# Patient Record
Sex: Female | Born: 1950 | Race: White | Hispanic: No | Smoking: Former smoker
Health system: Southern US, Community
[De-identification: ages and names within clinical notes are randomized; demographics above are authoritative.]

## PROBLEM LIST (undated history)

## (undated) DIAGNOSIS — M1990 Unspecified osteoarthritis, unspecified site: Secondary | ICD-10-CM

## (undated) DIAGNOSIS — L03116 Cellulitis of left lower limb: Secondary | ICD-10-CM

## (undated) DIAGNOSIS — L03115 Cellulitis of right lower limb: Secondary | ICD-10-CM

## (undated) DIAGNOSIS — F32A Depression, unspecified: Secondary | ICD-10-CM

## (undated) DIAGNOSIS — Z1371 Encounter for nonprocreative screening for genetic disease carrier status: Secondary | ICD-10-CM

## (undated) DIAGNOSIS — M75100 Unspecified rotator cuff tear or rupture of unspecified shoulder, not specified as traumatic: Secondary | ICD-10-CM

## (undated) DIAGNOSIS — E785 Hyperlipidemia, unspecified: Secondary | ICD-10-CM

## (undated) DIAGNOSIS — I509 Heart failure, unspecified: Secondary | ICD-10-CM

## (undated) DIAGNOSIS — M4800 Spinal stenosis, site unspecified: Secondary | ICD-10-CM

## (undated) DIAGNOSIS — C801 Malignant (primary) neoplasm, unspecified: Secondary | ICD-10-CM

## (undated) DIAGNOSIS — I10 Essential (primary) hypertension: Secondary | ICD-10-CM

## (undated) DIAGNOSIS — E119 Type 2 diabetes mellitus without complications: Secondary | ICD-10-CM

## (undated) DIAGNOSIS — R519 Headache, unspecified: Secondary | ICD-10-CM

## (undated) DIAGNOSIS — I2510 Atherosclerotic heart disease of native coronary artery without angina pectoris: Secondary | ICD-10-CM

## (undated) DIAGNOSIS — Z803 Family history of malignant neoplasm of breast: Secondary | ICD-10-CM

## (undated) DIAGNOSIS — Z972 Presence of dental prosthetic device (complete) (partial): Secondary | ICD-10-CM

## (undated) DIAGNOSIS — M797 Fibromyalgia: Secondary | ICD-10-CM

## (undated) DIAGNOSIS — F419 Anxiety disorder, unspecified: Secondary | ICD-10-CM

## (undated) DIAGNOSIS — Z8041 Family history of malignant neoplasm of ovary: Secondary | ICD-10-CM

## (undated) HISTORY — PX: HERNIA REPAIR: SHX51

## (undated) HISTORY — DX: Malignant (primary) neoplasm, unspecified: C80.1

## (undated) HISTORY — PX: ABLATION SAPHENOUS VEIN W/ RFA: SUR11

## (undated) HISTORY — PX: DILATION AND CURETTAGE OF UTERUS: SHX78

## (undated) HISTORY — DX: Encounter for nonprocreative screening for genetic disease carrier status: Z13.71

## (undated) HISTORY — DX: Essential (primary) hypertension: I10

## (undated) HISTORY — DX: Hyperlipidemia, unspecified: E78.5

## (undated) HISTORY — DX: Family history of malignant neoplasm of ovary: Z80.41

## (undated) HISTORY — DX: Family history of malignant neoplasm of breast: Z80.3

## (undated) HISTORY — DX: Heart failure, unspecified: I50.9

## (undated) HISTORY — PX: CORONARY ANGIOPLASTY: SHX604

## (undated) HISTORY — PX: COLON SURGERY: SHX602

## (undated) HISTORY — DX: Spinal stenosis, site unspecified: M48.00

---

## 2004-01-04 HISTORY — PX: ANAL FISSURECTOMY: SUR608

## 2005-11-21 ENCOUNTER — Ambulatory Visit: Payer: Self-pay | Admitting: Family Medicine

## 2008-05-18 ENCOUNTER — Ambulatory Visit: Payer: Self-pay | Admitting: Family Medicine

## 2008-05-31 ENCOUNTER — Ambulatory Visit: Payer: Self-pay | Admitting: Family Medicine

## 2008-06-15 ENCOUNTER — Ambulatory Visit: Payer: Self-pay | Admitting: Family Medicine

## 2008-07-09 ENCOUNTER — Ambulatory Visit: Payer: Self-pay | Admitting: General Surgery

## 2008-10-11 ENCOUNTER — Inpatient Hospital Stay: Payer: Self-pay | Admitting: Internal Medicine

## 2008-12-06 ENCOUNTER — Ambulatory Visit: Payer: Self-pay | Admitting: Family Medicine

## 2010-06-12 ENCOUNTER — Ambulatory Visit: Payer: Self-pay | Admitting: Family Medicine

## 2010-07-04 ENCOUNTER — Inpatient Hospital Stay: Payer: Self-pay | Admitting: Internal Medicine

## 2010-07-10 ENCOUNTER — Ambulatory Visit: Payer: Self-pay | Admitting: Internal Medicine

## 2010-07-13 ENCOUNTER — Ambulatory Visit: Payer: Self-pay

## 2010-07-20 ENCOUNTER — Ambulatory Visit: Payer: Self-pay | Admitting: Internal Medicine

## 2011-11-28 ENCOUNTER — Ambulatory Visit: Payer: Self-pay | Admitting: Family Medicine

## 2013-08-20 ENCOUNTER — Ambulatory Visit: Payer: Self-pay | Admitting: Unknown Physician Specialty

## 2013-09-02 ENCOUNTER — Ambulatory Visit: Payer: Self-pay | Admitting: Orthopedic Surgery

## 2013-09-11 ENCOUNTER — Ambulatory Visit: Payer: Self-pay | Admitting: Orthopedic Surgery

## 2013-11-04 ENCOUNTER — Ambulatory Visit: Payer: Self-pay | Admitting: Cardiology

## 2013-11-04 HISTORY — PX: OTHER SURGICAL HISTORY: SHX169

## 2013-11-04 LAB — CK TOTAL AND CKMB (NOT AT ARMC)
CK, Total: 62 U/L (ref 21–215)
CK-MB: 1.2 ng/mL (ref 0.5–3.6)

## 2013-11-05 LAB — BASIC METABOLIC PANEL
Anion Gap: 3 — ABNORMAL LOW (ref 7–16)
BUN: 10 mg/dL (ref 7–18)
Calcium, Total: 8.7 mg/dL (ref 8.5–10.1)
Chloride: 101 mmol/L (ref 98–107)
Co2: 32 mmol/L (ref 21–32)
Creatinine: 0.55 mg/dL — ABNORMAL LOW (ref 0.60–1.30)
EGFR (African American): >60
EGFR (Non-African Amer.): >60
Glucose: 145 mg/dL — ABNORMAL HIGH (ref 65–99)
Osmolality: 274 (ref 275–301)
Potassium: 4 mmol/L (ref 3.5–5.1)
Sodium: 136 mmol/L (ref 136–145)

## 2013-12-31 ENCOUNTER — Ambulatory Visit: Payer: Self-pay | Admitting: Unknown Physician Specialty

## 2014-01-06 HISTORY — PX: MEDIAL PARTIAL KNEE REPLACEMENT: SHX5965

## 2014-04-06 DIAGNOSIS — M069 Rheumatoid arthritis, unspecified: Secondary | ICD-10-CM | POA: Insufficient documentation

## 2014-04-06 DIAGNOSIS — M059 Rheumatoid arthritis with rheumatoid factor, unspecified: Secondary | ICD-10-CM | POA: Insufficient documentation

## 2014-04-08 DIAGNOSIS — I878 Other specified disorders of veins: Secondary | ICD-10-CM | POA: Insufficient documentation

## 2014-04-08 DIAGNOSIS — G43909 Migraine, unspecified, not intractable, without status migrainosus: Secondary | ICD-10-CM | POA: Insufficient documentation

## 2014-04-08 DIAGNOSIS — E669 Obesity, unspecified: Secondary | ICD-10-CM | POA: Insufficient documentation

## 2014-04-08 DIAGNOSIS — I2510 Atherosclerotic heart disease of native coronary artery without angina pectoris: Secondary | ICD-10-CM | POA: Insufficient documentation

## 2014-04-08 DIAGNOSIS — K279 Peptic ulcer, site unspecified, unspecified as acute or chronic, without hemorrhage or perforation: Secondary | ICD-10-CM | POA: Insufficient documentation

## 2014-04-08 DIAGNOSIS — J449 Chronic obstructive pulmonary disease, unspecified: Secondary | ICD-10-CM | POA: Insufficient documentation

## 2014-04-08 DIAGNOSIS — E118 Type 2 diabetes mellitus with unspecified complications: Secondary | ICD-10-CM | POA: Insufficient documentation

## 2014-04-08 DIAGNOSIS — M1990 Unspecified osteoarthritis, unspecified site: Secondary | ICD-10-CM | POA: Insufficient documentation

## 2014-04-08 DIAGNOSIS — E119 Type 2 diabetes mellitus without complications: Secondary | ICD-10-CM | POA: Insufficient documentation

## 2014-07-26 DIAGNOSIS — Z79899 Other long term (current) drug therapy: Secondary | ICD-10-CM | POA: Insufficient documentation

## 2014-08-30 ENCOUNTER — Inpatient Hospital Stay: Payer: Self-pay | Admitting: Internal Medicine

## 2014-08-30 LAB — CREATININE, SERUM
Creatinine: 0.73 mg/dL (ref 0.60–1.30)
EGFR (African American): >60
EGFR (Non-African Amer.): >60

## 2014-08-31 LAB — CBC WITH DIFFERENTIAL/PLATELET
Basophil #: 0 10*3/uL (ref 0.0–0.1)
Basophil %: 0.8 %
Eosinophil #: 0.2 10*3/uL (ref 0.0–0.7)
Eosinophil %: 4 %
HCT: 33.9 % — ABNORMAL LOW (ref 35.0–47.0)
HGB: 11 g/dL — ABNORMAL LOW (ref 12.0–16.0)
Lymphocyte #: 1.4 10*3/uL (ref 1.0–3.6)
Lymphocyte %: 25.2 %
MCH: 28 pg (ref 26.0–34.0)
MCHC: 32.5 g/dL (ref 32.0–36.0)
MCV: 86 fL (ref 80–100)
Monocyte #: 0.5 x10 3/mm (ref 0.2–0.9)
Monocyte %: 8.6 %
Neutrophil #: 3.5 10*3/uL (ref 1.4–6.5)
Neutrophil %: 61.4 %
Platelet: 230 10*3/uL (ref 150–440)
RBC: 3.94 10*6/uL (ref 3.80–5.20)
RDW: 16.3 % — ABNORMAL HIGH (ref 11.5–14.5)
WBC: 5.6 10*3/uL (ref 3.6–11.0)

## 2014-08-31 LAB — BASIC METABOLIC PANEL
Anion Gap: 6 — ABNORMAL LOW (ref 7–16)
BUN: 12 mg/dL (ref 7–18)
Calcium, Total: 8.2 mg/dL — ABNORMAL LOW (ref 8.5–10.1)
Chloride: 104 mmol/L (ref 98–107)
Co2: 28 mmol/L (ref 21–32)
Creatinine: 0.63 mg/dL (ref 0.60–1.30)
EGFR (African American): >60
EGFR (Non-African Amer.): >60
Glucose: 116 mg/dL — ABNORMAL HIGH (ref 65–99)
Osmolality: 276 (ref 275–301)
Potassium: 4 mmol/L (ref 3.5–5.1)
Sodium: 138 mmol/L (ref 136–145)

## 2014-09-04 LAB — CULTURE, BLOOD (SINGLE)

## 2014-10-05 ENCOUNTER — Encounter: Payer: Self-pay | Admitting: General Surgery

## 2014-10-09 LAB — WOUND AEROBIC CULTURE

## 2014-10-20 ENCOUNTER — Ambulatory Visit: Payer: Self-pay | Admitting: Family Medicine

## 2014-11-02 ENCOUNTER — Encounter: Payer: Self-pay | Admitting: General Surgery

## 2015-03-25 NOTE — Discharge Summary (Signed)
PATIENT NAME:  [name] [name], [name] [name] MR#:  [id number] DATE OF BIRTH:  [birth year]  DATE OF ADMISSION:  11/04/2013 DATE OF DISCHARGE: 11/05/2013   FINAL DIAGNOSES:  1.  Coronary artery disease.  2.  Hyperlipidemia.  3.  Diabetes.  4.  Rheumatoid arthritis.   DISCHARGE MEDICATIONS: Aspirin 81 mg daily, clopidogrel 75 mg daily, atorvastatin 10 mg at bedtime, citalopram 20 mg daily, methotrexate 2.5 mg, Zyrtec 10 mg daily, glipizide 2.5 mg daily and folic acid 1 mg daily.   PROCEDURES:  1.  Cardiac catheterization with selective coronary arteriography 11/04/2013.  2.  Percutaneous coronary intervention with coronary stent 11/04/2013.   HISTORY OF PRESENT ILLNESS: Please see admission H and [place] COURSE: The patient underwent elective cardiac catheterization on 11/04/2013. Coronary arteriography revealed an 80% stenosis in the proximal segment of the right coronary artery. The patient underwent percutaneous coronary intervention receiving a 3.5 x 18 mm bare metal stent. Dilatation was performed with 4.0 x 15 mm noncompliant balloon. The patient had excellent angiographic result. She was admitted overnight to the CCU where she had an uncomplicated hospital course without chest pain. Postoperative CPK and MB were 62 and 1.2, respectively. BUN and creatinine on the morning of discharge was 10 and 0.55, respectively. The patient was ambulating without difficulty and was discharged home in stable condition. She is scheduled to see me in followup in 1 to 2 weeks.   ____________________________ Isaias Cowman, MD ap:aw D: 11/05/2013 07:29:50 ET T: 11/05/2013 07:37:22 ET JOB#: [id number]  cc: Isaias Cowman, MD, <Dictator> Isaias Cowman MD ELECTRONICALLY SIGNED 11/20/2013 8:51

## 2015-03-26 NOTE — Discharge Summary (Signed)
PATIENT NAME:  [name] [name], VASCONEZ MR#:  [id number] DATE OF BIRTH:  Jan 15, 1951  DATE OF ADMISSION:  08/30/2014 DATE OF DISCHARGE:  09/01/2014  DISCHARGE DIAGNOSIS: Left lower extremity cellulitis, improving.   SECONDARY DIAGNOSES: 1. Diabetes.  2. Coronary artery disease, status post stenting.  3. Hyperlipidemia.  4. Rheumatoid arthritis.  5. Depression.  6. Peripheral arterial disease.  7. Chronic lower extremity edema.  8. Bladder incontinence.   CONSULTATIONS: Vascular Surgery, Dr. Leotis Pain.   PROCEDURES AND RADIOLOGY: Left lower extremity Doppler on the 28th of September showed no evidence of deep vein thrombosis.   MAJOR LABORATORY PANEL: Blood culture x 1 was negative.   HISTORY AND SHORT HOSPITAL COURSE: The patient is a 64 year old female with the above-mentioned medical problems, who was admitted for left lower extremity cellulitis. Please see admitting Dr's dictated history and physical for further details. The patient supposedly failed outpatient antibiotics and was switched to intravenous antibiotics while in the hospital. Vascular surgery consultation was obtained with Dr. Leotis Pain as there was some concern for underlying peripheral vascular disease. The patient's venous stasis was thought to be chronic per vascular surgery. She certainly had lymphedema pump at home, which was in use. The patient was also encouraged for weight loss, leg elevation and activity, which all could benefit.  The patient was also instructed to consider using Ulna boots and lymphedema pump. She was recommended to get outpatient work-up for arterial studies with vascular surgery. Her cellulitis was improving and was feeling much better by September 30 and was discharged home in stable condition.   PHYSICAL EXAMINATION: VITAL SIGNS: On the date of discharge, her vital signs are as follows: Temperature 97.9, heart rate 67 per minute, respirations 17 per minute, blood pressure 124/73. She was saturating 93% on  room air. Pertinent physical examination on the date of discharge:  CARDIOVASCULAR: S1, S2 normal. No murmurs, rubs, or gallops.  LUNGS: Clear to auscultation bilaterally. No wheezing, rales, rhonchi, or crepitation.  ABDOMEN: Soft, benign.  NEUROLOGIC: Nonfocal examination. Her left lower extremity had minimal erythema below mid shin. No obvious draining ulcer. No obvious draining area or abscess. All other physical examination remained at the baseline.   DISCHARGE MEDICATIONS: 1. Acetaminophen/hydrocodone 325/5 mg 1 tablet every 6 hours as needed.  2. Citalopram 20 mg 2 tablets p.o. daily.  3. Zyrtec 10 mg p.o. daily. 4. Glipizide 2.5 mg p.o. daily. 5. Folic acid 1 mg  p.o. daily.  6. Aspirin 81 mg p.o. daily. 7.  Ramicade intravenous as scheduled and instructed by her rheumatologist.  8. Methotrexate 2.5 mg p.o. once a week on every Wednesday.  9. Levaquin 500 mg p.o. daily for a total of 10 days. 10. Doxycycline 100 mg tablet orally twice a day for 10 days.   DISCHARGE DIET: Low sodium.   Please note, originally doxycycline was prescribed 150 mg, but her insurance will only approve 400, so the prescription was changed.   DISCHARGE ACTIVITY: As tolerated.   DISCHARGE INSTRUCTIONS AND FOLLOWUP. The patient was instructed to follow up with her primary care physician, Dr. Margarita Rana in 2 to 4 weeks. She was instructed to follow up with infectious disease, Dr. [name] Prows, in 1 to 2 weeks.   TOTAL TIME DISCHARGING THIS PATIENT: 55 minutes.    ____________________________ Lucina Mellow. Manuella Ghazi, MD vss:hh D: 09/02/2014 23:38:36 ET T: 09/03/2014 00:11:58 ET JOB#: [id number]  cc: Chelsae Zanella S. Manuella Ghazi, MD, <Dictator> Cheral Marker. Ola Spurr, MD Jerrell Belfast, MD Algernon Huxley, MD Mylez Venable Chauncey Cruel  [facility] MD ELECTRONICALLY SIGNED 09/07/2014 16:00

## 2015-03-26 NOTE — H&P (Signed)
PATIENT NAME:  [name] [name], CAPEK MR#:  [id number] DATE OF BIRTH:  01/01/1951  DATE OF ADMISSION:  08/30/2014  PRIMARY CARE PHYSICIAN: Dr. Venia Minks.   CHIEF COMPLAINT: Failed outpatient treatment for left lower extremity cellulitis.   HISTORY OF PRESENT ILLNESS:  A 64 year old female with diabetes, hyperlipidemia, PAD, rheumatoid arthritis, who presents with the above complaint. The patient was sent from her primary care physician because she failed outpatient treatment for her cellulitis. She has been treated for the past 3 weeks on Augmentin  and doxycycline and the cellulitis is not improving. She denies any fever or chills.   PAST MEDICAL HISTORY:   1.  Diabetes.   2.  CAD with stent.  3.  Hyperlipidemia.  4.  History of rheumatoid arthritis.   5.  Depression.  6.  Bladder incontinence.  7.  History of PAD.  8.  Chronic lower extremity edema.    MEDICATIONS:  1.  Remicade IV.  2.  Methotrexate 2.5 daily.  3.  Celexa 40 mg daily.  4.  Folic acid 1 mg daily.  5.  Vesicare 5 mg daily.   6.  Glipizide 2.5 daily.  7.  Aspirin 81 mg daily.    REVIEW OF SYSTEMS:    CONSTITUTIONAL:  No fever, chills, weight loss or gain.  HEENT: No blurred or double vision. No tinnitus, postnasal drip, redness of oropharynx  RESPIRATORY:  No cough, wheezing, hemoptysis, COPD.   CARDIOVASCULAR:  No chest pain, orthopnea,  GASTROINTESTINAL: No nausea, vomiting, diarrhea, abdominal pain, melena, or ulcers.   GENITOURINARY:  No dysuria, hematuria.   ENDOCRINE: No polyuria, polydipsia.  No heat or cold intolerance.  HEMATOLOGIC AND LYMPHATIC: No anemia, easy bruising, bleeding.  SKIN: She has got a cellulitis of her lower extremity.   MUSCULOSKELETAL: Positive RA. No gout.  NEUROLOGIC: No history of CVA, TIA, or seizures.  PSYCHIATRIC: No history of anxiety. Positive depression.   ALLERGIES:  1.  SULFA CAUSES HIVES.  2.  LATEX CAUSES ITCHING.   SOCIAL HISTORY: The patient quit smoking about 15 years  ago. No alcohol or IV drug use.   FAMILY HISTORY: Positive for hypertension, dementia, COPD, cancer, brain aneurysm.   PAST SURGICAL HISTORY:  1.  Partial knee in February of 2015.  2.  Cardiac stent in December 2014.   PHYSICAL EXAMINATION:  VITAL SIGNS: Temperature 97.9, pulse 85, respirations 18, blood pressure 140/83, 97% on room air.  GENERAL: The patient is alert, oriented, not in acute distress.  HEENT: Head is atraumatic.  Pupils are reactive.  Sclerae are anicteric.  Mucus membranes are  moist. Oropharynx is clear.  NECK: Supple without JVD, carotid bruits,  CARDIOVASCULAR:  Regular rate and rhythm.   No murmurs, gallops, rubs.  PMI is not displaced.   LUNGS: Clear to auscultation without crackles, rales, rhonchi, or wheezing. Normal to percussion.  ABDOMEN:  Bowel sounds positive. Nontender, nondistended. No hepatosplenomegaly.  EXTREMITIES: She has got 3 + pitting edema in the left lower extremity.  SKIN: The patient has a large area of cellulitis left lower extremity with an ulcer about 2 cm x 2 cm on her shin without any drainage. Her left leg is tender to touch and is warm and erythematous.  NEUROLOGIC:  Cranial nerves II-XII are intact. There are no focal deficits.   LABORATORY DATA: No laboratories.   ASSESSMENT AND PLAN:  This is a 64 year old female who failed outpatient treatment on Augmentin and doxycycline for left lower extremity cellulitis, who was admitted for IV  antibiotics and further evaluation   1.  Left lower extremity cellulitis. The patient will be placed on vancomycin and Zosyn. We will continue to elevate her leg.  I am concerned that she could have a component of PAD, so I have asked Dr. Delana Meyer to see the patient in consultation. She will also have a lower extremity Doppler to rule out a DVT.   2.  Diabetes. She will continue her glipizide, ADA diet, and sliding scale insulin.  3.  History of rheumatoid arthritis.  We will continue her daily methotrexate.  We will continue her folic acid.   4.  Depression. Continue Celexa.   5.  History of coronary artery disease.  We will continue on aspirin. Unclear why the patient is not on a beta blocker or cholesterol medication, this can be evaluated while the patient is here or deferred as outpatient.   CODE STATUS: The patient is full code.    TIME SPENT:  Approximately 45 minutes.     ____________________________ Donell Beers. Benjie Karvonen, MD spm:bu D: 08/30/2014 15:00:02 ET T: 08/30/2014 15:49:24 ET JOB#: [id number]  cc: Darria Corvera P. Benjie Karvonen, MD, <Dictator> Jerrell Belfast, MD Donell Beers Ashon Rosenberg MD ELECTRONICALLY SIGNED 08/30/2014 17:10

## 2015-03-26 NOTE — Consult Note (Signed)
CHIEF COMPLAINT and HISTORY:  Subjective/Chief Complaint left leg cellulitis/ulcer   History of Present Illness Patient presents to the hospital for recurrent ulceration and cellulitis of the LLE.  She has had problems with chronic pain and swelling in her leg, and has had multiple previous episodes of cellulitis to the left leg.  She has venous disease on the right, but no ulceration or infection.  Has undergone venous intervention bilaterally previously and has seen Dr. Delana Meyer, although not in some time.  Not clear if she has had any arterial evaluations.  Has DM and RA.  This ulcer and infection started after a treatment with Remicade.  Started only 24 hours after that infusion.  Has failed outpatient therapy over several weeks, so now in the hospital for IV ABx.  Ulcer is on anterior shin/lower calf area.  Redness is circumferential throughout the calf and ankle area.  Is painful, and has not improved.  Tried [employer] as outpatient, but they were painful and she had to remove them.   PAST MEDICAL/SURGICAL HISTORY:  Past Medical History:   Fibromyalgia:    Hyperlipidemia:    Diabetes Mellitus, Type II (NIDD):    Depression:    Hypertension:    cellulitis:    Rheumatoid Arthritis:   ALLERGIES:  Allergies:  Sulfa drugs: Rash  HOME MEDICATIONS:  Home Medications: Medication Instructions Status  aspirin 81 mg oral tablet, chewable 1 tab(s) orally once a day Active  citalopram tablet 20 mg 2 tab(s)  once a day  Active  methotrexate 2.5 mg oral tablet  orally once a day Active  ZyrTEC 10 mg oral tablet 1 tab(s) orally once a day Active  glipiZIDE extended release 2.5 mg oral tablet, extended release 1 tab(s) orally once a day Active  folic acid 1 mg oral tablet 1 tab(s) orally once a day Active  Remicade  intravenous  Active  acetaminophen-hydrocodone 325 mg-5 mg oral tablet 1  orally every 6 hours as needed   Active  glucometer  Active  glucos strips Check blood sugar at least  3 times daily before meals Active   Family and Social History:  Family History Non-Contributory   Social History negative tobacco, negative ETOH, negative Illicit drugs   Place of Living Home   Review of Systems:  Subjective/Chief Complaint skin changes as above leg swelling chronic. No TIA/stroke/seizure No heat or cold intolerance No dysuria/hematuria No blurry or double vision No tinnitus or ear pain   Fever/Chills Yes   Cough No   Sputum No   Abdominal Pain No   Diarrhea No   Constipation No   Nausea/Vomiting No   SOB/DOE No   Chest Pain No   Telemetry Reviewed NSR   Dysuria No   Tolerating PT Yes   Tolerating Diet Yes   Medications/Allergies Reviewed Medications/Allergies reviewed   Physical Exam:  GEN well developed, well nourished, obese   HEENT pink conjunctivae, hearing intact to voice, moist oral mucosa   NECK No masses  trachea midline   RESP normal resp effort  no use of accessory muscles   CARD regular rate  no JVD   VASCULAR ACCESS none   ABD denies tenderness  soft   GU no superpubic tenderness   LYMPH negative neck, negative axillae   EXTR negative cyanosis/clubbing, positive edema, stasis changes present bilaterally and moderate to severe.  RLE swelling 1-2+, LLE swellilng 2-3+.  Erythema LLE and ulcer as below.  Pedal pulses 2+ DP on right, 1-2+ DP on left.  Right PT 1+, left PT trace.   SKIN positive ulcers, 2 cm round ulcer left anterior shin/calf area.  Surrounding erythema   NEURO cranial nerves intact, negative tremor, motor/sensory function intact   PSYCH alert, A+O to time, place, person   LABS:  Laboratory Results: LabObservation:    28-Sep-15 16:00, [name] Color Flow Doppler Low Extrem Left (Leg)  OBSERVATION   Reason for Test Edema  Routine Chem:    28-Sep-15 14:14, Creatinine Serum  Creatinine (comp) 0.73  eGFR (African American) >60  eGFR (Non-African American) >60  eGFR values <74m/min/1.73 m2 may be an  indication of chronic  kidney disease (CKD).  Calculated eGFR, using the MRDR Study equation, is useful in   patients with stable renal function.  The eGFR calculation will not be reliable in acutely ill patients  when serum creatinine is changing rapidly. It is not useful in  patients on dialysis. The eGFR calculation may not be applicable  to patients at the low and high extremes of body sizes, pregnant  women, and vetetarians.   RADIOLOGY:  Radiology Results: LabUnknown:    01-Oct-14 16:47, KDRBILATKNEE  KDRBILATKNEE  REASON FOR EXAM:    Knee pain  COMMENTS:       PROCEDURE: KDR - KDXR BILATERAL AP/LAT KNEES  - Sep 02 2013  4:47PM     RESULT: Lateral standing views of both knees reveal narrowing of the   medial joint compartments. There is very mild degenerative change of the   patellofemoral compartments. No joint effusion is evident. There is no   acute fracture.    IMPRESSION:  There are degenerative changes of the medial joint   compartment and to a lesser extent of the patellofemoral joint   compartment bilaterally.     Dictation Site: 5    Verified By: DAVID A. JMartinique M.D., MD  PACS Image    01-Oct-14 16:47, KDRBILATKNEESTA  KDRBILATKNEESTA  REASON FOR EXAM:    Knee pain  COMMENTS:       PROCEDURE: KDR - KDXR BILATERAL KNEES STANDING  - Sep 02 2013  4:47PM     RESULT: Standing AP and PA flexion views of the knees reveal the bones to   be reasonably well mineralized. There is narrowing ofthe medial joint   compartments bilaterally. There is beaking of the tibial spines   bilaterally. There is no evidence of an acute fracture.    IMPRESSION:  There is moderate degenerative change of both knees   predominantly involving the medial joint compartments.     Dictation Site: 5    Verified By: DAVID A. JMartinique M.D., MD  PACS Image    10-Oct-14 09:12, MRI Knee Left Without Contrast  PACS Image    29-Jan-15 10:20, CT Knee Left Without Contrast  PACS Image  MRI:     10-Oct-14 09:12, MRI Knee Left Without Contrast  MRI Knee Left Without Contrast  REASON FOR EXAM:    Left Knee Pain Eval Stress Fx  COMMENTS:       PROCEDURE: MMR - MMR KNEE LT WO CONTRAST  - Sep 11 2013  9:12AM     RESULT: History: Left knee pain    Comparison: None    Technique: Multiplanar and multisequence MRI of the right  knee was   performed without intravenous contrast.    FINDINGS:    MENISCI  Medial: There is a radial tear of the body of the medial meniscus.     Lateral: There is a tiny radial  tear the free edge of the body of the   lateral meniscus.[name]    LIGAMENTS    Cruciates: ACL Intact. PCL Intact.    Collaterals: MCL Intact. Lateral collateral ligament complex Intact.    CARTILAGE    Patellofemoral: There is partial thickness cartilage loss of the patellar   apex..  Medial compartment: There is full-thickness cartilage loss of the medial   femoral condyle and medial tibial plateau with subchondral reactive   changes. There is marginal osteophytosis..    Lateral compartment: Intact.    BONES: No marrow signal abnormality.    JOINT: There is a small joint effusion. Normal Hoffa's fat-pad. No plical   thickening.    EXTENSOR MECHANISM: Intact.    POPLITEAL FOSSA:  Popliteus intact. There is a moderate size complex   Baker's cyst with intermediate signal material within the Baker's cyst     which may reflect synovitis versus loose bodies. A component of the   Baker's cyst extends more medially adjacent to the popliteal vessels.    IMPRESSION:      1. There is a radial tear of the body of the medial meniscus.     2. There is a tiny radial tear the free edge of the body of the lateral   meniscus.    3. Cartilage abnormalities as described above.    4. Complex Baker's cyst.      Verified By: Jennette Banker, M.D., MD  CT:    29-Jan-15 10:20, CT Knee Left Without Contrast  CT Knee Left Without Contrast  REASON FOR EXAM:    MAKO   Osteoarthritis  COMMENTS:       PROCEDURE: CT  - CT KNEE LEFT WO  - Dec 31 2013 10:20AM     CLINICAL DATA:  Osteoarthritis. Surgical planning CT of the knee.  [place]ako protocol.    EXAM:  CT OF THE LEFT KNEE WITHOUT CONTRAST    TECHNIQUE:  Multidetector CT imaging was performed according to the MAKO  protocol. Multiplanar CT image reconstructions were also generated.  COMPARISON:  09/11/2013.    FINDINGS:  Body habitus reduces diagnostic sensitivity and specificity. Both  bone window images the left hip demonstrate mild degenerative loss  of articular space. At bone window images the left ankle show mild  midfoot degenerative arthropathy in subcutaneous edema overlying the  malleoli. Type 1 accessory navicular.    Moderate joint effusion. Markedly severe loss of articular space in  the medial compartment was subcortical sclerosis and degenerative  subcortical lesions. Tricompartmental spurring. Moderate  degenerative loss of articular cartilage space in the lateral  compartment and patellofemoral joint  A moderate-sized Baker's cyst is present     IMPRESSION:  1. Severe osteoarthritis of the left knee. Moderate knee joint  effusion.  2. Moderate size Baker's cyst.  3. Mild degenerative articular space loss in theleft hip.  4. Mild left midfoot degenerative arthropathy.      Electronically Signed    By: Sherryl Barters M.D.    On: 12/31/2013 11:44       Verified By: Carron Curie, M.D.,   ASSESSMENT AND PLAN:  Assessment/Admission Diagnosis recurrent LLE ulceration and cellulitis, failed outpatient therapy Chronic venous disease of the LE, s/p superficial venous ablation Obesity DM RA   Plan The patient has multiple issues hampering her wound healing and cellulitis.  Her RA (and medications for RA moreso), DM, and obesity all make healing her wound more difficult.  Has palpable pedal pulses, but could  still have small vessel disease from DM and other issues.   Will check her outpatient record to see if any arterial studies have been done.  May still need an angiogram for full determination, but this may be normal. Her venous disease is chronic, and once her infection is under better control she needs more compression.  Already has a lymphedema pump and once infection cleared should resume that.  Elevation, weight loss, and activity all of benefit.  Her inpatient study was limited but no new DVT seen per [name] tech report.  Will check final reading and will see when last outpatient reflux studies were done. Elevate legs as tolerated Continue IV ABx. Will follow    level 4   Electronic Signatures: Algernon Huxley (MD)  (Signed 28-Sep-15 16:23)  Authored: Chief Complaint and History, PAST MEDICAL/SURGICAL HISTORY, ALLERGIES, HOME MEDICATIONS, Family and Social History, Review of Systems, Physical Exam, LABS, RADIOLOGY, Assessment and Plan   Last Updated: 28-Sep-15 16:23 by Algernon Huxley (MD)

## 2015-05-11 ENCOUNTER — Other Ambulatory Visit: Payer: Self-pay | Admitting: Family Medicine

## 2015-05-11 MED ORDER — OXYCODONE-ACETAMINOPHEN 10-325 MG PO TABS: 1.0000 | ORAL_TABLET | ORAL | Status: DC | PRN

## 2015-05-11 NOTE — Telephone Encounter (Signed)
Pt is requesting a refill for Rx Oxycodone 10-325.  IW#[phone]/[id number]

## 2015-05-11 NOTE — Telephone Encounter (Signed)
Pt advised.   Thanks,   -Laura  

## 2015-05-11 NOTE — Telephone Encounter (Signed)
Will you refill? Oxycodone-APAP 5-325 1 tab 4 times daily prn. #120. Renaldo Fiddler, CMA

## 2015-06-08 ENCOUNTER — Other Ambulatory Visit: Payer: Self-pay | Admitting: Family Medicine

## 2015-06-08 DIAGNOSIS — M797 Fibromyalgia: Secondary | ICD-10-CM

## 2015-06-08 MED ORDER — OXYCODONE-ACETAMINOPHEN 10-325 MG PO TABS
1.0000 | ORAL_TABLET | ORAL | Status: DC | PRN
Start: 2015-06-08 — End: 2015-07-11

## 2015-06-08 NOTE — Telephone Encounter (Signed)
Last refill 05/11/2015. Renaldo Fiddler, CMA

## 2015-06-08 NOTE — Telephone Encounter (Signed)
Pt contacted office for refill request on the following medications:  oxyCODONE-acetaminophen (PERCOCET) 10-325 MG.  IR#[phone]/[id number]

## 2015-06-08 NOTE — Telephone Encounter (Signed)
Prescription printed. Please notify patient it is ready for pick up. Thanks- Dr. Merin Borjon.  

## 2015-07-06 ENCOUNTER — Telehealth: Payer: Self-pay | Admitting: Family Medicine

## 2015-07-06 DIAGNOSIS — Z8711 Personal history of peptic ulcer disease: Secondary | ICD-10-CM | POA: Insufficient documentation

## 2015-07-06 DIAGNOSIS — M1990 Unspecified osteoarthritis, unspecified site: Secondary | ICD-10-CM | POA: Insufficient documentation

## 2015-07-06 DIAGNOSIS — L97909 Non-pressure chronic ulcer of unspecified part of unspecified lower leg with unspecified severity: Secondary | ICD-10-CM | POA: Insufficient documentation

## 2015-07-06 DIAGNOSIS — I83003 Varicose veins of unspecified lower extremity with ulcer of ankle: Secondary | ICD-10-CM | POA: Insufficient documentation

## 2015-07-06 DIAGNOSIS — Z8719 Personal history of other diseases of the digestive system: Secondary | ICD-10-CM | POA: Insufficient documentation

## 2015-07-06 DIAGNOSIS — F4320 Adjustment disorder, unspecified: Secondary | ICD-10-CM | POA: Insufficient documentation

## 2015-07-06 DIAGNOSIS — M4800 Spinal stenosis, site unspecified: Secondary | ICD-10-CM | POA: Insufficient documentation

## 2015-07-06 DIAGNOSIS — M0579 Rheumatoid arthritis with rheumatoid factor of multiple sites without organ or systems involvement: Secondary | ICD-10-CM | POA: Insufficient documentation

## 2015-07-06 DIAGNOSIS — E7800 Pure hypercholesterolemia, unspecified: Secondary | ICD-10-CM | POA: Insufficient documentation

## 2015-07-06 DIAGNOSIS — R609 Edema, unspecified: Secondary | ICD-10-CM | POA: Insufficient documentation

## 2015-07-06 DIAGNOSIS — J309 Allergic rhinitis, unspecified: Secondary | ICD-10-CM | POA: Insufficient documentation

## 2015-07-06 DIAGNOSIS — M797 Fibromyalgia: Secondary | ICD-10-CM | POA: Insufficient documentation

## 2015-07-06 DIAGNOSIS — L97309 Non-pressure chronic ulcer of unspecified ankle with unspecified severity: Secondary | ICD-10-CM

## 2015-07-06 DIAGNOSIS — K259 Gastric ulcer, unspecified as acute or chronic, without hemorrhage or perforation: Secondary | ICD-10-CM | POA: Insufficient documentation

## 2015-07-06 DIAGNOSIS — I2510 Atherosclerotic heart disease of native coronary artery without angina pectoris: Secondary | ICD-10-CM | POA: Insufficient documentation

## 2015-07-06 DIAGNOSIS — I872 Venous insufficiency (chronic) (peripheral): Secondary | ICD-10-CM | POA: Insufficient documentation

## 2015-07-06 DIAGNOSIS — M25569 Pain in unspecified knee: Secondary | ICD-10-CM | POA: Insufficient documentation

## 2015-07-06 DIAGNOSIS — Z955 Presence of coronary angioplasty implant and graft: Secondary | ICD-10-CM | POA: Insufficient documentation

## 2015-07-06 DIAGNOSIS — R0602 Shortness of breath: Secondary | ICD-10-CM | POA: Insufficient documentation

## 2015-07-06 DIAGNOSIS — K649 Unspecified hemorrhoids: Secondary | ICD-10-CM | POA: Insufficient documentation

## 2015-07-06 DIAGNOSIS — L97302 Non-pressure chronic ulcer of unspecified ankle with fat layer exposed: Secondary | ICD-10-CM | POA: Insufficient documentation

## 2015-07-06 DIAGNOSIS — R2681 Unsteadiness on feet: Secondary | ICD-10-CM | POA: Insufficient documentation

## 2015-07-06 DIAGNOSIS — L03119 Cellulitis of unspecified part of limb: Secondary | ICD-10-CM | POA: Insufficient documentation

## 2015-07-06 DIAGNOSIS — I10 Essential (primary) hypertension: Secondary | ICD-10-CM | POA: Insufficient documentation

## 2015-07-06 DIAGNOSIS — E668 Other obesity: Secondary | ICD-10-CM | POA: Insufficient documentation

## 2015-07-06 DIAGNOSIS — M069 Rheumatoid arthritis, unspecified: Secondary | ICD-10-CM | POA: Insufficient documentation

## 2015-07-06 DIAGNOSIS — Q792 Exomphalos: Secondary | ICD-10-CM | POA: Insufficient documentation

## 2015-07-06 DIAGNOSIS — F329 Major depressive disorder, single episode, unspecified: Secondary | ICD-10-CM | POA: Insufficient documentation

## 2015-07-06 NOTE — Telephone Encounter (Signed)
Pt states the Rx for glipiZIDE (GLUCOTROL XL) 2.5 MG 24 hr tablet is on a national back order per [facility].  Pt is asking for a Rx to get something different.  [facility].  [phone]

## 2015-07-06 NOTE — Telephone Encounter (Signed)
Please call pharmacy and see what option they have . Thanks.

## 2015-07-06 NOTE — Telephone Encounter (Signed)
Great. Can send in rx for Glipizide 5 mg 1/2 bid until follow up if patient is out. Thanks.

## 2015-07-06 NOTE — Telephone Encounter (Signed)
Pt is overdue for a follow up visit.  We scheduled it for 07/11/2015 at 11am.   Thanks,   -Mickel Baas

## 2015-07-11 ENCOUNTER — Other Ambulatory Visit: Payer: Self-pay | Admitting: Family Medicine

## 2015-07-11 ENCOUNTER — Ambulatory Visit: Payer: Self-pay | Admitting: Family Medicine

## 2015-07-11 DIAGNOSIS — M797 Fibromyalgia: Secondary | ICD-10-CM

## 2015-07-11 MED ORDER — OXYCODONE-ACETAMINOPHEN 10-325 MG PO TABS: 1.0000 | ORAL_TABLET | ORAL | Status: DC | PRN

## 2015-07-11 NOTE — Telephone Encounter (Signed)
oxyCODONE-acetaminophen (PERCOCET) 10-325 MG per tablet 06/08/15 -- Margarita Rana, MD Take 1 tablet by mouth every 4 (four) hours as needed.  Pt wants to picks up refill today.  Thanks, [employer]

## 2015-07-11 NOTE — Telephone Encounter (Signed)
Prescription printed. Please notify patient it is ready for pick up. Thanks- Dr. Alphonso Gregson.  

## 2015-07-13 ENCOUNTER — Ambulatory Visit: Payer: Self-pay | Admitting: Family Medicine

## 2015-08-09 ENCOUNTER — Other Ambulatory Visit: Payer: Self-pay | Admitting: Family Medicine

## 2015-08-09 DIAGNOSIS — M797 Fibromyalgia: Secondary | ICD-10-CM

## 2015-08-09 MED ORDER — OXYCODONE-ACETAMINOPHEN 10-325 MG PO TABS: 1.0000 | ORAL_TABLET | ORAL | Status: DC | PRN

## 2015-08-09 NOTE — Telephone Encounter (Signed)
Last OV 11/2014  Thanks,   -Laura  

## 2015-08-09 NOTE — Telephone Encounter (Signed)
Prescription printed. Please notify patient it is ready for pick up. Needs OV.  Thanks- Dr. Venia Minks.

## 2015-08-09 NOTE — Telephone Encounter (Signed)
Pt contacted office for refill request on the following medications:  oxyCODONE-acetaminophen (PERCOCET) 10-325 MG.  FP#[phone]/[id number]

## 2015-08-10 ENCOUNTER — Telehealth: Payer: Self-pay | Admitting: Family Medicine

## 2015-09-04 ENCOUNTER — Inpatient Hospital Stay
Admission: EM | Admit: 2015-09-04 | Discharge: 2015-09-06 | DRG: 354 | Disposition: A | Discharge status: Home / Self Care | Payer: Medicaid Other | Attending: Surgery | Admitting: Surgery

## 2015-09-04 ENCOUNTER — Emergency Department: Payer: Medicaid Other

## 2015-09-04 ENCOUNTER — Encounter: Payer: Self-pay | Admitting: *Deleted

## 2015-09-04 DIAGNOSIS — I248 Other forms of acute ischemic heart disease: Secondary | ICD-10-CM | POA: Diagnosis not present

## 2015-09-04 DIAGNOSIS — M797 Fibromyalgia: Secondary | ICD-10-CM | POA: Diagnosis present

## 2015-09-04 DIAGNOSIS — E1151 Type 2 diabetes mellitus with diabetic peripheral angiopathy without gangrene: Secondary | ICD-10-CM | POA: Diagnosis present

## 2015-09-04 DIAGNOSIS — L03115 Cellulitis of right lower limb: Secondary | ICD-10-CM | POA: Insufficient documentation

## 2015-09-04 DIAGNOSIS — L03116 Cellulitis of left lower limb: Secondary | ICD-10-CM | POA: Diagnosis present

## 2015-09-04 DIAGNOSIS — M069 Rheumatoid arthritis, unspecified: Secondary | ICD-10-CM | POA: Diagnosis present

## 2015-09-04 DIAGNOSIS — F418 Other specified anxiety disorders: Secondary | ICD-10-CM | POA: Diagnosis present

## 2015-09-04 DIAGNOSIS — Z6841 Body Mass Index (BMI) 40.0 and over, adult: Secondary | ICD-10-CM

## 2015-09-04 DIAGNOSIS — Z792 Long term (current) use of antibiotics: Secondary | ICD-10-CM

## 2015-09-04 DIAGNOSIS — E7800 Pure hypercholesterolemia, unspecified: Secondary | ICD-10-CM | POA: Diagnosis present

## 2015-09-04 DIAGNOSIS — K460 Unspecified abdominal hernia with obstruction, without gangrene: Secondary | ICD-10-CM

## 2015-09-04 DIAGNOSIS — Z881 Allergy status to other antibiotic agents status: Secondary | ICD-10-CM

## 2015-09-04 DIAGNOSIS — Z91040 Latex allergy status: Secondary | ICD-10-CM

## 2015-09-04 DIAGNOSIS — Z79899 Other long term (current) drug therapy: Secondary | ICD-10-CM

## 2015-09-04 DIAGNOSIS — Z79891 Long term (current) use of opiate analgesic: Secondary | ICD-10-CM

## 2015-09-04 DIAGNOSIS — I2510 Atherosclerotic heart disease of native coronary artery without angina pectoris: Secondary | ICD-10-CM | POA: Diagnosis present

## 2015-09-04 DIAGNOSIS — I10 Essential (primary) hypertension: Secondary | ICD-10-CM | POA: Diagnosis present

## 2015-09-04 DIAGNOSIS — E782 Mixed hyperlipidemia: Secondary | ICD-10-CM | POA: Diagnosis present

## 2015-09-04 DIAGNOSIS — Z882 Allergy status to sulfonamides status: Secondary | ICD-10-CM

## 2015-09-04 DIAGNOSIS — K420 Umbilical hernia with obstruction, without gangrene: Secondary | ICD-10-CM | POA: Diagnosis present

## 2015-09-04 DIAGNOSIS — Z7982 Long term (current) use of aspirin: Secondary | ICD-10-CM

## 2015-09-04 DIAGNOSIS — Z87891 Personal history of nicotine dependence: Secondary | ICD-10-CM

## 2015-09-04 DIAGNOSIS — I429 Cardiomyopathy, unspecified: Secondary | ICD-10-CM | POA: Diagnosis not present

## 2015-09-04 HISTORY — DX: Atherosclerotic heart disease of native coronary artery without angina pectoris: I25.10

## 2015-09-04 HISTORY — DX: Unspecified osteoarthritis, unspecified site: M19.90

## 2015-09-04 HISTORY — DX: Cellulitis of right lower limb: L03.116

## 2015-09-04 HISTORY — DX: Type 2 diabetes mellitus without complications: E11.9

## 2015-09-04 HISTORY — DX: Cellulitis of right lower limb: L03.115

## 2015-09-04 HISTORY — DX: Fibromyalgia: M79.7

## 2015-09-04 LAB — CBC
HCT: 41.1 % (ref 35.0–47.0)
Hemoglobin: 13.5 g/dL (ref 12.0–16.0)
MCH: 26.6 pg (ref 26.0–34.0)
MCHC: 32.9 g/dL (ref 32.0–36.0)
MCV: 81 fL (ref 80.0–100.0)
Platelets: 274 10*3/uL (ref 150–440)
RBC: 5.08 MIL/uL (ref 3.80–5.20)
RDW: 17.5 % — ABNORMAL HIGH (ref 11.5–14.5)
WBC: 11.5 10*3/uL — ABNORMAL HIGH (ref 3.6–11.0)

## 2015-09-04 LAB — COMPREHENSIVE METABOLIC PANEL
ALT: 22 U/L (ref 14–54)
AST: 33 U/L (ref 15–41)
Albumin: 3.7 g/dL (ref 3.5–5.0)
Alkaline Phosphatase: 56 U/L (ref 38–126)
Anion gap: 6 (ref 5–15)
BUN: 11 mg/dL (ref 6–20)
CO2: 29 mmol/L (ref 22–32)
Calcium: 9.7 mg/dL (ref 8.9–10.3)
Chloride: 100 mmol/L — ABNORMAL LOW (ref 101–111)
Creatinine, Ser: 0.66 mg/dL (ref 0.44–1.00)
GFR calc Af Amer: >60 mL/min (ref >60)
GFR calc non Af Amer: >60 mL/min (ref >60)
Glucose, Bld: 180 mg/dL — ABNORMAL HIGH (ref 65–99)
Potassium: 4.4 mmol/L (ref 3.5–5.1)
Sodium: 135 mmol/L (ref 135–145)
Total Bilirubin: 0.9 mg/dL (ref 0.3–1.2)
Total Protein: 7.9 g/dL (ref 6.5–8.1)

## 2015-09-04 LAB — URINALYSIS COMPLETE WITH MICROSCOPIC (ARMC ONLY)
Bilirubin Urine: NEGATIVE
Glucose, UA: NEGATIVE mg/dL
Hgb urine dipstick: NEGATIVE
Leukocytes, UA: NEGATIVE
Nitrite: NEGATIVE
Protein, ur: NEGATIVE mg/dL
Specific Gravity, Urine: 1.016 (ref 1.005–1.030)
pH: 7 (ref 5.0–8.0)

## 2015-09-04 LAB — LIPASE, BLOOD: Lipase: 20 U/L — ABNORMAL LOW (ref 22–51)

## 2015-09-04 LAB — TROPONIN I: Troponin I: 0.16 ng/mL — ABNORMAL HIGH (ref <0.031)

## 2015-09-04 MED ORDER — ONDANSETRON HCL 4 MG/2ML IJ SOLN
4.0000 mg | Freq: Once | INTRAMUSCULAR | Status: AC
  Administered 2015-09-04: 4 mg via INTRAVENOUS
  Filled 2015-09-04: qty 2

## 2015-09-04 MED ORDER — ONDANSETRON HCL 4 MG/2ML IJ SOLN
4.0000 mg | Freq: Once | INTRAMUSCULAR | Status: AC | PRN
  Administered 2015-09-04: 4 mg via INTRAVENOUS
  Filled 2015-09-04: qty 2

## 2015-09-04 MED ORDER — MORPHINE SULFATE (PF) 4 MG/ML IV SOLN
4.0000 mg | Freq: Once | INTRAVENOUS | Status: AC
  Administered 2015-09-04: 4 mg via INTRAVENOUS
  Filled 2015-09-04: qty 1

## 2015-09-04 NOTE — ED Provider Notes (Signed)
[facility] Emergency Department Provider Note  ____________________________________________  Time seen: Approximately 11:23 PM  I have reviewed the triage vital signs and the nursing notes.   HISTORY  Chief Complaint Abdominal Pain and Back Pain    HPI [name] [name] is a 64 y.o. female who comes into the hospital with abdominal and back pain. The patient reports that she is also having some dizziness abdominal bloating and lightheadedness. She reports that the symptoms all started around noon and is gotten worse. The patient reports that she was fine when she woke up this morning. The patient has had some nausea with no vomiting and her pain is a 6 out of 10 in intensity. She reports that the pain is sharp achy and dull the same time. The patient denies any chest pain or shortness of breath denies any vertigo headache or blurred vision. She has some generalized weakness and just feels bad all over. She reports that the pain is in her abdomen and her lower abdomen and in her back. The patient was concerned about the symptoms that she decided to come in for further evaluation. She reports that she also has some tingling all over her body. The patient hasn't taken anything for pain hasn't done anything decided to come in for evaluation.   Past Medical History  Diagnosis Date  . Arthritis   . Fibromyalgia affecting shoulder region   . Diabetes mellitus without complication ([place])   . Cellulitis of both lower extremities     Chronic    Patient Active Problem List   Diagnosis Date Noted  . Cellulitis of both lower extremities   . Adaptation reaction 07/06/2015  . Allergic rhinitis 07/06/2015  . Arthritis 07/06/2015  . Bacterial skin infection of leg 07/06/2015  . Chronic venous insufficiency 07/06/2015  . Atherosclerosis of coronary artery 07/06/2015  . Clinical depression 07/06/2015  . Diabetes mellitus, type 2 ([place]) 07/06/2015  . Accumulation of fluid in  tissues 07/06/2015  . Fibrositis 07/06/2015  . Gastric ulcer 07/06/2015  . Presence of stent in coronary artery 07/06/2015  . Hemorrhoid 07/06/2015  . History of abdominal hernia 07/06/2015  . H/O peptic ulcer 07/06/2015  . Hypercholesteremia 07/06/2015  . BP (high blood pressure) 07/06/2015  . Gonalgia 07/06/2015  . Headache, migraine 07/06/2015  . Extreme obesity ([place]) 07/06/2015  . 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 Breath shortness 07/06/2015  . Spinal stenosis 07/06/2015  . Exomphalos 07/06/2015  . Gait instability 07/06/2015  . Varicose leg ulcer ([place]) 07/06/2015    Past Surgical History  Procedure Laterality Date  . Medial partial knee replacement  01/06/2014  . Vascular stent  11/04/2013  . Anal fissurectomy  01/2004    Current Outpatient Rx  Name  Route  Sig  Dispense  Refill  . ACCU-CHEK AVIVA PLUS test strip      USE 1 STRIP DAILY      3     Dispense as written.   [name] aspirin 81 MG tablet   Oral   Take by mouth.         . B-D INS SYR ULTRAFINE 1CC/30G 30G X 1/2" 1 ML MISC   Subcutaneous   Inject 1 Syringe into the skin See admin instructions.      2     Dispense as written.   . cetirizine (ZYRTEC) 10 MG tablet   Oral   Take 10 mg by mouth daily.      3   . citalopram (  CELEXA) 40 MG tablet   Oral   Take 40 mg by mouth daily.      5   . folic acid (FOLVITE) 1 MG tablet      TAKE 1 TABLET (1 MG TOTAL) BY MOUTH ONCE DAILY.(*NOT COVER*)      11   . glipiZIDE (GLUCOTROL XL) 2.5 MG 24 hr tablet   Oral   Take 2.5 mg by mouth daily.      5   . hydroxychloroquine (PLAQUENIL) 200 MG tablet   Oral   Take 1 tablet by mouth daily.         Javier Docker Oil 1000 MG CAPS   Oral   Take by mouth.         . methotrexate 50 MG/2ML injection   Subcutaneous   Inject 0.6 mLs into the skin every 7 (seven) days.         [name] oxyCODONE-acetaminophen (PERCOCET) 10-325 MG per tablet   Oral   Take 1 tablet by mouth every 4  (four) hours as needed.   120 tablet   0   . penicillin v potassium (VEETID) 250 MG tablet   Oral   Take 1 tablet by mouth 2 (two) times daily.      11   . furosemide (LASIX) 40 MG tablet   Oral   Take by mouth.         . inFLIXimab (REMICADE) 100 MG injection   Intravenous   Inject into the vein.         . potassium chloride SA (K-DUR,KLOR-CON) 20 MEQ tablet   Oral   Take by mouth.         . solifenacin (VESICARE) 10 MG tablet   Oral   Take by mouth.           Allergies Latex; Doxycycline; and Sulfa antibiotics  Family History  Problem Relation Age of Onset  . Hypertension Mother   . Cataracts Mother   . Thyroid disease Mother   . COPD Father   . Dementia Father   . Cataracts Father   . Aneurysm Father     Abdominal & Brain  . Breast cancer Sister   . Fibromyalgia Sister   . Migraines Sister   . Hypertension Sister   . Breast cancer Sister   . COPD Sister   . Uterine cancer Sister   . Migraines Brother   . Heart attack Brother     Social History Social History  Substance Use Topics  . Smoking status: Former Smoker    Quit date: 12/03/1996  . Smokeless tobacco: Never Used  . Alcohol Use: No    Review of Systems Constitutional: No fever/chills Eyes: No visual changes. ENT: No sore throat. Cardiovascular: Denies chest pain. Respiratory: Denies shortness of breath. Gastrointestinal: Abdominal pain and nausea Genitourinary: Negative for dysuria. Musculoskeletal:  back pain. Skin: Negative for rash. Neurological: Lightheadedness and dizziness, tingling  10-point ROS otherwise negative.  ____________________________________________   PHYSICAL EXAM:  VITAL SIGNS: ED Triage Vitals  Enc Vitals Group     BP 09/04/15 2130 139/104 mmHg     Pulse Rate 09/04/15 2130 77     Resp 09/04/15 2130 22     Temp 09/04/15 2130 98.5 F (36.9 C)     Temp Source 09/04/15 2130 Oral     SpO2 09/04/15 2130 96 %     Weight 09/04/15 2130 329 lb  (149.233 kg)     Height 09/04/15 2130 5\' 8"  (1.727 m)  Head Cir --      Peak Flow --      Pain Score 09/04/15 2131 6     Pain Loc --      Pain Edu? --      Excl. in [place]? --     Constitutional: Alert and oriented. Ill-appearing in moderate distress. Eyes: Conjunctivae are normal. PERRL. EOMI. Head: Atraumatic. Nose: No congestion/rhinnorhea. Mouth/Throat: Mucous membranes are moist.  Oropharynx non-erythematous. Cardiovascular: Normal rate, regular rhythm. Grossly normal heart sounds.  Good peripheral circulation. Respiratory: Normal respiratory effort.  No retractions. Lungs CTAB. Gastrointestinal: Obese abdomen with diffuse abdominal tenderness worse in the upper abdomen. No distention. Positive bowel sounds Musculoskeletal: No lower extremity tenderness nor edema.  Mild tenderness palpation of left lower extremity Neurologic:  Normal speech and language. No gross focal neurologic deficits are appreciated.  Skin:  Skin is warm, dry and intact.  Psychiatric: Mood and affect are normal.   ____________________________________________   LABS (all labs ordered are listed, but only abnormal results are displayed)  Labs Reviewed  LIPASE, BLOOD - Abnormal; Notable for the following:    Lipase 20 (*)    All other components within normal limits  COMPREHENSIVE METABOLIC PANEL - Abnormal; Notable for the following:    Chloride 100 (*)    Glucose, Bld 180 (*)    All other components within normal limits  CBC - Abnormal; Notable for the following:    WBC 11.5 (*)    RDW 17.5 (*)    All other components within normal limits  URINALYSIS COMPLETEWITH MICROSCOPIC ([facility] ONLY) - Abnormal; Notable for the following:    Color, Urine YELLOW (*)    APPearance TURBID (*)    Ketones, ur 1+ (*)    Bacteria, UA RARE (*)    Squamous Epithelial / LPF 0-5 (*)    All other components within normal limits  TROPONIN I - Abnormal; Notable for the following:    Troponin I 0.16 (*)    All other  components within normal limits  LACTIC ACID, PLASMA  TROPONIN I   ____________________________________________  EKG  ED ECG REPORT I, Loney Hering, the attending physician, personally viewed and interpreted this ECG.   Date: 09/04/2015  EKG Time: 2147  Rate: 79  Rhythm: normal sinus rhythm  Axis: normal  Intervals:none  ST&T Change: diffuse t wave flattening  ____________________________________________  RADIOLOGY  Chest x-ray: No acute cardiopulmonary process seen  CT abdomen: No evidence of aortic dissection, no evidence of aneurysmal rotation, the superior and inferior mesenteric arteries remained patent, soft tissue inflammation and trace fluid tracking about small bowel loops at the lower abdomen, posterior to a moderate umbilical hernia that contained a short segment of ileum, no definite evidence of obstruction as there are no dilated small bowel loops, this may reflect some degree of dysmotility and mesenteric venous congestion due to incarceration, small amount of fluid noted within the hernia. Nonobstructing bilateral renal stones noted ____________________________________________   PROCEDURES  Procedure(s) performed: None  Critical Care performed: No  ____________________________________________   INITIAL IMPRESSION / ASSESSMENT AND PLAN / ED COURSE  Pertinent labs & imaging results that were available during my care of the patient were reviewed by me and considered in my medical decision making (see chart for details).  This is a 64 year old female who comes in today with back pain and abdominal pain that started at noon. The patient reports she is very uncomfortable and the pain is getting worse. The patient does have an  elevated troponin of 0.16 but given her back and abdominal pain I will do a CT Angio looking for possible AAA versus dissection. The patient has never had problems with her blood pressure but her blood pressure is elevated currently. I  will give the patient dose of morphine as well as a dose of Zofran to help with her pain and reassess the patient once I received the results of her CT scan.  The patient appears to have an incarcerated umbilical hernia. I contacted the surgeon Dr. Marina Gravel who came to see the patient and will admit her for surgery. I gave the patient second dose of morphine as well as some normal saline while she was in the emergency department. Her lactic acid is 1.4. ____________________________________________   FINAL CLINICAL IMPRESSION(S) / ED DIAGNOSES  Final diagnoses:  Incarcerated hernia      Loney Hering, MD 09/05/15 [phone]

## 2015-09-04 NOTE — ED Notes (Signed)
EMS pt from home; to stat registration via wheelchair; pt c/o sudden onset abd pain around noon today; worsening; 2 BM's today normal; abd soft, tender; no distention; BP 170/100; HR 90; FSBS 171; 96% on room air

## 2015-09-04 NOTE — ED Notes (Signed)
Pt c/o pelvic and lower abdominal pain radiating to low back. Pt denies dysuria at this time. Pt denies diarrhea, c/o nausea. Pt denies vomiting at this time. Pt has hx chronic pain, fibromyalgia that she states is in shoulders and arms.

## 2015-09-05 ENCOUNTER — Inpatient Hospital Stay
Admit: 2015-09-05 | Discharge: 2015-09-05 | Disposition: A | Discharge status: Home / Self Care | Payer: Medicaid Other | Attending: Internal Medicine | Admitting: Internal Medicine

## 2015-09-05 ENCOUNTER — Emergency Department: Payer: Medicaid Other | Admitting: Anesthesiology

## 2015-09-05 ENCOUNTER — Emergency Department: Payer: Medicaid Other

## 2015-09-05 ENCOUNTER — Encounter: Payer: Self-pay | Admitting: Radiology

## 2015-09-05 ENCOUNTER — Encounter
Admission: EM | Disposition: A | Discharge status: Home / Self Care | Payer: Self-pay | Source: Home / Self Care | Attending: Surgery

## 2015-09-05 DIAGNOSIS — I10 Essential (primary) hypertension: Secondary | ICD-10-CM | POA: Diagnosis present

## 2015-09-05 DIAGNOSIS — Z882 Allergy status to sulfonamides status: Secondary | ICD-10-CM | POA: Diagnosis not present

## 2015-09-05 DIAGNOSIS — Z7982 Long term (current) use of aspirin: Secondary | ICD-10-CM | POA: Diagnosis not present

## 2015-09-05 DIAGNOSIS — K460 Unspecified abdominal hernia with obstruction, without gangrene: Secondary | ICD-10-CM

## 2015-09-05 DIAGNOSIS — E1151 Type 2 diabetes mellitus with diabetic peripheral angiopathy without gangrene: Secondary | ICD-10-CM | POA: Diagnosis present

## 2015-09-05 DIAGNOSIS — E782 Mixed hyperlipidemia: Secondary | ICD-10-CM | POA: Diagnosis present

## 2015-09-05 DIAGNOSIS — Z79899 Other long term (current) drug therapy: Secondary | ICD-10-CM | POA: Diagnosis not present

## 2015-09-05 DIAGNOSIS — I248 Other forms of acute ischemic heart disease: Secondary | ICD-10-CM | POA: Diagnosis not present

## 2015-09-05 DIAGNOSIS — Z87891 Personal history of nicotine dependence: Secondary | ICD-10-CM | POA: Diagnosis not present

## 2015-09-05 DIAGNOSIS — M797 Fibromyalgia: Secondary | ICD-10-CM | POA: Diagnosis present

## 2015-09-05 DIAGNOSIS — K420 Umbilical hernia with obstruction, without gangrene: Principal | ICD-10-CM

## 2015-09-05 DIAGNOSIS — Z79891 Long term (current) use of opiate analgesic: Secondary | ICD-10-CM | POA: Diagnosis not present

## 2015-09-05 DIAGNOSIS — F418 Other specified anxiety disorders: Secondary | ICD-10-CM | POA: Diagnosis present

## 2015-09-05 DIAGNOSIS — Z91040 Latex allergy status: Secondary | ICD-10-CM | POA: Diagnosis not present

## 2015-09-05 DIAGNOSIS — L03116 Cellulitis of left lower limb: Secondary | ICD-10-CM | POA: Diagnosis present

## 2015-09-05 DIAGNOSIS — Z792 Long term (current) use of antibiotics: Secondary | ICD-10-CM | POA: Diagnosis not present

## 2015-09-05 DIAGNOSIS — Z6841 Body Mass Index (BMI) 40.0 and over, adult: Secondary | ICD-10-CM | POA: Diagnosis not present

## 2015-09-05 DIAGNOSIS — E7800 Pure hypercholesterolemia, unspecified: Secondary | ICD-10-CM | POA: Diagnosis present

## 2015-09-05 DIAGNOSIS — M069 Rheumatoid arthritis, unspecified: Secondary | ICD-10-CM | POA: Diagnosis present

## 2015-09-05 DIAGNOSIS — Z881 Allergy status to other antibiotic agents status: Secondary | ICD-10-CM | POA: Diagnosis not present

## 2015-09-05 DIAGNOSIS — L03115 Cellulitis of right lower limb: Secondary | ICD-10-CM | POA: Diagnosis present

## 2015-09-05 DIAGNOSIS — I429 Cardiomyopathy, unspecified: Secondary | ICD-10-CM | POA: Diagnosis not present

## 2015-09-05 DIAGNOSIS — I2510 Atherosclerotic heart disease of native coronary artery without angina pectoris: Secondary | ICD-10-CM | POA: Diagnosis present

## 2015-09-05 DIAGNOSIS — Z01818 Encounter for other preprocedural examination: Secondary | ICD-10-CM

## 2015-09-05 HISTORY — PX: UMBILICAL HERNIA REPAIR: SHX196

## 2015-09-05 LAB — GLUCOSE, CAPILLARY
Glucose-Capillary: 190 mg/dL — ABNORMAL HIGH (ref 65–99)
Glucose-Capillary: 219 mg/dL — ABNORMAL HIGH (ref 65–99)
Glucose-Capillary: 128 mg/dL — ABNORMAL HIGH (ref 65–99)

## 2015-09-05 LAB — LACTIC ACID, PLASMA: Lactic Acid, Venous: 1.4 mmol/L (ref 0.5–2.0)

## 2015-09-05 LAB — TROPONIN I
Troponin I: 0.6 ng/mL — ABNORMAL HIGH (ref <0.031)
Troponin I: 1 ng/mL — ABNORMAL HIGH (ref <0.031)

## 2015-09-05 SURGERY — REPAIR, HERNIA, UMBILICAL, ADULT
Anesthesia: General | Site: Abdomen | Wound class: Clean

## 2015-09-05 MED ORDER — ASPIRIN EC 81 MG PO TBEC
81.0000 mg | DELAYED_RELEASE_TABLET | Freq: Every day | ORAL | Status: DC
  Administered 2015-09-05 – 2015-09-06 (×2): 81 mg via ORAL
  Filled 2015-09-05 (×2): qty 1

## 2015-09-05 MED ORDER — ONDANSETRON HCL 4 MG PO TABS: 4.0000 mg | ORAL_TABLET | Freq: Four times a day (QID) | ORAL | Status: DC | PRN

## 2015-09-05 MED ORDER — GLYCOPYRROLATE 0.2 MG/ML IJ SOLN
INTRAMUSCULAR | Status: DC | PRN
  Administered 2015-09-05: .8 mg via INTRAVENOUS

## 2015-09-05 MED ORDER — IPRATROPIUM-ALBUTEROL 0.5-2.5 (3) MG/3ML IN SOLN
RESPIRATORY_TRACT | Status: AC
  Filled 2015-09-05: qty 3

## 2015-09-05 MED ORDER — PIPERACILLIN-TAZOBACTAM 3.375 G IVPB
3.3750 g | Freq: Four times a day (QID) | INTRAVENOUS | Status: DC
  Administered 2015-09-05: 3.375 g via INTRAVENOUS

## 2015-09-05 MED ORDER — OXYCODONE-ACETAMINOPHEN 10-325 MG PO TABS: 1.0000 | ORAL_TABLET | Freq: Four times a day (QID) | ORAL | Status: DC | PRN

## 2015-09-05 MED ORDER — FENTANYL CITRATE (PF) 100 MCG/2ML IJ SOLN
25.0000 ug | INTRAMUSCULAR | Status: DC | PRN
  Administered 2015-09-05 (×2): 25 ug via INTRAVENOUS

## 2015-09-05 MED ORDER — ENOXAPARIN SODIUM 40 MG/0.4ML ~~LOC~~ SOLN
40.0000 mg | Freq: Two times a day (BID) | SUBCUTANEOUS | Status: DC
  Administered 2015-09-05 – 2015-09-06 (×2): 40 mg via SUBCUTANEOUS
  Filled 2015-09-05 (×2): qty 0.4

## 2015-09-05 MED ORDER — OXYCODONE HCL 5 MG PO TABS
10.0000 mg | ORAL_TABLET | Freq: Four times a day (QID) | ORAL | Status: DC | PRN
  Administered 2015-09-05 – 2015-09-06 (×3): 10 mg via ORAL
  Filled 2015-09-05 (×3): qty 2

## 2015-09-05 MED ORDER — OXYCODONE HCL 5 MG PO TABS: 5.0000 mg | ORAL_TABLET | Freq: Once | ORAL | Status: DC | PRN

## 2015-09-05 MED ORDER — ACETAMINOPHEN 325 MG PO TABS
650.0000 mg | ORAL_TABLET | Freq: Four times a day (QID) | ORAL | Status: DC | PRN
Start: 2015-09-05 — End: 2015-09-06

## 2015-09-05 MED ORDER — SUCCINYLCHOLINE CHLORIDE 20 MG/ML IJ SOLN
INTRAMUSCULAR | Status: DC | PRN
  Administered 2015-09-05: 100 mg via INTRAVENOUS

## 2015-09-05 MED ORDER — NEOSTIGMINE METHYLSULFATE 10 MG/10ML IV SOLN
INTRAVENOUS | Status: DC | PRN
  Administered 2015-09-05: 4 mg via INTRAVENOUS

## 2015-09-05 MED ORDER — LIDOCAINE HCL (CARDIAC) 20 MG/ML IV SOLN
INTRAVENOUS | Status: DC | PRN
  Administered 2015-09-05: 50 mg via INTRAVENOUS

## 2015-09-05 MED ORDER — PIPERACILLIN-TAZOBACTAM 3.375 G IVPB
INTRAVENOUS | Status: AC
  Filled 2015-09-05: qty 50

## 2015-09-05 MED ORDER — INSULIN ASPART 100 UNIT/ML ~~LOC~~ SOLN: 0.0000 [IU] | Freq: Every day | SUBCUTANEOUS | Status: DC

## 2015-09-05 MED ORDER — MIDAZOLAM HCL 2 MG/2ML IJ SOLN
INTRAMUSCULAR | Status: DC | PRN
  Administered 2015-09-05: 2 mg via INTRAVENOUS

## 2015-09-05 MED ORDER — ROCURONIUM BROMIDE 100 MG/10ML IV SOLN
INTRAVENOUS | Status: DC | PRN
  Administered 2015-09-05: 50 mg via INTRAVENOUS

## 2015-09-05 MED ORDER — HYDROXYCHLOROQUINE SULFATE 200 MG PO TABS
200.0000 mg | ORAL_TABLET | Freq: Every day | ORAL | Status: DC
  Administered 2015-09-05 – 2015-09-06 (×2): 200 mg via ORAL
  Filled 2015-09-05 (×2): qty 1

## 2015-09-05 MED ORDER — PROPOFOL 10 MG/ML IV BOLUS
INTRAVENOUS | Status: DC | PRN
  Administered 2015-09-05: 160 mg via INTRAVENOUS

## 2015-09-05 MED ORDER — ESMOLOL HCL 10 MG/ML IV SOLN
INTRAVENOUS | Status: DC | PRN
  Administered 2015-09-05: 40 mg via INTRAVENOUS

## 2015-09-05 MED ORDER — ONDANSETRON HCL 4 MG/2ML IJ SOLN
INTRAMUSCULAR | Status: DC | PRN
  Administered 2015-09-05: 4 mg via INTRAVENOUS

## 2015-09-05 MED ORDER — FUROSEMIDE 20 MG PO TABS
20.0000 mg | ORAL_TABLET | Freq: Every day | ORAL | Status: DC
Start: 2015-09-05 — End: 2015-09-06
  Administered 2015-09-05 – 2015-09-06 (×2): 20 mg via ORAL
  Filled 2015-09-05 (×2): qty 1

## 2015-09-05 MED ORDER — IOHEXOL 350 MG/ML SOLN
100.0000 mL | Freq: Once | INTRAVENOUS | Status: AC | PRN
  Administered 2015-09-05: 100 mL via INTRAVENOUS

## 2015-09-05 MED ORDER — FENTANYL CITRATE (PF) 100 MCG/2ML IJ SOLN
INTRAMUSCULAR | Status: AC
  Filled 2015-09-05: qty 2

## 2015-09-05 MED ORDER — EPHEDRINE SULFATE 50 MG/ML IJ SOLN
INTRAMUSCULAR | Status: DC | PRN
  Administered 2015-09-05: 10 mg via INTRAVENOUS

## 2015-09-05 MED ORDER — SODIUM CHLORIDE 0.9 % IV BOLUS (SEPSIS)
1000.0000 mL | Freq: Once | INTRAVENOUS | Status: AC
  Administered 2015-09-05: 1000 mL via INTRAVENOUS

## 2015-09-05 MED ORDER — MORPHINE SULFATE (PF) 4 MG/ML IV SOLN
4.0000 mg | Freq: Once | INTRAVENOUS | Status: AC
  Administered 2015-09-05: 4 mg via INTRAVENOUS

## 2015-09-05 MED ORDER — PHENYLEPHRINE HCL 10 MG/ML IJ SOLN
INTRAMUSCULAR | Status: DC | PRN
  Administered 2015-09-05 (×2): 100 ug via INTRAVENOUS

## 2015-09-05 MED ORDER — IPRATROPIUM-ALBUTEROL 0.5-2.5 (3) MG/3ML IN SOLN
3.0000 mL | Freq: Once | RESPIRATORY_TRACT | Status: AC
  Administered 2015-09-05: 3 mL via RESPIRATORY_TRACT

## 2015-09-05 MED ORDER — ACETAMINOPHEN 650 MG RE SUPP: 650.0000 mg | Freq: Four times a day (QID) | RECTAL | Status: DC | PRN

## 2015-09-05 MED ORDER — PIPERACILLIN-TAZOBACTAM 3.375 G IVPB
3.3750 g | Freq: Three times a day (TID) | INTRAVENOUS | Status: DC
  Administered 2015-09-05 – 2015-09-06 (×4): 3.375 g via INTRAVENOUS
  Filled 2015-09-05 (×5): qty 50

## 2015-09-05 MED ORDER — ONDANSETRON HCL 4 MG/2ML IJ SOLN: 4.0000 mg | Freq: Four times a day (QID) | INTRAMUSCULAR | Status: DC | PRN

## 2015-09-05 MED ORDER — DEXAMETHASONE SODIUM PHOSPHATE 4 MG/ML IJ SOLN
INTRAMUSCULAR | Status: DC | PRN
  Administered 2015-09-05: 10 mg via INTRAVENOUS

## 2015-09-05 MED ORDER — ENOXAPARIN SODIUM 40 MG/0.4ML ~~LOC~~ SOLN
40.0000 mg | Freq: Every day | SUBCUTANEOUS | Status: DC
Start: 2015-09-05 — End: 2015-09-05
  Administered 2015-09-05: 40 mg via SUBCUTANEOUS
  Filled 2015-09-05: qty 0.4

## 2015-09-05 MED ORDER — DARIFENACIN HYDROBROMIDE ER 7.5 MG PO TB24
7.5000 mg | ORAL_TABLET | Freq: Every day | ORAL | Status: DC
  Administered 2015-09-05 – 2015-09-06 (×2): 7.5 mg via ORAL
  Filled 2015-09-05 (×2): qty 1

## 2015-09-05 MED ORDER — LACTATED RINGERS IV SOLN
INTRAVENOUS | Status: DC | PRN
  Administered 2015-09-05: 04:00:00 via INTRAVENOUS

## 2015-09-05 MED ORDER — ACETAMINOPHEN 325 MG PO TABS: 325.0000 mg | ORAL_TABLET | Freq: Four times a day (QID) | ORAL | Status: DC | PRN

## 2015-09-05 MED ORDER — FENTANYL CITRATE (PF) 100 MCG/2ML IJ SOLN
INTRAMUSCULAR | Status: DC | PRN
  Administered 2015-09-05: 100 ug via INTRAVENOUS
  Administered 2015-09-05: 150 ug via INTRAVENOUS

## 2015-09-05 MED ORDER — MORPHINE SULFATE (PF) 4 MG/ML IV SOLN
INTRAVENOUS | Status: AC
  Administered 2015-09-05: 4 mg via INTRAVENOUS
  Filled 2015-09-05: qty 1

## 2015-09-05 MED ORDER — MORPHINE SULFATE (PF) 2 MG/ML IV SOLN
2.0000 mg | INTRAVENOUS | Status: DC | PRN
Start: 2015-09-05 — End: 2015-09-06
  Administered 2015-09-05 (×2): 2 mg via INTRAVENOUS
  Filled 2015-09-05 (×2): qty 1

## 2015-09-05 MED ORDER — OXYCODONE HCL 5 MG/5ML PO SOLN: 5.0000 mg | Freq: Once | ORAL | Status: DC | PRN

## 2015-09-05 MED ORDER — KCL IN DEXTROSE-NACL 20-5-0.45 MEQ/L-%-% IV SOLN
INTRAVENOUS | Status: DC
  Administered 2015-09-05: 11:00:00 via INTRAVENOUS
  Filled 2015-09-05 (×4): qty 1000

## 2015-09-05 MED ORDER — CITALOPRAM HYDROBROMIDE 20 MG PO TABS
40.0000 mg | ORAL_TABLET | Freq: Every day | ORAL | Status: DC
  Administered 2015-09-06: 40 mg via ORAL
  Filled 2015-09-05: qty 2

## 2015-09-05 MED ORDER — INSULIN ASPART 100 UNIT/ML ~~LOC~~ SOLN
0.0000 [IU] | Freq: Three times a day (TID) | SUBCUTANEOUS | Status: DC
Start: 2015-09-05 — End: 2015-09-06
  Administered 2015-09-05: 4 [IU] via SUBCUTANEOUS
  Administered 2015-09-05: 3 [IU] via SUBCUTANEOUS
  Administered 2015-09-05: 7 [IU] via SUBCUTANEOUS
  Administered 2015-09-06 (×2): 3 [IU] via SUBCUTANEOUS
  Filled 2015-09-05: qty 7
  Filled 2015-09-05 (×2): qty 3
  Filled 2015-09-05: qty 4
  Filled 2015-09-05: qty 3
  Filled 2015-09-05: qty 7

## 2015-09-05 MED ORDER — POTASSIUM CHLORIDE IN NACL 20-0.9 MEQ/L-% IV SOLN
INTRAVENOUS | Status: DC
  Administered 2015-09-05: 14:00:00 via INTRAVENOUS
  Filled 2015-09-05 (×3): qty 1000

## 2015-09-05 MED ORDER — IPRATROPIUM-ALBUTEROL 0.5-2.5 (3) MG/3ML IN SOLN
3.0000 mL | Freq: Once | RESPIRATORY_TRACT | Status: DC | PRN
  Administered 2015-09-05: 3 mL via RESPIRATORY_TRACT

## 2015-09-05 SURGICAL SUPPLY — 32 items
CANISTER SUCT 1200ML W/VALVE (MISCELLANEOUS) ×2 IMPLANT
CHLORAPREP W/TINT 26ML (MISCELLANEOUS) ×2 IMPLANT
DRAPE INCISE IOBAN 66X45 STRL (DRAPES) IMPLANT
DRAPE LAPAROTOMY 100X77 ABD (DRAPES) ×2 IMPLANT
DRAPE SHEET LG 3/4 BI-LAMINATE (DRAPES) ×2 IMPLANT
DRAPE UTILITY 15X26 TOWEL STRL (DRAPES) ×4 IMPLANT
DRSG OPSITE POSTOP 4X6 (GAUZE/BANDAGES/DRESSINGS) ×2 IMPLANT
ELECT CAUTERY BLADE 6.4 (BLADE) ×2 IMPLANT
GAUZE SPONGE 4X4 12PLY STRL (GAUZE/BANDAGES/DRESSINGS) IMPLANT
GLOVE BIO SURGEON STRL SZ7.5 (GLOVE) ×6 IMPLANT
GOWN STRL REUS W/ TWL LRG LVL3 (GOWN DISPOSABLE) ×2 IMPLANT
GOWN STRL REUS W/TWL LRG LVL3 (GOWN DISPOSABLE) ×2
KIT RM TURNOVER STRD PROC AR (KITS) ×2 IMPLANT
LABEL OR SOLS (LABEL) IMPLANT
NEEDLE FILTER BLUNT 18X 1/2SAF (NEEDLE)
NEEDLE FILTER BLUNT 18X1 1/2 (NEEDLE) IMPLANT
NEEDLE HYPO 25X1 1.5 SAFETY (NEEDLE) IMPLANT
NS IRRIG 500ML POUR BTL (IV SOLUTION) ×2 IMPLANT
PACK BASIN MAJOR ARMC (MISCELLANEOUS) ×2 IMPLANT
PACK BASIN MINOR ARMC (MISCELLANEOUS) IMPLANT
PAD GROUND ADULT SPLIT (MISCELLANEOUS) ×2 IMPLANT
SPONGE LAP 18X18 5 PK (GAUZE/BANDAGES/DRESSINGS) IMPLANT
STAPLER SKIN PROX 35W (STAPLE) ×2 IMPLANT
STRIP CLOSURE SKIN 1/2X4 (GAUZE/BANDAGES/DRESSINGS) IMPLANT
SUT ETHIBOND 0 (SUTURE) ×6 IMPLANT
SUT VIC AB 2-0 CT1 (SUTURE) ×2 IMPLANT
SUT VIC AB 3-0 SH 27 (SUTURE)
SUT VIC AB 3-0 SH 27X BRD (SUTURE) IMPLANT
SUT VIC AB 4-0 FS2 27 (SUTURE) IMPLANT
SWABSTK COMLB BENZOIN TINCTURE (MISCELLANEOUS) IMPLANT
SYR 3ML LL SCALE MARK (SYRINGE) IMPLANT
SYRINGE 10CC LL (SYRINGE) IMPLANT

## 2015-09-05 NOTE — ED Notes (Signed)
Don Perking RN [facility] # [phone].

## 2015-09-05 NOTE — Progress Notes (Signed)
.  Due to patients BMI>40, per pharmacy policy, patient will be transitioned to lovenox 40mg  BID. Ramond Dial, Pharm.D Clinical Pharmacist 09/05/2015

## 2015-09-05 NOTE — ED Notes (Signed)
Updated pt on current plan of care and awaiting surgeon consult.

## 2015-09-05 NOTE — Consult Note (Signed)
[facility] Cardiology Consultation Note  Patient ID: ANNALEI FRIESZ, MRN: [id number], DOB/AGE: [birth year] 64 y.o. Admit date: 09/04/2015   Date of Consult: 09/05/2015 Primary Physician: Margarita Rana, MD Primary Cardiologist: None  Chief Complaint:  Chief Complaint  Patient presents with  . Abdominal Pain  . Back Pain   Reason for Consult: elevated troponin  HPI: 64 y.o. female with known diabetes and peripheral vascular disease with essential hypertension who had an umbilical hernia which was incarcerated and needed surgical intervention. The patient had no evidence of chest pain shortness of breath cardiovascular symptoms and/or heart failure prior to surgery. After surgery the patient has done fairly well with no evidence of cardiac symptoms. She did have some issues with abdominal pain and has had an elevated troponin increasing to 1.0. There has been no other rhythm disturbances at this time. There is been no evidence of EKG changes consistent with acute myocardial infarction despite history of possible coronary artery disease in the past and peripheral vascular disease. The patient currently is stable  Past Medical History  Diagnosis Date  . Arthritis   . Fibromyalgia affecting shoulder region   . Diabetes mellitus without complication ([place])   . Cellulitis of both lower extremities     Chronic  . Coronary artery disease   . Fibromyalgia       Surgical History:  Past Surgical History  Procedure Laterality Date  . Medial partial knee replacement  01/06/2014  . Vascular stent  11/04/2013  . Anal fissurectomy  01/2004  . Hernia repair       Home Meds: Prior to Admission medications   Medication Sig Start Date End Date Taking? Authorizing Provider  ACCU-CHEK AVIVA PLUS test strip USE 1 STRIP DAILY 03/31/15  Yes Historical Provider, MD  aspirin 81 MG tablet Take by mouth.   Yes Historical Provider, MD  B-D INS SYR ULTRAFINE 1CC/30G 30G X 1/2" 1 ML MISC Inject 1 Syringe  into the skin See admin instructions. 07/28/15  Yes Historical Provider, MD  cetirizine (ZYRTEC) 10 MG tablet Take 10 mg by mouth daily. 02/16/15  Yes Historical Provider, MD  citalopram (CELEXA) 40 MG tablet Take 40 mg by mouth daily. 03/31/15  Yes Historical Provider, MD  folic acid (FOLVITE) 1 MG tablet TAKE 1 TABLET (1 MG TOTAL) BY MOUTH ONCE DAILY.(*NOT COVER*) 05/11/15  Yes Historical Provider, MD  glipiZIDE (GLUCOTROL XL) 2.5 MG 24 hr tablet Take 2.5 mg by mouth daily. 03/31/15  Yes Historical Provider, MD  hydroxychloroquine (PLAQUENIL) 200 MG tablet Take 1 tablet by mouth daily. 06/15/15  Yes Historical Provider, MD  Javier Docker Oil 1000 MG CAPS Take by mouth.   Yes Historical Provider, MD  methotrexate 50 MG/2ML injection Inject 0.6 mLs into the skin every 7 (seven) days. 06/15/15  Yes Historical Provider, MD  oxyCODONE-acetaminophen (PERCOCET) 10-325 MG per tablet Take 1 tablet by mouth every 4 (four) hours as needed. 08/09/15  Yes Margarita Rana, MD  penicillin v potassium (VEETID) 250 MG tablet Take 1 tablet by mouth 2 (two) times daily. 07/27/15  Yes Historical Provider, MD  furosemide (LASIX) 40 MG tablet Take by mouth. 11/05/14   Historical Provider, MD  inFLIXimab (REMICADE) 100 MG injection Inject into the vein.    Historical Provider, MD  potassium chloride SA (K-DUR,KLOR-CON) 20 MEQ tablet Take by mouth. 11/05/14   Historical Provider, MD  solifenacin (VESICARE) 10 MG tablet Take by mouth. 08/16/14   Historical Provider, MD    Inpatient Medications:  . aspirin  EC  81 mg Oral Daily  . [START ON 09/06/2015] citalopram  40 mg Oral Daily  . darifenacin  7.5 mg Oral Daily  . enoxaparin (LOVENOX) injection  40 mg Subcutaneous Daily  . fentaNYL      . furosemide  20 mg Oral Daily  . hydroxychloroquine  200 mg Oral Daily  . insulin aspart  0-20 Units Subcutaneous TID WC  . insulin aspart  0-5 Units Subcutaneous QHS  . ipratropium-albuterol      . ipratropium-albuterol      . piperacillin-tazobactam  (ZOSYN)  IV  3.375 g Intravenous 3 times per day   . 0.9 % NaCl with KCl 20 mEq / L      Allergies:  Allergies  Allergen Reactions  . Latex     itching  . Doxycycline Rash  . Sulfa Antibiotics Itching, Rash and Swelling    Social History   Social History  . Marital Status: Married    Spouse Name: N/A  . Number of Children: 2  . Years of Education: 9th Grade   Occupational History  . Disabled    Social History Main Topics  . Smoking status: Former Smoker    Quit date: 12/03/1996  . Smokeless tobacco: Never Used  . Alcohol Use: No  . Drug Use: No  . Sexual Activity: Yes    Birth Control/ Protection: None   Other Topics Concern  . Not on file   Social History Narrative     Family History  Problem Relation Age of Onset  . Hypertension Mother   . Cataracts Mother   . Thyroid disease Mother   . COPD Father   . Dementia Father   . Cataracts Father   . Aneurysm Father     Abdominal & Brain  . Breast cancer Sister   . Fibromyalgia Sister   . Migraines Sister   . Hypertension Sister   . Breast cancer Sister   . COPD Sister   . Uterine cancer Sister   . Migraines Brother   . Heart attack Brother      Review of Systems Positive for abdominal discomfort and shortness of breath Negative for: General:  chills, fever, night sweats or weight changes.  Cardiovascular: PND orthopnea syncope dizziness  Dermatological skin lesions rashes Respiratory: Cough congestion Urologic: Frequent urination urination at night and hematuria Abdominal: negative for   bright red blood per rectum, melena, or hematemesis Neurologic: negative for visual changes, and/or hearing changes  All other systems reviewed and are otherwise negative except as noted above.  Labs:  Recent Labs  09/04/15 2209 09/05/15 0136 09/05/15 1150  TROPONINI 0.16* 0.60* 1.00*   Lab Results  Component Value Date   WBC 11.5* 09/04/2015   HGB 13.5 09/04/2015   HCT 41.1 09/04/2015   MCV 81.0  09/04/2015   PLT 274 09/04/2015    Recent Labs Lab 09/04/15 2209  NA 135  K 4.4  CL 100*  CO2 29  BUN 11  CREATININE 0.66  CALCIUM 9.7  PROT 7.9  BILITOT 0.9  ALKPHOS 56  ALT 22  AST 33  GLUCOSE 180*   No results found for: CHOL, HDL, LDLCALC, TRIG No results found for: DDIMER  Radiology/Studies:  Dg Chest 2 View  09/04/2015   CLINICAL DATA:  Acute onset of generalized abdominal pain. Initial encounter.  EXAM: CHEST  2 VIEW  COMPARISON:  Chest radiograph performed 10/20/2014  FINDINGS: The lungs are well-aerated and clear. There is no evidence of focal opacification, pleural  effusion or pneumothorax.  The heart is normal in size; the mediastinal contour is within normal limits. No acute osseous abnormalities are seen.  IMPRESSION: No acute cardiopulmonary process seen.   Electronically Signed   By: Garald Balding M.D.   On: 09/04/2015 23:40   Ct Cta Abd/pel W/cm &/or W/o Cm  09/05/2015   CLINICAL DATA:  Acute onset of lower abdominal pain and pelvic pain, radiating to the lower back. Initial encounter.  EXAM: CTA ABDOMEN AND PELVIS wITHOUT AND WITH CONTRAST  TECHNIQUE: Multidetector CT imaging of the abdomen and pelvis was performed using the standard protocol during bolus administration of intravenous contrast. Multiplanar reconstructed images and MIPs were obtained and reviewed to evaluate the vascular anatomy.  CONTRAST:  169mL OMNIPAQUE IOHEXOL 350 MG/ML SOLN  COMPARISON:  CT of the abdomen and pelvis performed 07/23/2011  FINDINGS: The visualized lung bases are clear.  There is no evidence of aortic dissection. There is no evidence of aneurysmal dilatation. Minimal calcification is noted along the common iliac arteries bilaterally. No luminal narrowing is seen. The superior mesenteric artery and inferior mesenteric artery remain fully patent, without evidence for ischemia.  The celiac trunk and bilateral renal arteries are also patent. The inferior vena cava is unremarkable in  appearance.  Soft tissue inflammation and trace fluid are seen tracking about small bowel loops at the lower abdomen, posterior to a moderate umbilical hernia that contains a short segment of ileum. There is no definite evidence of obstruction, as no dilated small bowel loops are seen. This may simply reflect some degree of dysmotility and mesenteric congestion due to incarceration. A small amount of fluid is noted within the umbilical hernia.  The liver and spleen are unremarkable in appearance. A stone is noted dependently within the gallbladder. The gallbladder is otherwise unremarkable. The pancreas and adrenal glands are unremarkable.  A 7 mm nonobstructing stone is noted at the posterior aspect of the left kidney. A few nonobstructing right renal stones are also suggested. There is no evidence hydronephrosis. No obstructing ureteral stones are identified. No significant stranding.  No free fluid is identified. The small bowel is unremarkable in appearance. The stomach is within normal limits. No acute vascular abnormalities are seen.  The appendix is not definitely characterized; there is no evidence for appendicitis. The colon is unremarkable in appearance.  The bladder is mildly distended and grossly unremarkable. The uterus is unremarkable in appearance. The ovaries are relatively symmetric. No suspicious adnexal masses are seen. No inguinal lymphadenopathy is seen.  No acute osseous abnormalities are identified.  Review of the MIP images confirms the above findings.  IMPRESSION: 1. No evidence of aortic dissection. No evidence of aneurysmal dilatation. The superior and inferior mesenteric arteries remain patent. 2. Soft tissue inflammation and trace fluid tracking about small bowel loops at the lower abdomen, posterior to a moderate umbilical hernia that contains a short segment of ileum. No definite evidence of obstruction, as there are no dilated small bowel loops. This may reflect some degree of  dysmotility and mesenteric venous congestion due to incarceration. Small amount of fluid noted within the umbilical hernia. 3. Nonobstructing bilateral renal stones noted, measuring up to 7 mm in size on the left. These results were called by telephone at the time of interpretation on 09/05/2015 at 12:48 am to Dr. Charlesetta Ivory, who verbally acknowledged these results.   Electronically Signed   By: Garald Balding M.D.   On: 09/05/2015 00:49    EKG: Normal sinus rhythm without  evidence of acute myocardial infarction  Weights: Filed Weights   09/04/15 2130  Weight: 329 lb (149.233 kg)     Physical Exam: Blood pressure 124/52, pulse 97, temperature 98.4 F (36.9 C), temperature source Oral, resp. rate 23, height 5\' 8"  (1.727 m), weight 329 lb (149.233 kg), SpO2 94 %. Body mass index is 50.04 kg/(m^2). General: Well developed, well nourished, in no acute distress. Head eyes ears nose throat: Normocephalic, atraumatic, sclera non-icteric, no xanthomas, nares are without discharge. No apparent thyromegaly and/or mass  Lungs: Normal respiratory effort.  Diffuse wheezes, no rales, no rhonchi.  Heart: RRR with normal S1 S2. no murmur gallop, no rub, PMI is normal size and placement, carotid upstroke normal without bruit, jugular venous pressure is normal Abdomen: Soft,  tender,  distended with normoactive bowel sounds. No hepatomegaly. No rebound/guarding. No obvious abdominal masses. Abdominal aorta is not felt or heard due to increased abdominal girth  Extremities: Trace to 1+ edema. no cyanosis, no clubbing, no ulcers  Peripheral : 2+ bilateral upper extremity pulses, 2+ bilateral femoral pulses, 2+ bilateral dorsal pedal pulse Neuro: Alert and oriented. No facial asymmetry. No focal deficit. Moves all extremities spontaneously. Musculoskeletal: Normal muscle tone without kyphosis Psych:  Responds to questions appropriately with a normal affect.    Assessment: 64 year old female with diabetes  mixed hyperlipidemia and essential hypertension with vascular disease in the past post surgery after umbilical hernia incarceration with elevated troponin more consistent with current illness and demand ischemia rather than acute coronary syndrome  Plan: 1. No further cardiac diagnostics necessary at this time due to no evidence of myocardial infarction of heart failure or worsening chest pain 2. Continue diabetes and hypertension control with appropriate medication management 3. No restrictions to rehabilitation 4. Ambulation and follow for any further significant symptoms requiring further intervention above  Signed, Corey Skains M.D. [facility] 09/05/2015, 1:25 PM

## 2015-09-05 NOTE — ED Notes (Signed)
Spoke with OR RN at this time.

## 2015-09-05 NOTE — Anesthesia Preprocedure Evaluation (Addendum)
Anesthesia Evaluation  Patient identified by MRN, date of birth, ID band Patient awake    Reviewed: Allergy & Precautions, H&P , NPO status , Patient's Chart, lab work & pertinent test results  History of Anesthesia Complications Negative for: history of anesthetic complications  Airway Mallampati: [name]  TM Distance: >3 FB Neck ROM: full    Dental  (+) Poor Dentition, Chipped, Missing, Loose   Pulmonary shortness of breath, former smoker,    Pulmonary exam normal breath sounds clear to auscultation       Cardiovascular Exercise Tolerance: Poor hypertension, + CAD, + Cardiac Stents and + Peripheral Vascular Disease  (-) Past MI Normal cardiovascular exam Rhythm:regular Rate:Normal     Neuro/Psych  Headaches, PSYCHIATRIC DISORDERS Depression  Neuromuscular disease    GI/Hepatic Neg liver ROS, PUD, neg GERD  ,  Endo/Other  diabetesMorbid obesity  Renal/GU negative Renal ROS  negative genitourinary   Musculoskeletal  (+) Arthritis ,   Abdominal   Peds  Hematology negative hematology ROS (+)   Anesthesia Other Findings Past Medical History:   Arthritis                                                    Fibromyalgia affecting shoulder region                       Diabetes mellitus without complication (HCC)                 Cellulitis of both lower extremities                           Comment:Chronic   Reproductive/Obstetrics negative OB ROS                            Anesthesia Physical Anesthesia Plan  ASA: [name] and emergent  Anesthesia Plan: General ETT   Post-op Pain Management:    Induction:   Airway Management Planned:   Additional Equipment:   Intra-op Plan:   Post-operative Plan:   Informed Consent: I have reviewed the patients History and Physical, chart, labs and discussed the procedure including the risks, benefits and alternatives for the proposed anesthesia with the  patient or authorized representative who has indicated his/her understanding and acceptance.   Dental Advisory Given  Plan Discussed with: Anesthesiologist, CRNA and Surgeon  Anesthesia Plan Comments:         Anesthesia Quick Evaluation

## 2015-09-05 NOTE — Anesthesia Postprocedure Evaluation (Signed)
  Anesthesia Post-op Note  Patient: [name] [name]  Procedure(s) Performed: Procedure(s): HERNIA REPAIR incarcerated UMBILICAL ADULT (N/A)  Anesthesia type:General ETT  Patient location: PACU  Post pain: Pain level controlled  Post assessment: Post-op Vital signs reviewed, Patient's Cardiovascular Status Stable, Respiratory Function Stable, Patent Airway and No signs of Nausea or vomiting  Post vital signs: Reviewed and stable  Last Vitals:  Filed Vitals:   09/05/15 2208  BP: 116/55  Pulse: 66  Temp: 36.8 C  Resp:     Level of consciousness: awake, alert  and patient cooperative  Complications: No apparent anesthesia complications

## 2015-09-05 NOTE — Progress Notes (Signed)
*  PRELIMINARY RESULTS* Echocardiogram 2D Echocardiogram has been performed.  [name] [name] 09/05/2015, 2:34 PM

## 2015-09-05 NOTE — Transfer of Care (Signed)
Immediate Anesthesia Transfer of Care Note  Patient: [name] [name]  Procedure(s) Performed: Procedure(s): HERNIA REPAIR incarcerated UMBILICAL ADULT (N/A)  Patient Location: PACU  Anesthesia Type:General  Level of Consciousness: alert   Airway & Oxygen Therapy: Patient Spontanous Breathing  Post-op Assessment: Report given to RN  Post vital signs: Reviewed  Last Vitals:  Filed Vitals:   09/05/15 0345  BP:   Pulse: 88  Temp:   Resp:     Complications: No apparent anesthesia complications

## 2015-09-05 NOTE — Progress Notes (Signed)
Surgery Progress Note  S: Much more comfortable.  Will advance diet to clear liquid.

## 2015-09-05 NOTE — Op Note (Signed)
09/04/2015 - 09/05/2015  5:26 AM  PATIENT:  [name]  64 y.o. female  PRE-OPERATIVE DIAGNOSIS:  incarcerated umbilical hernia  POST-OPERATIVE DIAGNOSIS:  incarcerated umbilical hernia  PROCEDURE:  Procedure(s): HERNIA REPAIR incarcerated UMBILICAL ADULT (N/A)  SURGEON:  Surgeon(s) and Role:    Sherri Rad, MD - Primary  ASSISTANTS: scrub tech  ANESTHESIA:general endotracheal      SPECIMEN:hernia sac and skin     EBL: minimal  Description of procedure:    With informed consent supine position general endotracheal anesthesia sterile prep and drape of the abdomen was undertaken followed by a timeout.   A midline skin incision was fashion encircling the umbilicus. Hernia sac became immediately evident. It was dissected free sharply from the subcutaneous niece tissues circumferentially. The overlying umbilical skin was very thin friable and was excised with the specimen.   The hernia sac was opened. Clear serous fluid was drained. A large amount of omentum was encountered. Small bowel into the wound with Babcock clamp demonstrating no evidence of ischemic changes. The omentum was then gently returned back into the abdomen. The hernia defect was approximately 1 cm x 2 cm in transverse orientation.  The hernia sac was excised down to the fascial edges. The hernia defect easily reapproximated along the transverse orientation in a pants over vest technique utilizing #0 Ethibond suture.    With the fascia closed the deep layer of the wound was reapproximated with interrupted inverted 2-0 Vicryl suture. Deep dermal sutures of 3-0 Vicryl suture were placed. Staples were then used to reapproximate skin edges. Honeycomb dressing was placed. The patient was subsequently extubated and then taken to the recovery room in stable and satisfactory condition.   Hortencia Conradi, MD FACS

## 2015-09-05 NOTE — Progress Notes (Signed)
Notified Dr Leslye Peer that pt troponin result back and had risen to 1.0; Dr acknowledged, stated he was aware; no new orders

## 2015-09-05 NOTE — ED Notes (Signed)
Patient transported to CT 

## 2015-09-05 NOTE — Progress Notes (Signed)
Notified Dr Alvino Chapel of pt elevated troponins; Dr acknowledged. No new orders received. Dr stated would look over chart

## 2015-09-05 NOTE — Progress Notes (Signed)
Surgery Progress Note  S: Still with periumbilical pain.  Troponins elevated to 0.6 from .16 O:Blood pressure 121/58, pulse 90, temperature 97.7 F (36.5 C), temperature source Oral, resp. rate 23, height 5\' 8"  (1.727 m), weight 329 lb (149.233 kg), SpO2 94 %. GEN: NAD/A&Ox3 ABD: soft, min tender, nondistended, dressing c/d/i  Troponin 0.6 EKG: Similar to EKG from 2014  A/P 64 yo s/p incarcerated UHR, doing well - Have spoken with Dr. Nehemiah Massed with Cards regarding troponin elevated (he performed stent in 2014) - NPO for now

## 2015-09-05 NOTE — Consult Note (Signed)
[facility] NAME: [name] [name]    MR#:  [id number]  DATE OF BIRTH:  Mar 07, 1951  DATE OF ADMISSION:  09/04/2015  PRIMARY CARE PHYSICIAN: Margarita Rana, MD   REQUESTING/REFERRING PHYSICIAN: Dr. Rexene Edison  CHIEF COMPLAINT:   Chief Complaint  Patient presents with  . Abdominal Pain  . Back Pain    HISTORY OF PRESENT ILLNESS:  [name] [name]  is a 64 y.o. female with a known history of coronary artery disease, rheumatoid arthritis, type 2 diabetes. She presented urgently to the ER with abdominal pain and nausea and dry heaving and she was found to have an incarcerated hernia. She was brought urgently to the operating room for repair. She was found to have a borderline troponin and serial troponins were increasing. The patient had some shortness of breath after surgery but was relieved with nebulizer treatments. No complaints of chest pain. Hospitalist services were contacted for medical management.  PAST MEDICAL HISTORY:   Past Medical History  Diagnosis Date  . Arthritis   . Fibromyalgia affecting shoulder region   . Diabetes mellitus without complication ([place])   . Cellulitis of both lower extremities     Chronic  . Coronary artery disease   . Fibromyalgia     PAST SURGICAL HISTOIRY:   Past Surgical History  Procedure Laterality Date  . Medial partial knee replacement  01/06/2014  . Vascular stent  11/04/2013  . Anal fissurectomy  01/2004  . Hernia repair      SOCIAL HISTORY:   Social History  Substance Use Topics  . Smoking status: Former Smoker    Quit date: 12/03/1996  . Smokeless tobacco: Never Used  . Alcohol Use: No    FAMILY HISTORY:   Family History  Problem Relation Age of Onset  . Hypertension Mother   . Cataracts Mother   . Thyroid disease Mother   . COPD Father   . Dementia Father   . Cataracts Father   . Aneurysm Father     Abdominal & Brain  . Breast cancer Sister   . Fibromyalgia  Sister   . Migraines Sister   . Hypertension Sister   . Breast cancer Sister   . COPD Sister   . Uterine cancer Sister   . Migraines Brother   . Heart attack Brother     DRUG ALLERGIES:   Allergies  Allergen Reactions  . Latex     itching  . Doxycycline Rash  . Sulfa Antibiotics Itching, Rash and Swelling    REVIEW OF SYSTEMS:  CONSTITUTIONAL: No fever. Positive for fatigue. Positive for weight loss 23 pounds which she is trying to do. EYES: No blurred or double vision. Wears glasses.  EARS, NOSE, AND THROAT: No tinnitus or ear pain. No dysphagia after surgery. RESPIRATORY: Dry cough cough, some shortness of breath after surgery, no wheezing or hemoptysis.  CARDIOVASCULAR: No chest pain, positive for edema.  GASTROINTESTINAL: Positive for nausea and dry heaves, no vomiting, positive for abdominal pain.  GENITOURINARY: No dysuria, hematuria.  ENDOCRINE: No polyuria, nocturia,  HEMATOLOGY: No anemia, easy bruising or bleeding SKIN: Chronic lower extremity cellulitis MUSCULOSKELETAL: Positive for arthritis pain mostly in the knees and feet and hands  NEUROLOGIC: No tingling, numbness, weakness.  PSYCHIATRY: Positive for anxiety or depression.   MEDICATIONS AT HOME:   Prior to Admission medications   Medication Sig Start Date End Date Taking? Authorizing Provider  ACCU-CHEK AVIVA PLUS test strip USE 1 STRIP DAILY 03/31/15  Yes Historical Provider, MD  aspirin 81 MG tablet Take by mouth.   Yes Historical Provider, MD  B-D INS SYR ULTRAFINE 1CC/30G 30G X 1/2" 1 ML MISC Inject 1 Syringe into the skin See admin instructions. 07/28/15  Yes Historical Provider, MD  cetirizine (ZYRTEC) 10 MG tablet Take 10 mg by mouth daily. 02/16/15  Yes Historical Provider, MD  citalopram (CELEXA) 40 MG tablet Take 40 mg by mouth daily. 03/31/15  Yes Historical Provider, MD  folic acid (FOLVITE) 1 MG tablet TAKE 1 TABLET (1 MG TOTAL) BY MOUTH ONCE DAILY.(*NOT COVER*) 05/11/15  Yes Historical Provider, MD   glipiZIDE (GLUCOTROL XL) 2.5 MG 24 hr tablet Take 2.5 mg by mouth daily. 03/31/15  Yes Historical Provider, MD  hydroxychloroquine (PLAQUENIL) 200 MG tablet Take 1 tablet by mouth daily. 06/15/15  Yes Historical Provider, MD  Javier Docker Oil 1000 MG CAPS Take by mouth.   Yes Historical Provider, MD  methotrexate 50 MG/2ML injection Inject 0.6 mLs into the skin every 7 (seven) days. 06/15/15  Yes Historical Provider, MD  oxyCODONE-acetaminophen (PERCOCET) 10-325 MG per tablet Take 1 tablet by mouth every 4 (four) hours as needed. 08/09/15  Yes Margarita Rana, MD  penicillin v potassium (VEETID) 250 MG tablet Take 1 tablet by mouth 2 (two) times daily. 07/27/15  Yes Historical Provider, MD  furosemide (LASIX) 40 MG tablet Take by mouth. 11/05/14   Historical Provider, MD  inFLIXimab (REMICADE) 100 MG injection Inject into the vein.    Historical Provider, MD  potassium chloride SA (K-DUR,KLOR-CON) 20 MEQ tablet Take by mouth. 11/05/14   Historical Provider, MD  solifenacin (VESICARE) 10 MG tablet Take by mouth. 08/16/14   Historical Provider, MD      VITAL SIGNS:  Blood pressure 124/52, pulse 97, temperature 98.4 F (36.9 C), temperature source Oral, resp. rate 23, height 5\' 8"  (1.727 m), weight 149.233 kg (329 lb), SpO2 94 %.  PHYSICAL EXAMINATION:  GENERAL:  64 y.o.-year-old obese patient sitting in chair  with no acute distress.  EYES: Pupils equal, round, reactive to light and accommodation. No scleral icterus. Extraocular muscles intact.  HEENT: Head atraumatic, normocephalic. Oropharynx and nasopharynx clear.  NECK:  Supple, no jugular venous distention. No thyroid enlargement, no tenderness.  LUNGS: Normal breath sounds bilaterally, no wheezing, rales,rhonchi or crepitation. No use of accessory muscles of respiration.  CARDIOVASCULAR: S1, S2 normal. No murmurs, rubs, or gallops.  ABDOMEN: Soft, positive for tenderness generalized in abdomen, nondistended. Bowel sounds present. No organomegaly or mass.   EXTREMITIES: 2+ edema, no cyanosis, or clubbing.  NEUROLOGIC: Cranial nerves II through XII are intact. Muscle strength 5/5 in all extremities. Sensation intact. Gait not checked.  PSYCHIATRIC: The patient is alert and oriented x 3.  SKIN: Chronic lower extremity discoloration  LABORATORY PANEL:   CBC  Recent Labs Lab 09/04/15 2209  WBC 11.5*  HGB 13.5  HCT 41.1  PLT 274   ------------------------------------------------------------------------------------------------------------------  Chemistries   Recent Labs Lab 09/04/15 2209  NA 135  K 4.4  CL 100*  CO2 29  GLUCOSE 180*  BUN 11  CREATININE 0.66  CALCIUM 9.7  AST 33  ALT 22  ALKPHOS 56  BILITOT 0.9   ------------------------------------------------------------------------------------------------------------------  Cardiac Enzymes  Recent Labs Lab 09/05/15 1150  TROPONINI 1.00*   ------------------------------------------------------------------------------------------------------------------  RADIOLOGY:  Dg Chest 2 View  09/04/2015   CLINICAL DATA:  Acute onset of generalized abdominal pain. Initial encounter.  EXAM: CHEST  2 VIEW  COMPARISON:  Chest radiograph performed 10/20/2014  FINDINGS: The lungs are well-aerated and clear. There is no evidence of focal opacification, pleural effusion or pneumothorax.  The heart is normal in size; the mediastinal contour is within normal limits. No acute osseous abnormalities are seen.  IMPRESSION: No acute cardiopulmonary process seen.   Electronically Signed   By: Garald Balding M.D.   On: 09/04/2015 23:40   Ct Cta Abd/pel W/cm &/or W/o Cm  09/05/2015   CLINICAL DATA:  Acute onset of lower abdominal pain and pelvic pain, radiating to the lower back. Initial encounter.  EXAM: CTA ABDOMEN AND PELVIS wITHOUT AND WITH CONTRAST  TECHNIQUE: Multidetector CT imaging of the abdomen and pelvis was performed using the standard protocol during bolus administration of  intravenous contrast. Multiplanar reconstructed images and MIPs were obtained and reviewed to evaluate the vascular anatomy.  CONTRAST:  124mL OMNIPAQUE IOHEXOL 350 MG/ML SOLN  COMPARISON:  CT of the abdomen and pelvis performed 07/23/2011  FINDINGS: The visualized lung bases are clear.  There is no evidence of aortic dissection. There is no evidence of aneurysmal dilatation. Minimal calcification is noted along the common iliac arteries bilaterally. No luminal narrowing is seen. The superior mesenteric artery and inferior mesenteric artery remain fully patent, without evidence for ischemia.  The celiac trunk and bilateral renal arteries are also patent. The inferior vena cava is unremarkable in appearance.  Soft tissue inflammation and trace fluid are seen tracking about small bowel loops at the lower abdomen, posterior to a moderate umbilical hernia that contains a short segment of ileum. There is no definite evidence of obstruction, as no dilated small bowel loops are seen. This may simply reflect some degree of dysmotility and mesenteric congestion due to incarceration. A small amount of fluid is noted within the umbilical hernia.  The liver and spleen are unremarkable in appearance. A stone is noted dependently within the gallbladder. The gallbladder is otherwise unremarkable. The pancreas and adrenal glands are unremarkable.  A 7 mm nonobstructing stone is noted at the posterior aspect of the left kidney. A few nonobstructing right renal stones are also suggested. There is no evidence hydronephrosis. No obstructing ureteral stones are identified. No significant stranding.  No free fluid is identified. The small bowel is unremarkable in appearance. The stomach is within normal limits. No acute vascular abnormalities are seen.  The appendix is not definitely characterized; there is no evidence for appendicitis. The colon is unremarkable in appearance.  The bladder is mildly distended and grossly unremarkable.  The uterus is unremarkable in appearance. The ovaries are relatively symmetric. No suspicious adnexal masses are seen. No inguinal lymphadenopathy is seen.  No acute osseous abnormalities are identified.  Review of the MIP images confirms the above findings.  IMPRESSION: 1. No evidence of aortic dissection. No evidence of aneurysmal dilatation. The superior and inferior mesenteric arteries remain patent. 2. Soft tissue inflammation and trace fluid tracking about small bowel loops at the lower abdomen, posterior to a moderate umbilical hernia that contains a short segment of ileum. No definite evidence of obstruction, as there are no dilated small bowel loops. This may reflect some degree of dysmotility and mesenteric venous congestion due to incarceration. Small amount of fluid noted within the umbilical hernia. 3. Nonobstructing bilateral renal stones noted, measuring up to 7 mm in size on the left. These results were called by telephone at the time of interpretation on 09/05/2015 at 12:48 am to Dr. Charlesetta Ivory, who verbally acknowledged these results.   Electronically Signed   By: Jacqulynn Cadet  Chang M.D.   On: 09/05/2015 00:49    EKG:   Normal sinus rhythm 92 bpm left axis deviation and low voltage and poor R-wave progression  IMPRESSION AND PLAN:   1. Elevated troponin trending upwards. History of coronary artery disease. Last troponin 1.0. Patient does not have any chest pain or shortness of breath at this point. This could be demand ischemia from incarcerated hernia and surgery. The patient also had accelerated hypertension on presentation. Now blood pressures normalized.  I will have cardiology evaluate and make recommendations. Patient is currently on aspirin. 2. Type 2 diabetes mellitus- I will change the IV fluids without sugar and it. Continue sliding scale while nothing by mouth. 3. Rheumatoid arthritis- continue plaquenil, patient also takes methotrexate injections. Patient states that she does  not take Remicade anymore. 4. Chronic low [place] cellulitis on suppressive antibiotics. Patiently currently on Zosyn for the hernia. 5. Nursing staff states that the patient when she walks around she desaturates easily. I will check a pulse ox on room air after ambulation in the morning. 6. Morbid obesity weight loss needed. 7. History of fibromyalgia 8. Anxiety depression on Celexa.   All the records are reviewed and case discussed with Consulting provider. Management plans discussed with the patient, family and they are in agreement.  CODE STATUS: Full code  TOTAL TIME TAKING CARE OF THIS PATIENT: 50 minutes.    Loletha Grayer M.D on 09/05/2015 at 12:44 PM  Between 7am to 6pm - Pager - [phone]  After 6pm go to [email or web address] - password [email or web address] [facility]  [facility]  Office  [phone]  CC: Primary care Physician: Margarita Rana, MD

## 2015-09-05 NOTE — ED Notes (Signed)
Family has been given personal items of patient.

## 2015-09-05 NOTE — ED Notes (Signed)
Pt back from CT

## 2015-09-05 NOTE — H&P (Signed)
[name] [name] is an 64 y.o. female.     Chief Complaint:  Sudden onset periumbilical and lower abdominal pain at noon Sunday.   HPI:   64 year old white female with a history of morbid obesity and rheumatoid arthritis on methotrexate and Plaquenil and Remicade presents to the emergency room with sudden onset of periumbilical abdominal pain radiating to her lower abdomen which began acutely approximately at noon on Sunday. Over the course of the day progressed becoming constant severe with associated nausea no vomiting no change in bowel habits no fever. Upon presentation to the emergency room a CT scan was obtained to rule out intra-abdominal or vascular etiology.  Review of CT scan demonstrates findings consistent with an incarcerated umbilical hernia containing small bowel.   No free air is seen.   A scant amount of fluid seen on CT scan associated with the bowel contained within the hernia defect.   She denies a prior history of abdominal operations nor hernia repair. She's had an umbilical hernia for some time.     Past Medical History  Diagnosis Date  . Arthritis   . Fibromyalgia affecting shoulder region   . Diabetes mellitus without complication ([place])   . Cellulitis of both lower extremities     Chronic    Past Surgical History  Procedure Laterality Date  . Medial partial knee replacement  01/06/2014  . Vascular stent  11/04/2013  . Anal fissurectomy  01/2004    Family History  Problem Relation Age of Onset  . Hypertension Mother   . Cataracts Mother   . Thyroid disease Mother   . COPD Father   . Dementia Father   . Cataracts Father   . Aneurysm Father     Abdominal & Brain  . Breast cancer Sister   . Fibromyalgia Sister   . Migraines Sister   . Hypertension Sister   . Breast cancer Sister   . COPD Sister   . Uterine cancer Sister   . Migraines Brother   . Heart attack Brother    Social History:  reports that she quit smoking about 18 years ago. She has never used  smokeless tobacco. She reports that she does not drink alcohol or use illicit drugs.  Allergies:  Allergies  Allergen Reactions  . Latex     itching  . Doxycycline Rash  . Sulfa Antibiotics Itching, Rash and Swelling    Review of Systems  Constitutional: Negative for fever, chills, weight loss, malaise/fatigue and diaphoresis.  HENT: Negative.   Eyes: Negative.   Respiratory: Negative for cough and hemoptysis.   Cardiovascular: Negative for chest pain, palpitations and orthopnea.  Gastrointestinal: Positive for nausea and abdominal pain. Negative for vomiting, diarrhea and constipation.  Genitourinary: Negative for dysuria and urgency.  Musculoskeletal: Negative.   Skin: Negative for itching and rash.  Neurological: Positive for weakness.  Endo/Heme/Allergies: Negative.   Psychiatric/Behavioral: Negative.     Physical Exam:  Physical Exam  Constitutional: She is oriented to person, place, and time and well-developed, well-nourished, and in no distress. No distress.  HENT:  Head: Normocephalic and atraumatic.  Eyes: Conjunctivae are normal. Pupils are equal, round, and reactive to light.  Cardiovascular: Normal rate and regular rhythm.   Pulmonary/Chest: Effort normal and breath sounds normal. No respiratory distress. She has no wheezes.  Abdominal: Soft. She exhibits mass. She exhibits no distension. There is tenderness in the periumbilical area, left upper quadrant and left lower quadrant. There is no rebound and no guarding.  Neurological: She is oriented to person, place, and time.  Skin: Skin is warm. She is not diaphoretic.  Psychiatric: Mood, memory, affect and judgment normal.    Blood pressure 150/98, pulse 84, temperature 98.5 F (36.9 C), temperature source Oral, resp. rate 22, height '5\' 8"'  (1.727 m), weight 329 lb (149.233 kg), SpO2 94 %.  Results for orders placed or performed during the hospital encounter of 09/04/15 (from the past 48 hour(s))  Lipase,  blood     Status: Abnormal   Collection Time: 09/04/15 10:09 PM  Result Value Ref Range   Lipase 20 (L) 22 - 51 U/L  Comprehensive metabolic panel     Status: Abnormal   Collection Time: 09/04/15 10:09 PM  Result Value Ref Range   Sodium 135 135 - 145 mmol/L   Potassium 4.4 3.5 - 5.1 mmol/L    Comment: HEMOLYSIS AT THIS LEVEL MAY AFFECT RESULT   Chloride 100 (L) 101 - 111 mmol/L   CO2 29 22 - 32 mmol/L   Glucose, Bld 180 (H) 65 - 99 mg/dL   BUN 11 6 - 20 mg/dL   Creatinine, Ser 0.66 0.44 - 1.00 mg/dL   Calcium 9.7 8.9 - 10.3 mg/dL   Total Protein 7.9 6.5 - 8.1 g/dL   Albumin 3.7 3.5 - 5.0 g/dL   AST 33 15 - 41 U/L   ALT 22 14 - 54 U/L   Alkaline Phosphatase 56 38 - 126 U/L   Total Bilirubin 0.9 0.3 - 1.2 mg/dL   GFR calc non Af Amer >60 >60 mL/min   GFR calc Af Amer >60 >60 mL/min    Comment: (NOTE) The eGFR has been calculated using the CKD EPI equation. This calculation has not been validated in all clinical situations. eGFR's persistently <60 mL/min signify possible Chronic Kidney Disease.    Anion gap 6 5 - 15  CBC     Status: Abnormal   Collection Time: 09/04/15 10:09 PM  Result Value Ref Range   WBC 11.5 (H) 3.6 - 11.0 K/uL   RBC 5.08 3.80 - 5.20 MIL/uL   Hemoglobin 13.5 12.0 - 16.0 g/dL   HCT 41.1 35.0 - 47.0 %   MCV 81.0 80.0 - 100.0 fL   MCH 26.6 26.0 - 34.0 pg   MCHC 32.9 32.0 - 36.0 g/dL   RDW 17.5 (H) 11.5 - 14.5 %   Platelets 274 150 - 440 K/uL  Urinalysis complete, with microscopic ([facility] only)     Status: Abnormal   Collection Time: 09/04/15 10:09 PM  Result Value Ref Range   Color, Urine YELLOW (A) YELLOW   APPearance TURBID (A) CLEAR   Glucose, UA NEGATIVE NEGATIVE mg/dL   Bilirubin Urine NEGATIVE NEGATIVE   Ketones, ur 1+ (A) NEGATIVE mg/dL   Specific Gravity, Urine 1.016 1.005 - 1.030   Hgb urine dipstick NEGATIVE NEGATIVE   pH 7.0 5.0 - 8.0   Protein, ur NEGATIVE NEGATIVE mg/dL   Nitrite NEGATIVE NEGATIVE   Leukocytes, UA NEGATIVE NEGATIVE    RBC / HPF 0-5 0 - 5 RBC/hpf   WBC, UA 0-5 0 - 5 WBC/hpf   Bacteria, UA RARE (A) NONE SEEN   Squamous Epithelial / LPF 0-5 (A) NONE SEEN   Mucous PRESENT    Amorphous Crystal PRESENT   Troponin I     Status: Abnormal   Collection Time: 09/04/15 10:09 PM  Result Value Ref Range   Troponin I 0.16 (H) <0.031 ng/mL    Comment: READ BACK AND  VERIFIED BY RACHEL HAYDEN AT 6256 ON 09/04/15 [facility]        PERSISTENTLY INCREASED TROPONIN VALUES IN THE RANGE OF 0.04-0.49 ng/mL CAN BE SEEN IN:       -UNSTABLE ANGINA       -CONGESTIVE HEART FAILURE       -MYOCARDITIS       -CHEST TRAUMA       -ARRYHTHMIAS       -LATE PRESENTING MYOCARDIAL INFARCTION       -COPD   CLINICAL FOLLOW-UP RECOMMENDED.   Lactic acid, plasma     Status: None   Collection Time: 09/05/15 12:56 AM  Result Value Ref Range   Lactic Acid, Venous 1.4 0.5 - 2.0 mmol/L   Dg Chest 2 View  09/04/2015   CLINICAL DATA:  Acute onset of generalized abdominal pain. Initial encounter.  EXAM: CHEST  2 VIEW  COMPARISON:  Chest radiograph performed 10/20/2014  FINDINGS: The lungs are well-aerated and clear. There is no evidence of focal opacification, pleural effusion or pneumothorax.  The heart is normal in size; the mediastinal contour is within normal limits. No acute osseous abnormalities are seen.  IMPRESSION: No acute cardiopulmonary process seen.   Electronically Signed   By: Garald Balding M.D.   On: 09/04/2015 23:40   Ct Cta Abd/pel W/cm &/or W/o Cm  09/05/2015   CLINICAL DATA:  Acute onset of lower abdominal pain and pelvic pain, radiating to the lower back. Initial encounter.  EXAM: CTA ABDOMEN AND PELVIS wITHOUT AND WITH CONTRAST  TECHNIQUE: Multidetector CT imaging of the abdomen and pelvis was performed using the standard protocol during bolus administration of intravenous contrast. Multiplanar reconstructed images and MIPs were obtained and reviewed to evaluate the vascular anatomy.  CONTRAST:  122m OMNIPAQUE IOHEXOL 350 MG/ML  SOLN  COMPARISON:  CT of the abdomen and pelvis performed 07/23/2011  FINDINGS: The visualized lung bases are clear.  There is no evidence of aortic dissection. There is no evidence of aneurysmal dilatation. Minimal calcification is noted along the common iliac arteries bilaterally. No luminal narrowing is seen. The superior mesenteric artery and inferior mesenteric artery remain fully patent, without evidence for ischemia.  The celiac trunk and bilateral renal arteries are also patent. The inferior vena cava is unremarkable in appearance.  Soft tissue inflammation and trace fluid are seen tracking about small bowel loops at the lower abdomen, posterior to a moderate umbilical hernia that contains a short segment of ileum. There is no definite evidence of obstruction, as no dilated small bowel loops are seen. This may simply reflect some degree of dysmotility and mesenteric congestion due to incarceration. A small amount of fluid is noted within the umbilical hernia.  The liver and spleen are unremarkable in appearance. A stone is noted dependently within the gallbladder. The gallbladder is otherwise unremarkable. The pancreas and adrenal glands are unremarkable.  A 7 mm nonobstructing stone is noted at the posterior aspect of the left kidney. A few nonobstructing right renal stones are also suggested. There is no evidence hydronephrosis. No obstructing ureteral stones are identified. No significant stranding.  No free fluid is identified. The small bowel is unremarkable in appearance. The stomach is within normal limits. No acute vascular abnormalities are seen.  The appendix is not definitely characterized; there is no evidence for appendicitis. The colon is unremarkable in appearance.  The bladder is mildly distended and grossly unremarkable. The uterus is unremarkable in appearance. The ovaries are relatively symmetric. No suspicious adnexal masses are seen.  No inguinal lymphadenopathy is seen.  No acute osseous  abnormalities are identified.  Review of the MIP images confirms the above findings.  IMPRESSION: 1. No evidence of aortic dissection. No evidence of aneurysmal dilatation. The superior and inferior mesenteric arteries remain patent. 2. Soft tissue inflammation and trace fluid tracking about small bowel loops at the lower abdomen, posterior to a moderate umbilical hernia that contains a short segment of ileum. No definite evidence of obstruction, as there are no dilated small bowel loops. This may reflect some degree of dysmotility and mesenteric venous congestion due to incarceration. Small amount of fluid noted within the umbilical hernia. 3. Nonobstructing bilateral renal stones noted, measuring up to 7 mm in size on the left. These results were called by telephone at the time of interpretation on 09/05/2015 at 12:48 am to Dr. Charlesetta Ivory, who verbally acknowledged these results.   Electronically Signed   By: Garald Balding M.D.   On: 09/05/2015 00:49    I personally reviewed the CT scan images on the PACS monitor and agree with the above interpretation.  Assessment/Plan  This is a 64 year old morbidly obese white female with a history of rheumatoid arthritis type 2 diabetes who presents with an acute incarceration of a known umbilical hernia containing small bowel. Concern over compromising the small bowel is indeed present and she will require urgent operative intervention with possible small bowel resection. I discussed with her and her husband serious nature of this problem including the risks of infection recurrence of the hernia given the inability to place a synthetic mesh all other questions were answered and they wished to proceed.  Hortencia Conradi, MD, FACS

## 2015-09-05 NOTE — Anesthesia Procedure Notes (Signed)
Procedure Name: Intubation Date/Time: 09/05/2015 4:21 AM Performed by: Kallie Edward Pre-anesthesia Checklist: Patient identified, Emergency Drugs available, Suction available, Patient being monitored and Timeout performed Patient Re-evaluated:Patient Re-evaluated prior to inductionOxygen Delivery Method: Circle system utilized Preoxygenation: Pre-oxygenation with 100% oxygen Intubation Type: IV induction Laryngoscope Size: Glidescope and 3 Grade View: Grade [name] Tube type: Oral Tube size: 6.5 mm Number of attempts: 1 Airway Equipment and Method: Patient positioned with wedge pillow

## 2015-09-05 NOTE — ED Notes (Signed)
Spoke with Joelene Millin, RN on possible delay for surgery. OR RN will make this RN aware of any updates.

## 2015-09-06 ENCOUNTER — Other Ambulatory Visit: Payer: Self-pay | Admitting: Family Medicine

## 2015-09-06 DIAGNOSIS — M1990 Unspecified osteoarthritis, unspecified site: Secondary | ICD-10-CM

## 2015-09-06 LAB — SURGICAL PATHOLOGY

## 2015-09-06 LAB — GLUCOSE, CAPILLARY
Glucose-Capillary: 130 mg/dL — ABNORMAL HIGH (ref 65–99)
Glucose-Capillary: 130 mg/dL — ABNORMAL HIGH (ref 65–99)
Glucose-Capillary: 121 mg/dL — ABNORMAL HIGH (ref 65–99)

## 2015-09-06 MED ORDER — OXYCODONE-ACETAMINOPHEN 5-325 MG PO TABS: 1.0000 | ORAL_TABLET | ORAL | Status: DC | PRN

## 2015-09-06 MED ORDER — CARVEDILOL 3.125 MG PO TABS: 3.1250 mg | ORAL_TABLET | Freq: Two times a day (BID) | ORAL | Status: DC

## 2015-09-06 MED ORDER — OXYCODONE-ACETAMINOPHEN 10-325 MG PO TABS: 1.0000 | ORAL_TABLET | Freq: Four times a day (QID) | ORAL | Status: DC | PRN

## 2015-09-06 MED ORDER — INFLUENZA VAC SPLIT QUAD 0.5 ML IM SUSY
0.5000 mL | PREFILLED_SYRINGE | Freq: Once | INTRAMUSCULAR | Status: AC
Start: 2015-09-06 — End: 2015-09-06
  Administered 2015-09-06: 0.5 mL via INTRAMUSCULAR
  Filled 2015-09-06: qty 0.5

## 2015-09-06 MED ORDER — CARVEDILOL 6.25 MG PO TABS: 3.1250 mg | ORAL_TABLET | Freq: Two times a day (BID) | ORAL | Status: DC

## 2015-09-06 NOTE — Evaluation (Signed)
Physical Therapy Evaluation Patient Details Name: [name] [name] MRN: [id number] DOB: May 17, 1951 Today's Date: 09/06/2015   History of Present Illness  Pt was admitted to the hospital with abdominal pain which was revealed to be a hernia, and surgically repaired here at the hospital.   Clinical Impression  Pt presents with hx of arthritis, fibromyalgia, DM, cellulitis, and CAD. PT cleared by cardiology to mobilize pt. Nurse notified prior to ambulation. Examination reveals that pt performs transfers at [facility], and ambulation of 120 total ft at [facility] (reference ambulation for vitals). Pt's primary physical deficits include decreased activity tolerance, removing her from her baseline of mobility. She shows fair functional strength with transfers and does not appear unbalanced or wobbly in any way whether using a BRW or not with ambulation and transfers. Due to her decreased activity tolerance, she will continue to benefit from skilled PT in order for her to return home safely. Pt has good support system in husband and sister.    Follow Up Recommendations Home health PT;Supervision/Assistance - 24 hour    Equipment Recommendations  None recommended by PT    Recommendations for Other Services       Precautions / Restrictions Restrictions Weight Bearing Restrictions: No      Mobility  Bed Mobility Overal bed mobility:  (Pt in recliner; not assessed )                Transfers Overall transfer level: Needs assistance Equipment used:  (BRW) Transfers: Sit to/from Stand Sit to Stand: Min guard (CGA for safety in hospital)         General transfer comment: Pt transfers with good strength and stability. Needs cues on hand placement with RW, but no LOB or unsteadiness with transfer   Ambulation/Gait Ambulation/Gait assistance: Min guard Ambulation Distance (Feet): 120 Feet (2 x 60 ft) Assistive device: Rolling walker (2 wheeled);None (2 sets of ambulation for gait assessment ) Gait  Pattern/deviations: Step-through pattern Gait velocity: 1.03 ft/s Gait velocity interpretation: <1.8 ft/sec, indicative of risk for recurrent falls General Gait Details: Pt ambulates generally WFL, but states she feels slightly more stable with RW. No significant gait deviations noticed affecting the safety of her ambulation. Prior to ambulation, pt's O2 sat on room air was 95% and her HR was 74. Following 60 ft ambulation on room air her O2 sat was 90% and her HR was 105 bpm. Both of these returned to baseline within 30 seconds of sitting rest. Pt with no c/o chest pain or significant SOB. Pt was left off of her O2 [place] secondary to good resting O2 and exercise response. Nurse notified of this.   Stairs            Wheelchair Mobility    Modified Rankin (Stroke Patients Only)       Balance Overall balance assessment: No apparent balance deficits (not formally assessed)                                           Pertinent Vitals/Pain Pain Assessment: No/denies pain    Home Living Family/patient expects to be discharged to:: Private residence Living Arrangements: Spouse/significant other (Sister will help 24 hrs/day as well as husband) Available Help at Discharge: Available 24 hours/day;Family Type of Home: House Home Access: Stairs to enter Entrance Stairs-Rails: Can reach both Entrance Stairs-Number of Steps: 3 Home Layout: One level Home Equipment: Kasandra Knudsen -  single point (lift chair, BRW)      Prior Function Level of Independence: Independent with assistive device(s)         Comments: Pt was able to ambulate with BRW and without BRW. Occasionally would use a SPC     Hand Dominance        Extremity/Trunk Assessment   Upper Extremity Assessment: Overall WFL for tasks assessed           Lower Extremity Assessment: Overall WFL for tasks assessed         Communication   Communication: No difficulties  Cognition Arousal/Alertness:  Awake/alert Behavior During Therapy: WFL for tasks assessed/performed Overall Cognitive Status: Within Functional Limits for tasks assessed                      General Comments      Exercises Other Exercises Other Exercises: Pt performed additonal gait training for assessment of need for RW. Pt appears stable with both options, however she was encouraged to use BRW if she feels unstable with walking at home.   (x10 min)      Assessment/Plan    PT Assessment Patient needs continued PT services  PT Diagnosis  (Decreased activity tolerance)   PT Problem List Decreased activity tolerance;Decreased mobility;Decreased knowledge of use of DME;Cardiopulmonary status limiting activity;Obesity  PT Treatment Interventions DME instruction;Gait training;Stair training;Functional mobility training;Therapeutic activities;Therapeutic exercise;Neuromuscular re-education;Balance training;Patient/family education;Wheelchair mobility training;Manual techniques   PT Goals (Current goals can be found in the Care Plan section) Acute Rehab PT Goals Patient Stated Goal: to return home PT Goal Formulation: With patient Time For Goal Achievement: 09/20/15 Potential to Achieve Goals: Good    Frequency Min 2X/week   Barriers to discharge        Co-evaluation               End of Session Equipment Utilized During Treatment: Gait belt Activity Tolerance: Patient tolerated treatment well Patient left: in chair;with call bell/phone within reach;with family/visitor present Nurse Communication: Mobility status (O2 and HR response to mobility )         Time: 0160-1093 PT Time Calculation (min) (ACUTE ONLY): 24 min   Charges:   PT Evaluation $Initial PT Evaluation Tier I: 1 Procedure PT Treatments $Gait Training: 8-22 mins   PT G CodesJanyth Contes 2015-09-12, 4:30 PM  Janyth Contes, SPT. [phone]

## 2015-09-06 NOTE — Progress Notes (Signed)
Patient discharged to home as ordered. Patient to receive flu shot prior to discharge. Patient has 2 follow up appointments one with Dr. Marina Gravel on 09/21/2015 at 1315pm and Dr. Nehemiah Massed on 09/20/2015 at 1000am. Patient is alert and oriented,ambulates well without assistance. IV discontinued site without S/S of infection or infiltration No acute distress noted. Dressing to the abdomen is dry and intact. Patient husband to pick patient up from the hospital. Discharge instructions given as ordered, Patient instructed to eat small meals 4 to 6 times a day, do not drink pop, do not drink alcohol, not to lift anything heavier than 10 lbs, and finally do not get wound wet for 2 days.

## 2015-09-06 NOTE — Progress Notes (Signed)
Physical Therapy Evaluation Patient Details Name: [name] [name] MRN: [id number] DOB: Jan 05, 1951 Today's Date: 09/06/2015   History of Present Illness  Pt was admitted to the hospital with abdominal pain which was revealed to be a hernia, and surgically repaired here at the hospital.   Clinical Impression  Pt presents with hx of arthritis, fibromyalgia, DM, cellulitis, and CAD. Examination reveals that pt performs transfers at [facility], and ambulation of 120 total ft at [facility] (reference ambulation for vitals). Pt's primary physical deficits include decreased activity tolerance, removing her from her baseline of mobility. She shows fair functional strength with transfers and does not appear unbalanced or wobbly in any way whether using a BRW or not with ambulation and transfers. Due to her decreased activity tolerance, she will continue to benefit from skilled PT in order for her to return home safely. Pt has good support system in husband and sister.    Follow Up Recommendations Home health PT;Supervision/Assistance - 24 hour    Equipment Recommendations  None recommended by PT    Recommendations for Other Services       Precautions / Restrictions Restrictions Weight Bearing Restrictions: No      Mobility  Bed Mobility Overal bed mobility:  (Pt in recliner; not assessed )                Transfers Overall transfer level: Needs assistance Equipment used:  (BRW) Transfers: Sit to/from Stand Sit to Stand: Min guard (CGA for safety in hospital)         General transfer comment: Pt transfers with good strength and stability. Needs cues on hand placement with RW, but no LOB or unsteadiness with transfer   Ambulation/Gait Ambulation/Gait assistance: Min guard Ambulation Distance (Feet): 120 Feet (2 x 60 ft) Assistive device: Rolling walker (2 wheeled);None (2 sets of ambulation for gait assessment ) Gait Pattern/deviations: Step-through pattern Gait velocity: 1.03 ft/s Gait  velocity interpretation: <1.8 ft/sec, indicative of risk for recurrent falls General Gait Details: Pt ambulates generally WFL, but states she feels slightly more stable with RW. No significant gait deviations noticed affecting the safety of her ambulation. Prior to ambulation, pt's O2 sat on room air was 95% and her HR was 74. Following 60 ft ambulation on room air her O2 sat was 90% and her HR was 105 bpm. Both of these returned to baseline within 30 seconds of sitting rest. Pt with no c/o chest pain or significant SOB. Pt was left off of her O2 [place] secondary to good resting O2 and exercise response. Nurse notified of this.   Stairs            Wheelchair Mobility    Modified Rankin (Stroke Patients Only)       Balance Overall balance assessment: No apparent balance deficits (not formally assessed)                                           Pertinent Vitals/Pain Pain Assessment: No/denies pain    Home Living Family/patient expects to be discharged to:: Private residence Living Arrangements: Spouse/significant other (Sister will help 24 hrs/day as well as husband) Available Help at Discharge: Available 24 hours/day;Family Type of Home: House Home Access: Stairs to enter Entrance Stairs-Rails: Can reach both Entrance Stairs-Number of Steps: 3 Home Layout: One level Home Equipment: Cane - single point (lift chair, BRW)      Prior  Function Level of Independence: Independent with assistive device(s)         Comments: Pt was able to ambulate with BRW and without BRW. Occasionally would use a SPC     Hand Dominance        Extremity/Trunk Assessment   Upper Extremity Assessment: Overall WFL for tasks assessed           Lower Extremity Assessment: Overall WFL for tasks assessed         Communication   Communication: No difficulties  Cognition Arousal/Alertness: Awake/alert Behavior During Therapy: WFL for tasks assessed/performed Overall  Cognitive Status: Within Functional Limits for tasks assessed                      General Comments      Exercises Other Exercises Other Exercises: Pt performed additonal gait training for assessment of need for RW. Pt appears stable with both options, however she was encouraged to use BRW if she feels unstable with walking at home.   (x10 min)      Assessment/Plan    PT Assessment Patient needs continued PT services  PT Diagnosis  (Decreased activity tolerance)   PT Problem List Decreased activity tolerance;Decreased mobility;Decreased knowledge of use of DME;Cardiopulmonary status limiting activity;Obesity  PT Treatment Interventions DME instruction;Gait training;Stair training;Functional mobility training;Therapeutic activities;Therapeutic exercise;Neuromuscular re-education;Balance training;Patient/family education;Wheelchair mobility training;Manual techniques   PT Goals (Current goals can be found in the Care Plan section) Acute Rehab PT Goals Patient Stated Goal: to return home PT Goal Formulation: With patient Time For Goal Achievement: 09/20/15 Potential to Achieve Goals: Good    Frequency Min 2X/week   Barriers to discharge        Co-evaluation               End of Session Equipment Utilized During Treatment: Gait belt Activity Tolerance: Patient tolerated treatment well Patient left: in chair;with call bell/phone within reach;with family/visitor present Nurse Communication: Mobility status (O2 and HR response to mobility )         Time: 0254-2706 PT Time Calculation (min) (ACUTE ONLY): 24 min   Charges:         PT G CodesJanyth Contes 2015/09/10, 3:06 PM  Janyth Contes, SPT. [phone]

## 2015-09-06 NOTE — Evaluation (Deleted)
Physical Therapy Evaluation Patient Details Name: [name] [name] MRN: [id number] DOB: Mar 01, 1951 Today's Date: 09/06/2015   History of Present Illness  Pt was admitted to the hospital with abdominal pain which was revealed to be a hernia, and surgically repaired here at the hospital.   Clinical Impression  Pt presents with hx of arthritis, fibromyalgia, DM, cellulitis, and CAD. PT cleared by cardiology to mobilize pt. Nurse notified prior to ambulation. Examination reveals that pt performs transfers at [facility], and ambulation of 120 total ft at [facility] (reference ambulation for vitals). Pt's primary physical deficits include decreased activity tolerance, removing her from her baseline of mobility. She shows fair functional strength with transfers and does not appear unbalanced or wobbly in any way whether using a BRW or not with ambulation and transfers. Due to her decreased activity tolerance, she will continue to benefit from skilled PT in order for her to return home safely. Pt has good support system in husband and sister.    Follow Up Recommendations Home health PT;Supervision/Assistance - 24 hour    Equipment Recommendations  None recommended by PT    Recommendations for Other Services       Precautions / Restrictions Restrictions Weight Bearing Restrictions: No      Mobility  Bed Mobility Overal bed mobility:  (Pt in recliner; not assessed )                Transfers Overall transfer level: Needs assistance Equipment used:  (BRW) Transfers: Sit to/from Stand Sit to Stand: Min guard (CGA for safety in hospital)         General transfer comment: Pt transfers with good strength and stability. Needs cues on hand placement with RW, but no LOB or unsteadiness with transfer   Ambulation/Gait Ambulation/Gait assistance: Min guard Ambulation Distance (Feet): 120 Feet (2 x 60 ft) Assistive device: Rolling walker (2 wheeled);None (2 sets of ambulation for gait assessment ) Gait  Pattern/deviations: Step-through pattern Gait velocity: 1.03 ft/s Gait velocity interpretation: <1.8 ft/sec, indicative of risk for recurrent falls General Gait Details: Pt ambulates generally WFL, but states she feels slightly more stable with RW. No significant gait deviations noticed affecting the safety of her ambulation. Prior to ambulation, pt's O2 sat on room air was 95% and her HR was 74. Following 60 ft ambulation on room air her O2 sat was 90% and her HR was 105 bpm. Both of these returned to baseline within 30 seconds of sitting rest. Pt with no c/o chest pain or significant SOB. Pt was left off of her O2 [place] secondary to good resting O2 and exercise response. Nurse notified of this.   Stairs            Wheelchair Mobility    Modified Rankin (Stroke Patients Only)       Balance Overall balance assessment: No apparent balance deficits (not formally assessed)                                           Pertinent Vitals/Pain Pain Assessment: No/denies pain    Home Living Family/patient expects to be discharged to:: Private residence Living Arrangements: Spouse/significant other (Sister will help 24 hrs/day as well as husband) Available Help at Discharge: Available 24 hours/day;Family Type of Home: House Home Access: Stairs to enter Entrance Stairs-Rails: Can reach both Entrance Stairs-Number of Steps: 3 Home Layout: One level Home Equipment: Kasandra Knudsen -  single point (lift chair, BRW)      Prior Function Level of Independence: Independent with assistive device(s)         Comments: Pt was able to ambulate with BRW and without BRW. Occasionally would use a SPC     Hand Dominance        Extremity/Trunk Assessment   Upper Extremity Assessment: Overall WFL for tasks assessed           Lower Extremity Assessment: Overall WFL for tasks assessed         Communication   Communication: No difficulties  Cognition Arousal/Alertness:  Awake/alert Behavior During Therapy: WFL for tasks assessed/performed Overall Cognitive Status: Within Functional Limits for tasks assessed                      General Comments      Exercises Other Exercises Other Exercises: Pt performed additonal gait training for assessment of need for RW. Pt appears stable with both options, however she was encouraged to use BRW if she feels unstable with walking at home.   (x10 min)      Assessment/Plan    PT Assessment Patient needs continued PT services  PT Diagnosis  (Decreased activity tolerance)   PT Problem List Decreased activity tolerance;Decreased mobility;Decreased knowledge of use of DME;Cardiopulmonary status limiting activity;Obesity  PT Treatment Interventions DME instruction;Gait training;Stair training;Functional mobility training;Therapeutic activities;Therapeutic exercise;Neuromuscular re-education;Balance training;Patient/family education;Wheelchair mobility training;Manual techniques   PT Goals (Current goals can be found in the Care Plan section) Acute Rehab PT Goals Patient Stated Goal: to return home PT Goal Formulation: With patient Time For Goal Achievement: 09/20/15 Potential to Achieve Goals: Good    Frequency Min 2X/week   Barriers to discharge        Co-evaluation               End of Session Equipment Utilized During Treatment: Gait belt Activity Tolerance: Patient tolerated treatment well Patient left: in chair;with call bell/phone within reach;with family/visitor present Nurse Communication: Mobility status (O2 and HR response to mobility )         Time: 5852-7782 PT Time Calculation (min) (ACUTE ONLY): 24 min   Charges:         PT G CodesJanyth Contes 10-03-15, 3:27 PM  Janyth Contes, SPT. [phone]

## 2015-09-06 NOTE — Progress Notes (Signed)
[facility] Encounter Note  Patient: [name] [name] / Admit Date: 09/04/2015 / Date of Encounter: 09/06/2015, 8:01 AM   Subjective: Patient is still short of breath and weak but no evidence of chest discomfort  Review of Systems: Positive for: Abdominal discomfort Negative for: Vision change, hearing change, syncope, dizziness, nausea, vomiting,diarrhea, bloody stool,  cough, congestion, diaphoresis, urinary frequency, urinary pain,skin lesions, skin rashes Others previously listed  Objective: Telemetry: Normal sinus rhythm Physical Exam: Blood pressure 124/61, pulse 64, temperature 97.5 F (36.4 C), temperature source Oral, resp. rate 16, height 5\' 8"  (1.727 m), weight 329 lb (149.233 kg), SpO2 100 %. Body mass index is 50.04 kg/(m^2). General: Well developed, well nourished, in no acute distress. Head: Normocephalic, atraumatic, sclera non-icteric, no xanthomas, nares are without discharge. Neck: No apparent masses Lungs: Normal respirations with no wheezes, no rhonchi, no rales , basilar crackles   Heart: Regular rate and rhythm, normal S1 S2, no murmur, no rub, no gallop, PMI is normal size and placement, carotid upstroke normal without bruit, jugular venous pressure normal Abdomen: Soft, tender, distended with normoactive bowel sounds. No apparent hepatosplenomegaly. Abdominal aorta is not heard due to increased abdominal girth Extremities: 1-2+ edema, no clubbing, no cyanosis, no ulcers,  Peripheral: 2+ radial, 2+ femoral, 2+ dorsal pedal pulses Neuro: Alert and oriented. Moves all extremities spontaneously. Psych:  Responds to questions appropriately with a normal affect.   Intake/Output Summary (Last 24 hours) at 09/06/15 0801 Last data filed at 09/06/15 0400  Gross per 24 hour  Intake   1158 ml  Output   2800 ml  Net  -1642 ml    Inpatient Medications:  . aspirin EC  81 mg Oral Daily  . citalopram  40 mg Oral Daily  . darifenacin  7.5 mg Oral  Daily  . enoxaparin (LOVENOX) injection  40 mg Subcutaneous Q12H  . furosemide  20 mg Oral Daily  . hydroxychloroquine  200 mg Oral Daily  . insulin aspart  0-20 Units Subcutaneous TID WC  . insulin aspart  0-5 Units Subcutaneous QHS  . piperacillin-tazobactam (ZOSYN)  IV  3.375 g Intravenous 3 times per day   Infusions:  . 0.9 % NaCl with KCl 20 mEq / L 50 mL/hr at 09/05/15 1330    Labs:  Recent Labs  09/04/15 2209  NA 135  K 4.4  CL 100*  CO2 29  GLUCOSE 180*  BUN 11  CREATININE 0.66  CALCIUM 9.7    Recent Labs  09/04/15 2209  AST 33  ALT 22  ALKPHOS 56  BILITOT 0.9  PROT 7.9  ALBUMIN 3.7    Recent Labs  09/04/15 2209  WBC 11.5*  HGB 13.5  HCT 41.1  MCV 81.0  PLT 274    Recent Labs  09/04/15 2209 09/05/15 0136 09/05/15 1150  TROPONINI 0.16* 0.60* 1.00*   Invalid input(s): POCBNP No results for input(s): HGBA1C in the last 72 hours.   Weights: Filed Weights   09/04/15 2130  Weight: 329 lb (149.233 kg)     Radiology/Studies:  Dg Chest 2 View  09/04/2015   CLINICAL DATA:  Acute onset of generalized abdominal pain. Initial encounter.  EXAM: CHEST  2 VIEW  COMPARISON:  Chest radiograph performed 10/20/2014  FINDINGS: The lungs are well-aerated and clear. There is no evidence of focal opacification, pleural effusion or pneumothorax.  The heart is normal in size; the mediastinal contour is within normal limits. No acute osseous abnormalities are seen.  IMPRESSION: No acute  cardiopulmonary process seen.   Electronically Signed   By: Garald Balding M.D.   On: 09/04/2015 23:40   Ct Cta Abd/pel W/cm &/or W/o Cm  09/05/2015   CLINICAL DATA:  Acute onset of lower abdominal pain and pelvic pain, radiating to the lower back. Initial encounter.  EXAM: CTA ABDOMEN AND PELVIS wITHOUT AND WITH CONTRAST  TECHNIQUE: Multidetector CT imaging of the abdomen and pelvis was performed using the standard protocol during bolus administration of intravenous contrast.  Multiplanar reconstructed images and MIPs were obtained and reviewed to evaluate the vascular anatomy.  CONTRAST:  116mL OMNIPAQUE IOHEXOL 350 MG/ML SOLN  COMPARISON:  CT of the abdomen and pelvis performed 07/23/2011  FINDINGS: The visualized lung bases are clear.  There is no evidence of aortic dissection. There is no evidence of aneurysmal dilatation. Minimal calcification is noted along the common iliac arteries bilaterally. No luminal narrowing is seen. The superior mesenteric artery and inferior mesenteric artery remain fully patent, without evidence for ischemia.  The celiac trunk and bilateral renal arteries are also patent. The inferior vena cava is unremarkable in appearance.  Soft tissue inflammation and trace fluid are seen tracking about small bowel loops at the lower abdomen, posterior to a moderate umbilical hernia that contains a short segment of ileum. There is no definite evidence of obstruction, as no dilated small bowel loops are seen. This may simply reflect some degree of dysmotility and mesenteric congestion due to incarceration. A small amount of fluid is noted within the umbilical hernia.  The liver and spleen are unremarkable in appearance. A stone is noted dependently within the gallbladder. The gallbladder is otherwise unremarkable. The pancreas and adrenal glands are unremarkable.  A 7 mm nonobstructing stone is noted at the posterior aspect of the left kidney. A few nonobstructing right renal stones are also suggested. There is no evidence hydronephrosis. No obstructing ureteral stones are identified. No significant stranding.  No free fluid is identified. The small bowel is unremarkable in appearance. The stomach is within normal limits. No acute vascular abnormalities are seen.  The appendix is not definitely characterized; there is no evidence for appendicitis. The colon is unremarkable in appearance.  The bladder is mildly distended and grossly unremarkable. The uterus is  unremarkable in appearance. The ovaries are relatively symmetric. No suspicious adnexal masses are seen. No inguinal lymphadenopathy is seen.  No acute osseous abnormalities are identified.  Review of the MIP images confirms the above findings.  IMPRESSION: 1. No evidence of aortic dissection. No evidence of aneurysmal dilatation. The superior and inferior mesenteric arteries remain patent. 2. Soft tissue inflammation and trace fluid tracking about small bowel loops at the lower abdomen, posterior to a moderate umbilical hernia that contains a short segment of ileum. No definite evidence of obstruction, as there are no dilated small bowel loops. This may reflect some degree of dysmotility and mesenteric venous congestion due to incarceration. Small amount of fluid noted within the umbilical hernia. 3. Nonobstructing bilateral renal stones noted, measuring up to 7 mm in size on the left. These results were called by telephone at the time of interpretation on 09/05/2015 at 12:48 am to Dr. Charlesetta Ivory, who verbally acknowledged these results.   Electronically Signed   By: Garald Balding M.D.   On: 09/05/2015 00:49     Assessment and Recommendation  64 y.o. female with diabetes with complication peripheral vascular disease with episode of abdominal discomfort from incarcerated umbilical hernia having an elevated troponin possibly consistent with non-ST  elevation myocardial infarction and an echocardiogram showing apical dyskinesis possibly consistent with stress-induced cardiomyopathy and some mild congestive heart failure type symptoms 1. Possible introduction of low-dose beta blocker for cardiomyopathy  3. Continue serial ECG and enzymes to assess for possible non-ST elevation myocardial infarction 4. Begin ambulation and follow for further symptoms and further evaluation. Patient may need further intervention including cardiac catheterization to assess coronary anatomy with symptoms listed above. This  includes a possibility of right left heart catheterization. And understands risk and benefits of cardiac catheterization. This includes possibility of death stroke heart attack infection bleeding or blood clot. The patient is at low risk for conscious sedation 5. No immediate intervention at this time due to need for recovery from abdominal surgery  Signed, Serafina Royals M.D. FACC

## 2015-09-06 NOTE — Discharge Instructions (Signed)
Do not drive on pain medications Do not lift greater than 15 lbs for a period of 6 weeks Call or return to ER if you develop fever greater than 101.5, nausea/vomiting, increased pain, redness/drainage from incisions Take bandages off in 48 hours.  Okay to shower with bandages on or after they come off, no tub baths Hernia Repair Care After These instructions give you information on caring for yourself after your procedure. Your doctor may also give you more specific instructions. Call your doctor if you have any problems or questions after your procedure. HOME CARE   You may have changes in your poops (bowel movements).  You may have loose or watery poop (diarrhea).  You may be not able to poop.  Your bowels will slowly get back to normal.  Do not eat any food that makes you sick to your stomach (nauseous). Eat small meals 4 to 6 times a day instead of 3 large ones.  Do not drink pop. It will give you gas.  Do not drink alcohol.  Do not lift anything heavier than 10 pounds. This is about the weight of a gallon of milk.  Do not do anything that makes you very tired for at least 6 weeks.  Do not get your wound wet for 2 days.  You may take a sponge bath during this time.  After 2 days you may take a shower. Gently pat your surgical cut (incision) dry with a towel. Do not rub it.  For men: You may have been given an athletic supporter (scrotal support) before you left the hospital. It holds your scrotum and testicles closer to your body so there is no strain on your wound. Wear the supporter until your doctor tells you that you do not need it anymore. GET HELP RIGHT AWAY IF:  You have watery poop, or cannot poop for more than 3 days.  You feel sick to your stomach or throw up (vomit) more than 2 or 3 times.  You have temperature by mouth above 102 F (38.9 C).  You see redness or puffiness (swelling) around your wound.  You see yellowish white fluid (pus) coming from your  wound.  You see a bulge or bump in your lower belly (abdomen) or near your groin.  You develop a rash, trouble breathing, or any other symptoms from medicines taken. MAKE SURE YOU:  Understand these instructions.  Will watch your condition.  Will get help right away if your are not doing well or get worse. Document Released: 11/01/2008 Document Revised: 02/11/2012 Document Reviewed: 11/01/2008 [facility] Patient Information 2015 [place], [place]. This information is not intended to replace advice given to you by your health care provider. Make sure you discuss any questions you have with your health care provider.

## 2015-09-06 NOTE — Telephone Encounter (Signed)
Prescription printed. Please notify patient it is ready for pick up. Thanks- Dr. Amrom Ore.  

## 2015-09-06 NOTE — Discharge Summary (Signed)
Physician Discharge Summary  Patient ID: [name] [name] MRN: [id number] DOB/AGE: 1951/08/22 64 y.o.  Admit date: 09/04/2015 Discharge date: 09/06/2015  Admission Diagnoses:  Discharge Diagnoses:  Principal Problem:   Umbilical hernia with obstruction Elevated troponins   Discharged Condition: good  Hospital Course: [name] [name] was admitted and underwent emergent incarcerated umbilical hernia repair. Postop she was noted to have elevated troponins, and was seen by Dr. Nehemiah Massed of cardiology who thought this was consistent with demand ischemia.  Her diet was advanced as tolerated and she was transitioned to PO pain medications.  At time of discharge, [name] [name] was tolerating a regular diet with good PO pain control.  Consults: cardiology, internal medicine  Significant Diagnostic Studies: CT scan on presentation  Treatments: Exploratory laparotomy  Discharge Exam: Blood pressure 124/61, pulse 64, temperature 97.5 F (36.4 C), temperature source Oral, resp. rate 16, height 5\' 8"  (1.727 m), weight 329 lb (149.233 kg), SpO2 100 %. Gen: NAD/A&Ox3 ABD: soft, min tender, nondistended, incision c/d/i  Disposition:      Medication List    STOP taking these medications        folic acid 1 MG tablet  Commonly known as:  FOLVITE     oxyCODONE-acetaminophen 10-325 MG tablet  Commonly known as:  PERCOCET  Replaced by:  oxyCODONE-acetaminophen 5-325 MG tablet      TAKE these medications        ACCU-CHEK AVIVA PLUS test strip  Generic drug:  glucose blood  USE 1 STRIP DAILY     aspirin 81 MG tablet  Take by mouth.     B-D INS SYR ULTRAFINE 1CC/30G 30G X 1/2" 1 ML Misc  Generic drug:  Insulin Syringe-Needle U-100  Inject 1 Syringe into the skin See admin instructions.     cetirizine 10 MG tablet  Commonly known as:  ZYRTEC  Take 10 mg by mouth daily.     citalopram 40 MG tablet  Commonly known as:  CELEXA  Take 40 mg by mouth daily.     furosemide 40 MG tablet   Commonly known as:  LASIX  Take by mouth.     glipiZIDE 2.5 MG 24 hr tablet  Commonly known as:  GLUCOTROL XL  Take 2.5 mg by mouth daily.     hydroxychloroquine 200 MG tablet  Commonly known as:  PLAQUENIL  Take 1 tablet by mouth daily.     Krill Oil 1000 MG Caps  Take by mouth.     methotrexate 50 MG/2ML injection  Inject 0.6 mLs into the skin every 7 (seven) days.     oxyCODONE-acetaminophen 5-325 MG tablet  Commonly known as:  PERCOCET/ROXICET  Take 1-2 tablets by mouth every 4 (four) hours as needed for moderate pain.     penicillin v potassium 250 MG tablet  Commonly known as:  VEETID  Take 1 tablet by mouth 2 (two) times daily.     potassium chloride SA 20 MEQ tablet  Commonly known as:  K-DUR,KLOR-CON  Take by mouth.     REMICADE 100 MG injection  Generic drug:  inFLIXimab  Inject into the vein.     VESICARE 10 MG tablet  Generic drug:  solifenacin  Take by mouth.           Follow-up Information    Follow up with [facility]. Schedule an appointment as soon as possible for a visit in 1 week.      SignedMarlyce Huge 09/06/2015, 12:40 PM

## 2015-09-06 NOTE — Progress Notes (Signed)
[place] [facility] [place] NAME: [name] [name]    MR#:  [id number]  DATE OF BIRTH:  10/22/1951  SUBJECTIVE:  Out on the chair doing well and denies any abdominal pain. Tolerating soft diet. Denies any chest pain. Patient requesting to go home.  REVIEW OF SYSTEMS:   Review of Systems  Constitutional: Negative for fever, chills and weight loss.  HENT: Negative for ear discharge, ear pain and nosebleeds.   Eyes: Negative for blurred vision, pain and discharge.  Respiratory: Negative for sputum production, shortness of breath, wheezing and stridor.   Cardiovascular: Negative for chest pain, palpitations, orthopnea and PND.  Gastrointestinal: Negative for nausea, vomiting, abdominal pain and diarrhea.  Genitourinary: Negative for urgency and frequency.  Musculoskeletal: Negative for back pain and joint pain.  Neurological: Positive for weakness. Negative for sensory change, speech change and focal weakness.  Psychiatric/Behavioral: Negative for depression and hallucinations. The patient is not nervous/anxious.   All other systems reviewed and are negative.  Tolerating Diet: Yes  Tolerating PT: Yes  DRUG ALLERGIES:   Allergies  Allergen Reactions  . Latex     itching  . Doxycycline Rash  . Sulfa Antibiotics Itching, Rash and Swelling    VITALS:  Blood pressure 124/61, pulse 64, temperature 97.5 F (36.4 C), temperature source Oral, resp. rate 16, height 5\' 8"  (1.727 m), weight 149.233 kg (329 lb), SpO2 100 %.  PHYSICAL EXAMINATION:   Physical Exam  GENERAL:  64 y.o.-year-old patient lying in the bed with no acute distress. Morbidly obese EYES: Pupils equal, round, reactive to light and accommodation. No scleral icterus. Extraocular muscles intact.  HEENT: Head atraumatic, normocephalic. Oropharynx and nasopharynx clear.  NECK:  Supple, no jugular venous distention. No thyroid enlargement, no tenderness.  LUNGS: Decreased breath  sounds bilaterally, no wheezing, rales, rhonchi. No use of accessory muscles of respiration.  CARDIOVASCULAR: S1, S2 normal. No murmurs, rubs, or gallops.  ABDOMEN: Soft, nontender, nondistended. Bowel sounds present. No organomegaly or mass. Midline surgical scar no signs  of infection EXTREMITIES: No cyanosis, clubbing or edema b/l.    NEUROLOGIC: Cranial nerves II through XII are intact. No focal Motor or sensory deficits b/l.   PSYCHIATRIC: The patient is alert and oriented x 3.  SKIN: No obvious rash, lesion, or ulcer.    LABORATORY PANEL:   CBC  Recent Labs Lab 09/04/15 2209  WBC 11.5*  HGB 13.5  HCT 41.1  PLT 274    Chemistries   Recent Labs Lab 09/04/15 2209  NA 135  K 4.4  CL 100*  CO2 29  GLUCOSE 180*  BUN 11  CREATININE 0.66  CALCIUM 9.7  AST 33  ALT 22  ALKPHOS 56  BILITOT 0.9    Cardiac Enzymes  Recent Labs Lab 09/05/15 1150  TROPONINI 1.00*    RADIOLOGY:  Dg Chest 2 View  09/04/2015   CLINICAL DATA:  Acute onset of generalized abdominal pain. Initial encounter.  EXAM: CHEST  2 VIEW  COMPARISON:  Chest radiograph performed 10/20/2014  FINDINGS: The lungs are well-aerated and clear. There is no evidence of focal opacification, pleural effusion or pneumothorax.  The heart is normal in size; the mediastinal contour is within normal limits. No acute osseous abnormalities are seen.  IMPRESSION: No acute cardiopulmonary process seen.   Electronically Signed   By: Garald Balding M.D.   On: 09/04/2015 23:40   Ct Cta Abd/pel W/cm &/or W/o Cm  09/05/2015   CLINICAL DATA:  Acute onset of lower abdominal pain and pelvic pain, radiating to the lower back. Initial encounter.  EXAM: CTA ABDOMEN AND PELVIS wITHOUT AND WITH CONTRAST  TECHNIQUE: Multidetector CT imaging of the abdomen and pelvis was performed using the standard protocol during bolus administration of intravenous contrast. Multiplanar reconstructed images and MIPs were obtained and reviewed to evaluate  the vascular anatomy.  CONTRAST:  140mL OMNIPAQUE IOHEXOL 350 MG/ML SOLN  COMPARISON:  CT of the abdomen and pelvis performed 07/23/2011  FINDINGS: The visualized lung bases are clear.  There is no evidence of aortic dissection. There is no evidence of aneurysmal dilatation. Minimal calcification is noted along the common iliac arteries bilaterally. No luminal narrowing is seen. The superior mesenteric artery and inferior mesenteric artery remain fully patent, without evidence for ischemia.  The celiac trunk and bilateral renal arteries are also patent. The inferior vena cava is unremarkable in appearance.  Soft tissue inflammation and trace fluid are seen tracking about small bowel loops at the lower abdomen, posterior to a moderate umbilical hernia that contains a short segment of ileum. There is no definite evidence of obstruction, as no dilated small bowel loops are seen. This may simply reflect some degree of dysmotility and mesenteric congestion due to incarceration. A small amount of fluid is noted within the umbilical hernia.  The liver and spleen are unremarkable in appearance. A stone is noted dependently within the gallbladder. The gallbladder is otherwise unremarkable. The pancreas and adrenal glands are unremarkable.  A 7 mm nonobstructing stone is noted at the posterior aspect of the left kidney. A few nonobstructing right renal stones are also suggested. There is no evidence hydronephrosis. No obstructing ureteral stones are identified. No significant stranding.  No free fluid is identified. The small bowel is unremarkable in appearance. The stomach is within normal limits. No acute vascular abnormalities are seen.  The appendix is not definitely characterized; there is no evidence for appendicitis. The colon is unremarkable in appearance.  The bladder is mildly distended and grossly unremarkable. The uterus is unremarkable in appearance. The ovaries are relatively symmetric. No suspicious adnexal  masses are seen. No inguinal lymphadenopathy is seen.  No acute osseous abnormalities are identified.  Review of the MIP images confirms the above findings.  IMPRESSION: 1. No evidence of aortic dissection. No evidence of aneurysmal dilatation. The superior and inferior mesenteric arteries remain patent. 2. Soft tissue inflammation and trace fluid tracking about small bowel loops at the lower abdomen, posterior to a moderate umbilical hernia that contains a short segment of ileum. No definite evidence of obstruction, as there are no dilated small bowel loops. This may reflect some degree of dysmotility and mesenteric venous congestion due to incarceration. Small amount of fluid noted within the umbilical hernia. 3. Nonobstructing bilateral renal stones noted, measuring up to 7 mm in size on the left. These results were called by telephone at the time of interpretation on 09/05/2015 at 12:48 am to Dr. Charlesetta Ivory, who verbally acknowledged these results.   Electronically Signed   By: Garald Balding M.D.   On: 09/05/2015 00:49     ASSESSMENT AND PLAN:  64 y.o. female with a known history of coronary artery disease, rheumatoid arthritis, type 2 diabetes. She presented urgently to the ER with abdominal pain and nausea and dry heaving and she was found to have an incarcerated hernia  1. Elevated troponin trending upwards. History of coronary artery disease. Last troponin 1.0. Patient does not have any chest pain or shortness  of breath at this point. This appears demand ischemia from incarcerated hernia and surgery. The patient also had accelerated hypertension on presentation. Now blood pressures normalized. Patient is currently on aspirin. Patient has EF of 25%. She has cardiomyopathy could be stress-induced. Dr. Nehemiah Massed recommends Coreg 3.125 twice a day. He will do further workup as outpatient. Patient currently is asymptomatic.  2. Type 2 diabetes mellitus-resume   3. Rheumatoid arthritis- continue  plaquenil, patient also takes methotrexate injections. Patient states that she does not take Remicade anymore.  4. Chronic low grade cellulitis on suppressive antibiotics. Patiently currently on Zosyn for the hernia.  5. Morbid obesity weight loss needed.  6. History of fibromyalgia  7. Anxiety depression on Celexa.  Case discussed with Dr. Loistine Simas and Dr. colonoscopy. Patient is okay medically to be discharged to home.  Case discussed with Care Management/Social Worker. Management plans discussed with the patient, family and they are in agreement.  CODE STATUS: Full  DVT Prophylaxis: Lovenox  TOTAL TIME TAKING CARE OF THIS PATIENT 35 minutes.  >50% time spent on counselling and coordination of care patient, cardiology, surgery   Arminta Gamm M.D on 09/06/2015 at 12:58 PM  Between 7am to 6pm - Pager - [phone]  After 6pm go to [email or web address] - password [email or web address] [facility]  [facility] [facility]  Office  [phone]  CC: Primary care physician; Margarita Rana, MD

## 2015-09-06 NOTE — Telephone Encounter (Signed)
Refill needed for oxyCODONE-acetaminophen (PERCOCET/ROXICET) 5-325 MG tablet  Husband picking up   Call back [phone]  Thanks [name]

## 2015-09-06 NOTE — Progress Notes (Signed)
Surgery Progress Note  S: Doing better.  Pain controlled.  Tolerating liquids.  Hungry. O:Blood pressure 124/61, pulse 64, temperature 97.5 F (36.4 C), temperature source Oral, resp. rate 16, height 5\' 8"  (1.727 m), weight 329 lb (149.233 kg), SpO2 100 %. GEN: NAD/A&Ox3 ABD: soft, min tender, nondistended, incision c/d/i

## 2015-09-06 NOTE — Evaluation (Signed)
Physical Therapy Evaluation Patient Details Name: [name] [name] MRN: [id number] DOB: 1951/03/29 Today's Date: 09/06/2015   History of Present Illness  Pt was admitted to the hospital with abdominal pain which was revealed to be a hernia, and surgically repaired here at the hospital.   Clinical Impression  Pt presents with hx of arthritis, fibromyalgia, DM, cellulitis, and CAD. Examination reveals that pt performs transfers at [facility], and ambulation of 120 total ft at [facility] (reference ambulation for vitals). Pt's primary physical deficits include decreased activity tolerance, removing her from her baseline of mobility. She shows fair functional strength with transfers and does not appear unbalanced or wobbly in any way whether using a BRW or not with ambulation and transfers. Due to her decreased activity tolerance, she will continue to benefit from skilled PT in order for her to return home safely. Pt has good support system in husband and sister.    Follow Up Recommendations Home health PT;Supervision/Assistance - 24 hour    Equipment Recommendations  None recommended by PT    Recommendations for Other Services       Precautions / Restrictions Restrictions Weight Bearing Restrictions: No      Mobility  Bed Mobility Overal bed mobility:  (Pt in recliner; not assessed )                Transfers Overall transfer level: Needs assistance Equipment used:  (BRW) Transfers: Sit to/from Stand Sit to Stand: Min guard (CGA for safety in hospital)         General transfer comment: Pt transfers with good strength and stability. Needs cues on hand placement with RW, but no LOB or unsteadiness with transfer   Ambulation/Gait Ambulation/Gait assistance: Min guard Ambulation Distance (Feet): 120 Feet (2 x 60 ft) Assistive device: Rolling walker (2 wheeled);None (2 sets of ambulation for gait assessment ) Gait Pattern/deviations: Step-through pattern Gait velocity: 1.03 ft/s Gait  velocity interpretation: <1.8 ft/sec, indicative of risk for recurrent falls General Gait Details: Pt ambulates generally WFL, but states she feels slightly more stable with RW. No significant gait deviations noticed affecting the safety of her ambulation. Prior to ambulation, pt's O2 sat on room air was 95% and her HR was 74. Following 60 ft ambulation on room air her O2 sat was 90% and her HR was 105 bpm. Both of these returned to baseline within 30 seconds of sitting rest. Pt with no c/o chest pain or significant SOB. Pt was left off of her O2 [place] secondary to good resting O2 and exercise response. Nurse notified of this.   Stairs            Wheelchair Mobility    Modified Rankin (Stroke Patients Only)       Balance Overall balance assessment: No apparent balance deficits (not formally assessed)                                           Pertinent Vitals/Pain Pain Assessment: No/denies pain    Home Living Family/patient expects to be discharged to:: Private residence Living Arrangements: Spouse/significant other (Sister will help 24 hrs/day as well as husband) Available Help at Discharge: Available 24 hours/day;Family Type of Home: House Home Access: Stairs to enter Entrance Stairs-Rails: Can reach both Entrance Stairs-Number of Steps: 3 Home Layout: One level Home Equipment: Cane - single point (lift chair, BRW)      Prior  Function Level of Independence: Independent with assistive device(s)         Comments: Pt was able to ambulate with BRW and without BRW. Occasionally would use a SPC     Hand Dominance        Extremity/Trunk Assessment   Upper Extremity Assessment: Overall WFL for tasks assessed           Lower Extremity Assessment: Overall WFL for tasks assessed         Communication   Communication: No difficulties  Cognition Arousal/Alertness: Awake/alert Behavior During Therapy: WFL for tasks assessed/performed Overall  Cognitive Status: Within Functional Limits for tasks assessed                      General Comments      Exercises Other Exercises Other Exercises: Pt performed additonal gait training for assessment of need for RW. Pt appears stable with both options, however she was encouraged to use BRW if she feels unstable with walking at home.   (x10 min)      Assessment/Plan    PT Assessment Patient needs continued PT services  PT Diagnosis  (Decreased activity tolerance)   PT Problem List Decreased activity tolerance;Decreased mobility;Decreased knowledge of use of DME;Cardiopulmonary status limiting activity;Obesity  PT Treatment Interventions DME instruction;Gait training;Stair training;Functional mobility training;Therapeutic activities;Therapeutic exercise;Neuromuscular re-education;Balance training;Patient/family education;Wheelchair mobility training;Manual techniques   PT Goals (Current goals can be found in the Care Plan section) Acute Rehab PT Goals Patient Stated Goal: to return home PT Goal Formulation: With patient Time For Goal Achievement: 09/20/15 Potential to Achieve Goals: Good    Frequency Min 2X/week   Barriers to discharge        Co-evaluation               End of Session Equipment Utilized During Treatment: Gait belt Activity Tolerance: Patient tolerated treatment well Patient left: in chair;with call bell/phone within reach;with family/visitor present Nurse Communication: Mobility status (O2 and HR response to mobility )         Time: 2751-7001 PT Time Calculation (min) (ACUTE ONLY): 24 min   Charges:         PT G CodesJanyth Contes September 21, 2015, 3:31 PM  Janyth Contes, SPT. [phone]

## 2015-09-08 ENCOUNTER — Other Ambulatory Visit: Payer: Self-pay | Admitting: Family Medicine

## 2015-09-08 DIAGNOSIS — F4322 Adjustment disorder with anxiety: Secondary | ICD-10-CM

## 2015-09-16 ENCOUNTER — Other Ambulatory Visit: Payer: Self-pay | Admitting: Family Medicine

## 2015-09-16 ENCOUNTER — Encounter: Payer: Self-pay | Admitting: Surgery

## 2015-09-16 DIAGNOSIS — E119 Type 2 diabetes mellitus without complications: Secondary | ICD-10-CM

## 2015-09-20 DIAGNOSIS — I252 Old myocardial infarction: Secondary | ICD-10-CM | POA: Insufficient documentation

## 2015-09-20 DIAGNOSIS — I5021 Acute systolic (congestive) heart failure: Secondary | ICD-10-CM | POA: Insufficient documentation

## 2015-09-21 ENCOUNTER — Ambulatory Visit (INDEPENDENT_AMBULATORY_CARE_PROVIDER_SITE_OTHER): Payer: Medicaid Other | Admitting: Surgery

## 2015-09-21 ENCOUNTER — Encounter: Payer: Self-pay | Admitting: Surgery

## 2015-09-21 VITALS — BP 135/71 | HR 77 | Temp 98.1°F | Ht 68.5 in | Wt 339.0 lb

## 2015-09-21 DIAGNOSIS — K436 Other and unspecified ventral hernia with obstruction, without gangrene: Secondary | ICD-10-CM

## 2015-09-21 NOTE — Patient Instructions (Signed)
Please give us a call if you have any questions or concerns. 

## 2015-09-21 NOTE — Progress Notes (Signed)
Surgery Clinic  64 y/o female s/p repair incarcerated ventral hernia about 2 weeks ago.  Here for staple removal and follow-up  She is doing well  PE    Filed Vitals:   09/21/15 1321  BP: 135/71  Pulse: 77  Temp: 98.1 F (36.7 C)    Abd soft, staples removed. Inc c/d/i  IMP  Doing well, high risk of recurrence as no mesh used.  I discussed with her again need for weight lose.  If recurs will need mesh to repair and she understands.  Plan:  Activity as tolerated, f/u as needed.

## 2015-10-04 ENCOUNTER — Other Ambulatory Visit: Payer: Self-pay | Admitting: Family Medicine

## 2015-10-04 DIAGNOSIS — M1990 Unspecified osteoarthritis, unspecified site: Secondary | ICD-10-CM

## 2015-10-04 MED ORDER — OXYCODONE-ACETAMINOPHEN 10-325 MG PO TABS: 1.0000 | ORAL_TABLET | Freq: Four times a day (QID) | ORAL | Status: DC | PRN

## 2015-10-04 NOTE — Telephone Encounter (Signed)
Prescription printed. Please notify patient it is ready for pick up. Thanks- Dr. Jameel Quant.  

## 2015-10-04 NOTE — Telephone Encounter (Signed)
Pt contacted office for refill request on the following medications:  oxyCODONE-acetaminophen (PERCOCET) 10-325 MG tablet.  PT#[phone]/HD

## 2015-10-05 NOTE — Telephone Encounter (Signed)
LMTCB. sd  

## 2015-10-06 DIAGNOSIS — I5022 Chronic systolic (congestive) heart failure: Secondary | ICD-10-CM | POA: Insufficient documentation

## 2015-11-02 ENCOUNTER — Other Ambulatory Visit: Payer: Self-pay | Admitting: Family Medicine

## 2015-11-02 DIAGNOSIS — M1990 Unspecified osteoarthritis, unspecified site: Secondary | ICD-10-CM

## 2015-11-02 NOTE — Telephone Encounter (Signed)
Pt contacted office for refill request on the following medications:  oxyCODONE-acetaminophen (PERCOCET) 10-325 MG tablet.  CB#[phone]/MW ° °

## 2015-11-02 NOTE — Telephone Encounter (Signed)
This has to go to another provider. Thanks.

## 2015-11-03 MED ORDER — OXYCODONE-ACETAMINOPHEN 10-325 MG PO TABS: 1.0000 | ORAL_TABLET | Freq: Four times a day (QID) | ORAL | Status: DC | PRN

## 2015-12-01 ENCOUNTER — Other Ambulatory Visit: Payer: Self-pay | Admitting: Family Medicine

## 2015-12-01 DIAGNOSIS — M1990 Unspecified osteoarthritis, unspecified site: Secondary | ICD-10-CM

## 2015-12-01 MED ORDER — OXYCODONE-ACETAMINOPHEN 10-325 MG PO TABS: 1.0000 | ORAL_TABLET | Freq: Four times a day (QID) | ORAL | Status: DC | PRN

## 2015-12-01 NOTE — Telephone Encounter (Signed)
Prescription printed. Please notify patient it is ready for pick up. Thanks- Dr. Gizelle Whetsel.  

## 2015-12-01 NOTE — Telephone Encounter (Signed)
Pt needs refill on oxyCODONE-acetaminophen (PERCOCET) 10-325 MG tablet   Please call when ready. [phone]  Thanks [name]

## 2015-12-01 NOTE — Telephone Encounter (Signed)
Last refill 11/03/2015

## 2015-12-21 ENCOUNTER — Other Ambulatory Visit: Payer: Self-pay | Admitting: Family Medicine

## 2015-12-21 DIAGNOSIS — E119 Type 2 diabetes mellitus without complications: Secondary | ICD-10-CM

## 2015-12-28 ENCOUNTER — Other Ambulatory Visit: Payer: Self-pay

## 2015-12-28 DIAGNOSIS — M1990 Unspecified osteoarthritis, unspecified site: Secondary | ICD-10-CM

## 2015-12-28 NOTE — Telephone Encounter (Signed)
Patient is requesting a refill on Oxycodone. CB#[phone]

## 2015-12-29 MED ORDER — OXYCODONE-ACETAMINOPHEN 10-325 MG PO TABS: 1.0000 | ORAL_TABLET | Freq: Four times a day (QID) | ORAL | Status: DC | PRN

## 2016-01-05 LAB — HM DIABETES EYE EXAM

## 2016-01-25 ENCOUNTER — Other Ambulatory Visit: Payer: Self-pay | Admitting: Family Medicine

## 2016-01-25 DIAGNOSIS — M1990 Unspecified osteoarthritis, unspecified site: Secondary | ICD-10-CM

## 2016-01-25 MED ORDER — OXYCODONE-ACETAMINOPHEN 10-325 MG PO TABS: 1.0000 | ORAL_TABLET | Freq: Four times a day (QID) | ORAL | Status: DC | PRN

## 2016-01-25 NOTE — Telephone Encounter (Signed)
Needs ov before another refill. Thanks.

## 2016-01-25 NOTE — Telephone Encounter (Signed)
Patient advised as directed below. Patient scheduled a follow up appointment for 01/31/2016.

## 2016-01-25 NOTE — Telephone Encounter (Signed)
Pt contacted office for refill request on the following medications: °oxyCODONE-acetaminophen (PERCOCET) 10-325 MG tablet. Thanks TNP °

## 2016-01-25 NOTE — Telephone Encounter (Signed)
Prescription printed. Please notify patient it is ready for pick up. Thanks- Dr. Crystale Giannattasio.  

## 2016-01-31 ENCOUNTER — Ambulatory Visit (INDEPENDENT_AMBULATORY_CARE_PROVIDER_SITE_OTHER): Payer: Medicaid Other | Admitting: Family Medicine

## 2016-01-31 ENCOUNTER — Encounter: Payer: Self-pay | Admitting: Family Medicine

## 2016-01-31 VITALS — BP 146/82 | HR 68 | Temp 98.7°F | Resp 16 | Wt 345.0 lb

## 2016-01-31 DIAGNOSIS — I10 Essential (primary) hypertension: Secondary | ICD-10-CM

## 2016-01-31 DIAGNOSIS — J309 Allergic rhinitis, unspecified: Secondary | ICD-10-CM | POA: Diagnosis not present

## 2016-01-31 DIAGNOSIS — E7800 Pure hypercholesterolemia, unspecified: Secondary | ICD-10-CM

## 2016-01-31 DIAGNOSIS — F329 Major depressive disorder, single episode, unspecified: Secondary | ICD-10-CM

## 2016-01-31 DIAGNOSIS — F32A Depression, unspecified: Secondary | ICD-10-CM

## 2016-01-31 DIAGNOSIS — E119 Type 2 diabetes mellitus without complications: Secondary | ICD-10-CM | POA: Diagnosis not present

## 2016-01-31 LAB — POCT GLYCOSYLATED HEMOGLOBIN (HGB A1C): Hemoglobin A1C: 6.5

## 2016-01-31 MED ORDER — CETIRIZINE HCL 10 MG PO TABS
10.0000 mg | ORAL_TABLET | Freq: Every day | ORAL | Status: DC
Start: 2016-01-31 — End: 2016-12-14

## 2016-01-31 NOTE — Progress Notes (Signed)
Patient: [name] [name] Female    DOB: Jan 17, 1951   65 y.o.   MRN: [id number] Visit Date: 01/31/2016  Today's Provider: Margarita Rana, MD   Chief Complaint  Patient presents with  . Diabetes  . Hypertension  . Hyperlipidemia  . Depression   Subjective:    Depression        This is a chronic problem.  The problem has been gradually worsening since onset.  Associated symptoms include fatigue, insomnia and decreased interest.  Associated symptoms include no decreased concentration, no helplessness, no hopelessness and no appetite change.     Diabetes Mellitus Type II, Follow-up:   No results found for: HGBA1C Last seen for diabetes 1 years ago.  Management since then includes None. She reports excellent compliance with treatment. She is having side effects. Pt reports her blood sugar crashes if she doesn't eat before None Current symptoms include polydipsia and polyuria and have been stable. Home blood sugar records: Low 100's  Episodes of hypoglycemia? yes - Generally happens when she doesn't eat before None.   Most Recent Eye Exam: 12/2015 Weight trend: stable Current diet: in general, a "healthy" diet    Current exercise: none  ------------------------------------------------------------------------   Hypertension, follow-up:  BP Readings from Last 3 Encounters:  01/31/16 146/82  09/21/15 135/71  09/06/15 124/61    She was last seen for hypertension 1 years ago.  Management since that visit includes None .She reports excellent compliance with treatment. She is not having side effects.  She is not exercising. Pt reports she is unable to exercise secondary to knee pain. Outside blood pressures are running normal per pt. She is experiencing none.  Patient denies none.   Cardiovascular risk factors include diabetes mellitus, dyslipidemia, hypertension and obesity (BMI >= 30 kg/m2).    ------------------------------------------------------------------------    Lipid/Cholesterol, Follow-up:   Last seen for this 1 years ago.  Management since that visit includes None.  Last Lipid Panel: No results found for: CHOL, TRIG, HDL, CHOLHDL, VLDL, LDLCALC, LDLDIRECT   Wt Readings from Last 3 Encounters:  01/31/16 345 lb (156.491 kg)  09/21/15 339 lb (153.769 kg)  09/04/15 329 lb (149.233 kg)    ------------------------------------------------------------------------       Allergies  Allergen Reactions  . Latex     itching  . Doxycycline Rash  . Sulfa Antibiotics Itching, Rash and Swelling   Previous Medications   ASPIRIN 81 MG TABLET    Take by mouth.   B-D INS SYR ULTRAFINE 1CC/30G 30G X 1/2" 1 ML MISC    See admin instructions.   CARVEDILOL (COREG) 3.125 MG TABLET    Take 1 tablet (3.125 mg total) by mouth 2 (two) times daily with a meal.   CITALOPRAM (CELEXA) 40 MG TABLET    TAKE 1 TABLET BY MOUTH DAILY   FOLIC ACID (FOLVITE) Q000111Q MCG TABLET    Take 400 mcg by mouth daily.   GLIPIZIDE (GLUCOTROL XL) 2.5 MG 24 HR TABLET    TAKE 1 TABLET BY MOUTH DAILY   GLUCOSE BLOOD (ACCU-CHEK AVIVA PLUS) TEST STRIP    To check blood sugar once a day.  DX: E11.9   HYDROXYCHLOROQUINE (PLAQUENIL) 200 MG TABLET    Take 1 tablet by mouth daily.   KRILL OIL 1000 MG CAPS    Take by mouth.   METHOTREXATE 50 MG/2ML INJECTION    Inject 0.6 mLs into the skin every 7 (seven) days.   OXYCODONE-ACETAMINOPHEN (PERCOCET) 10-325 MG  TABLET    Take 1 tablet by mouth every 6 (six) hours as needed.   PENICILLIN V POTASSIUM (VEETID) 250 MG TABLET    Take 1 tablet by mouth 2 (two) times daily.    Review of Systems  Constitutional: Positive for fatigue. Negative for appetite change.  Respiratory: Negative.   Cardiovascular: Negative.   Gastrointestinal: Negative.   Endocrine: Negative for cold intolerance and heat intolerance.  Psychiatric/Behavioral: Positive for depression, sleep  disturbance and dysphoric mood. Negative for hallucinations, behavioral problems, confusion, self-injury, decreased concentration and agitation. The patient is nervous/anxious and has insomnia. The patient is not hyperactive.     Social History  Substance Use Topics  . Smoking status: Former Smoker    Quit date: 12/03/1996  . Smokeless tobacco: Never Used  . Alcohol Use: No   Objective:   BP 146/82 mmHg  Pulse 68  Temp(Src) 98.7 F (37.1 C) (Oral)  Resp 16  Wt 345 lb (156.491 kg)  Physical Exam  Constitutional: She is oriented to person, place, and time. She appears well-developed and well-nourished.  Cardiovascular: Normal rate, regular rhythm and normal heart sounds.   Pulmonary/Chest: Effort normal and breath sounds normal.  Neurological: She is alert and oriented to person, place, and time.  Psychiatric: She has a normal mood and affect. Her behavior is normal. Judgment and thought content normal.      Assessment & Plan:     1. Essential hypertension Condition is stable. Please continue current medication and  plan of care as noted.    2. Type 2 diabetes mellitus without complication, without long-term current use of insulin (HCC) Stable. Continue current medication and plan of care.   Recheck in 3 to 6 months.  - POCT glycosylated hemoglobin (Hb A1C) Results for orders placed or performed in visit on 01/31/16  POCT glycosylated hemoglobin (Hb A1C)  Result Value Ref Range   Hemoglobin A1C 6.5     3. Hypercholesteremia Needs recheck .   4. Allergic rhinitis, unspecified allergic rhinitis type Condition is stable. Please continue current medication and  plan of care as noted.   - cetirizine (ZYRTEC) 10 MG tablet; Take 1 tablet (10 mg total) by mouth daily.  Dispense: 90 tablet; Refill: 1  5. Clinical depression Not quite at  Goal.  Does not want to start new medication quite yet. Thinks related to her previous surgery and not getting out much this winter. Wants to try  getting out more first. May consider Wellbutrin if does not improve. Patient will call.     Patient was seen and examined by Jerrell Belfast, MD, and note scribed by Ashley Royalty, CMA.  I have reviewed the document for accuracy and completeness and I agree with above. - Jerrell Belfast, MD   Margarita Rana, MD  [facility]

## 2016-02-22 ENCOUNTER — Other Ambulatory Visit: Payer: Self-pay | Admitting: Family Medicine

## 2016-02-22 DIAGNOSIS — M1990 Unspecified osteoarthritis, unspecified site: Secondary | ICD-10-CM

## 2016-02-22 MED ORDER — OXYCODONE-ACETAMINOPHEN 10-325 MG PO TABS: 1.0000 | ORAL_TABLET | Freq: Four times a day (QID) | ORAL | Status: DC | PRN

## 2016-02-22 NOTE — Telephone Encounter (Signed)
Pt contacted office for refill request on the following medications: oxyCODONE-acetaminophen (PERCOCET) 10-325 MG tablet. Last written on 01/25/16. Thanks TNP

## 2016-02-22 NOTE — Telephone Encounter (Signed)
Prescription printed. Please notify patient it is ready for pick up. Thanks- Dr. Kamir Selover.  

## 2016-02-28 ENCOUNTER — Other Ambulatory Visit: Payer: Self-pay | Admitting: Family Medicine

## 2016-02-28 DIAGNOSIS — F32A Depression, unspecified: Secondary | ICD-10-CM

## 2016-02-28 DIAGNOSIS — F329 Major depressive disorder, single episode, unspecified: Secondary | ICD-10-CM

## 2016-02-28 NOTE — Telephone Encounter (Signed)
LOV 01/31/2016. Has FU scheduled with Tawanna Sat in August. Renaldo Fiddler, CMA

## 2016-03-21 ENCOUNTER — Other Ambulatory Visit: Payer: Self-pay | Admitting: Family Medicine

## 2016-03-21 DIAGNOSIS — M1990 Unspecified osteoarthritis, unspecified site: Secondary | ICD-10-CM

## 2016-03-21 MED ORDER — OXYCODONE-ACETAMINOPHEN 10-325 MG PO TABS: 1.0000 | ORAL_TABLET | Freq: Four times a day (QID) | ORAL | Status: DC | PRN

## 2016-03-21 NOTE — Telephone Encounter (Signed)
Prescription printed. Please notify patient it is ready for pick up. Thanks- Dr. Auden Wettstein.  

## 2016-03-21 NOTE — Telephone Encounter (Signed)
Pt needs refill on her oxyCODONE-acetaminophen (PERCOCET) 10-325 MG tablet   Please call when ready.  ThanksTeri

## 2016-03-23 ENCOUNTER — Telehealth: Payer: Self-pay

## 2016-03-23 DIAGNOSIS — M1990 Unspecified osteoarthritis, unspecified site: Secondary | ICD-10-CM

## 2016-03-23 MED ORDER — OXYCODONE-ACETAMINOPHEN 10-325 MG PO TABS: 1.0000 | ORAL_TABLET | Freq: Four times a day (QID) | ORAL | Status: DC | PRN

## 2016-03-23 NOTE — Telephone Encounter (Signed)
prescription printed.

## 2016-04-02 ENCOUNTER — Other Ambulatory Visit: Payer: Self-pay | Admitting: Family Medicine

## 2016-04-02 DIAGNOSIS — E119 Type 2 diabetes mellitus without complications: Secondary | ICD-10-CM

## 2016-04-18 ENCOUNTER — Other Ambulatory Visit: Payer: Self-pay | Admitting: Family Medicine

## 2016-04-18 DIAGNOSIS — M1990 Unspecified osteoarthritis, unspecified site: Secondary | ICD-10-CM

## 2016-04-18 MED ORDER — OXYCODONE-ACETAMINOPHEN 10-325 MG PO TABS: 1.0000 | ORAL_TABLET | Freq: Four times a day (QID) | ORAL | Status: DC | PRN

## 2016-04-18 NOTE — Telephone Encounter (Signed)
Last OV: 01/31/2016  Last Refill: 03/23/2016

## 2016-04-18 NOTE — Telephone Encounter (Signed)
Prescription printed. Please notify patient it is ready for pick up. Thanks- Dr. Alaila Pillard.  

## 2016-04-18 NOTE — Telephone Encounter (Signed)
Pt advised.   Thanks,   -Laura  

## 2016-04-18 NOTE — Telephone Encounter (Signed)
Pt contacted office for refill request on the following medications:  oxyCODONE-acetaminophen (PERCOCET) 10-325 MG tablet.  [id number]

## 2016-05-17 ENCOUNTER — Other Ambulatory Visit: Payer: Self-pay | Admitting: Family Medicine

## 2016-05-17 DIAGNOSIS — M1990 Unspecified osteoarthritis, unspecified site: Secondary | ICD-10-CM

## 2016-05-17 MED ORDER — OXYCODONE-ACETAMINOPHEN 10-325 MG PO TABS: 1.0000 | ORAL_TABLET | Freq: Four times a day (QID) | ORAL | Status: DC | PRN

## 2016-05-17 NOTE — Telephone Encounter (Signed)
Prescription printed. Please notify patient it is ready for pick up. Thanks- Dr. Jasa Dundon.  

## 2016-05-17 NOTE — Telephone Encounter (Signed)
Pt needs refill on her oxyCODONE-acetaminophen (PERCOCET) 10-325 MG tablet 04/18/16 -- Margarita Rana, MD Take 1 tablet by mouth every 6 (six) hours as needed.  Please call patient when ready to pick up [phone]  [facility]

## 2016-06-13 ENCOUNTER — Other Ambulatory Visit: Payer: Self-pay | Admitting: Family Medicine

## 2016-06-13 DIAGNOSIS — M1990 Unspecified osteoarthritis, unspecified site: Secondary | ICD-10-CM

## 2016-06-13 MED ORDER — OXYCODONE-ACETAMINOPHEN 10-325 MG PO TABS: 1.0000 | ORAL_TABLET | Freq: Four times a day (QID) | ORAL | Status: DC | PRN

## 2016-06-13 NOTE — Telephone Encounter (Signed)
Pt contacted office for refill request on the following medications:  oxyCODONE-acetaminophen (PERCOCET) 10-325 MG tablet.  [id number]  This is a pt of Dr Venia Minks

## 2016-06-13 NOTE — Telephone Encounter (Signed)
Patient advised prescription up front ready for pick up.  Thanks,  -Faustina Gebert

## 2016-07-11 ENCOUNTER — Other Ambulatory Visit: Payer: Self-pay | Admitting: Family Medicine

## 2016-07-11 DIAGNOSIS — M1990 Unspecified osteoarthritis, unspecified site: Secondary | ICD-10-CM

## 2016-07-11 MED ORDER — OXYCODONE-ACETAMINOPHEN 10-325 MG PO TABS: 1.0000 | ORAL_TABLET | Freq: Four times a day (QID) | ORAL | 0 refills | Status: DC | PRN

## 2016-07-11 NOTE — Telephone Encounter (Signed)
Please Review. She is a Holiday representative patient.

## 2016-07-11 NOTE — Telephone Encounter (Signed)
Pt needs her oxycodone 10-325  Please advise when ready for pick up.  Pt call back is [phone]  Thank sTeri

## 2016-07-25 ENCOUNTER — Encounter: Payer: Self-pay | Admitting: Physician Assistant

## 2016-07-25 ENCOUNTER — Ambulatory Visit: Payer: Medicaid Other | Admitting: Physician Assistant

## 2016-07-25 NOTE — Progress Notes (Deleted)
Patient: [name] [name] Female    DOB: 1951/11/02   65 y.o.   MRN: [id number] Visit Date: 07/25/2016  Today's Provider: Mar Daring, PA-C   No chief complaint on file.  Subjective:    HPI  Diabetes Mellitus Type II, Follow-up:   Lab Results  Component Value Date   HGBA1C 6.5 01/31/2016   Last seen for diabetes 6 months ago.  Management since then includes continue medications. She reports good compliance with treatment. She is not having side effects.  Current symptoms include none and have been stable. Home blood sugar records: {diabetes glucometry results:16657}  Episodes of hypoglycemia? {yes***/no:17258}   Current Insulin Regimen:none Weight trend: {trend:16658} Current diet: {diet habits:16563} Current exercise: {exercise types:16438}  ------------------------------------------------------------------------   Hypertension, follow-up:  BP Readings from Last 3 Encounters:  01/31/16 (!) 146/82  09/21/15 135/71  09/06/15 124/61    She was last seen for hypertension 6 months ago.  BP at that visit was 146/82. Management since that visit includes continue medication.She reports good compliance with treatment. She is not having side effects.  She {is/is not:9024} exercising. She {is/is not:9024} adherent to low salt diet.   Outside blood pressures are ***. She is experiencing none.  Patient denies none.   Cardiovascular risk factors include diabetes mellitus, dyslipidemia, hypertension and obesity (BMI >= 30 kg/m2).  Use of agents associated with hypertension: none.   ------------------------------------------------------------------------    Lipid/Cholesterol, Follow-up:   Last seen for this 6 months ago.  Management since that visit includes continue medication.  Last Lipid Panel: No results found for: CHOL, TRIG, HDL, CHOLHDL, VLDL, LDLCALC, LDLDIRECT  She reports good compliance with treatment. She is not having side effects.   Wt  Readings from Last 3 Encounters:  01/31/16 (!) 345 lb (156.5 kg)  09/21/15 (!) 339 lb (153.8 kg)  09/04/15 (!) 329 lb (149.2 kg)    ------------------------------------------------------------------------    Previous Medications   ASPIRIN 81 MG TABLET    Take by mouth.   B-D INS SYR ULTRAFINE 1CC/30G 30G X 1/2" 1 ML MISC    See admin instructions.   CARVEDILOL (COREG) 3.125 MG TABLET    Take 1 tablet (3.125 mg total) by mouth 2 (two) times daily with a meal.   CETIRIZINE (ZYRTEC) 10 MG TABLET    Take 1 tablet (10 mg total) by mouth daily.   CITALOPRAM (CELEXA) 40 MG TABLET    TAKE 1 TABLET BY MOUTH DAILY   FOLIC ACID (FOLVITE) Q000111Q MCG TABLET    Take 400 mcg by mouth daily.   GLIPIZIDE XL 2.5 MG 24 HR TABLET    TAKE 1 TABLET BY MOUTH DAILY   GLUCOSE BLOOD (ACCU-CHEK AVIVA PLUS) TEST STRIP    To check blood sugar once a day.  DX: E11.9   HYDROXYCHLOROQUINE (PLAQUENIL) 200 MG TABLET    Take 1 tablet by mouth daily.   KRILL OIL 1000 MG CAPS    Take by mouth.   METHOTREXATE 50 MG/2ML INJECTION    Inject 0.6 mLs into the skin every 7 (seven) days.   OXYCODONE-ACETAMINOPHEN (PERCOCET) 10-325 MG TABLET    Take 1 tablet by mouth every 6 (six) hours as needed.   PENICILLIN V POTASSIUM (VEETID) 250 MG TABLET    Take 1 tablet by mouth 2 (two) times daily.    Review of Systems  Constitutional: Negative.   Respiratory: Negative.   Cardiovascular: Negative.     Social History  Substance Use Topics  . Smoking status: Former  Smoker    Quit date: 12/03/1996  . Smokeless tobacco: Never Used  . Alcohol use No   Objective:   There were no vitals taken for this visit.  Physical Exam      Assessment & Plan:       Follow up: No Follow-up on file.

## 2016-08-09 ENCOUNTER — Other Ambulatory Visit: Payer: Self-pay | Admitting: Physician Assistant

## 2016-08-09 DIAGNOSIS — M1990 Unspecified osteoarthritis, unspecified site: Secondary | ICD-10-CM

## 2016-08-09 MED ORDER — OXYCODONE-ACETAMINOPHEN 10-325 MG PO TABS: 1.0000 | ORAL_TABLET | Freq: Four times a day (QID) | ORAL | 0 refills | Status: DC | PRN

## 2016-08-09 NOTE — Telephone Encounter (Signed)
Pt contacted office for refill request on the following medications: oxyCODONE-acetaminophen (PERCOCET) 10-325 MG tablet Last written: 07/11/16 Last OV: 01/31/16 with Dr. Venia Minks Pt had a no show with Tawanna Sat on 07/25/16. I didn't noticed until pt had already hung up. Please advise. Thanks TNP

## 2016-09-04 ENCOUNTER — Other Ambulatory Visit: Payer: Self-pay | Admitting: Physician Assistant

## 2016-09-04 DIAGNOSIS — M1990 Unspecified osteoarthritis, unspecified site: Secondary | ICD-10-CM

## 2016-09-04 MED ORDER — OXYCODONE-ACETAMINOPHEN 10-325 MG PO TABS: 1.0000 | ORAL_TABLET | Freq: Four times a day (QID) | ORAL | 0 refills | Status: DC | PRN

## 2016-09-04 NOTE — Telephone Encounter (Signed)
Please notify patient Rx printed.

## 2016-09-04 NOTE — Telephone Encounter (Signed)
Pt contacted office for refill request on the following medications:  oxyCODONE-acetaminophen (PERCOCET) 10-325 MG tablet.  [id number]

## 2016-09-06 ENCOUNTER — Other Ambulatory Visit: Payer: Self-pay | Admitting: Family Medicine

## 2016-09-06 DIAGNOSIS — F329 Major depressive disorder, single episode, unspecified: Secondary | ICD-10-CM

## 2016-09-06 DIAGNOSIS — F32A Depression, unspecified: Secondary | ICD-10-CM

## 2016-10-01 ENCOUNTER — Other Ambulatory Visit: Payer: Self-pay | Admitting: Physician Assistant

## 2016-10-01 DIAGNOSIS — M1990 Unspecified osteoarthritis, unspecified site: Secondary | ICD-10-CM

## 2016-10-01 NOTE — Telephone Encounter (Signed)
Pt contacted office for refill request on the following medications:  oxyCODONE-acetaminophen (PERCOCET) 10-325 MG tablet.  [id number]

## 2016-10-02 MED ORDER — OXYCODONE-ACETAMINOPHEN 10-325 MG PO TABS: 1.0000 | ORAL_TABLET | Freq: Four times a day (QID) | ORAL | 0 refills | Status: DC | PRN

## 2016-10-02 NOTE — Telephone Encounter (Signed)
Patient advised Rx ready for pick up.  Thanks,  -Joseline

## 2016-10-02 NOTE — Telephone Encounter (Signed)
Please notify patient Rx printed.

## 2016-10-29 ENCOUNTER — Telehealth: Payer: Self-pay | Admitting: Physician Assistant

## 2016-10-29 DIAGNOSIS — M1990 Unspecified osteoarthritis, unspecified site: Secondary | ICD-10-CM

## 2016-10-29 MED ORDER — OXYCODONE-ACETAMINOPHEN 10-325 MG PO TABS: 1.0000 | ORAL_TABLET | Freq: Four times a day (QID) | ORAL | 0 refills | Status: DC | PRN

## 2016-10-29 NOTE — Telephone Encounter (Signed)
Pt needs refill ° °oxyCODONE-acetaminophen (PERCOCET) 10-325 MG tablet ° °Thanks Teri °

## 2016-10-29 NOTE — Telephone Encounter (Signed)
Patient has been advised. KW 

## 2016-10-29 NOTE — Telephone Encounter (Signed)
Rx printed and will be placed up front

## 2016-10-29 NOTE — Telephone Encounter (Signed)
Please review. KW 

## 2016-11-27 ENCOUNTER — Other Ambulatory Visit: Payer: Self-pay | Admitting: Physician Assistant

## 2016-11-27 DIAGNOSIS — M1990 Unspecified osteoarthritis, unspecified site: Secondary | ICD-10-CM

## 2016-11-27 MED ORDER — OXYCODONE-ACETAMINOPHEN 10-325 MG PO TABS: 1.0000 | ORAL_TABLET | Freq: Four times a day (QID) | ORAL | 0 refills | Status: DC | PRN

## 2016-11-27 NOTE — Telephone Encounter (Signed)
Patient advised that prescription is up front ready for pick up.  Thanks,  -Joseline

## 2016-11-27 NOTE — Telephone Encounter (Signed)
Please review  ED 

## 2016-11-27 NOTE — Telephone Encounter (Signed)
Refill printed 

## 2016-11-27 NOTE — Telephone Encounter (Signed)
Pt needs refill on  oxyCODONE-acetaminophen (PERCOCET) 10-325 MG tablet   10/29/16 -- Mar Daring, PA-C    Take 1 tablet by mouth every 6 (six) hours as needed.     Thanks, [employer]

## 2016-12-01 ENCOUNTER — Other Ambulatory Visit: Payer: Self-pay | Admitting: Family Medicine

## 2016-12-01 DIAGNOSIS — E119 Type 2 diabetes mellitus without complications: Secondary | ICD-10-CM

## 2016-12-06 ENCOUNTER — Ambulatory Visit: Payer: Medicaid Other | Admitting: Internal Medicine

## 2016-12-06 ENCOUNTER — Telehealth: Payer: Self-pay | Admitting: Physician Assistant

## 2016-12-06 NOTE — Telephone Encounter (Signed)
She can not be seen today. I can see her for her new pt 12/13/16. She can go to [facility] or [facility] in the meantime for her cellulitis. She has a complicated history and an acute prior to establishing care is not appropriate.

## 2016-12-06 NOTE — Telephone Encounter (Signed)
Patient was scheduled to establish 12/06/16 at 9:30am with [facility].  Her sister and transportation called to cancel the appointment at 8:46 am due to weather and stated she could not get her to the appointment.  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  Pt called at 9:29 am and wants to be seen today.  Gave patient next available new pt to establish appointment on 12/13/16.  Pt states she has an area of "cellulitis" and would like to be seen before 12/13/16.  She states she is still established with [facility] [employer], but her insurance has changed to [employer].  Please advise.  Best number to call pt is [phone]

## 2016-12-06 NOTE — Telephone Encounter (Signed)
Spoke with patient, gave options for her to be seen today.  Gave numbers for UC and [facility] walk in clinic per R Baity's direction.

## 2016-12-13 ENCOUNTER — Encounter: Payer: Self-pay | Admitting: Internal Medicine

## 2016-12-13 ENCOUNTER — Encounter (INDEPENDENT_AMBULATORY_CARE_PROVIDER_SITE_OTHER): Payer: Self-pay

## 2016-12-13 ENCOUNTER — Ambulatory Visit (INDEPENDENT_AMBULATORY_CARE_PROVIDER_SITE_OTHER): Payer: BLUE CROSS/BLUE SHIELD | Admitting: Internal Medicine

## 2016-12-13 VITALS — BP 136/84 | HR 85 | Temp 98.9°F | Ht 68.0 in | Wt 347.0 lb

## 2016-12-13 DIAGNOSIS — F329 Major depressive disorder, single episode, unspecified: Secondary | ICD-10-CM

## 2016-12-13 DIAGNOSIS — Z23 Encounter for immunization: Secondary | ICD-10-CM

## 2016-12-13 DIAGNOSIS — I5022 Chronic systolic (congestive) heart failure: Secondary | ICD-10-CM | POA: Diagnosis not present

## 2016-12-13 DIAGNOSIS — E11628 Type 2 diabetes mellitus with other skin complications: Secondary | ICD-10-CM | POA: Diagnosis not present

## 2016-12-13 DIAGNOSIS — I2510 Atherosclerotic heart disease of native coronary artery without angina pectoris: Secondary | ICD-10-CM

## 2016-12-13 DIAGNOSIS — E7800 Pure hypercholesterolemia, unspecified: Secondary | ICD-10-CM | POA: Diagnosis not present

## 2016-12-13 DIAGNOSIS — I10 Essential (primary) hypertension: Secondary | ICD-10-CM

## 2016-12-13 DIAGNOSIS — J301 Allergic rhinitis due to pollen: Secondary | ICD-10-CM

## 2016-12-13 DIAGNOSIS — K279 Peptic ulcer, site unspecified, unspecified as acute or chronic, without hemorrhage or perforation: Secondary | ICD-10-CM

## 2016-12-13 DIAGNOSIS — E119 Type 2 diabetes mellitus without complications: Secondary | ICD-10-CM

## 2016-12-13 DIAGNOSIS — M0579 Rheumatoid arthritis with rheumatoid factor of multiple sites without organ or systems involvement: Secondary | ICD-10-CM

## 2016-12-13 DIAGNOSIS — I214 Non-ST elevation (NSTEMI) myocardial infarction: Secondary | ICD-10-CM

## 2016-12-13 LAB — LIPID PANEL
Cholesterol: 272 mg/dL — ABNORMAL HIGH (ref 0–200)
HDL: 44.8 mg/dL (ref >39.00)
NonHDL: 227.12
Total CHOL/HDL Ratio: 6
Triglycerides: 254 mg/dL — ABNORMAL HIGH (ref 0.0–149.0)
VLDL: 50.8 mg/dL — ABNORMAL HIGH (ref 0.0–40.0)

## 2016-12-13 LAB — COMPREHENSIVE METABOLIC PANEL
ALT: 13 U/L (ref 0–35)
AST: 18 U/L (ref 0–37)
Albumin: 3.6 g/dL (ref 3.5–5.2)
Alkaline Phosphatase: 57 U/L (ref 39–117)
BUN: 13 mg/dL (ref 6–23)
CO2: 31 mEq/L (ref 19–32)
Calcium: 9.3 mg/dL (ref 8.4–10.5)
Chloride: 97 mEq/L (ref 96–112)
Creatinine, Ser: 0.57 mg/dL (ref 0.40–1.20)
GFR: 113.78 mL/min (ref >60.00)
Glucose, Bld: 159 mg/dL — ABNORMAL HIGH (ref 70–99)
Potassium: 4.4 mEq/L (ref 3.5–5.1)
Sodium: 136 mEq/L (ref 135–145)
Total Bilirubin: 0.3 mg/dL (ref 0.2–1.2)
Total Protein: 7.8 g/dL (ref 6.0–8.3)

## 2016-12-13 LAB — CBC
HCT: 36.1 % (ref 36.0–46.0)
Hemoglobin: 11.9 g/dL — ABNORMAL LOW (ref 12.0–15.0)
MCHC: 33.1 g/dL (ref 30.0–36.0)
MCV: 80.3 fl (ref 78.0–100.0)
Platelets: 317 10*3/uL (ref 150.0–400.0)
RBC: 4.49 Mil/uL (ref 3.87–5.11)
RDW: 15.5 % (ref 11.5–15.5)
WBC: 7.4 10*3/uL (ref 4.0–10.5)

## 2016-12-13 LAB — LDL CHOLESTEROL, DIRECT: Direct LDL: 183 mg/dL

## 2016-12-13 LAB — TSH: TSH: 2.13 u[IU]/mL (ref 0.35–4.50)

## 2016-12-13 LAB — T4, FREE: Free T4: 0.93 ng/dL (ref 0.60–1.60)

## 2016-12-13 LAB — HEMOGLOBIN A1C: Hgb A1c MFr Bld: 7.4 % — ABNORMAL HIGH (ref 4.6–6.5)

## 2016-12-13 NOTE — Progress Notes (Signed)
HPI  Pt presents to the clinic today to establis care and for management of the conditions listed below. She is transferring care from [facility]  HTN: She reports she has never been diagnosed with HTN.  Her BP today is 136/84. Previous BP's are 146/82, 135/71 and 150/88. She feels like her blood pressure may be high because she is in pain. She denies chest pain, chest tightness or shortness of breath.  RA: This affects her knees, hips, shoulders and back. She used to be on an infusion medication, but it caused her to have CHF so she had to stop it. She is currently not taking any MTX/Percocet because she has been out of her medication for the last 8 months. She follows with Dr. Jefm Bryant but is requesting referral to a rheumatologist in [place].  Fibromyalgia/Chronic Back Pain: She reports she hurts all over all the time. She has tingling in both of her feet secondary to spinal stenosis. She denies loss of bowel or bladder. She is currently not taking any medication for this.  DM 2: Her last A1C was 6.5 % ,01/2016. She does not test her sugars. She used to be on Glipizide but reports she has not had any medication in the last 8 months. She does have ulcers on her left leg, and reports she often gets cellulitis in that leg. She was recently seen at [facility] and put on abx for cellulitis, but reports she had not noticed any real improvement. Her last eye exam was 01/2016.   Depression: Triggered by her debilitating chronic medical conditions. She has been on Celexa for 6 years and feels like this is controlling her depression. She denies anxiety, SI/HI.  HLD with CAD s/p stents: She denies chest pain. Her cholesterol was last checked about 1 year ago. She is not prescribed any cholesterol lowering medication. She does try to eat a low fat diet. She is taking ASA daily. She follows with Dr. Nehemiah Massed.  Seasonal Allergies: Worse in the spring and fall. She takes Zyrtec as needed with good relief.    CHF, Systolic: She has noticed some swelling in her legs but she denies shortness of breath. She is prescribed Carvedilol but is not taking it. She is not taking anything for edema. She does not weigh herself daily.  History of Stomach Ulcers: Caused by NSAID overuse. She has not been prescribed any medication for this recently. She denies reflux or abdominal pain.  . Flu: 09/2015 Tetanus: < 10 years ago Pneumovax: never Zostovax: never Mammogram: 2012 Pap Smear: unsure Colon Screening: 2005 Vision Screening: 01/2016 Dentist: as needed  Past Medical History:  Diagnosis Date  . Arthritis   . Cellulitis of both lower extremities    Chronic  . Coronary artery disease   . Diabetes mellitus without complication ([place])   . Fibromyalgia   . Fibromyalgia affecting shoulder region     Current Outpatient Prescriptions  Medication Sig Dispense Refill  . aspirin 81 MG tablet Take by mouth.    . B-D INS SYR ULTRAFINE 1CC/30G 30G X 1/2" 1 ML MISC See admin instructions.  2  . cetirizine (ZYRTEC) 10 MG tablet Take 1 tablet (10 mg total) by mouth daily. 90 tablet 1  . citalopram (CELEXA) 40 MG tablet TAKE 1 TABLET BY MOUTH DAILY 90 tablet 3  . glucose blood (ACCU-CHEK AVIVA PLUS) test strip To check blood sugar once a day.  DX: E11.9 100 each 1  . Insulin Syringe-Needle U-100 (B-D INS SYR ULTRAFINE 1CC/30G)  30G X 1/2" 1 ML MISC USE AS DIRECTED    . oxyCODONE-acetaminophen (PERCOCET) 10-325 MG tablet Take 1 tablet by mouth every 6 (six) hours as needed. 120 tablet 0  . penicillin v potassium (VEETID) 500 MG tablet 1 TABLET BY MOUTH TWICE A DAY  0  . carvedilol (COREG) 3.125 MG tablet Take 1 tablet (3.125 mg total) by mouth 2 (two) times daily with a meal. (Patient not taking: Reported on 12/13/2016) 60 tablet 2  . GLIPIZIDE XL 2.5 MG 24 hr tablet TAKE 1 TABLET BY MOUTH DAILY (Patient not taking: Reported on 12/13/2016) 30 tablet 5   No current facility-administered medications for this visit.      Allergies  Allergen Reactions  . Latex     itching  . Doxycycline Rash  . Sulfa Antibiotics Itching, Rash and Swelling    Family History  Problem Relation Age of Onset  . Hypertension Mother   . Cataracts Mother   . Thyroid disease Mother   . Dementia Mother   . Heart failure Mother   . COPD Father   . Dementia Father   . Cataracts Father   . Aneurysm Father     Abdominal & Brain  . Breast cancer Sister   . Fibromyalgia Sister   . Migraines Sister   . Hypertension Sister   . Breast cancer Sister   . COPD Sister   . Uterine cancer Sister   . Migraines Brother   . Heart attack Brother     Social History   Social History  . Marital status: Married    Spouse name: N/A  . Number of children: 2  . Years of education: 9th Grade   Occupational History  . Disabled    Social History Main Topics  . Smoking status: Former Smoker    Quit date: 12/03/1996  . Smokeless tobacco: Never Used  . Alcohol use No  . Drug use: No  . Sexual activity: Yes    Birth control/ protection: None   Other Topics Concern  . Not on file   Social History Narrative  . No narrative on file    ROS:  Constitutional: Pt reports fatigue and weight gain. Denies fever, malaise, headache.  HEENT: Denies eye pain, eye redness, ear pain, ringing in the ears, wax buildup, runny nose, nasal congestion, bloody nose, or sore throat. Respiratory: Pt reports cough. Denies difficulty breathing, shortness of breath, or sputum production.   Cardiovascular: Pt reports swelling in feet. Denies chest pain, chest tightness, palpitations or swelling in the hands.  Gastrointestinal: Denies abdominal pain, bloating, constipation, diarrhea or blood in the stool.  GU: Pt reports urinary incontinence. Denies pain with urination, blood in urine, odor or discharge. Musculoskeletal: Pt reports chronic muscle and joint pain. Denies decrease in range of motion, difficulty with gait, or joint swelling.  Skin: Pt  reports ulcers of her left lower leg. Denies redness, rashes, lesions Neurological: Pt reports difficulty with balance. Denies dizziness, difficulty with memory, difficulty with speech or problems with coordination.  Psych: Pt reports depression. Denies anxiety, SI/HI.  No other specific complaints in a complete review of systems (except as listed in HPI above).  PE:  BP 136/84   Pulse 85   Temp 98.9 F (37.2 C) (Oral)   Ht 5\' 8"  (1.727 m)   Wt (!) 347 lb (157.4 kg)   SpO2 97%   BMI 52.76 kg/m  Wt Readings from Last 3 Encounters:  12/13/16 (!) 347 lb (157.4 kg)  01/31/16 [name])  345 lb (156.5 kg)  09/21/15 (!) 339 lb (153.8 kg)    General: Appears her stated age, obese in NAD. HEENT: Head: normal shape and size; Eyes: sclera white, no icterus, conjunctiva pink, PERRLA and EOMs intact; Throat/Mouth: Teeth present, mucosa pink and moist, no lesions or ulcerations noted.  Neck: Neck supple, trachea midline. No masses, lumps or thyromegaly present.  Cardiovascular: Normal rate and rhythm. S1,S2 noted.  No murmur, rubs or gallops noted. No JVD. 1+ BLE edema. No carotid bruits noted. Pulmonary/Chest: Normal effort and positive vesicular breath sounds. No respiratory distress. No wheezes, rales or ronchi noted.  Abdomen: Soft and nontender. Normal bowel sounds. No distention or masses noted.  Musculoskeletal: Multiple tender points. Gait slow but steady with the use of a cane. Neurological: Alert and oriented. Sensation decreased in BLE. Psychiatric: Mood and affect normal. Behavior is normal. Judgment and thought content normal.     BMET    Component Value Date/Time   NA 135 09/04/2015 2209   NA 138 08/31/2014 0533   K 4.4 09/04/2015 2209   K 4.0 08/31/2014 0533   CL 100 (L) 09/04/2015 2209   CL 104 08/31/2014 0533   CO2 29 09/04/2015 2209   CO2 28 08/31/2014 0533   GLUCOSE 180 (H) 09/04/2015 2209   GLUCOSE 116 (H) 08/31/2014 0533   BUN 11 09/04/2015 2209   BUN 12 08/31/2014  0533   CREATININE 0.66 09/04/2015 2209   CREATININE 0.63 08/31/2014 0533   CALCIUM 9.7 09/04/2015 2209   CALCIUM 8.2 (L) 08/31/2014 0533   GFRNONAA >60 09/04/2015 2209   GFRNONAA >60 08/31/2014 0533   GFRNONAA >60 11/05/2013 0647   GFRAA >60 09/04/2015 2209   GFRAA >60 08/31/2014 0533   GFRAA >60 11/05/2013 0647    Lipid Panel  No results found for: CHOL, TRIG, HDL, CHOLHDL, VLDL, LDLCALC  CBC    Component Value Date/Time   WBC 11.5 (H) 09/04/2015 2209   RBC 5.08 09/04/2015 2209   HGB 13.5 09/04/2015 2209   HGB 11.0 (L) 08/31/2014 0533   HCT 41.1 09/04/2015 2209   HCT 33.9 (L) 08/31/2014 0533   PLT 274 09/04/2015 2209   PLT 230 08/31/2014 0533   MCV 81.0 09/04/2015 2209   MCV 86 08/31/2014 0533   MCH 26.6 09/04/2015 2209   MCHC 32.9 09/04/2015 2209   RDW 17.5 (H) 09/04/2015 2209   RDW 16.3 (H) 08/31/2014 0533   LYMPHSABS 1.4 08/31/2014 0533   MONOABS 0.5 08/31/2014 0533   EOSABS 0.2 08/31/2014 0533   BASOSABS 0.0 08/31/2014 0533    Hgb A1C Lab Results  Component Value Date   HGBA1C 6.5 01/31/2016     Assessment and Plan:

## 2016-12-14 ENCOUNTER — Encounter: Payer: Self-pay | Admitting: Internal Medicine

## 2016-12-14 DIAGNOSIS — E1159 Type 2 diabetes mellitus with other circulatory complications: Secondary | ICD-10-CM | POA: Insufficient documentation

## 2016-12-14 DIAGNOSIS — I152 Hypertension secondary to endocrine disorders: Secondary | ICD-10-CM | POA: Insufficient documentation

## 2016-12-14 DIAGNOSIS — E7800 Pure hypercholesterolemia, unspecified: Secondary | ICD-10-CM | POA: Insufficient documentation

## 2016-12-14 DIAGNOSIS — I10 Essential (primary) hypertension: Secondary | ICD-10-CM | POA: Insufficient documentation

## 2016-12-14 DIAGNOSIS — E1169 Type 2 diabetes mellitus with other specified complication: Secondary | ICD-10-CM | POA: Insufficient documentation

## 2016-12-14 NOTE — Patient Instructions (Signed)
Hypertension Hypertension is another name for high blood pressure. High blood pressure forces your heart to work harder to pump blood. A blood pressure reading has two numbers, which includes a higher number over a lower number (example: 110/72). Follow these instructions at home:  Have your blood pressure rechecked by your doctor.  Only take medicine as told by your doctor. Follow the directions carefully. The medicine does not work as well if you skip doses. Skipping doses also puts you at risk for problems.  Do not smoke.  Monitor your blood pressure at home as told by your doctor. Contact a doctor if:  You think you are having a reaction to the medicine you are taking.  You have repeat headaches or feel dizzy.  You have puffiness (swelling) in your ankles.  You have trouble with your vision. Get help right away if:  You get a very bad headache and are confused.  You feel weak, numb, or faint.  You get chest or belly (abdominal) pain.  You throw up (vomit).  You cannot breathe very well. This information is not intended to replace advice given to you by your health care provider. Make sure you discuss any questions you have with your health care provider. Document Released: 05/07/2008 Document Revised: 04/26/2016 Document Reviewed: 09/11/2013 Elsevier Interactive Patient Education  2017 [employer]  

## 2016-12-14 NOTE — Assessment & Plan Note (Signed)
I would like to start her on Cymbalta in place of Celexa to help with her pain as well as her depression Will discuss this with her via phone. If she is okay with the plan, will send in eRx for Cymbalta and D/C Celexa If not, will refill Celexa

## 2016-12-14 NOTE — Assessment & Plan Note (Signed)
No angina CMET and lipid profile today If LDL  >70, will start Zocor Encouraged her to consume a low fat diet  Continue baby ASA daily

## 2016-12-14 NOTE — Assessment & Plan Note (Signed)
CMET and lipid profile today Encouraged her to consume a low fat diet Advised her if LDL  >70, will start Zocor

## 2016-12-14 NOTE — Assessment & Plan Note (Signed)
Encouraged her to continue Zyrtec prn Refilled today Will monitor

## 2016-12-14 NOTE — Assessment & Plan Note (Addendum)
CMET today to check liver function Will refer to rheumatology for treatment plan Will start Meloxicam for now

## 2016-12-14 NOTE — Assessment & Plan Note (Signed)
Secondary to NSAID overuse I plan on starting her on Meloxicam, so will also start Prilosec 20 mg daily Advised her to avoid NSAID's OTC

## 2016-12-14 NOTE — Assessment & Plan Note (Signed)
Will restart her Carvedilol Encouraged her to continue ASA She will continue to follow with Dr. Nehemiah Massed

## 2016-12-14 NOTE — Assessment & Plan Note (Signed)
Will add Losartan 25 mg daily Discussed the importance of a low salt diet, exercise an dweight loss  RTC in 3 weeks to recheck BP

## 2016-12-14 NOTE — Assessment & Plan Note (Signed)
A1C today Encouraged her to consume a low fat, low carb diet and exercise to lose weight If A1C > 8%, will start Glipizide We will send her in a meter, strips and lancets Foot exam today Flu and pneumovax today Encouraged her to continue yearly eye exam

## 2016-12-14 NOTE — Assessment & Plan Note (Signed)
Some increased edema but fairly well compensated. Echo from 09/2015 reviewed CMET today If kidney function normal, start Lasix 20 mg daily Advised her to restart Carvedilol, eRx sent to pharmacy I would like tighter blood pressure control- will start Losartan 25 mg daily

## 2016-12-15 MED ORDER — OMEPRAZOLE 20 MG PO CPDR: 20.0000 mg | DELAYED_RELEASE_CAPSULE | Freq: Every day | ORAL | 2 refills | Status: DC

## 2016-12-15 MED ORDER — ASPIRIN 81 MG PO TABS: 81.0000 mg | ORAL_TABLET | Freq: Every day | ORAL | 2 refills | Status: DC

## 2016-12-15 MED ORDER — CARVEDILOL 3.125 MG PO TABS: 3.1250 mg | ORAL_TABLET | Freq: Two times a day (BID) | ORAL | 2 refills | Status: DC

## 2016-12-15 MED ORDER — CETIRIZINE HCL 10 MG PO TABS: 10.0000 mg | ORAL_TABLET | Freq: Every day | ORAL | 2 refills | Status: DC

## 2016-12-15 MED ORDER — GLIPIZIDE 5 MG PO TABS: 5.0000 mg | ORAL_TABLET | Freq: Two times a day (BID) | ORAL | 2 refills | Status: DC

## 2016-12-15 MED ORDER — DULOXETINE HCL 30 MG PO CPEP: 30.0000 mg | ORAL_CAPSULE | Freq: Every day | ORAL | 2 refills | Status: DC

## 2016-12-15 MED ORDER — GLUCOSE BLOOD VI STRP: ORAL_STRIP | 1 refills | Status: DC

## 2016-12-15 MED ORDER — LOSARTAN POTASSIUM 25 MG PO TABS: 25.0000 mg | ORAL_TABLET | Freq: Every day | ORAL | 0 refills | Status: DC

## 2016-12-15 MED ORDER — SIMVASTATIN 20 MG PO TABS: 20.0000 mg | ORAL_TABLET | Freq: Every day | ORAL | 2 refills | Status: DC

## 2016-12-15 MED ORDER — MELOXICAM 15 MG PO TABS: 15.0000 mg | ORAL_TABLET | Freq: Every day | ORAL | 2 refills | Status: DC

## 2016-12-15 MED ORDER — FUROSEMIDE 20 MG PO TABS: 20.0000 mg | ORAL_TABLET | Freq: Every day | ORAL | 2 refills | Status: DC

## 2016-12-17 NOTE — Addendum Note (Signed)
Addended by: Lurlean Nanny on: 12/17/2016 06:02 PM   Modules accepted: Orders

## 2016-12-21 ENCOUNTER — Encounter: Payer: Self-pay | Admitting: Internal Medicine

## 2016-12-22 MED ORDER — DERMAGRAN HYDROGEL WOUND EX GEL: 1.0000 "application " | Freq: Every day | CUTANEOUS | 1 refills | Status: DC

## 2016-12-24 ENCOUNTER — Other Ambulatory Visit: Payer: Self-pay

## 2016-12-24 DIAGNOSIS — M1990 Unspecified osteoarthritis, unspecified site: Secondary | ICD-10-CM

## 2016-12-24 MED ORDER — ATRAPRO HYDROGEL EX GEL: 1.0000 "application " | Freq: Every day | CUTANEOUS | 2 refills | Status: DC

## 2016-12-24 NOTE — Telephone Encounter (Signed)
How often does she actually take this. I do not fill long term narcotics.

## 2016-12-24 NOTE — Telephone Encounter (Signed)
Pt left v/m requesting rx oxycodone apap. Call when ready for pick up . Last printed # 120 on 11/27/16. Last seen 12/13/16.

## 2016-12-24 NOTE — Addendum Note (Signed)
Addended by: Jearld Fenton on: 12/24/2016 09:49 PM   Modules accepted: Orders

## 2016-12-25 ENCOUNTER — Other Ambulatory Visit: Payer: Self-pay | Admitting: Internal Medicine

## 2016-12-25 DIAGNOSIS — G894 Chronic pain syndrome: Secondary | ICD-10-CM

## 2016-12-25 DIAGNOSIS — M059 Rheumatoid arthritis with rheumatoid factor, unspecified: Secondary | ICD-10-CM

## 2016-12-25 NOTE — Telephone Encounter (Signed)
Pt states she is having to take the medicine every 4-6 hours. Pt also states that her previous PCP prescribed the medication but she will go where you would like her to go so that she can continue medication as it is difficult getting around without it... Pt would also like to pick up the Rx tomorrow because she reports it usually takes another day before she can get her Rx and does not want to run out... Please advise

## 2016-12-25 NOTE — Telephone Encounter (Signed)
Will refill and will refer to chronic pain management

## 2016-12-26 MED ORDER — OXYCODONE-ACETAMINOPHEN 10-325 MG PO TABS: 1.0000 | ORAL_TABLET | Freq: Four times a day (QID) | ORAL | 0 refills | Status: DC | PRN

## 2016-12-26 NOTE — Telephone Encounter (Signed)
Rx left in front office for pick up and pt is aware  

## 2016-12-26 NOTE — Telephone Encounter (Signed)
Rx printed and placed in your box

## 2016-12-30 ENCOUNTER — Encounter: Payer: Self-pay | Admitting: Internal Medicine

## 2016-12-31 ENCOUNTER — Other Ambulatory Visit: Payer: Self-pay | Admitting: Internal Medicine

## 2016-12-31 DIAGNOSIS — E13622 Other specified diabetes mellitus with other skin ulcer: Secondary | ICD-10-CM

## 2016-12-31 DIAGNOSIS — L97909 Non-pressure chronic ulcer of unspecified part of unspecified lower leg with unspecified severity: Principal | ICD-10-CM

## 2016-12-31 DIAGNOSIS — L03818 Cellulitis of other sites: Secondary | ICD-10-CM

## 2016-12-31 MED ORDER — AMOXICILLIN-POT CLAVULANATE 875-125 MG PO TABS: 1.0000 | ORAL_TABLET | Freq: Two times a day (BID) | ORAL | 0 refills | Status: DC

## 2017-01-04 ENCOUNTER — Ambulatory Visit: Payer: BLUE CROSS/BLUE SHIELD | Admitting: Surgery

## 2017-01-08 ENCOUNTER — Encounter: Payer: BLUE CROSS/BLUE SHIELD | Attending: Surgery | Admitting: Internal Medicine

## 2017-01-08 DIAGNOSIS — I110 Hypertensive heart disease with heart failure: Secondary | ICD-10-CM | POA: Insufficient documentation

## 2017-01-08 DIAGNOSIS — Z96652 Presence of left artificial knee joint: Secondary | ICD-10-CM | POA: Diagnosis not present

## 2017-01-08 DIAGNOSIS — E11622 Type 2 diabetes mellitus with other skin ulcer: Secondary | ICD-10-CM | POA: Diagnosis present

## 2017-01-08 DIAGNOSIS — Z8711 Personal history of peptic ulcer disease: Secondary | ICD-10-CM | POA: Insufficient documentation

## 2017-01-08 DIAGNOSIS — M069 Rheumatoid arthritis, unspecified: Secondary | ICD-10-CM | POA: Insufficient documentation

## 2017-01-08 DIAGNOSIS — Z6841 Body Mass Index (BMI) 40.0 and over, adult: Secondary | ICD-10-CM | POA: Insufficient documentation

## 2017-01-08 DIAGNOSIS — F419 Anxiety disorder, unspecified: Secondary | ICD-10-CM | POA: Diagnosis not present

## 2017-01-08 DIAGNOSIS — Z7984 Long term (current) use of oral hypoglycemic drugs: Secondary | ICD-10-CM | POA: Diagnosis not present

## 2017-01-08 DIAGNOSIS — I87332 Chronic venous hypertension (idiopathic) with ulcer and inflammation of left lower extremity: Secondary | ICD-10-CM | POA: Insufficient documentation

## 2017-01-08 DIAGNOSIS — I890 Lymphedema, not elsewhere classified: Secondary | ICD-10-CM | POA: Diagnosis not present

## 2017-01-08 DIAGNOSIS — E1142 Type 2 diabetes mellitus with diabetic polyneuropathy: Secondary | ICD-10-CM | POA: Diagnosis not present

## 2017-01-08 DIAGNOSIS — L97221 Non-pressure chronic ulcer of left calf limited to breakdown of skin: Secondary | ICD-10-CM | POA: Diagnosis not present

## 2017-01-08 DIAGNOSIS — I509 Heart failure, unspecified: Secondary | ICD-10-CM | POA: Insufficient documentation

## 2017-01-09 NOTE — Progress Notes (Signed)
Capozzoli, MARVIE BASOM ([id number]) Visit Report for 01/08/2017 Abuse/Suicide Risk Screen Details Patient Name: [name] [name], [name] Date of Service: 01/08/2017 9:45 AM Medical Record Patient Account Number: [id number] Number: Treating RN: Cornell Barman [birth year] [id number] y.o. Other Clinician: Date of Birth/Sex: Female) Treating ROBSON, MICHAEL Primary Care Shelby Anderle: Webb Silversmith Edgar Reisz/Extender: G Referring Nakenya Theall: Webb Silversmith Weeks in Treatment: 0 Abuse/Suicide Risk Screen Items Answer ABUSE/SUICIDE RISK SCREEN: Has anyone close to you tried to hurt or harm you recentlyo No Do you feel uncomfortable with anyone in your familyo No Has anyone forced you do things that you didnot want to doo No Do you have any thoughts of harming yourselfo No Patient displays signs or symptoms of abuse and/or neglect. No Electronic Signature(s) Signed: 01/08/2017 5:09:04 PM By: Gretta Cool, RN, BSN, Kim RN, BSN Entered By: Gretta Cool, RN, BSN, Kim on 01/08/2017 10:22:40 [name] [name], [name] ([id number]) -------------------------------------------------------------------------------- Activities of Daily Living Details Patient Name: [name] [name], [name] Date of Service: 01/08/2017 9:45 AM Medical Record Patient Account Number: [id number] Number: Treating RN: Cornell Barman Nov 30, 1951 [id number] y.o. Other Clinician: Date of Birth/Sex: Female) Treating ROBSON, MICHAEL Primary Care Reford Olliff: Webb Silversmith Destenee Guerry/Extender: G Referring Alexio Sroka: Webb Silversmith Weeks in Treatment: 0 Activities of Daily Living Items Answer Activities of Daily Living (Please select one for each item) Drive Automobile Completely Able Take Medications Completely Able Use Telephone Completely Able Care for Appearance Completely Able Use Toilet Completely Able Bath / Shower Completely Able Dress Self Completely Able Feed Self Completely Able Walk Completely Able Get In / Out Bed Completely Able Housework Completely Able Prepare Meals  Completely [place] for Self Completely Able Electronic Signature(s) Signed: 01/08/2017 5:09:04 PM By: Gretta Cool, RN, BSN, Kim RN, BSN Entered By: Gretta Cool, RN, BSN, Kim on 01/08/2017 10:22:55 [name] [name], [name] ([id number]) -------------------------------------------------------------------------------- Education Assessment Details Patient Name: [name], [name] Date of Service: 01/08/2017 9:45 AM Medical Record Patient Account Number: [id number] Number: Treating RN: Cornell Barman [birth year] [id number] y.o. Other Clinician: Date of Birth/Sex: Female) Treating ROBSON, MICHAEL Primary Care Delaney Perona: Webb Silversmith Carden Teel/Extender: G Referring Chelsy Parrales: Colon Flattery in Treatment: 0 Primary Learner Assessed: Patient Learning Preferences/Education Level/Primary Language Learning Preference: Explanation, Demonstration, Printed Material Highest Education Level: High School Preferred Language: English Cognitive Barrier Assessment/Beliefs Language Barrier: No Translator Needed: No Memory Deficit: No Emotional Barrier: No Cultural/Religious Beliefs Affecting Medical No Care: Physical Barrier Assessment Impaired Vision: Yes Glasses Impaired Hearing: No Decreased Hand dexterity: No Knowledge/Comprehension Assessment Knowledge Level: High Comprehension Level: High Ability to understand written High instructions: Ability to understand verbal High instructions: Motivation Assessment Anxiety Level: Calm Cooperation: Cooperative Education Importance: Acknowledges Need Interest in Health Problems: Asks Questions Perception: Coherent Willingness to Engage in Self- High Management Activities: Readiness to Engage in Self- High Management Activities: [name] [name] ([id number]) Electronic Signature(s) Signed: 01/08/2017 5:09:04 PM By: Gretta Cool, RN, BSN, Kim RN, BSN Entered By: Gretta Cool, RN, BSN, Kim on 01/08/2017 10:23:25 [name] [name], [name]  ([id number]) -------------------------------------------------------------------------------- Fall Risk Assessment Details Patient Name: [name] [name], [name] Date of Service: 01/08/2017 9:45 AM Medical Record Patient Account Number: [id number] Number: Treating RN: Cornell Barman [birth year] [id number] y.o. Other Clinician: Date of Birth/Sex: Female) Treating ROBSON, MICHAEL Primary Care Zettie Gootee: Webb Silversmith Jeniyah Menor/Extender: G Referring Chennel Olivos: Colon Flattery in Treatment: 0 Fall Risk Assessment Items Have you had 2 or more falls in the last 12 monthso 0 No Have you had any fall that resulted in injury in the last 12 monthso 0 No FALL RISK ASSESSMENT: History  of falling - immediate or within 3 months 0 No Secondary diagnosis 0 No Ambulatory aid None/bed rest/wheelchair/nurse 0 No Crutches/cane/walker 15 Yes Furniture 0 No IV Access/Saline Lock 0 No Gait/Training Normal/bed rest/immobile 0 No Weak 10 Yes Impaired 0 No Mental Status Oriented to own ability 0 Yes Electronic Signature(s) Signed: 01/08/2017 5:09:04 PM By: Gretta Cool, RN, BSN, Kim RN, BSN Entered By: Gretta Cool, RN, BSN, Kim on 01/08/2017 10:23:55 [name] [name], [name] ([id number]) -------------------------------------------------------------------------------- Foot Assessment Details Patient Name: [name] [name], [name] Date of Service: 01/08/2017 9:45 AM Medical Record Patient Account Number: [id number] Number: Treating RN: Cornell Barman 1951/08/01 [id number] y.o. Other Clinician: Date of Birth/Sex: Female) Treating ROBSON, MICHAEL Primary Care Sabastion Hrdlicka: Webb Silversmith Analise Glotfelty/Extender: G Referring Anav Lammert: Webb Silversmith Weeks in Treatment: 0 Foot Assessment Items Site Locations + = Sensation present, - = Sensation absent, C = Callus, U = Ulcer R = Redness, W = Warmth, M = Maceration, PU = Pre-ulcerative lesion F = Fissure, S = Swelling, D = Dryness Assessment Right: Left: Other Deformity: No No Prior Foot Ulcer: No  No Prior Amputation: No No Charcot Joint: No No Ambulatory Status: Ambulatory With Help Assistance Device: Cane GaitEnergy manager) Signed: 01/08/2017 5:09:04 PM By: Gretta Cool, RN, BSN, Kim RN, BSN Entered By: Gretta Cool, RN, BSN, Kim on 01/08/2017 10:25:42 [name] [name], [name] ([id number]) [name] [name], [name] ([id number]) -------------------------------------------------------------------------------- Nutrition Risk Assessment Details Patient Name: [name] [name], [name] Date of Service: 01/08/2017 9:45 AM Medical Record Patient Account Number: [id number] Number: Treating RN: Cornell Barman [birth year] [id number] y.o. Other Clinician: Date of Birth/Sex: Female) Treating ROBSON, MICHAEL Primary Care Andersyn Fragoso: Webb Silversmith Ariell Gunnels/Extender: G Referring Stasha Naraine: Webb Silversmith Weeks in Treatment: 0 Height (in): 67 Weight (lbs): 338 Body Mass Index (BMI): 52.9 Nutrition Risk Assessment Items NUTRITION RISK SCREEN: I have an illness or condition that made me change the kind and/or 0 No amount of food I eat I eat fewer than two meals per day 0 No I eat few fruits and vegetables, or milk products 0 No I have three or more drinks of beer, liquor or wine almost every day 0 No I have tooth or mouth problems that make it hard for me to eat 0 No I don't always have enough money to buy the food I need 0 No I eat alone most of the time 0 No I take three or more different prescribed or over-the-counter drugs a 1 Yes day Without wanting to, I have lost or gained 10 pounds in the last six 0 No months I am not always physically able to shop, cook and/or feed myself 0 No Nutrition Protocols Good Risk Protocol 0 No interventions needed Moderate Risk Protocol Electronic Signature(s) Signed: 01/08/2017 5:09:04 PM By: Gretta Cool, RN, BSN, Kim RN, BSN Entered By: Gretta Cool, RN, BSN, Kim on 01/08/2017 10:24:06

## 2017-01-10 NOTE — Progress Notes (Signed)
[name] [name] MINIHAN ([id number]) Visit Report for 01/08/2017 Chief Complaint Document Details Patient Name: [name] [name] D. Date of Service: 01/08/2017 9:45 AM Medical Record Patient Account Number: [id number] Number: Treating RN: Ahmed Prima 03/28/1951 (65 y.o. Other Clinician: Date of Birth/Sex: Female) Treating [name] [name] Primary Care Provider: Webb Silversmith Provider/Extender: G Referring Provider: Webb Silversmith Weeks in Treatment: 0 Information Obtained from: Patient Chief Complaint chronic venous hypertension with ulcer formation left lower extremity 01/08/17; patient presents today with ulcerations on the left lateral lower leg Electronic Signature(s) Signed: 01/09/2017 4:28:51 PM By: Linton Ham MD Entered By: Linton Ham on 01/08/2017 11:43:45 [name] [name] D. ([id number]) -------------------------------------------------------------------------------- HPI Details Patient Name: [name] [name] D. Date of Service: 01/08/2017 9:45 AM Medical Record Patient Account Number: [id number] Number: Treating RN: Ahmed Prima 04-29-1951 (65 y.o. Other Clinician: Date of Birth/Sex: Female) Treating [name] [name], [place] Primary Care Provider: Webb Silversmith Provider/Extender: G Referring Provider: Webb Silversmith Weeks in Treatment: 0 History of Present Illness Location: left lower extremity Severity: improved since her initial hospitalization but stable since Duration: 6 weeks Context: began after her third treatment of Remicade for rheumatoid arthritis Modifying Factors: morbid obesity, chronic venous hypertension, lymphedema, diabetes mellitus type 2, methotrexate use, Remicade use, Associated Signs and Symptoms: pain and swelling left lower extremity HPI Description: she was seen last week where a 2 layer light compression system was applied. She removed that on Sunday because of itching. She states that she did see her primary care physician who has  prescribed her a stronger diuretic and she will begin that today. When she removed her dressing she applied some Neosporin and some gauze. There remains considerable concern about her ability to keep the wrap in place for a week she has been prescribed lymphedema pumps but she does not use those either. We will encourage her to use those twice a day for an hour each time. These were prescribed to her by Dr. Hinton Lovely READMISSION 01/04/17; this is a patient who is a type II diabetic on oral agents. I note she was seen in this clinic 2-3 years ago. I was not involved in her care at that point. She tells [name] that she is had ulcers on her lateral left lower extremity since just before Christmas. There was no obvious precipitant to this. She apparently has been on long-term suppressive penicillin prescribed by Dr. 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 She has noted increasing erythema along with all of this and I think this is what prompted her to come in today. She does not have a known history of PAD or neuropathy hemoglobin A1c in this clinic was 1.06 on the left. The patient has not been requiring any compression on her legs although she does have compression stockings at home as well as external compression pumps. She has been using neither of these. She has not been systemically unwell the patient is also known to the local vein and vascular group. Apparently her external compression pumps were previously prescribed by Dr. Delana Meyer. She has had apparent bilateral ablations done in the past although I have none of these records. Electronic Signature(s) Signed: 01/09/2017 4:28:51 PM By: Linton Ham MD Entered By: Linton Ham on 01/08/2017 12:06:58 [name] [name] D. ([id number]) -------------------------------------------------------------------------------- Physical Exam  Details Patient Name: [name], [name] Date of Service: 01/08/2017 9:45 AM Medical Record Patient Account Number: [id number] Number: Treating RN: Ahmed Prima  [birth year] (66 y.o. Other Clinician: Date of Birth/Sex: Female) Treating [name] [name] Primary Care Provider: Webb Silversmith Provider/Extender: G Referring Provider: Webb Silversmith Weeks in Treatment: 0 Constitutional Sitting or standing Blood Pressure is within target range for patient.. Pulse regular and within target range for patient.[name] Respirations regular, non-labored and within target range.. Temperature is normal and within the target range for the patient.. Patient is not in any distress, morbid obesity noted. Eyes Conjunctivae clear. No discharge.[name] Respiratory Respiratory effort is easy and symmetric bilaterally. Rate is normal at rest and on room air.. Bilateral breath sounds are clear and equal in all lobes with no wheezes, rales or rhonchi.. Cardiovascular Heart rhythm and rate regular, without murmur or gallop.. I had difficulty feeling her femoral artery although I think this was probably more to do with clothing and soft tissue. Pedal pulses palpable on the left. Gastrointestinal (GI) Obese but no masses. Lymphatic Nonpalpable in the left popliteal or inguinal area. Patient is tender in the left popliteal states because of a Baker's cyst. Integumentary (Hair, Skin) Left leg significant erythema which is well demarcated. We looked back on pictures from her previous stay in this clinic this area was similarly affected.. Neurological Notable for reduced vibration sense. Psychiatric No evidence of depression, anxiety, or agitation. Calm, cooperative, and communicative. Appropriate interactions and affect.. Notes Wound exam; significant erythema on the anterior lower left leg and lateral left leg extending into the lateral part of her foot. She has open areas on the lower lateral left leg with the  largest being the size of a quarter. No debridement was needed for the wounds. There is significant erythema and warmth but no real palpable tenderness. On the lateral aspect of the left leg the erythematous well demarcated. I do not believe that this patient has recurrent cellulitis Electronic Signature(s) Signed: 01/09/2017 4:28:51 PM By: Linton Ham MD [name] [name] D. ([id number]) Entered By: Linton Ham on 01/08/2017 12:14:03 [name] [name] D. ([id number]) -------------------------------------------------------------------------------- Physician Orders Details Patient Name: [name] [name] D. Date of Service: 01/08/2017 9:45 AM Medical Record Patient Account Number: [id number] Number: Treating RN: Cornell Barman 12/18/1950 [id number] y.o. Other Clinician: Date of Birth/Sex: Female) Treating Meghana Tullo Primary Care Provider: Webb Silversmith Provider/Extender: G Referring Provider: Colon Flattery in Treatment: 0 Verbal / Phone Orders: No Diagnosis Coding Wound Cleansing Wound #2 Left,Distal,Lateral Lower Leg o Clean wound with Normal Saline. Wound #3 Left,Medial Lower Leg o Clean wound with Normal Saline. Wound #4 Left,Lateral Lower Leg o Clean wound with Normal Saline. Wound #5 Left,Lateral Lower Leg o Clean wound with Normal Saline. Anesthetic Wound #2 Left,Distal,Lateral Lower Leg o Topical Lidocaine 4% cream applied to wound bed prior to debridement Wound #3 Left,Medial Lower Leg o Topical Lidocaine 4% cream applied to wound bed prior to debridement Wound #4 Left,Lateral Lower Leg o Topical Lidocaine 4% cream applied to wound bed prior to debridement Wound #5 Left,Lateral Lower Leg o Topical Lidocaine 4% cream applied to wound bed prior to debridement Skin Barriers/Peri-Wound Care Wound #2 Left,Distal,Lateral Lower Leg o Triamcinolone Acetonide Ointment Wound #3 Left,Medial Lower Leg o Triamcinolone Acetonide Ointment Wound #4 Left,Lateral  Lower Leg o Triamcinolone Acetonide Ointment [name] [name] D. ([id number]) Wound #5 Left,Lateral Lower Leg o Triamcinolone Acetonide Ointment Primary Wound Dressing Wound #2 Left,Distal,Lateral Lower Leg o Aquacel Ag Wound #3 Left,Medial Lower Leg o Aquacel Ag Wound #4 Left,Lateral Lower Leg o Aquacel Ag Wound #5 Left,Lateral Lower Leg o Aquacel Ag Secondary Dressing Wound #2 Left,Distal,Lateral Lower Leg o ABD pad  Wound #3 Left,Medial Lower Leg o ABD pad Wound #4 Left,Lateral Lower Leg o ABD pad Wound #5 Left,Lateral Lower Leg o ABD pad Dressing Change Frequency Wound #2 Left,Distal,Lateral Lower Leg o Change dressing every week o Other: - Nurse Visit as needed Wound #3 Left,Medial Lower Leg o Change dressing every week o Other: - Nurse Visit as needed Wound #4 Left,Lateral Lower Leg o Change dressing every week o Other: - Nurse Visit as needed Wound #5 Left,Lateral Lower Leg o Change dressing every week o Other: - Nurse Visit as needed Follow-up Appointments [name] [name] D. ([id number]) Wound #2 Left,Distal,Lateral Lower Leg o Return Appointment in 1 week. Wound #3 Left,Medial Lower Leg o Return Appointment in 1 week. Wound #4 Left,Lateral Lower Leg o Return Appointment in 1 week. Wound #5 Left,Lateral Lower Leg o Return Appointment in 1 week. Edema Control Wound #2 Left,Distal,Lateral Lower Leg o 3 Layer Compression System - Left Lower Extremity Wound #3 Left,Medial Lower Leg o 3 Layer Compression System - Left Lower Extremity Wound #4 Left,Lateral Lower Leg o 3 Layer Compression System - Left Lower Extremity Wound #5 Left,Lateral Lower Leg o 3 Layer Compression System - Left Lower Extremity Electronic Signature(s) Signed: 01/08/2017 5:09:04 PM By: Gretta Cool RN, BSN, Kim RN, BSN Signed: 01/09/2017 4:28:51 PM By: Linton Ham MD Entered By: Gretta Cool, RN, BSN, Kim on 01/08/2017 11:05:38 [name] [name] D.  ([id number]) -------------------------------------------------------------------------------- Problem List Details Patient Name: Spohr, Khamora D. Date of Service: 01/08/2017 9:45 AM Medical Record Patient Account Number: [id number] Number: Treating RN: Ahmed Prima 22-Jun-1951 (65 y.o. 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 ([id number]) -------------------------------------------------------------------------------- Progress Note Details Patient Name: [name] [name] D. Date of Service: 01/08/2017 9:45 AM Medical Record Patient Account Number: [id number] Number: Treating RN: Ahmed Prima June 16, 1951 (65 y.o. Other Clinician: Date of Birth/Sex: Female) Treating Mylani Gentry Primary Care Provider: Webb Silversmith Provider/Extender: G Referring Provider: Colon Flattery in Treatment: 0 Subjective Chief Complaint Information obtained from Patient chronic venous hypertension with ulcer formation left lower extremity 01/08/17; patient presents today with ulcerations on the left lateral lower leg History of Present Illness (HPI) The following HPI elements were documented for the patient's wound: Location: left lower  extremity Severity: improved since her initial hospitalization but stable since Duration: 6 weeks Context: began after her third treatment of Remicade for rheumatoid arthritis Modifying Factors: morbid obesity, chronic venous hypertension, lymphedema, diabetes mellitus type 2, methotrexate use, Remicade use, Associated Signs and Symptoms: pain and swelling left lower extremity she was seen last week where a 2 layer light compression system was applied. She removed that on Sunday because of itching. She states that she did see her primary care physician who has prescribed her a stronger diuretic and she will begin that today. When she removed her dressing she applied some Neosporin and some gauze. There remains considerable concern about her ability to keep the wrap in place for a week  she has been prescribed lymphedema pumps but she does not use those either. We will encourage her to use those twice a day for an hour each time. These were prescribed to her by Dr. Hinton Lovely READMISSION 01/04/17; this is a patient who is a type II diabetic on oral agents. I note she was seen in this clinic 2-3 years ago. I was not involved in her care at that point. She tells [name] that she is had ulcers on her lateral left lower extremity since just before Christmas. There was no obvious precipitant to this. She apparently has been on long-term suppressive penicillin prescribed by Dr. 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 She has noted increasing erythema along with all of this and I think this is what prompted her to come in today. She does not have a known history of PAD or neuropathy hemoglobin A1c in this clinic was 1.06 on the left. The patient has not been requiring any compression on her legs although she does have compression stockings at home as well as external compression pumps. She has been  using neither of these. She has not been systemically unwell the patient is also known to the local vein and vascular group. Apparently her external compression pumps Batson, Kessie D. ([id number]) were previously prescribed by Dr. Delana Meyer. She has had apparent bilateral ablations done in the past although I have none of these records. Wound History Patient presents with 8 open wounds that have been present for approximately 2 months. Patient has been treating wounds in the following manner: siler sulfadine. The wounds have been healed in the past but have re-opened. Laboratory tests have not been performed in the last month. Patient reportedly has tested positive for an antibiotic resistant organism. Patient reportedly has not tested positive for osteomyelitis. Patient reportedly has had testing performed to evaluate circulation in the legs. Patient experiences the following problems associated with their wounds: infection, swelling. Patient History Information obtained from Patient, Chart. Allergies sulfa drugs Family History Cancer - Siblings, Heart Disease - Father, Hypertension - Mother, Father, Lung Disease - Father, Thyroid Problems - Mother, No family history of Diabetes, Hereditary Spherocytosis, Kidney Disease, Seizures, Stroke, Tuberculosis. Social History Never smoker, Marital Status - Married, Alcohol Use - Never, Drug Use - No History, Caffeine Use - Daily - coffee. Medical History Hematologic/Lymphatic Denies history of Hemophilia, Human Immunodeficiency Virus Cardiovascular Patient has history of Congestive Heart Failure Denies history of Angina, Arrhythmia, Deep Vein Thrombosis, Hypotension, Myocardial Infarction, Peripheral Arterial Disease, Peripheral Venous Disease, Phlebitis, Vasculitis Gastrointestinal Denies history of Cirrhosis , Colitis, Crohn s, Hepatitis A, Hepatitis B, Hepatitis C Endocrine Patient has history of Type II Diabetes - 01/2016 6.5 Denies history  of Type I Diabetes Integumentary (Skin) Denies history of History of Burn, History of pressure wounds Patient is treated with Oral Agents. Blood sugar is tested. Blood sugar results noted at the following times: Breakfast - 123. Hospitalization/Surgery History - 08/30/2014, [facility], cellulitis. - 09/02/2016, [place], hernia. Medical And Surgical History Notes Constitutional Symptoms (General Health) CHF, Spinal Stenosis, RA, DM, fibromyalgia, CAD Noren, Milika D. 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 Eyes The patient has no complaints or symptoms. Ear/Nose/Mouth/Throat The patient has no complaints or symptoms. Hematologic/Lymphatic The patient has no complaints or  symptoms. Respiratory Complains or has symptoms of Shortness of Breath. Cardiovascular Complains or has symptoms of LE edema. Denies complaints or symptoms of Chest pain. Gastrointestinal The patient has no complaints or symptoms. Endocrine The patient has no complaints or symptoms. Genitourinary The patient has no complaints or symptoms. Immunological The patient has no complaints or symptoms. Integumentary (Skin) Complains or has symptoms of Wounds. Denies complaints or symptoms of Bleeding or bruising tendency, Breakdown, Swelling. Musculoskeletal The patient has no complaints or symptoms. Neurologic The patient has no complaints or symptoms. Oncologic The patient has no complaints or symptoms. Psychiatric The patient has no complaints or symptoms. Objective Constitutional Sitting or standing Blood Pressure is within target range for patient.. Pulse regular and within target range Messerschmidt, Kaleb D. ([id number]) for patient.[name] Respirations regular, non-labored and within target range.. Temperature is normal and  within the target range for the patient.. Patient is not in any distress, morbid obesity noted. Vitals Time Taken: 9:49 AM, Height: 67 in, Weight: 338 lbs, BMI: 52.9, Temperature: 98.0 F, Pulse: 81 bpm, Respiratory Rate: 16 breaths/min, Blood Pressure: 173/73 mmHg. Eyes Conjunctivae clear. No discharge.[name] Respiratory Respiratory effort is easy and symmetric bilaterally. Rate is normal at rest and on room air.. Bilateral breath sounds are clear and equal in all lobes with no wheezes, rales or rhonchi.. Cardiovascular Heart rhythm and rate regular, without murmur or gallop.. I had difficulty feeling her femoral artery although I think this was probably more to do with clothing and soft tissue. Pedal pulses palpable on the left. Gastrointestinal (GI) Obese but no masses. Lymphatic Nonpalpable in the left popliteal or inguinal area. Patient is tender in the left popliteal states because of a Baker's cyst. Neurological Notable for reduced vibration sense. Psychiatric No evidence of depression, anxiety, or agitation. Calm, cooperative, and communicative. Appropriate interactions and affect.. General Notes: Wound exam; significant erythema on the anterior lower left leg and lateral left leg extending into the lateral part of her foot. She has open areas on the lower lateral left leg with the largest being the size of a quarter. No debridement was needed for the wounds. There is significant erythema and warmth but no real palpable tenderness. On the lateral aspect of the left leg the erythematous well demarcated. I do not believe that this patient has recurrent cellulitis Integumentary (Hair, Skin) Left leg significant erythema which is well demarcated. We looked back on pictures from her previous stay in this clinic this area was similarly affected.. Wound #2 status is Open. Original cause of wound was Blister. The wound is located on the Left,Distal,Lateral Lower Leg. The wound measures 2cm  length x 2.5cm width x 0.1cm depth; 3.927cm^2 area and 0.393cm^3 volume. There is Fat Layer (Subcutaneous Tissue) Exposed exposed. There is no tunneling or undermining noted. There is a large amount of serous drainage noted. The wound margin is flat and intact. There is small (1-33%) red granulation within the wound bed. There is a large (67-100%) amount of necrotic tissue within the wound bed including Adherent Slough. The periwound skin appearance exhibited: Excoriation. The periwound skin appearance did not exhibit: Callus, Crepitus, Induration, Rash, Scarring, Dry/Scaly, Maceration, Atrophie Blanche, Cyanosis, Ecchymosis, Hemosiderin Staining, Mottled, Veselka, Julianah D. ([id number]) Pallor, Rubor, Erythema. Periwound temperature was noted as No Abnormality. The periwound has tenderness on palpation. Wound #3 status is Open. Original cause of wound was Blister. The wound is located on the Left,Medial Lower Leg. The wound measures 1.5cm length x 2.2cm width x 0.1cm depth; 2.592cm^2 area and 0.259cm^3  volume. The wound is limited to skin breakdown. There is no tunneling or undermining noted. There is a large amount of serous drainage noted. The wound margin is indistinct and nonvisible. There is no granulation within the wound bed. There is no necrotic tissue within the wound bed. Periwound temperature was noted as No Abnormality. The periwound has tenderness on palpation. General Notes: Areas are oozing fluid Wound #4 status is Open. Original cause of wound was Blister. The wound is located on the Left,Lateral Lower Leg. The wound measures 2.5cm length x 2.5cm width x 0.1cm depth; 4.909cm^2 area and 0.491cm^3 volume. The wound is limited to skin breakdown. There is no tunneling or undermining noted. There is a none present amount of drainage noted. The wound margin is flat and intact. There is no granulation within the wound bed. There is a large (67-100%) amount of necrotic tissue within the  wound bed including Eschar. The periwound skin appearance exhibited: Erythema. The surrounding wound skin color is noted with erythema which is circumferential. Wound #5 status is Open. Original cause of wound was Blister. The wound is located on the Left,Lateral Lower Leg. The wound measures 2cm length x 3.2cm width x 0.1cm depth; 5.027cm^2 area and 0.503cm^3 volume. The wound is limited to skin breakdown. There is no tunneling or undermining noted. There is a large amount of serous drainage noted. The wound margin is indistinct and nonvisible. There is no granulation within the wound bed. There is no necrotic tissue within the wound bed. The periwound skin appearance exhibited: Excoriation. 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 Wound #3 Left,Medial Lower Leg: Hoen, Jhoselyn D. ([id number]) Clean wound with Normal Saline. Wound #4 Left,Lateral Lower Leg: Clean wound with Normal Saline. Wound #5 Left,Lateral Lower Leg: Clean wound with Normal Saline. Anesthetic: Wound #2 Left,Distal,Lateral Lower Leg: Topical Lidocaine 4% cream applied to wound bed prior to debridement Wound #3 Left,Medial Lower Leg: Topical Lidocaine 4% cream applied to wound bed prior to debridement Wound #4 Left,Lateral Lower Leg: Topical Lidocaine 4% cream applied to wound bed prior to debridement Wound #5 Left,Lateral Lower Leg: Topical Lidocaine 4% cream applied to wound bed prior to debridement Skin Barriers/Peri-Wound Care: Wound #2 Left,Distal,Lateral Lower Leg: Triamcinolone Acetonide Ointment Wound #3 Left,Medial Lower  Leg: Triamcinolone Acetonide Ointment Wound #4 Left,Lateral Lower Leg: Triamcinolone Acetonide Ointment Wound #5 Left,Lateral Lower Leg: Triamcinolone Acetonide Ointment Primary Wound Dressing: Wound #2 Left,Distal,Lateral Lower Leg: Aquacel Ag Wound #3 Left,Medial Lower Leg: Aquacel Ag Wound #4 Left,Lateral Lower Leg: Aquacel Ag Wound #5 Left,Lateral Lower Leg: Aquacel Ag Secondary Dressing: Wound #2 Left,Distal,Lateral Lower Leg: ABD pad Wound #3 Left,Medial Lower Leg: ABD pad Wound #4 Left,Lateral Lower Leg: ABD pad Wound #5 Left,Lateral Lower Leg: ABD pad Dressing Change Frequency: Wound #2 Left,Distal,Lateral Lower Leg: Change dressing every week Other: - Nurse Visit as needed Wound #3 Left,Medial Lower Leg: Change dressing every week Other: - Nurse Visit as needed Wound #4 Left,Lateral Lower Leg: Change dressing every week Other: - Nurse Visit as needed Griner, Kiyomi D. ([id number]) Wound #5 Left,Lateral Lower Leg: Change dressing every week Other: - Nurse Visit as needed Follow-up Appointments: Wound #2 Left,Distal,Lateral Lower Leg: Return Appointment in  1 week. Wound #3 Left,Medial Lower Leg: Return Appointment in 1 week. Wound #4 Left,Lateral Lower Leg: Return Appointment in 1 week. Wound #5 Left,Lateral Lower Leg: Return Appointment in 1 week. Edema Control: Wound #2 Left,Distal,Lateral Lower Leg: 3 Layer Compression System - Left Lower Extremity Wound #3 Left,Medial Lower Leg: 3 Layer Compression System - Left Lower Extremity Wound #4 Left,Lateral Lower Leg: 3 Layer Compression System - Left Lower Extremity Wound #5 Left,Lateral Lower Leg: 3 Layer Compression System - Left Lower Extremity o #1 I do not believe this patient has current cellulitis. I think this is chronic venous insufficiency with severe left leg venous inflammation. There is swelling of the right leg but again I don't believe there is evidence of an active DVT. [facility] had for DVT studies from 2009 through 2015 all of these were negative. #2 as such I don't think antibiotics are currently indicated. I think the patient needs compression. She tells me that she tolerates compression poorly because of discomfort. Because of this I have gone with a 3 layer wrap rather than a 4. At the end of this she stated she was comfortable. #3 silver alginate to all wounds #4 I would like to get vein and vascular's records on this patient for our system. She apparently has had prior ablations bilaterally. #5 Because of the above I don't currently think the patinet requires antibiotics either for treatment or prophylaxis Arroyave, ELENOR ECKERLE ([id number]) Electronic Signature(s) Signed: 01/09/2017 4:28:51 PM By: Linton Ham MD Entered By: Linton Ham on 01/08/2017 12:19:47 Ducharme, Mitchell D. ([id number]) -------------------------------------------------------------------------------- ROS/PFSH Details Patient Name: Tillery, Kamariya D. Date of Service: 01/08/2017 9:45 AM Medical Record Patient Account Number: [id number] Number: Treating RN: Cornell Barman Aug 05, 1951 [id number] y.o. Other Clinician: Date of Birth/Sex: Female) Treating Evanne Matsunaga, [place] Primary Care Provider: Webb Silversmith Provider/Extender: G Referring Provider: Webb Silversmith Weeks in Treatment: 0 Information Obtained From Patient Chart Wound History Do you currently have one or more open woundso Yes How many open wounds do you currently haveo 8 Approximately how long have you had your woundso 2 months How have you been treating your wound(s) until nowo siler sulfadine Has your wound(s) ever healed and then re-openedo Yes Have you had any lab work done in the past montho No Have you tested positive for an antibiotic resistant organism (MRSA, VRE)o Yes Have you tested positive for osteomyelitis (bone infection)o No Have you had any tests for circulation on your legso Yes Have you had other  problems associated with your woundso Infection, Swelling Respiratory Complaints and Symptoms: Positive for: Shortness of Breath Medical History: Negative for: Aspiration; Asthma; Chronic Obstructive Pulmonary Disease (COPD); Pneumothorax; Sleep Apnea; Tuberculosis Cardiovascular Complaints and Symptoms: Positive for: LE edema Negative for: Chest pain Medical History: Positive for: Congestive Heart Failure; Coronary Artery Disease; Hypertension Negative for: Angina; Arrhythmia; Deep Vein Thrombosis; Hypotension; Myocardial Infarction; Peripheral Arterial Disease; Peripheral Venous Disease; Phlebitis; Vasculitis Past Medical History Notes: stent due to a blockage Integumentary (Skin) Complaints and Symptoms: Positive for: Wounds Kovich, Colleen D. ([id number]) Negative for: Bleeding or bruising tendency; Breakdown; Swelling Medical History: Negative for: History of Burn; History of pressure wounds Constitutional Symptoms (General Health) Complaints and Symptoms: No Complaints or Symptoms Medical History: Past Medical History Notes: CHF, Spinal Stenosis, RA, DM, fibromyalgia, CAD Eyes Complaints and Symptoms: No Complaints or Symptoms Medical History: Negative for: Cataracts; Glaucoma; Optic Neuritis Ear/Nose/Mouth/Throat Complaints and Symptoms: No Complaints or Symptoms Medical History: Negative for: Chronic sinus problems/congestion; Middle ear  problems Hematologic/Lymphatic Complaints and Symptoms: No Complaints or Symptoms Medical History: Positive for: Lymphedema Negative for: Anemia; Hemophilia; Human Immunodeficiency Virus; Sickle Cell Disease Gastrointestinal Complaints and Symptoms: No Complaints or Symptoms Medical History: Negative for: Cirrhosis ; Colitis; Crohnos; Hepatitis A; Hepatitis B; Hepatitis C Past Medical History Notes: peptic ulcers Endocrine Foland, Oceana D. ([id number]) Complaints and Symptoms: No Complaints or Symptoms Medical  History: Positive for: Type II Diabetes - 01/2016 6.5 Negative for: Type I Diabetes Time with diabetes: 5+ Treated with: Oral agents Blood sugar tested every day: Yes Tested : Blood sugar testing results: Breakfast: 123 Genitourinary Complaints and Symptoms: No Complaints or Symptoms Medical History: Negative for: End Stage Renal Disease Immunological Complaints and Symptoms: No Complaints or Symptoms Medical History: Negative for: Lupus Erythematosus; Raynaudos; Scleroderma Musculoskeletal Complaints and Symptoms: No Complaints or Symptoms Medical History: Positive for: Rheumatoid Arthritis; Osteoarthritis Negative for: Gout; Osteomyelitis Past Medical History Notes: 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 ([id number]) Medical History: Negative for: Received Chemotherapy; Received Radiation Psychiatric Complaints and Symptoms: No Complaints or Symptoms Medical History: Positive for: Confinement Anxiety Negative for: Anorexia/bulimia Immunizations Pneumococcal Vaccine: Received Pneumococcal Vaccination: Yes Hospitalization / Surgery History Name of Hospital Purpose of Hospitalization/Surgery Date [facility] cellulitis 08/30/2014 Loop hernia 09/02/2016 Family and Social History Cancer: Yes - Siblings; Diabetes: No; Heart Disease: Yes - Father; Hereditary Spherocytosis: No; Hypertension: Yes - Mother, Father; Kidney Disease: No; Lung Disease: Yes - Father; Seizures: No; Stroke: No; Thyroid Problems: Yes - Mother; Tuberculosis: No; Never smoker; Marital Status - Married; Alcohol Use: Never; Drug Use: No History; Caffeine Use: Daily - coffee; Financial Concerns: No; Food, Clothing or Shelter Needs: No; Support System Lacking: No; Transportation Concerns:  No; Advanced Directives: No; Patient does not want information on Advanced Directives; Living Will: No; Medical Power of Attorney: No Electronic Signature(s) Signed: 01/08/2017 5:09:04 PM By: Gretta Cool RN, BSN, Kim RN, BSN Signed: 01/09/2017 4:28:51 PM By: Linton Ham MD Entered By: Gretta Cool RN, BSN, Kim on 01/08/2017 10:34:11 Sabic, Danyell D. ([id number]) -------------------------------------------------------------------------------- SuperBill Details Patient Name: Calix, Aiman D. Date of Service: 01/08/2017 Medical Record Patient Account Number: [id number] Number: Treating RN: Cornell Barman [birth year] [id number] y.o. 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 E11.622 Type 2 diabetes mellitus with other skin ulcer Facility Procedures CPT4 Code: [id number] Description: 99213 - WOUND CARE VISIT-LEV 3 EST PT Modifier: Quantity: 1 Physician Procedures CPT4: Description Modifier Quantity Code [id number] - WC PHYS LEVEL 5 - EST PT 1 ICD-10 Description Diagnosis I87.332 Chronic venous hypertension (idiopathic) with ulcer and inflammation of left lower extremity L97.221 Non-pressure chronic ulcer of  left calf limited to breakdown of skin Electronic Signature(s) Signed: 01/08/2017 12:55:29 PM By: Montey Hora Signed: 01/09/2017 4:28:51 PM By: Linton Ham MD Entered By: Montey Hora on 01/08/2017 12:55:29

## 2017-01-10 NOTE — Progress Notes (Signed)
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 Extremity Notes TCA cream Electronic Signature(s) Signed: 01/08/2017 5:09:04 PM By: Gretta Cool, RN, BSN, Kim RN, BSN Entered By: Gretta Cool, RN, BSN, Kim on 01/08/2017 10:09:45 Lessley, Jezelle D. ([id number]) -------------------------------------------------------------------------------- Wound Assessment Details Patient Name: Riesen, Talayia D. Date of Service: 01/08/2017 9:45 AM Medical Record Number: [id number] Patient Account Number: [id number] Date of Birth/Sex: Dec 27, 1950 (66 y.o. Female) Treating RN: Cornell Barman Primary Care Maleiyah Releford: Webb Silversmith Other Clinician: Referring Ellyanna Holton: Webb Silversmith Treating Egidio Lofgren/Extender: Frann Rider in Treatment: 0 Wound Status Wound Number: 5 Primary Etiology: Venous Leg Ulcer Wound Location: Left Lower Leg - Lateral Wound Status: Open Wounding Event: Blister Date Acquired: 11/05/2016 Weeks Of Treatment: 0 Clustered Wound: Yes Photos Wound Measurements Length: (cm) 2 % Reduction in A Width: (cm) 3.2 % Reduction in V Depth: (cm) 0.1 Epithelializatio Clustered Quantity: 3 Tunneling: Area: (cm) 5.027 Undermining: Volume: (cm) 0.503 rea: olume: n: Small (1-33%) No No Wound  Description Classification: Partial Thickness Foul Odor After Wound Margin: Indistinct, nonvisible Slough/Fibrino Exudate Amount: Large Exudate Type: Serous Exudate Color: amber Cleansing: No No Wound Bed Granulation Amount: None Present (0%) Exposed Structure Necrotic Amount: None Present (0%) Fascia Exposed: No Fat Layer (Subcutaneous Tissue) Exposed: No Tendon Exposed: No Muscle Exposed: No Joint Exposed: No Rayos, Anneliese D. ([id number]) Bone Exposed: No Limited to Skin Breakdown Periwound Skin Texture Texture Color No Abnormalities Noted: No No Abnormalities Noted: No Excoriation: Yes Moisture No Abnormalities Noted: No Wound Preparation Ulcer Cleansing: Rinsed/Irrigated with Saline Topical Anesthetic Applied: Other: lidocaine 4%, Treatment Notes Wound #5 (Left, Lateral Lower Leg) 1. Cleansed with: Clean wound with Normal Saline 2. Anesthetic Topical Lidocaine 4% cream to wound bed prior to debridement 3. Peri-wound Care: Other peri-wound care (specify in notes) 4. Dressing Applied: Aquacel Ag 5. Secondary Dressing Applied ABD Pad 7. Secured with 3 Layer Compression System -  Right Lower Extremity Notes TCA cream Electronic Signature(s) Signed: 01/08/2017 5:09:04 PM By: Gretta Cool, RN, BSN, Kim RN, BSN Entered By: Gretta Cool, RN, BSN, Kim on 01/08/2017 10:11:49 Krantz, Hardesty. ([id number]) -------------------------------------------------------------------------------- [place] Details Patient Name: Lupien, Tempest D. Date of Service: 01/08/2017 9:45 AM Medical Record Patient Account Number: [id number] Number: Treating RN: Cornell Barman 1951-08-29 [id number] y.o. Other Clinician: Date of Birth/Sex: Female) Treating ROBSON, MICHAEL Primary Care Kaleigh Spiegelman: Webb Silversmith Bronnie Vasseur/Extender: G Referring Kenyata Guess: Webb Silversmith Weeks in Treatment: 0 Vital Signs Time Taken: 09:49 Temperature (F): 98.0 Height (in): 67 Pulse (bpm): 81 Weight (lbs): 338 Respiratory Rate  (breaths/min): 16 Body Mass Index (BMI): 52.9 Blood Pressure (mmHg): 173/73 Reference Range: 80 - 120 mg / dl Electronic Signature(s) Signed: 01/08/2017 5:09:04 PM By: Gretta Cool, RN, BSN, Kim RN, BSN Entered By: Gretta Cool, RN, BSN, Kim on 01/08/2017 10:33:44

## 2017-01-11 DIAGNOSIS — E11622 Type 2 diabetes mellitus with other skin ulcer: Secondary | ICD-10-CM | POA: Diagnosis not present

## 2017-01-12 NOTE — Progress Notes (Addendum)
Rosekrans, MEEYA PACCIONE ([id number]) Visit Report for 01/11/2017 Arrival Information Details Patient Name: Smullen, Claude D. Date of Service: 01/11/2017 1:30 PM Medical Record Patient Account Number: [id number] Number: Afful, RN, BSN, Treating RN: 1951-11-18 [phone] y.o. Velva Harman Date of Birth/Sex: Female) Other Clinician: Primary Care Gabriella Guile: Webb Silversmith Treating Britto, Errol Referring Perina Salvaggio: Webb Silversmith Saray Capasso/Extender: Suella Grove in Treatment: 0 Visit Information History Since Last Visit All ordered tests and consults were completed: No Patient Arrived: Cane Added or deleted any medications: No Arrival Time: 13:30 Any new allergies or adverse reactions: No Accompanied By: self Had a fall or experienced change in No Transfer Assistance: None activities of daily living that may affect Patient Identification Verified: Yes risk of falls: Secondary Verification Process Yes Signs or symptoms of abuse/neglect since last No Completed: visito Patient Has Alerts: Yes Hospitalized since last visit: No Patient Alerts: Type II Has Dressing in Place as Prescribed: Yes Diabetic Has Compression in Place as Prescribed: Yes Pain Present Now: Yes Electronic Signature(s) Signed: 01/11/2017 1:43:27 PM By: Regan Lemming BSN, RN Entered By: Regan Lemming on 01/11/2017 13:43:27 Landen, Keyli D. ([id number]) -------------------------------------------------------------------------------- Encounter Discharge Information Details Patient Name: Bouch, Marg D. Date of Service: 01/11/2017 1:30 PM Medical Record Patient Account Number: [id number] Number: Afful, RN, BSN, Treating RN: 01-21-1951 [phone] y.o. Velva Harman Date of Birth/Sex: Female) Other Clinician: Primary Care Antonis Lor: Webb Silversmith Treating Britto, Errol Referring Eveny Anastas: Webb Silversmith Laiba Fuerte/Extender: Suella Grove in Treatment: 0 Encounter Discharge Information Items Discharge Pain Level: 0 Discharge Condition: Stable Ambulatory Status:  Cane Discharge Destination: Home Private Transportation: Auto Accompanied By: self Schedule Follow-up Appointment: No Medication Reconciliation completed and No provided to Patient/Care Laveyah Oriol: Clinical Summary of Care: Electronic Signature(s) Signed: 01/11/2017 1:51:20 PM By: Regan Lemming BSN, RN Entered By: Regan Lemming on 01/11/2017 13:51:20 Higashi, Marylon D. ([id number]) -------------------------------------------------------------------------------- Patient/Caregiver Education Details Patient Name: Mahar, Loucile D. Date of Service: 01/11/2017 1:30 PM Medical Record Patient Account Number: [id number] Number: Afful, RN, BSN, Treating RN: 1951-09-05 [phone] y.o. Velva Harman Date of Birth/Gender: Female) Other Clinician: Primary Care Physician: Webb Silversmith Treating Britto, Errol Referring Physician: Webb Silversmith Physician/Extender: Suella Grove in Treatment: 0 Education Assessment Education Provided To: Patient Education Topics Provided Welcome To [facility]: Methods: Explain/Verbal Responses: State content correctly Electronic Signature(s) Signed: 01/11/2017 3:41:55 PM By: Regan Lemming BSN, RN Entered By: Regan Lemming on 01/11/2017 13:48:34 Swearengin, Reyne D. ([id number]) -------------------------------------------------------------------------------- Wound Assessment Details Patient Name: Stivers, Shakita D. Date of Service: 01/11/2017 1:30 PM Medical Record Patient Account Number: [id number] Number: Afful, RN, BSN, Treating RN: Jun 18, 1951 [phone] y.o. Velva Harman Date of Birth/Sex: Female) Other Clinician: Primary Care Amon Costilla: Webb Silversmith Treating Britto, Errol Referring Paloma Grange: Webb Silversmith Bernardo Brayman/Extender: Weeks in Treatment: 0 Wound Status Wound Number: 2 Primary Venous Leg Ulcer Etiology: Wound Location: Left Lower Leg - Lateral, Distal Wound Open Wounding Event: Blister Status: Date Acquired: 11/06/2016 Comorbid Lymphedema, Congestive Heart Weeks Of  Treatment: 0 History: Failure, Coronary Artery Disease, Clustered Wound: No Hypertension, Type II Diabetes, Rheumatoid Arthritis, Osteoarthritis, Confinement Anxiety Wound Measurements Length: (cm) 2 Width: (cm) 2.5 Depth: (cm) 0.1 Area: (cm) 3.927 Volume: (cm) 0.393 % Reduction in Area: 0% % Reduction in Volume: 0% Epithelialization: None Tunneling: No Undermining: No Wound Description Full Thickness Without Classification: Exposed Support Structures Diabetic Severity Grade 1 (Wagner): Wound Margin: Flat and Intact Exudate Amount: Large Exudate Type: Serous Exudate Color: amber Foul Odor After Cleansing: No Slough/Fibrino Yes Wound Bed Granulation Amount: Small (1-33%) Exposed Structure Granulation Quality: Red Fascia Exposed: No Necrotic Amount:  Large (67-100%) Fat Layer (Subcutaneous Tissue) Exposed: Yes Necrotic Quality: Adherent Slough Tendon Exposed: No Muscle Exposed: No Joint Exposed: No Bone Exposed: No Periwound Skin Texture Petkus, Nori D. ([id number]) Texture Color No Abnormalities Noted: No No Abnormalities Noted: No Callus: No Atrophie Blanche: No Crepitus: No Cyanosis: No Excoriation: Yes Ecchymosis: No Induration: No Erythema: No Rash: No Hemosiderin Staining: No Scarring: No Mottled: No Pallor: No Moisture Rubor: No No Abnormalities Noted: No Dry / Scaly: No Temperature / Pain Maceration: No Temperature: No Abnormality Tenderness on Palpation: Yes Wound Preparation Ulcer Cleansing: Rinsed/Irrigated with Saline, Other: soap and water, Topical Anesthetic Applied: None Treatment Notes Wound #2 (Left, Distal, Lateral Lower Leg) 1. Cleansed with: Clean wound with Normal Saline Cleanse wound with antibacterial soap and water 3. Peri-wound Care: Other peri-wound care (specify in notes) 4. Dressing Applied: Aquacel Ag 5. Secondary Dressing Applied ABD Pad 7. Secured with 3 Layer Compression System - Right Lower  Extremity Notes TCA cream Electronic Signature(s) Signed: 01/11/2017 1:45:01 PM By: Regan Lemming BSN, RN Entered By: Regan Lemming on 01/11/2017 13:45:01 Olmo, Kao D. ([id number]) -------------------------------------------------------------------------------- Wound Assessment Details Patient Name: Haynie, Delorice D. Date of Service: 01/11/2017 1:30 PM Medical Record Patient Account Number: [id number] Number: Afful, RN, BSN, Treating RN: 10/01/1951 [phone] y.o. Velva Harman Date of Birth/Sex: Female) Other Clinician: Primary Care Elijan Googe: Webb Silversmith Treating Britto, Errol Referring Georgianna Band: Webb Silversmith Odies Desa/Extender: Weeks in Treatment: 0 Wound Status Wound Number: 3 Primary Venous Leg Ulcer Etiology: Wound Location: Left Lower Leg - Medial Wound Open Wounding Event: Blister Status: Date Acquired: 11/06/2016 Comorbid Lymphedema, Congestive Heart Weeks Of Treatment: 0 History: Failure, Coronary Artery Disease, Clustered Wound: Yes Hypertension, Type II Diabetes, Rheumatoid Arthritis, Osteoarthritis, Confinement Anxiety Wound Measurements Length: (cm) 1.5 Width: (cm) 2.2 Depth: (cm) 0.1 Clustered Quantity: 2 Area: (cm) 2.592 Volume: (cm) 0.259 % Reduction in Area: 0% % Reduction in Volume: 0% Epithelialization: Medium (34-66%) Tunneling: No Undermining: No Wound Description Classification: Partial Thickness Diabetic Severity (Wagner): Grade 1 Wound Margin: Indistinct, nonvisible Exudate Amount: Large Exudate Type: Serosanguineous Exudate Color: red, brown Foul Odor After Cleansing: No Slough/Fibrino Yes Wound Bed Granulation Amount: Small (1-33%) Exposed Structure Necrotic Amount: Large (67-100%) Fascia Exposed: No Necrotic Quality: Adherent Slough Fat Layer (Subcutaneous Tissue) Exposed: Yes Tendon Exposed: No Muscle Exposed: No Joint Exposed: No Bone Exposed: No Periwound Skin Texture Texture Color Barrows, Ashni D. ([id number]) No Abnormalities  Noted: No No Abnormalities Noted: No Callus: No Atrophie Blanche: No Crepitus: No Cyanosis: No Excoriation: No Ecchymosis: No Induration: No Erythema: No Rash: No Hemosiderin Staining: No Scarring: No Mottled: No Pallor: No Moisture Rubor: No No Abnormalities Noted: No Dry / Scaly: No Temperature / Pain Maceration: Yes Temperature: No Abnormality Tenderness on Palpation: Yes Wound Preparation Ulcer Cleansing: Rinsed/Irrigated with Saline, Other: soap and water, Treatment Notes Wound #3 (Left, Medial Lower Leg) 1. Cleansed with: Clean wound with Normal Saline Cleanse wound with antibacterial soap and water 3. Peri-wound Care: Other peri-wound care (specify in notes) 4. Dressing Applied: Aquacel Ag 5. Secondary Dressing Applied ABD Pad 7. Secured with 3 Layer Compression System - Right Lower Extremity Notes TCA cream Electronic Signature(s) Signed: 01/11/2017 1:46:02 PM By: Regan Lemming BSN, RN Entered By: Regan Lemming on 01/11/2017 13:46:02 Bond, Tamella D. ([id number]) -------------------------------------------------------------------------------- Wound Assessment Details Patient Name: Buchmann, Dalaya D. Date of Service: 01/11/2017 1:30 PM Medical Record Patient Account Number: [id number] Number: Afful, RN, BSN, Treating RN: [birth year] [phone] y.o. Velva Harman Date of Birth/Sex: Female) Other Clinician: Primary Care Aaro Meyers: Garnette Gunner,  REGINA Treating Britto, Errol Referring Fleming Prill: Webb Silversmith Nahiara Kretzschmar/Extender: Weeks in Treatment: 0 Wound Status Wound Number: 4 Primary Diabetic Wound/Ulcer of the Lower Etiology: Extremity Wound Location: Left Foot - Lateral Wound Open Wounding Event: Blister Status: Date Acquired: 11/06/2016 Comorbid Lymphedema, Congestive Heart Weeks Of Treatment: 0 History: Failure, Coronary Artery Disease, Clustered Wound: Yes Hypertension, Type II Diabetes, Rheumatoid Arthritis, Osteoarthritis, Confinement Anxiety Wound  Measurements Length: (cm) 2.5 Width: (cm) 2.5 Depth: (cm) 0.1 Clustered Quantity: 2 Area: (cm) 4.909 Volume: (cm) 0.491 % Reduction in Area: 0% % Reduction in Volume: 0% Epithelialization: None Tunneling: No Undermining: No Wound Description Classification: Grade 1 Wound Margin: Flat and Intact Exudate Amount: Small Exudate Type: Serosanguineous Exudate Color: red, brown Foul Odor After Cleansing: No Slough/Fibrino No Wound Bed Granulation Amount: None Present (0%) Exposed Structure Necrotic Amount: Large (67-100%) Fascia Exposed: No Necrotic Quality: Eschar Fat Layer (Subcutaneous Tissue) Exposed: Yes Tendon Exposed: No Muscle Exposed: No Joint Exposed: No Bone Exposed: No Periwound Skin Texture Texture Color No Abnormalities Noted: No No Abnormalities Noted: No Madl, Jorge D. ([id number]) Erythema: Yes Moisture Erythema Location: Circumferential No Abnormalities Noted: No Dry / Scaly: No Temperature / Pain Maceration: No Temperature: No Abnormality Tenderness on Palpation: Yes Wound Preparation Ulcer Cleansing: Rinsed/Irrigated with Saline, Other: water and soap, Topical Anesthetic Applied: None Treatment Notes Wound #4 (Left, Lateral Foot) 1. Cleansed with: Clean wound with Normal Saline Cleanse wound with antibacterial soap and water 3. Peri-wound Care: Other peri-wound care (specify in notes) 4. Dressing Applied: Aquacel Ag 5. Secondary Dressing Applied ABD Pad 7. Secured with 3 Layer Compression System - Right Lower Extremity Notes TCA cream Electronic Signature(s) Signed: 01/11/2017 1:46:46 PM By: Regan Lemming BSN, RN Entered By: Regan Lemming on 01/11/2017 13:46:46 Teters, Cariah D. ([id number]) -------------------------------------------------------------------------------- Wound Assessment Details Patient Name: Romanoff, Hanaa D. Date of Service: 01/11/2017 1:30 PM Medical Record Patient Account Number: [id number] Number: Afful, RN,  BSN, Treating RN: 1951-01-23 [phone] y.o. Velva Harman Date of Birth/Sex: Female) Other Clinician: Primary Care Jadden Yim: Webb Silversmith Treating Britto, Errol Referring Cathlene Gardella: Webb Silversmith Mollee Neer/Extender: Weeks in Treatment: 0 Wound Status Wound Number: 5 Primary Venous Leg Ulcer Etiology: Wound Location: Left Lower Leg - Lateral Wound Open Wounding Event: Blister Status: Date Acquired: 11/05/2016 Comorbid Lymphedema, Congestive Heart Weeks Of Treatment: 0 History: Failure, Coronary Artery Disease, Clustered Wound: Yes Hypertension, Type II Diabetes, Rheumatoid Arthritis, Osteoarthritis, Confinement Anxiety Wound Measurements Length: (cm) 2 Width: (cm) 3.2 Depth: (cm) 0.1 Clustered Quantity: 3 Area: (cm) 5.027 Volume: (cm) 0.503 % Reduction in Area: 0% % Reduction in Volume: 0% Epithelialization: Small (1-33%) Tunneling: No Undermining: No Wound Description Classification: Partial Thickness Diabetic Severity (Wagner): Grade 1 Wound Margin: Indistinct, nonvisible Exudate Amount: Large Exudate Type: Serous Exudate Color: amber Foul Odor After Cleansing: No Slough/Fibrino Yes Wound Bed Granulation Amount: Medium (34-66%) Exposed Structure Necrotic Amount: Medium (34-66%) Fascia Exposed: No Fat Layer (Subcutaneous Tissue) Exposed: Yes Tendon Exposed: No Muscle Exposed: No Joint Exposed: No Bone Exposed: No Periwound Skin Texture Texture Color Ruppe, Natarsha D. ([id number]) No Abnormalities Noted: No No Abnormalities Noted: No Callus: No Atrophie Blanche: No Crepitus: No Cyanosis: No Excoriation: No Ecchymosis: No Induration: No Erythema: No Rash: No Hemosiderin Staining: No Scarring: No Mottled: No Pallor: No Moisture Rubor: No No Abnormalities Noted: No Dry / Scaly: No Temperature / Pain Maceration: No Temperature: No Abnormality Tenderness on Palpation: Yes Wound Preparation Ulcer Cleansing: Rinsed/Irrigated with Saline, Other: soap and  water, Topical Anesthetic Applied: None Treatment Notes Wound #5 (Left, Lateral  Lower Leg) 1. Cleansed with: Clean wound with Normal Saline Cleanse wound with antibacterial soap and water 3. Peri-wound Care: Other peri-wound care (specify in notes) 4. Dressing Applied: Aquacel Ag 5. Secondary Dressing Applied ABD Pad 7. Secured with 3 Layer Compression System - Right Lower Extremity Notes TCA cream Electronic Signature(s) Signed: 01/11/2017 1:47:42 PM By: Regan Lemming BSN, RN Entered By: Regan Lemming on 01/11/2017 13:47:42

## 2017-01-14 ENCOUNTER — Ambulatory Visit: Payer: BLUE CROSS/BLUE SHIELD | Admitting: Internal Medicine

## 2017-01-15 ENCOUNTER — Encounter: Payer: BLUE CROSS/BLUE SHIELD | Admitting: Internal Medicine

## 2017-01-15 DIAGNOSIS — E11622 Type 2 diabetes mellitus with other skin ulcer: Secondary | ICD-10-CM | POA: Diagnosis not present

## 2017-01-16 NOTE — Progress Notes (Signed)
Whiting, [name] ([id number]) Visit Report for 01/15/2017 Chief Complaint Document Details Patient Name: [name] [name], [name] Date of Service: 01/15/2017 1:30 PM Medical Record Patient Account Number: [id number] Number: Treating RN: Ahmed Prima 01/30/1951 (66 y.o. Other Clinician: Date of Birth/Sex: Female) Treating ROBSON, MICHAEL Primary Care Provider: Webb Silversmith Provider/Extender: G Referring Provider: Colon Flattery in Treatment: 1 Information Obtained from: Patient Chief Complaint chronic venous hypertension with ulcer formation left lower extremity 01/08/17; patient presents today with ulcerations on the left lateral lower leg Electronic Signature(s) Signed: 01/16/2017 7:47:19 AM By: Linton Ham MD Entered By: Linton Ham on 01/15/2017 14:47:22 Angert, Tasmin D. ([id number]) -------------------------------------------------------------------------------- HPI Details Patient Name: [name], [name] Date of Service: 01/15/2017 1:30 PM Medical Record Patient Account Number: [id number] Number: Treating RN: Ahmed Prima 1951-10-07 (66 y.o. Other Clinician: Date of Birth/Sex: Female) Treating ROBSON, Holladay Primary Care Provider: Webb Silversmith Provider/Extender: G Referring Provider: Webb Silversmith Weeks in Treatment: 1 History of Present Illness Location: left lower extremity Severity: improved since her initial hospitalization but stable since Duration: 6 weeks Context: began after her third treatment of Remicade for rheumatoid arthritis Modifying Factors: morbid obesity, chronic venous hypertension, lymphedema, diabetes mellitus type 2, methotrexate use, Remicade use, Associated Signs and Symptoms: pain and swelling left lower extremity HPI Description: she was seen last week where a 2 layer light compression system was applied. She removed that on Sunday because of itching. She states that she did see her primary care physician who has  prescribed her a stronger diuretic and she will begin that today. When she removed her dressing she applied some Neosporin and some gauze. There remains considerable concern about her ability to keep the wrap in place for a week she has been prescribed lymphedema pumps but she does not use those either. We will encourage her to use those twice a day for an hour each time. These were prescribed to her by Dr. Hinton Lovely READMISSION 01/04/17; this is a patient who is a type II diabetic on oral agents. I note she was seen in this clinic 2-3 years ago. I was not involved in her care at that point. She tells [name] that she is had ulcers on her lateral left lower extremity since just before Christmas. There was no obvious precipitant to this. She apparently has been on long-term suppressive penicillin prescribed by Dr. 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 She has noted increasing erythema along with all of this and I think this is what prompted her to come in today. She does not have a known history of PAD or neuropathy hemoglobin A1c in this clinic was 1.06 on the left. The patient has not been requiring any compression on her legs although she does have compression stockings at home as well as external compression pumps. She has been using neither of these. She has not been systemically unwell the patient is also known to the local vein [facility] vascular group. Apparently her external compression pumps were previously prescribed by Dr. Delana Meyer. She has had apparent bilateral ablations done in the past although I have none of these records. 01/15/17; the patient returns for rewrap of her left leg last week and reports that was put on too tight, she had to remove it on Sunday. She states this actually occurred her even though the previous 3 lateral wrap she was able to tolerate well. I don't think  there  is been much in the way of expansion of the erythema on the lateral left leg lateral left heel and lateral left foot. As mentioned previously I think this is chronic venous insufficiency and not really an active cellulitis. She has 2 small open areas in the lower left calf Gaba, [name] ([id number]) Electronic Signature(s) Signed: 01/16/2017 7:47:19 AM By: Linton Ham MD Entered By: Linton Ham on 01/15/2017 14:49:03 Bosler, Kiri D. ([id number]) -------------------------------------------------------------------------------- Physical Exam Details Patient Name: [name], [name] Date of Service: 01/15/2017 1:30 PM Medical Record Patient Account Number: [id number] Number: Treating RN: Ahmed Prima 02-Dec-1951 (66 y.o. Other Clinician: Date of Birth/Sex: Female) Treating ROBSON, MICHAEL Primary Care Provider: Webb Silversmith Provider/Extender: G Referring Provider: Webb Silversmith Weeks in Treatment: 1 Constitutional Patient is hypertensive.. Pulse regular and within target range for patient.[name] Respirations regular, non-labored and within target range.. Temperature is normal and within the target range for the patient.. Notes Wound exam; significant erythema on the anterior left leg and lateral left leg extending into the lateral heel and the lateral part of the left foot. This is not much different from last week. There is no significant tenderness. No palpable warmth. She has small open areas on the lower lateral left leg, the largest areas about the size of a quarter. Electronic Signature(s) Signed: 01/16/2017 7:47:19 AM By: Linton Ham MD Entered By: Linton Ham on 01/15/2017 14:50:25 Mandella, Kanyla D. ([id number]) -------------------------------------------------------------------------------- Physician Orders Details Patient Name: [name], [name] Date of Service: 01/15/2017 1:30 PM Medical Record Patient Account Number: [id number] Number: Treating  RN: Ahmed Prima 06/06/1951 (66 y.o. Other Clinician: Date of Birth/Sex: Female) Treating ROBSON, MICHAEL Primary Care Provider: Webb Silversmith Provider/Extender: G Referring Provider: Colon Flattery in Treatment: 1 Verbal / Phone Orders: Yes Clinician: Pinkerton, Debi Read Back and Verified: Yes Diagnosis Coding Wound Cleansing Wound #2 Left,Distal,Lateral Lower Leg o Clean wound with Normal Saline. Wound #3 Left,Medial Lower Leg o Clean wound with Normal Saline. Wound #4 Left,Lateral Foot o Clean wound with Normal Saline. Wound #5 Left,Lateral Lower Leg o Clean wound with Normal Saline. Anesthetic Wound #2 Left,Distal,Lateral Lower Leg o Topical Lidocaine 4% cream applied to wound bed prior to debridement Wound #3 Left,Medial Lower Leg o Topical Lidocaine 4% cream applied to wound bed prior to debridement Wound #4 Left,Lateral Foot o Topical Lidocaine 4% cream applied to wound bed prior to debridement Wound #5 Left,Lateral Lower Leg o Topical Lidocaine 4% cream applied to wound bed prior to debridement Skin Barriers/Peri-Wound Care Wound #2 Left,Distal,Lateral Lower Leg o Triamcinolone Acetonide Ointment Wound #3 Left,Medial Lower Leg o Triamcinolone Acetonide Ointment Wound #4 Left,Lateral Foot o Triamcinolone Acetonide Ointment Desaulniers, Aspen D. ([id number]) Wound #5 Left,Lateral Lower Leg o Triamcinolone Acetonide Ointment Primary Wound Dressing Wound #2 Left,Distal,Lateral Lower Leg o Prisma Ag Wound #3 Left,Medial Lower Leg o Prisma Ag Wound #4 Left,Lateral Foot o Prisma Ag Wound #5 Left,Lateral Lower Leg o Prisma Ag Secondary Dressing Wound #2 Left,Distal,Lateral Lower Leg o ABD pad o XtraSorb Wound #3 Left,Medial Lower Leg o ABD pad o XtraSorb Wound #4 Left,Lateral Foot o ABD pad o XtraSorb Wound #5 Left,Lateral Lower Leg o ABD pad o XtraSorb Dressing Change Frequency Wound #2  Left,Distal,Lateral Lower Leg o Change dressing every week o Other: - Nurse Visit as needed Wound #3 Left,Medial Lower Leg o Change dressing every week o Other: - Nurse Visit as needed Wound #4 Left,Lateral Foot o Change dressing every week o Other: - Nurse Visit as needed Wound #  5 Left,Lateral Lower Leg Teigen, Miyuki D. ([id number]) o Change dressing every week o Other: - Nurse Visit as needed Follow-up Appointments Wound #2 Left,Distal,Lateral Lower Leg o Return Appointment in 1 week. Wound #3 Left,Medial Lower Leg o Return Appointment in 1 week. Wound #4 Left,Lateral Foot o Return Appointment in 1 week. Wound #5 Left,Lateral Lower Leg o Return Appointment in 1 week. Edema Control Wound #2 Left,Distal,Lateral Lower Leg o 3 Layer Compression System - Left Lower Extremity Wound #3 Left,Medial Lower Leg o 3 Layer Compression System - Left Lower Extremity Wound #4 Left,Lateral Foot o 3 Layer Compression System - Left Lower Extremity Wound #5 Left,Lateral Lower Leg o 3 Layer Compression System - Left Lower Extremity Electronic Signature(s) Signed: 01/15/2017 4:37:07 PM By: Alric Quan Signed: 01/16/2017 7:47:19 AM By: Linton Ham MD Entered By: Alric Quan on 01/15/2017 14:49:01 Baldridge, Kierstan D. ([id number]) -------------------------------------------------------------------------------- Problem List Details Patient Name: Hass, Kiamesha D. Date of Service: 01/15/2017 1:30 PM Medical Record Patient Account Number: [id number] Number: Treating RN: Ahmed Prima 11-01-1951 (65 y.o. 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 ([id number]) -------------------------------------------------------------------------------- Progress Note Details Patient Name: Fenlon, Lindell D. Date of Service: 01/15/2017 1:30 PM Medical Record Patient Account Number: [id number] Number: Treating RN: Ahmed Prima 06-22-1951 (65 y.o. Other Clinician: Date of Birth/Sex: Female) Treating ROBSON, MICHAEL Primary Care Provider: Webb Silversmith Provider/Extender: G Referring Provider: Colon Flattery in Treatment: 1 Subjective Chief Complaint Information obtained from Patient chronic venous hypertension with ulcer formation left lower extremity 01/08/17; patient presents today with ulcerations on the left lateral lower leg History of Present Illness (HPI) The following HPI elements were documented for the patient's wound: Location: left lower extremity Severity: improved since her initial hospitalization but stable since Duration: 6 weeks Context: began after her third treatment of Remicade for rheumatoid arthritis Modifying Factors: morbid obesity, chronic venous hypertension, lymphedema, diabetes mellitus type 2, methotrexate use, Remicade use, Associated Signs and Symptoms: pain and swelling left lower extremity she was seen last week where a 2 layer light compression system was applied. She removed that on Sunday because of itching. She states that she did see her primary care physician who has prescribed her a stronger diuretic and she will begin that today. When she removed her dressing  she applied some Neosporin and some gauze. There remains considerable concern about her ability to keep the wrap in place for a week she has been prescribed lymphedema pumps but she does not use those either. We will encourage her to use those twice a day for an hour each time. These were prescribed to her by Dr. Hinton Lovely READMISSION 01/04/17; this is a patient who is a type II diabetic on oral agents. I note she was seen in this clinic 2-3 years ago. I was not involved in her care at that point. She tells [name] that she is had ulcers on her lateral left lower extremity since  just before Christmas. There was no obvious precipitant to this. She apparently has been on long-term suppressive penicillin prescribed by Dr. 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 She has noted increasing erythema along with all of this and I think this is what prompted her to come in today. She does not have a known history of PAD or neuropathy hemoglobin A1c in this clinic was 1.06 on the left. The patient has not been requiring any compression on her legs although she does have compression stockings at home as well as external compression pumps. She has been using neither of these. She has not been systemically unwell the patient is also known to the local vein and vascular group. Apparently her external compression pumps Navarra, Hattie D. ([id number]) were previously prescribed by Dr. Delana Meyer. She has had apparent bilateral ablations done in the past although I have none of these records. 01/15/17; the patient returns for rewrap of her left leg last week and reports that was put on too tight, she had to remove it on Sunday. She states this actually occurred her even though the previous 3 lateral wrap she was able to tolerate well. I don't think there is been much in the way of expansion of the erythema on  the lateral left leg lateral left heel and lateral left foot. As mentioned previously I think this is chronic venous insufficiency and not really an active cellulitis. She has 2 small open areas in the lower left calf Objective Constitutional Patient is hypertensive.. Pulse regular and within target range for patient.[name] Respirations regular, non-labored and within target range.. Temperature is normal and within the target range for the patient.. Vitals Time Taken: 2:08 PM, Height: 67 in, Weight: 338 lbs, BMI: 52.9, Temperature: 98.0 F, Pulse: 75 bpm, Respiratory Rate: 18 breaths/min, Blood Pressure: 166/81 mmHg. General Notes: Wound exam; significant erythema on the anterior left leg and lateral left leg extending into the lateral heel and the lateral part of the left foot. This is not much different from last week. There is no significant tenderness. No palpable warmth. She has small open areas on the lower lateral left leg, the largest areas about the size of a quarter. Integumentary (Hair, Skin) Wound #2 status is Open. Original cause of wound was Blister. The wound is located on the Left,Distal,Lateral Lower Leg. The wound measures 2cm length x 1.6cm width x 0.1cm depth; 2.513cm^2 area and 0.251cm^3 volume. There is Fat Layer (Subcutaneous Tissue) Exposed exposed. There is no tunneling or undermining noted. There is a large amount of serous drainage noted. The wound margin is flat and intact. There is no granulation within the wound bed. There is a large (67-100%) amount of necrotic tissue within the wound bed including Adherent Slough. The periwound skin appearance exhibited: Excoriation. The periwound skin appearance did not exhibit: Callus, Crepitus, Induration, Rash, Scarring, Dry/Scaly, Maceration, Atrophie Blanche, Cyanosis, Ecchymosis, Hemosiderin Staining, Mottled, Pallor, Rubor, Erythema. Periwound temperature was noted as No Abnormality. The periwound has tenderness  on palpation. Wound #3 status is Open. Original cause of wound was Blister. The wound is located on the Left,Medial Lower Leg. The wound measures 0.7cm length x 0.5cm width x 0.1cm depth; 0.275cm^2 area and 0.027cm^3 volume. There is Fat Layer (Subcutaneous Tissue) Exposed exposed. There is no tunneling or undermining noted. There is a large amount of serosanguineous drainage noted. The wound margin is indistinct and nonvisible.  There is no granulation within the wound bed. There is a large (67-100%) amount of necrotic tissue within the wound bed including Adherent Slough. The periwound skin appearance exhibited: Maceration. The periwound skin appearance did not exhibit: Callus, Crepitus, Excoriation, Induration, Rash, Scarring, Dry/Scaly, Atrophie Blanche, Cyanosis, Ecchymosis, Hemosiderin Staining, Mottled, Pallor, Rubor, Erythema. Periwound temperature was noted as No Abnormality. The periwound Cutrona, Amere D. ([id number]) has tenderness on palpation. Wound #4 status is Open. Original cause of wound was Blister. The wound is located on the Left,Lateral Foot. The wound measures 1cm length x 1cm width x 0.1cm depth; 0.785cm^2 area and 0.079cm^3 volume. There is Fat Layer (Subcutaneous Tissue) Exposed exposed. There is no tunneling or undermining noted. There is a small amount of serosanguineous drainage noted. The wound margin is flat and intact. There is no granulation within the wound bed. There is a large (67-100%) amount of necrotic tissue within the wound bed including Eschar. The periwound skin appearance exhibited: Erythema. The periwound skin appearance did not exhibit: Dry/Scaly, Maceration. The surrounding wound skin color is noted with erythema which is circumferential. Periwound temperature was noted as No Abnormality. The periwound has tenderness on palpation. Wound #5 status is Open. Original cause of wound was Blister. The wound is located on the Left,Lateral Lower Leg. The  wound measures 1cm length x 1cm width x 0.1cm depth; 0.785cm^2 area and 0.079cm^3 volume. There is Fat Layer (Subcutaneous Tissue) Exposed exposed. There is no tunneling or undermining noted. There is a large amount of serous drainage noted. The wound margin is indistinct and nonvisible. There is no granulation within the wound bed. There is a large (67-100%) amount of necrotic tissue within the wound bed including Eschar and Adherent Slough. The periwound skin appearance did not exhibit: Callus, Crepitus, Excoriation, Induration, Rash, Scarring, Dry/Scaly, Maceration, Atrophie Blanche, Cyanosis, Ecchymosis, Hemosiderin Staining, Mottled, Pallor, Rubor, Erythema. Periwound temperature was noted as No Abnormality. The periwound has tenderness on palpation. 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 Wound #3 Left,Medial Lower Leg: Clean wound with Normal Saline. Wound #4 Left,Lateral Foot: Clean wound with Normal Saline. Wound #5 Left,Lateral Lower Leg: Stickles, Aspynn D. ([id number]) Clean wound with Normal Saline. Anesthetic: Wound #2 Left,Distal,Lateral Lower Leg: Topical Lidocaine 4% cream applied to wound bed prior to debridement Wound #3 Left,Medial Lower Leg: Topical Lidocaine 4% cream applied to wound bed prior to debridement Wound #4 Left,Lateral Foot: Topical Lidocaine 4% cream applied to wound bed prior to debridement Wound #5 Left,Lateral Lower Leg: Topical Lidocaine 4% cream applied to wound bed prior to debridement Skin Barriers/Peri-Wound Care: Wound #2 Left,Distal,Lateral Lower  Leg: Triamcinolone Acetonide Ointment Wound #3 Left,Medial Lower Leg: Triamcinolone Acetonide Ointment Wound #4 Left,Lateral Foot: Triamcinolone Acetonide Ointment Wound #5 Left,Lateral Lower Leg: Triamcinolone Acetonide Ointment Primary Wound Dressing: Wound #2 Left,Distal,Lateral Lower Leg: Prisma Ag Wound #3 Left,Medial Lower Leg: Prisma Ag Wound #4 Left,Lateral Foot: Prisma Ag Wound #5 Left,Lateral Lower Leg: Prisma Ag Secondary Dressing: Wound #2 Left,Distal,Lateral Lower Leg: ABD pad XtraSorb Wound #3 Left,Medial Lower Leg: ABD pad XtraSorb Wound #4 Left,Lateral Foot: ABD pad XtraSorb Wound #5 Left,Lateral Lower Leg: ABD pad XtraSorb Dressing Change Frequency: Wound #2 Left,Distal,Lateral Lower Leg: Change dressing  every week Other: - Nurse Visit as needed Wound #3 Left,Medial Lower Leg: Change dressing every week Other: - Nurse Visit as needed Wound #4 Left,Lateral Foot: Change dressing every week Other: - Nurse Visit as needed Berling, Druanne D. ([id number]) Wound #5 Left,Lateral Lower Leg: Change dressing every week Other: - Nurse Visit as needed Follow-up Appointments: Wound #2 Left,Distal,Lateral Lower Leg: Return Appointment in 1 week. Wound #3 Left,Medial Lower Leg: Return Appointment in 1 week. Wound #4 Left,Lateral Foot: Return Appointment in 1 week. Wound #5 Left,Lateral Lower Leg: Return Appointment in 1 week. Edema Control: Wound #2 Left,Distal,Lateral Lower Leg: 3 Layer Compression System - Left Lower Extremity Wound #3 Left,Medial Lower Leg: 3 Layer Compression System - Left Lower Extremity Wound #4 Left,Lateral Foot: 3 Layer Compression System - Left Lower Extremity Wound #5 Left,Lateral Lower Leg: 3 Layer Compression System - Left Lower Extremity liberal tCA prisma aBD continue 3 layer. I still do not believe she has acitve cellullitis Electronic Signature(s) Signed: 01/16/2017 7:47:19 AM By: Linton Ham MD Entered By: Linton Ham on 01/15/2017 14:52:27 Batterman, Arine D. ([id number]) -------------------------------------------------------------------------------- SuperBill Details Patient Name: Pardee, Letica D. Date of Service: 01/15/2017 Medical Record Patient Account Number: [id number] Number: Treating RN: Ahmed Prima 06-06-1951 [id number] y.o. Other Clinician: Date of Birth/Sex: Female) Treating ROBSON, MICHAEL Primary Care Provider: Webb Silversmith Provider/Extender: G Referring Provider: Webb Silversmith Service Line: Outpatient Weeks in Treatment: 1 Diagnosis Coding ICD-10 Codes Code Description Chronic venous hypertension (idiopathic) with ulcer and inflammation of left lower I87.332 extremity L97.221 Non-pressure chronic ulcer of left calf limited to breakdown of skin E11.42 Type 2 diabetes mellitus with diabetic polyneuropathy E11.622 Type 2 diabetes mellitus with other skin ulcer Facility Procedures CPT4: Description Modifier Quantity Code [id number] (Facility Use Only) [phone] - APPLY [place] LT 1 LEG Physician Procedures CPT4: Description Modifier Quantity Code [id number] - WC PHYS LEVEL 3 - EST PT 1 ICD-10 Description Diagnosis I87.332 Chronic venous hypertension (idiopathic) with ulcer and inflammation of left lower extremity L97.221 Non-pressure chronic ulcer of  left calf limited to breakdown of skin Electronic Signature(s) Signed: 01/15/2017 4:37:07 PM By: Alric Quan Signed: 01/16/2017 7:47:19 AM By: Linton Ham MD Entered By: Alric Quan on 01/15/2017 15:07:14

## 2017-01-16 NOTE — Progress Notes (Signed)
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 Abnormalities Noted: No Callus: No Atrophie Blanche: No Crepitus: No Cyanosis: No Excoriation: No Ecchymosis: No Induration: No Erythema: No Rash: No Hemosiderin Staining: No Scarring: No Mottled: No Pallor: No Moisture Rubor: No No Abnormalities Noted: No Dry / Scaly: No Temperature / Pain Maceration: No Temperature: No Abnormality Tenderness on Palpation: Yes Wound Preparation Ulcer Cleansing: Rinsed/Irrigated with Saline, Other: soap and water, Topical Anesthetic Applied: None, Other: lidocaine 4%, Treatment Notes Wound #5 (Left, Lateral Lower Leg) 1. Cleansed with: Clean wound with Normal Saline Cleanse wound with antibacterial soap and water 2. Anesthetic Topical Lidocaine 4% cream to wound bed prior  to debridement 4. Dressing Applied: Prisma Ag 5. Secondary Dressing Applied ABD Pad 7. Secured with Tape 3 Layer Compression System - Left Lower Extremity Notes xtrasorb Electronic Signature(s) Gradilla, Korryn D. ([id number]) Signed: 01/15/2017 4:37:07 PM By: Alric Quan Entered By: Alric Quan on 01/15/2017 14:16:57 Crislip, Angelik D. ([id number]) -------------------------------------------------------------------------------- [place] Details Patient Name: Febres, Bridgette D. Date of Service: 01/15/2017 1:30 PM Medical Record Patient Account Number: [id number] Number: Treating RN: Ahmed Prima Mar 07, 1951 (65 y.o. Other Clinician: Date of Birth/Sex: Female) Treating ROBSON, MICHAEL Primary Care Yanelis Osika: Webb Silversmith Dalaysia Harms/Extender: G Referring Dietra Stokely: Webb Silversmith Weeks in Treatment: 1 Vital Signs Time Taken: 14:08 Temperature (F): 98.0 Height (in): 67 Pulse (bpm): 75 Weight (lbs): 338 Respiratory Rate (breaths/min): 18 Body Mass Index (BMI): 52.9 Blood Pressure (mmHg): 166/81 Reference Range: 80 - 120 mg / dl Electronic Signature(s) Signed: 01/15/2017 4:37:07 PM By: Alric Quan Entered By: Alric Quan on 01/15/2017 14:09:46

## 2017-01-17 ENCOUNTER — Ambulatory Visit (INDEPENDENT_AMBULATORY_CARE_PROVIDER_SITE_OTHER): Payer: BLUE CROSS/BLUE SHIELD | Admitting: Internal Medicine

## 2017-01-17 ENCOUNTER — Encounter: Payer: Self-pay | Admitting: Internal Medicine

## 2017-01-17 VITALS — BP 134/80 | HR 95 | Temp 97.9°F | Wt 354.0 lb

## 2017-01-17 DIAGNOSIS — I10 Essential (primary) hypertension: Secondary | ICD-10-CM

## 2017-01-17 DIAGNOSIS — M1990 Unspecified osteoarthritis, unspecified site: Secondary | ICD-10-CM

## 2017-01-17 MED ORDER — LOSARTAN POTASSIUM 50 MG PO TABS: 50.0000 mg | ORAL_TABLET | Freq: Every day | ORAL | 1 refills | Status: DC

## 2017-01-17 MED ORDER — OXYCODONE-ACETAMINOPHEN 10-325 MG PO TABS: 1.0000 | ORAL_TABLET | Freq: Four times a day (QID) | ORAL | 0 refills | Status: DC | PRN

## 2017-01-17 NOTE — Progress Notes (Signed)
Subjective:    Patient ID: [name] [name], female    DOB: 13-Jun-1951, 66 y.o.   MRN: [id number]  HPI  Pt presents to the clinic today for 1 month follow up of HTN. At her last visit, her BP was 136/84. She was started on Losartan in addition to her Carvedilol. She has been taking the medication as prescribed. Her BP today is 134/80. ECG from 09/2015 reviewed.   She is also requesting a refill of her Percocet today. She is awaiting referral to pain management. [facility] CRS reviewed, no other prescribers.  Review of Systems      Past Medical History:  Diagnosis Date  . Arthritis   . Cellulitis of both lower extremities    Chronic  . Coronary artery disease   . Diabetes mellitus without complication ([place])   . Fibromyalgia   . Fibromyalgia affecting shoulder region     Current Outpatient Prescriptions  Medication Sig Dispense Refill  . amoxicillin-clavulanate (AUGMENTIN) 875-125 MG tablet Take 1 tablet by mouth 2 (two) times daily. 28 tablet 0  . aspirin 81 MG tablet Take 1 tablet (81 mg total) by mouth daily. 30 tablet 2  . carvedilol (COREG) 3.125 MG tablet Take 1 tablet (3.125 mg total) by mouth 2 (two) times daily with a meal. 60 tablet 2  . cetirizine (ZYRTEC) 10 MG tablet Take 1 tablet (10 mg total) by mouth daily. 30 tablet 2  . DULoxetine (CYMBALTA) 30 MG capsule Take 1 capsule (30 mg total) by mouth daily. 30 capsule 2  . furosemide (LASIX) 20 MG tablet Take 1 tablet (20 mg total) by mouth daily. 30 tablet 2  . glipiZIDE (GLUCOTROL) 5 MG tablet Take 1 tablet (5 mg total) by mouth 2 (two) times daily before a meal. 60 tablet 2  . glucose blood (ACCU-CHEK AVIVA PLUS) test strip To check blood sugar once a day.  DX: E11.9 100 each 1  . losartan (COZAAR) 25 MG tablet Take 1 tablet (25 mg total) by mouth daily. 30 tablet 0  . meloxicam (MOBIC) 15 MG tablet Take 1 tablet (15 mg total) by mouth daily. 30 tablet 2  . omeprazole (PRILOSEC) 20 MG capsule Take 1 capsule (20 mg total) by  mouth daily. 30 capsule 2  . oxyCODONE-acetaminophen (PERCOCET) 10-325 MG tablet Take 1 tablet by mouth every 6 (six) hours as needed. 120 tablet 0  . simvastatin (ZOCOR) 20 MG tablet Take 1 tablet (20 mg total) by mouth at bedtime. 30 tablet 2  . Wound Dressings (ATRAPRO HYDROGEL) GEL Apply 1 application topically daily. 1 Tube 2   No current facility-administered medications for this visit.     Allergies  Allergen Reactions  . Latex     itching  . Doxycycline Rash  . Sulfa Antibiotics Itching, Rash and Swelling    Family History  Problem Relation Age of Onset  . Hypertension Mother   . Cataracts Mother   . Thyroid disease Mother   . Dementia Mother   . Heart failure Mother   . COPD Father   . Dementia Father   . Cataracts Father   . Aneurysm Father     Abdominal & Brain  . Breast cancer Sister   . Fibromyalgia Sister   . Migraines Sister   . Hypertension Sister   . Breast cancer Sister   . COPD Sister   . Uterine cancer Sister   . Migraines Brother   . Heart attack Brother     Social History  Social History  . Marital status: Married    Spouse name: N/A  . Number of children: 2  . Years of education: 9th Grade   Occupational History  . Disabled    Social History Main Topics  . Smoking status: Former Smoker    Quit date: 12/03/1996  . Smokeless tobacco: Never Used  . Alcohol use No  . Drug use: No  . Sexual activity: Yes    Birth control/ protection: None   Other Topics Concern  . Not on file   Social History Narrative  . No narrative on file     Constitutional: Denies fever, malaise, fatigue, headache or abrupt weight changes.  Respiratory: Denies difficulty breathing, shortness of breath, cough or sputum production.   Cardiovascular: Denies chest pain, chest tightness, palpitations or swelling in the hands or feet.    No other specific complaints in a complete review of systems (except as listed in HPI above).  Objective:   Physical  Exam   BP 134/80   Pulse 95   Temp 97.9 F (36.6 C) (Oral)   Wt (!) 354 lb (160.6 kg)   LMP  (LMP Unknown)   SpO2 95%   BMI 53.83 kg/m  Wt Readings from Last 3 Encounters:  01/17/17 (!) 354 lb (160.6 kg)  12/13/16 (!) 347 lb (157.4 kg)  01/31/16 (!) 345 lb (156.5 kg)    General: Appears her stated age, obese in NAD. Cardiovascular: Normal rate and rhythm. S1,S2 noted.  No murmur, rubs or gallops noted.  Pulmonary/Chest: Normal effort and positive vesicular breath sounds. No respiratory distress. No wheezes, rales or ronchi noted.  Neurological: Alert and oriented.   BMET    Component Value Date/Time   NA 136 12/13/2016 1056   NA 138 08/31/2014 0533   K 4.4 12/13/2016 1056   K 4.0 08/31/2014 0533   CL 97 12/13/2016 1056   CL 104 08/31/2014 0533   CO2 31 12/13/2016 1056   CO2 28 08/31/2014 0533   GLUCOSE 159 (H) 12/13/2016 1056   GLUCOSE 116 (H) 08/31/2014 0533   BUN 13 12/13/2016 1056   BUN 12 08/31/2014 0533   CREATININE 0.57 12/13/2016 1056   CREATININE 0.63 08/31/2014 0533   CALCIUM 9.3 12/13/2016 1056   CALCIUM 8.2 (L) 08/31/2014 0533   GFRNONAA >60 09/04/2015 2209   GFRNONAA >60 08/31/2014 0533   GFRNONAA >60 11/05/2013 0647   GFRAA >60 09/04/2015 2209   GFRAA >60 08/31/2014 0533   GFRAA >60 11/05/2013 0647    Lipid Panel     Component Value Date/Time   CHOL 272 (H) 12/13/2016 1056   TRIG 254.0 (H) 12/13/2016 1056   HDL 44.80 12/13/2016 1056   CHOLHDL 6 12/13/2016 1056   VLDL 50.8 (H) 12/13/2016 1056    CBC    Component Value Date/Time   WBC 7.4 12/13/2016 1056   RBC 4.49 12/13/2016 1056   HGB 11.9 (L) 12/13/2016 1056   HGB 11.0 (L) 08/31/2014 0533   HCT 36.1 12/13/2016 1056   HCT 33.9 (L) 08/31/2014 0533   PLT 317.0 12/13/2016 1056   PLT 230 08/31/2014 0533   MCV 80.3 12/13/2016 1056   MCV 86 08/31/2014 0533   MCH 26.6 09/04/2015 2209   MCHC 33.1 12/13/2016 1056   RDW 15.5 12/13/2016 1056   RDW 16.3 (H) 08/31/2014 0533   LYMPHSABS 1.4  08/31/2014 0533   MONOABS 0.5 08/31/2014 0533   EOSABS 0.2 08/31/2014 0533   BASOSABS 0.0 08/31/2014 0533    Hgb  A1C Lab Results  Component Value Date   HGBA1C 7.4 (H) 12/13/2016           Assessment & Plan:

## 2017-01-17 NOTE — Patient Instructions (Signed)
Hypertension Hypertension is another name for high blood pressure. High blood pressure forces your heart to work harder to pump blood. A blood pressure reading has two numbers, which includes a higher number over a lower number (example: 110/72). Follow these instructions at home:  Have your blood pressure rechecked by your doctor.  Only take medicine as told by your doctor. Follow the directions carefully. The medicine does not work as well if you skip doses. Skipping doses also puts you at risk for problems.  Do not smoke.  Monitor your blood pressure at home as told by your doctor. Contact a doctor if:  You think you are having a reaction to the medicine you are taking.  You have repeat headaches or feel dizzy.  You have puffiness (swelling) in your ankles.  You have trouble with your vision. Get help right away if:  You get a very bad headache and are confused.  You feel weak, numb, or faint.  You get chest or belly (abdominal) pain.  You throw up (vomit).  You cannot breathe very well. This information is not intended to replace advice given to you by your health care provider. Make sure you discuss any questions you have with your health care provider. Document Released: 05/07/2008 Document Revised: 04/26/2016 Document Reviewed: 09/11/2013 Elsevier Interactive Patient Education  2017 [employer]  

## 2017-01-17 NOTE — Assessment & Plan Note (Signed)
Still not at goal Increase Losartan to 50 mg daily She will send me readings in 2-3 weeks, via [employer]

## 2017-01-18 ENCOUNTER — Encounter: Payer: Self-pay | Admitting: Internal Medicine

## 2017-01-18 ENCOUNTER — Ambulatory Visit: Payer: BLUE CROSS/BLUE SHIELD

## 2017-01-22 ENCOUNTER — Encounter: Payer: BLUE CROSS/BLUE SHIELD | Admitting: Internal Medicine

## 2017-01-22 DIAGNOSIS — E11622 Type 2 diabetes mellitus with other skin ulcer: Secondary | ICD-10-CM | POA: Diagnosis not present

## 2017-01-23 NOTE — Progress Notes (Addendum)
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 Abnormalities Noted: No No Abnormalities Noted: No Callus: No Atrophie Blanche: No Crepitus: No Cyanosis: No Excoriation: No Ecchymosis: No Induration: No Erythema: No Rash: No Hemosiderin Staining: No Scarring: No Mottled: No Pallor: No Moisture Rubor: No No Abnormalities Noted: No Dry / Scaly: No Temperature / Pain Maceration: No Temperature: No Abnormality Tenderness on Palpation: Yes Wound Preparation Ulcer Cleansing: Rinsed/Irrigated with Saline, Other: soap and water, Topical Anesthetic Applied: None, Other: lidocaine 4%, Treatment Notes Wound #5 (Left, Lateral Lower Leg) 1. Cleansed with: Clean wound with Normal Saline 2. Anesthetic Topical  Lidocaine 4% cream to wound bed prior to debridement 3. Peri-wound Care: Other peri-wound care (specify in notes) 4. Dressing Applied: Prisma Ag 5. Secondary Dressing Applied ABD Pad 7. Secured with Tape 3 Layer Compression System - Left Lower Extremity Notes TCA Bossier, Alizee D. ([id number]) Electronic Signature(s) Signed: 01/22/2017 4:45:40 PM By: Alric Quan Entered By: Alric Quan on 01/22/2017 15:40:07 Siddique, Irlene D. ([id number]) -------------------------------------------------------------------------------- [place] Details Patient Name: Truluck, Nafeesah D. Date of Service: 01/22/2017 3:30 PM Medical Record Patient Account Number: [id number] Number: Treating RN: Ahmed Prima Jun 21, 1951 (65 y.o. Other Clinician: Date of Birth/Sex: Female) Treating ROBSON, MICHAEL Primary Care Andrius Andrepont: Webb Silversmith Ysabel Cowgill/Extender: G Referring Guliana Weyandt: Webb Silversmith Weeks in Treatment: 2 Vital Signs Time Taken: 15:33 Temperature (F): 98.1 Height (in): 67 Pulse (bpm): 84 Weight (lbs): 338 Respiratory Rate (breaths/min): 18 Body Mass Index (BMI): 52.9 Blood Pressure (mmHg): 159/78 Reference Range: 80 - 120 mg / dl Electronic Signature(s) Signed: 01/22/2017 4:45:40 PM By: Alric Quan Entered By: Alric Quan on 01/22/2017 15:34:55

## 2017-01-23 NOTE — Progress Notes (Signed)
[name] [name], [name] [name] ([id number]) Visit Report for 01/22/2017 Chief Complaint Document Details Patient Name: [name] [name], [name] Date of Service: 01/22/2017 3:30 PM Medical Record Patient Account Number: [id number] Number: Treating RN: [name] [name] 05/18/1951 (65 y.o. Other Clinician: Date of Birth/Sex: Female) Treating [name] [name] Primary Care Provider: Webb [name] Provider/Extender: [name] [name] Referring Provider: Colon [name] in Treatment: 2 Information Obtained from: Patient Chief Complaint chronic venous hypertension with ulcer formation left lower extremity 01/08/17; patient presents today with ulcerations on the left lateral lower leg Electronic Signature(s) Signed: 01/23/2017 9:58:44 AM By: [name] Ham MD Entered By: [name] [name] on 01/23/2017 07:49:58 [name] [name], [name] ([id number]) -------------------------------------------------------------------------------- HPI Details Patient Name: [name] [name], [name] Date of Service: 01/22/2017 3:30 PM Medical Record Patient Account Number: [id number] Number: Treating RN: [name] [name] December 29, 1950 (65 y.o. Other Clinician: Date of Birth/Sex: Female) Treating [name] [name], [name] [name] Primary Care Provider: Webb [name] Provider/Extender: [name] [name] Referring Provider: Webb [name] Weeks in Treatment: 2 History of Present Illness Location: left lower extremity Severity: improved since her initial hospitalization but stable since Duration: 6 weeks Context: began after her third treatment of Remicade for rheumatoid arthritis Modifying Factors: morbid obesity, chronic venous hypertension, lymphedema, diabetes mellitus type 2, methotrexate use, Remicade use, Associated Signs and Symptoms: pain and swelling left lower extremity HPI Description: she was seen last week where a 2 layer light compression system was applied. She removed that on Sunday because of itching. She states that she did see her primary care physician who has  prescribed her a stronger diuretic and she will begin that today. When she removed her dressing she applied some Neosporin and some gauze. There remains considerable concern about her ability to keep the wrap in place for a week she has been prescribed lymphedema pumps but she does not use those either. We will encourage her to use those twice a day for an hour each time. These were prescribed to her by [name] [name] READMISSION 01/04/17; this is a patient who is a type II diabetic on oral agents. I note she was seen in this clinic 2-3 years ago. I was not involved in her care at that point. She tells [name] that she is had ulcers on her lateral left lower extremity since just before Christmas. There was no obvious precipitant to this. 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 She has noted increasing erythema along with all of this and I think this is what prompted her to come in today. She does not have a known history of PAD or neuropathy ABI in this clinic was 1.06 on the left. The patient has not been requiring any compression on her legs although she does have compression stockings at home as well as external compression pumps. She has been using neither of these. She has not been systemically unwell the patient is also known to the local [facility] vascular group. Apparently her external compression pumps were previously prescribed by [name] She has had apparent bilateral ablations done in the past although I have none of these records. 01/15/17; the patient returns for rewrap of her left leg last week and reports that was put on too tight, she had to remove it on Sunday. She states this actually occurred her even though the previous 3 lateral wrap she was able to tolerate well. I don't think there  is been  much in the way of expansion of the erythema on the lateral left leg lateral left heel and lateral left foot. As mentioned previously I think this is chronic venous insufficiency and not really an active cellulitis. She has 2 small open areas in the lower left calf 01/22/17; the patient complains of really generalized pain. She has rheumatoid arthritis as well as [name] ([id number]) fibromyalgia. She had to take the wrap off yesterday. She has small open areas on the lateral left heel and foot. Electronic Signature(s) Signed: 01/23/2017 9:58:44 AM By: [name] Ham MD Entered By: [name] [name] on 01/23/2017 07:51:42 [name] [name], [name] ([id number]) -------------------------------------------------------------------------------- Physical Exam Details Patient Name: [name] Date of Service: 01/22/2017 3:30 PM Medical Record Patient Account Number: [id number] Number: Treating RN: [name] [name] 1951-04-19 (65 y.o. Other Clinician: Date of Birth/Sex: Female) Treating [name] [name] Primary Care Provider: Webb [name] Provider/Extender: [name] [name] Referring Provider: Webb [name] Weeks in Treatment: 2 Constitutional Patient is hypertensive.. Pulse regular and within target range for patient.[name] Respirations regular, non-labored and within target range.. Temperature is normal and within the target range for the patient.. Patient's appearance is neat and clean. Appears in no acute distress. Well nourished and well developed.. Notes Wound exam; the patient has continued are erythema on the anterior left leg and lateral left leg extending into the heel. I think this is chronic venous insufficiency there is no evidence of ongoing infection here. Her peripheral pulses are palpable. I think her wounds in general look healthy. No debridement was required today. Electronic Signature(s) Signed: 01/23/2017 9:58:44 AM By: [name] Ham MD Entered By: [name] [name] on 01/23/2017  07:52:43 [name] ([id number]) -------------------------------------------------------------------------------- Physician Orders Details Patient Name: [name] [name], [name] Date of Service: 01/22/2017 3:30 PM Medical Record Patient Account Number: [id number] Number: Treating RN: [name] [name] 1951/03/15 (65 y.o. Other Clinician: Date of Birth/Sex: Female) Treating Cassidy Tashiro, [place] Primary Care Provider: Webb [name] Provider/Extender: [name] [name] Referring Provider: Colon [name] in Treatment: 2 Verbal / Phone Orders: Yes ClinicianCarolyne Fiscal, Debi Read Back and Verified: Yes Diagnosis Coding ICD-10 Coding Code Description Chronic venous hypertension (idiopathic) with ulcer and inflammation of left lower I87.332 extremity L97.221 Non-pressure chronic ulcer of left calf limited to breakdown of skin E11.42 Type 2 diabetes mellitus with diabetic polyneuropathy E11.622 Type 2 diabetes mellitus with other skin ulcer Wound Cleansing Wound #2 Left,Distal,Lateral Lower Leg o Clean wound with Normal Saline. Wound #3 Left,Medial Lower Leg o Clean wound with Normal Saline. Wound #5 Left,Lateral Lower Leg o Clean wound with Normal Saline. Anesthetic Wound #2 Left,Distal,Lateral Lower Leg o Topical Lidocaine 4% cream applied to wound bed prior to debridement Wound #3 Left,Medial Lower Leg o Topical Lidocaine 4% cream applied to wound bed prior to debridement Wound #5 Left,Lateral Lower Leg o Topical Lidocaine 4% cream applied to wound bed prior to debridement Skin Barriers/Peri-Wound Care Wound #2 Left,Distal,Lateral Lower Leg o Triamcinolone Acetonide Ointment Wound #3 Left,Medial Lower Leg o Triamcinolone Acetonide Ointment [name] [name], [name] ([id number]) Wound #5 Left,Lateral Lower Leg o Triamcinolone Acetonide Ointment Primary Wound Dressing Wound #2 Left,Distal,Lateral Lower Leg o Prisma Ag Wound #3 Left,Medial Lower Leg o Prisma Ag Wound #5  Left,Lateral Lower Leg o Prisma Ag Secondary Dressing Wound #2 Left,Distal,Lateral Lower Leg o ABD pad o XtraSorb Wound #3 Left,Medial Lower Leg o ABD pad o XtraSorb Wound #5 Left,Lateral Lower Leg o ABD pad o XtraSorb Dressing Change Frequency Wound #2 Left,Distal,Lateral Lower Leg o Change dressing every week o Other: -  Nurse Visit as needed Wound #3 Left,Medial Lower Leg o Change dressing every week o Other: - Nurse Visit as needed Wound #5 Left,Lateral Lower Leg o Change dressing every week o Other: - Nurse Visit as needed Follow-up Appointments Wound #2 Left,Distal,Lateral Lower Leg o Return Appointment in 1 week. Wound #3 Left,Medial Lower Leg o Return Appointment in 1 week. Wound #5 Left,Lateral Lower Leg [name] [name], [name] ([id number]) o Return Appointment in 1 week. Edema Control Wound #2 Left,Distal,Lateral Lower Leg o 3 Layer Compression System - Left Lower Extremity Wound #3 Left,Medial Lower Leg o 3 Layer Compression System - Left Lower Extremity Wound #5 Left,Lateral Lower Leg o 3 Layer Compression System - Left Lower Extremity Consults o Vascular - [name] [name] Electronic Signature(s) Signed: 01/22/2017 4:45:40 PM By: Alric Quan Signed: 01/23/2017 9:58:44 AM By: [name] Ham MD Entered By: Alric Quan on 01/22/2017 16:02:29 [name] [name], [name] ([id number]) -------------------------------------------------------------------------------- Problem List Details Patient Name: Dicke, Meesha [name]. Date of Service: 01/22/2017 3:30 PM Medical Record Patient Account Number: [id number] Number: Treating RN: [name] [name] 1951-07-15 (65 y.o. Other Clinician: Date of Birth/Sex: Female) Treating Kandace Elrod Primary Care Provider: Webb [name] Provider/Extender: [name] [name] Referring Provider: Colon [name] in Treatment: 2 Active Problems ICD-10 Encounter Code Description Active Date Diagnosis I87.332  Chronic venous hypertension (idiopathic) with ulcer and 01/08/2017 Yes inflammation of left lower extremity L97.221 Non-pressure chronic ulcer of left calf limited to 01/08/2017 Yes breakdown of skin E11.42 Type 2 diabetes mellitus with diabetic polyneuropathy 01/08/2017 Yes E11.622 Type 2 diabetes mellitus with other skin ulcer 01/08/2017 Yes Inactive Problems Resolved Problems Electronic Signature(s) Signed: 01/23/2017 9:58:44 AM By: [name] Ham MD Entered By: [name] [name] on 01/23/2017 07:49:29 Bonner, Aijalon [name]. ([id number]) -------------------------------------------------------------------------------- Progress Note Details Patient Name: Dockstader, Jkayla [name]. Date of Service: 01/22/2017 3:30 PM Medical Record Patient Account Number: [id number] Number: Treating RN: [name] [name] 1951-10-15 (65 y.o. Other Clinician: Date of Birth/Sex: Female) Treating Lene Mckay Primary Care Provider: Webb [name] Provider/Extender: [name] [name] Referring Provider: Colon [name] in Treatment: 2 Subjective Chief Complaint Information obtained from Patient chronic venous hypertension with ulcer formation left lower extremity 01/08/17; patient presents today with ulcerations on the left lateral lower leg History of Present Illness (HPI) The following HPI elements were documented for the patient's wound: Location: left lower extremity Severity: improved since her initial hospitalization but stable since Duration: 6 weeks Context: began after her third treatment of Remicade for rheumatoid arthritis Modifying Factors: morbid obesity, chronic venous hypertension, lymphedema, diabetes mellitus type 2, methotrexate use, Remicade use, Associated Signs and Symptoms: pain and swelling left lower extremity she was seen last week where a 2 layer light compression system was applied. She removed that on Sunday because of itching. She states that she did see her primary care physician who has prescribed  her a stronger diuretic and she will begin that today. When she removed her dressing she applied some Neosporin and some gauze. There remains considerable concern about her ability to keep the wrap in place for a week she has been prescribed lymphedema pumps but she does not use those either. We will encourage her to use those twice a day for an hour each time. These were prescribed to her by [name] [name] READMISSION 01/04/17; this is a patient who is a type II diabetic on oral agents. I note she was seen in this clinic 2-3 years ago. I was not involved in her care at that point. She tells [name] that she is had ulcers on her  lateral left lower extremity since just before Christmas. There was no obvious precipitant to this. 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 She has noted increasing erythema along with all of this and I think this is what prompted her to come in today. She does not have a known history of PAD or neuropathy ABI in this clinic was 1.06 on the left. The patient has not been requiring any compression on her legs although she does have compression stockings at home as well as external compression pumps. She has been using neither of these. She has not been systemically unwell the patient is also known to the local vein and vascular group. Apparently her external compression pumps Vowels, Nari [name]. ([id number]) were previously prescribed by [name] She has had apparent bilateral ablations done in the past although I have none of these records. 01/15/17; the patient returns for rewrap of her left leg last week and reports that was put on too tight, she had to remove it on Sunday. She states this actually occurred her even though the previous 3 lateral wrap she was able to tolerate well. I don't  think there is been much in the way of expansion of the erythema on the lateral left leg lateral left heel and lateral left foot. As mentioned previously I think this is chronic venous insufficiency and not really an active cellulitis. She has 2 small open areas in the lower left calf 01/22/17; the patient complains of really generalized pain. She has rheumatoid arthritis as well as fibromyalgia. She had to take the wrap off yesterday. She has small open areas on the lateral left heel and foot. Objective Constitutional Patient is hypertensive.. Pulse regular and within target range for patient.[name] Respirations regular, non-labored and within target range.. Temperature is normal and within the target range for the patient.. Patient's appearance is neat and clean. Appears in no acute distress. Well nourished and well developed.. Vitals Time Taken: 3:33 PM, Height: 67 in, Weight: 338 lbs, BMI: 52.9, Temperature: 98.1 F, Pulse: 84 bpm, Respiratory Rate: 18 breaths/min, Blood Pressure: 159/78 mmHg. General Notes: Wound exam; the patient has continued are erythema on the anterior left leg and lateral left leg extending into the heel. I think this is chronic venous insufficiency there is no evidence of ongoing infection here. Her peripheral pulses are palpable. I think her wounds in general look healthy. No debridement was required today. Integumentary (Hair, Skin) Wound #2 status is Open. Original cause of wound was Blister. The wound is located on the Left,Distal,Lateral Lower Leg. The wound measures 2cm length x 1.2cm width x 0.1cm depth; 1.885cm^2 area and 0.188cm^3 volume. There is Fat Layer (Subcutaneous Tissue) Exposed exposed. There is no tunneling or undermining noted. There is a large amount of serous drainage noted. The wound margin is flat and intact. There is no granulation within the wound bed. There is a large (67-100%) amount of necrotic tissue within the wound bed including Eschar.  The periwound skin appearance exhibited: Excoriation. The periwound skin appearance did not exhibit: Callus, Crepitus, Induration, Rash, Scarring, Dry/Scaly, Maceration, Atrophie Blanche, Cyanosis, Ecchymosis, Hemosiderin Staining, Mottled, Pallor, Rubor, Erythema. Periwound temperature was noted as No Abnormality. The periwound has tenderness on palpation. Wound #3 status is Open. Original cause of wound was Blister. The wound is located on the Left,Medial Lower Leg.  The wound measures 0.7cm length x 0.5cm width x 0.1cm depth; 0.275cm^2 area and 0.027cm^3 volume. There is Fat Layer (Subcutaneous Tissue) Exposed exposed. There is no tunneling or undermining noted. There is a large amount of serosanguineous drainage noted. The wound margin is indistinct and nonvisible. There is no granulation within the wound bed. There is a large (67-100%) amount [name] [name], [name] ([id number]) of necrotic tissue within the wound bed including Eschar and Adherent Slough. The periwound skin appearance exhibited: Maceration. The periwound skin appearance did not exhibit: Callus, Crepitus, Excoriation, Induration, Rash, Scarring, Dry/Scaly, Atrophie Blanche, Cyanosis, Ecchymosis, Hemosiderin Staining, Mottled, Pallor, Rubor, Erythema. Periwound temperature was noted as No Abnormality. The periwound has tenderness on palpation. Wound #4 status is Healed - Epithelialized. Original cause of wound was Blister. The wound is located on the Left,Lateral Foot. The wound measures 0cm length x 0cm width x 0cm depth; 0cm^2 area and 0cm^3 volume. The wound is limited to skin breakdown. There is no tunneling or undermining noted. There is a none present amount of drainage noted. The wound margin is distinct with the outline attached to the wound base. There is no granulation within the wound bed. There is no necrotic tissue within the wound bed. Wound #5 status is Open. Original cause of wound was Blister. The wound is located on  the Left,Lateral Lower Leg. The wound measures 2cm length x 1cm width x 0.1cm depth; 1.571cm^2 area and 0.157cm^3 volume. There is Fat Layer (Subcutaneous Tissue) Exposed exposed. There is no tunneling or undermining noted. There is a large amount of serous drainage noted. The wound margin is indistinct and nonvisible. There is no granulation within the wound bed. There is a large (67-100%) amount of necrotic tissue within the wound bed including Eschar and Adherent Slough. The periwound skin appearance did not exhibit: Callus, Crepitus, Excoriation, Induration, Rash, Scarring, Dry/Scaly, Maceration, Atrophie Blanche, Cyanosis, Ecchymosis, Hemosiderin Staining, Mottled, Pallor, Rubor, Erythema. Periwound temperature was noted as No Abnormality. The periwound has tenderness on palpation. 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 Wound #3 Left,Medial Lower Leg: Clean wound with Normal Saline. Wound #5 Left,Lateral Lower Leg: Clean wound with Normal Saline. [name] [name], [name] ([id number]) Anesthetic: Wound #2 Left,Distal,Lateral Lower Leg: Topical Lidocaine 4% cream applied to wound bed prior to debridement Wound #3 Left,Medial Lower Leg: Topical Lidocaine 4% cream applied to wound bed prior to debridement Wound #5 Left,Lateral Lower Leg: Topical Lidocaine 4% cream applied to wound bed prior to debridement Skin Barriers/Peri-Wound Care: Wound #2 Left,Distal,Lateral Lower Leg: Triamcinolone Acetonide Ointment Wound #3 Left,Medial Lower Leg: Triamcinolone Acetonide Ointment Wound #5 Left,Lateral Lower  Leg: Triamcinolone Acetonide Ointment Primary Wound Dressing: Wound #2 Left,Distal,Lateral Lower Leg: Prisma Ag Wound #3 Left,Medial Lower Leg: Prisma Ag Wound #5 Left,Lateral Lower Leg: Prisma Ag Secondary Dressing: Wound #2 Left,Distal,Lateral Lower Leg: ABD pad XtraSorb Wound #3 Left,Medial Lower Leg: ABD pad XtraSorb Wound #5 Left,Lateral Lower Leg: ABD pad XtraSorb Dressing Change Frequency: Wound #2 Left,Distal,Lateral Lower Leg: Change dressing every week Other: - Nurse Visit as needed Wound #3 Left,Medial Lower Leg: Change dressing every week Other: - Nurse Visit as needed Wound #5 Left,Lateral Lower Leg: Change dressing every week Other: -  Nurse Visit as needed Follow-up Appointments: Wound #2 Left,Distal,Lateral Lower Leg: Return Appointment in 1 week. Wound #3 Left,Medial Lower Leg: Return Appointment in 1 week. Wound #5 Left,Lateral Lower Leg: Return Appointment in 1 week. Edema Control: Wound #2 Left,Distal,Lateral Lower Leg: 3 Layer Compression System - Left Lower Extremity [name] [name], [name] ([id number]) Wound #3 Left,Medial Lower Leg: 3 Layer Compression System - Left Lower Extremity Wound #5 Left,Lateral Lower Leg: 3 Layer Compression System - Left Lower Extremity Consults ordered were: Vascular - [name] [name] Continue with TCA to the extensive venous inflammation area on the left leg and foot Continue with Prisma, ABDs and 3 layer compression Patient does not tolerate compression well both lower wraps and likely compression stockings. She has pumps at home that she cannot tolerate because of discomfort. There is no evidence of infection in the left leg I think the patient's pain is systemic either related to RA were more likely fibromyalgia Electronic Signature(s) Signed: 01/23/2017 9:58:44 AM By: [name] Ham MD Entered By: [name] [name] on 01/23/2017 07:54:30 [name] [name], [name]  ([id number]) -------------------------------------------------------------------------------- SuperBill Details Patient Name: Gittings, Verdelle [name]. Date of Service: 01/22/2017 Medical Record Patient Account Number: [id number] Number: Treating RN: [name] [name] 1951-11-27 (65 y.o. Other Clinician: Date of Birth/Sex: Female) Treating Tavio Biegel Primary Care Provider: Webb [name] Provider/Extender: [name] [name] Referring Provider: Webb [name] Service Line: Outpatient Weeks in Treatment: 2 Diagnosis Coding ICD-10 Codes Code Description Chronic venous hypertension (idiopathic) with ulcer and inflammation of left lower I87.332 extremity L97.221 Non-pressure chronic ulcer of left calf limited to breakdown of skin E11.42 Type 2 diabetes mellitus with diabetic polyneuropathy E11.622 Type 2 diabetes mellitus with other skin ulcer Facility Procedures CPT4: Description Modifier Quantity Code [id number] (Facility Use Only) [phone] - APPLY [place] LT 1 LEG Physician Procedures CPT4: Description Modifier Quantity Code [id number] - WC PHYS LEVEL 2 - EST PT 1 ICD-10 Description Diagnosis I87.332 Chronic venous hypertension (idiopathic) with ulcer and inflammation of left lower extremity L97.221 Non-pressure chronic ulcer of  left calf limited to breakdown of skin Electronic Signature(s) Signed: 01/23/2017 9:58:44 AM By: [name] Ham MD Previous Signature: 01/22/2017 4:45:40 PM Version By: Alric Quan Entered By: [name] [name] on 01/23/2017 07:54:59

## 2017-01-31 ENCOUNTER — Encounter (INDEPENDENT_AMBULATORY_CARE_PROVIDER_SITE_OTHER): Payer: Self-pay | Admitting: Vascular Surgery

## 2017-01-31 ENCOUNTER — Ambulatory Visit (INDEPENDENT_AMBULATORY_CARE_PROVIDER_SITE_OTHER): Payer: BLUE CROSS/BLUE SHIELD | Admitting: Vascular Surgery

## 2017-01-31 VITALS — BP 156/89 | HR 100 | Resp 18 | Ht 67.5 in | Wt 355.0 lb

## 2017-01-31 DIAGNOSIS — I872 Venous insufficiency (chronic) (peripheral): Secondary | ICD-10-CM

## 2017-01-31 DIAGNOSIS — E119 Type 2 diabetes mellitus without complications: Secondary | ICD-10-CM | POA: Diagnosis not present

## 2017-01-31 DIAGNOSIS — I890 Lymphedema, not elsewhere classified: Secondary | ICD-10-CM | POA: Insufficient documentation

## 2017-01-31 DIAGNOSIS — I2510 Atherosclerotic heart disease of native coronary artery without angina pectoris: Secondary | ICD-10-CM | POA: Diagnosis not present

## 2017-01-31 DIAGNOSIS — I10 Essential (primary) hypertension: Secondary | ICD-10-CM | POA: Diagnosis not present

## 2017-01-31 NOTE — Progress Notes (Signed)
MRN : [id number]  [name] [name] is a 66 y.o. (1951/03/02) female who presents with chief complaint of  Chief Complaint  Patient presents with  . New Evaluation    Ulcers on both lower legs  .  History of Present Illness: Patient is seen for evaluation of leg pain and swelling associated with new onset ulcerations of both ankle areas. The patient first noticed the swelling remotely. The swelling is associated with pain and discoloration. The pain and swelling worsens with prolonged dependency and improves with elevation. The pain is unrelated to activity.  The patient notes that in the morning the legs are better but the leg symptoms worsened throughout the course of the day. The patient has also noted a progressive worsening of the discoloration in the ankle and shin area.   The patient notes that an ulcer has developed acutely without specific trauma and since it occurred it has been very slow to heal.  There is a moderate amount of drainage associated with the open area.  The wound is also very painful.  The patient denies claudication symptoms or rest pain symptoms.  The patient denies DJD and LS spine disease.  The patient has not had any past angiography, interventions or vascular surgery.  Elevation makes the leg symptoms better, dependency makes them much worse. The patient denies any recent changes in medications.  The patient has not been wearing graduated compression.  The patient denies a history of DVT or PE. There is no prior history of phlebitis. There is no history of primary lymphedema.  No history of malignancies. No history of trauma or groin or pelvic surgery. There is no history of radiation treatment to the groin or pelvis    Current Meds  Medication Sig  . aspirin 81 MG tablet Take 1 tablet (81 mg total) by mouth daily.  . carvedilol (COREG) 3.125 MG tablet Take 1 tablet (3.125 mg total) by mouth 2 (two) times daily with a meal.  . cetirizine (ZYRTEC) 10 MG  tablet Take 1 tablet (10 mg total) by mouth daily.  . citalopram (CELEXA) 40 MG tablet Take 40 mg by mouth daily.  [name] glipiZIDE (GLUCOTROL) 5 MG tablet Take 1 tablet (5 mg total) by mouth 2 (two) times daily before a meal.  . glucose blood (ACCU-CHEK AVIVA PLUS) test strip To check blood sugar once a day.  DX: E11.9  . losartan (COZAAR) 50 MG tablet Take 1 tablet (50 mg total) by mouth daily.  . meloxicam (MOBIC) 15 MG tablet Take 1 tablet (15 mg total) by mouth daily.  [name] omeprazole (PRILOSEC) 20 MG capsule Take 1 capsule (20 mg total) by mouth daily.  [name] oxyCODONE-acetaminophen (PERCOCET) 10-325 MG tablet Take 1 tablet by mouth every 6 (six) hours as needed.  . Wound Dressings (ATRAPRO HYDROGEL) GEL Apply 1 application topically daily.    Past Medical History:  Diagnosis Date  . Arthritis    Rhumetoid arthritis  . Cellulitis of both lower extremities    Chronic  . CHF (congestive heart failure) ([place])   . Coronary artery disease   . Diabetes mellitus without complication ([place])   . Fibromyalgia   . Fibromyalgia affecting shoulder region   . Hyperlipidemia   . Hypertension   . Spinal stenosis     Past Surgical History:  Procedure Laterality Date  . ANAL FISSURECTOMY  01/2004  . HERNIA REPAIR    . MEDIAL PARTIAL KNEE REPLACEMENT  01/06/2014  . UMBILICAL HERNIA REPAIR N/A 09/05/2015  Procedure: HERNIA REPAIR incarcerated UMBILICAL ADULT;  Surgeon: Sherri Rad, MD;  Location: [facility] ORS;  Service: General;  Laterality: N/A;  . Vascular Stent  11/04/2013    Social History Social History  Substance Use Topics  . Smoking status: Former Smoker    Quit date: 12/03/1996  . Smokeless tobacco: Never Used  . Alcohol use No    Family History Family History  Problem Relation Age of Onset  . Hypertension Mother   . Cataracts Mother   . Thyroid disease Mother   . Dementia Mother   . Heart failure Mother   . COPD Father   . Dementia Father   . Cataracts Father   . Aneurysm Father      Abdominal & Brain  . Breast cancer Sister   . Fibromyalgia Sister   . Migraines Sister   . Hypertension Sister   . Breast cancer Sister   . COPD Sister   . Uterine cancer Sister   . Migraines Brother   . Heart attack Brother   No family history of bleeding/clotting disorders, porphyria or autoimmune disease   Allergies  Allergen Reactions  . Latex     itching  . Doxycycline Rash  . Sulfa Antibiotics Itching, Rash and Swelling     REVIEW OF SYSTEMS (Negative unless checked)  Constitutional: [] Weight loss  [] Fever  [] Chills Cardiac: [] Chest pain   [] Chest pressure   [] Palpitations   [] Shortness of breath when laying flat   [x] Shortness of breath with exertion. Vascular:  [] Pain in legs with walking   [x] Pain in legs with standing  [] History of DVT   [] Phlebitis   [x] Swelling in legs   [x] Varicose veins   [x] Non-healing ulcers Pulmonary:   [] Uses home oxygen   [] Productive cough   [] Hemoptysis   [] Wheeze  [] COPD   [] Asthma Neurologic:  [] Dizziness   [] Seizures   [] History of stroke   [] History of TIA  [] Aphasia   [] Vissual changes   [] Weakness or numbness in arm   [] Weakness or numbness in leg Musculoskeletal:   [] Joint swelling   [x] Joint pain   [] Low back pain Hematologic:  [] Easy bruising  [] Easy bleeding   [] Hypercoagulable state   [] Anemic Gastrointestinal:  [] Diarrhea   [] Vomiting  [] Gastroesophageal reflux/heartburn   [] Difficulty swallowing. Genitourinary:  [] Chronic kidney disease   [] Difficult urination  [] Frequent urination   [] Blood in urine Skin:  [x] Rashes   [x] Ulcers  Psychological:  [] History of anxiety   []  History of major depression.  Physical Examination  Vitals:   01/31/17 1408  BP: (!) 156/89  Pulse: 100  Resp: 18  Weight: (!) 355 lb (161 kg)  Height: 5' 7.5" (1.715 m)   Body mass index is 54.78 kg/m. Gen: WD/WN, NAD Head: [place]/AT, No temporalis wasting.  Ear/Nose/Throat: Hearing grossly intact, nares w/o erythema or drainage, poor dentition Eyes:  PER, EOMI, sclera nonicteric.  Neck: Supple, no masses.  No bruit or JVD.  Pulmonary:  Good air movement, clear to auscultation bilaterally, no use of accessory muscles.  Cardiac: RRR, normal S1, S2, no Murmurs. Vascular: severe venous stasis dermatitis of both ankles with bilateral ulcerations.  3-4+ edema Vessel Right Left  Radial Palpable Palpable  Ulnar Palpable Palpable  Brachial Palpable Palpable  Carotid Palpable Palpable  Femoral Palpable Palpable  Popliteal Palpable Palpable  PT Not Palpable Not Palpable  DP Trace Palpable Trace Palpable   Gastrointestinal: soft, non-distended. No guarding/no peritoneal signs.  Musculoskeletal: M/S 4/5 both legs 5/5 arms.  No deformity or atrophy.  Neurologic: CN 2-12 intact. Pain and light touch intact in extremities.  Symmetrical.  Speech is fluent. Motor exam as listed above. Psychiatric: Judgment intact, Mood & affect appropriate for pt's clinical situation. Dermatologic: venous rashes with ulcers noted.  No changes consistent with cellulitis. Lymph : No Cervical lymphadenopathy, no lichenification or skin changes of chronic lymphedema.  CBC Lab Results  Component Value Date   WBC 7.4 12/13/2016   HGB 11.9 (L) 12/13/2016   HCT 36.1 12/13/2016   MCV 80.3 12/13/2016   PLT 317.0 12/13/2016    BMET    Component Value Date/Time   NA 136 12/13/2016 1056   NA 138 08/31/2014 0533   K 4.4 12/13/2016 1056   K 4.0 08/31/2014 0533   CL 97 12/13/2016 1056   CL 104 08/31/2014 0533   CO2 31 12/13/2016 1056   CO2 28 08/31/2014 0533   GLUCOSE 159 (H) 12/13/2016 1056   GLUCOSE 116 (H) 08/31/2014 0533   BUN 13 12/13/2016 1056   BUN 12 08/31/2014 0533   CREATININE 0.57 12/13/2016 1056   CREATININE 0.63 08/31/2014 0533   CALCIUM 9.3 12/13/2016 1056   CALCIUM 8.2 (L) 08/31/2014 0533   GFRNONAA >60 09/04/2015 2209   GFRNONAA >60 08/31/2014 0533   GFRNONAA >60 11/05/2013 0647   GFRAA >60 09/04/2015 2209   GFRAA >60 08/31/2014 0533   GFRAA  >60 11/05/2013 0647   CrCl cannot be calculated (Patient's most recent lab result is older than the maximum 21 days allowed.).  COAG No results found for: INR, PROTIME  Radiology No results found.  Assessment/Plan 1. Chronic venous insufficiency No surgery or intervention at this point in time.    I have had a long discussion with the patient regarding venous insufficiency and why it  causes symptoms, specifically venous ulceration . I have discussed with the patient the chronic skin changes that accompany venous insufficiency and the long term sequela such as infection and recurring  ulceration.  Patient will be placed in [employer] which will be changed weekly drainage permitting.  In addition, behavioral modification including several periods of elevation of the lower extremities during the day will be continued. Achieving a position with the ankles at heart level was stressed to the patient  The patient is instructed to begin routine exercise, especially walking on a daily basis  Following the review of the ultrasound the patient will follow up in one week to reassess the degree of swelling and the control that [place] therapy is offering.   The patient can be assessed for graduated compression stockings or wraps as well as a Lymph Pump once the ulcers are healed.   2. Lymphedema Compression wraps and continue lymphpump  3. Essential hypertension Continue antihypertensive medications as already ordered, these medications have been reviewed and there are no changes at this time.   4. Type 2 diabetes mellitus without complication, without long-term current use of insulin (HCC) Continue hypoglycemic medications as already ordered, these medications have been reviewed and there are no changes at this time.  Hgb A1C to be monitored as already arranged by primary service  5. Atherosclerosis of native coronary artery of native heart without angina pectoris Continue cardiac and  antihypertensive medications as already ordered and reviewed, no changes at this time.  Continue statin as ordered and reviewed, no changes at this time  Nitrates PRN for chest pain  Hortencia Pilar, MD  01/31/2017 9:42 PM

## 2017-02-01 ENCOUNTER — Encounter: Payer: BLUE CROSS/BLUE SHIELD | Attending: Surgery | Admitting: Surgery

## 2017-02-01 DIAGNOSIS — Z96652 Presence of left artificial knee joint: Secondary | ICD-10-CM | POA: Diagnosis not present

## 2017-02-01 DIAGNOSIS — I890 Lymphedema, not elsewhere classified: Secondary | ICD-10-CM | POA: Diagnosis not present

## 2017-02-01 DIAGNOSIS — E11622 Type 2 diabetes mellitus with other skin ulcer: Secondary | ICD-10-CM | POA: Diagnosis present

## 2017-02-01 DIAGNOSIS — Z7984 Long term (current) use of oral hypoglycemic drugs: Secondary | ICD-10-CM | POA: Diagnosis not present

## 2017-02-01 DIAGNOSIS — I110 Hypertensive heart disease with heart failure: Secondary | ICD-10-CM | POA: Diagnosis not present

## 2017-02-01 DIAGNOSIS — I509 Heart failure, unspecified: Secondary | ICD-10-CM | POA: Insufficient documentation

## 2017-02-01 DIAGNOSIS — M069 Rheumatoid arthritis, unspecified: Secondary | ICD-10-CM | POA: Insufficient documentation

## 2017-02-01 DIAGNOSIS — Z8711 Personal history of peptic ulcer disease: Secondary | ICD-10-CM | POA: Diagnosis not present

## 2017-02-01 DIAGNOSIS — I87332 Chronic venous hypertension (idiopathic) with ulcer and inflammation of left lower extremity: Secondary | ICD-10-CM | POA: Insufficient documentation

## 2017-02-01 DIAGNOSIS — E1142 Type 2 diabetes mellitus with diabetic polyneuropathy: Secondary | ICD-10-CM | POA: Diagnosis not present

## 2017-02-01 DIAGNOSIS — L97212 Non-pressure chronic ulcer of right calf with fat layer exposed: Secondary | ICD-10-CM | POA: Insufficient documentation

## 2017-02-01 DIAGNOSIS — F419 Anxiety disorder, unspecified: Secondary | ICD-10-CM | POA: Diagnosis not present

## 2017-02-01 DIAGNOSIS — L97221 Non-pressure chronic ulcer of left calf limited to breakdown of skin: Secondary | ICD-10-CM | POA: Insufficient documentation

## 2017-02-01 DIAGNOSIS — Z6841 Body Mass Index (BMI) 40.0 and over, adult: Secondary | ICD-10-CM | POA: Diagnosis not present

## 2017-02-02 NOTE — Progress Notes (Signed)
Chapa, REMONIA DIPIAZZA ([id number]) Visit Report for 02/01/2017 Chief Complaint Document Details Patient Name: Santo, Kamsiyochukwu D. Date of Service: 02/01/2017 9:00 AM Medical Record Number: [id number] Patient Account Number: [id number] Date of Birth/Sex: 1951/06/06 (66 y.o. Female) Treating RN: Cornell Barman Primary Care Provider: Webb Silversmith Other Clinician: Referring Provider: Webb Silversmith Treating Provider/Extender: Frann Rider in Treatment: 3 Information Obtained from: Patient Chief Complaint chronic venous hypertension with ulcer formation left lower extremity 01/08/17; patient presents today with ulcerations on the left lateral lower leg Electronic Signature(s) Signed: 02/01/2017 10:16:07 AM By: Christin Fudge MD, FACS Previous Signature: 02/01/2017 10:14:31 AM Version By: Christin Fudge MD, FACS Entered By: Christin Fudge on 02/01/2017 10:16:06 Shughart, Anaiz D. ([id number]) -------------------------------------------------------------------------------- HPI Details Patient Name: Middlesworth, Feliciana D. Date of Service: 02/01/2017 9:00 AM Medical Record Number: [id number] Patient Account Number: [id number] Date of Birth/Sex: January 28, 1951 (66 y.o. Female) Treating RN: Cornell Barman Primary Care Provider: Webb Silversmith Other Clinician: Referring Provider: Webb Silversmith Treating Provider/Extender: Frann Rider in Treatment: 3 History of Present Illness Location: left lower extremity Severity: improved since her initial hospitalization but stable since Duration: 6 weeks Context: began after her third treatment of Remicade for rheumatoid arthritis Modifying Factors: morbid obesity, chronic venous hypertension, lymphedema, diabetes mellitus type 2, methotrexate use, Remicade use, Associated Signs and Symptoms: pain and swelling left lower extremity HPI Description: she was seen last week where a 2 layer light compression system was applied. She removed that on Sunday because of itching. She states that  she did see her primary care physician who has prescribed her a stronger diuretic and she will begin that today. When she removed her dressing she applied some Neosporin and some gauze. There remains considerable concern about her ability to keep the wrap in place for a week she has been prescribed lymphedema pumps but she does not use those either. We will encourage her to use those twice a day for an hour each time. These were prescribed to her by Dr. Hinton Lovely READMISSION 01/04/17; this is a patient who is a type II diabetic on oral agents. I note she was seen in this clinic 2-3 years ago. I was not involved in her care at that point. She tells [name] that she is had ulcers on her lateral left lower extremity since just before Christmas. There was no obvious precipitant to this. She apparently has been on long-term suppressive penicillin prescribed by Dr. 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 She has noted increasing erythema along with all of this and I think this is what prompted her to come in today. She does not have a known history of PAD or neuropathy ABI in this clinic was 1.06 on the left. The patient has not been requiring any compression on her legs although she does have compression stockings at home as well as external compression pumps. She has been using neither of these. She has not been systemically unwell the patient is also known to the local [facility] vascular [facility]. Apparently her external compression pumps were previously prescribed by Dr. Delana Meyer. She has had apparent bilateral ablations done in the past although I have none of these records. 01/15/17; the patient returns for rewrap of her left leg last week and reports that was put on too tight, she had to remove it on Sunday. She states this actually occurred her even though the previous 3 lateral wrap  she was able  to tolerate well. I don't think there is been much in the way of expansion of the erythema on the lateral left leg lateral left heel and lateral left foot. As mentioned previously I think this is chronic venous insufficiency and not really an active cellulitis. She has 2 small open areas in the lower left calf 01/22/17; the patient complains of really generalized pain. She has rheumatoid arthritis as well as fibromyalgia. She had to take the wrap off yesterday. She has small open areas on the lateral left heel and foot. Getman, Milaya D. ([id number]) 02/01/2017 -- the patient is seen by me today as she's had compression wraps for 10 days and I understand she did not realize she had to come back to see [name]. She did see Dr. Delana Meyer yesterday and his notes are not in, but the patient tells [name] that he is going to recommend a lymphedema pump and has not recommended any further venous workup or intervention. Electronic Signature(s) Signed: 02/01/2017 10:17:55 AM By: Christin Fudge MD, FACS Entered By: Christin Fudge on 02/01/2017 10:17:55 Kochan, Dorine D. ([id number]) -------------------------------------------------------------------------------- Physical Exam Details Patient Name: Kachmar, Ayla D. Date of Service: 02/01/2017 9:00 AM Medical Record Number: [id number] Patient Account Number: [id number] Date of Birth/Sex: [birth year] (66 y.o. Female) Treating RN: Cornell Barman Primary Care Provider: Webb Silversmith Other Clinician: Referring Provider: Webb Silversmith Treating Provider/Extender: Frann Rider in Treatment: 3 Constitutional . Pulse regular. Respirations normal and unlabored. Afebrile. . Eyes Nonicteric. Reactive to light. Ears, Nose, Mouth, and Throat Lips, teeth, and gums WNL.[name] Moist mucosa without lesions. Neck supple and nontender. No palpable supraclavicular or cervical adenopathy. Normal sized without goiter. Respiratory WNL. No retractions.. Breath sounds WNL, No rubs, rales,  rhonchi, or wheeze.. Cardiovascular Heart rhythm and rate regular, no murmur or gallop.. Pedal Pulses WNL. No clubbing, cyanosis or edema. Chest Breasts symmetical and no nipple discharge.. Breast tissue WNL, no masses, lumps, or tenderness.. Lymphatic No adneopathy. No adenopathy. No adenopathy. Musculoskeletal Adexa without tenderness or enlargement.. Digits and nails w/o clubbing, cyanosis, infection, petechiae, ischemia, or inflammatory conditions.. Integumentary (Hair, Skin) No suspicious lesions. No crepitus or fluctuance. No peri-wound warmth or erythema. No masses.[name] Psychiatric Judgement and insight Intact.. No evidence of depression, anxiety, or agitation.. Notes she has a new wound on her right lower extremity which is down to subcutaneous tissue but no sharp debridement was required today. The wound on the left lower extremity continues to look healthy and no sharp debridement was required today. Electronic Signature(s) Signed: 02/01/2017 10:18:31 AM By: Christin Fudge MD, FACS Entered By: Christin Fudge on 02/01/2017 10:18:30 Jaffe, Kanisha D. ([id number]) -------------------------------------------------------------------------------- Physician Orders Details Patient Name: Nordby, Mykenzie D. Date of Service: 02/01/2017 9:00 AM Medical Record Number: [id number] Patient Account Number: [id number] Date of Birth/Sex: [birth year] (66 y.o. Female) Treating RN: Cornell Barman Primary Care Provider: Webb Silversmith Other Clinician: Referring Provider: Webb Silversmith Treating Provider/Extender: Frann Rider in Treatment: 3 Verbal / Phone Orders: No Diagnosis Coding Wound Cleansing Wound #2 Left,Distal,Lateral Lower Leg o Clean wound with Normal Saline. Wound #3 Left,Medial Lower Leg o Clean wound with Normal Saline. Wound #6 Right Lower Leg o Clean wound with Normal Saline. Anesthetic Wound #2 Left,Distal,Lateral Lower Leg o Topical Lidocaine 4% cream applied to wound bed  prior to debridement Wound #3 Left,Medial Lower Leg o Topical Lidocaine 4% cream applied to wound bed prior to debridement Wound #6 Right Lower Leg o Topical Lidocaine 4% cream applied to wound bed prior to debridement  Primary Wound Dressing Wound #2 Left,Distal,Lateral Lower Leg o Prisma Ag Wound #3 Left,Medial Lower Leg o Prisma Ag Wound #6 Right Lower Leg o Prisma Ag Secondary Dressing Wound #2 Left,Distal,Lateral Lower Leg o ABD pad Wound #3 Left,Medial Lower Leg o ABD pad Mcmenamy, Jaymee D. ([id number]) Wound #6 Right Lower Leg o ABD pad Dressing Change Frequency Wound #2 Left,Distal,Lateral Lower Leg o Change dressing every week o Other: - Nurse Visit as needed Wound #3 Left,Medial Lower Leg o Change dressing every week o Other: - Nurse Visit as needed Wound #6 Right Lower Leg o Change dressing every week o Other: - Nurse Visit as needed Follow-up Appointments Wound #2 Left,Distal,Lateral Lower Leg o Return Appointment in 1 week. - with Dr. Dellia Nims Wound #3 Left,Medial Lower Leg o Return Appointment in 1 week. - with Dr. Dellia Nims Wound #6 Right Lower Leg o Return Appointment in 1 week. - with Dr. Dellia Nims Edema Control Wound #2 Left,Distal,Lateral Lower Leg o 3 Layer Compression System - Bilateral Wound #3 Left,Medial Lower Leg o 3 Layer Compression System - Bilateral Wound #6 Right Lower Leg o 3 Layer Compression System - Bilateral Electronic Signature(s) Signed: 02/01/2017 3:38:42 PM By: Christin Fudge MD, FACS Entered By: Christin Fudge on 02/01/2017 10:18:51 Tyree, Anniah D. ([id number]) -------------------------------------------------------------------------------- Problem List Details Patient Name: Matton, Steve D. Date of Service: 02/01/2017 9:00 AM Medical Record Number: [id number] Patient Account Number: [id number] Date of Birth/Sex: July 20, 1951 (66 y.o. 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 ([id number]) -------------------------------------------------------------------------------- Progress Note Details Patient Name: Siwik, Tameria D. Date of Service: 02/01/2017 9:00 AM Medical Record Number: [id number] Patient Account Number: [id number] Date of Birth/Sex: 12/15/1950 (66 y.o. Female) Treating RN: Cornell Barman Primary Care Provider: Webb Silversmith Other Clinician: Referring Provider: Webb Silversmith Treating Provider/Extender: Frann Rider in Treatment: 3 Subjective Chief Complaint Information obtained from Patient chronic venous hypertension with ulcer formation left lower extremity 01/08/17; patient presents today with ulcerations on the left lateral lower leg History of Present Illness (HPI) The following HPI elements were documented for the patient's wound: Location: left lower extremity Severity: improved since her initial hospitalization but stable since Duration: 6 weeks Context: began after her third treatment of Remicade for rheumatoid arthritis Modifying Factors: morbid obesity, chronic  venous hypertension, lymphedema, diabetes mellitus type 2, methotrexate use, Remicade use, Associated Signs and Symptoms: pain and swelling left lower extremity she was seen last week where a 2 layer light compression system was applied. She removed that on Sunday because of itching. She states that she did see her primary care physician who has prescribed her a stronger diuretic and she will begin that today. When she removed her dressing she applied some Neosporin and some gauze. There remains considerable concern about her ability to keep the wrap in  place for a week she has been prescribed lymphedema pumps but she does not use those either. We will encourage her to use those twice a day for an hour each time. These were prescribed to her by Dr. Hinton Lovely READMISSION 01/04/17; this is a patient who is a type II diabetic on oral agents. I note she was seen in this clinic 2-3 years ago. I was not involved in her care at that point. She tells [name] that she is had ulcers on her lateral left lower extremity since just before Christmas. There was no obvious precipitant to this. She apparently has been on long-term suppressive penicillin prescribed by Dr. 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 She has noted increasing erythema along with all of this and I think this is what prompted her to come in today. She does not have a known history of PAD or neuropathy ABI in this clinic was 1.06 on the left. The patient has not been requiring any compression on her legs although she does have compression stockings at home as well as external compression pumps. She has been using neither of these. She has not been systemically unwell the patient is also known to the local vein and vascular group. Apparently her external compression pumps were previously prescribed by Dr. Delana Meyer. She has had apparent  bilateral ablations done in the past although I have none of these records. Bergman, Reika D. ([id number]) 01/15/17; the patient returns for rewrap of her left leg last week and reports that was put on too tight, she had to remove it on Sunday. She states this actually occurred her even though the previous 3 lateral wrap she was able to tolerate well. I don't think there is been much in the way of expansion of the erythema on the lateral left leg lateral left heel and lateral left foot. As mentioned previously I think this is chronic venous insufficiency and not really an active cellulitis. She has 2 small open areas in the lower left calf 01/22/17; the patient complains of really generalized pain. She has rheumatoid arthritis as well as fibromyalgia. She had to take the wrap off yesterday. She has small open areas on the lateral left heel and foot. 02/01/2017 -- the patient is seen by me today as she's had compression wraps for 10 days and I understand she did not realize she had to come back to see [name]. She did see Dr. Delana Meyer yesterday and his notes are not in, but the patient tells [name] that he is going to recommend a lymphedema pump and has not recommended any further venous workup or intervention. Objective Constitutional Pulse regular. Respirations normal and unlabored. Afebrile. Vitals Time Taken: 9:00 AM, Height: 67 in, Weight: 338 lbs, BMI: 52.9, Temperature: 98.0 F, Pulse: 97 bpm, Respiratory Rate: 16 breaths/min, Blood Pressure: 161/84 mmHg. General Notes: Patient to follow-up with PCP regarding BP. MD notified. Eyes Nonicteric. Reactive to light. Ears, Nose, Mouth, and Throat Lips, teeth, and gums WNL.[name] Moist mucosa without lesions. Neck supple and nontender. No palpable supraclavicular or cervical adenopathy. Normal sized without goiter. Respiratory WNL. No retractions.. Breath sounds WNL, No rubs, rales, rhonchi, or wheeze.. Cardiovascular Heart rhythm and rate regular, no  murmur or gallop.. Pedal Pulses WNL. No clubbing, cyanosis or edema. Chest Breasts symmetical and no nipple discharge.. Breast tissue WNL, no masses, lumps, or tenderness.. Lymphatic No adneopathy. No adenopathy. No  adenopathy. Cocco, Shima D. ([id number]) Musculoskeletal Adexa without tenderness or enlargement.. Digits and nails w/o clubbing, cyanosis, infection, petechiae, ischemia, or inflammatory conditions.[name] Psychiatric Judgement and insight Intact.. No evidence of depression, anxiety, or agitation.. General Notes: she has a new wound on her right lower extremity which is down to subcutaneous tissue but no sharp debridement was required today. The wound on the left lower extremity continues to look healthy and no sharp debridement was required today. Integumentary (Hair, Skin) No suspicious lesions. No crepitus or fluctuance. No peri-wound warmth or erythema. No masses.. Wound #2 status is Open. Original cause of wound was Blister. The wound is located on the Left,Distal,Lateral Lower Leg. The wound measures 1cm length x 0.9cm width x 0.1cm depth; 0.707cm^2 area and 0.071cm^3 volume. There is Fat Layer (Subcutaneous Tissue) Exposed exposed. There is no tunneling or undermining noted. There is a large amount of serous drainage noted. The wound margin is flat and intact. There is small (1-33%) granulation within the wound bed. There is a large (67-100%) amount of necrotic tissue within the wound bed including Eschar and Adherent Slough. The periwound skin appearance exhibited: Excoriation, Hemosiderin Staining. The periwound skin appearance did not exhibit: Callus, Crepitus, Induration, Rash, Scarring, Dry/Scaly, Maceration, Atrophie Blanche, Cyanosis, Ecchymosis, Mottled, Pallor, Rubor, Erythema. Periwound temperature was noted as No Abnormality. The periwound has tenderness on palpation. Wound #3 status is Open. Original cause of wound was Blister. The wound is located on the  Left,Medial Lower Leg. The wound measures 0.2cm length x 0.1cm width x 0.1cm depth; 0.016cm^2 area and 0.002cm^3 volume. The wound is limited to skin breakdown. There is no tunneling or undermining noted. There is a none present amount of drainage noted. The wound margin is indistinct and nonvisible. There is no granulation within the wound bed. There is a large (67-100%) amount of necrotic tissue within the wound bed including Eschar. The periwound skin appearance exhibited: Maceration. The periwound skin appearance did not exhibit: Callus, Crepitus, Excoriation, Induration, Rash, Scarring, Dry/Scaly, Atrophie Blanche, Cyanosis, Ecchymosis, Hemosiderin Staining, Mottled, Pallor, Rubor, Erythema. Periwound temperature was noted as No Abnormality. The periwound has tenderness on palpation. Wound #5 status is Healed - Epithelialized. Original cause of wound was Blister. The wound is located on the Left,Lateral Lower Leg. The wound measures 0cm length x 0cm width x 0cm depth; 0cm^2 area and 0cm^3 volume. Wound #6 status is Open. Original cause of wound was Trauma. The wound is located on the Right Lower Leg. The wound measures 1cm length x 1.4cm width x 0.1cm depth; 1.1cm^2 area and 0.11cm^3 volume. There is Fat Layer (Subcutaneous Tissue) Exposed exposed. There is no tunneling or undermining noted. There is a medium amount of serosanguineous drainage noted. The wound margin is indistinct and nonvisible. There is medium (34-66%) granulation within the wound bed. There is a medium (34-66%) amount of necrotic tissue within the wound bed. The periwound skin appearance exhibited: Excoriation, Ecchymosis. Zagal, Arissa D. 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 Today, after review I have recommended Prisma AG and a 3 layer Profore, to be applied and I  have asked her to see Dr. Dellia Nims this coming week. Elevation and exercise have also been discussed with her and she is awaiting the arrival of her lymphedema pumps and have instructed her to use these twice a day for an hour each Procedures Wound #2 Wound #2 is a Venous Leg Ulcer located on the Left,Distal,Lateral Lower Leg . There was a Three Layer Compression Therapy Procedure with a pre-treatment ABI of 1.1 by Cornell Barman, RN. Post procedure Diagnosis Wound #2: Same as Pre-Procedure Wound #3 Wound #3 is a Venous Leg Ulcer located on the Left,Medial Lower Leg . There was a Three Layer Compression Therapy Procedure with a pre-treatment ABI of 1.1 by Cornell Barman, RN. Post procedure Diagnosis Wound #3: Same as Pre-Procedure Wound #6 Wound #6 is a Diabetic Wound/Ulcer of the Lower Extremity located on the Right Lower Leg . There was a Three Layer Compression Therapy Procedure with a pre-treatment ABI of 1.1 by Cornell Barman, RN. Post procedure Diagnosis Wound #6: Same as Pre-Procedure Plan Spang, Breezy D. ([id number]) Wound Cleansing: Wound #2 Left,Distal,Lateral Lower Leg: Clean wound with Normal Saline. Wound #3 Left,Medial Lower Leg: Clean wound with Normal Saline. Wound #6 Right Lower Leg: Clean wound with Normal Saline. Anesthetic: Wound #2 Left,Distal,Lateral Lower Leg: Topical Lidocaine 4% cream applied to wound bed prior to debridement Wound #3 Left,Medial Lower Leg: Topical Lidocaine 4% cream applied to wound bed prior to debridement Wound #6 Right Lower Leg: Topical Lidocaine 4% cream applied to wound bed prior to debridement Primary Wound  Dressing: Wound #2 Left,Distal,Lateral Lower Leg: Prisma Ag Wound #3 Left,Medial Lower Leg: Prisma Ag Wound #6 Right Lower Leg: Prisma Ag Secondary Dressing: Wound #2 Left,Distal,Lateral Lower Leg: ABD pad Wound #3 Left,Medial Lower Leg: ABD pad Wound #6 Right Lower Leg: ABD pad Dressing Change Frequency: Wound #2 Left,Distal,Lateral Lower Leg: Change dressing every week Other: - Nurse Visit as needed Wound #3 Left,Medial Lower Leg: Change dressing every week Other: - Nurse Visit as needed Wound #6 Right Lower Leg: Change dressing every week Other: - Nurse Visit as needed Follow-up Appointments: Wound #2 Left,Distal,Lateral Lower Leg: Return Appointment in 1 week. - with Dr. Dellia Nims Wound #3 Left,Medial Lower Leg: Return Appointment in 1 week. - with Dr. Dellia Nims Wound #6 Right Lower Leg: Return Appointment in 1 week. - with Dr. Dellia Nims Edema Control: Wound #2 Left,Distal,Lateral Lower Leg: 3 Layer Compression System - Bilateral Wound #3 Left,Medial Lower Leg: 3 Layer Compression System - Bilateral Roehr, Elenore D. ([id number]) Wound #6 Right Lower Leg: 3 Layer Compression System - Bilateral I understand the patient has been having dressing changes twice a week with her compression wraps. Today, after review I have recommended Prisma AG and a 3 layer Profore, to be applied and I have asked her to see Dr. Dellia Nims this coming week. Elevation and exercise have also been discussed with her and she is awaiting the arrival of her lymphedema pumps and have instructed her to use these twice a day for an hour each Electronic Signature(s) Signed: 02/01/2017 10:20:59 AM By: Christin Fudge MD, FACS Entered By: Christin Fudge on 02/01/2017 10:20:59 Pomplun, Jacklin D. ([id number]) -------------------------------------------------------------------------------- SuperBill Details Patient Name: Wilhelmsen, Jasmeen D. Date of Service: 02/01/2017 Medical Record Number: [id number] Patient Account Number:  [id number] Date of Birth/Sex: 18-Nov-1951 (66 y.o. Female) Treating RN: Cornell Barman Primary Care Provider: Webb Silversmith Other Clinician: Referring Provider: Webb Silversmith Treating Provider/Extender: Christin Fudge Service Line: Outpatient Weeks in Treatment: 3 Diagnosis Coding ICD-10 Codes Code Description Chronic venous hypertension (  idiopathic) with ulcer and inflammation of left lower I87.332 extremity L97.221 Non-pressure chronic ulcer of left calf limited to breakdown of skin E11.42 Type 2 diabetes mellitus with diabetic polyneuropathy E11.622 Type 2 diabetes mellitus with other skin ulcer Facility Procedures CPT4: Description Modifier Quantity Code [id number] - WOUND CARE VISIT-LEV 3 EST PT 1 CPT4: [id number] BILATERAL: Application of multi-layer venous compression 1 system; leg (below knee), including ankle and foot. Electronic Signature(s) Signed: 02/01/2017 2:35:21 PM By: Gretta Cool RN, BSN, Kim RN, BSN Signed: 02/01/2017 3:38:42 PM By: Christin Fudge MD, FACS Entered By: Gretta Cool RN, BSN, Kim on 02/01/2017 10:08:03

## 2017-02-02 NOTE — Progress Notes (Signed)
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 Atrophie Blanche: No Crepitus: No Cyanosis: No Excoriation: Yes Ecchymosis: No Induration: No Erythema: No Rash: No Hemosiderin Staining: Yes Scarring: No Mottled: No Pallor: No Moisture Rubor: No No Abnormalities Noted: No Dry / Scaly: No Temperature / Pain Maceration: No Temperature: No Abnormality Tenderness on Palpation: Yes Wound Preparation Ulcer Cleansing: Rinsed/Irrigated with Saline Topical Anesthetic Applied: Other: lidocaine 4%, Treatment Notes Wound #2 (Left, Distal, Lateral Lower Leg) 1. Cleansed with: Clean wound with Normal Saline 2. Anesthetic Topical Lidocaine 4% cream to wound bed prior to debridement 3. Peri-wound Care: Other peri-wound care (specify in notes) 4. Dressing Applied: Prisma Ag 5. Secondary Dressing Applied ABD Pad 7. Secured with Tape 3 Layer Compression System - Left Lower Extremity Electronic Signature(s) Signed: 02/01/2017 2:35:21 PM By: Gretta Cool, RN, BSN, Kim RN, BSN Entered By: Gretta Cool, RN, BSN, Kim on 02/01/2017 09:14:24 Mellinger, Khadija [name]. ([id number]) -------------------------------------------------------------------------------- Wound Assessment Details Patient Name: Delsanto, Heather [name]. Date of Service: 02/01/2017 9:00 AM Medical Record Number: [id number] Patient Account Number: [id number] Date of Birth/Sex: 10/20/1951 (66 y.o. Female) Treating RN: Cornell Barman Primary Care Clair Bardwell: [name] [name] Other Clinician: Referring Avon Mergenthaler: [name] [name] Treating Velita Quirk/Extender: [name] [name] in Treatment: 3 Wound Status Wound Number: 3 Primary Venous Leg Ulcer Etiology: Wound Location: Left Lower Leg -  Medial Wound Open Wounding Event: Blister Status: Date Acquired: 11/06/2016 Comorbid Lymphedema, Congestive Heart Failure, Weeks Of Treatment: 3 History: Coronary Artery Disease, Hypertension, Clustered Wound: Yes Type II Diabetes, Rheumatoid Arthritis, Osteoarthritis, Confinement Anxiety Photos Wound Measurements Length: (cm) 0.2 Width: (cm) 0.1 Depth: (cm) 0.1 Clustered Quantity: 2 Area: (cm) 0.016 Volume: (cm) 0.002 % Reduction in Area: 99.4% % Reduction in Volume: 99.2% Epithelialization: Large (67-100%) Tunneling: No Undermining: No Wound Description Classification: Partial Thickness Diabetic Severity Earleen Newport): Grade 1 Wound Margin: Indistinct, nonvisib Exudate Amount: None Present Foul Odor After Cleansing: No Slough/Fibrino Yes le Wound Bed Granulation Amount: None Present (0%) Exposed Structure Necrotic Amount: Large (67-100%) Fascia Exposed: No Necrotic Quality: Eschar Fat Layer (Subcutaneous Tissue) Exposed:  No Tendon Exposed: No Muscle Exposed: No Joint Exposed: No Stribling, Mechel [name]. ([id number]) Bone Exposed: No Limited to Skin Breakdown Periwound Skin Texture Texture Color No Abnormalities Noted: No No Abnormalities Noted: No Callus: No Atrophie Blanche: No Crepitus: No Cyanosis: No Excoriation: No Ecchymosis: No Induration: No Erythema: No Rash: No Hemosiderin Staining: No Scarring: No Mottled: No Pallor: No Moisture Rubor: No No Abnormalities Noted: No Dry / Scaly: No Temperature / Pain Maceration: Yes Temperature: No Abnormality Tenderness on Palpation: Yes Wound Preparation Ulcer Cleansing: Rinsed/Irrigated with Saline, Other: soap and water, Topical Anesthetic Applied: Other: lidocaine 4%, Treatment Notes Wound #3 (Left, Medial Lower Leg) 1. Cleansed with: Clean wound with Normal Saline 2. Anesthetic Topical Lidocaine 4% cream to wound bed prior to debridement 3. Peri-wound Care: Other peri-wound care (specify in  notes) 4. Dressing Applied: Prisma Ag 5. Secondary Dressing Applied ABD Pad 7. Secured with Tape 3 Layer Compression System - Left Lower Extremity Electronic Signature(s) Signed: 02/01/2017 2:35:21 PM By: Gretta Cool, RN, BSN, Kim RN, BSN Entered By: Gretta Cool, RN, BSN, Kim on 02/01/2017 09:15:24 Worlds, Varina [name]. ([id number]) -------------------------------------------------------------------------------- Wound Assessment Details Patient Name: Yeldell, Rasheen [name]. Date of Service: 02/01/2017 9:00 AM Medical Record Number: [id number] Patient Account Number: [id number] Date of Birth/Sex: 1951/09/29 (66 y.o. Female) Treating RN: Cornell Barman Primary Care Virgil Lightner: [name] [name] Other Clinician: Referring Addaline Peplinski: [name] [name] Treating Chinmay Squier/Extender: [name] [name] in Treatment: 3 Wound Status Wound Number: 5 Primary Etiology: Venous Leg Ulcer Wound Location: Left, Lateral Lower Leg Wound Status: Healed - Epithelialized Wounding Event: Blister Date Acquired: 11/05/2016 Weeks Of Treatment: 3 Clustered Wound: Yes Wound Measurements Length: (cm) 0 % Reduction in Width: (cm) 0 % Reduction in Depth: (cm) 0 Area: (cm) 0 Volume: (cm) 0 Area: 100% Volume: 100% Wound Description Classification: Partial Thickness Periwound Skin Texture Texture Color No Abnormalities Noted: No No Abnormalities Noted: No Moisture No Abnormalities Noted: No Electronic Signature(s) Signed: 02/01/2017 2:35:21 PM By: Gretta Cool, RN, BSN, Kim RN, BSN Entered By: Gretta Cool, RN, BSN, Kim on 02/01/2017 09:08:17 Tomas, Taziyah [name]. ([id number]) -------------------------------------------------------------------------------- Wound Assessment Details Patient Name: Eischeid, Mykael [name]. Date of Service: 02/01/2017 9:00 AM Medical Record Number: [id number] Patient Account Number: [id number] Date of Birth/Sex: [birth year] (66 y.o. Female) Treating RN: Cornell Barman Primary Care Avry Monteleone: [name] [name] Other Clinician: Referring Karmello Abercrombie:  [name] [name] Treating Rossanna Spitzley/Extender: [name] [name] in Treatment: 3 Wound Status Wound Number: 6 Primary Diabetic Wound/Ulcer of the Lower Etiology: Extremity Wound Location: Right Lower Leg Wound Open Wounding Event: Trauma Status: Date Acquired: 01/03/2017 Comorbid Lymphedema, Congestive Heart Failure, Weeks Of Treatment: 0 History: Coronary Artery Disease, Hypertension, Clustered Wound: No Type II Diabetes, Rheumatoid Arthritis, Osteoarthritis, Confinement Anxiety Photos Wound Measurements Length: (cm) 1 % Reduction in A Width: (cm) 1.4 % Reduction in V Depth: (cm) 0.1 Epithelializatio Area: (cm) 1.1 Tunneling: Volume: (cm) 0.11 Undermining: rea: olume: n: Small (1-33%) No No Wound Description Classification: Grade 2 Wound Margin: Indistinct, nonvisible Exudate Amount: Medium Exudate Type: Serosanguineous Exudate Color: red, brown Foul Odor After Cleansing: No Slough/Fibrino Yes Wound Bed Granulation Amount: Medium (34-66%) Exposed Structure Necrotic Amount: Medium (34-66%) Fascia Exposed: No Fat Layer (Subcutaneous Tissue) Exposed: Yes Tendon Exposed: No Muscle Exposed: No Joint Exposed: No Savarese, Neidy [name]. ([id number]) Bone Exposed: No Periwound Skin Texture Texture Color No Abnormalities Noted: No No Abnormalities Noted: No Excoriation: Yes Ecchymosis: Yes Moisture No Abnormalities Noted: No Wound Preparation Ulcer Cleansing: Other: abx soap and water, Topical Anesthetic Applied: Other: lidocaine 4%, Treatment Notes Wound #6 (Right Lower  Leg) 1. Cleansed with: Cleanse wound with antibacterial soap and water 2. Anesthetic Topical Lidocaine 4% cream to wound bed prior to debridement 4. Dressing Applied: Prisma Ag 5. Secondary Dressing Applied ABD Pad 7. Secured with Tape 3 Layer Compression System - Right Lower Extremity Electronic Signature(s) Signed: 02/01/2017 2:35:21 PM By: Gretta Cool, RN, BSN, Kim RN, BSN Entered By: Gretta Cool, RN,  BSN, Kim on 02/01/2017 09:13:15 Polinski, [place] ([id number]) -------------------------------------------------------------------------------- [place] Details Patient Name: Wien, Lamisha [name]. Date of Service: 02/01/2017 9:00 AM Medical Record Number: [id number] Patient Account Number: [id number] Date of Birth/Sex: August 22, 1951 (66 y.o. Female) Treating RN: Cornell Barman Primary Care Darlinda Bellows: [name] [name] Other Clinician: Referring Kaion Tisdale: [name] [name] Treating Alessandro Griep/Extender: [name] [name] in Treatment: 3 Vital Signs Time Taken: 09:00 Temperature (F): 98.0 Height (in): 67 Pulse (bpm): 97 Weight (lbs): 338 Respiratory Rate (breaths/min): 16 Body Mass Index (BMI): 52.9 Blood Pressure (mmHg): 161/84 Reference Range: 80 - 120 mg / dl Notes Patient to follow-up with PCP regarding BP. MD notified. Electronic Signature(s) Signed: 02/01/2017 2:35:21 PM By: Gretta Cool, RN, BSN, Kim RN, BSN Entered By: Gretta Cool, RN, BSN, Kim on 02/01/2017 [id number]

## 2017-02-06 ENCOUNTER — Encounter: Payer: BLUE CROSS/BLUE SHIELD | Admitting: Internal Medicine

## 2017-02-06 DIAGNOSIS — E11622 Type 2 diabetes mellitus with other skin ulcer: Secondary | ICD-10-CM | POA: Diagnosis not present

## 2017-02-08 NOTE — Progress Notes (Signed)
Osberg, [name] [name] ([id number]) Visit Report for 02/06/2017 Chief Complaint Document Details Patient Name: [name] [name], [name] Date of Service: 02/06/2017 1:30 PM Medical Record Patient Account Number: [id number] Number: Treating RN: Ahmed Prima [birth year] (66 y.o. Other Clinician: Date of Birth/Sex: Female) Treating ROBSON, MICHAEL Primary Care Provider: Webb Silversmith Provider/Extender: G Referring Provider: Colon Flattery in Treatment: 4 Information Obtained from: Patient Chief Complaint chronic venous hypertension with ulcer formation left lower extremity 01/08/17; patient presents today with ulcerations on the left lateral lower leg Electronic Signature(s) Signed: 02/07/2017 5:01:50 PM By: Linton Ham MD Entered By: Linton Ham on 02/06/2017 14:51:22 Torbert, Kaniya D. ([id number]) -------------------------------------------------------------------------------- HPI Details Patient Name: [name], [name] Date of Service: 02/06/2017 1:30 PM Medical Record Patient Account Number: [id number] Number: Treating RN: Ahmed Prima 05-20-1951 (66 y.o. Other Clinician: Date of Birth/Sex: Female) Treating ROBSON, [place] Primary Care Provider: Webb Silversmith Provider/Extender: G Referring Provider: Webb Silversmith Weeks in Treatment: 4 History of Present Illness Location: left lower extremity Severity: improved since her initial hospitalization but stable since Duration: 6 weeks Context: began after her third treatment of Remicade for rheumatoid arthritis Modifying Factors: morbid obesity, chronic venous hypertension, lymphedema, diabetes mellitus type 2, methotrexate use, Remicade use, Associated Signs and Symptoms: pain and swelling left lower extremity HPI Description: she was seen last week where a 2 layer light compression system was applied. She removed that on Sunday because of itching. She states that she did see her primary care physician who has  prescribed her a stronger diuretic and she will begin that today. When she removed her dressing she applied some Neosporin and some gauze. There remains considerable concern about her ability to keep the wrap in place for a week she has been prescribed lymphedema pumps but she does not use those either. We will encourage her to use those twice a day for an hour each time. These were prescribed to her by Dr. Hinton Lovely READMISSION 01/04/17; this is a patient who is a type II diabetic on oral agents. I note she was seen in this clinic 2-3 years ago. I was not involved in her care at that point. She tells [name] that she is had ulcers on her lateral left lower extremity since just before Christmas. There was no obvious precipitant to this. She apparently has been on long-term suppressive penicillin prescribed by Dr. 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 She has noted increasing erythema along with all of this and I think this is what prompted her to come in today. She does not have a known history of PAD or neuropathy ABI in this clinic was 1.06 on the left. The patient has not been requiring any compression on her legs although she does have compression stockings at home as well as external compression pumps. She has been using neither of these. She has not been systemically unwell the patient is also known to the local vein and vascular group. Apparently her external compression pumps were previously prescribed by Dr. Delana Meyer. She has had apparent bilateral ablations done in the past although I have none of these records. 01/15/17; the patient returns for rewrap of her left leg last week and reports that was put on too tight, she had to remove it on Sunday. She states this actually occurred her even though the previous 3 lateral wrap she was able to tolerate well. I don't think there  is been  much in the way of expansion of the erythema on the lateral left leg lateral left heel and lateral left foot. As mentioned previously I think this is chronic venous insufficiency and not really an active cellulitis. She has 2 small open areas in the lower left calf 01/22/17; the patient complains of really generalized pain. She has rheumatoid arthritis as well as fibromyalgia. She had to take the wrap off yesterday. She has small open areas on the lateral left heel and Vanwyk, [name] ([id number]) foot. 02/01/2017 -- the patient is seen by me today as she's had compression wraps for 10 days and I understand she did not realize she had to come back to see [name]. She did see Dr. Delana Meyer yesterday and his notes are not in, but the patient tells [name] that he is going to recommend a lymphedema pump and has not recommended any further venous workup or intervention. 02/06/17; the patient went to see Dr. Hinton Lovely of vascular surgery although I don't remember specifically being involved with this discussion. He felt she had chronic venous insufficiency without surgery or intervention necessary at this time. Also noted lymphedema and type 2 diabetes. In the meantime she did not tolerate the wrap on the right leg stating that the small wound anteriorly" burns like fire" Electronic Signature(s) Signed: 02/07/2017 5:01:50 PM By: Linton Ham MD Entered By: Linton Ham on 02/06/2017 14:52:54 Batterman, Joyelle D. ([id number]) -------------------------------------------------------------------------------- Physical Exam Details Patient Name: [name], [name] Date of Service: 02/06/2017 1:30 PM Medical Record Patient Account Number: [id number] Number: Treating RN: Ahmed Prima [birth year] (66 y.o. Other Clinician: Date of Birth/Sex: Female) Treating ROBSON, MICHAEL Primary Care Provider: Webb Silversmith Provider/Extender: G Referring Provider: Webb Silversmith Weeks in Treatment: 4 Constitutional Patient  is hypertensive.. Pulse regular and within target range for patient.[name] Respirations regular, non-labored and within target range.. Temperature is normal and within the target range for the patient.. Patient's appearance is neat and clean. Appears in no acute distress. Well nourished and well developed.. Eyes Conjunctivae clear. No discharge.[name] Respiratory Respiratory effort is easy and symmetric bilaterally. Rate is normal at rest and on room air.. Cardiovascular Pedal pulses palpable and strong bilaterally.. Edema present in both extremities. Chronic venous insufficiency with lymphedema. Lymphatic None palpable in the popliteal or inguinal area. Psychiatric No evidence of depression, anxiety, or agitation. Calm, cooperative, and communicative. Appropriate interactions and affect.. Notes Wound exam; she has a stable, small granulated wound on the left anterior leg right medial leg. The area on the right anterior leg that is causing her so much problem also looks fairly benign had some surface slough that I removed with gauze. The wound looks stable there is no evidence of surrounding infection, I can't really see a good reason for this Electronic Signature(s) Signed: 02/07/2017 5:01:50 PM By: Linton Ham MD Entered By: Linton Ham on 02/06/2017 14:54:43 Ewalt, Lauralyn D. ([id number]) -------------------------------------------------------------------------------- Physician Orders Details Patient Name: Reyburn, Kinjal D. Date of Service: 02/06/2017 1:30 PM Medical Record Patient Account Number: [id number] Number: Treating RN: Ahmed Prima March 06, 1951 (65 y.o. Other Clinician: Date of Birth/Sex: Female) Treating ROBSON, MICHAEL Primary Care Provider: Webb Silversmith Provider/Extender: G Referring Provider: Colon Flattery in Treatment: 4 Verbal / Phone Orders: Yes ClinicianCarolyne Fiscal, Debi Read Back and Verified: Yes Diagnosis Coding Wound Cleansing Wound #2  Left,Distal,Lateral Lower Leg o Clean wound with Normal Saline. Wound #3 Left,Medial Lower Leg o Clean wound with Normal Saline. Wound #6 Right Lower Leg o Clean wound with Normal  Saline. Anesthetic Wound #2 Left,Distal,Lateral Lower Leg o Topical Lidocaine 4% cream applied to wound bed prior to debridement Wound #3 Left,Medial Lower Leg o Topical Lidocaine 4% cream applied to wound bed prior to debridement Wound #6 Right Lower Leg o Topical Lidocaine 4% cream applied to wound bed prior to debridement Primary Wound Dressing Wound #2 Left,Distal,Lateral Lower Leg o Prisma Ag Wound #3 Left,Medial Lower Leg o Prisma Ag Wound #6 Right Lower Leg o Dry Gauze o Boardered Foam Dressing Secondary Dressing Wound #2 Left,Distal,Lateral Lower Leg o ABD pad Erven, Pamlea D. ([id number]) Wound #3 Left,Medial Lower Leg o ABD pad Wound #6 Right Lower Leg o ABD pad Dressing Change Frequency Wound #2 Left,Distal,Lateral Lower Leg o Change dressing every week o Other: - Nurse Visit as needed Wound #3 Left,Medial Lower Leg o Change dressing every week o Other: - Nurse Visit as needed Wound #6 Right Lower Leg o Change dressing every week o Other: - Nurse Visit as needed Follow-up Appointments Wound #2 Left,Distal,Lateral Lower Leg o Return Appointment in 1 week. - with Dr. Dellia Nims Wound #3 Left,Medial Lower Leg o Return Appointment in 1 week. - with Dr. Dellia Nims Wound #6 Right Lower Leg o Return Appointment in 1 week. - with Dr. Dellia Nims Edema Control Wound #2 Left,Distal,Lateral Lower Leg o 3 Layer Compression System - Bilateral Wound #3 Left,Medial Lower Leg o 3 Layer Compression System - Bilateral Wound #6 Right Lower Leg o 3 Layer Compression System - Bilateral Electronic Signature(s) Signed: 02/06/2017 4:45:11 PM By: Alric Quan Signed: 02/07/2017 5:01:50 PM By: Linton Ham MD Entered By: Alric Quan on 02/06/2017  14:06:27 Digiulio, Shatora D. ([id number]) -------------------------------------------------------------------------------- Problem List Details Patient Name: Miles, Glenice D. Date of Service: 02/06/2017 1:30 PM Medical Record Patient Account Number: [id number] Number: Treating RN: Ahmed Prima 02-12-1951 (65 y.o. 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 ([id number]) -------------------------------------------------------------------------------- Progress Note Details Patient Name: Clary, Regene D. Date of Service: 02/06/2017 1:30 PM Medical Record Patient Account Number: [id number] Number: Treating RN: Ahmed Prima 09/23/1951 (65 y.o. Other Clinician: Date of Birth/Sex: Female) Treating ROBSON, MICHAEL Primary Care Provider: Webb Silversmith Provider/Extender: G Referring Provider: Colon Flattery in Treatment: 4 Subjective Chief Complaint Information obtained from Patient chronic venous hypertension with ulcer formation left lower extremity 01/08/17; patient presents today  with ulcerations on the left lateral lower leg History of Present Illness (HPI) The following HPI elements were documented for the patient's wound: Location: left lower extremity Severity: improved since her initial hospitalization but stable since Duration: 6 weeks Context: began after her third treatment of Remicade for rheumatoid arthritis Modifying Factors: morbid obesity, chronic venous hypertension, lymphedema, diabetes mellitus type 2, methotrexate use, Remicade use, Associated Signs and Symptoms: pain and swelling left lower extremity she was seen last week where a 2 layer  light compression system was applied. She removed that on Sunday because of itching. She states that she did see her primary care physician who has prescribed her a stronger diuretic and she will begin that today. When she removed her dressing she applied some Neosporin and some gauze. There remains considerable concern about her ability to keep the wrap in place for a week she has been prescribed lymphedema pumps but she does not use those either. We will encourage her to use those twice a day for an hour each time. These were prescribed to her by Dr. Hinton Lovely READMISSION 01/04/17; this is a patient who is a type II diabetic on oral agents. I note she was seen in this clinic 2-3 years ago. I was not involved in her care at that point. She tells [name] that she is had ulcers on her lateral left lower extremity since just before Christmas. There was no obvious precipitant to this. She apparently has been on long-term suppressive penicillin prescribed by Dr. 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 She has noted increasing erythema along with all of this and I think this is what prompted her to come in today. She does not have a known history of PAD or neuropathy ABI in this clinic was 1.06 on the left. The patient  has not been requiring any compression on her legs although she does have compression stockings at home as well as external compression pumps. She has been using neither of these. She has not been systemically unwell the patient is also known to the local vein and vascular group. Apparently her external compression pumps [place], Nori D. ([id number]) were previously prescribed by Dr. Delana Meyer. She has had apparent bilateral ablations done in the past although I have none of these records. 01/15/17; the patient returns for rewrap of her left leg last week and reports that was put on too tight, she had to remove it on Sunday. She states this actually occurred her even though the previous 3 lateral wrap she was able to tolerate well. I don't think there is been much in the way of expansion of the erythema on the lateral left leg lateral left heel and lateral left foot. As mentioned previously I think this is chronic venous insufficiency and not really an active cellulitis. She has 2 small open areas in the lower left calf 01/22/17; the patient complains of really generalized pain. She has rheumatoid arthritis as well as fibromyalgia. She had to take the wrap off yesterday. She has small open areas on the lateral left heel and foot. 02/01/2017 -- the patient is seen by me today as she's had compression wraps for 10 days and I understand she did not realize she had to come back to see [name]. She did see Dr. Delana Meyer yesterday and his notes are not in, but the patient tells [name] that he is going to recommend a lymphedema pump and has not recommended any further venous workup or intervention. 02/06/17; the patient went to see Dr. Hinton Lovely of vascular surgery although I don't remember specifically being involved with this discussion. He felt she had chronic venous insufficiency without surgery or intervention necessary at this time. Also noted lymphedema and type 2 diabetes. In the meantime she did not tolerate  the wrap on the right leg stating that the small wound anteriorly" burns like fire" Objective Constitutional Patient is hypertensive.. Pulse regular  and within target range for patient.[name] Respirations regular, non-labored and within target range.. Temperature is normal and within the target range for the patient.. Patient's appearance is neat and clean. Appears in no acute distress. Well nourished and well developed.. Vitals Time Taken: 1:34 PM, Height: 67 in, Weight: 338 lbs, BMI: 52.9, Temperature: 97.8 F, Pulse: 86 bpm, Respiratory Rate: 18 breaths/min, Blood Pressure: 146/89 mmHg. Eyes Conjunctivae clear. No discharge.[name] Respiratory Respiratory effort is easy and symmetric bilaterally. Rate is normal at rest and on room air.. Cardiovascular Pedal pulses palpable and strong bilaterally.. Edema present in both extremities. Chronic venous insufficiency with lymphedema. Lymphatic None palpable in the popliteal or inguinal area. Psychiatric Cassel, Daleysa D. ([id number]) No evidence of depression, anxiety, or agitation. Calm, cooperative, and communicative. Appropriate interactions and affect.. General Notes: Wound exam; she has a stable, small granulated wound on the left anterior leg right medial leg. The area on the right anterior leg that is causing her so much problem also looks fairly benign had some surface slough that I removed with gauze. The wound looks stable there is no evidence of surrounding infection, I can't really see a good reason for this Integumentary (Hair, Skin) Wound #2 status is Open. Original cause of wound was Blister. The wound is located on the Left,Distal,Lateral Lower Leg. The wound measures 1.9cm length x 0.6cm width x 0.1cm depth; 0.895cm^2 area and 0.09cm^3 volume. There is Fat Layer (Subcutaneous Tissue) Exposed exposed. There is no tunneling or undermining noted. There is a large amount of serous drainage noted. The wound margin is flat and intact. There  is large (67-100%) red granulation within the wound bed. There is no necrotic tissue within the wound bed. The periwound skin appearance exhibited: Excoriation, Hemosiderin Staining. The periwound skin appearance did not exhibit: Callus, Crepitus, Induration, Rash, Scarring, Dry/Scaly, Maceration, Atrophie Blanche, Cyanosis, Ecchymosis, Mottled, Pallor, Rubor, Erythema. Periwound temperature was noted as No Abnormality. The periwound has tenderness on palpation. Wound #3 status is Open. Original cause of wound was Blister. The wound is located on the Left,Medial Lower Leg. The wound measures 0.2cm length x 0.1cm width x 0.1cm depth; 0.016cm^2 area and 0.002cm^3 volume. The wound is limited to skin breakdown. There is no tunneling or undermining noted. There is a none present amount of drainage noted. The wound margin is indistinct and nonvisible. There is no granulation within the wound bed. There is a large (67-100%) amount of necrotic tissue within the wound bed including Eschar. The periwound skin appearance exhibited: Maceration. The periwound skin appearance did not exhibit: Callus, Crepitus, Excoriation, Induration, Rash, Scarring, Dry/Scaly, Atrophie Blanche, Cyanosis, Ecchymosis, Hemosiderin Staining, Mottled, Pallor, Rubor, Erythema. Periwound temperature was noted as No Abnormality. The periwound has tenderness on palpation. Wound #6 status is Open. Original cause of wound was Trauma. The wound is located on the Right Lower Leg. The wound measures 1cm length x 1.4cm width x 0.1cm depth; 1.1cm^2 area and 0.11cm^3 volume. There is Fat Layer (Subcutaneous Tissue) Exposed exposed. There is no tunneling or undermining noted. There is a medium amount of serosanguineous drainage noted. The wound margin is indistinct and nonvisible. There is no granulation within the wound bed. There is a large (67-100%) amount of necrotic tissue within the wound bed including Eschar. The periwound skin  appearance exhibited: Excoriation, Ecchymosis. Assessment Active Problems ICD-10 I87.332 - Chronic venous hypertension (idiopathic) with ulcer and inflammation of left lower extremity L97.221 - Non-pressure chronic ulcer of left calf limited to breakdown of skin E11.42 - Type 2 diabetes  mellitus with diabetic polyneuropathy E11.622 - Type 2 diabetes mellitus with other skin ulcer Woolworth, Charmayne D. 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 Wound #3 Left,Medial Lower Leg: Clean wound with Normal Saline. Wound #6 Right Lower Leg: Clean wound with Normal Saline. Anesthetic: Wound #2 Left,Distal,Lateral Lower Leg: Topical Lidocaine 4% cream applied to wound bed prior to debridement Wound #3 Left,Medial Lower Leg: Topical Lidocaine 4% cream applied to wound bed prior to debridement Wound #6 Right Lower Leg: Topical Lidocaine 4% cream applied to wound bed prior to debridement Primary Wound Dressing: Wound #2 Left,Distal,Lateral Lower Leg: Prisma Ag Wound #3 Left,Medial Lower Leg: Prisma Ag Wound #6 Right Lower Leg: Dry Gauze Boardered Foam Dressing Secondary Dressing: Wound #2 Left,Distal,Lateral Lower Leg: ABD pad Wound #3 Left,Medial Lower Leg: ABD pad Wound #6 Right Lower Leg: ABD pad Dressing Change Frequency: Wound #2 Left,Distal,Lateral Lower Leg: Change dressing every week Other: - Nurse Visit as needed Wound #3 Left,Medial Lower Leg: Change dressing every week Other: - Nurse Visit as needed Wound #6 Right Lower Leg: Change dressing every week Other: - Nurse Visit as needed Follow-up Appointments: Crescenzo, Mykelti D. ([id number]) Wound #2 Left,Distal,Lateral Lower Leg: Return Appointment in 1 week. - with Dr. Dellia Nims Wound #3 Left,Medial Lower Leg: Return Appointment in 1 week. - with Dr.  Dellia Nims Wound #6 Right Lower Leg: Return Appointment in 1 week. - with Dr. Dellia Nims Edema Control: Wound #2 Left,Distal,Lateral Lower Leg: 3 Layer Compression System - Bilateral Wound #3 Left,Medial Lower Leg: 3 Layer Compression System - Bilateral Wound #6 Right Lower Leg: 3 Layer Compression System - Bilateral 1 we will continue prisam to all wounds 2 foam and her own stocking on the right 3 profore lite on the left leg 4 i have no explanation for the pain she describes on the right anterior leg wound which is small and appears benign Electronic Signature(s) Signed: 02/07/2017 3:12:58 PM By: Gretta Cool RN, BSN, Kim RN, BSN Signed: 02/07/2017 5:01:50 PM By: Linton Ham MD Entered By: Gretta Cool, RN, BSN, Kim on 02/07/2017 15:12:57 Crysler, Dilynn D. ([id number]) -------------------------------------------------------------------------------- SuperBill Details Patient Name: Burcher, Niyana D. Date of Service: 02/06/2017 Medical Record Patient Account Number: [id number] Number: Treating RN: Ahmed Prima [birth year] (66 y.o. Other Clinician: Date of Birth/Sex: Female) Treating ROBSON, MICHAEL Primary Care Provider: Webb Silversmith Provider/Extender: G Referring Provider: Webb Silversmith Service Line: Outpatient Weeks in Treatment: 4 Diagnosis Coding ICD-10 Codes Code Description Chronic venous hypertension (idiopathic) with ulcer and inflammation of left lower I87.332 extremity L97.221 Non-pressure chronic ulcer of left calf limited to breakdown of skin E11.42 Type 2 diabetes mellitus with diabetic polyneuropathy E11.622 Type 2 diabetes mellitus with other skin ulcer L97.212 Non-pressure chronic ulcer of right calf with fat layer exposed Facility Procedures CPT4: Description Modifier Quantity Code [id number] (Facility Use Only) [phone] - APPLY Hoven LT 1 LEG Physician Procedures CPT4: Description Modifier Quantity Code [id number] - WC PHYS LEVEL 3 - EST PT 1 ICD-10  Description Diagnosis I87.332 Chronic venous hypertension (idiopathic) with ulcer and inflammation of left lower extremity L97.221 Non-pressure chronic ulcer of  left calf limited to breakdown of skin Electronic Signature(s) Signed: 02/06/2017 4:45:11 PM By: Alric Quan Signed: 02/07/2017 5:01:50 PM By: Linton Ham MD Entered By: Alric Quan on 02/06/2017 15:30:34
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
## 2017-02-13 ENCOUNTER — Encounter: Payer: BLUE CROSS/BLUE SHIELD | Admitting: Internal Medicine

## 2017-02-13 DIAGNOSIS — E11622 Type 2 diabetes mellitus with other skin ulcer: Secondary | ICD-10-CM | POA: Diagnosis not present
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
## 2017-02-14 NOTE — Progress Notes (Signed)
[name] [name], [name] [name] ([id number]) Visit Report for 02/13/2017 Chief Complaint Document Details Patient Name: [name], [name] Date of Service: 02/13/2017 10:15 AM Medical Record Patient Account Number: [id number] Number: Treating RN: Montey Hora 10/26/1951 (65 y.o. Other Clinician: Date of Birth/Sex: Female) Treating ROBSON, MICHAEL Primary Care Provider: Webb Silversmith Provider/Extender: G Referring Provider: Colon Flattery in Treatment: 5 Information Obtained from: Patient Chief Complaint chronic venous hypertension with ulcer formation left lower extremity 01/08/17; patient presents today with ulcerations on the left lateral lower leg Electronic Signature(s) Signed: 02/13/2017 4:24:49 PM By: Linton Ham MD Entered By: Linton Ham on 02/13/2017 12:04:26 [name], [name] ([id number]) -------------------------------------------------------------------------------- HPI Details Patient Name: [name], [name] Date of Service: 02/13/2017 10:15 AM Medical Record Patient Account Number: [id number] Number: Treating RN: Montey Hora May 03, 1951 (65 y.o. Other Clinician: Date of Birth/Sex: Female) Treating ROBSON, [place] Primary Care Provider: Webb Silversmith Provider/Extender: G Referring Provider: Webb Silversmith Weeks in Treatment: 5 History of Present Illness Location: left lower extremity Severity: improved since her initial hospitalization but stable since Duration: 6 weeks Context: began after her third treatment of Remicade for rheumatoid arthritis Modifying Factors: morbid obesity, chronic venous hypertension, lymphedema, diabetes mellitus type 2, methotrexate use, Remicade use, Associated Signs and Symptoms: pain and swelling left lower extremity HPI Description: she was seen last week where a 2 layer light compression system was applied. She removed that on Sunday because of itching. She states that she did see her primary care physician who has  prescribed her a stronger diuretic and she will begin that today. When she removed her dressing she applied some Neosporin and some gauze. There remains considerable concern about her ability to keep the wrap in place for a week she has been prescribed lymphedema pumps but she does not use those either. We will encourage her to use those twice a day for an hour each time. These were prescribed to her by Dr. Hinton Lovely READMISSION 01/04/17; this is a patient who is a type II diabetic on oral agents. I note she was seen in this clinic 2-3 years ago. I was not involved in her care at that point. She tells [name] that she is had ulcers on her lateral left lower extremity since just before Christmas. There was no obvious precipitant to this. She apparently has been on long-term suppressive penicillin prescribed by Dr. 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 She has noted increasing erythema along with all of this and I think this is what prompted her to come in today. She does not have a known history of PAD or neuropathy ABI in this clinic was 1.06 on the left. The patient has not been requiring any compression on her legs although she does have compression stockings at home as well as external compression pumps. She has been using neither of these. She has not been systemically unwell the patient is also known to the local vein and vascular group. Apparently her external compression pumps were previously prescribed by Dr. Delana Meyer. She has had apparent bilateral ablations done in the past although I have none of these records. 01/15/17; the patient returns for rewrap of her left leg last week and reports that was put on too tight, she had to remove it on Sunday. She states this actually occurred her even though the previous 3 lateral wrap she was able to tolerate well. I don't think there  is been  much in the way of expansion of the erythema on the lateral left leg lateral left heel and lateral left foot. As mentioned previously I think this is chronic venous insufficiency and not really an active cellulitis. She has 2 small open areas in the lower left calf 01/22/17; the patient complains of really generalized pain. She has rheumatoid arthritis as well as fibromyalgia. She had to take the wrap off yesterday. She has small open areas on the lateral left heel and Bialecki, [name] ([id number]) foot. 02/01/2017 -- the patient is seen by me today as she's had compression wraps for 10 days and I understand she did not realize she had to come back to see [name]. She did see Dr. Delana Meyer yesterday and his notes are not in, but the patient tells [name] that he is going to recommend a lymphedema pump and has not recommended any further venous workup or intervention. 02/06/17; the patient went to see Dr. Hinton Lovely of vascular surgery although I don't remember specifically being involved with this discussion. He felt she had chronic venous insufficiency without surgery or intervention necessary at this time. Also noted lymphedema and type 2 diabetes. In the meantime she did not tolerate the wrap on the right leg stating that the small wound anteriorly" burns like fire" 02/13/17; the lesions on the left lateral and left medial leg have closed over. Still looks a little vulnerable on the lateral aspect on the left Right; still an open area here but smaller and appears to be well granulated. Using Silver Collegen. She uses her own wrap on the right leg last week and I think week and continue that on both legs this week Electronic Signature(s) Signed: 02/13/2017 4:24:49 PM By: Linton Ham MD Entered By: Linton Ham on 02/13/2017 12:08:20 [name], [name] ([id number]) -------------------------------------------------------------------------------- Physical Exam Details Patient Name: Partin, [name] Date of  Service: 02/13/2017 10:15 AM Medical Record Patient Account Number: [id number] Number: Treating RN: Montey Hora 1951-06-30 (65 y.o. Other Clinician: Date of Birth/Sex: Female) Treating ROBSON, MICHAEL Primary Care Provider: Webb Silversmith Provider/Extender: G Referring Provider: Webb Silversmith Weeks in Treatment: 5 Constitutional Patient is hypertensive.. Pulse regular and within target range for patient.[name] Respirations regular, non-labored and within target range.. Temperature is normal and within the target range for the patient.. Patient's appearance is neat and clean. Appears in no acute distress. Well nourished and well developed.[name] Respiratory Respiratory effort is easy and symmetric bilaterally. Rate is normal at rest and on room air.. Cardiovascular Pedal pulses palpable and strong bilaterally.. Edema present in both extremities. Edema is well-controlled on both sides including the right where her stocking was. Lymphatic None palpable in the popliteal or inguinal area. Psychiatric No evidence of depression, anxiety, or agitation. Calm, cooperative, and communicative. Appropriate interactions and affect.. Notes Wound exam; she has a stable small granulated wound on the anterior right medial leg. This is smaller and healthy no debridement is required. On the left lateral leg there is epithelialization over the wound however it looks a little nonverbal Electronic Signature(s) Signed: 02/13/2017 4:24:49 PM By: Linton Ham MD Entered By: Linton Ham on 02/13/2017 12:10:39 Lohmeyer, [name] ([id number]) -------------------------------------------------------------------------------- Physician Orders Details Patient Name: Yim, [name] Date of Service: 02/13/2017 10:15 AM Medical Record Patient Account Number: [id number] Number: Treating RN: Montey Hora Nov 28, 1951 (65 y.o. Other Clinician: Date of Birth/Sex: Female) Treating ROBSON,  MICHAEL Primary Care Provider: Webb Silversmith Provider/Extender: G Referring Provider: Colon Flattery in Treatment: 5 Verbal / Phone  Orders: No Diagnosis Coding Wound Cleansing Wound #2 Left,Distal,Lateral Lower Leg o Clean wound with Normal Saline. Wound #6 Right Lower Leg o Clean wound with Normal Saline. Anesthetic Wound #2 Left,Distal,Lateral Lower Leg o Topical Lidocaine 4% cream applied to wound bed prior to debridement Wound #6 Right Lower Leg o Topical Lidocaine 4% cream applied to wound bed prior to debridement Primary Wound Dressing Wound #6 Right Lower Leg o Prisma Ag Secondary Dressing Wound #2 Left,Distal,Lateral Lower Leg o Boardered Foam Dressing Wound #6 Right Lower Leg o Dry Gauze o Boardered Foam Dressing Dressing Change Frequency Wound #2 Left,Distal,Lateral Lower Leg o Change dressing every day. Wound #6 Right Lower Leg o Change dressing every day. Follow-up Appointments Lapinsky, Mariadel D. ([id number]) Wound #2 Left,Distal,Lateral Lower Leg o Return Appointment in 1 week. - with Dr. Dellia Nims Wound #6 Right Lower Leg o Return Appointment in 1 week. - with Dr. Dellia Nims Edema Control Wound #2 Left,Distal,Lateral Lower Leg o Patient to wear own compression stockings Wound #6 Right Lower Leg o Patient to wear own compression stockings Electronic Signature(s) Signed: 02/13/2017 4:24:49 PM By: Linton Ham MD Signed: 02/13/2017 5:29:48 PM By: Montey Hora Entered By: Montey Hora on 02/13/2017 10:58:47 Ryce, [name] ([id number]) -------------------------------------------------------------------------------- Problem List Details Patient Name: Haigh, [name] Date of Service: 02/13/2017 10:15 AM Medical Record Patient Account Number: [id number] Number: Treating RN: Montey Hora 08-25-1951 (65 y.o. 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 ([id number]) -------------------------------------------------------------------------------- Progress Note Details Patient Name: Mellone, [name] Date of Service: 02/13/2017 10:15 AM Medical Record Patient Account Number: [id number] Number: Treating RN: Montey Hora 07/16/1951 (65 y.o. Other Clinician: Date of Birth/Sex: Female) Treating ROBSON, MICHAEL Primary Care Provider: Webb Silversmith Provider/Extender: G Referring Provider: Colon Flattery in Treatment: 5 Subjective Chief Complaint Information obtained from Patient chronic venous hypertension with ulcer formation left lower extremity 01/08/17; patient presents today with ulcerations on the left lateral lower leg History of Present Illness (HPI) The following HPI elements were documented for the patient's wound: Location: left lower extremity Severity: improved since her initial hospitalization but stable since Duration: 6 weeks Context: began after her third treatment of Remicade for rheumatoid arthritis Modifying Factors: morbid obesity, chronic venous hypertension, lymphedema, diabetes  mellitus type 2, methotrexate use, Remicade use, Associated Signs and Symptoms: pain and swelling left lower extremity she was seen last week where a 2 layer light compression system was applied. She removed that on Sunday because of itching. She states that she did see her primary care physician who has prescribed her a stronger diuretic and she will begin that today. When she removed her dressing she applied some Neosporin and some gauze. There remains considerable concern about her ability to keep the wrap in place for a week she has been prescribed lymphedema pumps but  she does not use those either. We will encourage her to use those twice a day for an hour each time. These were prescribed to her by Dr. Hinton Lovely READMISSION 01/04/17; this is a patient who is a type II diabetic on oral agents. I note she was seen in this clinic 2-3 years ago. I was not involved in her care at that point. She tells [name] that she is had ulcers on her lateral left lower extremity since just before Christmas. There was no obvious precipitant to this. She apparently has been on long-term suppressive penicillin prescribed by Dr. 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 She has noted increasing erythema along with all of this and I think this is what prompted her to come in today. She does not have a known history of PAD or neuropathy ABI in this clinic was 1.06 on the left. The patient has not been requiring any compression on her legs although she does have compression stockings at home as well as external compression pumps. She has been using neither of these. She has not been systemically unwell the patient is also known to the local vein and vascular group. Apparently her external compression pumps Holifield, Kassia D. ([id number]) were previously prescribed by Dr. Delana Meyer. She has had apparent bilateral  ablations done in the past although I have none of these records. 01/15/17; the patient returns for rewrap of her left leg last week and reports that was put on too tight, she had to remove it on Sunday. She states this actually occurred her even though the previous 3 lateral wrap she was able to tolerate well. I don't think there is been much in the way of expansion of the erythema on the lateral left leg lateral left heel and lateral left foot. As mentioned previously I think this is chronic venous insufficiency and not really an active cellulitis. She has 2 small open areas in the lower left calf 01/22/17; the patient complains of really generalized pain. She has rheumatoid arthritis as well as fibromyalgia. She had to take the wrap off yesterday. She has small open areas on the lateral left heel and foot. 02/01/2017 -- the patient is seen by me today as she's had compression wraps for 10 days and I understand she did not realize she had to come back to see [name]. She did see Dr. Delana Meyer yesterday and his notes are not in, but the patient tells [name] that he is going to recommend a lymphedema pump and has not recommended any further venous workup or intervention. 02/06/17; the patient went to see Dr. Hinton Lovely of vascular surgery although I don't remember specifically being involved with this discussion. He felt she had chronic venous insufficiency without surgery or intervention necessary at this time. Also noted lymphedema and type 2 diabetes. In the meantime she did not tolerate the wrap on the right leg stating that the small wound anteriorly" burns like fire" 02/13/17; the lesions on the left lateral and left medial leg have closed over. Still looks a little vulnerable on the lateral aspect on the left Right; still an open area here but smaller and appears to be well granulated. Using Silver Collegen. She uses her own wrap on the right leg last week and I think week and continue that on both legs  this week Objective Constitutional Patient is hypertensive.. Pulse regular and within target range for  patient.[name] Respirations regular, non-labored and within target range.. Temperature is normal and within the target range for the patient.. Patient's appearance is neat and clean. Appears in no acute distress. Well nourished and well developed.. Vitals Time Taken: 10:25 AM, Height: 67 in, Weight: 338 lbs, BMI: 52.9, Temperature: 97.7 F, Pulse: 74 bpm, Respiratory Rate: 18 breaths/min, Blood Pressure: 147/57 mmHg. Respiratory Respiratory effort is easy and symmetric bilaterally. Rate is normal at rest and on room air.. Cardiovascular Pedal pulses palpable and strong bilaterally.. Edema present in both extremities. Edema is well-controlled on both sides including the right where her stocking was. Lymphatic None palpable in the popliteal or inguinal area. Dehart, [name] ([id number]) Psychiatric No evidence of depression, anxiety, or agitation. Calm, cooperative, and communicative. Appropriate interactions and affect.. General Notes: Wound exam; she has a stable small granulated wound on the anterior right medial leg. This is smaller and healthy no debridement is required. On the left lateral leg there is epithelialization over the wound however it looks a little nonverbal Integumentary (Hair, Skin) Wound #2 status is Open. Original cause of wound was Blister. The wound is located on the Left,Distal,Lateral Lower Leg. The wound measures 0.4cm length x 0.2cm width x 0.1cm depth; 0.063cm^2 area and 0.006cm^3 volume. There is Fat Layer (Subcutaneous Tissue) Exposed exposed. There is no tunneling or undermining noted. There is a large amount of serous drainage noted. The wound margin is flat and intact. There is large (67-100%) red granulation within the wound bed. There is no necrotic tissue within the wound bed. The periwound skin appearance exhibited: Excoriation, Hemosiderin Staining.  The periwound skin appearance did not exhibit: Callus, Crepitus, Induration, Rash, Scarring, Dry/Scaly, Maceration, Atrophie Blanche, Cyanosis, Ecchymosis, Mottled, Pallor, Rubor, Erythema. Periwound temperature was noted as No Abnormality. The periwound has tenderness on palpation. Wound #3 status is Healed - Epithelialized. Original cause of wound was Blister. The wound is located on the Left,Medial Lower Leg. The wound measures 0cm length x 0cm width x 0cm depth; 0cm^2 area and 0cm^3 volume. Wound #6 status is Open. Original cause of wound was Trauma. The wound is located on the Right Lower Leg. The wound measures 1cm length x 1.2cm width x 0.1cm depth; 0.942cm^2 area and 0.094cm^3 volume. There is Fat Layer (Subcutaneous Tissue) Exposed exposed. There is no tunneling or undermining noted. There is a medium amount of serosanguineous drainage noted. The wound margin is indistinct and nonvisible. There is large (67-100%) red granulation within the wound bed. There is a small (1-33%) amount of necrotic tissue within the wound bed including Eschar. The periwound skin appearance exhibited: Excoriation, Ecchymosis. 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 ([id number]) Plan Wound Cleansing: Wound #2 Left,Distal,Lateral Lower Leg: Clean wound with Normal Saline. Wound #6 Right Lower Leg: Clean wound with Normal Saline. Anesthetic: Wound #2 Left,Distal,Lateral Lower Leg: Topical Lidocaine 4% cream applied to wound bed prior to debridement Wound #6 Right Lower  Leg: Topical Lidocaine 4% cream applied to wound bed prior to debridement Primary Wound Dressing: Wound #6 Right Lower Leg: Prisma Ag Secondary Dressing: Wound #2 Left,Distal,Lateral Lower Leg: Boardered Foam Dressing Wound #6 Right Lower Leg:  Dry Gauze Boardered Foam Dressing Dressing Change Frequency: Wound #2 Left,Distal,Lateral Lower Leg: Change dressing every day. Wound #6 Right Lower Leg: Change dressing every day. Follow-up Appointments: Wound #2 Left,Distal,Lateral Lower Leg: Return Appointment in 1 week. - with Dr. Dellia Nims Wound #6 Right Lower Leg: Return Appointment in 1 week. - with Dr. Dellia Nims Edema Control: Wound #2 Left,Distal,Lateral Lower Leg: Patient to wear own compression stockings Wound #6 Right Lower Leg: Patient to wear own compression stockings We put Prisma over the right lower leg wound and border foam Just foam over the small vulnerable area on the left lateral leg Pounders, [name] ([id number]) Her own stockings bilaterally Electronic Signature(s) Signed: 02/13/2017 4:24:49 PM By: Linton Ham MD Entered By: Linton Ham on 02/13/2017 12:12:24 Stolz, [name] [place]. ([id number]) -------------------------------------------------------------------------------- [place] Details Patient Name: Kanaan, [name] Date of Service: 02/13/2017 Medical Record Patient Account Number: [id number] Number: Treating RN: Montey Hora 09/17/1951 (65 y.o. Other Clinician: Date of Birth/Sex: Female) Treating ROBSON, [place] Primary Care Provider: Webb Silversmith Provider/Extender: G Referring Provider: Webb Silversmith Service Line: Outpatient Weeks in Treatment: 5 Diagnosis Coding ICD-10 Codes Code Description Chronic venous hypertension (idiopathic) with ulcer and inflammation of left lower I87.332 extremity L97.221 Non-pressure chronic ulcer of left calf limited to breakdown of skin E11.42 Type 2 diabetes mellitus with diabetic polyneuropathy E11.622  Type 2 diabetes mellitus with other skin ulcer L97.212 Non-pressure chronic ulcer of right calf with fat layer exposed I89.0 Lymphedema, not elsewhere classified Facility Procedures CPT4 Code: 92010071 Description: 99214 - WOUND CARE VISIT-LEV 4 EST PT Modifier: Quantity: 1 Physician Procedures CPT4: Description Modifier Quantity Code 2197588 [id number] - WC PHYS LEVEL 3 - EST PT 1 ICD-10 Description Diagnosis I87.332 Chronic venous hypertension (idiopathic) with ulcer and inflammation of left lower extremity L97.212 Non-pressure chronic ulcer of  right calf with fat layer exposed L97.221 Non-pressure chronic ulcer of left calf limited to breakdown of skin Electronic Signature(s) Signed: 02/13/2017 12:33:10 PM By: Montey Hora Signed: 02/13/2017 4:24:49 PM By: Linton Ham MD Entered By: Montey Hora on 02/13/2017 12:33:09

## 2017-02-19 ENCOUNTER — Other Ambulatory Visit: Payer: Self-pay

## 2017-02-19 DIAGNOSIS — M1990 Unspecified osteoarthritis, unspecified site: Secondary | ICD-10-CM

## 2017-02-19 NOTE — Telephone Encounter (Signed)
Pt left v/m requesting rx oxycodone apap. Call when ready for pick up. Pt last seen and last printed rx # [id number] on 01/17/17.

## 2017-02-20 ENCOUNTER — Ambulatory Visit: Payer: BLUE CROSS/BLUE SHIELD | Admitting: Internal Medicine

## 2017-02-20 MED ORDER — OXYCODONE-ACETAMINOPHEN 10-325 MG PO TABS: 1.0000 | ORAL_TABLET | Freq: Four times a day (QID) | ORAL | 0 refills | Status: DC | PRN

## 2017-02-20 NOTE — Telephone Encounter (Signed)
Pt states they have not called her to set up appt for pain mgmt and will call or email [name] as soon as she gets an update   Rx left in front office for pick up and pt is aware

## 2017-02-20 NOTE — Telephone Encounter (Signed)
Call pt. When is her pain medicine appt?

## 2017-02-20 NOTE — Telephone Encounter (Signed)
RX printed and signed and placed in MYD box 

## 2017-02-26 ENCOUNTER — Encounter: Payer: BLUE CROSS/BLUE SHIELD | Admitting: Internal Medicine

## 2017-02-26 DIAGNOSIS — E11622 Type 2 diabetes mellitus with other skin ulcer: Secondary | ICD-10-CM | POA: Diagnosis not present

## 2017-02-28 NOTE — Progress Notes (Signed)
[name] [name] ([id number]) Visit Report for 02/26/2017 Chief Complaint Document Details Patient Name: [name] [name], [name] Date of Service: 02/26/2017 1:30 PM Medical Record Patient Account Number: [id number] Number: Treating RN: Montey Hora 1951/08/16 (65 y.o. Other Clinician: Date of Birth/Sex: Female) Treating [name] [name] Primary Care Provider: Webb Silversmith Provider/Extender: G Referring Provider: Colon Flattery in Treatment: 7 Information Obtained from: Patient Chief Complaint chronic venous hypertension with ulcer formation left lower extremity 01/08/17; patient presents today with ulcerations on the left lateral lower leg Electronic Signature(s) Signed: 02/27/2017 7:58:45 AM By: Linton Ham MD Entered By: Linton Ham on 02/26/2017 13:52:50 [name] [name], [name] ([id number]) -------------------------------------------------------------------------------- Debridement Details Patient Name: [name] [name], [name] Date of Service: 02/26/2017 1:30 PM Medical Record Patient Account Number: [id number] Number: Treating RN: Montey Hora 10/03/1951 (65 y.o. Other Clinician: Date of Birth/Sex: Female) Treating [name] [name], [place] Primary Care Provider: Webb Silversmith Provider/Extender: G Referring Provider: Colon Flattery in Treatment: 7 Debridement Performed for Wound #6 Right Lower Leg Assessment: Performed By: Physician [name] Dillon, MD Debridement: Debridement Pre-procedure Yes - 13:48 Verification/Time Out Taken: Start Time: 13:48 Pain Control: Lidocaine 4% Topical Solution Level: Skin/Subcutaneous Tissue Total Area Debrided (L x 1 (cm) x 1.1 (cm) = 1.1 (cm) W): Tissue and other Viable, Non-Viable, Fibrin/Slough, Subcutaneous material debrided: Instrument: Curette Bleeding: Minimum Hemostasis Achieved: Pressure End Time: 13:50 Procedural Pain: 0 Post Procedural Pain: 0 Response to Treatment: Procedure was tolerated well Post  Debridement Measurements of Total Wound Length: (cm) 1 Width: (cm) 1.1 Depth: (cm) 0.1 Volume: (cm) 0.086 Character of Wound/Ulcer Post Improved Debridement: Severity of Tissue Post Debridement: Fat layer exposed Post Procedure Diagnosis Same as Pre-procedure Electronic Signature(s) Signed: 02/27/2017 7:58:45 AM By: Linton Ham MD Signed: 02/27/2017 5:35:11 PM By: Montey Hora [name] [name], [name] ([id number]) Entered By: Linton Ham on 02/26/2017 13:52:44 [name] [name], [name] ([id number]) -------------------------------------------------------------------------------- HPI Details Patient Name: [name] [name], [name] Date of Service: 02/26/2017 1:30 PM Medical Record Patient Account Number: [id number] Number: Treating RN: Montey Hora 17-May-1951 (65 y.o. Other Clinician: Date of Birth/Sex: Female) Treating [name] [name], [name] [name] Primary Care Provider: Webb Silversmith Provider/Extender: G Referring Provider: Webb Silversmith Weeks in Treatment: 7 History of Present Illness Location: left lower extremity Severity: improved since her initial hospitalization but stable since Duration: 6 weeks Context: began after her third treatment of Remicade for rheumatoid arthritis Modifying Factors: morbid obesity, chronic venous hypertension, lymphedema, diabetes mellitus type 2, methotrexate use, Remicade use, Associated Signs and Symptoms: pain and swelling left lower extremity HPI Description: she was seen last week where a 2 layer light compression system was applied. She removed that on Sunday because of itching. She states that she did see her primary care physician who has prescribed her a stronger diuretic and she will begin that today. When she removed her dressing she applied some Neosporin and some gauze. There remains considerable concern about her ability to keep the wrap in place for a week she has been prescribed lymphedema pumps but she does not use those either. We will encourage  her to use those twice a day for an hour each time. These were prescribed to her by Dr. Hinton Lovely READMISSION 01/04/17; this is a patient who is a type II diabetic on oral agents. I note she was seen in this clinic 2-3 years ago. I was not involved in her care at that point. She tells [name] that she is had ulcers on her lateral left lower extremity since just before Christmas. There was no obvious precipitant to  this. She apparently has been on long-term suppressive penicillin prescribed by Dr. 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 She has noted increasing erythema along with all of this and I think this is what prompted her to come in today. She does not have a known history of PAD or neuropathy ABI in this clinic was 1.06 on the left. The patient has not been requiring any compression on her legs although she does have compression stockings at home as well as external compression pumps. She has been using neither of these. She has not been systemically unwell the patient is also known to the local vein and vascular group. Apparently her external compression pumps were previously prescribed by Dr. Delana Meyer. She has had apparent bilateral ablations done in the past although I have none of these records. 01/15/17; the patient returns for rewrap of her left leg last week and reports that was put on too tight, she had to remove it on Sunday. She states this actually occurred her even though the previous 3 lateral wrap she was able to tolerate well. I don't think there is been much in the way of expansion of the erythema on the lateral left leg lateral left heel and lateral left foot. As mentioned previously I think this is chronic venous insufficiency and not really an active cellulitis. She has 2 small open areas in the lower left calf 01/22/17; the patient complains of really generalized pain.  She has rheumatoid arthritis as well as fibromyalgia. She had to take the wrap off yesterday. She has small open areas on the lateral left heel and [name], [name] ([id number]) foot. 02/01/2017 -- the patient is seen by me today as she's had compression wraps for 10 days and I understand she did not realize she had to come back to see [name]. She did see Dr. Delana Meyer yesterday and his notes are not in, but the patient tells [name] that he is going to recommend a lymphedema pump and has not recommended any further venous workup or intervention. 02/06/17; the patient went to see Dr. Hinton Lovely of vascular surgery although I don't remember specifically being involved with this discussion. He felt she had chronic venous insufficiency without surgery or intervention necessary at this time. Also noted lymphedema and type 2 diabetes. In the meantime she did not tolerate the wrap on the right leg stating that the small wound anteriorly" burns like fire" 02/13/17; the lesions on the left lateral and left medial leg have closed over. Still looks a little vulnerable on the lateral aspect on the left Right; still an open area here but smaller and appears to be well granulated. Using Silver Collegen. She uses her own wrap on the right leg last week and I think week and continue that on both legs this week 02/26/17; the lesion on the left lateral leg and left medial leg are both closed over. Still to open areas on the right medial leg. She is been using her own stockings and the edema control looks adequate Electronic Signature(s) Signed: 02/27/2017 7:58:45 AM By: Linton Ham MD Entered By: Linton Ham on 02/26/2017 13:55:28 [name] [name], [name] ([id number]) -------------------------------------------------------------------------------- Physical Exam Details Patient Name: [name] [name], [name] Date of Service: 02/26/2017 1:30 PM Medical Record Patient Account Number: [id number] Number: Treating RN: Montey Hora 1951/07/13 (65 y.o. Other Clinician: Date of Birth/Sex: Female) Treating Keevan Wolz, [place] Primary Care Provider:  Webb Silversmith Provider/Extender: G Referring Provider: Webb Silversmith Weeks in Treatment: 7 Constitutional Patient is hypertensive.. Pulse regular and within target range for patient.[name] Respirations regular, non-labored and within target range.. Temperature is normal and within the target range for the patient.. Patient's appearance is neat and clean. Appears in no acute distress. Well nourished and well developed.. Notes Wound exam; she has a stable small granulated wound on the right medial leg just above the medial malleolus. There is a slightly larger wound above this. Both of these debrided of surface necrotic material with a #3 curet. She tolerates this marginally. There was no serious bleeding. oThe area on the left lateral leg from last time his closed over Electronic Signature(s) Signed: 02/27/2017 7:58:45 AM By: Linton Ham MD Entered By: Linton Ham on 02/26/2017 13:56:26 [name] [name], [name] ([id number]) -------------------------------------------------------------------------------- Physician Orders Details Patient Name: [name] [name], [name] Date of Service: 02/26/2017 1:30 PM Medical Record Patient Account Number: [id number] Number: Treating RN: Montey Hora 28-Oct-1951 (65 y.o. Other Clinician: Date of Birth/Sex: Female) Treating Humphrey Guerreiro Primary Care Provider: Webb Silversmith Provider/Extender: G Referring Provider: Colon Flattery in Treatment: 7 Verbal / Phone Orders: No Diagnosis Coding Wound Cleansing Wound #6 Right Lower Leg o Clean wound with Normal Saline. Anesthetic Wound #6 Right Lower Leg o Topical Lidocaine 4% cream applied to wound bed prior to debridement Primary Wound Dressing Wound #6 Right Lower Leg o Prisma Ag Secondary Dressing Wound #6 Right Lower Leg o Dry Gauze o Boardered Foam  Dressing Dressing Change Frequency Wound #6 Right Lower Leg o Change dressing every day. Follow-up Appointments Wound #6 Right Lower Leg o Return Appointment in 1 week. - with Dr. Dellia Nims Edema Control Wound #6 Right Lower Leg o Patient to wear own compression stockings Electronic Signature(s) Signed: 02/27/2017 7:58:45 AM By: Linton Ham MD Signed: 02/27/2017 5:35:11 PM By: Montey Hora Oneil, Lacoya D. ([id number]) Entered By: Montey Hora on 02/26/2017 13:51:33 [name] [name], Poetry D. ([id number]) -------------------------------------------------------------------------------- Problem List Details Patient Name: [name] [name], [name] Date of Service: 02/26/2017 1:30 PM Medical Record Patient Account Number: [id number] Number: Treating RN: Montey Hora 1951/03/27 (65 y.o. 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 ([id number]) -------------------------------------------------------------------------------- Progress Note Details Patient Name: [name] [name], [name] Date of Service: 02/26/2017 1:30  PM Medical Record Patient Account Number: [id number] Number: Treating RN: Montey Hora 1951-01-21 (65 y.o. Other Clinician: Date of Birth/Sex: Female) Treating Angas Isabell Primary Care Provider: Webb Silversmith Provider/Extender: G Referring Provider: Colon Flattery in Treatment: 7 Subjective Chief Complaint Information obtained from Patient chronic venous hypertension with ulcer formation left lower extremity 01/08/17; patient presents today with ulcerations on the left lateral lower leg History of Present Illness (HPI) The following HPI elements were documented for the patient's wound: Location: left lower extremity Severity: improved since her initial  hospitalization but stable since Duration: 6 weeks Context: began after her third treatment of Remicade for rheumatoid arthritis Modifying Factors: morbid obesity, chronic venous hypertension, lymphedema, diabetes mellitus type 2, methotrexate use, Remicade use, Associated Signs and Symptoms: pain and swelling left lower extremity she was seen last week where a 2 layer light compression system was applied. She removed that on Sunday because of itching. She states that she did see her primary care physician who has prescribed her a stronger diuretic and she will begin that today. When she removed her dressing she applied some Neosporin and some gauze. There remains considerable concern about her ability to keep the wrap in place for a week she has been prescribed lymphedema pumps but she does not use those either. We will encourage her to use those twice a day for an hour each time. These were prescribed to her by Dr. Hinton Lovely READMISSION 01/04/17; this is a patient who is a type II diabetic on oral agents. I note she was seen in this clinic 2-3 years ago. I was not involved in her care at that point. She tells [name] that she is had ulcers on her lateral left lower extremity since just before Christmas. There was no  obvious precipitant to this. She apparently has been on long-term suppressive penicillin prescribed by Dr. 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 She has noted increasing erythema along with all of this and I think this is what prompted her to come in today. She does not have a known history of PAD or neuropathy ABI in this clinic was 1.06 on the left. The patient has not been requiring any compression on her legs although she does have compression stockings at home as well as external compression pumps. She has been using neither of these. She has not been systemically unwell the patient is also known to the local vein and vascular group. Apparently her external compression pumps [name] [name], [name] ([id number]) were previously prescribed by Dr. Delana Meyer. She has had apparent bilateral ablations done in the past although I have none of these records. 01/15/17; the patient returns for rewrap of her left leg last week and reports that was put on too tight, she had to remove it on Sunday. She states this actually occurred her even though the previous 3 lateral wrap she was able to tolerate well. I don't think there is been much in the way of expansion of the erythema on the lateral left leg lateral left heel and lateral left foot. As mentioned previously I think this is chronic venous insufficiency and not really an active cellulitis. She has 2 small open areas in the lower left calf 01/22/17; the patient complains of really generalized pain. She has rheumatoid arthritis as well as fibromyalgia. She had to take the wrap off yesterday. She has small open areas on the lateral left heel and foot. 02/01/2017 -- the patient is seen by me today as she's had compression wraps for 10 days and I understand she did not realize she had to come back to see [name]. She did see Dr. Delana Meyer yesterday and his notes  are not in, but the patient tells [name] that he is going to recommend a lymphedema pump and has not recommended any further venous workup or intervention. 02/06/17; the patient went to see Dr. Hinton Lovely of vascular surgery although I don't remember specifically being involved with  this discussion. He felt she had chronic venous insufficiency without surgery or intervention necessary at this time. Also noted lymphedema and type 2 diabetes. In the meantime she did not tolerate the wrap on the right leg stating that the small wound anteriorly" burns like fire" 02/13/17; the lesions on the left lateral and left medial leg have closed over. Still looks a little vulnerable on the lateral aspect on the left Right; still an open area here but smaller and appears to be well granulated. Using Silver Collegen. She uses her own wrap on the right leg last week and I think week and continue that on both legs this week 02/26/17; the lesion on the left lateral leg and left medial leg are both closed over. Still to open areas on the right medial leg. She is been using her own stockings and the edema control looks adequate Objective Constitutional Patient is hypertensive.. Pulse regular and within target range for patient.[name] Respirations regular, non-labored and within target range.. Temperature is normal and within the target range for the patient.. Patient's appearance is neat and clean. Appears in no acute distress. Well nourished and well developed.. Vitals Time Taken: 1:31 PM, Height: 67 in, Weight: 338 lbs, BMI: 52.9, Temperature: 98.1 F, Pulse: 78 bpm, Respiratory Rate: 18 breaths/min, Blood Pressure: 160/73 mmHg. General Notes: Wound exam; she has a stable small granulated wound on the right medial leg just above the medial malleolus. There is a slightly larger wound above this. Both of these debrided of surface necrotic material with a #3 curet. She tolerates this marginally. There was no serious bleeding. The  area on the left lateral leg from last time his closed over Integumentary (Hair, Skin) Wound #2 status is Open. Original cause of wound was Blister. The wound is located on the Barbe, Duha D. ([id number]) Left,Distal,Lateral Lower Leg. The wound measures 0cm length x 0cm width x 0cm depth; 0cm^2 area and 0cm^3 volume. Wound #6 status is Open. Original cause of wound was Trauma. The wound is located on the Right Lower Leg. The wound measures 1cm length x 1.1cm width x 0.1cm depth; 0.864cm^2 area and 0.086cm^3 volume. There is Fat Layer (Subcutaneous Tissue) Exposed exposed. There is no tunneling or undermining noted. There is a medium amount of serosanguineous drainage noted. The wound margin is indistinct and nonvisible. There is large (67-100%) red granulation within the wound bed. There is a small (1-33%) amount of necrotic tissue within the wound bed including Eschar. The periwound skin appearance exhibited: Excoriation, Ecchymosis. 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 There was a Skin/Subcutaneous Tissue Debridement ([id number]) debridement with total area of 1.1 sq cm performed by [name] Dillon, MD. with the following instrument(s): Curette to remove Viable and Non-Viable tissue/material including Fibrin/Slough and Subcutaneous after achieving pain control using Lidocaine 4% Topical Solution. A time out was conducted at 13:48, prior to the start of the  procedure. A Minimum amount of bleeding was controlled with Pressure. The procedure was tolerated well with a pain level of 0 throughout and  a pain level of 0 following the procedure. Post Debridement Measurements: 1cm length x 1.1cm width x 0.1cm depth; 0.086cm^3 volume. Character of Wound/Ulcer Post Debridement is improved. Severity of Tissue Post Debridement is: Fat layer exposed. Post procedure Diagnosis Wound #6: Same as Pre-Procedure Crumble, Kennice D. ([id number]) Plan Wound Cleansing: Wound #6 Right Lower Leg: Clean wound with Normal Saline. Anesthetic: Wound #6 Right Lower Leg: Topical Lidocaine 4% cream applied to wound bed prior to debridement Primary Wound Dressing: Wound #6 Right Lower Leg: Prisma Ag Secondary Dressing: Wound #6 Right Lower Leg: Dry Gauze Boardered Foam Dressing Dressing Change Frequency: Wound #6 Right Lower Leg: Change dressing every day. Follow-up Appointments: Wound #6 Right Lower Leg: Return Appointment in 1 week. - with Dr. Dellia Nims Edema Control: Wound #6 Right Lower Leg: Patient to wear own compression stockings #1 I'm going to continue with Prisma to both of the wounds on the right medial lower leg. Hopefully with debridement today they will closed. We have been using border foam in her own stocking #2 no reason to specifically address the area on the left lateral leg at this point I've given her permission to go to her own stocking Electronic Signature(s) Signed: 02/27/2017 7:58:45 AM By: Linton Ham MD Entered By: Linton Ham on 02/26/2017 13:57:13 Debruler, Arianny D. ([id number]) -------------------------------------------------------------------------------- SuperBill Details Patient Name: Bieler, Dottie D. Date of Service: 02/26/2017 Medical Record Patient Account Number: [id number] Number: Treating RN: Montey Hora [birth year] (65 y.o. Other Clinician: Date of Birth/Sex: Female) Treating Mann Skaggs, [place] Primary Care  Provider: Webb Silversmith Provider/Extender: G Referring Provider: Colon Flattery in Treatment: 7 Diagnosis Coding ICD-10 Codes Code Description Chronic venous hypertension (idiopathic) with ulcer and inflammation of left lower I87.332 extremity L97.221 Non-pressure chronic ulcer of left calf limited to breakdown of skin E11.42 Type 2 diabetes mellitus with diabetic polyneuropathy E11.622 Type 2 diabetes mellitus with other skin ulcer L97.212 Non-pressure chronic ulcer of right calf with fat layer exposed I89.0 Lymphedema, not elsewhere classified Facility Procedures CPT4 Code: [id number] Description: 47096 - DEB SUBQ TISSUE 20 SQ CM/< ICD-10 Description Diagnosis L97.212 Non-pressure chronic ulcer of right calf with fat Modifier: layer exposed Quantity: 1 Physician Procedures CPT4 Code: [id number] Description: 11042 - WC PHYS SUBQ TISS 20 SQ CM ICD-10 Description Diagnosis L97.212 Non-pressure chronic ulcer of right calf with fat Modifier: layer exposed Quantity: 1 Electronic Signature(s) Signed: 02/27/2017 7:58:45 AM By: Linton Ham MD Entered By: Linton Ham on 02/26/2017 13:57:31
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
## 2017-03-05 ENCOUNTER — Ambulatory Visit: Payer: BLUE CROSS/BLUE SHIELD | Admitting: Nurse Practitioner

## 2017-03-07 ENCOUNTER — Encounter: Payer: Self-pay | Admitting: Internal Medicine

## 2017-03-12 ENCOUNTER — Encounter: Payer: BLUE CROSS/BLUE SHIELD | Attending: Internal Medicine | Admitting: Internal Medicine

## 2017-03-12 DIAGNOSIS — L97212 Non-pressure chronic ulcer of right calf with fat layer exposed: Secondary | ICD-10-CM | POA: Insufficient documentation

## 2017-03-12 DIAGNOSIS — F419 Anxiety disorder, unspecified: Secondary | ICD-10-CM | POA: Insufficient documentation

## 2017-03-12 DIAGNOSIS — I110 Hypertensive heart disease with heart failure: Secondary | ICD-10-CM | POA: Diagnosis not present

## 2017-03-12 DIAGNOSIS — Z7984 Long term (current) use of oral hypoglycemic drugs: Secondary | ICD-10-CM | POA: Diagnosis not present

## 2017-03-12 DIAGNOSIS — Z8711 Personal history of peptic ulcer disease: Secondary | ICD-10-CM | POA: Insufficient documentation

## 2017-03-12 DIAGNOSIS — L97221 Non-pressure chronic ulcer of left calf limited to breakdown of skin: Secondary | ICD-10-CM | POA: Diagnosis not present

## 2017-03-12 DIAGNOSIS — I509 Heart failure, unspecified: Secondary | ICD-10-CM | POA: Diagnosis not present

## 2017-03-12 DIAGNOSIS — I890 Lymphedema, not elsewhere classified: Secondary | ICD-10-CM | POA: Insufficient documentation

## 2017-03-12 DIAGNOSIS — E11622 Type 2 diabetes mellitus with other skin ulcer: Secondary | ICD-10-CM | POA: Insufficient documentation

## 2017-03-12 DIAGNOSIS — Z96652 Presence of left artificial knee joint: Secondary | ICD-10-CM | POA: Diagnosis not present

## 2017-03-12 DIAGNOSIS — I87332 Chronic venous hypertension (idiopathic) with ulcer and inflammation of left lower extremity: Secondary | ICD-10-CM | POA: Diagnosis not present

## 2017-03-12 DIAGNOSIS — M069 Rheumatoid arthritis, unspecified: Secondary | ICD-10-CM | POA: Diagnosis not present

## 2017-03-12 DIAGNOSIS — Z6841 Body Mass Index (BMI) 40.0 and over, adult: Secondary | ICD-10-CM | POA: Insufficient documentation

## 2017-03-12 DIAGNOSIS — E1142 Type 2 diabetes mellitus with diabetic polyneuropathy: Secondary | ICD-10-CM | POA: Insufficient documentation

## 2017-03-14 NOTE — Progress Notes (Addendum)
[name], [name] BIANCARDI ([id number]) Visit Report for 03/12/2017 Arrival Information Details Patient Name: [name] [name], [name] Date of Service: 03/12/2017 11:00 AM Medical Record Patient Account Number: [id number] Number: Treating RN: Montey Hora [birth year] (65 y.o. Other Clinician: Date of Birth/Sex: Female) Treating ROBSON, MICHAEL Primary Care Laurian Edrington: Webb Silversmith Derrich Gaby/Extender: G Referring Rowe Warman: Colon Flattery in Treatment: 9 Visit Information History Since Last Visit Added or deleted any medications: No Patient Arrived: Cane Any new allergies or adverse reactions: No Arrival Time: 11:04 Had a fall or experienced change in No Accompanied By: self activities of daily living that may affect Transfer Assistance: None risk of falls: Patient Identification Verified: Yes Signs or symptoms of abuse/neglect since last No Secondary Verification Process Yes visito Completed: Hospitalized since last visit: No Patient Requires Transmission-Based No Has Dressing in Place as Prescribed: Yes Precautions: Has Compression in Place as Prescribed: Yes Patient Has Alerts: Yes Pain Present Now: No Patient Alerts: Type II Diabetic Electronic Signature(s) Signed: 03/12/2017 4:57:29 PM By: Montey Hora Entered By: Montey Hora on 03/12/2017 11:04:47 Tupy, Rhett D. ([id number]) -------------------------------------------------------------------------------- Clinic Level of Care Assessment Details Patient Name: [name] [name], [name] Date of Service: 03/12/2017 11:00 AM Medical Record Patient Account Number: [id number] Number: Treating RN: Montey Hora [birth year] (65 y.o. Other Clinician: Date of Birth/Sex: Female) Treating ROBSON, [place] Primary Care Antia Rahal: Webb Silversmith Eloni Darius/Extender: G Referring Mitsuye Schrodt: Colon Flattery in Treatment: 9 Clinic Level of Care Assessment Items TOOL 4 Quantity Score []  - Use when only an EandM is performed on  FOLLOW-UP visit 0 ASSESSMENTS - Nursing Assessment / Reassessment X - Reassessment of Co-morbidities (includes updates in patient status) 1 10 X - Reassessment of Adherence to Treatment Plan 1 5 ASSESSMENTS - Wound and Skin Assessment / Reassessment []  - Simple Wound Assessment / Reassessment - one wound 0 X - Complex Wound Assessment / Reassessment - multiple wounds 2 5 []  - Dermatologic / Skin Assessment (not related to wound area) 0 ASSESSMENTS - Focused Assessment []  - Circumferential Edema Measurements - multi extremities 0 []  - Nutritional Assessment / Counseling / Intervention 0 X - Lower Extremity Assessment (monofilament, tuning fork, pulses) 1 5 []  - Peripheral Arterial Disease Assessment (using hand held doppler) 0 ASSESSMENTS - Ostomy and/or Continence Assessment and Care []  - Incontinence Assessment and Management 0 []  - Ostomy Care Assessment and Management (repouching, etc.) 0 PROCESS - Coordination of Care X - Simple Patient / Family Education for ongoing care 1 15 []  - Complex (extensive) Patient / Family Education for ongoing care 0 []  - Staff obtains Programmer, systems, Records, Test Results / Process Orders 0 []  - Staff telephones HHA, Nursing Homes / Clarify orders / etc 0 Clare, Sueanne D. ([id number]) []  - Routine Transfer to another Facility (non-emergent condition) 0 []  - Routine Hospital Admission (non-emergent condition) 0 []  - New Admissions / Biomedical engineer / Ordering NPWT, Apligraf, etc. 0 []  - Emergency Hospital Admission (emergent condition) 0 X - Simple Discharge Coordination 1 10 []  - Complex (extensive) Discharge Coordination 0 PROCESS - Special Needs []  - Pediatric / Minor Patient Management 0 []  - Isolation Patient Management 0 []  - Hearing / Language / Visual special needs 0 []  - Assessment of Community assistance (transportation, D/C planning, etc.) 0 []  - Additional assistance / Altered mentation 0 []  - Support Surface(s) Assessment (bed,  cushion, seat, etc.) 0 INTERVENTIONS - Wound Cleansing / Measurement []  - Simple Wound Cleansing - one wound 0 X - Complex Wound Cleansing - multiple wounds 2 5  X - Wound Imaging (photographs - any number of wounds) 1 5 []  - Wound Tracing (instead of photographs) 0 []  - Simple Wound Measurement - one wound 0 X - Complex Wound Measurement - multiple wounds 2 5 INTERVENTIONS - Wound Dressings X - Small Wound Dressing one or multiple wounds 2 10 []  - Medium Wound Dressing one or multiple wounds 0 []  - Large Wound Dressing one or multiple wounds 0 []  - Application of Medications - topical 0 []  - Application of Medications - injection 0 [name] [name], [name] ([id number]) INTERVENTIONS - Miscellaneous []  - External ear exam 0 []  - Specimen Collection (cultures, biopsies, blood, body fluids, etc.) 0 []  - Specimen(s) / Culture(s) sent or taken to Lab for analysis 0 []  - Patient Transfer (multiple staff / Harrel Lemon Lift / Similar devices) 0 []  - Simple Staple / Suture removal (25 or less) 0 []  - Complex Staple / Suture removal (26 or more) 0 []  - Hypo / Hyperglycemic Management (close monitor of Blood Glucose) 0 []  - Ankle / Brachial Index (ABI) - do not check if billed separately 0 X - Vital Signs 1 5 Has the patient been seen at the hospital within the last three years: Yes Total Score: 105 Level Of Care: New/Established - Level 3 Electronic Signature(s) Signed: 03/12/2017 4:57:29 PM By: Montey Hora Entered By: Montey Hora on 03/12/2017 14:20:47 Dieterich, Loise D. ([id number]) -------------------------------------------------------------------------------- Encounter Discharge Information Details Patient Name: [name] [name], [name] Date of Service: 03/12/2017 11:00 AM Medical Record Patient Account Number: [id number] Number: Treating RN: Montey Hora Apr 16, 1951 (65 y.o. Other Clinician: Date of Birth/Sex: Female) Treating ROBSON, [place] Primary Care Bethany Cumming: Webb Silversmith Afreen Siebels/Extender: G Referring Trevino Wyatt: Colon Flattery in Treatment: 9 Encounter Discharge Information Items Discharge Pain Level: 0 Discharge Condition: Stable Ambulatory Status: Cane Discharge Destination: Home Transportation: Private Auto Accompanied By: self Schedule Follow-up Appointment: Yes Medication Reconciliation completed No and provided to Patient/Care Ricky Doan: Provided on Clinical Summary of Care: 03/12/2017 Form Type Recipient Paper Patient EG Electronic Signature(s) Signed: 03/12/2017 11:37:27 AM By: Ruthine Dose Entered By: Ruthine Dose on 03/12/2017 11:37:26 Wedeking, Bernedette D. ([id number]) -------------------------------------------------------------------------------- Lower Extremity Assessment Details Patient Name: [name] [name], [name] Date of Service: 03/12/2017 11:00 AM Medical Record Patient Account Number: [id number] Number: Treating RN: Montey Hora 07-Jan-1951 (65 y.o. Other Clinician: Date of Birth/Sex: Female) Treating ROBSON, MICHAEL Primary Care Jermine Bibbee: Webb Silversmith Fayelynn Distel/Extender: G Referring Chirstine Defrain: Colon Flattery in Treatment: 9 Vascular Assessment Pulses: Dorsalis Pedis Palpable: [Right:Yes] Posterior Tibial Extremity colors, hair growth, and conditions: Extremity Color: [Right:Hyperpigmented] Hair Growth on Extremity: [Right:No] Temperature of Extremity: [Right:Warm] Capillary Refill: [Right:< 3 seconds] Electronic Signature(s) Signed: 03/12/2017 4:57:29 PM By: Montey Hora Entered By: Montey Hora on 03/12/2017 11:16:50 Prestigiacomo, Oretha D. ([id number]) -------------------------------------------------------------------------------- Multi Wound Chart Details Patient Name: Myren, Dyane D. Date of Service: 03/12/2017 11:00 AM Medical Record Patient Account Number: [id number] Number: Treating RN: Montey Hora Dec 18, 1950 (65 y.o. 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] [N/A:N/A] Comorbid History: [6:Lymphedema, Congestive Lymphedema, Congestive N/A Heart Failure, Coronary Heart Failure, Coronary Artery Disease, Hypertension, Type II  Diabetes, Rheumatoid Arthritis, Osteoarthritis, Arthritis, Osteoarthritis, Confinement Anxiety]  [7:Artery Disease, Hypertension, Type II Diabetes, Rheumatoid Confinement Anxiety] Date Acquired: [6:01/03/2017] [7:03/04/2017] [N/A:N/A] Weeks of Treatment: [6:5] [7:0] [N/A:N/A] Wound Status: [6:Open] [7:Open] [N/A:N/A] Measurements L x W x D 1.1x1x0.1 [7:1.2x0.8x0.1] [N/A:N/A] (cm) Area (cm) : [6:0.864] [7:0.754] [N/A:N/A] Volume (cm) : [6:0.086] [7:0.075] [N/A:N/A] % Reduction in Area: [6:21.50%] [7:N/A] [N/A:N/A] % Reduction in Volume: 21.80% [7:N/A] [N/A:N/A] Classification: [6:Grade 2] [7:Partial Thickness] [N/A:N/A] HBO Classification: [6:N/A] [7:Grade 1] [N/A:N/A] Exudate Amount: [6:Medium] [7:Large] [N/A:N/A] Exudate Type: [6:Serosanguineous] [7:Serous] [N/A:N/A] Exudate Color: [6:red, brown] [7:amber] [N/A:N/A] Wound Margin: [6:Indistinct, nonvisible] [7:Flat and Intact] [N/A:N/A] Granulation Amount: [6:Large (67-100%)] [7:Large (67-100%)] [N/A:N/A] Granulation Quality: Red Pink N/A Necrotic Amount: Small (1-33%) Small (1-33%) N/A Necrotic Tissue: Eschar Adherent Slough N/A Exposed Structures: Fat Layer (Subcutaneous Fascia: No N/A Tissue) Exposed: Yes Fat Layer  (Subcutaneous Fascia: No Tissue) Exposed: No Tendon: No Tendon: No Muscle: No Muscle: No Joint: No Joint: No Bone: No Bone: No Limited to Skin Breakdown Epithelialization: Small (1-33%) Medium (34-66%) N/A Periwound Skin Texture: Excoriation: Yes Excoriation: No N/A Induration: No Callus: No Crepitus: No Rash: No Scarring: No Periwound Skin No Abnormalities Noted Maceration: No N/A Moisture: Dry/Scaly: No Periwound Skin Color: Ecchymosis: Yes Atrophie Blanche: No N/A Cyanosis: No Ecchymosis: No Erythema: No Hemosiderin Staining: No Mottled: No Pallor: No Rubor: No Tenderness on Yes Yes N/A Palpation: Wound Preparation: Ulcer Cleansing: Ulcer Cleansing: N/A Rinsed/Irrigated with Rinsed/Irrigated with Saline Saline Topical Anesthetic Topical Anesthetic Applied: Other: lidocaine Applied: Other: lidocaine 4% 4% Treatment Notes Electronic Signature(s) Signed: 03/12/2017 5:28:32 PM By: Linton Ham MD Entered By: Linton Ham on 03/12/2017 11:40:56 [name] [name], [name] ([id number]) -------------------------------------------------------------------------------- Multi-Disciplinary Care Plan Details Patient Name: [name] [name], [name] Date of Service: 03/12/2017 11:00 AM Medical Record Patient Account Number: [id number] Number: Treating RN: Montey Hora 1951-08-19 (65 y.o. Other Clinician: Date of Birth/Sex: Female) Treating ROBSON, MICHAEL Primary Care Mayci Haning: Webb Silversmith Emara Lichter/Extender: G Referring Danyeal Akens: Colon Flattery in Treatment: 9 Active Inactive Electronic Signature(s) Signed: 04/03/2017 8:59:54 AM By: Gretta Cool RN, BSN, Kim RN, BSN Signed: 04/17/2017 8:02:20 AM By: Montey Hora Previous Signature: 03/12/2017 4:57:29 PM Version By: Montey Hora Entered By: Gretta Cool RN, BSN, Kim on 04/03/2017 08:59:53 [name] [name], [name] ([id number]) -------------------------------------------------------------------------------- Pain Assessment  Details Patient Name: [name] [name], [name] Date of Service: 03/12/2017 11:00 AM Medical Record Patient Account Number: [id number] Number: Treating RN: Montey Hora [birth year] (65 y.o. Other Clinician: Date of Birth/Sex: Female) Treating ROBSON, [place] Primary Care Clara Herbison: Webb Silversmith Kaetlyn Noa/Extender: G Referring Abdulkarim Eberlin: Colon Flattery in Treatment: 9 Active Problems Location of Pain Severity and Description of Pain Patient Has Paino No Site Locations Pain Management and Medication Current Pain Management: Notes Topical or injectable lidocaine is offered to patient for acute pain when surgical debridement is performed. If needed, Patient is instructed to use over the counter pain medication for the following 24-48 hours after debridement. Wound care MDs do not prescribed pain medications. Patient has chronic pain or uncontrolled pain. Patient has been instructed to make an appointment with their Primary Care Physician for pain management. Electronic Signature(s) Signed: 03/12/2017 4:57:29 PM By: Montey Hora Entered By: Montey Hora on 03/12/2017 11:04:55 [name] [name], [name] ([id number]) -------------------------------------------------------------------------------- Patient/Caregiver Education Details Patient Name: [name] [name], [name] Date of Service: 03/12/2017 11:00 AM Medical Record Patient Account Number: [id number] Number: Treating RN: Montey Hora 05-Oct-1951 (65 y.o. Other Clinician: Date of Birth/Gender: Female) Treating ROBSON, MICHAEL Primary Care Physician/Extender: [place] Physician: Weeks in Treatment: 9 Referring Physician:  Webb Silversmith Education Assessment Education Provided To: Patient Education Topics Provided Venous: Handouts: Other: leg elevation and compression daily Methods: Explain/Verbal Responses: State content correctly Electronic Signature(s) Signed: 03/12/2017 4:57:29 PM By: Montey Hora Entered By:  Montey Hora on 03/12/2017 11:25:55 [name] [name], Sia D. ([id number]) -------------------------------------------------------------------------------- Wound Assessment Details Patient Name: Daywalt, Daris D. Date of Service: 03/12/2017 11:00 AM Medical Record Patient Account Number: [id number] Number: Treating RN: Montey Hora 06/18/1951 (65 y.o. Other Clinician: Date of Birth/Sex: Female) Treating ROBSON, [place] Primary Care Trilby Way: Webb Silversmith Zilda No/Extender: G Referring Margarit Minshall: Webb Silversmith Weeks in Treatment: 9 Wound Status Wound Number: 6 Primary Diabetic Wound/Ulcer of the Lower Etiology: Extremity Wound Location: Right Lower Leg Wound Open Wounding Event: Trauma Status: Date Acquired: 01/03/2017 Comorbid Lymphedema, Congestive Heart Failure, Weeks Of Treatment: 5 History: Coronary Artery Disease, Hypertension, Clustered Wound: No Type II Diabetes, Rheumatoid Arthritis, Osteoarthritis, Confinement Anxiety Photos Photo Uploaded By: Montey Hora on 03/12/2017 16:28:49 Wound Measurements Length: (cm) 1.1 Width: (cm) 1 Depth: (cm) 0.1 Area: (cm) 0.864 Volume: (cm) 0.086 % Reduction in Area: 21.5% % Reduction in Volume: 21.8% Epithelialization: Small (1-33%) Tunneling: No Undermining: No Wound Description Classification: Grade 2 Foul Odor After Wound Margin: Indistinct, nonvisible Slough/Fibrino Exudate Amount: Medium Exudate Type: Serosanguineous Exudate Color: red, brown Cleansing: No Yes Wound Bed Granulation Amount: Large (67-100%) Exposed Structure Slager, Aracelys D. ([id number]) Granulation Quality: Red Fascia Exposed: No Necrotic Amount: Small (1-33%) Fat Layer (Subcutaneous Tissue) Exposed: Yes Necrotic Quality: Eschar Tendon Exposed: No Muscle Exposed: No Joint Exposed: No Bone Exposed: No Periwound Skin Texture Texture Color No Abnormalities Noted: No No Abnormalities Noted: No Excoriation: Yes Ecchymosis: Yes Moisture  Temperature / Pain No Abnormalities Noted: No Tenderness on Palpation: Yes Wound Preparation Ulcer Cleansing: Rinsed/Irrigated with Saline Topical Anesthetic Applied: Other: lidocaine 4%, Electronic Signature(s) Signed: 03/12/2017 4:57:29 PM By: Montey Hora Entered By: Montey Hora on 03/12/2017 11:16:32 Wellnitz, Monette D. ([id number]) -------------------------------------------------------------------------------- Wound Assessment Details Patient Name: Pridgeon, Bonnetta D. Date of Service: 03/12/2017 11:00 AM Medical Record Patient Account Number: [id number] Number: Treating RN: Montey Hora 09-22-1951 (65 y.o. Other Clinician: Date of Birth/Sex: Female) Treating ROBSON, [place] Primary Care Cerina Leary: Webb Silversmith Harwood Nall/Extender: G Referring Ski Polich: Webb Silversmith Weeks in Treatment: 9 Wound Status Wound Number: 7 Primary Venous Leg Ulcer Etiology: Wound Location: Right Malleolus - Medial Wound Open Wounding Event: Trauma Status: Date Acquired: 03/04/2017 Comorbid Lymphedema, Congestive Heart Failure, Weeks Of Treatment: 0 History: Coronary Artery Disease, Hypertension, Clustered Wound: No Type II Diabetes, Rheumatoid Arthritis, Osteoarthritis, Confinement Anxiety Photos Photo Uploaded By: Montey Hora on 03/12/2017 16:28:50 Wound Measurements Length: (cm) 1.2 Width: (cm) 0.8 Depth: (cm) 0.1 Area: (cm) 0.754 Volume: (cm) 0.075 % Reduction in Area: % Reduction in Volume: Epithelialization: Medium (34-66%) Tunneling: No Undermining: No Wound Description Classification: Partial Thickness Foul Odor Afte Diabetic Severity (Wagner): Grade 1 Slough/Fibrino Wound Margin: Flat and Intact Exudate Amount: Large Exudate Type: Serous Exudate Color: amber r Cleansing: No Yes Wound Bed Letarte, Tyreona D. ([id number]) Granulation Amount: Large (67-100%) Exposed Structure Granulation Quality: Pink Fascia Exposed: No Necrotic Amount: Small (1-33%) Fat  Layer (Subcutaneous Tissue) Exposed: No Necrotic Quality: Adherent Slough Tendon Exposed: No Muscle Exposed: No Joint Exposed: No Bone Exposed: No Limited to Skin Breakdown Periwound Skin Texture Texture Color No Abnormalities Noted: No No Abnormalities Noted: No Callus: No Atrophie Blanche: No Crepitus: No Cyanosis: No Excoriation: No Ecchymosis: No Induration: No Erythema: No Rash: No Hemosiderin Staining: No Scarring: No Mottled: No Pallor: No Moisture Rubor: No No Abnormalities Noted:  No Dry / Scaly: No Temperature / Pain Maceration: No Tenderness on Palpation: Yes Wound Preparation Ulcer Cleansing: Rinsed/Irrigated with Saline Topical Anesthetic Applied: Other: lidocaine 4%, Electronic Signature(s) Signed: 03/12/2017 4:57:29 PM By: Montey Hora Entered By: Montey Hora on 03/12/2017 11:15:55 Guimaraes, Kleigh D. ([id number]) -------------------------------------------------------------------------------- Vitals Details Patient Name: Borkowski, Levern D. Date of Service: 03/12/2017 11:00 AM Medical Record Patient Account Number: [id number] Number: Treating RN: Montey Hora 1951/04/25 (65 y.o. Other Clinician: Date of Birth/Sex: Female) Treating ROBSON, MICHAEL Primary Care Mickel Schreur: Webb Silversmith Alven Alverio/Extender: G Referring Meribeth Vitug: Webb Silversmith Weeks in Treatment: 9 Vital Signs Time Taken: 11:05 Temperature (F): 98.3 Height (in): 67 Pulse (bpm): 74 Weight (lbs): 338 Respiratory Rate (breaths/min): 18 Body Mass Index (BMI): 52.9 Blood Pressure (mmHg): 156/77 Reference Range: 80 - 120 mg / dl Electronic Signature(s) Signed: 03/12/2017 4:57:29 PM By: Montey Hora Entered By: Montey Hora on 03/12/2017 11:07:29

## 2017-03-14 NOTE — Progress Notes (Signed)
[name], [name] ([id number]) Visit Report for 03/12/2017 Chief Complaint Document Details Patient Name: [name], [name] Date of Service: 03/12/2017 11:00 AM Medical Record Patient Account Number: [id number] Number: Treating RN: Montey Hora [birth year] (65 y.o. Other Clinician: Date of Birth/Sex: Female) Treating Ja Pistole Primary Care Provider: Webb Silversmith Provider/Extender: G Referring Provider: Colon Flattery in Treatment: 9 Information Obtained from: Patient Chief Complaint chronic venous hypertension with ulcer formation left lower extremity 01/08/17; patient presents today with ulcerations on the left lateral lower leg Electronic Signature(s) Signed: 03/12/2017 5:28:32 PM By: Linton Ham MD Entered By: Linton Ham on 03/12/2017 11:43:01 [name], [name] ([id number]) -------------------------------------------------------------------------------- HPI Details Patient Name: [name], [name] Date of Service: 03/12/2017 11:00 AM Medical Record Patient Account Number: [id number] Number: Treating RN: Montey Hora [birth year] (65 y.o. Other Clinician: Date of Birth/Sex: Female) Treating Aislynn Cifelli, [place] Primary Care Provider: Webb Silversmith Provider/Extender: G Referring Provider: Webb Silversmith Weeks in Treatment: 9 History of Present Illness Location: left lower extremity Severity: improved since her initial hospitalization but stable since Duration: 6 weeks Context: began after her third treatment of Remicade for rheumatoid arthritis Modifying Factors: morbid obesity, chronic venous hypertension, lymphedema, diabetes mellitus type 2, methotrexate use, Remicade use, Associated Signs and Symptoms: pain and swelling left lower extremity HPI Description: she was seen last week where a 2 layer light compression system was applied. She removed that on Sunday because of itching. She states that she did see her primary care physician who has  prescribed her a stronger diuretic and she will begin that today. When she removed her dressing she applied some Neosporin and some gauze. There remains considerable concern about her ability to keep the wrap in place for a week she has been prescribed lymphedema pumps but she does not use those either. We will encourage her to use those twice a day for an hour each time. These were prescribed to her by Dr. Hinton Lovely READMISSION 01/04/17; this is a patient who is a type II diabetic on oral agents. I note she was seen in this clinic 2-3 years ago. I was not involved in her care at that point. She tells [name] that she is had ulcers on her lateral left lower extremity since just before Christmas. There was no obvious precipitant to this. She apparently has been on long-term suppressive penicillin prescribed by Dr. 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 She has noted increasing erythema along with all of this and I think this is what prompted her to come in today. She does not have a known history of PAD or neuropathy ABI in this clinic was 1.06 on the left. The patient has not been requiring any compression on her legs although she does have compression stockings at home as well as external compression pumps. She has been using neither of these. She has not been systemically unwell the patient is also known to the local vein and vascular group. Apparently her external compression pumps were previously prescribed by Dr. Delana Meyer. She has had apparent bilateral ablations done in the past although I have none of these records. 01/15/17; the patient returns for rewrap of her left leg last week and reports that was put on too tight, she had to remove it on Sunday. She states this actually occurred her even though the previous 3 lateral wrap she was able to tolerate well. I don't think there  is been  much in the way of expansion of the erythema on the lateral left leg lateral left heel and lateral left foot. As mentioned previously I think this is chronic venous insufficiency and not really an active cellulitis. She has 2 small open areas in the lower left calf 01/22/17; the patient complains of really generalized pain. She has rheumatoid arthritis as well as fibromyalgia. She had to take the wrap off yesterday. She has small open areas on the lateral left heel and [name], [name] ([id number]) foot. 02/01/2017 -- the patient is seen by me today as she's had compression wraps for 10 days and I understand she did not realize she had to come back to see [name]. She did see Dr. Delana Meyer yesterday and his notes are not in, but the patient tells [name] that he is going to recommend a lymphedema pump and has not recommended any further venous workup or intervention. 02/06/17; the patient went to see Dr. Hinton Lovely of vascular surgery although I don't remember specifically being involved with this discussion. He felt she had chronic venous insufficiency without surgery or intervention necessary at this time. Also noted lymphedema and type 2 diabetes. In the meantime she did not tolerate the wrap on the right leg stating that the small wound anteriorly" burns like fire" 02/13/17; the lesions on the left lateral and left medial leg have closed over. Still looks a little vulnerable on the lateral aspect on the left Right; still an open area here but smaller and appears to be well granulated. Using Silver Collegen. She uses her own wrap on the right leg last week and I think week and continue that on both legs this week 02/26/17; the lesion on the left lateral leg and left medial leg are both closed over. Still to open areas on the right medial leg. She is been using her own stockings and the edema control looks adequate 03/12/17 she has no open areas on the left lateral or left medial leg. She still has an open area on  the right medial leg and last week we do find an area over the right medial malleolus. She is been to see vascular surgery who did not recommend any further venous workup. She has lymphedema pumps which she uses although edema doesn't appear to be a big part of her current nonhealing state. She has been using [employer]) Signed: 03/12/2017 5:28:32 PM By: Linton Ham MD Entered By: Linton Ham on 03/12/2017 11:47:42 [name], [name] ([id number]) -------------------------------------------------------------------------------- Physical Exam Details Patient Name: [name], [name] Date of Service: 03/12/2017 11:00 AM Medical Record Patient Account Number: [id number] Number: Treating RN: Montey Hora Mar 15, 1951 (65 y.o. Other Clinician: Date of Birth/Sex: Female) Treating Davione Lenker Primary Care Provider: Webb Silversmith Provider/Extender: G Referring Provider: Colon Flattery in Treatment: 9 Constitutional Patient is hypertensive.. Pulse regular and within target range for patient.[name] Respirations regular, non-labored and within target range.. Temperature is normal and within the target range for the patient.. Patient's appearance is neat and clean. Appears in no acute distress. Well nourished and well developed.[name] Respiratory Respiratory effort is easy and symmetric bilaterally. Rate is normal at rest and on room air.. Cardiovascular Femoral arteries without bruits and pulses strong.. Pedal pulses palpable and strong bilaterally.. Edema is minimal. Signs of chronic venous insufficiency probably secondary lymphedema superiorly. Lymphatic None palpable in the popliteal or inguinal area. Psychiatric No evidence of depression, anxiety, or agitation. Calm, cooperative, and communicative. Appropriate interactions and affect.. Electronic Signature(s) Signed:  03/12/2017 5:28:32 PM By: Linton Ham MD Entered By: Linton Ham on 03/12/2017  11:50:00 [name], [name] ([id number]) -------------------------------------------------------------------------------- Physician Orders Details Patient Name: [name], [name] Date of Service: 03/12/2017 11:00 AM Medical Record Patient Account Number: [id number] Number: Treating RN: Montey Hora [birth year] (65 y.o. Other Clinician: Date of Birth/Sex: Female) Treating Bishoy Cupp Primary Care Provider: Webb Silversmith Provider/Extender: G Referring Provider: Colon Flattery in Treatment: 9 Verbal / Phone Orders: No Diagnosis Coding Wound Cleansing Wound #6 Right Lower Leg o Clean wound with Normal Saline. Wound #7 Right,Medial Malleolus o Clean wound with Normal Saline. Anesthetic Wound #6 Right Lower Leg o Topical Lidocaine 4% cream applied to wound bed prior to debridement Wound #7 Right,Medial Malleolus o Topical Lidocaine 4% cream applied to wound bed prior to debridement Primary Wound Dressing Wound #6 Right Lower Leg o Hydrafera Blue Wound #7 Right,Medial Malleolus o Hydrafera Blue Secondary Dressing Wound #6 Right Lower Leg o Dry Gauze o Boardered Foam Dressing Wound #7 Right,Medial Malleolus o Dry Gauze o Boardered Foam Dressing Dressing Change Frequency Wound #6 Right Lower Leg o Change dressing every day. [name], [name] ([id number]) Wound #7 Right,Medial Malleolus o Change dressing every day. Follow-up Appointments Wound #6 Right Lower Leg o Return Appointment in 1 week. - with Dr. Dellia Nims Wound #7 Right,Medial Malleolus o Return Appointment in 1 week. - with Dr. Dellia Nims Edema Control Wound #6 Right Lower Leg o Patient to wear own compression stockings Wound #7 Right,Medial Malleolus o Patient to wear own compression stockings Electronic Signature(s) Signed: 03/12/2017 4:57:29 PM By: Montey Hora Signed: 03/12/2017 5:28:32 PM By: Linton Ham MD Entered By: Montey Hora on 03/12/2017 11:35:20 [name],  [name] ([id number]) -------------------------------------------------------------------------------- Problem List Details Patient Name: [name], [name] Date of Service: 03/12/2017 11:00 AM Medical Record Patient Account Number: [id number] Number: Treating RN: Montey Hora 1951-04-15 (65 y.o. Other Clinician: Date of Birth/Sex: Female) Treating Justise Ehmann Primary Care Provider: Webb Silversmith Provider/Extender: G Referring Provider: Colon Flattery in Treatment: 9 Active Problems ICD-10 Encounter Code Description Active Date Diagnosis I87.332 Chronic venous hypertension (idiopathic) with ulcer and 01/08/2017 Yes inflammation of left lower extremity L97.221 Non-pressure chronic ulcer of left calf limited to 01/08/2017 Yes breakdown of skin E11.42 Type 2 diabetes mellitus with diabetic polyneuropathy 01/08/2017 Yes E11.622 Type 2 diabetes mellitus with other skin ulcer 01/08/2017 Yes 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 ([id number]) -------------------------------------------------------------------------------- Progress Note Details Patient Name: [name], [name] Date of Service: 03/12/2017 11:00 AM Medical Record Patient Account Number: [id number] Number: Treating RN: Montey Hora 1951-10-03 (65 y.o. Other Clinician: Date of Birth/Sex: Female) Treating Zael Shuman Primary Care Provider: Webb Silversmith Provider/Extender: G Referring Provider: Colon Flattery in Treatment: 9 Subjective Chief Complaint Information obtained from Patient chronic venous hypertension with ulcer formation left lower extremity 01/08/17; patient presents today with ulcerations on the left  lateral lower leg History of Present Illness (HPI) The following HPI elements were documented for the patient's wound: Location: left lower extremity Severity: improved since her initial hospitalization but stable since Duration: 6 weeks Context: began after her third treatment of Remicade for rheumatoid arthritis Modifying Factors: morbid obesity, chronic venous hypertension, lymphedema, diabetes mellitus type 2, methotrexate use, Remicade use, Associated Signs and Symptoms: pain and swelling left lower extremity  she was seen last week where a 2 layer light compression system was applied. She removed that on Sunday because of itching. She states that she did see her primary care physician who has prescribed her a stronger diuretic and she will begin that today. When she removed her dressing she applied some Neosporin and some gauze. There remains considerable concern about her ability to keep the wrap in place for a week she has been prescribed lymphedema pumps but she does not use those either. We will encourage her to use those twice a day for an hour each time. These were prescribed to her by Dr. Hinton Lovely READMISSION 01/04/17; this is a patient who is a type II diabetic on oral agents. I note she was seen in this clinic 2-3 years ago. I was not involved in her care at that point. She tells [name] that she is had ulcers on her lateral left lower extremity since just before Christmas. There was no obvious precipitant to this. She apparently has been on long-term suppressive penicillin prescribed by Dr. 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 She has noted increasing erythema along with all of this and I think this is what prompted her to come in today. She does not have a known history of PAD or neuropathy ABI in this clinic was 1.06 on the left. The patient has not been requiring any  compression on her legs although she does have compression stockings at home as well as external compression pumps. She has been using neither of these. She has not been systemically unwell the patient is also known to the local vein and vascular group. Apparently her external compression pumps Golebiewski, Hadja D. ([id number]) were previously prescribed by Dr. Delana Meyer. She has had apparent bilateral ablations done in the past although I have none of these records. 01/15/17; the patient returns for rewrap of her left leg last week and reports that was put on too tight, she had to remove it on Sunday. She states this actually occurred her even though the previous 3 lateral wrap she was able to tolerate well. I don't think there is been much in the way of expansion of the erythema on the lateral left leg lateral left heel and lateral left foot. As mentioned previously I think this is chronic venous insufficiency and not really an active cellulitis. She has 2 small open areas in the lower left calf 01/22/17; the patient complains of really generalized pain. She has rheumatoid arthritis as well as fibromyalgia. She had to take the wrap off yesterday. She has small open areas on the lateral left heel and foot. 02/01/2017 -- the patient is seen by me today as she's had compression wraps for 10 days and I understand she did not realize she had to come back to see [name]. She did see Dr. Delana Meyer yesterday and his notes are not in, but the patient tells [name] that he is going to recommend a lymphedema pump and has not recommended any further venous workup or intervention. 02/06/17; the patient went to see Dr. Hinton Lovely of vascular surgery although I don't remember specifically being involved with this discussion. He felt she had chronic venous insufficiency without surgery or intervention necessary at this time. Also noted lymphedema and type 2 diabetes. In the meantime she did not tolerate the wrap on the right leg  stating that the small wound anteriorly" burns  like fire" 02/13/17; the lesions on the left lateral and left medial leg have closed over. Still looks a little vulnerable on the lateral aspect on the left Right; still an open area here but smaller and appears to be well granulated. Using Silver Collegen. She uses her own wrap on the right leg last week and I think week and continue that on both legs this week 02/26/17; the lesion on the left lateral leg and left medial leg are both closed over. Still to open areas on the right medial leg. She is been using her own stockings and the edema control looks adequate 03/12/17 she has no open areas on the left lateral or left medial leg. She still has an open area on the right medial leg and last week we do find an area over the right medial malleolus. She is been to see vascular surgery who did not recommend any further venous workup. She has lymphedema pumps which she uses although edema doesn't appear to be a big part of her current nonhealing state. She has been using [employer]isma Objective Constitutional Patient is hypertensive.. Pulse regular and within target range for patient.[name] Respirations regular, non-labored and within target range.. Temperature is normal and within the target range for the patient.. Patient's appearance is neat and clean. Appears in no acute distress. Well nourished and well developed.. Vitals Time Taken: 11:05 AM, Height: 67 in, Weight: 338 lbs, BMI: 52.9, Temperature: 98.3 F, Pulse: 74 bpm, Respiratory Rate: 18 breaths/min, Blood Pressure: 156/77 mmHg. Respiratory Respiratory effort is easy and symmetric bilaterally. Rate is normal at rest and on room air.. Cardiovascular Asare, Nakisha D. ([id number]) Femoral arteries without bruits and pulses strong.. Pedal pulses palpable and strong bilaterally.. Edema is minimal. Signs of chronic venous insufficiency probably secondary lymphedema superiorly. Lymphatic None palpable in the  popliteal or inguinal area. Psychiatric No evidence of depression, anxiety, or agitation. Calm, cooperative, and communicative. Appropriate interactions and affect.. Integumentary (Hair, Skin) Wound #6 status is Open. Original cause of wound was Trauma. The wound is located on the Right Lower Leg. The wound measures 1.1cm length x 1cm width x 0.1cm depth; 0.864cm^2 area and 0.086cm^3 volume. There is Fat Layer (Subcutaneous Tissue) Exposed exposed. There is no tunneling or undermining noted. There is a medium amount of serosanguineous drainage noted. The wound margin is indistinct and nonvisible. There is large (67-100%) red granulation within the wound bed. There is a small (1-33%) amount of necrotic tissue within the wound bed including Eschar. The periwound skin appearance exhibited: Excoriation, Ecchymosis. The periwound has tenderness on palpation. Wound #7 status is Open. Original cause of wound was Trauma. The wound is located on the Right,Medial Malleolus. The wound measures 1.2cm length x 0.8cm width x 0.1cm depth; 0.754cm^2 area and 0.075cm^3 volume. The wound is limited to skin breakdown. There is no tunneling or undermining noted. There is a large amount of serous drainage noted. The wound margin is flat and intact. There is large (67- 100%) pink granulation within the wound bed. There is a small (1-33%) amount of necrotic tissue within the wound bed including Adherent Slough. The periwound skin appearance did not exhibit: Callus, Crepitus, Excoriation, Induration, Rash, Scarring, Dry/Scaly, Maceration, Atrophie Blanche, Cyanosis, Ecchymosis, Hemosiderin Staining, Mottled, Pallor, Rubor, Erythema. The periwound has tenderness on palpation. 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 ([id number]) Plan Wound Cleansing: Wound #6 Right Lower Leg: Clean wound with Normal Saline. Wound #7 Right,Medial Malleolus: Clean wound with Normal Saline. Anesthetic: Wound #6 Right Lower Leg: Topical Lidocaine 4% cream applied to wound bed prior to debridement Wound #7 Right,Medial Malleolus: Topical Lidocaine 4% cream applied to wound bed prior to debridement Primary Wound Dressing: Wound #6 Right Lower Leg: Hydrafera Blue Wound #7 Right,Medial Malleolus: Hydrafera Blue Secondary Dressing: Wound #6 Right Lower Leg: Dry Gauze Boardered Foam Dressing Wound #7 Right,Medial Malleolus: Dry Gauze Boardered Foam Dressing Dressing Change Frequency: Wound #6 Right Lower Leg: Change dressing every day. Wound #7 Right,Medial Malleolus: Change dressing every day. Follow-up Appointments: Wound #6 Right Lower Leg: Return Appointment in 1 week. - with Dr. Dellia Nims Wound #7 Right,Medial Malleolus: Return Appointment in 1 week. - with Dr. Dellia Nims Edema Control: Wound #6 Right Lower Leg: Patient to wear own compression stockings Wound #7 Right,Medial Malleolus: Patient to wear own compression stockings #1 change the primary dressing to [facility] Blue to both areas #2 careful inspection of the skin between the wounds which in itself looks threatened Giacomo, Mallori D. ([id number]) #3 no evidence of infection Electronic Signature(s) Signed: 03/12/2017 5:28:32 PM By: Linton Ham MD Entered By: Linton Ham on 03/12/2017 11:53:35 Saban, Chandelle D. ([id number]) -------------------------------------------------------------------------------- SuperBill Details Patient Name: Boyden, Kenza D. Date of Service: 03/12/2017 Medical Record Patient Account Number:  [id number] Number: Treating RN: Montey Hora Aug 14, 1951 (65 y.o. Other Clinician: Date of Birth/Sex: Female) Treating Dillyn Menna, [place] Primary Care Provider: Webb Silversmith Provider/Extender: G Referring Provider: Colon Flattery in Treatment: 9 Diagnosis Coding ICD-10 Codes Code Description Chronic venous hypertension (idiopathic) with ulcer and inflammation of left lower I87.332 extremity L97.221 Non-pressure chronic ulcer of left calf limited to breakdown of skin E11.42 Type 2 diabetes mellitus with diabetic polyneuropathy E11.622 Type 2 diabetes mellitus with other skin ulcer L97.212 Non-pressure chronic ulcer of right calf with fat layer exposed I89.0 Lymphedema, not elsewhere classified Facility Procedures CPT4 Code: 16073710 Description: 99213 - WOUND CARE VISIT-LEV 3 EST PT Modifier: Quantity: 1 Physician Procedures CPT4: Description Modifier Quantity Code [id number] - WC PHYS LEVEL 3 - EST PT 1 ICD-10 Description Diagnosis I87.332 Chronic venous hypertension (idiopathic) with ulcer and inflammation of left lower extremity L97.212 Non-pressure chronic ulcer of  right calf with fat layer exposed Electronic Signature(s) Signed: 03/12/2017 2:20:58 PM By: Montey Hora Signed: 03/12/2017 5:28:32 PM By: Linton Ham MD Entered By: Montey Hora on 03/12/2017 14:20:58

## 2017-03-19 ENCOUNTER — Ambulatory Visit: Payer: BLUE CROSS/BLUE SHIELD | Admitting: Internal Medicine

## 2017-03-20 ENCOUNTER — Ambulatory Visit: Payer: BLUE CROSS/BLUE SHIELD | Admitting: Internal Medicine

## 2017-03-21 ENCOUNTER — Telehealth: Payer: Self-pay

## 2017-03-21 DIAGNOSIS — M1990 Unspecified osteoarthritis, unspecified site: Secondary | ICD-10-CM

## 2017-03-21 NOTE — Telephone Encounter (Signed)
Pt left v/m requesting rx oxycodone apap. Call when ready for pick up. Last printed # 120 on 02/20/17. Last seen 01/17/17.

## 2017-03-22 MED ORDER — OXYCODONE-ACETAMINOPHEN 10-325 MG PO TABS: 1.0000 | ORAL_TABLET | Freq: Four times a day (QID) | ORAL | 0 refills | Status: DC | PRN

## 2017-03-22 NOTE — Telephone Encounter (Signed)
RX printed and signed and placed in MYD box, will need to fill out CSA and UDS if she has not already

## 2017-03-25 NOTE — Telephone Encounter (Signed)
Patient called about status of rx.  Please can patient when rx is ready for pick up at [phone].

## 2017-03-25 NOTE — Telephone Encounter (Signed)
Rx left in front office for pick up and pt is aware  

## 2017-03-26 ENCOUNTER — Encounter: Payer: Self-pay | Admitting: Internal Medicine

## 2017-03-26 ENCOUNTER — Ambulatory Visit: Payer: BLUE CROSS/BLUE SHIELD | Admitting: Internal Medicine

## 2017-03-27 ENCOUNTER — Encounter: Payer: Self-pay | Admitting: Internal Medicine

## 2017-03-27 ENCOUNTER — Other Ambulatory Visit: Payer: Self-pay | Admitting: Internal Medicine

## 2017-03-27 ENCOUNTER — Ambulatory Visit: Payer: BLUE CROSS/BLUE SHIELD | Admitting: Internal Medicine

## 2017-04-22 ENCOUNTER — Other Ambulatory Visit: Payer: Self-pay

## 2017-04-22 DIAGNOSIS — M1990 Unspecified osteoarthritis, unspecified site: Secondary | ICD-10-CM

## 2017-04-22 NOTE — Telephone Encounter (Signed)
Pt left v/m requesting rx oxycodone apap. Call when ready for pick up. Last printed # 120 on 03/22/17. Last seen 01/17/17.

## 2017-04-22 NOTE — Telephone Encounter (Signed)
Call pt:  How many times a day is she taking it. I need information so that we can start the weaning process.

## 2017-04-23 MED ORDER — OXYCODONE-ACETAMINOPHEN 10-325 MG PO TABS: 1.0000 | ORAL_TABLET | Freq: Three times a day (TID) | ORAL | 0 refills | Status: DC | PRN

## 2017-04-23 NOTE — Telephone Encounter (Signed)
Rx left in front office for pick up and pt is aware  

## 2017-04-23 NOTE — Telephone Encounter (Signed)
Pt reports she is taking meds 4 times daily

## 2017-04-23 NOTE — Telephone Encounter (Signed)
Ok, we are going to wean down to 3 x day. She can take Tylenol in place of the 4th time she was taking it. RX printed and signed.

## 2017-04-25 ENCOUNTER — Ambulatory Visit (INDEPENDENT_AMBULATORY_CARE_PROVIDER_SITE_OTHER): Payer: BLUE CROSS/BLUE SHIELD | Admitting: Vascular Surgery

## 2017-04-25 NOTE — Progress Notes (Deleted)
MRN : [id number]  [name] [name] is a 66 y.o. ([birth year]) female who presents with chief complaint of No chief complaint on file. [name]  History of Present Illness: The patient returns to the office for followup evaluation regarding leg swelling.  The swelling has improved quite a bit and the pain associated with swelling has decreased substantially. There have not been any interval development of a ulcerations or wounds.  Since the previous visit the patient has been wearing graduated compression stockings and has noted little significant improvement in the lymphedema. The patient has been using compression routinely morning until night.  The patient also states elevation during the day and exercise is being done too.     No outpatient prescriptions have been marked as taking for the 04/25/17 encounter (Appointment) with Delana Meyer, Dolores Lory, MD.    Past Medical History:  Diagnosis Date  . Arthritis    Rhumetoid arthritis  . Cellulitis of both lower extremities    Chronic  . CHF (congestive heart failure) ([place])   . Coronary artery disease   . Diabetes mellitus without complication ([place])   . Fibromyalgia   . Fibromyalgia affecting shoulder region   . Hyperlipidemia   . Hypertension   . Spinal stenosis     Past Surgical History:  Procedure Laterality Date  . ANAL FISSURECTOMY  01/2004  . HERNIA REPAIR    . MEDIAL PARTIAL KNEE REPLACEMENT  01/06/2014  . UMBILICAL HERNIA REPAIR N/A 09/05/2015   Procedure: HERNIA REPAIR incarcerated UMBILICAL ADULT;  Surgeon: Sherri Rad, MD;  Location: [facility] ORS;  Service: General;  Laterality: N/A;  . Vascular Stent  11/04/2013    Social History Social History  Substance Use Topics  . Smoking status: Former Smoker    Quit date: 12/03/1996  . Smokeless tobacco: Never Used  . Alcohol use No    Family History Family History  Problem Relation Age of Onset  . Hypertension Mother   . Cataracts Mother   . Thyroid disease Mother   . Dementia  Mother   . Heart failure Mother   . COPD Father   . Dementia Father   . Cataracts Father   . Aneurysm Father        Abdominal & Brain  . Breast cancer Sister   . Fibromyalgia Sister   . Migraines Sister   . Hypertension Sister   . Breast cancer Sister   . COPD Sister   . Uterine cancer Sister   . Migraines Brother   . Heart attack Brother     Allergies  Allergen Reactions  . Latex     itching  . Doxycycline Rash  . Sulfa Antibiotics Itching, Rash and Swelling     REVIEW OF SYSTEMS (Negative unless checked)  Constitutional: [] Weight loss  [] Fever  [] Chills Cardiac: [] Chest pain   [] Chest pressure   [] Palpitations   [] Shortness of breath when laying flat   [] Shortness of breath with exertion. Vascular:  [] Pain in legs with walking   [] Pain in legs at rest  [] History of DVT   [] Phlebitis   [] Swelling in legs   [] Varicose veins   [] Non-healing ulcers Pulmonary:   [] Uses home oxygen   [] Productive cough   [] Hemoptysis   [] Wheeze  [] COPD   [] Asthma Neurologic:  [] Dizziness   [] Seizures   [] History of stroke   [] History of TIA  [] Aphasia   [] Vissual changes   [] Weakness or numbness in arm   [] Weakness or numbness in leg Musculoskeletal:   [] Joint  swelling   [] Joint pain   [] Low back pain Hematologic:  [] Easy bruising  [] Easy bleeding   [] Hypercoagulable state   [] Anemic Gastrointestinal:  [] Diarrhea   [] Vomiting  [] Gastroesophageal reflux/heartburn   [] Difficulty swallowing. Genitourinary:  [] Chronic kidney disease   [] Difficult urination  [] Frequent urination   [] Blood in urine Skin:  [] Rashes   [] Ulcers  Psychological:  [] History of anxiety   []  History of major depression.  Physical Examination  There were no vitals filed for this visit. There is no height or weight on file to calculate BMI. Gen: WD/WN, NAD Head: [place]/AT, No temporalis wasting.  Ear/Nose/Throat: Hearing grossly intact, nares w/o erythema or drainage Eyes: PER, EOMI, sclera nonicteric.  Neck: Supple, no  large masses.   Pulmonary:  Good air movement, no audible wheezing bilaterally, no use of accessory muscles.  Cardiac: RRR, no JVD Vascular: *** Vessel Right Left  Radial Palpable Palpable  Ulnar Palpable Palpable  Brachial Palpable Palpable  Carotid Palpable Palpable  Femoral Palpable Palpable  Popliteal Palpable Palpable  PT Palpable Palpable  DP Palpable Palpable  Gastrointestinal: Non-distended. No guarding/no peritoneal signs.  Musculoskeletal: M/S 5/5 throughout.  No deformity or atrophy.  Neurologic: CN 2-12 intact. Symmetrical.  Speech is fluent. Motor exam as listed above. Psychiatric: Judgment intact, Mood & affect appropriate for pt's clinical situation. Dermatologic: No rashes or ulcers noted.  No changes consistent with cellulitis. Lymph : No lichenification or skin changes of chronic lymphedema.  CBC Lab Results  Component Value Date   WBC 7.4 12/13/2016   HGB 11.9 (L) 12/13/2016   HCT 36.1 12/13/2016   MCV 80.3 12/13/2016   PLT 317.0 12/13/2016    BMET    Component Value Date/Time   NA 136 12/13/2016 1056   NA 138 08/31/2014 0533   K 4.4 12/13/2016 1056   K 4.0 08/31/2014 0533   CL 97 12/13/2016 1056   CL 104 08/31/2014 0533   CO2 31 12/13/2016 1056   CO2 28 08/31/2014 0533   GLUCOSE 159 (H) 12/13/2016 1056   GLUCOSE 116 (H) 08/31/2014 0533   BUN 13 12/13/2016 1056   BUN 12 08/31/2014 0533   CREATININE 0.57 12/13/2016 1056   CREATININE 0.63 08/31/2014 0533   CALCIUM 9.3 12/13/2016 1056   CALCIUM 8.2 (L) 08/31/2014 0533   GFRNONAA >60 09/04/2015 2209   GFRNONAA >60 08/31/2014 0533   GFRNONAA >60 11/05/2013 0647   GFRAA >60 09/04/2015 2209   GFRAA >60 08/31/2014 0533   GFRAA >60 11/05/2013 0647   CrCl cannot be calculated (Patient's most recent lab result is older than the maximum 21 days allowed.).  COAG No results found for: INR, PROTIME  Radiology No results found.  Assessment/Plan 1. Chronic venous insufficiency ***  2. 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 Lymphedema ***  4. Essential hypertension ***  5. Type 2 diabetes mellitus without complication, without long-term current use of insulin (HCC) ***    Hortencia Pilar, MD  04/25/2017 8:47 AM

## 2017-05-14 ENCOUNTER — Ambulatory Visit (INDEPENDENT_AMBULATORY_CARE_PROVIDER_SITE_OTHER): Payer: BLUE CROSS/BLUE SHIELD | Admitting: Internal Medicine

## 2017-05-14 ENCOUNTER — Encounter: Payer: Self-pay | Admitting: Internal Medicine

## 2017-05-14 VITALS — BP 146/94 | HR 96 | Temp 97.9°F | Wt 337.0 lb

## 2017-05-14 DIAGNOSIS — M7521 Bicipital tendinitis, right shoulder: Secondary | ICD-10-CM | POA: Diagnosis not present

## 2017-05-14 MED ORDER — PREDNISONE 10 MG PO TABS: ORAL_TABLET | ORAL | 0 refills | Status: DC

## 2017-05-14 NOTE — Progress Notes (Signed)
Subjective:    Patient ID: [name] [name], female    DOB: 11/25/1951, 66 y.o.   MRN: [id number]  HPI  Pt presents to the clinic today with c/o right arm pain. This started 6 months ago. She describes the pains as achy and throbbing. The pain radiates into her elbow. She has noticed some associated swelling but denies redness or warmth. She denies numbness or tingling in her right arm. She denies any injury to the area. She is taking Percocet but reports that only takes the edge off, it does not take away the pain.  Review of Systems      Past Medical History:  Diagnosis Date  . Arthritis    Rhumetoid arthritis  . Cellulitis of both lower extremities    Chronic  . CHF (congestive heart failure) ([place])   . Coronary artery disease   . Diabetes mellitus without complication ([place])   . Fibromyalgia   . Fibromyalgia affecting shoulder region   . Hyperlipidemia   . Hypertension   . Spinal stenosis     Current Outpatient Prescriptions  Medication Sig Dispense Refill  . aspirin 81 MG tablet Take 1 tablet (81 mg total) by mouth daily. 30 tablet 2  . carvedilol (COREG) 3.125 MG tablet Take 1 tablet (3.125 mg total) by mouth 2 (two) times daily with a meal. 60 tablet 2  . cetirizine (ZYRTEC) 10 MG tablet TAKE 1 TABLET (10 MG TOTAL) BY MOUTH DAILY. (NOT COVER*) 30 tablet 2  . citalopram (CELEXA) 40 MG tablet Take 40 mg by mouth daily.  3  . glipiZIDE (GLUCOTROL) 5 MG tablet Take 1 tablet (5 mg total) by mouth 2 (two) times daily before a meal. 60 tablet 2  . glucose blood (ACCU-CHEK AVIVA PLUS) test strip To check blood sugar once a day.  DX: E11.9 100 each 1  . losartan (COZAAR) 50 MG tablet Take 1 tablet (50 mg total) by mouth daily. 30 tablet 1  . meloxicam (MOBIC) 15 MG tablet Take 1 tablet (15 mg total) by mouth daily. 30 tablet 2  . omeprazole (PRILOSEC) 20 MG capsule Take 1 capsule (20 mg total) by mouth daily. 30 capsule 2  . oxyCODONE-acetaminophen (PERCOCET) 10-325 MG tablet Take  1 tablet by mouth every 8 (eight) hours as needed. 90 tablet 0  . Wound Dressings (ATRAPRO HYDROGEL) GEL Apply 1 application topically daily. 1 Tube 2   No current facility-administered medications for this visit.     Allergies  Allergen Reactions  . Latex     itching  . Doxycycline Rash  . Sulfa Antibiotics Itching, Rash and Swelling    Family History  Problem Relation Age of Onset  . Hypertension Mother   . Cataracts Mother   . Thyroid disease Mother   . Dementia Mother   . Heart failure Mother   . COPD Father   . Dementia Father   . Cataracts Father   . Aneurysm Father        Abdominal & Brain  . Breast cancer Sister   . Fibromyalgia Sister   . Migraines Sister   . Hypertension Sister   . Breast cancer Sister   . COPD Sister   . Uterine cancer Sister   . Migraines Brother   . Heart attack Brother     Social History   Social History  . Marital status: Married    Spouse name: N/A  . Number of children: 2  . Years of education: 9th Grade  Occupational History  . Disabled    Social History Main Topics  . Smoking status: Former Smoker    Quit date: 12/03/1996  . Smokeless tobacco: Never Used  . Alcohol use No  . Drug use: No  . Sexual activity: Yes    Birth control/ protection: None   Other Topics Concern  . Not on file   Social History Narrative  . No narrative on file     Constitutional: Denies fever, malaise, fatigue, headache or abrupt weight changes.  Musculoskeletal: Pt reports right arm pain. Denies decrease in range of motion, difficulty with gait,  or joint pain and swelling.    No other specific complaints in a complete review of systems (except as listed in HPI above).  Objective:   Physical Exam  BP (!) 146/94   Pulse 96   Temp 97.9 F (36.6 C) (Oral)   Wt (!) 337 lb (152.9 kg)   LMP  (LMP Unknown)   SpO2 94%   BMI 52.00 kg/m  Wt Readings from Last 3 Encounters:  05/14/17 (!) 337 lb (152.9 kg)  01/31/17 (!) 355 lb (161 kg)    01/17/17 (!) 354 lb (160.6 kg)    General: Appears her stated age, obese in NAD. Skin: No redness or warmth noted of right upper arm. Musculoskeletal:Decreased internal rotation of right shoulder. Normal external rotation. No pain with palpation of the shoulder joint. Pain with palpation over the biceps tendon. Strength 5/5 BUE. Neurological: Alert and oriented. Sensation intact to BUE.  BMET    Component Value Date/Time   NA 136 12/13/2016 1056   NA 138 08/31/2014 0533   K 4.4 12/13/2016 1056   K 4.0 08/31/2014 0533   CL 97 12/13/2016 1056   CL 104 08/31/2014 0533   CO2 31 12/13/2016 1056   CO2 28 08/31/2014 0533   GLUCOSE 159 (H) 12/13/2016 1056   GLUCOSE 116 (H) 08/31/2014 0533   BUN 13 12/13/2016 1056   BUN 12 08/31/2014 0533   CREATININE 0.57 12/13/2016 1056   CREATININE 0.63 08/31/2014 0533   CALCIUM 9.3 12/13/2016 1056   CALCIUM 8.2 (L) 08/31/2014 0533   GFRNONAA >60 09/04/2015 2209   GFRNONAA >60 08/31/2014 0533   GFRNONAA >60 11/05/2013 0647   GFRAA >60 09/04/2015 2209   GFRAA >60 08/31/2014 0533   GFRAA >60 11/05/2013 0647    Lipid Panel     Component Value Date/Time   CHOL 272 (H) 12/13/2016 1056   TRIG 254.0 (H) 12/13/2016 1056   HDL 44.80 12/13/2016 1056   CHOLHDL 6 12/13/2016 1056   VLDL 50.8 (H) 12/13/2016 1056    CBC    Component Value Date/Time   WBC 7.4 12/13/2016 1056   RBC 4.49 12/13/2016 1056   HGB 11.9 (L) 12/13/2016 1056   HGB 11.0 (L) 08/31/2014 0533   HCT 36.1 12/13/2016 1056   HCT 33.9 (L) 08/31/2014 0533   PLT 317.0 12/13/2016 1056   PLT 230 08/31/2014 0533   MCV 80.3 12/13/2016 1056   MCV 86 08/31/2014 0533   MCH 26.6 09/04/2015 2209   MCHC 33.1 12/13/2016 1056   RDW 15.5 12/13/2016 1056   RDW 16.3 (H) 08/31/2014 0533   LYMPHSABS 1.4 08/31/2014 0533   MONOABS 0.5 08/31/2014 0533   EOSABS 0.2 08/31/2014 0533   BASOSABS 0.0 08/31/2014 0533    Hgb A1C Lab Results  Component Value Date   HGBA1C 7.4 (H) 12/13/2016             Assessment &  Plan:   Biceps Tendonitis, Right Arm:  Avoid overuse eRx for Pred taper x 9 days, no Ibuprofen or Meloxicam Monitor sugar while on Prednisone Shoulder exercises given  RTC as needed or if symptoms persist or worsen BAITY, REGINA, NP

## 2017-05-14 NOTE — Patient Instructions (Addendum)
Biceps Tendon Tendinitis (Distal) Distal biceps tendon tendinitis is inflammation of the distal biceps tendon. The distal biceps tendon is a strong cord of tissue that connects the biceps muscle, on the front of the upper arm, to a bone (radius) in the elbow. Distal biceps tendon tendinitis can interfere with the ability to bend the elbow and turn the hand palm-up (supination). This condition is usually caused by overusing the elbow joint and the biceps muscle, and it usually heals within 6 weeks. Distal biceps tendon tendinitis may include a grade 1 or grade 2 strain of the tendon. A grade 1 strain is mild, and it involves a slight pull of the tendon without any stretching or noticeable tearing of the tendon. There is usually no loss of biceps muscle strength. A grade 2 strain is moderate, and it involves a small tear in the tendon. The tendon is stretched, and biceps muscle strength is usually decreased. What are the causes? This condition may be caused by:  A sudden increase in the frequency or intensity of activity that involves the elbow and the biceps muscle.  Overuse of the biceps muscle. This can happen when you do the same movements over and over, such as: ? Supination. ? Forceful straightening (hyperextension) of the elbow. ? Bending of the elbow.  A direct, forceful hit or injury (trauma) to the elbow. This is rare.  What increases the risk? The following factors may make you more likely to develop this condition:  Playing contact sports.  Playing sports that involve throwing and overhead movements, including racket sports, gymnastics, weight lifting, or bodybuilding.  Doing physical labor.  Having poor strength and flexibility of the arm and shoulder.  Having injured other parts of the elbow.  What are the signs or symptoms? Symptoms of this condition may include:  Pain and inflammation in the front of the elbow. Pain may get worse during certain movements, such  as: ? Supination. ? Bending the elbow. ? Lifting or carrying objects. ? Throwing.  A feeling of warmth in the front of the elbow.  A crackling sound (crepitation) when you move or touch the elbow or the upper arm.  In some cases, symptoms may return (recur) after treatment, and they may be long-lasting (chronic). How is this diagnosed? This condition is diagnosed based on your symptoms, your medical history, and a physical exam. You may have tests, including X-rays or MRIs. Your health care provider may test your range of motion by having you do arm movements. How is this treated? This condition is treated by resting and icing the injured area, and by doing physical therapy exercises. Depending on the severity of your condition, treatment may also include:  Medicines to help relieve pain and inflammation.  Ultrasound therapy. This is the application of sound waves to the injured area.  Follow these instructions at home: Managing pain, stiffness, and swelling  If directed, put ice on the injured area: ? Put ice in a plastic bag. ? Place a towel between your skin and the bag. ? Leave the ice on for 20 minutes, 2-3 times a day.  Move your fingers often to avoid stiffness and to lessen swelling.  Raise (elevate) the injured area above the level of your heart while you are sitting or lying down.  If directed, apply heat to the affected area before you exercise. Use the heat source that your health care provider recommends, such as a moist heat pack or a heating pad. ? Place a towel between   your skin and the heat source. ? Leave the heat on for 20-30 minutes. ? Remove the heat if your skin turns bright red. This is especially important if you are unable to feel pain, heat, or cold. You may have a greater risk of getting burned. Activity  Return to your normal activities as told by your health care provider. Ask your health care provider what activities are safe for you.  Do not lift  anything that is heavier than 10 lb (4.5 kg) until your health care provider tells you that it is safe.  Avoid activities that cause pain or make your condition worse.  Do exercises as told by your health care provider. General instructions  Take over-the-counter and prescription medicines only as told by your health care provider.  Do not drive or operate heavy machinery while taking prescription pain medicines.  Keep all follow-up visits as told by your health care provider. This is important. How is this prevented?  Warm up and stretch before being active.  Cool down and stretch after being active.  Give your body time to rest between periods of activity.  Make sure to use equipment that fits you.  Be safe and responsible while being active to avoid falls.  Do at least 150 minutes of moderate-intensity exercise each week, such as brisk walking or water aerobics.  Maintain physical fitness, including: ? Strength. ? Flexibility. ? Cardiovascular fitness. ? Endurance. Contact a health care provider if:  You have symptoms that get worse or do not get better after 2 weeks of treatment.  You develop new symptoms. Get help right away if:  You develop severe pain. This information is not intended to replace advice given to you by your health care provider. Make sure you discuss any questions you have with your health care provider. Document Released: 11/19/2005 Document Revised: 07/26/2016 Document Reviewed: 10/28/2015 Elsevier Interactive Patient Education  2018 [employer]  

## 2017-05-23 ENCOUNTER — Other Ambulatory Visit: Payer: Self-pay

## 2017-05-23 DIAGNOSIS — M1990 Unspecified osteoarthritis, unspecified site: Secondary | ICD-10-CM

## 2017-05-23 MED ORDER — OXYCODONE-ACETAMINOPHEN 10-325 MG PO TABS: 1.0000 | ORAL_TABLET | Freq: Three times a day (TID) | ORAL | 0 refills | Status: DC | PRN

## 2017-05-23 NOTE — Telephone Encounter (Signed)
RX printed and signed and placed in MYD box 

## 2017-05-23 NOTE — Telephone Encounter (Signed)
Pt left v/m requesting rx oxycodone apap. Call when ready for pick up. Last printed # 90 on 04/23/17. Last seen 05/14/17. Since 31 days in May pt request to be able to fill med on 05/25/17.Please advise.

## 2017-05-24 NOTE — Telephone Encounter (Signed)
Rx left in front office for pick up and pt is aware  

## 2017-06-20 ENCOUNTER — Other Ambulatory Visit: Payer: Self-pay

## 2017-06-20 DIAGNOSIS — M1990 Unspecified osteoarthritis, unspecified site: Secondary | ICD-10-CM

## 2017-06-20 MED ORDER — OXYCODONE-ACETAMINOPHEN 10-325 MG PO TABS: 1.0000 | ORAL_TABLET | Freq: Three times a day (TID) | ORAL | 0 refills | Status: DC | PRN

## 2017-06-20 NOTE — Telephone Encounter (Signed)
Pt left v/m requesting rx oxycodone apap. Call when ready for pick up. Last printed # 90 on 05/23/17. Last seen 05/14/17.

## 2017-06-20 NOTE — Telephone Encounter (Signed)
RX printed and signed and placed in MYD box 

## 2017-06-21 NOTE — Telephone Encounter (Signed)
Rx left in front office for pick up and pt is aware  

## 2017-07-03 DIAGNOSIS — Z1371 Encounter for nonprocreative screening for genetic disease carrier status: Secondary | ICD-10-CM

## 2017-07-03 DIAGNOSIS — Z803 Family history of malignant neoplasm of breast: Secondary | ICD-10-CM

## 2017-07-03 HISTORY — DX: Encounter for nonprocreative screening for genetic disease carrier status: Z13.71

## 2017-07-03 HISTORY — DX: Family history of malignant neoplasm of breast: Z80.3

## 2017-07-18 ENCOUNTER — Encounter: Payer: Self-pay | Admitting: Family Medicine

## 2017-07-18 ENCOUNTER — Ambulatory Visit (INDEPENDENT_AMBULATORY_CARE_PROVIDER_SITE_OTHER): Payer: BLUE CROSS/BLUE SHIELD | Admitting: Family Medicine

## 2017-07-18 VITALS — BP 160/102 | HR 100 | Temp 98.3°F | Resp 22 | Ht 68.0 in | Wt 338.0 lb

## 2017-07-18 DIAGNOSIS — Z0000 Encounter for general adult medical examination without abnormal findings: Secondary | ICD-10-CM

## 2017-07-18 DIAGNOSIS — E7800 Pure hypercholesterolemia, unspecified: Secondary | ICD-10-CM | POA: Diagnosis not present

## 2017-07-18 DIAGNOSIS — I10 Essential (primary) hypertension: Secondary | ICD-10-CM

## 2017-07-18 DIAGNOSIS — Z1159 Encounter for screening for other viral diseases: Secondary | ICD-10-CM

## 2017-07-18 DIAGNOSIS — I5022 Chronic systolic (congestive) heart failure: Secondary | ICD-10-CM | POA: Diagnosis not present

## 2017-07-18 DIAGNOSIS — G8929 Other chronic pain: Secondary | ICD-10-CM | POA: Diagnosis not present

## 2017-07-18 DIAGNOSIS — Z114 Encounter for screening for human immunodeficiency virus [HIV]: Secondary | ICD-10-CM | POA: Diagnosis not present

## 2017-07-18 DIAGNOSIS — E11628 Type 2 diabetes mellitus with other skin complications: Secondary | ICD-10-CM | POA: Diagnosis not present

## 2017-07-18 DIAGNOSIS — M0579 Rheumatoid arthritis with rheumatoid factor of multiple sites without organ or systems involvement: Secondary | ICD-10-CM | POA: Diagnosis not present

## 2017-07-18 DIAGNOSIS — M1990 Unspecified osteoarthritis, unspecified site: Secondary | ICD-10-CM | POA: Diagnosis not present

## 2017-07-18 DIAGNOSIS — Z1231 Encounter for screening mammogram for malignant neoplasm of breast: Secondary | ICD-10-CM

## 2017-07-18 DIAGNOSIS — Z1211 Encounter for screening for malignant neoplasm of colon: Secondary | ICD-10-CM

## 2017-07-18 DIAGNOSIS — M25511 Pain in right shoulder: Secondary | ICD-10-CM | POA: Diagnosis not present

## 2017-07-18 DIAGNOSIS — N950 Postmenopausal bleeding: Secondary | ICD-10-CM

## 2017-07-18 MED ORDER — GLIPIZIDE 5 MG PO TABS: 5.0000 mg | ORAL_TABLET | Freq: Two times a day (BID) | ORAL | 2 refills | Status: DC

## 2017-07-18 MED ORDER — OXYCODONE-ACETAMINOPHEN 10-325 MG PO TABS: 1.0000 | ORAL_TABLET | Freq: Three times a day (TID) | ORAL | 0 refills | Status: DC | PRN

## 2017-07-18 MED ORDER — LOSARTAN POTASSIUM 50 MG PO TABS
50.0000 mg | ORAL_TABLET | Freq: Every day | ORAL | 1 refills | Status: DC
Start: 2017-07-18 — End: 2017-11-14

## 2017-07-18 MED ORDER — FUROSEMIDE 20 MG PO TABS: 20.0000 mg | ORAL_TABLET | Freq: Every day | ORAL | 1 refills | Status: DC

## 2017-07-18 MED ORDER — GABAPENTIN 100 MG PO CAPS: 100.0000 mg | ORAL_CAPSULE | Freq: Three times a day (TID) | ORAL | 3 refills | Status: DC

## 2017-07-18 MED ORDER — CARVEDILOL 3.125 MG PO TABS
3.1250 mg | ORAL_TABLET | Freq: Two times a day (BID) | ORAL | 2 refills | Status: DC
Start: 2017-07-18 — End: 2017-11-14

## 2017-07-18 NOTE — Progress Notes (Signed)
Patient: [name] [name], Female    DOB: November 15, 1951, 66 y.o.   MRN: [id number] Visit Date: 07/18/2017  Today's Provider: Lavon Paganini, MD   Chief Complaint  Patient presents with  . Annual Exam   Subjective:    Annual physical exam [name] [name] is a 66 y.o. female who presents today for health maintenance and complete physical. She feels poorly. She reports she is not exercising due to RA and fibromyalgia. She reports she is sleeping fairly well.  Last mammogram- 11/28/2011- BI-RADS 2 Last colonoscopy - unsure Last pap smear - unsure - states she didn't think she needed one as she is not currently sexually active ----------------------------------------------------------------- [name] [name] is reestablishing care at [facility]. She switched PCP's to [facility] after Dr. Venia Minks relocated. She was not happy with her provider because she decreased her Oxycodone use, as well as injuring her tendon (per patient) after giving her Pneumovax injection. She states ongoing pain in R upper arm with any movement x6 months.  She was treated with course of prednisone that only helped a little bit.  She has had no imaging.  She has a H/O DM. Last A1C was January 2018, and was 7.4%. Her last eye exam was February 2017 at the [facility]. She ran out of most of her medications, including Carvedilol, Glipizide, Losartan and Meloxicam. States that she is going to be seeing cardiology for CHF.  Vaginal bleeding - intermittent every few months for the last 12-16 months - it is similar to how her periods used to be, but she went through menopause ~10 yrs ago. - she has never been worked up for this - she has 2 nieces that have endometrial hyperplasia       Review of Systems  Constitutional: Positive for fatigue.  HENT: Positive for dental problem and sinus pressure.   Cardiovascular: Positive for leg swelling.  Gastrointestinal: Positive for  abdominal distention, abdominal pain, constipation and nausea.  Endocrine: Positive for cold intolerance, heat intolerance, polydipsia and polyuria.  Genitourinary: Positive for enuresis, frequency and urgency.  Musculoskeletal: Positive for arthralgias, back pain, joint swelling, myalgias, neck pain and neck stiffness.  Skin: Positive for rash.  Allergic/Immunologic: Positive for environmental allergies.  Neurological: Positive for dizziness, weakness, light-headedness, numbness and headaches.  Hematological: Bruises/bleeds easily.  Psychiatric/Behavioral: Positive for decreased concentration.  All other systems reviewed and are negative.   Social History      She  reports that she quit smoking about 20 years ago. She has never used smokeless tobacco. She reports that she does not drink alcohol or use drugs.       Social History   Social History  . Marital status: Married    Spouse name: [name] [name]  . Number of children: 1  . Years of education: 9th Grade   Occupational History  . Disabled    Social History Main Topics  . Smoking status: Former Smoker    Quit date: 12/03/1996  . Smokeless tobacco: Never Used  . Alcohol use No  . Drug use: No  . Sexual activity: Not Currently   Other Topics Concern  . None   Social History Narrative  . None    Past Medical History:  Diagnosis Date  . Arthritis    Rhumetoid arthritis  . Cellulitis of both lower extremities    Chronic  . CHF (congestive heart failure) ([place])   . Coronary artery disease   . Diabetes mellitus  without complication ([place])   . Fibromyalgia   . Fibromyalgia affecting shoulder region   . Hyperlipidemia   . Hypertension   . Spinal stenosis      Patient Active Problem List   Diagnosis Date Noted  . Lymphedema 01/31/2017  . Pure hypercholesterolemia 12/14/2016  . HTN (hypertension) 12/14/2016  . Chronic systolic heart failure ([place]) 10/06/2015  . Acute non-ST elevation myocardial infarction (NSTEMI) ([place])  09/20/2015  . Allergic rhinitis 07/06/2015  . Chronic venous insufficiency 07/06/2015  . Atherosclerosis of coronary artery 07/06/2015  . Clinical depression 07/06/2015  . Diabetes mellitus, type 2 ([place]) 07/06/2015  . Venous ulcer of ankle ([place]) 07/06/2015  . Gastroduodenal ulcer 04/08/2014  . Rheumatoid arthritis ([place]) 04/06/2014    Past Surgical History:  Procedure Laterality Date  . ABLATION SAPHENOUS VEIN W/ RFA    . ANAL FISSURECTOMY  01/2004  . HERNIA REPAIR    . MEDIAL PARTIAL KNEE REPLACEMENT  01/06/2014  . UMBILICAL HERNIA REPAIR N/A 09/05/2015   Procedure: HERNIA REPAIR incarcerated UMBILICAL ADULT;  Surgeon: Sherri Rad, MD;  Location: [facility] ORS;  Service: General;  Laterality: N/A;  . Vascular Stent  11/04/2013    Family History        Family Status  Relation Status  . Mother Alive  . Father Deceased at age 59  . Sister Alive  . Sister Alive  . Brother Alive  . Brother Alive  . Sister Alive        Her family history includes Aneurysm in her father; Breast cancer in her sister and sister; COPD in her father and sister; Cataracts in her father and mother; Dementia in her father and mother; Diabetes in her father; Fibromyalgia in her sister; Heart attack in her brother; Hypertension in her mother and sister; Migraines in her brother and sister; Ovarian cancer in her sister; Thyroid disease in her mother.     Allergies  Allergen Reactions  . Latex     itching  . Doxycycline Rash  . Sulfa Antibiotics Itching, Rash and Swelling     Current Outpatient Prescriptions:  .  aspirin 81 MG tablet, Take 1 tablet (81 mg total) by mouth daily., Disp: 30 tablet, Rfl: 2 .  citalopram (CELEXA) 40 MG tablet, Take 40 mg by mouth daily., Disp: , Rfl: 3 .  furosemide (LASIX) 20 MG tablet, Take 1 tablet by mouth daily., Disp: , Rfl: 1 .  oxyCODONE-acetaminophen (PERCOCET) 10-325 MG tablet, Take 1 tablet by mouth every 8 (eight) hours as needed., Disp: 90 tablet, Rfl: 0 .  carvedilol  (COREG) 3.125 MG tablet, Take 1 tablet (3.125 mg total) by mouth 2 (two) times daily with a meal. (Patient not taking: Reported on 07/18/2017), Disp: 60 tablet, Rfl: 2 .  cetirizine (ZYRTEC) 10 MG tablet, TAKE 1 TABLET (10 MG TOTAL) BY MOUTH DAILY. (NOT COVER*) (Patient not taking: Reported on 07/18/2017), Disp: 30 tablet, Rfl: 2 .  glipiZIDE (GLUCOTROL) 5 MG tablet, Take 1 tablet (5 mg total) by mouth 2 (two) times daily before a meal. (Patient not taking: Reported on 07/18/2017), Disp: 60 tablet, Rfl: 2 .  glucose blood (ACCU-CHEK AVIVA PLUS) test strip, To check blood sugar once a day.  DX: E11.9 (Patient not taking: Reported on 07/18/2017), Disp: 100 each, Rfl: 1 .  losartan (COZAAR) 50 MG tablet, Take 1 tablet (50 mg total) by mouth daily. (Patient not taking: Reported on 07/18/2017), Disp: 30 tablet, Rfl: 1 .  meloxicam (MOBIC) 15 MG tablet, Take 1 tablet (15 mg  total) by mouth daily. (Patient not taking: Reported on 07/18/2017), Disp: 30 tablet, Rfl: 2   Patient Care Team: Virginia Crews, MD as PCP - General (Family Medicine)      Objective:   Vitals: BP (!) 160/102 (BP Location: Left Wrist, Patient Position: Sitting, Cuff Size: Normal)   Pulse 100   Temp 98.3 F (36.8 C) (Oral)   Resp (!) 22   Ht 5\' 8"  (1.727 m)   Wt (!) 338 lb (153.3 kg)   LMP  (LMP Unknown)   BMI 51.39 kg/m    Vitals:   07/18/17 1036  BP: (!) 160/102  Pulse: 100  Resp: (!) 22  Temp: 98.3 F (36.8 C)  TempSrc: Oral  Weight: (!) 338 lb (153.3 kg)  Height: 5\' 8"  (1.727 m)     Physical Exam  Constitutional: She is oriented to person, place, and time. She appears well-developed. No distress.  obese  HENT:  Head: Normocephalic and atraumatic.  Right Ear: External ear normal.  Left Ear: External ear normal.  Mouth/Throat: Oropharynx is clear and moist. No oropharyngeal exudate.  Eyes: Pupils are equal, round, and reactive to light. Conjunctivae and EOM are normal. No scleral icterus.  Neck: Neck  supple.  Cardiovascular: Normal rate, regular rhythm, normal heart sounds and intact distal pulses.   No murmur heard. Distant heart sounds due to habitus  Pulmonary/Chest: Effort normal and breath sounds normal. No respiratory distress. She has no wheezes. She has no rales.  Abdominal: Soft. Bowel sounds are normal. She exhibits no distension. There is no tenderness. There is no rebound and no guarding.  Musculoskeletal: She exhibits no edema.  R shoulder: Patient without full ROM due to pain.  Will not fully participate in strength testing due to pain with all movements. No bony deformities or TTP over bony landmarks  Lymphadenopathy:    She has no cervical adenopathy.  Neurological: She is alert and oriented to person, place, and time.  Skin: Skin is warm and dry. She is not diaphoretic.  Chronic venous stasis changes to b/l legs  Psychiatric: She has a normal mood and affect. Her behavior is normal.  Vitals reviewed.    Depression Screen PHQ 2/9 Scores 12/13/2016  PHQ - 2 Score 4  PHQ- 9 Score 7    Assessment & Plan:     Routine Health Maintenance and Physical Exam  Exercise Activities and Dietary recommendations Goals    None      Immunization History  Administered Date(s) Administered  . Influenza,inj,Quad PF,36+ Mos 09/06/2015, 12/13/2016  . Pneumococcal Polysaccharide-23 12/13/2016    Health Maintenance  Topic Date Due  . Hepatitis C Screening  29-Sep-1951  . HIV Screening  09/20/1968  . TETANUS/TDAP  09/20/1972  . PAP SMEAR  09/20/1974  . MAMMOGRAM  09/21/2003  . COLONOSCOPY  09/21/2003  . OPHTHALMOLOGY EXAM  01/04/2017  . HEMOGLOBIN A1C  06/12/2017  . INFLUENZA VACCINE  07/03/2017  . FOOT EXAM  12/14/2017  . PNEUMOCOCCAL POLYSACCHARIDE VACCINE (2) 12/13/2021    Discussed health benefits of physical activity, and encouraged her to engage in regular exercise appropriate for her age and condition.   HTN (hypertension) Uncontrolled, but states she is out  of all medications Resume Coreg, losartan, lasix at urrent doses Patient states she will be seeing Cardiology for CHF Check CMP F/u in 1 month  Chronic systolic heart failure ([place]) Stable without signs of volume overload currently Echo from 09/2015 reviewed Patient with upcoming Cardiology appt Check CMP Continue Lasix  Resume losartan and Coreg  Diabetes mellitus, type 2 Recheck A1c Resume glipizide F/u in 1 month to discuss control and screenings  Rheumatoid arthritis [facility]) Patient followed by Rheumatology Discussed that opioids are not appropriate for long-term management of chronic pain We will start gabapentin 100mg  TID We will continue Percocet TID prn with plans to taper in the future Referral to Pain management F/u in 1 month  Right shoulder pain Grossly pain with any ROM acitivites Referral to Ortho Doubt it is actually related to vaccination prior to initiation  Pure hypercholesterolemia Recheck lipid panel  Post-menopausal bleeding Consider for endometrial hyperplasia Referral to Gyn Patient too uncomfortable for laying back for pap smear today As she will likely need pelvic exam for evaluation anyways, will defer to gyn  Health care maintenance Ordered mammogram, colonoscopy Refuses vaccinations at this time Screening hiv and hep c    --------------------------------------------------------------------  The entirety of the information documented in the History of Present Illness, Review of Systems and Physical Exam were personally obtained by me. Portions of this information were initially documented by Raquel Sarna Ratchford, CMA and reviewed by me for thoroughness and accuracy.    Lavon Paganini, MD  [facility]

## 2017-07-18 NOTE — Assessment & Plan Note (Signed)
Stable without signs of volume overload currently Echo from 09/2015 reviewed Patient with upcoming Cardiology appt Check CMP Continue Lasix Resume losartan and Coreg

## 2017-07-18 NOTE — Assessment & Plan Note (Signed)
Grossly pain with any ROM acitivites Referral to Ortho Doubt it is actually related to vaccination prior to initiation

## 2017-07-18 NOTE — Assessment & Plan Note (Addendum)
Uncontrolled, but states she is out of all medications Resume Coreg, losartan, lasix at urrent doses Patient states she will be seeing Cardiology for CHF Check CMP F/u in 1 month

## 2017-07-18 NOTE — Assessment & Plan Note (Signed)
Recheck lipid panel 

## 2017-07-18 NOTE — Patient Instructions (Signed)
Things to do to keep yourself healthy  °- Exercise at least 30-45 minutes a day, 3-4 days a week.  °- Eat a low-fat diet with lots of fruits and vegetables, up to 7-9 servings per day.  °- Seatbelts can save your life. Wear them always.  °- Smoke detectors on every level of your home, check batteries every year.  °- Eye Doctor - have an eye exam every 1-2 years  °- Safe sex - if you may be exposed to STDs, use a condom.  °- Alcohol -  If you drink, do it moderately, less than 2 drinks per day.  °- Health Care Power of Attorney. Choose someone to speak for you if you are not able.  °- Depression is common in our stressful world.If you're feeling down or losing interest in things you normally enjoy, please come in for a visit.  °- Violence - If anyone is threatening or hurting you, please call immediately. ° ° °

## 2017-07-18 NOTE — Assessment & Plan Note (Signed)
Recheck A1c Resume glipizide F/u in 1 month to discuss control and screenings

## 2017-07-18 NOTE — Assessment & Plan Note (Addendum)
Patient followed by Rheumatology Discussed that opioids are not appropriate for long-term management of chronic pain We will start gabapentin 100mg  TID We will continue Percocet TID prn with plans to taper in the future Referral to Pain management F/u in 1 month

## 2017-07-18 NOTE — Assessment & Plan Note (Signed)
Ordered mammogram, colonoscopy Refuses vaccinations at this time Screening hiv and hep c

## 2017-07-18 NOTE — Assessment & Plan Note (Signed)
Consider for endometrial hyperplasia Referral to Gyn Patient too uncomfortable for laying back for pap smear today As she will likely need pelvic exam for evaluation anyways, will defer to gyn

## 2017-07-19 LAB — CBC
Hematocrit: 37.4 % (ref 34.0–46.6)
Hemoglobin: 12.3 g/dL (ref 11.1–15.9)
MCH: 26.3 pg — ABNORMAL LOW (ref 26.6–33.0)
MCHC: 32.9 g/dL (ref 31.5–35.7)
MCV: 80 fL (ref 79–97)
Platelets: 295 10*3/uL (ref 150–379)
RBC: 4.67 x10E6/uL (ref 3.77–5.28)
RDW: 15.4 % (ref 12.3–15.4)
WBC: 7.2 10*3/uL (ref 3.4–10.8)

## 2017-07-19 LAB — COMPREHENSIVE METABOLIC PANEL
ALT: 13 IU/L (ref 0–32)
AST: 18 IU/L (ref 0–40)
Albumin/Globulin Ratio: 1.1 — ABNORMAL LOW (ref 1.2–2.2)
Albumin: 4 g/dL (ref 3.6–4.8)
Alkaline Phosphatase: 75 IU/L (ref 39–117)
BUN/Creatinine Ratio: 16 (ref 12–28)
BUN: 10 mg/dL (ref 8–27)
Bilirubin Total: 0.4 mg/dL (ref 0.0–1.2)
CO2: 25 mmol/L (ref 20–29)
Calcium: 9.4 mg/dL (ref 8.7–10.3)
Chloride: 97 mmol/L (ref 96–106)
Creatinine, Ser: 0.61 mg/dL (ref 0.57–1.00)
GFR calc Af Amer: 112 mL/min/{1.73_m2} (ref >59)
GFR calc non Af Amer: 97 mL/min/{1.73_m2} (ref >59)
Globulin, Total: 3.7 g/dL (ref 1.5–4.5)
Glucose: 146 mg/dL — ABNORMAL HIGH (ref 65–99)
Potassium: 4.6 mmol/L (ref 3.5–5.2)
Sodium: 138 mmol/L (ref 134–144)
Total Protein: 7.7 g/dL (ref 6.0–8.5)

## 2017-07-19 LAB — HEMOGLOBIN A1C
Est. average glucose Bld gHb Est-mCnc: 194 mg/dL
Hgb A1c MFr Bld: 8.4 % — ABNORMAL HIGH (ref 4.8–5.6)

## 2017-07-19 LAB — HEPATITIS C ANTIBODY: Hep C Virus Ab: <0.1 s/co ratio (ref 0.0–0.9)

## 2017-07-19 LAB — HIV ANTIBODY (ROUTINE TESTING W REFLEX): HIV Screen 4th Generation wRfx: NONREACTIVE

## 2017-07-26 ENCOUNTER — Other Ambulatory Visit: Payer: Self-pay | Admitting: Obstetrics and Gynecology

## 2017-07-26 DIAGNOSIS — N950 Postmenopausal bleeding: Secondary | ICD-10-CM

## 2017-07-31 ENCOUNTER — Ambulatory Visit (INDEPENDENT_AMBULATORY_CARE_PROVIDER_SITE_OTHER): Payer: BLUE CROSS/BLUE SHIELD

## 2017-07-31 ENCOUNTER — Encounter: Payer: Self-pay | Admitting: Obstetrics and Gynecology

## 2017-07-31 ENCOUNTER — Ambulatory Visit: Payer: BLUE CROSS/BLUE SHIELD | Admitting: Obstetrics and Gynecology

## 2017-07-31 ENCOUNTER — Ambulatory Visit (INDEPENDENT_AMBULATORY_CARE_PROVIDER_SITE_OTHER): Payer: BLUE CROSS/BLUE SHIELD | Admitting: Obstetrics and Gynecology

## 2017-07-31 VITALS — BP 156/98 | Ht 68.0 in | Wt 338.0 lb

## 2017-07-31 DIAGNOSIS — N950 Postmenopausal bleeding: Secondary | ICD-10-CM | POA: Diagnosis not present

## 2017-07-31 DIAGNOSIS — Z124 Encounter for screening for malignant neoplasm of cervix: Secondary | ICD-10-CM

## 2017-07-31 DIAGNOSIS — Z1151 Encounter for screening for human papillomavirus (HPV): Secondary | ICD-10-CM

## 2017-07-31 DIAGNOSIS — Z8041 Family history of malignant neoplasm of ovary: Secondary | ICD-10-CM

## 2017-07-31 DIAGNOSIS — Z803 Family history of malignant neoplasm of breast: Secondary | ICD-10-CM

## 2017-07-31 DIAGNOSIS — Z1379 Encounter for other screening for genetic and chromosomal anomalies: Secondary | ICD-10-CM

## 2017-07-31 DIAGNOSIS — N3000 Acute cystitis without hematuria: Secondary | ICD-10-CM

## 2017-07-31 LAB — POCT URINALYSIS DIPSTICK
Bilirubin, UA: NEGATIVE
Glucose, UA: NEGATIVE
Ketones, UA: NEGATIVE
Nitrite, UA: NEGATIVE
Spec Grav, UA: 1.015 (ref 1.010–1.025)
pH, UA: 6 (ref 5.0–8.0)

## 2017-07-31 MED ORDER — NITROFURANTOIN MONOHYD MACRO 100 MG PO CAPS: 100.0000 mg | ORAL_CAPSULE | Freq: Two times a day (BID) | ORAL | 0 refills | Status: AC

## 2017-07-31 NOTE — Progress Notes (Signed)
Chief Complaint  Patient presents with  . Follow-up  . Pelvic Pain    HPI:      [name] [name] is a 66 y.o. G2P1 who LMP was No LMP recorded (lmp unknown). Patient is postmenopausal., presents today for NP eval of postmenopausal bleeding, referred by PCP Brita Romp, Dionne Bucy, MD). She has had occas (every 2 months) light postmenopausal bleeding for the past couple yrs that would last a day or so, but it has become daily for the past 2 months. She has been postmenopausal for over 10 yrs. Flow is medium, changing pads Q2-3 hrs and with nickel to 1/2 dollar sized clots. She has also noted intermittent sharp and crampy pelvic pain for the past 2 months.   She has noticed urinary frequency/urgency/hourly nocturia/dysuria/incontinence for the past wk. No vag sx.   She has a strong FH of breast, ovar and uterine cancer.   She has not had a pap smear in 35 yrs. No hx of abn in past that pt remembers.   She has a hx of colon polyps and is being referred to GI. She was noted to have breast mass with PCP and is sched for mammo.     Past Medical History:  Diagnosis Date  . Arthritis    Rhumetoid arthritis  . Cellulitis of both lower extremities    Chronic  . CHF (congestive heart failure) ([place])   . Coronary artery disease   . Diabetes mellitus without complication ([place])   . Fibromyalgia   . Fibromyalgia affecting shoulder region   . Hyperlipidemia   . Hypertension   . Spinal stenosis     Past Surgical History:  Procedure Laterality Date  . ABLATION SAPHENOUS VEIN W/ RFA    . ANAL FISSURECTOMY  01/2004  . HERNIA REPAIR    . MEDIAL PARTIAL KNEE REPLACEMENT  01/06/2014  . UMBILICAL HERNIA REPAIR N/A 09/05/2015   Procedure: HERNIA REPAIR incarcerated UMBILICAL ADULT;  Surgeon: Sherri Rad, MD;  Location: [facility] ORS;  Service: General;  Laterality: N/A;  . Vascular Stent  11/04/2013    Family History  Problem Relation Age of Onset  . Hypertension Mother   . Cataracts Mother   .  Thyroid disease Mother   . Dementia Mother   . COPD Father   . Dementia Father   . Cataracts Father   . Aneurysm Father        Abdominal & Brain  . Diabetes Father   . Melanoma Father   . Breast cancer Sister 101  . Fibromyalgia Sister   . Migraines Sister   . Hypertension Sister   . Breast cancer Sister 12  . COPD Sister   . Ovarian cancer Sister 69  . Migraines Brother   . Heart attack Brother   . Colon cancer Paternal Uncle        36s  . Colon cancer Paternal Uncle        76s  . Uterine cancer Cousin   . Uterine cancer Cousin     Social History   Social History  . Marital status: Married    Spouse name: Shanon Brow  . Number of children: 1  . Years of education: 9th Grade   Occupational History  . Disabled    Social History Main Topics  . Smoking status: Former Smoker    Quit date: 12/03/1996  . Smokeless tobacco: Never Used  . Alcohol use No  . Drug use: No  . Sexual activity: Not Currently  Other Topics Concern  . Not on file   Social History Narrative  . No narrative on file     Current Outpatient Prescriptions:  .  aspirin 81 MG tablet, Take 1 tablet (81 mg total) by mouth daily., Disp: 30 tablet, Rfl: 2 .  carvedilol (COREG) 3.125 MG tablet, Take 1 tablet (3.125 mg total) by mouth 2 (two) times daily with a meal., Disp: 60 tablet, Rfl: 2 .  cetirizine (ZYRTEC) 10 MG tablet, TAKE 1 TABLET (10 MG TOTAL) BY MOUTH DAILY. (NOT COVER*) (Patient not taking: Reported on 07/18/2017), Disp: 30 tablet, Rfl: 2 .  citalopram (CELEXA) 40 MG tablet, Take 40 mg by mouth daily., Disp: , Rfl: 3 .  furosemide (LASIX) 20 MG tablet, Take 1 tablet (20 mg total) by mouth daily., Disp: 30 tablet, Rfl: 1 .  gabapentin (NEURONTIN) 100 MG capsule, Take 1 capsule (100 mg total) by mouth 3 (three) times daily., Disp: 90 capsule, Rfl: 3 .  glipiZIDE (GLUCOTROL) 5 MG tablet, Take 1 tablet (5 mg total) by mouth 2 (two) times daily before a meal., Disp: 60 tablet, Rfl: 2 .  glucose blood  (ACCU-CHEK AVIVA PLUS) test strip, To check blood sugar once a day.  DX: E11.9 (Patient not taking: Reported on 07/18/2017), Disp: 100 each, Rfl: 1 .  losartan (COZAAR) 50 MG tablet, Take 1 tablet (50 mg total) by mouth daily., Disp: 30 tablet, Rfl: 1 .  meloxicam (MOBIC) 15 MG tablet, Take 1 tablet (15 mg total) by mouth daily. (Patient not taking: Reported on 07/18/2017), Disp: 30 tablet, Rfl: 2 .  nitrofurantoin, macrocrystal-monohydrate, (MACROBID) 100 MG capsule, Take 1 capsule (100 mg total) by mouth 2 (two) times daily., Disp: 14 capsule, Rfl: 0 .  oxyCODONE-acetaminophen (PERCOCET) 10-325 MG tablet, Take 1 tablet by mouth every 8 (eight) hours as needed., Disp: 90 tablet, Rfl: 0   ROS:  Review of Systems  Constitutional: Positive for fatigue. Negative for fever and unexpected weight change.  Respiratory: Negative for cough, shortness of breath and wheezing.   Cardiovascular: Negative for chest pain, palpitations and leg swelling.  Gastrointestinal: Positive for constipation and diarrhea. Negative for blood in stool, nausea and vomiting.  Endocrine: Negative for cold intolerance, heat intolerance and polyuria.  Genitourinary: Positive for dysuria, frequency, pelvic pain, urgency and vaginal bleeding. Negative for dyspareunia, flank pain, genital sores, hematuria, menstrual problem, vaginal discharge and vaginal pain.  Musculoskeletal: Positive for arthralgias and joint swelling. Negative for back pain and myalgias.  Skin: Negative for rash.  Neurological: Positive for dizziness and headaches. Negative for syncope, light-headedness and numbness.  Hematological: Negative for adenopathy. Bruises/bleeds easily.  Psychiatric/Behavioral: Positive for dysphoric mood. Negative for agitation, confusion, sleep disturbance and suicidal ideas. The patient is not nervous/anxious.      OBJECTIVE:   Vitals:  BP (!) 156/98   Ht 5\' 8"  (1.727 m)   Wt (!) 338 lb (153.3 kg)   LMP  (LMP Unknown)   BMI  51.39 kg/m   Physical Exam  Constitutional: She is oriented to person, place, and time and well-developed, well-nourished, and in no distress. Vital signs are normal.  Pulmonary/Chest: Effort normal.  Abdominal: Soft.  Genitourinary: Uterus normal, cervix normal, right adnexa normal, left adnexa normal and vulva normal. Uterus is not enlarged. Cervix exhibits no motion tenderness and no tenderness. Right adnexum displays no mass and no tenderness. Left adnexum displays no mass and no tenderness. Vulva exhibits no erythema, no exudate, no lesion, no rash and no tenderness. Vagina exhibits  no lesion. Bloody and vaginal discharge found.  Musculoskeletal: Normal range of motion.  Neurological: She is alert and oriented to person, place, and time.  Skin: Skin is warm.  Psychiatric: Memory, affect and judgment normal.  Vitals reviewed.   Results: Results for orders placed or performed in visit on 07/31/17 (from the past 24 hour(s))  POCT Urinalysis Dipstick     Status: Abnormal   Collection Time: 07/31/17 11:17 AM  Result Value Ref Range   Color, UA yellow    Clarity, UA     Glucose, UA neg    Bilirubin, UA neg    Ketones, UA neg    Spec Grav, UA 1.015 1.010 - 1.025   Blood, UA small    pH, UA 6.0 5.0 - 8.0   Protein, UA trace    Urobilinogen, UA  0.2 or 1.0 E.U./dL   Nitrite, UA neg    Leukocytes, UA Moderate (2+) (A) Negative   GYN U/S-->EM=9.94 MM; NO FF IN CDS; BILAT OVAR NOT SEEN; NORMAL UTERUS  Assessment/Plan: Postmenopausal bleeding - For 2 yrs, daily now. GYN u/s with thickened EM. EMB today to rule out hyperplasia/carcinoma. Chck pap. Will call pt with results and dispo with MD.  - Plan: Pathology  Acute cystitis without hematuria - Rx macrobid. Check C&S. F/u prn.  - Plan: POCT Urinalysis Dipstick, nitrofurantoin, macrocrystal-monohydrate, (MACROBID) 100 MG capsule, Urine Culture  Family history of breast cancer - Plan: Integrated BRACAnalysis  Family history of ovarian  cancer - Plan: Integrated BRACAnalysis  Genetic testing of female - MyRisk testing discussed and done today. Handout given. Will call pt with results.  - Plan: Integrated BRACAnalysis  Cervical cancer screening - Plan: IGP, Aptima HPV  Screening for HPV (human papillomavirus) - Plan: IGP, Aptima HPV    Meds ordered this encounter  Medications  . nitrofurantoin, macrocrystal-monohydrate, (MACROBID) 100 MG capsule    Sig: Take 1 capsule (100 mg total) by mouth 2 (two) times daily.    Dispense:  14 capsule    Refill:  0      Return if symptoms worsen or fail to improve.  Ruford Dudzinski B. Kinzie Wickes, PA-C 07/31/2017 11:23 AM     Endometrial Biopsy After discussion with the patient regarding her abnormal uterine bleeding I recommended that she proceed with an endometrial biopsy for further diagnosis. The risks, benefits, alternatives, and indications for an endometrial biopsy were discussed with the patient in detail. She understood the risks including infection, bleeding, cervical laceration and uterine perforation.  Verbal consent was obtained.   PROCEDURE NOTE:  Pipelle endometrial biopsy was performed using aseptic technique with iodine preparation.  The uterus was sounded to a length of 8.0 cm.  Adequate sampling was obtained with minimal blood loss.  The patient tolerated the procedure well.  Disposition will be pending pathology.  Rolando Hessling B. Tatum Corl, PA-C [employer] 07/31/2017  11:23 AM

## 2017-08-02 LAB — PATHOLOGY

## 2017-08-02 LAB — URINE CULTURE

## 2017-08-03 LAB — IGP, APTIMA HPV
HPV Aptima: NEGATIVE
PAP Smear Comment: 0

## 2017-08-05 NOTE — Progress Notes (Signed)
I would lean towards hysterectomy  ABX appropriate for UTI Plan to see soon

## 2017-08-06 ENCOUNTER — Telehealth: Payer: Self-pay | Admitting: Obstetrics and Gynecology

## 2017-08-06 NOTE — Telephone Encounter (Signed)
Pt aware of neg pap, UTI on culture (on abx already) and hyperplasia with atypia on EMB, with possible EM polyp. Recommended hysterectomy per Dr. Kenton Kingfisher. Consultation appt sched. Pt's bleeding is off and on.

## 2017-08-12 ENCOUNTER — Encounter: Payer: Self-pay | Admitting: Obstetrics and Gynecology

## 2017-08-13 ENCOUNTER — Ambulatory Visit
Payer: BLUE CROSS/BLUE SHIELD | Attending: Student in an Organized Health Care Education/Training Program | Admitting: Student in an Organized Health Care Education/Training Program

## 2017-08-13 ENCOUNTER — Encounter: Payer: Self-pay | Admitting: Student in an Organized Health Care Education/Training Program

## 2017-08-13 ENCOUNTER — Telehealth: Payer: Self-pay | Admitting: Obstetrics and Gynecology

## 2017-08-13 VITALS — BP 131/71 | HR 83 | Temp 98.1°F | Resp 18 | Ht 68.0 in | Wt 338.0 lb

## 2017-08-13 DIAGNOSIS — J449 Chronic obstructive pulmonary disease, unspecified: Secondary | ICD-10-CM | POA: Diagnosis not present

## 2017-08-13 DIAGNOSIS — Z6841 Body Mass Index (BMI) 40.0 and over, adult: Secondary | ICD-10-CM | POA: Diagnosis not present

## 2017-08-13 DIAGNOSIS — M5441 Lumbago with sciatica, right side: Secondary | ICD-10-CM | POA: Insufficient documentation

## 2017-08-13 DIAGNOSIS — F329 Major depressive disorder, single episode, unspecified: Secondary | ICD-10-CM | POA: Insufficient documentation

## 2017-08-13 DIAGNOSIS — E7800 Pure hypercholesterolemia, unspecified: Secondary | ICD-10-CM | POA: Diagnosis not present

## 2017-08-13 DIAGNOSIS — I110 Hypertensive heart disease with heart failure: Secondary | ICD-10-CM | POA: Diagnosis not present

## 2017-08-13 DIAGNOSIS — M5442 Lumbago with sciatica, left side: Secondary | ICD-10-CM | POA: Insufficient documentation

## 2017-08-13 DIAGNOSIS — Z7982 Long term (current) use of aspirin: Secondary | ICD-10-CM | POA: Diagnosis not present

## 2017-08-13 DIAGNOSIS — I5022 Chronic systolic (congestive) heart failure: Secondary | ICD-10-CM | POA: Diagnosis not present

## 2017-08-13 DIAGNOSIS — N950 Postmenopausal bleeding: Secondary | ICD-10-CM | POA: Insufficient documentation

## 2017-08-13 DIAGNOSIS — G8929 Other chronic pain: Secondary | ICD-10-CM | POA: Diagnosis not present

## 2017-08-13 DIAGNOSIS — Z87891 Personal history of nicotine dependence: Secondary | ICD-10-CM | POA: Insufficient documentation

## 2017-08-13 DIAGNOSIS — M25511 Pain in right shoulder: Secondary | ICD-10-CM | POA: Insufficient documentation

## 2017-08-13 DIAGNOSIS — M47816 Spondylosis without myelopathy or radiculopathy, lumbar region: Secondary | ICD-10-CM

## 2017-08-13 DIAGNOSIS — Z955 Presence of coronary angioplasty implant and graft: Secondary | ICD-10-CM | POA: Insufficient documentation

## 2017-08-13 DIAGNOSIS — Z79899 Other long term (current) drug therapy: Secondary | ICD-10-CM | POA: Insufficient documentation

## 2017-08-13 DIAGNOSIS — M4696 Unspecified inflammatory spondylopathy, lumbar region: Secondary | ICD-10-CM | POA: Diagnosis not present

## 2017-08-13 DIAGNOSIS — M797 Fibromyalgia: Secondary | ICD-10-CM | POA: Diagnosis not present

## 2017-08-13 DIAGNOSIS — I872 Venous insufficiency (chronic) (peripheral): Secondary | ICD-10-CM | POA: Diagnosis not present

## 2017-08-13 DIAGNOSIS — Z7984 Long term (current) use of oral hypoglycemic drugs: Secondary | ICD-10-CM | POA: Diagnosis not present

## 2017-08-13 DIAGNOSIS — I2510 Atherosclerotic heart disease of native coronary artery without angina pectoris: Secondary | ICD-10-CM | POA: Insufficient documentation

## 2017-08-13 DIAGNOSIS — I252 Old myocardial infarction: Secondary | ICD-10-CM | POA: Diagnosis not present

## 2017-08-13 DIAGNOSIS — M5136 Other intervertebral disc degeneration, lumbar region: Secondary | ICD-10-CM | POA: Insufficient documentation

## 2017-08-13 DIAGNOSIS — G894 Chronic pain syndrome: Secondary | ICD-10-CM | POA: Insufficient documentation

## 2017-08-13 DIAGNOSIS — M069 Rheumatoid arthritis, unspecified: Secondary | ICD-10-CM | POA: Insufficient documentation

## 2017-08-13 DIAGNOSIS — M48062 Spinal stenosis, lumbar region with neurogenic claudication: Secondary | ICD-10-CM | POA: Diagnosis not present

## 2017-08-13 DIAGNOSIS — E119 Type 2 diabetes mellitus without complications: Secondary | ICD-10-CM | POA: Diagnosis not present

## 2017-08-13 MED ORDER — TIZANIDINE HCL 4 MG PO TABS: 4.0000 mg | ORAL_TABLET | Freq: Two times a day (BID) | ORAL | 1 refills | Status: AC | PRN

## 2017-08-13 MED ORDER — GABAPENTIN 300 MG PO CAPS: 300.0000 mg | ORAL_CAPSULE | Freq: Three times a day (TID) | ORAL | 2 refills | Status: DC

## 2017-08-13 NOTE — Progress Notes (Signed)
Safety precautions to be maintained throughout the outpatient stay will include: orient to surroundings, keep bed in low position, maintain call bell within reach at all times, provide assistance with transfer out of bed and ambulation.  

## 2017-08-13 NOTE — Telephone Encounter (Signed)
Pt aware of neg MyRisk results. IBIS=10%/riskscore=17%. Pt aware neg results are not indicative of her potential to develop cancer. She will cont yearly CBE and mammos (needs to sched). Hard copy left for pt to pick up at appt with Dr. Kenton Kingfisher 08/15/17. Questions answered.

## 2017-08-13 NOTE — Progress Notes (Signed)
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 region, with neurogenic claudication   3. Lumbar degenerative disc disease   4. Lumbar facet arthropathy ([place])   5. Chronic bilateral low back pain with bilateral sciatica   6. Chronic venous insufficiency   7. Type 2 diabetes mellitus without complication, without long-term current use of insulin ([place])   8. Chronic pain syndrome    Plan of Care (Initial workup plan)  Note: Please be advised that as per protocol, today's visit has been an evaluation only. We have not taken over the patient's controlled substance management.  General Recommendations: The pain condition that the patient suffers from is best treated with a multidisciplinary approach that involves an increase in physical activity to prevent de-conditioning and worsening of the pain cycle, as well as psychological counseling (formal and/or informal) to address the co-morbid psychological affects of pain. Treatment will often involve judicious use of pain medications and interventional procedures to decrease the pain, allowing the  patient to participate in the physical activity that will ultimately produce long-lasting pain reductions. The goal of the multidisciplinary approach is to return the patient to a higher level of overall function and to restore their ability to perform activities of daily living.  66 year old female with a history of morbid obesity, depression, type 2 diabetes, GI ulcer, hypertension who presents with diffuse body pain including arthralgias of multiple joints secondary to fibromyalgia and low back pain secondary to lumbar degenerative disc disease, lumbar spinal stenosis, and bilateral knee pain secondary to knee osteoarthritis and morbid obesity. Patient has been on chronic opioid therapy for more than 10 years. She is referred from her primary care physician for medication management including continuation of opioid therapy.  Plan: 1.We will obtain a urine toxicology (drug) sample, as is customary with NEW patients or patients who are on controlled substances management and require periodic testing. We will surely send the sample for opioid testing and confirmation and may also do so for benzidiazepines and other substances 2. Referral to pain psychology for appropriateness of chronic opioid therapy. Patient to be seen in clinic for medication management after she has seen pain psychology 3. Increase gabapentin to 300 mg 3 times daily. Prescription provided 4. Tizanidine 4 mg twice a day when necessary muscle spasms. Prescription provided 5. Referral to physical therapy; recommend patient start with aquatic therapy first as patient does have pool at her house. Aquatic therapy has been beneficial for fibromyalgia patients. 6. No recent imaging in computer, we'll obtain lumbar, thoracic, hip, SI joint, knee x-rays   Ordered Lab-work, Procedure(s), Referral(s), & Consult(s): Orders Placed This Encounter  Procedures  . DG Lumbar Spine Complete W/Bend  . DG Si Joints  . DG HIP UNILAT W OR W/O PELVIS  2-3 VIEWS LEFT  . DG HIP UNILAT W OR W/O PELVIS 2-3 VIEWS RIGHT  . DG Knee 1-2 Views Left  . DG Knee 1-2 Views Right  . Compliance Drug Analysis, Ur  . Ambulatory referral to Psychology  . Ambulatory referral to Physical Therapy   Pharmacotherapy (current): Medications ordered:  Meds ordered this encounter  Medications  . tiZANidine (ZANAFLEX) 4 MG tablet    Sig: Take 1 tablet (4 mg total) by mouth 2 (two) times daily as needed for muscle spasms.    Dispense:  60 tablet    Refill:  1  . gabapentin (NEURONTIN) 300 MG capsule    Sig: Take 1 capsule (300 mg total) by mouth 3 (three) times daily.    Dispense:  90 capsule    Refill:  2   Medications administered during this visit: Ms. Stull had no medications administered during this visit.   Pharmacological management options:  Opioid Analgesics: The patient was informed that there is no guarantee that she would be a candidate for opioid analgesics. The decision will be made following CDC guidelines. This decision will be based on the results of diagnostic studies, as well as Ms. Lauricella risk profile. Pending pain psych results. Patient does have morbid obesity and symptoms of obstructive sleep apnea. Increases her risk of respiratory depression from chronic  opioid therapy.  She was informed of this.  Membrane stabilizer: Trial of gabapentin, increase to 300 mg 3 times a day. If ineffective can trial Lyrica or Topamax  Muscle relaxant: Trial of tizanidine. Can consider baclofen, Robaxin  NSAID: History of GI ulcers so we will try to avoid  Other analgesic(s): 5% lidocaine, Voltaren gel, Cymbalta    Interventional management options: Ms. Wollen was informed that there is no guarantee that she would be a candidate for interventional therapies. The decision will be based on the results of diagnostic studies, as well as Ms. Correira risk profile.  Procedure(s) under consideration:  -lumbar facet blocks -lumbar SI joint injection -TPIs    Provider-requested follow-up: Return in about 4 weeks (around 09/10/2017) for After Psychological evaluation.  Future Appointments Date Time Provider Department Center  08/15/2017 8:20 AM Gae Dry, MD [facility] None  08/15/2017 11:00 AM Virginia Crews, MD [facility] None  08/28/2017 1:30 PM Lin Landsman, MD [facility] None  09/10/2017 11:30 AM Gillis Santa, MD [facility] None    Primary Care Physician: Virginia Crews, MD Location: [facility] Facility Note by: Gillis Santa, M.D, Date: 08/13/2017; Time: 2:21 PM  Patient Instructions  1. Urine drug screen today. 2. Pain psych referral for chronic opioid therapy at this clinic. 3. Increase gabapentin to 300 mg 3 times a day, prescription provided next line  4. Start tizanidine 4 mg twice daily as needed for muscle spasms, prescription provided 5. Lumbar, thoracic, hip, SI joint, bilateral knee x-rays, 6. Follow-up in 4 weeks or earlier pending pain psych evaluation

## 2017-08-13 NOTE — Patient Instructions (Signed)
1. Urine drug screen today. 2. Pain psych referral for chronic opioid therapy at this clinic. 3. Increase gabapentin to 300 mg 3 times a day, prescription provided next line  4. Start tizanidine 4 mg twice daily as needed for muscle spasms, prescription provided 5. Lumbar, thoracic, hip, SI joint, bilateral knee x-rays, 6. Follow-up in 4 weeks or earlier pending pain psych evaluation

## 2017-08-15 ENCOUNTER — Encounter: Payer: Self-pay | Admitting: Obstetrics & Gynecology

## 2017-08-15 ENCOUNTER — Encounter: Payer: Self-pay | Admitting: Family Medicine

## 2017-08-15 ENCOUNTER — Ambulatory Visit (INDEPENDENT_AMBULATORY_CARE_PROVIDER_SITE_OTHER): Payer: BLUE CROSS/BLUE SHIELD | Admitting: Family Medicine

## 2017-08-15 ENCOUNTER — Ambulatory Visit (INDEPENDENT_AMBULATORY_CARE_PROVIDER_SITE_OTHER): Payer: BLUE CROSS/BLUE SHIELD | Admitting: Obstetrics & Gynecology

## 2017-08-15 VITALS — BP 130/80 | HR 101 | Ht 68.0 in | Wt 328.0 lb

## 2017-08-15 VITALS — BP 146/74 | HR 100 | Temp 98.2°F | Resp 22 | Wt 346.0 lb

## 2017-08-15 DIAGNOSIS — I5022 Chronic systolic (congestive) heart failure: Secondary | ICD-10-CM | POA: Diagnosis not present

## 2017-08-15 DIAGNOSIS — G894 Chronic pain syndrome: Secondary | ICD-10-CM | POA: Diagnosis not present

## 2017-08-15 DIAGNOSIS — E119 Type 2 diabetes mellitus without complications: Secondary | ICD-10-CM | POA: Diagnosis not present

## 2017-08-15 DIAGNOSIS — N8502 Endometrial intraepithelial neoplasia [EIN]: Secondary | ICD-10-CM

## 2017-08-15 DIAGNOSIS — M1990 Unspecified osteoarthritis, unspecified site: Secondary | ICD-10-CM

## 2017-08-15 DIAGNOSIS — N840 Polyp of corpus uteri: Secondary | ICD-10-CM

## 2017-08-15 DIAGNOSIS — I10 Essential (primary) hypertension: Secondary | ICD-10-CM | POA: Diagnosis not present

## 2017-08-15 LAB — POCT UA - MICROALBUMIN: Microalbumin Ur, POC: 20 mg/L

## 2017-08-15 MED ORDER — OXYCODONE-ACETAMINOPHEN 10-325 MG PO TABS: 1.0000 | ORAL_TABLET | Freq: Three times a day (TID) | ORAL | 0 refills | Status: DC | PRN

## 2017-08-15 NOTE — Progress Notes (Signed)
Patient: [name] [name] Female    DOB: 11/12/1951   66 y.o.   MRN: [id number] Visit Date: 08/15/2017  Today's Provider: Lavon Paganini, MD   Chief Complaint  Patient presents with  . Diabetes  . Hypertension   Subjective:    HPI      Diabetes Mellitus Type II, Follow-up:   Lab Results  Component Value Date   HGBA1C 8.4 (H) 07/18/2017   HGBA1C 7.4 (H) 12/13/2016   HGBA1C 6.5 01/31/2016    Last seen for diabetes 1 months ago.  Management since then includes resuming medications (pt was off of her DM medications at the time of LOV). She reports good compliance with treatment. Current symptoms include foot ulcerations, paresthesia of the feet, polydipsia and polyuria and have been stable. Home blood sugar records: being checked but pt can't remember the readings  Episodes of hypoglycemia? "not recently" Glucometer is broken - getting a new glucometer - then will start checking BG   Current Insulin Regimen: N/A Most Recent Eye Exam: February 2016 Prior visit with dietician: no Current diet: not eating fried foods, avoiding breads (aggravates RA), avoids processed foods, rarely eats beef, only drinking water and 2 cups of coffee with Stevia sweetener Current exercise: none  Pertinent Labs:    Component Value Date/Time   CHOL 272 (H) 12/13/2016 1056   TRIG 254.0 (H) 12/13/2016 1056   HDL 44.80 12/13/2016 1056   CREATININE 0.61 07/18/2017 1149   CREATININE 0.63 08/31/2014 0533    Wt Readings from Last 3 Encounters:  08/15/17 (!) 346 lb (156.9 kg)  08/15/17 (!) 328 lb (148.8 kg)  08/13/17 (!) 338 lb (153.3 kg)    ------------------------------------------------------------------------  Hypertension, follow-up, CHF:  BP Readings from Last 3 Encounters:  08/15/17 (!) 146/74  08/15/17 130/80  08/13/17 131/71    She was last seen for hypertension 1 months ago.  BP at that visit was 160/102. Management since that visit includes restarting medications that  pt had run out of prior to [place]. She reports poor compliance with treatment. She is not taking Losartan or Carvedilol or Lasix (causes fatigue). She is adherent to low salt diet.   Outside blood pressures are "good" per pt 120's-130's/70's. She is experiencing fatigue and lower extremity edema.  Patient denies chest pain, chest pressure/discomfort, claudication, dyspnea, exertional chest pressure/discomfort, irregular heart beat, near-syncope, orthopnea, palpitations and syncope.   Cardiovascular risk factors include diabetes mellitus, family history of premature cardiovascular disease, hypertension and obesity (BMI >= 30 kg/m2).    ------------------------------------------------------------------------  Follow up for Postmenopausal Bleeding  The patient was last seen for this 1 months ago. Changes made at last visit include referring to GYN.  She reports good compliance with treatment. She saw GYN today.  Workup to date: endometrial biopsy and pelvic ultrasound. EMB- COMPLEX HYPERPLASIA WITH ATYPIA INVOLVING AN ENDOMETRIAL POLYP.   Pt states, "I have not been able to hold my urine since the biopsy". ------------------------------------------------------------------------------------    Allergies  Allergen Reactions  . Latex     itching  . Doxycycline Rash  . Sulfa Antibiotics Itching, Rash and Swelling     Current Outpatient Prescriptions:  .  aspirin 81 MG tablet, Take 1 tablet (81 mg total) by mouth daily., Disp: 30 tablet, Rfl: 2 .  citalopram (CELEXA) 40 MG tablet, Take 40 mg by mouth daily., Disp: , Rfl: 3 .  gabapentin (NEURONTIN) 300 MG capsule, Take 1 capsule (300 mg total) by mouth 3 (three) times daily., Disp: 90  capsule, Rfl: 2 .  glipiZIDE (GLUCOTROL) 5 MG tablet, Take 1 tablet (5 mg total) by mouth 2 (two) times daily before a meal., Disp: 60 tablet, Rfl: 2 .  glucose blood (ACCU-CHEK AVIVA PLUS) test strip, To check blood sugar once a day.  DX: E11.9, Disp: 100  each, Rfl: 1 .  oxyCODONE-acetaminophen (PERCOCET) 10-325 MG tablet, Take 1 tablet by mouth every 8 (eight) hours as needed., Disp: 90 tablet, Rfl: 0 .  predniSONE (STERAPRED UNI-PAK 48 TAB) 10 MG (48) TBPK tablet, Take by mouth daily., Disp: , Rfl:  .  tiZANidine (ZANAFLEX) 4 MG tablet, Take 1 tablet (4 mg total) by mouth 2 (two) times daily as needed for muscle spasms., Disp: 60 tablet, Rfl: 1 .  carvedilol (COREG) 3.125 MG tablet, Take 1 tablet (3.125 mg total) by mouth 2 (two) times daily with a meal. (Patient not taking: Reported on 08/13/2017), Disp: 60 tablet, Rfl: 2 .  cetirizine (ZYRTEC) 10 MG tablet, TAKE 1 TABLET (10 MG TOTAL) BY MOUTH DAILY. (NOT COVER*) (Patient not taking: Reported on 07/18/2017), Disp: 30 tablet, Rfl: 2 .  furosemide (LASIX) 20 MG tablet, Take 1 tablet (20 mg total) by mouth daily. (Patient not taking: Reported on 08/13/2017), Disp: 30 tablet, Rfl: 1 .  losartan (COZAAR) 50 MG tablet, Take 1 tablet (50 mg total) by mouth daily. (Patient not taking: Reported on 08/13/2017), Disp: 30 tablet, Rfl: 1  Review of Systems  Constitutional: Positive for activity change and fatigue. Negative for appetite change, chills, diaphoresis, fever and unexpected weight change.  HENT: Positive for congestion. Negative for ear pain, rhinorrhea and sore throat.   Eyes: Negative for visual disturbance.  Respiratory: Negative for choking, chest tightness and shortness of breath.   Cardiovascular: Positive for leg swelling. Negative for chest pain and palpitations.  Gastrointestinal: Negative.   Endocrine: Positive for polydipsia and polyuria. Negative for polyphagia.  Genitourinary: Positive for frequency and urgency. Negative for difficulty urinating and dysuria.  Musculoskeletal: Positive for arthralgias, back pain and myalgias.  Neurological: Positive for headaches. Negative for dizziness, facial asymmetry, weakness, light-headedness and numbness.  Psychiatric/Behavioral: Negative.      Social History  Substance Use Topics  . Smoking status: Former Smoker    Packs/day: 1.00    Years: 29.00    Quit date: 12/03/1996  . Smokeless tobacco: Never Used  . Alcohol use No   Objective:   BP (!) 146/74 (BP Location: Left Wrist, Patient Position: Sitting, Cuff Size: Normal)   Pulse 100   Temp 98.2 F (36.8 C) (Oral)   Resp (!) 22   Wt (!) 346 lb (156.9 kg)   LMP  (LMP Unknown)   BMI 52.61 kg/m  Vitals:   08/15/17 1125  BP: (!) 146/74  Pulse: 100  Resp: (!) 22  Temp: 98.2 F (36.8 C)  TempSrc: Oral  Weight: (!) 346 lb (156.9 kg)     Physical Exam  Constitutional: She is oriented to person, place, and time. She appears well-developed and well-nourished. No distress.  HENT:  Head: Normocephalic and atraumatic.  Right Ear: External ear normal.  Left Ear: External ear normal.  Mouth/Throat: Oropharynx is clear and moist.  Eyes: Conjunctivae are normal. No scleral icterus.  Neck: Neck supple.  Cardiovascular: Normal rate, regular rhythm, normal heart sounds and intact distal pulses.   No murmur heard. Pulmonary/Chest: Effort normal and breath sounds normal. No respiratory distress. She has no wheezes. She has no rales.  Abdominal: Soft. Bowel sounds are normal. She exhibits  no distension. There is no tenderness. There is no rebound and no guarding.  Musculoskeletal: She exhibits edema. She exhibits no deformity.  Lymphadenopathy:    She has no cervical adenopathy.  Neurological: She is oriented to person, place, and time.  Skin: Skin is warm and dry.  + chronic venous stasis changes of b/l LEs  Psychiatric: She has a normal mood and affect. Her behavior is normal.  Vitals reviewed.   Diabetic Foot Exam - Simple   Simple Foot Form Diabetic Foot exam was performed with the following findings:  Yes 08/15/2017 12:03 PM  Visual Inspection Sensation Testing Intact to touch and monofilament testing bilaterally:  Yes Pulse Check Posterior Tibialis and Dorsalis  pulse intact bilaterally:  Yes Comments + chronic venous stasis        Assessment & Plan:      Problem List Items Addressed This Visit      Cardiovascular and Mediastinum   Chronic systolic heart failure (HCC)    Stable, but mildly hypervolemic with lower extremity edema No crackles on lung exam and breathing comfortably Patient is scheduling an upcoming cardiology appointment Recent CMP within normal limits Resume Lasix, losartan, Coreg Given her history of CHF with a reduced ejection fraction, she likely needs cardiology clearance before her upcoming hysterectomy      HTN (hypertension) - Primary    Uncontrolled, but she has not been taking any of her medications Resume Coreg, losartan, lasix at current doses Discussed importance of regularly taking these medications, considering her CHF She is to have a follow-up appointment with cardiology soon Recent CMP within normal limits F/u in 3 months        Endocrine   Diabetes mellitus, type 2 ([place])    Uncontrolled, last A1c 8.4 At that time, however she had not been taking her glipizide for several months We will continue glipizide at current dose Advised on checking blood glucoses Follow-up in 3 months Foot exam completed today Advised to schedule an eye exam Declined flu and Pneumovax today Microalbumin was completed and normal today, but she is supposed to be taking losartan anyways which we will resume      Relevant Orders   POCT UA - Microalbumin (Completed)     Genitourinary   Atypical endometrial hyperplasia    Followed by gynecology who is planning for a laparoscopic hysterectomy early next month - appreciate assistance        Other   Chronic pain disorder    Followed by pain management - appreciate assistance She is supposed to be seeing pain psychology soon to determine her necessity for chronic opioid therapy There are also considering interventional pain management Her gabapentin dose was increased  recently and she is also started taking Zanaflex I will write for a one-month supply of her Percocet until she is fully established with pain management and they take control of her narcotics       Other Visit Diagnoses    Arthritis       Relevant Medications   oxyCODONE-acetaminophen (PERCOCET) 10-325 MG tablet          The entirety of the information documented in the History of Present Illness, Review of Systems and Physical Exam were personally obtained by me. Portions of this information were initially documented by Raquel Sarna Ratchford, CMA and reviewed by me for thoroughness and accuracy.    Lavon Paganini, MD  [facility]

## 2017-08-15 NOTE — Assessment & Plan Note (Signed)
Uncontrolled, but she has not been taking any of her medications Resume Coreg, losartan, lasix at current doses Discussed importance of regularly taking these medications, considering her CHF She is to have a follow-up appointment with cardiology soon Recent CMP within normal limits F/u in 3 months

## 2017-08-15 NOTE — Progress Notes (Signed)
HPI: Postmenopausal Bleeding Patient complains of vaginal bleeding. She has been menopausal for several years. Currently on no HRT. Bleeding is described as flow about like a period and has occurred a few times. Other menopausal symptoms include: none. Workup to date: endometrial biopsy and pelvic ultrasound. EMB- COMPLEX HYPERPLASIA WITH ATYPIA INVOLVING AN ENDOMETRIAL POLYP.  [name]-   Menstrual History: OB History    Gravida Para Term Preterm AB Living   2 1           SAB TAB Ectopic Multiple Live Births                  No LMP recorded (lmp unknown). Patient is postmenopausal.  PMHx: She  has a past medical history of Arthritis; BRCA negative (07/2017); Cellulitis of both lower extremities; CHF (congestive heart failure) ([place]); Coronary artery disease; Diabetes mellitus without complication ([place]); Family history of breast cancer (07/2017); Family history of ovarian cancer; Fibromyalgia; Fibromyalgia affecting shoulder region; Hyperlipidemia; Hypertension; and Spinal stenosis. Also,  has a past surgical history that includes Medial partial knee replacement (01/06/2014); Vascular Stent (11/04/2013); Anal fissurectomy (01/2004); Hernia repair; Umbilical hernia repair (N/A, 09/05/2015); and Ablation saphenous vein w/ RFA., family history includes Aneurysm in her father; Breast cancer (age of onset: 78) in her sister; Breast cancer (age of onset: 51) in her sister; COPD in her father and sister; Cataracts in her father and mother; Colon cancer in her paternal uncle and paternal uncle; Dementia in her father and mother; Diabetes in her father; Fibromyalgia in her sister; Heart attack in her brother; Hypertension in her mother and sister; Melanoma in her father; Migraines in her brother and sister; Ovarian cancer (age of onset: 109) in her sister; Thyroid disease in her mother; Uterine cancer in her cousin and cousin.,  reports that she quit smoking about 20 years ago. She has never used smokeless tobacco.  She reports that she does not drink alcohol or use drugs.  She has a current medication list which includes the following prescription(s): aspirin, carvedilol, cetirizine, citalopram, furosemide, gabapentin, glipizide, glucose blood, losartan, meloxicam, oxycodone-acetaminophen, prednisone, and tizanidine. Also, is allergic to latex; doxycycline; and sulfa antibiotics.  Review of Systems  Constitutional: Negative for chills, fever and malaise/fatigue.  HENT: Negative for congestion, sinus pain and sore throat.   Eyes: Negative for blurred vision and pain.  Respiratory: Negative for cough and wheezing.   Cardiovascular: Negative for chest pain and leg swelling.  Gastrointestinal: Negative for abdominal pain, constipation, diarrhea, heartburn, nausea and vomiting.  Genitourinary: Negative for dysuria, frequency, hematuria and urgency.  Musculoskeletal: Negative for back pain, joint pain, myalgias and neck pain.  Skin: Negative for itching and rash.  Neurological: Negative for dizziness, tremors and weakness.  Endo/Heme/Allergies: Does not bruise/bleed easily.  Psychiatric/Behavioral: Negative for depression. The patient is not nervous/anxious and does not have insomnia.    Objective: BP 130/80   Pulse (!) 101   Ht 5' 8" (1.727 m)   Wt (!) 328 lb (148.8 kg)   LMP  (LMP Unknown)   BMI 49.87 kg/m   Physical examination Constitutional NAD, Conversant  Skin No rashes, lesions or ulceration.   Extremities: Moves all appropriately.  Normal ROM for age. No lymphadenopathy.  Neuro: Grossly intact  Psych: Oriented to PPT.  Normal mood. Normal affect.   Assessment:  Atypical endometrial hyperplasia- Complex Polyp  Post menopausal Bleeding  Options of D&C, Polypectomy, +/-Megace or other therapy for hyperplasia discussed, vs hysterectomy.  Risks of surgery discussed.  Risks of  cancer. Pt prefers hysterectomy.  TLH,BSO; prior hx and risks of adhesions discussed.  Risks of Obesity, Diabetes,  and CHF also discussed related to surgery, anesthesia, and recovery/healing.  I have had a careful discussion with this patient about all the options available and the risk/benefits of each. I have fully informed this patient that a hysterectomy may subject her to a variety of discomforts and risks: She understands that most patients have surgery with little difficulty, but problems can happen ranging from minor to fatal. These include nausea, vomiting, pain, bleeding, infection, poor healing, hernia, or formation of adhesions. Unexpected reactions may occur from any drug or anesthetic given. Unintended injury may occur to other pelvic or abdominal structures such as Fallopian tubes, ovaries, bladder, ureter (tube from kidney to bladder), or bowel. Nerves going from the pelvis to the legs may be injured. Any such injury may require immediate or later additional surgery to correct the problem. Excessive blood loss requiring transfusion is very unlikely but possible. Dangerous blood clots may form in the legs or lungs. Physical and sexual activity will be restricted in varying degrees for an indeterminate period of time but most often 2-4 weeks. She understands that the plan is to do this laparoscopically, however, there is a chance that this will need to be performed via a larger incision. She will most likely be hospitalized overnight. Finally, she understands that it is impossible to list every possible undesirable effect and that the condition for which surgery is done is not always cured or significantly improved, and in rare cases may be even worse. I have also counseled her extensively about the pros and cons of ovarian conservation versus removal. Ample time was given to answer all questions.   Barnett Applebaum, MD, [facility], [facility] 08/15/2017  8:43 AM

## 2017-08-15 NOTE — Patient Instructions (Addendum)
Schedule an eye appointment to look at the back of your eye for the diabetes  Schedule an appointment for: - cardiology for pre-op and CHF (heart failure) follow-up - mammogram   Restart the coreg, losartan, and lasix.

## 2017-08-15 NOTE — Assessment & Plan Note (Addendum)
Followed by pain management - appreciate assistance She is supposed to be seeing pain psychology soon to determine her necessity for chronic opioid therapy There are also considering interventional pain management Her gabapentin dose was increased recently and she is also started taking Zanaflex I will write for a one-month supply of her Percocet until she is fully established with pain management and they take control of her narcotics

## 2017-08-15 NOTE — Assessment & Plan Note (Addendum)
Followed by gynecology who is planning for a laparoscopic hysterectomy early next month - appreciate assistance

## 2017-08-15 NOTE — Patient Instructions (Signed)
Total Laparoscopic Hysterectomy °A total laparoscopic hysterectomy is a minimally invasive surgery to remove your uterus and cervix. This surgery is performed by making several small cuts (incisions) in your abdomen. It can also be done with a thin, lighted tube (laparoscope) inserted into two small incisions in your lower abdomen. Your fallopian tubes and ovaries can be removed (bilateral salpingo-oophorectomy) during this surgery as well. Benefits of minimally invasive surgery include: °· Less pain. °· Less risk of blood loss. °· Less risk of infection. °· Quicker return to normal activities. ° °Tell a health care provider about: °· Any allergies you have. °· All medicines you are taking, including vitamins, herbs, eye drops, creams, and over-the-counter medicines. °· Any problems you or family members have had with anesthetic medicines. °· Any blood disorders you have. °· Any surgeries you have had. °· Any medical conditions you have. °What are the risks? °Generally, this is a safe procedure. However, as with any procedure, complications can occur. Possible complications include: °· Bleeding. °· Blood clots in the legs or lung. °· Infection. °· Injury to surrounding organs. °· Problems with anesthesia. °· Early menopause symptoms (hot flashes, night sweats, insomnia). °· Risk of conversion to an open abdominal incision. ° °What happens before the procedure? °· Ask your health care provider about changing or stopping your regular medicines. °· Do not take aspirin or blood thinners (anticoagulants) for 1 week before the surgery or as told by your health care provider. °· Do not eat or drink anything for 8 hours before the surgery or as told by your health care provider. °· Quit smoking if you smoke. °· Arrange for a ride home after surgery and for someone to help you at home during recovery. °What happens during the procedure? °· You will be given antibiotic medicine. °· An IV tube will be placed in your arm. You  will be given medicine to make you sleep (general anesthetic). °· A gas (carbon dioxide) will be used to inflate your abdomen. This will allow your surgeon to look inside your abdomen, perform your surgery, and treat any other problems found if necessary. °· Three or four small incisions (often less than 1/2 inch) will be made in your abdomen. One of these incisions will be made in the area of your belly button (navel). The laparoscope will be inserted into the incision. Your surgeon will look through the laparoscope while doing your procedure. °· Other surgical instruments will be inserted through the other incisions. °· Your uterus may be removed through your vagina or cut into small pieces and removed through the small incisions. °· Your incisions will be closed. °What happens after the procedure? °· The gas will be released from inside your abdomen. °· You will be taken to the recovery area where a nurse will watch and check your progress. Once you are awake, stable, and taking fluids well, without other problems, you will return to your room or be allowed to go home. °· There is usually minimal discomfort following the surgery because the incisions are so small. °· You will be given pain medicine while you are in the hospital and for when you go home. °This information is not intended to replace advice given to you by your health care provider. Make sure you discuss any questions you have with your health care provider. °Document Released: 09/16/2007 Document Revised: 04/26/2016 Document Reviewed: 06/09/2013 °Elsevier Interactive Patient Education © 2017 [employer] Inc. ° °

## 2017-08-15 NOTE — Assessment & Plan Note (Signed)
Uncontrolled, last A1c 8.4 At that time, however she had not been taking her glipizide for several months We will continue glipizide at current dose Advised on checking blood glucoses Follow-up in 3 months Foot exam completed today Advised to schedule an eye exam Declined flu and Pneumovax today Microalbumin was completed and normal today, but she is supposed to be taking losartan anyways which we will resume

## 2017-08-15 NOTE — Assessment & Plan Note (Signed)
Stable, but mildly hypervolemic with lower extremity edema No crackles on lung exam and breathing comfortably Patient is scheduling an upcoming cardiology appointment Recent CMP within normal limits Resume Lasix, losartan, Coreg Given her history of CHF with a reduced ejection fraction, she likely needs cardiology clearance before her upcoming hysterectomy

## 2017-08-17 LAB — COMPLIANCE DRUG ANALYSIS, UR

## 2017-08-19 ENCOUNTER — Telehealth: Payer: Self-pay | Admitting: *Deleted

## 2017-08-19 ENCOUNTER — Ambulatory Visit (INDEPENDENT_AMBULATORY_CARE_PROVIDER_SITE_OTHER): Payer: BLUE CROSS/BLUE SHIELD | Admitting: Family Medicine

## 2017-08-19 ENCOUNTER — Encounter: Payer: Self-pay | Admitting: Family Medicine

## 2017-08-19 VITALS — BP 138/82 | HR 100 | Temp 98.2°F | Resp 20

## 2017-08-19 DIAGNOSIS — L03115 Cellulitis of right lower limb: Secondary | ICD-10-CM | POA: Diagnosis not present

## 2017-08-19 MED ORDER — CEPHALEXIN 500 MG PO CAPS: 500.0000 mg | ORAL_CAPSULE | Freq: Four times a day (QID) | ORAL | 0 refills | Status: AC

## 2017-08-19 NOTE — Progress Notes (Signed)
Patient: [name] [name] Female    DOB: [birth year]   66 y.o.   MRN: [id number] Visit Date: 08/19/2017  Today's Provider: Lavon Paganini, MD   Chief Complaint  Patient presents with  . Rash   Subjective:    Rash  This is a new problem. Episode onset: x 2 days. The problem is unchanged. The affected locations include the right lower leg. The rash is characterized by redness and pain. She was exposed to nothing. Associated symptoms include fatigue (pt's baseline). Pertinent negatives include no anorexia, congestion, cough, diarrhea, eye pain, facial edema, fever, rhinorrhea, shortness of breath, sore throat or vomiting. Past treatments include nothing.  Pt has a H/O cellulitis. She has been hospitalized for this in the past.     Allergies  Allergen Reactions  . Latex     itching  . Doxycycline Rash  . Sulfa Antibiotics Itching, Rash and Swelling     Current Outpatient Prescriptions:  .  carvedilol (COREG) 3.125 MG tablet, Take 1 tablet (3.125 mg total) by mouth 2 (two) times daily with a meal., Disp: 60 tablet, Rfl: 2 .  cetirizine (ZYRTEC) 10 MG tablet, TAKE 1 TABLET (10 MG TOTAL) BY MOUTH DAILY. (NOT COVER*), Disp: 30 tablet, Rfl: 2 .  citalopram (CELEXA) 40 MG tablet, Take 40 mg by mouth daily., Disp: , Rfl: 3 .  furosemide (LASIX) 20 MG tablet, Take 1 tablet (20 mg total) by mouth daily., Disp: 30 tablet, Rfl: 1 .  gabapentin (NEURONTIN) 300 MG capsule, Take 1 capsule (300 mg total) by mouth 3 (three) times daily., Disp: 90 capsule, Rfl: 2 .  glipiZIDE (GLUCOTROL) 5 MG tablet, Take 1 tablet (5 mg total) by mouth 2 (two) times daily before a meal., Disp: 60 tablet, Rfl: 2 .  glucose blood (ACCU-CHEK AVIVA PLUS) test strip, To check blood sugar once a day.  DX: E11.9, Disp: 100 each, Rfl: 1 .  losartan (COZAAR) 50 MG tablet, Take 1 tablet (50 mg total) by mouth daily., Disp: 30 tablet, Rfl: 1 .  oxyCODONE-acetaminophen (PERCOCET) 10-325 MG tablet, Take 1 tablet by mouth  every 8 (eight) hours as needed., Disp: 90 tablet, Rfl: 0 .  tiZANidine (ZANAFLEX) 4 MG tablet, Take 1 tablet (4 mg total) by mouth 2 (two) times daily as needed for muscle spasms., Disp: 60 tablet, Rfl: 1 .  aspirin 81 MG tablet, Take 1 tablet (81 mg total) by mouth daily. (Patient not taking: Reported on 08/19/2017), Disp: 30 tablet, Rfl: 2  Review of Systems  Constitutional: Positive for fatigue (pt's baseline). Negative for fever.  HENT: Negative for congestion, rhinorrhea and sore throat.   Eyes: Negative for pain.  Respiratory: Negative for cough and shortness of breath.   Cardiovascular: Negative.   Gastrointestinal: Negative for anorexia, diarrhea and vomiting.  Genitourinary: Negative.   Skin: Positive for color change and rash.  Neurological: Negative.   Psychiatric/Behavioral: Negative.     Social History  Substance Use Topics  . Smoking status: Former Smoker    Packs/day: 1.00    Years: 29.00    Quit date: 12/03/1996  . Smokeless tobacco: Never Used  . Alcohol use No   Objective:   BP 138/82 (BP Location: Left Wrist, Patient Position: Sitting, Cuff Size: Large)   Pulse 100   Temp 98.2 F (36.8 C) (Oral)   Resp 20   LMP  (LMP Unknown)  Vitals:   08/19/17 1330  BP: 138/82  Pulse: 100  Resp: 20  Temp: 98.2  F (36.8 C)  TempSrc: Oral     Physical Exam  Constitutional: She is oriented to person, place, and time. She appears well-developed and well-nourished. No distress.  HENT:  Head: Normocephalic and atraumatic.  Eyes: Conjunctivae are normal.  Cardiovascular: Normal rate, regular rhythm, normal heart sounds and intact distal pulses.   No murmur heard. Pulmonary/Chest: Effort normal and breath sounds normal. No respiratory distress. She has no wheezes. She has no rales.  Musculoskeletal: She exhibits edema.  Neurological: She is oriented to person, place, and time.  Skin:  + erythema of distal RLE with TTP over chronic venous stasis changes, no open wounds   Psychiatric: She has a normal mood and affect. Her behavior is normal.  Vitals reviewed.     Assessment & Plan:     1. Cellulitis of right lower extremity - rash consistent with cellulitis - likely at higher risk given chronic venous stasis and skin breakdown - treat with keflex - return precautions discussed Meds ordered this encounter  Medications  . cephALEXin (KEFLEX) 500 MG capsule    Sig: Take 1 capsule (500 mg total) by mouth 4 (four) times daily.    Dispense:  28 capsule    Refill:  0        The entirety of the information documented in the History of Present Illness, Review of Systems and Physical Exam were personally obtained by me. Portions of this information were initially documented by Raquel Sarna Ratchford, CMA and reviewed by me for thoroughness and accuracy.     Lavon Paganini, MD  [facility]

## 2017-08-19 NOTE — Patient Instructions (Signed)

## 2017-08-20 ENCOUNTER — Telehealth: Payer: Self-pay | Admitting: Obstetrics & Gynecology

## 2017-08-20 NOTE — Telephone Encounter (Signed)
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

## 2017-08-20 NOTE — Telephone Encounter (Signed)
Patient called back to say she scheduled an appointment w/ Dr. Nehemiah Massed for this Thursday @ 10:30am.

## 2017-08-20 NOTE — Telephone Encounter (Signed)
Patient is aware of H&P on 08/26/17 @ 1:30pm at [facility] w/ Dr. Kenton Kingfisher, Pre-admit Testing afterwards, and OR on 09/03/17. Patient is aware she needs clearance, was seen at [facility] but has to see cardiology. Patient has not contacted Dr Alveria Apley office yet, but will call and hopes to be seen this week or next. Ext given.

## 2017-08-22 ENCOUNTER — Other Ambulatory Visit: Payer: Self-pay | Admitting: Internal Medicine

## 2017-08-22 ENCOUNTER — Telehealth: Payer: Self-pay | Admitting: Family Medicine

## 2017-08-22 DIAGNOSIS — R079 Chest pain, unspecified: Secondary | ICD-10-CM

## 2017-08-22 DIAGNOSIS — Z01818 Encounter for other preprocedural examination: Secondary | ICD-10-CM

## 2017-08-22 MED ORDER — ALPRAZOLAM 0.5 MG PO TABS: ORAL_TABLET | ORAL | 0 refills | Status: DC

## 2017-08-22 NOTE — Telephone Encounter (Signed)
Please call in Xanax 0.5 mg - take 1 tab prn 60 minutes before procedure. #1 R0  Please notify patient that sedation from this medication can be worsened when taken with opioids.  Virginia Crews, MD, MPH [facility] 08/22/2017 3:36 PM

## 2017-08-22 NOTE — Telephone Encounter (Signed)
Pt states she is going to be schedlued for a EKG and Stress test that she will need to get in a machine.  Pt is asking if she can get a Rx to help with anxiety.  [place].  [phone]

## 2017-08-22 NOTE — Telephone Encounter (Signed)
Done. Prescription called into pharmacy. Patient advised as below.

## 2017-08-22 NOTE — Telephone Encounter (Signed)
Patient is requesting a prescription for anxiety to take before she has the upcoming cardiac test, which requires her to get into a machine.

## 2017-08-26 ENCOUNTER — Ambulatory Visit (INDEPENDENT_AMBULATORY_CARE_PROVIDER_SITE_OTHER): Payer: BLUE CROSS/BLUE SHIELD | Admitting: Obstetrics & Gynecology

## 2017-08-26 ENCOUNTER — Encounter: Payer: Self-pay | Admitting: Obstetrics & Gynecology

## 2017-08-26 ENCOUNTER — Ambulatory Visit
Admission: RE | Admit: 2017-08-26 | Discharge: 2017-08-26 | Disposition: A | Discharge status: Home / Self Care | Payer: BLUE CROSS/BLUE SHIELD | Source: Ambulatory Visit | Attending: Obstetrics & Gynecology | Admitting: Obstetrics & Gynecology

## 2017-08-26 ENCOUNTER — Encounter
Admission: RE | Admit: 2017-08-26 | Discharge: 2017-08-26 | Disposition: A | Discharge status: Home / Self Care | Payer: BLUE CROSS/BLUE SHIELD | Source: Ambulatory Visit | Attending: Obstetrics & Gynecology | Admitting: Obstetrics & Gynecology

## 2017-08-26 VITALS — BP 140/80 | HR 91 | Ht 68.0 in | Wt 331.0 lb

## 2017-08-26 DIAGNOSIS — E119 Type 2 diabetes mellitus without complications: Secondary | ICD-10-CM | POA: Insufficient documentation

## 2017-08-26 DIAGNOSIS — I10 Essential (primary) hypertension: Secondary | ICD-10-CM | POA: Insufficient documentation

## 2017-08-26 DIAGNOSIS — N8502 Endometrial intraepithelial neoplasia [EIN]: Secondary | ICD-10-CM

## 2017-08-26 DIAGNOSIS — Z01818 Encounter for other preprocedural examination: Secondary | ICD-10-CM

## 2017-08-26 DIAGNOSIS — Z01812 Encounter for preprocedural laboratory examination: Secondary | ICD-10-CM | POA: Diagnosis present

## 2017-08-26 DIAGNOSIS — Z01810 Encounter for preprocedural cardiovascular examination: Secondary | ICD-10-CM | POA: Diagnosis present

## 2017-08-26 DIAGNOSIS — I2510 Atherosclerotic heart disease of native coronary artery without angina pectoris: Secondary | ICD-10-CM | POA: Insufficient documentation

## 2017-08-26 LAB — BASIC METABOLIC PANEL
Anion gap: 10 (ref 5–15)
BUN: 16 mg/dL (ref 6–20)
CO2: 31 mmol/L (ref 22–32)
Calcium: 9.4 mg/dL (ref 8.9–10.3)
Chloride: 94 mmol/L — ABNORMAL LOW (ref 101–111)
Creatinine, Ser: 0.57 mg/dL (ref 0.44–1.00)
GFR calc Af Amer: >60 mL/min (ref >60)
GFR calc non Af Amer: >60 mL/min (ref >60)
Glucose, Bld: 125 mg/dL — ABNORMAL HIGH (ref 65–99)
Potassium: 4 mmol/L (ref 3.5–5.1)
Sodium: 135 mmol/L (ref 135–145)

## 2017-08-26 LAB — TYPE AND SCREEN
ABO/RH(D): A POS
Antibody Screen: NEGATIVE

## 2017-08-26 LAB — PROTIME-INR
INR: 0.97
Prothrombin Time: 12.8 seconds (ref 11.4–15.2)

## 2017-08-26 LAB — CBC
HCT: 35.3 % (ref 35.0–47.0)
Hemoglobin: 11.8 g/dL — ABNORMAL LOW (ref 12.0–16.0)
MCH: 26.6 pg (ref 26.0–34.0)
MCHC: 33.3 g/dL (ref 32.0–36.0)
MCV: 80.1 fL (ref 80.0–100.0)
Platelets: 260 10*3/uL (ref 150–440)
RBC: 4.41 MIL/uL (ref 3.80–5.20)
RDW: 15.4 % — ABNORMAL HIGH (ref 11.5–14.5)
WBC: 6 10*3/uL (ref 3.6–11.0)

## 2017-08-26 LAB — APTT: aPTT: 24 seconds (ref 24–36)

## 2017-08-26 NOTE — Patient Instructions (Signed)
Your procedure is scheduled on: Tuesday, September 03, 2017 Report to Same Day Surgery on the 2nd floor in the [facility]. To find out your arrival time, please call [phone] between 1PM - 3PM on: Monday, September 02, 2017  REMEMBER: Instructions that are not followed completely may result in serious medical risk up to and including death; or upon the discretion of your surgeon and anesthesiologist your surgery may need to be rescheduled.  Do not eat food or drink liquids after midnight. No gum chewing or hard candies.  You may however, drink WATER ONLY up to 2 hours before you are scheduled to arrive at the hospital for your procedure.  Do not drink WATER within 2 hours of your scheduled arrival to the hospital as this may lead to your procedure being delayed or rescheduled.  No Alcohol for 24 hours before or after surgery.  No Smoking for 24 hours prior to surgery.  Notify your doctor if there is any change in your medical condition (cold, fever, infection).  Do not wear jewelry, make-up, hairpins, clips or nail polish.  Do not wear lotions, powders, or perfumes.   Do not shave 48 hours prior to surgery.   Contacts and dentures may not be worn into surgery.  Do not bring valuables to the hospital. [facility] is not responsible for any belongings or valuables.   TAKE THESE MEDICATIONS THE MORNING OF SURGERY WITH A SIP OF WATER:  1.  CARVEDILOL 2.  CITALOPRAM 3.  GABAPENTIN  Use CHG Soap or wipes as directed on instruction sheet.  Follow recommendations from Cardiologist or PCP regarding stopping Aspirin.  Stop Anti-inflammatories such as Advil, Aleve, Ibuprofen, Motrin, Naproxen, Naprosyn, Goodie powder, or aspirin products. (May take Tylenol if needed.)  Stop OVER THE COUNTER supplements until after surgery.  If you are being admitted to the hospital overnight, leave your suitcase in the car. After surgery it may be brought to your room.  If you are being discharged  the day of surgery, you will not be allowed to drive home. You will need someone to drive you home and stay with you that night.   If you are taking public transportation, you will need to have a responsible adult to with you.  Please call the number above if you have any questions about these instructions.

## 2017-08-26 NOTE — Progress Notes (Signed)
PRE-OPERATIVE HISTORY AND PHYSICAL EXAM  HPI:  [name] [name] is a 66 y.o. G2P1 No LMP recorded (lmp unknown). Patient is postmenopausal.; she is being admitted for surgery related to [place].  Patient complains of vaginal bleeding. She has been menopausal for several years. Currently on no HRT. Bleeding is described as flow about like a period and has occurred a few times. Other menopausal symptoms include: none. Workup to date: endometrial biopsy and pelvic ultrasound. EMB- COMPLEX HYPERPLASIA WITH ATYPIA INVOLVING AN ENDOMETRIAL POLYP.   PMHx: Past Medical History:  Diagnosis Date  . Arthritis    Rhumetoid arthritis  . BRCA negative 07/2017   MyRisk neg  . Cellulitis of both lower extremities    Chronic  . CHF (congestive heart failure) ([place])   . Coronary artery disease   . Diabetes mellitus without complication ([place])   . Family history of breast cancer 07/2017   MyRisk neg; IBIS=10.5%/riskscore=17%  . Family history of ovarian cancer   . Fibromyalgia   . Fibromyalgia affecting shoulder region   . Hyperlipidemia   . Hypertension   . Spinal stenosis    Past Surgical History:  Procedure Laterality Date  . ABLATION SAPHENOUS VEIN W/ RFA    . ANAL FISSURECTOMY  01/2004  . HERNIA REPAIR    . MEDIAL PARTIAL KNEE REPLACEMENT  01/06/2014  . UMBILICAL HERNIA REPAIR N/A 09/05/2015   Procedure: HERNIA REPAIR incarcerated UMBILICAL ADULT;  Surgeon: Sherri Rad, MD;  Location: [facility] ORS;  Service: General;  Laterality: N/A;  . Vascular Stent  11/04/2013   Family History  Problem Relation Age of Onset  . Hypertension Mother   . Cataracts Mother   . Thyroid disease Mother   . Dementia Mother   . COPD Father   . Dementia Father   . Cataracts Father   . Aneurysm Father        Abdominal & Brain  . Diabetes Father   . Melanoma Father   . Breast cancer Sister 69  . Fibromyalgia Sister   . Migraines Sister   . Hypertension Sister   . Breast cancer  Sister 95  . COPD Sister   . Ovarian cancer Sister 37  . Migraines Brother   . Heart attack Brother   . Colon cancer Paternal Uncle        37s  . Colon cancer Paternal Uncle        2s  . Uterine cancer Cousin   . Uterine cancer Cousin    Social History  Substance Use Topics  . Smoking status: Former Smoker    Packs/day: 1.00    Years: 29.00    Quit date: 12/03/1996  . Smokeless tobacco: Never Used  . Alcohol use No    Current Outpatient Prescriptions:  .  ALPRAZolam (XANAX) 0.5 MG tablet, Take 1 tablet as needed 60 minutes before procedure, Disp: 1 tablet, Rfl: 0 .  aspirin 81 MG tablet, Take 1 tablet (81 mg total) by mouth daily., Disp: 30 tablet, Rfl: 2 .  carvedilol (COREG) 3.125 MG tablet, Take 1 tablet (3.125 mg total) by mouth 2 (two) times daily with a meal., Disp: 60 tablet, Rfl: 2 .  cephALEXin (KEFLEX) 500 MG capsule, Take 1 capsule (500 mg total) by mouth 4 (four) times daily., Disp: 28 capsule, Rfl: 0 .  cetirizine (ZYRTEC) 10 MG tablet, TAKE 1 TABLET (10 MG TOTAL) BY MOUTH DAILY. (NOT COVER*), Disp: 30 tablet, Rfl: 2 .  citalopram (CELEXA) 40  MG tablet, Take 40 mg by mouth daily., Disp: , Rfl: 3 .  furosemide (LASIX) 20 MG tablet, Take 1 tablet (20 mg total) by mouth daily., Disp: 30 tablet, Rfl: 1 .  gabapentin (NEURONTIN) 300 MG capsule, Take 1 capsule (300 mg total) by mouth 3 (three) times daily., Disp: 90 capsule, Rfl: 2 .  glipiZIDE (GLUCOTROL) 5 MG tablet, Take 1 tablet (5 mg total) by mouth 2 (two) times daily before a meal., Disp: 60 tablet, Rfl: 2 .  glucose blood (ACCU-CHEK AVIVA PLUS) test strip, To check blood sugar once a day.  DX: E11.9, Disp: 100 each, Rfl: 1 .  losartan (COZAAR) 50 MG tablet, Take 1 tablet (50 mg total) by mouth daily., Disp: 30 tablet, Rfl: 1 .  oxyCODONE-acetaminophen (PERCOCET) 10-325 MG tablet, Take 1 tablet by mouth every 8 (eight) hours as needed. (Patient taking differently: Take 1 tablet by mouth every 8 (eight) hours as needed  for pain. ), Disp: 90 tablet, Rfl: 0 .  tiZANidine (ZANAFLEX) 4 MG tablet, Take 1 tablet (4 mg total) by mouth 2 (two) times daily as needed for muscle spasms., Disp: 60 tablet, Rfl: 1 Allergies: Latex; Doxycycline; and Sulfa antibiotics  Review of Systems  Constitutional: Negative for chills, fever and malaise/fatigue.  HENT: Negative for congestion, sinus pain and sore throat.   Eyes: Negative for blurred vision and pain.  Respiratory: Negative for cough and wheezing.   Cardiovascular: Negative for chest pain and leg swelling.  Gastrointestinal: Negative for abdominal pain, constipation, diarrhea, heartburn, nausea and vomiting.  Genitourinary: Negative for dysuria, frequency, hematuria and urgency.  Musculoskeletal: Negative for back pain, joint pain, myalgias and neck pain.  Skin: Negative for itching and rash.  Neurological: Negative for dizziness, tremors and weakness.  Endo/Heme/Allergies: Does not bruise/bleed easily.  Psychiatric/Behavioral: Negative for depression. The patient is not nervous/anxious and does not have insomnia.     Objective: BP 140/80   Pulse 91   Ht '5\' 8"'  (1.727 m)   Wt (!) 331 lb (150.1 kg)   LMP  (LMP Unknown)   BMI 50.33 kg/m   Filed Weights   08/26/17 1335  Weight: (!) 331 lb (150.1 kg)   Physical Exam  Constitutional: She is oriented to person, place, and time. She appears well-developed and well-nourished. No distress.  Genitourinary: Rectum normal, vagina normal and uterus normal. Pelvic exam was performed with patient supine. There is no rash or lesion on the right labia. There is no rash or lesion on the left labia. Vagina exhibits no lesion. No bleeding in the vagina. Right adnexum does not display mass and does not display tenderness. Left adnexum does not display mass and does not display tenderness. Cervix does not exhibit motion tenderness, lesion, friability or polyp.   Uterus is mobile and midaxial. Uterus is not enlarged or exhibiting a  mass.  HENT:  Head: Normocephalic and atraumatic. Head is without laceration.  Right Ear: Hearing normal.  Left Ear: Hearing normal.  Nose: No epistaxis.  No foreign bodies.  Mouth/Throat: Uvula is midline, oropharynx is clear and moist and mucous membranes are normal.  Eyes: Pupils are equal, round, and reactive to light.  Neck: Normal range of motion. Neck supple. No thyromegaly present.  Cardiovascular: Normal rate and regular rhythm.  Exam reveals no gallop and no friction rub.   No murmur heard. Pulmonary/Chest: Effort normal and breath sounds normal. No respiratory distress. She has no wheezes. Right breast exhibits no mass, no skin change and no tenderness. Left  breast exhibits no mass, no skin change and no tenderness.  Abdominal: Soft. Bowel sounds are normal. She exhibits no distension. There is no tenderness. There is no rebound.  Musculoskeletal: Normal range of motion.  Neurological: She is alert and oriented to person, place, and time. No cranial nerve deficit.  Skin: Skin is warm and dry.  Psychiatric: She has a normal mood and affect. Judgment normal.  Vitals reviewed. Exam limited due to obesity  Assessment: 1. Atypical endometrial hyperplasia   Options as discussed, proceeding with TLH BSO as able (cardiology clearance pending, anesthesia to see pt today). Alternatives discussed.  I have had a careful discussion with this patient about all the options available and the risk/benefits of each. I have fully informed this patient that surgery may subject her to a variety of discomforts and risks: She understands that most patients have surgery with little difficulty, but problems can happen ranging from minor to fatal. These include nausea, vomiting, pain, bleeding, infection, poor healing, hernia, or formation of adhesions. Unexpected reactions may occur from any drug or anesthetic given. Unintended injury may occur to other pelvic or abdominal structures such as Fallopian  tubes, ovaries, bladder, ureter (tube from kidney to bladder), or bowel. Nerves going from the pelvis to the legs may be injured. Any such injury may require immediate or later additional surgery to correct the problem. Excessive blood loss requiring transfusion is very unlikely but possible. Dangerous blood clots may form in the legs or lungs. Physical and sexual activity will be restricted in varying degrees for an indeterminate period of time but most often 2-6 weeks.  Finally, she understands that it is impossible to list every possible undesirable effect and that the condition for which surgery is done is not always cured or significantly improved, and in rare cases may be even worse.Ample time was given to answer all questions.  Barnett Applebaum, MD, [facility], [facility] 08/26/2017  1:52 PM

## 2017-08-28 ENCOUNTER — Ambulatory Visit: Payer: BLUE CROSS/BLUE SHIELD | Admitting: Gastroenterology

## 2017-08-29 ENCOUNTER — Encounter
Admission: RE | Admit: 2017-08-29 | Discharge: 2017-08-29 | Disposition: A | Discharge status: Home / Self Care | Payer: BLUE CROSS/BLUE SHIELD | Source: Ambulatory Visit | Attending: Internal Medicine | Admitting: Internal Medicine

## 2017-08-29 DIAGNOSIS — Z01818 Encounter for other preprocedural examination: Secondary | ICD-10-CM | POA: Insufficient documentation

## 2017-08-29 DIAGNOSIS — R079 Chest pain, unspecified: Secondary | ICD-10-CM | POA: Diagnosis present

## 2017-08-29 MED ORDER — REGADENOSON 0.4 MG/5ML IV SOLN
0.4000 mg | Freq: Once | INTRAVENOUS | Status: AC
  Administered 2017-08-29: 0.4 mg via INTRAVENOUS

## 2017-08-29 MED ORDER — TECHNETIUM TC 99M TETROFOSMIN IV KIT
30.0000 | PACK | Freq: Once | INTRAVENOUS | Status: AC | PRN
  Administered 2017-08-29: 29.67 via INTRAVENOUS

## 2017-08-30 ENCOUNTER — Encounter: Admission: RE | Admit: 2017-08-30 | Payer: BLUE CROSS/BLUE SHIELD | Source: Ambulatory Visit

## 2017-08-30 ENCOUNTER — Ambulatory Visit: Payer: BLUE CROSS/BLUE SHIELD | Attending: Internal Medicine

## 2017-08-30 LAB — NM MYOCAR MULTI W/SPECT W/WALL MOTION / EF
Estimated workload: 1 METS
Exercise duration (min): 1 min
Exercise duration (sec): 40 s
LV dias vol: 190 mL (ref 46–106)
LV sys vol: 83 mL
Peak HR: 87 {beats}/min
Percent of predicted max HR: 55 %
Rest HR: 72 {beats}/min
SDS: 1
SRS: 13
SSS: 6
Stage 1 HR: 75 {beats}/min
Stage 2 Grade: 0 %
Stage 2 HR: 75 {beats}/min
Stage 2 Speed: 0 mph
Stage 3 Grade: 0 %
Stage 3 HR: 75 {beats}/min
Stage 3 Speed: 0 mph
Stage 4 Grade: 0 %
Stage 4 HR: 87 {beats}/min
Stage 4 Speed: 0 mph
Stage 5 Grade: 0 %
Stage 5 HR: 88 {beats}/min
Stage 5 Speed: 0 mph
Stage 6 Grade: 0 %
Stage 6 HR: 77 {beats}/min
Stage 6 Speed: 0 mph
TID: 1.05

## 2017-08-30 MED ORDER — TECHNETIUM TC 99M TETROFOSMIN IV KIT
30.0000 | PACK | Freq: Once | INTRAVENOUS | Status: AC | PRN
  Administered 2017-08-30: 30.25 via INTRAVENOUS

## 2017-09-02 DIAGNOSIS — I10 Essential (primary) hypertension: Secondary | ICD-10-CM | POA: Insufficient documentation

## 2017-09-02 MED ORDER — CEFOXITIN SODIUM-DEXTROSE 2-2.2 GM-% IV SOLR (PREMIX)
2.0000 g | Freq: Once | INTRAVENOUS | Status: AC
  Administered 2017-09-03: 2 g via INTRAVENOUS

## 2017-09-03 ENCOUNTER — Observation Stay: Payer: BLUE CROSS/BLUE SHIELD

## 2017-09-03 ENCOUNTER — Observation Stay
Admission: RE | Admit: 2017-09-03 | Discharge: 2017-09-06 | Disposition: A | Discharge status: Home / Self Care | Payer: BLUE CROSS/BLUE SHIELD | Source: Ambulatory Visit | Attending: Internal Medicine | Admitting: Internal Medicine

## 2017-09-03 ENCOUNTER — Ambulatory Visit: Payer: BLUE CROSS/BLUE SHIELD | Admitting: Certified Registered Nurse Anesthetist

## 2017-09-03 ENCOUNTER — Encounter
Admission: RE | Disposition: A | Discharge status: Home / Self Care | Payer: Self-pay | Source: Ambulatory Visit | Attending: Internal Medicine

## 2017-09-03 ENCOUNTER — Encounter: Payer: Self-pay | Admitting: *Deleted

## 2017-09-03 DIAGNOSIS — I5023 Acute on chronic systolic (congestive) heart failure: Secondary | ICD-10-CM | POA: Insufficient documentation

## 2017-09-03 DIAGNOSIS — Z7984 Long term (current) use of oral hypoglycemic drugs: Secondary | ICD-10-CM | POA: Insufficient documentation

## 2017-09-03 DIAGNOSIS — M797 Fibromyalgia: Secondary | ICD-10-CM | POA: Diagnosis not present

## 2017-09-03 DIAGNOSIS — I509 Heart failure, unspecified: Secondary | ICD-10-CM

## 2017-09-03 DIAGNOSIS — Z79899 Other long term (current) drug therapy: Secondary | ICD-10-CM | POA: Insufficient documentation

## 2017-09-03 DIAGNOSIS — Z7982 Long term (current) use of aspirin: Secondary | ICD-10-CM | POA: Insufficient documentation

## 2017-09-03 DIAGNOSIS — Z6841 Body Mass Index (BMI) 40.0 and over, adult: Secondary | ICD-10-CM | POA: Insufficient documentation

## 2017-09-03 DIAGNOSIS — E669 Obesity, unspecified: Secondary | ICD-10-CM | POA: Diagnosis not present

## 2017-09-03 DIAGNOSIS — N736 Female pelvic peritoneal adhesions (postinfective): Secondary | ICD-10-CM | POA: Insufficient documentation

## 2017-09-03 DIAGNOSIS — Z96652 Presence of left artificial knee joint: Secondary | ICD-10-CM | POA: Insufficient documentation

## 2017-09-03 DIAGNOSIS — N840 Polyp of corpus uteri: Principal | ICD-10-CM | POA: Insufficient documentation

## 2017-09-03 DIAGNOSIS — Z8041 Family history of malignant neoplasm of ovary: Secondary | ICD-10-CM | POA: Insufficient documentation

## 2017-09-03 DIAGNOSIS — Z9861 Coronary angioplasty status: Secondary | ICD-10-CM | POA: Diagnosis not present

## 2017-09-03 DIAGNOSIS — Z803 Family history of malignant neoplasm of breast: Secondary | ICD-10-CM | POA: Insufficient documentation

## 2017-09-03 DIAGNOSIS — I420 Dilated cardiomyopathy: Secondary | ICD-10-CM | POA: Diagnosis not present

## 2017-09-03 DIAGNOSIS — M069 Rheumatoid arthritis, unspecified: Secondary | ICD-10-CM | POA: Insufficient documentation

## 2017-09-03 DIAGNOSIS — I110 Hypertensive heart disease with heart failure: Secondary | ICD-10-CM | POA: Insufficient documentation

## 2017-09-03 DIAGNOSIS — N838 Other noninflammatory disorders of ovary, fallopian tube and broad ligament: Secondary | ICD-10-CM | POA: Diagnosis not present

## 2017-09-03 DIAGNOSIS — I2510 Atherosclerotic heart disease of native coronary artery without angina pectoris: Secondary | ICD-10-CM | POA: Insufficient documentation

## 2017-09-03 DIAGNOSIS — Z87891 Personal history of nicotine dependence: Secondary | ICD-10-CM | POA: Diagnosis not present

## 2017-09-03 DIAGNOSIS — N8501 Benign endometrial hyperplasia: Secondary | ICD-10-CM | POA: Diagnosis present

## 2017-09-03 DIAGNOSIS — K5900 Constipation, unspecified: Secondary | ICD-10-CM

## 2017-09-03 DIAGNOSIS — N888 Other specified noninflammatory disorders of cervix uteri: Secondary | ICD-10-CM | POA: Diagnosis not present

## 2017-09-03 DIAGNOSIS — N8502 Endometrial intraepithelial neoplasia [EIN]: Secondary | ICD-10-CM | POA: Diagnosis present

## 2017-09-03 DIAGNOSIS — D252 Subserosal leiomyoma of uterus: Secondary | ICD-10-CM | POA: Insufficient documentation

## 2017-09-03 HISTORY — PX: CYSTOSCOPY: SHX5120

## 2017-09-03 HISTORY — PX: LAPAROSCOPIC HYSTERECTOMY: SHX1926

## 2017-09-03 LAB — GLUCOSE, CAPILLARY
Glucose-Capillary: 161 mg/dL — ABNORMAL HIGH (ref 65–99)
Glucose-Capillary: 196 mg/dL — ABNORMAL HIGH (ref 65–99)
Glucose-Capillary: 234 mg/dL — ABNORMAL HIGH (ref 65–99)
Glucose-Capillary: 276 mg/dL — ABNORMAL HIGH (ref 65–99)

## 2017-09-03 LAB — ABO/RH: ABO/RH(D): A POS

## 2017-09-03 SURGERY — HYSTERECTOMY, TOTAL, LAPAROSCOPIC
Anesthesia: General

## 2017-09-03 MED ORDER — NALOXONE HCL 0.4 MG/ML IJ SOLN: 0.4000 mg | INTRAMUSCULAR | Status: DC | PRN

## 2017-09-03 MED ORDER — SODIUM CHLORIDE 0.9% FLUSH: 9.0000 mL | INTRAVENOUS | Status: DC | PRN

## 2017-09-03 MED ORDER — LABETALOL HCL 5 MG/ML IV SOLN
5.0000 mg | Freq: Once | INTRAVENOUS | Status: AC
  Administered 2017-09-03: 5 mg via INTRAVENOUS

## 2017-09-03 MED ORDER — CARVEDILOL 3.125 MG PO TABS
3.1250 mg | ORAL_TABLET | Freq: Two times a day (BID) | ORAL | Status: DC
  Administered 2017-09-03 – 2017-09-06 (×6): 3.125 mg via ORAL
  Filled 2017-09-03 (×6): qty 1

## 2017-09-03 MED ORDER — SUGAMMADEX SODIUM 200 MG/2ML IV SOLN
INTRAVENOUS | Status: AC
  Filled 2017-09-03: qty 2

## 2017-09-03 MED ORDER — FUROSEMIDE 20 MG PO TABS
20.0000 mg | ORAL_TABLET | Freq: Every day | ORAL | Status: DC
  Administered 2017-09-04 – 2017-09-06 (×3): 20 mg via ORAL
  Filled 2017-09-03 (×3): qty 1

## 2017-09-03 MED ORDER — IPRATROPIUM-ALBUTEROL 0.5-2.5 (3) MG/3ML IN SOLN: 3.0000 mL | Freq: Four times a day (QID) | RESPIRATORY_TRACT | Status: DC | PRN

## 2017-09-03 MED ORDER — FAMOTIDINE 20 MG PO TABS
ORAL_TABLET | ORAL | Status: AC
  Administered 2017-09-03: 20 mg via ORAL
  Filled 2017-09-03: qty 1

## 2017-09-03 MED ORDER — MORPHINE SULFATE (PF) 2 MG/ML IV SOLN
1.0000 mg | INTRAVENOUS | Status: DC | PRN
  Administered 2017-09-03: 1 mg via INTRAVENOUS
  Filled 2017-09-03: qty 1

## 2017-09-03 MED ORDER — ROCURONIUM BROMIDE 100 MG/10ML IV SOLN
INTRAVENOUS | Status: DC | PRN
  Administered 2017-09-03: 10 mg via INTRAVENOUS
  Administered 2017-09-03: 15 mg via INTRAVENOUS
  Administered 2017-09-03 (×2): 10 mg via INTRAVENOUS
  Administered 2017-09-03: 50 mg via INTRAVENOUS
  Administered 2017-09-03: 10 mg via INTRAVENOUS
  Administered 2017-09-03: 15 mg via INTRAVENOUS
  Administered 2017-09-03: 10 mg via INTRAVENOUS
  Administered 2017-09-03: 20 mg via INTRAVENOUS

## 2017-09-03 MED ORDER — INSULIN ASPART 100 UNIT/ML ~~LOC~~ SOLN
0.0000 [IU] | Freq: Every day | SUBCUTANEOUS | Status: DC
  Administered 2017-09-03: 3 [IU] via SUBCUTANEOUS
  Filled 2017-09-03: qty 1

## 2017-09-03 MED ORDER — DEXAMETHASONE SODIUM PHOSPHATE 10 MG/ML IJ SOLN
INTRAMUSCULAR | Status: AC
  Filled 2017-09-03: qty 1

## 2017-09-03 MED ORDER — PROPOFOL 10 MG/ML IV BOLUS
INTRAVENOUS | Status: DC | PRN
  Administered 2017-09-03: 200 mg via INTRAVENOUS

## 2017-09-03 MED ORDER — ONDANSETRON HCL 4 MG/2ML IJ SOLN: 4.0000 mg | Freq: Four times a day (QID) | INTRAMUSCULAR | Status: DC | PRN

## 2017-09-03 MED ORDER — FENTANYL CITRATE (PF) 100 MCG/2ML IJ SOLN
25.0000 ug | INTRAMUSCULAR | Status: DC | PRN
  Administered 2017-09-03: 25 ug via INTRAVENOUS
  Administered 2017-09-03: 50 ug via INTRAVENOUS
  Administered 2017-09-03: 25 ug via INTRAVENOUS

## 2017-09-03 MED ORDER — DIPHENHYDRAMINE HCL 12.5 MG/5ML PO ELIX
12.5000 mg | ORAL_SOLUTION | Freq: Four times a day (QID) | ORAL | Status: DC | PRN
  Filled 2017-09-03: qty 5

## 2017-09-03 MED ORDER — FUROSEMIDE 10 MG/ML IJ SOLN
20.0000 mg | Freq: Once | INTRAMUSCULAR | Status: AC
  Administered 2017-09-03: 20 mg via INTRAVENOUS
  Filled 2017-09-03: qty 2

## 2017-09-03 MED ORDER — DEXAMETHASONE SODIUM PHOSPHATE 10 MG/ML IJ SOLN
INTRAMUSCULAR | Status: DC | PRN
  Administered 2017-09-03: 6 mg via INTRAVENOUS

## 2017-09-03 MED ORDER — LOSARTAN POTASSIUM 50 MG PO TABS
50.0000 mg | ORAL_TABLET | Freq: Every day | ORAL | Status: DC
  Administered 2017-09-03 – 2017-09-06 (×4): 50 mg via ORAL
  Filled 2017-09-03 (×4): qty 1

## 2017-09-03 MED ORDER — HYDRALAZINE HCL 20 MG/ML IJ SOLN
10.0000 mg | Freq: Four times a day (QID) | INTRAMUSCULAR | Status: DC | PRN
  Filled 2017-09-03: qty 0.5

## 2017-09-03 MED ORDER — SUGAMMADEX SODIUM 500 MG/5ML IV SOLN
INTRAVENOUS | Status: AC
  Filled 2017-09-03: qty 5

## 2017-09-03 MED ORDER — LACTATED RINGERS IV SOLN: INTRAVENOUS | Status: DC

## 2017-09-03 MED ORDER — PROPOFOL 10 MG/ML IV BOLUS
INTRAVENOUS | Status: AC
  Filled 2017-09-03: qty 20

## 2017-09-03 MED ORDER — FENTANYL CITRATE (PF) 100 MCG/2ML IJ SOLN
INTRAMUSCULAR | Status: AC
  Filled 2017-09-03: qty 2

## 2017-09-03 MED ORDER — FAMOTIDINE 20 MG PO TABS
20.0000 mg | ORAL_TABLET | Freq: Once | ORAL | Status: AC
  Administered 2017-09-03: 20 mg via ORAL

## 2017-09-03 MED ORDER — METOPROLOL TARTRATE 5 MG/5ML IV SOLN
INTRAVENOUS | Status: DC | PRN
  Administered 2017-09-03: 2.5 mg via INTRAVENOUS

## 2017-09-03 MED ORDER — MIDAZOLAM HCL 2 MG/2ML IJ SOLN
INTRAMUSCULAR | Status: DC | PRN
  Administered 2017-09-03: 2 mg via INTRAVENOUS

## 2017-09-03 MED ORDER — SUCCINYLCHOLINE CHLORIDE 20 MG/ML IJ SOLN
INTRAMUSCULAR | Status: AC
  Filled 2017-09-03: qty 1

## 2017-09-03 MED ORDER — ONDANSETRON HCL 4 MG PO TABS: 4.0000 mg | ORAL_TABLET | Freq: Four times a day (QID) | ORAL | Status: DC | PRN

## 2017-09-03 MED ORDER — ONDANSETRON HCL 4 MG/2ML IJ SOLN
INTRAMUSCULAR | Status: AC
  Filled 2017-09-03: qty 2

## 2017-09-03 MED ORDER — LABETALOL HCL 5 MG/ML IV SOLN
INTRAVENOUS | Status: AC
  Filled 2017-09-03: qty 4

## 2017-09-03 MED ORDER — PANTOPRAZOLE SODIUM 40 MG IV SOLR
40.0000 mg | INTRAVENOUS | Status: DC
  Administered 2017-09-03 – 2017-09-05 (×2): 40 mg via INTRAVENOUS
  Filled 2017-09-03 (×3): qty 40

## 2017-09-03 MED ORDER — CEFOXITIN SODIUM-DEXTROSE 2-2.2 GM-% IV SOLR (PREMIX)
INTRAVENOUS | Status: AC
  Filled 2017-09-03: qty 50

## 2017-09-03 MED ORDER — SIMETHICONE 80 MG PO CHEW
80.0000 mg | CHEWABLE_TABLET | Freq: Four times a day (QID) | ORAL | Status: DC | PRN
  Administered 2017-09-06: 80 mg via ORAL
  Filled 2017-09-03 (×2): qty 1

## 2017-09-03 MED ORDER — HYDROMORPHONE HCL 1 MG/ML IJ SOLN
INTRAMUSCULAR | Status: AC
  Filled 2017-09-03: qty 1

## 2017-09-03 MED ORDER — EPHEDRINE SULFATE 50 MG/ML IJ SOLN
INTRAMUSCULAR | Status: DC | PRN
  Administered 2017-09-03: 10 mg via INTRAVENOUS

## 2017-09-03 MED ORDER — SUGAMMADEX SODIUM 200 MG/2ML IV SOLN
INTRAVENOUS | Status: DC | PRN
  Administered 2017-09-03: 300 mg via INTRAVENOUS

## 2017-09-03 MED ORDER — DIPHENHYDRAMINE HCL 50 MG/ML IJ SOLN: 12.5000 mg | Freq: Four times a day (QID) | INTRAMUSCULAR | Status: DC | PRN

## 2017-09-03 MED ORDER — DOCUSATE SODIUM 100 MG PO CAPS
100.0000 mg | ORAL_CAPSULE | Freq: Two times a day (BID) | ORAL | Status: DC
  Administered 2017-09-03 – 2017-09-06 (×5): 100 mg via ORAL
  Filled 2017-09-03 (×5): qty 1

## 2017-09-03 MED ORDER — ACETAMINOPHEN 10 MG/ML IV SOLN
INTRAVENOUS | Status: DC | PRN
  Administered 2017-09-03: 1000 mg via INTRAVENOUS

## 2017-09-03 MED ORDER — BUPIVACAINE HCL (PF) 0.5 % IJ SOLN
INTRAMUSCULAR | Status: DC | PRN
  Administered 2017-09-03: 19 mL

## 2017-09-03 MED ORDER — BUPIVACAINE HCL (PF) 0.5 % IJ SOLN
INTRAMUSCULAR | Status: AC
  Filled 2017-09-03: qty 30

## 2017-09-03 MED ORDER — MORPHINE SULFATE 2 MG/ML IV SOLN
INTRAVENOUS | Status: DC
  Administered 2017-09-03: 19:00:00 via INTRAVENOUS
  Administered 2017-09-04: 15 mg via INTRAVENOUS
  Administered 2017-09-04: 14 mg via INTRAVENOUS
  Administered 2017-09-04: 19.5 mg via INTRAVENOUS
  Administered 2017-09-04: 04:00:00 via INTRAVENOUS
  Filled 2017-09-03 (×3): qty 30

## 2017-09-03 MED ORDER — GABAPENTIN 300 MG PO CAPS
300.0000 mg | ORAL_CAPSULE | Freq: Three times a day (TID) | ORAL | Status: DC
  Administered 2017-09-03 – 2017-09-06 (×7): 300 mg via ORAL
  Filled 2017-09-03 (×7): qty 1

## 2017-09-03 MED ORDER — ACETAMINOPHEN NICU IV SYRINGE 10 MG/ML
INTRAVENOUS | Status: AC
  Filled 2017-09-03: qty 1

## 2017-09-03 MED ORDER — KETOROLAC TROMETHAMINE 30 MG/ML IJ SOLN
INTRAMUSCULAR | Status: DC | PRN
  Administered 2017-09-03: 15 mg via INTRAVENOUS

## 2017-09-03 MED ORDER — FENTANYL CITRATE (PF) 100 MCG/2ML IJ SOLN
INTRAMUSCULAR | Status: DC | PRN
  Administered 2017-09-03 (×2): 50 ug via INTRAVENOUS

## 2017-09-03 MED ORDER — HYDROMORPHONE HCL 1 MG/ML IJ SOLN
INTRAMUSCULAR | Status: DC | PRN
  Administered 2017-09-03: 0.5 mg via INTRAVENOUS

## 2017-09-03 MED ORDER — EPHEDRINE SULFATE 50 MG/ML IJ SOLN
INTRAMUSCULAR | Status: AC
  Filled 2017-09-03: qty 1

## 2017-09-03 MED ORDER — MIDAZOLAM HCL 2 MG/2ML IJ SOLN
INTRAMUSCULAR | Status: AC
  Filled 2017-09-03: qty 2

## 2017-09-03 MED ORDER — ONDANSETRON HCL 4 MG/2ML IJ SOLN
INTRAMUSCULAR | Status: DC | PRN
  Administered 2017-09-03: 4 mg via INTRAVENOUS

## 2017-09-03 MED ORDER — INSULIN ASPART 100 UNIT/ML ~~LOC~~ SOLN
0.0000 [IU] | Freq: Three times a day (TID) | SUBCUTANEOUS | Status: DC
  Administered 2017-09-04 – 2017-09-06 (×6): 2 [IU] via SUBCUTANEOUS
  Administered 2017-09-06: 1 [IU] via SUBCUTANEOUS
  Filled 2017-09-03 (×7): qty 1

## 2017-09-03 MED ORDER — LIDOCAINE HCL (CARDIAC) 20 MG/ML IV SOLN
INTRAVENOUS | Status: DC | PRN
  Administered 2017-09-03: 80 mg via INTRAVENOUS

## 2017-09-03 MED ORDER — METOPROLOL TARTRATE 5 MG/5ML IV SOLN
INTRAVENOUS | Status: AC
  Filled 2017-09-03: qty 5

## 2017-09-03 MED ORDER — ROCURONIUM BROMIDE 50 MG/5ML IV SOLN
INTRAVENOUS | Status: AC
  Filled 2017-09-03: qty 1

## 2017-09-03 MED ORDER — SENNOSIDES-DOCUSATE SODIUM 8.6-50 MG PO TABS: 1.0000 | ORAL_TABLET | Freq: Every evening | ORAL | Status: DC | PRN

## 2017-09-03 MED ORDER — ACETAMINOPHEN 325 MG PO TABS: 650.0000 mg | ORAL_TABLET | ORAL | Status: DC | PRN

## 2017-09-03 MED ORDER — GLIPIZIDE 5 MG PO TABS
5.0000 mg | ORAL_TABLET | Freq: Two times a day (BID) | ORAL | Status: DC
  Administered 2017-09-03: 5 mg via ORAL
  Filled 2017-09-03 (×2): qty 1

## 2017-09-03 MED ORDER — SODIUM CHLORIDE 0.9 % IV SOLN
INTRAVENOUS | Status: DC
  Administered 2017-09-03 (×2): via INTRAVENOUS

## 2017-09-03 MED ORDER — PROMETHAZINE HCL 25 MG/ML IJ SOLN: 6.2500 mg | INTRAMUSCULAR | Status: DC | PRN

## 2017-09-03 MED ORDER — OXYCODONE-ACETAMINOPHEN 5-325 MG PO TABS
1.0000 | ORAL_TABLET | ORAL | Status: DC | PRN
  Administered 2017-09-03 – 2017-09-05 (×5): 2 via ORAL
  Filled 2017-09-03 (×5): qty 2

## 2017-09-03 MED ORDER — GLYCOPYRROLATE 0.2 MG/ML IJ SOLN
INTRAMUSCULAR | Status: AC
  Filled 2017-09-03: qty 1

## 2017-09-03 MED ORDER — SUCCINYLCHOLINE CHLORIDE 20 MG/ML IJ SOLN
INTRAMUSCULAR | Status: DC | PRN
  Administered 2017-09-03: 100 mg via INTRAVENOUS

## 2017-09-03 MED ORDER — LIDOCAINE HCL (PF) 2 % IJ SOLN
INTRAMUSCULAR | Status: AC
  Filled 2017-09-03: qty 4

## 2017-09-03 SURGICAL SUPPLY — 50 items
BAG URO DRAIN 2000ML W/SPOUT (MISCELLANEOUS) ×3 IMPLANT
BLADE SURG SZ11 CARB STEEL (BLADE) ×3 IMPLANT
CANISTER SUCT 1200ML W/VALVE (MISCELLANEOUS) ×3 IMPLANT
CATH FOLEY 2WAY  5CC 16FR (CATHETERS) ×1
CATH URTH 16FR FL 2W BLN LF (CATHETERS) ×2 IMPLANT
CHLORAPREP W/TINT 26ML (MISCELLANEOUS) ×3 IMPLANT
DEFOGGER SCOPE WARMER CLEARIFY (MISCELLANEOUS) ×3 IMPLANT
DERMABOND ADVANCED (GAUZE/BANDAGES/DRESSINGS) ×1
DERMABOND ADVANCED .7 DNX12 (GAUZE/BANDAGES/DRESSINGS) ×2 IMPLANT
DEVICE SUTURE ENDOST 10MM (ENDOMECHANICALS) ×3 IMPLANT
DRAPE CAMERA CLOSED 9X96 (DRAPES) ×3 IMPLANT
DRSG TEGADERM 2-3/8X2-3/4 SM (GAUZE/BANDAGES/DRESSINGS) IMPLANT
ENDOSTITCH 0 SINGLE 48 (SUTURE) IMPLANT
GAUZE SPONGE NON-WVN 2X2 STRL (MISCELLANEOUS) IMPLANT
GLOVE BIO SURGEON STRL SZ8 (GLOVE) ×15 IMPLANT
GLOVE INDICATOR 8.0 STRL GRN (GLOVE) ×15 IMPLANT
GOWN STRL REUS W/ TWL LRG LVL3 (GOWN DISPOSABLE) ×2 IMPLANT
GOWN STRL REUS W/ TWL XL LVL3 (GOWN DISPOSABLE) ×4 IMPLANT
GOWN STRL REUS W/TWL LRG LVL3 (GOWN DISPOSABLE) ×1
GOWN STRL REUS W/TWL XL LVL3 (GOWN DISPOSABLE) ×2
GRASPER SUT TROCAR 14GX15 (MISCELLANEOUS) ×3 IMPLANT
IRRIGATION STRYKERFLOW (MISCELLANEOUS) ×2 IMPLANT
IRRIGATOR STRYKERFLOW (MISCELLANEOUS) ×3
IV LACTATED RINGERS 1000ML (IV SOLUTION) ×3 IMPLANT
KIT PINK PAD W/HEAD ARE REST (MISCELLANEOUS) ×3
KIT PINK PAD W/HEAD ARM REST (MISCELLANEOUS) ×2 IMPLANT
KIT RM TURNOVER CYSTO AR (KITS) ×3 IMPLANT
LABEL OR SOLS (LABEL) ×3 IMPLANT
MANIPULATOR VCARE LG CRV RETR (MISCELLANEOUS) IMPLANT
MANIPULATOR VCARE SML CRV RETR (MISCELLANEOUS) IMPLANT
MANIPULATOR VCARE STD CRV RETR (MISCELLANEOUS) ×3 IMPLANT
NEEDLE VERESS 14GA 120MM (NEEDLE) ×3 IMPLANT
NS IRRIG 500ML POUR BTL (IV SOLUTION) ×3 IMPLANT
OCCLUDER COLPOPNEUMO (BALLOONS) ×3 IMPLANT
PACK GYN LAPAROSCOPIC (MISCELLANEOUS) ×3 IMPLANT
PAD OB MATERNITY 4.3X12.25 (PERSONAL CARE ITEMS) ×3 IMPLANT
PAD PREP 24X41 OB/GYN DISP (PERSONAL CARE ITEMS) ×3 IMPLANT
SCISSORS METZENBAUM CVD 33 (INSTRUMENTS) ×3 IMPLANT
SET CYSTO W/LG BORE CLAMP LF (SET/KITS/TRAYS/PACK) ×3 IMPLANT
SHEARS HARMONIC ACE PLUS 36CM (ENDOMECHANICALS) ×3 IMPLANT
SLEEVE ENDOPATH XCEL 5M (ENDOMECHANICALS) ×6 IMPLANT
SPONGE VERSALON 2X2 STRL (MISCELLANEOUS)
SUT ENDO VLOC 180-0-8IN (SUTURE) ×9 IMPLANT
SUT VIC AB 0 CT1 36 (SUTURE) ×3 IMPLANT
SYR 50ML LL SCALE MARK (SYRINGE) ×3 IMPLANT
SYRINGE 10CC LL (SYRINGE) ×3 IMPLANT
TROCAR 5M 150ML BLDLS (TROCAR) ×3 IMPLANT
TROCAR ENDO BLADELESS 11MM (ENDOMECHANICALS) ×3 IMPLANT
TROCAR XCEL NON-BLD 5MMX100MML (ENDOMECHANICALS) ×3 IMPLANT
TUBING INSUF HEATED (TUBING) ×3 IMPLANT

## 2017-09-03 NOTE — Progress Notes (Signed)
Day of Surgery Procedure(s) (LRB): HYSTERECTOMY TOTAL LAPAROSCOPIC BSO (N/A) CYSTOSCOPY  Subjective: Patient reports pain is in lower pelvis and moderate to severe..    Objective: I have reviewed patient's vital signs, intake and output and medications.  Abd: Min T, ND Incision: clean, dry and intact Extr: no calf T, no edema  Assessment: s/p Procedure(s): HYSTERECTOMY TOTAL LAPAROSCOPIC BSO (N/A) CYSTOSCOPY: stable  Plan: 1. Analgesia, Percocet po as tolerated.  Consider PCA but not usual for this type of surgery.  2. Meds restarted for some things, holding lasix today and celexa for reasons of surgery today and their effects/ drug-drug interactions 3. Post op expectations discussed 4. Advance diet as tolerated.  Ambulate when ready 5. Foley to be removed in am 6.  IS, deep breathing encouraged.   LOS: 0 days    [name] [name] 09/03/2017, 4:32 PM

## 2017-09-03 NOTE — Transfer of Care (Signed)
Immediate Anesthesia Transfer of Care Note  Patient: [name] [name]  Procedure(s) Performed: HYSTERECTOMY TOTAL LAPAROSCOPIC BSO (N/A ) CYSTOSCOPY  Patient Location: PACU  Anesthesia Type:General  Level of Consciousness: responds to stimulation  Airway & Oxygen Therapy: Patient Spontanous Breathing and Patient connected to face mask oxygen  Post-op Assessment: Report given to RN and Post -op Vital signs reviewed and stable  Post vital signs: Reviewed and stable  Last Vitals:  Vitals:   09/03/17 0933 09/03/17 1341  BP: (!) 155/72 (!) 164/92  Pulse: 70 78  Resp: 20 18  Temp: 36.6 C 36.6 C  SpO2: 95% 99%    Last Pain:  Vitals:   09/03/17 1341  TempSrc: Temporal  PainSc: Asleep         Complications: No apparent anesthesia complications

## 2017-09-03 NOTE — Anesthesia Procedure Notes (Signed)
Procedure Name: Intubation Date/Time: 09/03/2017 10:31 AM Performed by: Darlyne Russian Pre-anesthesia Checklist: Patient identified, Emergency Drugs available, Suction available, Patient being monitored and Timeout performed Patient Re-evaluated:Patient Re-evaluated prior to induction Oxygen Delivery Method: Circle system utilized Preoxygenation: Pre-oxygenation with 100% oxygen Induction Type: IV induction Ventilation: Mask ventilation without difficulty Laryngoscope Size: McGraph and 3 Grade View: Grade I Tube type: Oral Tube size: 7.0 mm Number of attempts: 1 Airway Equipment and Method: Stylet and Video-laryngoscopy Placement Confirmation: ETT inserted through vocal cords under direct vision,  breath sounds checked- equal and bilateral and positive ETCO2 Secured at: 22 cm Tube secured with: Tape Dental Injury: Teeth and Oropharynx as per pre-operative assessment  Difficulty Due To: Difficulty was anticipated Future Recommendations: Recommend- induction with short-acting agent, and alternative techniques readily available Comments: Elective video laryngoscopy.

## 2017-09-03 NOTE — Progress Notes (Addendum)
Paged Dr.Guru to notify about [facility] consult per Dr.Harris.  Dr.Guru made aware of PCOT BS of 234 (Dr.Harris made aware)  per MD notify Diabetes coordinator.  Will Notify Diabetes coordinator.   Update: Diabetes coordinator paged no response. Dr.Guru en route at pt's bedside at 1740 see new orders

## 2017-09-03 NOTE — Progress Notes (Signed)
15 minute call to floor. 

## 2017-09-03 NOTE — Consult Note (Signed)
Reason for Consult: No chief complaint on file.  Referring Physician: Cheria Sadiq is an 66 y.o. female.  HPI:  66 year old morbidly obese female got admitted to gynecology service for elective laparoscopic hysterectomy. Patient just had surgery and under a lot of abdominal pain during my examination. Hospitalist team is consulted for hypertension and diabetes mellitus management. Patient is still lethargic from the anesthesia but answering few questions   Past Medical History:  Diagnosis Date  . Arthritis    Rhumetoid arthritis  . BRCA negative 07/2017   MyRisk neg  . Cellulitis of both lower extremities    Chronic  . CHF (congestive heart failure) ([place])   . Coronary artery disease   . Diabetes mellitus without complication ([place])   . Family history of breast cancer 07/2017   MyRisk neg; IBIS=10.5%/riskscore=17%  . Family history of ovarian cancer   . Fibromyalgia   . Fibromyalgia affecting shoulder region   . Hyperlipidemia   . Hypertension   . Spinal stenosis     Past Surgical History:  Procedure Laterality Date  . ABLATION SAPHENOUS VEIN W/ RFA    . ANAL FISSURECTOMY  01/2004  . CORONARY ANGIOPLASTY    . DILATION AND CURETTAGE OF UTERUS    . HERNIA REPAIR    . MEDIAL PARTIAL KNEE REPLACEMENT Left 01/06/2014  . UMBILICAL HERNIA REPAIR N/A 09/05/2015   Procedure: HERNIA REPAIR incarcerated UMBILICAL ADULT;  Surgeon: Sherri Rad, MD;  Location: [facility] ORS;  Service: General;  Laterality: N/A;  . Vascular Stent  11/04/2013    Family History  Problem Relation Age of Onset  . Hypertension Mother   . Cataracts Mother   . Thyroid disease Mother   . Dementia Mother   . COPD Father   . Dementia Father   . Cataracts Father   . Aneurysm Father        Abdominal & Brain  . Diabetes Father   . Melanoma Father   . Breast cancer Sister 28  . Fibromyalgia Sister   . Migraines Sister   . Hypertension Sister   . Breast cancer Sister 68  . COPD Sister   . Ovarian cancer  Sister 50  . Migraines Brother   . Heart attack Brother   . Colon cancer Paternal Uncle        32s  . Colon cancer Paternal Uncle        77s  . Uterine cancer Cousin   . Uterine cancer Cousin     Social History:  reports that she quit smoking about 20 years ago. She has a 29.00 pack-year smoking history. She has never used smokeless tobacco. She reports that she does not drink alcohol or use drugs.  Allergies:  Allergies  Allergen Reactions  . Latex Itching    itching  . Doxycycline Rash  . Sulfa Antibiotics Itching, Rash and Swelling    Medications: I have reviewed the patient's current medications.  Results for orders placed or performed during the hospital encounter of 09/03/17 (from the past 48 hour(s))  Glucose, capillary     Status: Abnormal   Collection Time: 09/03/17  9:00 AM  Result Value Ref Range   Glucose-Capillary 161 (H) 65 - 99 mg/dL  ABO/Rh     Status: None   Collection Time: 09/03/17  9:19 AM  Result Value Ref Range   ABO/RH(D) A POS   Glucose, capillary     Status: Abnormal   Collection Time: 09/03/17  1:56 PM  Result Value Ref  Range   Glucose-Capillary 196 (H) 65 - 99 mg/dL  Glucose, capillary     Status: Abnormal   Collection Time: 09/03/17  4:16 PM  Result Value Ref Range   Glucose-Capillary 234 (H) 65 - 99 mg/dL    No results found.  ROS: limited CONSTITUTIONAL: Denies fevers, chills. Denies any fatigue, weakness.  RESPIRATORY: Denies cough, wheeze, shortness of breath.  CARDIOVASCULAR: Denies chest pain, palpitations, edema.  GASTROINTESTINAL: Denies nausea, vomiting, diarrhea, reporting lower  abdominal pain.  GENITOURINARY: Denies dysuria, hematuria. MUSCULOSKELETAL: Denies pain in neck, back, shoulder, knees, hips or arthritic symptoms.  NEUROLOGIC: Denies paralysis, paresthesias.  Blood pressure (!) 170/59, pulse 66, temperature (!) 97.4 F (36.3 C), temperature source Oral, resp. rate 18, weight (!) 164.7 kg (363 lb), SpO2 96  %.   PHYSICAL EXAMINATION:  GENERAL: Well-nourished, well-developed ,  currently in no acute distress.  HEAD: Normocephalic, atraumatic.  EYES: Pupils equal, round, and reactive to light.  No scleral icterus.  MOUTH: Moist mucosal membranes. Dentition intact. No abscess noted. EARS, NOSE, THROAT: Clear without exudates. No external lesions.  NECK: Supple. No thyromegaly. No nodules. No JVD.  PULMONARY: Clear to auscultation bilaterally without wheezes, rales, or rhonchi. No use of accessory muscles. Good respiratory effort. CHEST: Nontender to palpation.  CARDIOVASCULAR: S1, S2, regular rate and rhythm. No murmurs, rubs, or gallops.  GASTROINTESTINAL: Soft, tender, nondistended. Status post laparoscopic hysterectomy MUSCULOSKELETAL: No swelling, clubbing, edema. Range of motion full in all extremities. NEUROLOGIC: Cranial nerves II-XII intact. No gross focal neurological deficits. Sensation intact. Reflexes intact. SKIN: No ulcerations, lesions, rash, cyanosis. Skin warm, dry. Turgor intact.    Assessment/Plan:  66 year old morbidly obese female got admitted to gynecology service for elective laparoscopic hysterectomy. Patient just had surgery and under a lot of abdominal pain during my examination. Hospitalist team is consulted for hypertension and diabetes mellitus management. Patient is still lethargic from the anesthesia  # Essential hypertension uncontrolled- from uncontrolled pain Resume home medications including Coreg, losartan Continue Lasix Pain management by Dr. Kenton Kingfisher Hydralazine IV as needed for uncontrolled blood pressure  #Non-insulin requiring diabetes mellitus Currently patient is on clear liquid diet Hold off on glipizide Insulin sliding scale  #History of chronic systolic congestive heart failure  currently patient is not fluid overloaded Provide gentle hydration with IV fluids as patient is postop with close monitoring for symptoms and signs of fluid  overload Patient is getting Lasix at home medication Continue home medication Coreg, losartan [place] cardiology if needed Patient reports she just had echocardiogram done prior to this surgery, results are pending, previous echocardiogram with EF 25-30%  #History of gastroduodenal ulcer Provide Protonix  Thank you Dr. Kenton Kingfisher for allowing hospitalist team to take care of this patient  TOTAL TIME TAKING CARE OF THIS PATIENT: 45 minutes.   Note: This dictation was prepared with Dragon dictation along with smaller phrase technology. Any transcriptional errors that result from this process are unintentional.   '@[facility]' @ Pager - [phone] 09/03/2017, 5:43 PM

## 2017-09-03 NOTE — Progress Notes (Signed)
[facility] notified of elevated blood pressure, to give labetolol as ordered.

## 2017-09-03 NOTE — Anesthesia Preprocedure Evaluation (Addendum)
Anesthesia Evaluation  Patient identified by MRN, date of birth, ID band Patient awake    Reviewed: Allergy & Precautions, H&P , NPO status , Patient's Chart, lab work & pertinent test results  History of Anesthesia Complications Negative for: history of anesthetic complications  Airway Mallampati: [name]  TM Distance: >3 FB Neck ROM: full    Dental  (+) Poor Dentition, Chipped, Missing, Loose, Dental Advidsory Given   Pulmonary shortness of breath, sleep apnea (likely based on history and PE, but never tested) , neg COPD, neg recent URI, former smoker,           Cardiovascular Exercise Tolerance: Poor hypertension, (-) angina+ CAD, + Past MI, + Cardiac Stents, + Peripheral Vascular Disease and +CHF  (-) CABG (-) dysrhythmias (-) Valvular Problems/Murmurs     Neuro/Psych  Headaches, neg Seizures PSYCHIATRIC DISORDERS  Neuromuscular disease    GI/Hepatic Neg liver ROS, PUD, neg GERD  ,  Endo/Other  diabetesMorbid obesity  Renal/GU negative Renal ROS  negative genitourinary   Musculoskeletal  (+) Arthritis ,   Abdominal   Peds  Hematology negative hematology ROS (+)   Anesthesia Other Findings Past Medical History: No date: Arthritis     Comment:  Rhumetoid arthritis 07/2017: BRCA negative     Comment:  MyRisk neg No date: Cellulitis of both lower extremities     Comment:  Chronic No date: CHF (congestive heart failure) ([facility]) No date: Coronary artery disease No date: Diabetes mellitus without complication ([place]) 19/7588: Family history of breast cancer     Comment:  MyRisk neg; IBIS=10.5%/riskscore=17% No date: Family history of ovarian cancer No date: Fibromyalgia No date: Fibromyalgia affecting shoulder region No date: Hyperlipidemia No date: Hypertension No date: Spinal stenosis    Reproductive/Obstetrics negative OB ROS                            Anesthesia Physical  Anesthesia  Plan  ASA: [name]  Anesthesia Plan: General ETT   Post-op Pain Management:    Induction: Intravenous  PONV Risk Score and Plan: 3 and Ondansetron, Dexamethasone and Midazolam  Airway Management Planned: Oral ETT  Additional Equipment:   Intra-op Plan:   Post-operative Plan: Extubation in OR  Informed Consent: I have reviewed the patients History and Physical, chart, labs and discussed the procedure including the risks, benefits and alternatives for the proposed anesthesia with the patient or authorized representative who has indicated his/her understanding and acceptance.   Dental Advisory Given  Plan Discussed with: Anesthesiologist, CRNA and Surgeon  Anesthesia Plan Comments:        Anesthesia Quick Evaluation

## 2017-09-03 NOTE — Progress Notes (Signed)
Nurse concerned about patient with increasing O2 requirement and crackles on lung examination. Patient with recent tress test showing Ef approx 35%.  Will tx to tele for closer monitoring Lasix 20 mg IV once CXR ordered [facility] cards consult in am May need to increase BP meds Cont ISS to prevent PNA Patient on PCA

## 2017-09-03 NOTE — Op Note (Signed)
Operative Report:  PRE-OP DIAGNOSIS: COMPLEX ENDOMETRIAL HYPERPLASIA POLYP   POST-OP DIAGNOSIS: COMPLEX ENDOMETRIAL HYPERPLASIA POLYP   PROCEDURE: Procedure(s): HYSTERECTOMY TOTAL LAPAROSCOPIC BSO CYSTOSCOPY  SURGEON: Barnett Applebaum, MD, FACOG  ASSISTANT: Dr Glennon Mac   ANESTHESIA: General endotracheal anesthesia  ESTIMATED BLOOD LOSS: 200   SPECIMENS: Uterus, Tubes, Ovaries.  COMPLICATIONS: None  DISPOSITION: stable to PACU  FINDINGS: Intraabdominal adhesions were noted. Particular adhesions all around the uterus and adnexa.  Tubes edematous.  Ovaries atrophic.  PROCEDURE:  The patient was taken to the OR where anesthesia was administed. She was prepped and draped in the normal sterile fashion in the dorsal lithotomy position in the [place] stirrups. A time out was performed. A Graves speculum was inserted, the cervix was grasped with a single tooth tenaculum and the endometrial cavity was sounded. The cervix was progressively dilated to a size 18 [place] with [employer] dilators. A V-Care uterine manipulator was inserted in the usual fashion without incident. Gloves were changed and attention was turned to the abdomen.   An left upper quadrant transverse 38mm skin incision was made with the scalpel after local anesthesia applied to the skin and OG tube placed by anesthesia.  A Veress-step needle was inserted in the usual fashion and confirmed using the hanging drop technique. A pneumoperitoneum was obtained by insufflation of CO2 (opening pressure of 72mmHg) to 47mmHg. A diagnostic laparoscopy was performed yielding the previously described findings. Attention was turned to the left lower quadrant where after visualization of the inferior epigastric vessels a 40mm skin incision was made with the scalpel. A 5 mm laparoscopic port was inserted. The same procedure was repeated in the right lower quadrant with a 86mm trocar.  A suprapubic 5 mm port also was placed.  Attention was turned to the pelvis  where adhesions were carefully dissected around the uterus and adnexa to free uterus to a more normal anatomic position.  No bowel or colon penetration was encountered.  Attention was then turned to the left aspect of the uterus, where after visualization of the ureter, the round ligament was coagulated and transected using the 21mm Harmonic Scapel. The anterior and posterior leafs of the broad ligament were dissected off as the anterior one was coagulated and transected in a caudal direction towards the cuff of the uterine manipulator.  Attention was then turned to the left fallopian tube and ovary which was recognized by visualization of the fimbria. The infundibulopelvic ligament and its blood vessels were carefully coagulated and transected using the Harmonic scapel.  Attention was turned to the right aspect of the uterus where the same procedure was performed.  The vesicouterine reflection of the peritoneum was dissected with the harmonic scapel and the bladder flap was created bluntly.  The uterine vessels were coagulated and transected bilaterally using first bipolar cautery and then the harmonic scapel. A 360 degree, circumferential colpotomy was done to completely amputate the uterus with cervix and tubes. Once the specimen was amputated it was delivered through the vagina.   The colpotomy was repaired in a simple continuous fashion using a absorbable V-Lock suture with an endo-stitch device.  Vaginal exam confirms complete closure.  The cavity was copiously irrigated. A survey of the pelvic cavity revealed adequate hemostasis and no injury to bowel, bladder, or ureter.   A diagnostic cystoscopy was performed using saline distension of bladder with no lesions or injuries noted.  Bilateral urine flow from each ureteral orifice is visualized.  At this point the procedure was finalized. All the instruments  were removed from the patient's body. Gas was expelled and patient is leveled.  Fascia closed with  vicryl suture at the RLQ incision site.  Incisions are closed with skin adhesive.    Patient goes to recovery room in stable condition.  All sponge, instrument, and needle counts are correct x2.     Barnett Applebaum, MD, [facility], [facility] 09/03/2017  1:27 PM

## 2017-09-03 NOTE — H&P (Signed)
History and Physical Interval Note:  09/03/2017 9:45 AM  [name]  has presented today for surgery, with the diagnosis of COMPLEX ENDOMETRIAL HYPERPLASIA, POLYP, and POSTMENOPAUSAL BLEEDING.  The various methods of treatment have been discussed with the patient and family. After consideration of risks, benefits and other options for treatment, the patient has consented to  Procedure(s): HYSTERECTOMY TOTAL LAPAROSCOPIC with BSO and CYSTOSCOPY as a surgical intervention .  The patient's history has been reviewed, patient examined, no change in status, stable for surgery.  Pt has the following beta blocker history-  Not taking Beta Blocker.  I have reviewed the patient's chart and labs.  Questions were answered to the patient's satisfaction.    [name] [name]

## 2017-09-03 NOTE — Anesthesia Post-op Follow-up Note (Signed)
Anesthesia QCDR form completed.        

## 2017-09-04 ENCOUNTER — Encounter: Payer: Self-pay | Admitting: Obstetrics & Gynecology

## 2017-09-04 DIAGNOSIS — N840 Polyp of corpus uteri: Secondary | ICD-10-CM | POA: Diagnosis not present

## 2017-09-04 LAB — BASIC METABOLIC PANEL
Anion gap: 8 (ref 5–15)
BUN: 15 mg/dL (ref 6–20)
CO2: 31 mmol/L (ref 22–32)
Calcium: 8 mg/dL — ABNORMAL LOW (ref 8.9–10.3)
Chloride: 98 mmol/L — ABNORMAL LOW (ref 101–111)
Creatinine, Ser: 1.01 mg/dL — ABNORMAL HIGH (ref 0.44–1.00)
GFR calc Af Amer: >60 mL/min (ref >60)
GFR calc non Af Amer: 58 mL/min — ABNORMAL LOW (ref >60)
Glucose, Bld: 176 mg/dL — ABNORMAL HIGH (ref 65–99)
Potassium: 4.4 mmol/L (ref 3.5–5.1)
Sodium: 137 mmol/L (ref 135–145)

## 2017-09-04 LAB — GLUCOSE, CAPILLARY
Glucose-Capillary: 173 mg/dL — ABNORMAL HIGH (ref 65–99)
Glucose-Capillary: 154 mg/dL — ABNORMAL HIGH (ref 65–99)
Glucose-Capillary: 161 mg/dL — ABNORMAL HIGH (ref 65–99)
Glucose-Capillary: 134 mg/dL — ABNORMAL HIGH (ref 65–99)

## 2017-09-04 LAB — CBC
HCT: 29.8 % — ABNORMAL LOW (ref 35.0–47.0)
Hemoglobin: 9.6 g/dL — ABNORMAL LOW (ref 12.0–16.0)
MCH: 26.2 pg (ref 26.0–34.0)
MCHC: 32.3 g/dL (ref 32.0–36.0)
MCV: 81.2 fL (ref 80.0–100.0)
Platelets: 301 10*3/uL (ref 150–440)
RBC: 3.68 MIL/uL — ABNORMAL LOW (ref 3.80–5.20)
RDW: 15.9 % — ABNORMAL HIGH (ref 11.5–14.5)
WBC: 7.5 10*3/uL (ref 3.6–11.0)

## 2017-09-04 MED ORDER — FUROSEMIDE 10 MG/ML IJ SOLN
20.0000 mg | Freq: Once | INTRAMUSCULAR | Status: AC
  Administered 2017-09-04: 20 mg via INTRAVENOUS
  Filled 2017-09-04: qty 2

## 2017-09-04 MED ORDER — SODIUM CHLORIDE 0.9% FLUSH
3.0000 mL | Freq: Two times a day (BID) | INTRAVENOUS | Status: DC
  Administered 2017-09-04 – 2017-09-06 (×4): 3 mL via INTRAVENOUS

## 2017-09-04 NOTE — Progress Notes (Signed)
Orders to stop PCA.  16 ML left in syringe.  Wasted with Georgina Quint, RN

## 2017-09-04 NOTE — Progress Notes (Signed)
Trying to wean patient back to RA.  After d/c PCA patients 02 saturations were low on RA.  Currently on [facility] and will continue to monitor.

## 2017-09-04 NOTE — Care Management (Signed)
Patient 24 hours in observation status POD 1 total Lap hysterectomy. Barriers to discharge: transitioning to PO pain management medication, tolerate diet, ambulate, void. Anticipate discharge to home today. RNCM following.

## 2017-09-04 NOTE — Care Management (Signed)
RNCM notified by Corene Cornea with PT that patient would benefit from 3 in 1 commode and bariatric walker. Orders requested. Referral to [facility] with [facility]. To be delivered prior to discharge to this room.

## 2017-09-04 NOTE — Progress Notes (Signed)
1 Day Post-Op Procedure(s) (LRB): HYSTERECTOMY TOTAL LAPAROSCOPIC BSO (N/A) CYSTOSCOPY  Subjective: Patient reports improved pain (still mild pain abd especially RLQ inc area).  Pos appetite.  Pos flatus..    Objective: I have reviewed patient's vital signs, intake and output and medications.  Abd: Min T, ND Incision: clean, dry and intact Extr: no calf T, no edema  Assessment: s/p Procedure(s): HYSTERECTOMY TOTAL LAPAROSCOPIC BSO (N/A) CYSTOSCOPY: stable  Plan: 1. Analgesia, Percocet po as tolerated.  Discontinue PCA .  2. Ambulate.  Foley out. 3. Post op expectations discussed 4. Advance diet as tolerated.   5.  IS, deep breathing encouraged. 6. Consider d/c today if criteria is met (mostly, diet, void, ambulate, and pain well controlled w PO meds) 7. Appreciate medicine involvement.  Any changes to outpatient meds or management, let me know (or PCP).  Hoyt Koch 09/04/2017, 8:02 AM

## 2017-09-04 NOTE — Progress Notes (Signed)
Patient ID: MAELEY MATTON, female   DOB: 1951-07-10, 66 y.o.   MRN: [id number]  [facility] [employer] PROGRESS NOTE  CAYCEE WANAT HBZ:[id number] DOB: [birth year] DOA: 09/03/2017 PCP: Virginia Crews, MD  HPI/Subjective: Patient seen this morning. She was talking off the PCA drip. She was a little lethargic. Hard to even sit her up in the bed herself. Patient's pulse ox dropped down quite a bit.  Objective: Vitals:   09/04/17 1328 09/04/17 1331  BP:    Pulse:    Resp:    Temp:    SpO2: (!) 66% 96%    Filed Weights   09/03/17 1343 09/03/17 2143  Weight: (!) 164.7 kg (363 lb) (!) 161.2 kg (355 lb 4.8 oz)    ROS: Review of Systems  Constitutional: Negative for chills and fever.  Eyes: Negative for blurred vision.  Respiratory: Negative for cough and shortness of breath.   Cardiovascular: Negative for chest pain.  Gastrointestinal: Positive for abdominal pain. Negative for constipation, diarrhea, nausea and vomiting.  Genitourinary: Negative for dysuria.  Musculoskeletal: Negative for joint pain.  Neurological: Negative for dizziness and headaches.   Exam: Physical Exam  Constitutional: She appears lethargic.  HENT:  Nose: No mucosal edema.  Mouth/Throat: No oropharyngeal exudate or posterior oropharyngeal edema.  Eyes: Pupils are equal, round, and reactive to light. Conjunctivae, EOM and lids are normal.  Neck: No JVD present. Carotid bruit is not present. No edema present. No thyroid mass and no thyromegaly present.  Cardiovascular: S1 normal and S2 normal.  Exam reveals no gallop.   No murmur heard. Pulses:      Dorsalis pedis pulses are 2+ on the right side, and 2+ on the left side.  Respiratory: No respiratory distress. She has decreased breath sounds in the right lower field and the left lower field. She has no wheezes. She has no rhonchi. She has no rales.  GI: Soft. Bowel sounds are normal. There is tenderness.  Musculoskeletal:       Right ankle: She exhibits  swelling.       Left ankle: She exhibits swelling.  Lymphadenopathy:    She has no cervical adenopathy.  Neurological: She appears lethargic.  Skin: Skin is warm. No rash noted. Nails show no clubbing.  Psychiatric:  Little lethargic this morning      Data Reviewed: Basic Metabolic Panel:  Recent Labs Lab 09/04/17 0528  NA 137  K 4.4  CL 98*  CO2 31  GLUCOSE 176*  BUN 15  CREATININE 1.01*  CALCIUM 8.0*   CBC:  Recent Labs Lab 09/04/17 0528  WBC 7.5  HGB 9.6*  HCT 29.8*  MCV 81.2  PLT 301    CBG:  Recent Labs Lab 09/03/17 1356 09/03/17 1616 09/03/17 2220 09/04/17 0745 09/04/17 1143  GLUCAP 196* 234* 276* 173* 154*     Studies: Dg Chest 1 View  Result Date: 09/04/2017 CLINICAL DATA:  CHF, hx of HTN, coronary angioplasty, former smoker EXAM: CHEST 1 VIEW COMPARISON:  08/26/2017 FINDINGS: Cardiac silhouette is normal in size and configuration. No mediastinal or hilar masses. No evidence of adenopathy. Lungs are clear.  No pleural effusion or pneumothorax. Skeletal structures are grossly intact. IMPRESSION: No active disease. Electronically Signed   By: Lajean Manes M.D.   On: 09/04/2017 09:31    Scheduled Meds: . carvedilol  3.125 mg Oral BID WC  . docusate sodium  100 mg Oral BID  . furosemide  20 mg Oral Daily  . gabapentin  300 mg  Oral TID  . insulin aspart  0-5 Units Subcutaneous QHS  . insulin aspart  0-9 Units Subcutaneous TID WC  . losartan  50 mg Oral Daily  . pantoprazole (PROTONIX) IV  40 mg Intravenous Q24H   Continuous Infusions: . lactated ringers Stopped (09/03/17 2030)    Assessment/Plan:  1. Acute hypoxic respiratory failure. Could be with sedation with IV pain medications. Careful with pain medications at this point. Incentive spirometry and physical therapy evaluation. If no improvement in hypoxemia can consider a CT scan of the chest.  The patient likely has sleep apnea and/or obesity hypoventilation syndrome. Patient may need  oxygen at home. 2. Chronic systolic congestive heart failure. The patient does not seem to be in heart failure at this time but I did give a dose of IV Lasix this morning. Continue low-dose Coreg, losartan and oral Lasix. 3. Type 2 diabetes mellitus on sliding scale insulin at this point 4. Morbid obesity weight loss as needed. 5. Diabetic neuropathy on gabapentin 6. History of duodenal ulcer on Protonix  Code Status:     Code Status Orders        Start     Ordered   09/03/17 1506  Full code  Continuous     09/03/17 1505    Code Status History    Date Active Date Inactive Code Status Order ID Comments User Context   09/05/2015  6:41 AM 09/06/2015  5:10 PM Full Code [id number]  Sherri Rad, MD Inpatient     Disposition Plan: Would like to try to get off oxygen  Consultants:  Gynecology  Procedures:  Laparoscopic hysterectomy  Time spent: 25 minutes  [place], [facility]

## 2017-09-04 NOTE — Evaluation (Signed)
Physical Therapy Evaluation Patient Details Name: [name] [name] MRN: [id number] DOB: 1951-11-17 Today's Date: 09/04/2017   History of Present Illness  Pt underwent laprascopic hysterectomy and is POD#1. PT referral ordered due to lethargy and difficulty moving in bed. Pt has had persistent low O2 sats on room air and is requiring supplemental O2 at time of evaluation  Clinical Impression  Pt admitted with above diagnosis. Pt currently with functional limitations due to the deficits listed below (see PT Problem List). Pt is very lethargic during PT evaluation. SaO2 on room air is 78%. Pt denies history of prior blood clots or need for blood thinners. Pt placed on 2L/min O2 and eventually SaO2 improves to 93%. Pt requires mina+1 for bed mobility and CGA only for transfers. Pt able to take small, short steps from bed to recliner with rolling walker and CGA from therapist. SaO2 drops to 80% on room air with ambulation and doesn't recover until supplemental O2 applied. Pt is lethargic but no LE buckling noted with ambulation. She will need a BSC at discharge as well as a bariatric rolling walker but no PT after discharge. Pt will benefit from PT services during admission to address deficits in strength, balance, and mobility in order to return to full function at home.      Follow Up Recommendations No PT follow up    Equipment Recommendations  Rolling walker with 5" wheels;3in1 (PT)    Recommendations for Other Services       Precautions / Restrictions Precautions Precautions: Fall Restrictions Weight Bearing Restrictions: No      Mobility  Bed Mobility Overal bed mobility: Needs Assistance Bed Mobility: Supine to Sit     Supine to sit: Min assist     General bed mobility comments: Pt requires heavy cues for sequencing. Educated about log rolling to minimize abdominal pain. Heavy use of bed rails and HOB elevated  Transfers Overall transfer level: Needs assistance Equipment used:  Rolling walker (2 wheeled) Transfers: Sit to/from Stand Sit to Stand: Min guard         General transfer comment: Pt moves slowly and is guarded due to pain. However she is able to come to standing and is able to stabilize on rolling walker  Ambulation/Gait Ambulation/Gait assistance: Min guard Ambulation Distance (Feet): 3 Feet Assistive device: Rolling walker (2 wheeled) Gait Pattern/deviations: Decreased step length - right;Decreased step length - left Gait velocity: Decreased Gait velocity interpretation: <1.8 ft/sec, indicative of risk for recurrent falls General Gait Details: Pt able to take small, short steps from bed to recliner with rolling walker and CGA from therapist. SaO2 drops to 80% on room air with ambulation and doesn't recover until supplemental O2 applied. Pt is lethargic but no LE buckling noted with ambulation  Stairs            Wheelchair Mobility    Modified Rankin (Stroke Patients Only)       Balance Overall balance assessment: Needs assistance Sitting-balance support: No upper extremity supported Sitting balance-Leahy Scale: Good     Standing balance support: Bilateral upper extremity supported Standing balance-Leahy Scale: Fair                               Pertinent Vitals/Pain Pain Assessment: 0-10 Pain Score: 9  Pain Location: abdomen near incisions Pain Intervention(s): Premedicated before session    [place] expects to be discharged to:: Private residence Living Arrangements: Spouse/significant other Available  Help at Discharge: Available 24 hours/day Type of Home: House Home Access: Stairs to enter Entrance Stairs-Rails: Left Entrance Stairs-Number of Steps: 3 Home Layout: One level Home Equipment: Cane - single point;Shower seat;Grab bars - toilet;Grab bars - tub/shower (doesn't have walker, no BSC)      Prior Function Level of Independence: Independent         Comments: Requiring assist  with IADLs recently since not feeling well but generally independent with all ADLs/IADLs. No falls. Use of spc of frequently for balance typically in RUE     Hand Dominance   Dominant Hand: Left    Extremity/Trunk Assessment   Upper Extremity Assessment Upper Extremity Assessment: RUE deficits/detail RUE Deficits / Details: LUE strength grossly WFL. Pt with R shoulder flexion weakness which is chronic prior to admission    Lower Extremity Assessment Lower Extremity Assessment: Overall WFL for tasks assessed       Communication   Communication: No difficulties  Cognition Arousal/Alertness: Lethargic Behavior During Therapy: Flat affect Overall Cognitive Status: Within Functional Limits for tasks assessed                                        General Comments      Exercises     Assessment/Plan    PT Assessment Patient needs continued PT services  PT Problem List Decreased strength;Decreased activity tolerance;Decreased balance;Obesity;Pain;Decreased mobility       PT Treatment Interventions Gait training;DME instruction;Stair training;Functional mobility training;Therapeutic activities;Therapeutic exercise;Balance training;Neuromuscular re-education;Patient/family education    PT Goals (Current goals can be found in the Care Plan section)  Acute Rehab PT Goals Patient Stated Goal: Return to prior function at home PT Goal Formulation: With patient Time For Goal Achievement: 09/18/17 Potential to Achieve Goals: Good    Frequency Min 2X/week   Barriers to discharge        Co-evaluation               AM-PAC PT "6 Clicks" Daily Activity  Outcome Measure Difficulty turning over in bed (including adjusting bedclothes, sheets and blankets)?: Unable Difficulty moving from lying on back to sitting on the side of the bed? : Unable Difficulty sitting down on and standing up from a chair with arms (e.g., wheelchair, bedside commode, etc,.)?: A  Little Help needed moving to and from a bed to chair (including a wheelchair)?: A Little Help needed walking in hospital room?: A Little Help needed climbing 3-5 steps with a railing? : A Little 6 Click Score: 14    End of Session Equipment Utilized During Treatment: Gait belt;Oxygen Activity Tolerance: Patient limited by lethargy Patient left: in chair;with chair alarm set;with call bell/phone within reach Nurse Communication: Mobility status PT Visit Diagnosis: Unsteadiness on feet (R26.81);Other abnormalities of gait and mobility (R26.89);Muscle weakness (generalized) (M62.81)    Time: 3710-6269 PT Time Calculation (min) (ACUTE ONLY): 20 min   Charges:   PT Evaluation $PT Eval Moderate Complexity: 1 Mod     PT G Codes:   PT G-Codes **NOT FOR INPATIENT CLASS** Functional Assessment Tool Used: AM-PAC 6 Clicks Basic Mobility Functional Limitation: Mobility: Walking and moving around Mobility: Walking and Moving Around Current Status (S8546): At least 40 percent but less than 60 percent impaired, limited or restricted Mobility: Walking and Moving Around Goal Status [phone]): At least 1 percent but less than 20 percent impaired, limited or restricted    Lyndel Safe  Huprich PT, DPT    Huprich,Jason 09/04/2017, 11:39 AM

## 2017-09-04 NOTE — Progress Notes (Addendum)
Have been unsuccessful with weaning patient to room air.  She has ambulated in the room with walker.  Still having quite a bit of pain especially when moving.  She remains on [facility] at this time.  Continue trying to wean her.    Dr. Leslye Peer and Dr. Kenton Kingfisher aware of oxygenation issues.   Family (husband) is ok with patient staying an additional night to hopefully get her oxygenation under better control.

## 2017-09-04 NOTE — Anesthesia Postprocedure Evaluation (Signed)
Anesthesia Post Note  Patient: [name] [name]  Procedure(s) Performed: HYSTERECTOMY TOTAL LAPAROSCOPIC BSO (N/A ) CYSTOSCOPY  Patient location during evaluation: PACU Anesthesia Type: General Level of consciousness: awake and alert Pain management: pain level controlled Vital Signs Assessment: post-procedure vital signs reviewed and stable Respiratory status: spontaneous breathing, nonlabored ventilation, respiratory function stable and patient connected to nasal cannula oxygen Cardiovascular status: blood pressure returned to baseline and stable Postop Assessment: no apparent nausea or vomiting Anesthetic complications: no     Last Vitals:  Vitals:   09/04/17 0806 09/04/17 0827  BP:  119/60  Pulse:  89  Resp: 12   Temp:    SpO2: 92% 94%    Last Pain:  Vitals:   09/04/17 0806  TempSrc:   PainSc: 7                  Martha Clan

## 2017-09-04 NOTE — Progress Notes (Signed)
Patient having small amount of vaginal bleeding.  Sat on bedside commode for approx 6 minutes and tried to void but had no result.  Very small amount of stool passed.  Patient passing gas.

## 2017-09-04 NOTE — Consult Note (Signed)
[facility] Consultation Note  Patient ID: [name] [name], MRN: [id number], DOB/AGE: 04/27/1951 66 y.o. Admit date: 09/03/2017   Date of Consult: 09/04/2017 Primary Physician: Virginia Crews, MD Primary [place]  Chief Complaint: No chief complaint on file.  Reason for Consult: shortness of breath and edema  HPI: 66 y.o. female with the known previous LV systolic dysfunction and cardiomyopathy on appropriate medication management including carvedilol and furosemide and losartan. The patient has been stable with a recent stress test showing fixed anterior apical myocardial infarction and no evidence of myocardial ischemia. The patient was at lowest risk possible for cardiovascular complication with hysterectomy and had no evidence of complication with hysterectomy. Post hysterectomy the patient had some extra fluid retention lower extremity edema pulmonary edema consistent with acute on chronic systolic dysfunction heart failure. The patient responded very well for intra-Dennis Lasix and has now had less hypoxia and improvements of symptoms unable to ambulate at this time. This is no current evidence of chest pain or myocardial infarction. The patient is improved at this time  Past Medical History:  Diagnosis Date  . Arthritis    Rhumetoid arthritis  . BRCA negative 07/2017   MyRisk neg  . Cellulitis of both lower extremities    Chronic  . CHF (congestive heart failure) ([place])   . Coronary artery disease   . Diabetes mellitus without complication ([place])   . Family history of breast cancer 07/2017   MyRisk neg; IBIS=10.5%/riskscore=17%  . Family history of ovarian cancer   . Fibromyalgia   . Fibromyalgia affecting shoulder region   . Hyperlipidemia   . Hypertension   . Spinal stenosis       Surgical History:  Past Surgical History:  Procedure Laterality Date  . ABLATION SAPHENOUS VEIN W/ RFA    . ANAL FISSURECTOMY  01/2004  . CORONARY ANGIOPLASTY     . CYSTOSCOPY  09/03/2017   Procedure: CYSTOSCOPY;  Surgeon: Gae Dry, MD;  Location: [facility] ORS;  Service: Gynecology;;  . DILATION AND CURETTAGE OF UTERUS    . HERNIA REPAIR    . LAPAROSCOPIC HYSTERECTOMY N/A 09/03/2017   Procedure: HYSTERECTOMY TOTAL LAPAROSCOPIC BSO;  Surgeon: Gae Dry, MD;  Location: [facility] ORS;  Service: Gynecology;  Laterality: N/A;  . MEDIAL PARTIAL KNEE REPLACEMENT Left 01/06/2014  . UMBILICAL HERNIA REPAIR N/A 09/05/2015   Procedure: HERNIA REPAIR incarcerated UMBILICAL ADULT;  Surgeon: Sherri Rad, MD;  Location: [facility] ORS;  Service: General;  Laterality: N/A;  . Vascular Stent  11/04/2013     Home Meds: Prior to Admission medications   Medication Sig Start Date End Date Taking? Authorizing Provider  ALPRAZolam Duanne Moron) 0.5 MG tablet Take 1 tablet as needed 60 minutes before procedure 08/22/17  Yes Bacigalupo, Dionne Bucy, MD  aspirin 81 MG tablet Take 1 tablet (81 mg total) by mouth daily. 12/15/16  Yes Jearld Fenton, NP  carvedilol (COREG) 3.125 MG tablet Take 1 tablet (3.125 mg total) by mouth 2 (two) times daily with a meal. 07/18/17  Yes Bacigalupo, Dionne Bucy, MD  cetirizine (ZYRTEC) 10 MG tablet TAKE 1 TABLET (10 MG TOTAL) BY MOUTH DAILY. (NOT COVER*) 03/27/17  Yes Baity, Coralie Keens, NP  citalopram (CELEXA) 40 MG tablet Take 40 mg by mouth daily. 01/23/17  Yes [provider]  furosemide (LASIX) 20 MG tablet Take 1 tablet (20 mg total) by mouth daily. 07/18/17  Yes Bacigalupo, Dionne Bucy, MD  gabapentin (NEURONTIN) 300 MG capsule Take 1 capsule (300 mg total)  by mouth 3 (three) times daily. 08/13/17 08/13/18 Yes Gillis Santa, MD  glipiZIDE (GLUCOTROL) 5 MG tablet Take 1 tablet (5 mg total) by mouth 2 (two) times daily before a meal. 07/18/17  Yes Bacigalupo, Dionne Bucy, MD  losartan (COZAAR) 50 MG tablet Take 1 tablet (50 mg total) by mouth daily. 07/18/17  Yes Bacigalupo, Dionne Bucy, MD  oxyCODONE-acetaminophen (PERCOCET) 10-325 MG tablet Take 1 tablet by mouth  every 8 (eight) hours as needed. Patient taking differently: Take 1 tablet by mouth every 8 (eight) hours as needed for pain.  08/15/17  Yes Bacigalupo, Dionne Bucy, MD  tiZANidine (ZANAFLEX) 4 MG tablet Take 1 tablet (4 mg total) by mouth 2 (two) times daily as needed for muscle spasms. 08/13/17 09/12/17 Yes Gillis Santa, MD  glucose blood (ACCU-CHEK AVIVA PLUS) test strip To check blood sugar once a day.  DX: E11.9 12/15/16   Jearld Fenton, NP    Inpatient Medications:  . carvedilol  3.125 mg Oral BID WC  . docusate sodium  100 mg Oral BID  . furosemide  20 mg Oral Daily  . gabapentin  300 mg Oral TID  . insulin aspart  0-5 Units Subcutaneous QHS  . insulin aspart  0-9 Units Subcutaneous TID WC  . losartan  50 mg Oral Daily  . pantoprazole (PROTONIX) IV  40 mg Intravenous Q24H   . lactated ringers Stopped (09/03/17 2030)    Allergies:  Allergies  Allergen Reactions  . Latex Itching    itching  . Doxycycline Rash  . Sulfa Antibiotics Itching, Rash and Swelling    Social History   Social History  . Marital status: Married    Spouse name: Shanon Brow  . Number of children: 1  . Years of education: 9th Grade   Occupational History  . Disabled    Social History Main Topics  . Smoking status: Former Smoker    Packs/day: 1.00    Years: 29.00    Quit date: 12/03/1996  . Smokeless tobacco: Never Used  . Alcohol use No  . Drug use: No  . Sexual activity: Not Currently   Other Topics Concern  . Not on file   Social History Narrative  . No narrative on file     Family History  Problem Relation Age of Onset  . Hypertension Mother   . Cataracts Mother   . Thyroid disease Mother   . Dementia Mother   . COPD Father   . Dementia Father   . Cataracts Father   . Aneurysm Father        Abdominal & Brain  . Diabetes Father   . Melanoma Father   . Breast cancer Sister 64  . Fibromyalgia Sister   . Migraines Sister   . Hypertension Sister   . Breast cancer Sister 95  . COPD  Sister   . Ovarian cancer Sister 26  . Migraines Brother   . Heart attack Brother   . Colon cancer Paternal Uncle        6s  . Colon cancer Paternal Uncle        65s  . Uterine cancer Cousin   . Uterine cancer Cousin      Review of Systems Positive forShortness of breath and edema Negative for: General:  chills, fever, night sweats or weight changes.  Cardiovascular: PND orthopnea syncope dizziness  Dermatological skin lesions rashes Respiratory: Cough congestion Urologic: Frequent urination urination at night and hematuria Abdominal: negative for nausea, vomiting, diarrhea, bright red blood  per rectum, melena, or hematemesis Neurologic: negative for visual changes, and/or hearing changes  All other systems reviewed and are otherwise negative except as noted above.  Labs: No results for input(s): CKTOTAL, CKMB, TROPONINI in the last 72 hours. Lab Results  Component Value Date   WBC 7.5 09/04/2017   HGB 9.6 (L) 09/04/2017   HCT 29.8 (L) 09/04/2017   MCV 81.2 09/04/2017   PLT 301 09/04/2017    Recent Labs Lab 09/04/17 0528  NA 137  K 4.4  CL 98*  CO2 31  BUN 15  CREATININE 1.01*  CALCIUM 8.0*  GLUCOSE 176*   Lab Results  Component Value Date   CHOL 272 (H) 12/13/2016   HDL 44.80 12/13/2016   TRIG 254.0 (H) 12/13/2016   No results found for: DDIMER  Radiology/Studies:  Dg Chest 1 View  Result Date: 09/04/2017 CLINICAL DATA:  CHF, hx of HTN, coronary angioplasty, former smoker EXAM: CHEST 1 VIEW COMPARISON:  08/26/2017 FINDINGS: Cardiac silhouette is normal in size and configuration. No mediastinal or hilar masses. No evidence of adenopathy. Lungs are clear.  No pleural effusion or pneumothorax. Skeletal structures are grossly intact. IMPRESSION: No active disease. Electronically Signed   By: Lajean Manes M.D.   On: 09/04/2017 09:31   Dg Chest 2 View  Result Date: 08/27/2017 CLINICAL DATA:  Uterus surgery.  Preoperative evaluation . EXAM: CHEST  2 VIEW  COMPARISON:  09/04/2015. FINDINGS: Mediastinum hilar structures normal. Lungs are clear. No pleural effusion or pneumothorax. Heart size normal. Degenerative changes scoliosis thoracic spine. IMPRESSION: No acute cardiopulmonary disease . Electronically Signed   By: Marcello Moores  Register   On: 08/27/2017 08:36   Nm Myocar Multi W/spect Tamela Oddi Motion / Ef  Result Date: 08/30/2017  Blood pressure demonstrated a normal response to exercise.  There was no ST segment deviation noted during stress.  Defect 1: There is a medium defect of moderate severity present in the apical anterior location.  Findings consistent with prior myocardial infarction with peri-infarct ischemia.  This is an intermediate risk study.  The left ventricular ejection fraction is moderately decreased (30-44%).     DPO:EUMPNT sinus rhythm  Weights: Filed Weights   09/03/17 1343 09/03/17 2143  Weight: (!) 164.7 kg (363 lb) (!) 161.2 kg (355 lb 4.8 oz)     Physical Exam: Blood pressure (!) 126/57, pulse 96, temperature 98.6 F (37 C), temperature source Oral, resp. rate 20, height _0  (1.727 m), weight (!) 161.2 kg (355 lb 4.8 oz), SpO2 (!) 69 %. Body mass index is 54.02 kg/m. General: Well developed, well nourished, in no acute distress. Head eyes ears nose throat: Normocephalic, atraumatic, sclera non-icteric, no xanthomas, nares are without discharge. No apparent thyromegaly and/or mass  Lungs: Normal respiratory effort.  no wheezes, no rales, no rhonchi.  Heart: RRR with normal S1 S2. no murmur gallop, no rub, PMI is normal size and placement, carotid upstroke normal without bruit, jugular venous pressure is normal Abdomen: Soft, non-tender, non-distended with normoactive bowel sounds. No hepatomegaly. No rebound/guarding. No obvious abdominal masses. Abdominal aorta is normal size without bruit Extremities: No edema. no cyanosis, no clubbing, no ulcers  Peripheral : 2+ bilateral upper extremity pulses, 2+ bilateral  femoral pulses, 2+ bilateral dorsal pedal pulse Neuro: Alert and oriented. No facial asymmetry. No focal deficit. Moves all extremities spontaneously. Musculoskeletal: Normal muscle tone without kyphosis Psych:  Responds to questions appropriately with a normal affect.    Assessment: 66 year old female with known moderate dilated cardiomyopathy with acute  on chronic systolic dysfunction cart heart failure improved with appropriate intravenous Lasix with evidence of myocardial infarction  Plan: 1. Continue rehabilitation from hysterectomy without restriction 2. Continue supportive care status post surgery 3. No change in carvedilol losartan furosemide medication management for chronic systolic dysfunction heart failure 4. No further cardiac diagnostics necessary at this time 5. Continue ambulation and follow for improvements of edema and hypoxia with intravenous Lasix and the continue treatment until able to go home  Signed, Corey Skains M.D. [facility] 09/04/2017, 1:30 PM

## 2017-09-04 NOTE — Progress Notes (Signed)
Pt up in chair but still has desaturation of O2 when off of [place] O2 Fatigue, resting today Min pain Inc healing well Voids Not able to go home due to resp status Cont to allow medicine service to address these needs Post Op stable from hysterectomy

## 2017-09-05 ENCOUNTER — Encounter: Payer: Self-pay | Admitting: Student

## 2017-09-05 ENCOUNTER — Observation Stay: Payer: BLUE CROSS/BLUE SHIELD

## 2017-09-05 DIAGNOSIS — N840 Polyp of corpus uteri: Secondary | ICD-10-CM | POA: Diagnosis not present

## 2017-09-05 LAB — BASIC METABOLIC PANEL
Anion gap: 9 (ref 5–15)
BUN: 28 mg/dL — ABNORMAL HIGH (ref 6–20)
CO2: 31 mmol/L (ref 22–32)
Calcium: 7.5 mg/dL — ABNORMAL LOW (ref 8.9–10.3)
Chloride: 94 mmol/L — ABNORMAL LOW (ref 101–111)
Creatinine, Ser: 1.56 mg/dL — ABNORMAL HIGH (ref 0.44–1.00)
GFR calc Af Amer: 40 mL/min — ABNORMAL LOW (ref >60)
GFR calc non Af Amer: 34 mL/min — ABNORMAL LOW (ref >60)
Glucose, Bld: 156 mg/dL — ABNORMAL HIGH (ref 65–99)
Potassium: 4.3 mmol/L (ref 3.5–5.1)
Sodium: 134 mmol/L — ABNORMAL LOW (ref 135–145)

## 2017-09-05 LAB — GLUCOSE, CAPILLARY
Glucose-Capillary: 167 mg/dL — ABNORMAL HIGH (ref 65–99)
Glucose-Capillary: 151 mg/dL — ABNORMAL HIGH (ref 65–99)
Glucose-Capillary: 113 mg/dL — ABNORMAL HIGH (ref 65–99)
Glucose-Capillary: 123 mg/dL — ABNORMAL HIGH (ref 65–99)

## 2017-09-05 LAB — SURGICAL PATHOLOGY

## 2017-09-05 MED ORDER — IOPAMIDOL (ISOVUE-370) INJECTION 76%
75.0000 mL | Freq: Once | INTRAVENOUS | Status: AC | PRN
Start: 2017-09-05 — End: 2017-09-05
  Administered 2017-09-05: 75 mL via INTRAVENOUS

## 2017-09-05 MED ORDER — POLYETHYLENE GLYCOL 3350 17 G PO PACK
17.0000 g | PACK | Freq: Every day | ORAL | Status: DC
  Filled 2017-09-05 (×2): qty 1

## 2017-09-05 NOTE — Plan of Care (Signed)
Problem: Bowel/Gastric: Goal: Gastrointestinal status for postoperative course will improve Outcome: Not Progressing Patient has only consumed a minimal amount of food as of 15:00 today. Bowel sounds hypoactive. Remains on clear liquid diet per patient request. Will continue to monitor. [name] [name] [facility]

## 2017-09-05 NOTE — Progress Notes (Signed)
Pt doing better w mobility but still needs help to bedside commode. On 1L ncO2 Pathology discussed from surgery- no cancer! Will plan for discharge tomorrow with hopes for no need for home O2 therapy but will rely on Medicine MD to make that decision.

## 2017-09-05 NOTE — Care Management (Signed)
Patient placed in observation s/p laparoscopic hysterectomy and medicine was to follow for hypertension and diabetes.  Within the last 24 hours, patient has had oxygen stas documented 66-77%.  She does have history of CHF and has required one dose of IV lasix.  She at baseline is not on home oxygen.  Discussed the need to continue to wean during progression

## 2017-09-05 NOTE — Progress Notes (Signed)
PT Cancellation Note  Patient Details Name: [name] [name] MRN: [id number] DOB: [birth year]   Cancelled Treatment:    Reason Eval/Treat Not Completed: Patient declined, no reason specified. Treatment attempted; pt currently on [facility] and notes was just transferred to the commode. Offered to return when pt through; however, pt declines noting she cannot tolerate therapy today. Re attempt at a later date.    Larae Grooms, PTA 09/05/2017, 3:47 PM

## 2017-09-05 NOTE — Progress Notes (Signed)
[facility] [place] NAME: [name] [name]    MR#:  [id number]  DATE OF BIRTH:  1950-12-06  SUBJECTIVE:   Patient More awake this morning. Husband is at bedside. She is still requiring oxygen. She denies shortness of breath She has chronic lower extremity edema  REVIEW OF SYSTEMS:    Review of Systems  Constitutional: Negative for fever, chills weight loss HENT: Negative for ear pain, nosebleeds, congestion, facial swelling, rhinorrhea, neck pain, neck stiffness and ear discharge.   Respiratory: Negative for cough, shortness of breath, wheezing  Cardiovascular: Negative for chest pain, palpitations and ++ leg swelling.  Gastrointestinal: Negative for heartburn, Positive abdominal pain due to hysterectomy, vomiting, diarrhea +consitpation Genitourinary: Negative for dysuria, urgency, frequency, hematuria Musculoskeletal: Negative for back pain or joint pain Neurological: Negative for dizziness, seizures, syncope, focal weakness,  numbness and headaches.  Hematological: Does not bruise/bleed easily.  Psychiatric/Behavioral: Negative for hallucinations, confusion, dysphoric mood    Tolerating Diet: yes      DRUG ALLERGIES:   Allergies  Allergen Reactions  . Latex Itching    itching  . Doxycycline Rash  . Sulfa Antibiotics Itching, Rash and Swelling    VITALS:  Blood pressure (!) 117/57, pulse 75, temperature 97.8 F (36.6 C), temperature source Oral, resp. rate 16, height 5\' 8"  (1.727 m), weight (!) 160.7 kg (354 lb 4.8 oz), SpO2 96 %.  PHYSICAL EXAMINATION:  Constitutional: Morbidly obese female laying in bed without acute distress.  HENT: Normocephalic. [name] Oropharynx is clear and moist.  Eyes: Conjunctivae and EOM are normal. PERRLA, no scleral icterus.  Neck: Normal ROM. Neck supple. No JVD. No tracheal deviation. CVS: RRR, S1/S2 +, no murmurs, no gallops, no carotid bruit.  Pulmonary: Effort and breath sounds normal, no  stridor, rhonchi, wheezes, rales.  Abdominal: Soft. BS +,  no distension, tenderness, rebound or guarding.  Musculoskeletal: Normal range of motion. 2+ lower extremity edema and no tenderness.  Neuro: Alert. CN 2-12 grossly intact. No focal deficits. Skin: Skin is warm and dry. No rash noted. Psychiatric: Normal mood and affect.      LABORATORY PANEL:   CBC  Recent Labs Lab 09/04/17 0528  WBC 7.5  HGB 9.6*  HCT 29.8*  PLT 301   ------------------------------------------------------------------------------------------------------------------  Chemistries   Recent Labs Lab 09/04/17 0528  NA 137  K 4.4  CL 98*  CO2 31  GLUCOSE 176*  BUN 15  CREATININE 1.01*  CALCIUM 8.0*   ------------------------------------------------------------------------------------------------------------------  Cardiac Enzymes No results for input(s): TROPONINI in the last 168 hours. ------------------------------------------------------------------------------------------------------------------  RADIOLOGY:  Dg Chest 1 View  Result Date: 09/04/2017 CLINICAL DATA:  CHF, hx of HTN, coronary angioplasty, former smoker EXAM: CHEST 1 VIEW COMPARISON:  08/26/2017 FINDINGS: Cardiac silhouette is normal in size and configuration. No mediastinal or hilar masses. No evidence of adenopathy. Lungs are clear.  No pleural effusion or pneumothorax. Skeletal structures are grossly intact. IMPRESSION: No active disease. Electronically Signed   By: Lajean Manes M.D.   On: 09/04/2017 09:31     ASSESSMENT AND PLAN:   66 year old female with chronic systolic heart failure who is postoperative day #2 total hysterectomy And found to have persistent hypoxia.  1. Acute hypoxic respiratory failure possibly due to sedation. Chest x-ray does not show evidence of CHF or pneumonia. Will request CT chest to evaluate for PE Patient name need evaluation for underlying sleep apnea or obesity hypoventilation  syndrome. Wean O2 to room air if possible Continue ISS  2. Chronic systolic heart failure: Patient does not appear to be in exacerbation at this time She has chronic lower extremity edema She would benefit from Unna boots  3. Diabetes: Continue current management with insulin  4. Diabetic neuropathy: Continue gabapentin  5. Constipation: At scheduled laxatives 6. Abdominal pain: This is from surgery I will check KUB as patient is endorsing constipation as well. Management plans discussed with the patient and she is in agreement.  CODE STATUS: full  TOTAL TIME TAKING CARE OF THIS PATIENT: 29 minutes.     POSSIBLE D/C 1-2 days, DEPENDING ON CLINICAL CONDITION.   Shakthi Scipio M.D on 09/05/2017 at 9:23 AM  Between 7am to 6pm - Pager - [phone] After 6pm go to [email or web address] - password [email or web address] [facility]  Office  [phone]  CC: Primary care physician; Virginia Crews, MD  Note: This dictation was prepared with Dragon dictation along with smaller phrase technology. Any transcriptional errors that result from this process are unintentional.

## 2017-09-05 NOTE — Progress Notes (Signed)
2 Days Post-Op Procedure(s) (LRB): HYSTERECTOMY TOTAL LAPAROSCOPIC BSO (N/A) CYSTOSCOPY  Subjective: Patient reports min abd/incisional pain  Pos appetite.  Pos flatus..   Pt has min ambulation, with weakness an d fatigue reported  Objective: I have reviewed patient's vital signs, intake and output and medications. Abd: Min T, ND Incision: clean, dry and intact Extr: no calf T, no edema  Assessment: s/p Procedure(s): HYSTERECTOMY TOTAL LAPAROSCOPIC BSO (N/A) CYSTOSCOPY: stable  Plan: 1. Analgesia, Percocet po as tolerated. This will be her d/c pain medicine.  2. Ambulate.   3. Post op expectations discussed 4. Cont reg diet/ diabetic diet.   5.  IS, deep breathing encouraged. 6. Consider d/c today if criteria is met (has met from surgery perspective, just ongoing concerns with resp/cv) 7. Appreciate medicine involvement.  Any changes to outpatient meds or management, let me know (or PCP).  Robert Paul Harris 09/05/2017, 7:32 AM      

## 2017-09-06 DIAGNOSIS — N840 Polyp of corpus uteri: Secondary | ICD-10-CM | POA: Diagnosis not present

## 2017-09-06 LAB — BASIC METABOLIC PANEL
Anion gap: 8 (ref 5–15)
BUN: 18 mg/dL (ref 6–20)
CO2: 32 mmol/L (ref 22–32)
Calcium: 7.7 mg/dL — ABNORMAL LOW (ref 8.9–10.3)
Chloride: 98 mmol/L — ABNORMAL LOW (ref 101–111)
Creatinine, Ser: 0.68 mg/dL (ref 0.44–1.00)
GFR calc Af Amer: >60 mL/min (ref >60)
GFR calc non Af Amer: >60 mL/min (ref >60)
Glucose, Bld: 138 mg/dL — ABNORMAL HIGH (ref 65–99)
Potassium: 3.5 mmol/L (ref 3.5–5.1)
Sodium: 138 mmol/L (ref 135–145)

## 2017-09-06 LAB — GLUCOSE, CAPILLARY
Glucose-Capillary: 150 mg/dL — ABNORMAL HIGH (ref 65–99)
Glucose-Capillary: 189 mg/dL — ABNORMAL HIGH (ref 65–99)

## 2017-09-06 MED ORDER — OXYCODONE-ACETAMINOPHEN 5-325 MG PO TABS: 1.0000 | ORAL_TABLET | ORAL | 0 refills | Status: DC | PRN

## 2017-09-06 NOTE — Discharge Summary (Signed)
Gynecology Physician Postoperative Discharge Summary  Patient ID: [name] [name] MRN: [id number] DOB/AGE: 64-28-1952 64 y.o.  Admit Date: 09/03/2017 Discharge Date: 09/06/2017  Preoperative Diagnoses: Endometrial Hyperplasia  Procedures: Procedure(s) (LRB): HYSTERECTOMY TOTAL LAPAROSCOPIC BSO (N/A) CYSTOSCOPY  Significant Labs: CBC Latest Ref Rng & Units 09/04/2017 08/26/2017 07/18/2017  WBC 3.6 - 11.0 K/uL 7.5 6.0 7.2  Hemoglobin 12.0 - 16.0 g/dL 9.6(L) 11.8(L) 12.3  Hematocrit 35.0 - 47.0 % 29.8(L) 35.3 37.4  Platelets 150 - 440 K/uL 301 260 295    Hospital Course:  [name] [name] is a 66 y.o. G2P1  admitted for scheduled surgery.  She underwent the procedures as mentioned above, her operation was uncomplicated. For further details about surgery, please refer to the operative report. Patient had an uncomplicated postoperative course. By time of discharge on POD#3, her pain was controlled on oral pain medications; she was ambulating, voiding without difficulty, tolerating regular diet and passing flatus. She was deemed stable for discharge to home.   Discharge Exam: Blood pressure (!) 130/50, pulse 83, temperature 98.9 F (37.2 C), temperature source Oral, resp. rate 18, height 5\' 8"  (1.727 m), weight (!) 345 lb 4.8 oz (156.6 kg), SpO2 98 %. General appearance: alert and no distress  GI: soft, non-tender; bowel sounds normal; no masses, no organomegaly.  Incision: C/D/I, no erythema, no drainage noted Pelvic: scant blood on pad  Extremities: extremities normal, atraumatic, no cyanosis or edema and Homans sign is negative, no sign of DVT  Discharged Condition: Stable  Disposition: 01-Home or Self Care  Discharge Instructions    Call MD for:  persistant nausea and vomiting    Complete by:  As directed    Call MD for:  redness, tenderness, or signs of infection (pain, swelling, redness, odor or green/yellow discharge around incision site)    Complete by:  As directed    Call MD for:   severe uncontrolled pain    Complete by:  As directed    Call MD for:  temperature >100.4    Complete by:  As directed    Change dressing (specify)    Complete by:  As directed    Dressing change: remove any dressings tomorrow   Diet general    Complete by:  As directed    Discharge instructions    Complete by:  As directed    Resume activities according to discharge instruction sheets   Increase activity slowly    Complete by:  As directed      Allergies as of 09/06/2017      Reactions   Latex Itching   itching   Doxycycline Rash   Sulfa Antibiotics Itching, Rash, Swelling      Medication List    STOP taking these medications   oxyCODONE-acetaminophen 10-325 MG tablet Commonly known as:  PERCOCET Replaced by:  oxyCODONE-acetaminophen 5-325 MG tablet     TAKE these medications   ALPRAZolam 0.5 MG tablet Commonly known as:  XANAX Take 1 tablet as needed 60 minutes before procedure   aspirin 81 MG tablet Take 1 tablet (81 mg total) by mouth daily.   carvedilol 3.125 MG tablet Commonly known as:  COREG Take 1 tablet (3.125 mg total) by mouth 2 (two) times daily with a meal.   cetirizine 10 MG tablet Commonly known as:  ZYRTEC TAKE 1 TABLET (10 MG TOTAL) BY MOUTH DAILY. (NOT COVER*)   citalopram 40 MG tablet Commonly known as:  CELEXA Take 40 mg by mouth daily.   furosemide 20  MG tablet Commonly known as:  LASIX Take 1 tablet (20 mg total) by mouth daily.   gabapentin 300 MG capsule Commonly known as:  NEURONTIN Take 1 capsule (300 mg total) by mouth 3 (three) times daily.   glipiZIDE 5 MG tablet Commonly known as:  GLUCOTROL Take 1 tablet (5 mg total) by mouth 2 (two) times daily before a meal.   glucose blood test strip Commonly known as:  ACCU-CHEK AVIVA PLUS To check blood sugar once a day.  DX: E11.9   losartan 50 MG tablet Commonly known as:  COZAAR Take 1 tablet (50 mg total) by mouth daily.   oxyCODONE-acetaminophen 5-325 MG tablet Commonly  known as:  PERCOCET Take 1 tablet by mouth every 4 (four) hours as needed for moderate pain or severe pain. Replaces:  oxyCODONE-acetaminophen 10-325 MG tablet   tiZANidine 4 MG tablet Commonly known as:  ZANAFLEX Take 1 tablet (4 mg total) by mouth 2 (two) times daily as needed for muscle spasms.            Durable Medical Equipment        Start     Ordered   09/06/17 0902  For home use only DME oxygen  Once    Question Answer Comment  Mode or (Route) Nasal cannula   Liters per Minute 2   Frequency Continuous (stationary and portable oxygen unit needed)   Oxygen conserving device Yes   Oxygen delivery system Gas      09/06/17 0902   09/04/17 1125  For home use only DME Bedside commode  Once    Comments:  Bariatric size  Question:  Patient needs a bedside commode to treat with the following condition  Answer:  Difficulty walking   09/04/17 1124   09/04/17 1121  For home use only DME Walker rolling  Once    Comments:  Bariatric size  Question:  Patient needs a walker to treat with the following condition  Answer:  Difficulty walking   09/04/17 1121       Discharge Care Instructions        Start     Ordered   09/06/17 0000  Change dressing (specify)    Comments:  Dressing change: remove any dressings tomorrow   09/06/17 1331     Follow-up Information    Gae Dry, MD. Schedule an appointment as soon as possible for a visit in 1 week(s).   Specialty:  Obstetrics and Gynecology Contact information: [address] [place] [address] [phone]           Barnett Applebaum, MD

## 2017-09-06 NOTE — Progress Notes (Signed)
SATURATION QUALIFICATIONS: (This note is used to comply with regulatory documentation for home oxygen)  Patient Saturations on Room Air at Rest = 94%  Patient Saturations on Room Air while Ambulating = 97%  Patient Saturations on 0 Liters of oxygen while Ambulating = 97% (no O2 required).  Please briefly explain why patient needs home oxygen: X  Daron Offer

## 2017-09-06 NOTE — Progress Notes (Signed)
3 Days Post-Op Procedure(s) (LRB): HYSTERECTOMY TOTAL LAPAROSCOPIC BSO (N/A) CYSTOSCOPY  Subjective: Patient reports almost no abd/incisional pain  Pos appetite.  Pos flatus..   Sleeping well.  No O2 on this am, will see how she does  Objective: I have reviewed patient's vital signs, intake and output and medications. Abd: Min T, ND Incision: clean, dry and intact Extr: no calf T, no edema  Assessment: s/p Procedure(s): HYSTERECTOMY TOTAL LAPAROSCOPIC BSO (N/A) CYSTOSCOPY: stable  Plan: 1. Analgesia, Percocet po as tolerated. This will be her d/c pain medicine.  2. Discharge today, will await medicine team to confirm readiness 3. F/u self in 1-2 weeks as scheduled for post op  Hoyt Koch 09/06/2017, 7:27 AM

## 2017-09-06 NOTE — Discharge Instructions (Signed)

## 2017-09-06 NOTE — Progress Notes (Signed)
[facility] at [place] NAME: [name] [name]    MR#:  [id number]  DATE OF BIRTH:  [birth year]  SUBJECTIVE:   Patient tired this am Still NO BM but taking in clear liquid diet   REVIEW OF SYSTEMS:    Review of Systems  Constitutional: Negative for fever, chills weight loss HENT: Negative for ear pain, nosebleeds, congestion, facial swelling, rhinorrhea, neck pain, neck stiffness and ear discharge.   Respiratory: Negative for cough, shortness of breath, wheezing  Cardiovascular: Negative for chest pain, palpitations and ++ leg swelling.  Gastrointestinal: Negative for heartburn, Positive abdominal pain due to hysterectomy, vomiting, diarrhea +consitpation Genitourinary: Negative for dysuria, urgency, frequency, hematuria Musculoskeletal: Negative for back pain or joint pain Neurological: Negative for dizziness, seizures, syncope, focal weakness,  numbness and headaches.  Hematological: Does not bruise/bleed easily.  Psychiatric/Behavioral: Negative for hallucinations, confusion, dysphoric mood    Tolerating Diet: yes      DRUG ALLERGIES:   Allergies  Allergen Reactions  . Latex Itching    itching  . Doxycycline Rash  . Sulfa Antibiotics Itching, Rash and Swelling    VITALS:  Blood pressure (!) 152/66, pulse 80, temperature 98.1 F (36.7 C), temperature source Oral, resp. rate 18, height 5\' 8"  (1.727 m), weight (!) 156.6 kg (345 lb 4.8 oz), SpO2 100 %.  PHYSICAL EXAMINATION:  Constitutional: Morbidly obese female laying in bed without acute distress.  HENT: Normocephalic. [name] Oropharynx is clear and moist.  Eyes: Conjunctivae and EOM are normal. PERRLA, no scleral icterus.  Neck: Normal ROM. Neck supple. No JVD. No tracheal deviation. CVS: RRR, S1/S2 +, no murmurs, no gallops, no carotid bruit.  Pulmonary: Effort and breath sounds normal, no stridor, rhonchi, wheezes, rales.  Abdominal: Soft. BS +,  no distension, tenderness,  rebound or guarding.  Musculoskeletal: Normal range of motion. 2+ lower extremity edema and no tenderness.  Neuro: Alert. CN 2-12 grossly intact. No focal deficits. Skin: Skin is warm and dry. No rash noted. Psychiatric: Normal mood and affect.      LABORATORY PANEL:   CBC  Recent Labs Lab 09/04/17 0528  WBC 7.5  HGB 9.6*  HCT 29.8*  PLT 301   ------------------------------------------------------------------------------------------------------------------  Chemistries   Recent Labs Lab 09/06/17 0350  NA 138  K 3.5  CL 98*  CO2 32  GLUCOSE 138*  BUN 18  CREATININE 0.68  CALCIUM 7.7*   ------------------------------------------------------------------------------------------------------------------  Cardiac Enzymes No results for input(s): TROPONINI in the last 168 hours. ------------------------------------------------------------------------------------------------------------------  RADIOLOGY:  Dg Abd 1 View  Result Date: 09/05/2017 CLINICAL DATA:  Severe abdominal distention and constipation after recent hysterectomy. EXAM: ABDOMEN - 1 VIEW COMPARISON:  None. FINDINGS: Borderline gaseous distension of the transverse colon. No dilated small bowel. Air is seen in the distal sigmoid colon and rectum. Small amount of stool is noted in the right colon. No large volume pneumoperitoneum. No abnormal calcifications. No acute osseous abnormality. IMPRESSION: 1. Nonspecific bowel gas pattern with borderline gaseous distention of the transverse colon, which could reflect a degree of ileus. No dilated small bowel loops. Electronically Signed   By: Titus Dubin M.D.   On: 09/05/2017 09:42   Ct Angio Chest Pe W Or Wo Contrast  Result Date: 09/05/2017 CLINICAL DATA:  Acute on chronic heart failure.  Hypoxia. EXAM: CT ANGIOGRAPHY CHEST WITH CONTRAST TECHNIQUE: Multidetector CT imaging of the chest was performed using the standard protocol during bolus administration of  intravenous contrast. Multiplanar CT image reconstructions and MIPs  were obtained to evaluate the vascular anatomy. CONTRAST:  75 mL of Isovue 370 intravenously. COMPARISON:  Radiograph of September 03, 2017. FINDINGS: Cardiovascular: Satisfactory opacification of the pulmonary arteries to the segmental level. No evidence of pulmonary embolism. Mild cardiomegaly. No pericardial effusion. Atherosclerosis of thoracic aorta is noted without aneurysm or dissection. Mediastinum/Nodes: No enlarged mediastinal, hilar, or axillary lymph nodes. Thyroid gland, trachea, and esophagus demonstrate no significant findings. Lungs/Pleura: Lungs are clear. No pleural effusion or pneumothorax. Upper Abdomen: Solitary gallstone is noted. Musculoskeletal: No chest wall abnormality. No acute or significant osseous findings. Review of the MIP images confirms the above findings. IMPRESSION: No definite evidence of pulmonary embolus. Solitary gallstone. Aortic Atherosclerosis (ICD10-I70.0). Electronically Signed   By: Marijo Conception, M.D.   On: 09/05/2017 09:41     ASSESSMENT AND PLAN:   66 year old female with chronic systolic heart failure who is postoperative day #2 total hysterectomy And found to have persistent hypoxia.  1. Acute hypoxic respiratory failure possibly due to sedation. Chest x-ray does not show evidence of CHF or pneumonia. CT chest did not show pulmonary emboli, pneumonia, CHF or atelectasis. Patient may have underlying OSA and will require oxygen at discharge Oxygen order requested and case management consulted   2. Chronic systolic heart failure: Patient does not appear to be in exacerbation at this time She has chronic lower extremity edema   3. Diabetes: Continue current management with insulin  4. Diabetic neuropathy: Continue gabapentin  5. Constipation: Should have bowel movement prior to discharge.   6. Abdominal pain: This is from surgery KUB showed no evidence of obstruction   Patient  is okay to be discharged today. Case management should assist with home oxygen.   Management plans discussed with the patient and she is in agreement.  CODE STATUS: full  TOTAL TIME TAKING CARE OF THIS PATIENT: 39minutes.     POSSIBLE D/C 1-2 days, DEPENDING ON CLINICAL CONDITION.   Cambria Osten M.D on 09/06/2017 at 9:03 AM  Between 7am to 6pm - Pager - [phone] After 6pm go to [email or web address] - password [email or web address] [facility]  Office  [phone]  CC: Primary care physician; Virginia Crews, MD  Note: This dictation was prepared with Dragon dictation along with smaller phrase technology. Any transcriptional errors that result from this process are unintentional.

## 2017-09-06 NOTE — Care Management (Signed)
Discussed the need for home 02 assessment and advancing diet during progression.  Informed that patient would not allow for diet to be progressed.  OBGYN has documented patient ready for discharge today if cleared by medical team.  Spoke with primary nurse regarding diet, need for home 02 assessment and BM.

## 2017-09-10 ENCOUNTER — Ambulatory Visit
Payer: BLUE CROSS/BLUE SHIELD | Attending: Student in an Organized Health Care Education/Training Program | Admitting: Student in an Organized Health Care Education/Training Program

## 2017-09-16 ENCOUNTER — Encounter: Payer: Self-pay | Admitting: Obstetrics & Gynecology

## 2017-09-16 ENCOUNTER — Ambulatory Visit (INDEPENDENT_AMBULATORY_CARE_PROVIDER_SITE_OTHER): Payer: BLUE CROSS/BLUE SHIELD | Admitting: Obstetrics & Gynecology

## 2017-09-16 VITALS — BP 140/90 | HR 93 | Ht 68.0 in | Wt 346.0 lb

## 2017-09-16 DIAGNOSIS — N8502 Endometrial intraepithelial neoplasia [EIN]: Secondary | ICD-10-CM

## 2017-09-16 NOTE — Progress Notes (Signed)
  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  Subjective: Patient reports marked improvement in her preop symptoms. Eating a regular diet although low appetite without difficulty. Pain is controlled with current analgesics. Medications being used: same meds for RA are helping with any lingering post op pains.  Activity: sedentary. Patient reports vaginal sx's of Irregular bleeding  Objective: BP 140/90   Pulse 93   Ht 5\' 8"  (1.727 m)   Wt (!) 346 lb (156.9 kg)   LMP  (LMP Unknown) Comment: age 66  BMI 52.61 kg/m  Physical Exam  Constitutional: She is oriented to person, place, and time. She appears well-developed and well-nourished. No distress.  Cardiovascular: Normal rate.   Pulmonary/Chest: Effort normal.  Abdominal: Soft. She exhibits no distension. There is no tenderness.  Incision Healing Well   Musculoskeletal: Normal range of motion.  Neurological: She is alert and oriented to person, place, and time. No cranial nerve deficit.  Skin: Skin is warm and dry.  Psychiatric: She has a normal mood and affect.    Assessment: s/p :  total laparoscopic hysterectomy with bilateral salpingectomy stable  Plan: Patient has done well after surgery with no apparent complications.  I have discussed the post-operative course to date, and the expected progress moving forward.  The patient understands what complications to be concerned about.  I will see the patient in routine follow up, or sooner if needed.    Activity plan: No heavy lifting.  Hoyt Koch 09/16/2017, 10:56 AM

## 2017-09-19 ENCOUNTER — Encounter: Payer: Self-pay | Admitting: Family Medicine

## 2017-09-19 ENCOUNTER — Other Ambulatory Visit: Payer: Self-pay

## 2017-09-19 MED ORDER — OXYCODONE-ACETAMINOPHEN 5-325 MG PO TABS: 1.0000 | ORAL_TABLET | Freq: Four times a day (QID) | ORAL | 0 refills | Status: DC | PRN

## 2017-09-19 NOTE — Telephone Encounter (Signed)
Mrs. Collar advised.

## 2017-09-19 NOTE — Telephone Encounter (Signed)
This message came through my chart messaging: dr. b I have been calling for 3 days and the line is always bussie I need a refill of my oxycodone 10-325 birthdate 1951-10-23 my husband will come by to pickup im still recovering from my surgery please call and let me know when he can come by thank you [phone]

## 2017-09-19 NOTE — Telephone Encounter (Signed)
Ok to pick up one month supply.  Once she has recovered, she should f/u with pain management as previously scheduled.  Will leave on my desk signed.

## 2017-09-19 NOTE — Telephone Encounter (Signed)
Advised through [employer], RMA

## 2017-10-01 ENCOUNTER — Other Ambulatory Visit: Payer: Self-pay | Admitting: Family Medicine

## 2017-10-01 MED ORDER — FUROSEMIDE 20 MG PO TABS: 20.0000 mg | ORAL_TABLET | Freq: Every day | ORAL | 0 refills | Status: DC

## 2017-10-01 NOTE — Telephone Encounter (Signed)
[facility] pharmacy faxed a request for a 90-days supply for the following medication. Thanks CC  furosemide (LASIX) 20 MG tablet  >Take 1 tablet by mouth every day.

## 2017-10-14 ENCOUNTER — Encounter: Payer: Self-pay | Admitting: Student in an Organized Health Care Education/Training Program

## 2017-10-16 ENCOUNTER — Encounter: Payer: Self-pay | Admitting: Obstetrics & Gynecology

## 2017-10-16 ENCOUNTER — Ambulatory Visit (INDEPENDENT_AMBULATORY_CARE_PROVIDER_SITE_OTHER): Payer: BLUE CROSS/BLUE SHIELD | Admitting: Obstetrics & Gynecology

## 2017-10-16 VITALS — BP 150/90 | HR 93 | Ht 68.0 in | Wt 318.0 lb

## 2017-10-16 DIAGNOSIS — N8502 Endometrial intraepithelial neoplasia [EIN]: Secondary | ICD-10-CM

## 2017-10-16 NOTE — Progress Notes (Signed)
  Postoperative Follow-up Patient presents post op from [facility] BSO for endometrial hyperplasia w atypia, 6 weeks ago.  Subjective: Patient reports marked improvement in her preop symptoms. Eating a regular diet without difficulty.  Occas nausea.  The patient is not having any pain.  She has chronic pain but this also is minimal at this time (back, RA).  Occas hot flash.  Activity: normal activities of daily living. Patient reports vaginal sx's of No bleeding.  Objective: BP (!) 150/90   Pulse 93   Ht 5\' 8"  (1.727 m)   Wt (!) 318 lb (144.2 kg)   LMP  (LMP Unknown) Comment: age 66  BMI 48.35 kg/m  Physical Exam  Constitutional: She is oriented to person, place, and time. She appears well-developed and well-nourished. No distress.  Genitourinary: Rectum normal and vagina normal. Pelvic exam was performed with patient supine. There is no rash, tenderness or lesion on the right labia. There is no rash, tenderness or lesion on the left labia. No erythema or bleeding in the vagina. Right adnexum does not display mass and does not display tenderness. Left adnexum does not display mass and does not display tenderness.  Genitourinary Comments: Cervix and uterus absent. Vaginal cuff healing well.  Cardiovascular: Normal rate.  Pulmonary/Chest: Effort normal.  Abdominal: Soft. She exhibits no distension. There is no tenderness.  Incision healing well.  Musculoskeletal: Normal range of motion.  Neurological: She is alert and oriented to person, place, and time. No cranial nerve deficit.  Skin: Skin is warm and dry.  Psychiatric: She has a normal mood and affect.   Assessment: s/p :  [facility] BSO stable  Plan: Patient has done well after surgery with no apparent complications.  I have discussed the post-operative course to date, and the expected progress moving forward.  The patient understands what complications to be concerned about.  I will see the patient in routine follow up, or sooner if needed.     Activity plan: No restriction. Monitor hot flashes and nausea sx's  Hoyt Koch 10/16/2017, 11:46 AM

## 2017-10-21 ENCOUNTER — Telehealth: Payer: Self-pay | Admitting: Family Medicine

## 2017-10-21 MED ORDER — OXYCODONE-ACETAMINOPHEN 5-325 MG PO TABS: 1.0000 | ORAL_TABLET | Freq: Four times a day (QID) | ORAL | 0 refills | Status: DC | PRN

## 2017-10-21 NOTE — Telephone Encounter (Signed)
Pt contacted office for refill request on the following medications:  oxyCODONE-acetaminophen (PERCOCET) 5-325 MG tablet  Pt stated she is requesting one more refill to last until she can get in with pain management. Please advise. Thanks TNP

## 2017-10-21 NOTE — Telephone Encounter (Signed)
Please review. Thanks!  

## 2017-10-21 NOTE — Telephone Encounter (Signed)
Pt advised.

## 2017-10-21 NOTE — Telephone Encounter (Signed)
Patient can pick up Rx at her convenience.  Virginia Crews, MD, MPH [facility] 10/21/2017 4:00 PM

## 2017-11-04 ENCOUNTER — Other Ambulatory Visit: Payer: Self-pay | Admitting: Physician Assistant

## 2017-11-04 DIAGNOSIS — F329 Major depressive disorder, single episode, unspecified: Secondary | ICD-10-CM

## 2017-11-04 DIAGNOSIS — F32A Depression, unspecified: Secondary | ICD-10-CM

## 2017-11-14 ENCOUNTER — Other Ambulatory Visit: Payer: Self-pay

## 2017-11-14 ENCOUNTER — Ambulatory Visit
Payer: BLUE CROSS/BLUE SHIELD | Attending: Student in an Organized Health Care Education/Training Program | Admitting: Student in an Organized Health Care Education/Training Program

## 2017-11-14 ENCOUNTER — Encounter: Payer: Self-pay | Admitting: Student in an Organized Health Care Education/Training Program

## 2017-11-14 VITALS — BP 174/84 | HR 96 | Temp 98.4°F | Ht 68.0 in | Wt 323.0 lb

## 2017-11-14 DIAGNOSIS — I5022 Chronic systolic (congestive) heart failure: Secondary | ICD-10-CM | POA: Diagnosis not present

## 2017-11-14 DIAGNOSIS — Z87891 Personal history of nicotine dependence: Secondary | ICD-10-CM | POA: Insufficient documentation

## 2017-11-14 DIAGNOSIS — G8929 Other chronic pain: Secondary | ICD-10-CM | POA: Diagnosis not present

## 2017-11-14 DIAGNOSIS — Z9889 Other specified postprocedural states: Secondary | ICD-10-CM | POA: Insufficient documentation

## 2017-11-14 DIAGNOSIS — M47816 Spondylosis without myelopathy or radiculopathy, lumbar region: Secondary | ICD-10-CM | POA: Diagnosis not present

## 2017-11-14 DIAGNOSIS — G894 Chronic pain syndrome: Secondary | ICD-10-CM | POA: Insufficient documentation

## 2017-11-14 DIAGNOSIS — M5136 Other intervertebral disc degeneration, lumbar region: Secondary | ICD-10-CM | POA: Diagnosis not present

## 2017-11-14 DIAGNOSIS — Z9049 Acquired absence of other specified parts of digestive tract: Secondary | ICD-10-CM | POA: Diagnosis not present

## 2017-11-14 DIAGNOSIS — M25512 Pain in left shoulder: Secondary | ICD-10-CM | POA: Insufficient documentation

## 2017-11-14 DIAGNOSIS — M5442 Lumbago with sciatica, left side: Secondary | ICD-10-CM

## 2017-11-14 DIAGNOSIS — M48062 Spinal stenosis, lumbar region with neurogenic claudication: Secondary | ICD-10-CM | POA: Insufficient documentation

## 2017-11-14 DIAGNOSIS — M19012 Primary osteoarthritis, left shoulder: Secondary | ICD-10-CM

## 2017-11-14 DIAGNOSIS — Z79891 Long term (current) use of opiate analgesic: Secondary | ICD-10-CM | POA: Diagnosis not present

## 2017-11-14 DIAGNOSIS — M797 Fibromyalgia: Secondary | ICD-10-CM | POA: Diagnosis not present

## 2017-11-14 DIAGNOSIS — E119 Type 2 diabetes mellitus without complications: Secondary | ICD-10-CM | POA: Insufficient documentation

## 2017-11-14 DIAGNOSIS — I110 Hypertensive heart disease with heart failure: Secondary | ICD-10-CM | POA: Insufficient documentation

## 2017-11-14 DIAGNOSIS — M545 Low back pain: Secondary | ICD-10-CM | POA: Diagnosis present

## 2017-11-14 DIAGNOSIS — M5441 Lumbago with sciatica, right side: Secondary | ICD-10-CM | POA: Diagnosis not present

## 2017-11-14 DIAGNOSIS — Z79899 Other long term (current) drug therapy: Secondary | ICD-10-CM | POA: Insufficient documentation

## 2017-11-14 MED ORDER — GABAPENTIN 300 MG PO CAPS: 600.0000 mg | ORAL_CAPSULE | Freq: Three times a day (TID) | ORAL | 5 refills | Status: DC

## 2017-11-14 MED ORDER — OXYCODONE-ACETAMINOPHEN 10-325 MG PO TABS: 1.0000 | ORAL_TABLET | Freq: Three times a day (TID) | ORAL | 0 refills | Status: DC | PRN

## 2017-11-14 NOTE — Patient Instructions (Addendum)
1.  Sign opioid contract. 2.  Temporarily increase Percocet to 10 mg 3 times a day as needed for severe pain.  Quantity 90/month.  Fill date of December 17. 3.  Increase gabapentin to 600 mg 3 times a day.  New prescription provided 4.  Scheduled for left posterior shoulder joint injection with steroid. 5.  Patient to have imaging studies done.  Orders have been placed.  Patient can schedule time with radiology at her convenience.

## 2017-11-14 NOTE — Progress Notes (Signed)
Patient's Name: [name] [name]  MRN: [id number]  Referring Provider: Virginia Crews, MD  DOB: 1951-05-21  PCP: Virginia Crews, MD  DOS: 11/14/2017  Note by: Gillis Santa, MD  Service setting: Ambulatory outpatient  Specialty: Interventional Pain Management  Location: [facility] (AMB) Pain Management Facility    Patient type: Established   Primary Reason(s) for Visit: Encounter for evaluation before starting new chronic pain management plan of care (Level of risk: moderate) CC: Shoulder Pain (left shoulder) and Back Pain (low)  HPI  [name] [name] is a 66 y.o. year old, female patient, who comes today for a follow-up evaluation to review the test results and decide on a treatment plan. She has Allergic rhinitis; Chronic venous insufficiency; Atherosclerosis of coronary artery; Clinical depression; Diabetes mellitus, type 2 ([place]); Venous ulcer of ankle ([place]); Gastroduodenal ulcer; Rheumatoid arthritis ([place]); Acute non-ST elevation myocardial infarction (NSTEMI) ([place]); Chronic systolic heart failure ([place]); Pure hypercholesterolemia; HTN (hypertension); Lymphedema; Right shoulder pain; Health care maintenance; Atypical endometrial hyperplasia; Chronic pain disorder; and Complex endometrial hyperplasia with atypia on their problem list. Her primarily concern today is the Shoulder Pain (left shoulder) and Back Pain (low)  Pain Assessment: Location: Left Shoulder Radiating: radiates into arm Onset: More than a month ago Duration: Chronic pain Quality: Aching, Sharp, Stabbing Severity: 10-Worst pain ever/10 (self-reported pain score)  Note: Reported level is inconsistent with clinical observations. Clinically the patient looks like a 3/10 A 3/10 is viewed as "Moderate" and described as significantly interfering with activities of daily living (ADL). It becomes difficult to feed, bathe, get dressed, get on and off the toilet or to perform personal hygiene functions. Difficult to get in and out of bed or a  chair without assistance. Very distracting. With effort, it can be ignored when deeply involved in activities.       When using our objective Pain Scale, levels between 6 and 10/10 are said to belong in an emergency room, as it progressively worsens from a 6/10, described as severely limiting, requiring emergency care not usually available at an outpatient pain management facility. At a 6/10 level, communication becomes difficult and requires great effort. Assistance to reach the emergency department may be required. Facial flushing and profuse sweating along with potentially dangerous increases in heart rate and blood pressure will be evident. Effect on ADL:   Timing: Constant Modifying factors: noth8ing  [name] [name] comes in today for a follow-up visit after her initial evaluation on 09/10/2017. Today we went over the results of her tests. These were explained in "Layman's terms". During today's appointment we went over my diagnostic impression, as well as the proposed treatment plan  Patient returns today after her initial appointment on August 13, 2017.  Since the patient's visit with me, she has had a hysterectomy performed for atypical endometrial hyperplasia.  This was done on September 03, 2017.  Patient states that she is still recovering from the surgery.  Today she is endorsing left shoulder pain that is very severe.  Furthermore the patient states that her husband has been diagnosed with prostate cancer that is metastatic.  She is trying to provide care for him which has been difficult under her current medication regimen especially in the context of recovering from her surgery..  She states that the 5 mg of Percocet is not effective for her pain.  Patient was previously on 10 mg 4 times daily as needed.  She was at this dose last time in May 2018.  She has tried to  reduce her intake to 5 mg 4 times daily but she states that this is worsened her ability to take care of her husband, perform  activities of daily living.  Otherwise the patient continues gabapentin 300 mg 3 times a day. She is requesting to return to 10 mg up to 4 times a day as needed.  In considering the treatment plan options, [name] [name] was reminded that I no longer take patients for medication management only. I asked her to let me know if she had no intention of taking advantage of the interventional therapies, so that we could make arrangements to provide this space to someone interested. I also made it clear that undergoing interventional therapies for the purpose of getting pain medications is very inappropriate on the part of a patient, and it will not be tolerated in this practice. This type of behavior would suggest true addiction and therefore it requires referral to an addiction specialist.   Further details on both, my assessment(s), as well as the proposed treatment plan, please see below.  Controlled Substance Pharmacotherapy Assessment REMS (Risk Evaluation and Mitigation Strategy)  Analgesic: Currently Percocet 5 mg 4 times daily as needed MME/day: 30 mg/day. Pill Count: None expected due to no prior prescriptions written by our practice. No notes on file Pharmacokinetics: Liberation and absorption (onset of action): WNL Distribution (time to peak effect): WNL Metabolism and excretion (duration of action): WNL         Pharmacodynamics: Desired effects: Analgesia: [name] reports <50% benefit. Functional ability: Patient reports that medication does help, but not nearly as much as she would like Clinically meaningful improvement in function (CMIF): Sustained CMIF goals met Perceived effectiveness: Described as relatively effective but with some room for improvement Undesirable effects: Side-effects or Adverse reactions: None reported Monitoring: [place] PMP: Online review of the past 57-monthperiod previously conducted. Not applicable at this point since we have not taken over the patient's  medication management yet. List of other Serum/Urine Drug Screening Test(s):  No results found for: AMPHSCRSER, BARBSCRSER, BENZOSCRSER, COCAINSCRSER, COCAINSCRNUR, PCPSCRSER, THCSCRSER, THCU, CANNABQUANT, [place] PNadine ENondaltonList of all UDS test(s) done:  Lab Results  Component Value Date   SUMMARY FINAL 08/13/2017   Last UDS on record: Summary  Date Value Ref Range Status  08/13/2017 FINAL  Final    Comment:    ==================================================================== TOXASSURE COMP DRUG ANALYSIS,UR ==================================================================== Test                             Result       Flag       Units Drug Present and Declared for Prescription Verification   Oxycodone                      2498         EXPECTED   ng/mg creat   Oxymorphone                    160          EXPECTED   ng/mg creat   Noroxycodone                   3393         EXPECTED   ng/mg creat    Sources of oxycodone include scheduled prescription medications.    Oxymorphone and noroxycodone are expected metabolites of    oxycodone. Oxymorphone is also available as  a scheduled    prescription medication.   Gabapentin                     PRESENT      EXPECTED   Citalopram                     PRESENT      EXPECTED   Desmethylcitalopram            PRESENT      EXPECTED    Desmethylcitalopram is an expected metabolite of citalopram or    the enantiomeric form, escitalopram.   Acetaminophen                  PRESENT      EXPECTED Drug Absent but Declared for Prescription Verification   Tizanidine                     Not Detected UNEXPECTED    Tizanidine, as indicated in the declared medication list, is not    always detected even when used as directed.   Salicylate                     Not Detected UNEXPECTED    Aspirin, as indicated in the declared medication list, is not    always detected even when used as  directed. ==================================================================== Test                      Result    Flag   Units      Ref Range   Creatinine              94               mg/dL      >=20 ==================================================================== Declared Medications:  The flagging and interpretation on this report are based on the  following declared medications.  Unexpected results may arise from  inaccuracies in the declared medications.  **Note: The testing scope of this panel includes these medications:  Citalopram  Gabapentin  Oxycodone (Oxycodone Acetaminophen)  **Note: The testing scope of this panel does not include small to  moderate amounts of these reported medications:  Acetaminophen (Oxycodone Acetaminophen)  Aspirin (Aspirin 81)  Tizanidine  **Note: The testing scope of this panel does not include following  reported medications:  Carvedilol  Cetirizine  Furosemide  Glipizide  Losartan (Losartan Potassium)  Meloxicam  Prednisone ==================================================================== For clinical consultation, please call [phone]. ====================================================================    UDS interpretation: No unexpected findings.          Medication Assessment Form: Patient introduced to form today Treatment compliance: Treatment may start today if patient agrees with proposed plan. Evaluation of compliance is not applicable at this point Risk Assessment Profile: Aberrant behavior: See initial evaluations. None observed or detected today Comorbid factors increasing risk of overdose: See initial evaluation. No additional risks detected today Medical Psychology Evaluation: Please see scanned results in medical record. Opioid Risk Tool - 11/14/17 1113      Family History of Substance Abuse   Alcohol  Negative    Illegal Drugs  Negative    Rx Drugs  Negative      Personal History of Substance Abuse    Alcohol  Negative    Illegal Drugs  Negative    Rx Drugs  Negative      Age   Age between 65-45 years   No  History of Preadolescent Sexual Abuse   History of Preadolescent Sexual Abuse  Negative or Female      Psychological Disease   Psychological Disease  Negative    Depression  Negative      Total Score   Opioid Risk Tool Scoring  0    Opioid Risk Interpretation  Low Risk      ORT Scoring interpretation table:  Score <3 = Low Risk for SUD  Score between 4-7 = Moderate Risk for SUD  Score >8 = High Risk for Opioid Abuse   Risk Mitigation Strategies:  Patient opioid safety counseling: Completed today. Counseling provided to patient as per "Patient Counseling Document". Document signed by patient, attesting to counseling and understanding Patient-Prescriber Agreement (PPA): Obtained today.  Controlled substance notification to other providers: Written and sent today.  Pharmacologic Plan: Today we may be taking over the patient's pharmacological regimen. See below           Will increase from Percocet 5 mg 4 times daily to Percocet 10 mg 3 times daily.  MME increased by approximately 5-10.  Laboratory Chemistry  Inflammation Markers (CRP: Acute Phase) (ESR: Chronic Phase) Lab Results  Component Value Date   LATICACIDVEN 1.4 09/05/2015                 Rheumatology Markers No results found for: RF, ANA, Therisa D[place], [facility]              Renal Function Markers Lab Results  Component Value Date   BUN 18 09/06/2017   CREATININE 0.68 09/06/2017   GFRAA >60 09/06/2017   GFRNONAA >60 09/06/2017                 Hepatic Function Markers Lab Results  Component Value Date   AST 18 07/18/2017   ALT 13 07/18/2017   ALBUMIN 4.0 07/18/2017   ALKPHOS 75 07/18/2017   LIPASE 20 (L) 09/04/2015                 Electrolytes Lab Results  Component Value Date   NA 138 09/06/2017   K 3.5 09/06/2017   CL 98 (L) 09/06/2017   CALCIUM 7.7 (L) 09/06/2017                  Neuropathy Markers Lab Results  Component Value Date   HGBA1C 8.4 (H) 07/18/2017   HIV Non Reactive 07/18/2017                 Bone Pathology Markers No results found for: VD25OH, [id number], [id number], [id number], 25OHVITD1, 25OHVITD2, 25OHVITD3, TESTOFREE, TESTOSTERONE               Coagulation Parameters Lab Results  Component Value Date   INR 0.97 08/26/2017   LABPROT 12.8 08/26/2017   APTT 24 08/26/2017   PLT 301 09/04/2017                 Cardiovascular Markers Lab Results  Component Value Date   CKTOTAL 62 11/04/2013   CKMB 1.2 11/04/2013   TROPONINI 1.00 (H) 09/05/2015   HGB 9.6 (L) 09/04/2017   HCT 29.8 (L) 09/04/2017                 CA Markers No results found for: CEA, CA125, LABCA2               Note: Lab results reviewed.  Meds   Current Outpatient Medications:  .  cetirizine (ZYRTEC) 10 MG tablet, TAKE 1 TABLET (  10 MG TOTAL) BY MOUTH DAILY. (NOT COVER*), Disp: 30 tablet, Rfl: 2 .  citalopram (CELEXA) 40 MG tablet, Take 40 mg by mouth daily., Disp: , Rfl: 3 .  furosemide (LASIX) 20 MG tablet, Take 1 tablet (20 mg total) by mouth daily., Disp: 90 tablet, Rfl: 0 .  gabapentin (NEURONTIN) 300 MG capsule, Take 2 capsules (600 mg total) by mouth 3 (three) times daily., Disp: 180 capsule, Rfl: 5 .  glipiZIDE (GLUCOTROL) 5 MG tablet, Take 1 tablet (5 mg total) by mouth 2 (two) times daily before a meal., Disp: 60 tablet, Rfl: 2 .  glucose blood (ACCU-CHEK AVIVA PLUS) test strip, To check blood sugar once a day.  DX: E11.9, Disp: 100 each, Rfl: 1 .  methotrexate (RHEUMATREX) 2.5 MG tablet, Take by mouth., Disp: , Rfl:  .  oxyCODONE-acetaminophen (PERCOCET) 10-325 MG tablet, Take 1 tablet by mouth every 8 (eight) hours as needed for pain. For chronic pain To fill on or after November 18, 2017., Disp: 90 tablet, Rfl: 0  ROS  Constitutional: Denies any fever or chills Gastrointestinal: No reported hemesis, hematochezia, vomiting, or acute GI  distress Musculoskeletal: Denies any acute onset joint swelling, redness, loss of ROM, or weakness Neurological: No reported episodes of acute onset apraxia, aphasia, dysarthria, agnosia, amnesia, paralysis, loss of coordination, or loss of consciousness  Allergies  Ms. Leinweber is allergic to latex; doxycycline; and sulfa antibiotics.  [place]  Drug: Ms. Haros  reports that she does not use drugs. Alcohol:  reports that she does not drink alcohol. Tobacco:  reports that she quit smoking about 20 years ago. She has a 29.00 pack-year smoking history. she has never used smokeless tobacco. Medical:  has a past medical history of Arthritis, BRCA negative (07/2017), Cancer ([place]), Cellulitis of both lower extremities, CHF (congestive heart failure) ([place]), Coronary artery disease, Diabetes mellitus without complication ([place]), Family history of breast cancer (07/2017), Family history of ovarian cancer, Fibromyalgia, Fibromyalgia affecting shoulder region, Hyperlipidemia, Hypertension, and Spinal stenosis. Surgical: Ms. Gunkel  has a past surgical history that includes Medial partial knee replacement (Left, 01/06/2014); Vascular Stent (11/04/2013); Anal fissurectomy (01/2004); Hernia repair; Umbilical hernia repair (N/A, 09/05/2015); Ablation saphenous vein w/ RFA; Coronary angioplasty; Dilation and curettage of uterus; Laparoscopic hysterectomy (N/A, 09/03/2017); and Cystoscopy (09/03/2017). Family: family history includes Aneurysm in her father; Breast cancer (age of onset: 58) in her sister; Breast cancer (age of onset: 55) in her sister; COPD in her father and sister; Cataracts in her father and mother; Colon cancer in her paternal uncle and paternal uncle; Dementia in her father and mother; Diabetes in her father; Fibromyalgia in her sister; Heart attack in her brother; Hypertension in her mother and sister; Melanoma in her father; Migraines in her brother and sister; Ovarian cancer (age of onset: 27) in her  sister; Thyroid disease in her mother; Uterine cancer in her cousin and cousin.  Constitutional Exam  General appearance: morbidly obese and Well nourished, well developed, and well hydrated. In no apparent acute distress Vitals:   11/14/17 1108 11/14/17 1111  BP: (!) 174/84   Pulse: 96   Temp: 98.4 F (36.9 C)   TempSrc: Oral   Weight:  (!) 323 lb (146.5 kg)  Height:  '5\' 8"'  (1.727 m)   BMI Assessment: Estimated body mass index is 49.11 kg/m as calculated from the following:   Height as of this encounter: '5\' 8"'  (1.727 m).   Weight as of this encounter: 323 lb (146.5 kg).  BMI interpretation table:  BMI level Category Range association with higher incidence of chronic pain  <18 kg/m2 Underweight   18.5-24.9 kg/m2 Ideal body weight   25-29.9 kg/m2 Overweight Increased incidence by 20%  30-34.9 kg/m2 Obese (Class I) Increased incidence by 68%  35-39.9 kg/m2 Severe obesity (Class II) Increased incidence by 136%  >40 kg/m2 Extreme obesity (Class [name]) Increased incidence by 254%   BMI Readings from Last 4 Encounters:  11/14/17 49.11 kg/m  10/16/17 48.35 kg/m  09/16/17 52.61 kg/m  09/06/17 52.50 kg/m   Wt Readings from Last 4 Encounters:  11/14/17 (!) 323 lb (146.5 kg)  10/16/17 (!) 318 lb (144.2 kg)  09/16/17 (!) 346 lb (156.9 kg)  09/06/17 (!) 345 lb 4.8 oz (156.6 kg)  Psych/Mental status: Alert, oriented x 3 (person, place, & time)       Eyes: PERLA Respiratory: No evidence of acute respiratory distress  Cervical Spine Area Exam  Skin & Axial Inspection: No masses, redness, edema, swelling, or associated skin lesions Alignment: Symmetrical Functional ROM: Unrestricted ROM      Stability: No instability detected Muscle Tone/Strength: Functionally intact. No obvious neuro-muscular anomalies detected. Sensory (Neurological): Unimpaired Palpation: No palpable anomalies              Upper Extremity (UE) Exam    Side: Right upper extremity  Side: Left upper extremity   Skin & Extremity Inspection: Skin color, temperature, and hair growth are WNL. No peripheral edema or cyanosis. No masses, redness, swelling, asymmetry, or associated skin lesions. No contractures.  Skin & Extremity Inspection: Skin color, temperature, and hair growth are WNL. No peripheral edema or cyanosis. No masses, redness, swelling, asymmetry, or associated skin lesions. No contractures.  Functional ROM: Unrestricted ROM          Functional ROM: Unrestricted ROM          Muscle Tone/Strength: Functionally intact. No obvious neuro-muscular anomalies detected.  Muscle Tone/Strength: Functionally intact. No obvious neuro-muscular anomalies detected.  Sensory (Neurological): Unimpaired          Sensory (Neurological): Unimpaired          Palpation: No palpable anomalies              Palpation: No palpable anomalies              Specialized Test(s): Deferred         Specialized Test(s): Deferred          Thoracic Spine Area Exam  Skin & Axial Inspection: No masses, redness, or swelling Alignment: Symmetrical Functional ROM: Unrestricted ROM Stability: No instability detected Muscle Tone/Strength: Functionally intact. No obvious neuro-muscular anomalies detected. Sensory (Neurological): Unimpaired Muscle strength & Tone: No palpable anomalies  Lumbar Spine Area Exam  Skin & Axial Inspection: No masses, redness, or swelling Alignment: Symmetrical Functional ROM: Unrestricted ROM      Stability: No instability detected Muscle Tone/Strength: Functionally intact. No obvious neuro-muscular anomalies detected. Sensory (Neurological): Unimpaired Palpation: No palpable anomalies       Provocative Tests: Lumbar Hyperextension and rotation test: Positive bilaterally for facet joint pain. Lumbar Lateral bending test: Positive due to pain. Patrick's Maneuver: Positive for bilateral S-I arthralgia              Gait & Posture Assessment  Ambulation: Unassisted Gait: Relatively normal for age and  body habitus Posture: WNL   Lower Extremity Exam    Side: Right lower extremity  Side: Left lower extremity  Skin & Extremity Inspection: Skin color, temperature, and hair growth are  WNL. No peripheral edema or cyanosis. No masses, redness, swelling, asymmetry, or associated skin lesions. No contractures.  Skin & Extremity Inspection: Skin color, temperature, and hair growth are WNL. No peripheral edema or cyanosis. No masses, redness, swelling, asymmetry, or associated skin lesions. No contractures.  Functional ROM: Mechanically restricted ROM          Functional ROM: Mechanically restricted ROM          Muscle Tone/Strength: Functionally intact. No obvious neuro-muscular anomalies detected.  Muscle Tone/Strength: Functionally intact. No obvious neuro-muscular anomalies detected.  Sensory (Neurological): Unimpaired  Sensory (Neurological): Unimpaired  Palpation: No palpable anomalies  Palpation: No palpable anomalies   Assessment & Plan  Primary Diagnosis & Pertinent Problem List: The primary encounter diagnosis was Fibromyalgia. Diagnoses of Spinal stenosis, lumbar region, with neurogenic claudication, Lumbar degenerative disc disease, Lumbar facet arthropathy, Chronic bilateral low back pain with bilateral sciatica, Chronic pain syndrome, and Primary osteoarthritis of left shoulder were also pertinent to this visit.  Visit Diagnosis: 1. Fibromyalgia   2. Spinal stenosis, lumbar region, with neurogenic claudication   3. Lumbar degenerative disc disease   4. Lumbar facet arthropathy   5. Chronic bilateral low back pain with bilateral sciatica   6. Chronic pain syndrome   7. Primary osteoarthritis of left shoulder     General Recommendations: The pain condition that the patient suffers from is best treated with a multidisciplinary approach that involves an increase in physical activity to prevent de-conditioning and worsening of the pain cycle, as well as psychological counseling (formal  and/or informal) to address the co-morbid psychological affects of pain. Treatment will often involve judicious use of pain medications and interventional procedures to decrease the pain, allowing the patient to participate in the physical activity that will ultimately produce long-lasting pain reductions. The goal of the multidisciplinary approach is to return the patient to a higher level of overall function and to restore their ability to perform activities of daily living.  66 year old female with a history of morbid obesity, depression, type 2 diabetes, GI ulcer, hypertension who presents with diffuse body pain including arthralgias of multiple joints secondary to fibromyalgia and low back pain secondary to lumbar degenerative disc disease, lumbar spinal stenosis, and bilateral knee pain secondary to knee osteoarthritis and morbid obesity. Patient has been on chronic opioid therapy for more than 10 years.   Since the patient's visit with me, she has had a hysterectomy performed for atypical endometrial hyperplasia.  This was done on September 03, 2017.  Patient states that she is still recovering from the surgery.  Today she is endorsing left shoulder pain that is very severe.  Furthermore the patient states that her husband has been diagnosed with prostate cancer that is metastatic.  She is trying to provide care for him which has been difficult under her current medication regimen especially in the context of recovering from her surgery..  She states that the 5 mg of Percocet is not effective for her pain.  Patient was previously on 10 mg 4 times daily as needed.   Today I will have the patient sign an opioid agreement with our clinic.  Weaning the patient's total opioid dose down from 10 mg 4 times daily to 5 mg 4 times daily has not been helpful for her.  Patient endorses worsened disability since reducing her total opioid dose.  Furthermore she states that she is trying to provide assistance and care  for her husband as he is being treated for his prostate  cancer that is metastatic.  She states that the medications well effective do not last as long as they should.  At this time I will increase the patient's oxycodone to 10 mg up to 3 times a day as needed for severe pain.  I will also increase her gabapentin to 600 mg 3 times a day.  At her initial evaluation with me on September 11, I ordered x-rays of her lumbar spine, SI joints, bilateral hips, bilateral knees which the patient still has to complete.  Orders are still active.  I have encouraged her to complete these imaging studies so that we can better delineate a therapy plan which may include lumbar medial branch blocks, SI joint injections, genicular nerve blocks.  Plan: -Sign opioid agreement with our clinic today. Patient has seen pain psychology and is deemed low risk for substance abuse disorder.  UDS obtained at last visit and appropriate. -Prescription for Percocet 10 mg 3 times daily as needed -Increase gabapentin to 600 mg 3 times daily -Follow-up with radiology to obtain x-ray studies of lumbar spine, SI joints, hips, knees -Follow-up with me for left shoulder joint injection with steroid for shoulder osteoarthritis -Follow-up with me in 5 weeks for medication management.  Plan of Care  Pharmacotherapy (Medications Ordered): Meds ordered this encounter  Medications  . oxyCODONE-acetaminophen (PERCOCET) 10-325 MG tablet    Sig: Take 1 tablet by mouth every 8 (eight) hours as needed for pain. For chronic pain To fill on or after November 18, 2017.    Dispense:  90 tablet    Refill:  0    Do not place this medication, or any other prescription from our practice, on "Automatic Refill". Patient may have prescription filled one day early if pharmacy is closed on scheduled refill date. Do not fill until:  To last until:  . gabapentin (NEURONTIN) 300 MG capsule    Sig: Take 2 capsules (600 mg total) by mouth 3 (three) times daily.     Dispense:  180 capsule    Refill:  5   Lab-work, procedure(s), and/or referral(s): Orders Placed This Encounter  Procedures  . SHOULDER INJECTION   Opioid Analgesics:  Percocet 10 mg 3 times daily as needed.  Previously on 10 mg 4 times daily.  Membrane stabilizer:  Increase gabapentin to 600 mg 3 times daily.  Can try Lyrica or Topamax.  Muscle relaxant: Trial of tizanidine. Can consider baclofen, Robaxin  NSAID: History of GI ulcers so we will try to avoid  Other analgesic(s): 5% lidocaine, Voltaren gel, Cymbalta    Interventional management options: Ms. Kleeman was informed that there is no guarantee that she would be a candidate for interventional therapies. The decision will be based on the results of diagnostic studies, as well as Ms. Viswanathan risk profile.  Procedure(s) under consideration:  -lumbar facet blocks -SI joint injection -Genicular nerves block -TPIs    Time Note: Greater than 50% of the 40 minute(s) of face-to-face time spent with Ms. Gaultney, was spent in counseling/coordination of care regarding: Ms. Quaranta primary cause of pain, the treatment plan, treatment alternatives, the opioid analgesic risks and possible complications and realistic expectations.  Provider-requested follow-up: Return in about 5 weeks (around 12/16/2017) for Medication Management.    Future Appointments  Date Time Provider [place]  11/18/2017  9:15 AM Gillis Santa, MD [facility] None  12/17/2017 10:30 AM Gillis Santa, MD [facility] None    Primary Care Physician: Virginia Crews, MD Location: [facility] Note  by: Gillis Santa, M.D Date: 11/14/2017; Time: 2:03 PM  Patient Instructions  1.  Sign opioid contract. 2.  Temporarily increase Percocet to 10 mg 3 times a day as needed for severe pain.  Quantity 90/month.  Fill date of December 17. 3.  Increase gabapentin to 600 mg 3 times a day.  New prescription provided 4.  Scheduled for left  posterior shoulder joint injection with steroid. 5.  Patient to have imaging studies done.  Orders have been placed.  Patient can schedule time with radiology at her convenience.

## 2017-11-15 ENCOUNTER — Ambulatory Visit: Payer: Self-pay | Admitting: Family Medicine

## 2017-11-18 ENCOUNTER — Ambulatory Visit
Payer: BLUE CROSS/BLUE SHIELD | Attending: Student in an Organized Health Care Education/Training Program | Admitting: Student in an Organized Health Care Education/Training Program

## 2017-11-18 ENCOUNTER — Encounter: Payer: Self-pay | Admitting: Student in an Organized Health Care Education/Training Program

## 2017-11-18 ENCOUNTER — Other Ambulatory Visit: Payer: Self-pay

## 2017-11-18 VITALS — BP 151/74 | HR 71 | Temp 98.3°F | Resp 16 | Ht 68.5 in | Wt 324.0 lb

## 2017-11-18 DIAGNOSIS — Z91040 Latex allergy status: Secondary | ICD-10-CM | POA: Insufficient documentation

## 2017-11-18 DIAGNOSIS — Z90710 Acquired absence of both cervix and uterus: Secondary | ICD-10-CM | POA: Insufficient documentation

## 2017-11-18 DIAGNOSIS — F419 Anxiety disorder, unspecified: Secondary | ICD-10-CM | POA: Diagnosis not present

## 2017-11-18 DIAGNOSIS — M25512 Pain in left shoulder: Secondary | ICD-10-CM | POA: Insufficient documentation

## 2017-11-18 DIAGNOSIS — Z882 Allergy status to sulfonamides status: Secondary | ICD-10-CM | POA: Diagnosis not present

## 2017-11-18 DIAGNOSIS — Z79899 Other long term (current) drug therapy: Secondary | ICD-10-CM | POA: Insufficient documentation

## 2017-11-18 DIAGNOSIS — Z881 Allergy status to other antibiotic agents status: Secondary | ICD-10-CM | POA: Insufficient documentation

## 2017-11-18 DIAGNOSIS — Z9861 Coronary angioplasty status: Secondary | ICD-10-CM | POA: Insufficient documentation

## 2017-11-18 DIAGNOSIS — M25551 Pain in right hip: Secondary | ICD-10-CM | POA: Insufficient documentation

## 2017-11-18 DIAGNOSIS — Z7984 Long term (current) use of oral hypoglycemic drugs: Secondary | ICD-10-CM | POA: Diagnosis not present

## 2017-11-18 DIAGNOSIS — Z96652 Presence of left artificial knee joint: Secondary | ICD-10-CM | POA: Diagnosis not present

## 2017-11-18 DIAGNOSIS — Z79891 Long term (current) use of opiate analgesic: Secondary | ICD-10-CM | POA: Insufficient documentation

## 2017-11-18 DIAGNOSIS — M25552 Pain in left hip: Secondary | ICD-10-CM | POA: Diagnosis not present

## 2017-11-18 DIAGNOSIS — G894 Chronic pain syndrome: Secondary | ICD-10-CM | POA: Diagnosis not present

## 2017-11-18 DIAGNOSIS — M19012 Primary osteoarthritis, left shoulder: Secondary | ICD-10-CM | POA: Diagnosis not present

## 2017-11-18 MED ORDER — DEXAMETHASONE SODIUM PHOSPHATE 10 MG/ML IJ SOLN
10.0000 mg | Freq: Once | INTRAMUSCULAR | Status: DC
  Filled 2017-11-18: qty 1

## 2017-11-18 MED ORDER — ROPIVACAINE HCL 2 MG/ML IJ SOLN
10.0000 mL | Freq: Once | INTRAMUSCULAR | Status: DC
  Filled 2017-11-18: qty 10

## 2017-11-18 NOTE — Progress Notes (Signed)
Safety precautions to be maintained throughout the outpatient stay will include: orient to surroundings, keep bed in low position, maintain call bell within reach at all times, provide assistance with transfer out of bed and ambulation.  

## 2017-11-18 NOTE — Patient Instructions (Signed)
Trigger Point Injection Trigger points are areas where you have pain. A trigger point injection is a shot given in the trigger point to help relieve pain for a few days to a few months. Common places for trigger points include:  The neck.  The shoulders.  The upper back.  The lower back.  A trigger point injection will not cure long-lasting (chronic) pain permanently. These injections do not always work for every person, but for some people they can help to relieve pain for a few days to a few months. Tell a health care provider about:  Any allergies you have.  All medicines you are taking, including vitamins, herbs, eye drops, creams, and over-the-counter medicines.  Any problems you or family members have had with anesthetic medicines.  Any blood disorders you have.  Any surgeries you have had.  Any medical conditions you have. What are the risks? Generally, this is a safe procedure. However, problems may occur, including:  Infection.  Bleeding.  Allergic reaction to the injected medicine.  Irritation of the skin around the injection site.  What happens before the procedure?  Ask your health care provider about changing or stopping your regular medicines. This is especially important if you are taking diabetes medicines or blood thinners. What happens during the procedure?  Your health care provider will feel for trigger points. A marker may be used to circle the area for the injection.  The skin over the trigger point will be washed with a germ-killing (antiseptic) solution.  A thin needle is used for the shot. You may feel pain or a twitching feeling when the needle enters the trigger point.  A numbing solution may be injected into the trigger point. Sometimes a medicine to keep down swelling, redness, and warmth (inflammation) is also injected.  Your health care provider may move the needle around the area where the trigger point is located until the tightness  and twitching goes away.  After the injection, your health care provider may put gentle pressure over the injection site.  The injection site will be covered with a bandage (dressing). The procedure may vary among health care providers and hospitals. What happens after the procedure?  The dressing can be taken off in a few hours or as told by your health care provider.  You may feel sore and stiff for 1-2 days. This information is not intended to replace advice given to you by your health care provider. Make sure you discuss any questions you have with your health care provider. Document Released: 11/08/2011 Document Revised: 07/22/2016 Document Reviewed: 05/09/2015 Elsevier Interactive Patient Education  2018 [employer] Pain Management Discharge Instructions  General Discharge Instructions :  If you need to reach your doctor call: Monday-Friday 8:00 am - 4:00 pm at [phone] or toll free [phone].  After clinic hours [phone] to have operator reach doctor.  Bring all of your medication bottles to all your appointments in the pain clinic.  To cancel or reschedule your appointment with Pain Management please remember to call 24 hours in advance to avoid a fee.  Refer to the educational materials which you have been given on: General Risks, I had my Procedure. Discharge Instructions, Post Sedation.  Post Procedure Instructions:  The drugs you were given will stay in your system until tomorrow, so for the next 24 hours you should not drive, make any legal decisions or drink any alcoholic beverages.  You may eat anything you prefer, but it is better to start with   liquids then soups and crackers, and gradually work up to solid foods.  Please notify your doctor immediately if you have any unusual bleeding, trouble breathing or pain that is not related to your normal pain.  Depending on the type of procedure that was done, some parts of your body may feel week and/or numb.   This usually clears up by tonight or the next day.  Walk with the use of an assistive device or accompanied by an adult for the 24 hours.  You may use ice on the affected area for the first 24 hours.  Put ice in a Ziploc bag and cover with a towel and place against area 15 minutes on 15 minutes off.  You may switch to heat after 24 hours. 

## 2017-11-18 NOTE — Progress Notes (Signed)
Patient's Name: [name] [name]  MRN: [id number]  Referring Provider: Virginia Crews, MD  DOB: 06-Jan-1951  PCP: Virginia Crews, MD  DOS: 11/18/2017  Note by: Gillis Santa, MD  Service setting: Ambulatory outpatient  Specialty: Interventional Pain Management  Patient type: Established  Location: [facility] (AMB) Pain Management Facility  Visit type: Interventional Procedure   Primary Reason for Visit: Interventional Pain Management Treatment. CC: Shoulder Pain (left); Hip Pain (both); and Leg Pain (both)  Procedure:  Anesthesia, Analgesia, Anxiolysis:  Type: Diagnostic Glenohumeral Joint (shoulder) Injection Region: Posterolateral Shoulder Area Level: Shoulder Laterality: Left-Sided  Type: Local Anesthesia Local Anesthetic: Lidocaine 1% Route: Infiltration (Pelzer/IM) IV Access: Declined Sedation: Meaningful verbal contact was maintained at all times during the procedure  Indication(s): Analgesia and Anxiety   Indications: 1. Primary osteoarthritis of left shoulder   2. Chronic pain syndrome    Pain Score: Pre-procedure: 10-Worst pain ever/10 Post-procedure: 8 /10  Pre-op Assessment:  [name] [name] is a 66 y.o. (year old), female patient, seen today for interventional treatment. 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 [name] [name] has a current medication list which includes the following prescription(s): cetirizine, citalopram, furosemide, gabapentin, glipizide, glucose blood, methotrexate, and oxycodone-acetaminophen, and the following Facility-Administered Medications: dexamethasone and ropivacaine (pf) 2 mg/ml (0.2%). Her primarily concern today is the Shoulder  Pain (left); Hip Pain (both); and Leg Pain (both)  Initial Vital Signs: Blood pressure (!) 151/74, pulse 71, temperature 98.3 F (36.8 C), resp. rate 16, height 5' 8.5" (1.74 m), weight (!) 324 lb (147 kg), SpO2 97 %. BMI: Estimated body mass index is 48.55 kg/m as calculated from the following:   Height as of this encounter: 5' 8.5" (1.74 m).   Weight as of this encounter: 324 lb (147 kg).  Risk Assessment: Allergies: Reviewed. She is allergic to latex; doxycycline; and sulfa antibiotics.  Allergy Precautions: None required Coagulopathies: Reviewed. None identified.  Blood-thinner therapy: None at this time Active Infection(s): Reviewed. None identified. [name] [name] is afebrile  Site Confirmation: [name] [name] was asked to confirm the procedure and laterality before marking the site Procedure checklist: Completed Consent: Before the procedure and under the influence of no sedative(s), amnesic(s), or anxiolytics, the patient was informed of the treatment options, risks and possible complications. To fulfill our ethical and legal obligations, as recommended by the American Medical Association's Code of Ethics, I have informed the patient of my clinical impression; the nature and purpose of the treatment or procedure; the risks, benefits, and possible complications of the intervention; the alternatives, including doing nothing; the risk(s) and benefit(s) of the alternative treatment(s) or procedure(s); and the risk(s) and benefit(s) of doing nothing. The patient was provided information about the general risks and possible complications associated with the procedure. These may include, but are not limited to: failure to achieve desired goals, infection, bleeding, organ or nerve damage, allergic reactions, paralysis, and death. In addition, the patient was informed of those risks and complications associated to the procedure, such as failure to decrease pain; infection; bleeding; organ or nerve damage  with subsequent damage to sensory, motor, and/or autonomic systems, resulting in permanent pain, numbness, and/or weakness of one or several areas of the body; allergic reactions; (i.e.: anaphylactic reaction); and/or death. Furthermore, the patient was informed of  those risks and complications associated with the medications. These include, but are not limited to: allergic reactions (i.e.: anaphylactic or anaphylactoid reaction(s)); adrenal axis suppression; blood sugar elevation that in diabetics may result in ketoacidosis or comma; water retention that in patients with history of congestive heart failure may result in shortness of breath, pulmonary edema, and decompensation with resultant heart failure; weight gain; swelling or edema; medication-induced neural toxicity; particulate matter embolism and blood vessel occlusion with resultant organ, and/or nervous system infarction; and/or aseptic necrosis of one or more joints. Finally, the patient was informed that Medicine is not an exact science; therefore, there is also the possibility of unforeseen or unpredictable risks and/or possible complications that may result in a catastrophic outcome. The patient indicated having understood very clearly. We have given the patient no guarantees and we have made no promises. Enough time was given to the patient to ask questions, all of which were answered to the patient's satisfaction. [name] [name] has indicated that she wanted to continue with the procedure. Attestation: I, the ordering provider, attest that I have discussed with the patient the benefits, risks, side-effects, alternatives, likelihood of achieving goals, and potential problems during recovery for the procedure that I have provided informed consent. Date: 11/18/2017; Time: 2:43 PM  Pre-Procedure Preparation:  Monitoring: As per clinic protocol. Respiration, ETCO2, SpO2, BP, heart rate and rhythm monitor placed and checked for adequate function Safety  Precautions: Patient was assessed for positional comfort and pressure points before starting the procedure. Time-out: I initiated and conducted the "Time-out" before starting the procedure, as per protocol. The patient was asked to participate by confirming the accuracy of the "Time Out" information. Verification of the correct person, site, and procedure were performed and confirmed by me, the nursing staff, and the patient. "Time-out" conducted as per Joint Commission's Universal Protocol (UP.01.01.01). "Time-out" Date & Time: 11/18/2017; 1035 hrs.  Description of Procedure Process:   Position: Supine Target Area: Glenohumeral Joint (shoulder) Approach: Anterior approach. Area Prepped: Entire shoulder Area Prepping solution: ChloraPrep (2% chlorhexidine gluconate and 70% isopropyl alcohol) Safety Precautions: Aspiration looking for blood return was conducted prior to all injections. At no point did we inject any substances, as a needle was being advanced. No attempts were made at seeking any paresthesias. Safe injection practices and needle disposal techniques used. Medications properly checked for expiration dates. SDV (single dose vial) medications used. Description of the Procedure: Protocol guidelines were followed. The patient was placed in position over the procedure table. The target area was identified and the area prepped in the usual manner. Skin & deeper tissues infiltrated with local anesthetic. Appropriate amount of time allowed to pass for local anesthetics to take effect. The procedure needles were then advanced to the target area. Proper needle placement secured. Negative aspiration confirmed. Solution injected in intermittent fashion, asking for systemic symptoms every 0.5cc of injectate. The needles were then removed and the area cleansed, making sure to leave some of the prepping solution back to take advantage of its long term bactericidal properties. Vitals:   11/18/17 0929  11/18/17 1042  BP: (!) 158/89 (!) 151/74  Pulse: 88 71  Resp: 16 16  Temp: 98.3 F (36.8 C)   SpO2: 95% 97%  Weight: (!) 324 lb (147 kg)   Height: 5' 8.5" (1.74 m)     Start Time: 1035 hrs. End Time: 1039 hrs. Materials:  Needle(s) Type: Regular needle Gauge: 22G Length: 3.5-in Medication(s):  Please see chart orders for dosing details. 5 cc solution  made of 4 cc of 0.2% ropivacaine and 1 cc of Decadron 10 mg/cc.  5 cc injected into the posterior glenohumeral joint. Imaging Guidance (Non-Spinal):  Type of Imaging Technique: Fluoroscopy Guidance (Non-Spinal) Indication(s): Assistance in needle guidance and placement for procedures requiring needle placement in or near specific anatomical locations not easily accessible without such assistance. Exposure Time: Please see nurses notes. Contrast: None used. Fluoroscopic Guidance: I was personally present during the use of fluoroscopy. "Tunnel Vision Technique" used to obtain the best possible view of the target area. Parallax error corrected before commencing the procedure. "Direction-depth-direction" technique used to introduce the needle under continuous pulsed fluoroscopy. Once target was reached, antero-posterior, oblique, and lateral fluoroscopic projection used confirm needle placement in all planes. Images permanently stored in EMR. Interpretation: No contrast injected. I personally interpreted the imaging intraoperatively. Adequate needle placement confirmed in multiple planes. Permanent images saved into the patient's record.  Antibiotic Prophylaxis:  Indication(s): None identified Antibiotic given: None  Post-operative Assessment:  EBL: None Complications: No immediate post-treatment complications observed by team, or reported by patient. Note: The patient tolerated the entire procedure well. A repeat set of vitals were taken after the procedure and the patient was kept under observation following institutional policy, for this  type of procedure. Post-procedural neurological assessment was performed, showing return to baseline, prior to discharge. The patient was provided with post-procedure discharge instructions, including a section on how to identify potential problems. Should any problems arise concerning this procedure, the patient was given instructions to immediately contact us, at any time, without hesitation. In any case, we plan to contact the patient by telephone for a follow-up status report regarding this interventional procedure. Comments:  No additional relevant information.  Plan of Care    Medications ordered for procedure: Meds ordered this encounter  Medications  . ropivacaine (PF) 2 mg/mL (0.2%) (NAROPIN) injection 10 mL  . dexamethasone (DECADRON) injection 10 mg   Medications administered: [name] "[name]" had no medications administered during this visit.  See the medical record for exact dosing, route, and time of administration.  This SmartLink is deprecated. Use AVSMEDLIST instead to display the medication list for a patient. Disposition: Discharge home  Discharge Date & Time: 11/18/2017; 1050 hrs.   Physician-requested Follow-up: Return in about 4 weeks (around 12/16/2017) for Medication Management, Procedure. Future Appointments  Date Time Provider [place]  12/17/2017 10:30 AM Gillis Santa, MD [facility] None   Primary Care Physician: Virginia Crews, MD Location: [facility] Facility Note by: Gillis Santa, MD Date: 11/18/2017; Time: 2:45 PM  Disclaimer:  Medicine is not an exact science. The only guarantee in medicine is that nothing is guaranteed. It is important to note that the decision to proceed with this intervention was based on the information collected from the patient. The Data and conclusions were drawn from the patient's questionnaire, the interview, and the physical examination. Because the information was provided in large part  by the patient, it cannot be guaranteed that it has not been purposely or unconsciously manipulated. Every effort has been made to obtain as much relevant data as possible for this evaluation. It is important to note that the conclusions that lead to this procedure are derived in large part from the available data. Always take into account that the treatment will also be dependent on availability of resources and existing treatment guidelines, considered by other Pain Management Practitioners as being common knowledge and practice, at the time of the intervention. For Medico-Legal  purposes, it is also important to point out that variation in procedural techniques and pharmacological choices are the acceptable norm. The indications, contraindications, technique, and results of the above procedure should only be interpreted and judged by a Board-Certified Interventional Pain Specialist with extensive familiarity and expertise in the same exact procedure and technique.

## 2017-11-19 ENCOUNTER — Ambulatory Visit
Admission: RE | Admit: 2017-11-19 | Discharge: 2017-11-19 | Disposition: A | Discharge status: Home / Self Care | Payer: BLUE CROSS/BLUE SHIELD | Source: Ambulatory Visit | Attending: Student in an Organized Health Care Education/Training Program | Admitting: Student in an Organized Health Care Education/Training Program

## 2017-11-19 ENCOUNTER — Telehealth: Payer: Self-pay | Admitting: *Deleted

## 2017-11-19 DIAGNOSIS — M48062 Spinal stenosis, lumbar region with neurogenic claudication: Secondary | ICD-10-CM

## 2017-11-19 DIAGNOSIS — M25462 Effusion, left knee: Secondary | ICD-10-CM | POA: Insufficient documentation

## 2017-11-19 DIAGNOSIS — M13852 Other specified arthritis, left hip: Secondary | ICD-10-CM | POA: Insufficient documentation

## 2017-11-19 DIAGNOSIS — M170 Bilateral primary osteoarthritis of knee: Secondary | ICD-10-CM | POA: Diagnosis not present

## 2017-11-19 DIAGNOSIS — M47816 Spondylosis without myelopathy or radiculopathy, lumbar region: Secondary | ICD-10-CM | POA: Insufficient documentation

## 2017-11-19 DIAGNOSIS — M13851 Other specified arthritis, right hip: Secondary | ICD-10-CM | POA: Insufficient documentation

## 2017-11-19 DIAGNOSIS — M545 Low back pain: Secondary | ICD-10-CM | POA: Insufficient documentation

## 2017-11-19 NOTE — Telephone Encounter (Signed)
Attempted to call for post procedure follow-up. Unable to leave a message. 

## 2017-12-17 ENCOUNTER — Other Ambulatory Visit: Payer: Self-pay

## 2017-12-17 ENCOUNTER — Encounter: Payer: Self-pay | Admitting: Student in an Organized Health Care Education/Training Program

## 2017-12-17 ENCOUNTER — Ambulatory Visit
Payer: BLUE CROSS/BLUE SHIELD | Attending: Student in an Organized Health Care Education/Training Program | Admitting: Student in an Organized Health Care Education/Training Program

## 2017-12-17 VITALS — BP 155/70 | HR 84 | Temp 98.1°F | Resp 16 | Ht 68.0 in | Wt 320.0 lb

## 2017-12-17 DIAGNOSIS — M542 Cervicalgia: Secondary | ICD-10-CM | POA: Insufficient documentation

## 2017-12-17 DIAGNOSIS — Z9861 Coronary angioplasty status: Secondary | ICD-10-CM | POA: Diagnosis not present

## 2017-12-17 DIAGNOSIS — M48061 Spinal stenosis, lumbar region without neurogenic claudication: Secondary | ICD-10-CM | POA: Insufficient documentation

## 2017-12-17 DIAGNOSIS — M47898 Other spondylosis, sacral and sacrococcygeal region: Secondary | ICD-10-CM | POA: Insufficient documentation

## 2017-12-17 DIAGNOSIS — Z8349 Family history of other endocrine, nutritional and metabolic diseases: Secondary | ICD-10-CM | POA: Insufficient documentation

## 2017-12-17 DIAGNOSIS — I5022 Chronic systolic (congestive) heart failure: Secondary | ICD-10-CM | POA: Diagnosis not present

## 2017-12-17 DIAGNOSIS — M79602 Pain in left arm: Secondary | ICD-10-CM | POA: Diagnosis present

## 2017-12-17 DIAGNOSIS — M5136 Other intervertebral disc degeneration, lumbar region: Secondary | ICD-10-CM | POA: Diagnosis not present

## 2017-12-17 DIAGNOSIS — E7800 Pure hypercholesterolemia, unspecified: Secondary | ICD-10-CM | POA: Insufficient documentation

## 2017-12-17 DIAGNOSIS — Z833 Family history of diabetes mellitus: Secondary | ICD-10-CM | POA: Insufficient documentation

## 2017-12-17 DIAGNOSIS — Z87891 Personal history of nicotine dependence: Secondary | ICD-10-CM | POA: Diagnosis not present

## 2017-12-17 DIAGNOSIS — I2510 Atherosclerotic heart disease of native coronary artery without angina pectoris: Secondary | ICD-10-CM | POA: Insufficient documentation

## 2017-12-17 DIAGNOSIS — Z803 Family history of malignant neoplasm of breast: Secondary | ICD-10-CM | POA: Diagnosis not present

## 2017-12-17 DIAGNOSIS — Z8711 Personal history of peptic ulcer disease: Secondary | ICD-10-CM | POA: Diagnosis not present

## 2017-12-17 DIAGNOSIS — Z91040 Latex allergy status: Secondary | ICD-10-CM | POA: Insufficient documentation

## 2017-12-17 DIAGNOSIS — G894 Chronic pain syndrome: Secondary | ICD-10-CM | POA: Diagnosis not present

## 2017-12-17 DIAGNOSIS — Z6841 Body Mass Index (BMI) 40.0 and over, adult: Secondary | ICD-10-CM | POA: Insufficient documentation

## 2017-12-17 DIAGNOSIS — Z825 Family history of asthma and other chronic lower respiratory diseases: Secondary | ICD-10-CM | POA: Insufficient documentation

## 2017-12-17 DIAGNOSIS — M4698 Unspecified inflammatory spondylopathy, sacral and sacrococcygeal region: Secondary | ICD-10-CM

## 2017-12-17 DIAGNOSIS — M25511 Pain in right shoulder: Secondary | ICD-10-CM | POA: Insufficient documentation

## 2017-12-17 DIAGNOSIS — Z8249 Family history of ischemic heart disease and other diseases of the circulatory system: Secondary | ICD-10-CM | POA: Insufficient documentation

## 2017-12-17 DIAGNOSIS — Z8049 Family history of malignant neoplasm of other genital organs: Secondary | ICD-10-CM | POA: Insufficient documentation

## 2017-12-17 DIAGNOSIS — Z808 Family history of malignant neoplasm of other organs or systems: Secondary | ICD-10-CM | POA: Insufficient documentation

## 2017-12-17 DIAGNOSIS — Z96652 Presence of left artificial knee joint: Secondary | ICD-10-CM | POA: Diagnosis not present

## 2017-12-17 DIAGNOSIS — M47818 Spondylosis without myelopathy or radiculopathy, sacral and sacrococcygeal region: Secondary | ICD-10-CM

## 2017-12-17 DIAGNOSIS — I252 Old myocardial infarction: Secondary | ICD-10-CM | POA: Insufficient documentation

## 2017-12-17 DIAGNOSIS — Z90710 Acquired absence of both cervix and uterus: Secondary | ICD-10-CM | POA: Insufficient documentation

## 2017-12-17 DIAGNOSIS — J309 Allergic rhinitis, unspecified: Secondary | ICD-10-CM | POA: Insufficient documentation

## 2017-12-17 DIAGNOSIS — Z8041 Family history of malignant neoplasm of ovary: Secondary | ICD-10-CM | POA: Diagnosis not present

## 2017-12-17 DIAGNOSIS — I890 Lymphedema, not elsewhere classified: Secondary | ICD-10-CM | POA: Diagnosis not present

## 2017-12-17 DIAGNOSIS — Z7984 Long term (current) use of oral hypoglycemic drugs: Secondary | ICD-10-CM | POA: Insufficient documentation

## 2017-12-17 DIAGNOSIS — Z79899 Other long term (current) drug therapy: Secondary | ICD-10-CM | POA: Insufficient documentation

## 2017-12-17 DIAGNOSIS — I872 Venous insufficiency (chronic) (peripheral): Secondary | ICD-10-CM

## 2017-12-17 DIAGNOSIS — E119 Type 2 diabetes mellitus without complications: Secondary | ICD-10-CM | POA: Diagnosis not present

## 2017-12-17 DIAGNOSIS — Z8269 Family history of other diseases of the musculoskeletal system and connective tissue: Secondary | ICD-10-CM | POA: Insufficient documentation

## 2017-12-17 DIAGNOSIS — M069 Rheumatoid arthritis, unspecified: Secondary | ICD-10-CM | POA: Insufficient documentation

## 2017-12-17 DIAGNOSIS — M170 Bilateral primary osteoarthritis of knee: Secondary | ICD-10-CM | POA: Diagnosis not present

## 2017-12-17 DIAGNOSIS — Z881 Allergy status to other antibiotic agents status: Secondary | ICD-10-CM | POA: Insufficient documentation

## 2017-12-17 DIAGNOSIS — Z882 Allergy status to sulfonamides status: Secondary | ICD-10-CM | POA: Insufficient documentation

## 2017-12-17 DIAGNOSIS — Z79891 Long term (current) use of opiate analgesic: Secondary | ICD-10-CM | POA: Insufficient documentation

## 2017-12-17 DIAGNOSIS — F329 Major depressive disorder, single episode, unspecified: Secondary | ICD-10-CM | POA: Diagnosis not present

## 2017-12-17 DIAGNOSIS — M545 Low back pain: Secondary | ICD-10-CM | POA: Insufficient documentation

## 2017-12-17 DIAGNOSIS — I110 Hypertensive heart disease with heart failure: Secondary | ICD-10-CM | POA: Diagnosis not present

## 2017-12-17 DIAGNOSIS — M797 Fibromyalgia: Secondary | ICD-10-CM

## 2017-12-17 MED ORDER — MELOXICAM 15 MG PO TABS: 15.0000 mg | ORAL_TABLET | Freq: Every day | ORAL | 0 refills | Status: AC

## 2017-12-17 MED ORDER — OXYCODONE-ACETAMINOPHEN 10-325 MG PO TABS: 1.0000 | ORAL_TABLET | Freq: Three times a day (TID) | ORAL | 0 refills | Status: DC | PRN

## 2017-12-17 NOTE — Progress Notes (Addendum)
Patient's Name: [name] [name]  MRN: [id number]  Referring Provider: Virginia Crews, MD  DOB: 07-05-1951  PCP: Virginia Crews, MD  DOS: 12/17/2017  Note by: Gillis Santa, MD  Service setting: Ambulatory outpatient  Specialty: Interventional Pain Management  Location: [facility] (AMB) Pain Management Facility    Patient type: Established   Primary Reason(s) for Visit: Encounter for prescription drug management. (Level of risk: moderate)  CC: Arm Pain (left); Knee Pain (bilateral); Back Pain (lower); and Neck Pain (top of neck)  HPI  [name] [name] is a 67 y.o. year old, female patient, who comes today for a medication management evaluation. She has Allergic rhinitis; Chronic venous insufficiency; Atherosclerosis of coronary artery; Clinical depression; Diabetes mellitus, type 2 ([place]); Venous ulcer of ankle ([place]); Gastroduodenal ulcer; Rheumatoid arthritis ([place]); Acute non-ST elevation myocardial infarction (NSTEMI) ([place]); Chronic systolic heart failure ([place]); Pure hypercholesterolemia; HTN (hypertension); Lymphedema; Right shoulder pain; Health care maintenance; Atypical endometrial hyperplasia; Chronic pain disorder; and Complex endometrial hyperplasia with atypia on their problem list. Her primarily concern today is the Arm Pain (left); Knee Pain (bilateral); Back Pain (lower); and Neck Pain (top of neck)  Pain Assessment: Location: Left Arm Radiating: upper armsradiates rarely down arm to hand Onset: More than a month ago Duration: Chronic pain Quality: Aching, Constant, Throbbing, Tender(swollen) Severity: 10-Worst pain ever/10 (self-reported pain score)  Note: Reported level is inconsistent with clinical observations.                         When using our objective Pain Scale, levels between 6 and 10/10 are said to belong in an emergency room, as it progressively worsens from a 6/10, described as severely limiting, requiring emergency care not usually available at an outpatient pain  management facility. At a 6/10 level, communication becomes difficult and requires great effort. Assistance to reach the emergency department may be required. Facial flushing and profuse sweating along with potentially dangerous increases in heart rate and blood pressure will be evident. Effect on ADL: pt left handed, hard to lift, hard to put on clothes Timing: Constant Modifying factors: medications help pt function  [name] [name] was last scheduled for an appointment on 11/18/2017 for medication management. During today's appointment we reviewed Ms. Lansdale chronic pain status, as well as her outpatient medication regimen.  Patient presents today for follow-up.  X-rays were reviewed.  Overall patient states that she is at baseline.  Her husband is in the hospital in the ICU given his terminal cancer and renal failure she states that this is a big stressor for her especially since she is his primary caregiver.  She also states that the gabapentin has become less effective, current dose 600 mg 3 times daily.  Patient is continuing to endorse left shoulder pain and left arm swelling which is been chronic in nature for many years.  The patient  reports that she does not use drugs. Her body mass index is 48.66 kg/m.  Further details on both, my assessment(s), as well as the proposed treatment plan, please see below.  Controlled Substance Pharmacotherapy Assessment REMS (Risk Evaluation and Mitigation Strategy)  Analgesic: Percocet 10 mg 3 times daily as needed, quantity 90, last filled 11/18/2017. MME/day: 45 mg/day.  Ignatius Specking, RN  65/15/2019 11:09 AM  Sign at close encounter Nursing Pain Medication Assessment:  Safety precautions to be maintained throughout the outpatient stay will include: orient to surroundings, keep bed in low position, maintain call bell within reach  at all times, provide assistance with transfer out of bed and ambulation.  Medication Inspection Compliance: Pill count  conducted under aseptic conditions, in front of the patient. Neither the pills nor the bottle was removed from the patient's sight at any time. Once count was completed pills were immediately returned to the patient in their original bottle.  Medication: See above Pill/Patch Count: 7 of 90 pills remain Pill/Patch Appearance: Markings consistent with prescribed medication Bottle Appearance: Standard pharmacy container. Clearly labeled. Filled Date: 47 / 17 / 2018 Last Medication intake:  Today   Pharmacokinetics: Liberation and absorption (onset of action): WNL Distribution (time to peak effect): WNL Metabolism and excretion (duration of action): WNL         Pharmacodynamics: Desired effects: Analgesia: [name] [name] reports >50% benefit. Functional ability: Patient reports that medication allows her to accomplish basic ADLs Clinically meaningful improvement in function (CMIF): Sustained CMIF goals met Perceived effectiveness: Described as relatively effective, allowing for increase in activities of daily living (ADL) Undesirable effects: Side-effects or Adverse reactions: None reported Monitoring: [place] PMP: Online review of the past 37-monthperiod conducted. Compliant with practice rules and regulations Last UDS on record: Summary  Date Value Ref Range Status  08/13/2017 FINAL  Final    Comment:    ==================================================================== TOXASSURE COMP DRUG ANALYSIS,UR ==================================================================== Test                             Result       Flag       Units Drug Present and Declared for Prescription Verification   Oxycodone                      2498         EXPECTED   ng/mg creat   Oxymorphone                    160          EXPECTED   ng/mg creat   Noroxycodone                   3393         EXPECTED   ng/mg creat    Sources of oxycodone include scheduled prescription medications.    Oxymorphone and noroxycodone  are expected metabolites of    oxycodone. Oxymorphone is also available as a scheduled    prescription medication.   Gabapentin                     PRESENT      EXPECTED   Citalopram                     PRESENT      EXPECTED   Desmethylcitalopram            PRESENT      EXPECTED    Desmethylcitalopram is an expected metabolite of citalopram or    the enantiomeric form, escitalopram.   Acetaminophen                  PRESENT      EXPECTED Drug Absent but Declared for Prescription Verification   Tizanidine                     Not Detected UNEXPECTED    Tizanidine, as indicated in the declared medication list, is not    always  detected even when used as directed.   Salicylate                     Not Detected UNEXPECTED    Aspirin, as indicated in the declared medication list, is not    always detected even when used as directed. ==================================================================== Test                      Result    Flag   Units      Ref Range   Creatinine              94               mg/dL      >=20 ==================================================================== Declared Medications:  The flagging and interpretation on this report are based on the  following declared medications.  Unexpected results may arise from  inaccuracies in the declared medications.  **Note: The testing scope of this panel includes these medications:  Citalopram  Gabapentin  Oxycodone (Oxycodone Acetaminophen)  **Note: The testing scope of this panel does not include small to  moderate amounts of these reported medications:  Acetaminophen (Oxycodone Acetaminophen)  Aspirin (Aspirin 81)  Tizanidine  **Note: The testing scope of this panel does not include following  reported medications:  Carvedilol  Cetirizine  Furosemide  Glipizide  Losartan (Losartan Potassium)  Meloxicam  Prednisone ==================================================================== For clinical consultation,  please call [phone]. ====================================================================    UDS interpretation: Compliant          Medication Assessment Form: Reviewed. Patient indicates being compliant with therapy Treatment compliance: Compliant Risk Assessment Profile: Aberrant behavior: See prior evaluations. None observed or detected today Comorbid factors increasing risk of overdose: See prior notes. No additional risks detected today Risk of substance use disorder (SUD): Low Opioid Risk Tool - 12/17/17 1106      Family History of Substance Abuse   Alcohol  Negative    Illegal Drugs  Negative    Rx Drugs  Negative      Personal History of Substance Abuse   Alcohol  Negative    Illegal Drugs  Negative      Age   Age between 60-45 years   No      Psychological Disease   Psychological Disease  Negative    Depression  Negative      Total Score   Opioid Risk Tool Scoring  0    Opioid Risk Interpretation  Low Risk      ORT Scoring interpretation table:  Score <3 = Low Risk for SUD  Score between 4-7 = Moderate Risk for SUD  Score >8 = High Risk for Opioid Abuse   Risk Mitigation Strategies:  Patient Counseling: Covered Patient-Prescriber Agreement (PPA): Present and active  Notification to other healthcare providers: Done  Pharmacologic Plan: No change in therapy, at this time.             Laboratory Chemistry  Inflammation Markers (CRP: Acute Phase) (ESR: Chronic Phase) Lab Results  Component Value Date   LATICACIDVEN 1.4 09/05/2015                 Rheumatology Markers No results found for: RF, ANA, Therisa [place], [facility]              Renal Function Markers Lab Results  Component Value Date   BUN 18 09/06/2017   CREATININE 0.68 09/06/2017   GFRAA >60 09/06/2017   GFRNONAA >60 09/06/2017  Hepatic Function Markers Lab Results  Component Value Date   AST 18 07/18/2017   ALT 13 07/18/2017   ALBUMIN 4.0  07/18/2017   ALKPHOS 75 07/18/2017   LIPASE 20 (L) 09/04/2015                 Electrolytes Lab Results  Component Value Date   NA 138 09/06/2017   K 3.5 09/06/2017   CL 98 (L) 09/06/2017   CALCIUM 7.7 (L) 09/06/2017                 Neuropathy Markers Lab Results  Component Value Date   HGBA1C 8.4 (H) 07/18/2017   HIV Non Reactive 07/18/2017                 Bone Pathology Markers No results found for: VD25OH, VD125OH2TOT, [id number], KN3976BH4, 25OHVITD1, 25OHVITD2, 25OHVITD3, TESTOFREE, TESTOSTERONE               Coagulation Parameters Lab Results  Component Value Date   INR 0.97 08/26/2017   LABPROT 12.8 08/26/2017   APTT 24 08/26/2017   PLT 301 09/04/2017                 Cardiovascular Markers Lab Results  Component Value Date   CKTOTAL 62 11/04/2013   CKMB 1.2 11/04/2013   TROPONINI 1.00 (H) 09/05/2015   HGB 9.6 (L) 09/04/2017   HCT 29.8 (L) 09/04/2017                 CA Markers No results found for: CEA, CA125, LABCA2               Note: Lab results reviewed.  Recent Diagnostic Imaging Results  DG Knee 1-2 Views Right CLINICAL DATA:  Knee pain  EXAM: RIGHT KNEE - 1-2 VIEW  COMPARISON:  09/02/2017  FINDINGS: No fracture or malalignment. Marked arthritis of the medial compartment of the knee with narrowing, spurring and sclerosis. Mild degenerative changes of the lateral and patellofemoral compartments. No large knee effusion.  IMPRESSION: Marked arthritis involving medial compartment of the right knee. No acute osseous abnormality.  Electronically Signed   By: Donavan Foil M.D.   On: 11/19/2017 15:45 DG Knee 1-2 Views Left CLINICAL DATA:  Bilateral knee pain for many years  EXAM: LEFT KNEE - 1-2 VIEW  COMPARISON:  CT 12/31/2013, radiograph 09/02/2013  FINDINGS: Interval left medial hemiarthroplasty with intact appearing hardware. Slight lateral tilt of the femoral prosthetic. Mild degenerative change of the lateral compartment and  patellofemoral compartment. Trace knee effusion. Probable hardware defects in the proximal to midshaft of the tibia. Prominent spurring of the tibial spines.  IMPRESSION: 1. No acute osseous abnormality. 2. Interval medial hemiarthroplasty of the left knee 3. Mild degenerative changes of the lateral and patellofemoral compartments with small knee effusion.  Electronically Signed   By: Donavan Foil M.D.   On: 11/19/2017 15:44 DG HIP UNILAT W OR W/O PELVIS 2-3 VIEWS RIGHT CLINICAL DATA:  Hip pain for many years  EXAM: DG HIP (WITH OR WITHOUT PELVIS) 2-3V RIGHT  COMPARISON:  None.  FINDINGS: No fracture or malalignment. Pubic symphysis and rami are intact. Moderate arthritis of the right hip with joint space narrowing, minimal sclerosis and osteophyte.  IMPRESSION: Moderate arthritis of the right hip.  No acute osseous abnormality.  Electronically Signed   By: Donavan Foil M.D.   On: 11/19/2017 15:40 DG HIP UNILAT W OR W/O PELVIS 2-3 VIEWS LEFT CLINICAL DATA:  Bilateral hip pain for many  years  EXAM: DG HIP (WITH OR WITHOUT PELVIS) 2-3V LEFT  COMPARISON:  CT 09/05/2015  FINDINGS: Mild SI joint degenerative change. Pubic symphysis and rami are intact. Moderate arthritis of the left hip with joint space narrowing. No fracture or malalignment.  IMPRESSION: Moderate arthritis of the left hip. Mild SI joint degenerative changes.  Electronically Signed   By: Donavan Foil M.D.   On: 11/19/2017 15:39 DG Lumbar Spine Complete W/Bend CLINICAL DATA:  Chronic low back pain bilateral hip and knee pain for many years history of rheumatoid arthritis and fibromyalgia  EXAM: LUMBAR SPINE - COMPLETE WITH BENDING VIEWS  COMPARISON:  CT 09/05/2015  FINDINGS: Five non rib bearing lumbar type vertebra. Vertebral body heights are normal. Mild anterior osteophytes of the lower thoracic spine. Mild degenerative changes at L2-L3. No significant change in alignment with flexion  or extension.  IMPRESSION: Mild degenerative changes.  No acute osseous abnormality.  Electronically Signed   By: Donavan Foil M.D.   On: 11/19/2017 15:38  Complexity Note: Imaging results reviewed. Results shared with Ms. Behan, using Layman's terms.                         Meds   Current Outpatient Medications:  .  cetirizine (ZYRTEC) 10 MG tablet, TAKE 1 TABLET (10 MG TOTAL) BY MOUTH DAILY. (NOT COVER*), Disp: 30 tablet, Rfl: 2 .  citalopram (CELEXA) 40 MG tablet, Take 40 mg by mouth daily., Disp: , Rfl: 3 .  furosemide (LASIX) 20 MG tablet, Take 1 tablet (20 mg total) by mouth daily., Disp: 90 tablet, Rfl: 0 .  gabapentin (NEURONTIN) 300 MG capsule, Take 2 capsules (600 mg total) by mouth 3 (three) times daily., Disp: 180 capsule, Rfl: 5 .  glipiZIDE (GLUCOTROL) 5 MG tablet, Take 1 tablet (5 mg total) by mouth 2 (two) times daily before a meal., Disp: 60 tablet, Rfl: 2 .  glucose blood (ACCU-CHEK AVIVA PLUS) test strip, To check blood sugar once a day.  DX: E11.9, Disp: 100 each, Rfl: 1 .  methotrexate (RHEUMATREX) 2.5 MG tablet, Take 15 mg by mouth once a week. , Disp: , Rfl:  .  oxyCODONE-acetaminophen (PERCOCET) 10-325 MG tablet, Take 1 tablet by mouth every 8 (eight) hours as needed for pain. For chronic pain To fill on or after: 12/18/17, 01/17/18 To last for 30 days from fill date, Disp: 90 tablet, Rfl: 0 .  meloxicam (MOBIC) 15 MG tablet, Take 1 tablet (15 mg total) by mouth daily for 14 days., Disp: 14 tablet, Rfl: 0  ROS  Constitutional: Denies any fever or chills Gastrointestinal: No reported hemesis, hematochezia, vomiting, or acute GI distress Musculoskeletal: Denies any acute onset joint swelling, redness, loss of ROM, or weakness Neurological: No reported episodes of acute onset apraxia, aphasia, dysarthria, agnosia, amnesia, paralysis, loss of coordination, or loss of consciousness  Allergies  Ms. Caridi is allergic to latex; doxycycline; and sulfa  antibiotics.  [place]  Drug: Ms. Rund  reports that she does not use drugs. Alcohol:  reports that she does not drink alcohol. Tobacco:  reports that she quit smoking about 21 years ago. She has a 29.00 pack-year smoking history. she has never used smokeless tobacco. Medical:  has a past medical history of Arthritis, BRCA negative (07/2017), Cancer ([place]), Cellulitis of both lower extremities, CHF (congestive heart failure) ([place]), Coronary artery disease, Diabetes mellitus without complication ([place]), Family history of breast cancer (07/2017), Family history of ovarian cancer, Fibromyalgia, Fibromyalgia affecting  shoulder region, Hyperlipidemia, Hypertension, and Spinal stenosis. Surgical: Ms. Heiner  has a past surgical history that includes Medial partial knee replacement (Left, 01/06/2014); Vascular Stent (11/04/2013); Anal fissurectomy (01/2004); Hernia repair; Umbilical hernia repair (N/A, 09/05/2015); Ablation saphenous vein w/ RFA; Coronary angioplasty; Dilation and curettage of uterus; Laparoscopic hysterectomy (N/A, 09/03/2017); and Cystoscopy (09/03/2017). Family: family history includes Aneurysm in her father; Breast cancer (age of onset: 87) in her sister; Breast cancer (age of onset: 25) in her sister; COPD in her father and sister; Cataracts in her father and mother; Colon cancer in her paternal uncle and paternal uncle; Dementia in her father and mother; Diabetes in her father; Fibromyalgia in her sister; Heart attack in her brother; Hypertension in her mother and sister; Melanoma in her father; Migraines in her brother and sister; Ovarian cancer (age of onset: 49) in her sister; Thyroid disease in her mother; Uterine cancer in her cousin and cousin.  Constitutional Exam  General appearance: Well nourished, well developed, and well hydrated. In no apparent acute distress Vitals:   12/17/17 1058  BP: (!) 155/70  Pulse: 84  Resp: 16  Temp: 98.1 F (36.7 C)  SpO2: 95%  Weight: (!) 320 lb  (145.2 kg)  Height: '5\' 8"'  (1.727 m)   BMI Assessment: Estimated body mass index is 48.66 kg/m as calculated from the following:   Height as of this encounter: '5\' 8"'  (1.727 m).   Weight as of this encounter: 320 lb (145.2 kg).  BMI interpretation table: BMI level Category Range association with higher incidence of chronic pain  <18 kg/m2 Underweight   18.5-24.9 kg/m2 Ideal body weight   25-29.9 kg/m2 Overweight Increased incidence by 20%  30-34.9 kg/m2 Obese (Class I) Increased incidence by 68%  35-39.9 kg/m2 Severe obesity (Class II) Increased incidence by 136%  >40 kg/m2 Extreme obesity (Class [name]) Increased incidence by 254%   BMI Readings from Last 4 Encounters:  12/17/17 48.66 kg/m  11/18/17 48.55 kg/m  11/14/17 49.11 kg/m  10/16/17 48.35 kg/m   Wt Readings from Last 4 Encounters:  12/17/17 (!) 320 lb (145.2 kg)  11/18/17 (!) 324 lb (147 kg)  11/14/17 (!) 323 lb (146.5 kg)  10/16/17 (!) 318 lb (144.2 kg)  Psych/Mental status: Alert, oriented x 3 (person, place, & time)       Eyes: PERLA Respiratory: No evidence of acute respiratory distress  Cervical Spine Area Exam  Skin & Axial Inspection: No masses, redness, edema, swelling, or associated skin lesions Alignment: Symmetrical Functional ROM: Unrestricted ROM      Stability: No instability detected Muscle Tone/Strength: Functionally intact. No obvious neuro-muscular anomalies detected. Sensory (Neurological): Unimpaired Palpation: No palpable anomalies              Upper Extremity (UE) Exam    Side: Right upper extremity  Side: Left upper extremity  Skin & Extremity Inspection: Skin color, temperature, and hair growth are WNL. No peripheral edema or cyanosis. No masses, redness, swelling, asymmetry, or associated skin lesions. No contractures.  Skin & Extremity Inspection: Skin color, temperature, and hair growth are WNL. No peripheral edema or cyanosis. No masses, redness, swelling, asymmetry, or associated skin  lesions. No contractures.  Functional ROM: Unrestricted ROM          Functional ROM: Unrestricted ROM          Muscle Tone/Strength: Functionally intact. No obvious neuro-muscular anomalies detected.  Muscle Tone/Strength: Functionally intact. No obvious neuro-muscular anomalies detected.  Sensory (Neurological): Unimpaired  Sensory (Neurological): Unimpaired          Palpation: No palpable anomalies              Palpation: No palpable anomalies              Specialized Test(s): Deferred         Specialized Test(s): Deferred          Thoracic Spine Area Exam  Skin & Axial Inspection: No masses, redness, or swelling Alignment: Symmetrical Functional ROM: Unrestricted ROM Stability: No instability detected Muscle Tone/Strength: Functionally intact. No obvious neuro-muscular anomalies detected. Sensory (Neurological): Unimpaired Muscle strength & Tone: No palpable anomalies  Lumbar Spine Area Exam  Skin & Axial Inspection: No masses, redness, or swelling Alignment: Symmetrical Functional ROM: Unrestricted ROM      Stability: No instability detected Muscle Tone/Strength: Functionally intact. No obvious neuro-muscular anomalies detected. Sensory (Neurological): Unimpaired Palpation: No palpable anomalies       Provocative Tests: Lumbar Hyperextension and rotation test: Positive bilaterally for facet joint pain. Lumbar Lateral bending test: Positive due to pain. Patrick's Maneuver: Positive for bilateral S-I arthralgia              Gait & Posture Assessment  Ambulation: Unassisted Gait: Relatively normal for age and body habitus Posture: WNL   Lower Extremity Exam    Side: Right lower extremity  Side: Left lower extremity  Skin & Extremity Inspection: Skin color, temperature, and hair growth are WNL. No peripheral edema or cyanosis. No masses, redness, swelling, asymmetry, or associated skin lesions. No contractures.  Skin & Extremity Inspection: Skin color, temperature,  and hair growth are WNL. No peripheral edema or cyanosis. No masses, redness, swelling, asymmetry, or associated skin lesions. No contractures.  Functional ROM: Mechanically restricted ROM          Functional ROM: Mechanically restricted ROM          Muscle Tone/Strength: Functionally intact. No obvious neuro-muscular anomalies detected.  Muscle Tone/Strength: Functionally intact. No obvious neuro-muscular anomalies detected.  Sensory (Neurological): Unimpaired  Sensory (Neurological): Unimpaired  Palpation:  Tender to palpation of bilateral patellas  Palpation: Tender to palpation of bilateral patellas     Assessment  Primary Diagnosis & Pertinent Problem List: The primary encounter diagnosis was Primary osteoarthritis of both knees. 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  Status Diagnosis  Persistent Controlled Persistent 1. Primary osteoarthritis of both knees   2. S/P total knee replacement, left   3. 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 Fibromyalgia   5. Chronic venous insufficiency   6. Chronic pain syndrome       General Recommendations: The pain condition that the patient suffers from is best treated with a multidisciplinary approach that involves an increase in physical activity to prevent de-conditioning and worsening of the pain cycle, as well as psychological counseling (formal and/or informal) to address the co-morbid psychological affects of pain. Treatment will often involve judicious use of pain medications and interventional procedures to decrease the pain, allowing the patient to participate in the physical activity that will ultimately produce long-lasting pain reductions. The goal of the multidisciplinary approach is to return the patient to a higher level of overall function and to restore their ability to perform activities of daily  living.  67 year old female with a history of morbid obesity, depression, type 2 diabetes, GI ulcer, hypertension who presents with diffuse body  pain including arthralgias of multiple joints secondary to fibromyalgia and low back pain secondary to lumbar degenerative disc disease, lumbar spinal stenosis,and bilateral knee pain secondary to knee osteoarthritis and morbid obesity. Patient has been on chronic opioid therapy for more than 10 years.   Of note patient's husband has metastatic cancer and this is been a huge concern for the patient as she is his primary caregiver.  The patient continues to endorse severe left shoulder pain and swelling.  Today we reviewed her x-rays specifically her SI joint x-rays along with her bilateral knee x-rays.  We discussed starting off with bilateral genicular nerve block for her bilateral knee osteoarthritis.  She has had a left knee replacement surgery in the past.  Afterwards we can focus on left SI joint injection.  In regards to medication management, will refill the patient's Percocet as previously prescribed.  In regards to her left shoulder pain which is likely arthritic in nature, I will prescribe Mobic 15 mg daily for 14 days.  Patient's most recent creatinine on September 06, 2017 was 0.68.  Since the patient is now endorsing reduced benefit of the gabapentin without any significant side effects, will have her increase her nighttime dose to 1200 mill grams nightly and continue her current daytime dose.  I have encouraged the patient to participate in physical therapy and home exercises as her morbid obesity is contributing significantly to her chronic pain syndrome.  Patient has been counseled on the importance of exercise, healthy eating, weight loss.  Plan: -Refill oxycodone prescription as below -Meloxicam 15 mg daily times 14 days -Recommended patient increase nighttime gabapentin dose to 1200 mill grams nightly.  Continue daytime doses of 600 mg, 600 mg.   Patient notified that if she has worsening lower extremity swelling to reduce her dose back down to 600 mg 3 times daily. -Bilateral genicular nerve block with steroid under fluoroscopy for bilateral osteoarthritis of knees. -Encouraged patient to exercise, counseled on healthy eating strategies, and the importance of weight loss in the management of her chronic pain syndrome.  Future considerations: -Left SI joint injection -Left piriformis injection   Plan of Care  Pharmacotherapy (Medications Ordered): Meds ordered this encounter  Medications  . DISCONTD: oxyCODONE-acetaminophen (PERCOCET) 10-325 MG tablet    Sig: Take 1 tablet by mouth every 8 (eight) hours as needed for pain. For chronic pain To fill on or after: 12/18/17, 01/17/18 To last for 30 days from fill date    Dispense:  90 tablet    Refill:  0    Do not place this medication, or any other prescription from our practice, on "Automatic Refill". Patient may have prescription filled one day early if pharmacy is closed on scheduled refill date. Do not fill until:  To last until:  . oxyCODONE-acetaminophen (PERCOCET) 10-325 MG tablet    Sig: Take 1 tablet by mouth every 8 (eight) hours as needed for pain. For chronic pain To fill on or after: 12/18/17, 01/17/18 To last for 30 days from fill date    Dispense:  90 tablet    Refill:  0    Do not place this medication, or any other prescription from our practice, on "Automatic Refill". Patient may have prescription filled one day early if pharmacy is closed on scheduled refill date. Do not fill until:  To last until:  . meloxicam (MOBIC) 15 MG tablet    Sig: Take 1 tablet (15 mg total) by mouth daily for 14 days.    Dispense:  14 tablet    Refill:  0    Do not place this medication, or any other prescription from our practice, on "Automatic Refill". Patient may have prescription filled one day early if pharmacy is closed on scheduled refill date.   Lab-work, procedure(s), and/or  referral(s): Orders Placed This Encounter  Procedures  . GENICULAR NERVE BLOCK    Time Note: Greater than 50% of the 25 minute(s) of face-to-face time spent with Ms. Desrosiers, was spent in counseling/coordination of care regarding: Ms. Kiner primary cause of pain, the results of her recent test(s), the significance of each one oth the test(s) anomalies and it's corresponding characteristic pain pattern(s), the treatment plan, treatment alternatives, medication side effects, going over the informed consent, the opioid analgesic risks and possible complications, realistic expectations and the need to bring and keep the BMI below 30. Provider-requested follow-up: Return in about 2 weeks (around 12/31/2017) for Procedure.  No future appointments.  Primary Care Physician: Virginia Crews, MD Location: [facility] Note by: Gillis Santa, M.D Date: 12/17/2017; Time: 12:11 PM  Patient Instructions   1.  Refill oxycodone as previously prescribed. 2.  Meloxicam 15 mg daily for 14 days. 3.  Scheduled for bilateral genicular nerve block without sedation. 4.  Increase gabapentin so that dose is 600 mg, 600 mg, 1200 mg nightly. Knee Injection A knee injection is a procedure to get medicine into your knee joint. Your health care provider puts a needle into the joint and injects medicine with an attached syringe. The injected medicine may relieve the pain, swelling, and stiffness of arthritis. The injected medicine may also help to lubricate and cushion your knee joint. You may need more than one injection. Tell a health care provider about:  Any allergies you have.  All medicines you are taking, including vitamins, herbs, eye drops, creams, and over-the-counter medicines.  Any problems you or family members have had with anesthetic medicines.  Any blood disorders you have.  Any surgeries you have had.  Any medical conditions you have. What are the  risks? Generally, this is a safe procedure. However, problems may occur, including:  Infection.  Bleeding.  Worsening symptoms.  Damage to the area around your knee.  Allergic reaction to any of the medicines.  Skin reactions from repeated injections.  What happens before the procedure?  Ask your health care provider about changing or stopping your regular medicines. This is especially important if you are taking diabetes medicines or blood thinners.  Plan to have someone take you home after the procedure. What happens during the procedure?  You will sit or lie down in a position for your knee to be treated.  The skin over your kneecap will be cleaned with a germ-killing solution (antiseptic).  You will be given a medicine that numbs the area (local anesthetic). You may feel some stinging.  After your knee becomes numb, you will have a second injection. This is the medicine. This needle is carefully placed between your kneecap and your knee. The medicine is injected into the joint space.  At the end of the procedure, the needle will be removed.  A bandage (dressing) may be placed over the injection site. The procedure may vary among health care providers and hospitals. What happens after the procedure?  You may have to move your knee through its full range of motion. This helps to get all of the medicine into your joint space.  Your blood pressure, heart rate, breathing rate, and  blood oxygen level will be monitored often until the medicines you were given have worn off.  You will be watched to make sure that you do not have a reaction to the injected medicine. This information is not intended to replace advice given to you by your health care provider. Make sure you discuss any questions you have with your health care provider. Document Released: 02/10/2007 Document Revised: 04/20/2016 Document Reviewed: 09/29/2014 Elsevier Interactive Patient Education  2018 [employer]  What are the risk, side effects and possible complications? Generally speaking, most procedures are safe.  However, with any procedure there are risks, side effects, and the possibility of complications.  The risks and complications are dependent upon the sites that are lesioned, or the type of nerve block to be performed.  The closer the procedure is to the spine, the more serious the risks are.  Great care is taken when placing the radio frequency needles, block needles or lesioning probes, but sometimes complications can occur. 1. Infection: Any time there is an injection through the skin, there is a risk of infection.  This is why sterile conditions are used for these blocks.  There are four possible types of infection. 1. Localized skin infection. 2. Central Nervous System Infection-This can be in the form of Meningitis, which can be deadly. 3. Epidural Infections-This can be in the form of an epidural abscess, which can cause pressure inside of the spine, causing compression of the spinal cord with subsequent paralysis. This would require an emergency surgery to decompress, and there are no guarantees that the patient would recover from the paralysis. 4. Discitis-This is an infection of the intervertebral discs.  It occurs in about 1% of discography procedures.  It is difficult to treat and it may lead to surgery.        2. Pain: the needles have to go through skin and soft tissues, will cause soreness.       3. Damage to internal structures:  The nerves to be lesioned may be near blood vessels or    other nerves which can be potentially damaged.       4. Bleeding: Bleeding is more common if the patient is taking blood thinners such as  aspirin, Coumadin, Ticiid, Plavix, etc., or if he/she have some genetic predisposition  such as hemophilia. Bleeding into the spinal canal can cause compression of the spinal  cord with subsequent paralysis.  This would  require an emergency surgery to  decompress and there are no guarantees that the patient would recover from the  paralysis.       5. Pneumothorax:  Puncturing of a lung is a possibility, every time a needle is introduced in  the area of the chest or upper back.  Pneumothorax refers to free air around the  collapsed lung(s), inside of the thoracic cavity (chest cavity).  Another two possible  complications related to a similar event would include: Hemothorax and Chylothorax.   These are variations of the Pneumothorax, where instead of air around the collapsed  lung(s), you may have blood or chyle, respectively.       6. Spinal headaches: They may occur with any procedures in the area of the spine.       7. Persistent CSF (Cerebro-Spinal Fluid) leakage: This is a rare problem, but may occur  with prolonged intrathecal or epidural catheters either due to the formation of a fistulous  track or a dural tear.  8. Nerve damage: By working so close to the spinal cord, there is always a possibility of  nerve damage, which could be as serious as a permanent spinal cord injury with  paralysis.       9. Death:  Although rare, severe deadly allergic reactions known as "Anaphylactic  reaction" can occur to any of the medications used.      10. Worsening of the symptoms:  We can always make thing worse.  What are the chances of something like this happening? Chances of any of this occuring are extremely low.  By statistics, you have more of a chance of getting killed in a motor vehicle accident: while driving to the hospital than any of the above occurring .  Nevertheless, you should be aware that they are possibilities.  In general, it is similar to taking a shower.  Everybody knows that you can slip, hit your head and get killed.  Does that mean that you should not shower again?  Nevertheless always keep in mind that statistics do not mean anything if you happen to be on the wrong side of them.  Even if a procedure  has a 1 (one) in a 1,000,000 (million) chance of going wrong, it you happen to be that one..Also, keep in mind that by statistics, you have more of a chance of having something go wrong when taking medications.  Who should not have this procedure? If you are on a blood thinning medication (e.g. Coumadin, Plavix, see list of "Blood Thinners"), or if you have an active infection going on, you should not have the procedure.  If you are taking any blood thinners, please inform your physician.  How should I prepare for this procedure?  Do not eat or drink anything at least six hours prior to the procedure.  Bring a driver with you .  It cannot be a taxi.  Come accompanied by an adult that can drive you back, and that is strong enough to help you if your legs get weak or numb from the local anesthetic.  Take all of your medicines the morning of the procedure with just enough water to swallow them.  If you have diabetes, make sure that you are scheduled to have your procedure done first thing in the morning, whenever possible.  If you have diabetes, take only half of your insulin dose and notify our nurse that you have done so as soon as you arrive at the clinic.  If you are diabetic, but only take blood sugar pills (oral hypoglycemic), then do not take them on the morning of your procedure.  You may take them after you have had the procedure.  Do not take aspirin or any aspirin-containing medications, at least eleven (11) days prior to the procedure.  They may prolong bleeding.  Wear loose fitting clothing that may be easy to take off and that you would not mind if it got stained with Betadine or blood.  Do not wear any jewelry or perfume  Remove any nail coloring.  It will interfere with some of our monitoring equipment.  NOTE: Remember that this is not meant to be interpreted as a complete list of all possible complications.  Unforeseen problems may occur.  BLOOD THINNERS The following  drugs contain aspirin or other products, which can cause increased bleeding during surgery and should not be taken for 2 weeks prior to and 1 week after surgery.  If you should need take something for relief of minor  pain, you may take acetaminophen which is found in Tylenol,m Datril, Anacin-3 and Panadol. It is not blood thinner. The products listed below are.  Do not take any of the products listed below in addition to any listed on your instruction sheet.  A.P.C or A.P.C with Codeine Codeine Phosphate Capsules #3 Ibuprofen Ridaura  ABC compound Congesprin Imuran rimadil  Advil Cope Indocin Robaxisal  Alka-Seltzer Effervescent Pain Reliever and Antacid Coricidin or Coricidin-D  Indomethacin Rufen  Alka-Seltzer plus Cold Medicine Cosprin Ketoprofen S-A-C Tablets  Anacin Analgesic Tablets or Capsules Coumadin Korlgesic Salflex  Anacin Extra Strength Analgesic tablets or capsules CP-2 Tablets Lanoril Salicylate  Anaprox Cuprimine Capsules Levenox Salocol  Anexsia-D Dalteparin Magan Salsalate  Anodynos Darvon compound Magnesium Salicylate Sine-off  Ansaid Dasin Capsules Magsal Sodium Salicylate  Anturane Depen Capsules Marnal Soma  APF Arthritis pain formula Dewitt's Pills Measurin Stanback  Argesic Dia-Gesic Meclofenamic Sulfinpyrazone  Arthritis Bayer Timed Release Aspirin Diclofenac Meclomen Sulindac  Arthritis pain formula Anacin Dicumarol Medipren Supac  Analgesic (Safety coated) Arthralgen Diffunasal Mefanamic Suprofen  Arthritis Strength Bufferin Dihydrocodeine Mepro Compound Suprol  Arthropan liquid Dopirydamole Methcarbomol with Aspirin Synalgos  ASA tablets/Enseals Disalcid Micrainin Tagament  Ascriptin Doan's Midol Talwin  Ascriptin A/D Dolene Mobidin Tanderil  Ascriptin Extra Strength Dolobid Moblgesic Ticlid  Ascriptin with Codeine Doloprin or Doloprin with Codeine Momentum Tolectin  Asperbuf Duoprin Mono-gesic Trendar  Aspergum Duradyne Motrin or Motrin IB Triminicin  Aspirin  plain, buffered or enteric coated Durasal Myochrisine Trigesic  Aspirin Suppositories Easprin Nalfon Trillsate  Aspirin with Codeine Ecotrin Regular or Extra Strength Naprosyn Uracel  Atromid-S Efficin Naproxen Ursinus  Auranofin Capsules Elmiron Neocylate Vanquish  Axotal Emagrin Norgesic Verin  Azathioprine Empirin or Empirin with Codeine Normiflo Vitamin E  Azolid Emprazil Nuprin Voltaren  Bayer Aspirin plain, buffered or children's or timed BC Tablets or powders Encaprin Orgaran Warfarin Sodium  Buff-a-Comp Enoxaparin Orudis Zorpin  Buff-a-Comp with Codeine Equegesic Os-Cal-Gesic   Buffaprin Excedrin plain, buffered or Extra Strength Oxalid   Bufferin Arthritis Strength Feldene Oxphenbutazone   Bufferin plain or Extra Strength Feldene Capsules Oxycodone with Aspirin   Bufferin with Codeine Fenoprofen Fenoprofen Pabalate or Pabalate-SF   Buffets II Flogesic Panagesic   Buffinol plain or Extra Strength Florinal or Florinal with Codeine Panwarfarin   Buf-Tabs Flurbiprofen Penicillamine   Butalbital Compound Four-way cold tablets Penicillin   Butazolidin Fragmin Pepto-Bismol   Carbenicillin Geminisyn Percodan   Carna Arthritis Reliever Geopen Persantine   Carprofen Gold's salt Persistin   Chloramphenicol Goody's Phenylbutazone   Chloromycetin Haltrain Piroxlcam   Clmetidine heparin Plaquenil   Cllnoril Hyco-pap Ponstel   Clofibrate Hydroxy chloroquine Propoxyphen         Before stopping any of these medications, be sure to consult the physician who ordered them.  Some, such as Coumadin (Warfarin) are ordered to prevent or treat serious conditions such as "deep thrombosis", "pumonary embolisms", and other heart problems.  The amount of time that you may need off of the medication may also vary with the medication and the reason for which you were taking it.  If you are taking any of these medications, please make sure you notify your pain physician before you undergo any  procedures.

## 2017-12-17 NOTE — Progress Notes (Signed)
Nursing Pain Medication Assessment:  Safety precautions to be maintained throughout the outpatient stay will include: orient to surroundings, keep bed in low position, maintain call bell within reach at all times, provide assistance with transfer out of bed and ambulation.  Medication Inspection Compliance: Pill count conducted under aseptic conditions, in front of the patient. Neither the pills nor the bottle was removed from the patient's sight at any time. Once count was completed pills were immediately returned to the patient in their original bottle.  Medication: See above Pill/Patch Count: 7 of 90 pills remain Pill/Patch Appearance: Markings consistent with prescribed medication Bottle Appearance: Standard pharmacy container. Clearly labeled. Filled Date: 37 / 17 / 2018 Last Medication intake:  Today

## 2017-12-17 NOTE — Patient Instructions (Addendum)
1.  Refill oxycodone as previously prescribed. 2.  Meloxicam 15 mg daily for 14 days. 3.  Scheduled for bilateral genicular nerve block without sedation. 4.  Increase gabapentin so that dose is 600 mg, 600 mg, 1200 mg nightly. Knee Injection A knee injection is a procedure to get medicine into your knee joint. Your health care provider puts a needle into the joint and injects medicine with an attached syringe. The injected medicine may relieve the pain, swelling, and stiffness of arthritis. The injected medicine may also help to lubricate and cushion your knee joint. You may need more than one injection. Tell a health care provider about:  Any allergies you have.  All medicines you are taking, including vitamins, herbs, eye drops, creams, and over-the-counter medicines.  Any problems you or family members have had with anesthetic medicines.  Any blood disorders you have.  Any surgeries you have had.  Any medical conditions you have. What are the risks? Generally, this is a safe procedure. However, problems may occur, including:  Infection.  Bleeding.  Worsening symptoms.  Damage to the area around your knee.  Allergic reaction to any of the medicines.  Skin reactions from repeated injections.  What happens before the procedure?  Ask your health care provider about changing or stopping your regular medicines. This is especially important if you are taking diabetes medicines or blood thinners.  Plan to have someone take you home after the procedure. What happens during the procedure?  You will sit or lie down in a position for your knee to be treated.  The skin over your kneecap will be cleaned with a germ-killing solution (antiseptic).  You will be given a medicine that numbs the area (local anesthetic). You may feel some stinging.  After your knee becomes numb, you will have a second injection. This is the medicine. This needle is carefully placed between your kneecap  and your knee. The medicine is injected into the joint space.  At the end of the procedure, the needle will be removed.  A bandage (dressing) may be placed over the injection site. The procedure may vary among health care providers and hospitals. What happens after the procedure?  You may have to move your knee through its full range of motion. This helps to get all of the medicine into your joint space.  Your blood pressure, heart rate, breathing rate, and blood oxygen level will be monitored often until the medicines you were given have worn off.  You will be watched to make sure that you do not have a reaction to the injected medicine. This information is not intended to replace advice given to you by your health care provider. Make sure you discuss any questions you have with your health care provider. Document Released: 02/10/2007 Document Revised: 04/20/2016 Document Reviewed: 09/29/2014 Elsevier Interactive Patient Education  2018 [place]  What are the risk, side effects and possible complications? Generally speaking, most procedures are safe.  However, with any procedure there are risks, side effects, and the possibility of complications.  The risks and complications are dependent upon the sites that are lesioned, or the type of nerve block to be performed.  The closer the procedure is to the spine, the more serious the risks are.  Great care is taken when placing the radio frequency needles, block needles or lesioning probes, but sometimes complications can occur. 1. Infection: Any time there is an injection through the skin, there is a risk of  infection.  This is why sterile conditions are used for these blocks.  There are four possible types of infection. 1. Localized skin infection. 2. Central Nervous System Infection-This can be in the form of Meningitis, which can be deadly. 3. Epidural Infections-This can be in the form of an epidural  abscess, which can cause pressure inside of the spine, causing compression of the spinal cord with subsequent paralysis. This would require an emergency surgery to decompress, and there are no guarantees that the patient would recover from the paralysis. 4. Discitis-This is an infection of the intervertebral discs.  It occurs in about 1% of discography procedures.  It is difficult to treat and it may lead to surgery.        2. Pain: the needles have to go through skin and soft tissues, will cause soreness.       3. Damage to internal structures:  The nerves to be lesioned may be near blood vessels or    other nerves which can be potentially damaged.       4. Bleeding: Bleeding is more common if the patient is taking blood thinners such as  aspirin, Coumadin, Ticiid, Plavix, etc., or if he/she have some genetic predisposition  such as hemophilia. Bleeding into the spinal canal can cause compression of the spinal  cord with subsequent paralysis.  This would require an emergency surgery to  decompress and there are no guarantees that the patient would recover from the  paralysis.       5. Pneumothorax:  Puncturing of a lung is a possibility, every time a needle is introduced in  the area of the chest or upper back.  Pneumothorax refers to free air around the  collapsed lung(s), inside of the thoracic cavity (chest cavity).  Another two possible  complications related to a similar event would include: Hemothorax and Chylothorax.   These are variations of the Pneumothorax, where instead of air around the collapsed  lung(s), you may have blood or chyle, respectively.       6. Spinal headaches: They may occur with any procedures in the area of the spine.       7. Persistent CSF (Cerebro-Spinal Fluid) leakage: This is a rare problem, but may occur  with prolonged intrathecal or epidural catheters either due to the formation of a fistulous  track or a dural tear.       8. Nerve damage: By working so close to the  spinal cord, there is always a possibility of  nerve damage, which could be as serious as a permanent spinal cord injury with  paralysis.       9. Death:  Although rare, severe deadly allergic reactions known as "Anaphylactic  reaction" can occur to any of the medications used.      10. Worsening of the symptoms:  We can always make thing worse.  What are the chances of something like this happening? Chances of any of this occuring are extremely low.  By statistics, you have more of a chance of getting killed in a motor vehicle accident: while driving to the hospital than any of the above occurring .  Nevertheless, you should be aware that they are possibilities.  In general, it is similar to taking a shower.  Everybody knows that you can slip, hit your head and get killed.  Does that mean that you should not shower again?  Nevertheless always keep in mind that statistics do not mean anything if you happen to be  on the wrong side of them.  Even if a procedure has a 1 (one) in a 1,000,000 (million) chance of going wrong, it you happen to be that one..Also, keep in mind that by statistics, you have more of a chance of having something go wrong when taking medications.  Who should not have this procedure? If you are on a blood thinning medication (e.g. Coumadin, Plavix, see list of "Blood Thinners"), or if you have an active infection going on, you should not have the procedure.  If you are taking any blood thinners, please inform your physician.  How should I prepare for this procedure?  Do not eat or drink anything at least six hours prior to the procedure.  Bring a driver with you .  It cannot be a taxi.  Come accompanied by an adult that can drive you back, and that is strong enough to help you if your legs get weak or numb from the local anesthetic.  Take all of your medicines the morning of the procedure with just enough water to swallow them.  If you have diabetes, make sure that you are  scheduled to have your procedure done first thing in the morning, whenever possible.  If you have diabetes, take only half of your insulin dose and notify our nurse that you have done so as soon as you arrive at the clinic.  If you are diabetic, but only take blood sugar pills (oral hypoglycemic), then do not take them on the morning of your procedure.  You may take them after you have had the procedure.  Do not take aspirin or any aspirin-containing medications, at least eleven (11) days prior to the procedure.  They may prolong bleeding.  Wear loose fitting clothing that may be easy to take off and that you would not mind if it got stained with Betadine or blood.  Do not wear any jewelry or perfume  Remove any nail coloring.  It will interfere with some of our monitoring equipment.  NOTE: Remember that this is not meant to be interpreted as a complete list of all possible complications.  Unforeseen problems may occur.  BLOOD THINNERS The following drugs contain aspirin or other products, which can cause increased bleeding during surgery and should not be taken for 2 weeks prior to and 1 week after surgery.  If you should need take something for relief of minor pain, you may take acetaminophen which is found in Tylenol,m Datril, Anacin-3 and Panadol. It is not blood thinner. The products listed below are.  Do not take any of the products listed below in addition to any listed on your instruction sheet.  A.P.C or A.P.C with Codeine Codeine Phosphate Capsules #3 Ibuprofen Ridaura  ABC compound Congesprin Imuran rimadil  Advil Cope Indocin Robaxisal  Alka-Seltzer Effervescent Pain Reliever and Antacid Coricidin or Coricidin-D  Indomethacin Rufen  Alka-Seltzer plus Cold Medicine Cosprin Ketoprofen S-A-C Tablets  Anacin Analgesic Tablets or Capsules Coumadin Korlgesic Salflex  Anacin Extra Strength Analgesic tablets or capsules CP-2 Tablets Lanoril Salicylate  Anaprox Cuprimine Capsules  Levenox Salocol  Anexsia-D Dalteparin Magan Salsalate  Anodynos Darvon compound Magnesium Salicylate Sine-off  Ansaid Dasin Capsules Magsal Sodium Salicylate  Anturane Depen Capsules Marnal Soma  APF Arthritis pain formula Dewitt's Pills Measurin Stanback  Argesic Dia-Gesic Meclofenamic Sulfinpyrazone  Arthritis Bayer Timed Release Aspirin Diclofenac Meclomen Sulindac  Arthritis pain formula Anacin Dicumarol Medipren Supac  Analgesic (Safety coated) Arthralgen Diffunasal Mefanamic Suprofen  Arthritis Strength Bufferin Dihydrocodeine Mepro Compound Suprol  Arthropan liquid Dopirydamole Methcarbomol with Aspirin Synalgos  ASA tablets/Enseals Disalcid Micrainin Tagament  Ascriptin Doan's Midol Talwin  Ascriptin A/D Dolene Mobidin Tanderil  Ascriptin Extra Strength Dolobid Moblgesic Ticlid  Ascriptin with Codeine Doloprin or Doloprin with Codeine Momentum Tolectin  Asperbuf Duoprin Mono-gesic Trendar  Aspergum Duradyne Motrin or Motrin IB Triminicin  Aspirin plain, buffered or enteric coated Durasal Myochrisine Trigesic  Aspirin Suppositories Easprin Nalfon Trillsate  Aspirin with Codeine Ecotrin Regular or Extra Strength Naprosyn Uracel  Atromid-S Efficin Naproxen Ursinus  Auranofin Capsules Elmiron Neocylate Vanquish  Axotal Emagrin Norgesic Verin  Azathioprine Empirin or Empirin with Codeine Normiflo Vitamin E  Azolid Emprazil Nuprin Voltaren  Bayer Aspirin plain, buffered or children's or timed BC Tablets or powders Encaprin Orgaran Warfarin Sodium  Buff-a-Comp Enoxaparin Orudis Zorpin  Buff-a-Comp with Codeine Equegesic Os-Cal-Gesic   Buffaprin Excedrin plain, buffered or Extra Strength Oxalid   Bufferin Arthritis Strength Feldene Oxphenbutazone   Bufferin plain or Extra Strength Feldene Capsules Oxycodone with Aspirin   Bufferin with Codeine Fenoprofen Fenoprofen Pabalate or Pabalate-SF   Buffets II Flogesic Panagesic   Buffinol plain or Extra Strength Florinal or Florinal with  Codeine Panwarfarin   Buf-Tabs Flurbiprofen Penicillamine   Butalbital Compound Four-way cold tablets Penicillin   Butazolidin Fragmin Pepto-Bismol   Carbenicillin Geminisyn Percodan   Carna Arthritis Reliever Geopen Persantine   Carprofen Gold's salt Persistin   Chloramphenicol Goody's Phenylbutazone   Chloromycetin Haltrain Piroxlcam   Clmetidine heparin Plaquenil   Cllnoril Hyco-pap Ponstel   Clofibrate Hydroxy chloroquine Propoxyphen         Before stopping any of these medications, be sure to consult the physician who ordered them.  Some, such as Coumadin (Warfarin) are ordered to prevent or treat serious conditions such as "deep thrombosis", "pumonary embolisms", and other heart problems.  The amount of time that you may need off of the medication may also vary with the medication and the reason for which you were taking it.  If you are taking any of these medications, please make sure you notify your pain physician before you undergo any procedures.

## 2017-12-28 ENCOUNTER — Other Ambulatory Visit: Payer: Self-pay | Admitting: Family Medicine

## 2018-01-04 ENCOUNTER — Other Ambulatory Visit: Payer: Self-pay | Admitting: Family Medicine

## 2018-01-04 DIAGNOSIS — E11628 Type 2 diabetes mellitus with other skin complications: Secondary | ICD-10-CM

## 2018-01-06 ENCOUNTER — Encounter: Payer: Self-pay | Admitting: Student in an Organized Health Care Education/Training Program

## 2018-01-06 ENCOUNTER — Ambulatory Visit
Payer: BLUE CROSS/BLUE SHIELD | Attending: Student in an Organized Health Care Education/Training Program | Admitting: Student in an Organized Health Care Education/Training Program

## 2018-01-06 ENCOUNTER — Ambulatory Visit: Admission: RE | Admit: 2018-01-06 | Payer: BLUE CROSS/BLUE SHIELD | Source: Ambulatory Visit

## 2018-01-06 VITALS — BP 163/92 | HR 84 | Temp 97.8°F | Resp 16 | Ht 68.0 in | Wt 318.0 lb

## 2018-01-06 DIAGNOSIS — E7800 Pure hypercholesterolemia, unspecified: Secondary | ICD-10-CM | POA: Diagnosis not present

## 2018-01-06 DIAGNOSIS — M1611 Unilateral primary osteoarthritis, right hip: Secondary | ICD-10-CM | POA: Insufficient documentation

## 2018-01-06 DIAGNOSIS — Z7984 Long term (current) use of oral hypoglycemic drugs: Secondary | ICD-10-CM | POA: Insufficient documentation

## 2018-01-06 DIAGNOSIS — M19012 Primary osteoarthritis, left shoulder: Secondary | ICD-10-CM | POA: Insufficient documentation

## 2018-01-06 DIAGNOSIS — M48062 Spinal stenosis, lumbar region with neurogenic claudication: Secondary | ICD-10-CM | POA: Insufficient documentation

## 2018-01-06 DIAGNOSIS — N8502 Endometrial intraepithelial neoplasia [EIN]: Secondary | ICD-10-CM | POA: Insufficient documentation

## 2018-01-06 DIAGNOSIS — I110 Hypertensive heart disease with heart failure: Secondary | ICD-10-CM | POA: Diagnosis not present

## 2018-01-06 DIAGNOSIS — I5022 Chronic systolic (congestive) heart failure: Secondary | ICD-10-CM | POA: Insufficient documentation

## 2018-01-06 DIAGNOSIS — M069 Rheumatoid arthritis, unspecified: Secondary | ICD-10-CM | POA: Diagnosis not present

## 2018-01-06 DIAGNOSIS — M47816 Spondylosis without myelopathy or radiculopathy, lumbar region: Secondary | ICD-10-CM

## 2018-01-06 DIAGNOSIS — F329 Major depressive disorder, single episode, unspecified: Secondary | ICD-10-CM | POA: Diagnosis not present

## 2018-01-06 DIAGNOSIS — I252 Old myocardial infarction: Secondary | ICD-10-CM | POA: Diagnosis not present

## 2018-01-06 DIAGNOSIS — E119 Type 2 diabetes mellitus without complications: Secondary | ICD-10-CM | POA: Diagnosis not present

## 2018-01-06 DIAGNOSIS — M1612 Unilateral primary osteoarthritis, left hip: Secondary | ICD-10-CM | POA: Insufficient documentation

## 2018-01-06 DIAGNOSIS — G894 Chronic pain syndrome: Secondary | ICD-10-CM | POA: Insufficient documentation

## 2018-01-06 DIAGNOSIS — J309 Allergic rhinitis, unspecified: Secondary | ICD-10-CM | POA: Diagnosis not present

## 2018-01-06 DIAGNOSIS — M797 Fibromyalgia: Secondary | ICD-10-CM | POA: Insufficient documentation

## 2018-01-06 DIAGNOSIS — Z87891 Personal history of nicotine dependence: Secondary | ICD-10-CM | POA: Diagnosis not present

## 2018-01-06 DIAGNOSIS — Z79899 Other long term (current) drug therapy: Secondary | ICD-10-CM | POA: Diagnosis not present

## 2018-01-06 DIAGNOSIS — I872 Venous insufficiency (chronic) (peripheral): Secondary | ICD-10-CM | POA: Insufficient documentation

## 2018-01-06 DIAGNOSIS — I2510 Atherosclerotic heart disease of native coronary artery without angina pectoris: Secondary | ICD-10-CM | POA: Diagnosis not present

## 2018-01-06 DIAGNOSIS — M170 Bilateral primary osteoarthritis of knee: Secondary | ICD-10-CM | POA: Diagnosis not present

## 2018-01-06 DIAGNOSIS — M25512 Pain in left shoulder: Secondary | ICD-10-CM | POA: Diagnosis present

## 2018-01-06 MED ORDER — ROPIVACAINE HCL 2 MG/ML IJ SOLN
10.0000 mL | Freq: Once | INTRAMUSCULAR | Status: DC
  Filled 2018-01-06: qty 10

## 2018-01-06 MED ORDER — LIDOCAINE HCL (PF) 1 % IJ SOLN
10.0000 mL | Freq: Once | INTRAMUSCULAR | Status: DC
  Filled 2018-01-06: qty 10

## 2018-01-06 MED ORDER — DEXAMETHASONE SODIUM PHOSPHATE 10 MG/ML IJ SOLN
10.0000 mg | Freq: Once | INTRAMUSCULAR | Status: DC
  Filled 2018-01-06: qty 1

## 2018-01-06 MED ORDER — DICLOFENAC SODIUM 75 MG PO TBEC: 75.0000 mg | DELAYED_RELEASE_TABLET | Freq: Two times a day (BID) | ORAL | 0 refills | Status: DC

## 2018-01-06 NOTE — Telephone Encounter (Signed)
Pt had DM FU on 01/13/2018.

## 2018-01-06 NOTE — Progress Notes (Signed)
Patient's Name: [name] [name]  MRN: [id number]  Referring Provider: Gillis Santa, MD  DOB: [birth year]  PCP: Virginia Crews, MD  DOS: 01/06/2018  Note by: Gillis Santa, MD  Service setting: Ambulatory outpatient  Specialty: Interventional Pain Management  Location: [facility] (AMB) Pain Management Facility    Patient type: Established   Primary Reason(s) for Visit: Encounter for prescription drug management. (Level of risk: moderate)  CC: Knee Pain (bilateral); Arm Pain (left ); Neck Pain (left); and Joint Pain (RA)  HPI  [name] [name] is a 67 y.o. year old, female patient, who comes today for a medication management evaluation. She has Allergic rhinitis; Chronic venous insufficiency; Atherosclerosis of coronary artery; Clinical depression; Diabetes mellitus, type 2 ([place]); Venous ulcer of ankle ([place]); Gastroduodenal ulcer; Rheumatoid arthritis ([place]); Acute non-ST elevation myocardial infarction (NSTEMI) ([place]); Chronic systolic heart failure ([place]); Pure hypercholesterolemia; HTN (hypertension); Lymphedema; Right shoulder pain; Health care maintenance; Atypical endometrial hyperplasia; Chronic pain disorder; and Complex endometrial hyperplasia with atypia on their problem list. Her primarily concern today is the Knee Pain (bilateral); Arm Pain (left ); Neck Pain (left); and Joint Pain (RA)  Pain Assessment: Location: Right, Left Knee(see visit info) Radiating: left arm pain goes into neck and down the arm.  Onset: More than a month ago Duration: Chronic pain Quality: Numbness, Aching, Constant, Throbbing, Tender, Discomfort, Sore, Tightness, Shooting, Radiating Severity: 10-Worst pain ever/10 (self-reported pain score)  Note: Reported level is compatible with observation.                         When using our objective Pain Scale, levels between 6 and 10/10 are said to belong in an emergency room, as it progressively worsens from a 6/10, described as severely limiting, requiring emergency care not  usually available at an outpatient pain management facility. At a 6/10 level, communication becomes difficult and requires great effort. Assistance to reach the emergency department may be required. Facial flushing and profuse sweating along with potentially dangerous increases in heart rate and blood pressure will be evident. Effect on ADL: continues to have pain in left arm and hand causing grip to feel different or weekened.  Timing: Constant Modifying factors: medication is not helping currently  [name] [name] was last scheduled for an appointment on 12/17/2017 for medication management. During today's appointment we reviewed [name] [name] chronic pain status, as well as her outpatient medication regimen.  Patient returns today for bilateral genicular nerve block.  She was unable to tolerate positioning for this procedure so procedure was aborted.  Patient also states that Mobic was not effective.  We will trial diclofenac 75 mg twice daily for her shoulder and knee osteoarthritis.  We will also schedule her for a left shoulder injection for shoulder osteoarthritis.  The patient  reports that she does not use drugs. Her body mass index is 48.35 kg/m.  Further details on both, my assessment(s), as well as the proposed treatment plan, please see below.  Controlled Substance Pharmacotherapy Assessment REMS (Risk Evaluation and Mitigation Strategy)  Analgesic: Percocet 10 mg 3 times daily as needed, quantity 90,  MME/day: 45 mg/day.   Janett Billow, RN  01/06/2018 11:38 AM  Sign at close encounter Safety precautions to be maintained throughout the outpatient stay will include: orient to surroundings, keep bed in low position, maintain call bell within reach at all times, provide assistance with transfer out of bed and ambulation.    Pharmacokinetics: Liberation and absorption (onset of action):  WNL Distribution (time to peak effect): WNL Metabolism and excretion (duration of action): WNL          Pharmacodynamics: Desired effects: Analgesia: [name] [name] reports >50% benefit. Functional ability: Patient reports that medication allows her to accomplish basic ADLs Clinically meaningful improvement in function (CMIF): Sustained CMIF goals met Perceived effectiveness: Described as relatively effective, allowing for increase in activities of daily living (ADL) Undesirable effects: Side-effects or Adverse reactions: None reported Monitoring: [place] PMP: Online review of the past 63-monthperiod conducted. Compliant with practice rules and regulations Last UDS on record: Summary  Date Value Ref Range Status  08/13/2017 FINAL  Final    Comment:    ==================================================================== TOXASSURE COMP DRUG ANALYSIS,UR ==================================================================== Test                             Result       Flag       Units Drug Present and Declared for Prescription Verification   Oxycodone                      2498         EXPECTED   ng/mg creat   Oxymorphone                    160          EXPECTED   ng/mg creat   Noroxycodone                   3393         EXPECTED   ng/mg creat    Sources of oxycodone include scheduled prescription medications.    Oxymorphone and noroxycodone are expected metabolites of    oxycodone. Oxymorphone is also available as a scheduled    prescription medication.   Gabapentin                     PRESENT      EXPECTED   Citalopram                     PRESENT      EXPECTED   Desmethylcitalopram            PRESENT      EXPECTED    Desmethylcitalopram is an expected metabolite of citalopram or    the enantiomeric form, escitalopram.   Acetaminophen                  PRESENT      EXPECTED Drug Absent but Declared for Prescription Verification   Tizanidine                     Not Detected UNEXPECTED    Tizanidine, as indicated in the declared medication list, is not    always detected even when used as  directed.   Salicylate                     Not Detected UNEXPECTED    Aspirin, as indicated in the declared medication list, is not    always detected even when used as directed. ==================================================================== Test                      Result    Flag   Units      Ref Range   Creatinine  94               mg/dL      >=20 ==================================================================== Declared Medications:  The flagging and interpretation on this report are based on the  following declared medications.  Unexpected results may arise from  inaccuracies in the declared medications.  **Note: The testing scope of this panel includes these medications:  Citalopram  Gabapentin  Oxycodone (Oxycodone Acetaminophen)  **Note: The testing scope of this panel does not include small to  moderate amounts of these reported medications:  Acetaminophen (Oxycodone Acetaminophen)  Aspirin (Aspirin 81)  Tizanidine  **Note: The testing scope of this panel does not include following  reported medications:  Carvedilol  Cetirizine  Furosemide  Glipizide  Losartan (Losartan Potassium)  Meloxicam  Prednisone ==================================================================== For clinical consultation, please call [phone]. ====================================================================    UDS interpretation: Compliant          Medication Assessment Form: Reviewed. Patient indicates being compliant with therapy Treatment compliance: Compliant Risk Assessment Profile: Aberrant behavior: See prior evaluations. None observed or detected today Comorbid factors increasing risk of overdose: See prior notes. No additional risks detected today Risk of substance use disorder (SUD): Low Opioid Risk Tool - 12/17/17 1106      Family History of Substance Abuse   Alcohol  Negative    Illegal Drugs  Negative    Rx Drugs  Negative      Personal  History of Substance Abuse   Alcohol  Negative    Illegal Drugs  Negative      Age   Age between 46-45 years   No      Psychological Disease   Psychological Disease  Negative    Depression  Negative      Total Score   Opioid Risk Tool Scoring  0    Opioid Risk Interpretation  Low Risk      ORT Scoring interpretation table:  Score <3 = Low Risk for SUD  Score between 4-7 = Moderate Risk for SUD  Score >8 = High Risk for Opioid Abuse   Risk Mitigation Strategies:  Patient Counseling: Covered Patient-Prescriber Agreement (PPA): Present and active  Notification to other healthcare providers: Done  Pharmacologic Plan: No change in therapy, at this time.             Laboratory Chemistry  Inflammation Markers (CRP: Acute Phase) (ESR: Chronic Phase) Lab Results  Component Value Date   LATICACIDVEN 1.4 09/05/2015                 Rheumatology Markers No results found for: RF, ANA, Therisa Doy[place], [facility]              Renal Function Markers Lab Results  Component Value Date   BUN 18 09/06/2017   CREATININE 0.68 09/06/2017   GFRAA >60 09/06/2017   GFRNONAA >60 09/06/2017                 Hepatic Function Markers Lab Results  Component Value Date   AST 18 07/18/2017   ALT 13 07/18/2017   ALBUMIN 4.0 07/18/2017   ALKPHOS 75 07/18/2017   LIPASE 20 (L) 09/04/2015                 Electrolytes Lab Results  Component Value Date   NA 138 09/06/2017   K 3.5 09/06/2017   CL 98 (L) 09/06/2017   CALCIUM 7.7 (L) 09/06/2017  Neuropathy Markers Lab Results  Component Value Date   HGBA1C 8.4 (H) 07/18/2017   HIV Non Reactive 07/18/2017                 Bone Pathology Markers No results found for: VD25OH, [id number], [id number], AT5573UK0, 25OHVITD1, 25OHVITD2, 25OHVITD3, TESTOFREE, TESTOSTERONE               Coagulation Parameters Lab Results  Component Value Date   INR 0.97 08/26/2017   LABPROT 12.8 08/26/2017   APTT 24 08/26/2017    PLT 301 09/04/2017                 Cardiovascular Markers Lab Results  Component Value Date   CKTOTAL 62 11/04/2013   CKMB 1.2 11/04/2013   TROPONINI 1.00 (H) 09/05/2015   HGB 9.6 (L) 09/04/2017   HCT 29.8 (L) 09/04/2017                 CA Markers No results found for: CEA, CA125, LABCA2               Note: Lab results reviewed.  Recent Diagnostic Imaging Results  DG Knee 1-2 Views Right CLINICAL DATA:  Knee pain  EXAM: RIGHT KNEE - 1-2 VIEW  COMPARISON:  09/02/2017  FINDINGS: No fracture or malalignment. Marked arthritis of the medial compartment of the knee with narrowing, spurring and sclerosis. Mild degenerative changes of the lateral and patellofemoral compartments. No large knee effusion.  IMPRESSION: Marked arthritis involving medial compartment of the right knee. No acute osseous abnormality.  Electronically Signed   By: Donavan Foil M.D.   On: 11/19/2017 15:45 DG Knee 1-2 Views Left CLINICAL DATA:  Bilateral knee pain for many years  EXAM: LEFT KNEE - 1-2 VIEW  COMPARISON:  CT 12/31/2013, radiograph 09/02/2013  FINDINGS: Interval left medial hemiarthroplasty with intact appearing hardware. Slight lateral tilt of the femoral prosthetic. Mild degenerative change of the lateral compartment and patellofemoral compartment. Trace knee effusion. Probable hardware defects in the proximal to midshaft of the tibia. Prominent spurring of the tibial spines.  IMPRESSION: 1. No acute osseous abnormality. 2. Interval medial hemiarthroplasty of the left knee 3. Mild degenerative changes of the lateral and patellofemoral compartments with small knee effusion.  Electronically Signed   By: Donavan Foil M.D.   On: 11/19/2017 15:44 DG HIP UNILAT W OR W/O PELVIS 2-3 VIEWS RIGHT CLINICAL DATA:  Hip pain for many years  EXAM: DG HIP (WITH OR WITHOUT PELVIS) 2-3V RIGHT  COMPARISON:  None.  FINDINGS: No fracture or malalignment. Pubic symphysis and rami  are intact. Moderate arthritis of the right hip with joint space narrowing, minimal sclerosis and osteophyte.  IMPRESSION: Moderate arthritis of the right hip.  No acute osseous abnormality.  Electronically Signed   By: Donavan Foil M.D.   On: 11/19/2017 15:40 DG HIP UNILAT W OR W/O PELVIS 2-3 VIEWS LEFT CLINICAL DATA:  Bilateral hip pain for many years  EXAM: DG HIP (WITH OR WITHOUT PELVIS) 2-3V LEFT  COMPARISON:  CT 09/05/2015  FINDINGS: Mild SI joint degenerative change. Pubic symphysis and rami are intact. Moderate arthritis of the left hip with joint space narrowing. No fracture or malalignment.  IMPRESSION: Moderate arthritis of the left hip. Mild SI joint degenerative changes.  Electronically Signed   By: Donavan Foil M.D.   On: 11/19/2017 15:39 DG Lumbar Spine Complete W/Bend CLINICAL DATA:  Chronic low back pain bilateral hip and knee pain for many years history of rheumatoid arthritis and  fibromyalgia  EXAM: LUMBAR SPINE - COMPLETE WITH BENDING VIEWS  COMPARISON:  CT 09/05/2015  FINDINGS: Five non rib bearing lumbar type vertebra. Vertebral body heights are normal. Mild anterior osteophytes of the lower thoracic spine. Mild degenerative changes at L2-L3. No significant change in alignment with flexion or extension.  IMPRESSION: Mild degenerative changes.  No acute osseous abnormality.  Electronically Signed   By: Donavan Foil M.D.   On: 11/19/2017 15:38  Complexity Note: Imaging results reviewed. Results shared with Ms. Mowrer, using Layman's terms.                         Meds   Current Outpatient Medications:  .  citalopram (CELEXA) 40 MG tablet, Take 40 mg by mouth daily., Disp: , Rfl: 3 .  gabapentin (NEURONTIN) 300 MG capsule, Take 2 capsules (600 mg total) by mouth 3 (three) times daily., Disp: 180 capsule, Rfl: 5 .  glipiZIDE (GLUCOTROL) 5 MG tablet, TAKE 1 TABLET BY MOUTH TWICE A DAY BEFORE MEALS, Disp: 60 tablet, Rfl: 1 .  glucose  blood (ACCU-CHEK AVIVA PLUS) test strip, To check blood sugar once a day.  DX: E11.9, Disp: 100 each, Rfl: 1 .  oxyCODONE-acetaminophen (PERCOCET) 10-325 MG tablet, Take 1 tablet by mouth every 8 (eight) hours as needed for pain. For chronic pain To fill on or after: 12/18/17, 01/17/18 To last for 30 days from fill date, Disp: 90 tablet, Rfl: 0 .  cetirizine (ZYRTEC) 10 MG tablet, TAKE 1 TABLET (10 MG TOTAL) BY MOUTH DAILY. (NOT COVER*) (Patient not taking: Reported on 01/06/2018), Disp: 30 tablet, Rfl: 2 .  diclofenac (VOLTAREN) 75 MG EC tablet, Take 1 tablet (75 mg total) by mouth 2 (two) times daily., Disp: 28 tablet, Rfl: 0 .  furosemide (LASIX) 20 MG tablet, TAKE 1 TABLET BY MOUTH EVERY DAY (Patient not taking: Reported on 01/06/2018), Disp: 90 tablet, Rfl: 1 .  methotrexate (RHEUMATREX) 2.5 MG tablet, Take 15 mg by mouth once a week. , Disp: , Rfl:   ROS  Constitutional: Denies any fever or chills Gastrointestinal: No reported hemesis, hematochezia, vomiting, or acute GI distress Musculoskeletal: Denies any acute onset joint swelling, redness, loss of ROM, or weakness Neurological: No reported episodes of acute onset apraxia, aphasia, dysarthria, agnosia, amnesia, paralysis, loss of coordination, or loss of consciousness  Allergies  Ms. Toure is allergic to latex; doxycycline; and sulfa antibiotics.  [place]  Drug: Ms. Greis  reports that she does not use drugs. Alcohol:  reports that she does not drink alcohol. Tobacco:  reports that she quit smoking about 21 years ago. She has a 29.00 pack-year smoking history. she has never used smokeless tobacco. Medical:  has a past medical history of Arthritis, BRCA negative (07/2017), Cancer ([place]), Cellulitis of both lower extremities, CHF (congestive heart failure) ([place]), Coronary artery disease, Diabetes mellitus without complication ([place]), Family history of breast cancer (07/2017), Family history of ovarian cancer, Fibromyalgia, Fibromyalgia affecting  shoulder region, Hyperlipidemia, Hypertension, and Spinal stenosis. Surgical: Ms. Shippee  has a past surgical history that includes Medial partial knee replacement (Left, 01/06/2014); Vascular Stent (11/04/2013); Anal fissurectomy (01/2004); Hernia repair; Umbilical hernia repair (N/A, 09/05/2015); Ablation saphenous vein w/ RFA; Coronary angioplasty; Dilation and curettage of uterus; Laparoscopic hysterectomy (N/A, 09/03/2017); and Cystoscopy (09/03/2017). Family: family history includes Aneurysm in her father; Breast cancer (age of onset: 46) in her sister; Breast cancer (age of onset: 69) in her sister; COPD in her father and sister; Cataracts in  her father and mother; Colon cancer in her paternal uncle and paternal uncle; Dementia in her father and mother; Diabetes in her father; Fibromyalgia in her sister; Heart attack in her brother; Hypertension in her mother and sister; Melanoma in her father; Migraines in her brother and sister; Ovarian cancer (age of onset: 3) in her sister; Thyroid disease in her mother; Uterine cancer in her cousin and cousin.  Constitutional Exam  General appearance: Well nourished, well developed, and well hydrated. In no apparent acute distress Vitals:   01/06/18 1135  BP: (!) 163/92  Pulse: 84  Resp: 16  Temp: 97.8 F (36.6 C)  TempSrc: Oral  SpO2: 94%  Weight: (!) 318 lb (144.2 kg)  Height: '5\' 8"'  (1.727 m)   BMI Assessment: Estimated body mass index is 48.35 kg/m as calculated from the following:   Height as of this encounter: '5\' 8"'  (1.727 m).   Weight as of this encounter: 318 lb (144.2 kg).  BMI interpretation table: BMI level Category Range association with higher incidence of chronic pain  <18 kg/m2 Underweight   18.5-24.9 kg/m2 Ideal body weight   25-29.9 kg/m2 Overweight Increased incidence by 20%  30-34.9 kg/m2 Obese (Class I) Increased incidence by 68%  35-39.9 kg/m2 Severe obesity (Class II) Increased incidence by 136%  >40 kg/m2 Extreme obesity  (Class [name]) Increased incidence by 254%   BMI Readings from Last 4 Encounters:  01/06/18 48.35 kg/m  12/17/17 48.66 kg/m  11/18/17 48.55 kg/m  11/14/17 49.11 kg/m   Wt Readings from Last 4 Encounters:  01/06/18 (!) 318 lb (144.2 kg)  12/17/17 (!) 320 lb (145.2 kg)  11/18/17 (!) 324 lb (147 kg)  11/14/17 (!) 323 lb (146.5 kg)  Psych/Mental status: Alert, oriented x 3 (person, place, & time)       Eyes: PERLA Respiratory: No evidence of acute respiratory distress  Cervical Spine Area Exam  Skin & Axial Inspection: No masses, redness, edema, swelling, or associated skin lesions Alignment: Symmetrical Functional ROM: Unrestricted ROM      Stability: No instability detected Muscle Tone/Strength: Functionally intact. No obvious neuro-muscular anomalies detected. Sensory (Neurological): Unimpaired Palpation: No palpable anomalies              Upper Extremity (UE) Exam    Side: Right upper extremity  Side: Left upper extremity  Skin & Extremity Inspection: Skin color, temperature, and hair growth are WNL. No peripheral edema or cyanosis. No masses, redness, swelling, asymmetry, or associated skin lesions. No contractures.  Skin & Extremity Inspection: Edema  Functional ROM: Unrestricted ROM          Functional ROM: Decreased ROM for shoulder  Muscle Tone/Strength: Functionally intact. No obvious neuro-muscular anomalies detected.  Muscle Tone/Strength: Functionally intact. No obvious neuro-muscular anomalies detected.  Sensory (Neurological): Unimpaired          Sensory (Neurological): Arthropathic arthralgia          Palpation: No palpable anomalies              Palpation: Complains of area being tender to palpation              Specialized Test(s): Deferred         Specialized Test(s): Deferred         Severely limited left shoulder abduction. Thoracic Spine Area Exam  Skin & Axial Inspection: No masses, redness, or swelling Alignment: Symmetrical Functional ROM: Unrestricted  ROM Stability: No instability detected Muscle Tone/Strength: Functionally intact. No obvious neuro-muscular anomalies detected. Sensory (Neurological):  Unimpaired Muscle strength & Tone: No palpable anomalies  Lumbar Spine Area Exam  Skin & Axial Inspection:No masses, redness, or swelling Alignment:Symmetrical Functional KAJ:[id number] ROM Stability:No instability detected Muscle Tone/Strength:Functionally intact. No obvious neuro-muscular anomalies detected. Sensory (Neurological):Unimpaired Palpation:No palpable anomalies Provocative Tests: Lumbar Hyperextension and rotation test:Positivebilaterally for facet joint pain. Lumbar Lateral bending test:Positivedue to pain. Patrick's Maneuver:Positivefor bilateral S-I arthralgia  Gait & Posture Assessment  Ambulation:Unassisted Gait:Relatively normal for age and body habitus Posture:WNL  Lower Extremity Exam    Side:Right lower extremity  Side:Left lower extremity  Skin & Extremity Inspection:Skin color, temperature, and hair growth are WNL. No peripheral edema or cyanosis. No masses, redness, swelling, asymmetry, or associated skin lesions. No contractures.  Skin & Extremity Inspection:Skin color, temperature, and hair growth are WNL. No peripheral edema or cyanosis. No masses, redness, swelling, asymmetry, or associated skin lesions. No contractures.  Functional DHR:[id number] restricted ROM  Functional EHO:[id number] restricted ROM  Muscle Tone/Strength:Functionally intact. No obvious neuro-muscular anomalies detected.  Muscle Tone/Strength:Functionally intact. No obvious neuro-muscular anomalies detected.  Sensory (Neurological):Unimpaired  Sensory (Neurological):Unimpaired  Palpation: Tender to palpation of bilateral patellas  Palpation:Tender to palpation of bilateral patellas    Assessment  Primary Diagnosis & Pertinent Problem List: The  primary encounter diagnosis was Primary osteoarthritis of left shoulder. Diagnoses of Primary osteoarthritis of both knees, Fibromyalgia, Spinal stenosis, lumbar region, with neurogenic claudication, Lumbar facet arthropathy, and Chronic pain syndrome were also pertinent to this visit.  Status Diagnosis  Having a Flare-up Persistent Persistent 1. Primary osteoarthritis of left shoulder   2. Primary osteoarthritis of both knees   3. Fibromyalgia   4. Spinal stenosis, lumbar region, with neurogenic claudication   5. Lumbar facet arthropathy   6. Chronic pain syndrome      General Recommendations: The pain condition that the patient suffers from is best treated with a multidisciplinary approach that involves an increase in physical activity to prevent de-conditioning and worsening of the pain cycle, as well as psychological counseling (formal and/or informal) to address the co-morbid psychological affects of pain. Treatment will often involve judicious use of pain medications and interventional procedures to decrease the pain, allowing the patient to participate in the physical activity that will ultimately produce long-lasting pain reductions. The goal of the multidisciplinary approach is to return the patient to a higher level of overall function and to restore their ability to perform activities of daily living.  67 year old female with a history of morbid obesity, depression, type 2 diabetes, GI ulcer, hypertension who presents with diffuse body pain including arthralgias of multiple joints secondary to fibromyalgia and low back pain secondary to lumbar degenerative disc disease, lumbar spinal stenosis,and bilateral knee pain secondary to knee osteoarthritis and morbid obesity. Patient has been on chronic opioid therapy for more than 10 years.  Of note patient's husband has metastatic cancer and this is been a huge concern for the patient as she is his primary caregiver.  The patient continues to  endorse severe left shoulder pain and swelling.   Patient was scheduled today for bilateral genicular nerve block.  She was unable to tolerate positioning on her fluoroscopy stable due to significant discomfort.  Procedure as a result was aborted.  Patient finds meloxicam not very effective in treating her shoulder osteoarthritis and knee osteoarthritis symptoms.  We will trial diclofenac 75 mg twice daily and have her discontinue Mobic.  Furthermore we will schedule her for left shoulder joint injection for left shoulder osteoarthritis.   Plan of Care  Pharmacotherapy (Medications  Ordered):  Requested Prescriptions   Signed Prescriptions Disp Refills  . diclofenac (VOLTAREN) 75 MG EC tablet 28 tablet 0    Sig: Take 1 tablet (75 mg total) by mouth 2 (two) times daily.   Lab-work, procedure(s), and/or referral(s): Orders Placed This Encounter  Procedures  . SHOULDER INJECTION     Provider-requested follow-up: Return in about 2 weeks (around 01/20/2018) for Procedure.  Future Appointments  Date Time Provider [place]  01/13/2018  1:30 PM Virginia Crews, MD [id number] None  01/22/2018 10:30 AM Gillis Santa, MD [facility] None    Primary Care Physician: Virginia Crews, MD Location: [facility] Outpatient Pain Management Facility Note by: Gillis Santa, M.D Date: 01/06/2018; Time: 12:47 PM  Patient Instructions  1. Stop Mobic 2. Try Diclofenac 3. Schedule for left shoulder joint injection

## 2018-01-06 NOTE — Patient Instructions (Addendum)
1. Stop Mobic 2. Try Diclofenac 3. Schedule for left shoulder joint injection

## 2018-01-06 NOTE — Progress Notes (Signed)
Safety precautions to be maintained throughout the outpatient stay will include: orient to surroundings, keep bed in low position, maintain call bell within reach at all times, provide assistance with transfer out of bed and ambulation.  

## 2018-01-06 NOTE — Progress Notes (Deleted)
Patient's Name: [name] [name]  MRN: [id number]  Referring Provider: Gillis Santa, MD  DOB: 05/23/1951  PCP: Virginia Crews, MD  DOS: 01/06/2018  Note by: Gillis Santa, MD  Service setting: Ambulatory outpatient  Specialty: Interventional Pain Management  Patient type: Established  Location: [facility] (AMB) Pain Management Facility  Visit type: Interventional Procedure   Primary Reason for Visit: Interventional Pain Management Treatment. CC: Knee Pain (bilateral); Arm Pain (left ); Neck Pain (left); and Joint Pain (RA)  Procedure:  Anesthesia, Analgesia, Anxiolysis:  Type: Therapeutic Superior-lateral, Superior-medial, and Inferior-medial, Genicular Nerves Block. (CPT [phone]) Region: Lateral, Anterior, and Medial aspects of the knee joint, above and below the knee joint proper. Level: Superior and inferior to the knee joint. Laterality: Bilateral  Type: Local Anesthesia Local Anesthetic: Lidocaine 1% Route: Infiltration ([place]/IM) IV Access: Secured Sedation: Meaningful verbal contact was maintained at all times during the procedure  Indication(s): Analgesia and Anxiety   Indications: 1. Primary osteoarthritis of both knees    Pain Score: Pre-procedure: 10-Worst pain ever/10 Post-procedure: 10-Worst pain ever/10  Pre-op Assessment:  Ms. [name] is a 67 y.o. (year old), female patient, seen today for interventional treatment. 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 Ms. Dise has a current medication list which includes the following prescription(s): citalopram, gabapentin, glipizide, glucose blood, oxycodone-acetaminophen, cetirizine, diclofenac, furosemide, and  methotrexate, and the following Facility-Administered Medications: dexamethasone, lidocaine (pf), and ropivacaine (pf) 2 mg/ml (0.2%). Her primarily concern today is the Knee Pain (bilateral); Arm Pain (left ); Neck Pain (left); and Joint Pain (RA)  Initial Vital Signs:  Pulse Rate: 84 Temp: 97.8 F (36.6 C) Resp: 16 BP: (!) 163/92 SpO2: 94 %  BMI: Estimated body mass index is 48.35 kg/m as calculated from the following:   Height as of this encounter: 5\' 8"  (1.727 m).   Weight as of this encounter: 318 lb (144.2 kg).  Risk Assessment: Allergies: Reviewed. She is allergic to latex; doxycycline; and sulfa antibiotics.  Allergy Precautions: None required Coagulopathies: Reviewed. None identified.  Blood-thinner therapy: None at this time Active Infection(s): Reviewed. None identified. Ms. Martorelli is afebrile  Site Confirmation: Ms. Thinnes was asked to confirm the procedure and laterality before marking the site Procedure checklist: Completed Consent: Before the procedure and under the influence of no sedative(s), amnesic(s), or anxiolytics, the patient was informed of the treatment options, risks and possible complications. To fulfill our ethical and legal obligations, as recommended by the American Medical Association's Code of Ethics, I have informed the patient of my clinical impression; the nature and purpose of the treatment or procedure; the risks, benefits, and possible complications of the intervention; the alternatives, including doing nothing; the risk(s) and benefit(s) of the alternative treatment(s) or procedure(s); and the risk(s) and benefit(s) of doing nothing. The patient was provided information about the general risks and possible complications associated with the procedure. These may include, but are not limited to: failure to achieve desired goals, infection, bleeding, organ or nerve damage, allergic reactions, paralysis, and death. In addition, the patient was informed of  those risks and complications associated to the procedure, such as failure to decrease pain; infection; bleeding; organ or nerve damage with subsequent damage to sensory, motor, and/or autonomic systems, resulting in permanent pain, numbness, and/or weakness of one  or several areas of the body; allergic reactions; (i.e.: anaphylactic reaction); and/or death. Furthermore, the patient was informed of those risks and complications associated with the medications. These include, but are not limited to: allergic reactions (i.e.: anaphylactic or anaphylactoid reaction(s)); adrenal axis suppression; blood sugar elevation that in diabetics may result in ketoacidosis or comma; water retention that in patients with history of congestive heart failure may result in shortness of breath, pulmonary edema, and decompensation with resultant heart failure; weight gain; swelling or edema; medication-induced neural toxicity; particulate matter embolism and blood vessel occlusion with resultant organ, and/or nervous system infarction; and/or aseptic necrosis of one or more joints. Finally, the patient was informed that Medicine is not an exact science; therefore, there is also the possibility of unforeseen or unpredictable risks and/or possible complications that may result in a catastrophic outcome. The patient indicated having understood very clearly. We have given the patient no guarantees and we have made no promises. Enough time was given to the patient to ask questions, all of which were answered to the patient's satisfaction. Ms. Laperle has indicated that she wanted to continue with the procedure. Attestation: I, the ordering provider, attest that I have discussed with the patient the benefits, risks, side-effects, alternatives, likelihood of achieving goals, and potential problems during recovery for the procedure that I have provided informed consent. Date: 01/06/2018; Time: 11:48 AM  Pre-Procedure Preparation:    Monitoring: As per clinic protocol. Respiration, ETCO2, SpO2, BP, heart rate and rhythm monitor placed and checked for adequate function Safety Precautions: Patient was assessed for positional comfort and pressure points before starting the procedure. Time-out: I initiated and conducted the "Time-out" before starting the procedure, as per protocol. The patient was asked to participate by confirming the accuracy of the "Time Out" information. Verification of the correct person, site, and procedure were performed and confirmed by me, the nursing staff, and the patient. "Time-out" conducted as per Joint Commission's Universal Protocol (UP.01.01.01). "Time-out" Date & Time: 01/06/2018;   hrs.  Description of Procedure Process:  Position: Supine Target Area: For Genicular Nerve block(s), the targets are: the superior-lateral genicular nerve, located in the lateral distal portion of the femoral shaft as it curves to form the lateral epicondyle, in the region of the distal femoral metaphysis; the superior-medial genicular nerve, located in the medial distal portion of the femoral shaft as it curves to form the medial epicondyle; and the inferior-medial genicular nerve, located in the medial, proximal portion of the tibial shaft, as it curves to form the medial epicondyle, in the region of the proximal tibial metaphysis. Approach: Anterior, ipsilateral approach. Area Prepped: Entire knee area, from mid-thigh to mid-shin, lateral, anterior, and medial aspects. Prepping solution: ChloraPrep (2% chlorhexidine gluconate and 70% isopropyl alcohol) Safety Precautions: Aspiration looking for blood return was conducted prior to all injections. At no point did we inject any substances, as a needle was being advanced. No attempts were made at seeking any paresthesias. Safe injection practices and needle disposal techniques used. Medications properly checked for expiration dates. SDV (single dose vial) medications used.  Latex Allergy precautions taken.   Description of the Procedure: Protocol guidelines were followed. The patient was placed in position over the procedure table. The target area was identified and the area prepped in the usual manner. Skin desensitized using vapocoolant spray. Skin & deeper tissues infiltrated with local anesthetic. Appropriate amount of time allowed to pass for local anesthetics to take effect. The procedure needles were then advanced to the target area.  Proper needle placement secured. Negative aspiration confirmed. Solution injected in intermittent fashion, asking for systemic symptoms every 0.5cc of injectate. The needles were then removed and the area cleansed, making sure to leave some of the prepping solution back to take advantage of its long term bactericidal properties.  Vitals:   01/06/18 1135  BP: (!) 163/92  Pulse: 84  Resp: 16  Temp: 97.8 F (36.6 C)  TempSrc: Oral  SpO2: 94%  Weight: (!) 318 lb (144.2 kg)  Height: 5\' 8"  (1.727 m)    Start Time:   hrs. End Time:   hrs. Materials:  Needle(s) Type: Regular needle Gauge: 22G Length: 3.5-in Medication(s): [name] "[name]" had no medications administered during this visit. Please see chart orders for dosing details.  Imaging Guidance (Non-Spinal):  Type of Imaging Technique: Fluoroscopy Guidance (Non-Spinal) Indication(s): Assistance in needle guidance and placement for procedures requiring needle placement in or near specific anatomical locations not easily accessible without such assistance. Exposure Time: Please see nurses notes. Contrast: Before injecting any contrast, we confirmed that the patient did not have an allergy to iodine, shellfish, or radiological contrast. Once satisfactory needle placement was completed at the desired level, radiological contrast was injected. Contrast injected under live fluoroscopy. No contrast complications. See chart for type and volume of contrast used. Fluoroscopic  Guidance: I was personally present during the use of fluoroscopy. "Tunnel Vision Technique" used to obtain the best possible view of the target area. Parallax error corrected before commencing the procedure. "Direction-depth-direction" technique used to introduce the needle under continuous pulsed fluoroscopy. Once target was reached, antero-posterior, oblique, and lateral fluoroscopic projection used confirm needle placement in all planes. Images permanently stored in EMR. Interpretation: I personally interpreted the imaging intraoperatively. Adequate needle placement confirmed in multiple planes. Appropriate spread of contrast into desired area was observed. No evidence of afferent or efferent intravascular uptake. Permanent images saved into the patient's record.  Antibiotic Prophylaxis:  Indication(s): None identified Antibiotic given: None  Post-operative Assessment:  Post-procedure Vital Signs:  Pulse Rate: 84 Temp: 97.8 F (36.6 C) ECG Heart Rate:   Resp: 16 BP: (!) 163/92 SpO2: 94 % ETCO2:    EBL: None  Complications: No immediate post-treatment complications observed by team, or reported by patient.  Note: The patient tolerated the entire procedure well. A repeat set of vitals were taken after the procedure and the patient was kept under observation following institutional policy, for this type of procedure. Post-procedural neurological assessment was performed, showing return to baseline, prior to discharge. The patient was provided with post-procedure discharge instructions, including a section on how to identify potential problems. Should any problems arise concerning this procedure, the patient was given instructions to immediately contact us, at any time, without hesitation. In any case, we plan to contact the patient by telephone for a follow-up status report regarding this interventional procedure.  Comments:  No additional relevant information.  Plan of Care   Imaging  Orders     DG C-Arm 1-60 Min-No Report Procedure Orders    No procedure(s) ordered today    Medications ordered for procedure: Meds ordered this encounter  Medications  . lidocaine (PF) (XYLOCAINE) 1 % injection 10 mL  . ropivacaine (PF) 2 mg/mL (0.2%) (NAROPIN) injection 10 mL  . dexamethasone (DECADRON) injection 10 mg  . diclofenac (VOLTAREN) 75 MG EC tablet    Sig: Take 1 tablet (75 mg total) by mouth 2 (two) times daily.    Dispense:  28 tablet  Refill:  0   Medications administered: [name] "[name]" had no medications administered during this visit.  See the medical record for exact dosing, route, and time of administration.  New Prescriptions   DICLOFENAC (VOLTAREN) 75 MG EC TABLET    Take 1 tablet (75 mg total) by mouth 2 (two) times daily.   Disposition: Discharge home  Discharge Date & Time: 01/06/2018;   hrs.   Physician-requested Follow-up: No Follow-up on file.  Future Appointments  Date Time Provider [place]  01/13/2018  1:30 PM Bacigalupo, Dionne Bucy, MD [facility] None   Primary Care Physician: Virginia Crews, MD Location: [facility] Outpatient Pain Management Facility Note by: Gillis Santa, MD Date: 01/06/2018; Time: 11:49 AM  Disclaimer:  Medicine is not an exact science. The only guarantee in medicine is that nothing is guaranteed. It is important to note that the decision to proceed with this intervention was based on the information collected from the patient. The Data and conclusions were drawn from the patient's questionnaire, the interview, and the physical examination. Because the information was provided in large part by the patient, it cannot be guaranteed that it has not been purposely or unconsciously manipulated. Every effort has been made to obtain as much relevant data as possible for this evaluation. It is important to note that the conclusions that lead to this procedure are derived in large part from the available data. Always take  into account that the treatment will also be dependent on availability of resources and existing treatment guidelines, considered by other Pain Management Practitioners as being common knowledge and practice, at the time of the intervention. For Medico-Legal purposes, it is also important to point out that variation in procedural techniques and pharmacological choices are the acceptable norm. The indications, contraindications, technique, and results of the above procedure should only be interpreted and judged by a Board-Certified Interventional Pain Specialist with extensive familiarity and expertise in the same exact procedure and technique.

## 2018-01-07 ENCOUNTER — Telehealth: Payer: Self-pay | Admitting: *Deleted

## 2018-01-07 NOTE — Telephone Encounter (Signed)
Attemtped to call for post procedure follow-up. Unable to leave a message.

## 2018-01-13 ENCOUNTER — Ambulatory Visit: Payer: Self-pay | Admitting: Family Medicine

## 2018-01-14 ENCOUNTER — Ambulatory Visit: Payer: BLUE CROSS/BLUE SHIELD | Admitting: Family Medicine

## 2018-01-22 ENCOUNTER — Encounter: Payer: Self-pay | Admitting: Student in an Organized Health Care Education/Training Program

## 2018-01-22 ENCOUNTER — Other Ambulatory Visit: Payer: Self-pay

## 2018-01-22 ENCOUNTER — Ambulatory Visit
Payer: BLUE CROSS/BLUE SHIELD | Attending: Student in an Organized Health Care Education/Training Program | Admitting: Student in an Organized Health Care Education/Training Program

## 2018-01-22 VITALS — BP 145/80 | HR 82 | Temp 98.7°F | Resp 18 | Ht 68.0 in | Wt 318.0 lb

## 2018-01-22 DIAGNOSIS — M79622 Pain in left upper arm: Secondary | ICD-10-CM | POA: Diagnosis present

## 2018-01-22 DIAGNOSIS — M25512 Pain in left shoulder: Secondary | ICD-10-CM | POA: Insufficient documentation

## 2018-01-22 DIAGNOSIS — Z881 Allergy status to other antibiotic agents status: Secondary | ICD-10-CM | POA: Insufficient documentation

## 2018-01-22 DIAGNOSIS — M19012 Primary osteoarthritis, left shoulder: Secondary | ICD-10-CM

## 2018-01-22 DIAGNOSIS — M79642 Pain in left hand: Secondary | ICD-10-CM | POA: Diagnosis present

## 2018-01-22 DIAGNOSIS — Z96652 Presence of left artificial knee joint: Secondary | ICD-10-CM | POA: Insufficient documentation

## 2018-01-22 DIAGNOSIS — M542 Cervicalgia: Secondary | ICD-10-CM | POA: Diagnosis present

## 2018-01-22 DIAGNOSIS — Z882 Allergy status to sulfonamides status: Secondary | ICD-10-CM | POA: Diagnosis not present

## 2018-01-22 MED ORDER — DEXAMETHASONE SODIUM PHOSPHATE 10 MG/ML IJ SOLN
10.0000 mg | Freq: Once | INTRAMUSCULAR | Status: DC
  Filled 2018-01-22: qty 1

## 2018-01-22 MED ORDER — ROPIVACAINE HCL 2 MG/ML IJ SOLN
10.0000 mL | Freq: Once | INTRAMUSCULAR | Status: DC
  Filled 2018-01-22: qty 10

## 2018-01-22 MED ORDER — LIDOCAINE HCL (PF) 1 % IJ SOLN
INTRAMUSCULAR | Status: AC
  Filled 2018-01-22: qty 5

## 2018-01-22 MED ORDER — LIDOCAINE HCL 1 % IJ SOLN
10.0000 mL | Freq: Once | INTRAMUSCULAR | Status: DC
  Filled 2018-01-22: qty 10

## 2018-01-22 NOTE — Progress Notes (Signed)
Patient's Name: [name] [name]  MRN: [id number]  Referring Provider: Virginia Crews, MD  DOB: 1951-07-22  PCP: Virginia Crews, MD  DOS: 01/22/2018  Note by: Gillis Santa, MD  Service setting: Ambulatory outpatient  Specialty: Interventional Pain Management  Patient type: Established  Location: [facility] (AMB) Pain Management Facility  Visit type: Interventional Procedure   Primary Reason for Visit: Interventional Pain Management Treatment. CC: Arm Pain (left, upper and lower through to hand)  Procedure:  Anesthesia, Analgesia, Anxiolysis:  Type: Diagnostic Glenohumeral Joint (shoulder) Injection Region: Posterolateral Shoulder Area Level: Shoulder Laterality: Left-Sided  Type: Local Anesthesia Local Anesthetic: Lidocaine 1% Route: Infiltration ([place]/IM) IV Access: Declined Sedation: Meaningful verbal contact was maintained at all times during the procedure  Indication(s): Analgesia and Anxiety   Indications: 1. Primary osteoarthritis of left shoulder   2. Cervicalgia    Pain Score: Pre-procedure: 7 /10 Post-procedure: 0-No pain/10  Pre-op Assessment:  [name] [name] is a 67 y.o. (year old), female patient, seen today for interventional treatment. 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 [name] [name] has a current medication list which includes the following prescription(s): cetirizine, citalopram, diclofenac, furosemide, gabapentin, glipizide, glucose blood, methotrexate, and oxycodone-acetaminophen, and the following Facility-Administered Medications: dexamethasone, lidocaine, and ropivacaine (pf) 2 mg/ml (0.2%). Her primarily concern today is the Arm Pain (left,  upper and lower through to hand)  Initial Vital Signs: Blood pressure (!) 151/74, pulse 71, temperature 98.3 F (36.8 C), resp. rate 16, height 5' 8.5" (1.74 m), weight (!) 324 lb (147 kg), SpO2 97 %. BMI: Estimated body mass index is 48.35 kg/m as calculated from the following:   Height as of this encounter: 5\' 8"  (1.727 m).   Weight as of this encounter: 318 lb (144.2 kg).  Risk Assessment: Allergies: Reviewed. She is allergic to latex; doxycycline; and sulfa antibiotics.  Allergy Precautions: None required Coagulopathies: Reviewed. None identified.  Blood-thinner therapy: None at this time Active Infection(s): Reviewed. None identified. [name] [name] is afebrile  Site Confirmation: [name] [name] was asked to confirm the procedure and laterality before marking the site Procedure checklist: Completed Consent: Before the procedure and under the influence of no sedative(s), amnesic(s), or anxiolytics, the patient was informed of the treatment options, risks and possible complications. To fulfill our ethical and legal obligations, as recommended by the American Medical Association's Code of Ethics, I have informed the patient of my clinical impression; the nature and purpose of the treatment or procedure; the risks, benefits, and possible complications of the intervention; the alternatives, including doing nothing; the risk(s) and benefit(s) of the alternative treatment(s) or procedure(s); and the risk(s) and benefit(s) of doing nothing. The patient was provided information about the general risks and possible complications associated with the procedure. These may include, but are not limited to: failure to achieve desired goals, infection, bleeding, organ or nerve damage, allergic reactions, paralysis, and death. In addition, the patient was informed of those risks and complications associated to the procedure, such as failure to decrease pain; infection; bleeding; organ or nerve damage with subsequent  damage to sensory, motor, and/or autonomic systems, resulting in permanent pain, numbness, and/or weakness of one or several areas of the body; allergic reactions; (i.e.: anaphylactic reaction); and/or death. Furthermore, the patient was informed of those risks and  complications associated with the medications. These include, but are not limited to: allergic reactions (i.e.: anaphylactic or anaphylactoid reaction(s)); adrenal axis suppression; blood sugar elevation that in diabetics may result in ketoacidosis or comma; water retention that in patients with history of congestive heart failure may result in shortness of breath, pulmonary edema, and decompensation with resultant heart failure; weight gain; swelling or edema; medication-induced neural toxicity; particulate matter embolism and blood vessel occlusion with resultant organ, and/or nervous system infarction; and/or aseptic necrosis of one or more joints. Finally, the patient was informed that Medicine is not an exact science; therefore, there is also the possibility of unforeseen or unpredictable risks and/or possible complications that may result in a catastrophic outcome. The patient indicated having understood very clearly. We have given the patient no guarantees and we have made no promises. Enough time was given to the patient to ask questions, all of which were answered to the patient's satisfaction. [name] [name] has indicated that she wanted to continue with the procedure. Attestation: I, the ordering provider, attest that I have discussed with the patient the benefits, risks, side-effects, alternatives, likelihood of achieving goals, and potential problems during recovery for the procedure that I have provided informed consent. Date: 01/22/2018; Time: 2:43 PM  Pre-Procedure Preparation:  Monitoring: As per clinic protocol. Respiration, ETCO2, SpO2, BP, heart rate and rhythm monitor placed and checked for adequate function Safety Precautions:  Patient was assessed for positional comfort and pressure points before starting the procedure. Time-out: I initiated and conducted the "Time-out" before starting the procedure, as per protocol. The patient was asked to participate by confirming the accuracy of the "Time Out" information. Verification of the correct person, site, and procedure were performed and confirmed by me, the nursing staff, and the patient. "Time-out" conducted as per Joint Commission's Universal Protocol (UP.01.01.01). "Time-out" Date & Time: 01/22/2018; 1111 hrs.  Description of Procedure Process:   Position: Sitting Target Area: Glenohumeral Joint (shoulder) Approach: Posterior approach. Area Prepped: Entire shoulder Area Prepping solution: ChloraPrep (2% chlorhexidine gluconate and 70% isopropyl alcohol) Safety Precautions: Aspiration looking for blood return was conducted prior to all injections. At no point did we inject any substances, as a needle was being advanced. No attempts were made at seeking any paresthesias. Safe injection practices and needle disposal techniques used. Medications properly checked for expiration dates. SDV (single dose vial) medications used. Description of the Procedure: Protocol guidelines were followed. The patient was placed in position over the procedure table. The target area was identified and the area prepped in the usual manner. Skin & deeper tissues infiltrated with local anesthetic. Appropriate amount of time allowed to pass for local anesthetics to take effect. The procedure needles were then advanced to the target area. Proper needle placement secured. Negative aspiration confirmed. Solution injected in intermittent fashion, asking for systemic symptoms every 0.5cc of injectate. The needles were then removed and the area cleansed, making sure to leave some of the prepping solution back to take advantage of its long term bactericidal properties. Vitals:   01/22/18 1050 01/22/18 1110  01/22/18 1123  BP: (!) 149/78 (!) 141/85 (!) 145/80  Pulse: 82    Resp: 18 18 18   Temp: 98.7 F (37.1 C)    TempSrc: Oral    SpO2: 96% 97% 99%  Weight: (!) 318 lb (144.2 kg)    Height: 5\' 8"  (1.727 m)      Start Time: 1111 hrs. End Time: 1117 hrs. Materials:  Needle(s) Type: Regular needle Gauge: 22G Length: 3.5-in  Medication(s):  Please see chart orders for dosing details. 5 cc solution made of 4 cc of 0.2% ropivacaine and 1 cc of Decadron 10 mg/cc.  5 cc injected into the posterior glenohumeral joint. Imaging Guidance (Non-Spinal):  Type of Imaging Technique: Fluoroscopy Guidance (Non-Spinal) Indication(s): Assistance in needle guidance and placement for procedures requiring needle placement in or near specific anatomical locations not easily accessible without such assistance. Exposure Time: Please see nurses notes. Contrast: None used. Fluoroscopic Guidance: I was personally present during the use of fluoroscopy. "Tunnel Vision Technique" used to obtain the best possible view of the target area. Parallax error corrected before commencing the procedure. "Direction-depth-direction" technique used to introduce the needle under continuous pulsed fluoroscopy. Once target was reached, antero-posterior, oblique, and lateral fluoroscopic projection used confirm needle placement in all planes. Images permanently stored in EMR. Interpretation: No contrast injected. I personally interpreted the imaging intraoperatively. Adequate needle placement confirmed in multiple planes. Permanent images saved into the patient's record.  Antibiotic Prophylaxis:  Indication(s): None identified Antibiotic given: None  Post-operative Assessment:  EBL: None Complications: No immediate post-treatment complications observed by team, or reported by patient. Note: The patient tolerated the entire procedure well. A repeat set of vitals were taken after the procedure and the patient was kept under observation  following institutional policy, for this type of procedure. Post-procedural neurological assessment was performed, showing return to baseline, prior to discharge. The patient was provided with post-procedure discharge instructions, including a section on how to identify potential problems. Should any problems arise concerning this procedure, the patient was given instructions to immediately contact us, at any time, without hesitation. In any case, we plan to contact the patient by telephone for a follow-up status report regarding this interventional procedure. Comments:  No additional relevant information.  Plan of Care  Patient also endorsing left arm weakness and issues with fine motor control.  She finds it very difficult to hold utensils at times.  Will obtain CT of cervical spine versus x-ray of left shoulder.  Medications ordered for procedure: Meds ordered this encounter  Medications  . lidocaine (XYLOCAINE) 1 % (with pres) injection 10 mL  . ropivacaine (PF) 2 mg/mL (0.2%) (NAROPIN) injection 10 mL  . dexamethasone (DECADRON) injection 10 mg   Medications administered: [name] "[name]" had no medications administered during this visit.  See the medical record for exact dosing, route, and time of administration.  New Prescriptions   No medications on file   Disposition: Discharge home  Discharge Date & Time: 01/22/2018; 1125 hrs.  Orders Placed This Encounter  Procedures  . CT CERVICAL SPINE WO CONTRAST    Standing Status:   Future    Standing Expiration Date:   04/21/2018    Order Specific Question:   Preferred imaging location?    Answer:   [facility]    Order Specific Question:   Call Results- Best Contact Number?    Answer:   [phone]) [phone] ([facility])    Order Specific Question:   Radiology Contrast Protocol - do NOT remove file path    Answer:   [email or web address]  . DG Shoulder Left    Standing Status:   Future     Standing Expiration Date:   03/24/2019    Order Specific Question:   Reason for Exam (SYMPTOM  OR DIAGNOSIS REQUIRED)    Answer:   Left shoulder pain    Order Specific Question:   Preferred imaging location?    Answer:   [facility]  Order Specific Question:   Call Results- Best Contact Number?    Answer:   [phone]     Physician-requested Follow-up: Return in about 3 weeks (around 02/11/2018) for Medication Management, Post Procedure Evaluation. Future Appointments  Date Time Provider [place]  02/10/2018  9:15 AM Gillis Santa, MD [facility] None   Primary Care Physician: Virginia Crews, MD Location: [facility] Note by: Gillis Santa, MD Date: 01/22/2018; Time: 11:42 AM  Disclaimer:  Medicine is not an exact science. The only guarantee in medicine is that nothing is guaranteed. It is important to note that the decision to proceed with this intervention was based on the information collected from the patient. The Data and conclusions were drawn from the patient's questionnaire, the interview, and the physical examination. Because the information was provided in large part by the patient, it cannot be guaranteed that it has not been purposely or unconsciously manipulated. Every effort has been made to obtain as much relevant data as possible for this evaluation. It is important to note that the conclusions that lead to this procedure are derived in large part from the available data. Always take into account that the treatment will also be dependent on availability of resources and existing treatment guidelines, considered by other Pain Management Practitioners as being common knowledge and practice, at the time of the intervention. For Medico-Legal purposes, it is also important to point out that variation in procedural techniques and pharmacological choices are the acceptable norm. The indications, contraindications, technique, and results of  the above procedure should only be interpreted and judged by a Board-Certified Interventional Pain Specialist with extensive familiarity and expertise in the same exact procedure and technique.

## 2018-01-22 NOTE — Progress Notes (Signed)
Safety precautions to be maintained throughout the outpatient stay will include: orient to surroundings, keep bed in low position, maintain call bell within reach at all times, provide assistance with transfer out of bed and ambulation.  

## 2018-01-22 NOTE — Patient Instructions (Signed)

## 2018-01-23 ENCOUNTER — Telehealth: Payer: Self-pay

## 2018-01-23 NOTE — Telephone Encounter (Signed)
Denies any needs at this time. Instructed to call if needed. 

## 2018-02-10 ENCOUNTER — Ambulatory Visit
Payer: BLUE CROSS/BLUE SHIELD | Attending: Student in an Organized Health Care Education/Training Program | Admitting: Student in an Organized Health Care Education/Training Program

## 2018-02-10 ENCOUNTER — Encounter: Payer: Self-pay | Admitting: Student in an Organized Health Care Education/Training Program

## 2018-02-10 VITALS — BP 160/83 | HR 87 | Temp 98.0°F | Resp 16 | Ht 68.5 in | Wt 318.0 lb

## 2018-02-10 DIAGNOSIS — I110 Hypertensive heart disease with heart failure: Secondary | ICD-10-CM | POA: Insufficient documentation

## 2018-02-10 DIAGNOSIS — M4698 Unspecified inflammatory spondylopathy, sacral and sacrococcygeal region: Secondary | ICD-10-CM | POA: Diagnosis not present

## 2018-02-10 DIAGNOSIS — M170 Bilateral primary osteoarthritis of knee: Secondary | ICD-10-CM | POA: Diagnosis not present

## 2018-02-10 DIAGNOSIS — Z79891 Long term (current) use of opiate analgesic: Secondary | ICD-10-CM | POA: Diagnosis not present

## 2018-02-10 DIAGNOSIS — M19012 Primary osteoarthritis, left shoulder: Secondary | ICD-10-CM | POA: Diagnosis not present

## 2018-02-10 DIAGNOSIS — Z79899 Other long term (current) drug therapy: Secondary | ICD-10-CM | POA: Insufficient documentation

## 2018-02-10 DIAGNOSIS — Z8041 Family history of malignant neoplasm of ovary: Secondary | ICD-10-CM | POA: Insufficient documentation

## 2018-02-10 DIAGNOSIS — Z833 Family history of diabetes mellitus: Secondary | ICD-10-CM | POA: Insufficient documentation

## 2018-02-10 DIAGNOSIS — I872 Venous insufficiency (chronic) (peripheral): Secondary | ICD-10-CM | POA: Diagnosis not present

## 2018-02-10 DIAGNOSIS — M1611 Unilateral primary osteoarthritis, right hip: Secondary | ICD-10-CM | POA: Diagnosis not present

## 2018-02-10 DIAGNOSIS — J309 Allergic rhinitis, unspecified: Secondary | ICD-10-CM | POA: Diagnosis not present

## 2018-02-10 DIAGNOSIS — G894 Chronic pain syndrome: Secondary | ICD-10-CM

## 2018-02-10 DIAGNOSIS — Z8711 Personal history of peptic ulcer disease: Secondary | ICD-10-CM | POA: Diagnosis not present

## 2018-02-10 DIAGNOSIS — I2510 Atherosclerotic heart disease of native coronary artery without angina pectoris: Secondary | ICD-10-CM | POA: Diagnosis not present

## 2018-02-10 DIAGNOSIS — M48062 Spinal stenosis, lumbar region with neurogenic claudication: Secondary | ICD-10-CM

## 2018-02-10 DIAGNOSIS — F329 Major depressive disorder, single episode, unspecified: Secondary | ICD-10-CM | POA: Diagnosis not present

## 2018-02-10 DIAGNOSIS — M51369 Other intervertebral disc degeneration, lumbar region without mention of lumbar back pain or lower extremity pain: Secondary | ICD-10-CM

## 2018-02-10 DIAGNOSIS — R200 Anesthesia of skin: Secondary | ICD-10-CM | POA: Insufficient documentation

## 2018-02-10 DIAGNOSIS — Z803 Family history of malignant neoplasm of breast: Secondary | ICD-10-CM | POA: Insufficient documentation

## 2018-02-10 DIAGNOSIS — E119 Type 2 diabetes mellitus without complications: Secondary | ICD-10-CM | POA: Diagnosis not present

## 2018-02-10 DIAGNOSIS — M797 Fibromyalgia: Secondary | ICD-10-CM

## 2018-02-10 DIAGNOSIS — R531 Weakness: Secondary | ICD-10-CM | POA: Insufficient documentation

## 2018-02-10 DIAGNOSIS — M542 Cervicalgia: Secondary | ICD-10-CM | POA: Diagnosis not present

## 2018-02-10 DIAGNOSIS — M1612 Unilateral primary osteoarthritis, left hip: Secondary | ICD-10-CM | POA: Diagnosis not present

## 2018-02-10 DIAGNOSIS — M25511 Pain in right shoulder: Secondary | ICD-10-CM | POA: Insufficient documentation

## 2018-02-10 DIAGNOSIS — M069 Rheumatoid arthritis, unspecified: Secondary | ICD-10-CM | POA: Diagnosis not present

## 2018-02-10 DIAGNOSIS — I890 Lymphedema, not elsewhere classified: Secondary | ICD-10-CM | POA: Insufficient documentation

## 2018-02-10 DIAGNOSIS — Z91040 Latex allergy status: Secondary | ICD-10-CM | POA: Insufficient documentation

## 2018-02-10 DIAGNOSIS — N8502 Endometrial intraepithelial neoplasia [EIN]: Secondary | ICD-10-CM | POA: Insufficient documentation

## 2018-02-10 DIAGNOSIS — M461 Sacroiliitis, not elsewhere classified: Secondary | ICD-10-CM

## 2018-02-10 DIAGNOSIS — Z87891 Personal history of nicotine dependence: Secondary | ICD-10-CM | POA: Insufficient documentation

## 2018-02-10 DIAGNOSIS — M79602 Pain in left arm: Secondary | ICD-10-CM | POA: Diagnosis not present

## 2018-02-10 DIAGNOSIS — Z8249 Family history of ischemic heart disease and other diseases of the circulatory system: Secondary | ICD-10-CM | POA: Insufficient documentation

## 2018-02-10 DIAGNOSIS — M5136 Other intervertebral disc degeneration, lumbar region: Secondary | ICD-10-CM | POA: Insufficient documentation

## 2018-02-10 DIAGNOSIS — M47816 Spondylosis without myelopathy or radiculopathy, lumbar region: Secondary | ICD-10-CM | POA: Diagnosis not present

## 2018-02-10 DIAGNOSIS — Z96652 Presence of left artificial knee joint: Secondary | ICD-10-CM | POA: Diagnosis not present

## 2018-02-10 DIAGNOSIS — E7800 Pure hypercholesterolemia, unspecified: Secondary | ICD-10-CM | POA: Diagnosis not present

## 2018-02-10 DIAGNOSIS — I5022 Chronic systolic (congestive) heart failure: Secondary | ICD-10-CM | POA: Insufficient documentation

## 2018-02-10 DIAGNOSIS — I214 Non-ST elevation (NSTEMI) myocardial infarction: Secondary | ICD-10-CM | POA: Diagnosis not present

## 2018-02-10 DIAGNOSIS — Z825 Family history of asthma and other chronic lower respiratory diseases: Secondary | ICD-10-CM | POA: Insufficient documentation

## 2018-02-10 DIAGNOSIS — L03115 Cellulitis of right lower limb: Secondary | ICD-10-CM | POA: Insufficient documentation

## 2018-02-10 DIAGNOSIS — L03116 Cellulitis of left lower limb: Secondary | ICD-10-CM | POA: Insufficient documentation

## 2018-02-10 DIAGNOSIS — M47818 Spondylosis without myelopathy or radiculopathy, sacral and sacrococcygeal region: Secondary | ICD-10-CM

## 2018-02-10 DIAGNOSIS — Z882 Allergy status to sulfonamides status: Secondary | ICD-10-CM | POA: Insufficient documentation

## 2018-02-10 DIAGNOSIS — Z800 Family history of malignant neoplasm of digestive organs: Secondary | ICD-10-CM | POA: Insufficient documentation

## 2018-02-10 DIAGNOSIS — Z808 Family history of malignant neoplasm of other organs or systems: Secondary | ICD-10-CM | POA: Insufficient documentation

## 2018-02-10 DIAGNOSIS — Z881 Allergy status to other antibiotic agents status: Secondary | ICD-10-CM | POA: Insufficient documentation

## 2018-02-10 DIAGNOSIS — Z9889 Other specified postprocedural states: Secondary | ICD-10-CM | POA: Insufficient documentation

## 2018-02-10 MED ORDER — OXYCODONE-ACETAMINOPHEN 10-325 MG PO TABS: 1.0000 | ORAL_TABLET | Freq: Three times a day (TID) | ORAL | 0 refills | Status: DC | PRN

## 2018-02-10 NOTE — Progress Notes (Signed)
Patient's Name: [name] [name]  MRN: [id number]  Referring Provider: Virginia Crews, MD  DOB: 14-Jul-1951  PCP: Virginia Crews, MD  DOS: 02/10/2018  Note by: Gillis Santa, MD  Service setting: Ambulatory outpatient  Specialty: Interventional Pain Management  Location: [facility] (AMB) Pain Management Facility    Patient type: Established   Primary Reason(s) for Visit: Encounter for prescription drug management. (Level of risk: moderate)  CC: Back Pain (lower bilateral); Knee Pain (bilateral); Neck Pain; and Arm Pain (left)  HPI  [name] [name] is a 67 y.o. year old, female patient, who comes today for a medication management evaluation. She has Allergic rhinitis; Chronic venous insufficiency; Atherosclerosis of coronary artery; Clinical depression; Diabetes mellitus, type 2 ([place]); Venous ulcer of ankle ([place]); Gastroduodenal ulcer; Rheumatoid arthritis ([place]); Acute non-ST elevation myocardial infarction (NSTEMI) ([place]); Chronic systolic heart failure ([place]); Pure hypercholesterolemia; HTN (hypertension); Lymphedema; Right shoulder pain; Health care maintenance; Atypical endometrial hyperplasia; Chronic pain disorder; and Complex endometrial hyperplasia with atypia on their problem list. Her primarily concern today is the Back Pain (lower bilateral); Knee Pain (bilateral); Neck Pain; and Arm Pain (left)  Pain Assessment: Location: Other (Comment)(neck and shoulders, lower back bilateral, bilateral knees) (see visit information for pain sites) Radiating: shoulder pain is going into left arm and hand causing numbness and tingling and weakness in her grip.  Onset: More than a month ago Duration: Chronic pain Quality: Pins and needles, Throbbing, Tingling, Discomfort, Constant Severity: 8 /10 (self-reported pain score)  Note: Reported level is inconsistent with clinical observations. Clinically the patient looks like a 2/10 A 2/10 is viewed as "Mild to Moderate" and described as noticeable and distracting.  Impossible to hide from other people. More frequent flare-ups. Still possible to adapt and function close to normal. It can be very annoying and may have occasional stronger flare-ups. With discipline, patients may get used to it and adapt.       When using our objective Pain Scale, levels between 6 and 10/10 are said to belong in an emergency room, as it progressively worsens from a 6/10, described as severely limiting, requiring emergency care not usually available at an outpatient pain management facility. At a 6/10 level, communication becomes difficult and requires great effort. Assistance to reach the emergency department may be required. Facial flushing and profuse sweating along with potentially dangerous increases in heart rate and blood pressure will be evident. Effect on ADL: difficult to perform household duties as well as ADL's  Timing: Constant Modifying factors: medications knock the edge off but pain never goes away.  rest takes the pressure off joints and they don't hurt as bad   [name] [name] was last scheduled for an appointment on 01/22/2018 for medication management. During today's appointment we reviewed [name] [name] chronic pain status, as well as her outpatient medication regimen.  The patient  reports that she does not use drugs. Her body mass index is 47.65 kg/m.  Further details on both, my assessment(s), as well as the proposed treatment plan, please see below.  Controlled Substance Pharmacotherapy Assessment REMS (Risk Evaluation and Mitigation Strategy)  Analgesic: 01/16/2018 1 12/17/2017 Oxycodone-Acetaminophen 10-325 90 30 Bi Lat [id number] Nor (4618) 0 45.00 MME Comm Ins [place]  MME/day: 45 mg/day.  Janett Billow, RN  02/10/2018  9:53 AM  Sign at close encounter Nursing Pain Medication Assessment:  Safety precautions to be maintained throughout the outpatient stay will include: orient to surroundings, keep bed in low position, maintain call bell within reach at all  times, provide assistance with transfer out of bed and ambulation.  Medication Inspection Compliance: [name] [name] did not comply with our request to bring her pills to be counted. She was reminded that bringing the medication bottles, even when empty, is a requirement.  Medication: None brought in. Pill/Patch Count: None available to be counted. Bottle Appearance: No container available. Did not bring bottle(s) to appointment. Filled Date: N/A Last Medication intake:  Today   Patient counseled and will bring pills to next visit for count.    Pharmacokinetics: Liberation and absorption (onset of action): WNL Distribution (time to peak effect): WNL Metabolism and excretion (duration of action): WNL         Pharmacodynamics: Desired effects: Analgesia: [name] [name] reports >50% benefit. Functional ability: Patient reports that medication allows her to accomplish basic ADLs Clinically meaningful improvement in function (CMIF): Sustained CMIF goals met Perceived effectiveness: Described as relatively effective, allowing for increase in activities of daily living (ADL) Undesirable effects: Side-effects or Adverse reactions: None reported Monitoring: [place] PMP: Online review of the past 89-monthperiod conducted. Compliant with practice rules and regulations Last UDS on record: Summary  Date Value Ref Range Status  08/13/2017 FINAL  Final    Comment:    ==================================================================== TOXASSURE COMP DRUG ANALYSIS,UR ==================================================================== Test                             Result       Flag       Units Drug Present and Declared for Prescription Verification   Oxycodone                      2498         EXPECTED   ng/mg creat   Oxymorphone                    160          EXPECTED   ng/mg creat   Noroxycodone                   3393         EXPECTED   ng/mg creat    Sources of oxycodone include scheduled  prescription medications.    Oxymorphone and noroxycodone are expected metabolites of    oxycodone. Oxymorphone is also available as a scheduled    prescription medication.   Gabapentin                     PRESENT      EXPECTED   Citalopram                     PRESENT      EXPECTED   Desmethylcitalopram            PRESENT      EXPECTED    Desmethylcitalopram is an expected metabolite of citalopram or    the enantiomeric form, escitalopram.   Acetaminophen                  PRESENT      EXPECTED Drug Absent but Declared for Prescription Verification   Tizanidine                     Not Detected UNEXPECTED    Tizanidine, as indicated in the declared medication list, is not    always detected even when used as directed.  Salicylate                     Not Detected UNEXPECTED    Aspirin, as indicated in the declared medication list, is not    always detected even when used as directed. ==================================================================== Test                      Result    Flag   Units      Ref Range   Creatinine              94               mg/dL      >=20 ==================================================================== Declared Medications:  The flagging and interpretation on this report are based on the  following declared medications.  Unexpected results may arise from  inaccuracies in the declared medications.  **Note: The testing scope of this panel includes these medications:  Citalopram  Gabapentin  Oxycodone (Oxycodone Acetaminophen)  **Note: The testing scope of this panel does not include small to  moderate amounts of these reported medications:  Acetaminophen (Oxycodone Acetaminophen)  Aspirin (Aspirin 81)  Tizanidine  **Note: The testing scope of this panel does not include following  reported medications:  Carvedilol  Cetirizine  Furosemide  Glipizide  Losartan (Losartan Potassium)  Meloxicam   Prednisone ==================================================================== For clinical consultation, please call [phone]. ====================================================================    UDS interpretation: Compliant          Medication Assessment Form: Reviewed. Patient indicates being compliant with therapy Treatment compliance: Compliant Risk Assessment Profile: Aberrant behavior: See prior evaluations. None observed or detected today Comorbid factors increasing risk of overdose: See prior notes. No additional risks detected today Risk of substance use disorder (SUD): Low Opioid Risk Tool - 12/17/17 1106      Family History of Substance Abuse   Alcohol  Negative    Illegal Drugs  Negative    Rx Drugs  Negative      Personal History of Substance Abuse   Alcohol  Negative    Illegal Drugs  Negative      Age   Age between 61-45 years   No      Psychological Disease   Psychological Disease  Negative    Depression  Negative      Total Score   Opioid Risk Tool Scoring  0    Opioid Risk Interpretation  Low Risk      ORT Scoring interpretation table:  Score <3 = Low Risk for SUD  Score between 4-7 = Moderate Risk for SUD  Score >8 = High Risk for Opioid Abuse   Risk Mitigation Strategies:  Patient Counseling: Covered Patient-Prescriber Agreement (PPA): Present and active  Notification to other healthcare providers: Done  Pharmacologic Plan: No change in therapy, at this time.             Laboratory Chemistry  Inflammation Markers (CRP: Acute Phase) (ESR: Chronic Phase) Lab Results  Component Value Date   LATICACIDVEN 1.4 09/05/2015                         Rheumatology Markers No results found for: RF, ANA, Therisa Doyne, [facility]              Renal Function Markers Lab Results  Component Value Date   BUN 18 09/06/2017   CREATININE 0.68 09/06/2017   GFRAA >60 09/06/2017   GFRNONAA >60 09/06/2017  Hepatic Function Markers Lab Results  Component Value Date   AST 18 07/18/2017   ALT 13 07/18/2017   ALBUMIN 4.0 07/18/2017   ALKPHOS 75 07/18/2017   LIPASE 20 (L) 09/04/2015                 Electrolytes Lab Results  Component Value Date   NA 138 09/06/2017   K 3.5 09/06/2017   CL 98 (L) 09/06/2017   CALCIUM 7.7 (L) 09/06/2017                        Neuropathy Markers Lab Results  Component Value Date   HGBA1C 8.4 (H) 07/18/2017   HIV Non Reactive 07/18/2017                 Bone Pathology Markers No results found for: VD25OH, VD125OH2TOT, ZO1096EA5, WU9811BJ4, 25OHVITD1, 25OHVITD2, 25OHVITD3, TESTOFREE, TESTOSTERONE                       Coagulation Parameters Lab Results  Component Value Date   INR 0.97 08/26/2017   LABPROT 12.8 08/26/2017   APTT 24 08/26/2017   PLT 301 09/04/2017                 Cardiovascular Markers Lab Results  Component Value Date   CKTOTAL 62 11/04/2013   CKMB 1.2 11/04/2013   TROPONINI 1.00 (H) 09/05/2015   HGB 9.6 (L) 09/04/2017   HCT 29.8 (L) 09/04/2017                 CA Markers No results found for: CEA, CA125, LABCA2               Note: Lab results reviewed.  Recent Diagnostic Imaging Results  DG Knee 1-2 Views Right CLINICAL DATA:  Knee pain  EXAM: RIGHT KNEE - 1-2 VIEW  COMPARISON:  09/02/2017  FINDINGS: No fracture or malalignment. Marked arthritis of the medial compartment of the knee with narrowing, spurring and sclerosis. Mild degenerative changes of the lateral and patellofemoral compartments. No large knee effusion.  IMPRESSION: Marked arthritis involving medial compartment of the right knee. No acute osseous abnormality.  Electronically Signed   By: Donavan Foil M.D.   On: 11/19/2017 15:45 DG Knee 1-2 Views Left CLINICAL DATA:  Bilateral knee pain for many years  EXAM: LEFT KNEE - 1-2 VIEW  COMPARISON:  CT 12/31/2013, radiograph 09/02/2013  FINDINGS: Interval left medial hemiarthroplasty  with intact appearing hardware. Slight lateral tilt of the femoral prosthetic. Mild degenerative change of the lateral compartment and patellofemoral compartment. Trace knee effusion. Probable hardware defects in the proximal to midshaft of the tibia. Prominent spurring of the tibial spines.  IMPRESSION: 1. No acute osseous abnormality. 2. Interval medial hemiarthroplasty of the left knee 3. Mild degenerative changes of the lateral and patellofemoral compartments with small knee effusion.  Electronically Signed   By: Donavan Foil M.D.   On: 11/19/2017 15:44 DG HIP UNILAT W OR W/O PELVIS 2-3 VIEWS RIGHT CLINICAL DATA:  Hip pain for many years  EXAM: DG HIP (WITH OR WITHOUT PELVIS) 2-3V RIGHT  COMPARISON:  None.  FINDINGS: No fracture or malalignment. Pubic symphysis and rami are intact. Moderate arthritis of the right hip with joint space narrowing, minimal sclerosis and osteophyte.  IMPRESSION: Moderate arthritis of the right hip.  No acute osseous abnormality.  Electronically Signed   By: Donavan Foil M.D.   On: 11/19/2017 15:40 DG HIP Kaylyn Layer W  OR W/O PELVIS 2-3 VIEWS LEFT CLINICAL DATA:  Bilateral hip pain for many years  EXAM: DG HIP (WITH OR WITHOUT PELVIS) 2-3V LEFT  COMPARISON:  CT 09/05/2015  FINDINGS: Mild SI joint degenerative change. Pubic symphysis and rami are intact. Moderate arthritis of the left hip with joint space narrowing. No fracture or malalignment.  IMPRESSION: Moderate arthritis of the left hip. Mild SI joint degenerative changes.  Electronically Signed   By: Donavan Foil M.D.   On: 11/19/2017 15:39 DG Lumbar Spine Complete W/Bend CLINICAL DATA:  Chronic low back pain bilateral hip and knee pain for many years history of rheumatoid arthritis and fibromyalgia  EXAM: LUMBAR SPINE - COMPLETE WITH BENDING VIEWS  COMPARISON:  CT 09/05/2015  FINDINGS: Five non rib bearing lumbar type vertebra. Vertebral body heights are normal.  Mild anterior osteophytes of the lower thoracic spine. Mild degenerative changes at L2-L3. No significant change in alignment with flexion or extension.  IMPRESSION: Mild degenerative changes.  No acute osseous abnormality.  Electronically Signed   By: Donavan Foil M.D.   On: 11/19/2017 15:38  Complexity Note: Imaging results reviewed. Results shared with [name] [name], using Layman's terms.                         Meds   Current Outpatient Medications:  .  cetirizine (ZYRTEC) 10 MG tablet, TAKE 1 TABLET (10 MG TOTAL) BY MOUTH DAILY. (NOT COVER*), Disp: 30 tablet, Rfl: 2 .  citalopram (CELEXA) 40 MG tablet, Take 40 mg by mouth daily., Disp: , Rfl: 3 .  diclofenac (VOLTAREN) 75 MG EC tablet, Take 1 tablet (75 mg total) by mouth 2 (two) times daily., Disp: 28 tablet, Rfl: 0 .  furosemide (LASIX) 20 MG tablet, TAKE 1 TABLET BY MOUTH EVERY DAY, Disp: 90 tablet, Rfl: 1 .  gabapentin (NEURONTIN) 300 MG capsule, Take 2 capsules (600 mg total) by mouth 3 (three) times daily., Disp: 180 capsule, Rfl: 5 .  glipiZIDE (GLUCOTROL) 5 MG tablet, TAKE 1 TABLET BY MOUTH TWICE A DAY BEFORE MEALS, Disp: 60 tablet, Rfl: 1 .  glucose blood (ACCU-CHEK AVIVA PLUS) test strip, To check blood sugar once a day.  DX: E11.9, Disp: 100 each, Rfl: 1 .  methotrexate (RHEUMATREX) 2.5 MG tablet, Take 15 mg by mouth once a week. , Disp: , Rfl:  .  oxyCODONE-acetaminophen (PERCOCET) 10-325 MG tablet, Take 1 tablet by mouth every 8 (eight) hours as needed for pain. For chronic pain To fill on or after: 02/15/18, 03/17/18 To last for 30 days from fill date, Disp: 90 tablet, Rfl: 0  ROS  Constitutional: Denies any fever or chills Gastrointestinal: No reported hemesis, hematochezia, vomiting, or acute GI distress Musculoskeletal: Denies any acute onset joint swelling, redness, loss of ROM, or weakness Neurological: No reported episodes of acute onset apraxia, aphasia, dysarthria, agnosia, amnesia, paralysis, loss of  coordination, or loss of consciousness  Allergies  [name] [name] is allergic to latex; doxycycline; and sulfa antibiotics.  [place]  Drug: [name] [name]  reports that she does not use drugs. Alcohol:  reports that she does not drink alcohol. Tobacco:  reports that she quit smoking about 21 years ago. She has a 29.00 pack-year smoking history. she has never used smokeless tobacco. Medical:  has a past medical history of Arthritis, BRCA negative (07/2017), Cancer ([place]), Cellulitis of both lower extremities, CHF (congestive heart failure) ([place]), Coronary artery disease, Diabetes mellitus without complication ([place]), Family history of breast cancer (07/2017),  Family history of ovarian cancer, Fibromyalgia, Fibromyalgia affecting shoulder region, Hyperlipidemia, Hypertension, and Spinal stenosis. Surgical: [name] [name]  has a past surgical history that includes Medial partial knee replacement (Left, 01/06/2014); Vascular Stent (11/04/2013); Anal fissurectomy (01/2004); Hernia repair; Umbilical hernia repair (N/A, 09/05/2015); Ablation saphenous vein w/ RFA; Coronary angioplasty; Dilation and curettage of uterus; Laparoscopic hysterectomy (N/A, 09/03/2017); and Cystoscopy (09/03/2017). Family: family history includes Aneurysm in her father; Breast cancer (age of onset: 67) in her sister; Breast cancer (age of onset: 30) in her sister; COPD in her father and sister; Cataracts in her father and mother; Colon cancer in her paternal uncle and paternal uncle; Dementia in her father and mother; Diabetes in her father; Fibromyalgia in her sister; Heart attack in her brother; Hypertension in her mother and sister; Melanoma in her father; Migraines in her brother and sister; Ovarian cancer (age of onset: 79) in her sister; Thyroid disease in her mother; Uterine cancer in her cousin and cousin.  Constitutional Exam  General appearance: moderately obese and Well nourished, well developed, and well hydrated. In no apparent acute  distress Vitals:   02/10/18 0925  BP: (!) 160/83  Pulse: 87  Resp: 16  Temp: 98 F (36.7 C)  TempSrc: Oral  SpO2: 94%  Weight: (!) 318 lb (144.2 kg)  Height: 5' 8.5" (1.74 m)   BMI Assessment: Estimated body mass index is 47.65 kg/m as calculated from the following:   Height as of this encounter: 5' 8.5" (1.74 m).   Weight as of this encounter: 318 lb (144.2 kg).  BMI interpretation table: BMI level Category Range association with higher incidence of chronic pain  <18 kg/m2 Underweight   18.5-24.9 kg/m2 Ideal body weight   25-29.9 kg/m2 Overweight Increased incidence by 20%  30-34.9 kg/m2 Obese (Class I) Increased incidence by 68%  35-39.9 kg/m2 Severe obesity (Class II) Increased incidence by 136%  >40 kg/m2 Extreme obesity (Class [name]) Increased incidence by 254%   BMI Readings from Last 4 Encounters:  02/10/18 47.65 kg/m  01/22/18 48.35 kg/m  01/06/18 48.35 kg/m  12/17/17 48.66 kg/m   Wt Readings from Last 4 Encounters:  02/10/18 (!) 318 lb (144.2 kg)  01/22/18 (!) 318 lb (144.2 kg)  01/06/18 (!) 318 lb (144.2 kg)  12/17/17 (!) 320 lb (145.2 kg)  Psych/Mental status: Alert, oriented x 3 (person, place, & time)       Eyes: PERLA Respiratory: No evidence of acute respiratory distress  Cervical Spine Area Exam  Skin & Axial Inspection: No masses, redness, edema, swelling, or associated skin lesions Alignment: Symmetrical Functional ROM: Unrestricted ROM      Stability: No instability detected Muscle Tone/Strength: Functionally intact. No obvious neuro-muscular anomalies detected. Sensory (Neurological): Unimpaired Palpation: No palpable anomalies              Upper Extremity (UE) Exam    Side: Right upper extremity  Side: Left upper extremity  Skin & Extremity Inspection: Skin color, temperature, and hair growth are WNL. No peripheral edema or cyanosis. No masses, redness, swelling, asymmetry, or associated skin lesions. No contractures.  Skin & Extremity  Inspection: Skin color, temperature, and hair growth are WNL. No peripheral edema or cyanosis. No masses, redness, swelling, asymmetry, or associated skin lesions. No contractures.  Functional ROM: Decreased ROM for shoulder  Functional ROM: Decreased ROM for shoulder  Muscle Tone/Strength: Functionally intact. No obvious neuro-muscular anomalies detected.  Muscle Tone/Strength: Functionally intact. No obvious neuro-muscular anomalies detected.  Sensory (Neurological): Unimpaired  Sensory (Neurological): Unimpaired          Palpation: Tender              Palpation: No palpable anomalies              Specialized Test(s): Deferred         Specialized Test(s): Deferred          Thoracic Spine Area Exam  Skin & Axial Inspection: No masses, redness, or swelling Alignment: Symmetrical Functional ROM: Unrestricted ROM Stability: No instability detected Muscle Tone/Strength: Functionally intact. No obvious neuro-muscular anomalies detected. Sensory (Neurological): Unimpaired Muscle strength & Tone: No palpable anomalies  Lumbar Spine Area Exam  Skin & Axial Inspection: No masses, redness, or swelling Alignment: Symmetrical Functional ROM: Unrestricted ROM      Stability: No instability detected Muscle Tone/Strength: Functionally intact. No obvious neuro-muscular anomalies detected. Sensory (Neurological): Unimpaired Palpation: No palpable anomalies       Provocative Tests: Lumbar Hyperextension and rotation test: Positive bilaterally for facet joint pain. Lumbar Lateral bending test: Positive due to pain. Patrick's Maneuver: Positive for bilateral S-I arthralgia              Gait & Posture Assessment  Ambulation: Limited Gait: Modified gait pattern (slower gait speed, wider stride width, and longer stance duration) associated with morbid obesity Posture: Difficulty standing up straight, due to pain   Lower Extremity Exam    Side: Right lower extremity  Side: Left lower extremity   Skin & Extremity Inspection: Skin color, temperature, and hair growth are WNL. No peripheral edema or cyanosis. No masses, redness, swelling, asymmetry, or associated skin lesions. No contractures.  Skin & Extremity Inspection: Skin color, temperature, and hair growth are WNL. No peripheral edema or cyanosis. No masses, redness, swelling, asymmetry, or associated skin lesions. No contractures.  Functional ROM: Unrestricted ROM          Functional ROM: Unrestricted ROM          Muscle Tone/Strength: Functionally intact. No obvious neuro-muscular anomalies detected.  Muscle Tone/Strength: Functionally intact. No obvious neuro-muscular anomalies detected.  Sensory (Neurological): Unimpaired  Sensory (Neurological): Unimpaired  Palpation: No palpable anomalies  Palpation: No palpable anomalies   Assessment  Primary Diagnosis & Pertinent Problem List: The primary encounter diagnosis was Primary osteoarthritis of left shoulder. Diagnoses of Cervicalgia, Primary osteoarthritis of both knees, Fibromyalgia, Spinal stenosis, lumbar region, with neurogenic claudication, Lumbar facet arthropathy, Chronic pain syndrome, S/P total knee replacement, left, SI joint arthritis (HCC), Chronic venous insufficiency, and Lumbar degenerative disc disease were also pertinent to this visit.  Status Diagnosis  Responding Persistent Persistent 1. Primary osteoarthritis of left shoulder   2. Cervicalgia   3. Primary osteoarthritis of both knees   4. Fibromyalgia   5. Spinal stenosis, lumbar region, with neurogenic claudication   6. Lumbar facet arthropathy   7. Chronic pain syndrome   8. S/P total knee replacement, left   9. 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 Chronic venous insufficiency   11. Lumbar degenerative disc disease      General Recommendations: The pain condition that the patient suffers from is best treated with a multidisciplinary approach that involves an increase in physical activity to prevent  de-conditioning and worsening of the pain cycle, as well as psychological counseling (formal and/or informal) to address the co-morbid psychological affects of pain. Treatment will often involve judicious use of pain medications and interventional procedures to decrease the pain, allowing the patient to participate in the physical activity  that will ultimately produce long-lasting pain reductions. The goal of the multidisciplinary approach is to return the patient to a higher level of overall function and to restore their ability to perform activities of daily living.  67 year old female with a history of morbid obesity, depression, type 2 diabetes, GI ulcer, hypertension who presents with diffuse body pain including arthralgias of multiple joints secondary to fibromyalgia and low back pain secondary to lumbar degenerative disc disease, lumbar spinal stenosis,and bilateral knee pain secondary to knee osteoarthritis and morbid obesity. Patient has been on chronic opioid therapy for more than 10 years.  Of note patient's husband has metastatic cancer and this is been a huge concern for the patient as she is his primary caregiver.  Patient states that her left shoulder is doing better after the shoulder joint injection.  She has not had a chance to get x-rays of her left shoulder or a CT of her cervical spine given that she has been very busy taking care of her husband.  She is plans on getting those this week.  Also refill the patient's medications as below for 2 months.  Plan: -Encourage patient to obtain cervical spine CT without contrast as well as left shoulder x-ray that were ordered at the last visit -Refill of Percocet as below for 2 months   Plan of Care  Pharmacotherapy (Medications Ordered): Meds ordered this encounter  Medications  . DISCONTD: oxyCODONE-acetaminophen (PERCOCET) 10-325 MG tablet    Sig: Take 1 tablet by mouth every 8 (eight) hours as needed for pain. For chronic pain To  fill on or after: 02/15/18, 03/17/18 To last for 30 days from fill date    Dispense:  90 tablet    Refill:  0    Do not place this medication, or any other prescription from our practice, on "Automatic Refill". Patient may have prescription filled one day early if pharmacy is closed on scheduled refill date.  [name] oxyCODONE-acetaminophen (PERCOCET) 10-325 MG tablet    Sig: Take 1 tablet by mouth every 8 (eight) hours as needed for pain. For chronic pain To fill on or after: 02/15/18, 03/17/18 To last for 30 days from fill date    Dispense:  90 tablet    Refill:  0    Do not place this medication, or any other prescription from our practice, on "Automatic Refill". Patient may have prescription filled one day early if pharmacy is closed on scheduled refill date.    Provider-requested follow-up: Return in about 6 weeks (around 03/24/2018) for Medication Management, After Imaging. Time Note: Greater than 50% of the 25 minute(s) of face-to-face time spent with [name] [name], was spent in counseling/coordination of care regarding: [name] [name] primary cause of pain, the treatment plan, treatment alternatives, medication side effects, the appropriate use of her medications, realistic expectations and the medication agreement. Future Appointments  Date Time Provider [place]  03/24/2018  1:45 PM Gillis Santa, MD [facility] None    Primary Care Physician: Virginia Crews, MD Location: [facility] Note by: Gillis Santa, M.D Date: 02/10/2018; Time: 11:07 AM  There are no Patient Instructions on file for this visit.

## 2018-02-10 NOTE — Progress Notes (Signed)
Nursing Pain Medication Assessment:  Safety precautions to be maintained throughout the outpatient stay will include: orient to surroundings, keep bed in low position, maintain call bell within reach at all times, provide assistance with transfer out of bed and ambulation.  Medication Inspection Compliance: Ms. Colin did not comply with our request to bring her pills to be counted. She was reminded that bringing the medication bottles, even when empty, is a requirement.  Medication: None brought in. Pill/Patch Count: None available to be counted. Bottle Appearance: No container available. Did not bring bottle(s) to appointment. Filled Date: N/A Last Medication intake:  Today   Patient counseled and will bring pills to next visit for count.

## 2018-03-11 ENCOUNTER — Other Ambulatory Visit: Payer: Self-pay | Admitting: Family Medicine

## 2018-03-11 DIAGNOSIS — E11628 Type 2 diabetes mellitus with other skin complications: Secondary | ICD-10-CM

## 2018-03-11 NOTE — Telephone Encounter (Signed)
Last DM FU 08/15/2017, and was told to FU in 3 months. Has 3 cancelled appointments and no FU scheduled.

## 2018-03-11 NOTE — Telephone Encounter (Signed)
Patient called back and was advised she needed an appt. before she ran out of Glipizide.

## 2018-03-11 NOTE — Telephone Encounter (Signed)
Left message to schedule appointment

## 2018-03-11 NOTE — Telephone Encounter (Signed)
1 month refill. Let's get her scheduled for f/u.  Virginia Crews, MD, MPH [facility] 03/11/2018 9:53 AM

## 2018-03-19 ENCOUNTER — Ambulatory Visit: Payer: BLUE CROSS/BLUE SHIELD | Admitting: Family Medicine

## 2018-03-19 ENCOUNTER — Encounter: Payer: Self-pay | Admitting: Family Medicine

## 2018-03-19 VITALS — BP 144/90 | HR 91 | Temp 98.1°F | Resp 22 | Wt 332.0 lb

## 2018-03-19 DIAGNOSIS — R32 Unspecified urinary incontinence: Secondary | ICD-10-CM | POA: Diagnosis not present

## 2018-03-19 DIAGNOSIS — G629 Polyneuropathy, unspecified: Secondary | ICD-10-CM | POA: Insufficient documentation

## 2018-03-19 DIAGNOSIS — G63 Polyneuropathy in diseases classified elsewhere: Secondary | ICD-10-CM

## 2018-03-19 DIAGNOSIS — J301 Allergic rhinitis due to pollen: Secondary | ICD-10-CM | POA: Diagnosis not present

## 2018-03-19 DIAGNOSIS — I872 Venous insufficiency (chronic) (peripheral): Secondary | ICD-10-CM

## 2018-03-19 DIAGNOSIS — E1142 Type 2 diabetes mellitus with diabetic polyneuropathy: Secondary | ICD-10-CM | POA: Diagnosis not present

## 2018-03-19 LAB — POCT GLYCOSYLATED HEMOGLOBIN (HGB A1C)
Est. average glucose Bld gHb Est-mCnc: 235
Hemoglobin A1C: 9.8

## 2018-03-19 MED ORDER — CETIRIZINE HCL 10 MG PO TABS
ORAL_TABLET | ORAL | 3 refills | Status: DC
Start: 2018-03-19 — End: 2019-03-30

## 2018-03-19 MED ORDER — DULAGLUTIDE 0.75 MG/0.5ML ~~LOC~~ SOAJ: 0.7500 mg | SUBCUTANEOUS | 3 refills | Status: DC

## 2018-03-19 MED ORDER — GABAPENTIN 300 MG PO CAPS: 900.0000 mg | ORAL_CAPSULE | Freq: Three times a day (TID) | ORAL | 11 refills | Status: DC

## 2018-03-19 NOTE — Progress Notes (Signed)
Patient: [name] [name] Female    DOB: Apr 28, 1951   67 y.o.   MRN: [id number] Visit Date: 03/19/2018  Today's Provider: Lavon Paganini, MD   I, Martha Clan, CMA, am acting as scribe for Lavon Paganini, MD.  Chief Complaint  Patient presents with  . Fatigue  . Enuresis  . Allergic Rhinitis    Subjective:    HPI   Pt is c/o urinary incontinence, which is worsening since having hysterectomy. She states she voids unexpectedly after standing too long, sitting too long, etc. Not stress incontinence. This is prohibiting her from running errands, etc.  She leaks with walking or any movement.  This was an issue before hysterectomy, but has gotten worse since then.    Pt is also c/o neuropathy in feet. This is worsening, especially on L side.  States that L lower leg "feels like cardboard."  Didn't even feel needle for vascular procedure multiple years ago. She has diabetes, and has not followed up on this since August. States that gabapentin helps with stinging and burning pain.  She is wearing compression stockings to help with chronic venous insufficiency.  She is undergoing multiple CTs and MRIs with pain management.     States her husband was diagnosed with cancer in November, and her has 6 months to live. She has been bust taking care of him, doing household chores that he normally does. Hospice is now visiting him in the home.  Her allergies are worsening. She has run out of Zyrtec, and needs a refill of this.  T2DM - Checking BG at home: no - Medications: glipizide 5mg  BID - Compliance: good - Diet: not great - Exercise: none - eye exam: needs - foot exam: UTD - denies symptoms of hypoglycemia, polyuria, polydipsia, numbness extremities, foot ulcers/trauma   Allergies  Allergen Reactions  . Latex Itching    itching  . Doxycycline Rash  . Sulfa Antibiotics Itching, Rash and Swelling     Current Outpatient Medications:  .  citalopram (CELEXA) 40 MG tablet,  Take 40 mg by mouth daily., Disp: , Rfl: 3 .  gabapentin (NEURONTIN) 300 MG capsule, Take 2 capsules (600 mg total) by mouth 3 (three) times daily., Disp: 180 capsule, Rfl: 5 .  glipiZIDE (GLUCOTROL) 5 MG tablet, TAKE 1 TABLET BY MOUTH TWICE A DAY BEFORE MEALS, Disp: 60 tablet, Rfl: 0 .  glucose blood (ACCU-CHEK AVIVA PLUS) test strip, To check blood sugar once a day.  DX: E11.9, Disp: 100 each, Rfl: 1 .  oxyCODONE-acetaminophen (PERCOCET) 10-325 MG tablet, Take 1 tablet by mouth every 8 (eight) hours as needed for pain. For chronic pain To fill on or after: 02/15/18, 03/17/18 To last for 30 days from fill date, Disp: 90 tablet, Rfl: 0 .  cetirizine (ZYRTEC) 10 MG tablet, TAKE 1 TABLET (10 MG TOTAL) BY MOUTH DAILY. (NOT COVER*) (Patient not taking: Reported on 03/19/2018), Disp: 30 tablet, Rfl: 2  Review of Systems  Constitutional: Positive for activity change, appetite change (decreased), fatigue and unexpected weight change (fluctuating ). Negative for chills, diaphoresis and fever.  HENT: Positive for congestion, sinus pressure, sinus pain and sneezing. Negative for ear pain, postnasal drip, rhinorrhea and sore throat.   Eyes: Positive for itching. Negative for pain, discharge and redness.  Respiratory: Negative for cough, chest tightness and shortness of breath.   Cardiovascular: Positive for leg swelling. Negative for chest pain and palpitations.  Genitourinary: Positive for enuresis. Negative for dysuria.  Musculoskeletal: Positive  for arthralgias and myalgias.  Neurological: Positive for weakness. Negative for numbness.  Psychiatric/Behavioral: Positive for dysphoric mood.    Social History   Tobacco Use  . Smoking status: Former Smoker    Packs/day: 1.00    Years: 29.00    Pack years: 29.00    Last attempt to quit: 12/03/1996    Years since quitting: 21.3  . Smokeless tobacco: Never Used  Substance Use Topics  . Alcohol use: No   Objective:   BP (!) 144/90 (BP Location: Left  Wrist, Patient Position: Sitting, Cuff Size: Large)   Pulse 91   Temp 98.1 F (36.7 C) (Oral)   Resp (!) 22   Wt (!) 332 lb (150.6 kg)   LMP  (LMP Unknown) Comment: age 64  SpO2 90%   BMI 49.75 kg/m  Vitals:   03/19/18 1104  BP: (!) 144/90  Pulse: 91  Resp: (!) 22  Temp: 98.1 F (36.7 C)  TempSrc: Oral  SpO2: 90%  Weight: (!) 332 lb (150.6 kg)     Physical Exam  Constitutional: She is oriented to person, place, and time. She appears well-developed and well-nourished. No distress.  HENT:  Head: Normocephalic and atraumatic.  Eyes: Conjunctivae are normal.  Neck: Neck supple. No thyromegaly present.  Cardiovascular: Normal rate, regular rhythm, normal heart sounds and intact distal pulses.  No murmur heard. Pulmonary/Chest: Effort normal and breath sounds normal. No respiratory distress. She has no wheezes. She has no rales.  Musculoskeletal: She exhibits edema (trace). She exhibits no deformity.  Lymphadenopathy:    She has no cervical adenopathy.  Neurological: She is alert and oriented to person, place, and time.  Skin: Skin is warm and dry. Capillary refill takes less than 2 seconds. No rash noted.  Psychiatric: Her speech is normal and behavior is normal. Her affect is blunt. Cognition and memory are normal. She exhibits a depressed mood.  Vitals reviewed.      Assessment & Plan:   Problem List Items Addressed This Visit      Cardiovascular and Mediastinum   Chronic venous insufficiency    Doing well Continue compression stockings Encouraged elevation as possible        Respiratory   Allergic rhinitis    Worsening with the season change Refilled Zyrtec Could consider Flonase or Singulair in the future if needed        Endocrine   Diabetes mellitus, type 2 ([place]) - Primary    Poorly controlled with A1c continuing to increase -7.4, 8.5, 9.8 Continue glipizide at current dose Given patient's age and comorbidities, concerned about increasing her  glipizide further due to fall risk Due to patient's significant urinary incontinence issues, will avoid SGL T2's at this time We will try GLP, Trulicity to help with blood sugars Advised patient to call her ophthalmologist to schedule diabetic eye exam Up-to-date on foot exam Follow-up in 6 weeks and consider dose titration if tolerating well      Relevant Medications   Dulaglutide (TRULICITY) 5.85 ID/7.8EU SOPN   Other Relevant Orders   POCT glycosylated hemoglobin (Hb A1C) (Completed)     Nervous and Auditory   Peripheral neuropathy    Poorly controlled Related to patient's diabetes Chronic and long-standing issue Increase gabapentin to 900 mg 3 times daily      Relevant Medications   gabapentin (NEURONTIN) 300 MG capsule     Other   Urinary incontinence    Significant urinary incontinence with both urge and stress components as well as  likely bladder spasms Referral to urology for further management      Relevant Orders   Ambulatory referral to Urology       Return in about 6 weeks (around 04/30/2018) for diabetes f/u.   The entirety of the information documented in the History of Present Illness, Review of Systems and Physical Exam were personally obtained by me. Portions of this information were initially documented by Raquel Sarna Ratchford, CMA and reviewed by me for thoroughness and accuracy.    Virginia Crews, MD, MPH [facility] 03/19/2018 12:01 PM

## 2018-03-19 NOTE — Assessment & Plan Note (Signed)
Significant urinary incontinence with both urge and stress components as well as likely bladder spasms Referral to urology for further management

## 2018-03-19 NOTE — Assessment & Plan Note (Signed)
Doing well Continue compression stockings Encouraged elevation as possible

## 2018-03-19 NOTE — Patient Instructions (Signed)

## 2018-03-19 NOTE — Assessment & Plan Note (Signed)
Poorly controlled with A1c continuing to increase -7.4, 8.5, 9.8 Continue glipizide at current dose Given patient's age and comorbidities, concerned about increasing her glipizide further due to fall risk Due to patient's significant urinary incontinence issues, will avoid SGL T2's at this time We will try GLP, Trulicity to help with blood sugars Advised patient to call her ophthalmologist to schedule diabetic eye exam Up-to-date on foot exam Follow-up in 6 weeks and consider dose titration if tolerating well

## 2018-03-19 NOTE — Assessment & Plan Note (Signed)
Poorly controlled Related to patient's diabetes Chronic and long-standing issue Increase gabapentin to 900 mg 3 times daily

## 2018-03-19 NOTE — Assessment & Plan Note (Signed)
Worsening with the season change Refilled Zyrtec Could consider Flonase or Singulair in the future if needed

## 2018-03-21 ENCOUNTER — Ambulatory Visit: Payer: BLUE CROSS/BLUE SHIELD

## 2018-03-24 ENCOUNTER — Encounter: Payer: BLUE CROSS/BLUE SHIELD | Admitting: Student in an Organized Health Care Education/Training Program

## 2018-04-03 ENCOUNTER — Ambulatory Visit: Payer: BLUE CROSS/BLUE SHIELD

## 2018-04-10 ENCOUNTER — Ambulatory Visit
Payer: BLUE CROSS/BLUE SHIELD | Attending: Student in an Organized Health Care Education/Training Program | Admitting: Student in an Organized Health Care Education/Training Program

## 2018-04-10 ENCOUNTER — Other Ambulatory Visit: Payer: Self-pay

## 2018-04-10 ENCOUNTER — Encounter: Payer: Self-pay | Admitting: Student in an Organized Health Care Education/Training Program

## 2018-04-10 VITALS — BP 152/99 | HR 90 | Temp 98.3°F | Resp 18 | Ht 68.0 in | Wt 320.0 lb

## 2018-04-10 DIAGNOSIS — Z808 Family history of malignant neoplasm of other organs or systems: Secondary | ICD-10-CM | POA: Insufficient documentation

## 2018-04-10 DIAGNOSIS — Z7984 Long term (current) use of oral hypoglycemic drugs: Secondary | ICD-10-CM | POA: Insufficient documentation

## 2018-04-10 DIAGNOSIS — M797 Fibromyalgia: Secondary | ICD-10-CM | POA: Diagnosis not present

## 2018-04-10 DIAGNOSIS — M47816 Spondylosis without myelopathy or radiculopathy, lumbar region: Secondary | ICD-10-CM

## 2018-04-10 DIAGNOSIS — G894 Chronic pain syndrome: Secondary | ICD-10-CM

## 2018-04-10 DIAGNOSIS — M069 Rheumatoid arthritis, unspecified: Secondary | ICD-10-CM | POA: Diagnosis not present

## 2018-04-10 DIAGNOSIS — M48062 Spinal stenosis, lumbar region with neurogenic claudication: Secondary | ICD-10-CM

## 2018-04-10 DIAGNOSIS — Z825 Family history of asthma and other chronic lower respiratory diseases: Secondary | ICD-10-CM | POA: Insufficient documentation

## 2018-04-10 DIAGNOSIS — M1611 Unilateral primary osteoarthritis, right hip: Secondary | ICD-10-CM | POA: Diagnosis not present

## 2018-04-10 DIAGNOSIS — Z79899 Other long term (current) drug therapy: Secondary | ICD-10-CM | POA: Insufficient documentation

## 2018-04-10 DIAGNOSIS — M1612 Unilateral primary osteoarthritis, left hip: Secondary | ICD-10-CM | POA: Insufficient documentation

## 2018-04-10 DIAGNOSIS — M47818 Spondylosis without myelopathy or radiculopathy, sacral and sacrococcygeal region: Secondary | ICD-10-CM

## 2018-04-10 DIAGNOSIS — M170 Bilateral primary osteoarthritis of knee: Secondary | ICD-10-CM | POA: Diagnosis not present

## 2018-04-10 DIAGNOSIS — M542 Cervicalgia: Secondary | ICD-10-CM | POA: Diagnosis not present

## 2018-04-10 DIAGNOSIS — Z91040 Latex allergy status: Secondary | ICD-10-CM | POA: Insufficient documentation

## 2018-04-10 DIAGNOSIS — M5136 Other intervertebral disc degeneration, lumbar region: Secondary | ICD-10-CM | POA: Diagnosis not present

## 2018-04-10 DIAGNOSIS — Z96652 Presence of left artificial knee joint: Secondary | ICD-10-CM

## 2018-04-10 DIAGNOSIS — Z79891 Long term (current) use of opiate analgesic: Secondary | ICD-10-CM | POA: Diagnosis not present

## 2018-04-10 DIAGNOSIS — Z833 Family history of diabetes mellitus: Secondary | ICD-10-CM | POA: Insufficient documentation

## 2018-04-10 DIAGNOSIS — Z8711 Personal history of peptic ulcer disease: Secondary | ICD-10-CM | POA: Diagnosis not present

## 2018-04-10 DIAGNOSIS — N8502 Endometrial intraepithelial neoplasia [EIN]: Secondary | ICD-10-CM | POA: Diagnosis not present

## 2018-04-10 DIAGNOSIS — I5022 Chronic systolic (congestive) heart failure: Secondary | ICD-10-CM | POA: Insufficient documentation

## 2018-04-10 DIAGNOSIS — M19012 Primary osteoarthritis, left shoulder: Secondary | ICD-10-CM | POA: Diagnosis not present

## 2018-04-10 DIAGNOSIS — I2510 Atherosclerotic heart disease of native coronary artery without angina pectoris: Secondary | ICD-10-CM | POA: Insufficient documentation

## 2018-04-10 DIAGNOSIS — M79602 Pain in left arm: Secondary | ICD-10-CM | POA: Insufficient documentation

## 2018-04-10 DIAGNOSIS — Z87891 Personal history of nicotine dependence: Secondary | ICD-10-CM | POA: Insufficient documentation

## 2018-04-10 DIAGNOSIS — M25511 Pain in right shoulder: Secondary | ICD-10-CM | POA: Insufficient documentation

## 2018-04-10 DIAGNOSIS — Z881 Allergy status to other antibiotic agents status: Secondary | ICD-10-CM | POA: Insufficient documentation

## 2018-04-10 DIAGNOSIS — R32 Unspecified urinary incontinence: Secondary | ICD-10-CM | POA: Diagnosis not present

## 2018-04-10 DIAGNOSIS — I214 Non-ST elevation (NSTEMI) myocardial infarction: Secondary | ICD-10-CM | POA: Insufficient documentation

## 2018-04-10 DIAGNOSIS — I890 Lymphedema, not elsewhere classified: Secondary | ICD-10-CM | POA: Insufficient documentation

## 2018-04-10 DIAGNOSIS — I872 Venous insufficiency (chronic) (peripheral): Secondary | ICD-10-CM | POA: Diagnosis not present

## 2018-04-10 DIAGNOSIS — F329 Major depressive disorder, single episode, unspecified: Secondary | ICD-10-CM | POA: Insufficient documentation

## 2018-04-10 DIAGNOSIS — J309 Allergic rhinitis, unspecified: Secondary | ICD-10-CM | POA: Diagnosis not present

## 2018-04-10 DIAGNOSIS — I110 Hypertensive heart disease with heart failure: Secondary | ICD-10-CM | POA: Diagnosis not present

## 2018-04-10 DIAGNOSIS — Z9889 Other specified postprocedural states: Secondary | ICD-10-CM | POA: Insufficient documentation

## 2018-04-10 DIAGNOSIS — Z8249 Family history of ischemic heart disease and other diseases of the circulatory system: Secondary | ICD-10-CM | POA: Insufficient documentation

## 2018-04-10 DIAGNOSIS — Z800 Family history of malignant neoplasm of digestive organs: Secondary | ICD-10-CM | POA: Insufficient documentation

## 2018-04-10 DIAGNOSIS — E7800 Pure hypercholesterolemia, unspecified: Secondary | ICD-10-CM | POA: Insufficient documentation

## 2018-04-10 DIAGNOSIS — Z90710 Acquired absence of both cervix and uterus: Secondary | ICD-10-CM | POA: Insufficient documentation

## 2018-04-10 DIAGNOSIS — Z803 Family history of malignant neoplasm of breast: Secondary | ICD-10-CM | POA: Insufficient documentation

## 2018-04-10 DIAGNOSIS — Z882 Allergy status to sulfonamides status: Secondary | ICD-10-CM | POA: Insufficient documentation

## 2018-04-10 MED ORDER — OXYCODONE-ACETAMINOPHEN 10-325 MG PO TABS
1.0000 | ORAL_TABLET | Freq: Three times a day (TID) | ORAL | 0 refills | Status: DC | PRN
Start: 2018-04-10 — End: 2018-04-10

## 2018-04-10 MED ORDER — OXYCODONE-ACETAMINOPHEN 10-325 MG PO TABS: 1.0000 | ORAL_TABLET | Freq: Three times a day (TID) | ORAL | 0 refills | Status: DC | PRN

## 2018-04-10 NOTE — Progress Notes (Signed)
Nursing Pain Medication Assessment:  Safety precautions to be maintained throughout the outpatient stay will include: orient to surroundings, keep bed in low position, maintain call bell within reach at all times, provide assistance with transfer out of bed and ambulation.  Medication Inspection Compliance: Pill count conducted under aseptic conditions, in front of the patient. Neither the pills nor the bottle was removed from the patient's sight at any time. Once count was completed pills were immediately returned to the patient in their original bottle.  Medication: Oxycodone/APAP Pill/Patch Count: 21 of 90 pills remain Pill/Patch Appearance: Markings consistent with prescribed medication Bottle Appearance: Standard pharmacy container. Clearly labeled. Filled Date: 4 / 15 / 2019 Last Medication intake:  Today

## 2018-04-10 NOTE — Progress Notes (Signed)
Patient's Name: [name] [name]  MRN: [id number]  Referring Provider: Virginia Crews, MD  DOB: 1950/12/04  PCP: Virginia Crews, MD  DOS: 04/10/2018  Note by: Gillis Santa, MD  Service setting: Ambulatory outpatient  Specialty: Interventional Pain Management  Location: [facility] (AMB) Pain Management Facility    Patient type: Established   Primary Reason(s) for Visit: Encounter for prescription drug management. (Level of risk: moderate)  CC: Arm Pain (left) and Neck Pain  HPI  [name] [name] is a 67 y.o. year old, female patient, who comes today for a medication management evaluation. She has Allergic rhinitis; Chronic venous insufficiency; Atherosclerosis of coronary artery; Clinical depression; Diabetes mellitus, type 2 ([place]); Venous ulcer of ankle ([place]); Gastroduodenal ulcer; Rheumatoid arthritis ([place]); Acute non-ST elevation myocardial infarction (NSTEMI) ([place]); Chronic systolic heart failure ([place]); Pure hypercholesterolemia; HTN (hypertension); Lymphedema; Right shoulder pain; Health care maintenance; Atypical endometrial hyperplasia; Chronic pain disorder; Complex endometrial hyperplasia with atypia; Peripheral neuropathy; and Urinary incontinence on their problem list. Her primarily concern today is the Arm Pain (left) and Neck Pain  Pain Assessment: Location: Left Arm Radiating: origin of pain is upper left arm; radiates to neck and down to include all fingers Onset: More than a month ago Duration: Neuropathic pain, Chronic pain Quality: Constant, Pins and needles, Throbbing, Tingling, Discomfort Severity: 7 /10 (subjective, self-reported pain score)  Note: Reported level is compatible with observation.                         When using our objective Pain Scale, levels between 6 and 10/10 are said to belong in an emergency room, as it progressively worsens from a 6/10, described as severely limiting, requiring emergency care not usually available at an outpatient pain management facility.  At a 6/10 level, communication becomes difficult and requires great effort. Assistance to reach the emergency department may be required. Facial flushing and profuse sweating along with potentially dangerous increases in heart rate and blood pressure will be evident. Effect on ADL: difficult to perform duties around the house Timing: Constant Modifying factors: nothing helps BP: (!) 152/99  HR: 90  [name] [name] was last scheduled for an appointment on 02/10/2018 for medication management. During today's appointment we reviewed [name] [name] chronic pain status, as well as her outpatient medication regimen.  Patient returns today for medication refill.  In regards her gabapentin dosing, she states that she is taking 1200 mg to 1800 mg at night.  I advised her not to take any more than this.  I also advised the patient not to take this medication with her Percocet as this could impact her respiratory drive especially in the context of her obstructive sleep apnea.  Patient is continuing to take care of her husband who is been diagnosed with cancer.  She is also been practicing mindfulness, meditation which she states has been helpful.  Patient is endorsing worsening left shoulder, forearm, hand pain.  The patient  reports that she does not use drugs. Her body mass index is 48.66 kg/m.  Further details on both, my assessment(s), as well as the proposed treatment plan, please see below.  Controlled Substance Pharmacotherapy Assessment REMS (Risk Evaluation and Mitigation Strategy)  Analgesic: Percocet 10 mg 3 times daily as needed, quantity 107-monthMME/day: 45 mg/day.  WRise Patience RN  65/08/2018 11:04 AM  Sign at close encounter Nursing Pain Medication Assessment:  Safety precautions to be maintained throughout the outpatient stay will include: orient to surroundings, keep  bed in low position, maintain call bell within reach at all times, provide assistance with transfer out of bed and ambulation.   Medication Inspection Compliance: Pill count conducted under aseptic conditions, in front of the patient. Neither the pills nor the bottle was removed from the patient's sight at any time. Once count was completed pills were immediately returned to the patient in their original bottle.  Medication: Oxycodone/APAP Pill/Patch Count: 21 of 90 pills remain Pill/Patch Appearance: Markings consistent with prescribed medication Bottle Appearance: Standard pharmacy container. Clearly labeled. Filled Date: 4 / 15 / 2019 Last Medication intake:  Today   Pharmacokinetics: Liberation and absorption (onset of action): WNL Distribution (time to peak effect): WNL Metabolism and excretion (duration of action): WNL         Pharmacodynamics: Desired effects: Analgesia: [name] [name] reports >50% benefit. Functional ability: Patient reports that medication allows her to accomplish basic ADLs Clinically meaningful improvement in function (CMIF): Sustained CMIF goals met Perceived effectiveness: Described as relatively effective, allowing for increase in activities of daily living (ADL) Undesirable effects: Side-effects or Adverse reactions: None reported Monitoring: [place] PMP: Online review of the past 65-monthperiod conducted. Compliant with practice rules and regulations Last UDS on record: Summary  Date Value Ref Range Status  08/13/2017 FINAL  Final    Comment:    ==================================================================== TOXASSURE COMP DRUG ANALYSIS,UR ==================================================================== Test                             Result       Flag       Units Drug Present and Declared for Prescription Verification   Oxycodone                      2498         EXPECTED   ng/mg creat   Oxymorphone                    160          EXPECTED   ng/mg creat   Noroxycodone                   3393         EXPECTED   ng/mg creat    Sources of oxycodone include scheduled  prescription medications.    Oxymorphone and noroxycodone are expected metabolites of    oxycodone. Oxymorphone is also available as a scheduled    prescription medication.   Gabapentin                     PRESENT      EXPECTED   Citalopram                     PRESENT      EXPECTED   Desmethylcitalopram            PRESENT      EXPECTED    Desmethylcitalopram is an expected metabolite of citalopram or    the enantiomeric form, escitalopram.   Acetaminophen                  PRESENT      EXPECTED Drug Absent but Declared for Prescription Verification   Tizanidine                     Not Detected UNEXPECTED    Tizanidine, as indicated in the declared  medication list, is not    always detected even when used as directed.   Salicylate                     Not Detected UNEXPECTED    Aspirin, as indicated in the declared medication list, is not    always detected even when used as directed. ==================================================================== Test                      Result    Flag   Units      Ref Range   Creatinine              94               mg/dL      >=20 ==================================================================== Declared Medications:  The flagging and interpretation on this report are based on the  following declared medications.  Unexpected results may arise from  inaccuracies in the declared medications.  **Note: The testing scope of this panel includes these medications:  Citalopram  Gabapentin  Oxycodone (Oxycodone Acetaminophen)  **Note: The testing scope of this panel does not include small to  moderate amounts of these reported medications:  Acetaminophen (Oxycodone Acetaminophen)  Aspirin (Aspirin 81)  Tizanidine  **Note: The testing scope of this panel does not include following  reported medications:  Carvedilol  Cetirizine  Furosemide  Glipizide  Losartan (Losartan Potassium)  Meloxicam   Prednisone ==================================================================== For clinical consultation, please call [phone]. ====================================================================    UDS interpretation: Compliant          Medication Assessment Form: Reviewed. Patient indicates being compliant with therapy Treatment compliance: Compliant Risk Assessment Profile: Aberrant behavior: See prior evaluations. None observed or detected today Comorbid factors increasing risk of overdose: See prior notes. No additional risks detected today Risk of substance use disorder (SUD): Low Opioid Risk Tool - 04/10/18 1057      Family History of Substance Abuse   Alcohol  Negative    Illegal Drugs  Negative    Rx Drugs  Negative      Personal History of Substance Abuse   Alcohol  Negative    Illegal Drugs  Negative    Rx Drugs  Negative      Age   Age between 58-45 years   No      Psychological Disease   Psychological Disease  Negative    Depression  Positive      Total Score   Opioid Risk Tool Scoring  1    Opioid Risk Interpretation  Low Risk      ORT Scoring interpretation table:  Score <3 = Low Risk for SUD  Score between 4-7 = Moderate Risk for SUD  Score >8 = High Risk for Opioid Abuse   Risk Mitigation Strategies:  Patient Counseling: Covered Patient-Prescriber Agreement (PPA): Present and active  Notification to other healthcare providers: Done  Pharmacologic Plan: No change in therapy, at this time.             Laboratory Chemistry  Inflammation Markers (CRP: Acute Phase) (ESR: Chronic Phase) Lab Results  Component Value Date   LATICACIDVEN 1.4 09/05/2015                         Rheumatology Markers No results found for: RF, ANA, LABURIC, URICUR, LYMEIGGIGMAB, LYMEABIGMQN  Renal Function Markers Lab Results  Component Value Date   BUN 18 09/06/2017   CREATININE 0.68 09/06/2017   GFRAA >60 09/06/2017   GFRNONAA >60  09/06/2017                              Hepatic Function Markers Lab Results  Component Value Date   AST 18 07/18/2017   ALT 13 07/18/2017   ALBUMIN 4.0 07/18/2017   ALKPHOS 75 07/18/2017   LIPASE 20 (L) 09/04/2015                        Electrolytes Lab Results  Component Value Date   NA 138 09/06/2017   K 3.5 09/06/2017   CL 98 (L) 09/06/2017   CALCIUM 7.7 (L) 09/06/2017                        Neuropathy Markers Lab Results  Component Value Date   HGBA1C 9.8 03/19/2018   HIV Non Reactive 07/18/2017                        Bone Pathology Markers No results found for: VD25OH, [id number], [id number], XH3716RC7, 25OHVITD1, 25OHVITD2, 25OHVITD3, TESTOFREE, TESTOSTERONE                       Coagulation Parameters Lab Results  Component Value Date   INR 0.97 08/26/2017   LABPROT 12.8 08/26/2017   APTT 24 08/26/2017   PLT 301 09/04/2017                        Cardiovascular Markers Lab Results  Component Value Date   CKTOTAL 62 11/04/2013   CKMB 1.2 11/04/2013   TROPONINI 1.00 (H) 09/05/2015   HGB 9.6 (L) 09/04/2017   HCT 29.8 (L) 09/04/2017                         CA Markers No results found for: CEA, CA125, LABCA2                      Note: Lab results reviewed.  Recent Diagnostic Imaging Results  DG Knee 1-2 Views Right CLINICAL DATA:  Knee pain  EXAM: RIGHT KNEE - 1-2 VIEW  COMPARISON:  09/02/2017  FINDINGS: No fracture or malalignment. Marked arthritis of the medial compartment of the knee with narrowing, spurring and sclerosis. Mild degenerative changes of the lateral and patellofemoral compartments. No large knee effusion.  IMPRESSION: Marked arthritis involving medial compartment of the right knee. No acute osseous abnormality.  Electronically Signed   By: Donavan Foil M.D.   On: 11/19/2017 15:45 DG Knee 1-2 Views Left CLINICAL DATA:  Bilateral knee pain for many years  EXAM: LEFT KNEE - 1-2 VIEW  COMPARISON:  CT 12/31/2013,  radiograph 09/02/2013  FINDINGS: Interval left medial hemiarthroplasty with intact appearing hardware. Slight lateral tilt of the femoral prosthetic. Mild degenerative change of the lateral compartment and patellofemoral compartment. Trace knee effusion. Probable hardware defects in the proximal to midshaft of the tibia. Prominent spurring of the tibial spines.  IMPRESSION: 1. No acute osseous abnormality. 2. Interval medial hemiarthroplasty of the left knee 3. Mild degenerative changes of the lateral and patellofemoral compartments with small knee effusion.  Electronically Signed   By: Madie Reno.D.  On: 11/19/2017 15:44 DG HIP UNILAT W OR W/O PELVIS 2-3 VIEWS RIGHT CLINICAL DATA:  Hip pain for many years  EXAM: DG HIP (WITH OR WITHOUT PELVIS) 2-3V RIGHT  COMPARISON:  None.  FINDINGS: No fracture or malalignment. Pubic symphysis and rami are intact. Moderate arthritis of the right hip with joint space narrowing, minimal sclerosis and osteophyte.  IMPRESSION: Moderate arthritis of the right hip.  No acute osseous abnormality.  Electronically Signed   By: Donavan Foil M.D.   On: 11/19/2017 15:40 DG HIP UNILAT W OR W/O PELVIS 2-3 VIEWS LEFT CLINICAL DATA:  Bilateral hip pain for many years  EXAM: DG HIP (WITH OR WITHOUT PELVIS) 2-3V LEFT  COMPARISON:  CT 09/05/2015  FINDINGS: Mild SI joint degenerative change. Pubic symphysis and rami are intact. Moderate arthritis of the left hip with joint space narrowing. No fracture or malalignment.  IMPRESSION: Moderate arthritis of the left hip. Mild SI joint degenerative changes.  Electronically Signed   By: Donavan Foil M.D.   On: 11/19/2017 15:39 DG Lumbar Spine Complete W/Bend CLINICAL DATA:  Chronic low back pain bilateral hip and knee pain for many years history of rheumatoid arthritis and fibromyalgia  EXAM: LUMBAR SPINE - COMPLETE WITH BENDING VIEWS  COMPARISON:  CT 09/05/2015  FINDINGS: Five non  rib bearing lumbar type vertebra. Vertebral body heights are normal. Mild anterior osteophytes of the lower thoracic spine. Mild degenerative changes at L2-L3. No significant change in alignment with flexion or extension.  IMPRESSION: Mild degenerative changes.  No acute osseous abnormality.  Electronically Signed   By: Donavan Foil M.D.   On: 11/19/2017 15:38  Complexity Note: Imaging results reviewed. Results shared with [name] [name], using Layman's terms.                         Meds   Current Outpatient Medications:  .  cetirizine (ZYRTEC) 10 MG tablet, TAKE 1 TABLET (10 MG TOTAL) BY MOUTH DAILY. (NOT COVER*), Disp: 90 tablet, Rfl: 3 .  citalopram (CELEXA) 40 MG tablet, Take 40 mg by mouth daily., Disp: , Rfl: 3 .  Dulaglutide (TRULICITY) 9.67 RF/1.6BW SOPN, Inject 0.75 mg into the skin once a week., Disp: 4 pen, Rfl: 3 .  gabapentin (NEURONTIN) 300 MG capsule, Take 3 capsules (900 mg total) by mouth 3 (three) times daily., Disp: 270 capsule, Rfl: 11 .  glipiZIDE (GLUCOTROL) 5 MG tablet, TAKE 1 TABLET BY MOUTH TWICE A DAY BEFORE MEALS, Disp: 60 tablet, Rfl: 0 .  glucose blood (ACCU-CHEK AVIVA PLUS) test strip, To check blood sugar once a day.  DX: E11.9, Disp: 100 each, Rfl: 1 .  oxyCODONE-acetaminophen (PERCOCET) 10-325 MG tablet, Take 1 tablet by mouth every 8 (eight) hours as needed for pain. For chronic pain To fill on or after: 04/15/18, 05/15/18, 06/13/18 To last for 30 days from fill date, Disp: 90 tablet, Rfl: 0  ROS  Constitutional: Denies any fever or chills Gastrointestinal: No reported hemesis, hematochezia, vomiting, or acute GI distress Musculoskeletal: Denies any acute onset joint swelling, redness, loss of ROM, or weakness Neurological: No reported episodes of acute onset apraxia, aphasia, dysarthria, agnosia, amnesia, paralysis, loss of coordination, or loss of consciousness  Allergies  [name] [name] is allergic to latex; doxycycline; and sulfa antibiotics.  [place]   Drug: [name] [name]  reports that she does not use drugs. Alcohol:  reports that she does not drink alcohol. Tobacco:  reports that she quit smoking about 21 years ago.  She has a 29.00 pack-year smoking history. She has never used smokeless tobacco. Medical:  has a past medical history of Arthritis, BRCA negative (07/2017), Cancer ([place]), Cellulitis of both lower extremities, CHF (congestive heart failure) ([place]), Coronary artery disease, Diabetes mellitus without complication ([place]), Family history of breast cancer (07/2017), Family history of ovarian cancer, Fibromyalgia, Fibromyalgia affecting shoulder region, Hyperlipidemia, Hypertension, and Spinal stenosis. Surgical: [name] [name]  has a past surgical history that includes Medial partial knee replacement (Left, 01/06/2014); Vascular Stent (11/04/2013); Anal fissurectomy (01/2004); Hernia repair; Umbilical hernia repair (N/A, 09/05/2015); Ablation saphenous vein w/ RFA; Coronary angioplasty; Dilation and curettage of uterus; Laparoscopic hysterectomy (N/A, 09/03/2017); and Cystoscopy (09/03/2017). Family: family history includes Aneurysm in her father; Breast cancer (age of onset: 85) in her sister; Breast cancer (age of onset: 72) in her sister; COPD in her father and sister; Cataracts in her father and mother; Colon cancer in her paternal uncle and paternal uncle; Dementia in her father and mother; Diabetes in her father; Fibromyalgia in her sister; Heart attack in her brother; Hypertension in her mother and sister; Melanoma in her father; Migraines in her brother and sister; Ovarian cancer (age of onset: 52) in her sister; Thyroid disease in her mother; Uterine cancer in her cousin and cousin.  Constitutional Exam  General appearance: Well nourished, well developed, and well hydrated. In no apparent acute distress Vitals:   04/10/18 1047  BP: (!) 152/99  Pulse: 90  Resp: 18  Temp: 98.3 F (36.8 C)  TempSrc: Oral  SpO2: 96%  Weight: (!) 320 lb (145.2  kg)  Height: _0  (1.727 m)   BMI Assessment: Estimated body mass index is 48.66 kg/m as calculated from the following:   Height as of this encounter: _1  (1.727 m).   Weight as of this encounter: 320 lb (145.2 kg).  BMI interpretation table: BMI level Category Range association with higher incidence of chronic pain  <18 kg/m2 Underweight   18.5-24.9 kg/m2 Ideal body weight   25-29.9 kg/m2 Overweight Increased incidence by 20%  30-34.9 kg/m2 Obese (Class I) Increased incidence by 68%  35-39.9 kg/m2 Severe obesity (Class II) Increased incidence by 136%  >40 kg/m2 Extreme obesity (Class [name]) Increased incidence by 254%   Patient's current BMI Ideal Body weight  Body mass index is 48.66 kg/m. Ideal body weight: 63.9 kg (140 lb 14 oz) Adjusted ideal body weight: 96.4 kg (212 lb 8.4 oz)   BMI Readings from Last 4 Encounters:  04/10/18 48.66 kg/m  03/19/18 49.75 kg/m  02/10/18 47.65 kg/m  01/22/18 48.35 kg/m   Wt Readings from Last 4 Encounters:  04/10/18 (!) 320 lb (145.2 kg)  03/19/18 (!) 332 lb (150.6 kg)  02/10/18 (!) 318 lb (144.2 kg)  01/22/18 (!) 318 lb (144.2 kg)  Psych/Mental status: Alert, oriented x 3 (person, place, & time)       Eyes: PERLA Respiratory: No evidence of acute respiratory distress  Cervical Spine Area Exam  Skin & Axial Inspection: No masses, redness, edema, swelling, or associated skin lesions Alignment: Symmetrical Functional ROM: Decreased ROM, to the right Stability: No instability detected Muscle Tone/Strength: Functionally intact. No obvious neuro-muscular anomalies detected. Sensory (Neurological): Musculoskeletal pain pattern Palpation: Complains of area being tender to palpation              Upper Extremity (UE) Exam    Side: Right upper extremity  Side: Left upper extremity  Skin & Extremity Inspection: Skin color, temperature, and hair growth are WNL. No peripheral  edema or cyanosis. No masses, redness, swelling, asymmetry, or  associated skin lesions. No contractures.  Skin & Extremity Inspection: Edema  Functional ROM: Unrestricted ROM          Functional ROM: Decreased ROM for shoulder and elbow  Muscle Tone/Strength: Functionally intact. No obvious neuro-muscular anomalies detected.  Muscle Tone/Strength: Functionally intact. No obvious neuro-muscular anomalies detected.  Sensory (Neurological): Unimpaired          Sensory (Neurological): Arthropathic arthralgia          Palpation: No palpable anomalies              Palpation: No palpable anomalies              Specialized Test(s): Deferred         Specialized Test(s): Deferred          Thoracic Spine Area Exam  Skin & Axial Inspection: No masses, redness, or swelling Alignment: Symmetrical Functional ROM: Unrestricted ROM Stability: No instability detected Muscle Tone/Strength: Functionally intact. No obvious neuro-muscular anomalies detected. Sensory (Neurological): Unimpaired Muscle strength & Tone: No palpable anomalies  Lumbar Spine Area Exam  Skin & Axial Inspection: No masses, redness, or swelling Alignment: Symmetrical Functional ROM: Decreased ROM affecting both sides Stability: No instability detected Muscle Tone/Strength: Functionally intact. No obvious neuro-muscular anomalies detected. Sensory (Neurological): Musculoskeletal pain pattern Palpation: No palpable anomalies       Provocative Tests: Lumbar Hyperextension and rotation test: Positive bilaterally for facet joint pain. Lumbar Lateral bending test: Positive due to pain. Patrick's Maneuver: evaluation deferred today                    Gait & Posture Assessment  Ambulation: Unassisted Gait: Relatively normal for age and body habitus Posture: WNL   Lower Extremity Exam    Side: Right lower extremity  Side: Left lower extremity  Stability: No instability observed          Stability: No instability observed          Skin & Extremity Inspection: Skin color, temperature, and hair growth  are WNL. No peripheral edema or cyanosis. No masses, redness, swelling, asymmetry, or associated skin lesions. No contractures.  Skin & Extremity Inspection: Skin color, temperature, and hair growth are WNL. No peripheral edema or cyanosis. No masses, redness, swelling, asymmetry, or associated skin lesions. No contractures.  Functional ROM: Unrestricted ROM                  Functional ROM: Unrestricted ROM                  Muscle Tone/Strength: Functionally intact. No obvious neuro-muscular anomalies detected.  Muscle Tone/Strength: Functionally intact. No obvious neuro-muscular anomalies detected.  Sensory (Neurological): Unimpaired  Sensory (Neurological): Unimpaired  Palpation: No palpable anomalies  Palpation: No palpable anomalies   Assessment  Primary Diagnosis & Pertinent Problem List: The primary encounter diagnosis was Primary osteoarthritis of left shoulder. Diagnoses of Cervicalgia, Primary osteoarthritis of both knees, Fibromyalgia, Spinal stenosis, lumbar region, with neurogenic claudication, Chronic pain syndrome, S/P total knee replacement, left, SI joint arthritis, Chronic venous insufficiency, Lumbar facet arthropathy, and Lumbar degenerative disc disease were also pertinent to this visit.  Status Diagnosis  Persistent Persistent Persistent 1. Primary osteoarthritis of left shoulder   2. Cervicalgia   3. Primary osteoarthritis of both knees   4. Fibromyalgia   5. Spinal stenosis, lumbar region, with neurogenic claudication   6. Chronic pain syndrome   7. S/P total knee  replacement, left   8. SI joint arthritis   9. Chronic venous insufficiency   10. Lumbar facet arthropathy   11. Lumbar degenerative disc disease      General Recommendations: The pain condition that the patient suffers from is best treated with a multidisciplinary approach that involves an increase in physical activity to prevent de-conditioning and worsening of the pain cycle, as well as psychological  counseling (formal and/or informal) to address the co-morbid psychological affects of pain. Treatment will often involve judicious use of pain medications and interventional procedures to decrease the pain, allowing the patient to participate in the physical activity that will ultimately produce long-lasting pain reductions. The goal of the multidisciplinary approach is to return the patient to a higher level of overall function and to restore their ability to perform activities of daily living.  67 year old female with a history of morbid obesity, depression, type 2 diabetes, GI ulcer, hypertension who presents with diffuse body pain including arthralgias of multiple joints secondary to fibromyalgia and low back pain secondary to lumbar degenerative disc disease, lumbar spinal stenosis,and bilateral knee pain secondary to knee osteoarthritis and morbid obesity. Patient has been on chronic opioid therapy for more than 10 years.Of note patient's husband has metastatic cancer and this is been a huge concern for the patient as she is his primary caregiver.  At the last visit on 02/10/2018, cervical spine CT was ordered however this was denied by [facility].  Patient wants to wait until she has Medicare in October at which point we can replace order and hopefully have it approved through Medicare.  Today we will refill her Percocet as below.  Encourage the patient to continue with her mindfulness therapies, meditation which could all help with pain coping.  Patient to follow-up in 3 months with Crystal for medication management.  Can place repeat order for CT cervical spine in October when patient has Medicare.   Plan of Care  Pharmacotherapy (Medications Ordered): Meds ordered this encounter  Medications  . DISCONTD: oxyCODONE-acetaminophen (PERCOCET) 10-325 MG tablet    Sig: Take 1 tablet by mouth every 8 (eight) hours as needed for pain. For chronic pain To fill on or after: 04/15/18,  05/15/18, 06/13/18 To last for 30 days from fill date    Dispense:  90 tablet    Refill:  0    Do not place this medication, or any other prescription from our practice, on "Automatic Refill". Patient may have prescription filled one day early if pharmacy is closed on scheduled refill date.  [name] DISCONTD: oxyCODONE-acetaminophen (PERCOCET) 10-325 MG tablet    Sig: Take 1 tablet by mouth every 8 (eight) hours as needed for pain. For chronic pain To fill on or after: 04/15/18, 05/15/18, 06/13/18 To last for 30 days from fill date    Dispense:  90 tablet    Refill:  0    Do not place this medication, or any other prescription from our practice, on "Automatic Refill". Patient may have prescription filled one day early if pharmacy is closed on scheduled refill date.  [name] oxyCODONE-acetaminophen (PERCOCET) 10-325 MG tablet    Sig: Take 1 tablet by mouth every 8 (eight) hours as needed for pain. For chronic pain To fill on or after: 04/15/18, 05/15/18, 06/13/18 To last for 30 days from fill date    Dispense:  90 tablet    Refill:  0    Do not place this medication, or any other prescription from our practice, on "  Automatic Refill". Patient may have prescription filled one day early if pharmacy is closed on scheduled refill date.   lab-work, procedure(s), and/or referral(s): No orders of the defined types were placed in this encounter.   Provider-requested follow-up: Return in about 3 months (around 07/11/2018) for MM with Crystal. Time Note: Greater than 50% of the 25 minute(s) of face-to-face time spent with [name] [name], was spent in counseling/coordination of care regarding: [name] [name] primary cause of pain, the treatment plan, medication side effects, the opioid analgesic risks and possible complications, the appropriate use of her medications, realistic expectations, the goals of pain management (increased in functionality), the need to bring and keep the BMI below 30, the medication agreement and the  patient's responsibilities when it comes to controlled substances.  Future Appointments  Date Time Provider [place]  04/21/2018  3:00 PM [facility] None  04/30/2018 11:20 AM Bacigalupo, Dionne Bucy, MD [facility] None  07/10/2018 10:30 AM Vevelyn Francois, NP [facility] None    Primary Care Physician: Virginia Crews, MD Location: [facility] Note by: Gillis Santa, M.D Date: 04/10/2018; Time: 12:42 PM  There are no Patient Instructions on file for this visit.

## 2018-04-12 ENCOUNTER — Other Ambulatory Visit: Payer: Self-pay | Admitting: Family Medicine

## 2018-04-12 DIAGNOSIS — E11628 Type 2 diabetes mellitus with other skin complications: Secondary | ICD-10-CM

## 2018-04-21 ENCOUNTER — Ambulatory Visit: Payer: BLUE CROSS/BLUE SHIELD | Admitting: Urology

## 2018-04-21 ENCOUNTER — Encounter: Payer: Self-pay | Admitting: Urology

## 2018-04-21 VITALS — BP 130/80 | HR 82 | Resp 16 | Ht 68.0 in | Wt 320.0 lb

## 2018-04-21 DIAGNOSIS — R32 Unspecified urinary incontinence: Secondary | ICD-10-CM | POA: Diagnosis not present

## 2018-04-21 DIAGNOSIS — N3946 Mixed incontinence: Secondary | ICD-10-CM | POA: Diagnosis not present

## 2018-04-21 LAB — URINALYSIS, COMPLETE
Bilirubin, UA: NEGATIVE
Ketones, UA: NEGATIVE
Nitrite, UA: NEGATIVE
Protein, UA: NEGATIVE
Specific Gravity, UA: 1.02 (ref 1.005–1.030)
Urobilinogen, Ur: 0.2 mg/dL (ref 0.2–1.0)
pH, UA: 5 (ref 5.0–7.5)

## 2018-04-21 LAB — MICROSCOPIC EXAMINATION

## 2018-04-21 NOTE — Progress Notes (Signed)
04/21/2018 3:30 PM   [name] [name] 11/28/1951 [id number]  Referring provider: Virginia Crews, MD [address] [place], [place] [address]  No chief complaint on file.   HPI: Patient was consulted to be assessed for worsening urinary incontinence since a hysterectomy in October 2018.  She had been incontinent for years previous.  She now cannot leave her house based on severity.  She leaks with coughing sneezing bending lifting a significant amount.  She wears 10-15 pads a day that are soaked.  She has urge incontinence.  She can run water and leak without awareness.  She will leak when she goes from a sitting to standing position.  She has foot on the floor syndrome.  She has mild bedwetting but not every night  She voids every 30 to 45 minutes and cannot hold it for 2 hours.  She gets up 5-6 times a night.  She has left ankle edema.  She has had one bladder infection.  She has had a hysterectomy.  She has not had previous bladder surgery or kidney stones in the presentation is not been medically treated  Modifying factors: There are no other modifying factors  Associated signs and symptoms: There are no other associated signs and symptoms Aggravating and relieving factors: There are no other aggravating or relieving factors Severity: Moderate Duration: Persistent   PMH: Past Medical History:  Diagnosis Date  . Arthritis    Rhumetoid arthritis  . BRCA negative 07/2017   MyRisk neg  . Cancer ([place])    uterine  . Cellulitis of both lower extremities    Chronic  . CHF (congestive heart failure) ([place])   . Coronary artery disease   . Diabetes mellitus without complication ([place])   . Family history of breast cancer 07/2017   MyRisk neg; IBIS=10.5%/riskscore=17%  . Family history of ovarian cancer   . Fibromyalgia   . Fibromyalgia affecting shoulder region   . Hyperlipidemia   . Hypertension   . Spinal stenosis     Surgical History: Past Surgical History:   Procedure Laterality Date  . ABLATION SAPHENOUS VEIN W/ RFA    . ANAL FISSURECTOMY  01/2004  . CORONARY ANGIOPLASTY    . CYSTOSCOPY  09/03/2017   Procedure: CYSTOSCOPY;  Surgeon: Gae Dry, MD;  Location: [facility] ORS;  Service: Gynecology;;  . DILATION AND CURETTAGE OF UTERUS    . HERNIA REPAIR    . LAPAROSCOPIC HYSTERECTOMY N/A 09/03/2017   Procedure: HYSTERECTOMY TOTAL LAPAROSCOPIC BSO;  Surgeon: Gae Dry, MD;  Location: [facility] OR[facility];  Service: Gynecology;  Laterality: N/A;  . MEDIAL PARTIAL KNEE REPLACEMENT Left 01/06/2014  . UMBILICAL HERNIA REPAIR N/A 09/05/2015   Procedure: HERNIA REPAIR incarcerated UMBILICAL ADULT;  Surgeon: Sherri Rad, MD;  Location: [facility] ORS;  Service: General;  Laterality: N/A;  . Vascular Stent  11/04/2013    Home Medications:  Allergies as of 04/21/2018      Reactions   Latex Itching   itching   Doxycycline Rash   Sulfa Antibiotics Itching, Rash, Swelling      Medication List        Accurate as of 04/21/18  3:30 PM. Always use your most recent med list.          cetirizine 10 MG tablet Commonly known as:  ZYRTEC TAKE 1 TABLET (10 MG TOTAL) BY MOUTH DAILY. (NOT COVER*)   citalopram 40 MG tablet Commonly known as:  CELEXA Take 40 mg by mouth daily.   Dulaglutide  0.75 MG/0.5ML Sopn Commonly known as:  TRULICITY Inject 7.62 mg into the skin once a week.   gabapentin 300 MG capsule Commonly known as:  NEURONTIN Take 3 capsules (900 mg total) by mouth 3 (three) times daily.   glipiZIDE 5 MG tablet Commonly known as:  GLUCOTROL TAKE 1 TABLET BY MOUTH TWICE A DAY BEFORE MEALS   glucose blood test strip Commonly known as:  ACCU-CHEK AVIVA PLUS To check blood sugar once a day.  DX: E11.9   oxyCODONE-acetaminophen 10-325 MG tablet Commonly known as:  PERCOCET Take 1 tablet by mouth every 8 (eight) hours as needed for pain. For chronic pain To fill on or after: 04/15/18, 05/15/18, 06/13/18 To last for 30 days from fill date        Allergies:  Allergies  Allergen Reactions  . Latex Itching    itching  . Doxycycline Rash  . Sulfa Antibiotics Itching, Rash and Swelling    Family History: Family History  Problem Relation Age of Onset  . Hypertension Mother   . Cataracts Mother   . Thyroid disease Mother   . Dementia Mother   . COPD Father   . Dementia Father   . Cataracts Father   . Aneurysm Father        Abdominal & Brain  . Diabetes Father   . Melanoma Father   . Breast cancer Sister 69  . Fibromyalgia Sister   . Migraines Sister   . Hypertension Sister   . Breast cancer Sister 41  . COPD Sister   . Ovarian cancer Sister 62  . Migraines Brother   . Heart attack Brother   . Colon cancer Paternal Uncle        59s  . Colon cancer Paternal Uncle        69s  . Uterine cancer Cousin   . Uterine cancer Cousin     Social History:  reports that she quit smoking about 21 years ago. She has a 29.00 pack-year smoking history. She has never used smokeless tobacco. She reports that she does not drink alcohol or use drugs.  ROS: UROLOGY Frequent Urination?: Yes Hard to postpone urination?: Yes Burning/pain with urination?: No Get up at night to urinate?: Yes Leakage of urine?: Yes Urine stream starts and stops?: Yes Trouble starting stream?: Yes Do you have to strain to urinate?: No Blood in urine?: No Urinary tract infection?: No Sexually transmitted disease?: No Injury to kidneys or bladder?: No Painful intercourse?: No Weak stream?: Yes Currently pregnant?: No Vaginal bleeding?: No  Gastrointestinal Nausea?: No Vomiting?: No Indigestion/heartburn?: No Diarrhea?: No Constipation?: Yes  Constitutional Fever: No Night sweats?: Yes Weight loss?: No Fatigue?: Yes  Skin Skin rash/lesions?: No Itching?: No  Eyes Blurred vision?: Yes Double vision?: No  Ears/Nose/Throat Sore throat?: No Sinus problems?: Yes  Hematologic/Lymphatic Swollen glands?: No Easy bruising?:  Yes  Cardiovascular Leg swelling?: Yes Chest pain?: No  Respiratory Cough?: No Shortness of breath?: No  Endocrine Excessive thirst?: Yes  Musculoskeletal Back pain?: Yes Joint pain?: Yes  Neurological Headaches?: No Dizziness?: Yes  Psychologic Depression?: Yes Anxiety?: Yes  Physical Exam: BP 130/80   Pulse 82   Resp 16   Ht _0  (1.727 m)   Wt (!) 320 lb (145.2 kg)   LMP  (LMP Unknown) Comment: age 87  SpO2 96%   BMI 48.66 kg/m   Constitutional:  Alert and oriented, No acute distress. HEENT:  AT, moist mucus membranes.  Trachea midline, no masses. Cardiovascular: No  clubbing, cyanosis, or edema. Respiratory: Normal respiratory effort, no increased work of breathing. GI: Abdomen is soft, nontender, nondistended, no abdominal masses GU: The pelvic examination was a little bit limited by her obesity.  She had grade 2 hypermobility the bladder neck and a negative cough test but her bladder I believe is quite empty.  She had no cystocele.  She had a small rectocele.  She had a large suprapubic fat pad Skin: No rashes, bruises or suspicious lesions. Lymph: No cervical or inguinal adenopathy. Neurologic: Grossly intact, no focal deficits, moving all 4 extremities. Psychiatric: Normal mood and affect.  Laboratory Data: Lab Results  Component Value Date   WBC 7.5 09/04/2017   HGB 9.6 (L) 09/04/2017   HCT 29.8 (L) 09/04/2017   MCV 81.2 09/04/2017   PLT 301 09/04/2017    Lab Results  Component Value Date   CREATININE 0.68 09/06/2017    No results found for: PSA  No results found for: TESTOSTERONE  Lab Results  Component Value Date   HGBA1C 9.8 03/19/2018    Urinalysis    Component Value Date/Time   COLORURINE YELLOW (A) 09/04/2015 2209   APPEARANCEUR TURBID (A) 09/04/2015 2209   LABSPEC 1.016 09/04/2015 2209   PHURINE 7.0 09/04/2015 2209   GLUCOSEU NEGATIVE 09/04/2015 2209   HGBUR NEGATIVE 09/04/2015 2209   BILIRUBINUR neg 07/31/2017 1117    KETONESUR 1+ (A) 09/04/2015 2209   PROTEINUR trace 07/31/2017 1117   PROTEINUR NEGATIVE 09/04/2015 2209   NITRITE neg 07/31/2017 1117   NITRITE NEGATIVE 09/04/2015 2209   LEUKOCYTESUR Moderate (2+) (A) 07/31/2017 1117    Pertinent Imaging:   Assessment & Plan: Patient has high-volume mixed incontinence with typical triggers.  She has intermittent milder bedwetting.  She has severe frequency.  She has significant nocturia.  The role of urodynamics and cystoscopy discussed.  Call if urine culture is positive.  If the patient ever needed a sling she would need a mini sling and not a retropubic   1. Urinary incontinence, unspecified type  - Urinalysis, Complete   Return in about 1 month (around 05/19/2018) for UDS [place].  Reece Packer, MD  [facility] [address], [place], [place] [address] [phone]

## 2018-04-21 NOTE — Progress Notes (Signed)
In and Out Catheterization  Patient is present today for a I & O catheterization due to urinary incontinence. Patient was cleaned and prepped in a sterile fashion with betadine and Lidocaine 2% jelly was instilled into the urethra.  A 14FR cath was inserted no complications were noted , 19ml of urine return was noted, urine was yellow in color. A clean urine sample was collected for UA and culture. Bladder was drained  And catheter was removed with out difficulty.    Preformed by: Fonnie Jarvis, CMA

## 2018-04-23 LAB — CULTURE, URINE COMPREHENSIVE

## 2018-04-24 ENCOUNTER — Telehealth: Payer: Self-pay | Admitting: Family Medicine

## 2018-04-24 MED ORDER — NITROFURANTOIN MACROCRYSTAL 100 MG PO CAPS: 100.0000 mg | ORAL_CAPSULE | Freq: Four times a day (QID) | ORAL | 0 refills | Status: DC

## 2018-04-24 NOTE — Telephone Encounter (Signed)
LMOM for patient to pick up ABX from pharmacy 

## 2018-04-24 NOTE — Telephone Encounter (Signed)
-----   Message from Bjorn Loser, MD sent at 04/24/2018 11:58 AM EDT ----- Macrodantin 100 mg bid for 7 days    ----- Message ----- From: Kyra Manges, CMA Sent: 04/24/2018   8:08 AM To: Bjorn Loser, MD    ----- Message ----- From: Interface, [facility] Lab Results In Sent: 04/21/2018   4:37 PM To: Rowe Robert Clinical

## 2018-04-30 ENCOUNTER — Ambulatory Visit: Payer: Self-pay | Admitting: Family Medicine

## 2018-05-29 ENCOUNTER — Other Ambulatory Visit: Payer: Self-pay | Admitting: Urology

## 2018-06-02 ENCOUNTER — Encounter: Payer: Self-pay | Admitting: Urology

## 2018-06-02 ENCOUNTER — Ambulatory Visit: Payer: BLUE CROSS/BLUE SHIELD | Admitting: Urology

## 2018-06-02 VITALS — BP 145/79 | HR 93 | Ht 68.0 in | Wt 338.4 lb

## 2018-06-02 DIAGNOSIS — N3946 Mixed incontinence: Secondary | ICD-10-CM

## 2018-06-02 MED ORDER — MIRABEGRON ER 50 MG PO TB24: 50.0000 mg | ORAL_TABLET | Freq: Every day | ORAL | 11 refills | Status: DC

## 2018-06-02 NOTE — Progress Notes (Signed)
06/02/2018 11:32 AM   [name] 24-Apr-1951 [id number]  Referring provider: Virginia Crews, [place], [place] [address]  Chief Complaint  Patient presents with  . Follow-up    UDS results    HPI: Patient was consulted to be assessed for worsening urinary incontinence since a hysterectomy in October 2018.  She had been incontinent for years previous.  She now cannot leave her house based on severity.  She leaks with coughing sneezing bending lifting a significant amount.  She wears 10-15 pads a day that are soaked.  She has urge incontinence.  She can run water and leak without awareness.  She will leak when she goes from a sitting to standing position.  She has foot on the floor syndrome.  She has mild bedwetting but not every night  She voids every 30 to 45 minutes and cannot hold it for 2 hours.  She gets up 5-6 times a night.  She has left ankle edema.  The pelvic examination was a little bit limited by her obesity.  She had grade 2 hypermobility the bladder neck and a negative cough test but her bladder I believe is quite empty.  She had no cystocele.  She had a small rectocele.  She had a large suprapubic fat pad  Patient has high-volume mixed incontinence with typical triggers.  She has intermittent milder bedwetting.  She has severe frequency.  She has significant nocturia.  Call if urine culture is positive.  If the patient ever needed a sling she would need a mini sling and not a retropubic   Today Frequency stable.  Last urine culture positive On urodynamics she did not void but her bladder was empty.  Her maximum bladder capacity was 100 mL.  She had sensory urgency.  The maximum detrusor pressure was 38 cm water occurring at 62 mL.  She had to void off the contraction.  She had no leakage associated with coughing reaching a pressure of 148 cm of water.  She was triggering instability.  The instability was provoked and unprovoked  throughout.  During voluntary voiding she voided 76 mils of maximum voiding pressure 21 cm of water.  She emptied efficiently.  Bladder neck descent at 1 cm.  The details of the urodynamics are signed and dictated  PMH: Past Medical History:  Diagnosis Date  . Arthritis    Rhumetoid arthritis  . BRCA negative 07/2017   MyRisk neg  . Cancer ([place])    uterine  . Cellulitis of both lower extremities    Chronic  . CHF (congestive heart failure) ([place])   . Coronary artery disease   . Diabetes mellitus without complication ([place])   . Family history of breast cancer 07/2017   MyRisk neg; IBIS=10.5%/riskscore=17%  . Family history of ovarian cancer   . Fibromyalgia   . Fibromyalgia affecting shoulder region   . Hyperlipidemia   . Hypertension   . Spinal stenosis     Surgical History: Past Surgical History:  Procedure Laterality Date  . ABLATION SAPHENOUS VEIN W/ RFA    . ANAL FISSURECTOMY  01/2004  . CORONARY ANGIOPLASTY    . CYSTOSCOPY  09/03/2017   Procedure: CYSTOSCOPY;  Surgeon: Gae Dry, MD;  Location: [facility] ORS;  Service: Gynecology;;  . DILATION AND CURETTAGE OF UTERUS    . HERNIA REPAIR    . LAPAROSCOPIC HYSTERECTOMY N/A 09/03/2017   Procedure: HYSTERECTOMY TOTAL LAPAROSCOPIC BSO;  Surgeon: Gae Dry, MD;  Location:  [facility] ORS;  Service: Gynecology;  Laterality: N/A;  . MEDIAL PARTIAL KNEE REPLACEMENT Left 01/06/2014  . UMBILICAL HERNIA REPAIR N/A 09/05/2015   Procedure: HERNIA REPAIR incarcerated UMBILICAL ADULT;  Surgeon: Sherri Rad, MD;  Location: [facility] ORS;  Service: General;  Laterality: N/A;  . Vascular Stent  11/04/2013    Home Medications:  Allergies as of 06/02/2018      Reactions   Latex Itching   itching   Doxycycline Rash   Sulfa Antibiotics Itching, Rash, Swelling      Medication List        Accurate as of 06/02/18 11:32 AM. Always use your most recent med list.          cetirizine 10 MG tablet Commonly known as:  ZYRTEC TAKE 1 TABLET (10 MG  TOTAL) BY MOUTH DAILY. (NOT COVER*)   citalopram 40 MG tablet Commonly known as:  CELEXA Take 40 mg by mouth daily.   Dulaglutide 0.75 MG/0.5ML Sopn Commonly known as:  TRULICITY Inject 7.91 mg into the skin once a week.   gabapentin 300 MG capsule Commonly known as:  NEURONTIN Take 3 capsules (900 mg total) by mouth 3 (three) times daily.   glipiZIDE 5 MG tablet Commonly known as:  GLUCOTROL TAKE 1 TABLET BY MOUTH TWICE A DAY BEFORE MEALS   glucose blood test strip Commonly known as:  ACCU-CHEK AVIVA PLUS To check blood sugar once a day.  DX: E11.9   mirabegron ER 50 MG Tb24 tablet Commonly known as:  MYRBETRIQ Take 1 tablet (50 mg total) by mouth daily.   oxyCODONE-acetaminophen 10-325 MG tablet Commonly known as:  PERCOCET Take 1 tablet by mouth every 8 (eight) hours as needed for pain. For chronic pain To fill on or after: 04/15/18, 05/15/18, 06/13/18 To last for 30 days from fill date       Allergies:  Allergies  Allergen Reactions  . Latex Itching    itching  . Doxycycline Rash  . Sulfa Antibiotics Itching, Rash and Swelling    Family History: Family History  Problem Relation Age of Onset  . Hypertension Mother   . Cataracts Mother   . Thyroid disease Mother   . Dementia Mother   . COPD Father   . Dementia Father   . Cataracts Father   . Aneurysm Father        Abdominal & Brain  . Diabetes Father   . Melanoma Father   . Breast cancer Sister 30  . Fibromyalgia Sister   . Migraines Sister   . Hypertension Sister   . Breast cancer Sister 21  . COPD Sister   . Ovarian cancer Sister 32  . Migraines Brother   . Heart attack Brother   . Colon cancer Paternal Uncle        91s  . Colon cancer Paternal Uncle        63s  . Uterine cancer Cousin   . Uterine cancer Cousin     Social History:  reports that she quit smoking about 21 years ago. She has a 29.00 pack-year smoking history. She has never used smokeless tobacco. She reports that she does not  drink alcohol or use drugs.  ROS: UROLOGY Frequent Urination?: Yes Hard to postpone urination?: Yes Burning/pain with urination?: No Get up at night to urinate?: Yes Leakage of urine?: Yes Urine stream starts and stops?: Yes Trouble starting stream?: Yes Do you have to strain to urinate?: Yes Blood in urine?: No Urinary tract infection?: No Sexually transmitted  disease?: No Injury to kidneys or bladder?: No Painful intercourse?: No Weak stream?: No Currently pregnant?: No Vaginal bleeding?: No Last menstrual period?: n  Gastrointestinal Nausea?: Yes Vomiting?: No Indigestion/heartburn?: Yes Diarrhea?: No Constipation?: Yes  Constitutional Fever: Yes Night sweats?: Yes Weight loss?: No Fatigue?: Yes  Skin Skin rash/lesions?: No Itching?: Yes  Eyes Blurred vision?: Yes Double vision?: No  Ears/Nose/Throat Sore throat?: No Sinus problems?: Yes  Hematologic/Lymphatic Swollen glands?: No Easy bruising?: Yes  Cardiovascular Leg swelling?: Yes Chest pain?: Yes  Respiratory Cough?: No Shortness of breath?: No  Endocrine Excessive thirst?: Yes  Musculoskeletal Back pain?: Yes Joint pain?: Yes  Neurological Headaches?: Yes Dizziness?: Yes  Psychologic Depression?: Yes Anxiety?: Yes  Physical Exam: BP (!) 145/79 (BP Location: Right Arm, Patient Position: Sitting, Cuff Size: Normal)   Pulse 93   Ht '5\' 8"'  (1.727 m)   Wt (!) 153.5 kg (338 lb 6.4 oz)   LMP  (LMP Unknown) Comment: age 83  BMI 51.45 kg/m   Constitutional:  Alert and oriented, No acute distress.  Laboratory Data: Lab Results  Component Value Date   WBC 7.5 09/04/2017   HGB 9.6 (L) 09/04/2017   HCT 29.8 (L) 09/04/2017   MCV 81.2 09/04/2017   PLT 301 09/04/2017    Lab Results  Component Value Date   CREATININE 0.68 09/06/2017    No results found for: PSA  No results found for: TESTOSTERONE  Lab Results  Component Value Date   HGBA1C 9.8 03/19/2018    Urinalysis     Component Value Date/Time   COLORURINE YELLOW (A) 09/04/2015 2209   APPEARANCEUR Cloudy (A) 04/21/2018 1535   LABSPEC 1.016 09/04/2015 2209   PHURINE 7.0 09/04/2015 2209   GLUCOSEU 3+ (A) 04/21/2018 1535   HGBUR NEGATIVE 09/04/2015 2209   BILIRUBINUR Negative 04/21/2018 1535   KETONESUR 1+ (A) 09/04/2015 2209   PROTEINUR Negative 04/21/2018 1535   PROTEINUR NEGATIVE 09/04/2015 2209   NITRITE Negative 04/21/2018 1535   NITRITE NEGATIVE 09/04/2015 2209   LEUKOCYTESUR Trace (A) 04/21/2018 1535    Pertinent Imaging:   Assessment & Plan: Stable incontinence and frequency.  At least 90% of the patient's problem is not overactive bladder.  If she fails medical and behavioral therapy I would be offering her a refractory overactive bladder therapy.  If she truly does have stress incontinence this could persist.  The patient does not think treating her positive culture helped her symptoms.  I will send another culture today just in case.  I will see her back in approximately 6 weeks with Myrbetriq samples and prescription.  Based upon body habitus Botox and InterStim would not be ideal  There are no diagnoses linked to this encounter.  No follow-ups on file.  Reece Packer, MD  [facility] [address], [place], [place] [address] [phone]

## 2018-06-02 NOTE — Addendum Note (Signed)
Addended by: Garnette Gunner on: 06/02/2018 04:19 PM   Modules accepted: Orders

## 2018-06-03 LAB — URINALYSIS, COMPLETE
Bilirubin, UA: NEGATIVE
Ketones, UA: NEGATIVE
Leukocytes, UA: NEGATIVE
Nitrite, UA: NEGATIVE
Protein, UA: NEGATIVE
RBC, UA: NEGATIVE
Specific Gravity, UA: 1.02 (ref 1.005–1.030)
Urobilinogen, Ur: 0.2 mg/dL (ref 0.2–1.0)
pH, UA: 5.5 (ref 5.0–7.5)

## 2018-06-03 LAB — MICROSCOPIC EXAMINATION

## 2018-06-05 LAB — CULTURE, URINE COMPREHENSIVE

## 2018-06-18 ENCOUNTER — Ambulatory Visit: Payer: BLUE CROSS/BLUE SHIELD | Admitting: Family Medicine

## 2018-06-23 ENCOUNTER — Ambulatory Visit: Payer: BLUE CROSS/BLUE SHIELD | Admitting: Family Medicine

## 2018-06-23 ENCOUNTER — Encounter: Payer: Self-pay | Admitting: Family Medicine

## 2018-06-23 VITALS — BP 134/82 | HR 91 | Temp 98.3°F | Resp 24 | Wt 334.0 lb

## 2018-06-23 DIAGNOSIS — I872 Venous insufficiency (chronic) (peripheral): Secondary | ICD-10-CM | POA: Diagnosis not present

## 2018-06-23 DIAGNOSIS — G63 Polyneuropathy in diseases classified elsewhere: Secondary | ICD-10-CM | POA: Diagnosis not present

## 2018-06-23 DIAGNOSIS — L03116 Cellulitis of left lower limb: Secondary | ICD-10-CM | POA: Diagnosis not present

## 2018-06-23 MED ORDER — CEPHALEXIN 500 MG PO CAPS: 500.0000 mg | ORAL_CAPSULE | Freq: Four times a day (QID) | ORAL | 0 refills | Status: AC

## 2018-06-23 NOTE — Progress Notes (Signed)
Patient: [name] [name] Female    DOB: 10-29-1951   67 y.o.   MRN: [id number] Visit Date: 06/23/2018  Today's Provider: Lavon Paganini, MD   I, Martha Clan, CMA, am acting as scribe for Lavon Paganini, MD.  Chief Complaint  Patient presents with  . Cellulitis   Subjective:    HPI   Pt has a H/O recurrent cellulitis. This has been severe enough in the past that she had to be hospitalized. She states this is not severe enough to be hospitalized currently, but she would like abx before this becomes severe. It has been present for ~3-4 weeks and worsening. There is redness, no tenderness (due to neuropathy), warmth, oozing.  She has been using abx ointment and alcohol swabs and thinks this has kept it at [place].  She would like to discuss her peripheral neuropathy. She states this is worsening. She sees pain management. She states the pain medications are no longer improving sx.  She is taking gabapentin 900mg  TID.  She is also on chronic narcotics.   Allergies  Allergen Reactions  . Latex Itching    itching  . Doxycycline Rash  . Sulfa Antibiotics Itching, Rash and Swelling     Current Outpatient Medications:  .  cetirizine (ZYRTEC) 10 MG tablet, TAKE 1 TABLET (10 MG TOTAL) BY MOUTH DAILY. (NOT COVER*), Disp: 90 tablet, Rfl: 3 .  citalopram (CELEXA) 40 MG tablet, Take 40 mg by mouth daily., Disp: , Rfl: 3 .  Dulaglutide (TRULICITY) 9.37 TK/2.4OX SOPN, Inject 0.75 mg into the skin once a week., Disp: 4 pen, Rfl: 3 .  gabapentin (NEURONTIN) 300 MG capsule, Take 3 capsules (900 mg total) by mouth 3 (three) times daily., Disp: 270 capsule, Rfl: 11 .  glipiZIDE (GLUCOTROL) 5 MG tablet, TAKE 1 TABLET BY MOUTH TWICE A DAY BEFORE MEALS, Disp: 60 tablet, Rfl: 5 .  glucose blood (ACCU-CHEK AVIVA PLUS) test strip, To check blood sugar once a day.  DX: E11.9, Disp: 100 each, Rfl: 1 .  mirabegron ER (MYRBETRIQ) 50 MG TB24 tablet, Take 1 tablet (50 mg total) by mouth daily., Disp: 30  tablet, Rfl: 11 .  oxyCODONE-acetaminophen (PERCOCET) 10-325 MG tablet, Take 1 tablet by mouth every 8 (eight) hours as needed for pain. For chronic pain To fill on or after: 04/15/18, 05/15/18, 06/13/18 To last for 30 days from fill date, Disp: 90 tablet, Rfl: 0  Review of Systems  Constitutional: Positive for appetite change (decreased) and fatigue. Negative for activity change, chills, diaphoresis, fever and unexpected weight change.  HENT: Negative.   Respiratory: Negative.  Negative for shortness of breath.   Cardiovascular: Positive for leg swelling. Negative for chest pain and palpitations.  Gastrointestinal: Negative.   Genitourinary: Negative.   Musculoskeletal: Positive for arthralgias, back pain, myalgias and neck pain.  Skin: Positive for rash.  Neurological: Negative.   Psychiatric/Behavioral: Negative.     Social History   Tobacco Use  . Smoking status: Former Smoker    Packs/day: 1.00    Years: 29.00    Pack years: 29.00    Last attempt to quit: 12/03/1996    Years since quitting: 21.5  . Smokeless tobacco: Never Used  Substance Use Topics  . Alcohol use: No   Objective:   BP 134/82 (BP Location: Left Wrist, Patient Position: Sitting, Cuff Size: Large)   Pulse 91   Temp 98.3 F (36.8 C) (Oral)   Resp (!) 24   Wt (!) 334 lb (151.5  kg)   LMP  (LMP Unknown) Comment: age 69  SpO2 (!) 88%   BMI 50.78 kg/m  Vitals:   06/23/18 0947  BP: 134/82  Pulse: 91  Resp: (!) 24  Temp: 98.3 F (36.8 C)  TempSrc: Oral  SpO2: (!) 88%  Weight: (!) 334 lb (151.5 kg)     Physical Exam  Constitutional: She is oriented to person, place, and time. She appears well-developed and well-nourished. No distress.  HENT:  Head: Normocephalic and atraumatic.  Eyes: Conjunctivae are normal. Right eye exhibits no discharge. Left eye exhibits no discharge. No scleral icterus.  Cardiovascular: Normal rate, regular rhythm and normal heart sounds.  Pulmonary/Chest: Effort normal and  breath sounds normal. No stridor. No respiratory distress.  Musculoskeletal: She exhibits no edema.  Neurological: She is alert and oriented to person, place, and time.  Skin: Skin is warm and dry. There is erythema (see picture).  Psychiatric: She has a normal mood and affect. Her behavior is normal.  Vitals reviewed.           Assessment & Plan:   1. Cellulitis of left lower extremity -Exam consistent with cellulitis of left lower extremity today -No systemic symptoms - No abscesses to suggest MRSA - As patient is allergic to doxycycline and sulfa antibiotics, we will treat with Keflex 4 times daily x7 days -Discussed return precautions  2. Chronic venous insufficiency -Likely contributes to recurrent cellulitis of her lower extremities -Discussed keeping legs elevated and wearing compression socks when she does not have cellulitis  3. Polyneuropathy associated with underlying disease ([place]) -Patient with polyneuropathy but also with spinal stenosis -She is followed by pain management -Did discuss with patient that she may be able to take a higher dose of gabapentin or switch to Lyrica, but she should discuss this with her pain management physician who is managing these medications -It appears she is also awaiting a CT cervical spine    Meds ordered this encounter  Medications  . cephALEXin (KEFLEX) 500 MG capsule    Sig: Take 1 capsule (500 mg total) by mouth 4 (four) times daily for 7 days.    Dispense:  28 capsule    Refill:  0     Return in about 2 weeks (around 07/07/2018) for Diabetes f/u.   The entirety of the information documented in the History of Present Illness, Review of Systems and Physical Exam were personally obtained by me. Portions of this information were initially documented by Raquel Sarna Ratchford, CMA and reviewed by me for thoroughness and accuracy.    Virginia Crews, MD, MPH [facility] 06/23/2018 3:03 PM

## 2018-06-23 NOTE — Patient Instructions (Addendum)
Talk to Dr Holley Raring about possibility of increasing gabapentin dose or switching to Lyrica for ongoing neuropathy symptoms of arms and legs   Cellulitis, Adult Cellulitis is a skin infection. The infected area is usually red and tender. This condition occurs most often in the arms and lower legs. The infection can travel to the muscles, blood, and underlying tissue and become serious. It is very important to get treated for this condition. What are the causes? Cellulitis is caused by bacteria. The bacteria enter through a break in the skin, such as a cut, burn, insect bite, open sore, or crack. What increases the risk? This condition is more likely to occur in people who:  Have a weak defense system (immune system).  Have open wounds on the skin such as cuts, burns, bites, and scrapes. Bacteria can enter the body through these open wounds.  Are older.  Have diabetes.  Have a type of long-lasting (chronic) liver disease (cirrhosis) or kidney disease.  Use IV drugs.  What are the signs or symptoms? Symptoms of this condition include:  Redness, streaking, or spotting on the skin.  Swollen area of the skin.  Tenderness or pain when an area of the skin is touched.  Warm skin.  Fever.  Chills.  Blisters.  How is this diagnosed? This condition is diagnosed based on a medical history and physical exam. You may also have tests, including:  Blood tests.  Lab tests.  Imaging tests.  How is this treated? Treatment for this condition may include:  Medicines, such as antibiotic medicines or antihistamines.  Supportive care, such as rest and application of cold or warm cloths (cold or warm compresses) to the skin.  Hospital care, if the condition is severe.  The infection usually gets better within 1-2 days of treatment. Follow these instructions at home:  Take over-the-counter and prescription medicines only as told by your health care provider.  If you were prescribed  an antibiotic medicine, take it as told by your health care provider. Do not stop taking the antibiotic even if you start to feel better.  Drink enough fluid to keep your urine clear or pale yellow.  Do not touch or rub the infected area.  Raise (elevate) the infected area above the level of your heart while you are sitting or lying down.  Apply warm or cold compresses to the affected area as told by your health care provider.  Keep all follow-up visits as told by your health care provider. This is important. These visits let your health care provider make sure a more serious infection is not developing. Contact a health care provider if:  You have a fever.  Your symptoms do not improve within 1-2 days of starting treatment.  Your bone or joint underneath the infected area becomes painful after the skin has healed.  Your infection returns in the same area or another area.  You notice a swollen bump in the infected area.  You develop new symptoms.  You have a general ill feeling (malaise) with muscle aches and pains. Get help right away if:  Your symptoms get worse.  You feel very sleepy.  You develop vomiting or diarrhea that persists.  You notice red streaks coming from the infected area.  Your red area gets larger or turns dark in color. This information is not intended to replace advice given to you by your health care provider. Make sure you discuss any questions you have with your health care provider. Document Released: 08/29/2005  Document Revised: 03/29/2016 Document Reviewed: 09/28/2015 Elsevier Interactive Patient Education  [employer].

## 2018-06-28 ENCOUNTER — Other Ambulatory Visit: Payer: Self-pay | Admitting: Family Medicine

## 2018-07-03 ENCOUNTER — Other Ambulatory Visit: Payer: Self-pay | Admitting: Family Medicine

## 2018-07-08 ENCOUNTER — Other Ambulatory Visit: Payer: Self-pay

## 2018-07-08 ENCOUNTER — Ambulatory Visit: Payer: BLUE CROSS/BLUE SHIELD | Attending: Nurse Practitioner | Admitting: Nurse Practitioner

## 2018-07-08 ENCOUNTER — Encounter: Payer: Self-pay | Admitting: Nurse Practitioner

## 2018-07-08 VITALS — BP 178/107 | HR 96 | Temp 98.1°F | Resp 18 | Ht 68.0 in | Wt 328.0 lb

## 2018-07-08 DIAGNOSIS — M47816 Spondylosis without myelopathy or radiculopathy, lumbar region: Secondary | ICD-10-CM | POA: Diagnosis not present

## 2018-07-08 DIAGNOSIS — G8929 Other chronic pain: Secondary | ICD-10-CM

## 2018-07-08 DIAGNOSIS — M5441 Lumbago with sciatica, right side: Secondary | ICD-10-CM

## 2018-07-08 DIAGNOSIS — M79605 Pain in left leg: Secondary | ICD-10-CM

## 2018-07-08 DIAGNOSIS — M79604 Pain in right leg: Secondary | ICD-10-CM

## 2018-07-08 DIAGNOSIS — G894 Chronic pain syndrome: Secondary | ICD-10-CM | POA: Diagnosis not present

## 2018-07-08 DIAGNOSIS — M5442 Lumbago with sciatica, left side: Secondary | ICD-10-CM

## 2018-07-08 DIAGNOSIS — Z79891 Long term (current) use of opiate analgesic: Secondary | ICD-10-CM

## 2018-07-08 MED ORDER — OXYCODONE-ACETAMINOPHEN 10-325 MG PO TABS: 1.0000 | ORAL_TABLET | Freq: Three times a day (TID) | ORAL | 0 refills | Status: AC | PRN

## 2018-07-08 MED ORDER — OXYCODONE-ACETAMINOPHEN 10-325 MG PO TABS: 1.0000 | ORAL_TABLET | Freq: Three times a day (TID) | ORAL | 0 refills | Status: DC | PRN

## 2018-07-08 NOTE — Progress Notes (Signed)
Patient's Name: [name] [name]  MRN: [id number]  Referring Provider: Virginia Crews, MD  DOB: [birth year]  PCP: Virginia Crews, MD  DOS: 07/08/2018  Note by: Vevelyn Francois NP  Service setting: Ambulatory outpatient  Specialty: Interventional Pain Management  Location: [facility] (AMB) Pain Management Facility    Patient type: Established    Primary Reason(s) for Visit: Encounter for prescription drug management. (Level of risk: moderate)  CC: Knee Pain (bilaterally)  HPI  [name] [name] is a 67 y.o. year old, female patient, who comes today for a medication management evaluation. She has Allergic rhinitis; Chronic venous insufficiency; Atherosclerosis of coronary artery; Clinical depression; Diabetes ([place]); Migraines; Venous ulcer of ankle ([place]); Gastroduodenal ulcer; Venous stasis; Rheumatoid arthritis ([place]); Non-ST elevation myocardial infarction (NSTEMI), subendocardial infarction, subsequent episode of care [facility]); Chronic systolic heart failure ([place]); Pure hypercholesterolemia; HTN (hypertension); Lymphedema; Right shoulder pain; Encounter for long-term (current) use of other medications; Atypical endometrial hyperplasia; Chronic pain disorder; Complex endometrial hyperplasia with atypia; Peripheral neuropathy; Urinary incontinence; Benign essential HTN; Chronic systolic CHF (congestive heart failure), NYHA class 3 ([place]); Osteoarthritis; Coronary artery disease; Obesity, unspecified; Peptic ulcer disease; Lumbar spondylosis; Chronic bilateral low back pain with bilateral sciatica; Chronic pain of both lower extremities; Chronic pain syndrome; and Long term current use of opiate analgesic on their problem list. Her primarily concern today is the Knee Pain (bilaterally)  Pain Assessment: Location: Left, Right Knee Duration: Chronic pain Severity: 5 /10 (subjective, self-reported pain score)  Note: Reported level is compatible with observation.                          BP: (!) 178/107  HR: 96    During today's appointment we reviewed [name] [name] chronic pain status, as well as her outpatient medication regimen. She admits that her back pain is worse. She has numbness and tingling with weakness. She admits that she is not interested in any surgery. She admits that her pain is stable but comes and goes. She is not use what makes it worse. She admits that bending makes her pain is worse. SHe tries to avoid lifting and bending. She admits that sweeping, mopping and vacuuming.   The patient  reports that she does not use drugs. Her body mass index is 49.87 kg/m.  Further details on both, my assessment(s), as well as the proposed treatment plan, please see below.  Controlled Substance Pharmacotherapy Assessment REMS (Risk Evaluation and Mitigation Strategy)  Analgesic: Oxycodone 10 mg 3 times daily MME/day: 45 mg/day.  [name], RN  07/08/2018 11:19 AM  Signed Nursing Pain Medication Assessment:  Safety precautions to be maintained throughout the outpatient stay will include: orient to surroundings, keep bed in low position, maintain call bell within reach at all times, provide assistance with transfer out of bed and ambulation.  Medication Inspection Compliance: Pill count conducted under aseptic conditions, in front of the patient. Neither the pills nor the bottle was removed from the patient's sight at any time. Once count was completed pills were immediately returned to the patient in their original bottle.  Medication: Oxycodone/APAP Pill/Patch Count: 13 of 90 pills remain Pill/Patch Appearance: Markings consistent with prescribed medication Bottle Appearance: Standard pharmacy container. Clearly labeled. Filled Date: 7 / 12 / 2019 Last Medication intake:  Today   Pharmacokinetics: Liberation and absorption (onset of action): WNL Distribution (time to peak effect): WNL Metabolism and excretion (duration of action): WNL  Pharmacodynamics: Desired effects: Analgesia:  [name] [name] reports >50% benefit. Functional ability: Patient reports that medication allows her to accomplish basic ADLs Clinically meaningful improvement in function (CMIF): Sustained CMIF goals met Perceived effectiveness: Described as relatively effective, allowing for increase in activities of daily living (ADL) Undesirable effects: Side-effects or Adverse reactions: None reported Monitoring: [place] PMP: Online review of the past 12-monthperiod conducted. Compliant with practice rules and regulations Last UDS on record: Summary  Date Value Ref Range Status  08/13/2017 FINAL  Final    Comment:    ==================================================================== TOXASSURE COMP DRUG ANALYSIS,UR ==================================================================== Test                             Result       Flag       Units Drug Present and Declared for Prescription Verification   Oxycodone                      2498         EXPECTED   ng/mg creat   Oxymorphone                    160          EXPECTED   ng/mg creat   Noroxycodone                   3393         EXPECTED   ng/mg creat    Sources of oxycodone include scheduled prescription medications.    Oxymorphone and noroxycodone are expected metabolites of    oxycodone. Oxymorphone is also available as a scheduled    prescription medication.   Gabapentin                     PRESENT      EXPECTED   Citalopram                     PRESENT      EXPECTED   Desmethylcitalopram            PRESENT      EXPECTED    Desmethylcitalopram is an expected metabolite of citalopram or    the enantiomeric form, escitalopram.   Acetaminophen                  PRESENT      EXPECTED Drug Absent but Declared for Prescription Verification   Tizanidine                     Not Detected UNEXPECTED    Tizanidine, as indicated in the declared medication list, is not    always detected even when used as directed.   Salicylate                     Not Detected  UNEXPECTED    Aspirin, as indicated in the declared medication list, is not    always detected even when used as directed. ==================================================================== Test                      Result    Flag   Units      Ref Range   Creatinine              94               mg/dL      >=  20 ==================================================================== Declared Medications:  The flagging and interpretation on this report are based on the  following declared medications.  Unexpected results may arise from  inaccuracies in the declared medications.  **Note: The testing scope of this panel includes these medications:  Citalopram  Gabapentin  Oxycodone (Oxycodone Acetaminophen)  **Note: The testing scope of this panel does not include small to  moderate amounts of these reported medications:  Acetaminophen (Oxycodone Acetaminophen)  Aspirin (Aspirin 81)  Tizanidine  **Note: The testing scope of this panel does not include following  reported medications:  Carvedilol  Cetirizine  Furosemide  Glipizide  Losartan (Losartan Potassium)  Meloxicam  Prednisone ==================================================================== For clinical consultation, please call [phone]. ====================================================================    UDS interpretation: Compliant          Medication Assessment Form: Reviewed. Patient indicates being compliant with therapy Treatment compliance: Compliant Risk Assessment Profile: Aberrant behavior: See prior evaluations. None observed or detected today Comorbid factors increasing risk of overdose: See prior notes. No additional risks detected today Risk of substance use disorder (SUD): Low Opioid Risk Tool - 07/08/18 1116      Psychological Disease   Psychological Disease  Negative    Depression  Positive      Total Score   Opioid Risk Tool Scoring  1    Opioid Risk Interpretation  Low Risk       ORT Scoring interpretation table:  Score <3 = Low Risk for SUD  Score between 4-7 = Moderate Risk for SUD  Score >8 = High Risk for Opioid Abuse   Risk Mitigation Strategies:  Patient Counseling: Covered Patient-Prescriber Agreement (PPA): Present and active  Notification to other healthcare providers: Done  Pharmacologic Plan: No change in therapy, at this time.             Laboratory Chemistry  Inflammation Markers (CRP: Acute Phase) (ESR: Chronic Phase) Lab Results  Component Value Date   LATICACIDVEN 1.4 09/05/2015                         Rheumatology Markers No results found for: RF, ANA, LABURIC, URICUR, LYMEIGGIGMAB, LYMEABIGMQN, HLAB27                      Renal Function Markers Lab Results  Component Value Date   BUN 18 09/06/2017   CREATININE 0.68 09/06/2017   BCR 16 07/18/2017   GFRAA >60 09/06/2017   GFRNONAA >60 09/06/2017                             Hepatic Function Markers Lab Results  Component Value Date   AST 18 07/18/2017   ALT 13 07/18/2017   ALBUMIN 4.0 07/18/2017   ALKPHOS 75 07/18/2017   LIPASE 20 (L) 09/04/2015                        Electrolytes Lab Results  Component Value Date   NA 138 09/06/2017   K 3.5 09/06/2017   CL 98 (L) 09/06/2017   CALCIUM 7.7 (L) 09/06/2017                        Neuropathy Markers Lab Results  Component Value Date   HGBA1C 9.8 03/19/2018   HIV Non Reactive 07/18/2017  Bone Pathology Markers No results found for: Marveen Reeks, [id number], [id number], 25OHVITD1, 25OHVITD2, 25OHVITD3, TESTOFREE, TESTOSTERONE                       Coagulation Parameters Lab Results  Component Value Date   INR 0.97 08/26/2017   LABPROT 12.8 08/26/2017   APTT 24 08/26/2017   PLT 301 09/04/2017                        Cardiovascular Markers Lab Results  Component Value Date   CKTOTAL 62 11/04/2013   CKMB 1.2 11/04/2013   TROPONINI 1.00 (H) 09/05/2015   HGB 9.6 (L) 09/04/2017   HCT  29.8 (L) 09/04/2017                         CA Markers No results found for: CEA, CA125, LABCA2                      Note: Lab results reviewed.  Recent Diagnostic Imaging Results  DG Knee 1-2 Views Right CLINICAL DATA:  Knee pain  EXAM: RIGHT KNEE - 1-2 VIEW  COMPARISON:  09/02/2017  FINDINGS: No fracture or malalignment. Marked arthritis of the medial compartment of the knee with narrowing, spurring and sclerosis. Mild degenerative changes of the lateral and patellofemoral compartments. No large knee effusion.  IMPRESSION: Marked arthritis involving medial compartment of the right knee. No acute osseous abnormality.  Electronically Signed   By: Donavan Foil M.D.   On: 11/19/2017 15:45 DG Knee 1-2 Views Left CLINICAL DATA:  Bilateral knee pain for many years  EXAM: LEFT KNEE - 1-2 VIEW  COMPARISON:  CT 12/31/2013, radiograph 09/02/2013  FINDINGS: Interval left medial hemiarthroplasty with intact appearing hardware. Slight lateral tilt of the femoral prosthetic. Mild degenerative change of the lateral compartment and patellofemoral compartment. Trace knee effusion. Probable hardware defects in the proximal to midshaft of the tibia. Prominent spurring of the tibial spines.  IMPRESSION: 1. No acute osseous abnormality. 2. Interval medial hemiarthroplasty of the left knee 3. Mild degenerative changes of the lateral and patellofemoral compartments with small knee effusion.  Electronically Signed   By: Donavan Foil M.D.   On: 11/19/2017 15:44 DG HIP UNILAT W OR W/O PELVIS 2-3 VIEWS RIGHT CLINICAL DATA:  Hip pain for many years  EXAM: DG HIP (WITH OR WITHOUT PELVIS) 2-3V RIGHT  COMPARISON:  None.  FINDINGS: No fracture or malalignment. Pubic symphysis and rami are intact. Moderate arthritis of the right hip with joint space narrowing, minimal sclerosis and osteophyte.  IMPRESSION: Moderate arthritis of the right hip.  No acute osseous  abnormality.  Electronically Signed   By: Donavan Foil M.D.   On: 11/19/2017 15:40 DG HIP UNILAT W OR W/O PELVIS 2-3 VIEWS LEFT CLINICAL DATA:  Bilateral hip pain for many years  EXAM: DG HIP (WITH OR WITHOUT PELVIS) 2-3V LEFT  COMPARISON:  CT 09/05/2015  FINDINGS: Mild SI joint degenerative change. Pubic symphysis and rami are intact. Moderate arthritis of the left hip with joint space narrowing. No fracture or malalignment.  IMPRESSION: Moderate arthritis of the left hip. Mild SI joint degenerative changes.  Electronically Signed   By: Donavan Foil M.D.   On: 11/19/2017 15:39 DG Lumbar Spine Complete W/Bend CLINICAL DATA:  Chronic low back pain bilateral hip and knee pain for many years history of rheumatoid arthritis and fibromyalgia  EXAM: LUMBAR SPINE -  COMPLETE WITH BENDING VIEWS  COMPARISON:  CT 09/05/2015  FINDINGS: Five non rib bearing lumbar type vertebra. Vertebral body heights are normal. Mild anterior osteophytes of the lower thoracic spine. Mild degenerative changes at L2-L3. No significant change in alignment with flexion or extension.  IMPRESSION: Mild degenerative changes.  No acute osseous abnormality.  Electronically Signed   By: Donavan Foil M.D.   On: 11/19/2017 15:38  Complexity Note: Imaging results reviewed. Results shared with Ms. Benbrook, using Layman's terms.                         Meds   Current Outpatient Medications:  .  cetirizine (ZYRTEC) 10 MG tablet, TAKE 1 TABLET (10 MG TOTAL) BY MOUTH DAILY. (NOT COVER*), Disp: 90 tablet, Rfl: 3 .  citalopram (CELEXA) 40 MG tablet, Take 40 mg by mouth daily., Disp: , Rfl: 3 .  gabapentin (NEURONTIN) 300 MG capsule, TAKE 3 CAPSULES (900 MG TOTAL) BY MOUTH 3 (THREE) TIMES DAILY., Disp: 270 capsule, Rfl: 11 .  glipiZIDE (GLUCOTROL) 5 MG tablet, TAKE 1 TABLET BY MOUTH TWICE A DAY BEFORE MEALS, Disp: 60 tablet, Rfl: 5 .  glucose blood (ACCU-CHEK AVIVA PLUS) test strip, To check blood sugar  once a day.  DX: E11.9, Disp: 100 each, Rfl: 1 .  mirabegron ER (MYRBETRIQ) 50 MG TB24 tablet, Take 1 tablet (50 mg total) by mouth daily., Disp: 30 tablet, Rfl: 11 .  [START ON 07/13/2018] oxyCODONE-acetaminophen (PERCOCET) 10-325 MG tablet, Take 1 tablet by mouth every 8 (eight) hours as needed for pain. For chronic pain To fill on or after: 07/13/2018, Disp: 90 tablet, Rfl: 0 .  TRULICITY 5.03 UU/8.2CM SOPN, INJECT 0.75 MG INTO THE SKIN ONCE A WEEK., Disp: 4 pen, Rfl: 3 .  [START ON 09/11/2018] oxyCODONE-acetaminophen (PERCOCET) 10-325 MG tablet, Take 1 tablet by mouth every 8 (eight) hours as needed for pain., Disp: 90 tablet, Rfl: 0 .  oxyCODONE-acetaminophen (PERCOCET) 10-325 MG tablet, Take 1 tablet by mouth every 8 (eight) hours as needed for pain., Disp: 90 tablet, Rfl: 0  ROS  Constitutional: Denies any fever or chills Gastrointestinal: No reported hemesis, hematochezia, vomiting, or acute GI distress Musculoskeletal: Denies any acute onset joint swelling, redness, loss of ROM, or weakness Neurological: No reported episodes of acute onset apraxia, aphasia, dysarthria, agnosia, amnesia, paralysis, loss of coordination, or loss of consciousness  Allergies  Ms. Whitehurst is allergic to latex; doxycycline; and sulfa antibiotics.  [place]  Drug: Ms. Mercier  reports that she does not use drugs. Alcohol:  reports that she does not drink alcohol. Tobacco:  reports that she quit smoking about 21 years ago. She has a 29.00 pack-year smoking history. She has never used smokeless tobacco. Medical:  has a past medical history of Arthritis, BRCA negative (07/2017), Cancer ([place]), Cellulitis of both lower extremities, CHF (congestive heart failure) ([place]), Coronary artery disease, Diabetes mellitus without complication ([place]), Family history of breast cancer (07/2017), Family history of ovarian cancer, Fibromyalgia, Fibromyalgia affecting shoulder region, Hyperlipidemia, Hypertension, and Spinal  stenosis. Surgical: Ms. Rogoff  has a past surgical history that includes Medial partial knee replacement (Left, 01/06/2014); Vascular Stent (11/04/2013); Anal fissurectomy (01/2004); Hernia repair; Umbilical hernia repair (N/A, 09/05/2015); Ablation saphenous vein w/ RFA; Coronary angioplasty; Dilation and curettage of uterus; Laparoscopic hysterectomy (N/A, 09/03/2017); and Cystoscopy (09/03/2017). Family: family history includes Aneurysm in her father; Breast cancer (age of onset: 13) in her sister; Breast cancer (age of onset: 38) in her sister;  COPD in her father and sister; Cataracts in her father and mother; Colon cancer in her paternal uncle and paternal uncle; Dementia in her father and mother; Diabetes in her father; Fibromyalgia in her sister; Heart attack in her brother; Hypertension in her mother and sister; Melanoma in her father; Migraines in her brother and sister; Ovarian cancer (age of onset: 67) in her sister; Thyroid disease in her mother; Uterine cancer in her cousin and cousin.  Constitutional Exam  General appearance: Well nourished, well developed, and well hydrated. In no apparent acute distress Vitals:   07/08/18 1109  BP: (!) 178/107  Pulse: 96  Resp: 18  Temp: 98.1 F (36.7 C)  TempSrc: Oral  SpO2: 98%  Weight: (!) 328 lb (148.8 kg)  Height: '5\' 8"'  (1.727 m)  Psych/Mental status: Alert, oriented x 3 (person, place, & time)       Eyes: PERLA Respiratory: No evidence of acute respiratory distress  Lumbar Spine Area Exam  Skin & Axial Inspection: No masses, redness, or swelling Alignment: Symmetrical Functional ROM: Unrestricted ROM       Stability: No instability detected Muscle Tone/Strength: Functionally intact. No obvious neuro-muscular anomalies detected. Sensory (Neurological): Unimpaired Palpation: Complains of area being tender to palpation       Provocative Tests: Hyperextension/rotation test: deferred today       Lumbar quadrant test (Kemp's test):  deferred today       Lateral bending test: deferred today       Patrick's Maneuver: deferred today                    Gait & Posture Assessment  Ambulation: Patient ambulates using a cane Gait: Relatively normal for age and body habitus Posture: WNL   Lower Extremity Exam    Side: Right lower extremity  Side: Left lower extremity  Stability: No instability observed          Stability: No instability observed          Skin & Extremity Inspection: Skin color, temperature, and hair growth are WNL. No peripheral edema or cyanosis. No masses, redness, swelling, asymmetry, or associated skin lesions. No contractures.  Skin & Extremity Inspection: Skin color, temperature, and hair growth are WNL. No peripheral edema or cyanosis. No masses, redness, swelling, asymmetry, or associated skin lesions. No contractures.  Functional ROM: Unrestricted ROM                  Functional ROM: Unrestricted ROM                  Muscle Tone/Strength: Functionally intact. No obvious neuro-muscular anomalies detected.  Muscle Tone/Strength: Functionally intact. No obvious neuro-muscular anomalies detected.  Sensory (Neurological): Unimpaired  Sensory (Neurological): Unimpaired  Palpation: No palpable anomalies  Palpation: No palpable anomalies   Assessment  Primary Diagnosis & Pertinent Problem List: The primary encounter diagnosis was Lumbar spondylosis. Diagnoses of Chronic bilateral low back pain with bilateral sciatica, Chronic pain of both lower extremities, Chronic pain syndrome, and Long term current use of opiate analgesic were also pertinent to this visit.  Status Diagnosis  Persistent Persistent Persistent 1. Lumbar spondylosis   2. Chronic bilateral low back pain with bilateral sciatica   3. Chronic pain of both lower extremities   4. Chronic pain syndrome   5. Long term current use of opiate analgesic     Problems updated and reviewed during this visit: Problem  Lumbar Spondylosis  Chronic  Bilateral Low Back Pain With Bilateral  Sciatica  Chronic Pain of Both Lower Extremities  Chronic Pain Syndrome  Long Term Current Use of Opiate Analgesic  Benign Essential Htn  Chronic Systolic Chf (Congestive Heart Failure), Nyha Class 3 ([facility])   Overview:  Global ef 30%   Non-St Elevation Myocardial Infarction ([place]), Subendocardial Infarction, Subsequent Episode of Care ([facility])  Encounter for Long-Term (Current) Use of Other Medications  Diabetes ([facility])  Migraines  Venous Stasis   Overview:   a.  S/p cellulitis  Overview:   a.  S/p cellulitis   Osteoarthritis   Overview:   a.  Knees.   Coronary Artery Disease   Overview:   a.  Stent   Obesity, Unspecified  Peptic Ulcer Disease   Overview:  a.  S/p transfusion for bleeding ulcer.   Rheumatoid Arthritis ([facility])   Overview:    a.  Seropositive   b.  Methotrexate    C. enbrel  Overview:    a.  Seropositive   b.  Methotrexate    C. enbrel IMO update    Plan of Care  Pharmacotherapy (Medications Ordered): Meds ordered this encounter  Medications  . oxyCODONE-acetaminophen (PERCOCET) 10-325 MG tablet    Sig: Take 1 tablet by mouth every 8 (eight) hours as needed for pain. For chronic pain To fill on or after: 07/13/2018    Dispense:  90 tablet    Refill:  0    Do not place this medication, or any other prescription from our practice, on "Automatic Refill". Patient may have prescription filled one day early if pharmacy is closed on scheduled refill date.    Order Specific Question:   Supervising Provider    Answer:   Milinda Pointer [phone]  . oxyCODONE-acetaminophen (PERCOCET) 10-325 MG tablet    Sig: Take 1 tablet by mouth every 8 (eight) hours as needed for pain.    Dispense:  90 tablet    Refill:  0    Do not place this medication, or any other prescription from our practice, on "Automatic Refill". Patient may have prescription filled one day early if pharmacy is closed on scheduled refill date. Do not  fill until: 09/11/2018 To last until:10/11/2018    Order Specific Question:   Supervising Provider    Answer:   Milinda Pointer [phone]  . oxyCODONE-acetaminophen (PERCOCET) 10-325 MG tablet    Sig: Take 1 tablet by mouth every 8 (eight) hours as needed for pain.    Dispense:  90 tablet    Refill:  0    Do not place this medication, or any other prescription from our practice, on "Automatic Refill". Patient may have prescription filled one day early if pharmacy is closed on scheduled refill date. Do not fill until: 08/12/2018 To last until:09/11/2018    Order Specific Question:   Supervising Provider    Answer:   Milinda Pointer [phone]   New Prescriptions   OXYCODONE-ACETAMINOPHEN (PERCOCET) 10-325 MG TABLET    Take 1 tablet by mouth every 8 (eight) hours as needed for pain.   OXYCODONE-ACETAMINOPHEN (PERCOCET) 10-325 MG TABLET    Take 1 tablet by mouth every 8 (eight) hours as needed for pain.   Medications administered today: [name] "[name]" had no medications administered during this visit. Lab-work, procedure(s), and/or referral(s): Orders Placed This Encounter  Procedures  . ToxASSURE Select 13 (MW), Urine   Imaging and/or referral(s): None  Interventional therapies: Planned, scheduled, and/or pending:   Not at this time.    Provider-requested follow-up: Return in about 3  months (around 10/08/2018) for MedMgmt with Me Donella Stade Edison Pace).  Future Appointments  Date Time Provider [place]  07/09/2018  9:40 AM Brita Romp, Dionne Bucy, MD [facility] None  07/14/2018  1:45 PM Bjorn Loser, MD [facility] None  10/09/2018 11:00 AM Vevelyn Francois, NP [facility] None   Primary Care Physician: Virginia Crews, MD Location: [facility] Outpatient Pain Management Facility Note by: Vevelyn Francois NP Date: 07/08/2018; Time: 4:08 PM  Pain Score Disclaimer: We use the NRS-11 scale. This is a self-reported, subjective measurement of pain severity with only modest accuracy.  It is used primarily to identify changes within a particular patient. It must be understood that outpatient pain scales are significantly less accurate that those used for research, where they can be applied under ideal controlled circumstances with minimal exposure to variables. In reality, the score is likely to be a combination of pain intensity and pain affect, where pain affect describes the degree of emotional arousal or changes in action readiness caused by the sensory experience of pain. Factors such as social and work situation, setting, emotional state, anxiety levels, expectation, and prior pain experience may influence pain perception and show large inter-individual differences that may also be affected by time variables.  Patient instructions provided during this appointment: Patient Instructions   You have been given 3 Rx for Percocet to last until 10/11/2018.  ____________________________________________________________________________________________  Medication Rules  Applies to: All patients receiving prescriptions (written or electronic).  Pharmacy of record: Pharmacy where electronic prescriptions will be sent. If written prescriptions are taken to a different pharmacy, please inform the nursing staff. The pharmacy listed in the electronic medical record should be the one where you would like electronic prescriptions to be sent.  Prescription refills: Only during scheduled appointments. Applies to both, written and electronic prescriptions.  NOTE: The following applies primarily to controlled substances (Opioid* Pain Medications).   Patient's responsibilities: 1. Pain Pills: Bring all pain pills to every appointment (except for procedure appointments). 2. Pill Bottles: Bring pills in original pharmacy bottle. Always bring newest bottle. Bring bottle, even if empty. 3. Medication refills: You are responsible for knowing and keeping track of what medications you need refilled.  The day before your appointment, write a list of all prescriptions that need to be refilled. Bring that list to your appointment and give it to the admitting nurse. Prescriptions will be written only during appointments. If you forget a medication, it will not be "Called in", "Faxed", or "electronically sent". You will need to get another appointment to get these prescribed. 4. Prescription Accuracy: You are responsible for carefully inspecting your prescriptions before leaving our office. Have the discharge nurse carefully go over each prescription with you, before taking them home. Make sure that your name is accurately spelled, that your address is correct. Check the name and dose of your medication to make sure it is accurate. Check the number of pills, and the written instructions to make sure they are clear and accurate. Make sure that you are given enough medication to last until your next medication refill appointment. 5. Taking Medication: Take medication as prescribed. Never take more pills than instructed. Never take medication more frequently than prescribed. Taking less pills or less frequently is permitted and encouraged, when it comes to controlled substances (written prescriptions).  6. Inform other Doctors: Always inform, all of your healthcare providers, of all the medications you take. 7. Pain Medication from other Providers: You are not allowed to accept any additional pain medication from  any other Doctor or Healthcare provider. There are two exceptions to this rule. (see below) In the event that you require additional pain medication, you are responsible for notifying us, as stated below. 8. Medication Agreement: You are responsible for carefully reading and following our Medication Agreement. This must be signed before receiving any prescriptions from our practice. Safely store a copy of your signed Agreement. Violations to the Agreement will result in no further prescriptions.  (Additional copies of our Medication Agreement are available upon request.) 9. Laws, Rules, & Regulations: All patients are expected to follow all Federal and [employer], [employer], Rules, [employer]. Ignorance of the Laws does not constitute a valid excuse. The use of any illegal substances is prohibited. 10. Adopted CDC guidelines & recommendations: Target dosing levels will be at or below 60 MME/day. Use of benzodiazepines** is not recommended.  Exceptions: There are only two exceptions to the rule of not receiving pain medications from other Healthcare Providers. 1. Exception #1 (Emergencies): In the event of an emergency (i.e.: accident requiring emergency care), you are allowed to receive additional pain medication. However, you are responsible for: As soon as you are able, call our office [phone], at any time of the day or night, and leave a message stating your name, the date and nature of the emergency, and the name and dose of the medication prescribed. In the event that your call is answered by a member of our staff, make sure to document and save the date, time, and the name of the person that took your information.  2. Exception #2 (Planned Surgery): In the event that you are scheduled by another doctor or dentist to have any type of surgery or procedure, you are allowed (for a period no longer than 30 days), to receive additional pain medication, for the acute post-op pain. However, in this case, you are responsible for picking up a copy of our "Post-op Pain Management for Surgeons" handout, and giving it to your surgeon or dentist. This document is available at our office, and does not require an appointment to obtain it. Simply go to our office during business hours (Monday-Thursday from 8:00 AM to 4:00 PM) (Friday 8:00 AM to 12:00 Noon) or if you have a scheduled appointment with [name], prior to your surgery, and ask for it by name. In addition, you will need to provide [name] with your  name, name of your surgeon, type of surgery, and date of procedure or surgery.  *Opioid medications include: morphine, codeine, oxycodone, oxymorphone, hydrocodone, hydromorphone, meperidine, tramadol, tapentadol, buprenorphine, fentanyl, methadone. **Benzodiazepine medications include: diazepam (Valium), alprazolam (Xanax), clonazepam (Klonopine), lorazepam (Ativan), clorazepate (Tranxene), chlordiazepoxide (Librium), estazolam (Prosom), oxazepam (Serax), temazepam (Restoril), triazolam (Halcion) (Last updated: 01/30/2018) ____________________________________________________________________________________________    BMI Assessment: Estimated body mass index is 49.87 kg/m as calculated from the following:   Height as of this encounter: '5\' 8"'  (1.727 m).   Weight as of this encounter: 328 lb (148.8 kg).  BMI interpretation table: BMI level Category Range association with higher incidence of chronic pain  <18 kg/m2 Underweight   18.5-24.9 kg/m2 Ideal body weight   25-29.9 kg/m2 Overweight Increased incidence by 20%  30-34.9 kg/m2 Obese (Class I) Increased incidence by 68%  35-39.9 kg/m2 Severe obesity (Class II) Increased incidence by 136%  >40 kg/m2 Extreme obesity (Class [name]) Increased incidence by 254%   Patient's current BMI Ideal Body weight  Body mass index is 49.87 kg/m. Ideal body weight: 63.9 kg (140 lb 14 oz) Adjusted ideal  body weight: 97.9 kg (215 lb 11.6 oz)   BMI Readings from Last 4 Encounters:  07/08/18 49.87 kg/m  06/23/18 50.78 kg/m  06/02/18 51.45 kg/m  04/21/18 48.66 kg/m   Wt Readings from Last 4 Encounters:  07/08/18 (!) 328 lb (148.8 kg)  06/23/18 (!) 334 lb (151.5 kg)  06/02/18 (!) 338 lb 6.4 oz (153.5 kg)  04/21/18 (!) 320 lb (145.2 kg)

## 2018-07-08 NOTE — Progress Notes (Signed)
Nursing Pain Medication Assessment:  Safety precautions to be maintained throughout the outpatient stay will include: orient to surroundings, keep bed in low position, maintain call bell within reach at all times, provide assistance with transfer out of bed and ambulation.  Medication Inspection Compliance: Pill count conducted under aseptic conditions, in front of the patient. Neither the pills nor the bottle was removed from the patient's sight at any time. Once count was completed pills were immediately returned to the patient in their original bottle.  Medication: Oxycodone/APAP Pill/Patch Count: 13 of 90 pills remain Pill/Patch Appearance: Markings consistent with prescribed medication Bottle Appearance: Standard pharmacy container. Clearly labeled. Filled Date: 7 / 12 / 2019 Last Medication intake:  Today

## 2018-07-08 NOTE — Patient Instructions (Addendum)
You have been given 3 Rx for Percocet to last until 10/11/2018.  ____________________________________________________________________________________________  Medication Rules  Applies to: All patients receiving prescriptions (written or electronic).  Pharmacy of record: Pharmacy where electronic prescriptions will be sent. If written prescriptions are taken to a different pharmacy, please inform the nursing staff. The pharmacy listed in the electronic medical record should be the one where you would like electronic prescriptions to be sent.  Prescription refills: Only during scheduled appointments. Applies to both, written and electronic prescriptions.  NOTE: The following applies primarily to controlled substances (Opioid* Pain Medications).   Patient's responsibilities: 1. Pain Pills: Bring all pain pills to every appointment (except for procedure appointments). 2. Pill Bottles: Bring pills in original pharmacy bottle. Always bring newest bottle. Bring bottle, even if empty. 3. Medication refills: You are responsible for knowing and keeping track of what medications you need refilled. The day before your appointment, write a list of all prescriptions that need to be refilled. Bring that list to your appointment and give it to the admitting nurse. Prescriptions will be written only during appointments. If you forget a medication, it will not be "Called in", "Faxed", or "electronically sent". You will need to get another appointment to get these prescribed. 4. Prescription Accuracy: You are responsible for carefully inspecting your prescriptions before leaving our office. Have the discharge nurse carefully go over each prescription with you, before taking them home. Make sure that your name is accurately spelled, that your address is correct. Check the name and dose of your medication to make sure it is accurate. Check the number of pills, and the written instructions to make sure they are clear  and accurate. Make sure that you are given enough medication to last until your next medication refill appointment. 5. Taking Medication: Take medication as prescribed. Never take more pills than instructed. Never take medication more frequently than prescribed. Taking less pills or less frequently is permitted and encouraged, when it comes to controlled substances (written prescriptions).  6. Inform other Doctors: Always inform, all of your healthcare providers, of all the medications you take. 7. Pain Medication from other Providers: You are not allowed to accept any additional pain medication from any other Doctor or Healthcare provider. There are two exceptions to this rule. (see below) In the event that you require additional pain medication, you are responsible for notifying us, as stated below. 8. Medication Agreement: You are responsible for carefully reading and following our Medication Agreement. This must be signed before receiving any prescriptions from our practice. Safely store a copy of your signed Agreement. Violations to the Agreement will result in no further prescriptions. (Additional copies of our Medication Agreement are available upon request.) 9. Laws, Rules, & Regulations: All patients are expected to follow all Federal and [employer], [employer], Rules, [employer]. Ignorance of the Laws does not constitute a valid excuse. The use of any illegal substances is prohibited. 10. Adopted CDC guidelines & recommendations: Target dosing levels will be at or below 60 MME/day. Use of benzodiazepines** is not recommended.  Exceptions: There are only two exceptions to the rule of not receiving pain medications from other Healthcare Providers. 1. Exception #1 (Emergencies): In the event of an emergency (i.e.: accident requiring emergency care), you are allowed to receive additional pain medication. However, you are responsible for: As soon as you are able, call our office [phone], at any  time of the day or night, and leave a message stating your name, the date  and nature of the emergency, and the name and dose of the medication prescribed. In the event that your call is answered by a member of our staff, make sure to document and save the date, time, and the name of the person that took your information.  2. Exception #2 (Planned Surgery): In the event that you are scheduled by another doctor or dentist to have any type of surgery or procedure, you are allowed (for a period no longer than 30 days), to receive additional pain medication, for the acute post-op pain. However, in this case, you are responsible for picking up a copy of our "Post-op Pain Management for Surgeons" handout, and giving it to your surgeon or dentist. This document is available at our office, and does not require an appointment to obtain it. Simply go to our office during business hours (Monday-Thursday from 8:00 AM to 4:00 PM) (Friday 8:00 AM to 12:00 Noon) or if you have a scheduled appointment with [name], prior to your surgery, and ask for it by name. In addition, you will need to provide [name] with your name, name of your surgeon, type of surgery, and date of procedure or surgery.  *Opioid medications include: morphine, codeine, oxycodone, oxymorphone, hydrocodone, hydromorphone, meperidine, tramadol, tapentadol, buprenorphine, fentanyl, methadone. **Benzodiazepine medications include: diazepam (Valium), alprazolam (Xanax), clonazepam (Klonopine), lorazepam (Ativan), clorazepate (Tranxene), chlordiazepoxide (Librium), estazolam (Prosom), oxazepam (Serax), temazepam (Restoril), triazolam (Halcion) (Last updated: 01/30/2018) ____________________________________________________________________________________________    BMI Assessment: Estimated body mass index is 49.87 kg/m as calculated from the following:   Height as of this encounter: 5\' 8"  (1.727 m).   Weight as of this encounter: 328 lb (148.8 kg).  BMI  interpretation table: BMI level Category Range association with higher incidence of chronic pain  <18 kg/m2 Underweight   18.5-24.9 kg/m2 Ideal body weight   25-29.9 kg/m2 Overweight Increased incidence by 20%  30-34.9 kg/m2 Obese (Class I) Increased incidence by 68%  35-39.9 kg/m2 Severe obesity (Class II) Increased incidence by 136%  >40 kg/m2 Extreme obesity (Class [name]) Increased incidence by 254%   Patient's current BMI Ideal Body weight  Body mass index is 49.87 kg/m. Ideal body weight: 63.9 kg (140 lb 14 oz) Adjusted ideal body weight: 97.9 kg (215 lb 11.6 oz)   BMI Readings from Last 4 Encounters:  07/08/18 49.87 kg/m  06/23/18 50.78 kg/m  06/02/18 51.45 kg/m  04/21/18 48.66 kg/m   Wt Readings from Last 4 Encounters:  07/08/18 (!) 328 lb (148.8 kg)  06/23/18 (!) 334 lb (151.5 kg)  06/02/18 (!) 338 lb 6.4 oz (153.5 kg)  04/21/18 (!) 320 lb (145.2 kg)

## 2018-07-09 ENCOUNTER — Encounter: Payer: Self-pay | Admitting: Family Medicine

## 2018-07-09 ENCOUNTER — Ambulatory Visit: Payer: BLUE CROSS/BLUE SHIELD | Admitting: Family Medicine

## 2018-07-09 VITALS — BP 152/100 | HR 92 | Temp 97.8°F | Resp 20 | Wt 331.0 lb

## 2018-07-09 DIAGNOSIS — E1142 Type 2 diabetes mellitus with diabetic polyneuropathy: Secondary | ICD-10-CM | POA: Diagnosis not present

## 2018-07-09 DIAGNOSIS — I83003 Varicose veins of unspecified lower extremity with ulcer of ankle: Secondary | ICD-10-CM

## 2018-07-09 DIAGNOSIS — R5382 Chronic fatigue, unspecified: Secondary | ICD-10-CM | POA: Diagnosis not present

## 2018-07-09 DIAGNOSIS — E785 Hyperlipidemia, unspecified: Secondary | ICD-10-CM

## 2018-07-09 DIAGNOSIS — I10 Essential (primary) hypertension: Secondary | ICD-10-CM | POA: Diagnosis not present

## 2018-07-09 DIAGNOSIS — R531 Weakness: Secondary | ICD-10-CM | POA: Diagnosis not present

## 2018-07-09 DIAGNOSIS — L97302 Non-pressure chronic ulcer of unspecified ankle with fat layer exposed: Secondary | ICD-10-CM

## 2018-07-09 DIAGNOSIS — I2510 Atherosclerotic heart disease of native coronary artery without angina pectoris: Secondary | ICD-10-CM

## 2018-07-09 DIAGNOSIS — I214 Non-ST elevation (NSTEMI) myocardial infarction: Secondary | ICD-10-CM

## 2018-07-09 DIAGNOSIS — I5022 Chronic systolic (congestive) heart failure: Secondary | ICD-10-CM

## 2018-07-09 DIAGNOSIS — I872 Venous insufficiency (chronic) (peripheral): Secondary | ICD-10-CM

## 2018-07-09 LAB — POCT GLYCOSYLATED HEMOGLOBIN (HGB A1C)
Est. average glucose Bld gHb Est-mCnc: 243
Hemoglobin A1C: 10.1 % — AB (ref 4.0–5.6)

## 2018-07-09 MED ORDER — DULAGLUTIDE 1.5 MG/0.5ML ~~LOC~~ SOAJ: 1.5000 mg | SUBCUTANEOUS | 5 refills | Status: DC

## 2018-07-09 NOTE — Assessment & Plan Note (Signed)
Poorly controlled A1c has been continually increasing from 7.4, 8.5, 9.8, 10.1 today Continue glipizide at current dose Given the patient's age and comorbidities, we will hold off on increasing her glipizide further due to fall risk and hypoglycemia risk Due to patient's significant urinary incontinence issues, will hold off on SGLT2 at this time She is tolerating her Trulicity well, so we will increase to 1.5 mg weekly Advised her to call her ophthalmologist to schedule diabetic eye exam We will follow-up in 6 weeks and consider addition of an another medication

## 2018-07-09 NOTE — Assessment & Plan Note (Signed)
Currently asymptomatic without angina Will refer to cardiology for second opinion as above Patient is not taking any antiplatelet therapy at this time

## 2018-07-09 NOTE — Progress Notes (Signed)
Patient: [name] [name] Female    DOB: 07-12-1951   67 y.o.   MRN: [id number] Visit Date: 07/09/2018  Today's Provider: Lavon Paganini, MD   I, Martha Clan, CMA, am acting as scribe for Lavon Paganini, MD.  No chief complaint on file.  Subjective:    HPI      Diabetes Mellitus Type II, Follow-up:   Lab Results  Component Value Date   HGBA1C 9.8 03/19/2018   HGBA1C 8.4 (H) 07/18/2017   HGBA1C 7.4 (H) 12/13/2016    Last seen for diabetes on 03/19/2018 Management since then includes starting Trulicity. She reports good compliance with treatment. She is not having side effects.  Current symptoms include nausea, paresthesia of the feet, polydipsia, polyuria and visual disturbances and have been worsening. Home blood sugar records: over 100 per pt. States she can not remember the readings.  Episodes of hypoglycemia? no   Most Recent Eye Exam: is due Weight trend: fluctuating a bit Current diet: eats at her convienence. Her husband is ill, and she is busy taking care of him Current exercise: none  Pertinent Labs:    Component Value Date/Time   CHOL 272 (H) 12/13/2016 1056   TRIG 254.0 (H) 12/13/2016 1056   HDL 44.80 12/13/2016 1056   CREATININE 0.68 09/06/2017 0350   CREATININE 0.63 08/31/2014 0533    Wt Readings from Last 3 Encounters:  07/08/18 (!) 328 lb (148.8 kg)  06/23/18 (!) 334 lb (151.5 kg)  06/02/18 (!) 338 lb 6.4 oz (153.5 kg)   ------------------------------------------------------------------------ Pt also has other complaints, including early satiety with abdominal bloating and nausea, dizziness, weakness, fatigue, easy bruising, and "severe itching". Pt states her husband's illness is catching up with her.  States that all of these symptoms have been present for a long time but she has put them on the back burner while her husband was sick.  She is worried that her cellulitis is not improved, and she has noticed intermittent fevers up  to 100 degrees. She finished her abx. Continues to have redness, open wounds, and drainage of clear fluid.  No pain or purulent drainage.  Allergies  Allergen Reactions  . Latex Itching    itching  . Doxycycline Rash  . Sulfa Antibiotics Itching, Rash and Swelling     Current Outpatient Medications:  .  cetirizine (ZYRTEC) 10 MG tablet, TAKE 1 TABLET (10 MG TOTAL) BY MOUTH DAILY. (NOT COVER*), Disp: 90 tablet, Rfl: 3 .  citalopram (CELEXA) 40 MG tablet, Take 40 mg by mouth daily., Disp: , Rfl: 3 .  gabapentin (NEURONTIN) 300 MG capsule, TAKE 3 CAPSULES (900 MG TOTAL) BY MOUTH 3 (THREE) TIMES DAILY., Disp: 270 capsule, Rfl: 11 .  glipiZIDE (GLUCOTROL) 5 MG tablet, TAKE 1 TABLET BY MOUTH TWICE A DAY BEFORE MEALS, Disp: 60 tablet, Rfl: 5 .  glucose blood (ACCU-CHEK AVIVA PLUS) test strip, To check blood sugar once a day.  DX: E11.9, Disp: 100 each, Rfl: 1 .  mirabegron ER (MYRBETRIQ) 50 MG TB24 tablet, Take 1 tablet (50 mg total) by mouth daily., Disp: 30 tablet, Rfl: 11 .  [START ON 07/13/2018] oxyCODONE-acetaminophen (PERCOCET) 10-325 MG tablet, Take 1 tablet by mouth every 8 (eight) hours as needed for pain. For chronic pain To fill on or after: 07/13/2018, Disp: 90 tablet, Rfl: 0 .  [START ON 09/11/2018] oxyCODONE-acetaminophen (PERCOCET) 10-325 MG tablet, Take 1 tablet by mouth every 8 (eight) hours as needed for pain., Disp: 90 tablet, Rfl: 0 .  oxyCODONE-acetaminophen (PERCOCET) 10-325 MG tablet, Take 1 tablet by mouth every 8 (eight) hours as needed for pain., Disp: 90 tablet, Rfl: 0 .  TRULICITY 1.61 WR/6.0AV SOPN, INJECT 0.75 MG INTO THE SKIN ONCE A WEEK., Disp: 4 pen, Rfl: 3  Review of Systems  Constitutional: Positive for appetite change, diaphoresis, fatigue and fever (intermittent up to 100 degrees). Negative for activity change, chills and unexpected weight change.  Eyes: Positive for visual disturbance (blurred vision sometimes).  Respiratory: Positive for chest tightness and  shortness of breath.   Cardiovascular: Positive for palpitations (with exertion) and leg swelling. Negative for chest pain.  Gastrointestinal: Positive for abdominal distention (and feeling full early) and nausea. Negative for vomiting.  Endocrine: Positive for polydipsia and polyuria. Negative for polyphagia.  Skin: Positive for wound (cellulitis not improved).       Severe itching  Neurological: Positive for dizziness and weakness.  Hematological: Bruises/bleeds easily.    Social History   Tobacco Use  . Smoking status: Former Smoker    Packs/day: 1.00    Years: 29.00    Pack years: 29.00    Last attempt to quit: 12/03/1996    Years since quitting: 21.6  . Smokeless tobacco: Never Used  Substance Use Topics  . Alcohol use: No   Objective:   LMP  (LMP Unknown) Comment: age 39 There were no vitals filed for this visit.   Physical Exam  Constitutional: She is oriented to person, place, and time. She appears well-developed and well-nourished. No distress.  HENT:  Head: Normocephalic and atraumatic.  Eyes: Conjunctivae are normal. No scleral icterus.  Neck: Neck supple. No thyromegaly present.  Cardiovascular: Normal rate, regular rhythm, normal heart sounds and intact distal pulses.  No murmur heard. Pulmonary/Chest: Effort normal and breath sounds normal. No respiratory distress. She has no wheezes. She has no rales.  Abdominal: Soft. She exhibits no distension. There is no tenderness. There is no guarding.  Musculoskeletal: She exhibits no edema.  Lymphadenopathy:    She has no cervical adenopathy.  Neurological: She is alert and oriented to person, place, and time. A sensory deficit (in b/l feet) is present.  Skin: Skin is warm. There is erythema.  See pictures. Multiple venous ulcers of b/l legs (L>R). Intact DP and PT pulses.  No pallor or temp change.  Venous stasis dermatitis b/l.  No TTP. No purulent drainage.  Psychiatric: She has a normal mood and affect. Her  behavior is normal.  Vitals reviewed.            Assessment & Plan:   Problem List Items Addressed This Visit      Cardiovascular and Mediastinum   Chronic venous insufficiency    Ongoing issue with minimal swelling currently but with venous ulcers Was previously seen by [facility] Will refer back to [facility] for further management      Relevant Orders   Ambulatory referral to Vascular Surgery   Non-ST elevation myocardial infarction (NSTEMI), subendocardial infarction, subsequent episode of care [facility])    History of Patient will not return to [facility] and wishes to have a second opinion She is not on any medications for this and does not want to try anything new right now She will consider based on Cardiology recs Currently asymptomatic, except for vague fatigue and weakness      Relevant Orders   Ambulatory referral to Cardiology   Chronic systolic heart failure ([place])    Stable Euvolemic today As above, patient wishes to have a second  opinion and see a different cardiologist She is not currently on Lasix, losartan, or Coreg which she was previously on She does not want to restart these medications at this time, but agrees to consider them pending cardiology recommendation      Relevant Orders   Ambulatory referral to Cardiology   HTN (hypertension)    Remains elevated As above, patient is not taking her Coreg, losartan, Lasix She will consider these again after following up with cardiology      Relevant Orders   Comprehensive metabolic panel   Coronary artery disease    Currently asymptomatic without angina Will refer to cardiology for second opinion as above Patient is not taking any antiplatelet therapy at this time      Relevant Orders   Ambulatory referral to Cardiology     Endocrine   Diabetes ([place]) - Primary    Poorly controlled A1c has been continually increasing from 7.4, 8.5, 9.8, 10.1 today Continue glipizide at current dose Given the patient's  age and comorbidities, we will hold off on increasing her glipizide further due to fall risk and hypoglycemia risk Due to patient's significant urinary incontinence issues, will hold off on SGLT2 at this time She is tolerating her Trulicity well, so we will increase to 1.5 mg weekly Advised her to call her ophthalmologist to schedule diabetic eye exam We will follow-up in 6 weeks and consider addition of an another medication      Relevant Medications   Dulaglutide (TRULICITY) 1.5 MO/2.9UT SOPN   Other Relevant Orders   Lipid panel     Musculoskeletal and Integument   Venous ulcer of ankle ([place])    Does not appear to have a current infection as recent treatment for cellulitis seem to improve the purulent drainage She does have multiple venous ulcers and chronic venous stasis Referral to vascular surgery      Relevant Orders   Ambulatory referral to Vascular Surgery     Other   General weakness    Chronic and nonspecific symptoms Could be related to any of her chronic medical problems Could be related to her untreated CHF and history of ACS and CAD Could be related to her depression and anxiety We will check labs including CMP, CBC, TSH, vitamin D, vitamin B12 to see if any of this is contributing We will also work on improving diabetes control as this could be contributing We will follow-up in 6 weeks and continue work-up Patient declines GI referral at this time, but she does need colon cancer screening and possible other work-up for early satiety, but we will discuss again at next visit      Relevant Orders   CBC   VITAMIN D 25 Hydroxy (Vit-D Deficiency, Fractures)   B12   TSH    Other Visit Diagnoses    Chronic fatigue       Relevant Orders   CBC   VITAMIN D 25 Hydroxy (Vit-D Deficiency, Fractures)   B12   TSH   Hyperlipidemia, unspecified hyperlipidemia type       Relevant Orders   Lipid panel   Comprehensive metabolic panel       Return in about 6 weeks  (around 08/20/2018) for chronic disease f/u.   The entirety of the information documented in the History of Present Illness, Review of Systems and Physical Exam were personally obtained by me. Portions of this information were initially documented by Raquel Sarna Ratchford, CMA and reviewed by me for thoroughness and accuracy.  Virginia Crews, MD, MPH [facility] 07/09/2018 10:47 AM

## 2018-07-09 NOTE — Assessment & Plan Note (Signed)
Ongoing issue with minimal swelling currently but with venous ulcers Was previously seen by [facility] Will refer back to [facility] for further management

## 2018-07-09 NOTE — Assessment & Plan Note (Signed)
Remains elevated As above, patient is not taking her Coreg, losartan, Lasix She will consider these again after following up with cardiology

## 2018-07-09 NOTE — Assessment & Plan Note (Signed)
History of Patient will not return to [facility] and wishes to have a second opinion She is not on any medications for this and does not want to try anything new right now She will consider based on Cardiology recs Currently asymptomatic, except for vague fatigue and weakness

## 2018-07-09 NOTE — Addendum Note (Signed)
Addended by: Brennan Bailey on: 07/09/2018 11:11 AM   Modules accepted: Orders

## 2018-07-09 NOTE — Assessment & Plan Note (Signed)
Stable Euvolemic today As above, patient wishes to have a second opinion and see a different cardiologist She is not currently on Lasix, losartan, or Coreg which she was previously on She does not want to restart these medications at this time, but agrees to consider them pending cardiology recommendation

## 2018-07-09 NOTE — Patient Instructions (Signed)

## 2018-07-09 NOTE — Assessment & Plan Note (Signed)
Chronic and nonspecific symptoms Could be related to any of her chronic medical problems Could be related to her untreated CHF and history of ACS and CAD Could be related to her depression and anxiety We will check labs including CMP, CBC, TSH, vitamin D, vitamin B12 to see if any of this is contributing We will also work on improving diabetes control as this could be contributing We will follow-up in 6 weeks and continue work-up Patient declines GI referral at this time, but she does need colon cancer screening and possible other work-up for early satiety, but we will discuss again at next visit

## 2018-07-09 NOTE — Assessment & Plan Note (Signed)
Does not appear to have a current infection as recent treatment for cellulitis seem to improve the purulent drainage She does have multiple venous ulcers and chronic venous stasis Referral to vascular surgery

## 2018-07-10 ENCOUNTER — Encounter: Payer: BLUE CROSS/BLUE SHIELD | Admitting: Nurse Practitioner

## 2018-07-12 LAB — TOXASSURE SELECT 13 (MW), URINE

## 2018-07-14 ENCOUNTER — Ambulatory Visit: Payer: BLUE CROSS/BLUE SHIELD | Admitting: Urology

## 2018-07-19 LAB — LIPID PANEL
Chol/HDL Ratio: 7.3 ratio — ABNORMAL HIGH (ref 0.0–4.4)
Cholesterol, Total: 299 mg/dL — ABNORMAL HIGH (ref 100–199)
HDL: 41 mg/dL (ref >39)
LDL Calculated: 214 mg/dL — ABNORMAL HIGH (ref 0–99)
Triglycerides: 222 mg/dL — ABNORMAL HIGH (ref 0–149)
VLDL Cholesterol Cal: 44 mg/dL — ABNORMAL HIGH (ref 5–40)

## 2018-07-19 LAB — CBC
Hematocrit: 41.6 % (ref 34.0–46.6)
Hemoglobin: 13.4 g/dL (ref 11.1–15.9)
MCH: 26.4 pg — ABNORMAL LOW (ref 26.6–33.0)
MCHC: 32.2 g/dL (ref 31.5–35.7)
MCV: 82 fL (ref 79–97)
Platelets: 256 10*3/uL (ref 150–450)
RBC: 5.07 x10E6/uL (ref 3.77–5.28)
RDW: 15.4 % (ref 12.3–15.4)
WBC: 4.9 10*3/uL (ref 3.4–10.8)

## 2018-07-19 LAB — COMPREHENSIVE METABOLIC PANEL
ALT: 8 IU/L (ref 0–32)
AST: 14 IU/L (ref 0–40)
Albumin/Globulin Ratio: 1 — ABNORMAL LOW (ref 1.2–2.2)
Albumin: 3.8 g/dL (ref 3.6–4.8)
Alkaline Phosphatase: 77 IU/L (ref 39–117)
BUN/Creatinine Ratio: 18 (ref 12–28)
BUN: 11 mg/dL (ref 8–27)
Bilirubin Total: 0.4 mg/dL (ref 0.0–1.2)
CO2: 25 mmol/L (ref 20–29)
Calcium: 9.3 mg/dL (ref 8.7–10.3)
Chloride: 96 mmol/L (ref 96–106)
Creatinine, Ser: 0.62 mg/dL (ref 0.57–1.00)
GFR calc Af Amer: 110 mL/min/{1.73_m2} (ref >59)
GFR calc non Af Amer: 96 mL/min/{1.73_m2} (ref >59)
Globulin, Total: 3.7 g/dL (ref 1.5–4.5)
Glucose: 186 mg/dL — ABNORMAL HIGH (ref 65–99)
Potassium: 4.9 mmol/L (ref 3.5–5.2)
Sodium: 136 mmol/L (ref 134–144)
Total Protein: 7.5 g/dL (ref 6.0–8.5)

## 2018-07-19 LAB — TSH: TSH: 2.16 u[IU]/mL (ref 0.450–4.500)

## 2018-07-19 LAB — VITAMIN D 25 HYDROXY (VIT D DEFICIENCY, FRACTURES): Vit D, 25-Hydroxy: 16.5 ng/mL — ABNORMAL LOW (ref 30.0–100.0)

## 2018-07-19 LAB — VITAMIN B12: Vitamin B-12: 326 pg/mL (ref 232–1245)

## 2018-07-21 ENCOUNTER — Ambulatory Visit (INDEPENDENT_AMBULATORY_CARE_PROVIDER_SITE_OTHER): Payer: BLUE CROSS/BLUE SHIELD | Admitting: Vascular Surgery

## 2018-07-23 ENCOUNTER — Encounter: Payer: Self-pay | Admitting: Nurse Practitioner

## 2018-07-23 DIAGNOSIS — F1290 Cannabis use, unspecified, uncomplicated: Secondary | ICD-10-CM | POA: Insufficient documentation

## 2018-07-24 ENCOUNTER — Telehealth: Payer: Self-pay | Admitting: Family Medicine

## 2018-07-24 DIAGNOSIS — E559 Vitamin D deficiency, unspecified: Secondary | ICD-10-CM

## 2018-07-24 MED ORDER — VITAMIN D (ERGOCALCIFEROL) 1.25 MG (50000 UNIT) PO CAPS: 50000.0000 [IU] | ORAL_CAPSULE | ORAL | 0 refills | Status: DC

## 2018-07-24 NOTE — Telephone Encounter (Signed)
-----   Message from Virginia Crews, MD sent at 07/23/2018 11:13 AM EDT ----- Cholesterol is quite elevated.  Risk of heart attack and stroke is significantly increased with this elevated cholesterol and diabetes.  I do recommend starting a statin to lower cholesterol and therefore lower stroke and heart attack risk.  If patient agrees, okay to send prescription for atorvastatin 40 mg daily #30, 5 refills.  Would like to recheck her lipid panel and CMP when fasting in about 3 months after starting the medication.  I believe that she has a follow-up visit around that time, and if not she should.  Blood sugar is elevated.  Normal kidney function, liver function, electrolytes, blood counts, B12 level, thyroid function.  Vitamin D level is significantly low.  Recommend high-dose vitamin D weekly supplementation for 12 weeks.  We will also recheck this in about 3 months and see if we can go to a lower dose supplement.  If patient agrees, okay to send prescription for ergocalciferol 50,000 units, take 1 pill weekly, #12, 0 refills.  Virginia Crews, MD, MPH [facility] 07/23/2018 11:13 AM

## 2018-07-24 NOTE — Telephone Encounter (Signed)
Noted.  I have had the discussion about risks of elevated cholesterol and importance of statin several times with this patient.  All I can do is recommend this.  Virginia Crews, MD, MPH [facility] 07/24/2018 4:49 PM

## 2018-07-24 NOTE — Telephone Encounter (Signed)
Pt states she was in last week for labs and has not heard anything back.  CB#  [phone]  Thanks [name]

## 2018-07-24 NOTE — Telephone Encounter (Signed)
Pt advised. Refused statin "for a whole myriad of reasons", and will not go into detail. Encouraged pt to get 150 minutes of exercise weekly, which she states she is unable to do because she is "severely disabled". She also states her diet is a low cholesterol diet. She does agree to start vitamin D, which I will send to the pharmacy.

## 2018-07-28 ENCOUNTER — Encounter: Payer: Self-pay | Admitting: Urology

## 2018-07-28 ENCOUNTER — Ambulatory Visit: Payer: BLUE CROSS/BLUE SHIELD | Admitting: Urology

## 2018-08-01 ENCOUNTER — Other Ambulatory Visit: Payer: Self-pay | Admitting: Family Medicine

## 2018-08-05 ENCOUNTER — Other Ambulatory Visit: Payer: Self-pay | Admitting: Family Medicine

## 2018-08-05 DIAGNOSIS — F32A Depression, unspecified: Secondary | ICD-10-CM

## 2018-08-05 DIAGNOSIS — F329 Major depressive disorder, single episode, unspecified: Secondary | ICD-10-CM

## 2018-08-10 NOTE — Progress Notes (Signed)
MRN : [id number]  [name] [name] is a 67 y.o. ([birth year]) female who presents with chief complaint of No chief complaint on file. [name]  History of Present Illness:   Patient is seen for evaluation of leg pain and swelling associated with new onset ulcerations of both ankle areas. The patient first noticed the swelling remotely. The swelling is associated with pain and discoloration. The pain and swelling worsens with prolonged dependency and improves with elevation. The pain is unrelated to activity.  The patient notes that in the morning the legs are better but the leg symptoms worsened throughout the course of the day. The patient has also noted a progressive worsening of the discoloration in the ankle and shin area.   The patient notes that an ulcer has developed acutely without specific trauma and since it occurred it has been very slow to heal.  There is a moderate amount of drainage associated with the open area.  The wound is also very painful.  The patient denies claudication symptoms or rest pain symptoms.  The patient denies DJD and LS spine disease.  The patient has not had any past angiography, interventions or vascular surgery.  Elevation makes the leg symptoms better, dependency makes them much worse. The patient denies any recent changes in medications.  The patient has been wearing graduated compression.  The patient denies a history of DVT or PE. There is no prior history of phlebitis. There is no history of primary lymphedema.  No history of malignancies. No history of trauma or groin or pelvic surgery. There is no history of radiation treatment to the groin or pelvis   No outpatient medications have been marked as taking for the 08/11/18 encounter (Appointment) with Delana Meyer, Dolores Lory, MD.    Past Medical History:  Diagnosis Date  . Arthritis    Rhumetoid arthritis  . BRCA negative 07/2017   MyRisk neg  . Cancer ([place])    uterine  . Cellulitis of both lower  extremities    Chronic  . CHF (congestive heart failure) ([place])   . Coronary artery disease   . Diabetes mellitus without complication ([place])   . Family history of breast cancer 07/2017   MyRisk neg; IBIS=10.5%/riskscore=17%  . Family history of ovarian cancer   . Fibromyalgia   . Fibromyalgia affecting shoulder region   . Hyperlipidemia   . Hypertension   . Spinal stenosis     Past Surgical History:  Procedure Laterality Date  . ABLATION SAPHENOUS VEIN W/ RFA    . ANAL FISSURECTOMY  01/2004  . CORONARY ANGIOPLASTY    . CYSTOSCOPY  09/03/2017   Procedure: CYSTOSCOPY;  Surgeon: Gae Dry, MD;  Location: [facility] ORS;  Service: Gynecology;;  . DILATION AND CURETTAGE OF UTERUS    . HERNIA REPAIR    . LAPAROSCOPIC HYSTERECTOMY N/A 09/03/2017   Procedure: HYSTERECTOMY TOTAL LAPAROSCOPIC BSO;  Surgeon: Gae Dry, MD;  Location: [facility] ORS;  Service: Gynecology;  Laterality: N/A;  . MEDIAL PARTIAL KNEE REPLACEMENT Left 01/06/2014  . UMBILICAL HERNIA REPAIR N/A 09/05/2015   Procedure: HERNIA REPAIR incarcerated UMBILICAL ADULT;  Surgeon: Sherri Rad, MD;  Location: [facility] ORS;  Service: General;  Laterality: N/A;  . Vascular Stent  11/04/2013    Social History Social History   Tobacco Use  . Smoking status: Former Smoker    Packs/day: 1.00    Years: 29.00    Pack years: 29.00    Last attempt to quit: 12/03/1996    Years  since quitting: 21.6  . Smokeless tobacco: Never Used  Substance Use Topics  . Alcohol use: No  . Drug use: No    Family History Family History  Problem Relation Age of Onset  . Hypertension Mother   . Cataracts Mother   . Thyroid disease Mother   . Dementia Mother   . COPD Father   . Dementia Father   . Cataracts Father   . Aneurysm Father        Abdominal & Brain  . Diabetes Father   . Melanoma Father   . Breast cancer Sister 71  . Fibromyalgia Sister   . Migraines Sister   . Hypertension Sister   . Breast cancer Sister 66  . COPD Sister   .  Ovarian cancer Sister 20  . Migraines Brother   . Heart attack Brother   . Colon cancer Paternal Uncle        79s  . Colon cancer Paternal Uncle        52s  . Uterine cancer Cousin   . Uterine cancer Cousin     Allergies  Allergen Reactions  . Latex Itching    itching  . Doxycycline Rash  . Sulfa Antibiotics Itching, Rash and Swelling     REVIEW OF SYSTEMS (Negative unless checked)  Constitutional: '[]' Weight loss  '[]' Fever  '[]' Chills Cardiac: '[]' Chest pain   '[]' Chest pressure   '[]' Palpitations   '[]' Shortness of breath when laying flat   '[]' Shortness of breath with exertion. Vascular:  '[]' Pain in legs with walking   '[]' Pain in legs at rest  '[]' History of DVT   '[]' Phlebitis   '[x]' Swelling in legs   '[]' Varicose veins   '[]' Non-healing ulcers Pulmonary:   '[]' Uses home oxygen   '[]' Productive cough   '[]' Hemoptysis   '[]' Wheeze  '[]' COPD   '[]' Asthma Neurologic:  '[]' Dizziness   '[]' Seizures   '[]' History of stroke   '[]' History of TIA  '[]' Aphasia   '[]' Vissual changes   '[]' Weakness or numbness in arm   '[]' Weakness or numbness in leg Musculoskeletal:   '[]' Joint swelling   '[]' Joint pain   '[]' Low back pain Hematologic:  '[]' Easy bruising  '[]' Easy bleeding   '[]' Hypercoagulable state   '[]' Anemic Gastrointestinal:  '[]' Diarrhea   '[]' Vomiting  '[]' Gastroesophageal reflux/heartburn   '[]' Difficulty swallowing. Genitourinary:  '[]' Chronic kidney disease   '[]' Difficult urination  '[]' Frequent urination   '[]' Blood in urine Skin:  '[]' Rashes   '[x]' Ulcers  Psychological:  '[]' History of anxiety   '[]'  History of major depression.  Physical Examination  BP (!) 168/85 (BP Location: Right Arm, Patient Position: Sitting)   Pulse 86   Resp 18   Ht '5\' 8"'  (1.727 m)   Wt (!) 333 lb (151 kg)   LMP  (LMP Unknown) Comment: age 81  BMI 50.63 kg/m   Gen: WD/WN, NAD Head: [place]/AT, No temporalis wasting.  Ear/Nose/Throat: Hearing grossly intact, nares w/o erythema or drainage Eyes: PER, EOMI, sclera nonicteric.  Neck: Supple, no large masses.   Pulmonary:  Good air  movement, no audible wheezing bilaterally, no use of accessory muscles.  Cardiac: RRR, no JVD Vascular: 2-3+ edema bilaterally with severe venous changes bilaterally.  Venous ulcers noted in the ankle area bilaterally, noninfected Vessel Right Left  Radial Palpable Palpable  Gastrointestinal: Non-distended. No guarding/no peritoneal signs.  Musculoskeletal: M/S 5/5 throughout.  No deformity or atrophy.  Neurologic: CN 2-12 intact. Symmetrical.  Speech is fluent. Motor exam as listed above. Psychiatric: Judgment intact, Mood & affect appropriate for pt's clinical situation. Dermatologic: Venous stasis dermatitis  with ulcers present.  No changes consistent with cellulitis. Lymph : No lichenification or skin changes of chronic lymphedema.  CBC Lab Results  Component Value Date   WBC 4.9 07/18/2018   HGB 13.4 07/18/2018   HCT 41.6 07/18/2018   MCV 82 07/18/2018   PLT 256 07/18/2018    BMET    Component Value Date/Time   NA 136 07/18/2018 0906   NA 138 08/31/2014 0533   K 4.9 07/18/2018 0906   K 4.0 08/31/2014 0533   CL 96 07/18/2018 0906   CL 104 08/31/2014 0533   CO2 25 07/18/2018 0906   CO2 28 08/31/2014 0533   GLUCOSE 186 (H) 07/18/2018 0906   GLUCOSE 138 (H) 09/06/2017 0350   GLUCOSE 116 (H) 08/31/2014 0533   BUN 11 07/18/2018 0906   BUN 12 08/31/2014 0533   CREATININE 0.62 07/18/2018 0906   CREATININE 0.63 08/31/2014 0533   CALCIUM 9.3 07/18/2018 0906   CALCIUM 8.2 (L) 08/31/2014 0533   GFRNONAA 96 07/18/2018 0906   GFRNONAA >60 08/31/2014 0533   GFRNONAA >60 11/05/2013 0647   GFRAA 110 07/18/2018 0906   GFRAA >60 08/31/2014 0533   GFRAA >60 11/05/2013 0647   CrCl cannot be calculated (Patient's most recent lab result is older than the maximum 21 days allowed.).  COAG Lab Results  Component Value Date   INR 0.97 08/26/2017    Radiology No results found.   Assessment/Plan 1. Venous stasis ulcer of ankle with fat layer exposed with varicose veins,  unspecified laterality ([place]) The patient expressed extreme dislike for Unna boots, therefore we will be going with a different solution.  The patient was instructed to utilize penetration ointment on each of her wounds.  She is then to cover each wound with gauze.  She should then placed on a simple knee-high hose, without the plastic portion at the top.  She should then utilize her compression stocking on top of that.  It was emphasized that this should be done daily, with application once she gets up first thing in the morning and she can remove during the evening.  The patient understands that if this does not work appropriately to heal her wounds we will have to transition to [employer].  She will follow-up with [name] in 4 weeks.  The patient will continue to utilize medical grade 1 compression stockings.  The patient will continue to utilize her lymphedema pump.  2. Chronic venous insufficiency See above  3. Coronary artery disease involving native coronary artery of native heart without angina pectoris Continue cardiac and antihypertensive medications as already ordered and reviewed, no changes at this time.  Continue statin as ordered and reviewed, no changes at this time  Nitrates PRN for chest pain   4. Non-ST elevation myocardial infarction (NSTEMI), subendocardial infarction, subsequent episode of care [facility]) Continue cardiac and antihypertensive medications as already ordered and reviewed, no changes at this time.  Continue statin as ordered and reviewed, no changes at this time  Nitrates PRN for chest pain   5. Essential hypertension Continue antihypertensive medications as already ordered, these medications have been reviewed and there are no changes at this time.  Kris Hartmann, NP    Hortencia Pilar, MD  08/10/2018 3:32 PM

## 2018-08-11 ENCOUNTER — Encounter (INDEPENDENT_AMBULATORY_CARE_PROVIDER_SITE_OTHER): Payer: Self-pay | Admitting: Vascular Surgery

## 2018-08-11 ENCOUNTER — Ambulatory Visit (INDEPENDENT_AMBULATORY_CARE_PROVIDER_SITE_OTHER): Payer: BLUE CROSS/BLUE SHIELD | Admitting: Nurse Practitioner

## 2018-08-11 VITALS — BP 168/85 | HR 86 | Resp 18 | Ht 68.0 in | Wt 333.0 lb

## 2018-08-11 DIAGNOSIS — I872 Venous insufficiency (chronic) (peripheral): Secondary | ICD-10-CM

## 2018-08-11 DIAGNOSIS — I214 Non-ST elevation (NSTEMI) myocardial infarction: Secondary | ICD-10-CM

## 2018-08-11 DIAGNOSIS — I2510 Atherosclerotic heart disease of native coronary artery without angina pectoris: Secondary | ICD-10-CM | POA: Diagnosis not present

## 2018-08-11 DIAGNOSIS — I83003 Varicose veins of unspecified lower extremity with ulcer of ankle: Secondary | ICD-10-CM

## 2018-08-11 DIAGNOSIS — I10 Essential (primary) hypertension: Secondary | ICD-10-CM

## 2018-08-11 DIAGNOSIS — L97302 Non-pressure chronic ulcer of unspecified ankle with fat layer exposed: Secondary | ICD-10-CM

## 2018-08-20 ENCOUNTER — Ambulatory Visit: Payer: Self-pay | Admitting: Family Medicine

## 2018-09-05 ENCOUNTER — Other Ambulatory Visit: Payer: Self-pay | Admitting: Family Medicine

## 2018-09-05 DIAGNOSIS — E11628 Type 2 diabetes mellitus with other skin complications: Secondary | ICD-10-CM

## 2018-09-07 NOTE — Progress Notes (Deleted)
Cardiology Office Note  Date:  09/07/2018   ID:  [name] [name], DOB September 17, 1951, MRN [id number]  PCP:  Virginia Crews, MD   No chief complaint on file.   HPI:   Rheumatoid arthritis CAD, prior stent 04/2014 smoker Diabetes type II A1c has been continually increasing from  9.8, 10.1  Morbid obesity Venous ulcers/legs Referred by Dr. Brita Romp for consultation of her CAD, NSTEMI, chronic systolic CHF  cellulitis is not improved, and she has noticed intermittent fevers up to 100 degrees. She finished her abx. Continues to have redness, open wounds, and drainage of clear fluid.  not currently on Lasix, losartan, or Coreg which she was previously on  Echo 2016 Left ventricle: The cavity size was moderately dilated. Systolic   function was severely reduced. The estimated ejection fraction   was in the range of 25% to 30%.  Could not perform lexiscan myoview 10/2015?  [place] " stress test showing fixed anterior apical myocardial infarction and no evidence of myocardial ischemia"  Had systolic CHF after hysterectomy  PMH:   has a past medical history of Arthritis, BRCA negative (07/2017), Cancer ([place]), Cellulitis of both lower extremities, CHF (congestive heart failure) ([place]), Coronary artery disease, Diabetes mellitus without complication ([place]), Family history of breast cancer (07/2017), Family history of ovarian cancer, Fibromyalgia, Fibromyalgia affecting shoulder region, Hyperlipidemia, Hypertension, and Spinal stenosis.  PSH:    Past Surgical History:  Procedure Laterality Date  . ABLATION SAPHENOUS VEIN W/ RFA    . ANAL FISSURECTOMY  01/2004  . CORONARY ANGIOPLASTY    . CYSTOSCOPY  09/03/2017   Procedure: CYSTOSCOPY;  Surgeon: Gae Dry, MD;  Location: [facility];  Service: Gynecology;;  . DILATION AND CURETTAGE OF UTERUS    . HERNIA REPAIR    . LAPAROSCOPIC HYSTERECTOMY N/A 09/03/2017   Procedure: HYSTERECTOMY TOTAL LAPAROSCOPIC BSO;  Surgeon: Gae Dry, MD;  Location: [facility];  Service: Gynecology;  Laterality: N/A;  . MEDIAL PARTIAL KNEE REPLACEMENT Left 01/06/2014  . UMBILICAL HERNIA REPAIR N/A 09/05/2015   Procedure: HERNIA REPAIR incarcerated UMBILICAL ADULT;  Surgeon: Sherri Rad, MD;  Location: [facility];  Service: General;  Laterality: N/A;  . Vascular Stent  11/04/2013    Current Outpatient Medications  Medication Sig Dispense Refill  . cetirizine (ZYRTEC) 10 MG tablet TAKE 1 TABLET (10 MG TOTAL) BY MOUTH DAILY. (NOT COVER*) 90 tablet 3  . citalopram (CELEXA) 40 MG tablet TAKE 1 TABLET BY MOUTH EVERY DAY 90 tablet 3  . Dulaglutide (TRULICITY) 1.5 WK/0.8UP SOPN Inject 1.5 mg into the skin once a week. 4 pen 5  . glipiZIDE (GLUCOTROL) 5 MG tablet TAKE 1 TABLET BY MOUTH TWICE A DAY BEFORE MEALS 180 tablet 0  . glucose blood (ACCU-CHEK AVIVA PLUS) test strip To check blood sugar once a day.  DX: E11.9 100 each 1  . mirabegron ER (MYRBETRIQ) 50 MG TB24 tablet Take 1 tablet (50 mg total) by mouth daily. 30 tablet 11  . [START ON 09/11/2018] oxyCODONE-acetaminophen (PERCOCET) 10-325 MG tablet Take 1 tablet by mouth every 8 (eight) hours as needed for pain. 90 tablet 0  . Vitamin D, Ergocalciferol, (DRISDOL) 50000 units CAPS capsule Take 1 capsule (50,000 Units total) by mouth every 7 (seven) days. 12 capsule 0   No current facility-administered medications for this visit.      Allergies:   Latex; Doxycycline; and Sulfa antibiotics   Social History:  The patient  reports that she quit smoking about 21 years ago. She  has a 29.00 pack-year smoking history. She has never used smokeless tobacco. She reports that she does not drink alcohol or use drugs.   Family History:   family history includes Aneurysm in her father; Breast cancer (age of onset: 60) in her sister; Breast cancer (age of onset: 49) in her sister; COPD in her father and sister; Cataracts in her father and mother; Colon cancer in her paternal uncle and paternal uncle; Dementia  in her father and mother; Diabetes in her father; Fibromyalgia in her sister; Heart attack in her brother; Hypertension in her mother and sister; Melanoma in her father; Migraines in her brother and sister; Ovarian cancer (age of onset: 64) in her sister; Thyroid disease in her mother; Uterine cancer in her cousin and cousin.    Review of Systems: ROS   PHYSICAL EXAM: VS:  LMP  (LMP Unknown) Comment: age 29 , BMI There is no height or weight on file to calculate BMI. GEN: Well nourished, well developed, in no acute distress  HEENT: normal  Neck: no JVD, carotid bruits, or masses Cardiac: RRR; no murmurs, rubs, or gallops,no edema  Respiratory:  clear to auscultation bilaterally, normal work of breathing GI: soft, nontender, nondistended, + BS MS: no deformity or atrophy  Skin: warm and dry, no rash Neuro:  Strength and sensation are intact Psych: euthymic mood, full affect    Recent Labs: 07/18/2018: ALT 8; BUN 11; Creatinine, Ser 0.62; Hemoglobin 13.4; Platelets 256; Potassium 4.9; Sodium 136; TSH 2.160    Lipid Panel Lab Results  Component Value Date   CHOL 299 (H) 07/18/2018   HDL 41 07/18/2018   LDLCALC 214 (H) 07/18/2018   TRIG 222 (H) 07/18/2018      Wt Readings from Last 3 Encounters:  08/11/18 (!) 333 lb (151 kg)  07/09/18 (!) 331 lb (150.1 kg)  07/08/18 (!) 328 lb (148.8 kg)       ASSESSMENT AND PLAN:  No diagnosis found.   Disposition:   F/U  6 months  No orders of the defined types were placed in this encounter.    Signed, Esmond Plants, M.D., Ph.D. 09/07/2018  [place], [place]

## 2018-09-08 ENCOUNTER — Ambulatory Visit: Payer: BLUE CROSS/BLUE SHIELD | Admitting: Cardiovascular Disease

## 2018-09-08 ENCOUNTER — Ambulatory Visit (INDEPENDENT_AMBULATORY_CARE_PROVIDER_SITE_OTHER): Payer: BLUE CROSS/BLUE SHIELD | Admitting: Vascular Surgery

## 2018-09-09 ENCOUNTER — Encounter: Payer: Self-pay | Admitting: Cardiovascular Disease

## 2018-09-20 IMAGING — CT CT ANGIO CHEST
2 of 6 series · 18 of 46 positions shown · IV contrast (APPLIED)
Comparison: Radiograph September 03, 2017.

CLINICAL DATA: Acute on chronic heart failure.  Hypoxia.

EXAM:
CT ANGIOGRAPHY CHEST WITH CONTRAST
TECHNIQUE: Multidetector CT imaging of the chest was performed using the
standard protocol during bolus administration of intravenous
contrast. Multiplanar CT image reconstructions and MIPs were
obtained to evaluate the vascular anatomy.
CONTRAST:  75 mL of Isovue 370 intravenously.

[Series 6: thins · axial · 0.75mm/px · z∈[-265,-28]mm · 15 of 261 slices shown]
[im 12/261  lung]
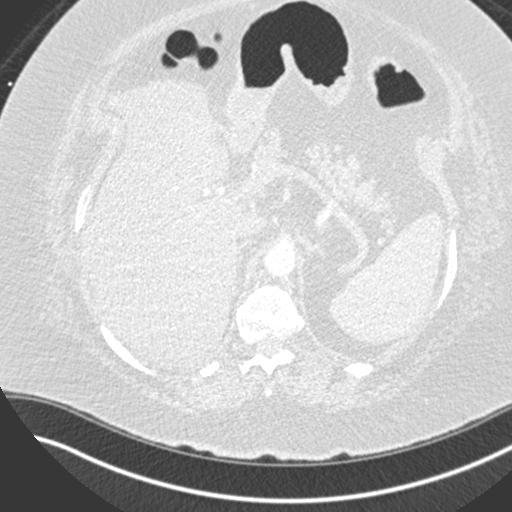
[im 34/261  soft-tissue]
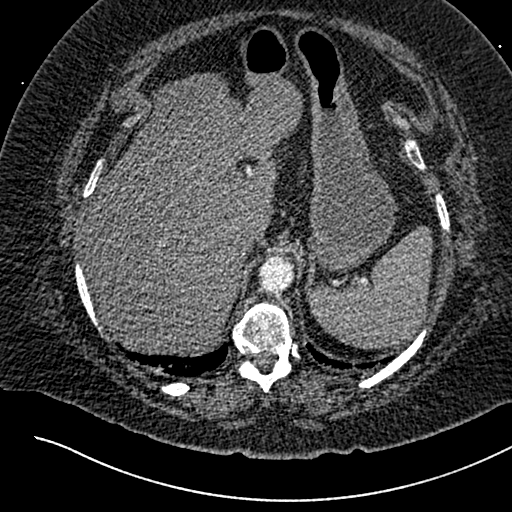
[im 46/261  lung]
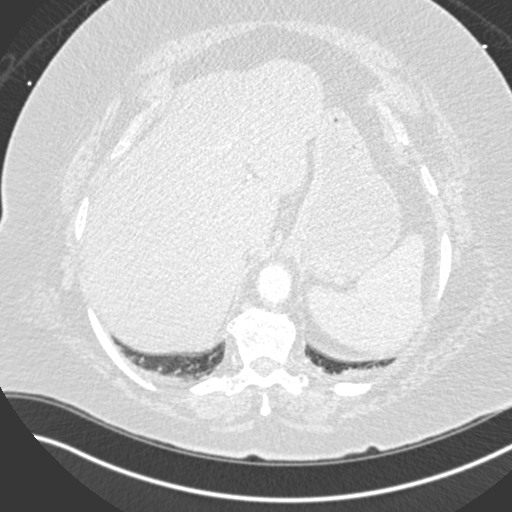
[im 68/261  soft-tissue]
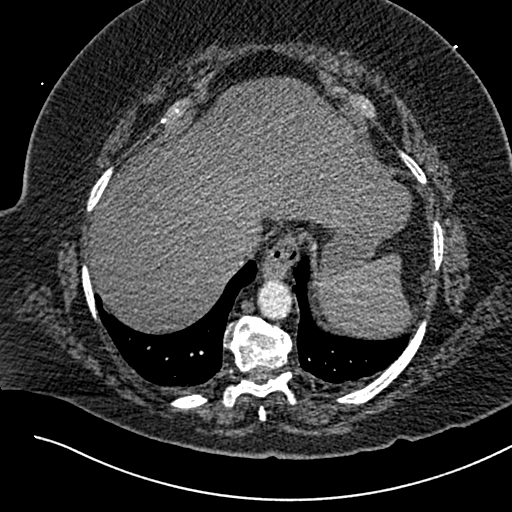
[im 80/261  lung]
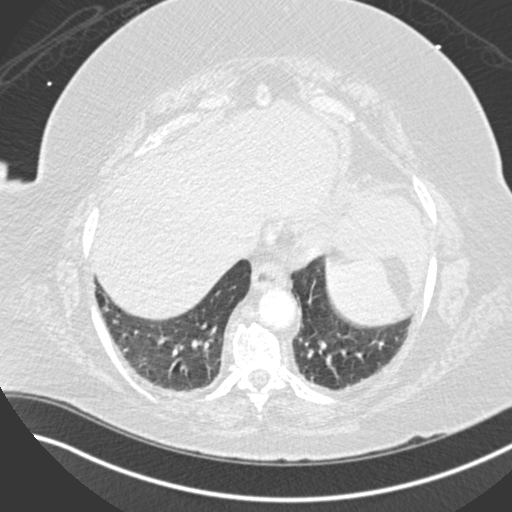
[im 102/261  soft-tissue]
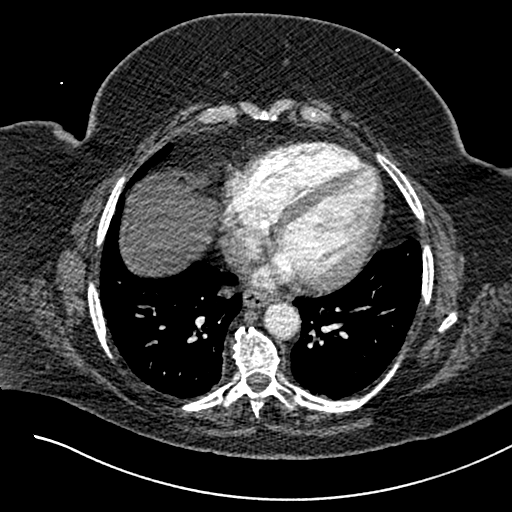
[im 114/261  lung]
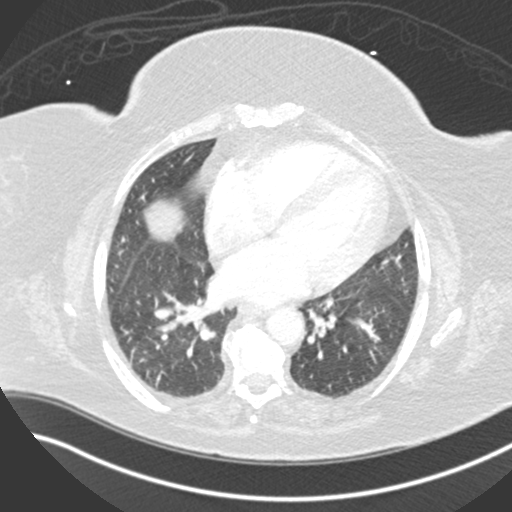
[im 136/261  soft-tissue]
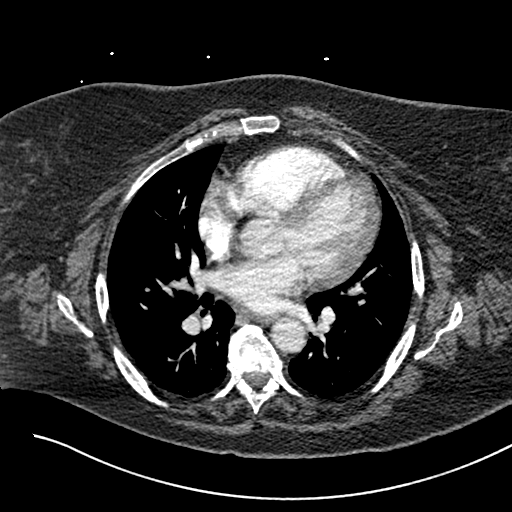
[im 147/261  lung]
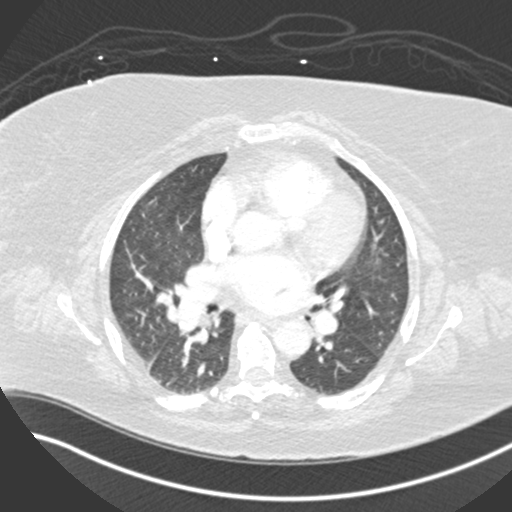
[im 159/261  soft-tissue]
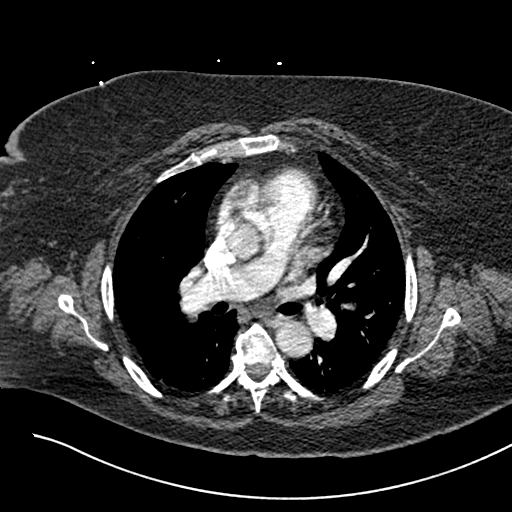
[im 181/261  lung]
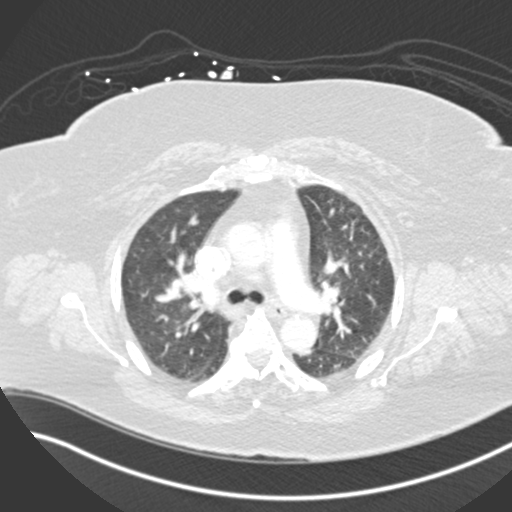
[im 193/261  soft-tissue]
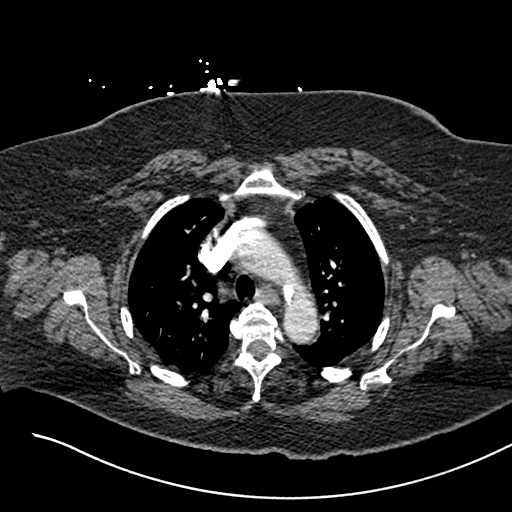
[im 215/261  lung]
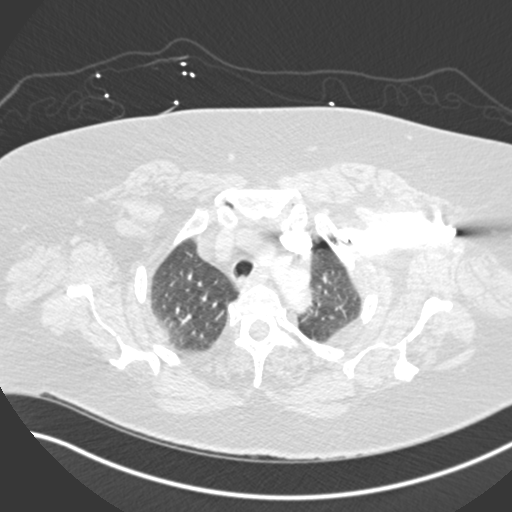
[im 227/261  soft-tissue]
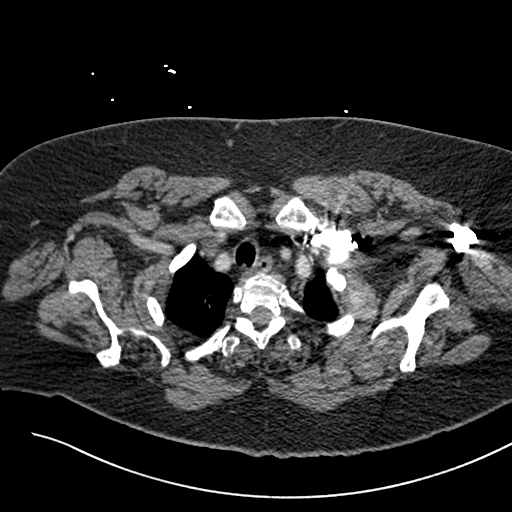
[im 249/261  lung]
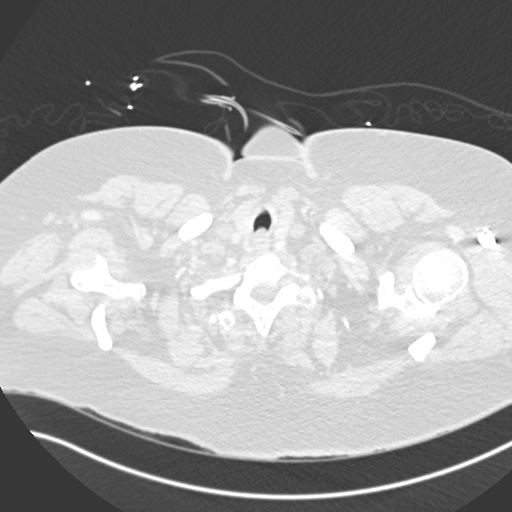

[Series 8: coronal mpr · coronal · 0.52mm/px · 3 of 95 slices shown]
[im 36/95  soft-tissue]
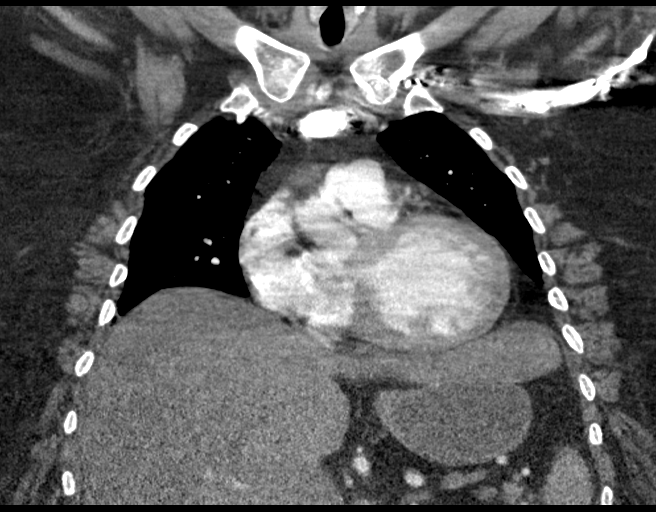
[im 48/95  soft-tissue]
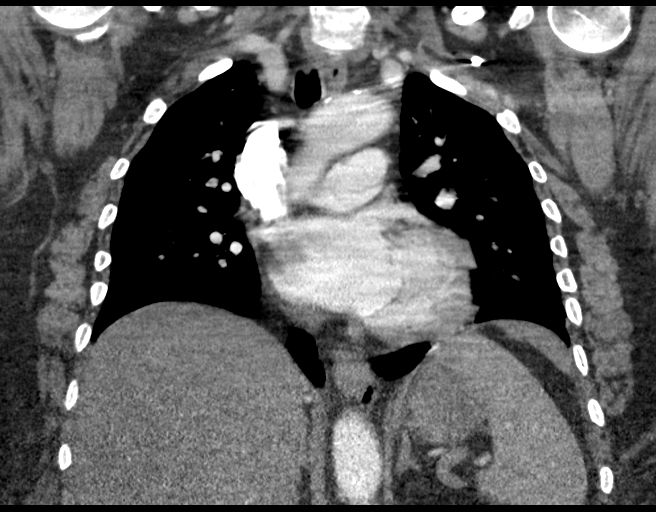
[im 59/95  soft-tissue]
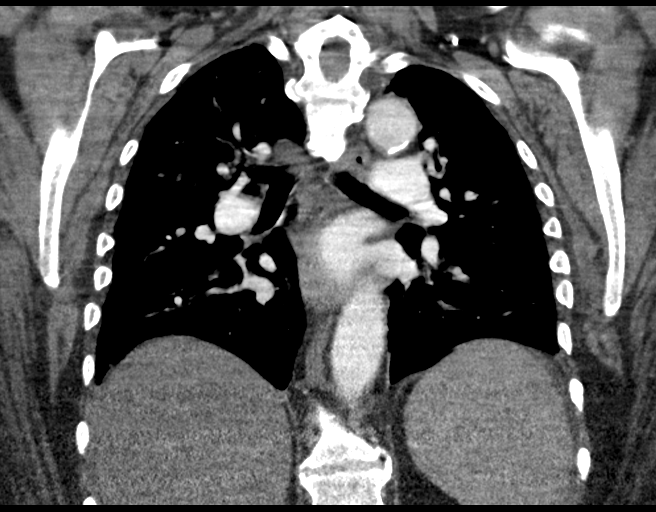

[18 of 46 positions shown; findings below may reference images not displayed]

FINDINGS: Cardiovascular: Satisfactory opacification of the pulmonary arteries
to the segmental level. No evidence of pulmonary embolism. Mild
cardiomegaly. No pericardial effusion. Atherosclerosis of thoracic
aorta is noted without aneurysm or dissection.

Mediastinum/Nodes: No enlarged mediastinal, hilar, or axillary lymph
nodes. Thyroid gland, trachea, and esophagus demonstrate no
significant findings.

Lungs/Pleura: Lungs are clear. No pleural effusion or pneumothorax.

Upper Abdomen: Solitary gallstone is noted.

Musculoskeletal: No chest wall abnormality. No acute or significant
osseous findings.

Review of the MIP images confirms the above findings.
IMPRESSION: No definite evidence of pulmonary embolus.

Solitary gallstone.

Aortic Atherosclerosis ([id number].K).

## 2018-10-09 ENCOUNTER — Encounter: Payer: Self-pay | Admitting: Nurse Practitioner

## 2018-10-09 ENCOUNTER — Other Ambulatory Visit: Payer: Self-pay

## 2018-10-09 ENCOUNTER — Ambulatory Visit: Payer: Medicare HMO | Attending: Nurse Practitioner | Admitting: Nurse Practitioner

## 2018-10-09 VITALS — BP 138/93 | HR 83 | Temp 98.2°F | Ht 68.0 in | Wt 322.0 lb

## 2018-10-09 DIAGNOSIS — Z79899 Other long term (current) drug therapy: Secondary | ICD-10-CM | POA: Diagnosis not present

## 2018-10-09 DIAGNOSIS — Z9889 Other specified postprocedural states: Secondary | ICD-10-CM | POA: Diagnosis not present

## 2018-10-09 DIAGNOSIS — M069 Rheumatoid arthritis, unspecified: Secondary | ICD-10-CM | POA: Insufficient documentation

## 2018-10-09 DIAGNOSIS — G8929 Other chronic pain: Secondary | ICD-10-CM | POA: Diagnosis not present

## 2018-10-09 DIAGNOSIS — I5022 Chronic systolic (congestive) heart failure: Secondary | ICD-10-CM | POA: Insufficient documentation

## 2018-10-09 DIAGNOSIS — M0579 Rheumatoid arthritis with rheumatoid factor of multiple sites without organ or systems involvement: Secondary | ICD-10-CM

## 2018-10-09 DIAGNOSIS — M5442 Lumbago with sciatica, left side: Secondary | ICD-10-CM | POA: Diagnosis not present

## 2018-10-09 DIAGNOSIS — F1290 Cannabis use, unspecified, uncomplicated: Secondary | ICD-10-CM

## 2018-10-09 DIAGNOSIS — Z79891 Long term (current) use of opiate analgesic: Secondary | ICD-10-CM | POA: Diagnosis not present

## 2018-10-09 DIAGNOSIS — M5441 Lumbago with sciatica, right side: Secondary | ICD-10-CM | POA: Insufficient documentation

## 2018-10-09 DIAGNOSIS — I110 Hypertensive heart disease with heart failure: Secondary | ICD-10-CM | POA: Insufficient documentation

## 2018-10-09 DIAGNOSIS — I214 Non-ST elevation (NSTEMI) myocardial infarction: Secondary | ICD-10-CM | POA: Diagnosis not present

## 2018-10-09 DIAGNOSIS — Z90710 Acquired absence of both cervix and uterus: Secondary | ICD-10-CM | POA: Diagnosis not present

## 2018-10-09 DIAGNOSIS — Z5181 Encounter for therapeutic drug level monitoring: Secondary | ICD-10-CM | POA: Diagnosis not present

## 2018-10-09 DIAGNOSIS — G894 Chronic pain syndrome: Secondary | ICD-10-CM | POA: Insufficient documentation

## 2018-10-09 DIAGNOSIS — M25539 Pain in unspecified wrist: Secondary | ICD-10-CM | POA: Diagnosis not present

## 2018-10-09 DIAGNOSIS — M47816 Spondylosis without myelopathy or radiculopathy, lumbar region: Secondary | ICD-10-CM | POA: Diagnosis not present

## 2018-10-09 DIAGNOSIS — Z87891 Personal history of nicotine dependence: Secondary | ICD-10-CM | POA: Diagnosis not present

## 2018-10-09 MED ORDER — OXYCODONE-ACETAMINOPHEN 10-325 MG PO TABS: 1.0000 | ORAL_TABLET | Freq: Three times a day (TID) | ORAL | 0 refills | Status: DC | PRN

## 2018-10-09 NOTE — Patient Instructions (Addendum)
____________________________________________________________________________________________  Medication Rules  Applies to: All patients receiving prescriptions (written or electronic).  Pharmacy of record: Pharmacy where electronic prescriptions will be sent. If written prescriptions are taken to a different pharmacy, please inform the nursing staff. The pharmacy listed in the electronic medical record should be the one where you would like electronic prescriptions to be sent.  Prescription refills: Only during scheduled appointments. Applies to both, written and electronic prescriptions.  NOTE: The following applies primarily to controlled substances (Opioid* Pain Medications).   Patient's responsibilities: 1. Pain Pills: Bring all pain pills to every appointment (except for procedure appointments). 2. Pill Bottles: Bring pills in original pharmacy bottle. Always bring newest bottle. Bring bottle, even if empty. 3. Medication refills: You are responsible for knowing and keeping track of what medications you need refilled. The day before your appointment, write a list of all prescriptions that need to be refilled. Bring that list to your appointment and give it to the admitting nurse. Prescriptions will be written only during appointments. If you forget a medication, it will not be "Called in", "Faxed", or "electronically sent". You will need to get another appointment to get these prescribed. 4. Prescription Accuracy: You are responsible for carefully inspecting your prescriptions before leaving our office. Have the discharge nurse carefully go over each prescription with you, before taking them home. Make sure that your name is accurately spelled, that your address is correct. Check the name and dose of your medication to make sure it is accurate. Check the number of pills, and the written instructions to make sure they are clear and accurate. Make sure that you are given enough medication to last  until your next medication refill appointment. 5. Taking Medication: Take medication as prescribed. Never take more pills than instructed. Never take medication more frequently than prescribed. Taking less pills or less frequently is permitted and encouraged, when it comes to controlled substances (written prescriptions).  6. Inform other Doctors: Always inform, all of your healthcare providers, of all the medications you take. 7. Pain Medication from other Providers: You are not allowed to accept any additional pain medication from any other Doctor or Healthcare provider. There are two exceptions to this rule. (see below) In the event that you require additional pain medication, you are responsible for notifying us, as stated below. 8. Medication Agreement: You are responsible for carefully reading and following our Medication Agreement. This must be signed before receiving any prescriptions from our practice. Safely store a copy of your signed Agreement. Violations to the Agreement will result in no further prescriptions. (Additional copies of our Medication Agreement are available upon request.) 9. Laws, Rules, & Regulations: All patients are expected to follow all Federal and State Laws, Statutes, Rules, & Regulations. Ignorance of the Laws does not constitute a valid excuse. The use of any illegal substances is prohibited. 10. Adopted CDC guidelines & recommendations: Target dosing levels will be at or below 60 MME/day. Use of benzodiazepines** is not recommended.  Exceptions: There are only two exceptions to the rule of not receiving pain medications from other Healthcare Providers. 1. Exception #1 (Emergencies): In the event of an emergency (i.e.: accident requiring emergency care), you are allowed to receive additional pain medication. However, you are responsible for: As soon as you are able, call our office [phone], at any time of the day or night, and leave a message stating your name, the  date and nature of the emergency, and the name and dose of the medication   prescribed. In the event that your call is answered by a member of our staff, make sure to document and save the date, time, and the name of the person that took your information.  2. Exception #2 (Planned Surgery): In the event that you are scheduled by another doctor or dentist to have any type of surgery or procedure, you are allowed (for a period no longer than 30 days), to receive additional pain medication, for the acute post-op pain. However, in this case, you are responsible for picking up a copy of our "Post-op Pain Management for Surgeons" handout, and giving it to your surgeon or dentist. This document is available at our office, and does not require an appointment to obtain it. Simply go to our office during business hours (Monday-Thursday from 8:00 AM to 4:00 PM) (Friday 8:00 AM to 12:00 Noon) or if you have a scheduled appointment with us, prior to your surgery, and ask for it by name. In addition, you will need to provide us with your name, name of your surgeon, type of surgery, and date of procedure or surgery.  *Opioid medications include: morphine, codeine, oxycodone, oxymorphone, hydrocodone, hydromorphone, meperidine, tramadol, tapentadol, buprenorphine, fentanyl, methadone. **Benzodiazepine medications include: diazepam (Valium), alprazolam (Xanax), clonazepam (Klonopine), lorazepam (Ativan), clorazepate (Tranxene), chlordiazepoxide (Librium), estazolam (Prosom), oxazepam (Serax), temazepam (Restoril), triazolam (Halcion) (Last updated: 01/30/2018) ____________________________________________________________________________________________   ____________________________________________________________________________________________  CANNABIDIOL (AKA: CBD Oil or Pills)  Applies to: All patients receiving prescriptions of controlled substances (written and/or electronic).  General Information: Cannabidiol  (CBD) was discovered in 1940. It is one of some 113 identified cannabinoids in cannabis (Marijuana) plants, accounting for up to 40% of the plant's extract. As of 2018, preliminary clinical research on cannabidiol included studies of anxiety, cognition, movement disorders, and pain.  Cannabidiol is consummed in multiple ways, including inhalation of cannabis smoke or vapor, as an aerosol spray into the cheek, and by mouth. It may be supplied as CBD oil containing CBD as the active ingredient (no added tetrahydrocannabinol (THC) or terpenes), a full-plant CBD-dominant hemp extract oil, capsules, dried cannabis, or as a liquid solution. CBD is thought not have the same psychoactivity as THC, and may affect the actions of THC. Studies suggest that CBD may interact with different biological targets, including cannabinoid receptors and other neurotransmitter receptors. As of 2018 the mechanism of action for its biological effects has not been determined.  In the [place], cannabidiol has a limited approval by the Food and Drug Administration (FDA) for treatment of only two types of epilepsy disorders. The side effects of long-term use of the drug include somnolence, decreased appetite, diarrhea, fatigue, malaise, weakness, sleeping problems, and others.  CBD remains a Schedule I drug prohibited for any use.  Legality: Some manufacturers ship CBD products nationally, an illegal action which the FDA has not enforced in 2018, with CBD remaining the subject of an FDA investigational new drug evaluation, and is not considered legal as a dietary supplement or food ingredient as of December 2018. Federal illegality has made it difficult historically to conduct research on CBD. CBD is openly sold in head shops and health food stores in some states where such sales have not been explicitly legalized.  Warning: Because it is not FDA approved for general use or treatment of pain, it is not required to undergo the  same manufacturing controls as prescription drugs.  This means that the available cannabidiol (CBD) may be contaminated with THC.  If this is the case, it will trigger a   positive urine drug screen (UDS) test for cannabinoids (Marijuana).  Because a positive UDS for illicit substances is a violation of our medication agreement, your opioid analgesics (pain medicine) may be permanently discontinued. (Last update: 02/20/2018) ____________________________________________________________________________________________   BMI Assessment: Estimated body mass index is 48.96 kg/m as calculated from the following:   Height as of this encounter: 5\' 8"  (1.727 m).   Weight as of this encounter: 322 lb (146.1 kg).  BMI interpretation table: BMI level Category Range association with higher incidence of chronic pain  <18 kg/m2 Underweight   18.5-24.9 kg/m2 Ideal body weight   25-29.9 kg/m2 Overweight Increased incidence by 20%  30-34.9 kg/m2 Obese (Class I) Increased incidence by 68%  35-39.9 kg/m2 Severe obesity (Class II) Increased incidence by 136%  >40 kg/m2 Extreme obesity (Class [name]) Increased incidence by 254%   Patient's current BMI Ideal Body weight  Body mass index is 48.96 kg/m. Ideal body weight: 63.9 kg (140 lb 14 oz) Adjusted ideal body weight: 96.8 kg (213 lb 5.2 oz)   BMI Readings from Last 4 Encounters:  10/09/18 48.96 kg/m  08/11/18 50.63 kg/m  07/09/18 50.33 kg/m  07/08/18 49.87 kg/m   Wt Readings from Last 4 Encounters:  10/09/18 (!) 322 lb (146.1 kg)  08/11/18 (!) 333 lb (151 kg)  07/09/18 (!) 331 lb (150.1 kg)  07/08/18 (!) 328 lb (148.8 kg)

## 2018-10-09 NOTE — Progress Notes (Signed)
Patient's Name: [name] [name]  MRN: [id number]  Referring Provider: Virginia Crews, MD  DOB: [birth year]  PCP: Virginia Crews, MD  DOS: 10/09/2018  Note by: Vevelyn Francois NP  Service setting: Ambulatory outpatient  Specialty: Interventional Pain Management  Location: [facility] (AMB) Pain Management Facility    Patient type: Established    Primary Reason(s) for Visit: Encounter for prescription drug management. (Level of risk: moderate)  CC: Wrist Pain  HPI  [name] [name] is a 67 y.o. year old, female patient, who comes today for a medication management evaluation. She has Allergic rhinitis; Chronic venous insufficiency; Atherosclerosis of coronary artery; Clinical depression; Diabetes ([place]); Migraines; Venous ulcer of ankle ([place]); Gastroduodenal ulcer; Venous stasis; Rheumatoid arthritis ([place]); Non-ST elevation myocardial infarction (NSTEMI), subendocardial infarction, subsequent episode of care [facility]); Chronic systolic heart failure ([place]); Pure hypercholesterolemia; HTN (hypertension); Lymphedema; Right shoulder pain; Encounter for long-term (current) use of other medications; Atypical endometrial hyperplasia; Chronic pain disorder; Complex endometrial hyperplasia with atypia; Peripheral neuropathy; Urinary incontinence; Chronic systolic CHF (congestive heart failure), NYHA class 3 ([place]); Osteoarthritis; Coronary artery disease; Obesity, unspecified; Peptic ulcer disease; Lumbar spondylosis; Chronic bilateral low back pain with bilateral sciatica; Chronic pain of both lower extremities; Chronic pain syndrome; Long term current use of opiate analgesic; General weakness; Marijuana use / CBD oil use ; and Obesity on their problem list. Her primarily concern today is the Wrist Pain  Pain Assessment: Location: Left Wrist Radiating: pain radiaties down to fingers Onset: More than a month ago Duration: Chronic pain Quality: Constant, Throbbing, Aching, Sharp, Shooting Severity: 7 /10 (subjective,  self-reported pain score)  Note: Reported level is compatible with observation. Clinically the patient looks like a 3/10 A 3/10 is viewed as "Moderate" and described as significantly interfering with activities of daily living (ADL). It becomes difficult to feed, bathe, get dressed, get on and off the toilet or to perform personal hygiene functions. Difficult to get in and out of bed or a chair without assistance. Very distracting. With effort, it can be ignored when deeply involved in activities. Information on the proper use of the pain scale provided to the patient today. When using our objective Pain Scale, levels between 6 and 10/10 are said to belong in an emergency room, as it progressively worsens from a 6/10, described as severely limiting, requiring emergency care not usually available at an outpatient pain management facility. At a 6/10 level, communication becomes difficult and requires great effort. Assistance to reach the emergency department may be required. Facial flushing and profuse sweating along with potentially dangerous increases in heart rate and blood pressure will be evident. Effect on ADL: limits my daily activites Timing: Constant Modifying factors: wrist support, medications BP: (!) 138/93  HR: 83  [name] [name] was last scheduled for an appointment on 07/08/2018 for medication management. During today's appointment we reviewed [name] [name] chronic pain status, as well as her outpatient medication regimen. She admits that the RA is worse in her wrist.   The patient  reports that she does not use drugs. Her body mass index is 48.96 kg/m.  Further details on both, my assessment(s), as well as the proposed treatment plan, please see below.  Controlled Substance Pharmacotherapy Assessment REMS (Risk Evaluation and Mitigation Strategy)  Analgesic: Oxycodone 10 mg 3 times daily MME/day: 45 mg/day.   [name] Fischer, RN  10/09/2018 11:36 AM  Sign at close encounter Nursing Pain  Medication Assessment:  Safety precautions to be maintained throughout the outpatient stay will  include: orient to surroundings, keep bed in low position, maintain call bell within reach at all times, provide assistance with transfer out of bed and ambulation.  Medication Inspection Compliance: Pill count conducted under aseptic conditions, in front of the patient. Neither the pills nor the bottle was removed from the patient's sight at any time. Once count was completed pills were immediately returned to the patient in their original bottle.  Medication: Oxycodone/APAP Pill/Patch Count: 7 of 90 pills remain Pill/Patch Appearance: Markings consistent with prescribed medication Bottle Appearance: Standard pharmacy container. Clearly labeled. Filled Date: 10 / 10 / 2018 Last Medication intake:  Today   Pharmacokinetics: Liberation and absorption (onset of action): WNL Distribution (time to peak effect): WNL Metabolism and excretion (duration of action): WNL         Pharmacodynamics: Desired effects: Analgesia: [name] [name] reports >50% benefit. Functional ability: Patient reports that medication allows her to accomplish basic ADLs Clinically meaningful improvement in function (CMIF): Sustained CMIF goals met Perceived effectiveness: Described as relatively effective, allowing for increase in activities of daily living (ADL) Undesirable effects: Side-effects or Adverse reactions: None reported Monitoring: [place] PMP: Online review of the past 98-monthperiod conducted. Compliant with practice rules and regulations Last UDS on record: Summary  Date Value Ref Range Status  07/08/2018 FINAL  Final    Comment:    ==================================================================== TOXASSURE SELECT 13 (MW) ==================================================================== Test                             Result       Flag       Units Drug Present and Declared for Prescription Verification    Oxycodone                      3362         EXPECTED   ng/mg creat   Oxymorphone                    284          EXPECTED   ng/mg creat   Noroxycodone                   3632         EXPECTED   ng/mg creat   Noroxymorphone                 92           EXPECTED   ng/mg creat    Sources of oxycodone are scheduled prescription medications.    Oxymorphone, noroxycodone, and noroxymorphone are expected    metabolites of oxycodone. Oxymorphone is also available as a    scheduled prescription medication. Drug Present not Declared for Prescription Verification   Carboxy-THC                    315          UNEXPECTED ng/mg creat    Carboxy-THC is a metabolite of tetrahydrocannabinol  (THC).    Source of [employer]is most commonly illicit, but THC is also present    in a scheduled prescription medication.   Morphine                       257          UNEXPECTED ng/mg creat    Potential sources of morphine include administration of codeine  or morphine, use of heroin, or ingestion of poppy seeds. ==================================================================== Test                      Result    Flag   Units      Ref Range   Creatinine              87               mg/dL      >=20 ==================================================================== Declared Medications:  The flagging and interpretation on this report are based on the  following declared medications.  Unexpected results may arise from  inaccuracies in the declared medications.  **Note: The testing scope of this panel includes these medications:  Oxycodone (Percocet)  **Note: The testing scope of this panel does not include following  reported medications:  Acetaminophen (Percocet)  Cetirizine (Zyrtec)  Citalopram (Celexa)  Dulaglutide (Trulicity)  Gabapentin  Glipizide  Mirabegron ==================================================================== For clinical consultation, please call [phone]. ====================================================================    UDS interpretation: Non-Compliant Undeclared illicit substance detected She admits that she uses the CBD oil  Medication Assessment Form: Discrepancies found between patient's report and information collected Treatment compliance: Deficiencies noted and steps taken to remind the patient of the seriousness of adequate therapy compliance Risk Assessment Profile: Aberrant behavior: See prior evaluations. None observed or detected today Comorbid factors increasing risk of overdose: caucasian Opioid risk tool (ORT) (Total Score): 1 Personal History of Substance Abuse (SUD-Substance use disorder):  Alcohol: Negative  Illegal Drugs: Negative  Rx Drugs: Negative  ORT Risk Level calculation: Low Risk Risk of substance use disorder (SUD): Moderate Opioid Risk Tool - 10/09/18 1128      Family History of Substance Abuse   Alcohol  Negative    Illegal Drugs  Negative    Rx Drugs  Negative      Personal History of Substance Abuse   Alcohol  Negative    Illegal Drugs  Negative    Rx Drugs  Negative      Age   Age between 59-45 years   No      History of Preadolescent Sexual Abuse   History of Preadolescent Sexual Abuse  Negative or Female      Psychological Disease   Psychological Disease  Negative    Depression  Positive      Total Score   Opioid Risk Tool Scoring  1    Opioid Risk Interpretation  Low Risk      ORT Scoring interpretation table:  Score <3 = Low Risk for SUD  Score between 4-7 = Moderate Risk for SUD  Score >8 = High Risk for Opioid Abuse   Risk Mitigation Strategies:  Patient Counseling: Covered Patient-Prescriber Agreement (PPA): Present and active  Notification to other healthcare providers: Done  Pharmacologic Plan: Therapy adjustment:           She will be monitored monthly.   Laboratory Chemistry  Inflammation Markers (CRP: Acute Phase) (ESR: Chronic Phase) Lab Results   Component Value Date   LATICACIDVEN 1.4 09/05/2015                         Rheumatology Markers No results found for: RF, ANA, LABURIC, URICUR, LYMEIGGIGMAB, LYMEABIGMQN, HLAB27                      Renal Function Markers Lab Results  Component Value Date   BUN 11 07/18/2018  CREATININE 0.62 07/18/2018   BCR 18 07/18/2018   GFRAA 110 07/18/2018   GFRNONAA 96 07/18/2018                             Hepatic Function Markers Lab Results  Component Value Date   AST 14 07/18/2018   ALT 8 07/18/2018   ALBUMIN 3.8 07/18/2018   ALKPHOS 77 07/18/2018   LIPASE 20 (L) 09/04/2015                        Electrolytes Lab Results  Component Value Date   NA 136 07/18/2018   K 4.9 07/18/2018   CL 96 07/18/2018   CALCIUM 9.3 07/18/2018                        Neuropathy Markers Lab Results  Component Value Date   VITAMINB12 326 07/18/2018   HGBA1C 10.1 (A) 07/09/2018   HIV Non Reactive 07/18/2017                        CNS Tests No results found for: COLORCSF, APPEARCSF, RBCCOUNTCSF, WBCCSF, POLYSCSF, LYMPHSCSF, EOSCSF, PROTEINCSF, GLUCCSF, JCVIRUS, CSFOLI, IGGCSF                      Bone Pathology Markers Lab Results  Component Value Date   VD25OH 16.5 (L) 07/18/2018                         Coagulation Parameters Lab Results  Component Value Date   INR 0.97 08/26/2017   LABPROT 12.8 08/26/2017   APTT 24 08/26/2017   PLT 256 07/18/2018                        Cardiovascular Markers Lab Results  Component Value Date   CKTOTAL 62 11/04/2013   CKMB 1.2 11/04/2013   TROPONINI 1.00 (H) 09/05/2015   HGB 13.4 07/18/2018   HCT 41.6 07/18/2018                         CA Markers No results found for: CEA, CA125, LABCA2                      Note: Lab results reviewed.  Recent Diagnostic Imaging Results  DG Knee 1-2 Views Right CLINICAL DATA:  Knee pain  EXAM: RIGHT KNEE - 1-2 VIEW  COMPARISON:  09/02/2017  FINDINGS: No fracture or malalignment. Marked  arthritis of the medial compartment of the knee with narrowing, spurring and sclerosis. Mild degenerative changes of the lateral and patellofemoral compartments. No large knee effusion.  IMPRESSION: Marked arthritis involving medial compartment of the right knee. No acute osseous abnormality.  Electronically Signed   By: Donavan Foil M.D.   On: 11/19/2017 15:45 DG Knee 1-2 Views Left CLINICAL DATA:  Bilateral knee pain for many years  EXAM: LEFT KNEE - 1-2 VIEW  COMPARISON:  CT 12/31/2013, radiograph 09/02/2013  FINDINGS: Interval left medial hemiarthroplasty with intact appearing hardware. Slight lateral tilt of the femoral prosthetic. Mild degenerative change of the lateral compartment and patellofemoral compartment. Trace knee effusion. Probable hardware defects in the proximal to midshaft of the tibia. Prominent spurring of the tibial spines.  IMPRESSION: 1. No acute osseous abnormality. 2. Interval medial hemiarthroplasty  of the left knee 3. Mild degenerative changes of the lateral and patellofemoral compartments with small knee effusion.  Electronically Signed   By: Donavan Foil M.D.   On: 11/19/2017 15:44 DG HIP UNILAT W OR W/O PELVIS 2-3 VIEWS RIGHT CLINICAL DATA:  Hip pain for many years  EXAM: DG HIP (WITH OR WITHOUT PELVIS) 2-3V RIGHT  COMPARISON:  None.  FINDINGS: No fracture or malalignment. Pubic symphysis and rami are intact. Moderate arthritis of the right hip with joint space narrowing, minimal sclerosis and osteophyte.  IMPRESSION: Moderate arthritis of the right hip.  No acute osseous abnormality.  Electronically Signed   By: Donavan Foil M.D.   On: 11/19/2017 15:40 DG HIP UNILAT W OR W/O PELVIS 2-3 VIEWS LEFT CLINICAL DATA:  Bilateral hip pain for many years  EXAM: DG HIP (WITH OR WITHOUT PELVIS) 2-3V LEFT  COMPARISON:  CT 09/05/2015  FINDINGS: Mild SI joint degenerative change. Pubic symphysis and rami are intact. Moderate  arthritis of the left hip with joint space narrowing. No fracture or malalignment.  IMPRESSION: Moderate arthritis of the left hip. Mild SI joint degenerative changes.  Electronically Signed   By: Donavan Foil M.D.   On: 11/19/2017 15:39 DG Lumbar Spine Complete W/Bend CLINICAL DATA:  Chronic low back pain bilateral hip and knee pain for many years history of rheumatoid arthritis and fibromyalgia  EXAM: LUMBAR SPINE - COMPLETE WITH BENDING VIEWS  COMPARISON:  CT 09/05/2015  FINDINGS: Five non rib bearing lumbar type vertebra. Vertebral body heights are normal. Mild anterior osteophytes of the lower thoracic spine. Mild degenerative changes at L2-L3. No significant change in alignment with flexion or extension.  IMPRESSION: Mild degenerative changes.  No acute osseous abnormality.  Electronically Signed   By: Donavan Foil M.D.   On: 11/19/2017 15:38  Complexity Note: Imaging results reviewed. Results shared with Ms. Cai, using Layman's terms.                         Meds   Current Outpatient Medications:  .  cetirizine (ZYRTEC) 10 MG tablet, TAKE 1 TABLET (10 MG TOTAL) BY MOUTH DAILY. (NOT COVER*), Disp: 90 tablet, Rfl: 3 .  citalopram (CELEXA) 40 MG tablet, TAKE 1 TABLET BY MOUTH EVERY DAY, Disp: 90 tablet, Rfl: 3 .  Dulaglutide (TRULICITY) 1.5 QI/2.9NL SOPN, Inject 1.5 mg into the skin once a week., Disp: 4 pen, Rfl: 5 .  glipiZIDE (GLUCOTROL) 5 MG tablet, TAKE 1 TABLET BY MOUTH TWICE A DAY BEFORE MEALS, Disp: 180 tablet, Rfl: 0 .  glucose blood (ACCU-CHEK AVIVA PLUS) test strip, To check blood sugar once a day.  DX: E11.9, Disp: 100 each, Rfl: 1 .  mirabegron ER (MYRBETRIQ) 50 MG TB24 tablet, Take 1 tablet (50 mg total) by mouth daily., Disp: 30 tablet, Rfl: 11 .  [START ON 10/11/2018] oxyCODONE-acetaminophen (PERCOCET) 10-325 MG tablet, Take 1 tablet by mouth every 8 (eight) hours as needed for pain., Disp: 90 tablet, Rfl: 0 .  Vitamin D, Ergocalciferol, (DRISDOL)  50000 units CAPS capsule, Take 1 capsule (50,000 Units total) by mouth every 7 (seven) days., Disp: 12 capsule, Rfl: 0  ROS  Constitutional: Denies any fever or chills Gastrointestinal: No reported hemesis, hematochezia, vomiting, or acute GI distress Musculoskeletal: Denies any acute onset joint swelling, redness, loss of ROM, or weakness Neurological: No reported episodes of acute onset apraxia, aphasia, dysarthria, agnosia, amnesia, paralysis, loss of coordination, or loss of consciousness  Allergies  Ms. Meller  is allergic to latex; doxycycline; and sulfa antibiotics.  [place]  Drug: Ms. Facchini  reports that she does not use drugs. Alcohol:  reports that she does not drink alcohol. Tobacco:  reports that she quit smoking about 21 years ago. She has a 29.00 pack-year smoking history. She has never used smokeless tobacco. Medical:  has a past medical history of Arthritis, BRCA negative (07/2017), Cancer ([place]), Cellulitis of both lower extremities, CHF (congestive heart failure) ([place]), Coronary artery disease, Diabetes mellitus without complication ([place]), Family history of breast cancer (07/2017), Family history of ovarian cancer, Fibromyalgia, Fibromyalgia affecting shoulder region, Hyperlipidemia, Hypertension, and Spinal stenosis. Surgical: Ms. Zawistowski  has a past surgical history that includes Medial partial knee replacement (Left, 01/06/2014); Vascular Stent (11/04/2013); Anal fissurectomy (01/2004); Hernia repair; Umbilical hernia repair (N/A, 09/05/2015); Ablation saphenous vein w/ RFA; Coronary angioplasty; Dilation and curettage of uterus; Laparoscopic hysterectomy (N/A, 09/03/2017); and Cystoscopy (09/03/2017). Family: family history includes Aneurysm in her father; Breast cancer (age of onset: 76) in her sister; Breast cancer (age of onset: 18) in her sister; COPD in her father and sister; Cataracts in her father and mother; Colon cancer in her paternal uncle and paternal uncle; Dementia in her  father and mother; Diabetes in her father; Fibromyalgia in her sister; Heart attack in her brother; Hypertension in her mother and sister; Melanoma in her father; Migraines in her brother and sister; Ovarian cancer (age of onset: 28) in her sister; Thyroid disease in her mother; Uterine cancer in her cousin and cousin.  Constitutional Exam  General appearance: Well nourished, well developed, and well hydrated. In no apparent acute distress Vitals:   10/09/18 1121  BP: (!) 138/93  Pulse: 83  Temp: 98.2 F (36.8 C)  SpO2: 97%  Weight: (!) 322 lb (146.1 kg)  Height: '5\' 8"'  (1.727 m)  Psych/Mental status: Alert, oriented x 3 (person, place, & time)       Eyes: PERLA Respiratory: No evidence of acute respiratory distress  Cervical Spine Area Exam  Skin & Axial Inspection: No masses, redness, edema, swelling, or associated skin lesions Alignment: Symmetrical Functional ROM: Unrestricted ROM      Stability: No instability detected Muscle Tone/Strength: Functionally intact. No obvious neuro-muscular anomalies detected. Sensory (Neurological): Unimpaired Palpation: No palpable anomalies              Upper Extremity (UE) Exam    Side: Right upper extremity  Side: Left upper extremity  Skin & Extremity Inspection: Skin color, temperature, and hair growth are WNL. No peripheral edema or cyanosis. No masses, redness, swelling, asymmetry, or associated skin lesions. No contractures.  Skin & Extremity Inspection: Ace wrap worn  Functional ROM: Unrestricted ROM          Functional ROM: Unrestricted ROM          Muscle Tone/Strength: Functionally intact. No obvious neuro-muscular anomalies detected.  Muscle Tone/Strength: Functionally intact. No obvious neuro-muscular anomalies detected.  Sensory (Neurological): Unimpaired          Sensory (Neurological): Unimpaired          Palpation: No palpable anomalies              Palpation: No palpable anomalies               Thoracic Spine Area Exam  Skin &  Axial Inspection: No masses, redness, or swelling Alignment: Symmetrical Functional ROM: Unrestricted ROM Stability: No instability detected Muscle Tone/Strength: Functionally intact. No obvious neuro-muscular anomalies detected. Sensory (Neurological): Unimpaired Muscle strength &  Tone: No palpable anomalies  Lumbar Spine Area Exam  Skin & Axial Inspection: No masses, redness, or swelling Alignment: Symmetrical Functional ROM: Unrestricted ROM       Stability: No instability detected Muscle Tone/Strength: Functionally intact. No obvious neuro-muscular anomalies detected. Sensory (Neurological): Unimpaired Palpation: No palpable anomalies       Provocative Tests: Hyperextension/rotation test: deferred today       Lumbar quadrant test (Kemp's test): deferred today       Lateral bending test: deferred today       Patrick's Maneuver: deferred today                   FABER test: deferred today                   S-I anterior distraction/compression test: deferred today         S-I lateral compression test: deferred today         S-I Thigh-thrust test: deferred today         S-I Gaenslen's test: deferred today          Gait & Posture Assessment  Ambulation: Patient ambulates using a cane Gait: Relatively normal for age and body habitus Posture: WNL   Lower Extremity Exam    Side: Right lower extremity  Side: Left lower extremity  Stability: No instability observed          Stability: No instability observed          Skin & Extremity Inspection: Skin color, temperature, and hair growth are WNL. No peripheral edema or cyanosis. No masses, redness, swelling, asymmetry, or associated skin lesions. No contractures.  Skin & Extremity Inspection: Skin color, temperature, and hair growth are WNL. No peripheral edema or cyanosis. No masses, redness, swelling, asymmetry, or associated skin lesions. No contractures.  Functional ROM: Unrestricted ROM                  Functional ROM: Unrestricted  ROM                  Muscle Tone/Strength: Functionally intact. No obvious neuro-muscular anomalies detected.  Muscle Tone/Strength: Functionally intact. No obvious neuro-muscular anomalies detected.  Sensory (Neurological): Unimpaired  Sensory (Neurological): Unimpaired  Palpation: No palpable anomalies  Palpation: No palpable anomalies   Assessment  Primary Diagnosis & Pertinent Problem List: The primary encounter diagnosis was Long term prescription opiate use. Diagnoses of Lumbar spondylosis, Chronic bilateral low back pain with bilateral sciatica, Rheumatoid arthritis involving multiple sites with positive rheumatoid factor (B and E), Chronic pain syndrome, and Marijuana use / CBD oil use  were also pertinent to this visit.  Status Diagnosis  Controlled Controlled Controlled 1. Long term prescription opiate use   2. Lumbar spondylosis   3. Chronic bilateral low back pain with bilateral sciatica   4. Rheumatoid arthritis involving multiple sites with positive rheumatoid factor ([place])   5. Chronic pain syndrome   6. Marijuana use / CBD oil use      Problems updated and reviewed during this visit: Problem  Marijuana use / CBD oil use   Obesity   Plan of Care  Pharmacotherapy (Medications Ordered): Meds ordered this encounter  Medications  . oxyCODONE-acetaminophen (PERCOCET) 10-325 MG tablet    Sig: Take 1 tablet by mouth every 8 (eight) hours as needed for pain.    Dispense:  90 tablet    Refill:  0    Do not place this medication, or any other prescription from our  practice, on "Automatic Refill". Patient may have prescription filled one day early if pharmacy is closed on scheduled refill date.    Order Specific Question:   Supervising Provider    Answer:   Milinda Pointer [[id number]]   New Prescriptions   No medications on file   Medications administered today: [name] "[name]" had no medications administered during this visit. Lab-work, procedure(s), and/or  referral(s): Orders Placed This Encounter  Procedures  . ToxASSURE Select 13 (MW), Urine   Imaging and/or referral(s): None  Interventional therapies: Planned, scheduled, and/or pending:   Not at this time.   Provider-requested follow-up: Return in about 4 weeks (around 11/06/2018) for MedMgmt.  Future Appointments  Date Time Provider [place]  11/05/2018  9:45 AM Vevelyn Francois, NP [facility] None   Primary Care Physician: Virginia Crews, MD Location: [facility] Outpatient Pain Management Facility Note by: Vevelyn Francois NP Date: 10/09/2018; Time: 2:04 PM  Pain Score Disclaimer: We use the NRS-11 scale. This is a self-reported, subjective measurement of pain severity with only modest accuracy. It is used primarily to identify changes within a particular patient. It must be understood that outpatient pain scales are significantly less accurate that those used for research, where they can be applied under ideal controlled circumstances with minimal exposure to variables. In reality, the score is likely to be a combination of pain intensity and pain affect, where pain affect describes the degree of emotional arousal or changes in action readiness caused by the sensory experience of pain. Factors such as social and work situation, setting, emotional state, anxiety levels, expectation, and prior pain experience may influence pain perception and show large inter-individual differences that may also be affected by time variables.  Patient instructions provided during this appointment: Patient Instructions   ____________________________________________________________________________________________  Medication Rules  Applies to: All patients receiving prescriptions (written or electronic).  Pharmacy of record: Pharmacy where electronic prescriptions will be sent. If written prescriptions are taken to a different pharmacy, please inform the nursing staff. The pharmacy listed in the  electronic medical record should be the one where you would like electronic prescriptions to be sent.  Prescription refills: Only during scheduled appointments. Applies to both, written and electronic prescriptions.  NOTE: The following applies primarily to controlled substances (Opioid* Pain Medications).   Patient's responsibilities: 1. Pain Pills: Bring all pain pills to every appointment (except for procedure appointments). 2. Pill Bottles: Bring pills in original pharmacy bottle. Always bring newest bottle. Bring bottle, even if empty. 3. Medication refills: You are responsible for knowing and keeping track of what medications you need refilled. The day before your appointment, write a list of all prescriptions that need to be refilled. Bring that list to your appointment and give it to the admitting nurse. Prescriptions will be written only during appointments. If you forget a medication, it will not be "Called in", "Faxed", or "electronically sent". You will need to get another appointment to get these prescribed. 4. Prescription Accuracy: You are responsible for carefully inspecting your prescriptions before leaving our office. Have the discharge nurse carefully go over each prescription with you, before taking them home. Make sure that your name is accurately spelled, that your address is correct. Check the name and dose of your medication to make sure it is accurate. Check the number of pills, and the written instructions to make sure they are clear and accurate. Make sure that you are given enough medication to last until your next medication refill appointment. 5.  Taking Medication: Take medication as prescribed. Never take more pills than instructed. Never take medication more frequently than prescribed. Taking less pills or less frequently is permitted and encouraged, when it comes to controlled substances (written prescriptions).  6. Inform other Doctors: Always inform, all of your  healthcare providers, of all the medications you take. 7. Pain Medication from other Providers: You are not allowed to accept any additional pain medication from any other Doctor or Healthcare provider. There are two exceptions to this rule. (see below) In the event that you require additional pain medication, you are responsible for notifying us, as stated below. 8. Medication Agreement: You are responsible for carefully reading and following our Medication Agreement. This must be signed before receiving any prescriptions from our practice. Safely store a copy of your signed Agreement. Violations to the Agreement will result in no further prescriptions. (Additional copies of our Medication Agreement are available upon request.) 9. Laws, Rules, & Regulations: All patients are expected to follow all Federal and [employer], [employer], Rules, [employer]. Ignorance of the Laws does not constitute a valid excuse. The use of any illegal substances is prohibited. 10. Adopted CDC guidelines & recommendations: Target dosing levels will be at or below 60 MME/day. Use of benzodiazepines** is not recommended.  Exceptions: There are only two exceptions to the rule of not receiving pain medications from other Healthcare Providers. 1. Exception #1 (Emergencies): In the event of an emergency (i.e.: accident requiring emergency care), you are allowed to receive additional pain medication. However, you are responsible for: As soon as you are able, call our office [phone], at any time of the day or night, and leave a message stating your name, the date and nature of the emergency, and the name and dose of the medication prescribed. In the event that your call is answered by a member of our staff, make sure to document and save the date, time, and the name of the person that took your information.  2. Exception #2 (Planned Surgery): In the event that you are scheduled by another doctor or dentist to have any type of  surgery or procedure, you are allowed (for a period no longer than 30 days), to receive additional pain medication, for the acute post-op pain. However, in this case, you are responsible for picking up a copy of our "Post-op Pain Management for Surgeons" handout, and giving it to your surgeon or dentist. This document is available at our office, and does not require an appointment to obtain it. Simply go to our office during business hours (Monday-Thursday from 8:00 AM to 4:00 PM) (Friday 8:00 AM to 12:00 Noon) or if you have a scheduled appointment with [name], prior to your surgery, and ask for it by name. In addition, you will need to provide [name] with your name, name of your surgeon, type of surgery, and date of procedure or surgery.  *Opioid medications include: morphine, codeine, oxycodone, oxymorphone, hydrocodone, hydromorphone, meperidine, tramadol, tapentadol, buprenorphine, fentanyl, methadone. **Benzodiazepine medications include: diazepam (Valium), alprazolam (Xanax), clonazepam (Klonopine), lorazepam (Ativan), clorazepate (Tranxene), chlordiazepoxide (Librium), estazolam (Prosom), oxazepam (Serax), temazepam (Restoril), triazolam (Halcion) (Last updated: 01/30/2018) ____________________________________________________________________________________________   ____________________________________________________________________________________________  CANNABIDIOL (AKA: CBD Oil or Pills)  Applies to: All patients receiving prescriptions of controlled substances (written and/or electronic).  General Information: Cannabidiol (CBD) was discovered in 2. It is one of some 113 identified cannabinoids in cannabis (Marijuana) plants, accounting for up to 40% of the plant's extract. As of 2018, preliminary clinical research on  cannabidiol included studies of anxiety, cognition, movement disorders, and pain.  Cannabidiol is consummed in multiple ways, including inhalation of cannabis smoke or vapor,  as an aerosol spray into the cheek, and by mouth. It may be supplied as CBD oil containing CBD as the active ingredient (no added tetrahydrocannabinol (THC) or terpenes), a full-plant CBD-dominant hemp extract oil, capsules, dried cannabis, or as a liquid solution. CBD is thought not have the same psychoactivity as THC, and may affect the actions of THC. Studies suggest that CBD may interact with different biological targets, including cannabinoid receptors and other neurotransmitter receptors. As of 2018 the mechanism of action for its biological effects has not been determined.  In the [place], cannabidiol has a limited approval by the Food and Drug Administration (FDA) for treatment of only two types of epilepsy disorders. The side effects of long-term use of the drug include somnolence, decreased appetite, diarrhea, fatigue, malaise, weakness, sleeping problems, and others.  CBD remains a Schedule I drug prohibited for any use.  Legality: Some manufacturers ship CBD products nationally, an illegal action which the FDA has not enforced in 2018, with CBD remaining the subject of an FDA investigational new drug evaluation, and is not considered legal as a dietary supplement or food ingredient as of December 2018. Federal illegality has made it difficult historically to conduct research on CBD. CBD is openly sold in head shops and health food stores in some states where such sales have not been explicitly legalized.  Warning: Because it is not FDA approved for general use or treatment of pain, it is not required to undergo the same manufacturing controls as prescription drugs.  This means that the available cannabidiol (CBD) may be contaminated with THC.  If this is the case, it will trigger a positive urine drug screen (UDS) test for cannabinoids (Marijuana).  Because a positive UDS for illicit substances is a violation of our medication agreement, your opioid analgesics (pain medicine) may be  permanently discontinued. (Last update: 02/20/2018) ____________________________________________________________________________________________   BMI Assessment: Estimated body mass index is 48.96 kg/m as calculated from the following:   Height as of this encounter: '5\' 8"'  (1.727 m).   Weight as of this encounter: 322 lb (146.1 kg).  BMI interpretation table: BMI level Category Range association with higher incidence of chronic pain  <18 kg/m2 Underweight   18.5-24.9 kg/m2 Ideal body weight   25-29.9 kg/m2 Overweight Increased incidence by 20%  30-34.9 kg/m2 Obese (Class I) Increased incidence by 68%  35-39.9 kg/m2 Severe obesity (Class II) Increased incidence by 136%  >40 kg/m2 Extreme obesity (Class [name]) Increased incidence by 254%   Patient's current BMI Ideal Body weight  Body mass index is 48.96 kg/m. Ideal body weight: 63.9 kg (140 lb 14 oz) Adjusted ideal body weight: 96.8 kg (213 lb 5.2 oz)   BMI Readings from Last 4 Encounters:  10/09/18 48.96 kg/m  08/11/18 50.63 kg/m  07/09/18 50.33 kg/m  07/08/18 49.87 kg/m   Wt Readings from Last 4 Encounters:  10/09/18 (!) 322 lb (146.1 kg)  08/11/18 (!) 333 lb (151 kg)  07/09/18 (!) 331 lb (150.1 kg)  07/08/18 (!) 328 lb (148.8 kg)

## 2018-10-09 NOTE — Progress Notes (Signed)
Nursing Pain Medication Assessment:  Safety precautions to be maintained throughout the outpatient stay will include: orient to surroundings, keep bed in low position, maintain call bell within reach at all times, provide assistance with transfer out of bed and ambulation.  Medication Inspection Compliance: Pill count conducted under aseptic conditions, in front of the patient. Neither the pills nor the bottle was removed from the patient's sight at any time. Once count was completed pills were immediately returned to the patient in their original bottle.  Medication: Oxycodone/APAP Pill/Patch Count: 7 of 90 pills remain Pill/Patch Appearance: Markings consistent with prescribed medication Bottle Appearance: Standard pharmacy container. Clearly labeled. Filled Date: 10 / 10 / 2018 Last Medication intake:  Today

## 2018-10-14 LAB — TOXASSURE SELECT 13 (MW), URINE

## 2018-10-18 ENCOUNTER — Other Ambulatory Visit: Payer: Self-pay | Admitting: Family Medicine

## 2018-10-18 DIAGNOSIS — E559 Vitamin D deficiency, unspecified: Secondary | ICD-10-CM

## 2018-10-20 NOTE — Progress Notes (Unsigned)
NO SHOW

## 2018-10-29 NOTE — Progress Notes (Signed)
Admitted to CBD oil use for [facility] which she has discontinued, this is consistent with the level dropping. However never indicated any use of Norco PMP is negative for Norco . This is the second infraction against pain contract

## 2018-11-03 ENCOUNTER — Other Ambulatory Visit: Payer: Self-pay | Admitting: Family Medicine

## 2018-11-05 ENCOUNTER — Encounter: Payer: Self-pay | Admitting: Nurse Practitioner

## 2018-11-05 ENCOUNTER — Other Ambulatory Visit: Payer: Self-pay

## 2018-11-05 ENCOUNTER — Ambulatory Visit: Payer: Medicare HMO | Attending: Nurse Practitioner | Admitting: Nurse Practitioner

## 2018-11-05 VITALS — BP 155/97 | HR 96 | Temp 97.7°F | Resp 18 | Ht 68.5 in | Wt 320.0 lb

## 2018-11-05 DIAGNOSIS — E7800 Pure hypercholesterolemia, unspecified: Secondary | ICD-10-CM | POA: Insufficient documentation

## 2018-11-05 DIAGNOSIS — M5442 Lumbago with sciatica, left side: Secondary | ICD-10-CM | POA: Insufficient documentation

## 2018-11-05 DIAGNOSIS — M79605 Pain in left leg: Secondary | ICD-10-CM

## 2018-11-05 DIAGNOSIS — M797 Fibromyalgia: Secondary | ICD-10-CM | POA: Insufficient documentation

## 2018-11-05 DIAGNOSIS — Z96652 Presence of left artificial knee joint: Secondary | ICD-10-CM | POA: Insufficient documentation

## 2018-11-05 DIAGNOSIS — I890 Lymphedema, not elsewhere classified: Secondary | ICD-10-CM | POA: Insufficient documentation

## 2018-11-05 DIAGNOSIS — L03116 Cellulitis of left lower limb: Secondary | ICD-10-CM | POA: Diagnosis not present

## 2018-11-05 DIAGNOSIS — I878 Other specified disorders of veins: Secondary | ICD-10-CM | POA: Diagnosis not present

## 2018-11-05 DIAGNOSIS — M47816 Spondylosis without myelopathy or radiculopathy, lumbar region: Secondary | ICD-10-CM

## 2018-11-05 DIAGNOSIS — G894 Chronic pain syndrome: Secondary | ICD-10-CM | POA: Diagnosis not present

## 2018-11-05 DIAGNOSIS — Z5181 Encounter for therapeutic drug level monitoring: Secondary | ICD-10-CM | POA: Insufficient documentation

## 2018-11-05 DIAGNOSIS — Z882 Allergy status to sulfonamides status: Secondary | ICD-10-CM | POA: Insufficient documentation

## 2018-11-05 DIAGNOSIS — M170 Bilateral primary osteoarthritis of knee: Secondary | ICD-10-CM | POA: Diagnosis not present

## 2018-11-05 DIAGNOSIS — G63 Polyneuropathy in diseases classified elsewhere: Secondary | ICD-10-CM | POA: Diagnosis not present

## 2018-11-05 DIAGNOSIS — Z9119 Patient's noncompliance with other medical treatment and regimen: Secondary | ICD-10-CM | POA: Insufficient documentation

## 2018-11-05 DIAGNOSIS — I5022 Chronic systolic (congestive) heart failure: Secondary | ICD-10-CM | POA: Insufficient documentation

## 2018-11-05 DIAGNOSIS — I110 Hypertensive heart disease with heart failure: Secondary | ICD-10-CM | POA: Insufficient documentation

## 2018-11-05 DIAGNOSIS — Z9114 Patient's other noncompliance with medication regimen: Secondary | ICD-10-CM | POA: Diagnosis not present

## 2018-11-05 DIAGNOSIS — E669 Obesity, unspecified: Secondary | ICD-10-CM | POA: Diagnosis not present

## 2018-11-05 DIAGNOSIS — G8929 Other chronic pain: Secondary | ICD-10-CM | POA: Diagnosis not present

## 2018-11-05 DIAGNOSIS — L97302 Non-pressure chronic ulcer of unspecified ankle with fat layer exposed: Secondary | ICD-10-CM | POA: Diagnosis not present

## 2018-11-05 DIAGNOSIS — R32 Unspecified urinary incontinence: Secondary | ICD-10-CM | POA: Insufficient documentation

## 2018-11-05 DIAGNOSIS — M25532 Pain in left wrist: Secondary | ICD-10-CM | POA: Insufficient documentation

## 2018-11-05 DIAGNOSIS — N8502 Endometrial intraepithelial neoplasia [EIN]: Secondary | ICD-10-CM | POA: Insufficient documentation

## 2018-11-05 DIAGNOSIS — M069 Rheumatoid arthritis, unspecified: Secondary | ICD-10-CM | POA: Insufficient documentation

## 2018-11-05 DIAGNOSIS — Z6841 Body Mass Index (BMI) 40.0 and over, adult: Secondary | ICD-10-CM | POA: Diagnosis not present

## 2018-11-05 DIAGNOSIS — Z7984 Long term (current) use of oral hypoglycemic drugs: Secondary | ICD-10-CM | POA: Insufficient documentation

## 2018-11-05 DIAGNOSIS — I2510 Atherosclerotic heart disease of native coronary artery without angina pectoris: Secondary | ICD-10-CM | POA: Insufficient documentation

## 2018-11-05 DIAGNOSIS — Z87891 Personal history of nicotine dependence: Secondary | ICD-10-CM | POA: Insufficient documentation

## 2018-11-05 DIAGNOSIS — B9689 Other specified bacterial agents as the cause of diseases classified elsewhere: Secondary | ICD-10-CM | POA: Insufficient documentation

## 2018-11-05 DIAGNOSIS — I83003 Varicose veins of unspecified lower extremity with ulcer of ankle: Secondary | ICD-10-CM | POA: Diagnosis not present

## 2018-11-05 DIAGNOSIS — M5441 Lumbago with sciatica, right side: Secondary | ICD-10-CM | POA: Diagnosis not present

## 2018-11-05 DIAGNOSIS — E1142 Type 2 diabetes mellitus with diabetic polyneuropathy: Secondary | ICD-10-CM | POA: Diagnosis not present

## 2018-11-05 DIAGNOSIS — I872 Venous insufficiency (chronic) (peripheral): Secondary | ICD-10-CM | POA: Insufficient documentation

## 2018-11-05 DIAGNOSIS — L03115 Cellulitis of right lower limb: Secondary | ICD-10-CM | POA: Diagnosis not present

## 2018-11-05 DIAGNOSIS — M79604 Pain in right leg: Secondary | ICD-10-CM | POA: Diagnosis not present

## 2018-11-05 DIAGNOSIS — R531 Weakness: Secondary | ICD-10-CM | POA: Insufficient documentation

## 2018-11-05 DIAGNOSIS — Z79891 Long term (current) use of opiate analgesic: Secondary | ICD-10-CM | POA: Insufficient documentation

## 2018-11-05 DIAGNOSIS — Z881 Allergy status to other antibiotic agents status: Secondary | ICD-10-CM | POA: Insufficient documentation

## 2018-11-05 DIAGNOSIS — M1612 Unilateral primary osteoarthritis, left hip: Secondary | ICD-10-CM | POA: Insufficient documentation

## 2018-11-05 DIAGNOSIS — F329 Major depressive disorder, single episode, unspecified: Secondary | ICD-10-CM | POA: Insufficient documentation

## 2018-11-05 DIAGNOSIS — Z79899 Other long term (current) drug therapy: Secondary | ICD-10-CM | POA: Insufficient documentation

## 2018-11-05 DIAGNOSIS — F1290 Cannabis use, unspecified, uncomplicated: Secondary | ICD-10-CM | POA: Insufficient documentation

## 2018-11-05 DIAGNOSIS — Z833 Family history of diabetes mellitus: Secondary | ICD-10-CM | POA: Insufficient documentation

## 2018-11-05 DIAGNOSIS — Z8249 Family history of ischemic heart disease and other diseases of the circulatory system: Secondary | ICD-10-CM | POA: Insufficient documentation

## 2018-11-05 DIAGNOSIS — Z8041 Family history of malignant neoplasm of ovary: Secondary | ICD-10-CM | POA: Insufficient documentation

## 2018-11-05 MED ORDER — PREGABALIN 50 MG PO CAPS: 50.0000 mg | ORAL_CAPSULE | Freq: Three times a day (TID) | ORAL | 0 refills | Status: DC

## 2018-11-05 MED ORDER — OXYCODONE-ACETAMINOPHEN 10-325 MG PO TABS: ORAL_TABLET | ORAL | 0 refills | Status: AC

## 2018-11-05 NOTE — Progress Notes (Signed)
Patient's Name: [name] [name]  MRN: [id number]  Referring Provider: Virginia Crews, MD  DOB: 13-Jun-1951  PCP: Virginia Crews, MD  DOS: 11/05/2018  Note by: Vevelyn Francois NP  Service setting: Ambulatory outpatient  Specialty: Interventional Pain Management  Location: [facility] (AMB) Pain Management Facility    Patient type: Established    Primary Reason(s) for Visit: Encounter for prescription drug management. (Level of risk: moderate)  CC: Knee Pain (bilaterally); Leg Pain (bilaterally); and Wrist Pain (left; feels sprained; cannot hold more than 1# in left hand)  HPI  [name] [name] is a 67 y.o. year old, female patient, who comes today for a medication management evaluation. She has Allergic rhinitis; Chronic venous insufficiency; Atherosclerosis of coronary artery; Clinical depression; Diabetes ([place]); Migraines; Venous ulcer of ankle ([place]); Gastroduodenal ulcer; Venous stasis; Rheumatoid arthritis ([place]); Non-ST elevation myocardial infarction (NSTEMI), subendocardial infarction, subsequent episode of care [facility]); Chronic systolic heart failure ([place]); Pure hypercholesterolemia; HTN (hypertension); Lymphedema; Right shoulder pain; Encounter for long-term (current) use of other medications; Atypical endometrial hyperplasia; Chronic pain disorder; Complex endometrial hyperplasia with atypia; Peripheral neuropathy; Urinary incontinence; Chronic systolic CHF (congestive heart failure), NYHA class 3 ([place]); Osteoarthritis; Coronary artery disease; Obesity, unspecified; Peptic ulcer disease; Lumbar spondylosis; Chronic bilateral low back pain with bilateral sciatica; Chronic pain of both lower extremities; Chronic pain syndrome; Long term current use of opiate analgesic; General weakness; Marijuana use / CBD oil use ; Obesity; and Controlled substance agreement broken on their problem list. Her primarily concern today is the Knee Pain (bilaterally); Leg Pain (bilaterally); and Wrist Pain (left; feels  sprained; cannot hold more than 1# in left hand)  Pain Assessment: Location: Right, Left Knee Radiating: down to both feet including 2 middle toes on right foot Onset: More than a month ago Duration: Chronic pain, Neuropathic pain Quality: Burning, Constant(stinging pain, feel like legs are "on fire", toes sometimes feel numb) Severity: 5 /10 (subjective, self-reported pain score)  Note: Reported level is compatible with observation.                          Effect on ADL: limits daily activities Timing: Constant Modifying factors: "meds help for a short period of time" BP: (!) 155/97  HR: 96  [name] [name] was last scheduled for an appointment on 10/09/2018 for medication management. During today's appointment we reviewed [name] [name] chronic pain status, as well as her outpatient medication regimen.  She had mentioned to the nurse that she was not sure why she was in for this appointment.  She thought that she would be getting treatment with interventional therapy.  She is having increased peripheral neuropathy.  She admits that this has superseded her knee pain.  She stopped the gabapentin because she felt like it was causing her additional pain.  She denies ever trying Lyrica.  She is willing to try anything to help with this pain.   The patient  reports that she does not use drugs. Her body mass index is 47.95 kg/m.  Further details on both, my assessment(s), as well as the proposed treatment plan, please see below.  Controlled Substance Pharmacotherapy Assessment REMS (Risk Evaluation and Mitigation Strategy)  Analgesic: Oxycodone 10/325 tapering dose to discontinue MME/day: 45-27m/day.  WRise Patience RN  11/05/2018  9:58 AM  Sign at close encounter Nursing Pain Medication Assessment:  Safety precautions to be maintained throughout the outpatient stay will include: orient to surroundings, keep bed in low position, maintain call  bell within reach at all times, provide assistance  with transfer out of bed and ambulation.  Medication Inspection Compliance: Pill count conducted under aseptic conditions, in front of the patient. Neither the pills nor the bottle was removed from the patient's sight at any time. Once count was completed pills were immediately returned to the patient in their original bottle.  Medication: Oxycodone/APAP Pill/Patch Count: 12 of 90 pills remain Pill/Patch Appearance: Markings consistent with prescribed medication Bottle Appearance: Standard pharmacy container. Clearly labeled. Filled Date: 11 / 09 / 2019 Last Medication intake:  Today   Pharmacokinetics: Liberation and absorption (onset of action): WNL Distribution (time to peak effect): WNL Metabolism and excretion (duration of action): WNL         Pharmacodynamics: Desired effects: Analgesia: [name] [name] reports >50% benefit. Functional ability: Patient reports that medication allows her to accomplish basic ADLs Clinically meaningful improvement in function (CMIF): Sustained CMIF goals met Perceived effectiveness: Described as relatively effective, allowing for increase in activities of daily living (ADL) Undesirable effects: Side-effects or Adverse reactions: None reported Monitoring: [place] PMP: Online review of the past 83-monthperiod conducted. Compliant with practice rules and regulations Last UDS on record: Summary  Date Value Ref Range Status  10/09/2018 FINAL  Final    Comment:    ==================================================================== TOXASSURE SELECT 13 (MW) ==================================================================== Test                             Result       Flag       Units Drug Present and Declared for Prescription Verification   Oxycodone                      2102         EXPECTED   ng/mg creat   Oxymorphone                    257          EXPECTED   ng/mg creat   Noroxycodone                   5178         EXPECTED   ng/mg creat    Noroxymorphone                 111          EXPECTED   ng/mg creat    Sources of oxycodone are scheduled prescription medications.    Oxymorphone, noroxycodone, and noroxymorphone are expected    metabolites of oxycodone. Oxymorphone is also available as a    scheduled prescription medication. Drug Present not Declared for Prescription Verification   Carboxy-THC                    8            UNEXPECTED ng/mg creat    Carboxy-THC is a metabolite of tetrahydrocannabinol  (THC).    [facility] most commonly illicit, but THC is also present    in a scheduled prescription medication.   Hydrocodone                    3410         UNEXPECTED ng/mg creat   Hydromorphone                  89  UNEXPECTED ng/mg creat   Dihydrocodeine                 599          UNEXPECTED ng/mg creat   Norhydrocodone                 >2660        UNEXPECTED ng/mg creat    Sources of hydrocodone include scheduled prescription    medications. Hydromorphone, dihydrocodeine and norhydrocodone are    expected metabolites of hydrocodone. Hydromorphone and    dihydrocodeine are also available as scheduled prescription    medications. ==================================================================== Test                      Result    Flag   Units      Ref Range   Creatinine              188              mg/dL      >=20 ==================================================================== Declared Medications:  The flagging and interpretation on this report are based on the  following declared medications.  Unexpected results may arise from  inaccuracies in the declared medications.  **Note: The testing scope of this panel includes these medications:  Oxycodone (Oxycodone Acetaminophen)  **Note: The testing scope of this panel does not include following  reported medications:  Acetaminophen (Oxycodone Acetaminophen)  Cetirizine  Citalopram  Dulaglutide  Glipizide  Mirabegron  Vitamin D2  (Ergocalciferol) ==================================================================== For clinical consultation, please call [phone]. ====================================================================    UDS interpretation: Non-Compliant The patient was given a final warning about the accuracy of reporting medications. Medication Assessment Form: Reviewed. Abnormalities discussed Treatment compliance: Recurrent non-compliance, despite repeated warnings Risk Assessment Profile: Aberrant behavior: non-adherence to medication regimen, non-compliance with practice rules and regulations, obtaining controlled substances from inappropriate sources, unsafe use of medication, use of illicit substances and use of someone else's medication Comorbid factors increasing risk of overdose: See prior notes. No additional risks detected today Opioid risk tool (ORT) (Total Score): 1 Personal History of Substance Abuse (SUD-Substance use disorder):  Alcohol: Negative  Illegal Drugs: Negative  Rx Drugs: Negative  ORT Risk Level calculation: Low Risk Risk of substance use disorder (SUD): High-to-Very High Opioid Risk Tool - 11/05/18 0955      Family History of Substance Abuse   Alcohol  Negative    Illegal Drugs  Negative    Rx Drugs  Negative      Personal History of Substance Abuse   Alcohol  Negative    Illegal Drugs  Negative    Rx Drugs  Negative      Age   Age between 33-45 years   No      History of Preadolescent Sexual Abuse   History of Preadolescent Sexual Abuse  Negative or Female      Psychological Disease   Psychological Disease  Negative    Depression  Positive      Total Score   Opioid Risk Tool Scoring  1    Opioid Risk Interpretation  Low Risk      ORT Scoring interpretation table:  Score <3 = Low Risk for SUD  Score between 4-7 = Moderate Risk for SUD  Score >8 = High Risk for Opioid Abuse   Risk Mitigation Strategies:  Patient Counseling:  Covered Patient-Prescriber Agreement (PPA): Present and active  Notification to other healthcare providers: Done  Pharmacologic Plan: No change in therapy,  at this time.             Laboratory Chemistry  Inflammation Markers (CRP: Acute Phase) (ESR: Chronic Phase) Lab Results  Component Value Date   LATICACIDVEN 1.4 09/05/2015                         Rheumatology Markers No results found for: RF, ANA, LABURIC, URICUR, LYMEIGGIGMAB, LYMEABIGMQN, HLAB27                      Renal Function Markers Lab Results  Component Value Date   BUN 11 07/18/2018   CREATININE 0.62 07/18/2018   BCR 18 07/18/2018   GFRAA 110 07/18/2018   GFRNONAA 96 07/18/2018                             Hepatic Function Markers Lab Results  Component Value Date   AST 14 07/18/2018   ALT 8 07/18/2018   ALBUMIN 3.8 07/18/2018   ALKPHOS 77 07/18/2018   LIPASE 20 (L) 09/04/2015                        Electrolytes Lab Results  Component Value Date   NA 136 07/18/2018   K 4.9 07/18/2018   CL 96 07/18/2018   CALCIUM 9.3 07/18/2018                        Neuropathy Markers Lab Results  Component Value Date   VITAMINB12 326 07/18/2018   HGBA1C 10.1 (A) 07/09/2018   HIV Non Reactive 07/18/2017                        CNS Tests No results found for: COLORCSF, APPEARCSF, RBCCOUNTCSF, WBCCSF, POLYSCSF, LYMPHSCSF, EOSCSF, PROTEINCSF, GLUCCSF, JCVIRUS, CSFOLI, IGGCSF                      Bone Pathology Markers Lab Results  Component Value Date   VD25OH 16.5 (L) 07/18/2018                         Coagulation Parameters Lab Results  Component Value Date   INR 0.97 08/26/2017   LABPROT 12.8 08/26/2017   APTT 24 08/26/2017   PLT 256 07/18/2018                        Cardiovascular Markers Lab Results  Component Value Date   CKTOTAL 62 11/04/2013   CKMB 1.2 11/04/2013   TROPONINI 1.00 (H) 09/05/2015   HGB 13.4 07/18/2018   HCT 41.6 07/18/2018                         CA Markers No results  found for: CEA, CA125, LABCA2                      Note: Lab results reviewed.  Recent Diagnostic Imaging Results  DG Knee 1-2 Views Right CLINICAL DATA:  Knee pain  EXAM: RIGHT KNEE - 1-2 VIEW  COMPARISON:  09/02/2017  FINDINGS: No fracture or malalignment. Marked arthritis of the medial compartment of the knee with narrowing, spurring and sclerosis. Mild degenerative changes of the lateral and patellofemoral compartments. No large knee effusion.  IMPRESSION: Marked arthritis involving  medial compartment of the right knee. No acute osseous abnormality.  Electronically Signed   By: Donavan Foil M.D.   On: 11/19/2017 15:45 DG Knee 1-2 Views Left CLINICAL DATA:  Bilateral knee pain for many years  EXAM: LEFT KNEE - 1-2 VIEW  COMPARISON:  CT 12/31/2013, radiograph 09/02/2013  FINDINGS: Interval left medial hemiarthroplasty with intact appearing hardware. Slight lateral tilt of the femoral prosthetic. Mild degenerative change of the lateral compartment and patellofemoral compartment. Trace knee effusion. Probable hardware defects in the proximal to midshaft of the tibia. Prominent spurring of the tibial spines.  IMPRESSION: 1. No acute osseous abnormality. 2. Interval medial hemiarthroplasty of the left knee 3. Mild degenerative changes of the lateral and patellofemoral compartments with small knee effusion.  Electronically Signed   By: Donavan Foil M.D.   On: 11/19/2017 15:44 DG HIP UNILAT W OR W/O PELVIS 2-3 VIEWS RIGHT CLINICAL DATA:  Hip pain for many years  EXAM: DG HIP (WITH OR WITHOUT PELVIS) 2-3V RIGHT  COMPARISON:  None.  FINDINGS: No fracture or malalignment. Pubic symphysis and rami are intact. Moderate arthritis of the right hip with joint space narrowing, minimal sclerosis and osteophyte.  IMPRESSION: Moderate arthritis of the right hip.  No acute osseous abnormality.  Electronically Signed   By: Donavan Foil M.D.   On: 11/19/2017  15:40 DG HIP UNILAT W OR W/O PELVIS 2-3 VIEWS LEFT CLINICAL DATA:  Bilateral hip pain for many years  EXAM: DG HIP (WITH OR WITHOUT PELVIS) 2-3V LEFT  COMPARISON:  CT 09/05/2015  FINDINGS: Mild SI joint degenerative change. Pubic symphysis and rami are intact. Moderate arthritis of the left hip with joint space narrowing. No fracture or malalignment.  IMPRESSION: Moderate arthritis of the left hip. Mild SI joint degenerative changes.  Electronically Signed   By: Donavan Foil M.D.   On: 11/19/2017 15:39 DG Lumbar Spine Complete W/Bend CLINICAL DATA:  Chronic low back pain bilateral hip and knee pain for many years history of rheumatoid arthritis and fibromyalgia  EXAM: LUMBAR SPINE - COMPLETE WITH BENDING VIEWS  COMPARISON:  CT 09/05/2015  FINDINGS: Five non rib bearing lumbar type vertebra. Vertebral body heights are normal. Mild anterior osteophytes of the lower thoracic spine. Mild degenerative changes at L2-L3. No significant change in alignment with flexion or extension.  IMPRESSION: Mild degenerative changes.  No acute osseous abnormality.  Electronically Signed   By: Donavan Foil M.D.   On: 11/19/2017 15:38  Complexity Note: Imaging results reviewed. Results shared with [name] [name], using Layman's terms.                         Meds   Current Outpatient Medications:  .  cetirizine (ZYRTEC) 10 MG tablet, TAKE 1 TABLET (10 MG TOTAL) BY MOUTH DAILY. (NOT COVER*), Disp: 90 tablet, Rfl: 3 .  citalopram (CELEXA) 40 MG tablet, TAKE 1 TABLET BY MOUTH EVERY DAY, Disp: 90 tablet, Rfl: 3 .  Dulaglutide (TRULICITY) 1.5 IY/6.4BR SOPN, Inject 1.5 mg into the skin once a week., Disp: 4 pen, Rfl: 5 .  glipiZIDE (GLUCOTROL) 5 MG tablet, TAKE 1 TABLET BY MOUTH TWICE A DAY BEFORE MEALS, Disp: 180 tablet, Rfl: 0 .  glucose blood (ACCU-CHEK AVIVA PLUS) test strip, To check blood sugar once a day.  DX: E11.9, Disp: 100 each, Rfl: 1 .  mirabegron ER (MYRBETRIQ) 50 MG TB24  tablet, Take 1 tablet (50 mg total) by mouth daily., Disp: 30 tablet, Rfl: 11 .  oxyCODONE-acetaminophen (PERCOCET) 10-325 MG tablet, Take 1 tablet by mouth every 8 (eight) hours for 7 days, THEN 1 tablet every 12 (twelve) hours for 7 days, THEN 1 tablet daily for 7 days., Disp: 42 tablet, Rfl: 0 .  Vitamin D, Ergocalciferol, (DRISDOL) 1.25 MG (50000 UT) CAPS capsule, TAKE 1 CAPSULE (50,000 UNITS TOTAL) BY MOUTH EVERY 7 (SEVEN) DAYS., Disp: 12 capsule, Rfl: 0 .  pregabalin (LYRICA) 50 MG capsule, Take 1 capsule (50 mg total) by mouth 3 (three) times daily., Disp: 90 capsule, Rfl: 0  ROS  Constitutional: Denies any fever or chills Gastrointestinal: No reported hemesis, hematochezia, vomiting, or acute GI distress Musculoskeletal: Denies any acute onset joint swelling, redness, loss of ROM, or weakness Neurological: No reported episodes of acute onset apraxia, aphasia, dysarthria, agnosia, amnesia, paralysis, loss of coordination, or loss of consciousness  Allergies  [name] [name] is allergic to latex; doxycycline; and sulfa antibiotics.  [place]  Drug: [name] [name]  reports that she does not use drugs. Alcohol:  reports that she does not drink alcohol. Tobacco:  reports that she quit smoking about 21 years ago. She has a 29.00 pack-year smoking history. She has never used smokeless tobacco. Medical:  has a past medical history of Arthritis, BRCA negative (07/2017), Cancer ([place]), Cellulitis of both lower extremities, CHF (congestive heart failure) ([place]), Coronary artery disease, Diabetes mellitus without complication ([place]), Family history of breast cancer (07/2017), Family history of ovarian cancer, Fibromyalgia, Fibromyalgia affecting shoulder region, Hyperlipidemia, Hypertension, and Spinal stenosis. Surgical: [name] [name]  has a past surgical history that includes Medial partial knee replacement (Left, 01/06/2014); Vascular Stent (11/04/2013); Anal fissurectomy (01/2004); Hernia repair; Umbilical hernia  repair (N/A, 09/05/2015); Ablation saphenous vein w/ RFA; Coronary angioplasty; Dilation and curettage of uterus; Laparoscopic hysterectomy (N/A, 09/03/2017); and Cystoscopy (09/03/2017). Family: family history includes Aneurysm in her father; Breast cancer (age of onset: 52) in her sister; Breast cancer (age of onset: 20) in her sister; COPD in her father and sister; Cataracts in her father and mother; Colon cancer in her paternal uncle and paternal uncle; Dementia in her father and mother; Diabetes in her father; Fibromyalgia in her sister; Heart attack in her brother; Hypertension in her mother and sister; Melanoma in her father; Migraines in her brother and sister; Ovarian cancer (age of onset: 21) in her sister; Thyroid disease in her mother; Uterine cancer in her cousin and cousin.  Constitutional Exam  General appearance: Well nourished, well developed, and well hydrated. In no apparent acute distress Vitals:   11/05/18 0942  BP: (!) 155/97  Pulse: 96  Resp: 18  Temp: 97.7 F (36.5 C)  TempSrc: Oral  SpO2: 96%  Weight: (!) 320 lb (145.2 kg)  Height: 5' 8.5" (1.74 m)  Psych/Mental status: Alert, oriented x 3 (person, place, & time)       Eyes: PERLA Respiratory: No evidence of acute respiratory distress   Gait & Posture Assessment  Ambulation: Patient ambulates using a cane Gait: Relatively normal for age and body habitus Posture: WNL   Lower Extremity Exam    Side: Right lower extremity  Side: Left lower extremity  Stability: No instability observed          Stability: No instability observed          Skin & Extremity Inspection: Edema  Skin & Extremity Inspection: Edema  Functional ROM: Unrestricted ROM                  Functional ROM: Unrestricted ROM  Muscle Tone/Strength: Functionally intact. No obvious neuro-muscular anomalies detected.  Muscle Tone/Strength: Functionally intact. No obvious neuro-muscular anomalies detected.  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 Diagnoses of Chronic pain of both lower extremities, Venous stasis ulcer of ankle with fat layer exposed with varicose veins, unspecified laterality ([place]), Lumbar spondylosis, and Controlled substance agreement broken were also pertinent to this visit.  Status Diagnosis  Worsening Worsening Worsening 1. 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 Chronic pain of both lower extremities   3. Venous stasis ulcer of ankle with fat layer exposed with varicose veins, unspecified laterality ([place])   4. Lumbar spondylosis   5. Controlled substance agreement broken     Problems updated and reviewed during this visit: Problem  Controlled Substance Agreement Broken   Illicit substance in UDS Unprescribed medication in UDS Patient placed on a 3-week taper of oxycodone.    Plan of Care  Pharmacotherapy (Medications Ordered): Meds ordered this encounter  Medications  . pregabalin (LYRICA) 50 MG capsule    Sig: Take 1 capsule (50 mg total) by mouth 3 (three) times daily.    Dispense:  90 capsule    Refill:  0    Order Specific Question:   Supervising Provider    Answer:   Milinda Pointer [phone]  . oxyCODONE-acetaminophen (PERCOCET) 10-325 MG tablet    Sig: Take 1 tablet by mouth every 8 (eight) hours for 7 days, THEN 1 tablet every 12 (twelve) hours for 7 days, THEN 1 tablet daily for 7 days.    Dispense:  42 tablet    Refill:  0    Do not place this medication, or any other prescription from our practice, on "Automatic Refill". Patient may have prescription filled one day early if pharmacy is closed on scheduled refill date.    Order Specific Question:   Supervising Provider    Answer:   Milinda Pointer [phone]   New Prescriptions    PREGABALIN (LYRICA) 50 MG CAPSULE    Take 1 capsule (50 mg total) by mouth 3 (three) times daily.   Medications administered today: [name] "[name]" had no medications administered during this visit. Lab-work, procedure(s), and/or referral(s): No orders of the defined types were placed in this encounter.  Imaging and/or referral(s): None  Interventional therapies: Planned, scheduled, and/or pending:   Not at this time.  Will do a 30-day trial of Lyrica with the intent to increase the dose slowly.  Patient was informed to watch for any swelling which may indicate intolerance and to call our office immediately.  Patient to follow-up with Dr. Holley Raring to increase dose and to discuss additional treatment options secondary to no opioids not being a treatment option   Provider-requested follow-up: Return in about 4 weeks (around 12/03/2018) for MedMgmt, w/ Dr. Holley Raring.  Future Appointments  Date Time Provider [place]  12/08/2018  2:00 PM Gillis Santa, MD [facility] None   Primary Care Physician: Virginia Crews, MD Location: [facility] Outpatient Pain Management Facility Note by: Vevelyn Francois NP Date: 11/05/2018; Time: 7:27 PM  Pain Score Disclaimer: We use the NRS-11 scale. This is a self-reported, subjective measurement of pain severity with only modest accuracy. It is used primarily to identify changes within a particular patient. It must be understood  that outpatient pain scales are significantly less accurate that those used for research, where they can be applied under ideal controlled circumstances with minimal exposure to variables. In reality, the score is likely to be a combination of pain intensity and pain affect, where pain affect describes the degree of emotional arousal or changes in action readiness caused by the sensory experience of pain. Factors such as social and work situation, setting, emotional state, anxiety levels, expectation, and prior pain experience may  influence pain perception and show large inter-individual differences that may also be affected by time variables.  Patient instructions provided during this appointment: Patient Instructions  Oxycodone with acetaminophen to last until 11/25/2018 and Lyrica have been escribed to your pharmacy.

## 2018-11-05 NOTE — Patient Instructions (Signed)
Oxycodone with acetaminophen to last until 11/25/2018 and Lyrica have been escribed to your pharmacy.

## 2018-11-05 NOTE — Progress Notes (Signed)
Nursing Pain Medication Assessment:  Safety precautions to be maintained throughout the outpatient stay will include: orient to surroundings, keep bed in low position, maintain call bell within reach at all times, provide assistance with transfer out of bed and ambulation.  Medication Inspection Compliance: Pill count conducted under aseptic conditions, in front of the patient. Neither the pills nor the bottle was removed from the patient's sight at any time. Once count was completed pills were immediately returned to the patient in their original bottle.  Medication: Oxycodone/APAP Pill/Patch Count: 12 of 90 pills remain Pill/Patch Appearance: Markings consistent with prescribed medication Bottle Appearance: Standard pharmacy container. Clearly labeled. Filled Date: 26 / 09 / 2019 Last Medication intake:  Today

## 2018-12-02 ENCOUNTER — Other Ambulatory Visit: Payer: Self-pay | Admitting: Nurse Practitioner

## 2018-12-08 ENCOUNTER — Encounter: Payer: Medicare HMO | Admitting: Student in an Organized Health Care Education/Training Program

## 2018-12-11 ENCOUNTER — Telehealth: Payer: Self-pay | Admitting: *Deleted

## 2018-12-11 NOTE — Telephone Encounter (Signed)
Informed patient that we do not do refills per pharmacy requests.  Instructed to reschedule no show appointment from 12-08-2018.  Patient states understanding.

## 2018-12-23 ENCOUNTER — Ambulatory Visit: Payer: Medicare HMO | Admitting: Student in an Organized Health Care Education/Training Program

## 2018-12-25 ENCOUNTER — Other Ambulatory Visit: Payer: Self-pay | Admitting: Family Medicine

## 2019-01-02 ENCOUNTER — Other Ambulatory Visit: Payer: Self-pay | Admitting: Family Medicine

## 2019-01-02 DIAGNOSIS — F32A Depression, unspecified: Secondary | ICD-10-CM

## 2019-01-02 DIAGNOSIS — F329 Major depressive disorder, single episode, unspecified: Secondary | ICD-10-CM

## 2019-01-02 DIAGNOSIS — E11628 Type 2 diabetes mellitus with other skin complications: Secondary | ICD-10-CM

## 2019-01-02 DIAGNOSIS — E559 Vitamin D deficiency, unspecified: Secondary | ICD-10-CM

## 2019-01-02 NOTE — Telephone Encounter (Deleted)
e

## 2019-01-02 NOTE — Telephone Encounter (Addendum)
[place] faxed refill request for the following medications:  glipiZIDE (GLUCOTROL) 5 MG tablet  citalopram (CELEXA) 40 MG tablet  Vitamin D, Ergocalciferol, (DRISDOL) 1.25 MG (50000 UT) CAPS capsule  Trulicity  Please advise.

## 2019-01-05 MED ORDER — GLIPIZIDE 5 MG PO TABS: ORAL_TABLET | ORAL | 0 refills | Status: DC

## 2019-01-05 MED ORDER — VITAMIN D (ERGOCALCIFEROL) 1.25 MG (50000 UNIT) PO CAPS: 50000.0000 [IU] | ORAL_CAPSULE | ORAL | 0 refills | Status: DC

## 2019-01-05 MED ORDER — CITALOPRAM HYDROBROMIDE 40 MG PO TABS: 40.0000 mg | ORAL_TABLET | Freq: Every day | ORAL | 3 refills | Status: DC

## 2019-01-05 NOTE — Telephone Encounter (Signed)
[name] [name], She has had her [facility]. Is she switching pharmacies?

## 2019-01-05 NOTE — Telephone Encounter (Signed)
Patient is switching to [facility]

## 2019-01-06 ENCOUNTER — Other Ambulatory Visit: Payer: Self-pay | Admitting: Family Medicine

## 2019-01-06 MED ORDER — DULAGLUTIDE 1.5 MG/0.5ML ~~LOC~~ SOAJ: 1.5000 mg | SUBCUTANEOUS | 3 refills | Status: DC

## 2019-01-06 NOTE — Telephone Encounter (Signed)
[place] faxed refill request for the following medications:  Dulaglutide (TRULICITY) 1.5 FE/0.7HQ SOPN  90 day supply  Last Rx: 07/09/2018 LOV: 07/09/18 Please advise. Thanks TNP

## 2019-01-06 NOTE — Telephone Encounter (Signed)
Patient advised. [facility] states [facility] was going to charge her $600. Sample left for patient to pick up until she receives RX from mail order. She has been out of medication for a week.

## 2019-01-06 NOTE — Telephone Encounter (Signed)
Probably should be sure she got the first prescription at the [facility] [address] (where Dr. B sent the prescription on 12-25-18) because it will take [employer] 2 weeks to get this here. Dr. Jacinto Reap may be planning follow up to be sure the 1.5 mg once a week is enough to control diabetes. May send prescription to [facility].

## 2019-01-09 ENCOUNTER — Telehealth: Payer: Self-pay | Admitting: Family Medicine

## 2019-01-09 DIAGNOSIS — F32A Depression, unspecified: Secondary | ICD-10-CM

## 2019-01-09 DIAGNOSIS — F329 Major depressive disorder, single episode, unspecified: Secondary | ICD-10-CM

## 2019-01-09 NOTE — Telephone Encounter (Signed)
For your review. Please save for Dr. Jacinto Reap.

## 2019-01-09 NOTE — Telephone Encounter (Signed)
[name] [name] with [facility] pharmacy called needing clarification on the patients rx for Citalopram 40 mg.  They need to know the dosage.  They indicate that the medication should not be 40 mg for 60 and older.  Please advise  CB# [phone]   Thanks [name]

## 2019-01-12 ENCOUNTER — Other Ambulatory Visit: Payer: Self-pay | Admitting: Family Medicine

## 2019-01-12 DIAGNOSIS — E559 Vitamin D deficiency, unspecified: Secondary | ICD-10-CM

## 2019-01-12 MED ORDER — CITALOPRAM HYDROBROMIDE 20 MG PO TABS: 20.0000 mg | ORAL_TABLET | Freq: Every day | ORAL | 3 refills | Status: DC

## 2019-01-12 MED ORDER — VITAMIN D (ERGOCALCIFEROL) 1.25 MG (50000 UNIT) PO CAPS: 50000.0000 [IU] | ORAL_CAPSULE | ORAL | 0 refills | Status: DC

## 2019-01-12 NOTE — Telephone Encounter (Signed)
Patient was advised and states that the pharmacy contacted her about the new dose of the medication been filled.

## 2019-01-12 NOTE — Telephone Encounter (Signed)
As patient recently turned 16, her dose of Celexa will need to be decreased.  Max dose is 20 mg/day after age 66. Please let patient know. New Rx sent to pharmacy.

## 2019-01-12 NOTE — Telephone Encounter (Signed)
[place] faxed refill request for the following medications:  Vitamin D2  Please advise. Thanks TNP

## 2019-03-09 ENCOUNTER — Other Ambulatory Visit: Payer: Self-pay | Admitting: Family Medicine

## 2019-03-09 DIAGNOSIS — E559 Vitamin D deficiency, unspecified: Secondary | ICD-10-CM

## 2019-03-10 ENCOUNTER — Other Ambulatory Visit: Payer: Self-pay | Admitting: Family Medicine

## 2019-03-10 DIAGNOSIS — E11628 Type 2 diabetes mellitus with other skin complications: Secondary | ICD-10-CM

## 2019-03-10 NOTE — Telephone Encounter (Signed)
Last OV was 07/2018. Should she schedule a e-visit before refill?

## 2019-03-10 NOTE — Telephone Encounter (Signed)
[place] faxed refill request for the following medications:  glipiZIDE (GLUCOTROL) 5 MG tablet    Please advise.

## 2019-03-11 NOTE — Telephone Encounter (Signed)
Yes she is way overdue for diabetes f/u.  Needs drive up [place] and BP check ahead of time

## 2019-03-11 NOTE — Telephone Encounter (Signed)
LMTCB to schedule e-visit and drive up Z4M

## 2019-03-12 NOTE — Telephone Encounter (Signed)
Please review

## 2019-03-16 MED ORDER — GLIPIZIDE 5 MG PO TABS: ORAL_TABLET | ORAL | 0 refills | Status: DC

## 2019-03-16 NOTE — Telephone Encounter (Signed)
Can we try to call again to get diabetes f/u scheduled? Will go ahead and send refill

## 2019-03-16 NOTE — Telephone Encounter (Signed)
LMTCB

## 2019-03-19 NOTE — Telephone Encounter (Signed)
Patient scheduled for a doxy.me visit on 03/23/2019

## 2019-03-23 ENCOUNTER — Encounter: Payer: Medicare HMO | Admitting: Family Medicine

## 2019-03-23 ENCOUNTER — Encounter: Payer: Self-pay | Admitting: Family Medicine

## 2019-03-23 NOTE — Progress Notes (Signed)
       Patient: ERI MCEVERS Female    DOB: [birth year]   68 y.o.   MRN: [id number] Visit Date: 03/23/2019  Today's Provider: Lavon Paganini, MD   Chief Complaint  Patient presents with  . Diabetes    Patient not available at time of evisit and rescheduled for next week.  Bacigalupo, Dionne Bucy, MD, MPH [facility]

## 2019-03-25 ENCOUNTER — Other Ambulatory Visit: Payer: Self-pay

## 2019-03-25 DIAGNOSIS — E1142 Type 2 diabetes mellitus with diabetic polyneuropathy: Secondary | ICD-10-CM

## 2019-03-25 LAB — POCT GLYCOSYLATED HEMOGLOBIN (HGB A1C): Hemoglobin A1C: 7.8 % — AB (ref 4.0–5.6)

## 2019-03-25 NOTE — Progress Notes (Signed)
This encounter was created in error - please disregard.

## 2019-03-30 ENCOUNTER — Encounter: Payer: Self-pay | Admitting: Family Medicine

## 2019-03-30 ENCOUNTER — Ambulatory Visit (INDEPENDENT_AMBULATORY_CARE_PROVIDER_SITE_OTHER): Payer: Medicare HMO | Admitting: Family Medicine

## 2019-03-30 DIAGNOSIS — G63 Polyneuropathy in diseases classified elsewhere: Secondary | ICD-10-CM | POA: Diagnosis not present

## 2019-03-30 DIAGNOSIS — R531 Weakness: Secondary | ICD-10-CM | POA: Diagnosis not present

## 2019-03-30 DIAGNOSIS — I2510 Atherosclerotic heart disease of native coronary artery without angina pectoris: Secondary | ICD-10-CM | POA: Diagnosis not present

## 2019-03-30 DIAGNOSIS — E1142 Type 2 diabetes mellitus with diabetic polyneuropathy: Secondary | ICD-10-CM | POA: Diagnosis not present

## 2019-03-30 DIAGNOSIS — E7800 Pure hypercholesterolemia, unspecified: Secondary | ICD-10-CM | POA: Diagnosis not present

## 2019-03-30 MED ORDER — EMPAGLIFLOZIN 10 MG PO TABS: 10.0000 mg | ORAL_TABLET | Freq: Every day | ORAL | 0 refills | Status: DC

## 2019-03-30 MED ORDER — ROSUVASTATIN CALCIUM 5 MG PO TABS: 5.0000 mg | ORAL_TABLET | Freq: Every day | ORAL | 0 refills | Status: DC

## 2019-03-30 MED ORDER — CETIRIZINE HCL 10 MG PO TABS: ORAL_TABLET | ORAL | 3 refills | Status: DC

## 2019-03-30 MED ORDER — PREGABALIN 50 MG PO CAPS: 50.0000 mg | ORAL_CAPSULE | Freq: Three times a day (TID) | ORAL | 0 refills | Status: DC

## 2019-03-30 NOTE — Progress Notes (Signed)
Patient: [name] [name] Female    DOB: [birth year]   68 y.o.   MRN: [id number] Visit Date: 03/30/2019  Today's Provider: Lavon Paganini, MD   Chief Complaint  Patient presents with  . Diabetes   Subjective:    Virtual Visit via Video Note  I connected with [name] [name] on 03/30/19 at 10:40 AM EDT by a video enabled telemedicine application and verified that I am speaking with the correct person using two identifiers.   I discussed the limitations of evaluation and management by telemedicine and the availability of in person appointments. The patient expressed understanding and agreed to proceed.   Patient location: home Provider location: home office  Persons involved in the visit: patient, provider  HPI  Diabetes Mellitus Type II, Follow-up:   RecentLabs       Lab Results  Component Value Date   HGBA1C 9.8 03/19/2018   HGBA1C 8.4 (H) 07/18/2017   HGBA1C 7.4 (H) 12/13/2016      Last seen for diabetes on 07/09/2018 Management since then includes increased Trulicity to 1.5 mg weekly. She reports good compliance with treatment. She is not having side effects.  Current symptoms include  paresthesia of the feet, polydipsia, polyuria and have been unchanged. Home blood sugar records: 187 yesterday morning Patient states FBS are fluctuating between 140-180' s  Episodes of hypoglycemia? no              Most Recent Eye Exam: not UTD Weight trend: stable Current diet: no fried foods Current exercise: none  Pertinent Labs: Lab Results  Component Value Date   CHOL 299 (H) 07/18/2018   HDL 41 07/18/2018   LDLCALC 214 (H) 07/18/2018   LDLDIRECT 183.0 12/13/2016   TRIG 222 (H) 07/18/2018   CHOLHDL 7.3 (H) 07/18/2018       Wt Readings from Last 3 Encounters:  07/08/18 (!) 328 lb (148.8 kg)  06/23/18 (!) 334 lb (151.5 kg)  06/02/18 (!) 338 lb 6.4 oz (153.5 kg)    Patient is not on a statin - states she felt terrible on simvastatin in the past.     Allergies  Allergen Reactions  . Latex Itching    itching  . Doxycycline Rash  . Sulfa Antibiotics Itching, Rash and Swelling     Current Outpatient Medications:  .  cetirizine (ZYRTEC) 10 MG tablet, TAKE 1 TABLET (10 MG TOTAL) BY MOUTH DAILY. (NOT COVER*), Disp: 90 tablet, Rfl: 3 .  citalopram (CELEXA) 20 MG tablet, Take 1 tablet (20 mg total) by mouth daily., Disp: 90 tablet, Rfl: 3 .  Dulaglutide 1.5 MG/0.5ML SOPN, Inject 1.5 mg into the skin once a week., Disp: 12 pen, Rfl: 3 .  glipiZIDE (GLUCOTROL) 5 MG tablet, TAKE 1 TABLET BY MOUTH TWICE A DAY BEFORE MEALS, Disp: 180 tablet, Rfl: 0 .  glucose blood (ACCU-CHEK AVIVA PLUS) test strip, To check blood sugar once a day.  DX: E11.9, Disp: 100 each, Rfl: 1 .  Vitamin D, Ergocalciferol, (DRISDOL) 1.25 MG (50000 UT) CAPS capsule, TAKE 1 CAPSULE EVERY 7 DAYS, Disp: 12 capsule, Rfl: 0 .  mirabegron ER (MYRBETRIQ) 50 MG TB24 tablet, Take 1 tablet (50 mg total) by mouth daily. (Patient not taking: Reported on 03/30/2019), Disp: 30 tablet, Rfl: 11 .  pregabalin (LYRICA) 50 MG capsule, Take 1 capsule (50 mg total) by mouth 3 (three) times daily., Disp: 90 capsule, Rfl: 0  Review of Systems  Constitutional: Negative.   Respiratory: Negative.  Cardiovascular: Negative.   Endocrine: Negative.   Musculoskeletal: Negative.     Social History   Tobacco Use  . Smoking status: Former Smoker    Packs/day: 1.00    Years: 29.00    Pack years: 29.00    Last attempt to quit: 12/03/1996    Years since quitting: 22.3  . Smokeless tobacco: Never Used  Substance Use Topics  . Alcohol use: No      Objective:   LMP  (LMP Unknown) Comment: age 72 There were no vitals filed for this visit.   Physical Exam Constitutional:      Appearance: Normal appearance.  Pulmonary:     Effort: Pulmonary effort is normal. No respiratory distress.  Neurological:     Mental Status: She is alert and oriented to person, place, and time.  Psychiatric:         Mood and Affect: Mood normal.        Behavior: Behavior normal.     Results for orders placed or performed in visit on 03/25/19  POCT HgB A1C  Result Value Ref Range   Hemoglobin A1C 7.8 (A) 4.0 - 5.6 %   HbA1c POC (<> result, manual entry)     HbA1c, POC (prediabetic range)     HbA1c, POC (controlled diabetic range)       Lab Results  Component Value Date   CHOL 299 (H) 07/18/2018   HDL 41 07/18/2018   LDLCALC 214 (H) 07/18/2018   LDLDIRECT 183.0 12/13/2016   TRIG 222 (H) 07/18/2018   CHOLHDL 7.3 (H) 07/18/2018       Assessment & Plan    I discussed the assessment and treatment plan with the patient. The patient was provided an opportunity to ask questions and all were answered. The patient agreed with the plan and demonstrated an understanding of the instructions.   The patient was advised to call back or seek an in-person evaluation if the symptoms worsen or if the condition fails to improve as anticipated.  Problem List Items Addressed This Visit      Cardiovascular and Mediastinum   Atherosclerosis of coronary artery    Stable, no angina Discussed goal LDL of <70, and how current LDL is quite elevated Patient very hesistant to take statin Discussed need to decreased heart disease and stroke risk Patient hesistantly agrees to try Crestor - will start 5mg  daily Plan to recheck CMP and Lipids in ~3 months      Relevant Medications   rosuvastatin (CRESTOR) 5 MG tablet     Endocrine   Diabetes ([place]) - Primary    Control improving but not to goal Discussed A1c goal of <7 Will stop glipizide and try Jardiance instead Patient is at risk for hypoglycemic events and complications on sulfonylurea Continue Trulicity weekly F/u in 3 m and repeat A1c      Relevant Medications   rosuvastatin (CRESTOR) 5 MG tablet   empagliflozin (JARDIANCE) 10 MG TABS tablet     Nervous and Auditory   Peripheral neuropathy    Related to diabetes chronci and stable Continue  gabapentin Consider PT after pandemic      Relevant Medications   pregabalin (LYRICA) 50 MG capsule     Other   Pure hypercholesterolemia    As above, patient hesistantly agrees to start Crestor 5mg  daily Take CoQ10 to minimize side effects Recheck CMP and FLP in ~3 months      Relevant Medications   rosuvastatin (CRESTOR) 5 MG tablet   General  weakness    Chronic Related to deconditioning and obesity Discussed need to walk around the house, even though she is staying home and avoiding others due to pandemic Consider PT after pandemic          Return in about 3 months (around 06/29/2019) for diabetes and .   The entirety of the information documented in the History of Present Illness, Review of Systems and Physical Exam were personally obtained by me. Portions of this information were initially documented by Tiburcio Pea, CMA and reviewed by me for thoroughness and accuracy.    Virginia Crews, MD, MPH [facility] 04/02/2019 10:02 AM

## 2019-03-30 NOTE — Patient Instructions (Addendum)
Continue Trulicity weekly  Stop glipizide Start Jardiance 10mg  daily Let me know if you develop yeast infections or UTIs  Start Crestor low dose daily for cholesterol Take CoQ10 with it to minimize side effects  Let us know how the new medicines are going in about 2 weeks and refills will be sent to [facility]

## 2019-04-02 NOTE — Assessment & Plan Note (Signed)
Chronic Related to deconditioning and obesity Discussed need to walk around the house, even though she is staying home and avoiding others due to pandemic Consider PT after pandemic

## 2019-04-02 NOTE — Assessment & Plan Note (Signed)
As above, patient hesistantly agrees to start Crestor 5mg  daily Take CoQ10 to minimize side effects Recheck CMP and FLP in ~3 months

## 2019-04-02 NOTE — Assessment & Plan Note (Signed)
Stable, no angina Discussed goal LDL of <70, and how current LDL is quite elevated Patient very hesistant to take statin Discussed need to decreased heart disease and stroke risk Patient hesistantly agrees to try Crestor - will start 5mg  daily Plan to recheck CMP and Lipids in ~3 months

## 2019-04-02 NOTE — Assessment & Plan Note (Signed)
Control improving but not to goal Discussed A1c goal of <7 Will stop glipizide and try Jardiance instead Patient is at risk for hypoglycemic events and complications on sulfonylurea Continue Trulicity weekly F/u in 3 m and repeat A1c

## 2019-04-02 NOTE — Assessment & Plan Note (Signed)
Related to diabetes chronci and stable Continue gabapentin Consider PT after pandemic

## 2019-04-14 ENCOUNTER — Encounter: Payer: Self-pay | Admitting: Family Medicine

## 2019-04-16 ENCOUNTER — Encounter: Payer: Self-pay | Admitting: Family Medicine

## 2019-04-16 ENCOUNTER — Ambulatory Visit (INDEPENDENT_AMBULATORY_CARE_PROVIDER_SITE_OTHER): Payer: Medicare HMO | Admitting: Family Medicine

## 2019-04-16 DIAGNOSIS — M0579 Rheumatoid arthritis with rheumatoid factor of multiple sites without organ or systems involvement: Secondary | ICD-10-CM

## 2019-04-16 DIAGNOSIS — G8929 Other chronic pain: Secondary | ICD-10-CM | POA: Diagnosis not present

## 2019-04-16 DIAGNOSIS — G63 Polyneuropathy in diseases classified elsewhere: Secondary | ICD-10-CM

## 2019-04-16 DIAGNOSIS — G894 Chronic pain syndrome: Secondary | ICD-10-CM

## 2019-04-16 DIAGNOSIS — M5441 Lumbago with sciatica, right side: Secondary | ICD-10-CM | POA: Diagnosis not present

## 2019-04-16 DIAGNOSIS — M5442 Lumbago with sciatica, left side: Secondary | ICD-10-CM

## 2019-04-16 DIAGNOSIS — M47816 Spondylosis without myelopathy or radiculopathy, lumbar region: Secondary | ICD-10-CM | POA: Diagnosis not present

## 2019-04-16 DIAGNOSIS — M150 Primary generalized (osteo)arthritis: Secondary | ICD-10-CM

## 2019-04-16 DIAGNOSIS — F331 Major depressive disorder, recurrent, moderate: Secondary | ICD-10-CM | POA: Diagnosis not present

## 2019-04-16 DIAGNOSIS — M159 Polyosteoarthritis, unspecified: Secondary | ICD-10-CM

## 2019-04-16 DIAGNOSIS — M8949 Other hypertrophic osteoarthropathy, multiple sites: Secondary | ICD-10-CM

## 2019-04-16 MED ORDER — OXYCODONE-ACETAMINOPHEN 5-325 MG PO TABS: 1.0000 | ORAL_TABLET | Freq: Four times a day (QID) | ORAL | 0 refills | Status: DC | PRN

## 2019-04-16 MED ORDER — PREGABALIN 75 MG PO CAPS: 75.0000 mg | ORAL_CAPSULE | Freq: Three times a day (TID) | ORAL | 3 refills | Status: DC

## 2019-04-16 MED ORDER — DULOXETINE HCL 30 MG PO CPEP: 30.0000 mg | ORAL_CAPSULE | Freq: Every day | ORAL | 3 refills | Status: DC

## 2019-04-16 MED ORDER — NAPROXEN 500 MG PO TABS: 500.0000 mg | ORAL_TABLET | Freq: Two times a day (BID) | ORAL | 2 refills | Status: DC

## 2019-04-16 NOTE — Progress Notes (Signed)
Patient: [name] [name] Female    DOB: [birth year]   68 y.o.   MRN: [id number] Visit Date: 04/20/2019  Today's Provider: Lavon Paganini, MD   Chief Complaint  Patient presents with  . Pain Management   Subjective:    Virtual Visit via Video Note  I connected with [name] on 04/20/19 at  9:40 AM EDT by a video enabled telemedicine application and verified that I am speaking with the correct person using two identifiers.   Patient location: home Provider location: home office  Persons involved in the visit: patient, provider     I discussed the limitations of evaluation and management by telemedicine and the availability of in person appointments. The patient expressed understanding and agreed to proceed.  HPI Pain Management:   Patient reports chronic pain of bilateral knees, thumbs, hips and her low back.  She occasionally has sciatica in both legs.  She has known rheumatoid arthritis, osteoarthritis, lumbar spondylosis, lumbar stenosis.  Current pain regimen includes ibuprofen 600 mg up to 6 times daily and Tylenol 1000 mg every 4 hours.  Her rheumatoid arthritis was previously in remission and she was previously followed by [facility] rheumatology.  After being diagnosed with heart failure, she had to stop her Biologics.  She has been without edema GERD and uncontrolled since that time.  Patient was previously followed by the pain clinic and she was taking oxycodone 10-325 mg that seemed to help.  Lyrica was helpful in the past.  Tramadol did not seem helpful.  She was dismissed from the pain clinic after having something on her urine drug screen that she was not prescribed.  She is also THC positive, but she has been using CBD oil at that time.  She continues to deny that she was taking anything that she is not prescribed.  Patient also admits that her depression is not well controlled.  She is having a hard time with quarantine and her husband's cancer  diagnosis.  She denies any SI/HI  Depression screen [facility] 2/9 04/16/2019 11/05/2018 07/08/2018 04/10/2018 01/22/2018  Decreased Interest 1 0 0 3 0  Down, Depressed, Hopeless 1 0 0 1 0  PHQ - 2 Score 2 0 0 4 0  Altered sleeping 0 - - 1 -  Tired, decreased energy 3 - - 3 -  Change in appetite 1 - - 0 -  Feeling bad or failure about yourself  0 - - 1 -  Trouble concentrating 2 - - 0 -  Moving slowly or fidgety/restless 0 - - 0 -  Suicidal thoughts 0 - - 0 -  PHQ-9 Score 8 - - 9 -  Difficult doing work/chores Very difficult - - Somewhat difficult -    Allergies  Allergen Reactions  . Latex Itching    itching  . Doxycycline Rash  . Sulfa Antibiotics Itching, Rash and Swelling     Current Outpatient Medications:  .  cetirizine (ZYRTEC) 10 MG tablet, TAKE 1 TABLET (10 MG TOTAL) BY MOUTH DAILY. (NOT COVER*), Disp: 90 tablet, Rfl: 3 .  Dulaglutide 1.5 MG/0.5ML SOPN, Inject 1.5 mg into the skin once a week., Disp: 12 pen, Rfl: 3 .  empagliflozin (JARDIANCE) 10 MG TABS tablet, Take 10 mg by mouth daily., Disp: 30 tablet, Rfl: 0 .  glucose blood (ACCU-CHEK AVIVA PLUS) test strip, To check blood sugar once a day.  DX: E11.9, Disp: 100 each, Rfl: 1 .  pregabalin (LYRICA) 75 MG capsule, Take  1 capsule (75 mg total) by mouth 3 (three) times daily., Disp: 90 capsule, Rfl: 3 .  rosuvastatin (CRESTOR) 5 MG tablet, Take 1 tablet (5 mg total) by mouth daily., Disp: 30 tablet, Rfl: 0 .  Vitamin D, Ergocalciferol, (DRISDOL) 1.25 MG (50000 UT) CAPS capsule, TAKE 1 CAPSULE EVERY 7 DAYS, Disp: 12 capsule, Rfl: 0 .  DULoxetine (CYMBALTA) 30 MG capsule, Take 1 capsule (30 mg total) by mouth daily., Disp: 30 capsule, Rfl: 3 .  mirabegron ER (MYRBETRIQ) 50 MG TB24 tablet, Take 1 tablet (50 mg total) by mouth daily. (Patient not taking: Reported on 03/30/2019), Disp: 30 tablet, Rfl: 11 .  naproxen (NAPROSYN) 500 MG tablet, Take 1 tablet (500 mg total) by mouth 2 (two) times daily with a meal., Disp: 60 tablet, Rfl: 2 .   oxyCODONE-acetaminophen (PERCOCET/ROXICET) 5-325 MG tablet, Take 1 tablet by mouth every 6 (six) hours as needed for severe pain., Disp: 60 tablet, Rfl: 0  Review of Systems  Constitutional: Negative.   Respiratory: Negative.   Cardiovascular: Negative.   Musculoskeletal: Negative.     Social History   Tobacco Use  . Smoking status: Former Smoker    Packs/day: 1.00    Years: 29.00    Pack years: 29.00    Last attempt to quit: 12/03/1996    Years since quitting: 22.3  . Smokeless tobacco: Never Used  Substance Use Topics  . Alcohol use: No      Objective:   LMP  (LMP Unknown) Comment: age 46 There were no vitals filed for this visit.   Physical Exam Constitutional:      Appearance: Normal appearance.  Pulmonary:     Effort: Pulmonary effort is normal. No respiratory distress.  Neurological:     Mental Status: She is alert and oriented to person, place, and time. Mental status is at baseline.  Psychiatric:        Mood and Affect: Mood is depressed. Affect is tearful.        Thought Content: Thought content does not include homicidal or suicidal ideation.         Assessment & Plan    I discussed the assessment and treatment plan with the patient. The patient was provided an opportunity to ask questions and all were answered. The patient agreed with the plan and demonstrated an understanding of the instructions.   The patient was advised to call back or seek an in-person evaluation if the symptoms worsen or if the condition fails to improve as anticipated.  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)    As below for chronic pain management      Relevant Medications   pregabalin (LYRICA) 75 MG capsule   DULoxetine (CYMBALTA) 30 MG capsule   naproxen (NAPROSYN) 500 MG tablet   oxyCODONE-acetaminophen (PERCOCET/ROXICET) 5-325 MG tablet   Peripheral neuropathy    Related to  diabetes Start Lyrica 75 mg 3 times daily      Relevant Medications   pregabalin (LYRICA) 75 MG capsule   DULoxetine (CYMBALTA) 30 MG capsule     Musculoskeletal and Integument   Lumbar spondylosis (Chronic)    Management as below for chronic pain therapy      Relevant Medications   naproxen (NAPROSYN) 500 MG tablet   oxyCODONE-acetaminophen (PERCOCET/ROXICET) 5-325 MG tablet   Rheumatoid arthritis (H[facility]) - Primary    Uncontrolled Contributes to her chronic pain Previously  followed by rheumatology Likely needs a DMARD We will refer back to rheumatology to see if there are any new medications that would be okay for her to take in the setting of her CHF Pain management as below      Relevant Medications   naproxen (NAPROSYN) 500 MG tablet   oxyCODONE-acetaminophen (PERCOCET/ROXICET) 5-325 MG tablet   Other Relevant Orders   Ambulatory referral to Rheumatology   Osteoarthritis    As below for chronic pain      Relevant Medications   naproxen (NAPROSYN) 500 MG tablet   oxyCODONE-acetaminophen (PERCOCET/ROXICET) 5-325 MG tablet     Other   Chronic pain syndrome (Chronic)    Chronic Uncontrolled Related to RA, OA, lumbar spondylosis and stenosis Discussed daily limits on Tylenol and ibuprofen use Ideally would like to limit NSAID use given her history of PUD and CHF For now, I will start her on naproxen 500 mg twice daily and have her stop ibuprofen so that we can limit the intake and we will plan to discontinue NSAIDs as her other pain management helps in the future Also starting Lyrica 75 mg 3 times daily and switching her Celexa to Cymbalta 30 mg daily In the future, can increase Cymbalta to 60 mg daily if she tolerates well I have also had her complete a pain contract and I will prescribe Percocet 5-325 mg tablets 60/month Discussed that she should only use this for moments of severe pain and try to truly take it on an as-needed basis Discussed the limited role for  chronic narcotics for chronic pain and the goal of using other adjunct therapies to help with pain Discussed random drug screening and other conditions of the pain contract Discussed that I do not plan to escalate her opioids in the future      Relevant Medications   pregabalin (LYRICA) 75 MG capsule   DULoxetine (CYMBALTA) 30 MG capsule   naproxen (NAPROSYN) 500 MG tablet   oxyCODONE-acetaminophen (PERCOCET/ROXICET) 5-325 MG tablet   MDD (major depressive disorder)    Uncontrolled Pain and curent pandemic, as well as husband's poor prognosis weigh on her Will switch from celexa to cymbalta to help with depression as well as chronic pain Start 30mg  daily D/c celexa Discussed potential side effects      Relevant Medications   DULoxetine (CYMBALTA) 30 MG capsule       Return in about 4 weeks (around 05/14/2019) for Chronic pain and depression follow-up.   The entirety of the information documented in the History of Present Illness, Review of Systems and Physical Exam were personally obtained by me. Portions of this information were initially documented by Tiburcio Pea, CMA and reviewed by me for thoroughness and accuracy.    Thurza Kwiecinski, Dionne Bucy, MD MPH [facility]

## 2019-04-20 NOTE — Assessment & Plan Note (Signed)
Uncontrolled Contributes to her chronic pain Previously followed by rheumatology Likely needs a DMARD We will refer back to rheumatology to see if there are any new medications that would be okay for her to take in the setting of her CHF Pain management as below

## 2019-04-20 NOTE — Assessment & Plan Note (Signed)
As below for chronic pain

## 2019-04-20 NOTE — Assessment & Plan Note (Signed)
Uncontrolled Pain and curent pandemic, as well as husband's poor prognosis weigh on her Will switch from celexa to cymbalta to help with depression as well as chronic pain Start 30mg  daily D/c celexa Discussed potential side effects

## 2019-04-20 NOTE — Assessment & Plan Note (Signed)
Related to diabetes Start Lyrica 75 mg 3 times daily

## 2019-04-20 NOTE — Assessment & Plan Note (Signed)
As below for chronic pain management

## 2019-04-20 NOTE — Assessment & Plan Note (Signed)
Chronic Uncontrolled Related to RA, OA, lumbar spondylosis and stenosis Discussed daily limits on Tylenol and ibuprofen use Ideally would like to limit NSAID use given her history of PUD and CHF For now, I will start her on naproxen 500 mg twice daily and have her stop ibuprofen so that we can limit the intake and we will plan to discontinue NSAIDs as her other pain management helps in the future Also starting Lyrica 75 mg 3 times daily and switching her Celexa to Cymbalta 30 mg daily In the future, can increase Cymbalta to 60 mg daily if she tolerates well I have also had her complete a pain contract and I will prescribe Percocet 5-325 mg tablets 60/month Discussed that she should only use this for moments of severe pain and try to truly take it on an as-needed basis Discussed the limited role for chronic narcotics for chronic pain and the goal of using other adjunct therapies to help with pain Discussed random drug screening and other conditions of the pain contract Discussed that I do not plan to escalate her opioids in the future

## 2019-04-20 NOTE — Assessment & Plan Note (Signed)
Management as below for chronic pain therapy

## 2019-04-23 ENCOUNTER — Other Ambulatory Visit: Payer: Self-pay | Admitting: Family Medicine

## 2019-04-24 ENCOUNTER — Other Ambulatory Visit: Payer: Self-pay | Admitting: Family Medicine

## 2019-05-01 ENCOUNTER — Other Ambulatory Visit: Payer: Self-pay | Admitting: Family Medicine

## 2019-05-01 NOTE — Telephone Encounter (Signed)
[facility] faxed refill request for the following medications:  naproxen (NAPROSYN) 500 MG tablet  JARDIANCE 10 MG TABS tablet  oxyCODONE-acetaminophen (PERCOCET/ROXICET) 5-325 MG tablet   DULoxetine (CYMBALTA) 30 MG capsule  rosuvastatin (CRESTOR) 5 MG tablet   Please advise.

## 2019-05-04 ENCOUNTER — Other Ambulatory Visit: Payer: Self-pay | Admitting: Family Medicine

## 2019-05-04 DIAGNOSIS — E559 Vitamin D deficiency, unspecified: Secondary | ICD-10-CM

## 2019-05-04 MED ORDER — EMPAGLIFLOZIN 10 MG PO TABS: 10.0000 mg | ORAL_TABLET | Freq: Every day | ORAL | 1 refills | Status: DC

## 2019-05-04 MED ORDER — DULOXETINE HCL 30 MG PO CPEP: 30.0000 mg | ORAL_CAPSULE | Freq: Every day | ORAL | 1 refills | Status: DC

## 2019-05-04 MED ORDER — ROSUVASTATIN CALCIUM 5 MG PO TABS: 5.0000 mg | ORAL_TABLET | Freq: Every day | ORAL | 1 refills | Status: DC

## 2019-05-04 MED ORDER — NAPROXEN 500 MG PO TABS: 500.0000 mg | ORAL_TABLET | Freq: Two times a day (BID) | ORAL | 1 refills | Status: DC

## 2019-05-04 NOTE — Telephone Encounter (Signed)
Will get oxycodone at local pharmacy (only 1 month supply at a time). Other refills sent

## 2019-05-05 DIAGNOSIS — M170 Bilateral primary osteoarthritis of knee: Secondary | ICD-10-CM | POA: Diagnosis not present

## 2019-05-05 DIAGNOSIS — I5022 Chronic systolic (congestive) heart failure: Secondary | ICD-10-CM | POA: Diagnosis not present

## 2019-05-05 DIAGNOSIS — M0579 Rheumatoid arthritis with rheumatoid factor of multiple sites without organ or systems involvement: Secondary | ICD-10-CM | POA: Diagnosis not present

## 2019-05-06 ENCOUNTER — Telehealth: Payer: Self-pay | Admitting: Family Medicine

## 2019-05-06 NOTE — Telephone Encounter (Signed)
°  Opened in error - [facility] °

## 2019-05-07 ENCOUNTER — Other Ambulatory Visit: Payer: Self-pay | Admitting: Family Medicine

## 2019-05-07 DIAGNOSIS — E559 Vitamin D deficiency, unspecified: Secondary | ICD-10-CM

## 2019-05-07 MED ORDER — VITAMIN D (ERGOCALCIFEROL) 1.25 MG (50000 UNIT) PO CAPS: 50000.0000 [IU] | ORAL_CAPSULE | ORAL | 0 refills | Status: DC

## 2019-05-07 NOTE — Telephone Encounter (Signed)
Oxycodone last filled 04/16/2019.  Too early for a refill

## 2019-05-07 NOTE — Telephone Encounter (Signed)
Please review

## 2019-05-13 ENCOUNTER — Other Ambulatory Visit: Payer: Self-pay | Admitting: Family Medicine

## 2019-05-13 MED ORDER — OXYCODONE-ACETAMINOPHEN 5-325 MG PO TABS: 1.0000 | ORAL_TABLET | Freq: Four times a day (QID) | ORAL | 0 refills | Status: DC | PRN

## 2019-05-14 NOTE — Progress Notes (Signed)
Patient: [name] [name] Female    DOB: [birth year]   68 y.o.   MRN: [id number] Visit Date: 05/15/2019  Today's Provider: Lavon Paganini, MD   Chief Complaint  Patient presents with  . Pain Management   Subjective:    Virtual Visit via Video Note  I connected with [name] on 05/15/19 at  9:40 AM EDT by a video enabled telemedicine application and verified that I am speaking with the correct person using two identifiers.   Patient location: home Provider location: [place] involved in the visit: patient, provider   I discussed the limitations of evaluation and management by telemedicine and the availability of in person appointments. The patient expressed understanding and agreed to proceed.  Interactive audio and video communications were attempted, although failed due to patient's inability to connect to video. Continued visit with audio only interaction with patient agreement.    HPI Pain Management:  Patient presents for a 1 month follow up. Last visit was on 04/16/2019. Patient started Cymbalta 30 mg and Lyrica 75 mg TID. She reports good compliance with treatment plan. She states symptoms are  much better.  Seeing Rheumatology next week and excited to look into new DMARDs.  Taking Oxycodone prn - some days not at all, some days more.  Using 60 tabs q30 days.  She does admits some mild burning in her feet     Allergies  Allergen Reactions  . Latex Itching    itching  . Doxycycline Rash  . Sulfa Antibiotics Itching, Rash and Swelling     Current Outpatient Medications:  .  cetirizine (ZYRTEC) 10 MG tablet, TAKE 1 TABLET (10 MG TOTAL) BY MOUTH DAILY. (NOT COVER*), Disp: 90 tablet, Rfl: 3 .  Dulaglutide 1.5 MG/0.5ML SOPN, Inject 1.5 mg into the skin once a week., Disp: 12 pen, Rfl: 3 .  DULoxetine (CYMBALTA) 30 MG capsule, Take 1 capsule (30 mg total) by mouth daily., Disp: 90 capsule, Rfl: 1 .  empagliflozin (JARDIANCE) 10 MG  TABS tablet, Take 10 mg by mouth daily., Disp: 90 tablet, Rfl: 1 .  glucose blood (ACCU-CHEK AVIVA PLUS) test strip, To check blood sugar once a day.  DX: E11.9, Disp: 100 each, Rfl: 1 .  naproxen (NAPROSYN) 500 MG tablet, Take 1 tablet (500 mg total) by mouth 2 (two) times daily with a meal., Disp: 180 tablet, Rfl: 1 .  [START ON 05/16/2019] oxyCODONE-acetaminophen (PERCOCET/ROXICET) 5-325 MG tablet, Take 1 tablet by mouth every 6 (six) hours as needed for severe pain., Disp: 60 tablet, Rfl: 0 .  pregabalin (LYRICA) 75 MG capsule, Take 1 capsule (75 mg total) by mouth 3 (three) times daily., Disp: 90 capsule, Rfl: 3 .  rosuvastatin (CRESTOR) 5 MG tablet, Take 1 tablet (5 mg total) by mouth daily., Disp: 90 tablet, Rfl: 1 .  Vitamin D, Ergocalciferol, (DRISDOL) 1.25 MG (50000 UT) CAPS capsule, Take 1 capsule (50,000 Units total) by mouth every 7 (seven) days., Disp: 12 capsule, Rfl: 0 .  mirabegron ER (MYRBETRIQ) 50 MG TB24 tablet, Take 1 tablet (50 mg total) by mouth daily. (Patient not taking: Reported on 03/30/2019), Disp: 30 tablet, Rfl: 11  Review of Systems  Constitutional: Negative.   Respiratory: Negative.   Cardiovascular: Negative.   Musculoskeletal: Positive for arthralgias and myalgias.    Social History   Tobacco Use  . Smoking status: Former Smoker    Packs/day: 1.00    Years: 29.00    Pack years:  29.00    Quit date: 12/03/1996    Years since quitting: 22.4  . Smokeless tobacco: Never Used  Substance Use Topics  . Alcohol use: No      Objective:   LMP  (LMP Unknown) Comment: age 69 There were no vitals filed for this visit.   Physical Exam      Assessment & Plan      I discussed the assessment and treatment plan with the patient. The patient was provided an opportunity to ask questions and all were answered. The patient agreed with the plan and demonstrated an understanding of the instructions.   The patient was advised to call back or seek an in-person  evaluation if the symptoms worsen or if the condition fails to improve as anticipated.  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)    As below for chronic pain management      Peripheral neuropathy    2/2 DM Improving, but will increase Lyrica dose to 300mg  daily  F/u in 1 month        Musculoskeletal and Integument   Rheumatoid arthritis (HCC) - Primary    Uncontrolled Contributes to chronic pain Has appt upcoming with Rheum         Other   Chronic pain syndrome (Chronic)    Chronic Improving Will increase Lyrica to 75mg  qAM, 75mg  in the afternoon and 150mg  qhs Continue cymbalta - can increase to 60mg  daily in the future Suspect that seeing Rheum and maybe starting DMARD will help as well Plan to decrease # of percocet to 45 at next refill F/u in 1 month      MDD (major depressive disorder)    Well controlled continue Cymbalta at current dose      Venous stasis    Reports some open wounds and "water blisters" Denies any redness, tenderness, or fever Advised elevation and compression socks      Lymphedema    Advised compression and elevation as above          Return in about 4 weeks (around 06/12/2019) for chronic pain f/u.   The entirety of the information documented in the History of Present Illness, Review of Systems and Physical Exam were personally obtained by me. Portions of this information were initially documented by Tiburcio Pea, CMA and reviewed by me for thoroughness and accuracy.    Amoy Steeves, Dionne Bucy, MD MPH [facility]

## 2019-05-15 ENCOUNTER — Encounter: Payer: Self-pay | Admitting: Family Medicine

## 2019-05-15 ENCOUNTER — Ambulatory Visit (INDEPENDENT_AMBULATORY_CARE_PROVIDER_SITE_OTHER): Payer: Medicare HMO | Admitting: Family Medicine

## 2019-05-15 ENCOUNTER — Other Ambulatory Visit: Payer: Self-pay

## 2019-05-15 DIAGNOSIS — F331 Major depressive disorder, recurrent, moderate: Secondary | ICD-10-CM | POA: Diagnosis not present

## 2019-05-15 DIAGNOSIS — G63 Polyneuropathy in diseases classified elsewhere: Secondary | ICD-10-CM | POA: Diagnosis not present

## 2019-05-15 DIAGNOSIS — M0579 Rheumatoid arthritis with rheumatoid factor of multiple sites without organ or systems involvement: Secondary | ICD-10-CM | POA: Diagnosis not present

## 2019-05-15 DIAGNOSIS — I878 Other specified disorders of veins: Secondary | ICD-10-CM

## 2019-05-15 DIAGNOSIS — G894 Chronic pain syndrome: Secondary | ICD-10-CM

## 2019-05-15 DIAGNOSIS — I890 Lymphedema, not elsewhere classified: Secondary | ICD-10-CM | POA: Diagnosis not present

## 2019-05-15 DIAGNOSIS — M5441 Lumbago with sciatica, right side: Secondary | ICD-10-CM | POA: Diagnosis not present

## 2019-05-15 DIAGNOSIS — M5442 Lumbago with sciatica, left side: Secondary | ICD-10-CM | POA: Diagnosis not present

## 2019-05-15 DIAGNOSIS — G8929 Other chronic pain: Secondary | ICD-10-CM | POA: Diagnosis not present

## 2019-05-15 NOTE — Assessment & Plan Note (Signed)
Chronic Improving Will increase Lyrica to 75mg  qAM, 75mg  in the afternoon and 150mg  qhs Continue cymbalta - can increase to 60mg  daily in the future Suspect that seeing Rheum and maybe starting DMARD will help as well Plan to decrease # of percocet to 45 at next refill F/u in 1 month

## 2019-05-15 NOTE — Assessment & Plan Note (Signed)
Uncontrolled Contributes to chronic pain Has appt upcoming with Rheum

## 2019-05-15 NOTE — Assessment & Plan Note (Signed)
As below for chronic pain management

## 2019-05-15 NOTE — Assessment & Plan Note (Signed)
Advised compression and elevation as above

## 2019-05-15 NOTE — Assessment & Plan Note (Signed)
2/2 DM Improving, but will increase Lyrica dose to 300mg  daily  F/u in 1 month

## 2019-05-15 NOTE — Assessment & Plan Note (Signed)
Well controlled continue Cymbalta at current dose

## 2019-05-15 NOTE — Assessment & Plan Note (Signed)
Reports some open wounds and "water blisters" Denies any redness, tenderness, or fever Advised elevation and compression socks

## 2019-05-19 DIAGNOSIS — M059 Rheumatoid arthritis with rheumatoid factor, unspecified: Secondary | ICD-10-CM | POA: Diagnosis not present

## 2019-05-19 DIAGNOSIS — Z79899 Other long term (current) drug therapy: Secondary | ICD-10-CM | POA: Diagnosis not present

## 2019-05-19 DIAGNOSIS — I5022 Chronic systolic (congestive) heart failure: Secondary | ICD-10-CM | POA: Diagnosis not present

## 2019-05-19 DIAGNOSIS — M170 Bilateral primary osteoarthritis of knee: Secondary | ICD-10-CM | POA: Diagnosis not present

## 2019-06-12 ENCOUNTER — Encounter: Payer: Self-pay | Admitting: Family Medicine

## 2019-06-12 ENCOUNTER — Ambulatory Visit (INDEPENDENT_AMBULATORY_CARE_PROVIDER_SITE_OTHER): Payer: Medicare HMO | Admitting: Family Medicine

## 2019-06-12 DIAGNOSIS — M5441 Lumbago with sciatica, right side: Secondary | ICD-10-CM

## 2019-06-12 DIAGNOSIS — G894 Chronic pain syndrome: Secondary | ICD-10-CM | POA: Diagnosis not present

## 2019-06-12 DIAGNOSIS — F331 Major depressive disorder, recurrent, moderate: Secondary | ICD-10-CM

## 2019-06-12 DIAGNOSIS — M0579 Rheumatoid arthritis with rheumatoid factor of multiple sites without organ or systems involvement: Secondary | ICD-10-CM | POA: Diagnosis not present

## 2019-06-12 DIAGNOSIS — Z794 Long term (current) use of insulin: Secondary | ICD-10-CM | POA: Diagnosis not present

## 2019-06-12 DIAGNOSIS — K5903 Drug induced constipation: Secondary | ICD-10-CM

## 2019-06-12 DIAGNOSIS — G8929 Other chronic pain: Secondary | ICD-10-CM

## 2019-06-12 DIAGNOSIS — M5442 Lumbago with sciatica, left side: Secondary | ICD-10-CM | POA: Diagnosis not present

## 2019-06-12 DIAGNOSIS — G63 Polyneuropathy in diseases classified elsewhere: Secondary | ICD-10-CM

## 2019-06-12 MED ORDER — DULOXETINE HCL 60 MG PO CPEP: 60.0000 mg | ORAL_CAPSULE | Freq: Every day | ORAL | 3 refills | Status: DC

## 2019-06-12 NOTE — Assessment & Plan Note (Signed)
Improving Now seeing rheumatology She was started on methotrexate and folic acid She reports that they are trying to get Orencia approved as well Contributes to her chronic pain

## 2019-06-12 NOTE — Progress Notes (Signed)
Patient: [name] [name] Female    DOB: Nov 06, 1951   68 y.o.   MRN: [id number] Visit Date: 06/12/2019  Today's Provider: Lavon Paganini, MD   Chief Complaint  Patient presents with  . Pain Management   Subjective:    Virtual Visit via Telephone Note  I connected with [name] [name] on 06/12/19 at 10:40 AM EDT by telephone and verified that I am speaking with the correct person using two identifiers.  Patient location: home Provider location: [place] involved in the visit: patient, provider   I discussed the limitations, risks, security and privacy concerns of performing an evaluation and management service by telephone and the availability of in person appointments. I also discussed with the patient that there may be a patient responsible charge related to this service. The patient expressed understanding and agreed to proceed.  HPI  Pain Management Patient presents today for pain management follow-up. Patient last office visit was 05/15/2019 and patient [name] [name] was increased to 75 mg qAM, 75 mg in the afternoon, 150 mg ghs.  Patient states that the pain has improved and her pain level is about a 4. Patient went to see the rheumatology on 05/19/2019 and stated that they put her on Methotrexate 50MG  and Folic acid 1 mg.  States that she feels nauseated whenever she eats.  Having constipation for last few months, but worse over last several days.  Does not feel like this has worsened since starting Methotrexate.  Last BM ~4 days ago.  Hard BMs and straining.  Last took percocet 2 days ago.  She would like to stop taking this.  She feels like her pain is well enough controlled with methotrexate, Lyrica, Cymbalta  Her husband is terminally ill and she reports that his condition has significantly worsened since our last conversation.  She finds herself worrying and stressed and feeling more depressed about this.  She states that Cymbalta does help her mood  some.    Allergies  Allergen Reactions  . Latex Itching    itching  . Doxycycline Rash  . Sulfa Antibiotics Itching, Rash and Swelling     Current Outpatient Medications:  .  cetirizine (ZYRTEC) 10 MG tablet, TAKE 1 TABLET (10 MG TOTAL) BY MOUTH DAILY. (NOT COVER*), Disp: 90 tablet, Rfl: 3 .  Dulaglutide 1.5 MG/0.5ML SOPN, Inject 1.5 mg into the skin once a week., Disp: 12 pen, Rfl: 3 .  DULoxetine (CYMBALTA) 30 MG capsule, Take 1 capsule (30 mg total) by mouth daily., Disp: 90 capsule, Rfl: 1 .  empagliflozin (JARDIANCE) 10 MG TABS tablet, Take 10 mg by mouth daily., Disp: 90 tablet, Rfl: 1 .  folic acid (FOLVITE) 1 MG tablet, Take 1 mg by mouth daily., Disp: , Rfl:  .  glucose blood (ACCU-CHEK AVIVA PLUS) test strip, To check blood sugar once a day.  DX: E11.9, Disp: 100 each, Rfl: 1 .  INSULIN SYRINGE 1CC/29G 29G X 1/2" 1 ML MISC, See admin instructions., Disp: , Rfl:  .  methotrexate 50 MG/2ML injection, TAKE 0.8 ML ONCE A WEEK FOR 12 WEEKS, Disp: , Rfl:  .  naproxen (NAPROSYN) 500 MG tablet, Take 1 tablet (500 mg total) by mouth 2 (two) times daily with a meal., Disp: 180 tablet, Rfl: 1 .  oxyCODONE-acetaminophen (PERCOCET/ROXICET) 5-325 MG tablet, Take 1 tablet by mouth every 6 (six) hours as needed for severe pain., Disp: 60 tablet, Rfl: 0 .  pregabalin (LYRICA) 75 MG capsule, Take 1  capsule (75 mg total) by mouth 3 (three) times daily., Disp: 90 capsule, Rfl: 3 .  rosuvastatin (CRESTOR) 5 MG tablet, Take 1 tablet (5 mg total) by mouth daily., Disp: 90 tablet, Rfl: 1 .  Vitamin D, Ergocalciferol, (DRISDOL) 1.25 MG (50000 UT) CAPS capsule, Take 1 capsule (50,000 Units total) by mouth every 7 (seven) days., Disp: 12 capsule, Rfl: 0 .  mirabegron ER (MYRBETRIQ) 50 MG TB24 tablet, Take 1 tablet (50 mg total) by mouth daily. (Patient not taking: Reported on 03/30/2019), Disp: 30 tablet, Rfl: 11  Review of Systems  Constitutional: Negative.   Respiratory: Negative.   Genitourinary:  Negative.   Neurological: Negative.     Social History   Tobacco Use  . Smoking status: Former Smoker    Packs/day: 1.00    Years: 29.00    Pack years: 29.00    Quit date: 12/03/1996    Years since quitting: 22.5  . Smokeless tobacco: Never Used  Substance Use Topics  . Alcohol use: No      Objective:   LMP  (LMP Unknown) Comment: age 59 There were no vitals filed for this visit.   Physical Exam   No results found for any visits on 06/12/19.     Assessment & Plan    I discussed the assessment and treatment plan with the patient. The patient was provided an opportunity to ask questions and all were answered. The patient agreed with the plan and demonstrated an understanding of the instructions.   The patient was advised to call back or seek an in-person evaluation if the symptoms worsen or if the condition fails to improve as anticipated.   Problem List Items Addressed This Visit      Digestive   Drug-induced constipation    Longstanding problem, but newly discussed Discussed with patient that opioids can induce significant constipation As she feels very constipated, we will do a MiraLAX cleanout and then maintain her on daily MiraLAX to maintain 1 soft bowel movement daily Discussed starting with 1 capful of MiraLAX daily and titrating upwards as needed to one soft bowel movement daily Also discussed hydration and fiber in her diet Stopping the oxycodone as below should help as well        Nervous and Auditory   Chronic bilateral low back pain with bilateral sciatica - Primary (Chronic)    As below for chronic pain management      Relevant Medications   DULoxetine (CYMBALTA) 60 MG capsule   Peripheral neuropathy    Secondary to diabetes Improving Continue current Lyrica dose of 75 mg each morning, 75 mg midday, and 150 mg nightly for total of 300 mg daily      Relevant Medications   DULoxetine (CYMBALTA) 60 MG capsule     Musculoskeletal and Integument    Rheumatoid arthritis ([place])    Improving Now seeing rheumatology She was started on methotrexate and folic acid She reports that they are trying to get Orencia approved as well Contributes to her chronic pain      Relevant Medications   methotrexate 50 MG/2ML injection     Other   Chronic pain syndrome (Chronic)    Chronic and improving Continue current Lyrica dose Increase Cymbalta to 60 mg daily as below for mood She is doing better now that she is seeing rheumatology and starting a DMARD We will discontinue her Percocet and as she has not been taking this regularly, I doubt she will have any withdrawal symptoms  Relevant Medications   DULoxetine (CYMBALTA) 60 MG capsule   MDD (major depressive disorder)   Relevant Medications   DULoxetine (CYMBALTA) 60 MG capsule       Return in about 4 weeks (around 07/10/2019) for chronic disease f/u.   The entirety of the information documented in the History of Present Illness, Review of Systems and Physical Exam were personally obtained by me. Portions of this information were initially documented by [facility], CMA and reviewed by me for thoroughness and accuracy.    Bacigalupo, Dionne Bucy, MD MPH [facility]

## 2019-06-12 NOTE — Assessment & Plan Note (Signed)
Secondary to diabetes Improving Continue current Lyrica dose of 75 mg each morning, 75 mg midday, and 150 mg nightly for total of 300 mg daily

## 2019-06-12 NOTE — Assessment & Plan Note (Signed)
Chronic and improving Continue current Lyrica dose Increase Cymbalta to 60 mg daily as below for mood She is doing better now that she is seeing rheumatology and starting a DMARD We will discontinue her Percocet and as she has not been taking this regularly, I doubt she will have any withdrawal symptoms

## 2019-06-12 NOTE — Patient Instructions (Signed)
Stop percocet Increase Cymbalta to 60mg  daily  Continue Lyrica at current dose  Miralax cleanout - Buy medium miralax bottle

## 2019-06-12 NOTE — Assessment & Plan Note (Signed)
Previously well controlled, but exacerbated by the worsening of her husband's condition We will increase Cymbalta to 60 mg daily to see if this will help more Contracted for safety-no SI/HI Discussed importance of therapy

## 2019-06-12 NOTE — Assessment & Plan Note (Signed)
As below for chronic pain management

## 2019-06-12 NOTE — Assessment & Plan Note (Signed)
Longstanding problem, but newly discussed Discussed with patient that opioids can induce significant constipation As she feels very constipated, we will do a MiraLAX cleanout and then maintain her on daily MiraLAX to maintain 1 soft bowel movement daily Discussed starting with 1 capful of MiraLAX daily and titrating upwards as needed to one soft bowel movement daily Also discussed hydration and fiber in her diet Stopping the oxycodone as below should help as well

## 2019-06-29 ENCOUNTER — Ambulatory Visit: Payer: Medicare HMO | Admitting: Family Medicine

## 2019-07-13 ENCOUNTER — Ambulatory Visit: Payer: Self-pay | Admitting: Family Medicine

## 2019-07-13 DIAGNOSIS — M059 Rheumatoid arthritis with rheumatoid factor, unspecified: Secondary | ICD-10-CM | POA: Diagnosis not present

## 2019-07-15 ENCOUNTER — Ambulatory Visit (INDEPENDENT_AMBULATORY_CARE_PROVIDER_SITE_OTHER): Payer: Medicare HMO | Admitting: Family Medicine

## 2019-07-15 ENCOUNTER — Encounter: Payer: Self-pay | Admitting: Family Medicine

## 2019-07-15 ENCOUNTER — Other Ambulatory Visit: Payer: Self-pay

## 2019-07-15 VITALS — BP 142/85 | HR 103 | Temp 98.6°F | Wt 351.6 lb

## 2019-07-15 DIAGNOSIS — G63 Polyneuropathy in diseases classified elsewhere: Secondary | ICD-10-CM | POA: Diagnosis not present

## 2019-07-15 DIAGNOSIS — I5022 Chronic systolic (congestive) heart failure: Secondary | ICD-10-CM

## 2019-07-15 DIAGNOSIS — E7800 Pure hypercholesterolemia, unspecified: Secondary | ICD-10-CM | POA: Diagnosis not present

## 2019-07-15 DIAGNOSIS — E11622 Type 2 diabetes mellitus with other skin ulcer: Secondary | ICD-10-CM | POA: Diagnosis not present

## 2019-07-15 DIAGNOSIS — R32 Unspecified urinary incontinence: Secondary | ICD-10-CM

## 2019-07-15 DIAGNOSIS — L97902 Non-pressure chronic ulcer of unspecified part of unspecified lower leg with fat layer exposed: Secondary | ICD-10-CM

## 2019-07-15 DIAGNOSIS — I10 Essential (primary) hypertension: Secondary | ICD-10-CM | POA: Diagnosis not present

## 2019-07-15 DIAGNOSIS — Z794 Long term (current) use of insulin: Secondary | ICD-10-CM | POA: Diagnosis not present

## 2019-07-15 DIAGNOSIS — B373 Candidiasis of vulva and vagina: Secondary | ICD-10-CM

## 2019-07-15 DIAGNOSIS — E1142 Type 2 diabetes mellitus with diabetic polyneuropathy: Secondary | ICD-10-CM | POA: Diagnosis not present

## 2019-07-15 DIAGNOSIS — I2510 Atherosclerotic heart disease of native coronary artery without angina pectoris: Secondary | ICD-10-CM

## 2019-07-15 DIAGNOSIS — Z6841 Body Mass Index (BMI) 40.0 and over, adult: Secondary | ICD-10-CM | POA: Diagnosis not present

## 2019-07-15 DIAGNOSIS — E559 Vitamin D deficiency, unspecified: Secondary | ICD-10-CM | POA: Diagnosis not present

## 2019-07-15 DIAGNOSIS — L97229 Non-pressure chronic ulcer of left calf with unspecified severity: Secondary | ICD-10-CM | POA: Diagnosis not present

## 2019-07-15 DIAGNOSIS — L97219 Non-pressure chronic ulcer of right calf with unspecified severity: Secondary | ICD-10-CM | POA: Diagnosis not present

## 2019-07-15 DIAGNOSIS — B3731 Acute candidiasis of vulva and vagina: Secondary | ICD-10-CM

## 2019-07-15 LAB — POCT GLYCOSYLATED HEMOGLOBIN (HGB A1C)
Est. average glucose Bld gHb Est-mCnc: 240
Hemoglobin A1C: 10 % — AB (ref 4.0–5.6)

## 2019-07-15 MED ORDER — MIRABEGRON ER 50 MG PO TB24: 50.0000 mg | ORAL_TABLET | Freq: Every day | ORAL | 11 refills | Status: DC

## 2019-07-15 MED ORDER — ROSUVASTATIN CALCIUM 5 MG PO TABS: 5.0000 mg | ORAL_TABLET | ORAL | 1 refills | Status: DC

## 2019-07-15 MED ORDER — EMPAGLIFLOZIN 25 MG PO TABS: 25.0000 mg | ORAL_TABLET | Freq: Every day | ORAL | 1 refills | Status: DC

## 2019-07-15 MED ORDER — FLUCONAZOLE 150 MG PO TABS: 150.0000 mg | ORAL_TABLET | Freq: Once | ORAL | 0 refills | Status: AC

## 2019-07-15 NOTE — Progress Notes (Signed)
Patient: [name] [name] Female    DOB: 10/27/1951   68 y.o.   MRN: [id number] Visit Date: 07/16/2019  Today's Provider: Lavon Paganini, MD   Chief Complaint  Patient presents with  . Diabetes  . Hypertension  . Hyperlipidemia   Subjective:    HPI  Diabetes Mellitus Type II, Follow-up:   Lab Results  Component Value Date   HGBA1C 10.0 (A) 07/15/2019   HGBA1C 7.8 (A) 03/25/2019   HGBA1C 10.1 (A) 07/09/2018   Last seen for diabetes 4 months ago.  Management since then includes:patient was advised to stop glipizide and try Jardiance instead . She reports good compliance with treatment. She is not having side effects.  Current symptoms include none and have been stable. Home blood sugar records: arranges:120's-130's per pt  Episodes of hypoglycemia? no   Current Insulin Regimen: none Most Recent Eye Exam: needs Weight trend: stable Prior visit with dietician: no Current diet: well balanced Current exercise: none  ------------------------------------------------------------------------   Hypertension, follow-up:  BP Readings from Last 3 Encounters:  07/15/19 (!) 142/85  11/05/18 (!) 155/97  10/09/18 (!) 138/93    She was last seen for hypertension 4 months ago.  BP at that visit was uncontrolled. Management since that visit includes: .She reports good compliance with treatment. She is not having side effects.  She is not exercising. She is adherent to low salt diet.   Outside blood pressures are not checked. She is experiencing none.  Patient denies chest pain, chest pressure/discomfort, exertional chest pressure/discomfort, fatigue, irregular heart beat, palpitations and tachypnea.   Cardiovascular risk factors include advanced age (older than 62 for men, 79 for women), diabetes mellitus and hypertension.  Use of agents associated with hypertension: none.   ------------------------------------------------------------------------     Lipid/Cholesterol, Follow-up:   Last seen for this 4 months ago.  Management since that visit includes:patient agreed to start Crestor 5mg  daily.( patient reports she is not taking the medication) She developed myalgias  Last Lipid Panel:    Component Value Date/Time   CHOL 244 (H) 07/15/2019 1026   TRIG 324 (H) 07/15/2019 1026   HDL 37 (L) 07/15/2019 1026   CHOLHDL 6.6 (H) 07/15/2019 1026   CHOLHDL 6 12/13/2016 1056   VLDL 50.8 (H) 12/13/2016 1056   LDLCALC 142 (H) 07/15/2019 1026   LDLDIRECT 183.0 12/13/2016 1056    She reports good compliance with treatment. She is not having side effects.   Wt Readings from Last 3 Encounters:  07/15/19 (!) 351 lb 9.6 oz (159.5 kg)  11/05/18 (!) 320 lb (145.2 kg)  10/09/18 (!) 322 lb (146.1 kg)    ------------------------------------------------------------------------    Allergies  Allergen Reactions  . Latex Itching    itching  . Doxycycline Rash  . Sulfa Antibiotics Itching, Rash and Swelling     Current Outpatient Medications:  .  cetirizine (ZYRTEC) 10 MG tablet, TAKE 1 TABLET (10 MG TOTAL) BY MOUTH DAILY. (NOT COVER*), Disp: 90 tablet, Rfl: 3 .  DULoxetine (CYMBALTA) 60 MG capsule, Take 1 capsule (60 mg total) by mouth daily., Disp: 90 capsule, Rfl: 3 .  folic acid (FOLVITE) 1 MG tablet, Take 1 mg by mouth daily., Disp: , Rfl:  .  glucose blood (ACCU-CHEK AVIVA PLUS) test strip, To check blood sugar once a day.  DX: E11.9, Disp: 100 each, Rfl: 1 .  INSULIN SYRINGE 1CC/29G 29G X 1/2" 1 ML MISC, See admin instructions., Disp: , Rfl:  .  methotrexate 50 MG/2ML injection, TAKE  0.8 ML ONCE A WEEK FOR 12 WEEKS, Disp: , Rfl:  .  mirabegron ER (MYRBETRIQ) 50 MG TB24 tablet, Take 1 tablet (50 mg total) by mouth daily., Disp: 30 tablet, Rfl: 11 .  naproxen (NAPROSYN) 500 MG tablet, Take 1 tablet (500 mg total) by mouth 2 (two) times daily with a meal., Disp: 180 tablet, Rfl: 1 .  pregabalin (LYRICA) 75 MG capsule, Take 1 capsule (75 mg  total) by mouth 3 (three) times daily., Disp: 90 capsule, Rfl: 3 .  Vitamin D, Ergocalciferol, (DRISDOL) 1.25 MG (50000 UT) CAPS capsule, Take 1 capsule (50,000 Units total) by mouth every 7 (seven) days., Disp: 12 capsule, Rfl: 0 .  Dulaglutide 1.5 MG/0.5ML SOPN, Inject 1.5 mg into the skin once a week. (Patient not taking: Reported on 07/15/2019), Disp: 12 pen, Rfl: 3 .  empagliflozin (JARDIANCE) 25 MG TABS tablet, Take 25 mg by mouth daily., Disp: 90 tablet, Rfl: 1 .  rosuvastatin (CRESTOR) 5 MG tablet, Take 1 tablet (5 mg total) by mouth 3 (three) times a week., Disp: 36 tablet, Rfl: 1  Review of Systems  Constitutional: Negative.   Respiratory: Negative.   Cardiovascular: Negative.   Neurological: Negative.     Social History   Tobacco Use  . Smoking status: Former Smoker    Packs/day: 1.00    Years: 29.00    Pack years: 29.00    Quit date: 12/03/1996    Years since quitting: 22.6  . Smokeless tobacco: Never Used  Substance Use Topics  . Alcohol use: No      Objective:   BP (!) 142/85 (BP Location: Left Wrist, Patient Position: Sitting, Cuff Size: Normal)   Pulse (!) 103   Temp 98.6 F (37 C) (Oral)   Wt (!) 351 lb 9.6 oz (159.5 kg)   LMP  (LMP Unknown) Comment: age 79  SpO2 93%   BMI 52.68 kg/m  Vitals:   07/15/19 0943 07/15/19 1022  BP: (!) 153/92 (!) 142/85  Pulse: (!) 103   Temp: 98.6 F (37 C)   TempSrc: Oral   SpO2: 93%   Weight: (!) 351 lb 9.6 oz (159.5 kg)      Physical Exam Vitals signs reviewed.  Constitutional:      General: She is not in acute distress.    Appearance: Normal appearance. She is not diaphoretic.  HENT:     Head: Normocephalic and atraumatic.  Eyes:     General: No scleral icterus.    Conjunctiva/sclera: Conjunctivae normal.  Neck:     Musculoskeletal: Neck supple.  Cardiovascular:     Rate and Rhythm: Normal rate and regular rhythm.     Pulses: Normal pulses.     Heart sounds: Normal heart sounds. No murmur.  Pulmonary:      Effort: Pulmonary effort is normal. No respiratory distress.     Breath sounds: Normal breath sounds. No wheezing.  Abdominal:     General: There is no distension.     Palpations: Abdomen is soft.     Tenderness: There is no abdominal tenderness.  Musculoskeletal:     Right lower leg: Edema present.     Left lower leg: Edema present.  Lymphadenopathy:     Cervical: No cervical adenopathy.  Skin:    Comments: Multiple ulcerations of bilateral legs. No signs of infection  Neurological:     Mental Status: She is alert and oriented to person, place, and time. Mental status is at baseline.  Results for orders placed or performed in visit on 07/15/19  Lipid panel  Result Value Ref Range   Cholesterol, Total 244 (H) 100 - 199 mg/dL   Triglycerides 324 (H) 0 - 149 mg/dL   HDL 37 (L) >39 mg/dL   VLDL Cholesterol Cal 65 (H) 5 - 40 mg/dL   LDL Calculated 142 (H) 0 - 99 mg/dL   Chol/HDL Ratio 6.6 (H) 0.0 - 4.4 ratio  VITAMIN D 25 Hydroxy (Vit-D Deficiency, Fractures)  Result Value Ref Range   Vit D, 25-Hydroxy 27.9 (L) 30.0 - 100.0 ng/mL  POCT glycosylated hemoglobin (Hb A1C)  Result Value Ref Range   Hemoglobin A1C 10.0 (A) 4.0 - 5.6 %   HbA1c POC (<> result, manual entry)     HbA1c, POC (prediabetic range)     HbA1c, POC (controlled diabetic range)     Est. average glucose Bld gHb Est-mCnc 240        Assessment & Plan    Problem List Items Addressed This Visit      Cardiovascular and Mediastinum   Chronic systolic heart failure (HCC)    Stable with minimal LE edema on exam Encouraged Cardiology f/u  She discontinued lasix, losartan and coreg Discussed importance of these medicaitons      Relevant Medications   rosuvastatin (CRESTOR) 5 MG tablet   Other Relevant Orders   Ambulatory referral to [facility]   HTN (hypertension) - Primary    Uncontrolled As above, encouraged resuming Coreg, losartan, lasix Patient declines at this time She believes that BP  is only elevated related to stress from her husband's poor health      Relevant Medications   rosuvastatin (CRESTOR) 5 MG tablet   Coronary artery disease    Currently asymptomatic without angina Needs to f/u with Cardiology She is not currently on an antiplatelet therapy      Relevant Medications   rosuvastatin (CRESTOR) 5 MG tablet     Endocrine   Diabetes ([place])    Uncontrolled with increasing A1c Discussed A1c goal of <7 Continue Jardiance and increase dose Continue trulicity Discussed that if it does not improve, will likely need insulin therapy Patient is very resistant to this F/u in 3 months and repeat A1c      Relevant Medications   rosuvastatin (CRESTOR) 5 MG tablet   empagliflozin (JARDIANCE) 25 MG TABS tablet   Other Relevant Orders   POCT glycosylated hemoglobin (Hb A1C) (Completed)     Nervous and Auditory   Peripheral neuropathy    2/2 T2DM Stable Continue Lyrica and Cymbalta        Musculoskeletal and Integument   Lower extremity ulceration (HCC)    Recurrent problem No signs of infection HH RN for wound care Discussed following up with [facility]      Relevant Orders   Ambulatory referral to [facility]     Other   Pure hypercholesterolemia    She reports side effects from Crestor Will decrease frequency of dosing to 3 times weekly (MWF) Discussed importance of compliance Repeat FLP and CMP      Relevant Medications   rosuvastatin (CRESTOR) 5 MG tablet   Other Relevant Orders   Lipid panel (Completed)   Urinary incontinence    Chronic nad uncontrolled Referral to URology  continue Mybetriq      Relevant Medications   mirabegron ER (MYRBETRIQ) 50 MG TB24 tablet   Other Relevant Orders   Ambulatory referral to Urology   Avitaminosis D  Recheck Vit D level  continue supplement      Relevant Orders   VITAMIN D 25 Hydroxy (Vit-D Deficiency, Fractures) (Completed)    Other Visit Diagnoses    Vagina, candidiasis            Return in about 3 months (around 10/15/2019) for chronic disease f/u.   The entirety of the information documented in the History of Present Illness, Review of Systems and Physical Exam were personally obtained by me. Portions of this information were initially documented by Tiburcio Pea and [place], CMA and reviewed by me for thoroughness and accuracy.    Brynda Heick, Dionne Bucy, MD MPH [facility]

## 2019-07-16 LAB — LIPID PANEL
Chol/HDL Ratio: 6.6 ratio — ABNORMAL HIGH (ref 0.0–4.4)
Cholesterol, Total: 244 mg/dL — ABNORMAL HIGH (ref 100–199)
HDL: 37 mg/dL — ABNORMAL LOW (ref >39)
LDL Calculated: 142 mg/dL — ABNORMAL HIGH (ref 0–99)
Triglycerides: 324 mg/dL — ABNORMAL HIGH (ref 0–149)
VLDL Cholesterol Cal: 65 mg/dL — ABNORMAL HIGH (ref 5–40)

## 2019-07-16 LAB — VITAMIN D 25 HYDROXY (VIT D DEFICIENCY, FRACTURES): Vit D, 25-Hydroxy: 27.9 ng/mL — ABNORMAL LOW (ref 30.0–100.0)

## 2019-07-16 NOTE — Assessment & Plan Note (Signed)
Uncontrolled As above, encouraged resuming Coreg, losartan, lasix Patient declines at this time She believes that BP is only elevated related to stress from her husband's poor health

## 2019-07-16 NOTE — Assessment & Plan Note (Signed)
Stable with minimal LE edema on exam Encouraged Cardiology f/u  She discontinued lasix, losartan and coreg Discussed importance of these medicaitons

## 2019-07-16 NOTE — Assessment & Plan Note (Signed)
Recheck Vit D level  continue supplement

## 2019-07-16 NOTE — Assessment & Plan Note (Signed)
Chronic nad uncontrolled Referral to URology  continue Mybetriq

## 2019-07-16 NOTE — Assessment & Plan Note (Signed)
She reports side effects from Crestor Will decrease frequency of dosing to 3 times weekly (MWF) Discussed importance of compliance Repeat FLP and CMP

## 2019-07-16 NOTE — Assessment & Plan Note (Signed)
Uncontrolled with increasing A1c Discussed A1c goal of <7 Continue Jardiance and increase dose Continue trulicity Discussed that if it does not improve, will likely need insulin therapy Patient is very resistant to this F/u in 3 months and repeat A1c

## 2019-07-16 NOTE — Assessment & Plan Note (Signed)
2/2 T2DM Stable Continue Lyrica and Cymbalta

## 2019-07-16 NOTE — Assessment & Plan Note (Signed)
Currently asymptomatic without angina Needs to f/u with Cardiology She is not currently on an antiplatelet therapy

## 2019-07-16 NOTE — Assessment & Plan Note (Signed)
Recurrent problem No signs of infection HH RN for wound care Discussed following up with [facility]

## 2019-07-17 ENCOUNTER — Telehealth: Payer: Self-pay | Admitting: Family Medicine

## 2019-07-17 NOTE — Telephone Encounter (Signed)
Pt advised that referral was given to [facility]. She was given contact information and advised that I was told their services would start 07/19/19. All of the home health care agencies are very short staffed at this time

## 2019-07-17 NOTE — Telephone Encounter (Signed)
Jenny Reichmann w/ [facility] [phone]   Delay in nursing starting care due to nurse working weekend is being tested for [place].  Nursing care may start Sunday or Monday of next week.  Thanks, [employer]

## 2019-07-17 NOTE — Telephone Encounter (Signed)
Pt calling back because she still hasn't heard from anyone coming to her home to take care of the wounds on her legs.  Pt's not sure if it should be taking this long for a call from in-home care.  Please advise.  Thanks, [employer]

## 2019-07-19 DIAGNOSIS — I2510 Atherosclerotic heart disease of native coronary artery without angina pectoris: Secondary | ICD-10-CM | POA: Diagnosis not present

## 2019-07-19 DIAGNOSIS — M069 Rheumatoid arthritis, unspecified: Secondary | ICD-10-CM | POA: Diagnosis not present

## 2019-07-19 DIAGNOSIS — I110 Hypertensive heart disease with heart failure: Secondary | ICD-10-CM | POA: Diagnosis not present

## 2019-07-19 DIAGNOSIS — E559 Vitamin D deficiency, unspecified: Secondary | ICD-10-CM | POA: Diagnosis not present

## 2019-07-19 DIAGNOSIS — L97811 Non-pressure chronic ulcer of other part of right lower leg limited to breakdown of skin: Secondary | ICD-10-CM | POA: Diagnosis not present

## 2019-07-19 DIAGNOSIS — I5022 Chronic systolic (congestive) heart failure: Secondary | ICD-10-CM | POA: Diagnosis not present

## 2019-07-19 DIAGNOSIS — E1151 Type 2 diabetes mellitus with diabetic peripheral angiopathy without gangrene: Secondary | ICD-10-CM | POA: Diagnosis not present

## 2019-07-19 DIAGNOSIS — E1142 Type 2 diabetes mellitus with diabetic polyneuropathy: Secondary | ICD-10-CM | POA: Diagnosis not present

## 2019-07-19 DIAGNOSIS — I872 Venous insufficiency (chronic) (peripheral): Secondary | ICD-10-CM | POA: Diagnosis not present

## 2019-07-20 ENCOUNTER — Telehealth: Payer: Self-pay

## 2019-07-20 MED ORDER — CEPHALEXIN 500 MG PO CAPS: 500.0000 mg | ORAL_CAPSULE | Freq: Four times a day (QID) | ORAL | 0 refills | Status: AC

## 2019-07-20 NOTE — Telephone Encounter (Signed)
Please advise 

## 2019-07-20 NOTE — Telephone Encounter (Signed)
Vit D is coming up and is very near normal now.  I suspect that stress and depression are causing the fatigue, not the Vit D.  She is on the highest supplement, so we will continue.

## 2019-07-20 NOTE — Telephone Encounter (Signed)
-----   Message from Virginia Crews, MD sent at 07/17/2019 12:14 PM EDT ----- Cholesterol is elevated, but this is to be expected due to stopping Crestor. LEt's repeat it after trying the Crestor 3 times weekly.  Vit D is low. Taking a supplement?

## 2019-07-20 NOTE — Telephone Encounter (Signed)
Keflex sent to [facility] to treat cellulitis

## 2019-07-20 NOTE — Telephone Encounter (Signed)
Noted  

## 2019-07-20 NOTE — Telephone Encounter (Signed)
Advised patient as below. Patient reports that she is taking Vit D 50,000 units once a week, and she has been consistent with this. Should she be taking an additional supplement? She reports that she has noticed that her fatigue has gotten worse. The vit d helped initially, but it doesn't help anymore. Please advise. Thank you!

## 2019-07-20 NOTE — Telephone Encounter (Signed)
OK for verbals for PT. I believe I already ordered [facility] RN for wound care. I just saw her legs last week and there was no cellulitis at that time. Has it changed significantly?  Patient had declined PT when she was in office.

## 2019-07-20 NOTE — Telephone Encounter (Signed)
Pam was given verbal orders. Pam stated there has been a significant change in patient's legs. Pam stated they are swollen and red. Pam also stated they will use collagen and support to treat legs.

## 2019-07-20 NOTE — Telephone Encounter (Signed)
Pam at [place] was advised and Patient was also advised.

## 2019-07-20 NOTE — Telephone Encounter (Signed)
[name] [name] with [facility] seen patient Sunday and wants to get an order to start nursing care.  Frequency 2 times week for 3 weeks then 1 time a week for 6 weeks   Venous insuff. Pt has ulcers on legs bilat  May need antibiotic for cellulitis.  She is also asking for PT eval and treat.  Has edema lower legs  CB#   [phone]  Thanks  [name]

## 2019-07-21 ENCOUNTER — Other Ambulatory Visit: Payer: Self-pay | Admitting: Family Medicine

## 2019-07-21 DIAGNOSIS — E559 Vitamin D deficiency, unspecified: Secondary | ICD-10-CM

## 2019-07-21 MED ORDER — VITAMIN D (ERGOCALCIFEROL) 1.25 MG (50000 UNIT) PO CAPS: 50000.0000 [IU] | ORAL_CAPSULE | ORAL | 0 refills | Status: DC

## 2019-07-21 NOTE — Telephone Encounter (Signed)
Pt needing a refill on:  Vitamin D, Ergocalciferol, (DRISDOL) 1.25 MG (50000 UT) CAPS capsule   Please fill at:  [id number] [address], [place] [phone] (Phone) [phone] (Fax)   Thanks, [employer]

## 2019-07-21 NOTE — Telephone Encounter (Signed)
Patient advised as below. Patient verbalizes understanding and is in agreement with treatment plan. Patient is requesting a new prescription for Vitamin D, she is completely out.

## 2019-07-21 NOTE — Telephone Encounter (Signed)
rx sent

## 2019-07-23 DIAGNOSIS — I110 Hypertensive heart disease with heart failure: Secondary | ICD-10-CM | POA: Diagnosis not present

## 2019-07-23 DIAGNOSIS — L97811 Non-pressure chronic ulcer of other part of right lower leg limited to breakdown of skin: Secondary | ICD-10-CM | POA: Diagnosis not present

## 2019-07-23 DIAGNOSIS — I872 Venous insufficiency (chronic) (peripheral): Secondary | ICD-10-CM | POA: Diagnosis not present

## 2019-07-23 DIAGNOSIS — E1142 Type 2 diabetes mellitus with diabetic polyneuropathy: Secondary | ICD-10-CM | POA: Diagnosis not present

## 2019-07-23 DIAGNOSIS — E559 Vitamin D deficiency, unspecified: Secondary | ICD-10-CM | POA: Diagnosis not present

## 2019-07-23 DIAGNOSIS — I5022 Chronic systolic (congestive) heart failure: Secondary | ICD-10-CM | POA: Diagnosis not present

## 2019-07-23 DIAGNOSIS — E1151 Type 2 diabetes mellitus with diabetic peripheral angiopathy without gangrene: Secondary | ICD-10-CM | POA: Diagnosis not present

## 2019-07-23 DIAGNOSIS — M069 Rheumatoid arthritis, unspecified: Secondary | ICD-10-CM | POA: Diagnosis not present

## 2019-07-23 DIAGNOSIS — I2510 Atherosclerotic heart disease of native coronary artery without angina pectoris: Secondary | ICD-10-CM | POA: Diagnosis not present

## 2019-07-27 DIAGNOSIS — I2510 Atherosclerotic heart disease of native coronary artery without angina pectoris: Secondary | ICD-10-CM | POA: Diagnosis not present

## 2019-07-27 DIAGNOSIS — E559 Vitamin D deficiency, unspecified: Secondary | ICD-10-CM | POA: Diagnosis not present

## 2019-07-27 DIAGNOSIS — I878 Other specified disorders of veins: Secondary | ICD-10-CM | POA: Diagnosis not present

## 2019-07-27 DIAGNOSIS — E1151 Type 2 diabetes mellitus with diabetic peripheral angiopathy without gangrene: Secondary | ICD-10-CM | POA: Diagnosis not present

## 2019-07-27 DIAGNOSIS — Z79899 Other long term (current) drug therapy: Secondary | ICD-10-CM | POA: Diagnosis not present

## 2019-07-27 DIAGNOSIS — I872 Venous insufficiency (chronic) (peripheral): Secondary | ICD-10-CM | POA: Diagnosis not present

## 2019-07-27 DIAGNOSIS — M069 Rheumatoid arthritis, unspecified: Secondary | ICD-10-CM | POA: Diagnosis not present

## 2019-07-27 DIAGNOSIS — L97811 Non-pressure chronic ulcer of other part of right lower leg limited to breakdown of skin: Secondary | ICD-10-CM | POA: Diagnosis not present

## 2019-07-27 DIAGNOSIS — I110 Hypertensive heart disease with heart failure: Secondary | ICD-10-CM | POA: Diagnosis not present

## 2019-07-27 DIAGNOSIS — M170 Bilateral primary osteoarthritis of knee: Secondary | ICD-10-CM | POA: Diagnosis not present

## 2019-07-27 DIAGNOSIS — M059 Rheumatoid arthritis with rheumatoid factor, unspecified: Secondary | ICD-10-CM | POA: Diagnosis not present

## 2019-07-27 DIAGNOSIS — E1142 Type 2 diabetes mellitus with diabetic polyneuropathy: Secondary | ICD-10-CM | POA: Diagnosis not present

## 2019-07-27 DIAGNOSIS — I5022 Chronic systolic (congestive) heart failure: Secondary | ICD-10-CM | POA: Diagnosis not present

## 2019-07-30 DIAGNOSIS — I2510 Atherosclerotic heart disease of native coronary artery without angina pectoris: Secondary | ICD-10-CM | POA: Diagnosis not present

## 2019-07-30 DIAGNOSIS — I5022 Chronic systolic (congestive) heart failure: Secondary | ICD-10-CM | POA: Diagnosis not present

## 2019-07-30 DIAGNOSIS — E559 Vitamin D deficiency, unspecified: Secondary | ICD-10-CM | POA: Diagnosis not present

## 2019-07-30 DIAGNOSIS — L97811 Non-pressure chronic ulcer of other part of right lower leg limited to breakdown of skin: Secondary | ICD-10-CM | POA: Diagnosis not present

## 2019-07-30 DIAGNOSIS — E1142 Type 2 diabetes mellitus with diabetic polyneuropathy: Secondary | ICD-10-CM | POA: Diagnosis not present

## 2019-07-30 DIAGNOSIS — M069 Rheumatoid arthritis, unspecified: Secondary | ICD-10-CM | POA: Diagnosis not present

## 2019-07-30 DIAGNOSIS — I872 Venous insufficiency (chronic) (peripheral): Secondary | ICD-10-CM | POA: Diagnosis not present

## 2019-07-30 DIAGNOSIS — I110 Hypertensive heart disease with heart failure: Secondary | ICD-10-CM | POA: Diagnosis not present

## 2019-07-30 DIAGNOSIS — E1151 Type 2 diabetes mellitus with diabetic peripheral angiopathy without gangrene: Secondary | ICD-10-CM | POA: Diagnosis not present

## 2019-08-03 ENCOUNTER — Encounter: Payer: Self-pay | Admitting: Family Medicine

## 2019-08-04 ENCOUNTER — Other Ambulatory Visit: Payer: Self-pay | Admitting: Family Medicine

## 2019-08-04 DIAGNOSIS — E1151 Type 2 diabetes mellitus with diabetic peripheral angiopathy without gangrene: Secondary | ICD-10-CM | POA: Diagnosis not present

## 2019-08-04 DIAGNOSIS — L97811 Non-pressure chronic ulcer of other part of right lower leg limited to breakdown of skin: Secondary | ICD-10-CM | POA: Diagnosis not present

## 2019-08-04 DIAGNOSIS — E1142 Type 2 diabetes mellitus with diabetic polyneuropathy: Secondary | ICD-10-CM | POA: Diagnosis not present

## 2019-08-04 DIAGNOSIS — I5022 Chronic systolic (congestive) heart failure: Secondary | ICD-10-CM | POA: Diagnosis not present

## 2019-08-04 DIAGNOSIS — E559 Vitamin D deficiency, unspecified: Secondary | ICD-10-CM | POA: Diagnosis not present

## 2019-08-04 DIAGNOSIS — I2510 Atherosclerotic heart disease of native coronary artery without angina pectoris: Secondary | ICD-10-CM | POA: Diagnosis not present

## 2019-08-04 DIAGNOSIS — M069 Rheumatoid arthritis, unspecified: Secondary | ICD-10-CM | POA: Diagnosis not present

## 2019-08-04 DIAGNOSIS — I872 Venous insufficiency (chronic) (peripheral): Secondary | ICD-10-CM | POA: Diagnosis not present

## 2019-08-04 DIAGNOSIS — I110 Hypertensive heart disease with heart failure: Secondary | ICD-10-CM | POA: Diagnosis not present

## 2019-08-04 MED ORDER — FLUCONAZOLE 150 MG PO TABS: 150.0000 mg | ORAL_TABLET | Freq: Once | ORAL | 0 refills | Status: AC

## 2019-08-11 DIAGNOSIS — E1151 Type 2 diabetes mellitus with diabetic peripheral angiopathy without gangrene: Secondary | ICD-10-CM | POA: Diagnosis not present

## 2019-08-11 DIAGNOSIS — I2510 Atherosclerotic heart disease of native coronary artery without angina pectoris: Secondary | ICD-10-CM | POA: Diagnosis not present

## 2019-08-11 DIAGNOSIS — I872 Venous insufficiency (chronic) (peripheral): Secondary | ICD-10-CM | POA: Diagnosis not present

## 2019-08-11 DIAGNOSIS — I5022 Chronic systolic (congestive) heart failure: Secondary | ICD-10-CM | POA: Diagnosis not present

## 2019-08-11 DIAGNOSIS — M069 Rheumatoid arthritis, unspecified: Secondary | ICD-10-CM | POA: Diagnosis not present

## 2019-08-11 DIAGNOSIS — L97811 Non-pressure chronic ulcer of other part of right lower leg limited to breakdown of skin: Secondary | ICD-10-CM | POA: Diagnosis not present

## 2019-08-11 DIAGNOSIS — I110 Hypertensive heart disease with heart failure: Secondary | ICD-10-CM | POA: Diagnosis not present

## 2019-08-11 DIAGNOSIS — M059 Rheumatoid arthritis with rheumatoid factor, unspecified: Secondary | ICD-10-CM | POA: Diagnosis not present

## 2019-08-11 DIAGNOSIS — E1142 Type 2 diabetes mellitus with diabetic polyneuropathy: Secondary | ICD-10-CM | POA: Diagnosis not present

## 2019-08-11 DIAGNOSIS — E559 Vitamin D deficiency, unspecified: Secondary | ICD-10-CM | POA: Diagnosis not present

## 2019-08-21 ENCOUNTER — Ambulatory Visit (INDEPENDENT_AMBULATORY_CARE_PROVIDER_SITE_OTHER): Payer: Medicare HMO | Admitting: Urology

## 2019-08-21 ENCOUNTER — Encounter: Payer: Self-pay | Admitting: Urology

## 2019-08-21 ENCOUNTER — Other Ambulatory Visit: Payer: Self-pay

## 2019-08-21 VITALS — Ht 68.5 in | Wt 320.0 lb

## 2019-08-21 DIAGNOSIS — N3946 Mixed incontinence: Secondary | ICD-10-CM | POA: Diagnosis not present

## 2019-08-21 NOTE — Progress Notes (Signed)
08/21/2019 2:05 PM   [name] [name] 02-03-1951 [id number]  Referring provider: Virginia Crews, MD [address] [place],  [place] [address]  Chief Complaint  Patient presents with  . Urinary Incontinence    HPI: 68 y.o. female presents for follow-up of urinary incontinence.  She has high-volume mixed incontinence and has been seeing Dr. Matilde Sprang.  He last saw her on 06/02/2018.  He had given her Myrbetriq and wanted to see her back in 6 weeks and was going to offer refractory overactive bladder therapy if she was no better.  She states her incontinence is worse with frequency and urge incontinence up to every 30 minutes.  Her urge seems to be worse than her stress incontinence.   PMH: Past Medical History:  Diagnosis Date  . Arthritis    Rhumetoid arthritis  . BRCA negative 07/2017   MyRisk neg  . Cancer ([place])    uterine  . Cellulitis of both lower extremities    Chronic  . CHF (congestive heart failure) ([place])   . Coronary artery disease   . Diabetes mellitus without complication ([place])   . Family history of breast cancer 07/2017   MyRisk neg; IBIS=10.5%/riskscore=17%  . Family history of ovarian cancer   . Fibromyalgia   . Fibromyalgia affecting shoulder region   . Hyperlipidemia   . Hypertension   . Spinal stenosis     Surgical History: Past Surgical History:  Procedure Laterality Date  . ABLATION SAPHENOUS VEIN W/ RFA    . ANAL FISSURECTOMY  01/2004  . CORONARY ANGIOPLASTY    . CYSTOSCOPY  09/03/2017   Procedure: CYSTOSCOPY;  Surgeon: Gae Dry, MD;  Location: [facility] ORS;  Service: Gynecology;;  . DILATION AND CURETTAGE OF UTERUS    . HERNIA REPAIR    . LAPAROSCOPIC HYSTERECTOMY N/A 09/03/2017   Procedure: HYSTERECTOMY TOTAL LAPAROSCOPIC BSO;  Surgeon: Gae Dry, MD;  Location: [facility] ORS;  Service: Gynecology;  Laterality: N/A;  . MEDIAL PARTIAL KNEE REPLACEMENT Left 01/06/2014  . UMBILICAL HERNIA REPAIR N/A 09/05/2015   Procedure: HERNIA REPAIR incarcerated UMBILICAL ADULT;  Surgeon: Sherri Rad, MD;  Location: [facility] ORS;  Service: General;  Laterality: N/A;  . Vascular Stent  11/04/2013    Home Medications:  Allergies as of 08/21/2019      Reactions   Latex Itching   itching   Doxycycline Rash   Sulfa Antibiotics Itching, Rash, Swelling      Medication List       Accurate as of August 21, 2019  2:05 PM. If you have any questions, ask your nurse or doctor.        STOP taking these medications   empagliflozin 25 MG Tabs tablet Commonly known as: JARDIANCE Stopped by: Abbie Sons, MD     TAKE these medications   cetirizine 10 MG tablet Commonly known as: ZYRTEC TAKE 1 TABLET (10 MG TOTAL) BY MOUTH DAILY. (NOT COVER*)   Dulaglutide 1.5 MG/0.5ML Sopn Inject 1.5 mg into the skin once a week.   DULoxetine 60 MG capsule Commonly known as: CYMBALTA Take 1 capsule (60 mg total) by mouth daily.   folic acid 1 MG tablet Commonly known as: FOLVITE Take 1 mg by mouth daily.   glucose blood test strip Commonly known as: Accu-Chek Aviva Plus To check blood sugar once a day.  DX: E11.9   INSULIN SYRINGE 1CC/29G 29G X 1/2" 1 ML Misc See admin instructions.   Methotrexate (PF) 25 MG/0.4ML Soaj Take 0.8  ml once a week for 12 weeks   methotrexate 50 MG/2ML injection TAKE 0.8 ML ONCE A WEEK FOR 12 WEEKS   mirabegron ER 50 MG Tb24 tablet Commonly known as: MYRBETRIQ Take 1 tablet (50 mg total) by mouth daily.   naproxen 500 MG tablet Commonly known as: Naprosyn Take 1 tablet (500 mg total) by mouth 2 (two) times daily with a meal.   pregabalin 75 MG capsule Commonly known as: Lyrica Take 1 capsule (75 mg total) by mouth 3 (three) times daily.   rosuvastatin 5 MG tablet Commonly known as: CRESTOR Take 1 tablet (5 mg total) by mouth 3 (three) times a week.   Vitamin D (Ergocalciferol) 1.25 MG (50000 UT) Caps capsule Commonly known as: DRISDOL Take 1 capsule (50,000 Units total) by  mouth every 7 (seven) days.       Allergies:  Allergies  Allergen Reactions  . Latex Itching    itching  . Doxycycline Rash  . Sulfa Antibiotics Itching, Rash and Swelling    Family History: Family History  Problem Relation Age of Onset  . Hypertension Mother   . Cataracts Mother   . Thyroid disease Mother   . Dementia Mother   . COPD Father   . Dementia Father   . Cataracts Father   . Aneurysm Father        Abdominal & Brain  . Diabetes Father   . Melanoma Father   . Breast cancer Sister 53  . Fibromyalgia Sister   . Migraines Sister   . Hypertension Sister   . Breast cancer Sister 48  . COPD Sister   . Ovarian cancer Sister 48  . Migraines Brother   . Heart attack Brother   . Colon cancer Paternal Uncle        60s  . Colon cancer Paternal Uncle        70s  . Uterine cancer Cousin   . Uterine cancer Cousin     Social History:  reports that she quit smoking about 22 years ago. She has a 29.00 pack-year smoking history. She has never used smokeless tobacco. She reports that she does not drink alcohol or use drugs.  ROS: UROLOGY Frequent Urination?: Yes Hard to postpone urination?: Yes Burning/pain with urination?: No Get up at night to urinate?: Yes Leakage of urine?: Yes Urine stream starts and stops?: No Trouble starting stream?: No Do you have to strain to urinate?: No Blood in urine?: No Urinary tract infection?: No Sexually transmitted disease?: No Injury to kidneys or bladder?: No Painful intercourse?: No Weak stream?: No Currently pregnant?: No Vaginal bleeding?: No Last menstrual period?: N/A  Gastrointestinal Nausea?: No Vomiting?: No Indigestion/heartburn?: No Diarrhea?: No Constipation?: Yes  Constitutional Fever: No Night sweats?: No Weight loss?: No Fatigue?: No  Skin Skin rash/lesions?: Yes Itching?: No  Eyes Blurred vision?: No Double vision?: No  Ears/Nose/Throat Sore throat?: No Sinus problems?: Yes   Hematologic/Lymphatic Swollen glands?: No Easy bruising?: No  Cardiovascular Leg swelling?: Yes Chest pain?: No  Respiratory Cough?: Yes Shortness of breath?: Yes  Endocrine Excessive thirst?: Yes  Musculoskeletal Back pain?: Yes Joint pain?: Yes  Neurological Headaches?: Yes Dizziness?: No  Psychologic Depression?: Yes Anxiety?: Yes  Physical Exam: Ht 5' 8.5" (1.74 m)   Wt (!) 320 lb (145.2 kg)   LMP  (LMP Unknown) Comment: age 65  BMI 47.95 kg/m   Constitutional:  Alert and oriented, No acute distress. HEENT: [place] AT, moist mucus membranes.  Trachea midline, no masses. Cardiovascular:   No clubbing, cyanosis, or edema. Respiratory: Normal respiratory effort, no increased work of breathing. Skin: No rashes, bruises or suspicious lesions. Neurologic: Grossly intact, no focal deficits, moving all 4 extremities. Psychiatric: Normal mood and affect.   Assessment & Plan:    - Mixed urinary incontinence Based on Dr. Mikle Bosworth last note he was going to offer refractory OAB therapy if her symptoms were no better.  Botox and InterStim or not felt to be ideal secondary to her body habitus.  I did discuss PTNS and gave her a brochure.  She wanted to make a follow-up with Dr. Matilde Sprang however I told her if she was interested in pursuing PTNS that we could get this started without a follow-up.   Abbie Sons, [place] [address] [place], [place] [address] [phone]

## 2019-08-26 ENCOUNTER — Encounter: Payer: Self-pay | Admitting: Family Medicine

## 2019-08-26 MED ORDER — FLUCONAZOLE 150 MG PO TABS: 150.0000 mg | ORAL_TABLET | Freq: Once | ORAL | 0 refills | Status: AC

## 2019-09-04 ENCOUNTER — Encounter: Payer: Self-pay | Admitting: Family Medicine

## 2019-09-04 MED ORDER — ALPRAZOLAM 0.5 MG PO TABS: 0.2500 mg | ORAL_TABLET | Freq: Every evening | ORAL | 0 refills | Status: DC | PRN

## 2019-09-28 ENCOUNTER — Encounter: Payer: Self-pay | Admitting: Urology

## 2019-09-28 ENCOUNTER — Ambulatory Visit (INDEPENDENT_AMBULATORY_CARE_PROVIDER_SITE_OTHER): Payer: Medicare HMO | Admitting: Urology

## 2019-09-28 ENCOUNTER — Other Ambulatory Visit: Payer: Self-pay

## 2019-09-28 VITALS — BP 188/114 | HR 111 | Ht 68.5 in | Wt 305.0 lb

## 2019-09-28 DIAGNOSIS — N3946 Mixed incontinence: Secondary | ICD-10-CM

## 2019-09-28 LAB — URINALYSIS, COMPLETE
Bilirubin, UA: NEGATIVE
Leukocytes,UA: NEGATIVE
Nitrite, UA: NEGATIVE
Specific Gravity, UA: >1.03 — ABNORMAL HIGH (ref 1.005–1.030)
Urobilinogen, Ur: 1 mg/dL (ref 0.2–1.0)
pH, UA: 5 (ref 5.0–7.5)

## 2019-09-28 LAB — MICROSCOPIC EXAMINATION
Epithelial Cells (non renal): >10 /hpf — ABNORMAL HIGH (ref 0–10)
RBC, Urine: >30 /hpf — ABNORMAL HIGH (ref 0–2)

## 2019-09-28 MED ORDER — OXYBUTYNIN CHLORIDE ER 10 MG PO TB24: 10.0000 mg | ORAL_TABLET | Freq: Every day | ORAL | 11 refills | Status: DC

## 2019-09-28 MED ORDER — TOLTERODINE TARTRATE ER 4 MG PO CP24: 4.0000 mg | ORAL_CAPSULE | Freq: Every day | ORAL | 11 refills | Status: DC

## 2019-09-28 NOTE — Progress Notes (Signed)
09/28/2019 2:50 PM   [name] [name] 1951/02/26 [id number]  Referring provider: Virginia Crews, MD [address] [place],  [place] [address]  Chief Complaint  Patient presents with  . Urinary Incontinence    HPI: 2019: Patient was consulted to be assessed for worsening urinary incontinence since a hysterectomy in October 2018. She had been incontinent for years previous. She leaks with coughing sneezing bending lifting a significant amount. She wears 10-15 pads a day that are soaked. She has urge incontinence. She can run water and leak without awareness. She will leak when she goes from a sitting to standing position. She has foot on the floor syndrome. She has mild bedwetting but not every night  She voids every 30 to 45 minutes and cannot hold it for 2 hours. She gets up 5-6 times a night. She has left ankle edema.  The pelvic examination was a little bit limited by her obesity. She had grade 2 hypermobility the bladder neck and a negative cough test but her bladder I believe is quite empty. She had no cystocele. She had a small rectocele. She had a large suprapubic fat pad  Patient has high-volume mixed incontinence with typical triggers. She has intermittent milder bedwetting. She has severe frequency. She has significant nocturia. If the patient ever needed a sling she would need a mini sling and not a retropubic   Last urine culture positive On urodynamics she did not void but her bladder was empty.  Her maximum bladder capacity was 100 mL.  She had sensory urgency.  The maximum detrusor pressure was 38 cm water occurring at 62 mL.  She had to void off the contraction.  She had no leakage associated with coughing reaching a pressure of 148 cm of water.  She was triggering instability.  The instability was provoked and unprovoked throughout.  During voluntary voiding she voided 76 mils of maximum voiding pressure 21 cm of water.  She emptied  efficiently.  Bladder neck descent at 1 cm.   At least 90% of the patient's problem is not overactive bladder.  If she fails medical and behavioral therapy I would be offering her a refractory overactive bladder therapy.  If she truly does have stress incontinence this could persist.  The patient does not think treating her positive culture helped her symptoms.  I will send another culture today just in case.  I will see her back in approximately 6 weeks with Myrbetriq samples and prescription.  Based upon body habitus Botox and InterStim would not be ideal  Today Frequency stable.  She saw Dr. Bernardo Heater September 2020.  She spoke about percutaneous tibial nerve stimulation and followed up with myself.  Patient actually is doing a lot better with a 40 pound weight loss.  Unfortunately she is not been eating much and her husband had passed recently.  Myrbetriq helped minimally.  She now only wears about 3 pads a day and only getting up twice at night  She is concerned about percutaneous tibial nerve stimulation because of neuropathy and a skin condition I believe.      PMH: Past Medical History:  Diagnosis Date  . Arthritis    Rhumetoid arthritis  . BRCA negative 07/2017   MyRisk neg  . Cancer ([place])    uterine  . Cellulitis of both lower extremities    Chronic  . CHF (congestive heart failure) ([place])   . Coronary artery disease   . Diabetes mellitus without complication ([place])   .  Family history of breast cancer 07/2017   MyRisk neg; IBIS=10.5%/riskscore=17%  . Family history of ovarian cancer   . Fibromyalgia   . Fibromyalgia affecting shoulder region   . Hyperlipidemia   . Hypertension   . Spinal stenosis     Surgical History: Past Surgical History:  Procedure Laterality Date  . ABLATION SAPHENOUS VEIN W/ RFA    . ANAL FISSURECTOMY  01/2004  . CORONARY ANGIOPLASTY    . CYSTOSCOPY  09/03/2017   Procedure: CYSTOSCOPY;  Surgeon: Gae Dry, MD;  Location: [facility] ORS;   Service: Gynecology;;  . DILATION AND CURETTAGE OF UTERUS    . HERNIA REPAIR    . LAPAROSCOPIC HYSTERECTOMY N/A 09/03/2017   Procedure: HYSTERECTOMY TOTAL LAPAROSCOPIC BSO;  Surgeon: Gae Dry, MD;  Location: [facility] ORS;  Service: Gynecology;  Laterality: N/A;  . MEDIAL PARTIAL KNEE REPLACEMENT Left 01/06/2014  . UMBILICAL HERNIA REPAIR N/A 09/05/2015   Procedure: HERNIA REPAIR incarcerated UMBILICAL ADULT;  Surgeon: Sherri Rad, MD;  Location: [facility] ORS;  Service: General;  Laterality: N/A;  . Vascular Stent  11/04/2013    Home Medications:  Allergies as of 09/28/2019      Reactions   Latex Itching   itching   Doxycycline Rash   Sulfa Antibiotics Itching, Rash, Swelling      Medication List       Accurate as of September 28, 2019  2:50 PM. If you have any questions, ask your nurse or doctor.        ALPRAZolam 0.5 MG tablet Commonly known as: Xanax Take 0.5-1 tablets (0.25-0.5 mg total) by mouth at bedtime as needed for anxiety.   cetirizine 10 MG tablet Commonly known as: ZYRTEC TAKE 1 TABLET (10 MG TOTAL) BY MOUTH DAILY. (NOT COVER*)   Dulaglutide 1.5 MG/0.5ML Sopn Inject 1.5 mg into the skin once a week.   DULoxetine 60 MG capsule Commonly known as: CYMBALTA Take 1 capsule (60 mg total) by mouth daily.   folic acid 1 MG tablet Commonly known as: FOLVITE Take 1 mg by mouth daily.   glucose blood test strip Commonly known as: Accu-Chek Aviva Plus To check blood sugar once a day.  DX: E11.9   INSULIN SYRINGE 1CC/29G 29G X 1/2" 1 ML Misc See admin instructions.   Methotrexate (PF) 25 MG/0.4ML Soaj Take 0.8 ml once a week for 12 weeks   methotrexate 50 MG/2ML injection TAKE 0.8 ML ONCE A WEEK FOR 12 WEEKS   mirabegron ER 50 MG Tb24 tablet Commonly known as: MYRBETRIQ Take 1 tablet (50 mg total) by mouth daily.   naproxen 500 MG tablet Commonly known as: Naprosyn Take 1 tablet (500 mg total) by mouth 2 (two) times daily with a meal.   pregabalin 75 MG  capsule Commonly known as: Lyrica Take 1 capsule (75 mg total) by mouth 3 (three) times daily.   rosuvastatin 5 MG tablet Commonly known as: CRESTOR Take 1 tablet (5 mg total) by mouth 3 (three) times a week.   Vitamin D (Ergocalciferol) 1.25 MG (50000 UT) Caps capsule Commonly known as: DRISDOL Take 1 capsule (50,000 Units total) by mouth every 7 (seven) days.       Allergies:  Allergies  Allergen Reactions  . Latex Itching    itching  . Doxycycline Rash  . Sulfa Antibiotics Itching, Rash and Swelling    Family History: Family History  Problem Relation Age of Onset  . Hypertension Mother   . Cataracts Mother   . Thyroid disease Mother   .  Dementia Mother   . COPD Father   . Dementia Father   . Cataracts Father   . Aneurysm Father        Abdominal & Brain  . Diabetes Father   . Melanoma Father   . Breast cancer Sister 26  . Fibromyalgia Sister   . Migraines Sister   . Hypertension Sister   . Breast cancer Sister 1  . COPD Sister   . Ovarian cancer Sister 47  . Migraines Brother   . Heart attack Brother   . Colon cancer Paternal Uncle        39s  . Colon cancer Paternal Uncle        31s  . Uterine cancer Cousin   . Uterine cancer Cousin     Social History:  reports that she quit smoking about 22 years ago. She has a 29.00 pack-year smoking history. She has never used smokeless tobacco. She reports that she does not drink alcohol or use drugs.  ROS:                                        Physical Exam: Ht 5' 8.5" (1.74 m)   LMP  (LMP Unknown) Comment: age 48  BMI 47.95 kg/m   Constitutional:  Alert and oriented, No acute distress.  Laboratory Data: Lab Results  Component Value Date   WBC 4.9 07/18/2018   HGB 13.4 07/18/2018   HCT 41.6 07/18/2018   MCV 82 07/18/2018   PLT 256 07/18/2018    Lab Results  Component Value Date   CREATININE 0.62 07/18/2018    No results found for: PSA  No results found for:  TESTOSTERONE  Lab Results  Component Value Date   HGBA1C 10.0 (A) 07/15/2019    Urinalysis    Component Value Date/Time   COLORURINE YELLOW (A) 09/04/2015 2209   APPEARANCEUR Clear 06/02/2018 1623   LABSPEC 1.016 09/04/2015 2209   PHURINE 7.0 09/04/2015 2209   GLUCOSEU Trace (A) 06/02/2018 1623   HGBUR NEGATIVE 09/04/2015 2209   BILIRUBINUR Negative 06/02/2018 1623   KETONESUR 1+ (A) 09/04/2015 2209   PROTEINUR Negative 06/02/2018 1623   PROTEINUR NEGATIVE 09/04/2015 2209   NITRITE Negative 06/02/2018 1623   NITRITE NEGATIVE 09/04/2015 2209   LEUKOCYTESUR Negative 06/02/2018 1623    Pertinent Imaging:   Assessment & Plan: Assessment 8 weeks on oxybutynin ER 10 mg prescription I also gave her Detrol LA 4 mg prescription.  We discussed percutaneous tibial nerve stimulation if needed.  1. Mixed stress and urge urinary incontinence  - Urinalysis, Complete   No follow-ups on file.  Reece Packer, MD  [facility] [address], [place], [place] [address] [phone]

## 2019-10-06 ENCOUNTER — Other Ambulatory Visit: Payer: Self-pay | Admitting: Family Medicine

## 2019-10-06 DIAGNOSIS — E559 Vitamin D deficiency, unspecified: Secondary | ICD-10-CM

## 2019-10-15 ENCOUNTER — Ambulatory Visit: Payer: Self-pay | Admitting: Family Medicine

## 2019-10-19 ENCOUNTER — Ambulatory Visit (INDEPENDENT_AMBULATORY_CARE_PROVIDER_SITE_OTHER): Payer: Medicare HMO | Admitting: Family Medicine

## 2019-10-19 ENCOUNTER — Other Ambulatory Visit: Payer: Self-pay

## 2019-10-19 ENCOUNTER — Encounter: Payer: Self-pay | Admitting: Family Medicine

## 2019-10-19 VITALS — BP 109/70 | HR 95 | Temp 97.1°F | Resp 18 | Ht 69.0 in | Wt 304.0 lb

## 2019-10-19 DIAGNOSIS — E1142 Type 2 diabetes mellitus with diabetic polyneuropathy: Secondary | ICD-10-CM | POA: Diagnosis not present

## 2019-10-19 DIAGNOSIS — E1169 Type 2 diabetes mellitus with other specified complication: Secondary | ICD-10-CM | POA: Diagnosis not present

## 2019-10-19 DIAGNOSIS — F4321 Adjustment disorder with depressed mood: Secondary | ICD-10-CM | POA: Diagnosis not present

## 2019-10-19 DIAGNOSIS — I10 Essential (primary) hypertension: Secondary | ICD-10-CM | POA: Diagnosis not present

## 2019-10-19 DIAGNOSIS — G63 Polyneuropathy in diseases classified elsewhere: Secondary | ICD-10-CM | POA: Diagnosis not present

## 2019-10-19 DIAGNOSIS — E785 Hyperlipidemia, unspecified: Secondary | ICD-10-CM | POA: Diagnosis not present

## 2019-10-19 MED ORDER — PREGABALIN 75 MG PO CAPS: 75.0000 mg | ORAL_CAPSULE | Freq: Three times a day (TID) | ORAL | 3 refills | Status: DC

## 2019-10-19 MED ORDER — ALPRAZOLAM 0.5 MG PO TABS: 0.2500 mg | ORAL_TABLET | Freq: Every evening | ORAL | 1 refills | Status: DC | PRN

## 2019-10-19 NOTE — Patient Instructions (Signed)
Managing Loss, Adult People experience loss in many different ways throughout their lives. Events such as moving, changing jobs, and losing friends can create a sense of loss. The loss may be as serious as a major health change, divorce, death of a pet, or death of a loved one. All of these types of loss are likely to create a physical and emotional reaction known as grief. Grief is the result of a major change or an absence of something or someone that you count on. Grief is a normal reaction to loss. A variety of factors can affect your grieving experience, including:  The nature of your loss.  Your relationship to what or whom you lost.  Your understanding of grief and how to manage it.  Your support system. How to manage lifestyle changes Keep to your normal routine as much as possible.  If you have trouble focusing or doing normal activities, it is acceptable to take some time away from your normal routine.  Spend time with friends and loved ones.  Eat a healthy diet, get plenty of sleep, and rest when you feel tired. How to recognize changes  The way that you deal with your grief will affect your ability to function as you normally do. When grieving, you may experience these changes:  Numbness, shock, sadness, anxiety, anger, denial, and guilt.  Thoughts about death.  Unexpected crying.  A physical sensation of emptiness in your stomach.  Problems sleeping and eating.  Tiredness (fatigue).  Loss of interest in normal activities.  Dreaming about or imagining seeing the person who died.  A need to remember what or whom you lost.  Difficulty thinking about anything other than your loss for a period of time.  Relief. If you have been expecting the loss for a while, you may feel a sense of relief when it happens. Follow these instructions at home:  Activity Express your feelings in healthy ways, such as:  Talking with others about your loss. It may be helpful to find  others who have had a similar loss, such as a support group.  Writing down your feelings in a journal.  Doing physical activities to release stress and emotional energy.  Doing creative activities like painting, sculpting, or playing or listening to music.  Practicing resilience. This is the ability to recover and adjust after facing challenges. Reading some resources that encourage resilience may help you to learn ways to practice those behaviors. General instructions  Be patient with yourself and others. Allow the grieving process to happen, and remember that grieving takes time. ? It is likely that you may never feel completely done with some grief. You may find a way to move on while still cherishing memories and feelings about your loss. ? Accepting your loss is a process. It can take months or longer to adjust.  Keep all follow-up visits as told by your health care provider. This is important. Where to find support To get support for managing loss:  Ask your health care provider for help and recommendations, such as grief counseling or therapy.  Think about joining a support group for people who are managing a loss. Where to find more information You can find more information about managing loss from:  American Society of Clinical Oncology: www.cancer.net  American Psychological Association: www.apa.org Contact a health care provider if:  Your grief is extreme and keeps getting worse.  You have ongoing grief that does not improve.  Your body shows symptoms of grief, such   as illness.  You feel depressed, anxious, or lonely. Get help right away if:  You have thoughts about hurting yourself or others. If you ever feel like you may hurt yourself or others, or have thoughts about taking your own life, get help right away. You can go to your nearest emergency department or call:  Your local emergency services (911 in the [place]  A suicide crisis helpline, such as the  National Suicide Prevention Lifeline at 1-800-273-8255. This is open 24 hours a day. Summary  Grief is the result of a major change or an absence of someone or something that you count on. Grief is a normal reaction to loss.  The depth of grief and the period of recovery depend on the type of loss and your ability to adjust to the change and process your feelings.  Processing grief requires patience and a willingness to accept your feelings and talk about your loss with people who are supportive.  It is important to find resources that work for you and to realize that people experience grief differently. There is not one grieving process that works for everyone in the same way.  Be aware that when grief becomes extreme, it can lead to more severe issues like isolation, depression, anxiety, or suicidal thoughts. Talk with your health care provider if you have any of these issues. This information is not intended to replace advice given to you by your health care provider. Make sure you discuss any questions you have with your health care provider. Document Released: 04/04/2017 Document Revised: 01/23/2019 Document Reviewed: 04/04/2017 Elsevier Patient Education  2020 Elsevier Inc.  

## 2019-10-19 NOTE — Progress Notes (Signed)
Patient: [name] [name] Female    DOB: 19-May-1951   68 y.o.   MRN: [id number] Visit Date: 10/19/2019  Today's Provider: Lavon Paganini, MD   Chief Complaint  Patient presents with  . Follow-up   Subjective:     HPI   Diabetes Mellitus Type II, Follow-up:   Lab Results  Component Value Date   HGBA1C 10.0 (A) 07/15/2019   HGBA1C 7.8 (A) 03/25/2019   HGBA1C 10.1 (A) 07/09/2018   Last seen for diabetes 3 months ago.  Management since then includes; increased Jardiance. She reports good compliance with treatment. She is not having side effects. none Current symptoms include none and have been unchanged. Home blood sugar records: not being checked  Episodes of hypoglycemia? no   Current Insulin Regimen: n/a Most Recent Eye Exam:   Weight trend: decreasing steadily Prior visit with dietician: no Current diet: well balanced Current exercise: none  -----------------------------------------------------------   Hypertension, follow-up:  BP Readings from Last 3 Encounters:  10/19/19 109/70  09/28/19 (!) 188/114  07/15/19 (!) 142/85    She was last seen for hypertension 3 months ago.  BP at that visit was 142/85. Management since that visit includes; no changes.She reports fair compliance with treatment. She is not having side effects. none She is not exercising. She is adherent to low salt diet.   Outside blood pressures are not checking. She is experiencing none.  Patient denies chest pain, chest pressure/discomfort, claudication, dyspnea, exertional chest pressure/discomfort, fatigue, irregular heart beat, lower extremity edema, near-syncope, orthopnea, palpitations, paroxysmal nocturnal dyspnea, syncope and tachypnea.   Cardiovascular risk factors include diabetes mellitus and dyslipidemia.  Use of agents associated with hypertension: none.   --------------------------------------------------------------------    Lipid/Cholesterol, Follow-up:    Last seen for this 3 months ago.  Management since that visit includes; labs checked, no changes.  Last Lipid Panel:    Component Value Date/Time   CHOL 244 (H) 07/15/2019 1026   TRIG 324 (H) 07/15/2019 1026   HDL 37 (L) 07/15/2019 1026   CHOLHDL 6.6 (H) 07/15/2019 1026   CHOLHDL 6 12/13/2016 1056   VLDL 50.8 (H) 12/13/2016 1056   LDLCALC 142 (H) 07/15/2019 1026   LDLDIRECT 183.0 12/13/2016 1056    She reports good compliance with treatment. She is not having side effects.   Wt Readings from Last 3 Encounters:  10/19/19 (!) 304 lb (137.9 kg)  09/28/19 (!) 305 lb (138.3 kg)  08/21/19 (!) 320 lb (145.2 kg)    -----------------------------------------------------------  Patient's husband died recently.  He had been terminally ill for a long time.  She took Xanax to help with some sleep after his death, but she has run out of this.  She states she is not sleeping well at all.  She lays awake anxious and upset.  She is still taking her Cymbalta.  Patient also misunderstood when she stopped her Jardiance and thought that she was supposed to stop Lyrica as well.  She reports increased fibromyalgia pain since stopping Lyrica.   Allergies  Allergen Reactions  . Latex Itching    itching  . Doxycycline Rash  . Sulfa Antibiotics Itching, Rash and Swelling     Current Outpatient Medications:  .  cetirizine (ZYRTEC) 10 MG tablet, TAKE 1 TABLET (10 MG TOTAL) BY MOUTH DAILY. (NOT COVER*), Disp: 90 tablet, Rfl: 3 .  Dulaglutide 1.5 MG/0.5ML SOPN, Inject 1.5 mg into the skin once a week., Disp: 12 pen, Rfl: 3 .  DULoxetine (CYMBALTA)  60 MG capsule, Take 1 capsule (60 mg total) by mouth daily., Disp: 90 capsule, Rfl: 3 .  folic acid (FOLVITE) 1 MG tablet, Take 1 mg by mouth daily., Disp: , Rfl:  .  glucose blood (ACCU-CHEK AVIVA PLUS) test strip, To check blood sugar once a day.  DX: E11.9, Disp: 100 each, Rfl: 1 .  INSULIN SYRINGE 1CC/29G 29G X 1/2" 1 ML MISC, See admin instructions.,  Disp: , Rfl:  .  methotrexate 50 MG/2ML injection, TAKE 0.8 ML ONCE A WEEK FOR 12 WEEKS, Disp: , Rfl:  .  mirabegron ER (MYRBETRIQ) 50 MG TB24 tablet, Take 1 tablet (50 mg total) by mouth daily., Disp: 30 tablet, Rfl: 11 .  naproxen (NAPROSYN) 500 MG tablet, Take 1 tablet (500 mg total) by mouth 2 (two) times daily with a meal., Disp: 180 tablet, Rfl: 1 .  rosuvastatin (CRESTOR) 5 MG tablet, Take 1 tablet (5 mg total) by mouth 3 (three) times a week., Disp: 36 tablet, Rfl: 1 .  Vitamin D, Ergocalciferol, (DRISDOL) 1.25 MG (50000 UT) CAPS capsule, TAKE 1 CAPSULE (50,000 UNITS TOTAL) BY MOUTH EVERY 7 (SEVEN) DAYS., Disp: 12 capsule, Rfl: 0 .  ALPRAZolam (XANAX) 0.5 MG tablet, Take 0.5-1 tablets (0.25-0.5 mg total) by mouth at bedtime as needed for anxiety. (Patient not taking: Reported on 10/19/2019), Disp: 30 tablet, Rfl: 0 .  Methotrexate, PF, 25 MG/0.4ML SOAJ, Take 0.8 ml once a week for 12 weeks, Disp: , Rfl:  .  oxybutynin (DITROPAN-XL) 10 MG 24 hr tablet, Take 1 tablet (10 mg total) by mouth daily. (Patient not taking: Reported on 10/19/2019), Disp: 30 tablet, Rfl: 11 .  tolterodine (DETROL LA) 4 MG 24 hr capsule, Take 1 capsule (4 mg total) by mouth daily., Disp: 30 capsule, Rfl: 11  Review of Systems  Constitutional: Positive for activity change, appetite change and fatigue.  HENT: Negative.   Respiratory: Negative.   Cardiovascular: Positive for leg swelling. Negative for chest pain and palpitations.  Gastrointestinal: Negative.   Genitourinary: Negative.   Musculoskeletal: Positive for arthralgias, back pain, joint swelling and myalgias.  Skin: Negative.   Neurological: Negative.   Psychiatric/Behavioral: Positive for decreased concentration, dysphoric mood and sleep disturbance. Negative for agitation, hallucinations, self-injury and suicidal ideas. The patient is nervous/anxious.     Social History   Tobacco Use  . Smoking status: Former Smoker    Packs/day: 1.00    Years:  29.00    Pack years: 29.00    Quit date: 12/03/1996    Years since quitting: 22.8  . Smokeless tobacco: Never Used  Substance Use Topics  . Alcohol use: No      Objective:   BP 109/70 (BP Location: Left Wrist, Patient Position: Sitting, Cuff Size: Large)   Pulse 95   Temp (!) 97.1 F (36.2 C) (Other (Comment))   Resp 18   Ht 5\' 9"  (1.753 m)   Wt (!) 304 lb (137.9 kg)   LMP  (LMP Unknown) Comment: age 35  SpO2 96%   BMI 44.89 kg/m  Vitals:   10/19/19 1113  BP: 109/70  Pulse: 95  Resp: 18  Temp: (!) 97.1 F (36.2 C)  TempSrc: Other (Comment)  SpO2: 96%  Weight: (!) 304 lb (137.9 kg)  Height: 5\' 9"  (1.753 m)  Body mass index is 44.89 kg/m.   Physical Exam Vitals signs reviewed.  Constitutional:      General: She is not in acute distress.    Appearance: Normal appearance. She is not  diaphoretic.  HENT:     Head: Normocephalic and atraumatic.  Eyes:     General: No scleral icterus.    Conjunctiva/sclera: Conjunctivae normal.  Neck:     Musculoskeletal: Neck supple.  Cardiovascular:     Rate and Rhythm: Normal rate and regular rhythm.     Pulses: Normal pulses.     Heart sounds: Normal heart sounds. No murmur.  Pulmonary:     Effort: Pulmonary effort is normal. No respiratory distress.     Breath sounds: No wheezing or rhonchi.  Abdominal:     General: There is no distension.     Palpations: Abdomen is soft.     Tenderness: There is no abdominal tenderness.  Lymphadenopathy:     Cervical: No cervical adenopathy.  Skin:    General: Skin is warm and dry.     Findings: No rash.  Neurological:     Mental Status: She is alert and oriented to person, place, and time. Mental status is at baseline.  Psychiatric:        Attention and Perception: Attention normal.        Mood and Affect: Mood is anxious and depressed. Affect is tearful.        Speech: Speech normal.        Behavior: Behavior is cooperative.        Thought Content: Thought content does not include  homicidal or suicidal ideation.      No results found for any visits on 10/19/19.     Assessment & Plan    Problem List Items Addressed This Visit      Cardiovascular and Mediastinum   HTN (hypertension)    Well controlled Continue current medications Recheck metabolic panel F/u in 3 months       Relevant Orders   CMP (Comprehensive metabolic panel) (Completed)     Endocrine   Diabetes ([place]) - Primary    Previously uncontrolled with increasing A1c Discussed A1c goal of less than 7 Jardiance was discontinued due to yeast infections Continue Trulicity Patient has been resistant to insulin therapy, but this seems to be the next step for her Repeat A1c      Relevant Orders   Hemoglobin A1c (Completed)   Hyperlipidemia associated with type 2 diabetes mellitus (HCC)    Decrease frequency of Crestor dosing to 3 times weekly at last visit Side effects are much improved since doing this Repeat lipid panel and CMP      Relevant Orders   CMP (Comprehensive metabolic panel) (Completed)   Lipid panel (Completed)     Nervous and Auditory   Peripheral neuropathy    Secondary to type 2 diabetes Worsened since stopping Lyrica Resume Lyrica and continue Cymbalta      Relevant Medications   ALPRAZolam (XANAX) 0.5 MG tablet   pregabalin (LYRICA) 75 MG capsule     Other   Morbid obesity ([place])    Discussed importance of healthy weight management Discussed diet and exercise       Grief    Patient currently grieving the death of her husband She is safe to self with no SI Continue Cymbalta at current dose Okay to take low-dose Xanax as needed Patient and I discussed how this is a temporary measure and not a long-term medication and she is in agreement with that          Return in about 3 months (around 01/19/2020) for CPE/AWV.   The entirety of the information documented in the History of  Present Illness, Review of Systems and Physical Exam were personally  obtained by me. Portions of this information were initially documented by April Ival Bible, CMA and reviewed by me for thoroughness and accuracy.    Ginger Leeth, Dionne Bucy, MD MPH [facility]

## 2019-10-20 DIAGNOSIS — F4321 Adjustment disorder with depressed mood: Secondary | ICD-10-CM | POA: Insufficient documentation

## 2019-10-20 LAB — COMPREHENSIVE METABOLIC PANEL
ALT: 11 IU/L (ref 0–32)
AST: 11 IU/L (ref 0–40)
Albumin/Globulin Ratio: 1.2 (ref 1.2–2.2)
Albumin: 3.8 g/dL (ref 3.8–4.8)
Alkaline Phosphatase: 109 IU/L (ref 39–117)
BUN/Creatinine Ratio: 14 (ref 12–28)
BUN: 9 mg/dL (ref 8–27)
Bilirubin Total: 0.3 mg/dL (ref 0.0–1.2)
CO2: 23 mmol/L (ref 20–29)
Calcium: 9 mg/dL (ref 8.7–10.3)
Chloride: 98 mmol/L (ref 96–106)
Creatinine, Ser: 0.63 mg/dL (ref 0.57–1.00)
GFR calc Af Amer: 108 mL/min/{1.73_m2} (ref >59)
GFR calc non Af Amer: 94 mL/min/{1.73_m2} (ref >59)
Globulin, Total: 3.2 g/dL (ref 1.5–4.5)
Glucose: 319 mg/dL — ABNORMAL HIGH (ref 65–99)
Potassium: 4.5 mmol/L (ref 3.5–5.2)
Sodium: 135 mmol/L (ref 134–144)
Total Protein: 7 g/dL (ref 6.0–8.5)

## 2019-10-20 LAB — HEMOGLOBIN A1C
Est. average glucose Bld gHb Est-mCnc: 252 mg/dL
Hgb A1c MFr Bld: 10.4 % — ABNORMAL HIGH (ref 4.8–5.6)

## 2019-10-20 LAB — LIPID PANEL
Chol/HDL Ratio: 6.6 ratio — ABNORMAL HIGH (ref 0.0–4.4)
Cholesterol, Total: 271 mg/dL — ABNORMAL HIGH (ref 100–199)
HDL: 41 mg/dL (ref >39)
LDL Chol Calc (NIH): 185 mg/dL — ABNORMAL HIGH (ref 0–99)
Triglycerides: 237 mg/dL — ABNORMAL HIGH (ref 0–149)
VLDL Cholesterol Cal: 45 mg/dL — ABNORMAL HIGH (ref 5–40)

## 2019-10-20 NOTE — Assessment & Plan Note (Signed)
Secondary to type 2 diabetes Worsened since stopping Lyrica Resume Lyrica and continue Cymbalta

## 2019-10-20 NOTE — Assessment & Plan Note (Signed)
Decrease frequency of Crestor dosing to 3 times weekly at last visit Side effects are much improved since doing this Repeat lipid panel and CMP

## 2019-10-20 NOTE — Assessment & Plan Note (Signed)
Patient currently grieving the death of her husband She is safe to self with no SI Continue Cymbalta at current dose Okay to take low-dose Xanax as needed Patient and I discussed how this is a temporary measure and not a long-term medication and she is in agreement with that

## 2019-10-20 NOTE — Assessment & Plan Note (Signed)
Well controlled Continue current medications Recheck metabolic panel F/u in 3 months  

## 2019-10-20 NOTE — Assessment & Plan Note (Signed)
Previously uncontrolled with increasing A1c Discussed A1c goal of less than 7 Jardiance was discontinued due to yeast infections Continue Trulicity Patient has been resistant to insulin therapy, but this seems to be the next step for her Repeat A1c

## 2019-10-20 NOTE — Assessment & Plan Note (Signed)
Discussed importance of healthy weight management Discussed diet and exercise  

## 2019-10-21 ENCOUNTER — Other Ambulatory Visit: Payer: Self-pay

## 2019-10-21 DIAGNOSIS — E1169 Type 2 diabetes mellitus with other specified complication: Secondary | ICD-10-CM

## 2019-10-21 NOTE — Telephone Encounter (Signed)
Patient advised as below. Patient agreed to taking Crestor 10 mg 3 times a week. Patient reports that she will take insulin only if no other oral medication is recommended for her.

## 2019-10-21 NOTE — Telephone Encounter (Signed)
-----   Message from Virginia Crews, MD sent at 10/21/2019 11:35 AM EST ----- Normal kidney function, liver function, electrolytes.  Cholesterol remains very elevated and uncontrolled.  A1c also remains very elevated and uncontrolled.  I again recommend insulin therapy for her diabetes.  We could start with just once daily insulin.  Is she taking the Crestor?  She should be taking this 3 times weekly.  If tolerating well, would recommend increasing to 10 mg 3 times weekly.  Okay to send new prescription if patient agrees

## 2019-10-22 MED ORDER — LANTUS SOLOSTAR 100 UNIT/ML ~~LOC~~ SOPN: 10.0000 [IU] | PEN_INJECTOR | Freq: Every day | SUBCUTANEOUS | 99 refills | Status: DC

## 2019-10-22 MED ORDER — ROSUVASTATIN CALCIUM 10 MG PO TABS: 10.0000 mg | ORAL_TABLET | ORAL | 3 refills | Status: DC

## 2019-10-22 NOTE — Telephone Encounter (Signed)
Given that she did not tolerate Jardiance and she is already on max dose of Trulicity, insulin is the option left.  We will start Lantus 10 units daily.  She can come for a nurse visit to be instructed on how to inject this, or they can show her at the pharmacy.  I did increase her Crestor to 10 mg 3 times weekly.  I would like her to start checking her fasting blood glucose every morning.  I had like to have a follow-up visit, either virtual or in person, in about a month to see if we need to increase her insulin dose at that time

## 2019-11-21 ENCOUNTER — Other Ambulatory Visit: Payer: Self-pay | Admitting: Family Medicine

## 2019-11-23 ENCOUNTER — Ambulatory Visit (INDEPENDENT_AMBULATORY_CARE_PROVIDER_SITE_OTHER): Payer: Medicare HMO | Admitting: Family Medicine

## 2019-11-23 DIAGNOSIS — E785 Hyperlipidemia, unspecified: Secondary | ICD-10-CM | POA: Diagnosis not present

## 2019-11-23 DIAGNOSIS — F331 Major depressive disorder, recurrent, moderate: Secondary | ICD-10-CM

## 2019-11-23 DIAGNOSIS — E1169 Type 2 diabetes mellitus with other specified complication: Secondary | ICD-10-CM | POA: Diagnosis not present

## 2019-11-23 DIAGNOSIS — Z794 Long term (current) use of insulin: Secondary | ICD-10-CM | POA: Diagnosis not present

## 2019-11-23 DIAGNOSIS — E1142 Type 2 diabetes mellitus with diabetic polyneuropathy: Secondary | ICD-10-CM | POA: Diagnosis not present

## 2019-11-23 DIAGNOSIS — F339 Major depressive disorder, recurrent, unspecified: Secondary | ICD-10-CM | POA: Diagnosis not present

## 2019-11-23 MED ORDER — DULOXETINE HCL 60 MG PO CPEP: 60.0000 mg | ORAL_CAPSULE | Freq: Every day | ORAL | 3 refills | Status: DC

## 2019-11-23 NOTE — Assessment & Plan Note (Signed)
Previously uncontrolled with increasing A1c Goal A1c<7 Did not tolerate Jardiance due to increased yeast infections Continue Trulicity We did start Lantus after her last visit with A1c greater than 10, but now having subjective mild hypoglycemic events Encouraged her to check her blood sugar when she is feeling this way We will decrease Lantus to 5 units daily Suspect that when her appetite picks back up, she may need more insulin again

## 2019-11-23 NOTE — Progress Notes (Signed)
Patient: [name] [name] Female    DOB: [birth year]   68 y.o.   MRN: [id number] Visit Date: 11/23/2019  Today's Provider: Lavon Paganini, MD   No chief complaint on file.  Subjective:    Virtual Visit via Video Note  I connected with [name] on 11/23/19 at  9:40 AM EST by a video enabled telemedicine application and verified that I am speaking with the correct person using two identifiers.  Location:  Patient location: home Provider location: [facility] Persons involved in the visit: patient, provider    I discussed the limitations of evaluation and management by telemedicine and the availability of in person appointments. The patient expressed understanding and agreed to proceed.    HPI   Follow up for diabetes  The patient was last seen for this 1 months ago. Changes made at last visit include start Lantus 10 units daily.  She reports excellent compliance with treatment. She feels that condition is Improved. She is having side effects. Feels "weird/woozy" intermittently during the day.  Feels like early hypoglycemia. Has not checked  Fasting BGs 128-135. ------------------------------------------------------------------------------------   Follow up for hyperlipidemia  The patient was last seen for this 1 months ago. Changes made at last visit include Crestor 10 mg 3 times a week.  She reports excellent compliance with treatment. She feels that condition is Improved. She is not having side effects.   ------------------------------------------------------------------------------------  She is going through a hard time.  With the holiday coming and her husband passed away earlier this year, she is grieving. She has lost weight while grieving and feels this is related to her lack of appetite.    Allergies  Allergen Reactions  . Latex Itching    itching  . Doxycycline Rash  . Sulfa Antibiotics Itching, Rash and Swelling      Current Outpatient Medications:  .  ALPRAZolam (XANAX) 0.5 MG tablet, Take 0.5-1 tablets (0.25-0.5 mg total) by mouth at bedtime as needed for anxiety., Disp: 30 tablet, Rfl: 1 .  cetirizine (ZYRTEC) 10 MG tablet, TAKE 1 TABLET (10 MG TOTAL) BY MOUTH DAILY. (NOT COVER*), Disp: 90 tablet, Rfl: 3 .  Dulaglutide 1.5 MG/0.5ML SOPN, Inject 1.5 mg into the skin once a week., Disp: 12 pen, Rfl: 3 .  DULoxetine (CYMBALTA) 60 MG capsule, Take 1 capsule (60 mg total) by mouth daily., Disp: 90 capsule, Rfl: 3 .  folic acid (FOLVITE) 1 MG tablet, Take 1 mg by mouth daily., Disp: , Rfl:  .  glucose blood (ACCU-CHEK AVIVA PLUS) test strip, To check blood sugar once a day.  DX: E11.9, Disp: 100 each, Rfl: 1 .  Insulin Glargine (LANTUS SOLOSTAR) 100 UNIT/ML Solostar Pen, Inject 10 Units into the skin daily., Disp: 5 pen, Rfl: PRN .  INSULIN SYRINGE 1CC/29G 29G X 1/2" 1 ML MISC, See admin instructions., Disp: , Rfl:  .  methotrexate 50 MG/2ML injection, TAKE 0.8 ML ONCE A WEEK FOR 12 WEEKS, Disp: , Rfl:  .  Methotrexate, PF, 25 MG/0.4ML SOAJ, Take 0.8 ml once a week for 12 weeks, Disp: , Rfl:  .  mirabegron ER (MYRBETRIQ) 50 MG TB24 tablet, Take 1 tablet (50 mg total) by mouth daily., Disp: 30 tablet, Rfl: 11 .  naproxen (NAPROSYN) 500 MG tablet, TAKE 1 TABLET (500 MG TOTAL) BY MOUTH 2 (TWO) TIMES DAILY WITH A MEAL., Disp: 60 tablet, Rfl: 0 .  oxybutynin (DITROPAN-XL) 10 MG 24 hr tablet, Take 1 tablet (10 mg  total) by mouth daily. (Patient not taking: Reported on 10/19/2019), Disp: 30 tablet, Rfl: 11 .  pregabalin (LYRICA) 75 MG capsule, Take 1 capsule (75 mg total) by mouth 3 (three) times daily., Disp: 90 capsule, Rfl: 3 .  rosuvastatin (CRESTOR) 10 MG tablet, Take 1 tablet (10 mg total) by mouth 3 (three) times a week., Disp: 12 tablet, Rfl: 3 .  tolterodine (DETROL LA) 4 MG 24 hr capsule, Take 1 capsule (4 mg total) by mouth daily., Disp: 30 capsule, Rfl: 11 .  Vitamin D, Ergocalciferol, (DRISDOL) 1.25  MG (50000 UT) CAPS capsule, TAKE 1 CAPSULE (50,000 UNITS TOTAL) BY MOUTH EVERY 7 (SEVEN) DAYS., Disp: 12 capsule, Rfl: 0  Review of Systems  Constitutional: Positive for appetite change. Negative for fever.  HENT: Negative.   Respiratory: Negative.   Cardiovascular: Negative.   Neurological: Negative.   Psychiatric/Behavioral: Negative.     Social History   Tobacco Use  . Smoking status: Former Smoker    Packs/day: 1.00    Years: 29.00    Pack years: 29.00    Quit date: 12/03/1996    Years since quitting: 22.9  . Smokeless tobacco: Never Used  Substance Use Topics  . Alcohol use: No      Objective:   LMP  (LMP Unknown) Comment: age 59 There were no vitals filed for this visit.There is no height or weight on file to calculate BMI.   Physical Exam Constitutional:      Appearance: Normal appearance.  HENT:     Head: Normocephalic and atraumatic.  Pulmonary:     Effort: Pulmonary effort is normal. No respiratory distress.  Neurological:     Mental Status: She is alert and oriented to person, place, and time. Mental status is at baseline.  Psychiatric:     Comments: Tearful at times      No results found for any visits on 11/23/19.     Assessment & Plan    Follow Up Instructions:    I discussed the assessment and treatment plan with the patient. The patient was provided an opportunity to ask questions and all were answered. The patient agreed with the plan and demonstrated an understanding of the instructions.   The patient was advised to call back or seek an in-person evaluation if the symptoms worsen or if the condition fails to improve as anticipated.  Problem List Items Addressed This Visit      Endocrine   Diabetes ([place]) - Primary    Previously uncontrolled with increasing A1c Goal A1c<7 Did not tolerate Jardiance due to increased yeast infections Continue Trulicity We did start Lantus after her last visit with A1c greater than 10, but now having  subjective mild hypoglycemic events Encouraged her to check her blood sugar when she is feeling this way We will decrease Lantus to 5 units daily Suspect that when her appetite picks back up, she may need more insulin again      Hyperlipidemia associated with type 2 diabetes mellitus (HCC)    Tolerating Crestor 3 times weekly Side effects are much improved        Other   MDD (major depressive disorder)    Chronic and previously well controlled Exacerbated by grief from her husband's death Continue Cymbalta 60 mg daily Contracted for safety-no SI/HI Discussed grieving process We have discussed importance of therapy Consider addition of Wellbutrin or Abilify in the future to augment Cymbalta, but she does not feel she needs additional medication at this time  Relevant Medications   DULoxetine (CYMBALTA) 60 MG capsule       Return in about 3 months (around 02/21/2020) for as scheduled, CPE/AWV.   The entirety of the information documented in the History of Present Illness, Review of Systems and Physical Exam were personally obtained by me. Portions of this information were initially documented by Ashley Royalty, CMA and reviewed by me for thoroughness and accuracy.    Chevez Sambrano, Dionne Bucy, MD MPH [facility]

## 2019-11-23 NOTE — Assessment & Plan Note (Signed)
Tolerating Crestor 3 times weekly Side effects are much improved

## 2019-11-23 NOTE — Assessment & Plan Note (Signed)
Chronic and previously well controlled Exacerbated by grief from her husband's death Continue Cymbalta 60 mg daily Contracted for safety-no SI/HI Discussed grieving process We have discussed importance of therapy Consider addition of Wellbutrin or Abilify in the future to augment Cymbalta, but she does not feel she needs additional medication at this time

## 2019-11-24 ENCOUNTER — Other Ambulatory Visit: Payer: Self-pay | Admitting: Family Medicine

## 2019-11-25 DIAGNOSIS — M059 Rheumatoid arthritis with rheumatoid factor, unspecified: Secondary | ICD-10-CM | POA: Diagnosis not present

## 2019-11-30 ENCOUNTER — Ambulatory Visit: Payer: Medicare HMO | Admitting: Urology

## 2019-12-14 ENCOUNTER — Other Ambulatory Visit: Payer: Self-pay

## 2019-12-14 ENCOUNTER — Encounter: Payer: Self-pay | Admitting: Urology

## 2019-12-14 ENCOUNTER — Ambulatory Visit: Payer: Medicare HMO | Admitting: Urology

## 2019-12-14 VITALS — BP 164/84 | HR 86 | Ht 65.0 in | Wt 304.0 lb

## 2019-12-14 DIAGNOSIS — N3946 Mixed incontinence: Secondary | ICD-10-CM | POA: Diagnosis not present

## 2019-12-14 MED ORDER — OXYBUTYNIN CHLORIDE ER 10 MG PO TB24: 10.0000 mg | ORAL_TABLET | Freq: Every day | ORAL | 11 refills | Status: DC

## 2019-12-14 NOTE — Progress Notes (Signed)
12/14/2019 10:22 AM   [name] [name] 13-Apr-1951 [id number]  Referring provider: Virginia Crews, [place],   [id number]  Chief Complaint  Patient presents with  . Follow-up    HPI: 2019: Patient was consulted to be assessed for worsening urinary incontinence since a hysterectomy in October 2018. She leaks with coughing sneezing bending lifting a significant amount. She wears 10-15 pads a day that are soaked. She has urge incontinence. She can run water and leak without awareness. She will leak when she goes from a sitting to standing position. She has foot on the floor syndrome. She has mild bedwetting but not every night  She voids every 30 to 45 minutes and cannot hold it for 2 hours. She gets up 5-6 times a night. She has left ankle edema.  The pelvic examination was a little bit limited by her obesity. She had grade 2 hypermobility the bladder neck and a negative cough test but her bladder I believe is quite empty. She had a large suprapubic fat pad  Patient has high-volume mixed incontinence with typical triggers. She has intermittent milder bedwetting. She has severe frequency. She has significant nocturia. If the patient ever needed a sling she would need a mini sling and not a retropubic  Last urine culture positive On urodynamics she did not void but her bladder was empty. Her maximum bladder capacity was 100 mL. She had sensory urgency. The maximum detrusor pressure was 38 cm water occurring at 62 mL. She had to void off the contraction. She had no leakage associatedwith coughing reaching a pressure of 148 cm of water. She was triggering instability. The instability was provoked and unprovoked throughout. During voluntary voiding she voided 76 mils of maximum voiding pressure 21 cm of water. She emptied efficiently. Bladder neck descent at 1 cm.   At least 90% of the patient's problem is not overactive bladder.  If she fails medical and behavioral therapy I would be offering her a refractory overactive bladder therapy. If she truly does have stress incontinence this could persist.  Based upon body habitus Botox and InterStim would not be ideal  She saw Dr. Bernardo Heater September 2020.  She spoke about percutaneous tibial nerve stimulation and followed up with myself.  Patient actually is doing a lot better with a 40 pound weight loss.  Unfortunately she is not been eating much and her husband had passed recently.  Myrbetriq helped minimally.  She now only wears about 3 pads a day and only getting up twice at night  She is concerned about percutaneous tibial nerve stimulation because of neuropathy and a skin condition I believe.  Assessment 8 weeks on oxybutynin ER 10 mg prescription I also gave her Detrol LA 4 mg prescription.  We discussed percutaneous tibial nerve stimulation if needed.  Today Frequency stable. Less frequency during the day.  Gets up twice instead of four times.  Three pads per day incontinence stable.  Clinically not infected   PMH: Past Medical History:  Diagnosis Date  . Arthritis    Rhumetoid arthritis  . BRCA negative 07/2017   MyRisk neg  . Cancer ([place])    uterine  . Cellulitis of both lower extremities    Chronic  . CHF (congestive heart failure) ([place])   . Coronary artery disease   . Diabetes mellitus without complication ([place])   . Family history of breast cancer 07/2017   MyRisk neg; IBIS=10.5%/riskscore=17%  . Family history of ovarian cancer   .  Fibromyalgia   . Fibromyalgia affecting shoulder region   . Hyperlipidemia   . Hypertension   . Spinal stenosis     Surgical History: Past Surgical History:  Procedure Laterality Date  . ABLATION SAPHENOUS VEIN W/ RFA    . ANAL FISSURECTOMY  01/2004  . CORONARY ANGIOPLASTY    . CYSTOSCOPY  09/03/2017   Procedure: CYSTOSCOPY;  Surgeon: Gae Dry, MD;  Location: [facility] ORS;  Service: Gynecology;;  .  DILATION AND CURETTAGE OF UTERUS    . HERNIA REPAIR    . LAPAROSCOPIC HYSTERECTOMY N/A 09/03/2017   Procedure: HYSTERECTOMY TOTAL LAPAROSCOPIC BSO;  Surgeon: Gae Dry, MD;  Location: [facility] ORS;  Service: Gynecology;  Laterality: N/A;  . MEDIAL PARTIAL KNEE REPLACEMENT Left 01/06/2014  . UMBILICAL HERNIA REPAIR N/A 09/05/2015   Procedure: HERNIA REPAIR incarcerated UMBILICAL ADULT;  Surgeon: Sherri Rad, MD;  Location: [facility] ORS;  Service: General;  Laterality: N/A;  . Vascular Stent  11/04/2013    Home Medications:  Allergies as of 12/14/2019      Reactions   Latex Itching   itching   Doxycycline Rash   Sulfa Antibiotics Itching, Rash, Swelling      Medication List       Accurate as of December 14, 2019 10:22 AM. If you have any questions, ask your nurse or doctor.        ALPRAZolam 0.5 MG tablet Commonly known as: Xanax Take 0.5-1 tablets (0.25-0.5 mg total) by mouth at bedtime as needed for anxiety.   cetirizine 10 MG tablet Commonly known as: ZYRTEC TAKE 1 TABLET (10 MG TOTAL) BY MOUTH DAILY. (NOT COVER*)   Dulaglutide 1.5 MG/0.5ML Sopn Inject 1.5 mg into the skin once a week.   DULoxetine 60 MG capsule Commonly known as: CYMBALTA Take 1 capsule (60 mg total) by mouth daily.   folic acid 1 MG tablet Commonly known as: FOLVITE Take 1 mg by mouth daily.   glucose blood test strip Commonly known as: Accu-Chek Aviva Plus To check blood sugar once a day.  DX: E11.9   INSULIN SYRINGE 1CC/29G 29G X 1/2" 1 ML Misc See admin instructions.   Lantus SoloStar 100 UNIT/ML Solostar Pen Generic drug: Insulin Glargine Inject 10 Units into the skin daily.   methotrexate 50 MG/2ML injection TAKE 0.8 ML ONCE A WEEK FOR 12 WEEKS   naproxen 500 MG tablet Commonly known as: NAPROSYN TAKE 1 TABLET (500 MG TOTAL) BY MOUTH 2 (TWO) TIMES DAILY WITH A MEAL.   pregabalin 75 MG capsule Commonly known as: Lyrica Take 1 capsule (75 mg total) by mouth 3 (three) times daily.     rosuvastatin 10 MG tablet Commonly known as: Crestor Take 1 tablet (10 mg total) by mouth 3 (three) times a week.   tolterodine 4 MG 24 hr capsule Commonly known as: Detrol LA Take 1 capsule (4 mg total) by mouth daily.   Vitamin D (Ergocalciferol) 1.25 MG (50000 UNIT) Caps capsule Commonly known as: DRISDOL TAKE 1 CAPSULE (50,000 UNITS TOTAL) BY MOUTH EVERY 7 (SEVEN) DAYS.       Allergies:  Allergies  Allergen Reactions  . Latex Itching    itching  . Doxycycline Rash  . Sulfa Antibiotics Itching, Rash and Swelling    Family History: Family History  Problem Relation Age of Onset  . Hypertension Mother   . Cataracts Mother   . Thyroid disease Mother   . Dementia Mother   . COPD Father   . Dementia Father   .  Cataracts Father   . Aneurysm Father        Abdominal & Brain  . Diabetes Father   . Melanoma Father   . Breast cancer Sister 41  . Fibromyalgia Sister   . Migraines Sister   . Hypertension Sister   . Breast cancer Sister 76  . COPD Sister   . Ovarian cancer Sister 98  . Migraines Brother   . Heart attack Brother   . Colon cancer Paternal Uncle        81s  . Colon cancer Paternal Uncle        66s  . Uterine cancer Cousin   . Uterine cancer Cousin     Social History:  reports that she quit smoking about 23 years ago. She has a 29.00 pack-year smoking history. She has never used smokeless tobacco. She reports that she does not drink alcohol or use drugs.  ROS: UROLOGY Frequent Urination?: Yes Hard to postpone urination?: Yes Burning/pain with urination?: No Get up at night to urinate?: Yes Leakage of urine?: Yes Urine stream starts and stops?: Yes Trouble starting stream?: No Do you have to strain to urinate?: No Blood in urine?: No Urinary tract infection?: No Sexually transmitted disease?: No Injury to kidneys or bladder?: No Painful intercourse?: No Weak stream?: No Currently pregnant?: No Vaginal bleeding?: No Last menstrual period?:  n  Gastrointestinal Nausea?: No Vomiting?: No Indigestion/heartburn?: No Diarrhea?: No Constipation?: Yes  Constitutional Fever: No Night sweats?: No Weight loss?: No Fatigue?: Yes  Skin Skin rash/lesions?: No Itching?: No  Eyes Blurred vision?: No Double vision?: No  Ears/Nose/Throat Sore throat?: No Sinus problems?: Yes  Hematologic/Lymphatic Swollen glands?: No Easy bruising?: No  Cardiovascular Leg swelling?: Yes Chest pain?: Yes  Respiratory Cough?: No Shortness of breath?: No  Endocrine Excessive thirst?: No  Musculoskeletal Back pain?: Yes Joint pain?: Yes  Neurological Headaches?: No Dizziness?: No  Psychologic Depression?: Yes Anxiety?: Yes  Physical Exam: BP (!) 164/84   Pulse 86   Ht '5\' 5"'$  (1.651 m)   Wt (!) 304 lb (137.9 kg)   LMP  (LMP Unknown) Comment: age 66  BMI 50.59 kg/m    Laboratory Data: Lab Results  Component Value Date   WBC 4.9 07/18/2018   HGB 13.4 07/18/2018   HCT 41.6 07/18/2018   MCV 82 07/18/2018   PLT 256 07/18/2018    Lab Results  Component Value Date   CREATININE 0.63 10/19/2019    No results found for: PSA  No results found for: TESTOSTERONE  Lab Results  Component Value Date   HGBA1C 10.4 (H) 10/19/2019    Urinalysis    Component Value Date/Time   COLORURINE YELLOW (A) 09/04/2015 2209   APPEARANCEUR Cloudy (A) 09/28/2019 1530   LABSPEC 1.016 09/04/2015 2209   PHURINE 7.0 09/04/2015 2209   GLUCOSEU Trace (A) 09/28/2019 [place] 09/04/2015 2209   BILIRUBINUR Negative 09/28/2019 1530   KETONESUR 1+ (A) 09/04/2015 2209   PROTEINUR 2+ (A) 09/28/2019 1530   PROTEINUR NEGATIVE 09/04/2015 2209   NITRITE Negative 09/28/2019 1530   NITRITE NEGATIVE 09/04/2015 2209   LEUKOCYTESUR Negative 09/28/2019 1530    Pertinent Imaging:   Assessment & Plan: Patient only tried Detrol.  She wants to try oxybutynin ER 10 mg.  Prescription sent.  Possible percutaneous tibial nerve  stimulation as add-on or replacement therapy in about 6 weeks  There are no diagnoses linked to this encounter.  No follow-ups on file.  Reece Packer, MD  [facility] [address] [place],  [address] [phone]

## 2019-12-15 ENCOUNTER — Encounter: Payer: Self-pay | Admitting: Family Medicine

## 2019-12-15 ENCOUNTER — Other Ambulatory Visit: Payer: Self-pay

## 2019-12-15 MED ORDER — DULAGLUTIDE 1.5 MG/0.5ML ~~LOC~~ SOAJ: 1.5000 mg | SUBCUTANEOUS | 3 refills | Status: DC

## 2019-12-15 MED ORDER — NAPROXEN 500 MG PO TABS: 500.0000 mg | ORAL_TABLET | Freq: Two times a day (BID) | ORAL | 0 refills | Status: DC

## 2019-12-28 ENCOUNTER — Other Ambulatory Visit: Payer: Self-pay | Admitting: Family Medicine

## 2019-12-28 DIAGNOSIS — R222 Localized swelling, mass and lump, trunk: Secondary | ICD-10-CM | POA: Insufficient documentation

## 2019-12-28 DIAGNOSIS — M059 Rheumatoid arthritis with rheumatoid factor, unspecified: Secondary | ICD-10-CM | POA: Diagnosis not present

## 2019-12-28 DIAGNOSIS — I878 Other specified disorders of veins: Secondary | ICD-10-CM | POA: Diagnosis not present

## 2019-12-28 DIAGNOSIS — E559 Vitamin D deficiency, unspecified: Secondary | ICD-10-CM

## 2019-12-28 DIAGNOSIS — Z79899 Other long term (current) drug therapy: Secondary | ICD-10-CM | POA: Diagnosis not present

## 2019-12-28 DIAGNOSIS — Z96652 Presence of left artificial knee joint: Secondary | ICD-10-CM | POA: Diagnosis not present

## 2019-12-28 DIAGNOSIS — Z471 Aftercare following joint replacement surgery: Secondary | ICD-10-CM | POA: Diagnosis not present

## 2019-12-28 DIAGNOSIS — M170 Bilateral primary osteoarthritis of knee: Secondary | ICD-10-CM | POA: Diagnosis not present

## 2019-12-28 NOTE — Telephone Encounter (Signed)
Requested medication (s) are due for refill today: yes  Requested medication (s) are on the active medication list: yes  Last refill:  10/06/2019  Future visit scheduled: yes  Notes to clinic:  not delegated   Requested Prescriptions  Pending Prescriptions Disp Refills   Vitamin D, Ergocalciferol, (DRISDOL) 1.25 MG (50000 UNIT) CAPS capsule [Pharmacy Med Name: VITAMIN D2 1.25MG (50,000 UNIT)] 12 capsule 0    Sig: TAKE 1 CAPSULE (50,000 UNITS TOTAL) BY MOUTH EVERY 7 (SEVEN) DAYS.      Endocrinology:  Vitamins - Vitamin D Supplementation Failed - 12/28/2019  1:28 AM      Failed - 50,000 IU strengths are not delegated      Failed - Phosphate in normal range and within 360 days    No results found for: PHOS        Failed - Vitamin D in normal range and within 360 days    Vit D, 25-Hydroxy  Date Value Ref Range Status  07/15/2019 27.9 (L) 30.0 - 100.0 ng/mL Final    Comment:    Vitamin D deficiency has been defined by the Institute of Medicine and an Endocrine Society practice guideline as a level of serum 25-OH vitamin D less than 20 ng/mL (1,2). The Endocrine Society went on to further define vitamin D insufficiency as a level between 21 and 29 ng/mL (2). 1. IOM (Institute of Medicine). 2010. Dietary reference    intakes for calcium and D. [place]: The    [employer]. 2. Holick MF, Binkley [place], Bischoff-Ferrari HA, et al.    Evaluation, treatment, and prevention of vitamin D    deficiency: an Endocrine Society clinical practice    guideline. JCEM. 2011 Jul; 96(7):1911-30.           Passed - Ca in normal range and within 360 days    Calcium  Date Value Ref Range Status  10/19/2019 9.0 8.7 - 10.3 mg/dL Final   Calcium, Total  Date Value Ref Range Status  08/31/2014 8.2 (L) 8.5 - 10.1 mg/dL Final          Passed - Valid encounter within last 12 months    Recent Outpatient Visits           1 month ago Type 2 diabetes mellitus with diabetic  polyneuropathy, without long-term current use of insulin ([place])   [facility] [place], Dionne Bucy, MD   2 months ago Type 2 diabetes mellitus with diabetic polyneuropathy, without long-term current use of insulin [facility])   [facility], Dionne Bucy, MD   5 months ago Essential hypertension   [place], Dionne Bucy, MD   6 months ago Chronic bilateral low back pain with bilateral sciatica   [facility] [place], Dionne Bucy, MD   7 months ago Rheumatoid arthritis involving multiple sites with positive rheumatoid factor [facility])   [place], Dionne Bucy, MD       Future Appointments             In 4 weeks MacDiarm[place], Nicki Reaper, [place]

## 2019-12-29 ENCOUNTER — Other Ambulatory Visit: Payer: Self-pay | Admitting: Family Medicine

## 2019-12-30 ENCOUNTER — Encounter: Payer: Self-pay | Admitting: Family Medicine

## 2019-12-30 NOTE — Telephone Encounter (Signed)
Can you call and get a bit more information?  Does the dental group want her to have a preop visit?  Is she going to have any sedation or anesthesia during this?

## 2019-12-31 NOTE — Telephone Encounter (Signed)
Letter printed. Please fax.

## 2020-01-07 LAB — HM DIABETES EYE EXAM

## 2020-01-09 ENCOUNTER — Other Ambulatory Visit: Payer: Self-pay | Admitting: Family Medicine

## 2020-01-09 DIAGNOSIS — E1169 Type 2 diabetes mellitus with other specified complication: Secondary | ICD-10-CM

## 2020-01-15 ENCOUNTER — Other Ambulatory Visit: Payer: Self-pay | Admitting: Family Medicine

## 2020-01-19 ENCOUNTER — Encounter: Payer: Self-pay | Admitting: Family Medicine

## 2020-01-19 DIAGNOSIS — I5022 Chronic systolic (congestive) heart failure: Secondary | ICD-10-CM

## 2020-01-25 ENCOUNTER — Encounter: Payer: Self-pay | Admitting: Family Medicine

## 2020-01-25 ENCOUNTER — Encounter: Payer: Self-pay | Admitting: Urology

## 2020-01-25 ENCOUNTER — Other Ambulatory Visit: Payer: Self-pay

## 2020-01-25 ENCOUNTER — Ambulatory Visit: Payer: Medicare HMO | Admitting: Urology

## 2020-01-25 ENCOUNTER — Other Ambulatory Visit: Payer: Self-pay | Admitting: Family Medicine

## 2020-01-25 VITALS — BP 164/84 | HR 88 | Ht 65.0 in | Wt 298.0 lb

## 2020-01-25 DIAGNOSIS — N3946 Mixed incontinence: Secondary | ICD-10-CM | POA: Diagnosis not present

## 2020-01-25 MED ORDER — TOLTERODINE TARTRATE ER 4 MG PO CP24: 4.0000 mg | ORAL_CAPSULE | Freq: Every day | ORAL | 3 refills | Status: DC

## 2020-01-25 MED ORDER — DULAGLUTIDE 1.5 MG/0.5ML ~~LOC~~ SOAJ: 1.5000 mg | SUBCUTANEOUS | 3 refills | Status: DC

## 2020-01-25 MED ORDER — NAPROXEN 500 MG PO TABS: 500.0000 mg | ORAL_TABLET | Freq: Two times a day (BID) | ORAL | 0 refills | Status: DC

## 2020-01-25 NOTE — Progress Notes (Signed)
01/25/2020 9:46 AM   [name] [birth year] [id number]  Referring provider: Virginia Crews, [address] [place],  [place] [address]  Chief Complaint  Patient presents with  . Follow-up    HPI: 2019:Patient was consulted to be assessed for worsening urinary incontinence since a hysterectomy in October 2018. She leaks with coughing sneezing bending lifting a significant amount. She wears 10-15 pads a day that are soaked. She has urge incontinence. She can run water and leak without awareness. She will leak when she goes from a sitting to standing position. She has foot on the floor syndrome. She has mild bedwetting but not every night  She voids every 30 to 45 minutes and cannot hold it for 2 hours. She gets up 5-6 times a night. She has left ankle edema.  The pelvic examination was a little bit limited by her obesity. She had grade 2 hypermobility the bladder neck and a negative cough test but her bladder I believe is quite empty. She had a large suprapubic fat pad  Patient has high-volume mixed incontinence with typical triggers. She has intermittent milder bedwetting. She has severe frequency. She has significant nocturia. If the patient ever needed a sling she would need a mini sling and not a retropubic  On urodynamics she did not void but her bladder was empty. Her maximum bladder capacity was 100 mL. She had sensory urgency. The maximum detrusor pressure was 38 cm water occurring at 62 mL. She had to void off the contraction. She had no leakage associatedwith coughing reaching a pressure of 148 cm of water. She was triggering instability. The instability was provoked and unprovoked throughout. During voluntary voiding she voided 76 mils of maximum voiding pressure 21 cm of water. She emptied efficiently. Bladder neck descent at 1 cm.   At least 90% of the patient's problem is not overactive bladder. If she fails medical and  behavioral therapy I would be offering her a refractory overactive bladder therapy. If she truly does have stress incontinence this could persist.  Based upon body habitus Botox and InterStim would not be ideal  Myrbetriq helped minimally.She now only wears about 3 pads a day and only getting up twice at night  She is concerned about percutaneous tibial nerve stimulation because of neuropathy and a skin condition I believe.  Less frequency during the day.  Gets up twice instead of four times.  Three pads per day incontinence stable.   Today Detrol works better than the oxybutynin gave side effect.  Still using 3 pads a day.  Getting up 2-4 times a day.  Overall much better.  I think we have reached the end of the algorithm.  She thought about percutaneous tibial nerve stimulation but is chosen not to.   PMH: Past Medical History:  Diagnosis Date  . Arthritis    Rhumetoid arthritis  . BRCA negative 07/2017   MyRisk neg  . Cancer ([place])    uterine  . Cellulitis of both lower extremities    Chronic  . CHF (congestive heart failure) ([place])   . Coronary artery disease   . Diabetes mellitus without complication ([place])   . Family history of breast cancer 07/2017   MyRisk neg; IBIS=10.5%/riskscore=17%  . Family history of ovarian cancer   . Fibromyalgia   . Fibromyalgia affecting shoulder region   . Hyperlipidemia   . Hypertension   . Spinal stenosis     Surgical History: Past Surgical History:  Procedure Laterality  Date  . ABLATION SAPHENOUS VEIN W/ RFA    . ANAL FISSURECTOMY  01/2004  . CORONARY ANGIOPLASTY    . CYSTOSCOPY  09/03/2017   Procedure: CYSTOSCOPY;  Surgeon: Gae Dry, MD;  Location: [facility] ORS;  Service: Gynecology;;  . DILATION AND CURETTAGE OF UTERUS    . HERNIA REPAIR    . LAPAROSCOPIC HYSTERECTOMY N/A 09/03/2017   Procedure: HYSTERECTOMY TOTAL LAPAROSCOPIC BSO;  Surgeon: Gae Dry, MD;  Location: [facility] ORS;  Service: Gynecology;  Laterality: N/A;   . MEDIAL PARTIAL KNEE REPLACEMENT Left 01/06/2014  . UMBILICAL HERNIA REPAIR N/A 09/05/2015   Procedure: HERNIA REPAIR incarcerated UMBILICAL ADULT;  Surgeon: Sherri Rad, MD;  Location: [facility] ORS;  Service: General;  Laterality: N/A;  . Vascular Stent  11/04/2013    Home Medications:  Allergies as of 01/25/2020      Reactions   Latex Itching   itching   Doxycycline Rash   Sulfa Antibiotics Itching, Rash, Swelling      Medication List       Accurate as of January 25, 2020  9:46 AM. If you have any questions, ask your nurse or doctor.        STOP taking these medications   ALPRAZolam 0.5 MG tablet Commonly known as: Xanax Stopped by: Elayne Snare Anisten Tomassi, MD   glucose blood test strip Commonly known as: Accu-Chek Aviva Plus Stopped by: Reece Packer, MD   INSULIN SYRINGE 1CC/29G 29G X 1/2" 1 ML Misc Stopped by: Reece Packer, MD   Lantus SoloStar 100 UNIT/ML Solostar Pen Generic drug: Insulin Glargine Stopped by: Reece Packer, MD   oxybutynin 10 MG 24 hr tablet Commonly known as: DITROPAN-XL Stopped by: Reece Packer, MD   rosuvastatin 10 MG tablet Commonly known as: Crestor Stopped by: Reece Packer, MD     TAKE these medications   cetirizine 10 MG tablet Commonly known as: ZYRTEC TAKE 1 TABLET (10 MG TOTAL) BY MOUTH DAILY. (NOT COVER*)   Dulaglutide 1.5 MG/0.5ML Sopn Inject 1.5 mg into the skin once a week.   DULoxetine 60 MG capsule Commonly known as: CYMBALTA Take 1 capsule (60 mg total) by mouth daily.   folic acid 1 MG tablet Commonly known as: FOLVITE Take 1 mg by mouth daily.   methotrexate 50 MG/2ML injection TAKE 0.8 ML ONCE A WEEK FOR 12 WEEKS   naproxen 500 MG tablet Commonly known as: NAPROSYN Take 1 tablet (500 mg total) by mouth 2 (two) times daily with a meal.   pregabalin 75 MG capsule Commonly known as: Lyrica Take 1 capsule (75 mg total) by mouth 3 (three) times daily.   tolterodine 4 MG 24 hr  capsule Commonly known as: Detrol LA Take 1 capsule (4 mg total) by mouth daily.   Vitamin D (Ergocalciferol) 1.25 MG (50000 UNIT) Caps capsule Commonly known as: DRISDOL TAKE 1 CAPSULE (50,000 UNITS TOTAL) BY MOUTH EVERY 7 (SEVEN) DAYS.       Allergies:  Allergies  Allergen Reactions  . Latex Itching    itching  . Doxycycline Rash  . Sulfa Antibiotics Itching, Rash and Swelling    Family History: Family History  Problem Relation Age of Onset  . Hypertension Mother   . Cataracts Mother   . Thyroid disease Mother   . Dementia Mother   . COPD Father   . Dementia Father   . Cataracts Father   . Aneurysm Father        Abdominal & Brain  .  Diabetes Father   . Melanoma Father   . Breast cancer Sister 72  . Fibromyalgia Sister   . Migraines Sister   . Hypertension Sister   . Breast cancer Sister 110  . COPD Sister   . Ovarian cancer Sister 108  . Migraines Brother   . Heart attack Brother   . Colon cancer Paternal Uncle        33s  . Colon cancer Paternal Uncle        1s  . Uterine cancer Cousin   . Uterine cancer Cousin     Social History:  reports that she quit smoking about 23 years ago. She has a 29.00 pack-year smoking history. She has never used smokeless tobacco. She reports that she does not drink alcohol or use drugs.  ROS:                                        Physical Exam: Ht '5\' 5"'  (1.651 m)   LMP  (LMP Unknown) Comment: age 6  BMI 50.59 kg/m   Constitutional:  Alert and oriented, No acute distress.  Laboratory Data: Lab Results  Component Value Date   WBC 4.9 07/18/2018   HGB 13.4 07/18/2018   HCT 41.6 07/18/2018   MCV 82 07/18/2018   PLT 256 07/18/2018    Lab Results  Component Value Date   CREATININE 0.63 10/19/2019    No results found for: PSA  No results found for: TESTOSTERONE  Lab Results  Component Value Date   HGBA1C 10.4 (H) 10/19/2019    Urinalysis    Component Value Date/Time    COLORURINE YELLOW (A) 09/04/2015 2209   APPEARANCEUR Cloudy (A) 09/28/2019 1530   LABSPEC 1.016 09/04/2015 2209   PHURINE 7.0 09/04/2015 2209   GLUCOSEU Trace (A) 09/28/2019 1530   HGBUR NEGATIVE 09/04/2015 2209   BILIRUBINUR Negative 09/28/2019 1530   KETONESUR 1+ (A) 09/04/2015 2209   PROTEINUR 2+ (A) 09/28/2019 1530   PROTEINUR NEGATIVE 09/04/2015 2209   NITRITE Negative 09/28/2019 1530   NITRITE NEGATIVE 09/04/2015 2209   LEUKOCYTESUR Negative 09/28/2019 1530    Pertinent Imaging:   Assessment & Plan: 90x3 of Detrol sent and I will see her in 1 year  There are no diagnoses linked to this encounter.  No follow-ups on file.  Reece Packer, MD  [facility] [address] [place], [place] [address] [phone]

## 2020-01-25 NOTE — Telephone Encounter (Signed)
Please advise 

## 2020-01-27 ENCOUNTER — Encounter: Payer: Self-pay | Admitting: Family Medicine

## 2020-02-01 NOTE — Progress Notes (Signed)
Subjective:   [name] [name] is a 69 y.o. female who presents for an Initial Medicare Annual Wellness Visit.    This visit is being conducted through telemedicine due to the COVID-19 pandemic. This patient has given me verbal consent via doximity to conduct this visit, patient states they are participating from their home address. Some vital signs may be absent or patient reported.    Patient identification: identified by name, DOB, and current address  Review of Systems    N/A   Cardiac Risk Factors include: advanced age (>78mn, >>78women);diabetes mellitus;dyslipidemia;hypertension;obesity (BMI >30kg/m2)     Objective:    Today's Vitals   02/02/20 1012  PainSc: 0-No pain   There is no height or weight on file to calculate BMI. Unable to obtain vitals due to visit being conducted via telephonically.   Advanced Directives 02/02/2020 11/05/2018 10/09/2018 04/10/2018 11/18/2017 11/14/2017 09/03/2017  Does Patient Have a Medical Advance Directive? Yes No No No No No No  Type of AParamedicof ADetroitLiving will - - - - - -  Does patient want to make changes to medical advance directive? - - - No - Patient declined - - -  Copy of HAltoin Chart? No - copy requested - - - - - -  Would patient like information on creating a medical advance directive? - No - Patient declined No - Patient declined - Yes (MAU/Ambulatory/Procedural Areas - Information given) No - Patient declined No - Patient declined    Current Medications (verified) Outpatient Encounter Medications as of 02/02/2020  Medication Sig  . cetirizine (ZYRTEC) 10 MG tablet TAKE 1 TABLET (10 MG TOTAL) BY MOUTH DAILY. (NOT COVER*)  . Dulaglutide 1.5 MG/0.5ML SOPN Inject 1.5 mg into the skin once a week.  . DULoxetine (CYMBALTA) 60 MG capsule Take 1 capsule (60 mg total) by mouth daily.  . folic acid (FOLVITE) 1 MG tablet Take 1 mg by mouth daily.  . methotrexate 50 MG/2ML injection  TAKE 0.8 ML ONCE A WEEK FOR 12 WEEKS  . naproxen (NAPROSYN) 500 MG tablet Take 1 tablet (500 mg total) by mouth 2 (two) times daily with a meal.  . pregabalin (LYRICA) 75 MG capsule Take 1 capsule (75 mg total) by mouth 3 (three) times daily.  .Marland Kitchentolterodine (DETROL LA) 4 MG 24 hr capsule Take 1 capsule (4 mg total) by mouth daily.  . Vitamin D, Ergocalciferol, (DRISDOL) 1.25 MG (50000 UNIT) CAPS capsule TAKE 1 CAPSULE (50,000 UNITS TOTAL) BY MOUTH EVERY 7 (SEVEN) DAYS.  . [DISCONTINUED] ALPRAZolam (XANAX) 0.5 MG tablet Take 0.5-1 tablets (0.25-0.5 mg total) by mouth at bedtime as needed for anxiety.  . [DISCONTINUED] Dulaglutide 1.5 MG/0.5ML SOPN Inject 1.5 mg into the skin once a week.  . [DISCONTINUED] DULoxetine (CYMBALTA) 60 MG capsule Take 1 capsule (60 mg total) by mouth daily.  . [DISCONTINUED] glucose blood (ACCU-CHEK AVIVA PLUS) test strip To check blood sugar once a day.  DX: E11.9  . [DISCONTINUED] INSULIN SYRINGE 1CC/29G 29G X 1/2" 1 ML MISC See admin instructions.  . [DISCONTINUED] Methotrexate, PF, 25 MG/0.4ML SOAJ Take 0.8 ml once a week for 12 weeks  . [DISCONTINUED] mirabegron ER (MYRBETRIQ) 50 MG TB24 tablet Take 1 tablet (50 mg total) by mouth daily.  . [DISCONTINUED] naproxen (NAPROSYN) 500 MG tablet Take 1 tablet (500 mg total) by mouth 2 (two) times daily with a meal.  . [DISCONTINUED] oxybutynin (DITROPAN-XL) 10 MG 24 hr tablet Take 1 tablet (  10 mg total) by mouth daily. (Patient not taking: Reported on 10/19/2019)  . [DISCONTINUED] rosuvastatin (CRESTOR) 5 MG tablet Take 1 tablet (5 mg total) by mouth 3 (three) times a week.  . [DISCONTINUED] tolterodine (DETROL LA) 4 MG 24 hr capsule Take 1 capsule (4 mg total) by mouth daily.  . [DISCONTINUED] Vitamin D, Ergocalciferol, (DRISDOL) 1.25 MG (50000 UT) CAPS capsule TAKE 1 CAPSULE (50,000 UNITS TOTAL) BY MOUTH EVERY 7 (SEVEN) DAYS.   No facility-administered encounter medications on file as of 02/02/2020.    Allergies  (verified) Latex, Doxycycline, and Sulfa antibiotics   History: Past Medical History:  Diagnosis Date  . Arthritis    Rhumetoid arthritis  . BRCA negative 07/2017   MyRisk neg  . Cancer ([place])    uterine  . Cellulitis of both lower extremities    Chronic  . CHF (congestive heart failure) ([place])   . Coronary artery disease   . Diabetes mellitus without complication ([place])   . Family history of breast cancer 07/2017   MyRisk neg; IBIS=10.5%/riskscore=17%  . Family history of ovarian cancer   . Fibromyalgia   . Fibromyalgia affecting shoulder region   . Hyperlipidemia   . Hypertension   . Spinal stenosis    Past Surgical History:  Procedure Laterality Date  . ABLATION SAPHENOUS VEIN W/ RFA    . ANAL FISSURECTOMY  01/2004  . CORONARY ANGIOPLASTY    . CYSTOSCOPY  09/03/2017   Procedure: CYSTOSCOPY;  Surgeon: Gae Dry, MD;  Location: [facility] ORS;  Service: Gynecology;;  . DILATION AND CURETTAGE OF UTERUS    . HERNIA REPAIR    . LAPAROSCOPIC HYSTERECTOMY N/A 09/03/2017   Procedure: HYSTERECTOMY TOTAL LAPAROSCOPIC BSO;  Surgeon: Gae Dry, MD;  Location: [facility] ORS;  Service: Gynecology;  Laterality: N/A;  . MEDIAL PARTIAL KNEE REPLACEMENT Left 01/06/2014  . UMBILICAL HERNIA REPAIR N/A 09/05/2015   Procedure: HERNIA REPAIR incarcerated UMBILICAL ADULT;  Surgeon: Sherri Rad, MD;  Location: [facility] ORS;  Service: General;  Laterality: N/A;  . Vascular Stent  11/04/2013   Family History  Problem Relation Age of Onset  . Hypertension Mother   . Cataracts Mother   . Thyroid disease Mother   . Dementia Mother   . COPD Father   . Dementia Father   . Cataracts Father   . Aneurysm Father        Abdominal & Brain  . Diabetes Father   . Melanoma Father   . Breast cancer Sister 68  . Fibromyalgia Sister   . Migraines Sister   . Hypertension Sister   . Breast cancer Sister 62  . COPD Sister   . Ovarian cancer Sister 87  . Migraines Brother   . Heart attack Brother   . Colon  cancer Paternal Uncle        70s  . Colon cancer Paternal Uncle        18s  . Uterine cancer Cousin   . Uterine cancer Cousin    Social History   Socioeconomic History  . Marital status: Married    Spouse name: Shanon Brow  . Number of children: 1  . Years of education: 9th Grade  . Highest education level: 9th grade  Occupational History  . Occupation: Disabled    Comment: no longer receiving  Tobacco Use  . Smoking status: Former Smoker    Packs/day: 1.00    Years: 29.00    Pack years: 29.00    Quit date: 12/03/1996    Years  since quitting: 23.1  . Smokeless tobacco: Never Used  Substance and Sexual Activity  . Alcohol use: No  . Drug use: No  . Sexual activity: Not Currently  Other Topics Concern  . Not on file  Social History Narrative  . Not on file   Social Determinants of Health   Financial Resource Strain: Low Risk   . Difficulty of Paying Living Expenses: Not hard at all  Food Insecurity: No Food Insecurity  . Worried About Charity fundraiser in the Last Year: Never true  . Ran Out of Food in the Last Year: Never true  Transportation Needs: No Transportation Needs  . Lack of Transportation (Medical): No  . Lack of Transportation (Non-Medical): No  Physical Activity: Inactive  . Days of Exercise per Week: 0 days  . Minutes of Exercise per Session: 0 min  Stress: No Stress Concern Present  . Feeling of Stress : Only a little  Social Connections: Moderately Isolated  . Frequency of Communication with Friends and Family: More than three times a week  . Frequency of Social Gatherings with Friends and Family: More than three times a week  . Attends Religious Services: Never  . Active Member of Clubs or Organizations: No  . Attends Archivist Meetings: Never  . Marital Status: Widowed    Tobacco Counseling Counseling given: Not Answered   Clinical Intake:  Pre-visit preparation completed: Yes  Pain : No/denies pain Pain Score: 0-No pain      Nutritional Risks: None Diabetes: Yes  How often do you need to have someone help you when you read instructions, pamphlets, or other written materials from your doctor or pharmacy?: 1 - Never   Diabetes:  Is the patient diabetic?  Yes  If diabetic, was a CBG obtained today?  No  Did the patient bring in their glucometer from home?  No  How often do you monitor your CBG's? Two to three times a day.   Financial Strains and Diabetes Management:  Are you having any financial strains with the device, your supplies or your medication? No .  Does the patient want to be seen by Chronic Care Management for management of their diabetes?  No  Would the patient like to be referred to a Nutritionist or for Diabetic Management?  No   Diabetic Exams:  Diabetic Eye Exam: Completed 01/2020. Requested records to be sent to clinic to update chart.  Diabetic Foot Exam: Completed 08/15/17. Pt has been advised about the importance in completing this exam. Note made to follow up on this at next in office apt.   Interpreter Needed?: No  Information entered by :: [facility], LPN   Activities of Daily Living In your present state of health, do you have any difficulty performing the following activities: 02/02/2020  Hearing? N  Vision? N  Difficulty concentrating or making decisions? N  Walking or climbing stairs? N  Dressing or bathing? N  Doing errands, shopping? N  Preparing Food and eating ? N  Using the Toilet? N  In the past six months, have you accidently leaked urine? Y  Comment Due to overactive bladder. Currently seeing a urologist for this.  Do you have problems with loss of bowel control? N  Managing your Medications? N  Managing your Finances? N  Housekeeping or managing your Housekeeping? N  Some recent data might be hidden     Immunizations and Health Maintenance Immunization History  Administered Date(s) Administered  . Influenza,inj,Quad PF,6+ Mos 09/06/2015,  12/13/2016   . Pneumococcal Polysaccharide-23 12/13/2016   Health Maintenance Due  Topic Date Due  . MAMMOGRAM  09/21/2003  . COLONOSCOPY  09/21/2003  . OPHTHALMOLOGY EXAM  01/04/2017  . FOOT EXAM  08/15/2018  . URINE MICROALBUMIN  08/15/2018  . DEXA SCAN  09/20/2018    Patient Care Team: Virginia Crews, MD as PCP - General (Family Medicine) Bjorn Loser, MD as Consulting Physician (Urology) Marlowe Sax, MD as Referring Physician (Internal Medicine)  Indicate any recent Medical Services you may have received from other than [facility] providers in the past year (date may be approximate).     Assessment:   This is a routine wellness examination for [name]  Hearing/Vision screen No exam data present  Dietary issues and exercise activities discussed: Current Exercise Habits: The patient does not participate in regular exercise at present, Exercise limited by: None identified  Goals    . Exercise 3x per week (30 min per time)     Recommend to start walking 3 days a week for at least 30 minutes at a time.       Depression Screen PHQ 2/9 Scores 02/02/2020 04/16/2019 11/05/2018 07/08/2018 04/10/2018 01/22/2018 12/17/2017  PHQ - 2 Score 1 2 0 0 4 0 0  PHQ- 9 Score - 8 - - 9 - -  Exception Documentation - - - - - - Patient refusal    Fall Risk Fall Risk  02/02/2020 11/05/2018 10/09/2018 07/08/2018 04/10/2018  Falls in the past year? 0 0 0 No No  Number falls in past yr: 0 - - - -  Injury with Fall? 0 - - - -    FALL RISK PREVENTION PERTAINING TO THE HOME:  Any stairs in or around the home? Yes  If so, are there any without handrails? No   Home free of loose throw rugs in walkways, pet beds, electrical cords, etc? Yes  Adequate lighting in your home to reduce risk of falls? Yes   ASSISTIVE DEVICES UTILIZED TO PREVENT FALLS:  Life alert? No  Use of a cane, walker or w/c? No  Grab bars in the bathroom? Yes  Shower chair or bench in shower? No  Elevated toilet seat or a  handicapped toilet? Yes    TIMED UP AND GO:  Was the test performed? No .     Cognitive Function:     6CIT Screen 02/02/2020  What Year? 0 points  What month? 0 points  What time? 0 points  Count back from 20 0 points  Months in reverse 0 points  Repeat phrase 0 points  Total Score 0    Screening Tests Health Maintenance  Topic Date Due  . MAMMOGRAM  09/21/2003  . COLONOSCOPY  09/21/2003  . OPHTHALMOLOGY EXAM  01/04/2017  . FOOT EXAM  08/15/2018  . URINE MICROALBUMIN  08/15/2018  . DEXA SCAN  09/20/2018  . INFLUENZA VACCINE  07/03/2020 (Originally 07/04/2019)  . TETANUS/TDAP  02/01/2021 (Originally [birth year]/1972)  . PNA vac Low Risk Adult (1 of 2 - PCV13) 02/01/2021 (Originally 09/20/2018)  . HEMOGLOBIN A1C  04/17/2020  . Hepatitis C Screening  Completed    Qualifies for Shingles Vaccine? Yes . Due for Shingrix. Pt has been advised to call insurance company to determine out of pocket expense. Advised may also receive vaccine at local pharmacy or Health Dept. Verbalized acceptance and understanding.  Tdap: Although this vaccine is not a covered service during a Wellness Exam, does the patient still wish to receive this vaccine today?  No . Advised may receive this vaccine at local pharmacy or Health Dept. Aware to provide a copy of the vaccination record if obtained from local pharmacy or Health Dept. Verbalized acceptance and understanding.  Flu Vaccine: Due for Flu vaccine. Does the patient want to receive this vaccine today?  No . Advised may receive this vaccine at local pharmacy or Health Dept. Aware to provide a copy of the vaccination record if obtained from local pharmacy or Health Dept. Verbalized acceptance and understanding.  Pneumococcal Vaccine: Due for Pneumococcal vaccine. Does the patient want to receive this vaccine today?  No . Advised may receive this vaccine at local pharmacy or Health Dept. Aware to provide a copy of the vaccination record if obtained from  local pharmacy or Health Dept. Verbalized acceptance and understanding.   Cancer Screenings:  Colorectal Screening: Currently due. Referral to GI placed today. Pt aware the office will call re: appt.  Mammogram: Currently due. Ordered today. Pt provided with contact info and advised to call to schedule appt.   Bone Density: Currently due. Ordered today. Pt aware the office will call re: appt.  Lung Cancer Screening: (Low Dose CT Chest recommended if Age 35-80 years, 30 pack-year currently smoking OR have quit w/in 15years.) does not qualify.   Additional Screening:  Hepatitis C Screening: Up to date   Dental Screening: Recommended annual dental exams for proper oral hygiene  Community Resource Referral:  CRR required this visit?  No       Plan:  I have personally reviewed and addressed the Medicare Annual Wellness questionnaire and have noted the following in the patient's chart:  A. Medical and social history B. Use of alcohol, tobacco or illicit drugs  C. Current medications and supplements D. Functional ability and status E.  Nutritional status F.  Physical activity G. Advance directives H. List of other physicians I.  Hospitalizations, surgeries, and ER visits in previous 12 months J.  [place] such as hearing and vision if needed, cognitive and depression L. Referrals and appointments   In addition, I have reviewed and discussed with patient certain preventive protocols, quality metrics, and best practice recommendations. A written personalized care plan for preventive services as well as general preventive health recommendations were provided to patient.   [place] Score, [place]   [id number]  Nurse Health Advisor    Nurse Notes: Pt needs a diabetic foot exam and urine check at tomorrow's in office visit. Pt declined receiving any future vaccinations. Requested eye exam notes from 2/21 to update chart.

## 2020-02-02 ENCOUNTER — Other Ambulatory Visit: Payer: Self-pay

## 2020-02-02 ENCOUNTER — Ambulatory Visit (INDEPENDENT_AMBULATORY_CARE_PROVIDER_SITE_OTHER): Payer: Medicare Other

## 2020-02-02 DIAGNOSIS — E2839 Other primary ovarian failure: Secondary | ICD-10-CM | POA: Diagnosis not present

## 2020-02-02 DIAGNOSIS — Z1211 Encounter for screening for malignant neoplasm of colon: Secondary | ICD-10-CM | POA: Diagnosis not present

## 2020-02-02 DIAGNOSIS — Z0000 Encounter for general adult medical examination without abnormal findings: Secondary | ICD-10-CM | POA: Diagnosis not present

## 2020-02-02 DIAGNOSIS — Z1231 Encounter for screening mammogram for malignant neoplasm of breast: Secondary | ICD-10-CM | POA: Diagnosis not present

## 2020-02-02 NOTE — Patient Instructions (Signed)
[name] [name] , Thank you for taking time to come for your Medicare Wellness Visit. I appreciate your ongoing commitment to your health goals. Please review the following plan we discussed and let me know if I can assist you in the future.   Screening recommendations/referrals: Colonoscopy: Currently due. Referral to GI placed today. Pt aware the office will call re: appt. Mammogram: Currently due. Ordered today. Pt provided with contact info and advised to call to schedule appt.  Bone Density: Currently due. Ordered today. Pt aware the office will call re: appt. Recommended yearly ophthalmology/optometry visit for glaucoma screening and checkup Recommended yearly dental visit for hygiene and checkup  Vaccinations: Influenza vaccine: Pt declines today.  Pneumococcal vaccine: Pt declines today.  Tdap vaccine: Pt declines today.  Shingles vaccine: Pt declines today.     Advanced directives: Please bring a copy of your POA (Power of Attorney) and/or Living Will to your next appointment.   Conditions/risks identified: Recommend to start walking 3 days a week for at least 30 minutes at a time.  Next appointment: 02/03/20 @ 10:00 AM with Dr Brita Romp    Preventive Care 18 Years and Older, Female Preventive care refers to lifestyle choices and visits with your health care provider that can promote health and wellness. What does preventive care include?  A yearly physical exam. This is also called an annual well check.  Dental exams once or twice a year.  Routine eye exams. Ask your health care provider how often you should have your eyes checked.  Personal lifestyle choices, including:  Daily care of your teeth and gums.  Regular physical activity.  Eating a healthy diet.  Avoiding tobacco and drug use.  Limiting alcohol use.  Practicing safe sex.  Taking low-dose aspirin every day.  Taking vitamin and mineral supplements as recommended by your health care provider. What  happens during an annual well check? The services and screenings done by your health care provider during your annual well check will depend on your age, overall health, lifestyle risk factors, and family history of disease. Counseling  Your health care provider may ask you questions about your:  Alcohol use.  Tobacco use.  Drug use.  Emotional well-being.  Home and relationship well-being.  Sexual activity.  Eating habits.  History of falls.  Memory and ability to understand (cognition).  Work and work Statistician.  Reproductive health. Screening  You may have the following tests or measurements:  Height, weight, and BMI.  Blood pressure.  Lipid and cholesterol levels. These may be checked every 5 years, or more frequently if you are over 7 years old.  Skin check.  Lung cancer screening. You may have this screening every year starting at age 38 if you have a 30-pack-year history of smoking and currently smoke or have quit within the past 15 years.  Fecal occult blood test (FOBT) of the stool. You may have this test every year starting at age 35.  Flexible sigmoidoscopy or colonoscopy. You may have a sigmoidoscopy every 5 years or a colonoscopy every 10 years starting at age 64.  Hepatitis C blood test.  Hepatitis B blood test.  Sexually transmitted disease (STD) testing.  Diabetes screening. This is done by checking your blood sugar (glucose) after you have not eaten for a while (fasting). You may have this done every 1-3 years.  Bone density scan. This is done to screen for osteoporosis. You may have this done starting at age 9.  Mammogram. This may be done  every 1-2 years. Talk to your health care provider about how often you should have regular mammograms. Talk with your health care provider about your test results, treatment options, and if necessary, the need for more tests. Vaccines  Your health care provider may recommend certain vaccines, such  as:  Influenza vaccine. This is recommended every year.  Tetanus, diphtheria, and acellular pertussis (Tdap, Td) vaccine. You may need a Td booster every 10 years.  Zoster vaccine. You may need this after age 65.  Pneumococcal 13-valent conjugate (PCV13) vaccine. One dose is recommended after age 78.  Pneumococcal polysaccharide (PPSV23) vaccine. One dose is recommended after age 56. Talk to your health care provider about which screenings and vaccines you need and how often you need them. This information is not intended to replace advice given to you by your health care provider. Make sure you discuss any questions you have with your health care provider. Document Released: 12/16/2015 Document Revised: 08/08/2016 Document Reviewed: 09/20/2015 Elsevier Interactive Patient Education  2017 [employer] Prevention in the Home Falls can cause injuries. They can happen to people of all ages. There are many things you can do to make your home safe and to help prevent falls. What can I do on the outside of my home?  Regularly fix the edges of walkways and driveways and fix any cracks.  Remove anything that might make you trip as you walk through a door, such as a raised step or threshold.  Trim any bushes or trees on the path to your home.  Use bright outdoor lighting.  Clear any walking paths of anything that might make someone trip, such as rocks or tools.  Regularly check to see if handrails are loose or broken. Make sure that both sides of any steps have handrails.  Any raised decks and porches should have guardrails on the edges.  Have any leaves, snow, or ice cleared regularly.  Use sand or salt on walking paths during winter.  Clean up any spills in your garage right away. This includes oil or grease spills. What can I do in the bathroom?  Use night lights.  Install grab bars by the toilet and in the tub and shower. Do not use towel bars as grab bars.  Use  non-skid mats or decals in the tub or shower.  If you need to sit down in the shower, use a plastic, non-slip stool.  Keep the floor dry. Clean up any water that spills on the floor as soon as it happens.  Remove soap buildup in the tub or shower regularly.  Attach bath mats securely with double-sided non-slip rug tape.  Do not have throw rugs and other things on the floor that can make you trip. What can I do in the bedroom?  Use night lights.  Make sure that you have a light by your bed that is easy to reach.  Do not use any sheets or blankets that are too big for your bed. They should not hang down onto the floor.  Have a firm chair that has side arms. You can use this for support while you get dressed.  Do not have throw rugs and other things on the floor that can make you trip. What can I do in the kitchen?  Clean up any spills right away.  Avoid walking on wet floors.  Keep items that you use a lot in easy-to-reach places.  If you need to reach something above you, use a  strong step stool that has a grab bar.  Keep electrical cords out of the way.  Do not use floor polish or wax that makes floors slippery. If you must use wax, use non-skid floor wax.  Do not have throw rugs and other things on the floor that can make you trip. What can I do with my stairs?  Do not leave any items on the stairs.  Make sure that there are handrails on both sides of the stairs and use them. Fix handrails that are broken or loose. Make sure that handrails are as long as the stairways.  Check any carpeting to make sure that it is firmly attached to the stairs. Fix any carpet that is loose or worn.  Avoid having throw rugs at the top or bottom of the stairs. If you do have throw rugs, attach them to the floor with carpet tape.  Make sure that you have a light switch at the top of the stairs and the bottom of the stairs. If you do not have them, ask someone to add them for you. What  else can I do to help prevent falls?  Wear shoes that:  Do not have high heels.  Have rubber bottoms.  Are comfortable and fit you well.  Are closed at the toe. Do not wear sandals.  If you use a stepladder:  Make sure that it is fully opened. Do not climb a closed stepladder.  Make sure that both sides of the stepladder are locked into place.  Ask someone to hold it for you, if possible.  Clearly mark and make sure that you can see:  Any grab bars or handrails.  First and last steps.  Where the edge of each step is.  Use tools that help you move around (mobility aids) if they are needed. These include:  Canes.  Walkers.  Scooters.  Crutches.  Turn on the lights when you go into a dark area. Replace any light bulbs as soon as they burn out.  Set up your furniture so you have a clear path. Avoid moving your furniture around.  If any of your floors are uneven, fix them.  If there are any pets around you, be aware of where they are.  Review your medicines with your doctor. Some medicines can make you feel dizzy. This can increase your chance of falling. Ask your doctor what other things that you can do to help prevent falls. This information is not intended to replace advice given to you by your health care provider. Make sure you discuss any questions you have with your health care provider. Document Released: 09/15/2009 Document Revised: 04/26/2016 Document Reviewed: 12/24/2014 Elsevier Interactive Patient Education  2017 [employer].

## 2020-02-03 ENCOUNTER — Encounter: Payer: Self-pay | Admitting: Family Medicine

## 2020-02-03 ENCOUNTER — Ambulatory Visit (INDEPENDENT_AMBULATORY_CARE_PROVIDER_SITE_OTHER): Payer: Medicare Other | Admitting: Family Medicine

## 2020-02-03 ENCOUNTER — Other Ambulatory Visit: Payer: Self-pay

## 2020-02-03 ENCOUNTER — Telehealth: Payer: Self-pay

## 2020-02-03 VITALS — BP 142/82 | HR 89 | Temp 97.1°F | Resp 16 | Ht 68.0 in | Wt 297.4 lb

## 2020-02-03 DIAGNOSIS — Z79899 Other long term (current) drug therapy: Secondary | ICD-10-CM | POA: Diagnosis not present

## 2020-02-03 DIAGNOSIS — I10 Essential (primary) hypertension: Secondary | ICD-10-CM | POA: Diagnosis not present

## 2020-02-03 DIAGNOSIS — E1169 Type 2 diabetes mellitus with other specified complication: Secondary | ICD-10-CM

## 2020-02-03 DIAGNOSIS — I83003 Varicose veins of unspecified lower extremity with ulcer of ankle: Secondary | ICD-10-CM

## 2020-02-03 DIAGNOSIS — E1142 Type 2 diabetes mellitus with diabetic polyneuropathy: Secondary | ICD-10-CM

## 2020-02-03 DIAGNOSIS — E785 Hyperlipidemia, unspecified: Secondary | ICD-10-CM

## 2020-02-03 DIAGNOSIS — I5022 Chronic systolic (congestive) heart failure: Secondary | ICD-10-CM

## 2020-02-03 DIAGNOSIS — L97302 Non-pressure chronic ulcer of unspecified ankle with fat layer exposed: Secondary | ICD-10-CM

## 2020-02-03 DIAGNOSIS — M0579 Rheumatoid arthritis with rheumatoid factor of multiple sites without organ or systems involvement: Secondary | ICD-10-CM

## 2020-02-03 DIAGNOSIS — Z0000 Encounter for general adult medical examination without abnormal findings: Secondary | ICD-10-CM | POA: Diagnosis not present

## 2020-02-03 DIAGNOSIS — E559 Vitamin D deficiency, unspecified: Secondary | ICD-10-CM | POA: Diagnosis not present

## 2020-02-03 DIAGNOSIS — G63 Polyneuropathy in diseases classified elsewhere: Secondary | ICD-10-CM

## 2020-02-03 DIAGNOSIS — F331 Major depressive disorder, recurrent, moderate: Secondary | ICD-10-CM | POA: Insufficient documentation

## 2020-02-03 DIAGNOSIS — L97902 Non-pressure chronic ulcer of unspecified part of unspecified lower leg with fat layer exposed: Secondary | ICD-10-CM

## 2020-02-03 LAB — POCT UA - MICROALBUMIN: Microalbumin Ur, POC: 100 mg/L

## 2020-02-03 MED ORDER — LOSARTAN POTASSIUM 50 MG PO TABS: 50.0000 mg | ORAL_TABLET | Freq: Every day | ORAL | 3 refills | Status: DC

## 2020-02-03 MED ORDER — FREESTYLE LIBRE 14 DAY READER DEVI: 1.0000 | 1 refills | Status: DC

## 2020-02-03 MED ORDER — DULAGLUTIDE 1.5 MG/0.5ML ~~LOC~~ SOAJ: 1.5000 mg | SUBCUTANEOUS | 3 refills | Status: DC

## 2020-02-03 MED ORDER — FREESTYLE LIBRE 14 DAY SENSOR MISC: 1.0000 | 3 refills | Status: DC

## 2020-02-03 NOTE — Assessment & Plan Note (Signed)
Previously well controlled She is now off of her Lasix, losartan, Coreg She reports good control at home, though blood pressure is elevated today Advised her to continue to monitor and we will follow-up at next visit, but we likely need to consider resuming losartan or Coreg

## 2020-02-03 NOTE — Progress Notes (Signed)
Patient: [name] [name], Female    DOB: [birth year], 69 y.o.   MRN: [id number] Visit Date: 02/03/2020  Today's Provider: Lavon Paganini, MD   Chief Complaint  Patient presents with  . Annual Exam   Subjective:     Complete Physical [name] [name] is a 69 y.o. female. She feels fairly well. She reports exercising no. She reports she is sleeping well. 02/02/2020 AWV with [facility] 07/31/2017 Pap/HPV-negative  Wt Readings from Last 3 Encounters:  02/03/20 297 lb 6.4 oz (134.9 kg)  01/25/20 298 lb (135.2 kg)  12/14/19 (!) 304 lb (137.9 kg)   Has mammogram, DEXA, and colonoscopy upcoming in April ----------------------------------------------------------- She has been able to lose >50 lbs in last 6 months.  This is partially related to effort and also related to grieving.  With the weight loss, she has not needed her cane in 3 months  She has been out of Trulicity for a few weeks due to a pharmacy error.  Had recent eye exam at [facility] in [place]  Constitutional: Negative.   HENT: Negative.   Eyes: Negative.   Respiratory: Negative.   Cardiovascular: Negative.   Gastrointestinal: Negative.   Endocrine: Negative.   Genitourinary: Negative.   Musculoskeletal: Negative.   Skin: Negative.   Allergic/Immunologic: Negative.   Neurological: Negative.   Hematological: Negative.   Psychiatric/Behavioral: Negative.     Social History   Socioeconomic History  . Marital status: Married    Spouse name: Shanon Brow  . Number of children: 1  . Years of education: 9th Grade  . Highest education level: 9th grade  Occupational History  . Occupation: Disabled    Comment: no longer receiving  Tobacco Use  . Smoking status: Former Smoker    Packs/day: 1.00    Years: 29.00    Pack years: 29.00    Quit date: 12/03/1996    Years since quitting: 23.1  . Smokeless tobacco: Never Used  Substance and Sexual Activity  . Alcohol use: No  . Drug use: No  .  Sexual activity: Not Currently  Other Topics Concern  . Not on file  Social History Narrative  . Not on file   Social Determinants of Health   Financial Resource Strain: Low Risk   . Difficulty of Paying Living Expenses: Not hard at all  Food Insecurity: No Food Insecurity  . Worried About Charity fundraiser in the Last Year: Never true  . Ran Out of Food in the Last Year: Never true  Transportation Needs: No Transportation Needs  . Lack of Transportation (Medical): No  . Lack of Transportation (Non-Medical): No  Physical Activity: Inactive  . Days of Exercise per Week: 0 days  . Minutes of Exercise per Session: 0 min  Stress: No Stress Concern Present  . Feeling of Stress : Only a little  Social Connections: Moderately Isolated  . Frequency of Communication with Friends and Family: More than three times a week  . Frequency of Social Gatherings with Friends and Family: More than three times a week  . Attends Religious Services: Never  . Active Member of Clubs or Organizations: No  . Attends Archivist Meetings: Never  . Marital Status: Widowed  Intimate Partner Violence: Not At Risk  . Fear of Current or Ex-Partner: No  . Emotionally Abused: No  . Physically Abused: No  . Sexually Abused: No    Past Medical History:  Diagnosis Date  . Arthritis  Rhumetoid arthritis  . BRCA negative 07/2017   MyRisk neg  . Cancer ([place])    uterine  . Cellulitis of both lower extremities    Chronic  . CHF (congestive heart failure) ([place])   . Coronary artery disease   . Diabetes mellitus without complication ([place])   . Family history of breast cancer 07/2017   MyRisk neg; IBIS=10.5%/riskscore=17%  . Family history of ovarian cancer   . Fibromyalgia   . Fibromyalgia affecting shoulder region   . Hyperlipidemia   . Hypertension   . Spinal stenosis      Patient Active Problem List   Diagnosis Date Noted  . Polyneuropathy associated with underlying disease ([place])  02/03/2020  . Moderate episode of recurrent major depressive disorder ([place]) 02/03/2020  . Grief 10/20/2019  . Mixed stress and urge urinary incontinence 08/21/2019  . Avitaminosis D 07/15/2019  . Drug-induced constipation 06/12/2019  . General weakness 07/09/2018  . Lumbar spondylosis 07/08/2018  . Chronic bilateral low back pain with bilateral sciatica 07/08/2018  . Chronic pain syndrome 07/08/2018  . Peripheral neuropathy 03/19/2018  . Urinary incontinence 03/19/2018  . Complex endometrial hyperplasia with atypia 09/03/2017  . Atypical endometrial hyperplasia 08/15/2017  . Lymphedema 01/31/2017  . Hyperlipidemia associated with type 2 diabetes mellitus ([place]) 12/14/2016  . HTN (hypertension) 12/14/2016  . Chronic systolic heart failure ([place]) 10/06/2015  . History of MI (myocardial infarction) 09/20/2015  . Allergic rhinitis 07/06/2015  . Chronic venous insufficiency 07/06/2015  . Atherosclerosis of coronary artery 07/06/2015  . MDD (major depressive disorder) 07/06/2015  . Venous stasis ulcer of ankle with fat layer exposed with varicose veins ([place]) 07/06/2015  . Encounter for long-term (current) use of other medications 07/26/2014  . Diabetes ([place]) 04/08/2014  . Migraines 04/08/2014  . Gastroduodenal ulcer 04/08/2014  . Venous stasis 04/08/2014  . Osteoarthritis 04/08/2014  . Coronary artery disease 04/08/2014  . Morbid obesity ([place]) 04/08/2014  . Peptic ulcer disease 04/08/2014  . Rheumatoid arthritis ([place]) 04/06/2014    Past Surgical History:  Procedure Laterality Date  . ABLATION SAPHENOUS VEIN W/ RFA    . ANAL FISSURECTOMY  01/2004  . CORONARY ANGIOPLASTY    . CYSTOSCOPY  09/03/2017   Procedure: CYSTOSCOPY;  Surgeon: Gae Dry, MD;  Location: [facility] ORS;  Service: Gynecology;;  . DILATION AND CURETTAGE OF UTERUS    . HERNIA REPAIR    . LAPAROSCOPIC HYSTERECTOMY N/A 09/03/2017   Procedure: HYSTERECTOMY TOTAL LAPAROSCOPIC BSO;  Surgeon: Gae Dry, MD;   Location: [facility] ORS;  Service: Gynecology;  Laterality: N/A;  . MEDIAL PARTIAL KNEE REPLACEMENT Left 01/06/2014  . UMBILICAL HERNIA REPAIR N/A 09/05/2015   Procedure: HERNIA REPAIR incarcerated UMBILICAL ADULT;  Surgeon: Sherri Rad, MD;  Location: [facility] ORS;  Service: General;  Laterality: N/A;  . Vascular Stent  11/04/2013    Her family history includes Aneurysm in her father; Breast cancer (age of onset: 29) in her sister; Breast cancer (age of onset: 23) in her sister; COPD in her father and sister; Cataracts in her father and mother; Colon cancer in her paternal uncle and paternal uncle; Dementia in her father and mother; Diabetes in her father; Fibromyalgia in her sister; Heart attack in her brother; Hypertension in her mother and sister; Melanoma in her father; Migraines in her brother and sister; Ovarian cancer (age of onset: 32) in her sister; Thyroid disease in her mother; Uterine cancer in her cousin and cousin.   Current Outpatient Medications:  .  cetirizine (ZYRTEC) 10 MG  tablet, TAKE 1 TABLET (10 MG TOTAL) BY MOUTH DAILY. (NOT COVER*), Disp: 90 tablet, Rfl: 3 .  Dulaglutide 1.5 MG/0.5ML SOPN, Inject 1.5 mg into the skin once a week., Disp: 12 pen, Rfl: 3 .  DULoxetine (CYMBALTA) 60 MG capsule, Take 1 capsule (60 mg total) by mouth daily., Disp: 90 capsule, Rfl: 3 .  folic acid (FOLVITE) 1 MG tablet, Take 1 mg by mouth daily., Disp: , Rfl:  .  methotrexate 50 MG/2ML injection, TAKE 0.8 ML ONCE A WEEK FOR 12 WEEKS, Disp: , Rfl:  .  naproxen (NAPROSYN) 500 MG tablet, Take 1 tablet (500 mg total) by mouth 2 (two) times daily with a meal., Disp: 60 tablet, Rfl: 0 .  pregabalin (LYRICA) 75 MG capsule, Take 1 capsule (75 mg total) by mouth 3 (three) times daily., Disp: 90 capsule, Rfl: 3 .  tolterodine (DETROL LA) 4 MG 24 hr capsule, Take 1 capsule (4 mg total) by mouth daily., Disp: 90 capsule, Rfl: 3 .  Vitamin D, Ergocalciferol, (DRISDOL) 1.25 MG (50000 UNIT) CAPS capsule, TAKE 1 CAPSULE  (50,000 UNITS TOTAL) BY MOUTH EVERY 7 (SEVEN) DAYS., Disp: 12 capsule, Rfl: 0 .  Continuous Blood Gluc Receiver (FREESTYLE LIBRE 14 DAY READER) DEVI, 1 each by Does not apply route every 14 (fourteen) days., Disp: 1 each, Rfl: 1 .  Continuous Blood Gluc Sensor (FREESTYLE LIBRE 14 DAY SENSOR) MISC, 1 each by Does not apply route every 14 (fourteen) days., Disp: 6 each, Rfl: 3  Patient Care Team: Sobia Karger, Dionne Bucy, MD as PCP - General (Family Medicine) Bjorn Loser, MD as Consulting Physician (Urology) Marlowe Sax, MD as Referring Physician (Internal Medicine)     Objective:    Vitals: BP (!) 142/82   Pulse 89   Temp (!) 97.1 F (36.2 C) (Temporal)   Resp 16   Ht '5\' 8"'  (1.727 m)   Wt 297 lb 6.4 oz (134.9 kg)   LMP  (LMP Unknown) Comment: age 33  BMI 45.22 kg/m   Physical Exam Vitals reviewed.  Constitutional:      General: She is not in acute distress.    Appearance: Normal appearance. She is well-developed. She is not diaphoretic.  HENT:     Head: Normocephalic and atraumatic.     Right Ear: External ear normal.     Left Ear: External ear normal.  Eyes:     General: No scleral icterus.    Conjunctiva/sclera: Conjunctivae normal.     Pupils: Pupils are equal, round, and reactive to light.  Neck:     Thyroid: No thyromegaly.  Cardiovascular:     Rate and Rhythm: Normal rate and regular rhythm.     Pulses: Normal pulses.     Heart sounds: Normal heart sounds. No murmur.  Pulmonary:     Effort: Pulmonary effort is normal. No respiratory distress.     Breath sounds: Normal breath sounds. No wheezing or rales.  Abdominal:     General: There is no distension.     Palpations: Abdomen is soft.     Tenderness: There is no abdominal tenderness.  Musculoskeletal:        General: No deformity.     Cervical back: Neck supple.     Right lower leg: No edema.     Left lower leg: No edema.  Lymphadenopathy:     Cervical: No cervical adenopathy.  Skin:     General: Skin is warm and dry.  Neurological:     Mental Status: She is  alert and oriented to person, place, and time. Mental status is at baseline.  Psychiatric:        Mood and Affect: Mood normal.        Behavior: Behavior normal.        Thought Content: Thought content normal.     Activities of Daily Living In your present state of health, do you have any difficulty performing the following activities: 02/02/2020  Hearing? N  Vision? N  Difficulty concentrating or making decisions? N  Walking or climbing stairs? N  Dressing or bathing? N  Doing errands, shopping? N  Preparing Food and eating ? N  Using the Toilet? N  In the past six months, have you accidently leaked urine? Y  Comment Due to overactive bladder. Currently seeing a urologist for this.  Do you have problems with loss of bowel control? N  Managing your Medications? N  Managing your Finances? N  Housekeeping or managing your Housekeeping? N  Some recent data might be hidden    Fall Risk Assessment Fall Risk  02/02/2020 11/05/2018 10/09/2018 07/08/2018 04/10/2018  Falls in the past year? 0 0 0 No No  Number falls in past yr: 0 - - - -  Injury with Fall? 0 - - - -     Depression Screen PHQ 2/9 Scores 02/03/2020 02/02/2020 04/16/2019 11/05/2018  PHQ - 2 Score '1 1 2 ' 0  PHQ- 9 Score 2 - 8 -  Exception Documentation - - - -    6CIT Screen 02/02/2020  What Year? 0 points  What month? 0 points  What time? 0 points  Count back from 20 0 points  Months in reverse 0 points  Repeat phrase 0 points  Total Score 0       Assessment & Plan:    Annual Physical Reviewed patient's Family Medical History Reviewed and updated list of patient's medical providers Assessment of cognitive impairment was done Assessed patient's functional ability Established a written schedule for health screening [place] Completed and Reviewed  Exercise Activities and Dietary recommendations Goals    . Exercise 3x per  week (30 min per time)     Recommend to start walking 3 days a week for at least 30 minutes at a time.        Immunization History  Administered Date(s) Administered  . Influenza,inj,Quad PF,6+ Mos 09/06/2015, 12/13/2016  . Pneumococcal Polysaccharide-23 12/13/2016    Health Maintenance  Topic Date Due  . MAMMOGRAM  09/21/2003  . COLONOSCOPY  09/21/2003  . OPHTHALMOLOGY EXAM  01/04/2017  . DEXA SCAN  09/20/2018  . INFLUENZA VACCINE  07/03/2020 (Originally 07/04/2019)  . TETANUS/TDAP  02/01/2021 (Originally 09/20/1972)  . PNA vac Low Risk Adult (1 of 2 - PCV13) 02/01/2021 (Originally 09/20/2018)  . HEMOGLOBIN A1C  04/17/2020  . FOOT EXAM  02/02/2021  . URINE MICROALBUMIN  02/02/2021  . Hepatitis C Screening  Completed     Discussed health benefits of physical activity, and encouraged her to engage in regular exercise appropriate for her age and condition.    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      Chronic systolic heart failure (HCC)    Stable euvolemic today Upcoming repeat Echo No longer on losartan, lasix, or coreg May need to consider restarting at least 1 of these given elevated blood pressure      HTN (hypertension)    Previously well controlled She is now off of her Lasix, losartan, Coreg She reports good control at home, though blood pressure is elevated today Advised her to continue to monitor and we will follow-up at next visit, but we likely need to consider  resuming losartan or Coreg      Relevant Orders   Comprehensive metabolic panel     Endocrine   Diabetes ([place])    Previously uncontrolled Goal A1c less than 7 Did not tolerate Jardiance due to increased yeast infections Continue Trulicity No longer on insulin Weight loss is likely helping her blood sugar We will try to get her a continuous glucose monitor approved through insurance Foot exam completed today Urine microalbumin collected today On statin Recheck A1c ROI sent for previous eye exam Follow-up in 3 months      Relevant Medications   Dulaglutide 1.5 MG/0.5ML SOPN   Continuous Blood Gluc Receiver (FREESTYLE LIBRE 14 DAY READER) DEVI   Continuous Blood Gluc Sensor (FREESTYLE LIBRE 14 DAY SENSOR) MISC   Other Relevant Orders   Hemoglobin A1c   POCT UA - Microalbumin (Completed)   Hyperlipidemia associated with type 2 diabetes mellitus (HCC)    Tolerating Crestor 3 times weekly Side effects much improved We will continue at current dose Recheck lipid panel and CMP      Relevant Medications   Dulaglutide 1.5 MG/0.5ML SOPN   Other Relevant Orders   Comprehensive metabolic panel   Lipid panel     Nervous and Auditory   Polyneuropathy associated with underlying disease (HCC)    Continue Lyrica at current dose        Musculoskeletal and Integument   Rheumatoid arthritis ([place])    Followed by rheumatology Continue methotrexate Recheck CBC, CMP        Other   Morbid obesity ([place])    Discussed importance of healthy weight management Discussed diet and exercise  Congratulated on weight loss      Relevant Medications   Dulaglutide 1.5 MG/0.5ML SOPN   Avitaminosis D    Continue high-dose supplement Recheck vitamin D level      Relevant Orders   VITAMIN D 25 Hydroxy (Vit-D Deficiency, Fractures)   Moderate episode of recurrent major depressive disorder (HCC)    Chronic and stable Good family support in the setting of grief Continue Cymbalta at  current dose       Other Visit Diagnoses    Encounter for annual physical exam    -  Primary   Long-term use of high-risk medication       Relevant Orders   Hemoglobin A1c   Comprehensive metabolic panel   Lipid panel   VITAMIN D 25 Hydroxy (Vit-D Deficiency, Fractures)   CBC w/Diff/Platelet       Return in about 3 months (around 05/05/2020) for chronic disease f/u.   The entirety of the information documented in the History of Present Illness, Review of Systems and Physical Exam were personally obtained by me. Portions of this information were initially documented by Lynford Humphrey, CMA and reviewed by me for thoroughness and accuracy.    Zakee Deerman, Dionne Bucy, MD MPH [facility]

## 2020-02-03 NOTE — Assessment & Plan Note (Signed)
Followed by rheumatology Continue methotrexate Recheck CBC, CMP

## 2020-02-03 NOTE — Telephone Encounter (Signed)
-----   Message from Virginia Crews, MD sent at 02/03/2020  1:44 PM EST ----- High microalbumin.  Needs to resume losartan 50mg  daily for kidney protection from the diabetes.  Will also help with the fluctuating BP.  OK to eRx if patient agrees

## 2020-02-03 NOTE — Assessment & Plan Note (Signed)
Previously uncontrolled Goal A1c less than 7 Did not tolerate Jardiance due to increased yeast infections Continue Trulicity No longer on insulin Weight loss is likely helping her blood sugar We will try to get her a continuous glucose monitor approved through insurance Foot exam completed today Urine microalbumin collected today On statin Recheck A1c ROI sent for previous eye exam Follow-up in 3 months

## 2020-02-03 NOTE — Patient Instructions (Signed)
Preventive Care 67 Years and Older, Female Preventive care refers to lifestyle choices and visits with your health care provider that can promote health and wellness. This includes:  A yearly physical exam. This is also called an annual well check.  Regular dental and eye exams.  Immunizations.  Screening for certain conditions.  Healthy lifestyle choices, such as diet and exercise. What can I expect for my preventive care visit? Physical exam Your health care provider will check:  Height and weight. These may be used to calculate body mass index (BMI), which is a measurement that tells if you are at a healthy weight.  Heart rate and blood pressure.  Your skin for abnormal spots. Counseling Your health care provider may ask you questions about:  Alcohol, tobacco, and drug use.  Emotional well-being.  Home and relationship well-being.  Sexual activity.  Eating habits.  History of falls.  Memory and ability to understand (cognition).  Work and work Statistician.  Pregnancy and menstrual history. What immunizations do I need?  Influenza (flu) vaccine  This is recommended every year. Tetanus, diphtheria, and pertussis (Tdap) vaccine  You may need a Td booster every 10 years. Varicella (chickenpox) vaccine  You may need this vaccine if you have not already been vaccinated. Zoster (shingles) vaccine  You may need this after age 33. Pneumococcal conjugate (PCV13) vaccine  One dose is recommended after age 33. Pneumococcal polysaccharide (PPSV23) vaccine  One dose is recommended after age 72. Measles, mumps, and rubella (MMR) vaccine  You may need at least one dose of MMR if you were born in 1957 or later. You may also need a second dose. Meningococcal conjugate (MenACWY) vaccine  You may need this if you have certain conditions. Hepatitis A vaccine  You may need this if you have certain conditions or if you travel or work in places where you may be exposed  to hepatitis A. Hepatitis B vaccine  You may need this if you have certain conditions or if you travel or work in places where you may be exposed to hepatitis B. Haemophilus influenzae type b (Hib) vaccine  You may need this if you have certain conditions. You may receive vaccines as individual doses or as more than one vaccine together in one shot (combination vaccines). Talk with your health care provider about the risks and benefits of combination vaccines. What tests do I need? Blood tests  Lipid and cholesterol levels. These may be checked every 5 years, or more frequently depending on your overall health.  Hepatitis C test.  Hepatitis B test. Screening  Lung cancer screening. You may have this screening every year starting at age 39 if you have a 30-pack-year history of smoking and currently smoke or have quit within the past 15 years.  Colorectal cancer screening. All adults should have this screening starting at age 36 and continuing until age 15. Your health care provider may recommend screening at age 23 if you are at increased risk. You will have tests every 1-10 years, depending on your results and the type of screening test.  Diabetes screening. This is done by checking your blood sugar (glucose) after you have not eaten for a while (fasting). You may have this done every 1-3 years.  Mammogram. This may be done every 1-2 years. Talk with your health care provider about how often you should have regular mammograms.  BRCA-related cancer screening. This may be done if you have a family history of breast, ovarian, tubal, or peritoneal cancers.  Other tests  Sexually transmitted disease (STD) testing.  Bone density scan. This is done to screen for osteoporosis. You may have this done starting at age 44. Follow these instructions at home: Eating and drinking  Eat a diet that includes fresh fruits and vegetables, whole grains, lean protein, and low-fat dairy products. Limit  your intake of foods with high amounts of sugar, saturated fats, and salt.  Take vitamin and mineral supplements as recommended by your health care provider.  Do not drink alcohol if your health care provider tells you not to drink.  If you drink alcohol: ? Limit how much you have to 0-1 drink a day. ? Be aware of how much alcohol is in your drink. In the [place] one drink equals one 12 oz bottle of beer (355 mL), one 5 oz glass of wine (148 mL), or one 1 oz glass of hard liquor (44 mL). Lifestyle  Take daily care of your teeth and gums.  Stay active. Exercise for at least 30 minutes on 5 or more days each week.  Do not use any products that contain nicotine or tobacco, such as cigarettes, e-cigarettes, and chewing tobacco. If you need help quitting, ask your health care provider.  If you are sexually active, practice safe sex. Use a condom or other form of protection in order to prevent STIs (sexually transmitted infections).  Talk with your health care provider about taking a low-dose aspirin or statin. What's next?  Go to your health care provider once a year for a well check visit.  Ask your health care provider how often you should have your eyes and teeth checked.  Stay up to date on all vaccines. This information is not intended to replace advice given to you by your health care provider. Make sure you discuss any questions you have with your health care provider. Document Revised: 11/13/2018 Document Reviewed: 11/13/2018 Elsevier Patient Education  2020 [employer].

## 2020-02-03 NOTE — Assessment & Plan Note (Addendum)
Stable euvolemic today Upcoming repeat Echo No longer on losartan, lasix, or coreg May need to consider restarting at least 1 of these given elevated blood pressure

## 2020-02-03 NOTE — Assessment & Plan Note (Signed)
Chronic and stable Good family support in the setting of grief Continue Cymbalta at current dose

## 2020-02-03 NOTE — Assessment & Plan Note (Signed)
Discussed importance of healthy weight management ?Discussed diet and exercise  ?Congratulated on weight loss ?

## 2020-02-03 NOTE — Assessment & Plan Note (Signed)
Recurrent issue No signs of infection She has worked with home health nurse previously and wounds are improving significantly She can continue dressing them and cleaning them at home She has previously followed with vascular surgery as well for her venous stasis.

## 2020-02-03 NOTE — Assessment & Plan Note (Signed)
Continue high-dose supplement Recheck vitamin D level

## 2020-02-03 NOTE — Telephone Encounter (Signed)
Patient advised as below. Patient verbalizes understanding and is in agreement with treatment plan.  

## 2020-02-03 NOTE — Assessment & Plan Note (Signed)
Continue Lyrica at current dose

## 2020-02-03 NOTE — Assessment & Plan Note (Signed)
Tolerating Crestor 3 times weekly Side effects much improved We will continue at current dose Recheck lipid panel and CMP

## 2020-02-04 LAB — COMPREHENSIVE METABOLIC PANEL
ALT: 13 IU/L (ref 0–32)
AST: 18 IU/L (ref 0–40)
Albumin/Globulin Ratio: 1.1 — ABNORMAL LOW (ref 1.2–2.2)
Albumin: 4 g/dL (ref 3.8–4.8)
Alkaline Phosphatase: 100 IU/L (ref 39–117)
BUN/Creatinine Ratio: 21 (ref 12–28)
BUN: 13 mg/dL (ref 8–27)
Bilirubin Total: 0.3 mg/dL (ref 0.0–1.2)
CO2: 22 mmol/L (ref 20–29)
Calcium: 10.2 mg/dL (ref 8.7–10.3)
Chloride: 97 mmol/L (ref 96–106)
Creatinine, Ser: 0.62 mg/dL (ref 0.57–1.00)
GFR calc Af Amer: 109 mL/min/{1.73_m2} (ref >59)
GFR calc non Af Amer: 94 mL/min/{1.73_m2} (ref >59)
Globulin, Total: 3.6 g/dL (ref 1.5–4.5)
Glucose: 197 mg/dL — ABNORMAL HIGH (ref 65–99)
Potassium: 4.6 mmol/L (ref 3.5–5.2)
Sodium: 137 mmol/L (ref 134–144)
Total Protein: 7.6 g/dL (ref 6.0–8.5)

## 2020-02-04 LAB — CBC WITH DIFFERENTIAL/PLATELET
Basophils Absolute: 0 10*3/uL (ref 0.0–0.2)
Basos: 1 %
EOS (ABSOLUTE): 0.2 10*3/uL (ref 0.0–0.4)
Eos: 2 %
Hematocrit: 41.6 % (ref 34.0–46.6)
Hemoglobin: 13.5 g/dL (ref 11.1–15.9)
Immature Grans (Abs): 0 10*3/uL (ref 0.0–0.1)
Immature Granulocytes: 0 %
Lymphocytes Absolute: 1.6 10*3/uL (ref 0.7–3.1)
Lymphs: 23 %
MCH: 26.3 pg — ABNORMAL LOW (ref 26.6–33.0)
MCHC: 32.5 g/dL (ref 31.5–35.7)
MCV: 81 fL (ref 79–97)
Monocytes Absolute: 0.4 10*3/uL (ref 0.1–0.9)
Monocytes: 6 %
Neutrophils Absolute: 4.6 10*3/uL (ref 1.4–7.0)
Neutrophils: 68 %
Platelets: 244 10*3/uL (ref 150–450)
RBC: 5.13 x10E6/uL (ref 3.77–5.28)
RDW: 14.5 % (ref 11.7–15.4)
WBC: 6.8 10*3/uL (ref 3.4–10.8)

## 2020-02-04 LAB — LIPID PANEL
Chol/HDL Ratio: 7.9 ratio — ABNORMAL HIGH (ref 0.0–4.4)
Cholesterol, Total: 347 mg/dL — ABNORMAL HIGH (ref 100–199)
HDL: 44 mg/dL (ref >39)
LDL Chol Calc (NIH): 240 mg/dL — ABNORMAL HIGH (ref 0–99)
Triglycerides: 298 mg/dL — ABNORMAL HIGH (ref 0–149)
VLDL Cholesterol Cal: 63 mg/dL — ABNORMAL HIGH (ref 5–40)

## 2020-02-04 LAB — VITAMIN D 25 HYDROXY (VIT D DEFICIENCY, FRACTURES): Vit D, 25-Hydroxy: 33 ng/mL (ref 30.0–100.0)

## 2020-02-04 LAB — HEMOGLOBIN A1C
Est. average glucose Bld gHb Est-mCnc: 235 mg/dL
Hgb A1c MFr Bld: 9.8 % — ABNORMAL HIGH (ref 4.8–5.6)

## 2020-02-05 ENCOUNTER — Other Ambulatory Visit: Payer: Self-pay

## 2020-02-05 DIAGNOSIS — E785 Hyperlipidemia, unspecified: Secondary | ICD-10-CM

## 2020-02-05 DIAGNOSIS — E1169 Type 2 diabetes mellitus with other specified complication: Secondary | ICD-10-CM

## 2020-02-05 DIAGNOSIS — E1142 Type 2 diabetes mellitus with diabetic polyneuropathy: Secondary | ICD-10-CM

## 2020-02-05 MED ORDER — LOSARTAN POTASSIUM 50 MG PO TABS: 50.0000 mg | ORAL_TABLET | Freq: Every day | ORAL | 3 refills | Status: DC

## 2020-02-05 NOTE — Telephone Encounter (Signed)
-----   Message from Virginia Crews, MD sent at 02/05/2020 12:42 PM EST ----- Normal kidney function, liver function, electrolytes, blood counts.  A1c has improved slightly, but still remains elevated at 9.8.  Cholesterol has increased significantly.  Is she taking her statin regularly?  Definitely needs to be taking her Trulicity.  May need to resume insulin as well.

## 2020-02-05 NOTE — Telephone Encounter (Signed)
Pt advised. She agreed to restart losartan (RX sent to [facility] [address]  Pt states she is not taking a statin, she does not tolerate it.    Thanks,   -Mickel Baas

## 2020-02-08 MED ORDER — EZETIMIBE 10 MG PO TABS: 10.0000 mg | ORAL_TABLET | Freq: Every day | ORAL | 1 refills | Status: DC

## 2020-02-08 MED ORDER — LOSARTAN POTASSIUM 50 MG PO TABS: 50.0000 mg | ORAL_TABLET | Freq: Every day | ORAL | 1 refills | Status: DC

## 2020-02-08 NOTE — Telephone Encounter (Addendum)
Pt agreed to try Zetia.  RX sent to [facility].   Thanks,   -Mickel Baas

## 2020-02-08 NOTE — Telephone Encounter (Signed)
OK to reorder losartan.  Will try Zetia 10mg  daily instead of statin. OK to eRx.

## 2020-02-09 ENCOUNTER — Telehealth: Payer: Self-pay

## 2020-02-09 ENCOUNTER — Other Ambulatory Visit: Payer: Self-pay

## 2020-02-09 ENCOUNTER — Encounter: Payer: Self-pay | Admitting: Family Medicine

## 2020-02-09 DIAGNOSIS — E1142 Type 2 diabetes mellitus with diabetic polyneuropathy: Secondary | ICD-10-CM

## 2020-02-09 DIAGNOSIS — Z1211 Encounter for screening for malignant neoplasm of colon: Secondary | ICD-10-CM

## 2020-02-09 MED ORDER — FREESTYLE LIBRE 14 DAY SENSOR MISC: 1.0000 | 3 refills | Status: DC

## 2020-02-09 NOTE — Telephone Encounter (Signed)
Gastroenterology Pre-Procedure Review  Request Date: 03/28/20 Requesting Physician: Dr. Vicente Males  PATIENT REVIEW QUESTIONS: The patient responded to the following health history questions as indicated:    1. Are you having any GI issues? no 2. Do you have a personal history of Polyps? Yes pt states it was many years ago 3. Do you have a family history of Colon Cancer or Polyps? yes (sister colon polyps) 4. Diabetes Mellitus? yes (takes trulicity) 5. Joint replacements in the past 12 months?no 6. Major health problems in the past 3 months?no 7. Any artificial heart valves, MVP, or defibrillator?no    MEDICATIONS & ALLERGIES:    Patient reports the following regarding taking any anticoagulation/antiplatelet therapy:   Plavix, Coumadin, Eliquis, Xarelto, Lovenox, Pradaxa, Brilinta, or Effient? no Aspirin? yes (81 mg daily)  Patient confirms/reports the following medications:  Current Outpatient Medications  Medication Sig Dispense Refill  . cetirizine (ZYRTEC) 10 MG tablet TAKE 1 TABLET (10 MG TOTAL) BY MOUTH DAILY. (NOT COVER*) 90 tablet 3  . Continuous Blood Gluc Receiver (FREESTYLE LIBRE 14 DAY READER) DEVI 1 each by Does not apply route every 14 (fourteen) days. 1 each 1  . Continuous Blood Gluc Sensor (FREESTYLE LIBRE 14 DAY SENSOR) MISC 1 each by Does not apply route every 14 (fourteen) days. 6 each 3  . Dulaglutide 1.5 MG/0.5ML SOPN Inject 1.5 mg into the skin once a week. 12 pen 3  . DULoxetine (CYMBALTA) 60 MG capsule Take 1 capsule (60 mg total) by mouth daily. 90 capsule 3  . ezetimibe (ZETIA) 10 MG tablet Take 1 tablet (10 mg total) by mouth daily. 90 tablet 1  . folic acid (FOLVITE) 1 MG tablet Take 1 mg by mouth daily.    [name] losartan (COZAAR) 50 MG tablet Take 1 tablet (50 mg total) by mouth daily. 90 tablet 1  . methotrexate 50 MG/2ML injection TAKE 0.8 ML ONCE A WEEK FOR 12 WEEKS    . naproxen (NAPROSYN) 500 MG tablet Take 1 tablet (500 mg total) by mouth 2 (two) times daily  with a meal. 60 tablet 0  . pregabalin (LYRICA) 75 MG capsule Take 1 capsule (75 mg total) by mouth 3 (three) times daily. 90 capsule 3  . tolterodine (DETROL LA) 4 MG 24 hr capsule Take 1 capsule (4 mg total) by mouth daily. 90 capsule 3  . Vitamin D, Ergocalciferol, (DRISDOL) 1.25 MG (50000 UNIT) CAPS capsule TAKE 1 CAPSULE (50,000 UNITS TOTAL) BY MOUTH EVERY 7 (SEVEN) DAYS. 12 capsule 0   No current facility-administered medications for this visit.    Patient confirms/reports the following allergies:  Allergies  Allergen Reactions  . Latex Itching    itching  . Doxycycline Rash  . Sulfa Antibiotics Itching, Rash and Swelling    No orders of the defined types were placed in this encounter.   AUTHORIZATION INFORMATION Primary Insurance: 1D#: Group #:  Secondary Insurance: 1D#: Group #:  SCHEDULE INFORMATION: Date: Monday 03/28/20 Time: Location:[facility]

## 2020-02-15 ENCOUNTER — Encounter: Payer: Self-pay | Admitting: Family Medicine

## 2020-02-15 ENCOUNTER — Ambulatory Visit: Payer: Medicare HMO

## 2020-02-15 DIAGNOSIS — E1142 Type 2 diabetes mellitus with diabetic polyneuropathy: Secondary | ICD-10-CM

## 2020-02-16 MED ORDER — FREESTYLE LIBRE 14 DAY SENSOR MISC: 1.0000 | 3 refills | Status: DC

## 2020-02-17 ENCOUNTER — Encounter: Payer: Self-pay | Admitting: Family Medicine

## 2020-02-22 ENCOUNTER — Ambulatory Visit (INDEPENDENT_AMBULATORY_CARE_PROVIDER_SITE_OTHER): Payer: Medicare Other | Admitting: Urology

## 2020-02-22 ENCOUNTER — Other Ambulatory Visit: Payer: Self-pay

## 2020-02-22 ENCOUNTER — Encounter: Payer: Self-pay | Admitting: Urology

## 2020-02-22 DIAGNOSIS — N3946 Mixed incontinence: Secondary | ICD-10-CM

## 2020-02-22 NOTE — Progress Notes (Signed)
02/22/2020 10:55 AM   [name] 07-15-1951 [id number]  Referring provider: Virginia Crews, [place],  [place] [address]  No chief complaint on file.   HPI: 2019:Patient was consulted to be assessed for worsening urinary incontinence since a hysterectomy in October 2018. She leaks with coughing sneezing bending lifting a significant amount. She wears 10-15 pads a day that are soaked. She has urge incontinence. She can run water and leak without awareness. She will leak when she goes from a sitting to standing position. She has foot on the floor syndrome. She has mild bedwetting but not every night  She voids every 30 to 45 minutes and cannot hold it for 2 hours. She gets up 5-6 times a night. She has left ankle edema.  The pelvic examination was a little bit limited by her obesity. She had grade 2 hypermobility the bladder neck and a negative cough test but her bladder I believe is quite empty. She had a large suprapubic fat pad  Patient has high-volume mixed incontinence with typical triggers. She has intermittent milder bedwetting. She has severe frequency. She has significant nocturia. If the patient ever needed a sling she would need a mini sling and not a retropubic  On urodynamics she did not void but her bladder was empty. Her maximum bladder capacity was 100 mL. She had sensory urgency. The maximum detrusor pressure was 38 cm water occurring at 62 mL. She had to void off the contraction. She had no leakage associatedwith coughing reaching a pressure of 148 cm of water. She was triggering instability. The instability was provoked and unprovoked throughout. During voluntary voiding she voided 76 mils of maximum voiding pressure 21 cm of water. She emptied efficiently. Bladder neck descent at 1 cm.   At least 90% of the patient's problem is not overactive bladder. If she fails medical and behavioral therapy I would be  offering her a refractory overactive bladder therapy. If she truly does have stress incontinence this could persist.  Based upon body habitus Botox and InterStim would not be ideal  Myrbetriq helped minimally.She now only wears about 3 pads a day and only getting up twice at night  She is concerned about percutaneous tibial nerve stimulation because of neuropathy and a skin condition I believe.  Less frequency during the day. Gets up twice instead of four times. Three pads per day incontinence stable.   Today Detrol works better than the oxybutynin gave side effect.  Still using 3 pads a day.  Getting up 2-4 times a day.  Overall much better.  I think we have reached the end of the algorithm.  She thought about percutaneous tibial nerve stimulation but is chosen not to.  Reassess in the year on Detrol  Today Urge incontinence stable.  Frequency stable.  Clinically not infected.  She wanted speak about percutaneous tibial nerve stimulation in detail.  I gave her the handout    PMH: Past Medical History:  Diagnosis Date  . Arthritis    Rhumetoid arthritis  . BRCA negative 07/2017   MyRisk neg  . Cancer ([place])    uterine  . Cellulitis of both lower extremities    Chronic  . CHF (congestive heart failure) ([place])   . Coronary artery disease   . Diabetes mellitus without complication ([place])   . Family history of breast cancer 07/2017   MyRisk neg; IBIS=10.5%/riskscore=17%  . Family history of ovarian cancer   . Fibromyalgia   .  Fibromyalgia affecting shoulder region   . Hyperlipidemia   . Hypertension   . Spinal stenosis     Surgical History: Past Surgical History:  Procedure Laterality Date  . ABLATION SAPHENOUS VEIN W/ RFA    . ANAL FISSURECTOMY  01/2004  . CORONARY ANGIOPLASTY    . CYSTOSCOPY  09/03/2017   Procedure: CYSTOSCOPY;  Surgeon: Gae Dry, MD;  Location: [facility] ORS;  Service: Gynecology;;  . DILATION AND CURETTAGE OF UTERUS    . HERNIA REPAIR    .  LAPAROSCOPIC HYSTERECTOMY N/A 09/03/2017   Procedure: HYSTERECTOMY TOTAL LAPAROSCOPIC BSO;  Surgeon: Gae Dry, MD;  Location: [facility] ORS;  Service: Gynecology;  Laterality: N/A;  . MEDIAL PARTIAL KNEE REPLACEMENT Left 01/06/2014  . UMBILICAL HERNIA REPAIR N/A 09/05/2015   Procedure: HERNIA REPAIR incarcerated UMBILICAL ADULT;  Surgeon: Sherri Rad, MD;  Location: [facility] ORS;  Service: General;  Laterality: N/A;  . Vascular Stent  11/04/2013    Home Medications:  Allergies as of 02/22/2020      Reactions   Latex Itching   itching   Doxycycline Rash   Sulfa Antibiotics Itching, Rash, Swelling      Medication List       Accurate as of February 22, 2020 10:55 AM. If you have any questions, ask your nurse or doctor.        cetirizine 10 MG tablet Commonly known as: ZYRTEC TAKE 1 TABLET (10 MG TOTAL) BY MOUTH DAILY. (NOT COVER*)   Dulaglutide 1.5 MG/0.5ML Sopn Inject 1.5 mg into the skin once a week.   DULoxetine 60 MG capsule Commonly known as: CYMBALTA Take 1 capsule (60 mg total) by mouth daily.   ezetimibe 10 MG tablet Commonly known as: Zetia Take 1 tablet (10 mg total) by mouth daily.   folic acid 1 MG tablet Commonly known as: FOLVITE Take 1 mg by mouth daily.   FreeStyle Libre 14 Day Reader Kerrin Mo 1 each by Does not apply route every 14 (fourteen) days.   FreeStyle Libre 14 Day Sensor Misc 1 each by Does not apply route every 14 (fourteen) days.   losartan 50 MG tablet Commonly known as: COZAAR Take 1 tablet (50 mg total) by mouth daily.   methotrexate 50 MG/2ML injection TAKE 0.8 ML ONCE A WEEK FOR 12 WEEKS   naproxen 500 MG tablet Commonly known as: NAPROSYN Take 1 tablet (500 mg total) by mouth 2 (two) times daily with a meal.   pregabalin 75 MG capsule Commonly known as: Lyrica Take 1 capsule (75 mg total) by mouth 3 (three) times daily.   tolterodine 4 MG 24 hr capsule Commonly known as: Detrol LA Take 1 capsule (4 mg total) by mouth daily.     Vitamin D (Ergocalciferol) 1.25 MG (50000 UNIT) Caps capsule Commonly known as: DRISDOL TAKE 1 CAPSULE (50,000 UNITS TOTAL) BY MOUTH EVERY 7 (SEVEN) DAYS.       Allergies:  Allergies  Allergen Reactions  . Latex Itching    itching  . Doxycycline Rash  . Sulfa Antibiotics Itching, Rash and Swelling    Family History: Family History  Problem Relation Age of Onset  . Hypertension Mother   . Cataracts Mother   . Thyroid disease Mother   . Dementia Mother   . COPD Father   . Dementia Father   . Cataracts Father   . Aneurysm Father        Abdominal & Brain  . Diabetes Father   . Melanoma Father   .  Breast cancer Sister 40  . Fibromyalgia Sister   . Migraines Sister   . Hypertension Sister   . Breast cancer Sister 79  . COPD Sister   . Ovarian cancer Sister 86  . Migraines Brother   . Heart attack Brother   . Colon cancer Paternal Uncle        19s  . Colon cancer Paternal Uncle        5s  . Uterine cancer Cousin   . Uterine cancer Cousin     Social History:  reports that she quit smoking about 23 years ago. She has a 29.00 pack-year smoking history. She has never used smokeless tobacco. She reports that she does not drink alcohol or use drugs.  ROS:                                        Physical Exam: LMP  (LMP Unknown) Comment: age 69  Constitutional:  Alert and oriented, No acute distress. .  Laboratory Data: Lab Results  Component Value Date   WBC 6.8 02/03/2020   HGB 13.5 02/03/2020   HCT 41.6 02/03/2020   MCV 81 02/03/2020   PLT 244 02/03/2020    Lab Results  Component Value Date   CREATININE 0.62 02/03/2020    No results found for: PSA  No results found for: TESTOSTERONE  Lab Results  Component Value Date   HGBA1C 9.8 (H) 02/03/2020    Urinalysis    Component Value Date/Time   COLORURINE YELLOW (A) 09/04/2015 2209   APPEARANCEUR Cloudy (A) 09/28/2019 1530   LABSPEC 1.016 09/04/2015 2209   PHURINE 7.0  09/04/2015 2209   GLUCOSEU Trace (A) 09/28/2019 1530   HGBUR NEGATIVE 09/04/2015 2209   BILIRUBINUR Negative 09/28/2019 1530   KETONESUR 1+ (A) 09/04/2015 2209   PROTEINUR 2+ (A) 09/28/2019 1530   PROTEINUR NEGATIVE 09/04/2015 2209   NITRITE Negative 09/28/2019 1530   NITRITE NEGATIVE 09/04/2015 2209   LEUKOCYTESUR Negative 09/28/2019 1530    Pertinent Imaging:   Assessment & Plan: Start percutaneous tibial nerve stimulation  There are no diagnoses linked to this encounter.  No follow-ups on file.  Reece Packer, MD  [facility] [address] [place], [place] [address] [phone]

## 2020-02-24 ENCOUNTER — Encounter: Payer: Self-pay | Admitting: Family Medicine

## 2020-02-24 ENCOUNTER — Other Ambulatory Visit: Payer: Self-pay

## 2020-02-24 MED ORDER — PREGABALIN 75 MG PO CAPS: 75.0000 mg | ORAL_CAPSULE | Freq: Three times a day (TID) | ORAL | 3 refills | Status: DC

## 2020-02-24 NOTE — Telephone Encounter (Signed)
Request came via mychart.  LOV  02/03/20  LRF  10/19/19  90 x 3

## 2020-02-25 ENCOUNTER — Telehealth: Payer: Self-pay | Admitting: *Deleted

## 2020-02-25 NOTE — Telephone Encounter (Signed)
Patient may be scheduled for PTNS, No Auithorization needed,

## 2020-03-07 ENCOUNTER — Ambulatory Visit: Payer: Medicare Other

## 2020-03-13 ENCOUNTER — Encounter: Payer: Self-pay | Admitting: Family Medicine

## 2020-03-14 ENCOUNTER — Encounter: Payer: Self-pay | Admitting: Family Medicine

## 2020-03-14 MED ORDER — DULOXETINE HCL 60 MG PO CPEP: 60.0000 mg | ORAL_CAPSULE | Freq: Every day | ORAL | 1 refills | Status: DC

## 2020-03-15 ENCOUNTER — Other Ambulatory Visit: Payer: Self-pay | Admitting: Family Medicine

## 2020-03-15 DIAGNOSIS — E559 Vitamin D deficiency, unspecified: Secondary | ICD-10-CM

## 2020-03-15 MED ORDER — OLOPATADINE HCL 0.2 % OP SOLN: 1.0000 [drp] | Freq: Every day | OPHTHALMIC | 3 refills | Status: DC

## 2020-03-15 NOTE — Telephone Encounter (Signed)
Requested medication (s) are due for refill today: yes  Requested medication (s) are on the active medication list: yes  Last refill:  12/28/19  Future visit scheduled: no  Notes to clinic:  12/28/19    Requested Prescriptions  Pending Prescriptions Disp Refills   Vitamin D, Ergocalciferol, (DRISDOL) 1.25 MG (50000 UNIT) CAPS capsule [Pharmacy Med Name: VITAMIN D2 1.25MG (50,000 UNIT)] 12 capsule 0    Sig: TAKE 1 CAPSULE (50,000 UNITS TOTAL) BY MOUTH EVERY 7 (SEVEN) DAYS.      Endocrinology:  Vitamins - Vitamin D Supplementation Failed - 03/15/2020 12:34 PM      Failed - 50,000 IU strengths are not delegated      Failed - Phosphate in normal range and within 360 days    No results found for: PHOS        Passed - Ca in normal range and within 360 days    Calcium  Date Value Ref Range Status  02/03/2020 10.2 8.7 - 10.3 mg/dL Final   Calcium, Total  Date Value Ref Range Status  08/31/2014 8.2 (L) 8.5 - 10.1 mg/dL Final          Passed - Vitamin D in normal range and within 360 days    Vit D, 25-Hydroxy  Date Value Ref Range Status  02/03/2020 33.0 30.0 - 100.0 ng/mL Final    Comment:    Vitamin D deficiency has been defined by the Institute of Medicine and an Endocrine Society practice guideline as a level of serum 25-OH vitamin D less than 20 ng/mL (1,2). The Endocrine Society went on to further define vitamin D insufficiency as a level between 21 and 29 ng/mL (2). 1. IOM (Institute of Medicine). 2010. Dietary reference    intakes for calcium and D. [place]: The    [employer]. 2. Holick MF, Binkley [place], Bischoff-Ferrari HA, et al.    Evaluation, treatment, and prevention of vitamin D    deficiency: an Endocrine Society clinical practice    guideline. JCEM. 2011 Jul; 96(7):1911-30.           Passed - Valid encounter within last 12 months    Recent Outpatient Visits           1 month ago Encounter for annual physical exam   [facility] [place], Dionne Bucy, MD   3 months ago Type 2 diabetes mellitus with diabetic polyneuropathy, without long-term current use of insulin [facility])   [facility], Dionne Bucy, MD   4 months ago Type 2 diabetes mellitus with diabetic polyneuropathy, without long-term current use of insulin [facility])   [facility], Dionne Bucy, MD   8 months ago Essential hypertension   [facility] [place], Dionne Bucy, MD   9 months ago Chronic bilateral low back pain with bilateral sciatica   [facility], Dionne Bucy, MD       Future Appointments             In 1 month Bacigalupo, Dionne Bucy, MD [facility]   In 10 months [place], Nicki Reaper, [place]

## 2020-03-21 ENCOUNTER — Other Ambulatory Visit: Admission: RE | Admit: 2020-03-21 | Payer: Medicare Other | Source: Ambulatory Visit

## 2020-03-21 ENCOUNTER — Other Ambulatory Visit: Payer: Medicare HMO

## 2020-03-23 ENCOUNTER — Ambulatory Visit: Admission: RE | Admit: 2020-03-23 | Payer: Medicare Other | Source: Ambulatory Visit | Admitting: Gastroenterology

## 2020-03-23 ENCOUNTER — Encounter: Admission: RE | Payer: Self-pay | Source: Ambulatory Visit

## 2020-03-23 SURGERY — COLONOSCOPY WITH PROPOFOL
Anesthesia: General

## 2020-04-04 ENCOUNTER — Ambulatory Visit (INDEPENDENT_AMBULATORY_CARE_PROVIDER_SITE_OTHER): Payer: Medicare Other

## 2020-04-04 ENCOUNTER — Other Ambulatory Visit: Payer: Self-pay

## 2020-04-04 DIAGNOSIS — N3946 Mixed incontinence: Secondary | ICD-10-CM | POA: Diagnosis not present

## 2020-04-04 NOTE — Progress Notes (Signed)
PTNS  Session # 1  Health & Social Factors: Pt states she has fibromyalgia and Rheumatoid arthritis, pain can make it difficult for her to ambulate and make it the bathroom. Caffeine: 1 Alcohol: 0 Daytime voids #per day: 10 Night-time voids #per night: 3 Urgency: Severe  Incontinence Episodes #per day: 1-2 Ankle used: Left Treatment Setting: 3 Feeling/ Response: Toe Flex Comments: Patient is diabetic and has neuropathy bilaterally she was unable to feel any stimulation in either lower extremity. Also of note pt has bilateral diabetic ulcers, she is seeing podiatrist for this.  Preformed By: Gordy Clement, CMA   Follow Up: RTC in one week as scheduled. Consent form signed and filed. Patient given weekly voiding diary.

## 2020-04-11 ENCOUNTER — Ambulatory Visit (INDEPENDENT_AMBULATORY_CARE_PROVIDER_SITE_OTHER): Payer: Medicare Other

## 2020-04-11 ENCOUNTER — Other Ambulatory Visit: Payer: Self-pay

## 2020-04-11 DIAGNOSIS — N3946 Mixed incontinence: Secondary | ICD-10-CM | POA: Diagnosis not present

## 2020-04-11 NOTE — Progress Notes (Signed)
PTNS  Session # 2  Health & Social Factors: No Change Caffeine: 1 Alcohol: 0 Daytime voids #per day: 5-6 Night-time voids #per night: 2-3 Urgency: Strong Incontinence Episodes #per day: 1-2 Ankle used: Right Treatment Setting: 3 Feeling/ Response: Toe Flex Comments:  Patient is diabetic and has neuropathy bilaterally. Should also be noted that patient has bilateral diabetic ulcers around the ankles and feet. She is seeing a podiatrist.  Preformed By: Bradly Bienenstock CMA    Follow Up: RTC in 1 week.

## 2020-04-11 NOTE — Patient Instructions (Signed)
Tracking Your Bladder Symptoms    Patient Name:___________________________________________________   Sample: Day   Daytime Voids  Nighttime Voids Urgency for the Day(0-4) Number of Accidents Beverage Comments  Monday IIII II 2 I Water IIII Coffee  I      Week Starting:____________________________________   Day Daytime  Voids Nighttime  Voids Urgency for  The Day(0-4) Number of Accidents Beverages Comments                                                           This week my symptoms were:  O much better  O better O the same O worse   

## 2020-04-18 ENCOUNTER — Ambulatory Visit: Payer: Medicare Other

## 2020-04-18 ENCOUNTER — Encounter: Payer: Self-pay | Admitting: Urology

## 2020-04-25 ENCOUNTER — Ambulatory Visit: Payer: Medicare Other

## 2020-04-25 ENCOUNTER — Encounter: Payer: Self-pay | Admitting: Urology

## 2020-05-03 ENCOUNTER — Ambulatory Visit: Payer: Medicare Other

## 2020-05-04 NOTE — Progress Notes (Deleted)
Established patient visit   Patient: [name] [name]   DOB: 09/19/1951   69 y.o. Female  MRN: [id number] Visit Date: 05/05/2020  Today's healthcare provider: Lavon Paganini, MD   No chief complaint on file.  Subjective    HPI Hypertension, follow-up  BP Readings from Last 3 Encounters:  02/03/20 (!) 142/82  01/25/20 (!) 164/84  12/14/19 (!) 164/84   Wt Readings from Last 3 Encounters:  02/03/20 297 lb 6.4 oz (134.9 kg)  01/25/20 298 lb (135.2 kg)  12/14/19 (!) 304 lb (137.9 kg)     She was last seen for hypertension 3 months ago.  BP at that visit was 142/82. Management since that visit includes off Lasix, Losartan, and Coreg. Advised to continue monitoring BP and advised that we would likely need to consider resuming losartan or Coreg.  She reports {excellent/good/fair/poor:19665} compliance with treatment. She {is/is not:9024} having side effects. {document side effects if present:1} She is following a {diet:21022986} diet. She {is/is not:9024} exercising. She {does/does not:200015} smoke.  Use of agents associated with hypertension: {bp agents assoc with hypertension:511::"none"}.   Outside blood pressures are {***enter patient reported home BP readings, or 'not being checked':1}. Symptoms: {Yes/No:20286} chest pain {Yes/No:20286} chest pressure  {Yes/No:20286} palpitations {Yes/No:20286} syncope  {Yes/No:20286} dyspnea {Yes/No:20286} orthopnea  {Yes/No:20286} paroxysmal nocturnal dyspnea {Yes/No:20286} lower extremity edema   Pertinent labs: Lab Results  Component Value Date   CHOL 347 (H) 02/03/2020   HDL 44 02/03/2020   LDLCALC 240 (H) 02/03/2020   LDLDIRECT 183.0 12/13/2016   TRIG 298 (H) 02/03/2020   CHOLHDL 7.9 (H) 02/03/2020   Lab Results  Component Value Date   NA 137 02/03/2020   K 4.6 02/03/2020   CREATININE 0.62 02/03/2020   GFRNONAA 94 02/03/2020   GFRAA 109 02/03/2020   GLUCOSE 197 (H) 02/03/2020     The ASCVD Risk score (Goff DC  Jr., et al., 2013) failed to calculate for the following reasons:   The patient has a prior MI or stroke diagnosis   ---------------------------------------------------------------------------------------------------   {Show patient history (optional):23778::" "}   Medications: Outpatient Medications Prior to Visit  Medication Sig  . cetirizine (ZYRTEC) 10 MG tablet TAKE 1 TABLET (10 MG TOTAL) BY MOUTH DAILY. (NOT COVER*)  . Continuous Blood Gluc Receiver (FREESTYLE LIBRE 14 DAY READER) DEVI 1 each by Does not apply route every 14 (fourteen) days.  . Continuous Blood Gluc Sensor (FREESTYLE LIBRE 14 DAY SENSOR) MISC 1 each by Does not apply route every 14 (fourteen) days.  . Dulaglutide 1.5 MG/0.5ML SOPN Inject 1.5 mg into the skin once a week.  . DULoxetine (CYMBALTA) 60 MG capsule Take 1 capsule (60 mg total) by mouth daily.  [name] ezetimibe (ZETIA) 10 MG tablet Take 1 tablet (10 mg total) by mouth daily.  . folic acid (FOLVITE) 1 MG tablet Take 1 mg by mouth daily.  [name] losartan (COZAAR) 50 MG tablet Take 1 tablet (50 mg total) by mouth daily.  . methotrexate 50 MG/2ML injection TAKE 0.8 ML ONCE A WEEK FOR 12 WEEKS  . naproxen (NAPROSYN) 500 MG tablet Take 1 tablet (500 mg total) by mouth 2 (two) times daily with a meal.  . Olopatadine HCl (PATADAY) 0.2 % SOLN Apply 1 drop to eye daily.  . pregabalin (LYRICA) 75 MG capsule Take 1 capsule (75 mg total) by mouth 3 (three) times daily.  [name] tolterodine (DETROL LA) 4 MG 24 hr capsule Take 1 capsule (4 mg total) by mouth daily.  [name]  Vitamin D, Ergocalciferol, (DRISDOL) 1.25 MG (50000 UNIT) CAPS capsule TAKE 1 CAPSULE (50,000 UNITS TOTAL) BY MOUTH EVERY 7 (SEVEN) DAYS.   No facility-administered medications prior to visit.    Review of Systems  {Heme  Chem  Endocrine  Serology  Results Review (optional):23779::" "}  Objective    LMP  (LMP Unknown) Comment: age 70 {Show previous vital signs (optional):23777::" "}  Physical Exam  ***  No  results found for any visits on 05/05/20.  Assessment & Plan     ***  No follow-ups on file.      {provider attestation***:1}   Lavon Paganini, MD  [facility] [phone] (phone) [phone] (fax)  [place]

## 2020-05-05 ENCOUNTER — Ambulatory Visit: Payer: Medicare Other | Admitting: Family Medicine

## 2020-05-09 ENCOUNTER — Ambulatory Visit: Payer: Medicare Other

## 2020-05-16 ENCOUNTER — Ambulatory Visit: Payer: Medicare Other

## 2020-05-20 ENCOUNTER — Other Ambulatory Visit: Payer: Self-pay | Admitting: Family Medicine

## 2020-05-20 ENCOUNTER — Other Ambulatory Visit: Payer: Self-pay

## 2020-05-20 ENCOUNTER — Encounter: Payer: Self-pay | Admitting: Family Medicine

## 2020-05-20 MED ORDER — NAPROXEN 500 MG PO TABS: 500.0000 mg | ORAL_TABLET | Freq: Two times a day (BID) | ORAL | 0 refills | Status: DC

## 2020-05-20 MED ORDER — DULOXETINE HCL 60 MG PO CPEP: 60.0000 mg | ORAL_CAPSULE | Freq: Every day | ORAL | 0 refills | Status: DC

## 2020-05-20 MED ORDER — PREGABALIN 75 MG PO CAPS: 75.0000 mg | ORAL_CAPSULE | Freq: Three times a day (TID) | ORAL | 0 refills | Status: DC

## 2020-05-23 ENCOUNTER — Telehealth: Payer: Self-pay | Admitting: Urology

## 2020-05-23 ENCOUNTER — Ambulatory Visit: Payer: Medicare Other

## 2020-05-23 NOTE — Telephone Encounter (Signed)
Pt called to cancel her PTNS apt for now due to needing to heal her legs first

## 2020-05-30 ENCOUNTER — Ambulatory Visit: Payer: Medicare Other

## 2020-05-30 ENCOUNTER — Ambulatory Visit: Payer: Medicare Other | Admitting: Family Medicine

## 2020-06-07 ENCOUNTER — Ambulatory Visit: Payer: Medicare Other

## 2020-06-13 ENCOUNTER — Ambulatory Visit: Payer: Medicare Other

## 2020-06-20 ENCOUNTER — Ambulatory Visit: Payer: Medicare Other

## 2020-06-27 ENCOUNTER — Ambulatory Visit: Payer: Medicare Other

## 2020-07-04 ENCOUNTER — Ambulatory Visit: Payer: Medicare Other

## 2020-07-12 ENCOUNTER — Encounter: Payer: Self-pay | Admitting: Family Medicine

## 2020-07-14 MED ORDER — NAPROXEN 500 MG PO TABS: 500.0000 mg | ORAL_TABLET | Freq: Two times a day (BID) | ORAL | 0 refills | Status: DC

## 2020-08-03 HISTORY — PX: EYE SURGERY: SHX253

## 2020-08-14 ENCOUNTER — Other Ambulatory Visit: Payer: Self-pay | Admitting: Family Medicine

## 2020-08-15 MED ORDER — PREGABALIN 75 MG PO CAPS: 75.0000 mg | ORAL_CAPSULE | Freq: Three times a day (TID) | ORAL | 0 refills | Status: DC

## 2020-08-15 MED ORDER — NAPROXEN 500 MG PO TABS: 500.0000 mg | ORAL_TABLET | Freq: Two times a day (BID) | ORAL | 2 refills | Status: DC

## 2020-09-13 ENCOUNTER — Other Ambulatory Visit: Payer: Self-pay | Admitting: Family Medicine

## 2020-09-13 NOTE — Telephone Encounter (Signed)
Requested Prescriptions  Pending Prescriptions Disp Refills   Olopatadine HCl 0.2 % SOLN [Pharmacy Med Name: OLOPATADINE HCL 0.2% EYE DROP] 7.5 mL 1    Sig: INSTILL 1 DROP INTO AFFECTED EYE EVERY DAY     Ophthalmology:  Antiallergy Passed - 09/13/2020  4:40 PM      Passed - Valid encounter within last 12 months    Recent Outpatient Visits          7 months ago Encounter for annual physical exam   [facility] [place], Dionne Bucy, MD   9 months ago Type 2 diabetes mellitus with diabetic polyneuropathy, without long-term current use of insulin [facility])   [facility], Dionne Bucy, MD   11 months ago Type 2 diabetes mellitus with diabetic polyneuropathy, without long-term current use of insulin [facility]   [facility], Dionne Bucy, MD   1 year ago Essential hypertension   [place], Dionne Bucy, MD   1 year ago Chronic bilateral low back pain with bilateral sciatica   [facility] Bacigalupo, Dionne Bucy, MD      Future Appointments            In 4 months [place], Nicki Reaper, [place]

## 2020-09-30 ENCOUNTER — Other Ambulatory Visit: Payer: Self-pay | Admitting: Family Medicine

## 2020-09-30 ENCOUNTER — Encounter: Payer: Self-pay | Admitting: Family Medicine

## 2020-10-03 MED ORDER — PREGABALIN 75 MG PO CAPS
75.0000 mg | ORAL_CAPSULE | Freq: Three times a day (TID) | ORAL | 1 refills | Status: DC
Start: 2020-10-03 — End: 2020-12-06

## 2020-10-03 MED ORDER — DULOXETINE HCL 60 MG PO CPEP: 60.0000 mg | ORAL_CAPSULE | Freq: Every day | ORAL | 1 refills | Status: DC

## 2020-10-11 ENCOUNTER — Telehealth: Payer: Self-pay | Admitting: *Deleted

## 2020-10-11 NOTE — Chronic Care Management (AMB) (Signed)
  Chronic Care Management   Outreach Note  10/11/2020 Name: [name] [name] MRN: [id number] DOB: [birth year]  [name] [name] is a 69 y.o. year old female who is a primary care patient of Brita Romp, Dionne Bucy, MD. I reached out to [name] by phone today in response to a referral sent by Ms. Adela Glimpse Rippeon's health plan.     A telephone outreach was attempted today spoke to patient she was not able to talk patient took down contact information and states she will call back. The patient was referred to the case management team for assistance with care management and care coordination.   Follow Up Plan: The care management team will reach out to the patient again over the next 7 days.  If patient returns call to provider office, please advise to call [place] at [phone].  [place], [place] [address] Direct Dial: [phone] [email or web address]

## 2020-10-18 NOTE — Chronic Care Management (AMB) (Signed)
  Chronic Care Management   Outreach Note  10/18/2020 Name: [name] [name] MRN: [id number] DOB: 1951-06-06  [name] [name] is a 69 y.o. year old female who is a primary care patient of Brita Romp, Dionne Bucy, MD. I reached out to [name] [name] by phone today in response to a referral sent by [name] [name]'s health plan.     A second unsuccessful telephone outreach was attempted today. The patient was referred to the case management team for assistance with care management and care coordination.   Follow Up Plan: The care management team will reach out to the patient again over the next 7 days.  If patient returns call to provider office, please advise to call [place] at [phone].  [place] Management  Direct Dial: [phone]

## 2020-10-19 ENCOUNTER — Encounter: Payer: Self-pay | Admitting: Family Medicine

## 2020-10-21 ENCOUNTER — Telehealth (INDEPENDENT_AMBULATORY_CARE_PROVIDER_SITE_OTHER): Payer: Medicare Other | Admitting: Family Medicine

## 2020-10-21 DIAGNOSIS — R5081 Fever presenting with conditions classified elsewhere: Secondary | ICD-10-CM

## 2020-10-21 DIAGNOSIS — B372 Candidiasis of skin and nail: Secondary | ICD-10-CM | POA: Diagnosis not present

## 2020-10-21 DIAGNOSIS — L03311 Cellulitis of abdominal wall: Secondary | ICD-10-CM

## 2020-10-21 MED ORDER — CLOTRIMAZOLE-BETAMETHASONE 1-0.05 % EX CREA: 1.0000 "application " | TOPICAL_CREAM | Freq: Two times a day (BID) | CUTANEOUS | 5 refills | Status: DC

## 2020-10-21 MED ORDER — CEPHALEXIN 500 MG PO CAPS: 500.0000 mg | ORAL_CAPSULE | Freq: Four times a day (QID) | ORAL | 0 refills | Status: AC

## 2020-10-21 NOTE — Progress Notes (Signed)
MyChart Video Visit    Virtual Visit via Video Note   This visit type was conducted due to national recommendations for restrictions regarding the COVID-19 Pandemic (e.g. social distancing) in an effort to limit this patient's exposure and mitigate transmission in our community. This patient is at least at moderate risk for complications without adequate follow up. This format is felt to be most appropriate for this patient at this time. Physical exam was limited by quality of the video and audio technology used for the visit.    Patient location: home Provider location: [place] involved in the visit: patient, provider  I discussed the limitations of evaluation and management by telemedicine and the availability of in person appointments. The patient expressed understanding and agreed to proceed.  Patient: [name] [name]   DOB: [birth year]   69 y.o. Female  MRN: [id number] Visit Date: 10/21/2020  Today's healthcare provider: Lavon Paganini, MD   Chief Complaint  Patient presents with  . Rash   Subjective    Rash This is a new problem. The current episode started in the past 7 days. The problem has been gradually improving since onset. The affected locations include the abdomen, torso, left arm, right axilla, right arm and left axilla. The rash is characterized by pain and redness. Associated symptoms include anorexia, fatigue, a fever and shortness of breath. Pertinent negatives include no diarrhea, facial edema, joint pain or vomiting. Past treatments include anti-itch cream and analgesics. The treatment provided no relief.    Fever of Tmax 101 x2 days. No respiratory symptoms  Longstanding candidal infection, now with superimposed crusting and erythema that is worsening and associated with fever  Social History   Tobacco Use  . Smoking status: Former Smoker    Packs/day: 1.00    Years: 29.00    Pack years: 29.00    Quit date: 12/03/1996     Years since quitting: 23.8  . Smokeless tobacco: Never Used  Vaping Use  . Vaping Use: Never used  Substance Use Topics  . Alcohol use: No  . Drug use: No      Medications: Outpatient Medications Prior to Visit  Medication Sig  . cetirizine (ZYRTEC) 10 MG tablet TAKE 1 TABLET (10 MG TOTAL) BY MOUTH DAILY. (NOT COVER*) (Patient taking differently: as needed. TAKE 1 TABLET (10 MG TOTAL) BY MOUTH DAILY. (NOT COVER*))  . Dulaglutide 1.5 MG/0.5ML SOPN Inject 1.5 mg into the skin once a week.  . DULoxetine (CYMBALTA) 60 MG capsule Take 1 capsule (60 mg total) by mouth daily.  . naproxen (NAPROSYN) 500 MG tablet Take 1 tablet (500 mg total) by mouth 2 (two) times daily with a meal.  . Olopatadine HCl 0.2 % SOLN INSTILL 1 DROP INTO AFFECTED EYE EVERY DAY  . pregabalin (LYRICA) 75 MG capsule Take 1 capsule (75 mg total) by mouth 3 (three) times daily.  [name] tolterodine (DETROL LA) 4 MG 24 hr capsule Take 1 capsule (4 mg total) by mouth daily.  . [DISCONTINUED] Continuous Blood Gluc Receiver (FREESTYLE LIBRE 14 DAY READER) DEVI 1 each by Does not apply route every 14 (fourteen) days.  . [DISCONTINUED] Continuous Blood Gluc Sensor (FREESTYLE LIBRE 14 DAY SENSOR) MISC 1 each by Does not apply route every 14 (fourteen) days.  . [DISCONTINUED] ezetimibe (ZETIA) 10 MG tablet Take 1 tablet (10 mg total) by mouth daily. (Patient not taking: Reported on 10/21/2020)  . [DISCONTINUED] folic acid (FOLVITE) 1 MG tablet Take 1 mg by mouth  daily. (Patient not taking: Reported on 10/21/2020)  . [DISCONTINUED] losartan (COZAAR) 50 MG tablet Take 1 tablet (50 mg total) by mouth daily. (Patient not taking: Reported on 10/21/2020)  . [DISCONTINUED] methotrexate 50 MG/2ML injection TAKE 0.8 ML ONCE A WEEK FOR 12 WEEKS (Patient not taking: Reported on 10/21/2020)  . [DISCONTINUED] Vitamin D, Ergocalciferol, (DRISDOL) 1.25 MG (50000 UNIT) CAPS capsule TAKE 1 CAPSULE (50,000 UNITS TOTAL) BY MOUTH EVERY 7 (SEVEN) DAYS.   No  facility-administered medications prior to visit.    Review of Systems  Constitutional: Positive for activity change, chills, fatigue and fever. Negative for diaphoresis.  Respiratory: Positive for shortness of breath.   Cardiovascular: Negative for chest pain and leg swelling.  Gastrointestinal: Positive for anorexia. Negative for diarrhea, nausea and vomiting.  Musculoskeletal: Negative for joint pain.  Skin: Positive for color change and rash.       Objective    LMP  (LMP Unknown) Comment: age 34 BP Readings from Last 3 Encounters:  02/03/20 (!) 142/82  01/25/20 (!) 164/84  12/14/19 (!) 164/84   Wt Readings from Last 3 Encounters:  02/03/20 297 lb 6.4 oz (134.9 kg)  01/25/20 298 lb (135.2 kg)  12/14/19 (!) 304 lb (137.9 kg)      Physical Exam     Assessment & Plan     1. Candidal intertrigo - primary rash that is worsening, now with superimposed bacterial infection - treat with clotrimazole-steroid cream BID - keep area clean, dry, intact  2. Cellulitis of abdominal wall - new problem - superimposed bacterial infection on candidal intertrigo as above - treat with Keflex x5 d - discussed return precautions  3. Fever in other diseases - likely 2/2 cellulitis as above - if persists or develops respiratory symptoms consider COVID testing or eval in person   Meds ordered this encounter  Medications  . clotrimazole-betamethasone (LOTRISONE) cream    Sig: Apply 1 application topically 2 (two) times daily.    Dispense:  90 g    Refill:  5  . cephALEXin (KEFLEX) 500 MG capsule    Sig: Take 1 capsule (500 mg total) by mouth 4 (four) times daily for 5 days.    Dispense:  20 capsule    Refill:  0     Return if symptoms worsen or fail to improve.     I discussed the assessment and treatment plan with the patient. The patient was provided an opportunity to ask questions and all were answered. The patient agreed with the plan and demonstrated an understanding of  the instructions.   The patient was advised to call back or seek an in-person evaluation if the symptoms worsen or if the condition fails to improve as anticipated.  I, Lavon Paganini, MD, have reviewed all documentation for this visit. The documentation on 10/21/20 for the exam, diagnosis, procedures, and orders are all accurate and complete.   Esma Kilts, Dionne Bucy, MD, MPH [facility]

## 2020-10-21 NOTE — Patient Instructions (Signed)

## 2020-10-26 NOTE — Chronic Care Management (AMB) (Signed)
  Chronic Care Management   Outreach Note  10/26/2020 Name: [name] [name] MRN: [id number] DOB: [birth year]  [name] [name] is a 69 y.o. year old female who is a primary care patient of Brita Romp, Dionne Bucy, MD. I reached out to [name] [name] by phone today in response to a referral sent by [name] [name]'s health plan.     Third unsuccessful telephone outreach was attempted today. The patient was referred to the case management team for assistance with care management and care coordination. The patient's primary care provider has been notified of our unsuccessful attempts to make or maintain contact with the patient. The care management team is pleased to engage with this patient at any time in the future should he/she be interested in assistance from the care management team.   Follow Up Plan: If patient returns call to provider office, please advise to call [place] at [phone].  [facility]

## 2020-11-02 DIAGNOSIS — U071 COVID-19: Secondary | ICD-10-CM

## 2020-11-02 HISTORY — DX: COVID-19: U07.1

## 2020-11-05 ENCOUNTER — Emergency Department
Admission: EM | Admit: 2020-11-05 | Discharge: 2020-11-05 | Disposition: A | Discharge status: Home / Self Care | Payer: Medicare Other | Attending: Emergency Medicine | Admitting: Emergency Medicine

## 2020-11-05 ENCOUNTER — Other Ambulatory Visit: Payer: Self-pay

## 2020-11-05 ENCOUNTER — Emergency Department: Payer: Medicare Other

## 2020-11-05 DIAGNOSIS — I110 Hypertensive heart disease with heart failure: Secondary | ICD-10-CM | POA: Diagnosis not present

## 2020-11-05 DIAGNOSIS — Z9861 Coronary angioplasty status: Secondary | ICD-10-CM | POA: Insufficient documentation

## 2020-11-05 DIAGNOSIS — Z8542 Personal history of malignant neoplasm of other parts of uterus: Secondary | ICD-10-CM | POA: Diagnosis not present

## 2020-11-05 DIAGNOSIS — Z96652 Presence of left artificial knee joint: Secondary | ICD-10-CM | POA: Diagnosis not present

## 2020-11-05 DIAGNOSIS — Z87891 Personal history of nicotine dependence: Secondary | ICD-10-CM | POA: Diagnosis not present

## 2020-11-05 DIAGNOSIS — I5022 Chronic systolic (congestive) heart failure: Secondary | ICD-10-CM | POA: Diagnosis not present

## 2020-11-05 DIAGNOSIS — E1142 Type 2 diabetes mellitus with diabetic polyneuropathy: Secondary | ICD-10-CM | POA: Insufficient documentation

## 2020-11-05 DIAGNOSIS — I2510 Atherosclerotic heart disease of native coronary artery without angina pectoris: Secondary | ICD-10-CM | POA: Insufficient documentation

## 2020-11-05 DIAGNOSIS — U071 COVID-19: Secondary | ICD-10-CM | POA: Diagnosis not present

## 2020-11-05 DIAGNOSIS — Z91040 Latex allergy status: Secondary | ICD-10-CM | POA: Insufficient documentation

## 2020-11-05 DIAGNOSIS — R0602 Shortness of breath: Secondary | ICD-10-CM | POA: Diagnosis not present

## 2020-11-05 LAB — CBC
HCT: 31.7 % — ABNORMAL LOW (ref 36.0–46.0)
Hemoglobin: 9.9 g/dL — ABNORMAL LOW (ref 12.0–15.0)
MCH: 25.1 pg — ABNORMAL LOW (ref 26.0–34.0)
MCHC: 31.2 g/dL (ref 30.0–36.0)
MCV: 80.5 fL (ref 80.0–100.0)
Platelets: 334 10*3/uL (ref 150–400)
RBC: 3.94 MIL/uL (ref 3.87–5.11)
RDW: 14.4 % (ref 11.5–15.5)
WBC: 7.2 10*3/uL (ref 4.0–10.5)
nRBC: 0 % (ref 0.0–0.2)

## 2020-11-05 LAB — BASIC METABOLIC PANEL
Anion gap: 16 — ABNORMAL HIGH (ref 5–15)
BUN: 21 mg/dL (ref 8–23)
CO2: 22 mmol/L (ref 22–32)
Calcium: 8.8 mg/dL — ABNORMAL LOW (ref 8.9–10.3)
Chloride: 97 mmol/L — ABNORMAL LOW (ref 98–111)
Creatinine, Ser: 1.1 mg/dL — ABNORMAL HIGH (ref 0.44–1.00)
GFR, Estimated: 55 mL/min — ABNORMAL LOW (ref >60)
Glucose, Bld: 195 mg/dL — ABNORMAL HIGH (ref 70–99)
Potassium: 3.4 mmol/L — ABNORMAL LOW (ref 3.5–5.1)
Sodium: 135 mmol/L (ref 135–145)

## 2020-11-05 LAB — RESP PANEL BY RT-PCR (FLU A&B, COVID) ARPGX2
Influenza A by PCR: NEGATIVE
Influenza B by PCR: NEGATIVE
SARS Coronavirus 2 by RT PCR: POSITIVE — AB

## 2020-11-05 LAB — TROPONIN I (HIGH SENSITIVITY)
Troponin I (High Sensitivity): 14 ng/L (ref <18)
Troponin I (High Sensitivity): 14 ng/L (ref <18)

## 2020-11-05 LAB — BRAIN NATRIURETIC PEPTIDE: B Natriuretic Peptide: 53 pg/mL (ref 0.0–100.0)

## 2020-11-05 MED ORDER — PREDNISONE 10 MG PO TABS: 10.0000 mg | ORAL_TABLET | Freq: Every day | ORAL | 0 refills | Status: DC

## 2020-11-05 MED ORDER — SODIUM CHLORIDE 0.9 % IV BOLUS
1000.0000 mL | Freq: Once | INTRAVENOUS | Status: AC
  Administered 2020-11-05: 1000 mL via INTRAVENOUS

## 2020-11-05 MED ORDER — KETOROLAC TROMETHAMINE 30 MG/ML IJ SOLN
15.0000 mg | Freq: Once | INTRAMUSCULAR | Status: AC
  Administered 2020-11-05: 15 mg via INTRAVENOUS
  Filled 2020-11-05: qty 1

## 2020-11-05 MED ORDER — METHYLPREDNISOLONE SODIUM SUCC 125 MG IJ SOLR
125.0000 mg | Freq: Once | INTRAMUSCULAR | Status: AC
  Administered 2020-11-05: 125 mg via INTRAVENOUS
  Filled 2020-11-05: qty 2

## 2020-11-05 MED ORDER — ONDANSETRON HCL 4 MG/2ML IJ SOLN
4.0000 mg | Freq: Once | INTRAMUSCULAR | Status: AC
  Administered 2020-11-05: 4 mg via INTRAVENOUS
  Filled 2020-11-05: qty 2

## 2020-11-05 MED ORDER — ONDANSETRON HCL 4 MG PO TABS: 4.0000 mg | ORAL_TABLET | Freq: Every day | ORAL | 0 refills | Status: DC | PRN

## 2020-11-05 NOTE — ED Notes (Signed)
MD at bedside. 

## 2020-11-05 NOTE — ED Provider Notes (Signed)
[facility] Emergency Department Provider Note  Time seen: 1:20 PM  I have reviewed the triage vital signs and the nursing notes.   HISTORY  Chief Complaint Dizziness and Shortness of Breath   HPI CARLISLE TORGESON is a 69 y.o. female with a past medical history of arthritis, CHF, diabetes, fibromyalgia, hypertension, hyperlipidemia, presents to the emergency department for shortness of breath and weakness.  According to the patient her fianc passed away this morning due to COVID-19 infection in the emergency department.  While the patient was visiting she became acutely short of breath, states she has been feeling very weak and now exacerbated after this morning's events.  Currently the patient appears well satting 100% on room air with a normal respiratory rate.  Patient denies any vomiting but does state nausea with decreased oral intake.  States she has been experiencing symptoms for approximately 2 weeks.  Has not had vaccinations or prior Covid infection.   Past Medical History:  Diagnosis Date  . Arthritis    Rhumetoid arthritis  . BRCA negative 07/2017   MyRisk neg  . Cancer ([place])    uterine  . Cellulitis of both lower extremities    Chronic  . CHF (congestive heart failure) ([place])   . Coronary artery disease   . Diabetes mellitus without complication ([place])   . Family history of breast cancer 07/2017   MyRisk neg; IBIS=10.5%/riskscore=17%  . Family history of ovarian cancer   . Fibromyalgia   . Fibromyalgia affecting shoulder region   . Hyperlipidemia   . Hypertension   . Spinal stenosis     Patient Active Problem List   Diagnosis Date Noted  . Polyneuropathy associated with underlying disease ([place]) 02/03/2020  . Moderate episode of recurrent major depressive disorder ([place]) 02/03/2020  . Grief 10/20/2019  . Mixed stress and urge urinary incontinence 08/21/2019  . Avitaminosis D 07/15/2019  . Drug-induced constipation 06/12/2019  . General  weakness 07/09/2018  . Lumbar spondylosis 07/08/2018  . Chronic bilateral low back pain with bilateral sciatica 07/08/2018  . Chronic pain syndrome 07/08/2018  . Peripheral neuropathy 03/19/2018  . Urinary incontinence 03/19/2018  . Complex endometrial hyperplasia with atypia 09/03/2017  . Atypical endometrial hyperplasia 08/15/2017  . Lymphedema 01/31/2017  . Hyperlipidemia associated with type 2 diabetes mellitus ([place]) 12/14/2016  . HTN (hypertension) 12/14/2016  . Chronic systolic heart failure ([place]) 10/06/2015  . History of MI (myocardial infarction) 09/20/2015  . Allergic rhinitis 07/06/2015  . Chronic venous insufficiency 07/06/2015  . Atherosclerosis of coronary artery 07/06/2015  . MDD (major depressive disorder) 07/06/2015  . Venous stasis ulcer of ankle with fat layer exposed with varicose veins ([place]) 07/06/2015  . Encounter for long-term (current) use of other medications 07/26/2014  . Diabetes ([place]) 04/08/2014  . Migraines 04/08/2014  . Gastroduodenal ulcer 04/08/2014  . Venous stasis 04/08/2014  . Osteoarthritis 04/08/2014  . Coronary artery disease 04/08/2014  . Morbid obesity ([place]) 04/08/2014  . Peptic ulcer disease 04/08/2014  . Rheumatoid arthritis ([place]) 04/06/2014    Past Surgical History:  Procedure Laterality Date  . ABLATION SAPHENOUS VEIN W/ RFA    . ANAL FISSURECTOMY  01/2004  . CORONARY ANGIOPLASTY    . CYSTOSCOPY  09/03/2017   Procedure: CYSTOSCOPY;  Surgeon: Gae Dry, MD;  Location: [facility] ORS;  Service: Gynecology;;  . DILATION AND CURETTAGE OF UTERUS    . HERNIA REPAIR    . LAPAROSCOPIC HYSTERECTOMY N/A 09/03/2017   Procedure: HYSTERECTOMY TOTAL LAPAROSCOPIC BSO;  Surgeon:  Gae Dry, MD;  Location: [facility] ORS;  Service: Gynecology;  Laterality: N/A;  . MEDIAL PARTIAL KNEE REPLACEMENT Left 01/06/2014  . UMBILICAL HERNIA REPAIR N/A 09/05/2015   Procedure: HERNIA REPAIR incarcerated UMBILICAL ADULT;  Surgeon: Sherri Rad, MD;  Location: [facility]  ORS;  Service: General;  Laterality: N/A;  . Vascular Stent  11/04/2013    Prior to Admission medications   Medication Sig Start Date End Date Taking? Authorizing Provider  cetirizine (ZYRTEC) 10 MG tablet TAKE 1 TABLET (10 MG TOTAL) BY MOUTH DAILY. (NOT COVER*) Patient taking differently: as needed. TAKE 1 TABLET (10 MG TOTAL) BY MOUTH DAILY. (NOT COVER*) 03/30/19   Bacigalupo, Dionne Bucy, MD  clotrimazole-betamethasone (LOTRISONE) cream Apply 1 application topically 2 (two) times daily. 10/21/20   Virginia Crews, MD  Dulaglutide 1.5 MG/0.5ML SOPN Inject 1.5 mg into the skin once a week. 02/03/20   Virginia Crews, MD  DULoxetine (CYMBALTA) 60 MG capsule Take 1 capsule (60 mg total) by mouth daily. 10/03/20   Virginia Crews, MD  naproxen (NAPROSYN) 500 MG tablet Take 1 tablet (500 mg total) by mouth 2 (two) times daily with a meal. 08/15/20   Bacigalupo, Dionne Bucy, MD  Olopatadine HCl 0.2 % SOLN INSTILL 1 DROP INTO AFFECTED EYE EVERY DAY 09/13/20   Bacigalupo, Dionne Bucy, MD  pregabalin (LYRICA) 75 MG capsule Take 1 capsule (75 mg total) by mouth 3 (three) times daily. 10/03/20   Virginia Crews, MD  tolterodine (DETROL LA) 4 MG 24 hr capsule Take 1 capsule (4 mg total) by mouth daily. 01/25/20   Bjorn Loser, MD    Allergies  Allergen Reactions  . Latex Itching    itching  . Doxycycline Rash  . Sulfa Antibiotics Itching, Rash and Swelling    Family History  Problem Relation Age of Onset  . Hypertension Mother   . Cataracts Mother   . Thyroid disease Mother   . Dementia Mother   . COPD Father   . Dementia Father   . Cataracts Father   . Aneurysm Father        Abdominal & Brain  . Diabetes Father   . Melanoma Father   . Breast cancer Sister 42  . Fibromyalgia Sister   . Migraines Sister   . Hypertension Sister   . Breast cancer Sister 74  . COPD Sister   . Ovarian cancer Sister 41  . Migraines Brother   . Heart attack Brother   . Colon cancer Paternal  Uncle        58s  . Colon cancer Paternal Uncle        26s  . Uterine cancer Cousin   . Uterine cancer Cousin     Social History Social History   Tobacco Use  . Smoking status: Former Smoker    Packs/day: 1.00    Years: 29.00    Pack years: 29.00    Quit date: 12/03/1996    Years since quitting: 23.9  . Smokeless tobacco: Never Used  Vaping Use  . Vaping Use: Never used  Substance Use Topics  . Alcohol use: No  . Drug use: No    Review of Systems Constitutional: Fever 2 weeks ago none recently.  Positive for generalized weakness/fatigue ENT: Negative for recent illness/congestion Cardiovascular: Negative for chest pain. Respiratory: Mild shortness of breath especially with exertion. Gastrointestinal: Negative for abdominal pain.  Positive for nausea but negative for vomiting. Musculoskeletal: Negative for musculoskeletal complaints Neurological: Negative for headache All other  ROS negative  ____________________________________________   PHYSICAL EXAM:  VITAL SIGNS: ED Triage Vitals  Enc Vitals Group     BP 11/05/20 0327 (!) 124/45     Pulse Rate 11/05/20 0327 90     Resp 11/05/20 0327 20     Temp 11/05/20 0329 99.4 F (37.4 C)     Temp Source 11/05/20 0327 Oral     SpO2 11/05/20 0327 94 %     Weight 11/05/20 0327 230 lb (104.3 kg)     Height 11/05/20 0327 '5\' 8"'  (1.727 m)     Head Circumference --      Peak Flow --      Pain Score 11/05/20 0327 0     Pain Loc --      Pain Edu? --      Excl. in [place]? --    Constitutional: Alert and oriented. Well appearing and in no distress. Eyes: Normal exam ENT      Head: Normocephalic and atraumatic.      Mouth/Throat: Mucous membranes are moist. Cardiovascular: Normal rate, regular rhythm. Respiratory: Normal respiratory effort without tachypnea nor retractions. Breath sounds are clear  Gastrointestinal: Soft and nontender. No distention.  Musculoskeletal: Nontender with normal range of motion in all  extremities. Neurologic:  Normal speech and language. No gross focal neurologic deficits Skin:  Skin is warm, dry and intact.  Psychiatric: Mood and affect are normal.  ____________________________________________    EKG  EKG viewed and interpreted by myself shows a normal sinus rhythm 88 bpm with a widened QRS, left axis deviation, largely normal intervals with nonspecific ST changes.  ____________________________________________    RADIOLOGY  Chest x-ray is negative  ____________________________________________   INITIAL IMPRESSION / ASSESSMENT AND PLAN / ED COURSE  Pertinent labs & imaging results that were available during my care of the patient were reviewed by me and considered in my medical decision making (see chart for details).   Patient presents to the emergency department for generalized fatigue weakness 2 weeks of shortness of breath cough after her fianc passed away this morning due to Covid infection and difficulty breathing.  Patient is Covid positive, lab work consistent with mild dehydration.  Reassuringly chest x-ray is normal with reassuring vitals including 100% room air saturation.  We will continue to closely monitor the patient in the emergency department, IV hydrate, treat with Toradol Zofran and steroids.  Given the patient's clear chest x-ray reassuring vitals I believe the patient will likely be safe for discharge home with supportive care.  Patient agreeable to plan.  Patient has difficult IV placement.  I placed a peripheral ultrasound-guided IV.  We will dose fluids medicines and reassess.  YOLANDO GILLUM was evaluated in Emergency Department on 11/05/2020 for the symptoms described in the history of present illness. She was evaluated in the context of the global COVID-19 pandemic, which necessitated consideration that the patient might be at risk for infection with the SARS-CoV-2 virus that causes COVID-19. Institutional protocols and algorithms that  pertain to the evaluation of patients at risk for COVID-19 are in a state of rapid change based on information released by regulatory bodies including the CDC and federal and state organizations. These policies and algorithms were followed during the patient's care in the ED.  ____________________________________________   FINAL CLINICAL IMPRESSION(S) / ED DIAGNOSES  COVID-19   Harvest Dark, MD 11/05/20 1415

## 2020-11-05 NOTE — ED Triage Notes (Signed)
Pt was visiting with fiance in ed who recently was deceased when she became dizzy and shob. Pt states she has had a fever, cough and has not felt well for a week.

## 2020-11-05 NOTE — ED Notes (Signed)
Positive covid called from lab.

## 2020-11-05 NOTE — ED Notes (Signed)
Per julia, rn she obtained a repeat troponin and covid swab. Pt is in room 16 with deceased SO.

## 2020-11-06 ENCOUNTER — Telehealth (HOSPITAL_COMMUNITY): Payer: Self-pay | Admitting: Nurse Practitioner

## 2020-11-06 DIAGNOSIS — U071 COVID-19: Secondary | ICD-10-CM

## 2020-11-06 NOTE — Telephone Encounter (Signed)
Called to discuss with [name] [name] about Covid symptoms and the use of a monoclonal antibody infusion for those with mild to moderate Covid symptoms and at a high risk of hospitalization.     Pt does not qualify for infusion therapy given symptoms first presented > 10 days prior to timing of infusion. Symptoms tier reviewed as well as criteria for ending isolation. Preventative practices reviewed. Patient verbalized understanding    Patient Active Problem List   Diagnosis Date Noted  . Polyneuropathy associated with underlying disease ([place]) 02/03/2020  . Moderate episode of recurrent major depressive disorder ([place]) 02/03/2020  . Grief 10/20/2019  . Mixed stress and urge urinary incontinence 08/21/2019  . Avitaminosis D 07/15/2019  . Drug-induced constipation 06/12/2019  . General weakness 07/09/2018  . Lumbar spondylosis 07/08/2018  . Chronic bilateral low back pain with bilateral sciatica 07/08/2018  . Chronic pain syndrome 07/08/2018  . Peripheral neuropathy 03/19/2018  . Urinary incontinence 03/19/2018  . Complex endometrial hyperplasia with atypia 09/03/2017  . Atypical endometrial hyperplasia 08/15/2017  . Lymphedema 01/31/2017  . Hyperlipidemia associated with type 2 diabetes mellitus ([place]) 12/14/2016  . HTN (hypertension) 12/14/2016  . Chronic systolic heart failure ([place]) 10/06/2015  . History of MI (myocardial infarction) 09/20/2015  . Allergic rhinitis 07/06/2015  . Chronic venous insufficiency 07/06/2015  . Atherosclerosis of coronary artery 07/06/2015  . MDD (major depressive disorder) 07/06/2015  . Venous stasis ulcer of ankle with fat layer exposed with varicose veins ([place]) 07/06/2015  . Encounter for long-term (current) use of other medications 07/26/2014  . Diabetes ([place]) 04/08/2014  . Migraines 04/08/2014  . Gastroduodenal ulcer 04/08/2014  . Venous stasis 04/08/2014  . Osteoarthritis 04/08/2014  . Coronary artery disease 04/08/2014  . Morbid obesity ([place])  04/08/2014  . Peptic ulcer disease 04/08/2014  . Rheumatoid arthritis ([place]) 04/06/2014     [name] Rutter, NP [phone] [name]

## 2020-11-08 DIAGNOSIS — Z03818 Encounter for observation for suspected exposure to other biological agents ruled out: Secondary | ICD-10-CM | POA: Diagnosis not present

## 2020-11-09 ENCOUNTER — Telehealth: Payer: Self-pay

## 2020-11-09 NOTE — Telephone Encounter (Signed)
Copied from [place] [id number]. Topic: General - Other >> Nov 09, 2020  2:29 PM Gillis Ends D wrote: Reason for CRM: Patient called and stated that she lost her husband on Saturday and not taking it well and would like for the doctor to call her in some medication. She can be reached at [phone]. Please advise

## 2020-11-10 NOTE — Telephone Encounter (Signed)
Does she want to chat on a virtual visit today or tomorrow?

## 2020-11-10 NOTE — Telephone Encounter (Signed)
Please advise 

## 2020-11-10 NOTE — Telephone Encounter (Signed)
Tried calling pt and no answer. Unable to leave VM due to box being full. Ok for [facility] to ask if pt calls back.

## 2020-11-11 ENCOUNTER — Telehealth (INDEPENDENT_AMBULATORY_CARE_PROVIDER_SITE_OTHER): Payer: Medicare Other | Admitting: Family Medicine

## 2020-11-11 ENCOUNTER — Encounter: Payer: Self-pay | Admitting: Family Medicine

## 2020-11-11 DIAGNOSIS — F4321 Adjustment disorder with depressed mood: Secondary | ICD-10-CM

## 2020-11-11 DIAGNOSIS — F331 Major depressive disorder, recurrent, moderate: Secondary | ICD-10-CM | POA: Diagnosis not present

## 2020-11-11 MED ORDER — CLONAZEPAM 0.5 MG PO TABS
0.2500 mg | ORAL_TABLET | Freq: Two times a day (BID) | ORAL | 0 refills | Status: DC | PRN
Start: 2020-11-11 — End: 2021-02-03

## 2020-11-11 NOTE — Assessment & Plan Note (Signed)
Chronic and previously well controlled Exacerbated by grief from her husband's death Continue Cymbalta 60 mg daily Contracted for safety-no SI/HI Discussed grieving process Discussed importance of therapy Add short-term low-dose Klonopin as needed as above

## 2020-11-11 NOTE — Assessment & Plan Note (Signed)
Patient is currently grieving the death of her husband She is safe to self with no SI Continue Cymbalta at current dose Okay to take low-dose Klonopin as needed We discussed how this is a temporary measure and not a long-term medication and she is in agreement with that Encouraged grief counseling through hospice as well

## 2020-11-11 NOTE — Progress Notes (Signed)
MyChart Video Visit    Virtual Visit via Video Note   This visit type was conducted due to national recommendations for restrictions regarding the COVID-19 Pandemic (e.g. social distancing) in an effort to limit this patient's exposure and mitigate transmission in our community. This patient is at least at moderate risk for complications without adequate follow up. This format is felt to be most appropriate for this patient at this time. Physical exam was limited by quality of the video and audio technology used for the visit.   Patient location: home Provider location: [place] involved in the visit: patient, provider  I discussed the limitations of evaluation and management by telemedicine and the availability of in person appointments. The patient expressed understanding and agreed to proceed.  Patient: [name] [name]   DOB: 03-31-1951   69 y.o. Female  MRN: [id number] Visit Date: 11/11/2020  Today's healthcare provider: Lavon Paganini, MD   Chief Complaint  Patient presents with  . Anxiety   Subjective    HPI  Anxiety, Follow-up  Current treatment includes Cymablta 60mg  daily.    She reports excellent compliance with treatment. She reports good tolerance of treatment. She is not having side effects.   She feels her anxiety is moderate and Worse since last visit.  Feeling very sad and overwhelmed. Still feels like it isnt real.  Lost her husband on Saturday.  Having difficulty sleeping and decreased appetite.  Symptoms: No chest pain No difficulty concentrating  No dizziness No fatigue  No feelings of losing control Yes insomnia  No irritable No palpitations  No panic attacks No racing thoughts  No shortness of breath No sweating  No tremors/shakes    GAD-7 Results GAD-7 Generalized Anxiety Disorder Screening Tool 11/11/2020  1. Feeling Nervous, Anxious, or on Edge 0  3. Worrying Too Much About Different Things 0  4. Trouble  Relaxing 0  5. Being So Restless it's Hard To Sit Still 0  6. Becoming Easily Annoyed or Irritable 0  7. Feeling Afraid As If Something Awful Might Happen 0  Difficulty At Work, Home, or Getting  Along With Others? Not difficult at all    PHQ-9 Scores PHQ9 SCORE ONLY 11/11/2020 02/03/2020 02/02/2020  PHQ-9 Total Score 17 2 1     ---------------------------------------------------------------------------------------------------  Follow up ER visit  Patient was seen in ER for COVID 11/05/2020. She was treated for COVID-19 infection. Treatment for this included  IV hydration. Also treated with Toradol, Zofran and steroids. She reports good compliance with treatment. She reports this condition is Improved. Patient was asymptomatic.  -----------------------------------------------------------------------------------------   Social History   Tobacco Use  . Smoking status: Former Smoker    Packs/day: 1.00    Years: 29.00    Pack years: 29.00    Quit date: 12/03/1996    Years since quitting: 23.9  . Smokeless tobacco: Never Used  Vaping Use  . Vaping Use: Never used  Substance Use Topics  . Alcohol use: No  . Drug use: No      Medications: Outpatient Medications Prior to Visit  Medication Sig  . clotrimazole-betamethasone (LOTRISONE) cream Apply 1 application topically 2 (two) times daily.  . Dulaglutide 1.5 MG/0.5ML SOPN Inject 1.5 mg into the skin once a week.  . DULoxetine (CYMBALTA) 60 MG capsule Take 1 capsule (60 mg total) by mouth daily.  . naproxen (NAPROSYN) 500 MG tablet Take 1 tablet (500 mg total) by mouth 2 (two) times daily with a meal.  . ondansetron (  ZOFRAN) 4 MG tablet Take 1 tablet (4 mg total) by mouth daily as needed for nausea or vomiting.  . predniSONE (DELTASONE) 10 MG tablet Take 1 tablet (10 mg total) by mouth daily. Day 1-3: take 4 tablets PO daily Day 4-6: take 3 tablets PO daily Day 7-9: take 2 tablets PO daily Day 10-12: take 1 tablet PO daily  .  pregabalin (LYRICA) 75 MG capsule Take 1 capsule (75 mg total) by mouth 3 (three) times daily.  [name] tolterodine (DETROL LA) 4 MG 24 hr capsule Take 1 capsule (4 mg total) by mouth daily.  . cetirizine (ZYRTEC) 10 MG tablet TAKE 1 TABLET (10 MG TOTAL) BY MOUTH DAILY. (NOT COVER*) (Patient not taking: Reported on 11/11/2020)  . Olopatadine HCl 0.2 % SOLN INSTILL 1 DROP INTO AFFECTED EYE EVERY DAY (Patient not taking: Reported on 11/11/2020)   No facility-administered medications prior to visit.    Review of Systems  Constitutional: Positive for appetite change (decrease appetite). Negative for chills, fatigue and fever.  Respiratory: Negative for chest tightness and shortness of breath.   Cardiovascular: Negative for chest pain and palpitations.  Gastrointestinal: Negative for abdominal pain, nausea and vomiting.  Neurological: Negative for dizziness and weakness.       Objective    LMP  (LMP Unknown) Comment: age 75   Physical Exam Constitutional:      General: She is not in acute distress.    Appearance: Normal appearance. She is not diaphoretic.  HENT:     Head: Normocephalic and atraumatic.  Pulmonary:     Effort: Pulmonary effort is normal. No respiratory distress.  Neurological:     Mental Status: She is alert and oriented to person, place, and time.  Psychiatric:        Mood and Affect: Mood is depressed. Affect is tearful.        Speech: Speech normal.        Behavior: Behavior normal.        Thought Content: Thought content does not include homicidal or suicidal ideation.        Assessment & Plan     Problem List Items Addressed This Visit      Other   Grief - Primary    Patient is currently grieving the death of her husband She is safe to self with no SI Continue Cymbalta at current dose Okay to take low-dose Klonopin as needed We discussed how this is a temporary measure and not a long-term medication and she is in agreement with that Encouraged grief  counseling through hospice as well      Moderate episode of recurrent major depressive disorder ([place])    Chronic and previously well controlled Exacerbated by grief from her husband's death Continue Cymbalta 60 mg daily Contracted for safety-no SI/HI Discussed grieving process Discussed importance of therapy Add short-term low-dose Klonopin as needed as above   Return in about 6 weeks (around 12/23/2020) for anxiety and grief f/u, virtual ok.     I discussed the assessment and treatment plan with the patient. The patient was provided an opportunity to ask questions and all were answered. The patient agreed with the plan and demonstrated an understanding of the instructions.   The patient was advised to call back or seek an in-person evaluation if the symptoms worsen or if the condition fails to improve as anticipated.  I, Lavon Paganini, MD, have reviewed all documentation for this visit. The documentation on 11/11/20 for the exam, diagnosis, procedures, and  orders are all accurate and complete.   [name] [name], Dionne Bucy, MD, MPH [facility]

## 2020-11-11 NOTE — Patient Instructions (Signed)
Managing Loss, Adult People experience loss in many different ways throughout their lives. Events such as moving, changing jobs, and losing friends can create a sense of loss. The loss may be as serious as a major health change, divorce, death of a pet, or death of a loved one. All of these types of loss are likely to create a physical and emotional reaction known as grief. Grief is the result of a major change or an absence of something or someone that you count on. Grief is a normal reaction to loss. A variety of factors can affect your grieving experience, including:  The nature of your loss.  Your relationship to what or whom you lost.  Your understanding of grief and how to manage it.  Your support system. How to manage lifestyle changes Keep to your normal routine as much as possible.  If you have trouble focusing or doing normal activities, it is acceptable to take some time away from your normal routine.  Spend time with friends and loved ones.  Eat a healthy diet, get plenty of sleep, and rest when you feel tired. How to recognize changes  The way that you deal with your grief will affect your ability to function as you normally do. When grieving, you may experience these changes:  Numbness, shock, sadness, anxiety, anger, denial, and guilt.  Thoughts about death.  Unexpected crying.  A physical sensation of emptiness in your stomach.  Problems sleeping and eating.  Tiredness (fatigue).  Loss of interest in normal activities.  Dreaming about or imagining seeing the person who died.  A need to remember what or whom you lost.  Difficulty thinking about anything other than your loss for a period of time.  Relief. If you have been expecting the loss for a while, you may feel a sense of relief when it happens. Follow these instructions at home:  Activity Express your feelings in healthy ways, such as:  Talking with others about your loss. It may be helpful to find  others who have had a similar loss, such as a support group.  Writing down your feelings in a journal.  Doing physical activities to release stress and emotional energy.  Doing creative activities like painting, sculpting, or playing or listening to music.  Practicing resilience. This is the ability to recover and adjust after facing challenges. Reading some resources that encourage resilience may help you to learn ways to practice those behaviors. General instructions  Be patient with yourself and others. Allow the grieving process to happen, and remember that grieving takes time. ? It is likely that you may never feel completely done with some grief. You may find a way to move on while still cherishing memories and feelings about your loss. ? Accepting your loss is a process. It can take months or longer to adjust.  Keep all follow-up visits as told by your health care provider. This is important. Where to find support To get support for managing loss:  Ask your health care provider for help and recommendations, such as grief counseling or therapy.  Think about joining a support group for people who are managing a loss. Where to find more information You can find more information about managing loss from:  American Society of Clinical Oncology: www.cancer.net  American Psychological Association: www.apa.org Contact a health care provider if:  Your grief is extreme and keeps getting worse.  You have ongoing grief that does not improve.  Your body shows symptoms of grief, such   as illness.  You feel depressed, anxious, or lonely. Get help right away if:  You have thoughts about hurting yourself or others. If you ever feel like you may hurt yourself or others, or have thoughts about taking your own life, get help right away. You can go to your nearest emergency department or call:  Your local emergency services (911 in the [place]  A suicide crisis helpline, such as the  National Suicide Prevention Lifeline at 1-800-273-8255. This is open 24 hours a day. Summary  Grief is the result of a major change or an absence of someone or something that you count on. Grief is a normal reaction to loss.  The depth of grief and the period of recovery depend on the type of loss and your ability to adjust to the change and process your feelings.  Processing grief requires patience and a willingness to accept your feelings and talk about your loss with people who are supportive.  It is important to find resources that work for you and to realize that people experience grief differently. There is not one grieving process that works for everyone in the same way.  Be aware that when grief becomes extreme, it can lead to more severe issues like isolation, depression, anxiety, or suicidal thoughts. Talk with your health care provider if you have any of these issues. This information is not intended to replace advice given to you by your health care provider. Make sure you discuss any questions you have with your health care provider. Document Revised: 01/23/2019 Document Reviewed: 04/04/2017 Elsevier Patient Education  2020 Elsevier Inc.  

## 2020-12-06 ENCOUNTER — Encounter: Payer: Self-pay | Admitting: Family Medicine

## 2020-12-06 ENCOUNTER — Other Ambulatory Visit: Payer: Self-pay

## 2020-12-06 ENCOUNTER — Ambulatory Visit (INDEPENDENT_AMBULATORY_CARE_PROVIDER_SITE_OTHER): Payer: Medicare Other | Admitting: Family Medicine

## 2020-12-06 VITALS — BP 154/93 | HR 94 | Temp 98.6°F | Ht 68.0 in | Wt 232.0 lb

## 2020-12-06 DIAGNOSIS — D509 Iron deficiency anemia, unspecified: Secondary | ICD-10-CM | POA: Insufficient documentation

## 2020-12-06 DIAGNOSIS — R634 Abnormal weight loss: Secondary | ICD-10-CM

## 2020-12-06 DIAGNOSIS — B372 Candidiasis of skin and nail: Secondary | ICD-10-CM

## 2020-12-06 DIAGNOSIS — E785 Hyperlipidemia, unspecified: Secondary | ICD-10-CM | POA: Diagnosis not present

## 2020-12-06 DIAGNOSIS — I10 Essential (primary) hypertension: Secondary | ICD-10-CM

## 2020-12-06 DIAGNOSIS — D649 Anemia, unspecified: Secondary | ICD-10-CM

## 2020-12-06 DIAGNOSIS — E1169 Type 2 diabetes mellitus with other specified complication: Secondary | ICD-10-CM

## 2020-12-06 DIAGNOSIS — E1142 Type 2 diabetes mellitus with diabetic polyneuropathy: Secondary | ICD-10-CM

## 2020-12-06 DIAGNOSIS — F331 Major depressive disorder, recurrent, moderate: Secondary | ICD-10-CM

## 2020-12-06 DIAGNOSIS — F4321 Adjustment disorder with depressed mood: Secondary | ICD-10-CM

## 2020-12-06 MED ORDER — BUPROPION HCL ER (XL) 150 MG PO TB24: 150.0000 mg | ORAL_TABLET | Freq: Every day | ORAL | 1 refills | Status: DC

## 2020-12-06 NOTE — Progress Notes (Signed)
Established patient visit   Patient: [name] [name]   DOB: [birth year]   70 y.o. Female  MRN: [id number] Visit Date: 12/06/2020  Today's healthcare provider: Lavon Paganini, MD   Chief Complaint  Patient presents with  . Weight Loss    Pt would like a referral to plastic surgery to remove excess skin    Subjective    HPI HPI    Weight Loss     Additional comments: Pt would like a referral to plastic surgery to remove excess skin        Last edited by Kizzie Furnish, CMA on 12/06/2020  1:48 PM. (History)      Has lost close to 120 lbs over the last year intentionally.  She significantly changed her diet. Grief from loss of her husband also contributed.  Continues to have issues with redness and irritation of pannus and under breasts. Would like to see plastics for skin removal. Using lotrisone BID.   Patient Active Problem List   Diagnosis Date Noted  . Weight loss 12/06/2020  . Candidal intertrigo 12/06/2020  . Anemia 12/06/2020  . Polyneuropathy associated with underlying disease ([place]) 02/03/2020  . Moderate episode of recurrent major depressive disorder ([place]) 02/03/2020  . Grief 10/20/2019  . Mixed stress and urge urinary incontinence 08/21/2019  . Avitaminosis D 07/15/2019  . Drug-induced constipation 06/12/2019  . General weakness 07/09/2018  . Lumbar spondylosis 07/08/2018  . Chronic bilateral low back pain with bilateral sciatica 07/08/2018  . Chronic pain syndrome 07/08/2018  . Peripheral neuropathy 03/19/2018  . Urinary incontinence 03/19/2018  . Complex endometrial hyperplasia with atypia 09/03/2017  . Atypical endometrial hyperplasia 08/15/2017  . Lymphedema 01/31/2017  . Hyperlipidemia associated with type 2 diabetes mellitus ([place]) 12/14/2016  . HTN (hypertension) 12/14/2016  . Chronic systolic heart failure ([place]) 10/06/2015  . History of MI (myocardial infarction) 09/20/2015  . Allergic rhinitis 07/06/2015  . Chronic venous insufficiency  07/06/2015  . Atherosclerosis of coronary artery 07/06/2015  . MDD (major depressive disorder) 07/06/2015  . Venous stasis ulcer of ankle with fat layer exposed with varicose veins ([place]) 07/06/2015  . Encounter for long-term (current) use of other medications 07/26/2014  . Diabetes ([place]) 04/08/2014  . Migraines 04/08/2014  . Gastroduodenal ulcer 04/08/2014  . Venous stasis 04/08/2014  . Osteoarthritis 04/08/2014  . Coronary artery disease 04/08/2014  . Morbid obesity ([place]) 04/08/2014  . Peptic ulcer disease 04/08/2014  . Rheumatoid arthritis ([place]) 04/06/2014   Past Medical History:  Diagnosis Date  . Arthritis    Rhumetoid arthritis  . BRCA negative 07/2017   MyRisk neg  . Cancer ([place])    uterine  . Cellulitis of both lower extremities    Chronic  . CHF (congestive heart failure) ([place])   . Coronary artery disease   . Diabetes mellitus without complication ([place])   . Family history of breast cancer 07/2017   MyRisk neg; IBIS=10.5%/riskscore=17%  . Family history of ovarian cancer   . Fibromyalgia   . Fibromyalgia affecting shoulder region   . Hyperlipidemia   . Hypertension   . Spinal stenosis    Social History   Tobacco Use  . Smoking status: Former Smoker    Packs/day: 1.00    Years: 29.00    Pack years: 29.00    Quit date: 12/03/1996    Years since quitting: 24.0  . Smokeless tobacco: Never Used  Vaping Use  . Vaping Use: Never used  Substance Use Topics  . Alcohol  use: No  . Drug use: No   Allergies  Allergen Reactions  . Latex Itching    itching  . Doxycycline Rash  . Sulfa Antibiotics Itching, Rash and Swelling     Medications: Outpatient Medications Prior to Visit  Medication Sig  . cetirizine (ZYRTEC) 10 MG tablet TAKE 1 TABLET (10 MG TOTAL) BY MOUTH DAILY. (NOT COVER*)  . clonazePAM (KLONOPIN) 0.5 MG tablet Take 0.5-1 tablets (0.25-0.5 mg total) by mouth 2 (two) times daily as needed for anxiety.  . clotrimazole-betamethasone (LOTRISONE) cream  Apply 1 application topically 2 (two) times daily.  . Dulaglutide 1.5 MG/0.5ML SOPN Inject 1.5 mg into the skin once a week.  . DULoxetine (CYMBALTA) 60 MG capsule Take 1 capsule (60 mg total) by mouth daily.  . naproxen (NAPROSYN) 500 MG tablet Take 1 tablet (500 mg total) by mouth 2 (two) times daily with a meal.  . Olopatadine HCl 0.2 % SOLN INSTILL 1 DROP INTO AFFECTED EYE EVERY DAY  . tolterodine (DETROL LA) 4 MG 24 hr capsule Take 1 capsule (4 mg total) by mouth daily.  . [DISCONTINUED] ondansetron (ZOFRAN) 4 MG tablet Take 1 tablet (4 mg total) by mouth daily as needed for nausea or vomiting.  . [DISCONTINUED] predniSONE (DELTASONE) 10 MG tablet Take 1 tablet (10 mg total) by mouth daily. Day 1-3: take 4 tablets PO daily Day 4-6: take 3 tablets PO daily Day 7-9: take 2 tablets PO daily Day 10-12: take 1 tablet PO daily  . [DISCONTINUED] pregabalin (LYRICA) 75 MG capsule Take 1 capsule (75 mg total) by mouth 3 (three) times daily. (Patient not taking: Reported on 12/06/2020)   No facility-administered medications prior to visit.    Review of Systems  Constitutional: Negative.   Respiratory: Negative.   Cardiovascular: Negative.   Gastrointestinal: Positive for constipation. Negative for abdominal distention, abdominal pain, anal bleeding, blood in stool, diarrhea, nausea, rectal pain and vomiting.  Neurological: Negative for dizziness, light-headedness and headaches.      Objective    BP (!) 154/93 (BP Location: Left Wrist, Patient Position: Sitting, Cuff Size: Normal)   Pulse 94   Temp 98.6 F (37 C) (Oral)   Ht _0  (1.727 m)   Wt 232 lb (105.2 kg)   LMP  (LMP Unknown) Comment: age 60  BMI 35.28 kg/m    Physical Exam Vitals reviewed.  Constitutional:      General: She is not in acute distress.    Appearance: Normal appearance. She is well-developed. She is not diaphoretic.  HENT:     Head: Normocephalic and atraumatic.  Eyes:     General: No scleral icterus.     Conjunctiva/sclera: Conjunctivae normal.  Neck:     Thyroid: No thyromegaly.  Cardiovascular:     Rate and Rhythm: Normal rate and regular rhythm.     Pulses: Normal pulses.     Heart sounds: Normal heart sounds. No murmur heard.   Pulmonary:     Effort: Pulmonary effort is normal. No respiratory distress.     Breath sounds: Normal breath sounds. No wheezing, rhonchi or rales.  Abdominal:     General: There is no distension.     Palpations: Abdomen is soft.     Tenderness: There is no abdominal tenderness.     Comments: Large pannus  Musculoskeletal:     Cervical back: Neck supple.     Right lower leg: No edema.     Left lower leg: No edema.  Lymphadenopathy:  Cervical: No cervical adenopathy.  Skin:    General: Skin is warm and dry.     Findings: Rash (erythema under pannus and breasts) present.  Neurological:     Mental Status: She is alert and oriented to person, place, and time. Mental status is at baseline.  Psychiatric:        Mood and Affect: Mood normal.        Behavior: Behavior normal.       No results found for any visits on 12/06/20.  Assessment & Plan     Problem List Items Addressed This Visit      Cardiovascular and Mediastinum   HTN (hypertension)    Upcoming follow-up in 3 weeks or so Labs ordered today to check before appointment      Relevant Orders   Comprehensive metabolic panel     Endocrine   Diabetes ([place])    Upcoming follow-up in 3 weeks Labs ordered today to check for appointment Patient reports she has stopped her medications      Relevant Orders   Hemoglobin A1c   Hyperlipidemia associated with type 2 diabetes mellitus ([place])    Upcoming follow-up appointment Labs ordered today to check before appointment      Relevant Orders   Comprehensive metabolic panel   Lipid panel     Musculoskeletal and Integument   Candidal intertrigo    Recurrent issue due to skin folds under pannus and breast Patient has had significant  weight loss and would like to consider removal of excess skin to help with recurrent candidal infections Referral to plastic surgery placed today Continue Lotrisone twice daily as needed      Relevant Orders   Ambulatory referral to Plastic Surgery     Other   Grief    Continues to grieve the death of her husband and subsequently her fianc She is safe to self with no SI Continue Cymbalta at current dose Add Wellbutrin for depression as below Okay to take low-dose Klonopin sparingly She is aware this is a temporary measure and not a long-term medication Encouraged grief counseling or therapy      Moderate episode of recurrent major depressive disorder ([place])    Chronic with some worsening of the setting of grief Good family support in the setting of grief Continue Cymbalta at current dose Add Wellbutrin XL 150 mg daily Encourage therapy Follow-up at next visit      Relevant Medications   buPROPion (WELLBUTRIN XL) 150 MG 24 hr tablet   Weight loss - Primary    Congratulated on weight loss She did lose some weight while grieving the death of her husband, but subsequently initiated significant diet and lifestyle changes to continue with her weight loss She has lost more than 120 pounds As above, she has significant excess skin as a result of this Referral to plastic surgery as above      Relevant Orders   Ambulatory referral to Plastic Surgery   Anemia    Noted on 11/2020 labs No recent bleeding the patient is aware of Recheck CBC prior to upcoming follow-up appointment      Relevant Orders   CBC w/Diff/Platelet       Return in about 3 weeks (around 12/27/2020) for as scheduled, chronic disease f/u.      I, Lavon Paganini, MD, have reviewed all documentation for this visit. The documentation on 12/06/20 for the exam, diagnosis, procedures, and orders are all accurate and complete.   Virginia Crews, MD,  [facility]

## 2020-12-06 NOTE — Assessment & Plan Note (Signed)
Upcoming follow-up in 3 weeks Labs ordered today to check for appointment Patient reports she has stopped her medications

## 2020-12-06 NOTE — Assessment & Plan Note (Signed)
Chronic with some worsening of the setting of grief Good family support in the setting of grief Continue Cymbalta at current dose Add Wellbutrin XL 150 mg daily Encourage therapy Follow-up at next visit

## 2020-12-06 NOTE — Assessment & Plan Note (Signed)
Noted on 11/2020 labs No recent bleeding the patient is aware of Recheck CBC prior to upcoming follow-up appointment

## 2020-12-06 NOTE — Assessment & Plan Note (Signed)
Congratulated on weight loss She did lose some weight while grieving the death of her husband, but subsequently initiated significant diet and lifestyle changes to continue with her weight loss She has lost more than 120 pounds As above, she has significant excess skin as a result of this Referral to plastic surgery as above

## 2020-12-06 NOTE — Assessment & Plan Note (Signed)
Continues to grieve the death of her husband and subsequently her fianc She is safe to self with no SI Continue Cymbalta at current dose Add Wellbutrin for depression as below Okay to take low-dose Klonopin sparingly She is aware this is a temporary measure and not a long-term medication Encouraged grief counseling or therapy

## 2020-12-06 NOTE — Assessment & Plan Note (Signed)
Upcoming follow-up appointment Labs ordered today to check before appointment

## 2020-12-06 NOTE — Assessment & Plan Note (Signed)
Upcoming follow-up in 3 weeks or so Labs ordered today to check before appointment

## 2020-12-06 NOTE — Assessment & Plan Note (Signed)
Recurrent issue due to skin folds under pannus and breast Patient has had significant weight loss and would like to consider removal of excess skin to help with recurrent candidal infections Referral to plastic surgery placed today Continue Lotrisone twice daily as needed

## 2020-12-21 DIAGNOSIS — E1169 Type 2 diabetes mellitus with other specified complication: Secondary | ICD-10-CM | POA: Diagnosis not present

## 2020-12-21 DIAGNOSIS — I10 Essential (primary) hypertension: Secondary | ICD-10-CM | POA: Diagnosis not present

## 2020-12-21 DIAGNOSIS — D649 Anemia, unspecified: Secondary | ICD-10-CM | POA: Diagnosis not present

## 2020-12-21 DIAGNOSIS — E785 Hyperlipidemia, unspecified: Secondary | ICD-10-CM | POA: Diagnosis not present

## 2020-12-21 DIAGNOSIS — E1142 Type 2 diabetes mellitus with diabetic polyneuropathy: Secondary | ICD-10-CM | POA: Diagnosis not present

## 2020-12-22 LAB — COMPREHENSIVE METABOLIC PANEL
ALT: 8 IU/L (ref 0–32)
AST: 14 IU/L (ref 0–40)
Albumin/Globulin Ratio: 1.1 — ABNORMAL LOW (ref 1.2–2.2)
Albumin: 3.8 g/dL (ref 3.8–4.8)
Alkaline Phosphatase: 100 IU/L (ref 44–121)
BUN/Creatinine Ratio: 16 (ref 12–28)
BUN: 15 mg/dL (ref 8–27)
Bilirubin Total: 0.3 mg/dL (ref 0.0–1.2)
CO2: 24 mmol/L (ref 20–29)
Calcium: 9.5 mg/dL (ref 8.7–10.3)
Chloride: 100 mmol/L (ref 96–106)
Creatinine, Ser: 0.95 mg/dL (ref 0.57–1.00)
GFR calc Af Amer: 72 mL/min/{1.73_m2} (ref >59)
GFR calc non Af Amer: 62 mL/min/{1.73_m2} (ref >59)
Globulin, Total: 3.4 g/dL (ref 1.5–4.5)
Glucose: 137 mg/dL — ABNORMAL HIGH (ref 65–99)
Potassium: 4.9 mmol/L (ref 3.5–5.2)
Sodium: 137 mmol/L (ref 134–144)
Total Protein: 7.2 g/dL (ref 6.0–8.5)

## 2020-12-22 LAB — CBC WITH DIFFERENTIAL/PLATELET
Basophils Absolute: 0 10*3/uL (ref 0.0–0.2)
Basos: 1 %
EOS (ABSOLUTE): 0.2 10*3/uL (ref 0.0–0.4)
Eos: 3 %
Hematocrit: 33.2 % — ABNORMAL LOW (ref 34.0–46.6)
Hemoglobin: 10.3 g/dL — ABNORMAL LOW (ref 11.1–15.9)
Immature Grans (Abs): 0 10*3/uL (ref 0.0–0.1)
Immature Granulocytes: 0 %
Lymphocytes Absolute: 1.1 10*3/uL (ref 0.7–3.1)
Lymphs: 20 %
MCH: 24.3 pg — ABNORMAL LOW (ref 26.6–33.0)
MCHC: 31 g/dL — ABNORMAL LOW (ref 31.5–35.7)
MCV: 79 fL (ref 79–97)
Monocytes Absolute: 0.5 10*3/uL (ref 0.1–0.9)
Monocytes: 9 %
Neutrophils Absolute: 3.6 10*3/uL (ref 1.4–7.0)
Neutrophils: 67 %
Platelets: 323 10*3/uL (ref 150–450)
RBC: 4.23 x10E6/uL (ref 3.77–5.28)
RDW: 15.2 % (ref 11.7–15.4)
WBC: 5.4 10*3/uL (ref 3.4–10.8)

## 2020-12-22 LAB — LIPID PANEL
Chol/HDL Ratio: 6.5 ratio — ABNORMAL HIGH (ref 0.0–4.4)
Cholesterol, Total: 305 mg/dL — ABNORMAL HIGH (ref 100–199)
HDL: 47 mg/dL (ref >39)
LDL Chol Calc (NIH): 222 mg/dL — ABNORMAL HIGH (ref 0–99)
Triglycerides: 186 mg/dL — ABNORMAL HIGH (ref 0–149)
VLDL Cholesterol Cal: 36 mg/dL (ref 5–40)

## 2020-12-22 LAB — HEMOGLOBIN A1C
Est. average glucose Bld gHb Est-mCnc: 157 mg/dL
Hgb A1c MFr Bld: 7.1 % — ABNORMAL HIGH (ref 4.8–5.6)

## 2020-12-23 ENCOUNTER — Ambulatory Visit: Payer: Medicare Other | Admitting: Family Medicine

## 2020-12-26 ENCOUNTER — Telehealth (INDEPENDENT_AMBULATORY_CARE_PROVIDER_SITE_OTHER): Payer: Medicare Other | Admitting: Family Medicine

## 2020-12-26 ENCOUNTER — Encounter: Payer: Self-pay | Admitting: Family Medicine

## 2020-12-26 VITALS — BP 184/79 | HR 74 | Ht 68.0 in | Wt 221.0 lb

## 2020-12-26 DIAGNOSIS — I152 Hypertension secondary to endocrine disorders: Secondary | ICD-10-CM | POA: Diagnosis not present

## 2020-12-26 DIAGNOSIS — E1169 Type 2 diabetes mellitus with other specified complication: Secondary | ICD-10-CM

## 2020-12-26 DIAGNOSIS — E785 Hyperlipidemia, unspecified: Secondary | ICD-10-CM | POA: Diagnosis not present

## 2020-12-26 DIAGNOSIS — T466X5A Adverse effect of antihyperlipidemic and antiarteriosclerotic drugs, initial encounter: Secondary | ICD-10-CM

## 2020-12-26 DIAGNOSIS — E1142 Type 2 diabetes mellitus with diabetic polyneuropathy: Secondary | ICD-10-CM | POA: Diagnosis not present

## 2020-12-26 DIAGNOSIS — F331 Major depressive disorder, recurrent, moderate: Secondary | ICD-10-CM | POA: Diagnosis not present

## 2020-12-26 DIAGNOSIS — L650 Telogen effluvium: Secondary | ICD-10-CM

## 2020-12-26 DIAGNOSIS — M791 Myalgia, unspecified site: Secondary | ICD-10-CM | POA: Diagnosis not present

## 2020-12-26 DIAGNOSIS — E1159 Type 2 diabetes mellitus with other circulatory complications: Secondary | ICD-10-CM | POA: Diagnosis not present

## 2020-12-26 MED ORDER — LISINOPRIL 20 MG PO TABS: 20.0000 mg | ORAL_TABLET | Freq: Every day | ORAL | 3 refills | Status: DC

## 2020-12-26 MED ORDER — EZETIMIBE 10 MG PO TABS: 10.0000 mg | ORAL_TABLET | Freq: Every day | ORAL | 3 refills | Status: DC

## 2020-12-26 MED ORDER — TRAZODONE HCL 50 MG PO TABS: 50.0000 mg | ORAL_TABLET | Freq: Every evening | ORAL | 3 refills | Status: DC | PRN

## 2020-12-26 NOTE — Assessment & Plan Note (Signed)
Not quite to goal, but significantly improved since last A1c Recent A1c 7.1 Discussed goal A1c less than 7 We will not add medication back at this time, because patient would like to continue working on lifestyle interventions to lower this further without medications She is due for upcoming eye exam Foot exam at next visit Repeat A1c in 3 months Starting lisinopril for blood pressure as above

## 2020-12-26 NOTE — Assessment & Plan Note (Addendum)
Chronic and stable Still grieving the loss of her fianc Good family support in the setting of grief Encourage therapy Continue Cymbalta and Wellbutrin at current dose Trial of trazodone prn for sleep Follow-up at next visit repeat PHQ-9 and GAD-7

## 2020-12-26 NOTE — Assessment & Plan Note (Signed)
Remains uncontrolled High stress levels may be contributing Continue diet and exercise Add lisinopril 20 mg daily Recheck BMP at f/u in 1 month

## 2020-12-26 NOTE — Assessment & Plan Note (Signed)
Patient reports all over hair loss since having Covid about 6 weeks ago Discussed natural course and how this is common after Covid Continue multivitamin

## 2020-12-26 NOTE — Assessment & Plan Note (Signed)
Uncontrolled with LDL greater than 200 on recent check Has not tolerated statins due to myalgias Trial of Zetia 10 mg daily Repeat CMP and FLP in 3 months

## 2020-12-26 NOTE — Assessment & Plan Note (Signed)
As above, has not tolerated statins due to myalgias, so we will try Zetia instead

## 2020-12-26 NOTE — Progress Notes (Signed)
MyChart Video Visit    Virtual Visit via Video Note   This visit type was conducted due to national recommendations for restrictions regarding the COVID-19 Pandemic (e.g. social distancing) in an effort to limit this patient's exposure and mitigate transmission in our community. This patient is at least at moderate risk for complications without adequate follow up. This format is felt to be most appropriate for this patient at this time. Physical exam was limited by quality of the video and audio technology used for the visit.    Patient location: home Provider location: home office Persons involved in the visit: patient, provider  I discussed the limitations of evaluation and management by telemedicine and the availability of in person appointments. The patient expressed understanding and agreed to proceed.  Interactive audio and video communications were attempted, although failed due to patient's inability to connect to video. Continued visit with audio only interaction with patient agreement.   Patient: [name] [name]   DOB: 1951-11-30   70 y.o. Female  MRN: [id number] Visit Date: 12/26/2020  Today's healthcare provider: Lavon Paganini, MD   Chief Complaint  Patient presents with  . Diabetes  . Hypertension   Subjective    HPI  Diabetes Mellitus Type II, Follow-up  Lab Results  Component Value Date   HGBA1C 7.1 (H) 12/21/2020   HGBA1C 9.8 (H) 02/03/2020   HGBA1C 10.4 (H) 10/19/2019   Wt Readings from Last 3 Encounters:  12/26/20 221 lb (100.2 kg)  12/06/20 232 lb (105.2 kg)  11/05/20 230 lb (104.3 kg)   Last seen for diabetes 1year ago.  Management since then includes checking labs.  Symptoms: No fatigue No foot ulcerations  No appetite changes No nausea  No paresthesia of the feet  No polydipsia  No polyuria No visual disturbances   No vomiting     Home blood sugar records: not being checked  Episodes of hypoglycemia? No   Current insulin  regiment: none Most Recent Eye Exam: UTD Current exercise: none Current diet habits: in general, a "healthy" diet    Pertinent Labs: Lab Results  Component Value Date   CHOL 305 (H) 12/21/2020   HDL 47 12/21/2020   LDLCALC 222 (H) 12/21/2020   LDLDIRECT 183.0 12/13/2016   TRIG 186 (H) 12/21/2020   CHOLHDL 6.5 (H) 12/21/2020   Lab Results  Component Value Date   NA 137 12/21/2020   K 4.9 12/21/2020   CREATININE 0.95 12/21/2020   GFRNONAA 62 12/21/2020   GFRAA 72 12/21/2020   GLUCOSE 137 (H) 12/21/2020     --------------------------------------------------------------------------------------------------- Depression, Follow-up  She  was last seen for this 3 weeks ago. Changes made at last visit include add Wellbutrin XL 150 mg daily, continue Cymbalta daily and Klonopin sparingly.   She reports excellent compliance with treatment. Patient reports she is not taking Klonopin anymore. She is not having side effects.  She reports excellent tolerance of treatment. Current symptoms include: depressed mood, fatigue and insomnia She feels she is Unchanged since last visit.  Depression screen [facility] 2/9 12/26/2020 12/06/2020 11/11/2020  Decreased Interest 3 0 3  Down, Depressed, Hopeless 3 1 3   PHQ - 2 Score 6 1 6   Altered sleeping 2 1 3   Tired, decreased energy 0 2 3  Change in appetite 0 1 2  Feeling bad or failure about yourself  0 0 0  Trouble concentrating 1 2 3   Moving slowly or fidgety/restless 0 0 0  Suicidal thoughts 0 0 0  PHQ-9  Score 9 7 17   Difficult doing work/chores Not difficult at all Somewhat difficult Not difficult at all  Some recent data might be hidden    ----------------------------------------------------------------------------------------- Hypertension, follow-up  BP Readings from Last 3 Encounters:  12/26/20 (!) 184/79  12/06/20 (!) 154/93  11/05/20 136/74   Wt Readings from Last 3 Encounters:  12/26/20 221 lb (100.2 kg)  12/06/20 232 lb (105.2 kg)   11/05/20 230 lb (104.3 kg)     She was last seen for hypertension 3 weeks ago.  BP at that visit was 154/93. Management since that visit includes check labs.  She is following a Regular diet. She is not exercising. She does not smoke.  Use of agents associated with hypertension: none.   Outside blood pressures are elevated. Symptoms: No chest pain No chest pressure  No palpitations No syncope  No dyspnea No orthopnea  No paroxysmal nocturnal dyspnea No lower extremity edema   Pertinent labs: Lab Results  Component Value Date   CHOL 305 (H) 12/21/2020   HDL 47 12/21/2020   LDLCALC 222 (H) 12/21/2020   LDLDIRECT 183.0 12/13/2016   TRIG 186 (H) 12/21/2020   CHOLHDL 6.5 (H) 12/21/2020   Lab Results  Component Value Date   NA 137 12/21/2020   K 4.9 12/21/2020   CREATININE 0.95 12/21/2020   GFRNONAA 62 12/21/2020   GFRAA 72 12/21/2020   GLUCOSE 137 (H) 12/21/2020     The ASCVD Risk score (Goff DC Jr., et al., 2013) failed to calculate for the following reasons:   The patient has a prior MI or stroke diagnosis   ---------------------------------------------------------------------------------------------------   Patient Active Problem List   Diagnosis Date Noted  . Myalgia due to statin 12/26/2020  . Telogen effluvium 12/26/2020  . Weight loss 12/06/2020  . Candidal intertrigo 12/06/2020  . Anemia 12/06/2020  . Polyneuropathy associated with underlying disease ([place]) 02/03/2020  . Moderate episode of recurrent major depressive disorder ([place]) 02/03/2020  . Grief 10/20/2019  . Mixed stress and urge urinary incontinence 08/21/2019  . Avitaminosis D 07/15/2019  . Drug-induced constipation 06/12/2019  . General weakness 07/09/2018  . Lumbar spondylosis 07/08/2018  . Chronic bilateral low back pain with bilateral sciatica 07/08/2018  . Chronic pain syndrome 07/08/2018  . Peripheral neuropathy 03/19/2018  . Urinary incontinence 03/19/2018  . Complex endometrial  hyperplasia with atypia 09/03/2017  . Atypical endometrial hyperplasia 08/15/2017  . Lymphedema 01/31/2017  . Hyperlipidemia associated with type 2 diabetes mellitus ([place]) 12/14/2016  . HTN (hypertension) 12/14/2016  . Chronic systolic heart failure ([place]) 10/06/2015  . History of MI (myocardial infarction) 09/20/2015  . Allergic rhinitis 07/06/2015  . Chronic venous insufficiency 07/06/2015  . Atherosclerosis of coronary artery 07/06/2015  . MDD (major depressive disorder) 07/06/2015  . Encounter for long-term (current) use of other medications 07/26/2014  . Diabetes ([place]) 04/08/2014  . Migraines 04/08/2014  . Gastroduodenal ulcer 04/08/2014  . Venous stasis 04/08/2014  . Osteoarthritis 04/08/2014  . Coronary artery disease 04/08/2014  . Morbid obesity ([place]) 04/08/2014  . Peptic ulcer disease 04/08/2014  . Rheumatoid arthritis ([place]) 04/06/2014   Social History   Tobacco Use  . Smoking status: Former Smoker    Packs/day: 1.00    Years: 29.00    Pack years: 29.00    Quit date: 12/03/1996    Years since quitting: 24.0  . Smokeless tobacco: Never Used  Vaping Use  . Vaping Use: Never used  Substance Use Topics  . Alcohol use: No  . Drug use: No  Allergies  Allergen Reactions  . Latex Itching    itching  . Doxycycline Rash  . Sulfa Antibiotics Itching, Rash and Swelling      Medications: Outpatient Medications Prior to Visit  Medication Sig  . buPROPion (WELLBUTRIN XL) 150 MG 24 hr tablet Take 1 tablet (150 mg total) by mouth daily.  . clotrimazole-betamethasone (LOTRISONE) cream Apply 1 application topically 2 (two) times daily.  . DULoxetine (CYMBALTA) 60 MG capsule Take 1 capsule (60 mg total) by mouth daily.  [name] tolterodine (DETROL LA) 4 MG 24 hr capsule Take 1 capsule (4 mg total) by mouth daily.  . [DISCONTINUED] cetirizine (ZYRTEC) 10 MG tablet TAKE 1 TABLET (10 MG TOTAL) BY MOUTH DAILY. (NOT COVER*) (Patient taking differently: as needed. TAKE 1 TABLET (10 MG  TOTAL) BY MOUTH DAILY. (NOT COVER*))  . clonazePAM (KLONOPIN) 0.5 MG tablet Take 0.5-1 tablets (0.25-0.5 mg total) by mouth 2 (two) times daily as needed for anxiety. (Patient not taking: Reported on 12/26/2020)  . [DISCONTINUED] Dulaglutide 1.5 MG/0.5ML SOPN Inject 1.5 mg into the skin once a week. (Patient not taking: Reported on 12/26/2020)  . [DISCONTINUED] naproxen (NAPROSYN) 500 MG tablet Take 1 tablet (500 mg total) by mouth 2 (two) times daily with a meal. (Patient not taking: Reported on 12/26/2020)  . [DISCONTINUED] Olopatadine HCl 0.2 % SOLN INSTILL 1 DROP INTO AFFECTED EYE EVERY DAY (Patient not taking: Reported on 12/26/2020)   No facility-administered medications prior to visit.    Review of Systems  Constitutional: Positive for activity change, appetite change and fatigue.  Respiratory: Negative for cough, chest tightness and shortness of breath.   Cardiovascular: Negative for chest pain and palpitations.  Gastrointestinal: Negative for abdominal distention, nausea and vomiting.  Psychiatric/Behavioral: Positive for decreased concentration and sleep disturbance. Negative for agitation, behavioral problems, confusion and suicidal ideas. The patient is nervous/anxious.     Last CBC Lab Results  Component Value Date   WBC 5.4 12/21/2020   HGB 10.3 (L) 12/21/2020   HCT 33.2 (L) 12/21/2020   MCV 79 12/21/2020   MCH 24.3 (L) 12/21/2020   RDW 15.2 12/21/2020   PLT 323 12/21/2020   Last thyroid functions Lab Results  Component Value Date   TSH 2.160 07/18/2018   Last vitamin D Lab Results  Component Value Date   VD25OH 33.0 02/03/2020   Last vitamin B12 and Folate Lab Results  Component Value Date   VITAMINB12 326 07/18/2018      Objective    BP (!) 184/79 (BP Location: Left Arm)   Pulse 74   Ht 5\' 8"  (1.727 m)   Wt 221 lb (100.2 kg)   LMP  (LMP Unknown) Comment: age 61  BMI 33.60 kg/m  BP Readings from Last 3 Encounters:  12/26/20 (!) 184/79  12/06/20 (!)  154/93  11/05/20 136/74   Wt Readings from Last 3 Encounters:  12/26/20 221 lb (100.2 kg)  12/06/20 232 lb (105.2 kg)  11/05/20 230 lb (104.3 kg)      Physical Exam  Speaking in full sentences with no resp distress   Assessment & Plan     Problem List Items Addressed This Visit      Cardiovascular and Mediastinum   HTN (hypertension)    Remains uncontrolled High stress levels may be contributing Continue diet and exercise Add lisinopril 20 mg daily Recheck BMP at f/u in 1 month      Relevant Medications   lisinopril (ZESTRIL) 20 MG tablet   ezetimibe (ZETIA) 10 MG  tablet     Endocrine   Diabetes ([place]) - Primary    Not quite to goal, but significantly improved since last A1c Recent A1c 7.1 Discussed goal A1c less than 7 We will not add medication back at this time, because patient would like to continue working on lifestyle interventions to lower this further without medications She is due for upcoming eye exam Foot exam at next visit Repeat A1c in 3 months Starting lisinopril for blood pressure as above      Relevant Medications   lisinopril (ZESTRIL) 20 MG tablet   Hyperlipidemia associated with type 2 diabetes mellitus (HCC)    Uncontrolled with LDL greater than 200 on recent check Has not tolerated statins due to myalgias Trial of Zetia 10 mg daily Repeat CMP and FLP in 3 months      Relevant Medications   lisinopril (ZESTRIL) 20 MG tablet   ezetimibe (ZETIA) 10 MG tablet     Musculoskeletal and Integument   Telogen effluvium    Patient reports all over hair loss since having Covid about 6 weeks ago Discussed natural course and how this is common after Covid Continue multivitamin        Other   Moderate episode of recurrent major depressive disorder ([place])    Chronic and stable Still grieving the loss of her fianc Good family support in the setting of grief Encourage therapy Continue Cymbalta and Wellbutrin at current dose Trial of trazodone  prn for sleep Follow-up at next visit repeat PHQ-9 and GAD-7      Relevant Medications   traZODone (DESYREL) 50 MG tablet   Myalgia due to statin    As above, has not tolerated statins due to myalgias, so we will try Zetia instead          Return in about 4 weeks (around 01/23/2021) for chronic disease f/u.     I discussed the assessment and treatment plan with the patient. The patient was provided an opportunity to ask questions and all were answered. The patient agreed with the plan and demonstrated an understanding of the instructions.   The patient was advised to call back or seek an in-person evaluation if the symptoms worsen or if the condition fails to improve as anticipated.  I provided 25 minutes of non-face-to-face time during this encounter.  I, Lavon Paganini, MD, have reviewed all documentation for this visit. The documentation on 12/26/20 for the exam, diagnosis, procedures, and orders are all accurate and complete.   Bacigalupo, Dionne Bucy, MD, MPH [facility]

## 2020-12-31 ENCOUNTER — Other Ambulatory Visit: Payer: Self-pay | Admitting: Family Medicine

## 2020-12-31 NOTE — Telephone Encounter (Signed)
Requested Prescriptions  Pending Prescriptions Disp Refills  . buPROPion (WELLBUTRIN XL) 150 MG 24 hr tablet [Pharmacy Med Name: BUPROPION HCL XL 150 MG TABLET] 90 tablet 0    Sig: TAKE 1 TABLET BY MOUTH EVERY DAY     Psychiatry: Antidepressants - bupropion Failed - 12/31/2020 11:31 AM      Failed - Last BP in normal range    BP Readings from Last 1 Encounters:  12/26/20 (!) 184/79         Passed - Completed PHQ-2 or PHQ-9 in the last 360 days      Passed - Valid encounter within last 6 months    Recent Outpatient Visits          5 days ago Type 2 diabetes mellitus with diabetic polyneuropathy, without long-term current use of insulin ([place])   [facility] [place], Dionne Bucy, MD   3 weeks ago Weight loss   [facility], Dionne Bucy, MD   1 month ago Grief   [facility], Dionne Bucy, MD   2 months ago Candidal intertrigo   [facility] [place], Dionne Bucy, MD   11 months ago Encounter for annual physical exam   [facility], Dionne Bucy, MD      Future Appointments            In 3 weeks Bacigalupo, Dionne Bucy, MD [facility] [place]   In 45 month [place], Nicki Reaper, [facility]

## 2021-01-04 ENCOUNTER — Other Ambulatory Visit: Payer: Self-pay | Admitting: Family Medicine

## 2021-01-04 NOTE — Telephone Encounter (Signed)
Non delegated refill request for Lyrica. 

## 2021-01-06 ENCOUNTER — Telehealth: Payer: Self-pay | Admitting: Family Medicine

## 2021-01-06 NOTE — Telephone Encounter (Signed)
Called pharmacy to confirm that medication is available. Pharmacy staff states that prescription was filled on 12/31/20 and is ready for pick up.  Patient called and notified that medication is available for pick up. Understanding verbalized. Patient states that when she called and spoke with someone earlier at the pharmacy she was told that they did not have the reference number for the medication. Patient states that she will go to pharmacy to pick up medication.

## 2021-01-06 NOTE — Telephone Encounter (Signed)
Copied from [place] [id number]. Topic: Quick Communication - Rx Refill/Question >> Jan 06, 2021 12:00 PM Coley, Everette A wrote: Medication: buPROPion (WELLBUTRIN XL) 150 MG 24 hr tablet   Has the patient contacted their pharmacy? Yes. Patient was directed to contact PCP  Preferred Pharmacy (with phone number or street name): [id number] - [place], [place]  Phone:  [phone]  Agent: Please be advised that RX refills may take up to 3 business days. We ask that you follow-up with your pharmacy.

## 2021-01-18 ENCOUNTER — Other Ambulatory Visit: Payer: Self-pay | Admitting: Family Medicine

## 2021-01-26 ENCOUNTER — Ambulatory Visit: Payer: Medicare Other | Admitting: Family Medicine

## 2021-01-30 ENCOUNTER — Ambulatory Visit: Payer: Self-pay | Admitting: Urology

## 2021-02-03 ENCOUNTER — Other Ambulatory Visit: Payer: Self-pay

## 2021-02-03 ENCOUNTER — Ambulatory Visit (INDEPENDENT_AMBULATORY_CARE_PROVIDER_SITE_OTHER): Payer: Medicare Other | Admitting: Plastic Surgery

## 2021-02-03 ENCOUNTER — Encounter: Payer: Self-pay | Admitting: Plastic Surgery

## 2021-02-03 DIAGNOSIS — M0579 Rheumatoid arthritis with rheumatoid factor of multiple sites without organ or systems involvement: Secondary | ICD-10-CM | POA: Diagnosis not present

## 2021-02-03 DIAGNOSIS — M5441 Lumbago with sciatica, right side: Secondary | ICD-10-CM | POA: Diagnosis not present

## 2021-02-03 DIAGNOSIS — M5442 Lumbago with sciatica, left side: Secondary | ICD-10-CM | POA: Diagnosis not present

## 2021-02-03 DIAGNOSIS — I5022 Chronic systolic (congestive) heart failure: Secondary | ICD-10-CM | POA: Diagnosis not present

## 2021-02-03 DIAGNOSIS — G8929 Other chronic pain: Secondary | ICD-10-CM | POA: Diagnosis not present

## 2021-02-03 DIAGNOSIS — I872 Venous insufficiency (chronic) (peripheral): Secondary | ICD-10-CM

## 2021-02-03 DIAGNOSIS — M793 Panniculitis, unspecified: Secondary | ICD-10-CM | POA: Diagnosis not present

## 2021-02-03 NOTE — Progress Notes (Signed)
Patient ID: [name] [name], female    DOB: Oct 02, 1951, 70 y.o.   MRN: [id number]   Chief Complaint  Patient presents with  . Advice Only    The patient is a 70 year old female here with her sister for evaluation of her abdomen.  She is 5 feet 8 inches tall and weighs 228 pounds.  Over the last year she has lost over 130 pounds with her starting weight of 360 pounds.  She lost her husband a couple years ago and then lost her boyfriend in the past year.  She has multiple medical conditions including rheumatoid arthritis, hypertension and diabetes.  Her hemoglobin A1c from January 2022 was 7.1.  She has a very large pendulous pannus.  She has extreme chafing and irritation of her skin with constant yeast infections.  She treats it with some steroid cream which helps a little bit.  She has multiple bruising of her skin probably related to age and the steroid cream use.  She complains of back pain as well.  It has improved a little bit with her weight loss.  She is not currently on any rheumatoid medication.   Review of Systems  Constitutional: Negative.   HENT: Negative.   Eyes: Negative.   Respiratory: Negative.   Endocrine: Negative.   Genitourinary: Negative.   Musculoskeletal: Positive for back pain.  Skin: Negative.   Hematological: Negative.     Past Medical History:  Diagnosis Date  . Arthritis    Rhumetoid arthritis  . BRCA negative 07/2017   MyRisk neg  . Cancer ([place])    uterine  . Cellulitis of both lower extremities    Chronic  . CHF (congestive heart failure) ([place])   . Coronary artery disease   . Diabetes mellitus without complication ([place])   . Family history of breast cancer 07/2017   MyRisk neg; IBIS=10.5%/riskscore=17%  . Family history of ovarian cancer   . Fibromyalgia   . Fibromyalgia affecting shoulder region   . Hyperlipidemia   . Hypertension   . Spinal stenosis     Past Surgical History:  Procedure Laterality Date  . ABLATION SAPHENOUS VEIN W/ RFA     . ANAL FISSURECTOMY  01/2004  . CORONARY ANGIOPLASTY    . CYSTOSCOPY  09/03/2017   Procedure: CYSTOSCOPY;  Surgeon: Gae Dry, MD;  Location: [facility] ORS;  Service: Gynecology;;  . DILATION AND CURETTAGE OF UTERUS    . HERNIA REPAIR    . LAPAROSCOPIC HYSTERECTOMY N/A 09/03/2017   Procedure: HYSTERECTOMY TOTAL LAPAROSCOPIC BSO;  Surgeon: Gae Dry, MD;  Location: [facility] ORS;  Service: Gynecology;  Laterality: N/A;  . MEDIAL PARTIAL KNEE REPLACEMENT Left 01/06/2014  . UMBILICAL HERNIA REPAIR N/A 09/05/2015   Procedure: HERNIA REPAIR incarcerated UMBILICAL ADULT;  Surgeon: Sherri Rad, MD;  Location: [facility] ORS;  Service: General;  Laterality: N/A;  . Vascular Stent  11/04/2013      Current Outpatient Medications:  .  clotrimazole-betamethasone (LOTRISONE) cream, Apply 1 application topically 2 (two) times daily., Disp: 90 g, Rfl: 5 .  DULoxetine (CYMBALTA) 60 MG capsule, Take 1 capsule (60 mg total) by mouth daily., Disp: 90 capsule, Rfl: 1 .  ezetimibe (ZETIA) 10 MG tablet, Take 1 tablet (10 mg total) by mouth daily., Disp: 90 tablet, Rfl: 3 .  lisinopril (ZESTRIL) 20 MG tablet, Take 1 tablet (20 mg total) by mouth daily., Disp: 30 tablet, Rfl: 3 .  pregabalin (LYRICA) 75 MG capsule, TAKE 1 CAPSULE BY MOUTH 3 TIMES  DAILY., Disp: 90 capsule, Rfl: 1 .  tolterodine (DETROL LA) 4 MG 24 hr capsule, Take 1 capsule (4 mg total) by mouth daily., Disp: 90 capsule, Rfl: 3   Objective:   Vitals:   02/03/21 1015  BP: (!) 184/96  Pulse: 84  SpO2: 96%    Physical Exam Vitals and nursing note reviewed.  Constitutional:      Appearance: Normal appearance.  HENT:     Head: Normocephalic and atraumatic.  Cardiovascular:     Rate and Rhythm: Normal rate.     Pulses: Normal pulses.  Pulmonary:     Effort: Pulmonary effort is normal. No respiratory distress.  Abdominal:     General: Abdomen is flat. There is no distension.     Tenderness: There is no abdominal tenderness.  Skin:     Capillary Refill: Capillary refill takes less than 2 seconds.     Coloration: Skin is not jaundiced.  Neurological:     General: No focal deficit present.     Mental Status: She is alert and oriented to person, place, and time.  Psychiatric:        Mood and Affect: Mood normal.        Behavior: Behavior normal.        Thought Content: Thought content normal.     Assessment & Plan:  Morbid obesity (HCC)  Rheumatoid arthritis involving multiple sites with positive rheumatoid factor (HCC)  Chronic bilateral low back pain with bilateral sciatica  Chronic systolic heart failure (HCC)  Chronic venous insufficiency  Panniculitis  The patient is a very good candidate for a panniculectomy.  I do encourage her to have a trial of physical therapy to see if that will help with her back pain.  She is willing to do this therapy.  We will get that set up for her.  I would like to see her back after her physical therapy and after she reaches closer to her ideal weight.  The patient is in agreement to give [name] a call.  She has come a long way and I think she would do really well after the surgery.  Pictures were obtained of the patient and placed in the chart with the patient's or guardian's permission.   [place], DO

## 2021-02-06 NOTE — Progress Notes (Deleted)
Subjective:   AMRIT ERCK is a 70 y.o. female who presents for Medicare Annual (Subsequent) preventive examination.  Review of Systems    N/A        Objective:    There were no vitals filed for this visit. There is no height or weight on file to calculate BMI.  Advanced Directives 02/02/2020 11/05/2018 10/09/2018 04/10/2018 11/18/2017 11/14/2017 09/03/2017  Does Patient Have a Medical Advance Directive? Yes _0  No  Type of Paramedic of [place];Living will - - - - - -  Does patient want to make changes to medical advance directive? - - - No - Patient declined - - -  Copy of [place] in Chart? No - copy requested - - - - - -  Would patient like information on creating a medical advance directive? - No - Patient declined No - Patient declined - Yes (MAU/Ambulatory/Procedural Areas - Information given) No - Patient declined No - Patient declined    Current Medications (verified) Outpatient Encounter Medications as of 02/07/2021  Medication Sig  . clotrimazole-betamethasone (LOTRISONE) cream Apply 1 application topically 2 (two) times daily.  . DULoxetine (CYMBALTA) 60 MG capsule Take 1 capsule (60 mg total) by mouth daily.  [name] ezetimibe (ZETIA) 10 MG tablet Take 1 tablet (10 mg total) by mouth daily.  [name] lisinopril (ZESTRIL) 20 MG tablet Take 1 tablet (20 mg total) by mouth daily.  . pregabalin (LYRICA) 75 MG capsule TAKE 1 CAPSULE BY MOUTH 3 TIMES DAILY.  [name] tolterodine (DETROL LA) 4 MG 24 hr capsule Take 1 capsule (4 mg total) by mouth daily.   No facility-administered encounter medications on file as of 02/07/2021.    Allergies (verified) Latex, Doxycycline, and Sulfa antibiotics   History: Past Medical History:  Diagnosis Date  . Arthritis    Rhumetoid arthritis  . BRCA negative 07/2017   MyRisk neg  . Cancer ([place])    uterine  . Cellulitis of both lower extremities    Chronic  . CHF (congestive heart failure) ([place])   .  Coronary artery disease   . Diabetes mellitus without complication ([place])   . Family history of breast cancer 07/2017   MyRisk neg; IBIS=10.5%/riskscore=17%  . Family history of ovarian cancer   . Fibromyalgia   . Fibromyalgia affecting shoulder region   . Hyperlipidemia   . Hypertension   . Spinal stenosis    Past Surgical History:  Procedure Laterality Date  . ABLATION SAPHENOUS VEIN W/ RFA    . ANAL FISSURECTOMY  01/2004  . CORONARY ANGIOPLASTY    . CYSTOSCOPY  09/03/2017   Procedure: CYSTOSCOPY;  Surgeon: Gae Dry, MD;  Location: [facility] ORS;  Service: Gynecology;;  . DILATION AND CURETTAGE OF UTERUS    . HERNIA REPAIR    . LAPAROSCOPIC HYSTERECTOMY N/A 09/03/2017   Procedure: HYSTERECTOMY TOTAL LAPAROSCOPIC BSO;  Surgeon: Gae Dry, MD;  Location: [facility] ORS;  Service: Gynecology;  Laterality: N/A;  . MEDIAL PARTIAL KNEE REPLACEMENT Left 01/06/2014  . UMBILICAL HERNIA REPAIR N/A 09/05/2015   Procedure: HERNIA REPAIR incarcerated UMBILICAL ADULT;  Surgeon: Sherri Rad, MD;  Location: [facility] ORS;  Service: General;  Laterality: N/A;  . Vascular Stent  11/04/2013   Family History  Problem Relation Age of Onset  . Hypertension Mother   . Cataracts Mother   . Thyroid disease Mother   . Dementia Mother   . COPD Father   . Dementia Father   .  Cataracts Father   . Aneurysm Father        Abdominal & Brain  . Diabetes Father   . Melanoma Father   . Breast cancer Sister 53  . Fibromyalgia Sister   . Migraines Sister   . Hypertension Sister   . Breast cancer Sister 48  . COPD Sister   . Ovarian cancer Sister 48  . Migraines Brother   . Heart attack Brother   . Colon cancer Paternal Uncle        60s  . Colon cancer Paternal Uncle        70s  . Uterine cancer Cousin   . Uterine cancer Cousin    Social History   Socioeconomic History  . Marital status: Single    Spouse name: David  . Number of children: 1  . Years of education: 9th Grade  . Highest education  level: 9th grade  Occupational History  . Occupation: Disabled    Comment: no longer receiving  Tobacco Use  . Smoking status: Former Smoker    Packs/day: 1.00    Years: 29.00    Pack years: 29.00    Quit date: 12/03/1996    Years since quitting: 24.1  . Smokeless tobacco: Never Used  Vaping Use  . Vaping Use: Never used  Substance and Sexual Activity  . Alcohol use: No  . Drug use: No  . Sexual activity: Not Currently  Other Topics Concern  . Not on file  Social History Narrative  . Not on file   Social Determinants of Health   Financial Resource Strain: Not on file  Food Insecurity: Not on file  Transportation Needs: Not on file  Physical Activity: Not on file  Stress: Not on file  Social Connections: Not on file    Tobacco Counseling Counseling given: Not Answered   Clinical Intake:                 Diabetic? Yes  Nutrition Risk Assessment:  Has the patient had any N/V/D within the last 2 months?  No  Does the patient have any non-healing wounds?  No  Has the patient had any unintentional weight loss or weight gain?  No   Diabetes:  Is the patient diabetic?  Yes  If diabetic, was a CBG obtained today?  No  Did the patient bring in their glucometer from home?  No  How often do you monitor your CBG's? ***.   Financial Strains and Diabetes Management:  Are you having any financial strains with the device, your supplies or your medication? No .  Does the patient want to be seen by Chronic Care Management for management of their diabetes?  No  Would the patient like to be referred to a Nutritionist or for Diabetic Management?  No   Diabetic Exams:  Diabetic Eye Exam: Overdue for diabetic eye exam. Pt has been advised about the importance in completing this exam. Patient advised to call and schedule an eye exam. Diabetic Foot Exam: Overdue, Pt has been advised about the importance in completing this exam. Pt is scheduled for diabetic foot exam on  ***.          Activities of Daily Living In your present state of health, do you have any difficulty performing the following activities: 12/06/2020  Hearing? N  Vision? N  Difficulty concentrating or making decisions? N  Walking or climbing stairs? N  Dressing or bathing? N  Doing errands, shopping? N  Some recent data might   be hidden    Patient Care Team: Bacigalupo, Angela M, MD as PCP - General (Family Medicine) MacDiarmid, Scott, MD as Consulting Physician (Urology) Behalal-Bock, Christele, MD as Referring Physician (Internal Medicine)  Indicate any recent Medical Services you may have received from other than [facility] providers in the past year (date may be approximate).     Assessment:   This is a routine wellness examination for Aarionna.  Hearing/Vision screen No exam data present  Dietary issues and exercise activities discussed:    Goals    . Exercise 3x per week (30 min per time)     Recommend to start walking 3 days a week for at least 30 minutes at a time.       Depression Screen PHQ 2/9 Scores 12/26/2020 12/06/2020 11/11/2020 02/03/2020 02/02/2020 04/16/2019 11/05/2018  PHQ - 2 Score 6 1 6 1 1 2 0  PHQ- 9 Score 9 7 17 2 - 8 -  Exception Documentation - - - - - - -    Fall Risk Fall Risk  12/06/2020 02/02/2020 11/05/2018 10/09/2018 07/08/2018  Falls in the past year? 1 0 0 0 No  Number falls in past yr: 0 0 - - -  Injury with Fall? 1 0 - - -  Risk for fall due to : History of fall(s) - - - -  Follow up Falls evaluation completed - - - -    FALL RISK PREVENTION PERTAINING TO THE HOME:  Any stairs in or around the home? {YES/NO:21197} If so, are there any without handrails? {YES/NO:21197} Home free of loose throw rugs in walkways, pet beds, electrical cords, etc? Yes  Adequate lighting in your home to reduce risk of falls? Yes   ASSISTIVE DEVICES UTILIZED TO PREVENT FALLS:  Life alert? {YES/NO:21197} Use of a cane, walker or w/c? {YES/NO:21197} Grab bars in the  bathroom? {YES/NO:21197} Shower chair or bench in shower? {YES/NO:21197} Elevated toilet seat or a handicapped toilet? {YES/NO:21197}  TIMED UP AND GO:  Was the test performed? Yes .  Length of time to ambulate 10 feet: *** sec.   {Appearance of Gait:2101803}  Cognitive Function: ***     6CIT Screen 02/02/2020  What Year? 0 points  What month? 0 points  What time? 0 points  Count back from 20 0 points  Months in reverse 0 points  Repeat phrase 0 points  Total Score 0    Immunizations Immunization History  Administered Date(s) Administered  . Influenza,inj,Quad PF,6+ Mos 09/06/2015, 12/13/2016  . Pneumococcal Polysaccharide-23 12/13/2016    TDAP status: Due, Education has been provided regarding the importance of this vaccine. Advised may receive this vaccine at local pharmacy or Health Dept. Aware to provide a copy of the vaccination record if obtained from local pharmacy or Health Dept. Verbalized acceptance and understanding.  {Flu Vaccine status:2101806}  {Pneumococcal vaccine status:2101807}  {Covid-19 vaccine status:2101808}  Qualifies for Shingles Vaccine? Yes   Zostavax completed No   Shingrix Completed?: No.    Education has been provided regarding the importance of this vaccine. Patient has been advised to call insurance company to determine out of pocket expense if they have not yet received this vaccine. Advised may also receive vaccine at local pharmacy or Health Dept. Verbalized acceptance and understanding.  Screening Tests Health Maintenance  Topic Date Due  . COVID-19 Vaccine (1) Never done  . TETANUS/TDAP  Never done  . COLONOSCOPY (Pts 45-49yrs Insurance coverage will need to be confirmed)  Never done  . MAMMOGRAM  Never   done  . DEXA SCAN  Never done  . PNA vac Low Risk Adult (1 of 2 - PCV13) 09/20/2018  . OPHTHALMOLOGY EXAM  01/06/2021  . FOOT EXAM  02/02/2021  . INFLUENZA VACCINE  03/02/2021 (Originally 07/03/2020)  . HEMOGLOBIN A1C  06/20/2021   . Hepatitis C Screening  Completed  . HPV VACCINES  Aged Out    Health Maintenance  Health Maintenance Due  Topic Date Due  . COVID-19 Vaccine (1) Never done  . TETANUS/TDAP  Never done  . COLONOSCOPY (Pts 45-66yr Insurance coverage will need to be confirmed)  Never done  . MAMMOGRAM  Never done  . DEXA SCAN  Never done  . PNA vac Low Risk Adult (1 of 2 - PCV13) 09/20/2018  . OPHTHALMOLOGY EXAM  01/06/2021  . FOOT EXAM  02/02/2021    {Colorectal cancer screening:2101809}  {Mammogram status:21018020}  {Bone Density status:21018021}  Lung Cancer Screening: (Low Dose CT Chest recommended if Age 68-80years, 30 pack-year currently smoking OR have quit w/in 15years.) {DOES NOT does:27190::"does not"} qualify.   Lung Cancer Screening Referral: ***  Additional Screening:  Hepatitis C Screening: Up to date  Vision Screening: Recommended annual ophthalmology exams for early detection of glaucoma and other disorders of the eye. Is the patient up to date with their annual eye exam?  Yes  Who is the provider or what is the name of the office in which the patient attends annual eye exams? *** If pt is not established with a provider, would they like to be referred to a provider to establish care? No .   Dental Screening: Recommended annual dental exams for proper oral hygiene  Community Resource Referral / Chronic Care Management: CRR required this visit?  No   CCM required this visit?  No      Plan:     I have personally reviewed and noted the following in the patient's chart:   . Medical and social history . Use of alcohol, tobacco or illicit drugs  . Current medications and supplements . Functional ability and status . Nutritional status . Physical activity . Advanced directives . List of other physicians . Hospitalizations, surgeries, and ER visits in previous 12 months . Vitals . Screenings to include cognitive, depression, and falls . Referrals and  appointments  In addition, I have reviewed and discussed with patient certain preventive protocols, quality metrics, and best practice recommendations. A written personalized care plan for preventive services as well as general preventive health recommendations were provided to patient.     Zachrey Deutscher M[place]ton [place]  34/06/4258  Nurse Notes: ***

## 2021-02-07 ENCOUNTER — Ambulatory Visit: Payer: Self-pay

## 2021-02-09 NOTE — Progress Notes (Signed)
Subjective:   [name] [name] is a 70 y.o. female who presents for Medicare Annual (Subsequent) preventive examination.  I connected with [name] [name] today by telephone and verified that I am speaking with the correct person using two identifiers. Location patient: home Location provider: work Persons participating in the virtual visit: patient, provider.   I discussed the limitations, risks, security and privacy concerns of performing an evaluation and management service by telephone and the availability of in person appointments. I also discussed with the patient that there may be a patient responsible charge related to this service. The patient expressed understanding and verbally consented to this telephonic visit.    Interactive audio and video telecommunications were attempted between this provider and patient, however failed, due to patient having technical difficulties OR patient did not have access to video capability.  We continued and completed visit with audio only.   Review of Systems    N/A  Cardiac Risk Factors include: advanced age (>61mn, >>50women);dyslipidemia;hypertension;obesity (BMI >30kg/m2)     Objective:    Today's Vitals   02/13/21 1319  PainSc: 4    There is no height or weight on file to calculate BMI.  Advanced Directives 02/13/2021 02/02/2020 11/05/2018 10/09/2018 04/10/2018 11/18/2017 11/14/2017  Does Patient Have a Medical Advance Directive? No Yes _0   Type of Advance Directive - [place] - - - - -  Does patient want to make changes to medical advance directive? - - - - No - Patient declined - -  Copy of [place]? - No - copy requested - - - - -  Would patient like information on creating a medical advance directive? No - Patient declined - No - Patient declined No - Patient declined - Yes (MAU/Ambulatory/Procedural Areas - Information given) No - Patient declined    Current  Medications (verified) Outpatient Encounter Medications as of 02/13/2021  Medication Sig  . clotrimazole-betamethasone (LOTRISONE) cream Apply 1 application topically 2 (two) times daily.  . DULoxetine (CYMBALTA) 60 MG capsule Take 1 capsule (60 mg total) by mouth daily.  .Marland Kitchenezetimibe (ZETIA) 10 MG tablet Take 1 tablet (10 mg total) by mouth daily.  .Marland Kitchenlisinopril (ZESTRIL) 20 MG tablet Take 1 tablet (20 mg total) by mouth daily.  . pregabalin (LYRICA) 75 MG capsule TAKE 1 CAPSULE BY MOUTH 3 TIMES DAILY.  .Marland Kitchentolterodine (DETROL LA) 4 MG 24 hr capsule Take 1 capsule (4 mg total) by mouth daily.   No facility-administered encounter medications on file as of 02/13/2021.    Allergies (verified) Latex, Doxycycline, and Sulfa antibiotics   History: Past Medical History:  Diagnosis Date  . Arthritis    Rhumetoid arthritis  . BRCA negative 07/2017   MyRisk neg  . Cancer ([place]    uterine  . Cellulitis of both lower extremities    Chronic  . CHF (congestive heart failure) ([place]   . Coronary artery disease   . Diabetes mellitus without complication ([place]   . Family history of breast cancer 07/2017   MyRisk neg; IBIS=10.5%/riskscore=17%  . Family history of ovarian cancer   . Fibromyalgia   . Fibromyalgia affecting shoulder region   . Hyperlipidemia   . Hypertension   . Spinal stenosis    Past Surgical History:  Procedure Laterality Date  . ABLATION SAPHENOUS VEIN W/ RFA    . ANAL FISSURECTOMY  01/2004  . CORONARY ANGIOPLASTY    . CYSTOSCOPY  09/03/2017  Procedure: CYSTOSCOPY;  Surgeon: Gae Dry, MD;  Location: [facility] ORS;  Service: Gynecology;;  . DILATION AND CURETTAGE OF UTERUS    . HERNIA REPAIR    . LAPAROSCOPIC HYSTERECTOMY N/A 09/03/2017   Procedure: HYSTERECTOMY TOTAL LAPAROSCOPIC BSO;  Surgeon: Gae Dry, MD;  Location: [facility] ORS;  Service: Gynecology;  Laterality: N/A;  . MEDIAL PARTIAL KNEE REPLACEMENT Left 01/06/2014  . UMBILICAL HERNIA REPAIR N/A 09/05/2015    Procedure: HERNIA REPAIR incarcerated UMBILICAL ADULT;  Surgeon: Sherri Rad, MD;  Location: [facility] ORS;  Service: General;  Laterality: N/A;  . Vascular Stent  11/04/2013   Family History  Problem Relation Age of Onset  . Hypertension Mother   . Cataracts Mother   . Thyroid disease Mother   . Dementia Mother   . COPD Father   . Dementia Father   . Cataracts Father   . Aneurysm Father        Abdominal & Brain  . Diabetes Father   . Melanoma Father   . Breast cancer Sister 65  . Fibromyalgia Sister   . Migraines Sister   . Hypertension Sister   . Breast cancer Sister 53  . COPD Sister   . Ovarian cancer Sister 66  . Migraines Brother   . Heart attack Brother   . Colon cancer Paternal Uncle        29s  . Colon cancer Paternal Uncle        47s  . Uterine cancer Cousin   . Uterine cancer Cousin    Social History   Socioeconomic History  . Marital status: Widowed    Spouse name: Shanon Brow  . Number of children: 1  . Years of education: 9th Grade  . Highest education level: 9th grade  Occupational History  . Occupation: Disabled    Comment: no longer receiving  . Occupation: on [employer]  Tobacco Use  . Smoking status: Former Smoker    Packs/day: 1.00    Years: 29.00    Pack years: 29.00    Quit date: 12/03/1996    Years since quitting: 24.2  . Smokeless tobacco: Never Used  Vaping Use  . Vaping Use: Never used  Substance and Sexual Activity  . Alcohol use: No  . Drug use: No  . Sexual activity: Not Currently  Other Topics Concern  . Not on file  Social History Narrative  . Not on file   Social Determinants of Health   Financial Resource Strain: Low Risk   . Difficulty of Paying Living Expenses: Not hard at all  Food Insecurity: No Food Insecurity  . Worried About Charity fundraiser in the Last Year: Never true  . Ran Out of Food in the Last Year: Never true  Transportation Needs: No Transportation Needs  . Lack of Transportation (Medical): No  . Lack of  Transportation (Non-Medical): No  Physical Activity: Sufficiently Active  . Days of Exercise per Week: 7 days  . Minutes of Exercise per Session: 30 min  Stress: Stress Concern Present  . Feeling of Stress : Rather much  Social Connections: Socially Isolated  . Frequency of Communication with Friends and Family: More than three times a week  . Frequency of Social Gatherings with Friends and Family: More than three times a week  . Attends Religious Services: Never  . Active Member of Clubs or Organizations: No  . Attends Archivist Meetings: Never  . Marital Status: Widowed    Tobacco Counseling Counseling given: Not Answered  Clinical Intake:  Pre-visit preparation completed: Yes  Pain : 0-10 Pain Score: 4  Pain Type: Chronic pain Pain Location: Shoulder Pain Orientation: Left Pain Descriptors / Indicators: Aching Pain Frequency: Intermittent Pain Relieving Factors: Takes Tylenol as needed for pain.  Pain Relieving Factors: Takes Tylenol as needed for pain.  Nutritional Risks: None Diabetes: Yes  How often do you need to have someone help you when you read instructions, pamphlets, or other written materials from your doctor or pharmacy?: 1 - Never  Diabetic? Yes, managed.  Nutrition Risk Assessment:  Has the patient had any N/V/D within the last 2 months?  No  Does the patient have any non-healing wounds?  No  Has the patient had any unintentional weight loss or weight gain?  No   Diabetes:  Is the patient diabetic?  Yes  If diabetic, was a CBG obtained today?  No  Did the patient bring in their glucometer from home?  No  How often do you monitor your CBG's? Once a day.   Financial Strains and Diabetes Management:  Are you having any financial strains with the device, your supplies or your medication? No .  Does the patient want to be seen by Chronic Care Management for management of their diabetes?  No  Would the patient like to be referred to a  Nutritionist or for Diabetic Management?  No   Diabetic Exams:  Diabetic Eye Exam: Overdue for diabetic eye exam. Pt has been advised about the importance in completing this exam. Patient advised to call and schedule an eye exam. Diabetic Foot Exam: Overdue, Pt has been advised about the importance in completing this exam.    Interpreter Needed?: No  Information entered by :: [facility], LPN   Activities of Daily Living In your present state of health, do you have any difficulty performing the following activities: 02/13/2021 12/06/2020  Hearing? N N  Vision? N N  Difficulty concentrating or making decisions? N N  Walking or climbing stairs? N N  Dressing or bathing? N N  Doing errands, shopping? N N  Preparing Food and eating ? N -  Using the Toilet? N -  In the past six months, have you accidently leaked urine? Y -  Comment Occasionally, on Detrol for leakage. -  Do you have problems with loss of bowel control? N -  Managing your Medications? N -  Managing your Finances? N -  Housekeeping or managing your Housekeeping? N -  Some recent data might be hidden    Patient Care Team: Virginia Crews, MD as PCP - General (Family Medicine) Bjorn Loser, MD as Consulting Physician (Urology) Albertina Parr, [place] (Optometry) Dillingham, Loel Lofty, DO as Attending Physician (Plastic Surgery)  Indicate any recent Medical Services you may have received from other than [facility] providers in the past year (date may be approximate).     Assessment:   This is a routine wellness examination for [place].  Hearing/Vision screen No exam data present  Dietary issues and exercise activities discussed: Current Exercise Habits: Home exercise routine, Type of exercise: treadmill;walking, Time (Minutes): 30, Frequency (Times/Week): 7, Weekly Exercise (Minutes/Week): 210, Intensity: Mild, Exercise limited by: None identified  Goals    . Prevent falls     Recommend to remove any items from the  home that may cause slips or trips.      Depression Screen PHQ 2/9 Scores 02/13/2021 12/26/2020 12/06/2020 11/11/2020 02/03/2020 02/02/2020 04/16/2019  PHQ - 2 Score _0 2  PHQ- 9 Score _0 - 8  Exception Documentation - - - - - - -    Fall Risk Fall Risk  02/13/2021 12/06/2020 02/02/2020 11/05/2018 10/09/2018  Falls in the past year? 1 1 0 0 0  Number falls in past yr: 0 0 0 - -  Injury with Fall? 0 1 0 - -  Risk for fall due to : - History of fall(s) - - -  Follow up Falls prevention discussed Falls evaluation completed - - -    FALL RISK PREVENTION PERTAINING TO THE HOME:  Any stairs in or around the home? Yes  If so, are there any without handrails? No  Home free of loose throw rugs in walkways, pet beds, electrical cords, etc? Yes  Adequate lighting in your home to reduce risk of falls? Yes   ASSISTIVE DEVICES UTILIZED TO PREVENT FALLS:  Life alert? No  Use of a cane, walker or w/c? No  Grab bars in the bathroom? Yes  Shower chair or bench in shower? No  Elevated toilet seat or a handicapped toilet? Yes    Cognitive Function: Normal cognitive status assessed by observation by this Nurse Health Advisor. No abnormalities found.       6CIT Screen 02/02/2020  What Year? 0 points  What month? 0 points  What time? 0 points  Count back from 20 0 points  Months in reverse 0 points  Repeat phrase 0 points  Total Score 0    Immunizations Immunization History  Administered Date(s) Administered  . Influenza,inj,Quad PF,6+ Mos 09/06/2015, 12/13/2016  . Pneumococcal Polysaccharide-23 12/13/2016    TDAP status: Due, Education has been provided regarding the importance of this vaccine. Advised may receive this vaccine at local pharmacy or Health Dept. Aware to provide a copy of the vaccination record if obtained from local pharmacy or Health Dept. Verbalized acceptance and understanding.  Flu Vaccine status: Declined, Education has been provided regarding the importance of  this vaccine but patient still declined. Advised may receive this vaccine at local pharmacy or Health Dept. Aware to provide a copy of the vaccination record if obtained from local pharmacy or Health Dept. Verbalized acceptance and understanding.  Pneumococcal vaccine status: Declined,  Education has been provided regarding the importance of this vaccine but patient still declined. Advised may receive this vaccine at local pharmacy or Health Dept. Aware to provide a copy of the vaccination record if obtained from local pharmacy or Health Dept. Verbalized acceptance and understanding.   Covid-19 vaccine status: Declined, Education has been provided regarding the importance of this vaccine but patient still declined. Advised may receive this vaccine at local pharmacy or Health Dept.or vaccine clinic. Aware to provide a copy of the vaccination record if obtained from local pharmacy or Health Dept. Verbalized acceptance and understanding.  Qualifies for Shingles Vaccine? Yes   Zostavax completed No   Shingrix Completed?: No.    Education has been provided regarding the importance of this vaccine. Patient has been advised to call insurance company to determine out of pocket expense if they have not yet received this vaccine. Advised may also receive vaccine at local pharmacy or Health Dept. Verbalized acceptance and understanding.  Screening Tests Health Maintenance  Topic Date Due  . COLONOSCOPY (Pts 45-13yr Insurance coverage will need to be confirmed)  Never done  . MAMMOGRAM  Never done  . DEXA SCAN  Never done  . FOOT EXAM  02/02/2021  . COVID-19 Vaccine (1) 03/01/2021 (Originally 09/20/1956)  .  INFLUENZA VACCINE  03/02/2021 (Originally 07/03/2020)  . TETANUS/TDAP  02/13/2022 (Originally 09/20/1970)  . PNA vac Low Risk Adult (1 of 2 - PCV13) 02/13/2022 (Originally 09/20/2018)  . HEMOGLOBIN A1C  06/20/2021  . OPHTHALMOLOGY EXAM  01/24/2022  . Hepatitis C Screening  Completed  . HPV VACCINES   Aged Out    Health Maintenance  Health Maintenance Due  Topic Date Due  . COLONOSCOPY (Pts 45-68yr Insurance coverage will need to be confirmed)  Never done  . MAMMOGRAM  Never done  . DEXA SCAN  Never done  . FOOT EXAM  02/02/2021    Colorectal cancer screening: Type of screening: Cologuard. Ordered today.  Mammogram status: Currently due, declined order today.  Bone Density status: Ordered today. Pt provided with contact info and advised to call to schedule appt.  Lung Cancer Screening: (Low Dose CT Chest recommended if Age 68-80years, 30 pack-year currently smoking OR have quit w/in 15years.) does not qualify.   Additional Screening:  Hepatitis C Screening: Up to date  Vision Screening: Recommended annual ophthalmology exams for early detection of glaucoma and other disorders of the eye. Is the patient up to date with their annual eye exam?  Yes  Who is the provider or what is the name of the office in which the patient attends annual eye exams? Dr MWaldemar DickensIf pt is not established with a provider, would they like to be referred to a provider to establish care? No .   Dental Screening: Recommended annual dental exams for proper oral hygiene  Community Resource Referral / Chronic Care Management: CRR required this visit?  No   CCM required this visit?  No      Plan:     I have personally reviewed and noted the following in the patient's chart:   . Medical and social history . Use of alcohol, tobacco or illicit drugs  . Current medications and supplements . Functional ability and status . Nutritional status . Physical activity . Advanced directives . List of other physicians . Hospitalizations, surgeries, and ER visits in previous 12 months . Vitals . Screenings to include cognitive, depression, and falls . Referrals and appointments  In addition, I have reviewed and discussed with patient certain preventive protocols, quality metrics, and best practice  recommendations. A written personalized care plan for preventive services as well as general preventive health recommendations were provided to patient.     McKenzie [place]ockdale [place]  [id number]/95/[id number]  Nurse Notes: Pt needs a diabetic foot exam at next in office apt. Pt declined receiving any future vaccines. Cologuard ordered today.

## 2021-02-13 ENCOUNTER — Ambulatory Visit (INDEPENDENT_AMBULATORY_CARE_PROVIDER_SITE_OTHER): Payer: Medicare Other

## 2021-02-13 ENCOUNTER — Other Ambulatory Visit: Payer: Self-pay

## 2021-02-13 DIAGNOSIS — Z1211 Encounter for screening for malignant neoplasm of colon: Secondary | ICD-10-CM | POA: Diagnosis not present

## 2021-02-13 DIAGNOSIS — Z1231 Encounter for screening mammogram for malignant neoplasm of breast: Secondary | ICD-10-CM | POA: Diagnosis not present

## 2021-02-13 DIAGNOSIS — Z0000 Encounter for general adult medical examination without abnormal findings: Secondary | ICD-10-CM

## 2021-02-13 DIAGNOSIS — E2839 Other primary ovarian failure: Secondary | ICD-10-CM

## 2021-02-13 NOTE — Patient Instructions (Signed)
[name] [name] , Thank you for taking time to come for your Medicare Wellness Visit. I appreciate your ongoing commitment to your health goals. Please review the following plan we discussed and let me know if I can assist you in the future.   Screening recommendations/referrals: Colonoscopy: Currently due, cologuard kit ordered today. Mammogram: Ordered today. Pt aware office will contact her WF:UXNA. Bone Density: Ordered today. Pt aware office will contact her TF:TDDU. Recommended yearly ophthalmology/optometry visit for glaucoma screening and checkup Recommended yearly dental visit for hygiene and checkup  Vaccinations: Influenza vaccine: Currently due, declined receiving.  Pneumococcal vaccine: Currently due, declined receiving.  Tdap vaccine: Currently due, declined receiving. Shingles vaccine: Shingrix discussed. Please contact your pharmacy for coverage information.     Advanced directives: Advance directive discussed with you today. Even though you declined this today please call our office should you change your mind and we can give you the proper paperwork for you to fill out.  Conditions/risks identified: Fall risk preventatives discussed today.  Next appointment: 03/07/21 @ 2:40 PM with Dr Brita Romp    Preventive Care 68 Years and Older, Female Preventive care refers to lifestyle choices and visits with your health care provider that can promote health and wellness. What does preventive care include?  A yearly physical exam. This is also called an annual well check.  Dental exams once or twice a year.  Routine eye exams. Ask your health care provider how often you should have your eyes checked.  Personal lifestyle choices, including:  Daily care of your teeth and gums.  Regular physical activity.  Eating a healthy diet.  Avoiding tobacco and drug use.  Limiting alcohol use.  Practicing safe sex.  Taking low-dose aspirin every day.  Taking vitamin and mineral  supplements as recommended by your health care provider. What happens during an annual well check? The services and screenings done by your health care provider during your annual well check will depend on your age, overall health, lifestyle risk factors, and family history of disease. Counseling  Your health care provider may ask you questions about your:  Alcohol use.  Tobacco use.  Drug use.  Emotional well-being.  Home and relationship well-being.  Sexual activity.  Eating habits.  History of falls.  Memory and ability to understand (cognition).  Work and work Statistician.  Reproductive health. Screening  You may have the following tests or measurements:  Height, weight, and BMI.  Blood pressure.  Lipid and cholesterol levels. These may be checked every 5 years, or more frequently if you are over 35 years old.  Skin check.  Lung cancer screening. You may have this screening every year starting at age 18 if you have a 30-pack-year history of smoking and currently smoke or have quit within the past 15 years.  Fecal occult blood test (FOBT) of the stool. You may have this test every year starting at age 17.  Flexible sigmoidoscopy or colonoscopy. You may have a sigmoidoscopy every 5 years or a colonoscopy every 10 years starting at age 39.  Hepatitis C blood test.  Hepatitis B blood test.  Sexually transmitted disease (STD) testing.  Diabetes screening. This is done by checking your blood sugar (glucose) after you have not eaten for a while (fasting). You may have this done every 1-3 years.  Bone density scan. This is done to screen for osteoporosis. You may have this done starting at age 41.  Mammogram. This may be done every 1-2 years. Talk to your health  care provider about how often you should have regular mammograms. Talk with your health care provider about your test results, treatment options, and if necessary, the need for more tests. Vaccines  Your  health care provider may recommend certain vaccines, such as:  Influenza vaccine. This is recommended every year.  Tetanus, diphtheria, and acellular pertussis (Tdap, Td) vaccine. You may need a Td booster every 10 years.  Zoster vaccine. You may need this after age 5.  Pneumococcal 13-valent conjugate (PCV13) vaccine. One dose is recommended after age 45.  Pneumococcal polysaccharide (PPSV23) vaccine. One dose is recommended after age 51. Talk to your health care provider about which screenings and vaccines you need and how often you need them. This information is not intended to replace advice given to you by your health care provider. Make sure you discuss any questions you have with your health care provider. Document Released: 12/16/2015 Document Revised: 08/08/2016 Document Reviewed: 09/20/2015 Elsevier Interactive Patient Education  2017 [place] Prevention in the Home Falls can cause injuries. They can happen to people of all ages. There are many things you can do to make your home safe and to help prevent falls. What can I do on the outside of my home?  Regularly fix the edges of walkways and driveways and fix any cracks.  Remove anything that might make you trip as you walk through a door, such as a raised step or threshold.  Trim any bushes or trees on the path to your home.  Use bright outdoor lighting.  Clear any walking paths of anything that might make someone trip, such as rocks or tools.  Regularly check to see if handrails are loose or broken. Make sure that both sides of any steps have handrails.  Any raised decks and porches should have guardrails on the edges.  Have any leaves, snow, or ice cleared regularly.  Use sand or salt on walking paths during winter.  Clean up any spills in your garage right away. This includes oil or grease spills. What can I do in the bathroom?  Use night lights.  Install grab bars by the toilet and in the tub and  shower. Do not use towel bars as grab bars.  Use non-skid mats or decals in the tub or shower.  If you need to sit down in the shower, use a plastic, non-slip stool.  Keep the floor dry. Clean up any water that spills on the floor as soon as it happens.  Remove soap buildup in the tub or shower regularly.  Attach bath mats securely with double-sided non-slip rug tape.  Do not have throw rugs and other things on the floor that can make you trip. What can I do in the bedroom?  Use night lights.  Make sure that you have a light by your bed that is easy to reach.  Do not use any sheets or blankets that are too big for your bed. They should not hang down onto the floor.  Have a firm chair that has side arms. You can use this for support while you get dressed.  Do not have throw rugs and other things on the floor that can make you trip. What can I do in the kitchen?  Clean up any spills right away.  Avoid walking on wet floors.  Keep items that you use a lot in easy-to-reach places.  If you need to reach something above you, use a strong step stool that has a grab  bar.  Keep electrical cords out of the way.  Do not use floor polish or wax that makes floors slippery. If you must use wax, use non-skid floor wax.  Do not have throw rugs and other things on the floor that can make you trip. What can I do with my stairs?  Do not leave any items on the stairs.  Make sure that there are handrails on both sides of the stairs and use them. Fix handrails that are broken or loose. Make sure that handrails are as long as the stairways.  Check any carpeting to make sure that it is firmly attached to the stairs. Fix any carpet that is loose or worn.  Avoid having throw rugs at the top or bottom of the stairs. If you do have throw rugs, attach them to the floor with carpet tape.  Make sure that you have a light switch at the top of the stairs and the bottom of the stairs. If you do not  have them, ask someone to add them for you. What else can I do to help prevent falls?  Wear shoes that:  Do not have high heels.  Have rubber bottoms.  Are comfortable and fit you well.  Are closed at the toe. Do not wear sandals.  If you use a stepladder:  Make sure that it is fully opened. Do not climb a closed stepladder.  Make sure that both sides of the stepladder are locked into place.  Ask someone to hold it for you, if possible.  Clearly mark and make sure that you can see:  Any grab bars or handrails.  First and last steps.  Where the edge of each step is.  Use tools that help you move around (mobility aids) if they are needed. These include:  Canes.  Walkers.  Scooters.  Crutches.  Turn on the lights when you go into a dark area. Replace any light bulbs as soon as they burn out.  Set up your furniture so you have a clear path. Avoid moving your furniture around.  If any of your floors are uneven, fix them.  If there are any pets around you, be aware of where they are.  Review your medicines with your doctor. Some medicines can make you feel dizzy. This can increase your chance of falling. Ask your doctor what other things that you can do to help prevent falls. This information is not intended to replace advice given to you by your health care provider. Make sure you discuss any questions you have with your health care provider. Document Released: 09/15/2009 Document Revised: 04/26/2016 Document Reviewed: 12/24/2014 Elsevier Interactive Patient Education  2017 [employer].

## 2021-02-20 ENCOUNTER — Ambulatory Visit: Payer: Self-pay | Admitting: Urology

## 2021-02-21 ENCOUNTER — Encounter: Payer: Self-pay | Admitting: Urology

## 2021-03-06 ENCOUNTER — Ambulatory Visit (INDEPENDENT_AMBULATORY_CARE_PROVIDER_SITE_OTHER): Payer: Medicare Other | Admitting: Urology

## 2021-03-06 ENCOUNTER — Encounter: Payer: Self-pay | Admitting: Urology

## 2021-03-06 ENCOUNTER — Other Ambulatory Visit: Payer: Self-pay

## 2021-03-06 VITALS — BP 198/82 | HR 82 | Ht 68.0 in | Wt 228.0 lb

## 2021-03-06 DIAGNOSIS — N3946 Mixed incontinence: Secondary | ICD-10-CM

## 2021-03-06 MED ORDER — TOLTERODINE TARTRATE ER 4 MG PO CP24
4.0000 mg | ORAL_CAPSULE | Freq: Every day | ORAL | 3 refills | Status: DC
Start: 2021-03-06 — End: 2022-03-06

## 2021-03-06 MED ORDER — VIBEGRON 75 MG PO TABS: 75.0000 mg | ORAL_TABLET | Freq: Every day | ORAL | 3 refills | Status: DC

## 2021-03-06 NOTE — Progress Notes (Signed)
03/06/2021 8:40 AM   [name] [name] 1950/12/09 [id number]  Referring provider: Virginia Crews, [address] [place],  [place] [address]  Chief Complaint  Patient presents with  . Urinary Incontinence    HPI: 2019:Patient was consulted to be assessed for worsening urinary incontinence since a hysterectomy in October 2018. She leaks with coughing sneezing bending lifting a significant amount. She wears 10-15 pads a day that are soaked. She has urge incontinence. She can run water and leak without awareness. She will leak when she goes from a sitting to standing position. She has foot on the floor syndrome. She has mild bedwetting but not every night  She voids every 30 to 45 minutes and cannot hold it for 2 hours. She gets up 5-6 times a night. She has left ankle edema.  The pelvic examination was a little bit limited by her obesity. She had grade 2 hypermobility the bladder neck and a negative cough test but her bladder I believe is quite empty. She had a large suprapubic fat pad  Patient has high-volume mixed incontinence with typical triggers. She has intermittent milder bedwetting. She has severe frequency. She has significant nocturia. If the patient ever needed a sling she would need a mini sling and not a retropubic  On urodynamics she did not void but her bladder was empty. Her maximum bladder capacity was 100 mL. She had sensory urgency. The maximum detrusor pressure was 38 cm water occurring at 62 mL. She had to void off the contraction. She had no leakage associatedwith coughing reaching a pressure of 148 cm of water. She was triggering instability. The instability was provoked and unprovoked throughout. During voluntary voiding she voided 76 mils of maximum voiding pressure 21 cm of water. She emptied efficiently. Bladder neck descent at 1 cm.   At least 90% of the patient's problem is not overactive bladder. If she fails  medical and behavioral therapy I would be offering her a refractory overactive bladder therapy. If she truly does have stress incontinence this could persist.  Based upon body habitus Botox and InterStim would not be ideal  Myrbetriq helped minimally.She now only wears about 3 pads a day and only getting up twice at night  She is concerned about percutaneous tibial nerve stimulation because of neuropathy and a skin condition I believe.  Less frequency during the day. Gets up twice instead of four times. Three pads per day incontinence stable.  Today Detrol works better than the oxybutynin gave side effect. Still using 3 pads a day. Getting up 2-4 times a day. Overall much better. I think we have reached the end of the algorithm. She thought about percutaneous tibial nerve stimulation but is chosen not to.  Reassess in the year on Detrol  Today Urge incontinence stable.  Frequency stable.  Clinically not infected.  She wanted speak about percutaneous tibial nerve stimulation in detail.  I gave her the handout  Today Patient still has urge incontinence wearing 3 pads a day.  Detrol helps some.  Clinically not infected.  Frequency stable.  She started to do to do percutaneous tibial nerve stimulation treatments but was having trouble with neuropathy and some leg ulcers and stopped.  She is lost 160 pounds.  She is going to have plastic surgery.   PMH: Past Medical History:  Diagnosis Date  . Arthritis    Rhumetoid arthritis  . BRCA negative 07/2017   MyRisk neg  . Cancer ([place])  uterine  . Cellulitis of both lower extremities    Chronic  . CHF (congestive heart failure) ([place])   . Coronary artery disease   . Diabetes mellitus without complication ([place])   . Family history of breast cancer 07/2017   MyRisk neg; IBIS=10.5%/riskscore=17%  . Family history of ovarian cancer   . Fibromyalgia   . Fibromyalgia affecting shoulder region   . Hyperlipidemia   . Hypertension    . Spinal stenosis     Surgical History: Past Surgical History:  Procedure Laterality Date  . ABLATION SAPHENOUS VEIN W/ RFA    . ANAL FISSURECTOMY  01/2004  . CORONARY ANGIOPLASTY    . CYSTOSCOPY  09/03/2017   Procedure: CYSTOSCOPY;  Surgeon: Gae Dry, MD;  Location: [facility] ORS;  Service: Gynecology;;  . DILATION AND CURETTAGE OF UTERUS    . HERNIA REPAIR    . LAPAROSCOPIC HYSTERECTOMY N/A 09/03/2017   Procedure: HYSTERECTOMY TOTAL LAPAROSCOPIC BSO;  Surgeon: Gae Dry, MD;  Location: [facility] ORS;  Service: Gynecology;  Laterality: N/A;  . MEDIAL PARTIAL KNEE REPLACEMENT Left 01/06/2014  . UMBILICAL HERNIA REPAIR N/A 09/05/2015   Procedure: HERNIA REPAIR incarcerated UMBILICAL ADULT;  Surgeon: Sherri Rad, MD;  Location: [facility] ORS;  Service: General;  Laterality: N/A;  . Vascular Stent  11/04/2013    Home Medications:  Allergies as of 03/06/2021      Reactions   Latex Itching   itching   Doxycycline Rash   Sulfa Antibiotics Itching, Rash, Swelling      Medication List       Accurate as of March 06, 2021  8:40 AM. If you have any questions, ask your nurse or doctor.        clotrimazole-betamethasone cream Commonly known as: LOTRISONE Apply 1 application topically 2 (two) times daily.   DULoxetine 60 MG capsule Commonly known as: CYMBALTA Take 1 capsule (60 mg total) by mouth daily.   ezetimibe 10 MG tablet Commonly known as: Zetia Take 1 tablet (10 mg total) by mouth daily.   lisinopril 20 MG tablet Commonly known as: ZESTRIL Take 1 tablet (20 mg total) by mouth daily.   pregabalin 75 MG capsule Commonly known as: LYRICA TAKE 1 CAPSULE BY MOUTH 3 TIMES DAILY.   tolterodine 4 MG 24 hr capsule Commonly known as: Detrol LA Take 1 capsule (4 mg total) by mouth daily.       Allergies:  Allergies  Allergen Reactions  . Latex Itching    itching  . Doxycycline Rash  . Sulfa Antibiotics Itching, Rash and Swelling    Family History: Family History   Problem Relation Age of Onset  . Hypertension Mother   . Cataracts Mother   . Thyroid disease Mother   . Dementia Mother   . COPD Father   . Dementia Father   . Cataracts Father   . Aneurysm Father        Abdominal & Brain  . Diabetes Father   . Melanoma Father   . Breast cancer Sister 75  . Fibromyalgia Sister   . Migraines Sister   . Hypertension Sister   . Breast cancer Sister 3  . COPD Sister   . Ovarian cancer Sister 54  . Migraines Brother   . Heart attack Brother   . Colon cancer Paternal Uncle        16s  . Colon cancer Paternal Uncle        47s  . Uterine cancer Cousin   . Uterine cancer  Cousin     Social History:  reports that she quit smoking about 24 years ago. She has a 29.00 pack-year smoking history. She has never used smokeless tobacco. She reports that she does not drink alcohol and does not use drugs.  ROS:                                        Physical Exam: BP (!) 198/82   Pulse 82   Ht '5\' 8"'  (1.727 m)   Wt 103.4 kg   LMP  (LMP Unknown) Comment: age 30  BMI 34.67 kg/m   Constitutional:  Alert and oriented, No acute distress.   Laboratory Data: Lab Results  Component Value Date   WBC 5.4 12/21/2020   HGB 10.3 (L) 12/21/2020   HCT 33.2 (L) 12/21/2020   MCV 79 12/21/2020   PLT 323 12/21/2020    Lab Results  Component Value Date   CREATININE 0.95 12/21/2020    No results found for: PSA  No results found for: TESTOSTERONE  Lab Results  Component Value Date   HGBA1C 7.1 (H) 12/21/2020    Urinalysis    Component Value Date/Time   COLORURINE YELLOW (A) 09/04/2015 2209   APPEARANCEUR Cloudy (A) 09/28/2019 1530   LABSPEC 1.016 09/04/2015 2209   PHURINE 7.0 09/04/2015 2209   GLUCOSEU Trace (A) 09/28/2019 1530   HGBUR NEGATIVE 09/04/2015 2209   BILIRUBINUR Negative 09/28/2019 1530   KETONESUR 1+ (A) 09/04/2015 2209   PROTEINUR 2+ (A) 09/28/2019 1530   PROTEINUR NEGATIVE 09/04/2015 2209   NITRITE  Negative 09/28/2019 1530   NITRITE NEGATIVE 09/04/2015 2209   LEUKOCYTESUR Negative 09/28/2019 1530    Pertinent Imaging:   Assessment & Plan: I sent 90x3 Detrol.  I gave her the new beta 3 agonists with samples and prescription.  She will stay on combination if it works well.    There are no diagnoses linked to this encounter.  No follow-ups on file.  Reece Packer, MD  [facility] [address] [place], [place] [address] [phone]

## 2021-03-07 ENCOUNTER — Encounter: Payer: Self-pay | Admitting: Family Medicine

## 2021-03-07 ENCOUNTER — Ambulatory Visit (INDEPENDENT_AMBULATORY_CARE_PROVIDER_SITE_OTHER): Payer: Medicare Other | Admitting: Family Medicine

## 2021-03-07 VITALS — BP 153/82 | HR 88 | Temp 98.6°F | Wt 227.7 lb

## 2021-03-07 DIAGNOSIS — E1159 Type 2 diabetes mellitus with other circulatory complications: Secondary | ICD-10-CM

## 2021-03-07 DIAGNOSIS — Z599 Problem related to housing and economic circumstances, unspecified: Secondary | ICD-10-CM | POA: Diagnosis not present

## 2021-03-07 DIAGNOSIS — E669 Obesity, unspecified: Secondary | ICD-10-CM | POA: Diagnosis not present

## 2021-03-07 DIAGNOSIS — F419 Anxiety disorder, unspecified: Secondary | ICD-10-CM | POA: Insufficient documentation

## 2021-03-07 DIAGNOSIS — F331 Major depressive disorder, recurrent, moderate: Secondary | ICD-10-CM

## 2021-03-07 DIAGNOSIS — I152 Hypertension secondary to endocrine disorders: Secondary | ICD-10-CM

## 2021-03-07 DIAGNOSIS — D692 Other nonthrombocytopenic purpura: Secondary | ICD-10-CM

## 2021-03-07 DIAGNOSIS — Z59819 Housing instability, housed unspecified: Secondary | ICD-10-CM

## 2021-03-07 DIAGNOSIS — Z6834 Body mass index (BMI) 34.0-34.9, adult: Secondary | ICD-10-CM

## 2021-03-07 MED ORDER — CLONAZEPAM 0.5 MG PO TABS
0.2500 mg | ORAL_TABLET | Freq: Two times a day (BID) | ORAL | 0 refills | Status: DC | PRN
Start: 2021-03-07 — End: 2022-03-06

## 2021-03-07 MED ORDER — CARVEDILOL 6.25 MG PO TABS: 6.2500 mg | ORAL_TABLET | Freq: Two times a day (BID) | ORAL | 3 refills | Status: DC
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
## 2021-03-08 ENCOUNTER — Telehealth: Payer: Self-pay | Admitting: *Deleted

## 2021-03-08 ENCOUNTER — Other Ambulatory Visit: Payer: Medicare Other

## 2021-03-08 NOTE — Chronic Care Management (AMB) (Signed)
  Chronic Care Management   Note  03/08/2021 Name: [name] [name] MRN: [id number] DOB: [birth year]  [name] [name] is a 70 y.o. year old female who is a primary care patient of Brita Romp, Dionne Bucy, MD. I reached out to [name] by phone today in response to a referral sent by [name] PCP, Dr. Brita Romp.      [name] was given information about Chronic Care Management services today including:  1. CCM service includes personalized support from designated clinical staff supervised by her physician, including individualized plan of care and coordination with other care providers 2. 24/7 contact phone numbers for assistance for urgent and routine care needs. 3. Service will only be billed when office clinical staff spend 20 minutes or more in a month to coordinate care. 4. Only one practitioner may furnish and bill the service in a calendar month. 5. The patient may stop CCM services at any time (effective at the end of the month) by phone call to the office staff. 6. The patient will be responsible for cost sharing (co-pay) of up to 20% of the service fee (after annual deductible is met).  Patient agreed to services and verbal consent obtained.   Follow up plan: Telephone appointment with care management team member scheduled for:03/13/2021  [facility]

## 2021-03-09 ENCOUNTER — Telehealth: Payer: Self-pay

## 2021-03-09 NOTE — Telephone Encounter (Signed)
Copied from [place] [id number]. Topic: General - Inquiry >> Mar 09, 2021 12:33 PM Greggory Keen D wrote: Pt called saying her heart medication was changed recently to [facility] and this is making her dizzy and sleepy.[name]  CB#  [phone]

## 2021-03-13 ENCOUNTER — Telehealth: Payer: Medicare Other | Admitting: *Deleted

## 2021-03-13 ENCOUNTER — Telehealth: Payer: Self-pay | Admitting: *Deleted

## 2021-03-13 NOTE — Telephone Encounter (Signed)
  Chronic Care Management   Outreach Note  03/13/2021 Name: MADDI [name] MRN: [id number] DOB: 1950/12/09  Referred by: Virginia Crews, MD Reason for referral : Chronic Care Management   An unsuccessful telephone outreach was attempted today. The patient was referred to the case management team for assistance with care management and care coordination.   Follow Up Plan: Telephone follow up appointment with care management team to be re-scheduled by careguide   Elliot Gurney, [place] Worker  [facility] Care Management [phone]

## 2021-03-16 DIAGNOSIS — H02831 Dermatochalasis of right upper eyelid: Secondary | ICD-10-CM | POA: Diagnosis not present

## 2021-03-17 ENCOUNTER — Other Ambulatory Visit: Payer: Self-pay | Admitting: Family Medicine

## 2021-03-17 NOTE — Telephone Encounter (Signed)
Patient reports she is not taking Lisinopril.
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
## 2021-03-21 ENCOUNTER — Ambulatory Visit: Payer: Medicare Other | Admitting: *Deleted

## 2021-03-21 NOTE — Chronic Care Management (AMB) (Signed)
   03/21/2021  LUNAH LOSASSO Jun 18, 1951 [id number]  Patient contacted to address housing needs, however upon further conversation housing barriers have now been resolved. Patient's enrollment status has been changed to "patient not interested-declined services".   Patient encouraged to call this social worker with any additional community resource needs.    Elliot Gurney, [place] Worker  [facility] Care Management [phone]

## 2021-03-22 ENCOUNTER — Telehealth: Payer: Medicare Other

## 2021-03-23 DIAGNOSIS — H02135 Senile ectropion of left lower eyelid: Secondary | ICD-10-CM | POA: Insufficient documentation

## 2021-03-23 DIAGNOSIS — H02403 Unspecified ptosis of bilateral eyelids: Secondary | ICD-10-CM | POA: Insufficient documentation

## 2021-03-23 DIAGNOSIS — H57813 Brow ptosis, bilateral: Secondary | ICD-10-CM | POA: Insufficient documentation

## 2021-03-23 DIAGNOSIS — H02831 Dermatochalasis of right upper eyelid: Secondary | ICD-10-CM | POA: Insufficient documentation

## 2021-03-23 DIAGNOSIS — H16213 Exposure keratoconjunctivitis, bilateral: Secondary | ICD-10-CM | POA: Insufficient documentation

## 2021-03-28 ENCOUNTER — Telehealth: Payer: Self-pay

## 2021-03-28 DIAGNOSIS — Z59819 Housing instability, housed unspecified: Secondary | ICD-10-CM | POA: Insufficient documentation

## 2021-03-28 DIAGNOSIS — Z599 Problem related to housing and economic circumstances, unspecified: Secondary | ICD-10-CM | POA: Insufficient documentation
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
## 2021-03-28 NOTE — Telephone Encounter (Signed)
Surgery is for her eyelids.  I would go ahead and start PT.  She can get recommendation from her surgeon about holding PT around the time of surgery.

## 2021-03-28 NOTE — Telephone Encounter (Signed)
Patient was advised and states that she will contact her surgeon, she states that she wanted to let you know that she is not have surgery on her eyelids. Patient states "Im having my muscles flipped from my eyes, skin removed from my forehead and then my eyelids will be lifted."KW

## 2021-03-28 NOTE — Assessment & Plan Note (Signed)
Remains uncontrolled Feels like her high stress levels may be contributing Continue diet and exercise Congratulated on significant weight loss Had dizziness and symptoms of low blood pressure on lisinopril Increase Coreg slightly to see if this will manage blood pressure Encourage monitoring home blood pressure
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
## 2021-03-28 NOTE — Telephone Encounter (Signed)
Copied from [place] [id number]. Topic: General - Other >> Mar 27, 2021  2:55 PM Yvette Rack wrote: Reason for CRM: Pt stated she is scheduled to begin PT on Friday 03/31/21 but she is scheduled for surgery on June 6 and she would like to know if she should delay PT until the surgery is completed. Pt requests call back

## 2021-03-28 NOTE — Assessment & Plan Note (Signed)
Congratulated on weight loss ?Discussed importance of healthy weight management ?Discussed diet and exercise  ?

## 2021-03-28 NOTE — Assessment & Plan Note (Signed)
Chronic and stable Continue to monitor Reassurance given

## 2021-03-28 NOTE — Telephone Encounter (Signed)
Noted  

## 2021-03-30 ENCOUNTER — Other Ambulatory Visit: Payer: Medicare Other

## 2021-03-31 DIAGNOSIS — M5451 Vertebrogenic low back pain: Secondary | ICD-10-CM | POA: Diagnosis not present

## 2021-04-03 ENCOUNTER — Other Ambulatory Visit: Payer: Self-pay | Admitting: Family Medicine

## 2021-04-04 DIAGNOSIS — M5451 Vertebrogenic low back pain: Secondary | ICD-10-CM | POA: Diagnosis not present

## 2021-04-07 DIAGNOSIS — M5451 Vertebrogenic low back pain: Secondary | ICD-10-CM | POA: Diagnosis not present

## 2021-04-10 ENCOUNTER — Ambulatory Visit: Payer: Self-pay | Admitting: Urology

## 2021-04-11 ENCOUNTER — Other Ambulatory Visit: Payer: Medicare Other

## 2021-04-13 ENCOUNTER — Encounter: Payer: Self-pay | Admitting: Urology

## 2021-04-15 ENCOUNTER — Other Ambulatory Visit: Payer: Self-pay | Admitting: Family Medicine

## 2021-04-15 NOTE — Telephone Encounter (Signed)
Requested Prescriptions  Pending Prescriptions Disp Refills  . DULoxetine (CYMBALTA) 60 MG capsule [Pharmacy Med Name: DULOXETINE HCL DR 60 MG CAP] 90 capsule 0    Sig: TAKE 1 CAPSULE BY MOUTH EVERY DAY     Psychiatry: Antidepressants - SNRI Failed - 04/15/2021  9:06 AM      Failed - Last BP in normal range    BP Readings from Last 1 Encounters:  03/07/21 (!) 153/82         Passed - Completed PHQ-2 or PHQ-9 in the last 360 days      Passed - Valid encounter within last 6 months    Recent Outpatient Visits          1 month ago Housing instability   [facility] [place], Dionne Bucy, MD   3 months ago Type 2 diabetes mellitus with diabetic polyneuropathy, without long-term current use of insulin ([place])   [facility], Dionne Bucy, MD   4 months ago Weight loss   [facility], Dionne Bucy, MD   5 months ago Grief   [facility], Dionne Bucy, MD   5 months ago Candidal intertrigo   [facility] [place], Dionne Bucy, MD      Future Appointments            In 2 days Bacigalupo, Dionne Bucy, MD [facility], [place]           . traZODone ([place]) 50 MG tablet [Pharmacy Med Name: TRAZODONE 50 MG TABLET] 90 tablet     Sig: TAKE 1 TABLET BY MOUTH EVERY DAY AT BEDTIME AS NEEDED FOR SLEEP     Psychiatry: Antidepressants - Serotonin Modulator Passed - 04/15/2021  9:06 AM      Passed - Completed PHQ-2 or PHQ-9 in the last 360 days      Passed - Valid encounter within last 6 months    Recent Outpatient Visits          1 month ago Housing instability   [facility] [place], Dionne Bucy, MD   3 months ago Type 2 diabetes mellitus with diabetic polyneuropathy, without long-term current use of insulin [facility])   [facility] [place], Dionne Bucy, MD   4 months ago Weight loss   [facility], Dionne Bucy, MD   5 months ago Grief    [facility], Dionne Bucy, MD   5 months ago Candidal intertrigo   [facility] [place], Dionne Bucy, MD      Future Appointments            In 2 days Bacigalupo, Dionne Bucy, MD [facility], [place]

## 2021-04-17 ENCOUNTER — Ambulatory Visit (INDEPENDENT_AMBULATORY_CARE_PROVIDER_SITE_OTHER): Payer: Medicare Other | Admitting: Family Medicine

## 2021-04-17 ENCOUNTER — Encounter: Payer: Self-pay | Admitting: Family Medicine

## 2021-04-17 ENCOUNTER — Other Ambulatory Visit: Payer: Self-pay

## 2021-04-17 VITALS — BP 156/72 | HR 85 | Temp 98.6°F | Resp 16 | Wt 224.7 lb

## 2021-04-17 DIAGNOSIS — E669 Obesity, unspecified: Secondary | ICD-10-CM | POA: Diagnosis not present

## 2021-04-17 DIAGNOSIS — D509 Iron deficiency anemia, unspecified: Secondary | ICD-10-CM | POA: Diagnosis not present

## 2021-04-17 DIAGNOSIS — T466X5A Adverse effect of antihyperlipidemic and antiarteriosclerotic drugs, initial encounter: Secondary | ICD-10-CM

## 2021-04-17 DIAGNOSIS — G63 Polyneuropathy in diseases classified elsewhere: Secondary | ICD-10-CM | POA: Diagnosis not present

## 2021-04-17 DIAGNOSIS — E1142 Type 2 diabetes mellitus with diabetic polyneuropathy: Secondary | ICD-10-CM

## 2021-04-17 DIAGNOSIS — Z6834 Body mass index (BMI) 34.0-34.9, adult: Secondary | ICD-10-CM

## 2021-04-17 DIAGNOSIS — E785 Hyperlipidemia, unspecified: Secondary | ICD-10-CM | POA: Diagnosis not present

## 2021-04-17 DIAGNOSIS — F331 Major depressive disorder, recurrent, moderate: Secondary | ICD-10-CM | POA: Diagnosis not present

## 2021-04-17 DIAGNOSIS — F419 Anxiety disorder, unspecified: Secondary | ICD-10-CM | POA: Diagnosis not present

## 2021-04-17 DIAGNOSIS — M791 Myalgia, unspecified site: Secondary | ICD-10-CM

## 2021-04-17 DIAGNOSIS — E1169 Type 2 diabetes mellitus with other specified complication: Secondary | ICD-10-CM

## 2021-04-17 DIAGNOSIS — I152 Hypertension secondary to endocrine disorders: Secondary | ICD-10-CM

## 2021-04-17 DIAGNOSIS — E1159 Type 2 diabetes mellitus with other circulatory complications: Secondary | ICD-10-CM | POA: Diagnosis not present

## 2021-04-17 MED ORDER — LOSARTAN POTASSIUM 25 MG PO TABS: 25.0000 mg | ORAL_TABLET | Freq: Every day | ORAL | 1 refills | Status: DC

## 2021-04-17 MED ORDER — BUPROPION HCL ER (XL) 300 MG PO TB24: 300.0000 mg | ORAL_TABLET | Freq: Every day | ORAL | 3 refills | Status: DC

## 2021-04-17 NOTE — Assessment & Plan Note (Signed)
Chronic and recent worsening related to recent deaths in the family Continue cymbalta and wellbutrin Use klonopin sparingly

## 2021-04-17 NOTE — Assessment & Plan Note (Signed)
Previously fairly well-controlled with A1c of 7.1 on last check Goal A1c is less than 7 She is not currently on medications and trying diet and exercise to lower her blood sugar Up-to-date on eye exam Foot exam today Urine microalbumin today Up-to-date on vaccinations Recheck A1c

## 2021-04-17 NOTE — Assessment & Plan Note (Signed)
Chronic and uncontrolled Continue Coreg at current dose Add losartan 25 mg daily Recheck metabolic panel Follow-up in 3 months

## 2021-04-17 NOTE — Assessment & Plan Note (Signed)
Chronic and fairly well controlled Continue Lyrica at current dose

## 2021-04-17 NOTE — Assessment & Plan Note (Signed)
Uncontrolled with LDL greater than 200 on recent check Does not tolerate statins due to myalgias Now taking Zetia 10 mg daily Recheck CMP and FLP

## 2021-04-17 NOTE — Progress Notes (Signed)
Established patient visit   Patient: [name] [name]   DOB: 01/01/1951   70 y.o. Female  MRN: [id number] Visit Date: 04/17/2021  Today's healthcare provider: Lavon Paganini, MD   Chief Complaint  Patient presents with  . Hypertension  . Diabetes  . Depression  . Anxiety   Subjective    Hypertension Associated symptoms include anxiety. Pertinent negatives include no chest pain, headaches or shortness of breath.  Diabetes Pertinent negatives for hypoglycemia include no dizziness or headaches. Pertinent negatives for diabetes include no chest pain and no fatigue.  Depression        Associated symptoms include no fatigue and no headaches.  Past medical history includes anxiety.   Anxiety Patient reports no chest pain, dizziness, nausea or shortness of breath.      Hypertension, follow-up  BP Readings from Last 3 Encounters:  04/17/21 (!) 156/72  03/07/21 (!) 153/82  03/06/21 (!) 198/82   Wt Readings from Last 3 Encounters:  04/17/21 224 lb 11.2 oz (101.9 kg)  03/07/21 227 lb 11.2 oz (103.3 kg)  03/06/21 228 lb (103.4 kg)     She was last seen for hypertension 4 months ago.  BP at that visit was 154/93. Management since that visit includes increase Coreg 6.25 mg 2 times daily.  She reports excellent compliance with treatment. She is not having side effects.  She is following a Regular diet. Patient states that she eats all natural foods nothing pre-packaged She is exercising. She does not smoke.  Use of agents associated with hypertension: none.   Outside blood pressures are systolic Q000111Q, diastolic Q000111Q. Symptoms: No chest pain No chest pressure  No palpitations No syncope  No dyspnea No orthopnea  No paroxysmal nocturnal dyspnea No lower extremity edema   Pertinent labs: Lab Results  Component Value Date   CHOL 305 (H) 12/21/2020   HDL 47 12/21/2020   LDLCALC 222 (H) 12/21/2020   LDLDIRECT 183.0 12/13/2016   TRIG 186 (H) 12/21/2020   CHOLHDL  6.5 (H) 12/21/2020   Lab Results  Component Value Date   NA 137 12/21/2020   K 4.9 12/21/2020   CREATININE 0.95 12/21/2020   GFRNONAA 62 12/21/2020   GFRAA 72 12/21/2020   GLUCOSE 137 (H) 12/21/2020     The ASCVD Risk score (Goff DC Jr., et al., 2013) failed to calculate for the following reasons:   The patient has a prior MI or stroke diagnosis   --------------------------------------------------------------------------------------------------- Diabetes Mellitus Type II, Follow-up  Lab Results  Component Value Date   HGBA1C 7.1 (H) 12/21/2020   HGBA1C 9.8 (H) 02/03/2020   HGBA1C 10.4 (H) 10/19/2019   Wt Readings from Last 3 Encounters:  04/17/21 224 lb 11.2 oz (101.9 kg)  03/07/21 227 lb 11.2 oz (103.3 kg)  03/06/21 228 lb (103.4 kg)   Last seen for diabetes 4 months ago.  Management since then includes no changes. She reports excellent compliance with treatment. She is not having side effects.  Symptoms: No fatigue No foot ulcerations  No appetite changes No nausea  No paresthesia of the feet  No polydipsia  No polyuria No visual disturbances   No vomiting     Home blood sugar records: not being checked  Episodes of hypoglycemia? No    Current insulin regiment: none Most Recent Eye Exam: 01/24/21 Current exercise: tredmill Current diet habits: in general, a "healthy" diet    Pertinent Labs: Lab Results  Component Value Date   CHOL 305 (H) 12/21/2020  HDL 47 12/21/2020   LDLCALC 222 (H) 12/21/2020   LDLDIRECT 183.0 12/13/2016   TRIG 186 (H) 12/21/2020   CHOLHDL 6.5 (H) 12/21/2020   Lab Results  Component Value Date   NA 137 12/21/2020   K 4.9 12/21/2020   CREATININE 0.95 12/21/2020   GFRNONAA 62 12/21/2020   GFRAA 72 12/21/2020   GLUCOSE 137 (H) 12/21/2020     ---------------------------------------------------------------------------------------------------  Depression, Follow-up  She  was last seen for this 4 months ago. Changes made at  last visit include short-term low-dose Klonopin as needed, continue Cymbalta 60 mg daily.   She reports excellent compliance with treatment. She is not having side effects.   She reports good tolerance of treatment. Current symptoms include: depressed mood, difficulty concentrating, feelings of worthlessness/guilt and recurrent thoughts of death She feels she is Unchanged since last visit.  Depression screen [facility] 2/9 04/17/2021 03/07/2021 02/13/2021  Decreased Interest 2 2 1   Down, Depressed, Hopeless 2 2 3   PHQ - 2 Score 4 4 4   Altered sleeping 0 1 2  Tired, decreased energy 1 0 0  Change in appetite 1 0 0  Feeling bad or failure about yourself  1 1 0  Trouble concentrating 1 1 1   Moving slowly or fidgety/restless 1 0 0  Suicidal thoughts 1 1 0  PHQ-9 Score 10 8 7   Difficult doing work/chores Very difficult Somewhat difficult Not difficult at all  Some recent data might be hidden    She states that since her last visit, she has experienced two more deaths in addition to her husband and fiance. She reports that her cousin and her brother have died within the same month. She states that she spends her night crying and she has not cleaned her house in a long time because of her grieving. She states that she was taking Wellbutrin as prescribed but the side effects caused her to feel lightheadedness.  ----------------------------------------------------------------------------------------- Anxiety, Follow-up  She was last seen for anxiety 4 months ago. Changes made at last visit include short-term low-dose Konopin as needed, continue Cymbalta 60 mg daily.   She reports good compliance with treatment. She reports good tolerance of treatment. She is not having side effects.   She feels her anxiety is moderate and Worse since last visit.  Symptoms: No chest pain Yes difficulty concentrating  No dizziness Yes fatigue  No feelings of losing control No insomnia  No irritable No palpitations   Yes panic attacks Yes racing thoughts  No shortness of breath No sweating  No tremors/shakes    GAD-7 Results GAD-7 Generalized Anxiety Disorder Screening Tool 11/11/2020  1. Feeling Nervous, Anxious, or on Edge 0  3. Worrying Too Much About Different Things 0  4. Trouble Relaxing 0  5. Being So Restless it's Hard To Sit Still 0  6. Becoming Easily Annoyed or Irritable 0  7. Feeling Afraid As If Something Awful Might Happen 0  Difficulty At Work, Home, or Getting  Along With Others? Not difficult at all    PHQ-9 Scores PHQ9 SCORE ONLY 04/17/2021 03/07/2021 02/13/2021  PHQ-9 Total Score 10 8 7    She states that because of the recent deaths in her family she has been feeling more anxious than usual.   Migraines She states that she has recently been having "horrific" headaches. She has had migraines in the past but her symptoms resolved over the years. She states that she believes the reappearance of her migraines may be due to the recent deaths in  her family.  ---------------------------------------------------------------------------------------------------   Patient Active Problem List   Diagnosis Date Noted  . Housing instability 03/28/2021  . Senile purpura ([place]) 03/07/2021  . Acute anxiety 03/07/2021  . Panniculitis 02/03/2021  . Myalgia due to statin 12/26/2020  . Telogen effluvium 12/26/2020  . Weight loss 12/06/2020  . Candidal intertrigo 12/06/2020  . Microcytic anemia 12/06/2020  . Polyneuropathy associated with underlying disease ([place]) 02/03/2020  . Grief 10/20/2019  . Mixed stress and urge urinary incontinence 08/21/2019  . Avitaminosis D 07/15/2019  . Drug-induced constipation 06/12/2019  . General weakness 07/09/2018  . Lumbar spondylosis 07/08/2018  . Chronic bilateral low back pain with bilateral sciatica 07/08/2018  . Chronic pain syndrome 07/08/2018  . Urinary incontinence 03/19/2018  . Complex endometrial hyperplasia with atypia 09/03/2017  . Atypical  endometrial hyperplasia 08/15/2017  . Lymphedema 01/31/2017  . Hyperlipidemia associated with type 2 diabetes mellitus ([place]) 12/14/2016  . Hypertension associated with diabetes ([place]) 12/14/2016  . Chronic systolic heart failure ([place]) 10/06/2015  . History of MI (myocardial infarction) 09/20/2015  . Allergic rhinitis 07/06/2015  . Chronic venous insufficiency 07/06/2015  . Atherosclerosis of coronary artery 07/06/2015  . MDD (major depressive disorder) 07/06/2015  . Encounter for long-term (current) use of other medications 07/26/2014  . Diabetes ([place]) 04/08/2014  . Migraines 04/08/2014  . Gastroduodenal ulcer 04/08/2014  . Venous stasis 04/08/2014  . Osteoarthritis 04/08/2014  . Coronary artery disease 04/08/2014  . Peptic ulcer disease 04/08/2014  . Obesity 04/08/2014  . Rheumatoid arthritis ([place]) 04/06/2014   Social History   Tobacco Use  . Smoking status: Former Smoker    Packs/day: 1.00    Years: 29.00    Pack years: 29.00    Quit date: 12/03/1996    Years since quitting: 24.3  . Smokeless tobacco: Never Used  Vaping Use  . Vaping Use: Never used  Substance Use Topics  . Alcohol use: No  . Drug use: No   Allergies  Allergen Reactions  . Latex Itching    itching  . Doxycycline Rash  . Sulfa Antibiotics Itching, Rash and Swelling       Medications: Outpatient Medications Prior to Visit  Medication Sig  . carvedilol (COREG) 6.25 MG tablet Take 1 tablet (6.25 mg total) by mouth 2 (two) times daily with a meal.  . clonazePAM (KLONOPIN) 0.5 MG tablet Take 0.5-1 tablets (0.25-0.5 mg total) by mouth 2 (two) times daily as needed for anxiety.  . clotrimazole-betamethasone (LOTRISONE) cream Apply 1 application topically 2 (two) times daily.  . DULoxetine (CYMBALTA) 60 MG capsule TAKE 1 CAPSULE BY MOUTH EVERY DAY  . ezetimibe (ZETIA) 10 MG tablet Take 1 tablet (10 mg total) by mouth daily.  . pregabalin (LYRICA) 75 MG capsule TAKE 1 CAPSULE BY MOUTH 3 TIMES DAILY.  [name]  tolterodine (DETROL LA) 4 MG 24 hr capsule Take 1 capsule (4 mg total) by mouth daily.  . Vibegron 75 MG TABS Take 75 mg by mouth daily.   No facility-administered medications prior to visit.    Review of Systems  Constitutional: Negative for chills, fatigue and fever.  HENT: Negative for congestion, ear pain, rhinorrhea, sinus pain and sore throat.   Respiratory: Negative for cough, shortness of breath and wheezing.   Cardiovascular: Negative for chest pain and leg swelling.  Gastrointestinal: Negative for abdominal pain, blood in stool, diarrhea, nausea and vomiting.  Genitourinary: Negative for dysuria, flank pain, frequency and urgency.  Neurological: Negative for dizziness and headaches.  Psychiatric/Behavioral: Positive for depression.  Objective    BP (!) 156/72   Pulse 85   Temp 98.6 F (37 C) (Oral)   Resp 16   Wt 224 lb 11.2 oz (101.9 kg)   LMP  (LMP Unknown) Comment: age 72  SpO2 98%   BMI 34.17 kg/m  BP Readings from Last 3 Encounters:  04/17/21 (!) 156/72  03/07/21 (!) 153/82  03/06/21 (!) 198/82   Wt Readings from Last 3 Encounters:  04/17/21 224 lb 11.2 oz (101.9 kg)  03/07/21 227 lb 11.2 oz (103.3 kg)  03/06/21 228 lb (103.4 kg)      Physical Exam Vitals reviewed.  Constitutional:      General: She is not in acute distress.    Appearance: Normal appearance. She is well-developed. She is not diaphoretic.  HENT:     Head: Normocephalic and atraumatic.  Eyes:     General: No scleral icterus.    Conjunctiva/sclera: Conjunctivae normal.  Neck:     Thyroid: No thyromegaly.  Cardiovascular:     Rate and Rhythm: Normal rate and regular rhythm.     Pulses: Normal pulses.     Heart sounds: Normal heart sounds. No murmur heard.   Pulmonary:     Effort: Pulmonary effort is normal. No respiratory distress.     Breath sounds: Normal breath sounds. No wheezing, rhonchi or rales.  Musculoskeletal:     Cervical back: Neck supple.     Right lower  leg: No edema.     Left lower leg: No edema.  Lymphadenopathy:     Cervical: No cervical adenopathy.  Skin:    General: Skin is warm and dry.     Findings: No rash.  Neurological:     Mental Status: She is alert and oriented to person, place, and time. Mental status is at baseline.  Psychiatric:        Mood and Affect: Mood is anxious and depressed. Affect is flat.        Behavior: Behavior normal.       No results found for any visits on 04/17/21.  Assessment & Plan     Problem List Items Addressed This Visit      Cardiovascular and Mediastinum   Hypertension associated with diabetes ([place]) - Primary    Chronic and uncontrolled Continue Coreg at current dose Add losartan 25 mg daily Recheck metabolic panel Follow-up in 3 months      Relevant Medications   losartan (COZAAR) 25 MG tablet   Other Relevant Orders   Comprehensive metabolic panel     Endocrine   Diabetes ([place])    Previously fairly well-controlled with A1c of 7.1 on last check Goal A1c is less than 7 She is not currently on medications and trying diet and exercise to lower her blood sugar Up-to-date on eye exam Foot exam today Urine microalbumin today Up-to-date on vaccinations Recheck A1c      Relevant Medications   losartan (COZAAR) 25 MG tablet   Other Relevant Orders   Hemoglobin A1c   Hyperlipidemia associated with type 2 diabetes mellitus ([place])    Uncontrolled with LDL greater than 200 on recent check Does not tolerate statins due to myalgias Now taking Zetia 10 mg daily Recheck CMP and FLP      Relevant Medications   losartan (COZAAR) 25 MG tablet   Other Relevant Orders   Comprehensive metabolic panel   Lipid panel     Nervous and Auditory   Polyneuropathy associated with underlying disease ([place])  Chronic and fairly well controlled Continue Lyrica at current dose      Relevant Medications   buPROPion (WELLBUTRIN XL) 300 MG 24 hr tablet     Other   MDD (major depressive  disorder)    Chronic and uncontrolled Worsening in setting of 2 deaths in last few months Continue cymbalta at current dose  Increase wellbutrin XL to 300mg  daily Referral for therapy Contracted for safety      Relevant Medications   buPROPion (WELLBUTRIN XL) 300 MG 24 hr tablet   Other Relevant Orders   Ambulatory referral to Psychology   Obesity    Discussed importance of healthy weight management Discussed diet and exercise       Microcytic anemia    Recheck CBC and iron panel No known bleeding source Consider GI and Hem referrals pending repeat labs      Relevant Orders   CBC w/Diff/Platelet   Fe+TIBC+Fer   Myalgia due to statin   Acute anxiety    Chronic and recent worsening related to recent deaths in the family Continue cymbalta and wellbutrin Use klonopin sparingly      Relevant Medications   buPROPion (WELLBUTRIN XL) 300 MG 24 hr tablet   Other Relevant Orders   Ambulatory referral to Psychology       Return in about 3 months (around 07/18/2021) for chronic disease f/u.       Frederic Jericho [place],acting as a Education administrator for Lavon Paganini, MD.,have documented all relevant documentation on the behalf of Lavon Paganini, MD,as directed by  Lavon Paganini, MD while in the presence of Lavon Paganini, MD.  I, Lavon Paganini, MD, have reviewed all documentation for this visit. The documentation on 04/17/21 for the exam, diagnosis, procedures, and orders are all accurate and complete.   Khy Pitre, Dionne Bucy, MD, MPH [facility]

## 2021-04-17 NOTE — Assessment & Plan Note (Signed)
Discussed importance of healthy weight management Discussed diet and exercise  

## 2021-04-17 NOTE — Assessment & Plan Note (Signed)
Chronic and uncontrolled Worsening in setting of 2 deaths in last few months Continue cymbalta at current dose  Increase wellbutrin XL to 300mg  daily Referral for therapy Contracted for safety

## 2021-04-17 NOTE — Assessment & Plan Note (Signed)
Recheck CBC and iron panel No known bleeding source Consider GI and Hem referrals pending repeat labs

## 2021-04-18 ENCOUNTER — Telehealth: Payer: Self-pay

## 2021-04-18 DIAGNOSIS — M5451 Vertebrogenic low back pain: Secondary | ICD-10-CM | POA: Diagnosis not present

## 2021-04-18 LAB — HEMOGLOBIN A1C
Est. average glucose Bld gHb Est-mCnc: 180 mg/dL
Hgb A1c MFr Bld: 7.9 % — ABNORMAL HIGH (ref 4.8–5.6)

## 2021-04-18 LAB — CBC WITH DIFFERENTIAL/PLATELET
Basophils Absolute: 0 10*3/uL (ref 0.0–0.2)
Basos: 1 %
EOS (ABSOLUTE): 0.2 10*3/uL (ref 0.0–0.4)
Eos: 3 %
Hematocrit: 36.7 % (ref 34.0–46.6)
Hemoglobin: 11.6 g/dL (ref 11.1–15.9)
Immature Grans (Abs): 0 10*3/uL (ref 0.0–0.1)
Immature Granulocytes: 1 %
Lymphocytes Absolute: 1.4 10*3/uL (ref 0.7–3.1)
Lymphs: 29 %
MCH: 24.2 pg — ABNORMAL LOW (ref 26.6–33.0)
MCHC: 31.6 g/dL (ref 31.5–35.7)
MCV: 77 fL — ABNORMAL LOW (ref 79–97)
Monocytes Absolute: 0.4 10*3/uL (ref 0.1–0.9)
Monocytes: 8 %
Neutrophils Absolute: 3 10*3/uL (ref 1.4–7.0)
Neutrophils: 58 %
Platelets: 189 10*3/uL (ref 150–450)
RBC: 4.8 x10E6/uL (ref 3.77–5.28)
RDW: 16.9 % — ABNORMAL HIGH (ref 11.7–15.4)
WBC: 5 10*3/uL (ref 3.4–10.8)

## 2021-04-18 LAB — COMPREHENSIVE METABOLIC PANEL
ALT: 11 IU/L (ref 0–32)
AST: 15 IU/L (ref 0–40)
Albumin/Globulin Ratio: 1.3 (ref 1.2–2.2)
Albumin: 3.9 g/dL (ref 3.8–4.8)
Alkaline Phosphatase: 82 IU/L (ref 44–121)
BUN/Creatinine Ratio: 47 — ABNORMAL HIGH (ref 12–28)
BUN: 37 mg/dL — ABNORMAL HIGH (ref 8–27)
Bilirubin Total: <0.2 mg/dL (ref 0.0–1.2)
CO2: 23 mmol/L (ref 20–29)
Calcium: 9.2 mg/dL (ref 8.7–10.3)
Chloride: 100 mmol/L (ref 96–106)
Creatinine, Ser: 0.78 mg/dL (ref 0.57–1.00)
Globulin, Total: 2.9 g/dL (ref 1.5–4.5)
Glucose: 146 mg/dL — ABNORMAL HIGH (ref 65–99)
Potassium: 4.5 mmol/L (ref 3.5–5.2)
Sodium: 139 mmol/L (ref 134–144)
Total Protein: 6.8 g/dL (ref 6.0–8.5)
eGFR: 83 mL/min/{1.73_m2} (ref >59)

## 2021-04-18 LAB — IRON,TIBC AND FERRITIN PANEL
Ferritin: 19 ng/mL (ref 15–150)
Iron Saturation: 10 % — ABNORMAL LOW (ref 15–55)
Iron: 34 ug/dL (ref 27–139)
Total Iron Binding Capacity: 332 ug/dL (ref 250–450)
UIBC: 298 ug/dL (ref 118–369)

## 2021-04-18 LAB — LIPID PANEL
Chol/HDL Ratio: 5.4 ratio — ABNORMAL HIGH (ref 0.0–4.4)
Cholesterol, Total: 267 mg/dL — ABNORMAL HIGH (ref 100–199)
HDL: 49 mg/dL (ref >39)
LDL Chol Calc (NIH): 199 mg/dL — ABNORMAL HIGH (ref 0–99)
Triglycerides: 108 mg/dL (ref 0–149)
VLDL Cholesterol Cal: 19 mg/dL (ref 5–40)

## 2021-04-18 MED ORDER — METFORMIN HCL 500 MG PO TABS: 500.0000 mg | ORAL_TABLET | Freq: Two times a day (BID) | ORAL | 1 refills | Status: DC

## 2021-04-18 NOTE — Telephone Encounter (Signed)
-----   Message from Virginia Crews, MD sent at 04/18/2021  8:38 AM EDT ----- Normal labs, except for A1c elevated at 7.9 and cholesterol is significantly elevated.  I know that she did not tolerate statins due to myalgias.  If she is taking Zetia regularly, we should consider referral to cardiology for consideration of PCSK9 inhibitor to lower cholesterol and lower heart disease/stroke risk.  Okay to place if patient agrees.  We need to resume her medication for her diabetes.  Recommend metformin 500 mg twice daily.  Okay to send in 90-day supply with 1 refill if patient agrees.

## 2021-04-21 DIAGNOSIS — M5451 Vertebrogenic low back pain: Secondary | ICD-10-CM | POA: Diagnosis not present

## 2021-04-25 DIAGNOSIS — M5451 Vertebrogenic low back pain: Secondary | ICD-10-CM | POA: Diagnosis not present

## 2021-05-04 DIAGNOSIS — M5451 Vertebrogenic low back pain: Secondary | ICD-10-CM | POA: Diagnosis not present

## 2021-05-09 DIAGNOSIS — H02105 Unspecified ectropion of left lower eyelid: Secondary | ICD-10-CM | POA: Diagnosis not present

## 2021-05-09 DIAGNOSIS — M069 Rheumatoid arthritis, unspecified: Secondary | ICD-10-CM | POA: Diagnosis not present

## 2021-05-09 DIAGNOSIS — H02102 Unspecified ectropion of right lower eyelid: Secondary | ICD-10-CM | POA: Diagnosis not present

## 2021-05-09 DIAGNOSIS — H02831 Dermatochalasis of right upper eyelid: Secondary | ICD-10-CM | POA: Diagnosis not present

## 2021-05-09 DIAGNOSIS — Z87891 Personal history of nicotine dependence: Secondary | ICD-10-CM | POA: Diagnosis not present

## 2021-05-09 DIAGNOSIS — H02834 Dermatochalasis of left upper eyelid: Secondary | ICD-10-CM | POA: Diagnosis not present

## 2021-05-09 DIAGNOSIS — H02132 Senile ectropion of right lower eyelid: Secondary | ICD-10-CM | POA: Diagnosis not present

## 2021-05-09 DIAGNOSIS — H02135 Senile ectropion of left lower eyelid: Secondary | ICD-10-CM | POA: Diagnosis not present

## 2021-05-09 DIAGNOSIS — H02403 Unspecified ptosis of bilateral eyelids: Secondary | ICD-10-CM | POA: Diagnosis not present

## 2021-05-09 DIAGNOSIS — H57813 Brow ptosis, bilateral: Secondary | ICD-10-CM | POA: Diagnosis not present

## 2021-05-09 DIAGNOSIS — Z91040 Latex allergy status: Secondary | ICD-10-CM | POA: Diagnosis not present

## 2021-05-09 DIAGNOSIS — H16213 Exposure keratoconjunctivitis, bilateral: Secondary | ICD-10-CM | POA: Diagnosis not present

## 2021-05-13 ENCOUNTER — Other Ambulatory Visit: Payer: Self-pay | Admitting: Family Medicine

## 2021-05-13 NOTE — Telephone Encounter (Signed)
Must have follow up in office. This is a new prescription and 90 day refill not appropriate.

## 2021-05-15 ENCOUNTER — Ambulatory Visit: Payer: Medicare Other | Admitting: Urology

## 2021-05-25 ENCOUNTER — Other Ambulatory Visit: Payer: Self-pay

## 2021-05-25 ENCOUNTER — Ambulatory Visit (INDEPENDENT_AMBULATORY_CARE_PROVIDER_SITE_OTHER): Payer: Medicare Other | Admitting: Surgical

## 2021-05-25 ENCOUNTER — Encounter: Payer: Self-pay | Admitting: Surgical

## 2021-05-25 DIAGNOSIS — M793 Panniculitis, unspecified: Secondary | ICD-10-CM | POA: Diagnosis not present

## 2021-05-25 DIAGNOSIS — I872 Venous insufficiency (chronic) (peripheral): Secondary | ICD-10-CM

## 2021-05-25 DIAGNOSIS — M0579 Rheumatoid arthritis with rheumatoid factor of multiple sites without organ or systems involvement: Secondary | ICD-10-CM | POA: Diagnosis not present

## 2021-05-25 DIAGNOSIS — M5441 Lumbago with sciatica, right side: Secondary | ICD-10-CM | POA: Diagnosis not present

## 2021-05-25 DIAGNOSIS — G8929 Other chronic pain: Secondary | ICD-10-CM

## 2021-05-25 DIAGNOSIS — M5442 Lumbago with sciatica, left side: Secondary | ICD-10-CM | POA: Diagnosis not present

## 2021-05-25 NOTE — Progress Notes (Signed)
   Subjective:     Patient ID: [name], female    DOB: [birth year], 70 y.o.   MRN: [id number]  Chief Complaint  Patient presents with   Follow-up    HPI: The patient is a 70 y.o. female here for follow-up after completion of physical therapy for back pain related to overhanging abdominal pannus.  She reports that she had some improvement in strength in her legs and core but still having daily back pain.  She is still having a lot of rashes and irritation within the abdominal fold.  She reports that she currently has a yeast infection that is very bothersome to her.  She has been using steroid cream which was prescribed by her PCP for this.  She reports that with the heat she is noticing worsening symptoms.  She reports that she has had 5 or 6 weeks of physical therapy with 2 appointments per week.  Review of Systems  Musculoskeletal:  Positive for back pain.  Skin:  Positive for color change and rash.    Objective:   Vital Signs LMP  (LMP Unknown) Comment: age 75 Vital Signs and Nursing Note Reviewed  Physical Exam Constitutional:      General: She is not in acute distress.    Appearance: She is obese. She is not ill-appearing.  HENT:     Head: Normocephalic and atraumatic.  Neurological:     General: No focal deficit present.     Mental Status: She is alert and oriented to person, place, and time. Mental status is at baseline.  Psychiatric:        Mood and Affect: Mood normal.        Behavior: Behavior normal.      Assessment/Plan:     ICD-10-CM   1. Morbid obesity ([place])  E66.01     2. Rheumatoid arthritis involving multiple sites with positive rheumatoid factor (HCC)  M05.79     3. Chronic bilateral low back pain with bilateral sciatica  M54.42    M54.41    G89.29     4. Chronic venous insufficiency  I87.2     5. Panniculitis  M79.3       Patient is a 70 year old female here for follow-up to discuss abdominal pannus and skin irritation.  She has completed  physical therapy for her low back pain and reports no improvement in her low back pain.  She is still interested in pursuing surgical intervention for panniculectomy.  She is having ongoing irritation of the abdominal fold and reports it is worse with the current warm weather that we are having.  She is currently using steroid cream which was prescribed by her PCP for this.    I discussed with her that I would route this to our surgical scheduling team to work on Neurosurgeon for insurance authorization.  Recommend calling with questions or concerns.  A total of 15 minutes was spent on today's encounter which included taking history, discussing patient's completion of physical therapy, discussing the plan with the patient, dictating patient's note and reviewing EMR records.   Carola Rhine Mart [place], PA-C 05/25/2021, 2:35 PM

## 2021-05-26 ENCOUNTER — Ambulatory Visit: Payer: Medicare Other | Admitting: Surgical

## 2021-06-03 ENCOUNTER — Other Ambulatory Visit: Payer: Self-pay | Admitting: Family Medicine

## 2021-06-03 NOTE — Telephone Encounter (Signed)
last RF 03/07/21 #60 3 RF should have enough med to last until 07/07/21

## 2021-06-19 ENCOUNTER — Other Ambulatory Visit: Payer: Self-pay

## 2021-06-19 ENCOUNTER — Ambulatory Visit (INDEPENDENT_AMBULATORY_CARE_PROVIDER_SITE_OTHER): Payer: Medicare Other | Admitting: Urology

## 2021-06-19 VITALS — BP 173/107 | HR 87

## 2021-06-19 DIAGNOSIS — N3946 Mixed incontinence: Secondary | ICD-10-CM | POA: Diagnosis not present

## 2021-06-19 MED ORDER — GEMTESA 75 MG PO TABS: 75.0000 mg | ORAL_TABLET | Freq: Every day | ORAL | 3 refills | Status: DC

## 2021-06-19 NOTE — Progress Notes (Signed)
06/19/2021 3:45 PM   [name] [name] [birth year] [id number]  Referring provider: Virginia Crews, MD [address] [place],  [place] [address]  Chief Complaint  Patient presents with   Urinary Incontinence    HPI: 2019: Patient was consulted to be assessed for worsening urinary incontinence since a hysterectomy in October 2018.  She leaks with coughing sneezing bending lifting a significant amount.  She wears 10-15 pads a day that are soaked.  She has urge incontinence.  She can run water and leak without awareness. She will leak when she goes from a sitting to standing position.  She has foot on the floor syndrome.  She has mild bedwetting but not every night   She voids every 30 to 45 minutes and cannot hold it for 2 hours.  She gets up 5-6 times a night.  She has left ankle edema.   The pelvic examination was a little bit limited by her obesity.  She had grade 2 hypermobility the bladder neck and a negative cough test but her bladder I believe is quite empty.  She had a large suprapubic fat pad   Patient has high-volume mixed incontinence with typical triggers.  She has intermittent milder bedwetting.  She has severe frequency.  She has significant nocturia.  If the patient ever needed a sling she would need a mini sling and not a retropubic    On urodynamics she did not void but her bladder was empty.  Her maximum bladder capacity was 100 mL.  She had sensory urgency.  The maximum detrusor pressure was 38 cm water occurring at 62 mL.  She had to void off the contraction.  She had no leakage associated with coughing reaching a pressure of 148 cm of water.  She was triggering instability.  The instability was provoked and unprovoked throughout.  During voluntary voiding she voided 76 mils of maximum voiding pressure 21 cm of water.  She emptied efficiently.  Bladder neck descent at 1 cm.   At least 90% of the patient's problem is not overactive bladder.  If she fails  medical and behavioral therapy I would be offering her a refractory overactive bladder therapy.  If she truly does have stress incontinence this could persist.   Based upon body habitus Botox and InterStim would not be ideal   Myrbetriq helped minimally.  She now only wears about 3 pads a day and only getting up twice at night   She is concerned about percutaneous tibial nerve stimulation because of neuropathy and a skin condition I believe.   Less frequency during the day.  Gets up twice instead of four times.  Three pads per day incontinence stable.    Detrol works better than the oxybutynin gave side effect.  Still using 3 pads a day.  Getting up 2-4 times a day.  Overall much better.  I think we have reached the end of the algorithm.  She thought about percutaneous tibial nerve stimulation but is chosen not to.  Reassess in the year on Detrol 70   Clinically not infected.  She wanted speak about percutaneous tibial nerve stimulation in detail.  I gave her the handout   Patient still has urge incontinence wearing 3 pads a day.  Detrol helps some.  Clinically not infected.  Frequency stable.  She started to do to do percutaneous tibial nerve stimulation treatments but was having trouble with neuropathy and some leg ulcers and stopped.  She is lost 160 pounds.  She is going to have plastic surgery.    I sent 90x3 Detrol.  I gave her the new beta 3 agonists with samples and prescription.  She will stay on combination if it works well.  Today Patient has had a dramatic improvement with Gemtesa the new beta 3 agonists greatly reducing her urgency incontinence and improving her quality life.  She is leaking a lot less.  The Detrol is helping minimally but the 2 together are working well.  Clinically not infected.  She is now lost 170 pounds.  She is going to have plastic surgery on September 7 to remove with her pannus  She is failed Myrbetriq and double other antimuscarinics.  She is failed  percutaneous tibial nerve stimulation.  Due to obesity was not a good candidate for InterStim and Botox  Samples and prescription given.   PMH: Past Medical History:  Diagnosis Date   Arthritis    Rhumetoid arthritis   BRCA negative 07/2017   MyRisk neg   Cancer ([place])    uterine   Cellulitis of both lower extremities    Chronic   CHF (congestive heart failure) (HCC)    Coronary artery disease    Diabetes mellitus without complication ([place])    Family history of breast cancer 07/2017   MyRisk neg; IBIS=10.5%/riskscore=17%   Family history of ovarian cancer    Fibromyalgia    Fibromyalgia affecting shoulder region    Hyperlipidemia    Hypertension    Spinal stenosis     Surgical History: Past Surgical History:  Procedure Laterality Date   ABLATION SAPHENOUS VEIN W/ RFA     ANAL FISSURECTOMY  01/2004   CORONARY ANGIOPLASTY     CYSTOSCOPY  09/03/2017   Procedure: CYSTOSCOPY;  Surgeon: Gae Dry, MD;  Location: [facility] ORS;  Service: Gynecology;;   DILATION AND CURETTAGE OF UTERUS     HERNIA REPAIR     LAPAROSCOPIC HYSTERECTOMY N/A 09/03/2017   Procedure: HYSTERECTOMY TOTAL LAPAROSCOPIC BSO;  Surgeon: Gae Dry, MD;  Location: [facility] ORS;  Service: Gynecology;  Laterality: N/A;   MEDIAL PARTIAL KNEE REPLACEMENT Left 70/17/7939   UMBILICAL HERNIA REPAIR N/A 09/05/2015   Procedure: HERNIA REPAIR incarcerated UMBILICAL ADULT;  Surgeon: Sherri Rad, MD;  Location: [facility] ORS;  Service: General;  Laterality: N/A;   Vascular Stent  11/04/2013    Home Medications:  Allergies as of 06/19/2021       Reactions   Sulfa Antibiotics Itching, Rash, Swelling   Latex Itching   itching   Doxycycline Rash   Empagliflozin Itching, Other (See Comments)   Itching and frequent yeast infections        Medication List        Accurate as of June 19, 2021  3:45 PM. If you have any questions, ask your nurse or doctor.          STOP taking these medications    Vibegron 75 MG  Tabs Stopped by: Reece Packer, MD       TAKE these medications    buPROPion 300 MG 24 hr tablet Commonly known as: Wellbutrin XL Take 1 tablet (300 mg total) by mouth daily.   carvedilol 6.25 MG tablet Commonly known as: COREG Take 1 tablet (6.25 mg total) by mouth 2 (two) times daily with a meal.   clonazePAM 0.5 MG tablet Commonly known as: KLONOPIN Take 0.5-1 tablets (0.25-0.5 mg total) by mouth 2 (two) times daily as needed for anxiety.   clotrimazole-betamethasone cream Commonly known  as: LOTRISONE Apply 1 application topically 2 (two) times daily.   DULoxetine 60 MG capsule Commonly known as: CYMBALTA TAKE 1 CAPSULE BY MOUTH EVERY DAY   ezetimibe 10 MG tablet Commonly known as: Zetia Take 1 tablet (10 mg total) by mouth daily.   losartan 25 MG tablet Commonly known as: COZAAR Take 1 tablet (25 mg total) by mouth daily.   metFORMIN 500 MG tablet Commonly known as: GLUCOPHAGE Take 1 tablet (500 mg total) by mouth 2 (two) times daily with a meal.   pregabalin 75 MG capsule Commonly known as: LYRICA Take by mouth.   pregabalin 75 MG capsule Commonly known as: LYRICA TAKE 1 CAPSULE BY MOUTH 3 TIMES DAILY.   tolterodine 4 MG 24 hr capsule Commonly known as: Detrol LA Take 1 capsule (4 mg total) by mouth daily.        Allergies:  Allergies  Allergen Reactions   Sulfa Antibiotics Itching, Rash and Swelling   Latex Itching    itching   Doxycycline Rash   Empagliflozin Itching and Other (See Comments)    Itching and frequent yeast infections    Family History: Family History  Problem Relation Age of Onset   Hypertension Mother    Cataracts Mother    Thyroid disease Mother    Dementia Mother    COPD Father    Dementia Father    Cataracts Father    Aneurysm Father        Abdominal & Brain   Diabetes Father    Melanoma Father    Breast cancer Sister 58   Fibromyalgia Sister    Migraines Sister    Hypertension Sister    Breast cancer  Sister 48   COPD Sister    Ovarian cancer Sister 4   Migraines Brother    Heart attack Brother    Colon cancer Paternal Uncle        12s   Colon cancer Paternal Uncle        32s   Uterine cancer Cousin    Uterine cancer Cousin     Social History:  reports that she quit smoking about 24 years ago. She has a 29.00 pack-year smoking history. She has never used smokeless tobacco. She reports that she does not drink alcohol and does not use drugs.  ROS:                                        Physical Exam: BP (!) 173/107   Pulse 87   LMP  (LMP Unknown) Comment: age 70  Constitutional:  Alert and oriented, No acute distress. HEENT: [place] AT, moist mucus membranes.  Trachea midline, no masses.  Laboratory Data: Lab Results  Component Value Date   WBC 5.0 04/17/2021   HGB 11.6 04/17/2021   HCT 36.7 04/17/2021   MCV 77 (L) 04/17/2021   PLT 189 04/17/2021    Lab Results  Component Value Date   CREATININE 0.78 04/17/2021    No results found for: PSA  No results found for: TESTOSTERONE  Lab Results  Component Value Date   HGBA1C 7.9 (H) 04/17/2021    Urinalysis    Component Value Date/Time   COLORURINE YELLOW (A) 09/04/2015 2209   APPEARANCEUR Cloudy (A) 09/28/2019 1530   LABSPEC 1.016 09/04/2015 2209   PHURINE 7.0 09/04/2015 2209   GLUCOSEU Trace (A) 09/28/2019 1530   HGBUR NEGATIVE 09/04/2015 2209  BILIRUBINUR Negative 09/28/2019 1530   KETONESUR 1+ (A) 09/04/2015 2209   PROTEINUR 2+ (A) 09/28/2019 1530   PROTEINUR NEGATIVE 09/04/2015 2209   NITRITE Negative 09/28/2019 1530   NITRITE NEGATIVE 09/04/2015 2209   LEUKOCYTESUR Negative 09/28/2019 1530    Pertinent Imaging:   Assessment & Plan: Reassess in 3 months.  There are no diagnoses linked to this encounter.  No follow-ups on file.  Reece Packer, MD  [facility] [address], [place], [place] [address] [phone]

## 2021-07-05 ENCOUNTER — Ambulatory Visit (HOSPITAL_COMMUNITY): Payer: Medicare Other | Admitting: Licensed Clinical Social Worker

## 2021-07-05 NOTE — Progress Notes (Signed)
Therapist contacted patient for an assessment through My Chart and she did not respond. Session is a no show.

## 2021-07-07 ENCOUNTER — Telehealth: Payer: Self-pay

## 2021-07-07 NOTE — Telephone Encounter (Signed)
PA pending on cover my meds. KEY: [id number]

## 2021-07-15 ENCOUNTER — Other Ambulatory Visit: Payer: Self-pay | Admitting: Family Medicine

## 2021-07-15 NOTE — Telephone Encounter (Signed)
Requested Prescriptions  Pending Prescriptions Disp Refills  . DULoxetine (CYMBALTA) 60 MG capsule [Pharmacy Med Name: DULOXETINE HCL DR 60 MG CAP] 90 capsule 0    Sig: TAKE 1 CAPSULE BY MOUTH EVERY DAY     Psychiatry: Antidepressants - SNRI Failed - 07/15/2021  4:04 PM      Failed - Last BP in normal range    BP Readings from Last 1 Encounters:  06/19/21 (!) 173/107         Passed - Completed PHQ-2 or PHQ-9 in the last 360 days      Passed - Valid encounter within last 6 months    Recent Outpatient Visits          2 months ago Hypertension associated with diabetes [facility])   [facility] [place], Dionne Bucy, MD   4 months ago Housing instability   [facility], Dionne Bucy, MD   6 months ago Type 2 diabetes mellitus with diabetic polyneuropathy, without long-term current use of insulin [facility])   [facility], Dionne Bucy, MD   7 months ago Weight loss   [facility] Dionne Bucy, MD   8 months ago Grief   [facility], Dionne Bucy, MD      Future Appointments            In 3 days [place], Dionne Bucy, MD [facility] [place]   In 2 months [place], Nicki Reaper, [facility]

## 2021-07-18 ENCOUNTER — Encounter: Payer: Self-pay | Admitting: Family Medicine

## 2021-07-18 ENCOUNTER — Ambulatory Visit: Payer: Medicare Other | Admitting: Surgical

## 2021-07-18 ENCOUNTER — Other Ambulatory Visit: Payer: Self-pay

## 2021-07-18 ENCOUNTER — Ambulatory Visit (INDEPENDENT_AMBULATORY_CARE_PROVIDER_SITE_OTHER): Payer: Medicare Other | Admitting: Family Medicine

## 2021-07-18 VITALS — BP 138/73 | HR 86 | Temp 98.0°F | Resp 16 | Ht 67.0 in | Wt 237.6 lb

## 2021-07-18 DIAGNOSIS — E785 Hyperlipidemia, unspecified: Secondary | ICD-10-CM | POA: Diagnosis not present

## 2021-07-18 DIAGNOSIS — M797 Fibromyalgia: Secondary | ICD-10-CM | POA: Diagnosis not present

## 2021-07-18 DIAGNOSIS — E1142 Type 2 diabetes mellitus with diabetic polyneuropathy: Secondary | ICD-10-CM

## 2021-07-18 DIAGNOSIS — D692 Other nonthrombocytopenic purpura: Secondary | ICD-10-CM

## 2021-07-18 DIAGNOSIS — I152 Hypertension secondary to endocrine disorders: Secondary | ICD-10-CM | POA: Diagnosis not present

## 2021-07-18 DIAGNOSIS — E1159 Type 2 diabetes mellitus with other circulatory complications: Secondary | ICD-10-CM

## 2021-07-18 DIAGNOSIS — E1169 Type 2 diabetes mellitus with other specified complication: Secondary | ICD-10-CM

## 2021-07-18 LAB — POCT GLYCOSYLATED HEMOGLOBIN (HGB A1C)
Est. average glucose Bld gHb Est-mCnc: 177
Hemoglobin A1C: 7.8 % — AB (ref 4.0–5.6)

## 2021-07-18 NOTE — Assessment & Plan Note (Signed)
Uncontrolled Recheck at next visit Continue zetia Unable to tolerate statins due to myalgias

## 2021-07-18 NOTE — Assessment & Plan Note (Signed)
Worsening as she has not been taking lyrica recently Will resume

## 2021-07-18 NOTE — Assessment & Plan Note (Signed)
Chronic and stable.   

## 2021-07-18 NOTE — Progress Notes (Signed)
Established patient visit   Patient: [name] [name]   DOB: 12/07/1950   70 y.o. Female  MRN: [id number] Visit Date: 07/18/2021  Today's healthcare provider: Lavon Paganini, MD   Chief Complaint  Patient presents with   Diabetes    Subjective  -------------------------------------------------------------------------------------------------------------------- Diabetes Pertinent negatives for diabetes include no chest pain and no fatigue.    Diabetes Mellitus Type II, follow-up  Lab Results  Component Value Date   HGBA1C 7.8 (A) 07/18/2021   HGBA1C 7.9 (H) 04/17/2021   HGBA1C 7.1 (H) 12/21/2020   Last seen for diabetes 3 months ago.  Management since then includes continuing healthy lifestyle changes.  Home blood sugar records:  not being checked  Episodes of hypoglycemia? No    Current insulin regiment: none Most Recent Eye Exam: UTD  --------------------------------------------------------------------------------------------------- Hypertension, follow-up  BP Readings from Last 3 Encounters:  07/18/21 138/73  06/19/21 (!) 173/107  04/17/21 (!) 156/72   Wt Readings from Last 3 Encounters:  07/18/21 237 lb 9.6 oz (107.8 kg)  04/17/21 224 lb 11.2 oz (101.9 kg)  03/07/21 227 lb 11.2 oz (103.3 kg)     She was last seen for hypertension 3 months ago.  BP at that visit was 156/72. Management since that visit includes. She reports excellent compliance with treatment. She is not having side effects.  She is exercising. She is adherent to low salt diet.   Outside blood pressures are stable.  She does not smoke.  Use of agents associated with hypertension: none.   --------------------------------------------------------------------------------------------------- Lipid/Cholesterol, follow-up  Last Lipid Panel: Lab Results  Component Value Date   CHOL 267 (H) 04/17/2021   LDLCALC 199 (H) 04/17/2021   LDLDIRECT 183.0 12/13/2016   HDL 49 04/17/2021    TRIG 108 04/17/2021    She was last seen for this 3 months ago.  Management since that visit includes no changes.  She reports excellent compliance with treatment. She is not having side effects.   Symptoms: No appetite changes No foot ulcerations  No chest pain No chest pressure/discomfort  No dyspnea No orthopnea  No fatigue No lower extremity edema  No palpitations No paroxysmal nocturnal dyspnea  No nausea No numbness or tingling of extremity  No polydipsia No polyuria  No speech difficulty No syncope   She is following a Low Sodium diet. Current exercise: walking  Fibromyalgia  She has fibromyalgia and she noticed an increase in bruising and pain in her shoulders. She believes it is associated with additional stress.   Last metabolic panel Lab Results  Component Value Date   GLUCOSE 146 (H) 04/17/2021   NA 139 04/17/2021   K 4.5 04/17/2021   BUN 37 (H) 04/17/2021   CREATININE 0.78 04/17/2021   GFRNONAA 62 12/21/2020   GFRAA 72 12/21/2020   CALCIUM 9.2 04/17/2021   AST 15 04/17/2021   ALT 11 04/17/2021   The ASCVD Risk score (Goff DC Jr., et al., 2013) failed to calculate for the following reasons:   The patient has a prior MI or stroke diagnosis  ---------------------------------------------------------------------------------------------------   Patient Active Problem List   Diagnosis Date Noted   Housing instability 03/28/2021   Senile purpura ([place]) 03/07/2021   Acute anxiety 03/07/2021   Panniculitis 02/03/2021   Myalgia due to statin 12/26/2020   Telogen effluvium 12/26/2020   Weight loss 12/06/2020   Candidal intertrigo 12/06/2020   Microcytic anemia 12/06/2020   Polyneuropathy associated with underlying disease ([place]) 02/03/2020   Grief 10/20/2019  Mixed stress and urge urinary incontinence 08/21/2019   Avitaminosis D 07/15/2019   Drug-induced constipation 06/12/2019   General weakness 07/09/2018   Lumbar spondylosis 07/08/2018   Chronic  bilateral low back pain with bilateral sciatica 07/08/2018   Chronic pain syndrome 07/08/2018   Urinary incontinence 03/19/2018   Complex endometrial hyperplasia with atypia 09/03/2017   Atypical endometrial hyperplasia 08/15/2017   Lymphedema 01/31/2017   Hyperlipidemia associated with type 2 diabetes mellitus ([place]) 12/14/2016   Hypertension associated with diabetes ([place]) [id number]   Chronic systolic heart failure ([place]) 10/06/2015   History of MI (myocardial infarction) 09/20/2015   Allergic rhinitis 07/06/2015   Chronic venous insufficiency 07/06/2015   Atherosclerosis of coronary artery 07/06/2015   MDD (major depressive disorder) 07/06/2015   Fibromyalgia 07/06/2015   Encounter for long-term (current) use of other medications 07/26/2014   Diabetes ([place]) 04/08/2014   Migraines 04/08/2014   Gastroduodenal ulcer 04/08/2014   Venous stasis 04/08/2014   Osteoarthritis 04/08/2014   Coronary artery disease 04/08/2014   Peptic ulcer disease 04/08/2014   Obesity 04/08/2014   Rheumatoid arthritis ([place]) 04/06/2014   Social History   Tobacco Use   Smoking status: Former    Packs/day: 1.00    Years: 29.00    Pack years: 29.00    Types: Cigarettes    Quit date: 12/03/1996    Years since quitting: 24.6   Smokeless tobacco: Never  Vaping Use   Vaping Use: Never used  Substance Use Topics   Alcohol use: No   Drug use: No   Allergies  Allergen Reactions   Sulfa Antibiotics Itching, Rash and Swelling   Latex Itching    itching   Doxycycline Rash   Empagliflozin Itching and Other (See Comments)    Itching and frequent yeast infections       Medications: Outpatient Medications Prior to Visit  Medication Sig   buPROPion (WELLBUTRIN XL) 300 MG 24 hr tablet Take 1 tablet (300 mg total) by mouth daily.   carvedilol (COREG) 6.25 MG tablet Take 1 tablet (6.25 mg total) by mouth 2 (two) times daily with a meal.   clonazePAM (KLONOPIN) 0.5 MG tablet Take 0.5-1 tablets (0.25-0.5 mg  total) by mouth 2 (two) times daily as needed for anxiety.   DULoxetine (CYMBALTA) 60 MG capsule TAKE 1 CAPSULE BY MOUTH EVERY DAY   ezetimibe (ZETIA) 10 MG tablet Take 1 tablet (10 mg total) by mouth daily.   losartan (COZAAR) 25 MG tablet Take 1 tablet (25 mg total) by mouth daily.   pregabalin (LYRICA) 75 MG capsule TAKE 1 CAPSULE BY MOUTH 3 TIMES DAILY.   tolterodine (DETROL LA) 4 MG 24 hr capsule Take 1 capsule (4 mg total) by mouth daily.   Vibegron (GEMTESA) 75 MG TABS Take 75 mg by mouth daily.   [DISCONTINUED] clotrimazole-betamethasone (LOTRISONE) cream Apply 1 application topically 2 (two) times daily.   [DISCONTINUED] metFORMIN (GLUCOPHAGE) 500 MG tablet Take 1 tablet (500 mg total) by mouth 2 (two) times daily with a meal.   [DISCONTINUED] pregabalin (LYRICA) 75 MG capsule Take by mouth.   No facility-administered medications prior to visit.    Review of Systems  Constitutional:  Negative for chills, fatigue and fever.  HENT:  Negative for ear pain, sinus pressure, sinus pain and sore throat.   Eyes:  Negative for pain and visual disturbance.  Respiratory:  Negative for cough, chest tightness, shortness of breath and wheezing.   Cardiovascular:  Negative for chest pain, palpitations and leg swelling.  Gastrointestinal:  Negative for abdominal pain, blood in stool, diarrhea, nausea and vomiting.       Objective  -------------------------------------------------------------------------------------------------------------------- BP 138/73 (BP Location: Left Arm, Patient Position: Sitting, Cuff Size: Large)   Pulse 86   Temp 98 F (36.7 C) (Oral)   Resp 16   Ht '5\' 7"'$  (1.702 m)   Wt 237 lb 9.6 oz (107.8 kg)   LMP  (LMP Unknown) Comment: age 26  BMI 37.21 kg/m  BP Readings from Last 3 Encounters:  07/18/21 138/73  06/19/21 (!) 173/107  04/17/21 (!) 156/72   Wt Readings from Last 3 Encounters:  07/18/21 237 lb 9.6 oz (107.8 kg)  04/17/21 224 lb 11.2 oz (101.9 kg)   03/07/21 227 lb 11.2 oz (103.3 kg)       Physical Exam Vitals reviewed.  Constitutional:      General: She is not in acute distress.    Appearance: Normal appearance. She is well-developed. She is not diaphoretic.  HENT:     Head: Normocephalic and atraumatic.  Eyes:     General: No scleral icterus.    Conjunctiva/sclera: Conjunctivae normal.  Neck:     Thyroid: No thyromegaly.  Cardiovascular:     Rate and Rhythm: Normal rate and regular rhythm.     Pulses: Normal pulses.     Heart sounds: Normal heart sounds. No murmur heard. Pulmonary:     Effort: Pulmonary effort is normal. No respiratory distress.     Breath sounds: Normal breath sounds. No wheezing, rhonchi or rales.  Musculoskeletal:     Cervical back: Neck supple.     Right lower leg: No edema.     Left lower leg: No edema.  Lymphadenopathy:     Cervical: No cervical adenopathy.  Skin:    General: Skin is warm and dry.     Findings: Bruising present. No rash.     Comments: Senile purpura   Neurological:     Mental Status: She is alert and oriented to person, place, and time. Mental status is at baseline.  Psychiatric:        Mood and Affect: Mood normal.        Behavior: Behavior normal.     Results for orders placed or performed in visit on 07/18/21  POCT glycosylated hemoglobin (Hb A1C)  Result Value Ref Range   Hemoglobin A1C 7.8 (A) 4.0 - 5.6 %   Est. average glucose Bld gHb Est-mCnc 177     Assessment & Plan  ---------------------------------------------------------------------------------------------------------------------- Problem List Items Addressed This Visit       Cardiovascular and Mediastinum   Hypertension associated with diabetes ([place])    Well controlled Continue current medications Reviewed recent metabolic panel      Senile purpura ([place])    Chronic and stable        Endocrine   Diabetes ([place]) - Primary    Fairly well controlled No longer on meds - prefers lifestyle  management A1c goal <8 Associated with HTN and HLD      Relevant Orders   POCT glycosylated hemoglobin (Hb A1C) (Completed)   Hyperlipidemia associated with type 2 diabetes mellitus ([place])    Uncontrolled Recheck at next visit Continue zetia Unable to tolerate statins due to myalgias        Other   Fibromyalgia    Worsening as she has not been taking lyrica recently Will resume       Return in about 3 months (around 10/18/2021) for chronic disease f/u.  I,Essence Turner,acting as a Education administrator for Lavon Paganini, MD.,have documented all relevant documentation on the behalf of Lavon Paganini, MD,as directed by  Lavon Paganini, MD while in the presence of Lavon Paganini, MD.  I, Lavon Paganini, MD, have reviewed all documentation for this visit. The documentation on 07/18/21 for the exam, diagnosis, procedures, and orders are all accurate and complete.   Izsak Meir, Dionne Bucy, MD, MPH [facility]

## 2021-07-18 NOTE — Progress Notes (Unsigned)
Patient ID: [name] [name], female    DOB: 20-Feb-1951, 70 y.o.   MRN: [id number]  No chief complaint on file.   No diagnosis found.   History of Present Illness: [name] [name] is a 70 y.o.  female  with a history of panniculitis.  She presents for preoperative evaluation for upcoming procedure, panniculectomy, scheduled for 08/09/2021 with Dr. Marla Roe.  The patient {HAS HAS SWH:[id number]} had problems with anesthesia. ***  Summary of Previous Visit: ***  Job: ***  PMH Significant for: Morbid obesity, rheumatoid arthritis, CHF, coronary artery disease, diabetes mellitus, fibromyalgia, hyperlipidemia, hypertension.  History of MI in 2016.  Chronic venous insufficiency.  Most recent A1c 7.9 - 3 months ago   Past Medical History: Allergies: Allergies  Allergen Reactions   Sulfa Antibiotics Itching, Rash and Swelling   Latex Itching    itching   Doxycycline Rash   Empagliflozin Itching and Other (See Comments)    Itching and frequent yeast infections    Current Medications:  Current Outpatient Medications:    buPROPion (WELLBUTRIN XL) 300 MG 24 hr tablet, Take 1 tablet (300 mg total) by mouth daily., Disp: 30 tablet, Rfl: 3   carvedilol (COREG) 6.25 MG tablet, Take 1 tablet (6.25 mg total) by mouth 2 (two) times daily with a meal., Disp: 60 tablet, Rfl: 3   clonazePAM (KLONOPIN) 0.5 MG tablet, Take 0.5-1 tablets (0.25-0.5 mg total) by mouth 2 (two) times daily as needed for anxiety., Disp: 30 tablet, Rfl: 0   clotrimazole-betamethasone (LOTRISONE) cream, Apply 1 application topically 2 (two) times daily., Disp: 90 g, Rfl: 5   DULoxetine (CYMBALTA) 60 MG capsule, TAKE 1 CAPSULE BY MOUTH EVERY DAY, Disp: 90 capsule, Rfl: 0   ezetimibe (ZETIA) 10 MG tablet, Take 1 tablet (10 mg total) by mouth daily., Disp: 90 tablet, Rfl: 3   losartan (COZAAR) 25 MG tablet, Take 1 tablet (25 mg total) by mouth daily., Disp: 90 tablet, Rfl: 1   metFORMIN (GLUCOPHAGE) 500 MG tablet, Take 1 tablet  (500 mg total) by mouth 2 (two) times daily with a meal., Disp: 180 tablet, Rfl: 1   pregabalin (LYRICA) 75 MG capsule, TAKE 1 CAPSULE BY MOUTH 3 TIMES DAILY., Disp: 90 capsule, Rfl: 1   pregabalin (LYRICA) 75 MG capsule, Take by mouth., Disp: , Rfl:    tolterodine (DETROL LA) 4 MG 24 hr capsule, Take 1 capsule (4 mg total) by mouth daily., Disp: 90 capsule, Rfl: 3   Vibegron (GEMTESA) 75 MG TABS, Take 75 mg by mouth daily., Disp: 90 tablet, Rfl: 3  Past Medical Problems: Past Medical History:  Diagnosis Date   Arthritis    Rhumetoid arthritis   BRCA negative 07/2017   MyRisk neg   Cancer ([place])    uterine   Cellulitis of both lower extremities    Chronic   CHF (congestive heart failure) (HCC)    Coronary artery disease    Diabetes mellitus without complication ([place])    Family history of breast cancer 07/2017   MyRisk neg; IBIS=10.5%/riskscore=17%   Family history of ovarian cancer    Fibromyalgia    Fibromyalgia affecting shoulder region    Hyperlipidemia    Hypertension    Spinal stenosis     Past Surgical History: Past Surgical History:  Procedure Laterality Date   ABLATION SAPHENOUS VEIN W/ RFA     ANAL FISSURECTOMY  01/2004   CORONARY ANGIOPLASTY     CYSTOSCOPY  09/03/2017   Procedure: CYSTOSCOPY;  Surgeon:  Gae Dry, MD;  Location: [facility];  Service: Gynecology;;   DILATION AND CURETTAGE OF UTERUS     HERNIA REPAIR     LAPAROSCOPIC HYSTERECTOMY N/A 09/03/2017   Procedure: HYSTERECTOMY TOTAL LAPAROSCOPIC BSO;  Surgeon: Gae Dry, MD;  Location: [facility];  Service: Gynecology;  Laterality: N/A;   MEDIAL PARTIAL KNEE REPLACEMENT Left 85/01/7740   UMBILICAL HERNIA REPAIR N/A 09/05/2015   Procedure: HERNIA REPAIR incarcerated UMBILICAL ADULT;  Surgeon: Sherri Rad, MD;  Location: [facility] OR[facility];  Service: General;  Laterality: N/A;   Vascular Stent  11/04/2013    Social History: Social History   Socioeconomic History   Marital status: Widowed    Spouse name:  Shanon Brow   Number of children: 1   Years of education: 9th Grade   Highest education level: 9th grade  Occupational History   Occupation: Disabled    Comment: no longer receiving   Occupation: on SS  Tobacco Use   Smoking status: Former    Packs/day: 1.00    Years: 29.00    Pack years: 29.00    Types: Cigarettes    Quit date: 12/03/1996    Years since quitting: 24.6   Smokeless tobacco: Never  Vaping Use   Vaping Use: Never used  Substance and Sexual Activity   Alcohol use: No   Drug use: No   Sexual activity: Not Currently  Other Topics Concern   Not on file  Social History Narrative   Not on file   Social Determinants of Health   Financial Resource Strain: Low Risk    Difficulty of Paying Living Expenses: Not hard at all  Food Insecurity: No Food Insecurity   Worried About Charity fundraiser in the Last Year: Never true   [facility] in the Last Year: Never true  Transportation Needs: No Transportation Needs   Lack of Transportation (Medical): No   Lack of Transportation (Non-Medical): No  Physical Activity: Sufficiently Active   Days of Exercise per Week: 7 days   Minutes of Exercise per Session: 30 min  Stress: Stress Concern Present   Feeling of Stress : Rather much  Social Connections: Socially Isolated   Frequency of Communication with Friends and Family: More than three times a week   Frequency of Social Gatherings with Friends and Family: More than three times a week   Attends Religious Services: Never   Marine scientist or Organizations: No   Attends Archivist Meetings: Never   Marital Status: Widowed  Human resources officer Violence: Not At Risk   Fear of Current or Ex-Partner: No   Emotionally Abused: No   Physically Abused: No   Sexually Abused: No    Family History: Family History  Problem Relation Age of Onset   Hypertension Mother    Cataracts Mother    Thyroid disease Mother    Dementia Mother    COPD Father    Dementia  Father    Cataracts Father    Aneurysm Father        Abdominal & Brain   Diabetes Father    Melanoma Father    Breast cancer Sister 55   Fibromyalgia Sister    Migraines Sister    Hypertension Sister    Breast cancer Sister 25   COPD Sister    Ovarian cancer Sister 37   Migraines Brother    Heart attack Brother    Colon cancer Paternal Uncle        23s  Colon cancer Paternal Uncle        57s   Uterine cancer Cousin    Uterine cancer Cousin     Review of Systems: ROS  Physical Exam: Vital Signs LMP  (LMP Unknown) Comment: age 47  Physical Exam *** Constitutional:      General: Not in acute distress.    Appearance: Normal appearance. Not ill-appearing.  HENT:     Head: Normocephalic and atraumatic.  Eyes:     Pupils: Pupils are equal, round Neck:     Musculoskeletal: Normal range of motion.  Cardiovascular:     Rate and Rhythm: Normal rate    Pulses: Normal pulses.  Pulmonary:     Effort: Pulmonary effort is normal. No respiratory distress.  Abdominal:     General: Abdomen is flat. There is no distension.  Musculoskeletal: Normal range of motion.  Skin:    General: Skin is warm and dry.     Findings: No erythema or rash.  Neurological:     General: No focal deficit present.     Mental Status: Alert and oriented to person, place, and time. Mental status is at baseline.     Motor: No weakness.  Psychiatric:        Mood and Affect: Mood normal.        Behavior: Behavior normal.    Assessment/Plan: The patient is scheduled for panniculectomy with Dr. Marla Roe.  Risks, benefits, and alternatives of procedure discussed, questions answered and consent obtained.    Smoking Status: ***; Counseling Given? ***  Caprini Score: ***; Risk Factors include: ***, BMI *** 25, and length of planned surgery. Recommendation for mechanical *** prophylaxis. Encourage early ambulation.   Pictures obtained: _0   Post-op Rx sent to pharmacy: Norco, Zofran,  Keflex  Patient was provided with the General Surgical Risk consent document and Pain Medication Agreement prior to their appointment.  They had adequate time to read through the risk consent documents and Pain Medication Agreement. We also discussed them in person together during this preop appointment. All of their questions were answered to their satisfaction.  Recommended calling if they have any further questions.  Risk consent form and Pain Medication Agreement to be scanned into patient's chart.  The risk that can be encountered for this procedure were discussed and include the following but not limited to these: asymmetry, fluid accumulation, firmness of the tissue, skin loss, decrease or no sensation, fat necrosis, bleeding, infection, healing delay.  Deep vein thrombosis, cardiac and pulmonary complications are risks to any procedure.  There are risks of anesthesia, changes to skin sensation and injury to nerves or blood vessels.  The muscle can be temporarily or permanently injured.  You may have an allergic reaction to tape, suture, glue, blood products which can result in skin discoloration, swelling, pain, skin lesions, poor healing.  Any of these can lead to the need for revisonal surgery or stage procedures.  Weight gain and weigh loss can also effect the long term appearance. The results are not guaranteed to last a lifetime.  Future surgery may be required.      Electronically signed by: Carola Rhine Wynetta Seith, PA-C 07/18/2021 10:13 AM

## 2021-07-18 NOTE — Assessment & Plan Note (Signed)
Well controlled Continue current medications Reviewed recent metabolic panel 

## 2021-07-18 NOTE — Assessment & Plan Note (Signed)
Fairly well controlled No longer on meds - prefers lifestyle management A1c goal <8 Associated with HTN and HLD

## 2021-07-24 ENCOUNTER — Other Ambulatory Visit: Payer: Self-pay | Admitting: Family Medicine

## 2021-07-24 NOTE — Telephone Encounter (Signed)
Medication: pregabalin (LYRICA) 75 MG capsule Has the pt contacted their pharmacy? No Pt states she had appt last week with Dr B and forgot to tell her she needs this refilled.  Preferred pharmacy: [id number]-[name] [name] Please be advised refills may take up to 3 business days.  We ask that you follow up with your pharmacy.

## 2021-07-24 NOTE — Telephone Encounter (Signed)
Requested medication (s) are due for refill today - yes  Requested medication (s) are on the active medication list -yes  Future visit scheduled -yes  Last refill: 01/06/21 #90 1RF  Notes to clinic: Request RF- non delegated Rx  Requested Prescriptions  Pending Prescriptions Disp Refills   pregabalin (LYRICA) 75 MG capsule 90 capsule 1    Sig: Take 1 capsule (75 mg total) by mouth 3 (three) times daily.     Not Delegated - Neurology:  Anticonvulsants - Controlled Failed - 07/24/2021 12:12 PM      Failed - This refill cannot be delegated      Passed - Valid encounter within last 12 months    Recent Outpatient Visits           6 days ago Type 2 diabetes mellitus with diabetic polyneuropathy, without long-term current use of insulin ([place])   [facility] [place], Dionne Bucy, MD   3 months ago Hypertension associated with diabetes [facility])   [facility], Dionne Bucy, MD   4 months ago Housing instability   [facility], Dionne Bucy, MD   7 months ago Type 2 diabetes mellitus with diabetic polyneuropathy, without long-term current use of insulin [facility])   [facility], Dionne Bucy, MD   7 months ago Weight loss   [facility], Dionne Bucy, MD       Future Appointments             In 2 months MacDiarmid, Nicki Reaper, MD [place]   In 2 months Bacigal[place], Dionne Bucy, MD [facility]               Requested Prescriptions  Pending Prescriptions Disp Refills   pregabalin (LYRICA) 75 MG capsule 90 capsule 1    Sig: Take 1 capsule (75 mg total) by mouth 3 (three) times daily.     Not Delegated - Neurology:  Anticonvulsants - Controlled Failed - 07/24/2021 12:12 PM      Failed - This refill cannot be delegated      Passed - Valid encounter within last 12 months    Recent Outpatient Visits           6 days ago Type 2 diabetes mellitus with  diabetic polyneuropathy, without long-term current use of insulin [facility])   [facility] [place], Dionne Bucy, MD   3 months ago Hypertension associated with diabetes [facility])   [facility], Dionne Bucy, MD   4 months ago Housing instability   [place], Dionne Bucy, MD   7 months ago Type 2 diabetes mellitus with diabetic polyneuropathy, without long-term current use of insulin [facility])   [facility], Dionne Bucy, MD   7 months ago Weight loss   [facility], Dionne Bucy, MD       Future Appointments             In 2 months MacDiarmid, Nicki Reaper, MD [place]   In 2 months Bacigalupo, Dionne Bucy, MD [facility] [place]

## 2021-07-25 MED ORDER — PREGABALIN 75 MG PO CAPS: 75.0000 mg | ORAL_CAPSULE | Freq: Three times a day (TID) | ORAL | 1 refills | Status: DC

## 2021-07-28 NOTE — Progress Notes (Signed)
Surgical Instructions    Your procedure is scheduled on Wednesday September 7th.  Report to [facility] Main Entrance "A" at 9:30 A.M., then check in with the Admitting office.  Call this number if you have problems the morning of surgery:  [phone]   If you have any questions prior to your surgery date call [phone]: Open Monday-Friday 8am-4pm    Remember:  Do not eat after midnight the night before your surgery  You may drink clear liquids until 8:30 am the morning of your surgery.   Clear liquids allowed are: Water, Non-Citrus Juices (without pulp), Carbonated Beverages, Clear Tea, Black Coffee ONLY (NO MILK, CREAM OR POWDERED CREAMER of any kind), and Gatorade    Take these medicines the morning of surgery with A SIP OF WATER buPROPion (WELLBUTRIN XL) 300 MG 24 hr tablet carvedilol (COREG) 6.25 MG tablet DULoxetine (CYMBALTA) 60 MG capsule ezetimibe (ZETIA) 10 MG tablet pregabalin (LYRICA) 75 MG capsule tolterodine (DETROL LA) 4 MG 24 hr capsule Vibegron (GEMTESA) 75 MG TABS  IF NEEDED clonazePAM (KLONOPIN) 0.5 MG tablet   As of today, STOP taking any Aspirin (unless otherwise instructed by your surgeon) Aleve, Naproxen, Ibuprofen, Motrin, Advil, Goody's, BC's, all herbal medications, fish oil, and all vitamins.          Do not wear jewelry or makeup Do not wear lotions, powders, perfumes/colognes, or deodorant. Do not shave 48 hours prior to surgery.  Men may shave face and neck. Do not bring valuables to the hospital. DO Not wear nail polish, gel polish, artificial nails, or any other type of covering on  natural nails including finger and toenails. If patients have artificial nails, gel coating, etc. that need to be removed by a nail salon please have this removed prior to surgery or surgery may need to be canceled/delayed if the surgeon/ anesthesia feels like the patient is unable to be adequately monitored.             [facility] is not responsible for any  belongings or valuables.  Do NOT Smoke (Tobacco/Vaping) or drink Alcohol 24 hours prior to your procedure If you use a CPAP at night, you may bring all equipment for your overnight stay.   Contacts, glasses, dentures or bridgework may not be worn into surgery, please bring cases for these belongings   For patients admitted to the hospital, discharge time will be determined by your treatment team.   Patients discharged the day of surgery will not be allowed to drive home, and someone needs to stay with them for 24 hours.  ONLY 1 SUPPORT PERSON MAY BE PRESENT WHILE YOU ARE IN SURGERY. IF YOU ARE TO BE ADMITTED ONCE YOU ARE IN YOUR ROOM YOU WILL BE ALLOWED TWO (2) VISITORS.  Minor children may have two parents present. Special consideration for safety and communication needs will be reviewed on a case by case basis.  Special instructions:    Oral Hygiene is also important to reduce your risk of infection.  Remember - BRUSH YOUR TEETH THE MORNING OF SURGERY WITH YOUR REGULAR TOOTHPASTE   [facility]- Preparing For Surgery  Before surgery, you can play an important role. Because skin is not sterile, your skin needs to be as free of germs as possible. You can reduce the number of germs on your skin by washing with CHG (chlorahexidine gluconate) Soap before surgery.  CHG is an antiseptic cleaner which kills germs and bonds with the skin to continue killing germs even after washing.  Please do not use if you have an allergy to CHG or antibacterial soaps. If your skin becomes reddened/irritated stop using the CHG.  Do not shave (including legs and underarms) for at least 48 hours prior to first CHG shower. It is OK to shave your face.  Please follow these instructions carefully.     Shower the NIGHT BEFORE SURGERY and the MORNING OF SURGERY with CHG Soap.   If you chose to wash your hair, wash your hair first as usual with your normal shampoo. After you shampoo, rinse your hair and body  thoroughly to remove the shampoo.  Then [employer] and genitals (private parts) with your normal soap and rinse thoroughly to remove soap.  After that Use CHG Soap as you would any other liquid soap. You can apply CHG directly to the skin and wash gently with a scrungie or a clean washcloth.   Apply the CHG Soap to your body ONLY FROM THE NECK DOWN.  Do not use on open wounds or open sores. Avoid contact with your eyes, ears, mouth and genitals (private parts). Wash Face and genitals (private parts)  with your normal soap.   Wash thoroughly, paying special attention to the area where your surgery will be performed.  Thoroughly rinse your body with warm water from the neck down.  DO NOT shower/wash with your normal soap after using and rinsing off the CHG Soap.  Pat yourself dry with a CLEAN TOWEL.  Wear CLEAN PAJAMAS to bed the night before surgery  Place CLEAN SHEETS on your bed the night before your surgery  DO NOT SLEEP WITH PETS.   Day of Surgery:  Take a shower with CHG soap. Wear Clean/Comfortable clothing the morning of surgery Do not apply any deodorants/lotions.   Remember to brush your teeth WITH YOUR REGULAR TOOTHPASTE.   Please read over the following fact sheets that you were given.

## 2021-07-31 ENCOUNTER — Other Ambulatory Visit: Payer: Self-pay

## 2021-07-31 ENCOUNTER — Encounter (HOSPITAL_COMMUNITY)
Admission: RE | Admit: 2021-07-31 | Discharge: 2021-07-31 | Disposition: A | Discharge status: Home / Self Care | Payer: Medicare Other | Source: Ambulatory Visit | Attending: Plastic Surgery | Admitting: Plastic Surgery

## 2021-07-31 ENCOUNTER — Encounter (HOSPITAL_COMMUNITY): Payer: Self-pay

## 2021-07-31 ENCOUNTER — Encounter (HOSPITAL_COMMUNITY): Payer: Self-pay | Admitting: Physician Assistant

## 2021-07-31 DIAGNOSIS — Z01812 Encounter for preprocedural laboratory examination: Secondary | ICD-10-CM | POA: Insufficient documentation

## 2021-07-31 DIAGNOSIS — I110 Hypertensive heart disease with heart failure: Secondary | ICD-10-CM | POA: Diagnosis not present

## 2021-07-31 DIAGNOSIS — R7309 Other abnormal glucose: Secondary | ICD-10-CM | POA: Insufficient documentation

## 2021-07-31 DIAGNOSIS — I340 Nonrheumatic mitral (valve) insufficiency: Secondary | ICD-10-CM | POA: Insufficient documentation

## 2021-07-31 DIAGNOSIS — I5020 Unspecified systolic (congestive) heart failure: Secondary | ICD-10-CM | POA: Insufficient documentation

## 2021-07-31 DIAGNOSIS — Z79899 Other long term (current) drug therapy: Secondary | ICD-10-CM | POA: Diagnosis not present

## 2021-07-31 HISTORY — DX: Anxiety disorder, unspecified: F41.9

## 2021-07-31 HISTORY — DX: Depression, unspecified: F32.A

## 2021-07-31 LAB — CBC
HCT: 41.7 % (ref 36.0–46.0)
Hemoglobin: 13.4 g/dL (ref 12.0–15.0)
MCH: 26.7 pg (ref 26.0–34.0)
MCHC: 32.1 g/dL (ref 30.0–36.0)
MCV: 83.2 fL (ref 80.0–100.0)
Platelets: 277 10*3/uL (ref 150–400)
RBC: 5.01 MIL/uL (ref 3.87–5.11)
RDW: 14.6 % (ref 11.5–15.5)
WBC: 6.8 10*3/uL (ref 4.0–10.5)
nRBC: 0 % (ref 0.0–0.2)

## 2021-07-31 LAB — BASIC METABOLIC PANEL
Anion gap: 11 (ref 5–15)
BUN: 14 mg/dL (ref 8–23)
CO2: 25 mmol/L (ref 22–32)
Calcium: 9.6 mg/dL (ref 8.9–10.3)
Chloride: 103 mmol/L (ref 98–111)
Creatinine, Ser: 0.85 mg/dL (ref 0.44–1.00)
GFR, Estimated: >60 mL/min (ref >60)
Glucose, Bld: 161 mg/dL — ABNORMAL HIGH (ref 70–99)
Potassium: 4.3 mmol/L (ref 3.5–5.1)
Sodium: 139 mmol/L (ref 135–145)

## 2021-07-31 NOTE — Progress Notes (Addendum)
Surgical Instructions    Your procedure is scheduled on Wednesday September 7th.  Report to [facility] Main Entrance "A" at 9:30 A.M., then check in with the Admitting office.  Call this number if you have problems the morning of surgery:  [phone]   If you have any questions prior to your surgery date call [phone]: Open Monday-Friday 8am-4pm    Remember:  Do not eat or drink after midnight the night before your surgery     Take these medicines the morning of surgery with A SIP OF WATER buPROPion (WELLBUTRIN XL) 300 MG 24 hr tablet carvedilol (COREG) 6.25 MG tablet DULoxetine (CYMBALTA) 60 MG capsule ezetimibe (ZETIA) 10 MG tablet pregabalin (LYRICA) 75 MG capsule tolterodine (DETROL LA) 4 MG 24 hr capsule Vibegron (GEMTESA) 75 MG TABS  IF NEEDED clonazePAM (KLONOPIN) 0.5 MG tablet   As of today, STOP taking any Aspirin (unless otherwise instructed by your surgeon) Aleve, Naproxen, Ibuprofen, Motrin, Advil, Goody's, BC's, all herbal medications, fish oil, and all vitamins.          HOW TO MANAGE YOUR DIABETES BEFORE AND AFTER SURGERY  Why is it important to control my blood sugar before and after surgery? Improving blood sugar levels before and after surgery helps healing and can limit problems. A way of improving blood sugar control is eating a healthy diet by:  Eating less sugar and carbohydrates  Increasing activity/exercise  Talking with your doctor about reaching your blood sugar goals High blood sugars (greater than 180 mg/dL) can raise your risk of infections and slow your recovery, so you will need to focus on controlling your diabetes during the weeks before surgery. Make sure that the doctor who takes care of your diabetes knows about your planned surgery including the date and location.  How do I manage my blood sugar before surgery? Check your blood sugar at least 4 times a day, starting 2 days before surgery, to make sure that the level is not too  high or low.  Check your blood sugar the morning of your surgery when you wake up and every 2 hours until you get to the Short Stay unit.  If your blood sugar is less than 70 mg/dL, you will need to treat for low blood sugar: Do not take insulin. Treat a low blood sugar (less than 70 mg/dL) with  cup of clear juice (cranberry or apple), 4 glucose tablets, OR glucose gel. Recheck blood sugar in 15 minutes after treatment (to make sure it is greater than 70 mg/dL). If your blood sugar is not greater than 70 mg/dL on recheck, call [phone] for further instructions. Report your blood sugar to the short stay nurse when you get to Short Stay.  If you are admitted to the hospital after surgery: Your blood sugar will be checked by the staff and you will probably be given insulin after surgery (instead of oral diabetes medicines) to make sure you have good blood sugar levels. The goal for blood sugar control after surgery is 80-180 mg/dL.                DAY OF SURGERY: Do not wear jewelry, makeup, or nail polish Do not wear lotions, powders, perfumes, or deodorant. Do not shave 48 hours prior to surgery.   Do not bring valuables to the hospital.             [facility] is not responsible for any belongings or valuables.  Do NOT Smoke (Tobacco/Vaping) or drink Alcohol  24 hours prior to your procedure If you use a CPAP at night, you may bring all equipment for your overnight stay.   Contacts, glasses, dentures or bridgework may not be worn into surgery, please bring cases for these belongings   For patients admitted to the hospital, discharge time will be determined by your treatment team.   Patients discharged the day of surgery will not be allowed to drive home, and someone needs to stay with them for 24 hours.  ONLY 1 SUPPORT PERSON MAY BE PRESENT WHILE YOU ARE IN SURGERY. IF YOU ARE TO BE ADMITTED ONCE YOU ARE IN YOUR ROOM YOU WILL BE ALLOWED TWO (2) VISITORS.  Minor children may have  two parents present. Special consideration for safety and communication needs will be reviewed on a case by case basis.  Special instructions:    Oral Hygiene is also important to reduce your risk of infection.  Remember - BRUSH YOUR TEETH THE MORNING OF SURGERY WITH YOUR REGULAR TOOTHPASTE   [place]- Preparing For Surgery  Before surgery, you can play an important role. Because skin is not sterile, your skin needs to be as free of germs as possible. You can reduce the number of germs on your skin by washing with CHG (chlorahexidine gluconate) Soap before surgery.  CHG is an antiseptic cleaner which kills germs and bonds with the skin to continue killing germs even after washing.     Please do not use if you have an allergy to CHG or antibacterial soaps. If your skin becomes reddened/irritated stop using the CHG.  Do not shave (including legs and underarms) for at least 48 hours prior to first CHG shower. It is OK to shave your face.  Please follow these instructions carefully.     Shower the NIGHT BEFORE SURGERY and the MORNING OF SURGERY with CHG Soap.   If you chose to wash your hair, wash your hair first as usual with your normal shampoo. After you shampoo, rinse your hair and body thoroughly to remove the shampoo.  Then [employer] and genitals (private parts) with your normal soap and rinse thoroughly to remove soap.  After that Use CHG Soap as you would any other liquid soap. You can apply CHG directly to the skin and wash gently with a scrungie or a clean washcloth.   Apply the CHG Soap to your body ONLY FROM THE NECK DOWN.  Do not use on open wounds or open sores. Avoid contact with your eyes, ears, mouth and genitals (private parts). Wash Face and genitals (private parts)  with your normal soap.   Wash thoroughly, paying special attention to the area where your surgery will be performed.  Thoroughly rinse your body with warm water from the neck down.  DO NOT shower/wash with  your normal soap after using and rinsing off the CHG Soap.  Pat yourself dry with a CLEAN TOWEL.  Wear CLEAN PAJAMAS to bed the night before surgery  Place CLEAN SHEETS on your bed the night before your surgery  DO NOT SLEEP WITH PETS.   Day of Surgery:  Take a shower with CHG soap. Wear Clean/Comfortable clothing the morning of surgery Do not apply any deodorants/lotions.   Remember to brush your teeth WITH YOUR REGULAR TOOTHPASTE.   Please read over the following fact sheets that you were given.

## 2021-07-31 NOTE — Progress Notes (Signed)
PCP - Lavon Paganini Cardiologist - denies  Chest x-ray - n/a EKG - 11/07/20 Stress Test - 2018 ECHO - 2016 Cardiac Cath - over 10 years ago  DM - Type 2 Fasting Blood Sugar - does not check sugars at home (pt no longer taking diabetic medications)   COVID TEST- n/a (ambulatory surgery)   Anesthesia review: yes, heart history & elevated BP at PAT appointment 189/129, 188/115, 187/105 - Jeneen Rinks aware and is speaking with patient Jeneen Rinks to follow up   Patient denies shortness of breath, fever, cough and chest pain at PAT appointment   All instructions explained to the patient, with a verbal understanding of the material. Patient agrees to go over the instructions while at home for a better understanding. Patient also instructed to self quarantine after being tested for COVID-19. The opportunity to ask questions was provided.

## 2021-08-01 NOTE — Progress Notes (Signed)
Anesthesia Chart Review:  I spoke with patient at preadmission testing appointment due to elevated blood pressure and history of dilated cardiomyopathy and HFrEF.  Echocardiogram October 2016 showed EF 25 to 30%, no wall motion, mild MR.  Subsequent nuclear stress September 2018 showed medium defect of moderate severity in the apical anterior location consistent with prior MI and peri-infarct ischemia, EF 30 to 44%.  It was read as intermediate risk study.  Blood pressure on arrival to prevention testing was 189/129 and on recheck 189/105.  Patient admits to significant anxiety about upcoming surgery.  She has had a roughly 160 pound intentional weight loss over the past 2 years and interestingly she reports she did not develop hypertension until after weight loss.  She is on losartan and carvedilol reports compliance with medications.  She denies any chest pain or shortness of breath.  She says she feels better now that she has in a number of years.  She exercises regularly and can go up 2 flights of stairs without chest pain or dyspnea.  She reports that her blood pressures generally much better controlled at home and she monitors it consistently.  She is followed closely by her PCP Dr.Bacigalupo, however she unfortunately has not followed up with cardiology since 2018.  We discussed that she should have cardiology evaluation prior to undergoing elective surgery.  She verbalized understanding.   Pt subsequently seen by cardiologist at [facility] and preop echo was recommended. This is in the process of being scheduled.   Case ha been postponed by plastic surgery due to preop A1c being >7.5 (7.8 on 07/18/21).   Remainder of preop labs reviewed, unremarkable.  Chart will be reviewed again once pt has met necessary preop requirements from surgeon and completed cardiac evaluation.    [name] [facility] Short Stay Center/Anesthesiology Phone [phone] 08/03/2021 2:56 PM

## 2021-08-02 ENCOUNTER — Encounter: Payer: Self-pay | Admitting: Surgical

## 2021-08-02 ENCOUNTER — Other Ambulatory Visit: Payer: Self-pay

## 2021-08-02 ENCOUNTER — Ambulatory Visit (INDEPENDENT_AMBULATORY_CARE_PROVIDER_SITE_OTHER): Payer: Medicare Other | Admitting: Surgical

## 2021-08-02 DIAGNOSIS — I10 Essential (primary) hypertension: Secondary | ICD-10-CM | POA: Diagnosis not present

## 2021-08-02 DIAGNOSIS — M5441 Lumbago with sciatica, right side: Secondary | ICD-10-CM

## 2021-08-02 DIAGNOSIS — M5442 Lumbago with sciatica, left side: Secondary | ICD-10-CM

## 2021-08-02 DIAGNOSIS — Z01818 Encounter for other preprocedural examination: Secondary | ICD-10-CM | POA: Diagnosis not present

## 2021-08-02 DIAGNOSIS — M793 Panniculitis, unspecified: Secondary | ICD-10-CM

## 2021-08-02 DIAGNOSIS — I2510 Atherosclerotic heart disease of native coronary artery without angina pectoris: Secondary | ICD-10-CM | POA: Diagnosis not present

## 2021-08-02 DIAGNOSIS — I5022 Chronic systolic (congestive) heart failure: Secondary | ICD-10-CM | POA: Diagnosis not present

## 2021-08-02 DIAGNOSIS — G8929 Other chronic pain: Secondary | ICD-10-CM

## 2021-08-02 DIAGNOSIS — I872 Venous insufficiency (chronic) (peripheral): Secondary | ICD-10-CM

## 2021-08-02 NOTE — Progress Notes (Signed)
Patient ID: [name] [name], female    DOB: [birth year], 70 y.o.   MRN: [id number]  Chief Complaint  Patient presents with   Pre-op Exam      ICD-10-CM   1. Morbid obesity ([place])  E66.01     2. Chronic bilateral low back pain with bilateral sciatica  M54.42    M54.41    G89.29     3. Panniculitis  M79.3     4. Chronic venous insufficiency  I87.2        History of Present Illness: [name] [name] is a 70 y.o.  female  with a history of panniculitis.  She presents for preoperative evaluation for upcoming procedure, panniculectomy, scheduled for 08/09/2021 with Dr. Marla Roe.  The patient has not had problems with anesthesia. No history of DVT/PE.  No family history of DVT/PE.  No family or personal history of bleeding or clotting disorders.  Patient is not currently taking any blood thinners.  No history of CVA/MI.   PMH Significant for: Morbid obesity, rheumatoid arthritis, CHF, coronary artery disease with stent in place, diabetes mellitus, fibromyalgia, hyperlipidemia, hypertension.  Chronic venous insufficiency.  Most recent A1c 7.8 on 07/18/2021.  Patient did recently see cardiology for establishing care and discussing preoperative clearance.  It was recommended patient's start on ASA 81 mg daily, she should also take this medication the day of surgery and throughout the postoperative period.  She should also start taking metoprolol daily and on the day of surgery.  She will need an echocardiogram prior to surgery.**Patient reports she is scheduled for echocardiogram today.  Patient has completed preoperative preadmission testing at the hospital. Patient reports she is feeling well, she reports no recent changes in her health.  She is not having any fevers, chills, nausea, vomiting, chest pain, shortness of breath, dizziness or weakness.   Past Medical History: Allergies: Allergies  Allergen Reactions   Sulfa Antibiotics Itching, Rash and Swelling   Latex Itching    itching    Doxycycline Rash   Empagliflozin Itching and Other (See Comments)    Itching and frequent yeast infections    Current Medications:  Current Outpatient Medications:    acetaminophen (TYLENOL) 325 MG tablet, Take 1 tablet by mouth as needed., Disp: , Rfl:    aspirin 81 MG chewable tablet, Chew by mouth., Disp: , Rfl:    buPROPion (WELLBUTRIN XL) 300 MG 24 hr tablet, Take 1 tablet (300 mg total) by mouth daily., Disp: 30 tablet, Rfl: 3   carvedilol (COREG) 6.25 MG tablet, Take 1 tablet (6.25 mg total) by mouth 2 (two) times daily with a meal., Disp: 60 tablet, Rfl: 3   clonazePAM (KLONOPIN) 0.5 MG tablet, Take 0.5-1 tablets (0.25-0.5 mg total) by mouth 2 (two) times daily as needed for anxiety., Disp: 30 tablet, Rfl: 0   clotrimazole-betamethasone (LOTRISONE) cream, Apply 1 application topically 2 (two) times daily., Disp: , Rfl:    DULoxetine (CYMBALTA) 60 MG capsule, TAKE 1 CAPSULE BY MOUTH EVERY DAY (Patient taking differently: Take 60 mg by mouth daily.), Disp: 90 capsule, Rfl: 0   ezetimibe (ZETIA) 10 MG tablet, Take 1 tablet (10 mg total) by mouth daily., Disp: 90 tablet, Rfl: 3   losartan (COZAAR) 25 MG tablet, Take 1 tablet (25 mg total) by mouth daily., Disp: 90 tablet, Rfl: 1   metoprolol succinate (TOPROL-XL) 25 MG 24 hr tablet, Take 25 mg by mouth daily., Disp: , Rfl:    pregabalin (LYRICA) 75 MG capsule, Take 1  capsule (75 mg total) by mouth 3 (three) times daily., Disp: 90 capsule, Rfl: 1   tolterodine (DETROL LA) 4 MG 24 hr capsule, Take 1 capsule (4 mg total) by mouth daily., Disp: 90 capsule, Rfl: 3   Vibegron (GEMTESA) 75 MG TABS, Take 75 mg by mouth daily., Disp: 90 tablet, Rfl: 3  Past Medical Problems: Past Medical History:  Diagnosis Date   Anxiety    Arthritis    Rhumetoid arthritis   BRCA negative 07/2017   MyRisk neg   Cancer ([place])    uterine   Cellulitis of both lower extremities    Chronic   CHF (congestive heart failure) (HCC)    Coronary artery disease     Depression    Diabetes mellitus without complication ([place])    Family history of breast cancer 07/2017   MyRisk neg; IBIS=10.5%/riskscore=17%   Family history of ovarian cancer    Fibromyalgia    Fibromyalgia affecting shoulder region    Hyperlipidemia    Hypertension    Spinal stenosis     Past Surgical History: Past Surgical History:  Procedure Laterality Date   ABLATION SAPHENOUS VEIN W/ RFA     ANAL FISSURECTOMY  01/2004   CORONARY ANGIOPLASTY     CYSTOSCOPY  09/03/2017   Procedure: CYSTOSCOPY;  Surgeon: Gae Dry, MD;  Location: [facility] ORS;  Service: Gynecology;;   DILATION AND CURETTAGE OF UTERUS     HERNIA REPAIR     LAPAROSCOPIC HYSTERECTOMY N/A 09/03/2017   Procedure: HYSTERECTOMY TOTAL LAPAROSCOPIC BSO;  Surgeon: Gae Dry, MD;  Location: [facility] ORS;  Service: Gynecology;  Laterality: N/A;   MEDIAL PARTIAL KNEE REPLACEMENT Left 24/49/7530   UMBILICAL HERNIA REPAIR N/A 09/05/2015   Procedure: HERNIA REPAIR incarcerated UMBILICAL ADULT;  Surgeon: Sherri Rad, MD;  Location: [facility] ORS;  Service: General;  Laterality: N/A;   Vascular Stent  11/04/2013    Social History: Social History   Socioeconomic History   Marital status: Widowed    Spouse name: Shanon Brow   Number of children: 1   Years of education: 9th Grade   Highest education level: 9th grade  Occupational History   Occupation: Disabled    Comment: no longer receiving   Occupation: on [employer]  Tobacco Use   Smoking status: Former    Packs/day: 1.00    Years: 29.00    Pack years: 29.00    Types: Cigarettes    Quit date: 12/03/1996    Years since quitting: 24.6   Smokeless tobacco: Never  Vaping Use   Vaping Use: Never used  Substance and Sexual Activity   Alcohol use: No   Drug use: No   Sexual activity: Not Currently  Other Topics Concern   Not on file  Social History Narrative   Not on file   Social Determinants of Health   Financial Resource Strain: Low Risk    Difficulty of Paying Living  Expenses: Not hard at all  Food Insecurity: No Food Insecurity   Worried About Charity fundraiser in the Last Year: Never true   [place] in the Last Year: Never true  Transportation Needs: No Transportation Needs   Lack of Transportation (Medical): No   Lack of Transportation (Non-Medical): No  Physical Activity: Sufficiently Active   Days of Exercise per Week: 7 days   Minutes of Exercise per Session: 30 min  Stress: Stress Concern Present   Feeling of Stress : Rather much  Social Connections: Socially Isolated  Frequency of Communication with Friends and Family: More than three times a week   Frequency of Social Gatherings with Friends and Family: More than three times a week   Attends Religious Services: Never   Marine scientist or Organizations: No   Attends Archivist Meetings: Never   Marital Status: Widowed  Human resources officer Violence: Not At Risk   Fear of Current or Ex-Partner: No   Emotionally Abused: No   Physically Abused: No   Sexually Abused: No    Family History: Family History  Problem Relation Age of Onset   Hypertension Mother    Cataracts Mother    Thyroid disease Mother    Dementia Mother    COPD Father    Dementia Father    Cataracts Father    Aneurysm Father        Abdominal & Brain   Diabetes Father    Melanoma Father    Breast cancer Sister 25   Fibromyalgia Sister    Migraines Sister    Hypertension Sister    Breast cancer Sister 101   COPD Sister    Ovarian cancer Sister 32   Migraines Brother    Heart attack Brother    Colon cancer Paternal Uncle        64s   Colon cancer Paternal Uncle        67s   Uterine cancer Cousin    Uterine cancer Cousin     Review of Systems: Review of Systems  Constitutional: Negative.   Respiratory: Negative.    Cardiovascular: Negative.   Gastrointestinal: Negative.   Neurological: Negative.    Physical Exam: Vital Signs BP (!) 174/103 (BP Location: Left Arm, Patient  Position: Sitting, Cuff Size: Normal)   Pulse 93   Ht 5' 7.5" (1.715 m)   Wt 235 lb 6.4 oz (106.8 kg)   LMP  (LMP Unknown) Comment: age 69  SpO2 98%   BMI 36.32 kg/m   Physical Exam Constitutional:      General: Not in acute distress.    Appearance: Normal appearance. Not ill-appearing.  HENT:     Head: Normocephalic and atraumatic.  Eyes:     Pupils: Pupils are equal, round Neck:     Musculoskeletal: Normal range of motion.  Cardiovascular:     Rate and Rhythm: Normal rate    Pulses: Normal pulses.  Pulmonary:     Effort: Pulmonary effort is normal. No respiratory distress.  Abdominal:     General: Patient does not have an umbilicus. Musculoskeletal: Normal range of motion.  Skin:    General: Skin is warm and dry.     Findings: No erythema or rash.  Neurological:     General: No focal deficit present.     Mental Status: Alert and oriented to person, place, and time. Mental status is at baseline.     Motor: No weakness.  Psychiatric:        Mood and Affect: Mood normal.        Behavior: Behavior normal.    Assessment/Plan: The patient is scheduled for panniculectomy with Dr. Marla Roe.  Risks, benefits, and alternatives of procedure discussed, questions answered and consent obtained.    Smoking Status: Non-smoker; Counseling Given?  N/A  Caprini Score: 6, high; Risk Factors include: Age, BMI rater than 25, varicose veins and length of planned surgery. Recommendation for mechanical prophylaxis. Encourage early ambulation.   Pictures obtained: '@consult'   Post-op Rx sent to pharmacy:  No prescription signs at this  time, will send prescription after surgical clearance has been authorized after discussing A1c with surgical team.  Per cardiology, would recommend patient continue aspirin through the perioperative period.  Patient was provided with the General Surgical Risk consent document and Pain Medication Agreement prior to their appointment.  They had adequate time  to read through the risk consent documents and Pain Medication Agreement. We also discussed them in person together during this preop appointment. All of their questions were answered to their satisfaction.  Recommended calling if they have any further questions.  Risk consent form and Pain Medication Agreement to be scanned into patient's chart.  The risk that can be encountered for this procedure were discussed and include the following but not limited to these: asymmetry, fluid accumulation, firmness of the tissue, skin loss, decrease or no sensation, fat necrosis, bleeding, infection, healing delay.  Deep vein thrombosis, cardiac and pulmonary complications are risks to any procedure.  There are risks of anesthesia, changes to skin sensation and injury to nerves or blood vessels.  The muscle can be temporarily or permanently injured.  You may have an allergic reaction to tape, suture, glue, blood products which can result in skin discoloration, swelling, pain, skin lesions, poor healing.  Any of these can lead to the need for revisonal surgery or stage procedures.  Weight gain and weigh loss can also effect the long term appearance. The results are not guaranteed to last a lifetime.  Future surgery may be required.    Patient is aware she has an increased risk of postoperative complications given her K9T is elevated at 7.8.  I discussed with the patient that she is particularly at risk for needing reoperation.  I discussed with the patient that she will have drains postoperatively.  I discussed with the patient that she may have a postoperative incisional wound VAC.   Electronically signed by: Carola Rhine Culver Feighner, PA-C 08/02/2021 2:35 PM

## 2021-08-03 ENCOUNTER — Telehealth: Payer: Self-pay | Admitting: Surgical

## 2021-08-03 NOTE — Telephone Encounter (Signed)
Called patient to discuss her A1c.  Patient did not answer.  Have discussed with surgical scheduling team to also reach out to patient to inform her that we will need to postpone surgery until after her A1c level is 7.5 or less.

## 2021-08-04 ENCOUNTER — Encounter: Payer: Self-pay | Admitting: Physician Assistant

## 2021-08-04 ENCOUNTER — Telehealth: Payer: Self-pay | Admitting: Surgical

## 2021-08-04 NOTE — Progress Notes (Signed)
Surgical clearance form received from Dr. Corky Sox with cardiology for [name] [name] upcoming surgery planned with Dr. Marla Roe.  They recommend patient continue her aspirin and metoprolol throughout the surgical perioperative period.  Patient is medically cleared for surgery.  She is moderate risk.  No medications to hold prior to surgery.

## 2021-08-04 NOTE — Telephone Encounter (Signed)
Attempted to call patient to inform her that we will need to postpone surgery until A1c is less than or equal to 7.5.  Patient did not answer.  No voicemail was left.

## 2021-08-09 ENCOUNTER — Encounter (HOSPITAL_COMMUNITY): Admission: RE | Payer: Self-pay | Source: Ambulatory Visit

## 2021-08-09 ENCOUNTER — Ambulatory Visit (HOSPITAL_COMMUNITY): Admission: RE | Admit: 2021-08-09 | Payer: Medicare Other | Source: Ambulatory Visit | Admitting: Plastic Surgery

## 2021-08-09 SURGERY — PANNICULECTOMY
Anesthesia: General | Site: Abdomen

## 2021-08-10 ENCOUNTER — Telehealth: Payer: Self-pay | Admitting: Family Medicine

## 2021-08-10 NOTE — Telephone Encounter (Signed)
[facility] faxed refill request for the following medications:   NIACIN 500 MG TABLETS   DIHYDROERGOTAMINE MESYLATE SPRAY '4MG'$ /ML  Please advise.

## 2021-08-14 NOTE — Telephone Encounter (Signed)
I have never prescribed these for her and I do not usually Rx these at all.

## 2021-08-14 NOTE — Telephone Encounter (Signed)
Attempted call. Mailbox is full. No number to contact pharmacy.

## 2021-08-17 ENCOUNTER — Other Ambulatory Visit: Payer: Self-pay | Admitting: Family Medicine

## 2021-08-18 ENCOUNTER — Encounter: Payer: Medicare Other | Admitting: Plastic Surgery

## 2021-08-18 NOTE — Telephone Encounter (Signed)
[facility] called to verify we received the Rx request.  I advised Dr B has never filled these before, and we will not be sending this request.  Pt should make appt to discuss with her dr if she would like to pursue this   [phone]

## 2021-08-25 ENCOUNTER — Encounter: Payer: Medicare Other | Admitting: Physician Assistant

## 2021-08-31 ENCOUNTER — Telehealth: Payer: Self-pay | Admitting: Family Medicine

## 2021-08-31 NOTE — Telephone Encounter (Signed)
[facility] faxed refill request for the following medications:   Dihyroergotamine Mesylate Spray (4mg /ml)  Please advise.

## 2021-09-01 NOTE — Telephone Encounter (Signed)
Medication is not in patient's med list.

## 2021-09-05 NOTE — Telephone Encounter (Signed)
Tried calling pt to verify what medicines she needs refills on.    I don't see Dihyroergotamine Mesylate Spray on her list. Is this a new request?  If so she will need an appointment to discuss this treatment option.    Does pt need Niacin for Naproxen refilled?  I don't see Niacin 500mg  but I did find where she took Naproxen 500mg  in the past.     PEC please advise when she calls back.     Thanks,   -Mickel Baas

## 2021-09-05 NOTE — Telephone Encounter (Signed)
*  500, she misspoke

## 2021-09-05 NOTE — Telephone Encounter (Signed)
Nyosin tablets 5000 MG, have also been requested.

## 2021-09-09 ENCOUNTER — Other Ambulatory Visit: Payer: Self-pay | Admitting: Family Medicine

## 2021-09-09 NOTE — Telephone Encounter (Signed)
Requested medication (s) are due for refill today: no  Requested medication (s) are on the active medication list: yes  Last refill:  08/17/21 #30 2 RF  Future visit scheduled: yes  Notes to clinic:  requesting 90 day refills   Requested Prescriptions  Pending Prescriptions Disp Refills   buPROPion (WELLBUTRIN XL) 300 MG 24 hr tablet [Pharmacy Med Name: BUPROPION HCL XL 300 MG TABLET] 90 tablet 1    Sig: TAKE 1 TABLET BY [place]     Psychiatry: Antidepressants - bupropion Failed - 09/09/2021  9:33 AM      Failed - Last BP in normal range    BP Readings from Last 1 Encounters:  08/02/21 (!) 174/103          Passed - Completed PHQ-2 or PHQ-9 in the last 360 days      Passed - Valid encounter within last 6 months    Recent Outpatient Visits           1 month ago Type 2 diabetes mellitus with diabetic polyneuropathy, without long-term current use of insulin ([place])   [facility], Dionne Bucy, MD   4 months ago Hypertension associated with diabetes [facility])   [facility], Dionne Bucy, MD   6 months ago Housing instability   [facility] [place], Dionne Bucy, MD   8 months ago Type 2 diabetes mellitus with diabetic polyneuropathy, without long-term current use of insulin [facility])   [facility], Dionne Bucy, MD   9 months ago Weight loss   [facility], Dionne Bucy, MD       Future Appointments             In 2 weeks MacDiarmid, Nicki Reaper, MD [place]   In 1 month Bacigalupo, Dionne Bucy, MD [facility] [place]

## 2021-09-11 ENCOUNTER — Telehealth: Payer: Self-pay

## 2021-09-11 NOTE — Telephone Encounter (Signed)
We need clarification on what meds she wants refilled. I would assume someone else (Ne[facility] maybe) filled the nasal spray and should continue to fill. Need to know what the second med is (Naproxen? Niacin?)

## 2021-09-11 NOTE — Telephone Encounter (Signed)
Copied from [place] [id number]. Topic: General - Other >> Sep 08, 2021  6:10 PM Pawlus, Brayton Layman A wrote: Reason for CRM: Pt called in and wanted to know if she could be started on clonazePAM (KLONOPIN) 0.5 MG tablet again, please advise.

## 2021-09-11 NOTE — Telephone Encounter (Signed)
Not at this time - would need an appt to discuss what is bringing this up. She could make an appt to discuss this if she likes

## 2021-09-12 ENCOUNTER — Other Ambulatory Visit: Payer: Self-pay

## 2021-09-12 MED ORDER — NAPROXEN 500 MG PO TABS: 500.0000 mg | ORAL_TABLET | Freq: Two times a day (BID) | ORAL | 0 refills | Status: DC

## 2021-09-12 NOTE — Telephone Encounter (Signed)
Patient called and asked about the refill below. She says she would like a refill on Naproxen 500 mg, but she is not on any type of nasal spray, so is not sure where that came from. [facility] [address] in [place].

## 2021-09-12 NOTE — Telephone Encounter (Signed)
Patient advised as below.  

## 2021-09-12 NOTE — Telephone Encounter (Signed)
Voicemail box is full if patient returns call [facility] nurse triage can speak with patient to get clarification in regards to which medications patient needs refilled. KW

## 2021-09-25 ENCOUNTER — Ambulatory Visit: Payer: Medicare Other | Admitting: Urology

## 2021-10-02 ENCOUNTER — Encounter: Payer: Self-pay | Admitting: Urology

## 2021-10-02 ENCOUNTER — Other Ambulatory Visit: Payer: Self-pay

## 2021-10-02 ENCOUNTER — Ambulatory Visit (INDEPENDENT_AMBULATORY_CARE_PROVIDER_SITE_OTHER): Payer: Medicare Other | Admitting: Urology

## 2021-10-02 VITALS — BP 184/99 | HR 82 | Ht 67.0 in | Wt 243.0 lb

## 2021-10-02 DIAGNOSIS — N3946 Mixed incontinence: Secondary | ICD-10-CM | POA: Diagnosis not present

## 2021-10-02 MED ORDER — CIPROFLOXACIN HCL 250 MG PO TABS: 250.0000 mg | ORAL_TABLET | Freq: Two times a day (BID) | ORAL | 0 refills | Status: DC

## 2021-10-02 NOTE — Progress Notes (Signed)
10/02/2021 11:11 AM   [name] [name] 07-Oct-1951 [id number]  Referring provider: Virginia Crews, [place],  [place] [address]  Chief Complaint  Patient presents with   Urinary Incontinence    56mofollow up    HPI: 2019: Patient was consulted to be assessed for worsening urinary incontinence since a hysterectomy in October 2018.  She leaks with coughing sneezing bending lifting a significant amount.  She wears 10-15 pads a day that are soaked.  She has urge incontinence.  She can run water and leak without awareness. She will leak when she goes from a sitting to standing position.  She has foot on the floor syndrome.  She has mild bedwetting but not every night   She voids every 30 to 45 minutes and cannot hold it for 2 hours.  She gets up 5-6 times a night.  She has left ankle edema.   The pelvic examination was a little bit limited by her obesity.  She had grade 2 hypermobility the bladder neck and a negative cough test but her bladder I believe is quite empty.  She had a large suprapubic fat pad   Patient has high-volume mixed incontinence with typical triggers.  She has intermittent milder bedwetting.  She has severe frequency.  She has significant nocturia.  If the patient ever needed a sling she would need a mini sling and not a retropubic    On urodynamics she did not void but her bladder was empty.  Her maximum bladder capacity was 100 mL.  She had sensory urgency.  The maximum detrusor pressure was 38 cm water occurring at 62 mL.  She had to void off the contraction.  She had no leakage associated with coughing reaching a pressure of 148 cm of water.  She was triggering instability.  The instability was provoked and unprovoked throughout.  During voluntary voiding she voided 76 mils of maximum voiding pressure 21 cm of water.  She emptied efficiently.  Bladder neck descent at 1 cm.   At least 90% of the patient's problem is not overactive bladder.   If she fails medical and behavioral therapy I would be offering her a refractory overactive bladder therapy.  If she truly does have stress incontinence this could persist.   Based upon body habitus Botox and InterStim would not be ideal   Myrbetriq helped minimally.  She now only wears about 3 pads a day and only getting up twice at night   She is concerned about percutaneous tibial nerve stimulation because of neuropathy and a skin condition I believe.   Less frequency during the day.  Gets up twice instead of four times.  Three pads per day incontinence stable.    Detrol works better than the oxybutynin gave side effect.  Still using 3 pads a day.  Getting up 2-4 times a day.  Overall much better.  I think we have reached the end of the algorithm.  She thought about percutaneous tibial nerve stimulation but is chosen not to.  Reassess in the year on Detrol   Clinically not infected.  She wanted speak about percutaneous tibial nerve stimulation in detail.  I gave her the handout   Patient still has urge incontinence wearing 3 pads a day.  Detrol helps some.  Clinically not infected.  Frequency stable.  She started to do to do percutaneous tibial nerve stimulation treatments but was having trouble with neuropathy and some leg ulcers and stopped.  She is lost 160 pounds.  She is going to have plastic surgery.     I sent 90x3 Detrol.  I gave her the new beta 3 agonists with samples and prescription.  She will stay on combination if it works well.   Patient has had a dramatic improvement with Gemtesa the new beta 3 agonists greatly reducing her urgency incontinence and improving her quality life.  She is leaking a lot less.  The Detrol is helping minimally but the 2 together are working well.  Clinically not infected.  She is now lost 170 pounds.  She is going to have plastic surgery on September 7 to remove with her pannus   She is failed Myrbetriq and double other antimuscarinics.  She is failed  percutaneous tibial nerve stimulation.  Due to obesity was not a good candidate for InterStim and Botox   Samples and prescription given.   Today Frequency stable.  In the last 2 weeks incontinence worse.  She went from 3 pads a day to 10 pads that can be quite soaked.  Still on the [place] and Detrol.  Getting up 3-4 times a night now.  No infection symptoms.  Plastic surgery on pannus delayed till next month.  On full examination she had a large suprapubic pannus.  I think physically she could have sacral nerve stimulation based on body habitus.  I palpated her lower area   PMH: Past Medical History:  Diagnosis Date   Anxiety    Arthritis    Rhumetoid arthritis   BRCA negative 07/2017   MyRisk neg   Cancer ([place])    uterine   Cellulitis of both lower extremities    Chronic   CHF (congestive heart failure) (HCC)    Coronary artery disease    Depression    Diabetes mellitus without complication ([employer])    Family history of breast cancer 07/2017   MyRisk neg; IBIS=10.5%/riskscore=17%   Family history of ovarian cancer    Fibromyalgia    Fibromyalgia affecting shoulder region    Hyperlipidemia    Hypertension    Spinal stenosis     Surgical History: Past Surgical History:  Procedure Laterality Date   ABLATION SAPHENOUS VEIN W/ RFA     ANAL FISSURECTOMY  01/2004   CORONARY ANGIOPLASTY     CYSTOSCOPY  09/03/2017   Procedure: CYSTOSCOPY;  Surgeon: Gae Dry, MD;  Location: [facility] ORS;  Service: Gynecology;;   DILATION AND CURETTAGE OF UTERUS     HERNIA REPAIR     LAPAROSCOPIC HYSTERECTOMY N/A 09/03/2017   Procedure: HYSTERECTOMY TOTAL LAPAROSCOPIC BSO;  Surgeon: Gae Dry, MD;  Location: [facility] ORS;  Service: Gynecology;  Laterality: N/A;   MEDIAL PARTIAL KNEE REPLACEMENT Left 15/17/6160   UMBILICAL HERNIA REPAIR N/A 09/05/2015   Procedure: HERNIA REPAIR incarcerated UMBILICAL ADULT;  Surgeon: Sherri Rad, MD;  Location: [facility] ORS;  Service: General;  Laterality: N/A;    Vascular Stent  11/04/2013    Home Medications:  Allergies as of 10/02/2021       Reactions   Sulfa Antibiotics Itching, Rash, Swelling   Latex Itching   itching   Doxycycline Rash   Empagliflozin Itching, Other (See Comments)   Itching and frequent yeast infections        Medication List        Accurate as of October 02, 2021 11:11 AM. If you have any questions, ask your nurse or doctor.          acetaminophen 325 MG  tablet Commonly known as: TYLENOL Take 1 tablet by mouth as needed.   aspirin 81 MG chewable tablet Chew by mouth.   buPROPion 300 MG 24 hr tablet Commonly known as: WELLBUTRIN XL TAKE 1 TABLET BY MOUTH EVERY DAY   carvedilol 6.25 MG tablet Commonly known as: COREG Take 1 tablet (6.25 mg total) by mouth 2 (two) times daily with a meal.   clonazePAM 0.5 MG tablet Commonly known as: KLONOPIN Take 0.5-1 tablets (0.25-0.5 mg total) by mouth 2 (two) times daily as needed for anxiety.   clotrimazole-betamethasone cream Commonly known as: LOTRISONE Apply 1 application topically 2 (two) times daily.   DULoxetine 60 MG capsule Commonly known as: CYMBALTA TAKE 1 CAPSULE BY MOUTH EVERY DAY What changed: how much to take   ezetimibe 10 MG tablet Commonly known as: Zetia Take 1 tablet (10 mg total) by mouth daily.   Gemtesa 75 MG Tabs Generic drug: Vibegron Take 75 mg by mouth daily.   losartan 25 MG tablet Commonly known as: COZAAR Take 1 tablet (25 mg total) by mouth daily.   metoprolol succinate 25 MG 24 hr tablet Commonly known as: TOPROL-XL Take 25 mg by mouth daily.   naproxen 500 MG tablet Commonly known as: NAPROSYN Take 1 tablet (500 mg total) by mouth 2 (two) times daily with a meal.   pregabalin 75 MG capsule Commonly known as: LYRICA Take 1 capsule (75 mg total) by mouth 3 (three) times daily.   tolterodine 4 MG 24 hr capsule Commonly known as: Detrol LA Take 1 capsule (4 mg total) by mouth daily.        Allergies:   Allergies  Allergen Reactions   Sulfa Antibiotics Itching, Rash and Swelling   Latex Itching    itching   Doxycycline Rash   Empagliflozin Itching and Other (See Comments)    Itching and frequent yeast infections    Family History: Family History  Problem Relation Age of Onset   Hypertension Mother    Cataracts Mother    Thyroid disease Mother    Dementia Mother    COPD Father    Dementia Father    Cataracts Father    Aneurysm Father        Abdominal & Brain   Diabetes Father    Melanoma Father    Breast cancer Sister 16   Fibromyalgia Sister    Migraines Sister    Hypertension Sister    Breast cancer Sister 105   COPD Sister    Ovarian cancer Sister 5   Migraines Brother    Heart attack Brother    Colon cancer Paternal Uncle        44s   Colon cancer Paternal Uncle        28s   Uterine cancer Cousin    Uterine cancer Cousin     Social History:  reports that she quit smoking about 24 years ago. Her smoking use included cigarettes. She has a 29.00 pack-year smoking history. She has never used smokeless tobacco. She reports that she does not drink alcohol and does not use drugs.  ROS:                                        Physical Exam: BP (!) 184/99   Pulse 82   Ht '5\' 7"'  (1.702 m)   Wt 110.2 kg   LMP  (LMP Unknown) Comment:  age 3  BMI 38.06 kg/m   Constitutional:  Alert and oriented, No acute distress. HEENT: [place] AT, moist mucus membranes.  Trachea midline, no masses.   Laboratory Data: Lab Results  Component Value Date   WBC 6.8 07/31/2021   HGB 13.4 07/31/2021   HCT 41.7 07/31/2021   MCV 83.2 07/31/2021   PLT 277 07/31/2021    Lab Results  Component Value Date   CREATININE 0.85 07/31/2021    No results found for: PSA  No results found for: TESTOSTERONE  Lab Results  Component Value Date   HGBA1C 7.8 (A) 07/18/2021    Urinalysis    Component Value Date/Time   COLORURINE YELLOW (A) 09/04/2015 2209    APPEARANCEUR Cloudy (A) 09/28/2019 1530   LABSPEC 1.016 09/04/2015 2209   PHURINE 7.0 09/04/2015 2209   GLUCOSEU Trace (A) 09/28/2019 1530   HGBUR NEGATIVE 09/04/2015 2209   BILIRUBINUR Negative 09/28/2019 1530   KETONESUR 1+ (A) 09/04/2015 2209   PROTEINUR 2+ (A) 09/28/2019 1530   PROTEINUR NEGATIVE 09/04/2015 2209   NITRITE Negative 09/28/2019 1530   NITRITE NEGATIVE 09/04/2015 2209   LEUKOCYTESUR Negative 09/28/2019 1530    Pertinent Imaging:   Assessment & Plan: Patient has high-volume urge incontinence.  Currently I think we have reached the end of the treatment pathway but she may be a good candidate for InterStim or perhaps even Botox post plastic surgery recovery.  She might be able to perform self-catheterization at that point.  Call if urine culture is positive  Urine looked positive.  I gave her ciprofloxacin 250 mg twice a day for 7 days.  Gave 6 weeks of Gemtesa.  She can come in 6 weeks of his work and get more samples.  I will see him in 3 months postsurgery.  We can entertain Botox and InterStim then if needed   There are no diagnoses linked to this encounter.  No follow-ups on file.  Reece Packer, MD  [facility] [address], [place], [place] [address] [phone]

## 2021-10-04 LAB — URINALYSIS, COMPLETE
Bilirubin, UA: NEGATIVE
Glucose, UA: NEGATIVE
Ketones, UA: NEGATIVE
Leukocytes,UA: NEGATIVE
Nitrite, UA: NEGATIVE
Protein,UA: NEGATIVE
RBC, UA: NEGATIVE
Specific Gravity, UA: 1.02 (ref 1.005–1.030)
Urobilinogen, Ur: 2 mg/dL — ABNORMAL HIGH (ref 0.2–1.0)
pH, UA: 7 (ref 5.0–7.5)

## 2021-10-04 LAB — MICROSCOPIC EXAMINATION: Bacteria, UA: NONE SEEN

## 2021-10-05 LAB — CULTURE, URINE COMPREHENSIVE

## 2021-10-06 ENCOUNTER — Other Ambulatory Visit: Payer: Self-pay | Admitting: Family Medicine

## 2021-10-16 ENCOUNTER — Other Ambulatory Visit: Payer: Self-pay | Admitting: Family Medicine

## 2021-10-16 NOTE — Telephone Encounter (Signed)
Requested medication (s) are due for refill today:   Yes for Cymbalta,  No for metformin it's been discontinued  Requested medication (s) are on the active medication list:   Yes for Cymbalta.   No for metformin 500 mg  Future visit scheduled:   Yes in 3 days with Dr. Brita Romp   Last ordered: Cymbalta 07/15/2021 #90, 0 refills  No Refills Remain Metformin was discontinued  07/18/2021  Returned because no refills remain on Cymbalta.   Metformin was discontinued.   Requested Prescriptions  Pending Prescriptions Disp Refills   metFORMIN (GLUCOPHAGE) 500 MG tablet [Pharmacy Med Name: METFORMIN HCL 500 MG TABLET] 180 tablet 1    Sig: TAKE 1 TABLET BY MOUTH 2 TIMES DAILY WITH A MEAL.     Endocrinology:  Diabetes - Biguanides Passed - 10/16/2021  1:27 AM      Passed - Cr in normal range and within 360 days    Creatinine  Date Value Ref Range Status  08/31/2014 0.63 0.60 - 1.30 mg/dL Final   Creatinine, Ser  Date Value Ref Range Status  07/31/2021 0.85 0.44 - 1.00 mg/dL Final          Passed - HBA1C is between 0 and 7.9 and within 180 days    Hemoglobin A1C  Date Value Ref Range Status  07/18/2021 7.8 (A) 4.0 - 5.6 % Final   Hgb A1c MFr Bld  Date Value Ref Range Status  04/17/2021 7.9 (H) 4.8 - 5.6 % Final    Comment:             Prediabetes: 5.7 - 6.4          Diabetes: >6.4          Glycemic control for adults with diabetes: <7.0           Passed - eGFR in normal range and within 360 days    EGFR (African American)  Date Value Ref Range Status  08/31/2014 >60 >3m/min Final  11/05/2013 >60  Final   GFR calc Af Amer  Date Value Ref Range Status  12/21/2020 72 >59 mL/min/1.73 Final    Comment:    **In accordance with recommendations from the NKF-ASN Task force,**   [facility] is in the process of updating its eGFR calculation to the   2021 CKD-EPI creatinine equation that estimates kidney function   without a race variable.    EGFR (Non-African Amer.)  Date Value  Ref Range Status  08/31/2014 >60 >678mmin Final    Comment:    eGFR values <6054min/1.73 m2 may be an indication of chronic kidney disease (CKD). Calculated eGFR, using the MRDR Study equation, is useful in  patients with stable renal function. The eGFR calculation will not be reliable in acutely ill patients when serum creatinine is changing rapidly. It is not useful in patients on dialysis. The eGFR calculation may not be applicable to patients at the low and high extremes of body sizes, pregnant women, and vegetarians.   11/05/2013 >60  Final    Comment:    eGFR values <52m53mn/1.73 m2 may be an indication of chronic kidney disease (CKD). Calculated eGFR is useful in patients with stable renal function. The eGFR calculation will not be reliable in acutely ill patients when serum creatinine is changing rapidly. It is not useful in  patients on dialysis. The eGFR calculation may not be applicable to patients at the low and high extremes of body sizes, pregnant women, and vegetarians.    GFR, Estimated  Date Value Ref Range Status  07/31/2021 >60 >60 mL/min Final    Comment:    (NOTE) Calculated using the CKD-EPI Creatinine Equation (2021)    GFR  Date Value Ref Range Status  12/13/2016 113.78 >60.00 mL/min Final   eGFR  Date Value Ref Range Status  04/17/2021 83 >59 mL/min/1.73 Final          Passed - Valid encounter within last 6 months    Recent Outpatient Visits           3 months ago Type 2 diabetes mellitus with diabetic polyneuropathy, without long-term current use of insulin ([place])   [facility] [place], Dionne Bucy, MD   6 months ago Hypertension associated with diabetes [facility])   [facility], Dionne Bucy, MD   7 months ago Housing instability   [facility] [place], Dionne Bucy, MD   9 months ago Type 2 diabetes mellitus with diabetic polyneuropathy, without long-term current use of insulin ([place])    [facility], Dionne Bucy, MD   10 months ago Weight loss   [facility], Dionne Bucy, MD       Future Appointments             In 3 days Bacigalupo, Dionne Bucy, MD [facility]   In 2 months MacDiarmid, Nicki Reaper, MD [facility]             DULoxetine (CYMBALTA) 60 MG capsule [Pharmacy Med Name: DULOXETINE HCL DR 60 MG CAP] 90 capsule 0    Sig: TAKE 1 CAPSULE BY MOUTH EVERY DAY     Psychiatry: Antidepressants - SNRI Failed - 10/16/2021  1:27 AM      Failed - Last BP in normal range    BP Readings from Last 1 Encounters:  10/02/21 (!) 184/99          Passed - Completed PHQ-2 or PHQ-9 in the last 360 days      Passed - Valid encounter within last 6 months    Recent Outpatient Visits           3 months ago Type 2 diabetes mellitus with diabetic polyneuropathy, without long-term current use of insulin ([place])   [facility] [place], Dionne Bucy, MD   6 months ago Hypertension associated with diabetes [facility])   [facility], Dionne Bucy, MD   7 months ago Housing instability   [facility], Dionne Bucy, MD   9 months ago Type 2 diabetes mellitus with diabetic polyneuropathy, without long-term current use of insulin [facility])   [facility], Dionne Bucy, MD   10 months ago Weight loss   [facility], Dionne Bucy, MD       Future Appointments             In 3 days Bacigalupo, Dionne Bucy, MD [facility], [place]   In 2 months [place], Nicki Reaper, [facility]

## 2021-10-18 ENCOUNTER — Other Ambulatory Visit: Payer: Self-pay | Admitting: Family Medicine

## 2021-10-18 NOTE — Telephone Encounter (Signed)
Requested Prescriptions  Pending Prescriptions Disp Refills  . naproxen (NAPROSYN) 500 MG tablet [Pharmacy Med Name: NAPROXEN 500 MG TABLET] 60 tablet 0    Sig: TAKE 1 TABLET BY MOUTH 2 TIMES DAILY WITH A MEAL.     Analgesics:  NSAIDS Passed - 10/18/2021  1:18 AM      Passed - Cr in normal range and within 360 days    Creatinine  Date Value Ref Range Status  08/31/2014 0.63 0.60 - 1.30 mg/dL Final   Creatinine, Ser  Date Value Ref Range Status  07/31/2021 0.85 0.44 - 1.00 mg/dL Final         Passed - HGB in normal range and within 360 days    Hemoglobin  Date Value Ref Range Status  07/31/2021 13.4 12.0 - 15.0 g/dL Final  04/17/2021 11.6 11.1 - 15.9 g/dL Final         Passed - Patient is not pregnant      Passed - Valid encounter within last 12 months    Recent Outpatient Visits          3 months ago Type 2 diabetes mellitus with diabetic polyneuropathy, without long-term current use of insulin ([place])   [facility] [place], Dionne Bucy, MD   6 months ago Hypertension associated with diabetes [facility])   [facility], Dionne Bucy, MD   7 months ago Housing instability   [place], Dionne Bucy, MD   9 months ago Type 2 diabetes mellitus with diabetic polyneuropathy, without long-term current use of insulin [facility])   [facility], Dionne Bucy, MD   10 months ago Weight loss   [facility], Dionne Bucy, MD      Future Appointments            Tomorrow Bacigalupo, Dionne Bucy, MD [facility] [place]   In 2 months [place], Nicki Reaper, [facility]

## 2021-10-19 ENCOUNTER — Other Ambulatory Visit: Payer: Self-pay

## 2021-10-19 ENCOUNTER — Encounter: Payer: Self-pay | Admitting: Family Medicine

## 2021-10-19 ENCOUNTER — Telehealth: Payer: Self-pay

## 2021-10-19 ENCOUNTER — Ambulatory Visit (INDEPENDENT_AMBULATORY_CARE_PROVIDER_SITE_OTHER): Payer: Medicare Other | Admitting: Family Medicine

## 2021-10-19 VITALS — BP 136/71 | HR 90 | Temp 98.3°F | Wt 241.0 lb

## 2021-10-19 DIAGNOSIS — E785 Hyperlipidemia, unspecified: Secondary | ICD-10-CM

## 2021-10-19 DIAGNOSIS — G63 Polyneuropathy in diseases classified elsewhere: Secondary | ICD-10-CM | POA: Diagnosis not present

## 2021-10-19 DIAGNOSIS — E1159 Type 2 diabetes mellitus with other circulatory complications: Secondary | ICD-10-CM

## 2021-10-19 DIAGNOSIS — E1142 Type 2 diabetes mellitus with diabetic polyneuropathy: Secondary | ICD-10-CM | POA: Diagnosis not present

## 2021-10-19 DIAGNOSIS — M0579 Rheumatoid arthritis with rheumatoid factor of multiple sites without organ or systems involvement: Secondary | ICD-10-CM | POA: Diagnosis not present

## 2021-10-19 DIAGNOSIS — Z6837 Body mass index (BMI) 37.0-37.9, adult: Secondary | ICD-10-CM

## 2021-10-19 DIAGNOSIS — I152 Hypertension secondary to endocrine disorders: Secondary | ICD-10-CM

## 2021-10-19 DIAGNOSIS — M79601 Pain in right arm: Secondary | ICD-10-CM | POA: Diagnosis not present

## 2021-10-19 DIAGNOSIS — E1169 Type 2 diabetes mellitus with other specified complication: Secondary | ICD-10-CM

## 2021-10-19 LAB — POCT GLYCOSYLATED HEMOGLOBIN (HGB A1C)
Est. average glucose Bld gHb Est-mCnc: 148
Hemoglobin A1C: 6.8 % — AB (ref 4.0–5.6)

## 2021-10-19 MED ORDER — PREDNISONE 20 MG PO TABS: 20.0000 mg | ORAL_TABLET | Freq: Every day | ORAL | 0 refills | Status: AC

## 2021-10-19 NOTE — Assessment & Plan Note (Signed)
F/u Rheumatology Her arm pain is likely related - will have her f/u with Rhuem if not improving

## 2021-10-19 NOTE — Telephone Encounter (Signed)
Copied from [place] [id number]. Topic: General - Inquiry >> Oct 19, 2021  4:14 PM [place] wrote: Pt states states surgeon needs copy of A1-C from today faxed to her,Claire Dillingham Fax [phone]

## 2021-10-19 NOTE — Progress Notes (Signed)
Established patient visit   Patient: [name] [name]   DOB: Apr 24, 1951   70 y.o. Female  MRN: [id number] Visit Date: 10/19/2021  Today's healthcare provider: Lavon Paganini, MD   Chief Complaint  Patient presents with   Hypertension   Diabetes    Subjective    HPI  Diabetes Mellitus Type II, follow-up  Lab Results  Component Value Date   HGBA1C 6.8 (A) 10/19/2021   HGBA1C 7.8 (A) 07/18/2021   HGBA1C 7.9 (H) 04/17/2021   Last seen for diabetes 3 months ago.  Management since then includes continuing healthy lifestyle.    Home blood sugar records: fasting range: 120's  Episodes of hypoglycemia? No    Current insulin regiment: none Most Recent Eye Exam: UTD  --------------------------------------------------------------------------------------------------- Hypertension, follow-up  BP Readings from Last 3 Encounters:  10/19/21 136/71  10/02/21 (!) 184/99  08/02/21 (!) 174/103   Wt Readings from Last 3 Encounters:  10/19/21 241 lb (109.3 kg)  10/02/21 243 lb (110.2 kg)  08/02/21 235 lb 6.4 oz (106.8 kg)     She was last seen for hypertension 3 months ago.  BP at that visit was 174/103. Management since that visit includes no changes. She reports excellent compliance with treatment. She is not having side effects.  She is exercising. She is adherent to low salt diet.   Outside blood pressures are 130's/80's.  She does not smoke.  Use of agents associated with hypertension: none.   --------------------------------------------------------------------------------------------------- Lipid/Cholesterol, follow-up  Last Lipid Panel: Lab Results  Component Value Date   CHOL 267 (H) 04/17/2021   LDLCALC 199 (H) 04/17/2021   LDLDIRECT 183.0 12/13/2016   HDL 49 04/17/2021   TRIG 108 04/17/2021    She was last seen for this 3 months ago.  Management since that visit includes no changes, continue Zetia.  She reports excellent compliance with  treatment. She is not having side effects.   Symptoms: No appetite changes No foot ulcerations  No chest pain No chest pressure/discomfort  No dyspnea No orthopnea  No fatigue No lower extremity edema  No palpitations No paroxysmal nocturnal dyspnea  No nausea No numbness or tingling of extremity  No polydipsia No polyuria  No speech difficulty No syncope     Last metabolic panel Lab Results  Component Value Date   GLUCOSE 161 (H) 07/31/2021   NA 139 07/31/2021   K 4.3 07/31/2021   BUN 14 07/31/2021   CREATININE 0.85 07/31/2021   EGFR 83 04/17/2021   GFRNONAA >60 07/31/2021   CALCIUM 9.6 07/31/2021   AST 15 04/17/2021   ALT 11 04/17/2021   The 10-year ASCVD risk score (Arnett DK, et al., 2019) is: 24%  ---------------------------------------------------------------------------------------------------  R bicep/deltoid pain x1 wk. Has happened before and calmed down with steroids.   Medications: Outpatient Medications Prior to Visit  Medication Sig   acetaminophen (TYLENOL) 325 MG tablet Take 1 tablet by mouth as needed.   aspirin 81 MG chewable tablet Chew by mouth.   buPROPion (WELLBUTRIN XL) 300 MG 24 hr tablet TAKE 1 TABLET BY MOUTH EVERY DAY   carvedilol (COREG) 6.25 MG tablet Take 1 tablet (6.25 mg total) by mouth 2 (two) times daily with a meal.   ciprofloxacin (CIPRO) 250 MG tablet Take 1 tablet (250 mg total) by mouth 2 (two) times daily.   clonazePAM (KLONOPIN) 0.5 MG tablet Take 0.5-1 tablets (0.25-0.5 mg total) by mouth 2 (two) times daily as needed for anxiety.   clotrimazole-betamethasone (LOTRISONE) cream  Apply 1 application topically 2 (two) times daily.   DULoxetine (CYMBALTA) 60 MG capsule TAKE 1 CAPSULE BY MOUTH EVERY DAY (Patient taking differently: Take 60 mg by mouth daily.)   ezetimibe (ZETIA) 10 MG tablet Take 1 tablet (10 mg total) by mouth daily.   losartan (COZAAR) 25 MG tablet TAKE 1 TABLET (25 MG TOTAL) BY MOUTH DAILY.   metoprolol succinate  (TOPROL-XL) 25 MG 24 hr tablet Take 25 mg by mouth daily.   naproxen (NAPROSYN) 500 MG tablet TAKE 1 TABLET BY MOUTH 2 TIMES DAILY WITH A MEAL.   pregabalin (LYRICA) 75 MG capsule Take 1 capsule (75 mg total) by mouth 3 (three) times daily.   tolterodine (DETROL LA) 4 MG 24 hr capsule Take 1 capsule (4 mg total) by mouth daily.   Vibegron (GEMTESA) 75 MG TABS Take 75 mg by mouth daily.   No facility-administered medications prior to visit.    Review of Systems  Constitutional: Negative.   Respiratory: Negative.    Cardiovascular: Negative.   Gastrointestinal:  Positive for constipation. Negative for abdominal distention, abdominal pain, anal bleeding, blood in stool, diarrhea, nausea, rectal pain and vomiting.  Neurological:  Negative for dizziness, light-headedness and headaches.      Objective    BP 136/71 (BP Location: Left Wrist, Patient Position: Sitting, Cuff Size: Normal)   Pulse 90   Temp 98.3 F (36.8 C) (Oral)   Wt 241 lb (109.3 kg)   LMP  (LMP Unknown) Comment: age 29  SpO2 98%   BMI 37.75 kg/m    Physical Exam Vitals reviewed.  Constitutional:      General: She is not in acute distress.    Appearance: Normal appearance. She is well-developed. She is not diaphoretic.  HENT:     Head: Normocephalic and atraumatic.  Eyes:     General: No scleral icterus.    Conjunctiva/sclera: Conjunctivae normal.  Neck:     Thyroid: No thyromegaly.  Cardiovascular:     Rate and Rhythm: Normal rate and regular rhythm.     Pulses: Normal pulses.     Heart sounds: Normal heart sounds. No murmur heard. Pulmonary:     Effort: Pulmonary effort is normal. No respiratory distress.     Breath sounds: Normal breath sounds. No wheezing, rhonchi or rales.  Musculoskeletal:     Cervical back: Neck supple.     Right lower leg: No edema.     Left lower leg: No edema.     Comments: R shoulder with limited abduction and flexion. No TTP over R shoulder, TTP of R deltoid and biceps   Lymphadenopathy:     Cervical: No cervical adenopathy.  Skin:    General: Skin is warm and dry.     Findings: No rash.  Neurological:     Mental Status: She is alert and oriented to person, place, and time. Mental status is at baseline.  Psychiatric:        Mood and Affect: Mood normal.        Behavior: Behavior normal.     Results for orders placed or performed in visit on 10/19/21  POCT glycosylated hemoglobin (Hb A1C)  Result Value Ref Range   Hemoglobin A1C 6.8 (A) 4.0 - 5.6 %   Est. average glucose Bld gHb Est-mCnc 148     Assessment & Plan     Problem List Items Addressed This Visit       Cardiovascular and Mediastinum   Hypertension associated with diabetes ([place])  Well controlled Continue current medications Recheck metabolic panel F/u in 6 months       Relevant Orders   Comprehensive metabolic panel     Endocrine   Diabetes ([place]) - Primary    Well controlled Continue current medications UTD on eye exam, foot exam Discussed diet and exercise F/u in 6 months       Relevant Orders   POCT glycosylated hemoglobin (Hb A1C) (Completed)   Comprehensive metabolic panel   Lipid panel   Hyperlipidemia associated with type 2 diabetes mellitus (HCC)    Previously uncontrolled Continue statin Repeat FLP and CMP Goal LDL < 70       Relevant Orders   Comprehensive metabolic panel   Lipid panel     Nervous and Auditory   Polyneuropathy associated with underlying disease ([place])    Chronic and well controlled Continue lyrica at current dose        Musculoskeletal and Integument   Rheumatoid arthritis ([place])    F/u Rheumatology Her arm pain is likely related - will have her f/u with Rhuem if not improving      Relevant Medications   predniSONE (DELTASONE) 20 MG tablet     Other   Obesity    Discussed importance of healthy weight management Discussed diet and exercise       Relevant Orders   Comprehensive metabolic panel   Lipid panel   Right  arm pain    Recurrent problem Maybe 2/2 RA Low dose prednisone to avoid increasing BP and BG Will f/u with Rheum if not improving        Return in about 6 months (around 04/18/2022) for chronic disease f/u.      I, Lavon Paganini, MD, have reviewed all documentation for this visit. The documentation on 10/19/21 for the exam, diagnosis, procedures, and orders are all accurate and complete.   Aleric Froelich, Dionne Bucy, MD, MPH [facility]

## 2021-10-19 NOTE — Assessment & Plan Note (Signed)
Chronic and well controlled Continue lyrica at current dose

## 2021-10-19 NOTE — Assessment & Plan Note (Signed)
Discussed importance of healthy weight management Discussed diet and exercise  

## 2021-10-19 NOTE — Assessment & Plan Note (Signed)
Well controlled Continue current medications Recheck metabolic panel F/u in 6 months  

## 2021-10-19 NOTE — Assessment & Plan Note (Signed)
Well controlled Continue current medications UTD on eye exam, foot exam Discussed diet and exercise F/u in 6 months

## 2021-10-19 NOTE — Telephone Encounter (Signed)
Patient called to say that her A1c and blood pressure are down.  She said her doctor will be sending [name] the information.

## 2021-10-19 NOTE — Assessment & Plan Note (Signed)
Previously uncontrolled Continue statin Repeat FLP and CMP Goal LDL < 70

## 2021-10-19 NOTE — Assessment & Plan Note (Signed)
Recurrent problem Maybe 2/2 RA Low dose prednisone to avoid increasing BP and BG Will f/u with Rheum if not improving

## 2021-10-20 NOTE — Telephone Encounter (Signed)
Ok to fax A1c result as requested.

## 2021-10-20 NOTE — Telephone Encounter (Signed)
Faxed

## 2021-10-24 DIAGNOSIS — E1169 Type 2 diabetes mellitus with other specified complication: Secondary | ICD-10-CM | POA: Diagnosis not present

## 2021-10-24 DIAGNOSIS — E785 Hyperlipidemia, unspecified: Secondary | ICD-10-CM | POA: Diagnosis not present

## 2021-10-24 DIAGNOSIS — E1159 Type 2 diabetes mellitus with other circulatory complications: Secondary | ICD-10-CM | POA: Diagnosis not present

## 2021-10-24 DIAGNOSIS — I152 Hypertension secondary to endocrine disorders: Secondary | ICD-10-CM | POA: Diagnosis not present

## 2021-10-24 DIAGNOSIS — E1142 Type 2 diabetes mellitus with diabetic polyneuropathy: Secondary | ICD-10-CM | POA: Diagnosis not present

## 2021-10-25 LAB — LIPID PANEL
Chol/HDL Ratio: 4.4 ratio (ref 0.0–4.4)
Cholesterol, Total: 221 mg/dL — ABNORMAL HIGH (ref 100–199)
HDL: 50 mg/dL (ref >39)
LDL Chol Calc (NIH): 122 mg/dL — ABNORMAL HIGH (ref 0–99)
Triglycerides: 279 mg/dL — ABNORMAL HIGH (ref 0–149)
VLDL Cholesterol Cal: 49 mg/dL — ABNORMAL HIGH (ref 5–40)

## 2021-10-25 LAB — COMPREHENSIVE METABOLIC PANEL
ALT: 19 IU/L (ref 0–32)
AST: 15 IU/L (ref 0–40)
Albumin/Globulin Ratio: 1.2 (ref 1.2–2.2)
Albumin: 3.5 g/dL — ABNORMAL LOW (ref 3.8–4.8)
Alkaline Phosphatase: 76 IU/L (ref 44–121)
BUN/Creatinine Ratio: 50 — ABNORMAL HIGH (ref 12–28)
BUN: 35 mg/dL — ABNORMAL HIGH (ref 8–27)
Bilirubin Total: <0.2 mg/dL (ref 0.0–1.2)
CO2: 26 mmol/L (ref 20–29)
Calcium: 9.3 mg/dL (ref 8.7–10.3)
Chloride: 101 mmol/L (ref 96–106)
Creatinine, Ser: 0.7 mg/dL (ref 0.57–1.00)
Globulin, Total: 3 g/dL (ref 1.5–4.5)
Glucose: 145 mg/dL — ABNORMAL HIGH (ref 70–99)
Potassium: 5.1 mmol/L (ref 3.5–5.2)
Sodium: 139 mmol/L (ref 134–144)
Total Protein: 6.5 g/dL (ref 6.0–8.5)
eGFR: 94 mL/min/{1.73_m2} (ref >59)

## 2021-10-31 ENCOUNTER — Telehealth: Payer: Self-pay

## 2021-10-31 DIAGNOSIS — M79601 Pain in right arm: Secondary | ICD-10-CM

## 2021-10-31 DIAGNOSIS — M0579 Rheumatoid arthritis with rheumatoid factor of multiple sites without organ or systems involvement: Secondary | ICD-10-CM

## 2021-10-31 NOTE — Telephone Encounter (Signed)
Copied from [place] [phone]. Topic: General - Other >> Oct 31, 2021  3:03 PM Pawlus, Brayton Layman A wrote: Reason for CRM: Pt was informed during her last visit to call back if her arm pain is not getting better, please advise.

## 2021-11-02 NOTE — Telephone Encounter (Signed)
I advised her to see Ortho or her Rheumatologist if it was not getting better. We can give Ortho referral if she likes.

## 2021-11-02 NOTE — Telephone Encounter (Signed)
Left detailed message for patient to call if she is needing referral to ortho.

## 2021-11-02 NOTE — Telephone Encounter (Signed)
Patient advised as below. Patient request referral to [facility].

## 2021-11-07 DIAGNOSIS — M7551 Bursitis of right shoulder: Secondary | ICD-10-CM | POA: Diagnosis not present

## 2021-12-16 ENCOUNTER — Other Ambulatory Visit: Payer: Self-pay | Admitting: Family Medicine

## 2021-12-16 NOTE — Telephone Encounter (Signed)
Requested Prescriptions  Pending Prescriptions Disp Refills   ezetimibe (ZETIA) 10 MG tablet [Pharmacy Med Name: EZETIMIBE 10 MG TABLET] 90 tablet 3    Sig: TAKE 1 TABLET BY MOUTH EVERY DAY     Cardiovascular:  Antilipid - Sterol Transport Inhibitors Failed - 12/16/2021  9:39 AM      Failed - Total Cholesterol in normal range and within 360 days    Cholesterol, Total  Date Value Ref Range Status  10/24/2021 221 (H) 100 - 199 mg/dL Final         Failed - LDL in normal range and within 360 days    LDL Chol Calc (NIH)  Date Value Ref Range Status  10/24/2021 122 (H) 0 - 99 mg/dL Final   Direct LDL  Date Value Ref Range Status  12/13/2016 183.0 mg/dL Final    Comment:    Optimal:  <100 mg/dLNear or Above Optimal:  100-129 mg/dLBorderline High:  130-159 mg/dLHigh:  160-189 mg/dLVery High:  >190 mg/dL         Failed - Triglycerides in normal range and within 360 days    Triglycerides  Date Value Ref Range Status  10/24/2021 279 (H) 0 - 149 mg/dL Final         Passed - HDL in normal range and within 360 days    HDL  Date Value Ref Range Status  10/24/2021 50 >39 mg/dL Final         Passed - Valid encounter within last 12 months    Recent Outpatient Visits          1 month ago Type 2 diabetes mellitus with diabetic polyneuropathy, without long-term current use of insulin ([place])   [facility] [place], Dionne Bucy, MD   5 months ago Type 2 diabetes mellitus with diabetic polyneuropathy, without long-term current use of insulin ([place])   [facility] [place], Dionne Bucy, MD   8 months ago Hypertension associated with diabetes [facility])   [facility], Dionne Bucy, MD   9 months ago Housing instability   [place], Dionne Bucy, MD   11 months ago Type 2 diabetes mellitus with diabetic polyneuropathy, without long-term current use of insulin [facility])   [place], Dionne Bucy, MD       Future Appointments            In 2 weeks MacDiarmid, Nicki Reaper, [place]

## 2022-01-01 ENCOUNTER — Ambulatory Visit: Payer: Medicare Other | Admitting: Urology

## 2022-01-01 ENCOUNTER — Encounter: Payer: Self-pay | Admitting: Urology

## 2022-01-13 ENCOUNTER — Other Ambulatory Visit: Payer: Self-pay | Admitting: Family Medicine

## 2022-01-14 ENCOUNTER — Other Ambulatory Visit: Payer: Self-pay | Admitting: Family Medicine

## 2022-01-15 NOTE — H&P (View-Only) (Signed)
Patient ID: [name] [name], female    DOB: August 04, 1951, 71 y.o.   MRN: [id number]  Chief Complaint  Patient presents with   Pre-op Exam      ICD-10-CM   1. Panniculitis  M79.3        History of Present Illness: [name] [name] is a 71 y.o.  female  with a history of panniculitis.  She presents for preoperative evaluation for upcoming procedure, panniculectomy, scheduled for 02/05/2022 with Dr. Marla Roe.  The patient has not had problems with anesthesia.  She has had multiple abdominal surgeries without complication from anesthesia.  She did have a positive Pap smear for which she had hysterectomy, but otherwise no history of cancer.  No personal or family history of blood clots or clotting disorder.  Her previous surgery was canceled due to elevated A1c.  She states that it has improved considerably, reviewed her chart and it has downtrending from 7.9-6.8.  Her history of RA is in remission, she is not on any immunosuppressive medication.  Per recommendation of Dr. Corky Sox from before her previously scheduled surgery, her cardiologist, she is to continue metoprolol and aspirin through perioperative period.  She endorses allergies to latex and sulfa.  She does have a bed that mechanically raises at home.  While she lives alone, she endorses family support.  While PD is listed in her chart, she adamantly denies history of PUD and states that she has been taking naproxen for 4 years in addition to Lyrica for her fibromyalgia.  Summary of Previous Visit: Patient's initial surgical consult was with Dr. Marla Roe on 02/03/2021.  At that time, discussed 130 pound weight loss contributing to large, pendulous pannus.  She complained of chafing and irritation of skin beneath pannus, refractory to medical management.  She also complained of chronic bilateral low back pain with sciatica.    PMH Significant for: Panniculitis, chronic bilateral back pain with sciatica, HLD, T2DM, HTN, venous insufficiency,  obstructive CAD, fibromyalgia.   Past Medical History: Allergies: Allergies  Allergen Reactions   Sulfa Antibiotics Itching, Rash and Swelling   Latex Itching    itching   Doxycycline Rash   Empagliflozin Itching and Other (See Comments)    Itching and frequent yeast infections    Current Medications:  Current Outpatient Medications:    acetaminophen (TYLENOL) 325 MG tablet, Take 1 tablet by mouth as needed., Disp: , Rfl:    aspirin 81 MG chewable tablet, Chew by mouth., Disp: , Rfl:    buPROPion (WELLBUTRIN XL) 300 MG 24 hr tablet, TAKE 1 TABLET BY MOUTH EVERY DAY, Disp: 90 tablet, Rfl: 1   carvedilol (COREG) 6.25 MG tablet, Take 1 tablet (6.25 mg total) by mouth 2 (two) times daily with a meal., Disp: 60 tablet, Rfl: 3   clonazePAM (KLONOPIN) 0.5 MG tablet, Take 0.5-1 tablets (0.25-0.5 mg total) by mouth 2 (two) times daily as needed for anxiety., Disp: 30 tablet, Rfl: 0   clotrimazole-betamethasone (LOTRISONE) cream, APPLY TO AFFECTED AREA TWICE A DAY, Disp: 90 g, Rfl: 5   DULoxetine (CYMBALTA) 60 MG capsule, TAKE 1 CAPSULE BY MOUTH EVERY DAY, Disp: 90 capsule, Rfl: 0   ezetimibe (ZETIA) 10 MG tablet, TAKE 1 TABLET BY MOUTH EVERY DAY, Disp: 90 tablet, Rfl: 3   losartan (COZAAR) 25 MG tablet, TAKE 1 TABLET (25 MG TOTAL) BY MOUTH DAILY., Disp: 90 tablet, Rfl: 0   metFORMIN (GLUCOPHAGE) 500 MG tablet, TAKE 1 TABLET BY MOUTH 2 TIMES DAILY WITH A MEAL., Disp:  180 tablet, Rfl: 1   metoprolol succinate (TOPROL-XL) 25 MG 24 hr tablet, Take 25 mg by mouth daily., Disp: , Rfl:    naproxen (NAPROSYN) 500 MG tablet, TAKE 1 TABLET BY MOUTH 2 TIMES DAILY WITH A MEAL., Disp: 60 tablet, Rfl: 0   pregabalin (LYRICA) 75 MG capsule, Take 1 capsule (75 mg total) by mouth 3 (three) times daily., Disp: 90 capsule, Rfl: 1   tolterodine (DETROL LA) 4 MG 24 hr capsule, Take 1 capsule (4 mg total) by mouth daily., Disp: 90 capsule, Rfl: 3   Vibegron (GEMTESA) 75 MG TABS, Take 75 mg by mouth daily., Disp: 90  tablet, Rfl: 3   ciprofloxacin (CIPRO) 250 MG tablet, Take 1 tablet (250 mg total) by mouth 2 (two) times daily., Disp: 14 tablet, Rfl: 0  Past Medical Problems: Past Medical History:  Diagnosis Date   Anxiety    Arthritis    Rhumetoid arthritis   BRCA negative 07/2017   MyRisk neg   Cancer ([place])    uterine   Cellulitis of both lower extremities    Chronic   CHF (congestive heart failure) (HCC)    Coronary artery disease    Depression    Diabetes mellitus without complication ([place])    Family history of breast cancer 07/2017   MyRisk neg; IBIS=10.5%/riskscore=17%   Family history of ovarian cancer    Fibromyalgia    Fibromyalgia affecting shoulder region    Hyperlipidemia    Hypertension    Spinal stenosis     Past Surgical History: Past Surgical History:  Procedure Laterality Date   ABLATION SAPHENOUS VEIN W/ RFA     ANAL FISSURECTOMY  01/2004   CORONARY ANGIOPLASTY     CYSTOSCOPY  09/03/2017   Procedure: CYSTOSCOPY;  Surgeon: Gae Dry, MD;  Location: [facility] ORS;  Service: Gynecology;;   DILATION AND CURETTAGE OF UTERUS     HERNIA REPAIR     LAPAROSCOPIC HYSTERECTOMY N/A 09/03/2017   Procedure: HYSTERECTOMY TOTAL LAPAROSCOPIC BSO;  Surgeon: Gae Dry, MD;  Location: [facility] ORS;  Service: Gynecology;  Laterality: N/A;   MEDIAL PARTIAL KNEE REPLACEMENT Left 58/85/0277   UMBILICAL HERNIA REPAIR N/A 09/05/2015   Procedure: HERNIA REPAIR incarcerated UMBILICAL ADULT;  Surgeon: Sherri Rad, MD;  Location: [facility] ORS;  Service: General;  Laterality: N/A;   Vascular Stent  11/04/2013    Social History: Social History   Socioeconomic History   Marital status: Widowed    Spouse name: Shanon Brow   Number of children: 1   Years of education: 9th Grade   Highest education level: 9th grade  Occupational History   Occupation: Disabled    Comment: no longer receiving   Occupation: on SS  Tobacco Use   Smoking status: Former    Packs/day: 1.00    Years: 29.00    Pack  years: 29.00    Types: Cigarettes    Quit date: 12/03/1996    Years since quitting: 25.1   Smokeless tobacco: Never  Vaping Use   Vaping Use: Never used  Substance and Sexual Activity   Alcohol use: No   Drug use: No   Sexual activity: Not Currently  Other Topics Concern   Not on file  Social History Narrative   Not on file   Social Determinants of Health   Financial Resource Strain: Low Risk    Difficulty of Paying Living Expenses: Not hard at all  Food Insecurity: No Food Insecurity   Worried About Charity fundraiser in  the Last Year: Never true   Ran Out of Food in the Last Year: Never true  Transportation Needs: No Transportation Needs   Lack of Transportation (Medical): No   Lack of Transportation (Non-Medical): No  Physical Activity: Sufficiently Active   Days of Exercise per Week: 7 days   Minutes of Exercise per Session: 30 min  Stress: Stress Concern Present   Feeling of Stress : Rather much  Social Connections: Socially Isolated   Frequency of Communication with Friends and Family: More than three times a week   Frequency of Social Gatherings with Friends and Family: More than three times a week   Attends Religious Services: Never   Marine scientist or Organizations: No   Attends Archivist Meetings: Never   Marital Status: Widowed  Human resources officer Violence: Not At Risk   Fear of Current or Ex-Partner: No   Emotionally Abused: No   Physically Abused: No   Sexually Abused: No    Family History: Family History  Problem Relation Age of Onset   Hypertension Mother    Cataracts Mother    Thyroid disease Mother    Dementia Mother    COPD Father    Dementia Father    Cataracts Father    Aneurysm Father        Abdominal & Brain   Diabetes Father    Melanoma Father    Breast cancer Sister 76   Fibromyalgia Sister    Migraines Sister    Hypertension Sister    Breast cancer Sister 39   COPD Sister    Ovarian cancer Sister 20   Migraines  Brother    Heart attack Brother    Colon cancer Paternal Uncle        32s   Colon cancer Paternal Uncle        2s   Uterine cancer Cousin    Uterine cancer Cousin     Review of Systems: ROS Denies recent illness or infection, fevers or chills, chest pain or difficulty breathing.  Physical Exam: Vital Signs BP (!) 177/80 (BP Location: Left Arm, Patient Position: Sitting, Cuff Size: Normal)    Pulse 97    Ht _0  (1.727 m)    Wt 246 lb (111.6 kg)    LMP  (LMP Unknown) Comment: age 52   SpO2 97%    BMI 37.40 kg/m   Physical Exam Constitutional:      General: Not in acute distress.    Appearance: Normal appearance. Not ill-appearing.  HENT:     Head: Normocephalic and atraumatic.  Eyes:     Pupils: Pupils are equal, round. Cardiovascular:     Rate and Rhythm: Normal rate.    Pulses: Normal pulses.  Pulmonary:     Effort: No respiratory distress or increased work of breathing.  Speaks in full sentences. Abdominal:     General: Abdomen is flat. No distension.   Musculoskeletal: Normal range of motion.  Venous stasis dermatitis.  Scattered varicosities. Skin:    General: Skin is warm and dry.     Findings: No erythema or rash.  Neurological:     Mental Status: Alert and oriented to person, place, and time.  Psychiatric:        Mood and Affect: Mood normal.        Behavior: Behavior normal.    Assessment/Plan: The patient is scheduled for panniculectomy with Dr. Marla Roe.  Risks, benefits, and alternatives of procedure discussed, questions answered and consent obtained.  Smoking Status: Non-smoker, quit 35 years ago.  Caprini Score: 6; Risk Factors include: Age, BMI greater than 25, varicosities, and length of planned surgery. Recommendation for mechanical prophylaxis. Encourage early ambulation.   Pictures obtained: Updated her photos today.  Post-op Rx sent to pharmacy: Oxycodone, Zofran, Keflex.  Patient was provided with the General Surgical Risk consent  document and Pain Medication Agreement prior to their appointment.  They had adequate time to read through the risk consent documents and Pain Medication Agreement. We also discussed them in person together during this preop appointment. All of their questions were answered to their satisfaction.  Recommended calling if they have any further questions.  Risk consent form and Pain Medication Agreement to be scanned into patient's chart.  The risk that can be encountered for this procedure were discussed and include the following but not limited to these: asymmetry, fluid accumulation, firmness of the tissue, skin loss, decrease or no sensation, fat necrosis, bleeding, infection, healing delay.  Deep vein thrombosis, cardiac and pulmonary complications are risks to any procedure.  There are risks of anesthesia, changes to skin sensation and injury to nerves or blood vessels.  The muscle can be temporarily or permanently injured.  You may have an allergic reaction to tape, suture, glue, blood products which can result in skin discoloration, swelling, pain, skin lesions, poor healing.  Any of these can lead to the need for revisonal surgery or stage procedures.  Weight gain and weigh loss can also effect the long term appearance. The results are not guaranteed to last a lifetime.  Future surgery may be required.      Electronically signed by: Krista Blue, PA-C 01/17/2022 10:29 AM

## 2022-01-15 NOTE — Telephone Encounter (Signed)
Requested Prescriptions  Pending Prescriptions Disp Refills   losartan (COZAAR) 25 MG tablet [Pharmacy Med Name: LOSARTAN POTASSIUM 25 MG TAB] 90 tablet 0    Sig: TAKE 1 TABLET (25 MG TOTAL) BY MOUTH DAILY.     Cardiovascular:  Angiotensin Receptor Blockers Passed - 01/13/2022 10:01 AM      Passed - Cr in normal range and within 180 days    Creatinine  Date Value Ref Range Status  08/31/2014 0.63 0.60 - 1.30 mg/dL Final   Creatinine, Ser  Date Value Ref Range Status  10/24/2021 0.70 0.57 - 1.00 mg/dL Final         Passed - K in normal range and within 180 days    Potassium  Date Value Ref Range Status  10/24/2021 5.1 3.5 - 5.2 mmol/L Final  08/31/2014 4.0 3.5 - 5.1 mmol/L Final         Passed - Patient is not pregnant      Passed - Last BP in normal range    BP Readings from Last 1 Encounters:  10/19/21 136/71         Passed - Valid encounter within last 6 months    Recent Outpatient Visits          2 months ago Type 2 diabetes mellitus with diabetic polyneuropathy, without long-term current use of insulin ([place])   [facility], Dionne Bucy, MD   6 months ago Type 2 diabetes mellitus with diabetic polyneuropathy, without long-term current use of insulin [facility])   [facility], Dionne Bucy, MD   9 months ago Hypertension associated with diabetes [facility])   [facility], Dionne Bucy, MD   10 months ago Housing instability   [place], Dionne Bucy, MD   1 year ago Type 2 diabetes mellitus with diabetic polyneuropathy, without long-term current use of insulin [facility])   [facility], Dionne Bucy, MD

## 2022-01-15 NOTE — Progress Notes (Signed)
Patient ID: [name] [name], female    DOB: August 04, 1951, 71 y.o.   MRN: [id number]  Chief Complaint  Patient presents with   Pre-op Exam      ICD-10-CM   1. Panniculitis  M79.3        History of Present Illness: [name] [name] is a 71 y.o.  female  with a history of panniculitis.  She presents for preoperative evaluation for upcoming procedure, panniculectomy, scheduled for 02/05/2022 with Dr. Marla Roe.  The patient has not had problems with anesthesia.  She has had multiple abdominal surgeries without complication from anesthesia.  She did have a positive Pap smear for which she had hysterectomy, but otherwise no history of cancer.  No personal or family history of blood clots or clotting disorder.  Her previous surgery was canceled due to elevated A1c.  She states that it has improved considerably, reviewed her chart and it has downtrending from 7.9-6.8.  Her history of RA is in remission, she is not on any immunosuppressive medication.  Per recommendation of Dr. Corky Sox from before her previously scheduled surgery, her cardiologist, she is to continue metoprolol and aspirin through perioperative period.  She endorses allergies to latex and sulfa.  She does have a bed that mechanically raises at home.  While she lives alone, she endorses family support.  While PD is listed in her chart, she adamantly denies history of PUD and states that she has been taking naproxen for 4 years in addition to Lyrica for her fibromyalgia.  Summary of Previous Visit: Patient's initial surgical consult was with Dr. Marla Roe on 02/03/2021.  At that time, discussed 130 pound weight loss contributing to large, pendulous pannus.  She complained of chafing and irritation of skin beneath pannus, refractory to medical management.  She also complained of chronic bilateral low back pain with sciatica.    PMH Significant for: Panniculitis, chronic bilateral back pain with sciatica, HLD, T2DM, HTN, venous insufficiency,  obstructive CAD, fibromyalgia.   Past Medical History: Allergies: Allergies  Allergen Reactions   Sulfa Antibiotics Itching, Rash and Swelling   Latex Itching    itching   Doxycycline Rash   Empagliflozin Itching and Other (See Comments)    Itching and frequent yeast infections    Current Medications:  Current Outpatient Medications:    acetaminophen (TYLENOL) 325 MG tablet, Take 1 tablet by mouth as needed., Disp: , Rfl:    aspirin 81 MG chewable tablet, Chew by mouth., Disp: , Rfl:    buPROPion (WELLBUTRIN XL) 300 MG 24 hr tablet, TAKE 1 TABLET BY MOUTH EVERY DAY, Disp: 90 tablet, Rfl: 1   carvedilol (COREG) 6.25 MG tablet, Take 1 tablet (6.25 mg total) by mouth 2 (two) times daily with a meal., Disp: 60 tablet, Rfl: 3   clonazePAM (KLONOPIN) 0.5 MG tablet, Take 0.5-1 tablets (0.25-0.5 mg total) by mouth 2 (two) times daily as needed for anxiety., Disp: 30 tablet, Rfl: 0   clotrimazole-betamethasone (LOTRISONE) cream, APPLY TO AFFECTED AREA TWICE A DAY, Disp: 90 g, Rfl: 5   DULoxetine (CYMBALTA) 60 MG capsule, TAKE 1 CAPSULE BY MOUTH EVERY DAY, Disp: 90 capsule, Rfl: 0   ezetimibe (ZETIA) 10 MG tablet, TAKE 1 TABLET BY MOUTH EVERY DAY, Disp: 90 tablet, Rfl: 3   losartan (COZAAR) 25 MG tablet, TAKE 1 TABLET (25 MG TOTAL) BY MOUTH DAILY., Disp: 90 tablet, Rfl: 0   metFORMIN (GLUCOPHAGE) 500 MG tablet, TAKE 1 TABLET BY MOUTH 2 TIMES DAILY WITH A MEAL., Disp:  180 tablet, Rfl: 1   metoprolol succinate (TOPROL-XL) 25 MG 24 hr tablet, Take 25 mg by mouth daily., Disp: , Rfl:    naproxen (NAPROSYN) 500 MG tablet, TAKE 1 TABLET BY MOUTH 2 TIMES DAILY WITH A MEAL., Disp: 60 tablet, Rfl: 0   pregabalin (LYRICA) 75 MG capsule, Take 1 capsule (75 mg total) by mouth 3 (three) times daily., Disp: 90 capsule, Rfl: 1   tolterodine (DETROL LA) 4 MG 24 hr capsule, Take 1 capsule (4 mg total) by mouth daily., Disp: 90 capsule, Rfl: 3   Vibegron (GEMTESA) 75 MG TABS, Take 75 mg by mouth daily., Disp: 90  tablet, Rfl: 3   ciprofloxacin (CIPRO) 250 MG tablet, Take 1 tablet (250 mg total) by mouth 2 (two) times daily., Disp: 14 tablet, Rfl: 0  Past Medical Problems: Past Medical History:  Diagnosis Date   Anxiety    Arthritis    Rhumetoid arthritis   BRCA negative 07/2017   MyRisk neg   Cancer ([place])    uterine   Cellulitis of both lower extremities    Chronic   CHF (congestive heart failure) (HCC)    Coronary artery disease    Depression    Diabetes mellitus without complication ([place])    Family history of breast cancer 07/2017   MyRisk neg; IBIS=10.5%/riskscore=17%   Family history of ovarian cancer    Fibromyalgia    Fibromyalgia affecting shoulder region    Hyperlipidemia    Hypertension    Spinal stenosis     Past Surgical History: Past Surgical History:  Procedure Laterality Date   ABLATION SAPHENOUS VEIN W/ RFA     ANAL FISSURECTOMY  01/2004   CORONARY ANGIOPLASTY     CYSTOSCOPY  09/03/2017   Procedure: CYSTOSCOPY;  Surgeon: Gae Dry, MD;  Location: [facility] ORS;  Service: Gynecology;;   DILATION AND CURETTAGE OF UTERUS     HERNIA REPAIR     LAPAROSCOPIC HYSTERECTOMY N/A 09/03/2017   Procedure: HYSTERECTOMY TOTAL LAPAROSCOPIC BSO;  Surgeon: Gae Dry, MD;  Location: [facility] ORS;  Service: Gynecology;  Laterality: N/A;   MEDIAL PARTIAL KNEE REPLACEMENT Left 58/85/0277   UMBILICAL HERNIA REPAIR N/A 09/05/2015   Procedure: HERNIA REPAIR incarcerated UMBILICAL ADULT;  Surgeon: Sherri Rad, MD;  Location: [facility] ORS;  Service: General;  Laterality: N/A;   Vascular Stent  11/04/2013    Social History: Social History   Socioeconomic History   Marital status: Widowed    Spouse name: Shanon Brow   Number of children: 1   Years of education: 9th Grade   Highest education level: 9th grade  Occupational History   Occupation: Disabled    Comment: no longer receiving   Occupation: on SS  Tobacco Use   Smoking status: Former    Packs/day: 1.00    Years: 29.00    Pack  years: 29.00    Types: Cigarettes    Quit date: 12/03/1996    Years since quitting: 25.1   Smokeless tobacco: Never  Vaping Use   Vaping Use: Never used  Substance and Sexual Activity   Alcohol use: No   Drug use: No   Sexual activity: Not Currently  Other Topics Concern   Not on file  Social History Narrative   Not on file   Social Determinants of Health   Financial Resource Strain: Low Risk    Difficulty of Paying Living Expenses: Not hard at all  Food Insecurity: No Food Insecurity   Worried About Charity fundraiser in  the Last Year: Never true   Ran Out of Food in the Last Year: Never true  Transportation Needs: No Transportation Needs   Lack of Transportation (Medical): No   Lack of Transportation (Non-Medical): No  Physical Activity: Sufficiently Active   Days of Exercise per Week: 7 days   Minutes of Exercise per Session: 30 min  Stress: Stress Concern Present   Feeling of Stress : Rather much  Social Connections: Socially Isolated   Frequency of Communication with Friends and Family: More than three times a week   Frequency of Social Gatherings with Friends and Family: More than three times a week   Attends Religious Services: Never   Marine scientist or Organizations: No   Attends Archivist Meetings: Never   Marital Status: Widowed  Human resources officer Violence: Not At Risk   Fear of Current or Ex-Partner: No   Emotionally Abused: No   Physically Abused: No   Sexually Abused: No    Family History: Family History  Problem Relation Age of Onset   Hypertension Mother    Cataracts Mother    Thyroid disease Mother    Dementia Mother    COPD Father    Dementia Father    Cataracts Father    Aneurysm Father        Abdominal & Brain   Diabetes Father    Melanoma Father    Breast cancer Sister 76   Fibromyalgia Sister    Migraines Sister    Hypertension Sister    Breast cancer Sister 39   COPD Sister    Ovarian cancer Sister 20   Migraines  Brother    Heart attack Brother    Colon cancer Paternal Uncle        32s   Colon cancer Paternal Uncle        2s   Uterine cancer Cousin    Uterine cancer Cousin     Review of Systems: ROS Denies recent illness or infection, fevers or chills, chest pain or difficulty breathing.  Physical Exam: Vital Signs BP (!) 177/80 (BP Location: Left Arm, Patient Position: Sitting, Cuff Size: Normal)    Pulse 97    Ht _0  (1.727 m)    Wt 246 lb (111.6 kg)    LMP  (LMP Unknown) Comment: age 52   SpO2 97%    BMI 37.40 kg/m   Physical Exam Constitutional:      General: Not in acute distress.    Appearance: Normal appearance. Not ill-appearing.  HENT:     Head: Normocephalic and atraumatic.  Eyes:     Pupils: Pupils are equal, round. Cardiovascular:     Rate and Rhythm: Normal rate.    Pulses: Normal pulses.  Pulmonary:     Effort: No respiratory distress or increased work of breathing.  Speaks in full sentences. Abdominal:     General: Abdomen is flat. No distension.   Musculoskeletal: Normal range of motion.  Venous stasis dermatitis.  Scattered varicosities. Skin:    General: Skin is warm and dry.     Findings: No erythema or rash.  Neurological:     Mental Status: Alert and oriented to person, place, and time.  Psychiatric:        Mood and Affect: Mood normal.        Behavior: Behavior normal.    Assessment/Plan: The patient is scheduled for panniculectomy with Dr. Marla Roe.  Risks, benefits, and alternatives of procedure discussed, questions answered and consent obtained.  Smoking Status: Non-smoker, quit 35 years ago.  Caprini Score: 6; Risk Factors include: Age, BMI greater than 25, varicosities, and length of planned surgery. Recommendation for mechanical prophylaxis. Encourage early ambulation.   Pictures obtained: Updated her photos today.  Post-op Rx sent to pharmacy: Oxycodone, Zofran, Keflex.  Patient was provided with the General Surgical Risk consent  document and Pain Medication Agreement prior to their appointment.  They had adequate time to read through the risk consent documents and Pain Medication Agreement. We also discussed them in person together during this preop appointment. All of their questions were answered to their satisfaction.  Recommended calling if they have any further questions.  Risk consent form and Pain Medication Agreement to be scanned into patient's chart.  The risk that can be encountered for this procedure were discussed and include the following but not limited to these: asymmetry, fluid accumulation, firmness of the tissue, skin loss, decrease or no sensation, fat necrosis, bleeding, infection, healing delay.  Deep vein thrombosis, cardiac and pulmonary complications are risks to any procedure.  There are risks of anesthesia, changes to skin sensation and injury to nerves or blood vessels.  The muscle can be temporarily or permanently injured.  You may have an allergic reaction to tape, suture, glue, blood products which can result in skin discoloration, swelling, pain, skin lesions, poor healing.  Any of these can lead to the need for revisonal surgery or stage procedures.  Weight gain and weigh loss can also effect the long term appearance. The results are not guaranteed to last a lifetime.  Future surgery may be required.      Electronically signed by: Krista Blue, PA-C 01/17/2022 10:29 AM

## 2022-01-17 ENCOUNTER — Encounter: Payer: Self-pay | Admitting: Physician Assistant

## 2022-01-17 ENCOUNTER — Telehealth: Payer: Self-pay

## 2022-01-17 ENCOUNTER — Ambulatory Visit (INDEPENDENT_AMBULATORY_CARE_PROVIDER_SITE_OTHER): Payer: Medicare Other | Admitting: Physician Assistant

## 2022-01-17 ENCOUNTER — Ambulatory Visit: Payer: Self-pay | Admitting: *Deleted

## 2022-01-17 ENCOUNTER — Other Ambulatory Visit: Payer: Self-pay

## 2022-01-17 VITALS — BP 177/80 | HR 97 | Ht 68.0 in | Wt 246.0 lb

## 2022-01-17 DIAGNOSIS — M793 Panniculitis, unspecified: Secondary | ICD-10-CM

## 2022-01-17 MED ORDER — CEPHALEXIN 500 MG PO CAPS: 500.0000 mg | ORAL_CAPSULE | Freq: Four times a day (QID) | ORAL | 0 refills | Status: AC

## 2022-01-17 MED ORDER — ONDANSETRON 4 MG PO TBDP: 4.0000 mg | ORAL_TABLET | Freq: Three times a day (TID) | ORAL | 0 refills | Status: DC | PRN

## 2022-01-17 MED ORDER — OXYCODONE HCL 5 MG PO TABS: 5.0000 mg | ORAL_TABLET | Freq: Four times a day (QID) | ORAL | 0 refills | Status: AC | PRN

## 2022-01-17 NOTE — Telephone Encounter (Signed)
Patient triaged in another encounter today, appointment tomorrow.

## 2022-01-17 NOTE — Telephone Encounter (Signed)
Copied from [place] [id number]. Topic: General - Other >> Jan 17, 2022  2:55 PM Yvette Rack wrote: Reason for CRM: Pt stated her shoulder is inflamed again and she would like for a Rx for prednisone to be sent to [id number] - [place], [place]

## 2022-01-17 NOTE — Telephone Encounter (Signed)
Attempted to reach patient to triage but voicemail box was full, patient was last seen for arm pain in 10/19/21, patient will need office visit to address. If patient returns call okay for [facility] nurse to triage patient. KW

## 2022-01-17 NOTE — Telephone Encounter (Signed)
°  Chief Complaint: Shoulder pain Symptoms: right shoulder swollen, 8/10 pain with movement Frequency: 1 Week ago, H/O Pertinent Negatives: Patient denies redness, warmth Disposition: [] ED /[] Urgent Care (no appt availability in office) / [x] Appointment(In office/virtual)/ []  [facility]/ [] Home Care/ [] Refused Recommended Disposition /[] [facility]/ []  Follow-up with PCP Additional Notes: Pt had called requesting prednisone. Triaged, states H/O inflammation, Xray 11/22 neg. Reports "Overuse, doing to much cleaning. Probably my fibromyalgia."    Reason for Disposition  [1] Unable to use arm at all AND [2] because of shoulder pain or stiffness  Answer Assessment - Initial Assessment Questions 1. ONSET: "When did the pain start?"     1 week ago 2. LOCATION: "Where is the pain located?"     Right Shoulder  3. PAIN: "How bad is the pain?" (Scale 1-10; or mild, moderate, severe)   - MILD (1-3): doesn't interfere with normal activities   - MODERATE (4-7): interferes with normal activities (e.g., work or school) or awakens from sleep   - SEVERE (8-10): excruciating pain, unable to do any normal activities, unable to move arm at all due to pain     8/10 with movement 4. WORK OR EXERCISE: "Has there been any recent work or exercise that involved this part of the body?"     Yes, overworked cleaning 5. CAUSE: "What do you think is causing the shoulder pain?"     Inflammation  from fibromyalgia. 6. OTHER SYMPTOMS: "Do you have any other symptoms?" (e.g., neck pain, swelling, rash, fever, numbness, weakness)     Swelling between shoulder and elbow, "1/2 size bigger"  Protocols used: Shoulder Pain-A-AH

## 2022-01-18 ENCOUNTER — Encounter: Payer: Self-pay | Admitting: Physician Assistant

## 2022-01-18 ENCOUNTER — Ambulatory Visit (INDEPENDENT_AMBULATORY_CARE_PROVIDER_SITE_OTHER): Payer: Medicare Other | Admitting: Physician Assistant

## 2022-01-18 VITALS — BP 149/86 | HR 87 | Ht 67.5 in | Wt 248.5 lb

## 2022-01-18 DIAGNOSIS — M7521 Bicipital tendinitis, right shoulder: Secondary | ICD-10-CM

## 2022-01-18 DIAGNOSIS — Z1211 Encounter for screening for malignant neoplasm of colon: Secondary | ICD-10-CM

## 2022-01-18 DIAGNOSIS — Z1231 Encounter for screening mammogram for malignant neoplasm of breast: Secondary | ICD-10-CM

## 2022-01-18 MED ORDER — PREDNISONE 20 MG PO TABS: 20.0000 mg | ORAL_TABLET | Freq: Every day | ORAL | 0 refills | Status: DC

## 2022-01-18 NOTE — Progress Notes (Signed)
I,Sha'taria Tyson,acting as a Education administrator for [employer], PA-C.,have documented all relevant documentation on the behalf of Mikey Kirschner, PA-C,as directed by  Mikey Kirschner, PA-C while in the presence of Mikey Kirschner, PA-C.   Established patient visit   Patient: [name] [name]   DOB: June 26, 1951   71 y.o. Female  MRN: [id number] Visit Date: 01/18/2022  Today's healthcare provider: Mikey Kirschner, PA-C   Cc. Right arm pain x 3-4 days  Subjective    HPI  [name] [name] is a 71 y/o female who presents today with right upper arm pain and swelling. She reports cleaning and moving her house around to prepare for her upcoming surgery. This has happened before, 11/22 and she had right shoulder and right upper arm xrays that were negative. Treated with a course of prednisone. Denies significant pain at result, but 8/10 pain when moving. Only taking tylenol for pain.   Medications: Outpatient Medications Prior to Visit  Medication Sig   acetaminophen (TYLENOL) 325 MG tablet Take 1 tablet by mouth as needed.   aspirin 81 MG chewable tablet Chew by mouth.   buPROPion (WELLBUTRIN XL) 300 MG 24 hr tablet TAKE 1 TABLET BY MOUTH EVERY DAY   carvedilol (COREG) 6.25 MG tablet Take 1 tablet (6.25 mg total) by mouth 2 (two) times daily with a meal.   clotrimazole-betamethasone (LOTRISONE) cream APPLY TO AFFECTED AREA TWICE A DAY   DULoxetine (CYMBALTA) 60 MG capsule TAKE 1 CAPSULE BY MOUTH EVERY DAY   ezetimibe (ZETIA) 10 MG tablet TAKE 1 TABLET BY MOUTH EVERY DAY   metFORMIN (GLUCOPHAGE) 500 MG tablet TAKE 1 TABLET BY MOUTH 2 TIMES DAILY WITH A MEAL.   metoprolol succinate (TOPROL-XL) 25 MG 24 hr tablet Take 25 mg by mouth daily.   naproxen (NAPROSYN) 500 MG tablet TAKE 1 TABLET BY MOUTH 2 TIMES DAILY WITH A MEAL.   ondansetron (ZOFRAN-ODT) 4 MG disintegrating tablet Take 1 tablet (4 mg total) by mouth every 8 (eight) hours as needed for nausea or vomiting.   oxyCODONE (ROXICODONE) 5 MG immediate release  tablet Take 1 tablet (5 mg total) by mouth every 6 (six) hours as needed for up to 5 days for severe pain.   pregabalin (LYRICA) 75 MG capsule Take 1 capsule (75 mg total) by mouth 3 (three) times daily.   Vibegron (GEMTESA) 75 MG TABS Take 75 mg by mouth daily.   cephALEXin (KEFLEX) 500 MG capsule Take 1 capsule (500 mg total) by mouth 4 (four) times daily for 3 days. (Patient not taking: Reported on 01/18/2022)   clonazePAM (KLONOPIN) 0.5 MG tablet Take 0.5-1 tablets (0.25-0.5 mg total) by mouth 2 (two) times daily as needed for anxiety. (Patient not taking: Reported on 01/18/2022)   losartan (COZAAR) 25 MG tablet TAKE 1 TABLET (25 MG TOTAL) BY MOUTH DAILY. (Patient not taking: Reported on 01/18/2022)   tolterodine (DETROL LA) 4 MG 24 hr capsule Take 1 capsule (4 mg total) by mouth daily. (Patient not taking: Reported on 01/18/2022)   [DISCONTINUED] ciprofloxacin (CIPRO) 250 MG tablet Take 1 tablet (250 mg total) by mouth 2 (two) times daily. (Patient not taking: Reported on 01/18/2022)   No facility-administered medications prior to visit.    Review of Systems  Constitutional:  Negative for fatigue and fever.  Respiratory:  Negative for cough and shortness of breath.   Cardiovascular:  Negative for chest pain and leg swelling.  Gastrointestinal:  Negative for abdominal pain.  Musculoskeletal:  Positive for myalgias.  Neurological:  Negative for dizziness and headaches.      Objective    Blood pressure (!) 149/86, pulse 87, height 5' 7.5" (1.715 m), weight 248 lb 8 oz (112.7 kg), SpO2 99 %.   Physical Exam Constitutional:      Appearance: She is not ill-appearing.  HENT:     Head: Normocephalic.  Eyes:     Conjunctiva/sclera: Conjunctivae normal.  Cardiovascular:     Rate and Rhythm: Normal rate.  Pulmonary:     Effort: Pulmonary effort is normal. No respiratory distress.  Musculoskeletal:     Comments: Right biceps tendon with edema and pinpoint tenderness. Patient has no  abduction, flexion or extension of right arm secondary to pain.  Neurological:     General: No focal deficit present.     Mental Status: She is alert and oriented to person, place, and time.  Psychiatric:        Mood and Affect: Mood normal.        Behavior: Behavior normal.     No results found for any visits on 01/18/22.  Assessment & Plan     Biceps tendinitis, right Rx prednisone pt aware of effect on blood sugar Advised not to take nsaids , ice frequently at home This is the second occurrence w/in 6 mo, advised if it occurs again, would refer back to ortho for likely MRI.   Per pt request ordered screening mammo and colonoscopy.  Return as scheduled for chronic f/u.     I, Mikey Kirschner, PA-C have reviewed all documentation for this visit. The documentation on  01/18/2022 for the exam, diagnosis, procedures, and orders are all accurate and complete.    Mikey Kirschner, PA-C  [facility] [phone] (phone) [phone] (fax)  [place]

## 2022-01-29 ENCOUNTER — Telehealth: Payer: Self-pay

## 2022-01-29 NOTE — Telephone Encounter (Signed)
Faxed Surgical Clearance to Dr Corky Sox with [facility] if patient is ok to hold ASA 81mg  7 days prior to surgery on 02/06/22

## 2022-01-29 NOTE — Pre-Procedure Instructions (Signed)
Surgical Instructions    Your procedure is scheduled on Monday, March 6th.  Report to [facility] Main Entrance "A" at 05:30 A.M., then check in with the Admitting office.  Call this number if you have problems the morning of surgery:  [phone]   If you have any questions prior to your surgery date call [phone]: Open Monday-Friday 8am-4pm    Remember:  Do not eat after midnight the night before your surgery  You may drink clear liquids until 04:30 AM the morning of your surgery.   Clear liquids allowed are: Water, Non-Citrus Juices (without pulp), Carbonated Beverages, Clear Tea, Black Coffee Only (NO MILK, CREAM OR POWDERED CREAMER of any kind), and Gatorade.    Take these medicines the morning of surgery with A SIP OF WATER  buPROPion (WELLBUTRIN XL)  carvedilol (COREG)  DULoxetine (CYMBALTA) ezetimibe (ZETIA) metoprolol succinate (TOPROL-XL)  pregabalin (LYRICA)  Vibegron (GEMTESA)   If needed: acetaminophen (TYLENOL) ondansetron (ZOFRAN-ODT)   As of today, STOP taking any Aspirin (unless otherwise instructed by your surgeon) Aleve, Naproxen, Ibuprofen, Motrin, Advil, Goody's, BC's, all herbal medications, fish oil, and all vitamins.   WHAT DO I DO ABOUT MY DIABETES MEDICATION?   Do not take metFORMIN (GLUCOPHAGE) the morning of surgery.    HOW TO MANAGE YOUR DIABETES BEFORE AND AFTER SURGERY  Why is it important to control my blood sugar before and after surgery? Improving blood sugar levels before and after surgery helps healing and can limit problems. A way of improving blood sugar control is eating a healthy diet by:  Eating less sugar and carbohydrates  Increasing activity/exercise  Talking with your doctor about reaching your blood sugar goals High blood sugars (greater than 180 mg/dL) can raise your risk of infections and slow your recovery, so you will need to focus on controlling your diabetes during the weeks before surgery. Make sure that the  doctor who takes care of your diabetes knows about your planned surgery including the date and location.  How do I manage my blood sugar before surgery? Check your blood sugar at least 4 times a day, starting 2 days before surgery, to make sure that the level is not too high or low.  Check your blood sugar the morning of your surgery when you wake up and every 2 hours until you get to the Short Stay unit.  If your blood sugar is less than 70 mg/dL, you will need to treat for low blood sugar: Do not take insulin. Treat a low blood sugar (less than 70 mg/dL) with  cup of clear juice (cranberry or apple), 4 glucose tablets, OR glucose gel. Recheck blood sugar in 15 minutes after treatment (to make sure it is greater than 70 mg/dL). If your blood sugar is not greater than 70 mg/dL on recheck, call [phone] for further instructions. Report your blood sugar to the short stay nurse when you get to Short Stay.  If you are admitted to the hospital after surgery: Your blood sugar will be checked by the staff and you will probably be given insulin after surgery (instead of oral diabetes medicines) to make sure you have good blood sugar levels. The goal for blood sugar control after surgery is 80-180 mg/dL.                    Do NOT Smoke (Tobacco/Vaping) for 24 hours prior to your procedure.  If you use a CPAP at night, you may bring your mask/headgear for your overnight  stay.   Contacts, glasses, piercing's, hearing aid's, dentures or partials may not be worn into surgery, please bring cases for these belongings.    For patients admitted to the hospital, discharge time will be determined by your treatment team.   Patients discharged the day of surgery will not be allowed to drive home, and someone needs to stay with them for 24 hours.  NO VISITORS WILL BE ALLOWED IN PRE-OP WHERE PATIENTS ARE PREPPED FOR SURGERY.  ONLY 1 SUPPORT PERSON MAY BE PRESENT IN THE WAITING ROOM WHILE YOU ARE IN SURGERY.   IF YOU ARE TO BE ADMITTED, ONCE YOU ARE IN YOUR ROOM YOU WILL BE ALLOWED TWO (2) VISITORS. (1) VISITOR MAY STAY OVERNIGHT BUT MUST ARRIVE TO THE ROOM BY 8pm.  Minor children may have two parents present. Special consideration for safety and communication needs will be reviewed on a case by case basis.   Special instructions:   [place]- Preparing For Surgery  Before surgery, you can play an important role. Because skin is not sterile, your skin needs to be as free of germs as possible. You can reduce the number of germs on your skin by washing with CHG (chlorahexidine gluconate) Soap before surgery.  CHG is an antiseptic cleaner which kills germs and bonds with the skin to continue killing germs even after washing.    Oral Hygiene is also important to reduce your risk of infection.  Remember - BRUSH YOUR TEETH THE MORNING OF SURGERY WITH YOUR REGULAR TOOTHPASTE  Please do not use if you have an allergy to CHG or antibacterial soaps. If your skin becomes reddened/irritated stop using the CHG.  Do not shave (including legs and underarms) for at least 48 hours prior to first CHG shower. It is OK to shave your face.  Please follow these instructions carefully.   Shower the NIGHT BEFORE SURGERY and the MORNING OF SURGERY  If you chose to wash your hair, wash your hair first as usual with your normal shampoo.  After you shampoo, rinse your hair and body thoroughly to remove the shampoo.  Use CHG Soap as you would any other liquid soap. You can apply CHG directly to the skin and wash gently with a scrungie or a clean washcloth.   Apply the CHG Soap to your body ONLY FROM THE NECK DOWN.  Do not use on open wounds or open sores. Avoid contact with your eyes, ears, mouth and genitals (private parts). Wash Face and genitals (private parts)  with your normal soap.   Wash thoroughly, paying special attention to the area where your surgery will be performed.  Thoroughly rinse your body with warm  water from the neck down.  DO NOT shower/wash with your normal soap after using and rinsing off the CHG Soap.  Pat yourself dry with a CLEAN TOWEL.  Wear CLEAN PAJAMAS to bed the night before surgery  Place CLEAN SHEETS on your bed the night before your surgery  DO NOT SLEEP WITH PETS.   Day of Surgery: Shower with CHG soap. Do not wear jewelry, make up, nail polish, gel polish, artificial nails, or any other type of covering on natural nails including finger and toenails. If patients have artificial nails, gel coating, etc. that need to be removed by a nail salon please have this removed prior to surgery. Surgery may need to be canceled/delayed if the surgeon/anesthesiologist feels like the patient is unable to be adequately monitored. Do not wear lotions, powders, perfumes, or deodorant. Do  not shave 48 hours prior to surgery.   Do not bring valuables to the hospital. [facility] is not responsible for any belongings or valuables. Wear Clean/Comfortable clothing the morning of surgery Remember to brush your teeth WITH YOUR REGULAR TOOTHPASTE.   Please read over the following fact sheets that you were given.   3 days prior to your procedure or After your COVID test   You are not required to quarantine however you are required to wear a well-fitting mask when you are out and around people not in your household. If your mask becomes wet or soiled, replace with a new one.   Wash your hands often with soap and water for 20 seconds or clean your hands with an alcohol-based hand sanitizer that contains at least 60% alcohol.   Do not share personal items.   Notify your provider:  o if you are in close contact with someone who has COVID  o or if you develop a fever of 100.4 or greater, sneezing, cough, sore throat, shortness of breath or body aches.

## 2022-01-30 ENCOUNTER — Other Ambulatory Visit: Payer: Self-pay

## 2022-01-30 ENCOUNTER — Encounter (HOSPITAL_COMMUNITY): Payer: Self-pay

## 2022-01-30 ENCOUNTER — Encounter (HOSPITAL_COMMUNITY)
Admission: RE | Admit: 2022-01-30 | Discharge: 2022-01-30 | Disposition: A | Discharge status: Home / Self Care | Payer: Medicare Other | Source: Ambulatory Visit | Attending: Plastic Surgery | Admitting: Plastic Surgery

## 2022-01-30 VITALS — BP 165/84 | HR 93 | Temp 98.2°F | Resp 17 | Ht 67.0 in | Wt 243.5 lb

## 2022-01-30 DIAGNOSIS — I252 Old myocardial infarction: Secondary | ICD-10-CM | POA: Insufficient documentation

## 2022-01-30 DIAGNOSIS — I2510 Atherosclerotic heart disease of native coronary artery without angina pectoris: Secondary | ICD-10-CM | POA: Insufficient documentation

## 2022-01-30 DIAGNOSIS — E785 Hyperlipidemia, unspecified: Secondary | ICD-10-CM | POA: Diagnosis not present

## 2022-01-30 DIAGNOSIS — Z01818 Encounter for other preprocedural examination: Secondary | ICD-10-CM

## 2022-01-30 DIAGNOSIS — E1142 Type 2 diabetes mellitus with diabetic polyneuropathy: Secondary | ICD-10-CM

## 2022-01-30 DIAGNOSIS — Z01812 Encounter for preprocedural laboratory examination: Secondary | ICD-10-CM | POA: Diagnosis not present

## 2022-01-30 DIAGNOSIS — I152 Hypertension secondary to endocrine disorders: Secondary | ICD-10-CM

## 2022-01-30 DIAGNOSIS — E1159 Type 2 diabetes mellitus with other circulatory complications: Secondary | ICD-10-CM | POA: Diagnosis not present

## 2022-01-30 HISTORY — DX: Headache, unspecified: R51.9

## 2022-01-30 LAB — BASIC METABOLIC PANEL
Anion gap: 10 (ref 5–15)
BUN: 15 mg/dL (ref 8–23)
CO2: 24 mmol/L (ref 22–32)
Calcium: 9.4 mg/dL (ref 8.9–10.3)
Chloride: 104 mmol/L (ref 98–111)
Creatinine, Ser: 0.75 mg/dL (ref 0.44–1.00)
GFR, Estimated: >60 mL/min (ref >60)
Glucose, Bld: 153 mg/dL — ABNORMAL HIGH (ref 70–99)
Potassium: 4.1 mmol/L (ref 3.5–5.1)
Sodium: 138 mmol/L (ref 135–145)

## 2022-01-30 LAB — CBC
HCT: 37.5 % (ref 36.0–46.0)
Hemoglobin: 11.5 g/dL — ABNORMAL LOW (ref 12.0–15.0)
MCH: 26.3 pg (ref 26.0–34.0)
MCHC: 30.7 g/dL (ref 30.0–36.0)
MCV: 85.6 fL (ref 80.0–100.0)
Platelets: 280 10*3/uL (ref 150–400)
RBC: 4.38 MIL/uL (ref 3.87–5.11)
RDW: 14.8 % (ref 11.5–15.5)
WBC: 5.6 10*3/uL (ref 4.0–10.5)
nRBC: 0 % (ref 0.0–0.2)

## 2022-01-30 LAB — HEMOGLOBIN A1C
Hgb A1c MFr Bld: 6.8 % — ABNORMAL HIGH (ref 4.8–5.6)
Mean Plasma Glucose: 148.46 mg/dL

## 2022-01-30 LAB — GLUCOSE, CAPILLARY: Glucose-Capillary: 155 mg/dL — ABNORMAL HIGH (ref 70–99)

## 2022-01-30 NOTE — Pre-Procedure Instructions (Signed)
Surgical Instructions    Your procedure is scheduled on Monday, March 6th.  Report to [facility] Main Entrance "A" at 05:30 A.M., then check in with the Admitting office.  Call this number if you have problems the morning of surgery:  [phone]   If you have any questions prior to your surgery date call [phone]: Open Monday-Friday 8am-4pm    Remember:  Do not eat or drink anything after midnight the night before your surgery    Take these medicines the morning of surgery with A SIP OF WATER  buPROPion (WELLBUTRIN XL)  carvedilol (COREG)  DULoxetine (CYMBALTA) ezetimibe (ZETIA) metoprolol succinate (TOPROL-XL)  pregabalin (LYRICA)  Vibegron (GEMTESA)   If needed: acetaminophen (TYLENOL) ondansetron (ZOFRAN-ODT)   As of today, STOP taking any Aspirin (unless otherwise instructed by your surgeon) Aleve, Naproxen, Ibuprofen, Motrin, Advil, Goody's, BC's, all herbal medications, fish oil, and all vitamins.   WHAT DO I DO ABOUT MY DIABETES MEDICATION?   Do not take metFORMIN (GLUCOPHAGE) the morning of surgery.    HOW TO MANAGE YOUR DIABETES BEFORE AND AFTER SURGERY  Why is it important to control my blood sugar before and after surgery? Improving blood sugar levels before and after surgery helps healing and can limit problems. A way of improving blood sugar control is eating a healthy diet by:  Eating less sugar and carbohydrates  Increasing activity/exercise  Talking with your doctor about reaching your blood sugar goals High blood sugars (greater than 180 mg/dL) can raise your risk of infections and slow your recovery, so you will need to focus on controlling your diabetes during the weeks before surgery. Make sure that the doctor who takes care of your diabetes knows about your planned surgery including the date and location.  How do I manage my blood sugar before surgery? Check your blood sugar at least 4 times a day, starting 2 days before surgery, to make  sure that the level is not too high or low.  Check your blood sugar the morning of your surgery when you wake up and every 2 hours until you get to the Short Stay unit.  If your blood sugar is less than 70 mg/dL, you will need to treat for low blood sugar: Do not take insulin. Treat a low blood sugar (less than 70 mg/dL) with  cup of clear juice (cranberry or apple), 4 glucose tablets, OR glucose gel. Recheck blood sugar in 15 minutes after treatment (to make sure it is greater than 70 mg/dL). If your blood sugar is not greater than 70 mg/dL on recheck, call [phone] for further instructions. Report your blood sugar to the short stay nurse when you get to Short Stay.  If you are admitted to the hospital after surgery: Your blood sugar will be checked by the staff and you will probably be given insulin after surgery (instead of oral diabetes medicines) to make sure you have good blood sugar levels. The goal for blood sugar control after surgery is 80-180 mg/dL.                    Do NOT Smoke (Tobacco/Vaping) for 24 hours prior to your procedure.  If you use a CPAP at night, you may bring your mask/headgear for your overnight stay.   Contacts, glasses, piercing's, hearing aid's, dentures or partials may not be worn into surgery, please bring cases for these belongings.    For patients admitted to the hospital, discharge time will be determined by your treatment  team.   Patients discharged the day of surgery will not be allowed to drive home, and someone needs to stay with them for 24 hours.  NO VISITORS WILL BE ALLOWED IN PRE-OP WHERE PATIENTS ARE PREPPED FOR SURGERY.  ONLY 1 SUPPORT PERSON MAY BE PRESENT IN THE WAITING ROOM WHILE YOU ARE IN SURGERY.  IF YOU ARE TO BE ADMITTED, ONCE YOU ARE IN YOUR ROOM YOU WILL BE ALLOWED TWO (2) VISITORS. (1) VISITOR MAY STAY OVERNIGHT BUT MUST ARRIVE TO THE ROOM BY 8pm.  Minor children may have two parents present. Special consideration for safety and  communication needs will be reviewed on a case by case basis.   Special instructions:   [place]- Preparing For Surgery  Before surgery, you can play an important role. Because skin is not sterile, your skin needs to be as free of germs as possible. You can reduce the number of germs on your skin by washing with CHG (chlorahexidine gluconate) Soap before surgery.  CHG is an antiseptic cleaner which kills germs and bonds with the skin to continue killing germs even after washing.    Oral Hygiene is also important to reduce your risk of infection.  Remember - BRUSH YOUR TEETH THE MORNING OF SURGERY WITH YOUR REGULAR TOOTHPASTE  Please do not use if you have an allergy to CHG or antibacterial soaps. If your skin becomes reddened/irritated stop using the CHG.  Do not shave (including legs and underarms) for at least 48 hours prior to first CHG shower. It is OK to shave your face.  Please follow these instructions carefully.   Shower the NIGHT BEFORE SURGERY and the MORNING OF SURGERY  If you chose to wash your hair, wash your hair first as usual with your normal shampoo.  After you shampoo, rinse your hair and body thoroughly to remove the shampoo.  Use CHG Soap as you would any other liquid soap. You can apply CHG directly to the skin and wash gently with a scrungie or a clean washcloth.   Apply the CHG Soap to your body ONLY FROM THE NECK DOWN.  Do not use on open wounds or open sores. Avoid contact with your eyes, ears, mouth and genitals (private parts). Wash Face and genitals (private parts)  with your normal soap.   Wash thoroughly, paying special attention to the area where your surgery will be performed.  Thoroughly rinse your body with warm water from the neck down.  DO NOT shower/wash with your normal soap after using and rinsing off the CHG Soap.  Pat yourself dry with a CLEAN TOWEL.  Wear CLEAN PAJAMAS to bed the night before surgery  Place CLEAN SHEETS on your bed the  night before your surgery  DO NOT SLEEP WITH PETS.   Day of Surgery: Shower with CHG soap. Do not wear jewelry, make up, nail polish, gel polish, artificial nails, or any other type of covering on natural nails including finger and toenails. If patients have artificial nails, gel coating, etc. that need to be removed by a nail salon please have this removed prior to surgery. Surgery may need to be canceled/delayed if the surgeon/anesthesiologist feels like the patient is unable to be adequately monitored. Do not wear lotions, powders, perfumes, or deodorant. Do not shave 48 hours prior to surgery.   Do not bring valuables to the hospital. [facility] is not responsible for any belongings or valuables. Wear Clean/Comfortable clothing the morning of surgery Remember to brush your teeth WITH YOUR  REGULAR TOOTHPASTE.   Please read over the following fact sheets that you were given.   3 days prior to your procedure or After your COVID test   You are not required to quarantine however you are required to wear a well-fitting mask when you are out and around people not in your household. If your mask becomes wet or soiled, replace with a new one.   Wash your hands often with soap and water for 20 seconds or clean your hands with an alcohol-based hand sanitizer that contains at least 60% alcohol.   Do not share personal items.   Notify your provider:  o if you are in close contact with someone who has COVID  o or if you develop a fever of 100.4 or greater, sneezing, cough, sore throat, shortness of breath or body aches.

## 2022-01-30 NOTE — Progress Notes (Addendum)
PCP - [name] Bucy BacigalupoMD Cardiologist - Dr. Serafina Royals  PPM/ICD -denies  Device Orders -  Rep Notified -   Chest x-ray -  EKG - 08/02/21 Stress Test - 2018 ECHO - 08/02/21 Cardiac Cath -11/04/13   Sleep Study - no CPAP -   Fasting Blood Sugar - 130's Checks Blood Sugar daily  Blood Thinner Instructions:n/a Aspirin Instructions:pt states last dose of Aspirin was 01/16/22 per Dr. Eusebio Friendly instructions.   ERAS Protcol -no PRE-SURGERY Ensure or G2-   COVID TEST- n/a -ambulatory surgery   Anesthesia review: yes -cardiac history  Patient denies shortness of breath, fever, cough and chest pain at PAT appointment   All instructions explained to the patient, with a verbal understanding of the material. Patient agrees to go over the instructions while at home for a better understanding. Patient also instructed to wear a mask when out in public prior to her surgery.The opportunity to ask questions was provided.

## 2022-01-31 NOTE — Progress Notes (Signed)
Anesthesia Chart Review:  Patient follows with cardiology at [facility] for history of HFrEF (EF 50% by echo 07/2021; up from 25 to 30% 2016), CAD, s/p PCI ~2010), HTN, HLD.  Last seen by Dr. Nehemiah Massed 08/02/2021 for preop clearance.  Per note, "The patient is planning to undergo a skin removal surgery after losing 160 pounds. She is doing remarkably well, but does have a significant cardiac history including prior MI with stent placement and heart failure with reduced ejection fraction. Fortunately she has no signs or symptoms of heart failure or cardiac ischemia and claims to be able to easily do 4 METS of exertion (easily climbs a flight of stairs and mows her own grass). 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 -No stress testing is required prior to surgery given that she is easily able to complete 4 METS of exertion without symptoms. -Need to complete echocardiogram prior to surgery. This is scheduled for 4 PM today. -Patient restarted on aspirin 81 mg indefinitely. This should be continued throughout the perioperative period. -Start metoprolol XL 25 mg daily. This should be continued throughout the perioperative period -Echocardiogram is needed to further risk stratify. The patient is likely moderate at risk for a major adverse cardiac event in the perioperative period, though there are no appreciable modifiable risk factors at this time."  Patient subsequently had echo 08/02/2021 showing EF 50% which is a marked improvement from her prior echo in 2016 showing EF 25 to 30%.  RV function was normal.  There were no significant valvular abnormalities.  Non-insulin-dependent DM2 well-controlled, A1c 6.8 on preop labs.  Preop labs reviewed, mild anemia with hemoglobin 11.5, otherwise unremarkable.  EKG 08/02/2021 (copy on chart): NSR.  Rate 79.  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  LVH with repol abnormality.  TTE 08/02/2021 (Care  Everywhere): INTERPRETATION  NORMAL LEFT VENTRICULAR SYSTOLIC FUNCTION  NORMAL RIGHT VENTRICULAR SYSTOLIC FUNCTION  NO VALVULAR REGURGITATION  NO VALVULAR STENOSIS  *POOR SOUND TRANSMISSION  ESTIMATED LVEF 50%  MILDLY DILATED LEFT VENTRICLE     [name] [facility] Short Stay Center/Anesthesiology Phone [phone] 01/31/2022 1:53 PM

## 2022-01-31 NOTE — Anesthesia Preprocedure Evaluation (Addendum)
Anesthesia Evaluation  ?Patient identified by MRN, date of birth, ID band ?Patient awake ? ? ? ?Reviewed: ?Allergy & Precautions, NPO status , Patient's Chart, lab work & pertinent test results ? ?Airway ?Mallampati: [name] ? ?TM Distance: <3 FB ?Neck ROM: Full ? ? ? Dental ?no notable dental hx. ? ?  ?Pulmonary ?neg pulmonary ROS, former smoker,  ?  ?Pulmonary exam normal ?breath sounds clear to auscultation ? ? ? ? ? ? Cardiovascular ?hypertension, + CAD and + Cardiac Stents  ?Normal cardiovascular exam ?Rhythm:Regular Rate:Normal ? ? ?  ?Neuro/Psych ?negative neurological ROS ? negative psych ROS  ? GI/Hepatic ?negative GI ROS, Neg liver ROS,   ?Endo/Other  ?diabetes, Type 2 ? Renal/GU ?negative Renal ROS  ?negative genitourinary ?  ?Musculoskeletal ?negative musculoskeletal ROS ?(+)  ? Abdominal ?  ?Peds ?negative pediatric ROS ?(+)  Hematology ? ?(+) Blood dyscrasia, anemia ,   ?Anesthesia Other Findings ? ? Reproductive/Obstetrics ?negative OB ROS ? ?  ? ? ? ? ? ? ? ? ? ? ? ? ? ?  ?  ? ? ? ? ? ? ?Anesthesia Physical ?Anesthesia Plan ? ?ASA: 3 ? ?Anesthesia Plan: General  ? ?Post-op Pain Management: Dilaudid IV  ? ?Induction: Intravenous ? ?PONV Risk Score and Plan: 3 and Ondansetron, Dexamethasone and Treatment may vary due to age or medical condition ? ?Airway Management Planned: Oral ETT and Video Laryngoscope Planned ? ?Additional Equipment:  ? ?Intra-op Plan:  ? ?Post-operative Plan: Extubation in OR ? ?Informed Consent: I have reviewed the patients History and Physical, chart, labs and discussed the procedure including the risks, benefits and alternatives for the proposed anesthesia with the patient or authorized representative who has indicated his/her understanding and acceptance.  ? ? ? ?Dental advisory given ? ?Plan Discussed with: CRNA and Surgeon ? ?Anesthesia Plan Comments: (PAT note by Karoline Caldwell, PA-C: ?Patient follows with cardiology at [facility] for history of HFrEF  (EF 50% by echo 07/2021; up from 25 to 30% 2016), CAD, s/p PCI ~2010), HTN, HLD.  Last seen by Dr. Nehemiah Massed 08/02/2021 for preop clearance.  Per note, "The patient is planning to undergo a skin removal surgery after losing 160 pounds. She is doing remarkably well, but does have a significant cardiac history including prior MI with stent placement and heart failure with reduced ejection fraction. Fortunately she has no signs or symptoms of heart failure or cardiac ischemia and claims to be able to easily do 4 METS of exertion (easily climbs a flight of stairs and mows her own grass). 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 -No stress testing is required prior to surgery given that she is easily able to complete 4 METS of exertion without symptoms. -Need to complete echocardiogram prior to surgery. This is scheduled for 4 PM today. -Patient restarted on aspirin 81 mg indefinitely. This should be continued throughout the perioperative period. -Start metoprolol XL 25 mg daily. This should be continued throughout the perioperative period -Echocardiogram is needed to further risk stratify. The patient is likely moderate at risk for a major adverse cardiac event in the perioperative period, though there are no appreciable modifiable risk factors at this time."  Patient subsequently had echo 08/02/2021 showing EF 50% which is a marked improvement from her prior echo in 2016 showing EF 25 to 30%.  RV function was normal.  There were no significant valvular abnormalities. ? ?Non-insulin-dependent DM2 well-controlled, A1c 6.8 on preop labs. ? ?Preop labs reviewed, mild  anemia with hemoglobin 11.5, otherwise unremarkable. ? ?EKG 08/02/2021 (copy on chart): NSR.  Rate 79.  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  LVH with repol abnormality. ? ?TTE 08/02/2021 (Care Everywhere): ?INTERPRETATION  ?NORMAL LEFT VENTRICULAR SYSTOLIC FUNCTION  ?NORMAL RIGHT VENTRICULAR SYSTOLIC  FUNCTION  ?NO VALVULAR REGURGITATION  ?NO VALVULAR STENOSIS  ?*POOR SOUND TRANSMISSION  ?ESTIMATED LVEF 50%  ?MILDLY DILATED LEFT VENTRICLE  ? ?)  ? ? ? ? ?Anesthesia Quick Evaluation ? ?

## 2022-02-05 ENCOUNTER — Other Ambulatory Visit: Payer: Self-pay

## 2022-02-05 ENCOUNTER — Ambulatory Visit (HOSPITAL_COMMUNITY): Payer: Medicare Other | Admitting: Physician Assistant

## 2022-02-05 ENCOUNTER — Ambulatory Visit (HOSPITAL_BASED_OUTPATIENT_CLINIC_OR_DEPARTMENT_OTHER): Payer: Medicare Other | Admitting: Registered Nurse

## 2022-02-05 ENCOUNTER — Encounter (HOSPITAL_COMMUNITY)
Admission: RE | Disposition: A | Discharge status: Home / Self Care | Payer: Self-pay | Source: Home / Self Care | Attending: Plastic Surgery

## 2022-02-05 ENCOUNTER — Ambulatory Visit (HOSPITAL_COMMUNITY)
Admission: RE | Admit: 2022-02-05 | Discharge: 2022-02-05 | Disposition: A | Discharge status: Home / Self Care | Payer: Medicare Other | Attending: Plastic Surgery | Admitting: Plastic Surgery

## 2022-02-05 ENCOUNTER — Encounter (HOSPITAL_COMMUNITY): Payer: Self-pay | Admitting: Plastic Surgery

## 2022-02-05 DIAGNOSIS — G8929 Other chronic pain: Secondary | ICD-10-CM | POA: Diagnosis not present

## 2022-02-05 DIAGNOSIS — M793 Panniculitis, unspecified: Secondary | ICD-10-CM

## 2022-02-05 DIAGNOSIS — I10 Essential (primary) hypertension: Secondary | ICD-10-CM | POA: Diagnosis not present

## 2022-02-05 DIAGNOSIS — I110 Hypertensive heart disease with heart failure: Secondary | ICD-10-CM | POA: Insufficient documentation

## 2022-02-05 DIAGNOSIS — Z882 Allergy status to sulfonamides status: Secondary | ICD-10-CM | POA: Insufficient documentation

## 2022-02-05 DIAGNOSIS — Z87891 Personal history of nicotine dependence: Secondary | ICD-10-CM | POA: Insufficient documentation

## 2022-02-05 DIAGNOSIS — Z955 Presence of coronary angioplasty implant and graft: Secondary | ICD-10-CM | POA: Diagnosis not present

## 2022-02-05 DIAGNOSIS — E785 Hyperlipidemia, unspecified: Secondary | ICD-10-CM | POA: Diagnosis not present

## 2022-02-05 DIAGNOSIS — Z91040 Latex allergy status: Secondary | ICD-10-CM | POA: Diagnosis not present

## 2022-02-05 DIAGNOSIS — M5442 Lumbago with sciatica, left side: Secondary | ICD-10-CM | POA: Insufficient documentation

## 2022-02-05 DIAGNOSIS — E119 Type 2 diabetes mellitus without complications: Secondary | ICD-10-CM

## 2022-02-05 DIAGNOSIS — I2510 Atherosclerotic heart disease of native coronary artery without angina pectoris: Secondary | ICD-10-CM | POA: Diagnosis not present

## 2022-02-05 DIAGNOSIS — M797 Fibromyalgia: Secondary | ICD-10-CM | POA: Diagnosis not present

## 2022-02-05 DIAGNOSIS — K3189 Other diseases of stomach and duodenum: Secondary | ICD-10-CM | POA: Insufficient documentation

## 2022-02-05 DIAGNOSIS — I509 Heart failure, unspecified: Secondary | ICD-10-CM | POA: Diagnosis not present

## 2022-02-05 DIAGNOSIS — M5441 Lumbago with sciatica, right side: Secondary | ICD-10-CM | POA: Diagnosis not present

## 2022-02-05 DIAGNOSIS — L304 Erythema intertrigo: Secondary | ICD-10-CM | POA: Insufficient documentation

## 2022-02-05 HISTORY — PX: PANNICULECTOMY: SHX5360

## 2022-02-05 LAB — GLUCOSE, CAPILLARY
Glucose-Capillary: 130 mg/dL — ABNORMAL HIGH (ref 70–99)
Glucose-Capillary: 138 mg/dL — ABNORMAL HIGH (ref 70–99)
Glucose-Capillary: 148 mg/dL — ABNORMAL HIGH (ref 70–99)
Glucose-Capillary: 114 mg/dL — ABNORMAL HIGH (ref 70–99)

## 2022-02-05 SURGERY — PANNICULECTOMY
Anesthesia: General | Site: Abdomen

## 2022-02-05 MED ORDER — HYDRALAZINE HCL 20 MG/ML IJ SOLN
10.0000 mg | Freq: Once | INTRAMUSCULAR | Status: AC
  Administered 2022-02-05: 10 mg via INTRAVENOUS

## 2022-02-05 MED ORDER — LIDOCAINE-EPINEPHRINE 1 %-1:100000 IJ SOLN
INTRAMUSCULAR | Status: AC
  Filled 2022-02-05: qty 1

## 2022-02-05 MED ORDER — FENTANYL CITRATE (PF) 250 MCG/5ML IJ SOLN
INTRAMUSCULAR | Status: DC | PRN
  Administered 2022-02-05: 100 ug via INTRAVENOUS
  Administered 2022-02-05 (×3): 50 ug via INTRAVENOUS

## 2022-02-05 MED ORDER — LIDOCAINE 2% (20 MG/ML) 5 ML SYRINGE
INTRAMUSCULAR | Status: DC | PRN
  Administered 2022-02-05: 100 mg via INTRAVENOUS

## 2022-02-05 MED ORDER — SUCCINYLCHOLINE CHLORIDE 200 MG/10ML IV SOSY
PREFILLED_SYRINGE | INTRAVENOUS | Status: AC
  Filled 2022-02-05: qty 10

## 2022-02-05 MED ORDER — OXYCODONE HCL 5 MG PO TABS
ORAL_TABLET | ORAL | Status: AC
  Filled 2022-02-05: qty 2

## 2022-02-05 MED ORDER — HYDRALAZINE HCL 20 MG/ML IJ SOLN
INTRAMUSCULAR | Status: AC
  Filled 2022-02-05: qty 1

## 2022-02-05 MED ORDER — HYDROMORPHONE HCL 1 MG/ML IJ SOLN
0.2500 mg | INTRAMUSCULAR | Status: DC | PRN
  Administered 2022-02-05: 0.5 mg via INTRAVENOUS
  Administered 2022-02-05: 0.25 mg via INTRAVENOUS

## 2022-02-05 MED ORDER — LACTATED RINGERS IV SOLN: INTRAVENOUS | Status: DC | PRN

## 2022-02-05 MED ORDER — CEFAZOLIN SODIUM-DEXTROSE 2-4 GM/100ML-% IV SOLN
2.0000 g | INTRAVENOUS | Status: AC
  Administered 2022-02-05: 2 g via INTRAVENOUS

## 2022-02-05 MED ORDER — SUGAMMADEX SODIUM 200 MG/2ML IV SOLN
INTRAVENOUS | Status: DC | PRN
  Administered 2022-02-05: 200 mg via INTRAVENOUS

## 2022-02-05 MED ORDER — ORAL CARE MOUTH RINSE: 15.0000 mL | Freq: Once | OROMUCOSAL | Status: AC

## 2022-02-05 MED ORDER — HYDROMORPHONE HCL 1 MG/ML IJ SOLN
INTRAMUSCULAR | Status: DC | PRN
  Administered 2022-02-05: .5 mg via INTRAVENOUS
  Administered 2022-02-05 (×2): .25 mg via INTRAVENOUS

## 2022-02-05 MED ORDER — ACETAMINOPHEN 650 MG RE SUPP: 650.0000 mg | RECTAL | Status: DC | PRN

## 2022-02-05 MED ORDER — FENTANYL CITRATE (PF) 250 MCG/5ML IJ SOLN
INTRAMUSCULAR | Status: AC
  Filled 2022-02-05: qty 5

## 2022-02-05 MED ORDER — ACETAMINOPHEN 10 MG/ML IV SOLN
INTRAVENOUS | Status: AC
  Filled 2022-02-05: qty 100

## 2022-02-05 MED ORDER — VISTASEAL 10 ML SINGLE DOSE KIT
10.0000 mL | PACK | Freq: Once | CUTANEOUS | Status: AC
  Administered 2022-02-05: 10 mL via TOPICAL

## 2022-02-05 MED ORDER — ROCURONIUM BROMIDE 10 MG/ML (PF) SYRINGE
PREFILLED_SYRINGE | INTRAVENOUS | Status: AC
  Filled 2022-02-05: qty 10

## 2022-02-05 MED ORDER — 0.9 % SODIUM CHLORIDE (POUR BTL) OPTIME
TOPICAL | Status: DC | PRN
  Administered 2022-02-05: 1000 mL

## 2022-02-05 MED ORDER — ONDANSETRON HCL 4 MG/2ML IJ SOLN: 4.0000 mg | Freq: Once | INTRAMUSCULAR | Status: DC | PRN

## 2022-02-05 MED ORDER — LIDOCAINE 2% (20 MG/ML) 5 ML SYRINGE
INTRAMUSCULAR | Status: AC
  Filled 2022-02-05: qty 5

## 2022-02-05 MED ORDER — ONDANSETRON HCL 4 MG/2ML IJ SOLN
INTRAMUSCULAR | Status: AC
  Filled 2022-02-05: qty 2

## 2022-02-05 MED ORDER — LIDOCAINE HCL 1 % IJ SOLN
INTRAVENOUS | Status: DC
  Filled 2022-02-05: qty 50

## 2022-02-05 MED ORDER — BUPIVACAINE HCL (PF) 0.25 % IJ SOLN
INTRAMUSCULAR | Status: AC
  Filled 2022-02-05: qty 30

## 2022-02-05 MED ORDER — CEFAZOLIN SODIUM-DEXTROSE 2-4 GM/100ML-% IV SOLN
INTRAVENOUS | Status: AC
  Filled 2022-02-05: qty 100

## 2022-02-05 MED ORDER — SODIUM CHLORIDE 0.9% FLUSH: 3.0000 mL | INTRAVENOUS | Status: DC | PRN

## 2022-02-05 MED ORDER — MIDAZOLAM HCL 2 MG/2ML IJ SOLN
INTRAMUSCULAR | Status: DC | PRN
Start: 2022-02-05 — End: 2022-02-05
  Administered 2022-02-05: 2 mg via INTRAVENOUS

## 2022-02-05 MED ORDER — ACETAMINOPHEN 10 MG/ML IV SOLN: 1000.0000 mg | Freq: Once | INTRAVENOUS | Status: DC | PRN

## 2022-02-05 MED ORDER — GLYCOPYRROLATE 0.2 MG/ML IJ SOLN
INTRAMUSCULAR | Status: DC | PRN
  Administered 2022-02-05: .2 mg via INTRAVENOUS

## 2022-02-05 MED ORDER — PROPOFOL 10 MG/ML IV BOLUS
INTRAVENOUS | Status: AC
  Filled 2022-02-05: qty 20

## 2022-02-05 MED ORDER — ACETAMINOPHEN 10 MG/ML IV SOLN
INTRAVENOUS | Status: DC | PRN
  Administered 2022-02-05: 1000 mg via INTRAVENOUS

## 2022-02-05 MED ORDER — GLYCOPYRROLATE PF 0.2 MG/ML IJ SOSY
PREFILLED_SYRINGE | INTRAMUSCULAR | Status: AC
  Filled 2022-02-05: qty 1

## 2022-02-05 MED ORDER — PROPOFOL 10 MG/ML IV BOLUS
INTRAVENOUS | Status: DC | PRN
  Administered 2022-02-05: 40 mg via INTRAVENOUS
  Administered 2022-02-05: 120 mg via INTRAVENOUS
  Administered 2022-02-05: 20 mg via INTRAVENOUS

## 2022-02-05 MED ORDER — OXYCODONE HCL 5 MG PO TABS
5.0000 mg | ORAL_TABLET | ORAL | Status: DC | PRN
  Administered 2022-02-05: 10 mg via ORAL

## 2022-02-05 MED ORDER — HYDROMORPHONE HCL 1 MG/ML IJ SOLN
INTRAMUSCULAR | Status: AC
  Filled 2022-02-05: qty 0.5

## 2022-02-05 MED ORDER — ROCURONIUM BROMIDE 10 MG/ML (PF) SYRINGE
PREFILLED_SYRINGE | INTRAVENOUS | Status: DC | PRN
  Administered 2022-02-05: 10 mg via INTRAVENOUS
  Administered 2022-02-05: 50 mg via INTRAVENOUS
  Administered 2022-02-05: 10 mg via INTRAVENOUS

## 2022-02-05 MED ORDER — EPINEPHRINE PF 1 MG/ML IJ SOLN
Freq: Once | INTRAMUSCULAR | Status: AC
Start: 2022-02-05 — End: 2022-02-05
  Administered 2022-02-05: 1000 mL via INTRAMUSCULAR
  Filled 2022-02-05: qty 50

## 2022-02-05 MED ORDER — SODIUM CHLORIDE 0.9 % IV SOLN: 250.0000 mL | INTRAVENOUS | Status: DC | PRN

## 2022-02-05 MED ORDER — DEXMEDETOMIDINE (PRECEDEX) IN NS 20 MCG/5ML (4 MCG/ML) IV SYRINGE
PREFILLED_SYRINGE | INTRAVENOUS | Status: DC | PRN
  Administered 2022-02-05 (×2): 8 ug via INTRAVENOUS
  Administered 2022-02-05: 4 ug via INTRAVENOUS

## 2022-02-05 MED ORDER — INSULIN ASPART 100 UNIT/ML IJ SOLN: 0.0000 [IU] | INTRAMUSCULAR | Status: DC | PRN

## 2022-02-05 MED ORDER — MIDAZOLAM HCL 2 MG/2ML IJ SOLN
INTRAMUSCULAR | Status: AC
  Filled 2022-02-05: qty 2

## 2022-02-05 MED ORDER — SUCCINYLCHOLINE CHLORIDE 200 MG/10ML IV SOSY
PREFILLED_SYRINGE | INTRAVENOUS | Status: DC | PRN
  Administered 2022-02-05: 150 mg via INTRAVENOUS

## 2022-02-05 MED ORDER — PHENYLEPHRINE HCL (PRESSORS) 10 MG/ML IV SOLN
INTRAVENOUS | Status: DC | PRN
  Administered 2022-02-05: 80 ug via INTRAVENOUS
  Administered 2022-02-05: 40 ug via INTRAVENOUS

## 2022-02-05 MED ORDER — DEXAMETHASONE SODIUM PHOSPHATE 10 MG/ML IJ SOLN
INTRAMUSCULAR | Status: AC
  Filled 2022-02-05: qty 1

## 2022-02-05 MED ORDER — LACTATED RINGERS IV SOLN: INTRAVENOUS | Status: DC

## 2022-02-05 MED ORDER — CHLORHEXIDINE GLUCONATE CLOTH 2 % EX PADS: 6.0000 | MEDICATED_PAD | Freq: Once | CUTANEOUS | Status: DC

## 2022-02-05 MED ORDER — CHLORHEXIDINE GLUCONATE 0.12 % MT SOLN
15.0000 mL | Freq: Once | OROMUCOSAL | Status: AC
  Administered 2022-02-05: 15 mL via OROMUCOSAL

## 2022-02-05 MED ORDER — KETOROLAC TROMETHAMINE 30 MG/ML IJ SOLN
INTRAMUSCULAR | Status: AC
  Filled 2022-02-05: qty 1

## 2022-02-05 MED ORDER — KETOROLAC TROMETHAMINE 30 MG/ML IJ SOLN
30.0000 mg | Freq: Once | INTRAMUSCULAR | Status: AC
  Administered 2022-02-05: 30 mg via INTRAVENOUS

## 2022-02-05 MED ORDER — DEXMEDETOMIDINE (PRECEDEX) IN NS 20 MCG/5ML (4 MCG/ML) IV SYRINGE
PREFILLED_SYRINGE | INTRAVENOUS | Status: AC
  Filled 2022-02-05: qty 5

## 2022-02-05 MED ORDER — HEMOSTATIC AGENTS (NO CHARGE) OPTIME
TOPICAL | Status: DC | PRN
  Administered 2022-02-05 (×2): 1 via TOPICAL

## 2022-02-05 MED ORDER — LIDOCAINE HCL 1 % IJ SOLN
Freq: Once | INTRAVENOUS | Status: DC
  Filled 2022-02-05 (×4): qty 50

## 2022-02-05 MED ORDER — ONDANSETRON HCL 4 MG/2ML IJ SOLN
INTRAMUSCULAR | Status: DC | PRN
  Administered 2022-02-05 (×2): 4 mg via INTRAVENOUS

## 2022-02-05 MED ORDER — CHLORHEXIDINE GLUCONATE 0.12 % MT SOLN
OROMUCOSAL | Status: AC
  Filled 2022-02-05: qty 15

## 2022-02-05 MED ORDER — ACETAMINOPHEN 325 MG PO TABS: 650.0000 mg | ORAL_TABLET | ORAL | Status: DC | PRN

## 2022-02-05 MED ORDER — SODIUM CHLORIDE 0.9% FLUSH: 3.0000 mL | Freq: Two times a day (BID) | INTRAVENOUS | Status: DC

## 2022-02-05 MED ORDER — FENTANYL CITRATE (PF) 100 MCG/2ML IJ SOLN: 25.0000 ug | INTRAMUSCULAR | Status: DC | PRN

## 2022-02-05 MED ORDER — HYDROMORPHONE HCL 1 MG/ML IJ SOLN
INTRAMUSCULAR | Status: AC
  Filled 2022-02-05: qty 1

## 2022-02-05 SURGICAL SUPPLY — 53 items
APPLIER CLIP 9.375 MED OPEN (MISCELLANEOUS) ×4
BAG COUNTER SPONGE SURGICOUNT (BAG) ×2 IMPLANT
BINDER ABDOMINAL 10 UNV 27-48 (MISCELLANEOUS) IMPLANT
BINDER ABDOMINAL 12 SM 30-45 (SOFTGOODS) ×1 IMPLANT
BINDER ABDOMINAL 12 XL 75-84 (SOFTGOODS) ×2 IMPLANT
BIOPATCH RED 1 DISK 7.0 (GAUZE/BANDAGES/DRESSINGS) ×2 IMPLANT
CLIP APPLIE 9.375 MED OPEN (MISCELLANEOUS) IMPLANT
DERMABOND ADVANCED (GAUZE/BANDAGES/DRESSINGS) ×1
DERMABOND ADVANCED .7 DNX12 (GAUZE/BANDAGES/DRESSINGS) ×2 IMPLANT
DRAIN CHANNEL 19F RND (DRAIN) ×2 IMPLANT
DRAPE HALF SHEET 40X57 (DRAPES) ×4 IMPLANT
DRAPE INCISE IOBAN 66X45 STRL (DRAPES) ×2 IMPLANT
DRESSING PREVENA PLUS CUSTOM (GAUZE/BANDAGES/DRESSINGS) IMPLANT
DRSG PREVENA PLUS CUSTOM (GAUZE/BANDAGES/DRESSINGS) ×2
ELECT BLADE 4.0 EZ CLEAN MEGAD (MISCELLANEOUS) ×2
ELECT REM PT RETURN 9FT ADLT (ELECTROSURGICAL) ×2
ELECTRODE BLDE 4.0 EZ CLN MEGD (MISCELLANEOUS) ×1 IMPLANT
ELECTRODE REM PT RTRN 9FT ADLT (ELECTROSURGICAL) ×1 IMPLANT
EVACUATOR SILICONE 100CC (DRAIN) ×2 IMPLANT
GLOVE SURG ENC MOIS LTX SZ6.5 (GLOVE) ×6 IMPLANT
GLOVE SURG ENC MOIS LTX SZ7.5 (GLOVE) ×4 IMPLANT
GOWN STRL REUS W/ TWL LRG LVL3 (GOWN DISPOSABLE) ×2 IMPLANT
GOWN STRL REUS W/TWL LRG LVL3 (GOWN DISPOSABLE) ×2
HEMOSTAT HEMOBLAST BELLOWS (HEMOSTASIS) ×2 IMPLANT
KIT BASIN OR (CUSTOM PROCEDURE TRAY) ×2 IMPLANT
NDL HYPO 25X1 1.5 SAFETY (NEEDLE) ×1 IMPLANT
NDL SPNL 18GX3.5 QUINCKE PK (NEEDLE) IMPLANT
NEEDLE HYPO 25X1 1.5 SAFETY (NEEDLE) ×2 IMPLANT
NEEDLE SPNL 18GX3.5 QUINCKE PK (NEEDLE) ×2 IMPLANT
NS IRRIG 1000ML POUR BTL (IV SOLUTION) ×2 IMPLANT
PACK GENERAL/GYN (CUSTOM PROCEDURE TRAY) ×2 IMPLANT
PACK UNIVERSAL I (CUSTOM PROCEDURE TRAY) ×2 IMPLANT
PENCIL SMOKE EVACUATOR (MISCELLANEOUS) ×2 IMPLANT
PIN SAFETY STERILE (MISCELLANEOUS) ×1 IMPLANT
SLEEVE SCD COMPRESS KNEE MED (STOCKING) ×2 IMPLANT
SPONGE T-LAP 18X18 ~~LOC~~+RFID (SPONGE) ×2 IMPLANT
STAPLER VISISTAT 35W (STAPLE) ×2 IMPLANT
SUT MNCRL AB 4-0 PS2 18 (SUTURE) ×10 IMPLANT
SUT MON AB 3-0 SH 27 (SUTURE) ×3
SUT MON AB 3-0 SH27 (SUTURE) ×2 IMPLANT
SUT MON AB 5-0 PS2 18 (SUTURE) ×4 IMPLANT
SUT PDS AB 2-0 CT1 27 (SUTURE) ×4 IMPLANT
SUT PDS AB 3-0 SH 27 (SUTURE) ×6 IMPLANT
SUT SILK 3 0 SH 30 (SUTURE) IMPLANT
SUT VIC AB 3-0 SH 27 (SUTURE) ×1
SUT VIC AB 3-0 SH 27X BRD (SUTURE) ×1 IMPLANT
SUT VICRYL 4-0 PS2 18IN ABS (SUTURE) ×6 IMPLANT
SYR CONTROL 10ML LL (SYRINGE) ×2 IMPLANT
TOWEL GREEN STERILE FF (TOWEL DISPOSABLE) ×4 IMPLANT
TRAY FOLEY W/BAG SLVR 16FR (SET/KITS/TRAYS/PACK)
TRAY FOLEY W/BAG SLVR 16FR ST (SET/KITS/TRAYS/PACK) IMPLANT
TUBING INFILTRATION IT-10001 (TUBING) ×1 IMPLANT
UNDERPAD 30X36 HEAVY ABSORB (UNDERPADS AND DIAPERS) ×4 IMPLANT

## 2022-02-05 NOTE — Transfer of Care (Signed)
Immediate Anesthesia Transfer of Care Note ? ?Patient: SHADIA LAROSE ? ?Procedure(s) Performed: PANNICULECTOMY (Abdomen) ? ?Patient Location: PACU ? ?Anesthesia Type:General ? ?Level of Consciousness: awake, alert , oriented and patient cooperative ? ?Airway & Oxygen Therapy: Patient Spontanous Breathing and Patient connected to face mask oxygen ? ?Post-op Assessment: Report given to RN, Post -op Vital signs reviewed and stable and Patient moving all extremities X 4 ? ?Post vital signs: Reviewed and stable ? ?Last Vitals:  ?Vitals Value Taken Time  ?BP 186/97 02/05/22 1032  ?Temp    ?Pulse 90 02/05/22 1034  ?Resp 14 02/05/22 1034  ?SpO2 100 % 02/05/22 1034  ?Vitals shown include unvalidated device data. ? ?Last Pain:  ?Vitals:  ? 02/05/22 0646  ?TempSrc:   ?PainSc: 0-No pain  ?   ? ?  ? ?Complications: No notable events documented. ?

## 2022-02-05 NOTE — Discharge Instructions (Signed)
INSTRUCTIONS FOR AFTER ABDOMINAL SURGERY  You will likely have some questions about what to expect following your operation.  The following information will help you and your family understand what to expect when you get home.  Following these guidelines will help ensure a smooth recovery and reduce risks of complications.  Postoperative instructions include information on: diet, wound care, medications and physical activity.  AFTER SURGERY Expect to go home after the procedure.  In some cases, you may need to spend one night in the hospital for observation.  DIET This surgery does not require a specific diet.  However, the healthier you eat the better your body can heal. It is important to increasing your protein intake.  Limit foods with high sugar and  carbohydrate content.  Focus on vegetables, meat and other protein sources if you are vegan or vegetarian.  If you undergo liposuction during your procedure it is very important to drink 8 oz of water every hour while awake for 2 days.  If your urine is bright yellow, then it is concentrated, and you need to drink more water.  If you find you are persistently nauseated or unable to take in liquids let us know.  NO TOBACCO USE or EXPOSURE.  This will slow your healing process and increase the risk of a wound.  WOUND CARE Leave the abdominal binder in place for 3 days.  Then you can remove it and shower.  Replace the binder or spanx after your shower.   You may have Topifoam or Lipofoam on.  It is soft and spongy and helps keep you from getting creases if you have liposuction.  This can be removed before the shower and then replaced.  If you need more it is available on [employer] as lipofoam.  If you have steri-strips / tape directly attached to your skin leave them in place. It is OK to get these wet.  No baths, pools or hot tubs for four weeks. We close your incision to leave the smallest and best-looking scar. No ointment or creams on your incisions  until cleared by your surgeon.  No Neosporin (Too many skin reactions with this one).  After the steri-strips are off can use Mederma or Skinuva and start massaging the scar. Continue to wear the binder/spanx or Ace wrap around the clock, including while sleeping, for 6 weeks. This provides added comfort and helps reduce the fluid accumulation at the surgery site.  ACTIVITY No heavy lifting until cleared by the doctor.  For example, no more than a half-gallon of milk.  It is OK to walk and you are encouraged to move your legs to help decrease your risk of getting a blood clot.  It will also help keep you from getting deconditioned.  Every 1 to 2 hours get up and walk for 5 minutes. This will help with a quicker recovery back to normal.  Let pain be your guide so you don't do too much.     SLEEPING / RESTING Sleeping and resting should be in the jack-knife or bent forward position with your head elevated.  This will help reduce pulling on your abdominal incision.  You can elevate your head and upper back with a few pillows and place a pillow under your knees.  Avoid stomach sleeping for 3 months.   WORK Everyone returns to work at different times. As a rough guide, most people take 1 - 2 weeks off prior to returning to work. If you need documentation for your   job give them to the front staff for processing.  DRIVING Arrange for someone to bring you home from the hospital.  You may be able to drive a few days after surgery but not while taking any narcotics or valium.  This is for your safety as well as others sharing the road with you.  BOWEL MOVEMENTS Constipation can occur after anesthesia and while taking pain medication.  It is important to stay ahead for your comfort.  We recommend taking Milk of Magnesia (2 tablespoons; twice a day) while taking the pain pills.  MEDICATIONS (you may receive and should be started after surgery) At your preoperative visit for you history and physical you were  given the following medications: Antibiotic: Start this medication when you get home and take according to the instructions on the bottle. Zofran 4 mg:  This is to treat nausea and vomiting.  You can take this every 6 hours as needed and only if needed. Norco (hydrocodone/acetaminophen) 5/325 mg:  This is only to be used after you have taken the Motrin or the Tylenol. Every 8 hours as needed.  Over the counter Medication to take: Ibuprofen (Motrin) 600 mg:  Take this every 6 hours.  If you have additional pain then take 500 mg of the Tylenol.  Only take the Norco after you have tried these two. MiraLAX or stool softener of choice: Take this according to the bottle if you take the Norco.  WHEN TO CALL Call your surgeon's office if any of the following occur:  Fever 101 degrees F or greater  Excessive bleeding or fluid from the incision site.  Pain that increases over time without aid from the medications  Redness, warmth, or pus draining from incision sites  Persistent nausea or inability to take in liquids  Severe misshapen area that underwent the operation.   

## 2022-02-05 NOTE — Anesthesia Procedure Notes (Signed)
Procedure Name: Intubation ?Date/Time: 02/05/2022 7:55 AM ?Performed by: Gayland Curry, CRNA ?Pre-anesthesia Checklist: Patient identified, Emergency Drugs available, Suction available and Patient being monitored ?Patient Re-evaluated:Patient Re-evaluated prior to induction ?Oxygen Delivery Method: Circle system utilized ?Preoxygenation: Pre-oxygenation with 100% oxygen ?Induction Type: IV induction ?Ventilation: Mask ventilation without difficulty and Oral airway inserted - appropriate to patient size ?Laryngoscope Size: Glidescope and 3 ?Tube type: Oral ?Tube size: 7.0 mm ?Number of attempts: 1 ?Airway Equipment and Method: Video-laryngoscopy and Stylet ?Placement Confirmation: ETT inserted through vocal cords under direct vision, positive ETCO2 and breath sounds checked- equal and bilateral ?Secured at: 22 cm ?Tube secured with: Tape ?Dental Injury: Teeth and Oropharynx as per pre-operative assessment  ? ? ? ? ?

## 2022-02-05 NOTE — Interval H&P Note (Signed)
History and Physical Interval Note: ? ?02/05/2022 ?7:36 AM ? ?[name]  has presented today for surgery, with the diagnosis of Morbid Obesity.  The various methods of treatment have been discussed with the patient and family. After consideration of risks, benefits and other options for treatment, the patient has consented to  Procedure(s) with comments: ?PANNICULECTOMY (N/A) - 3 hours as a surgical intervention.  The patient's history has been reviewed, patient examined, no change in status, stable for surgery.  I have reviewed the patient's chart and labs.  Questions were answered to the patient's satisfaction.   ? ? ?[name] [name] ? ? ?

## 2022-02-05 NOTE — Anesthesia Postprocedure Evaluation (Signed)
Anesthesia Post Note ? ?Patient: OLENE GODFREY ? ?Procedure(s) Performed: PANNICULECTOMY (Abdomen) ? ?  ? ?Patient location during evaluation: PACU ?Anesthesia Type: General ?Level of consciousness: awake and alert ?Pain management: pain level controlled ?Vital Signs Assessment: post-procedure vital signs reviewed and stable ?Respiratory status: spontaneous breathing, nonlabored ventilation, respiratory function stable and patient connected to nasal cannula oxygen ?Cardiovascular status: blood pressure returned to baseline and stable ?Postop Assessment: no apparent nausea or vomiting ?Anesthetic complications: no ? ? ?No notable events documented. ? ?Last Vitals:  ?Vitals:  ? 02/05/22 1200 02/05/22 1201  ?BP:  (!) 148/74  ?Pulse: 99 100  ?Resp: 13 19  ?Temp:  36.5 ?C  ?SpO2: 94% 92%  ?  ?Last Pain:  ?Vitals:  ? 02/05/22 1132  ?TempSrc:   ?PainSc: 7   ? ? ?  ?  ?  ?  ?  ?  ? ?Leda Bellefeuille S ? ? ? ? ?

## 2022-02-05 NOTE — Progress Notes (Signed)
Discharge teaching completed with daughter and sister. Discharge instructions sent home with daughter. Patient ambulated and voided prior to discharge. JP drains emptied. Wound vac didn't have any drainage. Told daughter and patient to call Dr. Elenor Quinones office with any questions/concerns that arise before post-op appointment. ? ?[name] [name], [name] Dolly, RN ? ?

## 2022-02-05 NOTE — Op Note (Addendum)
Operative Report ? ?Date of operation: 02/05/2022 ? ?Patient: [name] [name], MRN: [id number], 71 y.o. female.   ?Date of birth: Apr 06, 1951 ? ?Location: [facility] ? ?Preoperative Diagnosis: Panniculitis ? ?Postoperative Diagnosis:  Same ? ?Procedure: Panniculectomy (4200 gm) ? ?Surgeon:  Theodoro Kos Erika Hussar ? ?Assistant:  Roetta Sessions, PA ? ?Anesthesia:  General ? ?EBL:  150cc ? ?Drains:  2 number 19 blake round drains and Prevena VAC ? ?Condition:  Stable ? ?Complications: None ? ?Disposition: Recovery Room ? ?Procedure in Detail: ?Patient was seen the morning of her surgery and marked out for the procedure. She was then given an IV and IV antibiotics. The patient was taken to the operating room and underwent general anesthesia.  A time out was called and all information was confirmed to be correct. SCD's and a pillow under the knees was in place. The patient was then prepped and draped in the standard sterile fashion. Local was placed into the incision and 2 stab incisions made through which 100 cc of tumescent was placed in each lateral abdominal area. The flanks were liposuctioned and 100 cc removed from the sides. The planned lower incision was then incised and the incision taken down through the Scarpa's fascia to the rectus abdominus fascia. The skin and subcutaneous tissue was then lifted off the fascia up to the level of the former umbilicus.  ? ?The patient was then flexed on the table and the amount that could be excised was confirmed. The pannus was excised and it weighed 4200 grams. The mons was also suspended with 3-0 Monocryl.  The wound was irrigated with normal saline solution. Two #19 blake round drains were placed and secured with 3-0 Silk. Hemoblast and Tissel was applied.  The abdominal wall was closed with buried 3-0 PDS and 3-0 Monocryl and subcuticular 4-0 Monocryl.    The wound was then dressed with dermabond. The prevena was applied.  ABD's and an  abdominal binder were placed. Patient was allowed to wake up, extubated and taken on a stretcher in the flexed position to the recovery room. Family was notified at the end of the case.  ? ?The advanced practice practitioner (APP) assisted throughout the case.  The APP was essential in retraction and counter traction when needed to make the case progress smoothly.  This retraction and assistance made it possible to see the tissue plans for the procedure.  The assistance was needed for blood control, tissue re-approximation and assisted with closure of the incision site. ? ?

## 2022-02-06 ENCOUNTER — Encounter (HOSPITAL_COMMUNITY): Payer: Self-pay | Admitting: Plastic Surgery

## 2022-02-07 ENCOUNTER — Telehealth: Payer: Self-pay

## 2022-02-07 LAB — SURGICAL PATHOLOGY

## 2022-02-07 NOTE — Telephone Encounter (Signed)
Patient called to say she is a patient of Dr. Marla Roe and she had skin removal surgery of the stomach on Monday.  She said one side of the tube where the drainage is, it's leaking where it's inserted.  She said she has been trying to keep the cellophane, whatever it is that we put on it that's sticky, but it continues to leak in that area.  Patient would like to know what she needs to do.  Please call. ?

## 2022-02-07 NOTE — Telephone Encounter (Signed)
[name] [name] called in stating she had surgery on Monday with Dr.D. In the discharge instructions it says it is okay to take the wrap off after 3 days and is wondering if that is okay with Dr.D that she take it off tomorrow. ?

## 2022-02-08 NOTE — Telephone Encounter (Signed)
Called and spoke with the patient on (02/07/22) regarding the message below.  Informed the patient yes she should follow the instruction on the paperwork we gave her.  Patient verbalized understanding and agreed. ? ?Patient stated that the film tape that was placed to hold the Prevena rolled up and she's unable to unroll it.  She stated that she can feel some air coming out.  She said she was given extra film tape.  Informed the patient to just place some of the film tape on the area that she feel the air coming out.  Patient verbalized understanding and agreed.//AB/CMA ?

## 2022-02-12 ENCOUNTER — Ambulatory Visit (INDEPENDENT_AMBULATORY_CARE_PROVIDER_SITE_OTHER): Payer: Medicare Other | Admitting: Surgical

## 2022-02-12 ENCOUNTER — Other Ambulatory Visit: Payer: Self-pay

## 2022-02-12 DIAGNOSIS — M5442 Lumbago with sciatica, left side: Secondary | ICD-10-CM

## 2022-02-12 DIAGNOSIS — M5441 Lumbago with sciatica, right side: Secondary | ICD-10-CM

## 2022-02-12 DIAGNOSIS — G8929 Other chronic pain: Secondary | ICD-10-CM

## 2022-02-12 DIAGNOSIS — M793 Panniculitis, unspecified: Secondary | ICD-10-CM

## 2022-02-12 MED ORDER — CEPHALEXIN 500 MG PO CAPS: 500.0000 mg | ORAL_CAPSULE | Freq: Four times a day (QID) | ORAL | 0 refills | Status: DC

## 2022-02-12 NOTE — Progress Notes (Signed)
71 year old female here for follow-up after panniculectomy with Dr. Marla Roe on 02/05/2022.  She is 1 week postop.  She presents today with her sister.  She reports that she has been staying alone at home, she does not have anyone to help.  She reports she is unable to stay with her family that lives here because they live in units that are upstairs. ? ?She reports that she has been feeling very fatigued, also reports 101 fever over the weekend.  She reports she has been drinking plenty of water, reports she is urinating clear urine.  She reports that she has not been eating much, she reports daily she has been drinking a glass of milk and eating a pack of Ritz crackers.  She reports that she was initially eating very well, however she has been very nauseous lately.  She has not vomited.  She reports that she feels dizzy.  She reports she has not had much output from the JP drains. ? ?She had Prevena incisional wound VAC placed at time of surgery, she reports that after 2 days she removed the wound VAC pump because it was not working well.  She reports that she has not been wearing her abdominal binder. ? ?She has been alone at home, reports family has been unable to provide her with assistance due to family either living out of town or being very busy. ? ?Chaperone present on exam ?Temperature 97.7 ?F, 98% O2, BP 140/80. ?On exam abdominal incision is intact, she has some bruising as expected.  Bilateral JP drains with serosanguineous fluid in bulbs.  Bilateral JP drains are full.  There is no erythema or cellulitic changes. Abdomen is nontender, soft. ? ?Oral mucosa is moist, normal color of oral mucosa.  Breathing is unlabored.   ? ?1400 cc of serosanguineous drainage was drained from the right JP drain, 550 cc of serosanguineous drainage was drained from the left JP drain during today's appointment.  We will leave the drains in place.  Discussed with patient she needs to keep the bulb discharge and empty these  as they get full otherwise they will not provide much relief. ? ?Vaseline was placed over the abdominal incision, covered with 4 x 4 gauze, ABD pad and Medipore tape.  Recommend changing dressing daily.  Recommend continuing to record JP drain output. ? ?We will send prescription for Keflex to patient's pharmacy given her fever.  She did not have a fever today in office.  ? ?Discussed with patient and her sister that patient should be monitored, we recommend patient stay with family or a sitter.  Sister reports she was reaching out to a family friend who may be able to provide home care.  Discussed with patient and her sister if her symptoms do not improve, she notices worsening of her symptoms or has any additional concerning symptoms she should be evaluated in the emergency room.  Given her vital signs are stable, do not feel need for admission to hospital at this time.  However, given her overall health status she is aware that she should be evaluated in the ED if her symptoms worsen or change.  We will schedule patient for a virtual telemetry visit on Wednesday, 2 days from now to check in.  Patient is aware that she can call our office at any time, even after hours if she has concerns or questions.  We will also schedule patient for in person follow-up on Friday, 02/16/2022.  Dr. Marla Roe was also present during  today's evaluation. ? ?I do not see any signs of infection on exam. ? ?

## 2022-02-14 ENCOUNTER — Encounter (HOSPITAL_COMMUNITY): Payer: Self-pay

## 2022-02-14 ENCOUNTER — Other Ambulatory Visit: Payer: Self-pay

## 2022-02-14 ENCOUNTER — Telehealth: Payer: Medicare Other | Admitting: Plastic Surgery

## 2022-02-14 ENCOUNTER — Inpatient Hospital Stay (HOSPITAL_COMMUNITY)
Admission: EM | Admit: 2022-02-14 | Discharge: 2022-02-23 | DRG: 920 | Disposition: A | Discharge status: Home Health | Payer: Medicare Other | Attending: Internal Medicine | Admitting: Internal Medicine

## 2022-02-14 DIAGNOSIS — I110 Hypertensive heart disease with heart failure: Secondary | ICD-10-CM | POA: Diagnosis present

## 2022-02-14 DIAGNOSIS — F32A Depression, unspecified: Secondary | ICD-10-CM | POA: Diagnosis present

## 2022-02-14 DIAGNOSIS — W19XXXA Unspecified fall, initial encounter: Secondary | ICD-10-CM

## 2022-02-14 DIAGNOSIS — Z881 Allergy status to other antibiotic agents status: Secondary | ICD-10-CM

## 2022-02-14 DIAGNOSIS — D62 Acute posthemorrhagic anemia: Secondary | ICD-10-CM | POA: Diagnosis present

## 2022-02-14 DIAGNOSIS — K224 Dyskinesia of esophagus: Secondary | ICD-10-CM | POA: Diagnosis present

## 2022-02-14 DIAGNOSIS — R1310 Dysphagia, unspecified: Secondary | ICD-10-CM | POA: Diagnosis not present

## 2022-02-14 DIAGNOSIS — E119 Type 2 diabetes mellitus without complications: Secondary | ICD-10-CM

## 2022-02-14 DIAGNOSIS — I2510 Atherosclerotic heart disease of native coronary artery without angina pectoris: Secondary | ICD-10-CM | POA: Diagnosis present

## 2022-02-14 DIAGNOSIS — M797 Fibromyalgia: Secondary | ICD-10-CM | POA: Diagnosis present

## 2022-02-14 DIAGNOSIS — Z90710 Acquired absence of both cervix and uterus: Secondary | ICD-10-CM

## 2022-02-14 DIAGNOSIS — T8140XA Infection following a procedure, unspecified, initial encounter: Secondary | ICD-10-CM | POA: Diagnosis present

## 2022-02-14 DIAGNOSIS — Z8349 Family history of other endocrine, nutritional and metabolic diseases: Secondary | ICD-10-CM

## 2022-02-14 DIAGNOSIS — E785 Hyperlipidemia, unspecified: Secondary | ICD-10-CM | POA: Diagnosis not present

## 2022-02-14 DIAGNOSIS — M5442 Lumbago with sciatica, left side: Secondary | ICD-10-CM | POA: Diagnosis present

## 2022-02-14 DIAGNOSIS — K222 Esophageal obstruction: Secondary | ICD-10-CM | POA: Diagnosis not present

## 2022-02-14 DIAGNOSIS — E871 Hypo-osmolality and hyponatremia: Secondary | ICD-10-CM | POA: Diagnosis not present

## 2022-02-14 DIAGNOSIS — E1151 Type 2 diabetes mellitus with diabetic peripheral angiopathy without gangrene: Secondary | ICD-10-CM | POA: Diagnosis not present

## 2022-02-14 DIAGNOSIS — Z825 Family history of asthma and other chronic lower respiratory diseases: Secondary | ICD-10-CM

## 2022-02-14 DIAGNOSIS — I499 Cardiac arrhythmia, unspecified: Secondary | ICD-10-CM | POA: Diagnosis not present

## 2022-02-14 DIAGNOSIS — Z808 Family history of malignant neoplasm of other organs or systems: Secondary | ICD-10-CM

## 2022-02-14 DIAGNOSIS — Z800 Family history of malignant neoplasm of digestive organs: Secondary | ICD-10-CM

## 2022-02-14 DIAGNOSIS — I700 Atherosclerosis of aorta: Secondary | ICD-10-CM | POA: Diagnosis not present

## 2022-02-14 DIAGNOSIS — R633 Feeding difficulties, unspecified: Secondary | ICD-10-CM | POA: Diagnosis present

## 2022-02-14 DIAGNOSIS — K219 Gastro-esophageal reflux disease without esophagitis: Secondary | ICD-10-CM | POA: Diagnosis present

## 2022-02-14 DIAGNOSIS — E8809 Other disorders of plasma-protein metabolism, not elsewhere classified: Secondary | ICD-10-CM | POA: Diagnosis not present

## 2022-02-14 DIAGNOSIS — Z8249 Family history of ischemic heart disease and other diseases of the circulatory system: Secondary | ICD-10-CM

## 2022-02-14 DIAGNOSIS — Z803 Family history of malignant neoplasm of breast: Secondary | ICD-10-CM

## 2022-02-14 DIAGNOSIS — Z96652 Presence of left artificial knee joint: Secondary | ICD-10-CM | POA: Diagnosis present

## 2022-02-14 DIAGNOSIS — R109 Unspecified abdominal pain: Secondary | ICD-10-CM | POA: Diagnosis not present

## 2022-02-14 DIAGNOSIS — Z87891 Personal history of nicotine dependence: Secondary | ICD-10-CM

## 2022-02-14 DIAGNOSIS — E861 Hypovolemia: Secondary | ICD-10-CM | POA: Diagnosis present

## 2022-02-14 DIAGNOSIS — Z8711 Personal history of peptic ulcer disease: Secondary | ICD-10-CM

## 2022-02-14 DIAGNOSIS — Z79899 Other long term (current) drug therapy: Secondary | ICD-10-CM

## 2022-02-14 DIAGNOSIS — L7634 Postprocedural seroma of skin and subcutaneous tissue following other procedure: Secondary | ICD-10-CM | POA: Diagnosis not present

## 2022-02-14 DIAGNOSIS — G8929 Other chronic pain: Secondary | ICD-10-CM | POA: Diagnosis present

## 2022-02-14 DIAGNOSIS — Z8041 Family history of malignant neoplasm of ovary: Secondary | ICD-10-CM

## 2022-02-14 DIAGNOSIS — Z833 Family history of diabetes mellitus: Secondary | ICD-10-CM

## 2022-02-14 DIAGNOSIS — Z8049 Family history of malignant neoplasm of other genital organs: Secondary | ICD-10-CM

## 2022-02-14 DIAGNOSIS — E118 Type 2 diabetes mellitus with unspecified complications: Secondary | ICD-10-CM

## 2022-02-14 DIAGNOSIS — Z9889 Other specified postprocedural states: Secondary | ICD-10-CM

## 2022-02-14 DIAGNOSIS — D500 Iron deficiency anemia secondary to blood loss (chronic): Secondary | ICD-10-CM

## 2022-02-14 DIAGNOSIS — I5022 Chronic systolic (congestive) heart failure: Secondary | ICD-10-CM | POA: Diagnosis present

## 2022-02-14 DIAGNOSIS — Z6838 Body mass index (BMI) 38.0-38.9, adult: Secondary | ICD-10-CM

## 2022-02-14 DIAGNOSIS — Z0389 Encounter for observation for other suspected diseases and conditions ruled out: Secondary | ICD-10-CM | POA: Diagnosis not present

## 2022-02-14 DIAGNOSIS — M5441 Lumbago with sciatica, right side: Secondary | ICD-10-CM | POA: Diagnosis present

## 2022-02-14 DIAGNOSIS — Y92009 Unspecified place in unspecified non-institutional (private) residence as the place of occurrence of the external cause: Secondary | ICD-10-CM

## 2022-02-14 DIAGNOSIS — Z743 Need for continuous supervision: Secondary | ICD-10-CM | POA: Diagnosis not present

## 2022-02-14 DIAGNOSIS — I252 Old myocardial infarction: Secondary | ICD-10-CM

## 2022-02-14 DIAGNOSIS — D509 Iron deficiency anemia, unspecified: Secondary | ICD-10-CM | POA: Diagnosis not present

## 2022-02-14 DIAGNOSIS — E1142 Type 2 diabetes mellitus with diabetic polyneuropathy: Secondary | ICD-10-CM | POA: Diagnosis not present

## 2022-02-14 DIAGNOSIS — M069 Rheumatoid arthritis, unspecified: Secondary | ICD-10-CM | POA: Diagnosis present

## 2022-02-14 DIAGNOSIS — F419 Anxiety disorder, unspecified: Secondary | ICD-10-CM | POA: Diagnosis present

## 2022-02-14 DIAGNOSIS — E876 Hypokalemia: Secondary | ICD-10-CM

## 2022-02-14 DIAGNOSIS — R0902 Hypoxemia: Secondary | ICD-10-CM | POA: Diagnosis not present

## 2022-02-14 DIAGNOSIS — Z888 Allergy status to other drugs, medicaments and biological substances status: Secondary | ICD-10-CM

## 2022-02-14 DIAGNOSIS — E860 Dehydration: Secondary | ICD-10-CM | POA: Diagnosis not present

## 2022-02-14 DIAGNOSIS — Y838 Other surgical procedures as the cause of abnormal reaction of the patient, or of later complication, without mention of misadventure at the time of the procedure: Secondary | ICD-10-CM | POA: Diagnosis present

## 2022-02-14 DIAGNOSIS — R933 Abnormal findings on diagnostic imaging of other parts of digestive tract: Secondary | ICD-10-CM | POA: Diagnosis not present

## 2022-02-14 DIAGNOSIS — I9589 Other hypotension: Secondary | ICD-10-CM | POA: Diagnosis not present

## 2022-02-14 DIAGNOSIS — Z7984 Long term (current) use of oral hypoglycemic drugs: Secondary | ICD-10-CM

## 2022-02-14 DIAGNOSIS — Z955 Presence of coronary angioplasty implant and graft: Secondary | ICD-10-CM

## 2022-02-14 DIAGNOSIS — Z91040 Latex allergy status: Secondary | ICD-10-CM

## 2022-02-14 NOTE — ED Provider Notes (Signed)
?[facility] ?Provider Note ? ?CSN: [id number] ?Arrival date & time: 02/14/22 2222 ? ?Chief Complaint(s) ?Fall ? ?HPI ?[name] [name] is a 71 y.o. female with a past medical history listed below including CHF with a last EF of 50% in August 2022, hypertension, hyperlipidemia, diabetes, and obesity status post liposuction and panniculectomy performed by plastic surgery on March 6.  ? ?She presents today after a fall at home.  Patient reports that she awoke from a nap around 9:30p this evening.  While getting up, she reports losing her balance which caused her to fall onto the ground.  She denies any head trauma or loss of consciousness.  She denies any associated dizziness.  No chest pain or shortness of breath.  No recent nausea or vomiting.  Patient is having abdominal wall discomfort related to the surgery but no overt pain.  She reports that she is hydrating well, and advancing her diet slowly. ? ?During the fall her right JP drain to was pulled out and the bulb from her left JP drain was disconnected.  She reports that this is the reason she came into the emergency department. ? ?Of note patient was seen by plastic surgery in the clinic on March 13.  In their note they note that the patient had been feeling fatigued and was febrile to 101 over the previous weekend.  During the clinic visit they had to drain approximately 2 L from the abdominal wall JP drains.  She was started on Keflex at that time which she reports being compliant with. ? ?The history is provided by the patient.  ? ?Past Medical History ?Past Medical History:  ?Diagnosis Date  ? Anxiety   ? Arthritis   ? Rhumetoid arthritis  ? BRCA negative 07/2017  ? MyRisk neg  ? Cancer [facility])   ? uterine  ? Cellulitis of both lower extremities   ? Chronic  ? CHF (congestive heart failure) ([place])   ? Coronary artery disease   ? Depression   ? Diabetes mellitus without complication ([place])   ? Family history of breast cancer  07/2017  ? MyRisk neg; IBIS=10.5%/riskscore=17%  ? Family history of ovarian cancer   ? Fibromyalgia   ? Fibromyalgia affecting shoulder region   ? Headache   ? Hyperlipidemia   ? Hypertension   ? Spinal stenosis   ? ?Patient Active Problem List  ? Diagnosis Date Noted  ? Right arm pain 10/19/2021  ? Housing instability 03/28/2021  ? Senile purpura ([place]) 03/07/2021  ? Acute anxiety 03/07/2021  ? Panniculitis 02/03/2021  ? Myalgia due to statin 12/26/2020  ? Telogen effluvium 12/26/2020  ? Candidal intertrigo 12/06/2020  ? Microcytic anemia 12/06/2020  ? Polyneuropathy associated with underlying disease ([place]) 02/03/2020  ? Grief 10/20/2019  ? Mixed stress and urge urinary incontinence 08/21/2019  ? Avitaminosis D 07/15/2019  ? Drug-induced constipation 06/12/2019  ? General weakness 07/09/2018  ? Lumbar spondylosis 07/08/2018  ? Chronic bilateral low back pain with bilateral sciatica 07/08/2018  ? Chronic pain syndrome 07/08/2018  ? Urinary incontinence 03/19/2018  ? 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 Atypical endometrial hyperplasia 08/15/2017  ? Lymphedema 01/31/2017  ? Hyperlipidemia associated with type 2 diabetes mellitus ([place]) 12/14/2016  ? Hypertension associated with diabetes ([place]) 12/14/2016  ? Chronic systolic heart failure ([place]) 10/06/2015  ? History of MI (myocardial infarction) 09/20/2015  ? Allergic rhinitis 07/06/2015  ? Chronic venous insufficiency 07/06/2015  ? Atherosclerosis of coronary artery 07/06/2015  ?  MDD (major depressive disorder) 07/06/2015  ? Fibromyalgia 07/06/2015  ? Encounter for long-term (current) use of other medications 07/26/2014  ? Diabetes ([place]) 04/08/2014  ? Migraines 04/08/2014  ? Gastroduodenal ulcer 04/08/2014  ? Venous stasis 04/08/2014  ? Osteoarthritis 04/08/2014  ? Coronary artery disease 04/08/2014  ? Peptic ulcer disease 04/08/2014  ? Obesity 04/08/2014  ? Rheumatoid arthritis ([place]) 04/06/2014  ? ?Home Medication(s) ?Prior to Admission  medications   ?Medication Sig Start Date End Date Taking? Authorizing Provider  ?acetaminophen (TYLENOL) 325 MG tablet Take 650 mg by mouth every 6 (six) hours as needed for moderate pain.   Yes [provider]  ?Biotin w/ Vitamins C & E (HAIR/SKIN/NAILS PO) Take 1 tablet by mouth daily.   Yes [provider]  ?buPROPion (WELLBUTRIN XL) 300 MG 24 hr tablet TAKE 1 TABLET BY MOUTH EVERY DAY ?Patient taking differently: Take 300 mg by mouth daily. 09/11/21  Yes Bacigalupo, Dionne Bucy, MD  ?carvedilol (COREG) 6.25 MG tablet Take 1 tablet (6.25 mg total) by mouth 2 (two) times daily with a meal. 03/07/21  Yes Bacigalupo, Dionne Bucy, MD  ?cephALEXin (KEFLEX) 500 MG capsule Take 1 capsule (500 mg total) by mouth 4 (four) times daily for 5 days. 02/12/22 02/17/22 Yes Scheeler, Carola Rhine, PA-C  ?clonazePAM (KLONOPIN) 0.5 MG tablet Take 0.5-1 tablets (0.25-0.5 mg total) by mouth 2 (two) times daily as needed for anxiety. 03/07/21  Yes Bacigalupo, Dionne Bucy, MD  ?DULoxetine (CYMBALTA) 60 MG capsule TAKE 1 CAPSULE BY MOUTH EVERY DAY ?Patient taking differently: Take 60 mg by mouth daily. 10/20/21  Yes Bacigalupo, Dionne Bucy, MD  ?ezetimibe (ZETIA) 10 MG tablet TAKE 1 TABLET BY MOUTH EVERY DAY ?Patient taking differently: Take 10 mg by mouth daily. 12/16/21  Yes Bacigalupo, Dionne Bucy, MD  ?losartan (COZAAR) 25 MG tablet TAKE 1 TABLET (25 MG TOTAL) BY MOUTH DAILY. 01/15/22  Yes Bacigalupo, Dionne Bucy, MD  ?metFORMIN (GLUCOPHAGE) 500 MG tablet TAKE 1 TABLET BY MOUTH 2 TIMES DAILY WITH A MEAL. ?Patient taking differently: Take 500 mg by mouth 2 (two) times daily with a meal. 10/20/21  Yes Bacigalupo, Dionne Bucy, MD  ?metoprolol succinate (TOPROL-XL) 25 MG 24 hr tablet Take 25 mg by mouth daily. 08/02/21  Yes [provider]  ?ondansetron (ZOFRAN-ODT) 4 MG disintegrating tablet Take 1 tablet (4 mg total) by mouth every 8 (eight) hours as needed for nausea or vomiting. 01/17/22  Yes Corena Herter, PA-C  ?pregabalin (LYRICA)  75 MG capsule Take 1 capsule (75 mg total) by mouth 3 (three) times daily. 07/25/21  Yes Bacigalupo, Dionne Bucy, MD  ?tolterodine (DETROL LA) 4 MG 24 hr capsule Take 1 capsule (4 mg total) by mouth daily. 03/06/21  Yes MacDiarmid, Nicki Reaper, MD  ?Vibegron (GEMTESA) 75 MG TABS Take 75 mg by mouth daily. 06/19/21  Yes MacDiarmid, Nicki Reaper, MD  ?clotrimazole-betamethasone (LOTRISONE) cream APPLY TO AFFECTED AREA TWICE A DAY ?Patient not taking: Reported on 02/15/2022 01/15/22   Virginia Crews, MD  ?naproxen (NAPROSYN) 500 MG tablet TAKE 1 TABLET BY MOUTH 2 TIMES DAILY WITH A MEAL. ?Patient not taking: Reported on 02/15/2022 10/18/21   Virginia Crews, MD  ?predniSONE (DELTASONE) 20 MG tablet Take 1 tablet (20 mg total) by mouth daily with breakfast. ?Patient not taking: Reported on 02/15/2022 01/18/22   Mikey Kirschner, PA-C  ?                                                                                                                                  ?  Allergies ?Sulfa antibiotics, Latex, Doxycycline, and Empagliflozin ? ?Review of Systems ?Review of Systems ?As noted in HPI ? ?Physical Exam ?Vital Signs  ?I have reviewed the triage vital signs ?BP 96/71   Pulse 94   Temp 99.7 ?F (37.6 ?C) (Oral)   Resp (!) 23   Ht _0  (1.727 m)   Wt 99.8 kg   LMP  (LMP Unknown) Comment: age 37  SpO2 99%   BMI 33.45 kg/m?  ? ?Physical Exam ?Vitals reviewed.  ?Constitutional:   ?   General: She is not in acute distress. ?   Appearance: She is well-developed. She is not diaphoretic.  ?HENT:  ?   Head: Normocephalic and atraumatic.  ?   Comments: dentures ?   Nose: Nose normal.  ?Eyes:  ?   General: No scleral icterus.    ?   Right eye: No discharge.     ?   Left eye: No discharge.  ?   Conjunctiva/sclera: Conjunctivae normal.  ?   Pupils: Pupils are equal, round, and reactive to light.  ?Cardiovascular:  ?   Rate and Rhythm: Normal rate and regular rhythm.  ?   Heart sounds: No murmur heard. ?  No friction rub. No gallop.   ?Pulmonary:  ?   Effort: Pulmonary effort is normal. No respiratory distress.  ?   Breath sounds: Normal breath sounds. No stridor. No rales.  ?Abdominal:  ?   General: There is no distension.  ?   Palpations: Abdomen is soft.  ?

## 2022-02-14 NOTE — ED Triage Notes (Signed)
Pt with copious amounts of serosanguinous drainage coming from R hip. Moderate amount of drainage coming from L hip.  ?

## 2022-02-14 NOTE — ED Triage Notes (Signed)
Pt BIB [facility] coming from home. Pt fell at home after standing up pt became lightheaded and lost her balance. Pt did hit her head, but denies LOC. Pt has recent sx to abd 1 week prior and 2 drains were pulled off.  ?

## 2022-02-15 ENCOUNTER — Emergency Department (HOSPITAL_COMMUNITY): Payer: Medicare Other

## 2022-02-15 DIAGNOSIS — Z0389 Encounter for observation for other suspected diseases and conditions ruled out: Secondary | ICD-10-CM | POA: Diagnosis not present

## 2022-02-15 DIAGNOSIS — Z87891 Personal history of nicotine dependence: Secondary | ICD-10-CM | POA: Diagnosis not present

## 2022-02-15 DIAGNOSIS — E1142 Type 2 diabetes mellitus with diabetic polyneuropathy: Secondary | ICD-10-CM | POA: Diagnosis not present

## 2022-02-15 DIAGNOSIS — M069 Rheumatoid arthritis, unspecified: Secondary | ICD-10-CM | POA: Diagnosis not present

## 2022-02-15 DIAGNOSIS — Y838 Other surgical procedures as the cause of abnormal reaction of the patient, or of later complication, without mention of misadventure at the time of the procedure: Secondary | ICD-10-CM | POA: Diagnosis present

## 2022-02-15 DIAGNOSIS — I110 Hypertensive heart disease with heart failure: Secondary | ICD-10-CM | POA: Diagnosis not present

## 2022-02-15 DIAGNOSIS — R109 Unspecified abdominal pain: Secondary | ICD-10-CM | POA: Diagnosis not present

## 2022-02-15 DIAGNOSIS — E876 Hypokalemia: Secondary | ICD-10-CM

## 2022-02-15 DIAGNOSIS — I5022 Chronic systolic (congestive) heart failure: Secondary | ICD-10-CM | POA: Diagnosis not present

## 2022-02-15 DIAGNOSIS — D509 Iron deficiency anemia, unspecified: Secondary | ICD-10-CM | POA: Diagnosis not present

## 2022-02-15 DIAGNOSIS — T8140XA Infection following a procedure, unspecified, initial encounter: Secondary | ICD-10-CM | POA: Diagnosis present

## 2022-02-15 DIAGNOSIS — I509 Heart failure, unspecified: Secondary | ICD-10-CM | POA: Diagnosis not present

## 2022-02-15 DIAGNOSIS — Z6838 Body mass index (BMI) 38.0-38.9, adult: Secondary | ICD-10-CM | POA: Diagnosis not present

## 2022-02-15 DIAGNOSIS — E785 Hyperlipidemia, unspecified: Secondary | ICD-10-CM | POA: Diagnosis present

## 2022-02-15 DIAGNOSIS — R933 Abnormal findings on diagnostic imaging of other parts of digestive tract: Secondary | ICD-10-CM | POA: Diagnosis not present

## 2022-02-15 DIAGNOSIS — W19XXXA Unspecified fall, initial encounter: Secondary | ICD-10-CM | POA: Diagnosis not present

## 2022-02-15 DIAGNOSIS — M5442 Lumbago with sciatica, left side: Secondary | ICD-10-CM | POA: Diagnosis not present

## 2022-02-15 DIAGNOSIS — I2510 Atherosclerotic heart disease of native coronary artery without angina pectoris: Secondary | ICD-10-CM | POA: Diagnosis not present

## 2022-02-15 DIAGNOSIS — R531 Weakness: Secondary | ICD-10-CM | POA: Diagnosis not present

## 2022-02-15 DIAGNOSIS — R1310 Dysphagia, unspecified: Secondary | ICD-10-CM | POA: Diagnosis not present

## 2022-02-15 DIAGNOSIS — L7634 Postprocedural seroma of skin and subcutaneous tissue following other procedure: Secondary | ICD-10-CM | POA: Diagnosis not present

## 2022-02-15 DIAGNOSIS — I959 Hypotension, unspecified: Secondary | ICD-10-CM | POA: Insufficient documentation

## 2022-02-15 DIAGNOSIS — D62 Acute posthemorrhagic anemia: Secondary | ICD-10-CM | POA: Diagnosis present

## 2022-02-15 DIAGNOSIS — E861 Hypovolemia: Secondary | ICD-10-CM | POA: Diagnosis not present

## 2022-02-15 DIAGNOSIS — F32A Depression, unspecified: Secondary | ICD-10-CM | POA: Diagnosis not present

## 2022-02-15 DIAGNOSIS — Y92009 Unspecified place in unspecified non-institutional (private) residence as the place of occurrence of the external cause: Secondary | ICD-10-CM | POA: Diagnosis not present

## 2022-02-15 DIAGNOSIS — E860 Dehydration: Secondary | ICD-10-CM | POA: Diagnosis present

## 2022-02-15 DIAGNOSIS — I9589 Other hypotension: Secondary | ICD-10-CM | POA: Diagnosis not present

## 2022-02-15 DIAGNOSIS — Z9049 Acquired absence of other specified parts of digestive tract: Secondary | ICD-10-CM | POA: Diagnosis not present

## 2022-02-15 DIAGNOSIS — M5441 Lumbago with sciatica, right side: Secondary | ICD-10-CM | POA: Diagnosis not present

## 2022-02-15 DIAGNOSIS — K222 Esophageal obstruction: Secondary | ICD-10-CM | POA: Diagnosis not present

## 2022-02-15 DIAGNOSIS — I700 Atherosclerosis of aorta: Secondary | ICD-10-CM | POA: Diagnosis not present

## 2022-02-15 DIAGNOSIS — K224 Dyskinesia of esophagus: Secondary | ICD-10-CM | POA: Diagnosis not present

## 2022-02-15 DIAGNOSIS — E871 Hypo-osmolality and hyponatremia: Secondary | ICD-10-CM | POA: Diagnosis not present

## 2022-02-15 DIAGNOSIS — E1151 Type 2 diabetes mellitus with diabetic peripheral angiopathy without gangrene: Secondary | ICD-10-CM | POA: Diagnosis not present

## 2022-02-15 DIAGNOSIS — G8929 Other chronic pain: Secondary | ICD-10-CM | POA: Diagnosis not present

## 2022-02-15 DIAGNOSIS — E8809 Other disorders of plasma-protein metabolism, not elsewhere classified: Secondary | ICD-10-CM | POA: Diagnosis not present

## 2022-02-15 DIAGNOSIS — K219 Gastro-esophageal reflux disease without esophagitis: Secondary | ICD-10-CM | POA: Diagnosis not present

## 2022-02-15 LAB — MRSA NEXT GEN BY PCR, NASAL: MRSA by PCR Next Gen: NOT DETECTED

## 2022-02-15 LAB — GLUCOSE, CAPILLARY
Glucose-Capillary: 166 mg/dL — ABNORMAL HIGH (ref 70–99)
Glucose-Capillary: 190 mg/dL — ABNORMAL HIGH (ref 70–99)
Glucose-Capillary: 152 mg/dL — ABNORMAL HIGH (ref 70–99)

## 2022-02-15 LAB — CBC WITH DIFFERENTIAL/PLATELET
Abs Immature Granulocytes: 0.21 10*3/uL — ABNORMAL HIGH (ref 0.00–0.07)
Abs Immature Granulocytes: 0.14 10*3/uL — ABNORMAL HIGH (ref 0.00–0.07)
Abs Immature Granulocytes: 0 10*3/uL (ref 0.00–0.07)
Basophils Absolute: 0 10*3/uL (ref 0.0–0.1)
Basophils Absolute: 0 10*3/uL (ref 0.0–0.1)
Basophils Absolute: 0 10*3/uL (ref 0.0–0.1)
Basophils Relative: 1 %
Basophils Relative: 1 %
Basophils Relative: 0 %
Eosinophils Absolute: 0.2 10*3/uL (ref 0.0–0.5)
Eosinophils Absolute: 0.1 10*3/uL (ref 0.0–0.5)
Eosinophils Absolute: 0.2 10*3/uL (ref 0.0–0.5)
Eosinophils Relative: 4 %
Eosinophils Relative: 3 %
Eosinophils Relative: 4 %
HCT: 28.1 % — ABNORMAL LOW (ref 36.0–46.0)
HCT: 24.9 % — ABNORMAL LOW (ref 36.0–46.0)
HCT: 26.3 % — ABNORMAL LOW (ref 36.0–46.0)
Hemoglobin: 9 g/dL — ABNORMAL LOW (ref 12.0–15.0)
Hemoglobin: 7.8 g/dL — ABNORMAL LOW (ref 12.0–15.0)
Hemoglobin: 8.6 g/dL — ABNORMAL LOW (ref 12.0–15.0)
Immature Granulocytes: 4 %
Immature Granulocytes: 3 %
Lymphocytes Relative: 17 %
Lymphocytes Relative: 19 %
Lymphocytes Relative: 25 %
Lymphs Abs: 0.9 10*3/uL (ref 0.7–4.0)
Lymphs Abs: 1 10*3/uL (ref 0.7–4.0)
Lymphs Abs: 1.4 10*3/uL (ref 0.7–4.0)
MCH: 26.3 pg (ref 26.0–34.0)
MCH: 25.9 pg — ABNORMAL LOW (ref 26.0–34.0)
MCH: 26.7 pg (ref 26.0–34.0)
MCHC: 32 g/dL (ref 30.0–36.0)
MCHC: 31.3 g/dL (ref 30.0–36.0)
MCHC: 32.7 g/dL (ref 30.0–36.0)
MCV: 82.2 fL (ref 80.0–100.0)
MCV: 82.7 fL (ref 80.0–100.0)
MCV: 81.7 fL (ref 80.0–100.0)
Monocytes Absolute: 0.8 10*3/uL (ref 0.1–1.0)
Monocytes Absolute: 0.7 10*3/uL (ref 0.1–1.0)
Monocytes Absolute: 0.7 10*3/uL (ref 0.1–1.0)
Monocytes Relative: 15 %
Monocytes Relative: 14 %
Monocytes Relative: 12 %
Neutro Abs: 3.2 10*3/uL (ref 1.7–7.7)
Neutro Abs: 3.1 10*3/uL (ref 1.7–7.7)
Neutro Abs: 3.4 10*3/uL (ref 1.7–7.7)
Neutrophils Relative %: 59 %
Neutrophils Relative %: 60 %
Neutrophils Relative %: 59 %
Platelets: 411 10*3/uL — ABNORMAL HIGH (ref 150–400)
Platelets: 347 10*3/uL (ref 150–400)
Platelets: 371 10*3/uL (ref 150–400)
RBC: 3.42 MIL/uL — ABNORMAL LOW (ref 3.87–5.11)
RBC: 3.01 MIL/uL — ABNORMAL LOW (ref 3.87–5.11)
RBC: 3.22 MIL/uL — ABNORMAL LOW (ref 3.87–5.11)
RDW: 14.6 % (ref 11.5–15.5)
RDW: 14.7 % (ref 11.5–15.5)
RDW: 14.8 % (ref 11.5–15.5)
Smear Review: NORMAL
WBC: 5.3 10*3/uL (ref 4.0–10.5)
WBC: 5.2 10*3/uL (ref 4.0–10.5)
WBC: 5.7 10*3/uL (ref 4.0–10.5)
nRBC: 0.4 % — ABNORMAL HIGH (ref 0.0–0.2)
nRBC: 0 % (ref 0.0–0.2)
nRBC: 0.5 % — ABNORMAL HIGH (ref 0.0–0.2)
nRBC: 2 /100 WBC — ABNORMAL HIGH

## 2022-02-15 LAB — COMPREHENSIVE METABOLIC PANEL
ALT: 14 U/L (ref 0–44)
AST: 11 U/L — ABNORMAL LOW (ref 15–41)
Albumin: 1.6 g/dL — ABNORMAL LOW (ref 3.5–5.0)
Alkaline Phosphatase: 94 U/L (ref 38–126)
Anion gap: 9 (ref 5–15)
BUN: 6 mg/dL — ABNORMAL LOW (ref 8–23)
CO2: 27 mmol/L (ref 22–32)
Calcium: 8.3 mg/dL — ABNORMAL LOW (ref 8.9–10.3)
Chloride: 89 mmol/L — ABNORMAL LOW (ref 98–111)
Creatinine, Ser: 0.68 mg/dL (ref 0.44–1.00)
GFR, Estimated: >60 mL/min (ref >60)
Glucose, Bld: 227 mg/dL — ABNORMAL HIGH (ref 70–99)
Potassium: 3 mmol/L — ABNORMAL LOW (ref 3.5–5.1)
Sodium: 125 mmol/L — ABNORMAL LOW (ref 135–145)
Total Bilirubin: 0.3 mg/dL (ref 0.3–1.2)
Total Protein: 6.1 g/dL — ABNORMAL LOW (ref 6.5–8.1)

## 2022-02-15 LAB — HIV ANTIBODY (ROUTINE TESTING W REFLEX): HIV Screen 4th Generation wRfx: NONREACTIVE

## 2022-02-15 LAB — BASIC METABOLIC PANEL
Anion gap: 12 (ref 5–15)
Anion gap: 10 (ref 5–15)
BUN: <5 mg/dL — ABNORMAL LOW (ref 8–23)
BUN: <5 mg/dL — ABNORMAL LOW (ref 8–23)
CO2: 24 mmol/L (ref 22–32)
CO2: 25 mmol/L (ref 22–32)
Calcium: 7.7 mg/dL — ABNORMAL LOW (ref 8.9–10.3)
Calcium: 7.7 mg/dL — ABNORMAL LOW (ref 8.9–10.3)
Chloride: 92 mmol/L — ABNORMAL LOW (ref 98–111)
Chloride: 97 mmol/L — ABNORMAL LOW (ref 98–111)
Creatinine, Ser: 0.58 mg/dL (ref 0.44–1.00)
Creatinine, Ser: 0.68 mg/dL (ref 0.44–1.00)
GFR, Estimated: >60 mL/min (ref >60)
GFR, Estimated: >60 mL/min (ref >60)
Glucose, Bld: 251 mg/dL — ABNORMAL HIGH (ref 70–99)
Glucose, Bld: 189 mg/dL — ABNORMAL HIGH (ref 70–99)
Potassium: 3 mmol/L — ABNORMAL LOW (ref 3.5–5.1)
Potassium: 3.1 mmol/L — ABNORMAL LOW (ref 3.5–5.1)
Sodium: 128 mmol/L — ABNORMAL LOW (ref 135–145)
Sodium: 132 mmol/L — ABNORMAL LOW (ref 135–145)

## 2022-02-15 LAB — URINALYSIS, ROUTINE W REFLEX MICROSCOPIC
Bacteria, UA: NONE SEEN
Bilirubin Urine: NEGATIVE
Glucose, UA: 50 mg/dL — AB
Ketones, ur: NEGATIVE mg/dL
Leukocytes,Ua: NEGATIVE
Nitrite: NEGATIVE
Protein, ur: 30 mg/dL — AB
Specific Gravity, Urine: 1.021 (ref 1.005–1.030)
pH: 7 (ref 5.0–8.0)

## 2022-02-15 LAB — LACTIC ACID, PLASMA
Lactic Acid, Venous: 1.4 mmol/L (ref 0.5–1.9)
Lactic Acid, Venous: 1.4 mmol/L (ref 0.5–1.9)

## 2022-02-15 LAB — PHOSPHORUS: Phosphorus: 1.5 mg/dL — ABNORMAL LOW (ref 2.5–4.6)

## 2022-02-15 LAB — PROTIME-INR
INR: 1.2 (ref 0.8–1.2)
Prothrombin Time: 14.9 seconds (ref 11.4–15.2)

## 2022-02-15 LAB — PREPARE RBC (CROSSMATCH)

## 2022-02-15 LAB — CBG MONITORING, ED: Glucose-Capillary: 190 mg/dL — ABNORMAL HIGH (ref 70–99)

## 2022-02-15 LAB — MAGNESIUM: Magnesium: 1.7 mg/dL (ref 1.7–2.4)

## 2022-02-15 MED ORDER — ONDANSETRON HCL 4 MG PO TABS: 4.0000 mg | ORAL_TABLET | Freq: Four times a day (QID) | ORAL | Status: DC | PRN

## 2022-02-15 MED ORDER — SODIUM CHLORIDE 0.9 % IV SOLN: 1.0000 g | INTRAVENOUS | Status: DC

## 2022-02-15 MED ORDER — POTASSIUM CHLORIDE CRYS ER 20 MEQ PO TBCR
40.0000 meq | EXTENDED_RELEASE_TABLET | Freq: Once | ORAL | Status: AC
  Administered 2022-02-15: 40 meq via ORAL
  Filled 2022-02-15: qty 2

## 2022-02-15 MED ORDER — MAGNESIUM SULFATE 2 GM/50ML IV SOLN
2.0000 g | Freq: Once | INTRAVENOUS | Status: AC
  Administered 2022-02-15: 2 g via INTRAVENOUS
  Filled 2022-02-15: qty 50

## 2022-02-15 MED ORDER — BUPROPION HCL ER (XL) 300 MG PO TB24
300.0000 mg | ORAL_TABLET | Freq: Every day | ORAL | Status: DC
  Administered 2022-02-15 – 2022-02-23 (×9): 300 mg via ORAL
  Filled 2022-02-15 (×9): qty 1

## 2022-02-15 MED ORDER — IOHEXOL 300 MG/ML  SOLN
100.0000 mL | Freq: Once | INTRAMUSCULAR | Status: AC | PRN
  Administered 2022-02-15: 100 mL via INTRAVENOUS

## 2022-02-15 MED ORDER — VANCOMYCIN HCL 1500 MG/300ML IV SOLN
1500.0000 mg | INTRAVENOUS | Status: DC
  Administered 2022-02-16: 1500 mg via INTRAVENOUS
  Filled 2022-02-15 (×2): qty 300

## 2022-02-15 MED ORDER — PREGABALIN 75 MG PO CAPS
75.0000 mg | ORAL_CAPSULE | Freq: Three times a day (TID) | ORAL | Status: DC
  Administered 2022-02-15 – 2022-02-23 (×24): 75 mg via ORAL
  Filled 2022-02-15 (×23): qty 1
  Filled 2022-02-15: qty 3
  Filled 2022-02-15: qty 1

## 2022-02-15 MED ORDER — DULOXETINE HCL 60 MG PO CPEP
60.0000 mg | ORAL_CAPSULE | Freq: Every day | ORAL | Status: DC
  Administered 2022-02-15 – 2022-02-23 (×9): 60 mg via ORAL
  Filled 2022-02-15 (×9): qty 1

## 2022-02-15 MED ORDER — SODIUM CHLORIDE 0.9 % IV SOLN
2.0000 g | Freq: Three times a day (TID) | INTRAVENOUS | Status: DC
  Administered 2022-02-15 – 2022-02-19 (×12): 2 g via INTRAVENOUS
  Filled 2022-02-15 (×13): qty 2

## 2022-02-15 MED ORDER — VANCOMYCIN HCL 10 G IV SOLR
2250.0000 mg | Freq: Once | INTRAVENOUS | Status: AC
  Administered 2022-02-15: 2250 mg via INTRAVENOUS
  Filled 2022-02-15: qty 20

## 2022-02-15 MED ORDER — FESOTERODINE FUMARATE ER 8 MG PO TB24
8.0000 mg | ORAL_TABLET | Freq: Every day | ORAL | Status: DC
Start: 2022-02-15 — End: 2022-02-23
  Administered 2022-02-15 – 2022-02-23 (×9): 8 mg via ORAL
  Filled 2022-02-15 (×10): qty 1

## 2022-02-15 MED ORDER — SODIUM CHLORIDE 0.9 % IV BOLUS (SEPSIS)
1000.0000 mL | Freq: Once | INTRAVENOUS | Status: AC
  Administered 2022-02-15: 1000 mL via INTRAVENOUS

## 2022-02-15 MED ORDER — SODIUM CHLORIDE 0.9 % IV SOLN
1000.0000 mL | INTRAVENOUS | Status: DC
  Administered 2022-02-15: 1000 mL via INTRAVENOUS

## 2022-02-15 MED ORDER — SODIUM CHLORIDE 0.9 % IV SOLN: 10.0000 mL/h | Freq: Once | INTRAVENOUS | Status: DC

## 2022-02-15 MED ORDER — INSULIN ASPART 100 UNIT/ML IJ SOLN
0.0000 [IU] | Freq: Three times a day (TID) | INTRAMUSCULAR | Status: DC
  Administered 2022-02-15 – 2022-02-16 (×4): 3 [IU] via SUBCUTANEOUS
  Administered 2022-02-16: 8 [IU] via SUBCUTANEOUS
  Administered 2022-02-17: 5 [IU] via SUBCUTANEOUS
  Administered 2022-02-17: 2 [IU] via SUBCUTANEOUS
  Administered 2022-02-18 (×2): 3 [IU] via SUBCUTANEOUS
  Administered 2022-02-19: 2 [IU] via SUBCUTANEOUS
  Administered 2022-02-19 (×2): 3 [IU] via SUBCUTANEOUS
  Administered 2022-02-20: 2 [IU] via SUBCUTANEOUS
  Administered 2022-02-20 – 2022-02-21 (×3): 3 [IU] via SUBCUTANEOUS
  Administered 2022-02-21: 2 [IU] via SUBCUTANEOUS
  Administered 2022-02-22: 3 [IU] via SUBCUTANEOUS
  Administered 2022-02-22 (×2): 2 [IU] via SUBCUTANEOUS
  Administered 2022-02-23 (×2): 3 [IU] via SUBCUTANEOUS

## 2022-02-15 MED ORDER — SODIUM CHLORIDE 0.9 % IV BOLUS
1000.0000 mL | Freq: Once | INTRAVENOUS | Status: AC
  Administered 2022-02-15: 1000 mL via INTRAVENOUS

## 2022-02-15 MED ORDER — SCOPOLAMINE 1 MG/3DAYS TD PT72
1.0000 | MEDICATED_PATCH | TRANSDERMAL | Status: DC
Start: 2022-02-15 — End: 2022-02-15

## 2022-02-15 MED ORDER — EZETIMIBE 10 MG PO TABS
10.0000 mg | ORAL_TABLET | Freq: Every day | ORAL | Status: DC
  Administered 2022-02-15: 10 mg via ORAL
  Filled 2022-02-15: qty 1

## 2022-02-15 MED ORDER — ENOXAPARIN SODIUM 40 MG/0.4ML IJ SOSY
40.0000 mg | PREFILLED_SYRINGE | INTRAMUSCULAR | Status: DC
  Filled 2022-02-15: qty 0.4

## 2022-02-15 MED ORDER — SODIUM CHLORIDE 0.9 % IV SOLN
1.0000 g | Freq: Once | INTRAVENOUS | Status: AC
  Administered 2022-02-15: 1 g via INTRAVENOUS
  Filled 2022-02-15: qty 10

## 2022-02-15 MED ORDER — SCOPOLAMINE 1 MG/3DAYS TD PT72
1.0000 | MEDICATED_PATCH | TRANSDERMAL | Status: AC
  Administered 2022-02-15: 1.5 mg via TRANSDERMAL
  Filled 2022-02-15: qty 1

## 2022-02-15 MED ORDER — ONDANSETRON HCL 4 MG/2ML IJ SOLN: 4.0000 mg | Freq: Four times a day (QID) | INTRAMUSCULAR | Status: DC | PRN

## 2022-02-15 MED ORDER — VIBEGRON 75 MG PO TABS: 75.0000 mg | ORAL_TABLET | Freq: Every day | ORAL | Status: DC

## 2022-02-15 MED ORDER — POTASSIUM PHOSPHATES 15 MMOLE/5ML IV SOLN
30.0000 mmol | Freq: Once | INTRAVENOUS | Status: AC
  Administered 2022-02-15: 30 mmol via INTRAVENOUS
  Filled 2022-02-15: qty 10

## 2022-02-15 MED ORDER — LIDOCAINE VISCOUS HCL 2 % MT SOLN
15.0000 mL | Freq: Once | OROMUCOSAL | Status: AC
  Administered 2022-02-15: 15 mL via ORAL
  Filled 2022-02-15: qty 15

## 2022-02-15 MED ORDER — ALUM & MAG HYDROXIDE-SIMETH 200-200-20 MG/5ML PO SUSP
30.0000 mL | Freq: Once | ORAL | Status: AC
  Administered 2022-02-15: 30 mL via ORAL
  Filled 2022-02-15: qty 30

## 2022-02-15 MED ORDER — CLONAZEPAM 0.25 MG PO TBDP
0.2500 mg | ORAL_TABLET | Freq: Two times a day (BID) | ORAL | Status: DC | PRN
  Administered 2022-02-17 – 2022-02-21 (×3): 0.25 mg via ORAL
  Administered 2022-02-22: 0.5 mg via ORAL
  Filled 2022-02-15: qty 2
  Filled 2022-02-15 (×3): qty 1

## 2022-02-15 MED ORDER — ALBUMIN HUMAN 25 % IV SOLN
25.0000 g | Freq: Once | INTRAVENOUS | Status: AC
  Administered 2022-02-15: 25 g via INTRAVENOUS
  Filled 2022-02-15: qty 100

## 2022-02-15 NOTE — Assessment & Plan Note (Addendum)
Likely due to fluid losses from surgery. ?-resolved on labs ?

## 2022-02-15 NOTE — Assessment & Plan Note (Signed)
Continue lyrica 

## 2022-02-15 NOTE — Plan of Care (Signed)
  Problem: Education: Goal: Knowledge of General Education information will improve Description Including pain rating scale, medication(s)/side effects and non-pharmacologic comfort measures Outcome: Progressing   Problem: Activity: Goal: Risk for activity intolerance will decrease Outcome: Progressing   Problem: Safety: Goal: Ability to remain free from injury will improve Outcome: Progressing   

## 2022-02-15 NOTE — Assessment & Plan Note (Addendum)
Hypokalemia/hypomagnesemia/hypophosphatemia: Replaced.   ?

## 2022-02-15 NOTE — Progress Notes (Addendum)
NEW ADMISSION NOTE ?New Admission Note:  ? ?Arrival Method: Patient arrived from ED ?Mental Orientation: Alert and oriented x 4. ?Telemetry: 37M#01, NSR ?Assessment: Completed ?Skin: dry, bruising noted on bilateral arms on left temporal due to fall.  Incision noted  horizontally on the lower abdomen from the liposuction with some black areas (Eshar) noted along the incision site.  Right side JP drain came out with the fall and it is oozing serous fluid which is covered with guaze and abdominal pad.  Discoloration on bilateral lower extremities noted.   ?IV:  Left posterior FA and left distal FA. ?Pain: denies any pain currently. ?Tubes: JP drain on the left lower abdomen. ?Safety Measures: Safety Fall Prevention Plan has been given, discussed and signed ?Admission: in progress ?[facility] Orientation: Patient has been orientated to the room, unit and staff.  ? ?Orders have been reviewed and implemented. Will continue to monitor the patient. Call light has been placed within reach and bed alarm has been activated.  ? ?Amaryllis Dyke, RN   ?

## 2022-02-15 NOTE — Progress Notes (Signed)
Pharmacy Antibiotic Note ? ?[name] [name] is a 71 y.o. female admitted on 02/14/2022 with  wound infection .  Pharmacy has been consulted for cefepime and vancomycin dosing. ? ?Plan: ?Start Cefepime 2g IV q8h ?Load vancomycin 2250 mg IV x 1, followed by  ?Vancomycin 1500 mg IV q24h for estimated AUC ~495 (goal AUC 400-550) ?Monitor CBC daily and check vancomycin peak/trough levels once at steady state ? ?Height: '5\' 8"'$  (172.7 cm) ?Weight: 99.8 kg (220 lb) ?IBW/kg (Calculated) : 63.9 ? ?Temp (24hrs), Avg:99.3 ?F (37.4 ?C), Min:98.8 ?F (37.1 ?C), Max:99.7 ?F (37.6 ?C) ? ?Recent Labs  ?Lab 02/14/22 ?2334 02/15/22 ?0615 02/15/22 ?3500  ?WBC 5.3  --  5.2  ?CREATININE 0.68  --  0.58  ?LATICACIDVEN 1.4 1.4  --   ?  ?Estimated Creatinine Clearance: 83.2 mL/min (by C-G formula based on SCr of 0.58 mg/dL).   ? ?Allergies  ?Allergen Reactions  ? Sulfa Antibiotics Itching, Rash and Swelling  ? Latex Itching  ? Doxycycline Rash  ? Empagliflozin Itching and Other (See Comments)  ?  Itching and frequent yeast infections  ? ? ?Antimicrobials this admission: ?Ceftriaxone 3/16 x1  ?Cefepime 3/16 >>  ?Vancomycin 3/16 >>  ? ?Dose adjustments this admission: ?None ? ?Microbiology results: ?3/16 BCx: no growth <12 hrs ?3/16 Wound (from panniculus) Cx: to be collected ?3/16 MRSA PCR: not detected ? ?Thank you for allowing pharmacy to be a part of this patient?s care. ? ?[name] [name] ?02/15/2022 11:50 AM ? ?

## 2022-02-15 NOTE — Progress Notes (Signed)
?PROGRESS NOTE ? ? ? ?[name] [name]  AGT:[id number] DOB: January 21, 1951 DOA: 02/14/2022 ?PCP: Virginia Crews, MD  ? ? ?Brief Narrative:  ?70 year old female with history of type 2 diabetes, hypertension, obesity status post liposuction and panniculectomy on 3/6 presented from home with fall, dizziness and abdominal wall discomfort. ?Liposuction and panniculectomy 3/6, seen and followed by plastic surgery, recently seen in the office and started on Keflex.  She has 2 JP drains, apparently disconnected while falling.  In the emergency room, presentation blood pressure 89/52, sodium 125, potassium 3.  Started on IV fluid resuscitation, started on antibiotics and admitted to the hospital. ? ? ?Assessment & Plan: ?  ?Hypovolemic hyponatremia. ?Moderate dehydration with ongoing fluid loss in the context of ongoing use of blood pressure medications. ?Received 1 L isotonic fluid and currently on 125 mL/h maintenance fluid.  Renal functions are adequate.  Also received 25 g of albumin.  Blood pressures appropriately stabilizing. ?Patient is still clinically dehydrated, will give additional 1000 mL bolus. ?Sodium appropriately responding 128.  Continue isotonic fluid.  Recheck in the evening. ? ?Postoperative seroma/postop infection: ?CT scan consistent with postop findings, subcutaneous air but no drainable fluid collection.  WBC count is normal.  She is on Keflex as outpatient.  Cultures were drawn.  Started on Rocephin.  ?Discussed with surgery, recommended to treat as postoperative infection.  ?Broaden antibiotic coverage to cover for MRSA and Pseudomonas.  Started on vancomycin and cefepime.  ?Surgery discussing with IR for possible placement of drain.  Postop management as per surgery. ? ?Essential hypertension: Now with dehydration.  Discontinue all antihypertensives. ? ?Hypokalemia: Replaced.  Still low.  Replace with IV potassium. ? ?Hypomagnesemia/hypophosphatemia: Replace aggressively.  Recheck level tomorrow  morning. ? 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  Continue. ? ?Type 2 diabetes: On metformin.  Well-controlled at home.  Holding metformin.  Started on sliding scale insulin. ? ?Anemia associated with acute blood loss:  ?Preop hemoglobin 11, presentation hemoglobin 9-7.8. ?Currently no indication for transfusion as her blood pressures have been stabilized.  Continue to monitor every 12 hours and transfuse if hemoglobin less than 7.  Patient has consented for transfusion if needed. ? ?Discussed with Dr. Marla Roe at bedside. ? ? ?DVT prophylaxis: enoxaparin (LOVENOX) injection 40 mg Start: 02/15/22 1000 ?SCDs Start: 02/15/22 0338 ? ? ?Code Status: Full code ?Family Communication: None ?Disposition Plan: Status is: Observation ?The patient will require care spanning > 2 midnights and should be moved to inpatient because: Significant postoperative infection.  Hyponatremia and dehydration. ?  ? ? ?Consultants:  ?Plastic. ? ?Procedures:  ?None ? ?Antimicrobials:  ?Rocephin 3/15-3/16 ?Vancomycin and cefepime 3/16--- ? ? ?Subjective: ?Patient seen and examined.  A lot of abdominal discomfort.  Feels very dehydrated and dry.  Not ambulated yet.  We will consult PT OT to ambulate.  Blood pressures adequately responding. ?Patient is still clinically dehydrated.  Will give bolus fluid. ? ?Objective: ?Vitals:  ? 02/15/22 0815 02/15/22 0900 02/15/22 0915 02/15/22 0919  ?BP: 99/73 (!) 126/54 (!) 128/57   ?Pulse: 94 94 93   ?Resp: (!) 21 (!) 23 (!) 21   ?Temp:    98.8 ?F (37.1 ?C)  ?TempSrc:    Oral  ?SpO2: 96% 98% 96%   ?Weight:      ?Height:      ? ? ?Intake/Output Summary (Last 24 hours) at 02/15/2022 1116 ?Last data filed at 02/15/2022 0432 ?Gross per 24 hour  ?Intake 1100 ml  ?Output --  ?Net  1100 ml  ? ?Filed Weights  ? 02/14/22 2226  ?Weight: 99.8 kg  ? ? ?Examination: ? ?General exam: Appears in moderate distress on mobility and manipulation of the wounds.  Appropriately anxious. ?Mucous membranes are  dry. ?Respiratory system: Clear to auscultation. Respiratory effort normal.  No added sounds. ?Cardiovascular system: S1 & S2 heard, RRR. No JVD, murmurs, rubs, gallops or clicks. No pedal edema. ?Gastrointestinal system:  ?Obese.  Pendulous.  Large suprapubic incision clean and dry, tender all over without rigidity or guarding. ?Left flank JP drain draining on the bed.  Serosanguineous fluid. ?Central nervous system: Alert and oriented. No focal neurological deficits. ?Extremities: Symmetric 5 x 5 power. ? ? ? ?Data Reviewed: I have personally reviewed following labs and imaging studies ? ?CBC: ?Recent Labs  ?Lab 02/14/22 ?2334 02/15/22 ?0093  ?WBC 5.3 5.2  ?NEUTROABS 3.2 3.1  ?HGB 9.0* 7.8*  ?HCT 28.1* 24.9*  ?MCV 82.2 82.7  ?PLT 411* 347  ? ?Basic Metabolic Panel: ?Recent Labs  ?Lab 02/14/22 ?2334 02/15/22 ?8182  ?NA 125* 128*  ?K 3.0* 3.0*  ?CL 89* 92*  ?CO2 27 24  ?GLUCOSE 227* 251*  ?BUN 6* <5*  ?CREATININE 0.68 0.58  ?CALCIUM 8.3* 7.7*  ?MG  --  1.7  ?PHOS  --  1.5*  ? ?GFR: ?Estimated Creatinine Clearance: 83.2 mL/min (by C-G formula based on SCr of 0.58 mg/dL). ?Liver Function Tests: ?Recent Labs  ?Lab 02/14/22 ?2334  ?AST 11*  ?ALT 14  ?ALKPHOS 94  ?BILITOT 0.3  ?PROT 6.1*  ?ALBUMIN 1.6*  ? ?No results for input(s): LIPASE, AMYLASE in the last 168 hours. ?No results for input(s): AMMONIA in the last 168 hours. ?Coagulation Profile: ?Recent Labs  ?Lab 02/14/22 ?2334  ?INR 1.2  ? ?Cardiac Enzymes: ?No results for input(s): CKTOTAL, CKMB, CKMBINDEX, TROPONINI in the last 168 hours. ?BNP (last 3 results) ?No results for input(s): PROBNP in the last 8760 hours. ?HbA1C: ?No results for input(s): HGBA1C in the last 72 hours. ?CBG: ?Recent Labs  ?Lab 02/15/22 ?0810  ?GLUCAP 190*  ? ?Lipid Profile: ?No results for input(s): CHOL, HDL, LDLCALC, TRIG, CHOLHDL, LDLDIRECT in the last 72 hours. ?Thyroid Function Tests: ?No results for input(s): TSH, T4TOTAL, FREET4, T3FREE, THYROIDAB in the last 72 hours. ?Anemia  Panel: ?No results for input(s): VITAMINB12, FOLATE, FERRITIN, TIBC, IRON, RETICCTPCT in the last 72 hours. ?Sepsis Labs: ?Recent Labs  ?Lab 02/14/22 ?2334 02/15/22 ?0615  ?LATICACIDVEN 1.4 1.4  ? ? ?Recent Results (from the past 240 hour(s))  ?Culture, blood (Routine x 2)     Status: None (Preliminary result)  ? Collection Time: 02/14/22 11:34 PM  ? Specimen: BLOOD  ?Result Value Ref Range Status  ? Specimen Description BLOOD LEFT ANTECUBITAL  Final  ? Special Requests   Final  ?  BOTTLES DRAWN AEROBIC AND ANAEROBIC Blood Culture adequate volume  ? Culture   Final  ?  NO GROWTH < 12 HOURS ?Performed at [facility], [place] [address], [place], [place] [address] ?  ? Report Status PENDING  Incomplete  ?Culture, blood (Routine x 2)     Status: None (Preliminary result)  ? Collection Time: 02/14/22 11:34 PM  ? Specimen: BLOOD  ?Result Value Ref Range Status  ? Specimen Description BLOOD RIGHT ANTECUBITAL  Final  ? Special Requests   Final  ?  BOTTLES DRAWN AEROBIC AND ANAEROBIC Blood Culture adequate volume  ? Culture   Final  ?  NO GROWTH < 12 HOURS ?Performed at [facility], [place] [address], [place], [place] [address] ?  ? Report Status PENDING  Incomplete  ?MRSA Next Gen by PCR, Nasal     Status: None  ? Collection Time: 02/15/22  6:57 AM  ? Specimen: Panniculus; Nasal Swab  ?Result Value Ref Range Status  ? MRSA by PCR Next Gen NOT DETECTED NOT DETECTED Final  ?  Comment: (NOTE) ?The GeneXpert MRSA Assay (FDA approved for NASAL specimens only), ?is one component of a comprehensive MRSA colonization surveillance ?program. It is not intended to diagnose MRSA infection nor to guide ?or monitor treatment for MRSA infections. ?Test performance is not FDA approved in patients less than 2 years ?old. ?Performed at [facility], [place] [address], [place], [place] ?[address] ?  ?  ? ? ? ? ? ?Radiology Studies: ?DG Chest 2 View ? ?Result Date: 02/15/2022 ?CLINICAL DATA:  Suspected sepsis EXAM: CHEST - 2 VIEW  COMPARISON:  11/05/2020 FINDINGS: The heart size and mediastinal contours are within normal limits. Both lungs are clear. The visualized skeletal structures are unremarkable. IMPRESSION: No active cardiopulmonary di

## 2022-02-15 NOTE — H&P (Addendum)
?History and Physical  ? ? ?Patient: [name] [name] RCV:[id number] DOB: Jul 24, 1951 ?DOA: 02/14/2022 ?DOS: the patient was seen and examined on 02/15/2022 ?PCP: Virginia Crews, MD  ?Patient coming from: Home ? ?Chief Complaint:  ?Chief Complaint  ?Patient presents with  ? Fall  ? ?HPI: [name] [name] is a 71 y.o. female with medical history significant of DM2, HTN, obesity s/p liposuction and panniculectomy on 02/05/22. ? ?She presents today after a fall at home.  Patient reports that she awoke from a nap around 9:30p this evening.  While getting up, she reports losing her balance which caused her to fall onto the ground.  She denies any head trauma or loss of consciousness.  She denies any associated dizziness.  No chest pain or shortness of breath.  No recent nausea or vomiting.  Patient is having abdominal wall discomfort related to the surgery but no overt pain.  She reports that she is hydrating well, and advancing her diet slowly. ? ?During fall: R JP drain pulled, and bulb from L JP drain disconnected. ? ?Denies dizziness or lightheadedness. ? ?Seen by plastic surgery in clinic on 3/13 -> pt fatigued and had fever of 101 this past weekend.  During clinic visit had to drain ~2L from abd wall JP drains. ? ?On keflex since clinic visit. ?  ?Review of Systems: As mentioned in the history of present illness. All other systems reviewed and are negative. ?Past Medical History:  ?Diagnosis Date  ? Anxiety   ? Arthritis   ? Rhumetoid arthritis  ? BRCA negative 07/2017  ? MyRisk neg  ? Cancer [facility])   ? uterine  ? Cellulitis of both lower extremities   ? Chronic  ? CHF (congestive heart failure) ([place])   ? Coronary artery disease   ? Depression   ? Diabetes mellitus without complication ([place])   ? Family history of breast cancer 07/2017  ? MyRisk neg; IBIS=10.5%/riskscore=17%  ? Family history of ovarian cancer   ? Fibromyalgia   ? Fibromyalgia affecting shoulder region   ? Headache   ? Hyperlipidemia   ? Hypertension   ?  Spinal stenosis   ? ?Past Surgical History:  ?Procedure Laterality Date  ? ABLATION SAPHENOUS VEIN W/ RFA    ? ANAL FISSURECTOMY  01/2004  ? COLON SURGERY    ? CORONARY ANGIOPLASTY    ? CYSTOSCOPY  09/03/2017  ? Procedure: CYSTOSCOPY;  Surgeon: Gae Dry, MD;  Location: [facility] ORS;  Service: Gynecology;;  ? DILATION AND CURETTAGE OF UTERUS    ? EYE SURGERY Bilateral 08/2020  ? eye lift  ? HERNIA REPAIR    ? LAPAROSCOPIC HYSTERECTOMY N/A 09/03/2017  ? Procedure: HYSTERECTOMY TOTAL LAPAROSCOPIC BSO;  Surgeon: Gae Dry, MD;  Location: [facility] OR[facility];  Service: Gynecology;  Laterality: N/A;  ? MEDIAL PARTIAL KNEE REPLACEMENT Left 01/06/2014  ? PANNICULECTOMY N/A 02/05/2022  ? Procedure: PANNICULECTOMY;  Surgeon: Wallace Going, DO;  Location: [facility];  Service: Plastics;  Laterality: N/A;  3 hours  ? UMBILICAL HERNIA REPAIR N/A 09/05/2015  ? Procedure: HERNIA REPAIR incarcerated UMBILICAL ADULT;  Surgeon: Sherri Rad, MD;  Location: [facility] OR[facility];  Service: General;  Laterality: N/A;  ? Vascular Stent  11/04/2013  ? ?Social History:  reports that she quit smoking about 25 years ago. Her smoking use included cigarettes. She has a 29.00 pack-year smoking history. She has never used smokeless tobacco. She reports that she does not drink alcohol and does not use drugs. ? ?Allergies  ?  Allergen Reactions  ? Sulfa Antibiotics Itching, Rash and Swelling  ? Latex Itching  ? Doxycycline Rash  ? Empagliflozin Itching and Other (See Comments)  ?  Itching and frequent yeast infections  ? ? ?Family History  ?Problem Relation Age of Onset  ? Hypertension Mother   ? Cataracts Mother   ? Thyroid disease Mother   ? Dementia Mother   ? COPD Father   ? Dementia Father   ? Cataracts Father   ? Aneurysm Father   ?     Abdominal & Brain  ? Diabetes Father   ? Melanoma Father   ? Breast cancer Sister 48  ? Fibromyalgia Sister   ? Migraines Sister   ? Hypertension Sister   ? Breast cancer Sister 29  ? COPD Sister   ? Ovarian cancer Sister 39   ? Migraines Brother   ? Heart attack Brother   ? Colon cancer Paternal Uncle   ?     49s  ? Colon cancer Paternal Uncle   ?     12s  ? Uterine cancer Cousin   ? Uterine cancer Cousin   ? ? ?Prior to Admission medications   ?Medication Sig Start Date End Date Taking? Authorizing Provider  ?acetaminophen (TYLENOL) 325 MG tablet Take 650 mg by mouth every 6 (six) hours as needed for moderate pain.   Yes [provider]  ?Biotin w/ Vitamins C & E (HAIR/SKIN/NAILS PO) Take 1 tablet by mouth daily.   Yes [provider]  ?buPROPion (WELLBUTRIN XL) 300 MG 24 hr tablet TAKE 1 TABLET BY MOUTH EVERY DAY ?Patient taking differently: Take 300 mg by mouth daily. 09/11/21  Yes Bacigalupo, Dionne Bucy, MD  ?carvedilol (COREG) 6.25 MG tablet Take 1 tablet (6.25 mg total) by mouth 2 (two) times daily with a meal. 03/07/21  Yes Bacigalupo, Dionne Bucy, MD  ?cephALEXin (KEFLEX) 500 MG capsule Take 1 capsule (500 mg total) by mouth 4 (four) times daily for 5 days. 02/12/22 02/17/22 Yes Scheeler, Carola Rhine, PA-C  ?clonazePAM (KLONOPIN) 0.5 MG tablet Take 0.5-1 tablets (0.25-0.5 mg total) by mouth 2 (two) times daily as needed for anxiety. 03/07/21  Yes Bacigalupo, Dionne Bucy, MD  ?DULoxetine (CYMBALTA) 60 MG capsule TAKE 1 CAPSULE BY MOUTH EVERY DAY ?Patient taking differently: Take 60 mg by mouth daily. 10/20/21  Yes Bacigalupo, Dionne Bucy, MD  ?ezetimibe (ZETIA) 10 MG tablet TAKE 1 TABLET BY MOUTH EVERY DAY ?Patient taking differently: Take 10 mg by mouth daily. 12/16/21  Yes Bacigalupo, Dionne Bucy, MD  ?losartan (COZAAR) 25 MG tablet TAKE 1 TABLET (25 MG TOTAL) BY MOUTH DAILY. 01/15/22  Yes Bacigalupo, Dionne Bucy, MD  ?metFORMIN (GLUCOPHAGE) 500 MG tablet TAKE 1 TABLET BY MOUTH 2 TIMES DAILY WITH A MEAL. ?Patient taking differently: Take 500 mg by mouth 2 (two) times daily with a meal. 10/20/21  Yes Bacigalupo, Dionne Bucy, MD  ?metoprolol succinate (TOPROL-XL) 25 MG 24 hr tablet Take 25 mg by mouth daily. 08/02/21  Yes [provider]  ?ondansetron (ZOFRAN-ODT) 4 MG disintegrating tablet Take 1 tablet (4 mg total) by mouth every 8 (eight) hours as needed for nausea or vomiting. 01/17/22  Yes Corena Herter, PA-C  ?pregabalin (LYRICA) 75 MG capsule Take 1 capsule (75 mg total) by mouth 3 (three) times daily. 07/25/21  Yes Bacigalupo, Dionne Bucy, MD  ?tolterodine (DETROL LA) 4 MG 24 hr capsule Take 1 capsule (4 mg total) by mouth daily. 03/06/21  Yes Bjorn Loser, MD  ?  Vibegron (GEMTESA) 75 MG TABS Take 75 mg by mouth daily. 06/19/21  Yes Bjorn Loser, MD  ? ? ?Physical Exam: ?Vitals:  ? 02/14/22 2246 02/14/22 2253 02/15/22 0147 02/15/22 0200  ?BP: (!) 89/52 (!) 116/50 (!) 125/52 96/71  ?Pulse: 97 95 97 94  ?Resp: (!) '23 20 19 ' (!) 23  ?Temp:      ?TempSrc:      ?SpO2: 96% 97% 99% 99%  ?Weight:      ?Height:      ? ?Constitutional: NAD, calm, comfortable ?Eyes: PERRL, lids and conjunctivae normal ?ENMT: Mucous membranes are moist. Posterior pharynx clear of any exudate or lesions.Normal dentition.  ?Neck: normal, supple, no masses, no thyromegaly ?Respiratory: clear to auscultation bilaterally, no wheezing, no crackles. Normal respiratory effort. No accessory muscle use.  ?Cardiovascular: Regular rate and rhythm, no murmurs / rubs / gallops. No extremity edema. 2+ pedal pulses. No carotid bruits.  ?Abdomen: no tenderness, no masses palpated. No hepatosplenomegaly. Bowel sounds positive.  ?Musculoskeletal: no clubbing / cyanosis. No joint deformity upper and lower extremities. Good ROM, no contractures. Normal muscle tone.  ?Skin: 2-3cm eschar just R of midline.  Surgical incision present.  L JP drain with milky substance draining, R JP drain incision site draining copious milky substance. ?Neurologic: CN 2-12 grossly intact. Sensation intact, DTR normal. Strength 5/5 in all 4.  ?Psychiatric: Normal judgment and insight. Alert and oriented x 3. Normal mood.  ? ?Data Reviewed: ? ?HGB 9 down from 11. ? ?CBC ?   ?Component Value  Date/Time  ? WBC 5.3 02/14/2022 2334  ? RBC 3.42 (L) 02/14/2022 2334  ? HGB 9.0 (L) 02/14/2022 2334  ? HGB 11.6 04/17/2021 0947  ? HCT 28.1 (L) 02/14/2022 2334  ? HCT 36.7 04/17/2021 0947  ? PLT 411 (H) 02/14/2022

## 2022-02-15 NOTE — Assessment & Plan Note (Signed)
Hold metformin ?Mod scale SSI AC while here. ?

## 2022-02-15 NOTE — TOC Initial Note (Signed)
Transition of Care (TOC) - Initial/Assessment Note  ? ? ?Patient Details  ?Name: [name] [name] ?MRN: [id number] ?Date of Birth: 1951-03-04 ? ?Transition of Care (TOC) CM/SW Contact:    ?Tom-Johnson, Renea Ee, RN ?Phone Number: ?02/15/2022, 4:14 PM ? ?Clinical Narrative:                 ? ?CM spoke with patient at bedside about needs for post hospital transition. Admitted for Post operative infection. Had a a fall at home and recent Liposuction and panniculectomy on 01/622.  ?From home alone. Has a daughter whom lives at [facility]. Has six siblings whom are supportive of care. Patient's mother is at a SNF with Alzheimer's. Independent with care and drives self. Ha a cane, walker and grab bars in bathroom. PCP is Bacigalupo, Dionne Bucy, MD and [facility] [address] in [place]. ?No TOC recommendations or needs noted at this time. CM will continue to follow with needs. ?  ?  ? ? ?Patient Goals and CMS Choice ?  ?  ?  ? ?Expected Discharge Plan and Services ?  ?  ?  ?  ?  ?                ?  ?  ?  ?  ?  ?  ?  ?  ?  ?  ? ?Prior Living Arrangements/Services ?  ?  ?  ?       ?  ?  ?  ?  ? ?Activities of Daily Living ?  ?  ? ?Permission Sought/Granted ?  ?  ?   ?   ?   ?   ? ?Emotional Assessment ?  ?  ?  ?  ?  ?  ? ?Admission diagnosis:  Hyponatremia [E87.1] ?Post op infection [T81.40XA] ?Patient Active Problem List  ? Diagnosis Date Noted  ? Hyponatremia 02/15/2022  ? Hypoalbuminemia 02/15/2022  ? Post op infection 02/15/2022  ?  Class: Question of  ? Anemia associated with acute blood loss 02/15/2022  ? Hypokalemia 02/15/2022  ? Hypotension 02/15/2022  ? Right arm pain 10/19/2021  ? Housing instability 03/28/2021  ? Senile purpura ([place]) 03/07/2021  ? Acute anxiety 03/07/2021  ? Panniculitis 02/03/2021  ? Myalgia due to statin 12/26/2020  ? Telogen effluvium 12/26/2020  ? Candidal intertrigo 12/06/2020  ? Microcytic anemia 12/06/2020  ? Polyneuropathy associated with underlying disease ([place])  02/03/2020  ? Grief 10/20/2019  ? Mixed stress and urge urinary incontinence 08/21/2019  ? Avitaminosis D 07/15/2019  ? Drug-induced constipation 06/12/2019  ? General weakness 07/09/2018  ? Lumbar spondylosis 07/08/2018  ? Chronic bilateral low back pain with bilateral sciatica 07/08/2018  ? Chronic pain syndrome 07/08/2018  ? Urinary incontinence 03/19/2018  ? 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 Atypical endometrial hyperplasia 08/15/2017  ? Lymphedema 01/31/2017  ? Hyperlipidemia associated with type 2 diabetes mellitus ([place]) 12/14/2016  ? Hypertension associated with diabetes ([place]) 12/14/2016  ? Chronic systolic heart failure ([place]) 10/06/2015  ? History of MI (myocardial infarction) 09/20/2015  ? Allergic rhinitis 07/06/2015  ? Chronic venous insufficiency 07/06/2015  ? Atherosclerosis of coronary artery 07/06/2015  ? MDD (major depressive disorder) 07/06/2015  ? Fibromyalgia 07/06/2015  ? Encounter for long-term (current) use of other medications 07/26/2014  ? Diabetes ([place]) 04/08/2014  ? Migraines 04/08/2014  ? Gastroduodenal ulcer 04/08/2014  ? Venous stasis 04/08/2014  ? Osteoarthritis 04/08/2014  ? Coronary artery disease 04/08/2014  ? Peptic ulcer disease 04/08/2014  ?  Obesity 04/08/2014  ? Rheumatoid arthritis ([place]) 04/06/2014  ? ?PCP:  Virginia Crews, MD ?Pharmacy:   ?[id number] [id number]?Phone: [phone]: [phone]? ?[facility] [id number]?Phone: [phone]: [phone]? ? ? ? ?Social Determinants of Health (SDOH) Interventions ?  ? ?Readmission Risk Interventions ?No flowsheet data found. ? ? ?

## 2022-02-15 NOTE — Assessment & Plan Note (Addendum)
-  suspect will improve as patient is now eating ?

## 2022-02-15 NOTE — Assessment & Plan Note (Addendum)
Post op anemia, HGB 9 down from 11. ?Monitor CBC for now, will hold off on transfusion.[name] ?No report of ongoing bleeding at this point. ?

## 2022-02-15 NOTE — Telephone Encounter (Signed)
See the first note on (02/07/22).//AB/CMA ?

## 2022-02-15 NOTE — Consult Note (Signed)
Reason for Consult:Drain out ?Referring Physician: Barb Merino, MD ? ?[name] [name] is an 71 y.o. female.  ?HPI: The patient is a 71 yrs old female here for treatment after falling at home.  She underwent a panniculectomy 3/6.  She was seen in the office 2 days ago.  Her temp was 101. She was not eating well.  It appeared that the drains were not charged.  There was no redness.  Swelling noted and 2 gm of fluid drained.  ED was suggested but patient did not want to go.  Then presented to the ED this morning after a fall.  The right drain was out and the left one has no bulb.   ? ?Past Medical History:  ?Diagnosis Date  ? Anxiety   ? Arthritis   ? Rhumetoid arthritis  ? BRCA negative 07/2017  ? MyRisk neg  ? Cancer [facility])   ? uterine  ? Cellulitis of both lower extremities   ? Chronic  ? CHF (congestive heart failure) ([place])   ? Coronary artery disease   ? Depression   ? Diabetes mellitus without complication ([place])   ? Family history of breast cancer 07/2017  ? MyRisk neg; IBIS=10.5%/riskscore=17%  ? Family history of ovarian cancer   ? Fibromyalgia   ? Fibromyalgia affecting shoulder region   ? Headache   ? Hyperlipidemia   ? Hypertension   ? Spinal stenosis   ? ? ?Past Surgical History:  ?Procedure Laterality Date  ? ABLATION SAPHENOUS VEIN W/ RFA    ? ANAL FISSURECTOMY  01/2004  ? COLON SURGERY    ? CORONARY ANGIOPLASTY    ? CYSTOSCOPY  09/03/2017  ? Procedure: CYSTOSCOPY;  Surgeon: Gae Dry, MD;  Location: [facility] ORS;  Service: Gynecology;;  ? DILATION AND CURETTAGE OF UTERUS    ? EYE SURGERY Bilateral 08/2020  ? eye lift  ? HERNIA REPAIR    ? LAPAROSCOPIC HYSTERECTOMY N/A 09/03/2017  ? Procedure: HYSTERECTOMY TOTAL LAPAROSCOPIC BSO;  Surgeon: Gae Dry, MD;  Location: [facility] ORS;  Service: Gynecology;  Laterality: N/A;  ? MEDIAL PARTIAL KNEE REPLACEMENT Left 01/06/2014  ? PANNICULECTOMY N/A 02/05/2022  ? Procedure: PANNICULECTOMY;  Surgeon: Wallace Going, DO;  Location: [place];  Service: Plastics;   Laterality: N/A;  3 hours  ? UMBILICAL HERNIA REPAIR N/A 09/05/2015  ? Procedure: HERNIA REPAIR incarcerated UMBILICAL ADULT;  Surgeon: Sherri Rad, MD;  Location: [facility] ORS;  Service: General;  Laterality: N/A;  ? Vascular Stent  11/04/2013  ? ? ?Family History  ?Problem Relation Age of Onset  ? Hypertension Mother   ? Cataracts Mother   ? Thyroid disease Mother   ? Dementia Mother   ? COPD Father   ? Dementia Father   ? Cataracts Father   ? Aneurysm Father   ?     Abdominal & Brain  ? Diabetes Father   ? Melanoma Father   ? Breast cancer Sister 82  ? Fibromyalgia Sister   ? Migraines Sister   ? Hypertension Sister   ? Breast cancer Sister 63  ? COPD Sister   ? Ovarian cancer Sister 80  ? Migraines Brother   ? Heart attack Brother   ? Colon cancer Paternal Uncle   ?     62s  ? Colon cancer Paternal Uncle   ?     59s  ? Uterine cancer Cousin   ? Uterine cancer Cousin   ? ? ?Social History:  reports that she quit smoking  about 25 years ago. Her smoking use included cigarettes. She has a 29.00 pack-year smoking history. She has never used smokeless tobacco. She reports that she does not drink alcohol and does not use drugs. ? ?Allergies:  ?Allergies  ?Allergen Reactions  ? Sulfa Antibiotics Itching, Rash and Swelling  ? Latex Itching  ? Doxycycline Rash  ? Empagliflozin Itching and Other (See Comments)  ?  Itching and frequent yeast infections  ? ? ?Medications: I have reviewed the patient's current medications. ? ?Results for orders placed or performed during the hospital encounter of 02/14/22 (from the past 48 hour(s))  ?Comprehensive metabolic panel     Status: Abnormal  ? Collection Time: 02/14/22 11:34 PM  ?Result Value Ref Range  ? Sodium 125 (L) 135 - 145 mmol/L  ? Potassium 3.0 (L) 3.5 - 5.1 mmol/L  ? Chloride 89 (L) 98 - 111 mmol/L  ? CO2 27 22 - 32 mmol/L  ? Glucose, Bld 227 (H) 70 - 99 mg/dL  ?  Comment: Glucose reference range applies only to samples taken after fasting for at least 8 hours.  ? BUN 6 (L) 8 -  23 mg/dL  ? Creatinine, Ser 0.68 0.44 - 1.00 mg/dL  ? Calcium 8.3 (L) 8.9 - 10.3 mg/dL  ? Total Protein 6.1 (L) 6.5 - 8.1 g/dL  ? Albumin 1.6 (L) 3.5 - 5.0 g/dL  ? AST 11 (L) 15 - 41 U/L  ? ALT 14 0 - 44 U/L  ? Alkaline Phosphatase 94 38 - 126 U/L  ? Total Bilirubin 0.3 0.3 - 1.2 mg/dL  ? GFR, Estimated >60 >60 mL/min  ?  Comment: (NOTE) ?Calculated using the CKD-EPI Creatinine Equation (2021) ?  ? Anion gap 9 5 - 15  ?  Comment: Performed at [facility], [place] [address] [place], [place] [address]  ?Lactic acid, plasma     Status: None  ? Collection Time: 02/14/22 11:34 PM  ?Result Value Ref Range  ? Lactic Acid, Venous 1.4 0.5 - 1.9 mmol/L  ?  Comment: Performed at [facility], [place] [address] [place], [place] [address]  ?CBC with Differential     Status: Abnormal  ? Collection Time: 02/14/22 11:34 PM  ?Result Value Ref Range  ? WBC 5.3 4.0 - 10.5 K/uL  ? RBC 3.42 (L) 3.87 - 5.11 MIL/uL  ? Hemoglobin 9.0 (L) 12.0 - 15.0 g/dL  ? HCT 28.1 (L) 36.0 - 46.0 %  ? MCV 82.2 80.0 - 100.0 fL  ? MCH 26.3 26.0 - 34.0 pg  ? MCHC 32.0 30.0 - 36.0 g/dL  ? RDW 14.6 11.5 - 15.5 %  ? Platelets 411 (H) 150 - 400 K/uL  ? nRBC 0.4 (H) 0.0 - 0.2 %  ? Neutrophils Relative % 59 %  ? Neutro Abs 3.2 1.7 - 7.7 K/uL  ? Lymphocytes Relative 17 %  ? Lymphs Abs 0.9 0.7 - 4.0 K/uL  ? Monocytes Relative 15 %  ? Monocytes Absolute 0.8 0.1 - 1.0 K/uL  ? Eosinophils Relative 4 %  ? Eosinophils Absolute 0.2 0.0 - 0.5 K/uL  ? Basophils Relative 1 %  ? Basophils Absolute 0.0 0.0 - 0.1 K/uL  ? WBC Morphology MILD LEFT SHIFT (1-5% METAS, OCC MYELO, OCC BANDS)   ? RBC Morphology MORPHOLOGY UNREMARKABLE   ? Smear Review Normal platelet morphology   ? Immature Granulocytes 4 %  ? Abs Immature Granulocytes 0.21 (H) 0.00 - 0.07 K/uL  ?  Comment: Performed at  [facility], [place] [address], [place], [place] [address]  ?Protime-INR     Status: None  ? Collection Time: 02/14/22 11:34 PM  ?Result Value Ref Range  ? Prothrombin Time 14.9 11.4 -  15.2 seconds  ? INR 1.2 0.8 - 1.2  ?  Comment: (NOTE) ?INR goal varies based on device and disease states. ?Performed at [facility], [place] [address], [place], [place] ?[address] ?  ?Culture, blood (Routine x 2)     Status: None (Preliminary result)  ? Collection Time: 02/14/22 11:34 PM  ? Specimen: BLOOD  ?Result Value Ref Range  ? Specimen Description BLOOD LEFT ANTECUBITAL   ? Special Requests    ?  BOTTLES DRAWN AEROBIC AND ANAEROBIC Blood Culture adequate volume  ? Culture    ?  NO GROWTH < 12 HOURS ?Performed at [facility], [place] [address], [place], [place] [address] ?  ? Report Status PENDING   ?Culture, blood (Routine x 2)     Status: None (Preliminary result)  ? Collection Time: 02/14/22 11:34 PM  ? Specimen: BLOOD  ?Result Value Ref Range  ? Specimen Description BLOOD RIGHT ANTECUBITAL   ? Special Requests    ?  BOTTLES DRAWN AEROBIC AND ANAEROBIC Blood Culture adequate volume  ? Culture    ?  NO GROWTH < 12 HOURS ?Performed at [facility], [place] [address] [place], [place] [address] ?  ? Report Status PENDING   ?Type and screen [place]     Status: None (Preliminary result)  ? Collection Time: 02/15/22  2:01 AM  ?Result Value Ref Range  ? ABO/RH(D) A POS   ? Antibody Screen NEG   ? Sample Expiration 02/18/2022,2359   ? Unit Number [id number]   ? Blood Component Type RBC LR PHER1   ? Unit division 00   ? Status of Unit ALLOCATED   ? Transfusion Status OK TO TRANSFUSE   ? Crossmatch Result Compatible   ? Unit Number [id number]   ? Blood Component Type RED CELLS,LR   ? Unit division 00   ? Status of Unit DISCARDED   ? Transfusion Status OK TO TRANSFUSE   ? Crossmatch Result    ?  Compatible ?Performed at [facility], [place] [address] [place], [place] [address] ?  ?Prepare RBC (crossmatch)     Status: None  ? Collection Time: 02/15/22  2:52 AM  ?Result Value Ref Range  ? Order Confirmation    ?  ORDER PROCESSED BY BLOOD BANK ?Performed at [place], [place] [address], [place], [place] [address] ?  ?Lactic acid, plasma     Status: None  ? Collection Time: 02/15/22  6:15 AM  ?Result Value Ref Range  ? Lactic Acid, Venous 1.4 0.5 - 1.9 mmol/L  ?  Comme

## 2022-02-15 NOTE — Assessment & Plan Note (Addendum)
CT scan consistent with postop findings, subcutaneous air but no drainable fluid collection.  WBC count was normal.  She was on Keflex as outpatient.  ?Blood cultures negative so far. Culture from JP drain with mixed organism, [place].  MRSA swab negative.  ?Repeat CT scan 3/17, stable findings.  No drainable collection.  ?Received 5 days of IV antibiotics.  No evidence of purulence or collection, will change to Augmentin for broader spectrum coverage to treat for total 14 days.  Clinically improving. ?-seen by plastics- U/S did not show large collection but did show 2 small ?? ?

## 2022-02-15 NOTE — Progress Notes (Signed)
Patient refused to get up for orthostatic VS, said she was weak and is in pain whenever she moves.  ?

## 2022-02-16 ENCOUNTER — Inpatient Hospital Stay (HOSPITAL_COMMUNITY): Payer: Medicare Other

## 2022-02-16 ENCOUNTER — Encounter: Payer: Medicare Other | Admitting: Surgical

## 2022-02-16 DIAGNOSIS — E876 Hypokalemia: Secondary | ICD-10-CM | POA: Diagnosis not present

## 2022-02-16 DIAGNOSIS — T8140XA Infection following a procedure, unspecified, initial encounter: Secondary | ICD-10-CM | POA: Diagnosis not present

## 2022-02-16 DIAGNOSIS — D62 Acute posthemorrhagic anemia: Secondary | ICD-10-CM | POA: Diagnosis not present

## 2022-02-16 DIAGNOSIS — E871 Hypo-osmolality and hyponatremia: Secondary | ICD-10-CM | POA: Diagnosis not present

## 2022-02-16 LAB — OCCULT BLOOD GASTRIC / DUODENUM (SPECIMEN CUP): pH, Gastric: POSITIVE

## 2022-02-16 LAB — CBC WITH DIFFERENTIAL/PLATELET
Abs Immature Granulocytes: 0 10*3/uL (ref 0.00–0.07)
Basophils Absolute: 0 10*3/uL (ref 0.0–0.1)
Basophils Relative: 0 %
Eosinophils Absolute: 0.1 10*3/uL (ref 0.0–0.5)
Eosinophils Relative: 1 %
HCT: 27.6 % — ABNORMAL LOW (ref 36.0–46.0)
Hemoglobin: 8.8 g/dL — ABNORMAL LOW (ref 12.0–15.0)
Lymphocytes Relative: 12 %
Lymphs Abs: 0.8 10*3/uL (ref 0.7–4.0)
MCH: 25.9 pg — ABNORMAL LOW (ref 26.0–34.0)
MCHC: 31.9 g/dL (ref 30.0–36.0)
MCV: 81.2 fL (ref 80.0–100.0)
Monocytes Absolute: 0.3 10*3/uL (ref 0.1–1.0)
Monocytes Relative: 4 %
Neutro Abs: 5.4 10*3/uL (ref 1.7–7.7)
Neutrophils Relative %: 83 %
Platelets: 400 10*3/uL (ref 150–400)
RBC: 3.4 MIL/uL — ABNORMAL LOW (ref 3.87–5.11)
RDW: 15 % (ref 11.5–15.5)
WBC: 6.5 10*3/uL (ref 4.0–10.5)
nRBC: 0.3 % — ABNORMAL HIGH (ref 0.0–0.2)
nRBC: 1 /100 WBC — ABNORMAL HIGH

## 2022-02-16 LAB — BASIC METABOLIC PANEL
Anion gap: 13 (ref 5–15)
BUN: 6 mg/dL — ABNORMAL LOW (ref 8–23)
CO2: 23 mmol/L (ref 22–32)
Calcium: 8 mg/dL — ABNORMAL LOW (ref 8.9–10.3)
Chloride: 95 mmol/L — ABNORMAL LOW (ref 98–111)
Creatinine, Ser: 0.58 mg/dL (ref 0.44–1.00)
GFR, Estimated: >60 mL/min (ref >60)
Glucose, Bld: 304 mg/dL — ABNORMAL HIGH (ref 70–99)
Potassium: 3.1 mmol/L — ABNORMAL LOW (ref 3.5–5.1)
Sodium: 131 mmol/L — ABNORMAL LOW (ref 135–145)

## 2022-02-16 LAB — PHOSPHORUS: Phosphorus: 2.9 mg/dL (ref 2.5–4.6)

## 2022-02-16 LAB — GLUCOSE, CAPILLARY
Glucose-Capillary: 255 mg/dL — ABNORMAL HIGH (ref 70–99)
Glucose-Capillary: 178 mg/dL — ABNORMAL HIGH (ref 70–99)
Glucose-Capillary: 104 mg/dL — ABNORMAL HIGH (ref 70–99)

## 2022-02-16 LAB — MAGNESIUM: Magnesium: 2.1 mg/dL (ref 1.7–2.4)

## 2022-02-16 MED ORDER — TRIMETHOBENZAMIDE HCL 100 MG/ML IM SOLN
200.0000 mg | Freq: Once | INTRAMUSCULAR | Status: AC
  Administered 2022-02-16: 200 mg via INTRAMUSCULAR
  Filled 2022-02-16: qty 2

## 2022-02-16 MED ORDER — MORPHINE SULFATE (PF) 4 MG/ML IV SOLN
4.0000 mg | INTRAVENOUS | Status: DC | PRN
  Administered 2022-02-17: 4 mg via INTRAVENOUS
  Filled 2022-02-16: qty 1

## 2022-02-16 MED ORDER — POTASSIUM CHLORIDE 10 MEQ/100ML IV SOLN
10.0000 meq | INTRAVENOUS | Status: DC
  Administered 2022-02-16 (×3): 10 meq via INTRAVENOUS
  Filled 2022-02-16 (×3): qty 100

## 2022-02-16 MED ORDER — ONDANSETRON HCL 4 MG/2ML IJ SOLN
4.0000 mg | Freq: Four times a day (QID) | INTRAMUSCULAR | Status: DC | PRN
  Administered 2022-02-16: 4 mg via INTRAVENOUS
  Filled 2022-02-16: qty 2

## 2022-02-16 MED ORDER — POLYETHYLENE GLYCOL 3350 17 G PO PACK
17.0000 g | PACK | Freq: Every day | ORAL | Status: DC
  Administered 2022-02-16 – 2022-02-23 (×6): 17 g via ORAL
  Filled 2022-02-16 (×8): qty 1

## 2022-02-16 MED ORDER — MORPHINE SULFATE (PF) 2 MG/ML IV SOLN
1.0000 mg | INTRAVENOUS | Status: DC | PRN
Start: 2022-02-16 — End: 2022-02-16

## 2022-02-16 MED ORDER — POTASSIUM CHLORIDE IN NACL 40-0.9 MEQ/L-% IV SOLN
INTRAVENOUS | Status: DC
Start: 2022-02-16 — End: 2022-02-17
  Filled 2022-02-16 (×4): qty 1000

## 2022-02-16 NOTE — Evaluation (Signed)
Physical Therapy Evaluation ?Patient Details ?Name: [name] [name] ?MRN: [id number] ?DOB: 08-23-1951 ?Today's Date: 02/16/2022 ? ?History of Present Illness ? 71 y/o female presented to ED on 02/14/22 following fall where she felt lightheaded and hit head. PMH: HTN, DM2, obesity s/p liposuction and panniculectomy on 02/05/22, CHF, uterine cancer  ?Clinical Impression ? Patient admitted with above. Patient presents with generalized weakness, decreased activity tolerance, impaired balance, and pain. Patient limited by pain this session. Obtained orthostatic vitals which were negative, see below. Patient required modA for bed mobility and minA for sit to stand transfer. Deferred further mobiltiy due to pain and fatigue. Patient will benefit from skilled PT services during acute stay to address listed deficits. Recommend HHPT at discharge to maximize functional independence and assist with return to PLOF.    ?   ? ?Recommendations for follow up therapy are one component of a multi-disciplinary discharge planning process, led by the attending physician.  Recommendations may be updated based on patient status, additional functional criteria and insurance authorization. ? ?Follow Up Recommendations Home health PT ? ?  ?Assistance Recommended at Discharge Intermittent Supervision/Assistance  ?Patient can return home with the following ?   ? ?  ?Equipment Recommendations None recommended by PT  ?Recommendations for Other Services ?    ?  ?Functional Status Assessment Patient has had a recent decline in their functional status and demonstrates the ability to make significant improvements in function in a reasonable and predictable amount of time.  ? ?  ?Precautions / Restrictions Precautions ?Precautions: Fall ?Precaution Comments: JP drain on L ?Restrictions ?Weight Bearing Restrictions: No  ? ?  ? ?Mobility ? Bed Mobility ?Overal bed mobility: Needs Assistance ?Bed Mobility: Rolling, Sidelying to Sit, Sit to Sidelying ?Rolling:  Min guard ?Sidelying to sit: Mod assist ?  ?  ?Sit to sidelying: Min guard ?General bed mobility comments: modA for trunk elevation to EOB. cues for log roll technique due to abdominal incisions ?  ? ?Transfers ?Overall transfer level: Needs assistance ?Equipment used: 1 person hand held assist ?Transfers: Sit to/from Stand ?Sit to Stand: Min assist ?  ?  ?  ?  ?  ?General transfer comment: minA to stand from EOB and steady ?  ? ?Ambulation/Gait ?Ambulation/Gait assistance: Min guard ?  ?Assistive device: 1 person hand held assist ?  ?  ?  ?Pre-gait activities: sidesteps towards [facility] ?  ? ?Stairs ?  ?  ?  ?  ?  ? ?Wheelchair Mobility ?  ? ?Modified Rankin (Stroke Patients Only) ?  ? ?  ? ?Balance Overall balance assessment: Mild deficits observed, not formally tested ?  ?  ?  ?  ?  ?  ?  ?  ?  ?  ?  ?  ?  ?  ?  ?  ?  ?  ?   ? ? ? ?Pertinent Vitals/Pain Pain Assessment ?Pain Assessment: Faces ?Faces Pain Scale: Hurts little more ?Pain Location: abdomen ?Pain Descriptors / Indicators: Discomfort ?Pain Intervention(s): Monitored during session, Repositioned  ? ? ?Home Living Family/patient expects to be discharged to:: Private residence ?Living Arrangements: Alone ?Available Help at Discharge: Family ?Type of Home: House ?Home Access: Stairs to enter ?  ?Entrance Stairs-Number of Steps: 1 ?Alternate Level Stairs-Number of Steps: 2 steps to get into dining room from living room ?Home Layout: Multi-level ?Home Equipment: Conservation officer, nature (2 wheels);Cane - single point ?   ?  ?Prior Function Prior Level of Function : Independent/Modified Independent ?  ?  ?  ?  ?  ?  ?  ?  ?  ? ? ?  Hand Dominance  ?   ? ?  ?Extremity/Trunk Assessment  ? Upper Extremity Assessment ?Upper Extremity Assessment: Generalized weakness ?  ? ?Lower Extremity Assessment ?Lower Extremity Assessment: Generalized weakness ?  ? ?Cervical / Trunk Assessment ?Cervical / Trunk Assessment: Kyphotic  ?Communication  ? Communication: No difficulties  ?Cognition  Arousal/Alertness: Awake/alert ?Behavior During Therapy: [facility] for tasks assessed/performed ?Overall Cognitive Status: Within Functional Limits for tasks assessed ?  ?  ?  ?  ?  ?  ?  ?  ?  ?  ?  ?  ?  ?  ?  ?  ?  ?  ?  ? ?  ?General Comments   ? ?  ?Exercises    ? ?Assessment/Plan  ?  ?PT Assessment Patient needs continued PT services  ?PT Problem List Decreased strength;Decreased activity tolerance;Decreased balance;Decreased mobility ? ?   ?  ?PT Treatment Interventions DME instruction;Gait training;Stair training;Functional mobility training;Therapeutic activities;Therapeutic exercise;Balance training;Patient/family education   ? ?PT Goals (Current goals can be found in the Care Plan section)  ?Acute Rehab PT Goals ?Patient Stated Goal: to go home ?PT Goal Formulation: With patient ?Time For Goal Achievement: 03/02/22 ?Potential to Achieve Goals: Good ? ?  ?Frequency Min 3X/week ?  ? ? ?Co-evaluation   ?  ?  ?  ?  ? ? ?  ?AM-PAC PT "6 Clicks" Mobility  ?Outcome Measure Help needed turning from your back to your side while in a flat bed without using bedrails?: A Little ?Help needed moving from lying on your back to sitting on the side of a flat bed without using bedrails?: A Little ?Help needed moving to and from a bed to a chair (including a wheelchair)?: A Little ?Help needed standing up from a chair using your arms (e.g., wheelchair or bedside chair)?: A Little ?Help needed to walk in hospital room?: A Little ?Help needed climbing 3-5 steps with a railing? : A Lot ?6 Click Score: 17 ? ?  ?End of Session Equipment Utilized During Treatment: Gait belt ?Activity Tolerance: Patient limited by pain ?Patient left: in bed;with bed alarm set;with call bell/phone within reach;with nursing/sitter in room ?Nurse Communication: Mobility status ?PT Visit Diagnosis: Unsteadiness on feet (R26.81);Muscle weakness (generalized) (M62.81);History of falling (Z91.81) ?  ? ?Time: 1735-6701 ?PT Time Calculation (min) (ACUTE ONLY):  27 min ? ? ?Charges:   PT Evaluation ?$PT Eval Moderate Complexity: 1 Mod ?PT Treatments ?$Therapeutic Activity: 8-22 mins ?  ?   ? ? ?Brityn Mastrogiovanni A. Gilford Rile, PT, DPT ?Acute Rehabilitation Services ?Pager [phone] ?Office [phone] ? ? ?Malaya Cagley A Nazarene Bunning ?02/16/2022, 4:57 PM ? ?

## 2022-02-16 NOTE — Progress Notes (Addendum)
?PROGRESS NOTE ? ? ? ?[name] [name]  TKP:[id number] DOB: [birth year] DOA: 02/14/2022 ?PCP: Virginia Crews, MD  ? ? ?Brief Narrative:  ?71 year old female with history of type 2 diabetes, hypertension, obesity status post liposuction and panniculectomy on 3/6 presented from home with fall, dizziness and abdominal wall discomfort. ?Liposuction and panniculectomy 3/6, seen and followed by plastic surgery, recently seen in the office and started on Keflex.  She has 2 JP drains, apparently disconnected while falling.  In the emergency room, presentation blood pressure 89/52, sodium 125, potassium 3.  Started on IV fluid resuscitation, started on antibiotics and admitted to the hospital. ? ? ?Assessment & Plan:  ?Hypovolemic hyponatremia. ?Moderate dehydration with ongoing fluid loss in the context of ongoing use of blood pressure medications. ?Adequately resuscitated.  ?Able to hydrate orally.  Discontinue IV fluids today.  Renal functions are adequate.  ?Sodium 134.  ? ?Postoperative seroma/postop infection: ?CT scan consistent with postop findings, subcutaneous air but no drainable fluid collection.  WBC count is normal.  She was on Keflex as outpatient.  ?Blood cultures negative so far.  ?Culture from JP drain with mixed organism, [place].  MRSA swab negative.  ?Repeat CT scan 3/17, stable findings.  No drainable collection.  ?With clinical improvement, will discontinue vancomycin.  Continue cefepime until culture sensitivity. ?We will treat for both gram-positive and gram-negative. ?Followed by plastic surgery.  Anticipating conservative management. ? ?Nausea/vomiting, severe abdominal pain:  ?Improved.  Tolerating regular diet with bowel regimen.   ? ?Essential hypertension: Holding antihypertensives. ? ?Hypokalemia/hypomagnesemia/hypophosphatemia: Replaced.  Replaced and adequate. ? 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  Continue. ? ?Type 2 diabetes: On metformin.  Well-controlled at home.   Holding metformin.  on sliding scale insulin. ? ?Anemia associated with acute blood loss:  ?Preop hemoglobin 11, presentation hemoglobin 9-7.8-8. ?Currently no indication for blood transfusion. ? ? ?DVT prophylaxis: Place and maintain sequential compression device Start: 02/16/22 0222 ?SCDs Start: 02/15/22 0338 ? ? ?Code Status: Full code ?Family Communication: None.  Patient is talking to her sister. ?Disposition Plan: Status is: Inpatient. ? ?Remains as inpatient.  Severe abdominal pain.  IV antibiotics. ?  ? ? ?Consultants:  ?Plastic surgery. ? ?Procedures:  ?None ? ?Antimicrobials:  ?Rocephin 3/15-3/16 ?Vancomycin and cefepime 3/16--- 3/18 ?Cefepime 3/18--- ? ? ?Subjective: ? ?Patient seen and examined.  Walking back from bathroom with the help of nurse tech.  No overnight events. ?No more episodes of nausea or vomiting.  She was able to tolerate full liquid diet, will advance to regular diabetic diet. ?She had 2 small bowel movement.  Does have some diffuse discomfort, however does not need any pain medications. ? ?Objective: ?Vitals:  ? 02/16/22 1624 02/16/22 2235 02/17/22 0448 02/17/22 0834  ?BP: 117/66 104/60 (!) 125/55 (!) 106/53  ?Pulse: 86 89 78 80  ?Resp: '18 18 18 16  '$ ?Temp: 99 ?F (37.2 ?C) (!) 97.5 ?F (36.4 ?C) 98 ?F (36.7 ?C) 98.2 ?F (36.8 ?C)  ?TempSrc: Oral Oral  Oral  ?SpO2: 98% 98% 96% 99%  ?Weight:      ?Height:      ? ? ?Intake/Output Summary (Last 24 hours) at 02/17/2022 1203 ?Last data filed at 02/17/2022 0900 ?Gross per 24 hour  ?Intake 3395.3 ml  ?Output 1400 ml  ?Net 1995.3 ml  ? ?Filed Weights  ? 02/14/22 2226 02/15/22 1500  ?Weight: 99.8 kg 102.6 kg  ? ? ?Examination: ? ?General: Looks fairly comfortable today.  Was able to walk with support. ?Cardiovascular:  S1-S2 normal.  Regular rate rhythm. ?Respiratory: Bilateral clear. ?Gastrointestinal: Soft.  Mild tenderness along the incision line.  Right flank open drainage, dressing soaked.  Left flank JP drain with thin purulent material present.   Incisions are intact. ?Ext: No deformities.  No cyanosis or edema. ?Neuro: No deficits. ? ? ? ? ?Data Reviewed: I have personally reviewed following labs and imaging studies ? ?CBC: ?Recent Labs  ?Lab 02/14/22 ?2334 02/15/22 ?1610 02/15/22 ?1551 02/16/22 ?0247 02/17/22 ?0705  ?WBC 5.3 5.2 5.7 6.5 6.1  ?NEUTROABS 3.2 3.1 3.4 5.4 3.8  ?HGB 9.0* 7.8* 8.6* 8.8* 8.3*  ?HCT 28.1* 24.9* 26.3* 27.6* 26.4*  ?MCV 82.2 82.7 81.7 81.2 84.1  ?PLT 411* 347 371 400 385  ? ?Basic Metabolic Panel: ?Recent Labs  ?Lab 02/14/22 ?2334 02/15/22 ?9604 02/15/22 ?1551 02/16/22 ?0247 02/17/22 ?0705  ?NA 125* 128* 132* 131* 134*  ?K 3.0* 3.0* 3.1* 3.1* 3.8  ?CL 89* 92* 97* 95* 102  ?CO2 '27 24 25 23 23  '$ ?GLUCOSE 227* 251* 189* 304* 231*  ?BUN 6* <5* <5* 6* 6*  ?CREATININE 0.68 0.58 0.68 0.58 0.58  ?CALCIUM 8.3* 7.7* 7.7* 8.0* 7.7*  ?MG  --  1.7  --  2.1 2.0  ?PHOS  --  1.5*  --  2.9 2.2*  ? ?GFR: ?Estimated Creatinine Clearance: 84.4 mL/min (by C-G formula based on SCr of 0.58 mg/dL). ?Liver Function Tests: ?Recent Labs  ?Lab 02/14/22 ?2334  ?AST 11*  ?ALT 14  ?ALKPHOS 94  ?BILITOT 0.3  ?PROT 6.1*  ?ALBUMIN 1.6*  ? ?No results for input(s): LIPASE, AMYLASE in the last 168 hours. ?No results for input(s): AMMONIA in the last 168 hours. ?Coagulation Profile: ?Recent Labs  ?Lab 02/14/22 ?2334  ?INR 1.2  ? ?Cardiac Enzymes: ?No results for input(s): CKTOTAL, CKMB, CKMBINDEX, TROPONINI in the last 168 hours. ?BNP (last 3 results) ?No results for input(s): PROBNP in the last 8760 hours. ?HbA1C: ?No results for input(s): HGBA1C in the last 72 hours. ?CBG: ?Recent Labs  ?Lab 02/15/22 ?2002 02/16/22 ?0715 02/16/22 ?1129 02/16/22 ?1622 02/17/22 ?5409  ?GLUCAP 152* 255* 178* 104* 220*  ? ?Lipid Profile: ?No results for input(s): CHOL, HDL, LDLCALC, TRIG, CHOLHDL, LDLDIRECT in the last 72 hours. ?Thyroid Function Tests: ?No results for input(s): TSH, T4TOTAL, FREET4, T3FREE, THYROIDAB in the last 72 hours. ?Anemia Panel: ?No results for input(s):  VITAMINB12, FOLATE, FERRITIN, TIBC, IRON, RETICCTPCT in the last 72 hours. ?Sepsis Labs: ?Recent Labs  ?Lab 02/14/22 ?2334 02/15/22 ?0615  ?LATICACIDVEN 1.4 1.4  ? ? ?Recent Results (from the past 240 hour(s))  ?Culture, blood (Routine x 2)     Status: None (Preliminary result)  ? Collection Time: 02/14/22 11:34 PM  ? Specimen: BLOOD  ?Result Value Ref Range Status  ? Specimen Description BLOOD LEFT ANTECUBITAL  Final  ? Special Requests   Final  ?  BOTTLES DRAWN AEROBIC AND ANAEROBIC Blood Culture adequate volume  ? Culture   Final  ?  NO GROWTH 2 DAYS ?Performed at [facility], [place] [address], [place], [place] [address] ?  ? Report Status PENDING  Incomplete  ?Culture, blood (Routine x 2)     Status: None (Preliminary result)  ? Collection Time: 02/14/22 11:34 PM  ? Specimen: BLOOD  ?Result Value Ref Range Status  ? Specimen Description BLOOD RIGHT ANTECUBITAL  Final  ? Special Requests   Final  ?  BOTTLES DRAWN AEROBIC AND ANAEROBIC Blood Culture adequate volume  ? Culture   Final  ?  NO GROWTH 2 DAYS ?Performed at [facility], [place] [address] [place], [place] [address] ?  ? Report Status PENDING  Incomplete  ?MRSA Next Gen by PCR, Nasal     Status: None  ? Collection Time: 02/15/22  6:57 AM  ? Specimen: [place]; Nasal Swab  ?Result Value Ref Range Status  ? MRSA by PCR Next Gen NOT DETECTED NOT DETECTED Final  ?  Comment: (NOTE) ?The GeneXpert MRSA Assay (FDA approved for NASAL specimens only), ?is one component of a comprehensive MRSA colonization surveillance ?program. It is not intended to diagnose MRSA infection nor to guide ?or monitor treatment for MRSA infections. ?Test performance is not FDA approved in patients less than 2 years ?old. ?Performed at [facility], [place] [address] [place], [place] ?[address] ?  ?Aerobic Culture w Gram Stain (superficial specimen)     Status: None (Preliminary result)  ? Collection Time: 02/15/22  6:57 AM  ? Specimen: Wound  ?Result Value Ref Range  Status  ? Specimen Description WOUND  Final  ? Special Requests NONE  Final  ? Gram Stain   Final  ?  RARE WBC PRESENT, PREDOMINANTLY PMN ?RARE GRAM POSITIVE COCCI IN PAIRS ?  ? Culture   Final  ?  FEW Mariea Clonts

## 2022-02-16 NOTE — Progress Notes (Addendum)
Overnight event ? ?Notified by RN that patient is having coffee-ground emesis, abdominal pain and abdomen appears increasingly more distended.  ? ?Chart reviewed, patient with history of obesity status post liposuction and panniculectomy on 3/6 admitted for abdominal wall discomfort after a fall.  She had 2 JP drains which disconnected while falling.  CT done yesterday showing postoperative findings, subcutaneous air but no drainable fluid collection.  She was seen by plastic surgery and left drain was connected to a bulb.  Plastic surgery spoke to IR and recommended holding off on placing another drain as there is no seroma.  She is on vancomycin and cefepime.  No prior EGD results in the chart. ? ?Patient seen and examined at bedside. Vitals signs stable: Temp 98.3 F, pulse 96, BP 135/61, RR 22, Spo2 94% RA. Complaining of 5 out of 10 intensity generalized abdominal pain.  She thinks her abdomen is twice the size it was earlier.  Also reports heartburn and multiple episodes of vomiting.  On exam, abdomen appears distended with generalized tenderness to palpation.  Bowel sounds present.  Patient is moaning when her abdomen is touched. ? ?-Keep n.p.o. ?-Stat CT abdomen pelvis ?-Hold Lovenox ?-Stat CBC ?-Stat Gastroccult test, if positive, start IV PPI. ?-EKG showing QT prolongation, avoid QT prolonging antiemetics. ?-IM Tigan 200 mg x 1, scopolamine patch ?-General surgery consulted (Dr. Rosendo Gros) ? ?Addendum/update: ?-CT abdomen pelvis showing: ?"IMPRESSION: ?1. No significant interval change since the prior CT. Air in the ?soft tissues of the anterior pelvic wall as seen on the prior CT. No ?drainable fluid collection. ?2. No bowel obstruction. Normal appendix. ?3. Cholelithiasis. ?4. Nonobstructing bilateral renal calculi. No hydronephrosis. ?5. Trace left pleural effusion. ?6. Aortic Atherosclerosis (ICD10-I70.0)." ? ?-No leukocytosis on labs.  Hemoglobin 8.8, stable. ?-Morphine as needed for pain ?-Potassium  3.1, supplement ordered ? ?

## 2022-02-16 NOTE — Progress Notes (Signed)
At start of shift, patient complained of nausea and heartburn. Had two episodes of emesis. RN notified on call MD, [place], and order for EKG and GI cocktail placed. EKG obtained and order for scopolamine patch placed by MD. Patient with minimal relief to nausea, vomiting and heartburn. Patient continued have multiple episodes of vomit, now coffee ground emesis with complaint of abdominal pain (4/10). RN noticed abdomen to be more distended then at beginning of shift. RN notified Marlowe Sax, MD.  ? ?Vitals obtained. MD at bedside to assess patient.  ? ?Order for STAT CT of abdomen, gastroccult test and CBC placed. RN will continue to monitor patient.  ?

## 2022-02-16 NOTE — Progress Notes (Signed)
S/p abdominal panniculectomy 02/05/22.  Patients right drain is out.  She notes significant drainage from the right side. ? ?PE ?Temp:  [98.3 ?F (36.8 ?C)-100.1 ?F (37.8 ?C)] 98.4 ?F (36.9 ?C) (03/17 0910) ?Pulse Rate:  [77-100] 77 (03/17 0910) ?Resp:  [17-20] 17 (03/17 0910) ?BP: (111-158)/(53-77) 136/72 (03/17 0910) ?SpO2:  [94 %-99 %] 99 % (03/17 0910) ?Weight:  [102.6 kg] 102.6 kg (03/16 1500)  ? ?Gen:  alert, NAD ?Abdomen:  left drain intact.  Incision intact, no significant erythema.  No significant seroma or hematoma noted on physical exam. ? ?A/P ?Status post panniculetomy, drain out on the right ?At least some fluid is draining out of the incision on the right but would recommend imaging again in a several days to make sure she is not accumulating fluid under her skin flap. ?

## 2022-02-16 NOTE — Progress Notes (Signed)
Discussed with Dr. Marlowe Sax ?Pt's CT with no acute or new findings. ?Stool in colon ?Hypokalemia ?Would rec bowel regimen and suppl K. ?No need for surgery  ?Please call back if needed. ? ? ?

## 2022-02-16 NOTE — Progress Notes (Addendum)
With an order to keep abdominal binder in place from Dr. Erin Hearing.  Attempted to place abdominal binder, patient expressed discomfort as binder rolls up especially on the back side due to body habitus.  Patient refused the binder at this time.  Dr. Erin Hearing made aware. ?

## 2022-02-17 LAB — BASIC METABOLIC PANEL
Anion gap: 9 (ref 5–15)
BUN: 6 mg/dL — ABNORMAL LOW (ref 8–23)
CO2: 23 mmol/L (ref 22–32)
Calcium: 7.7 mg/dL — ABNORMAL LOW (ref 8.9–10.3)
Chloride: 102 mmol/L (ref 98–111)
Creatinine, Ser: 0.58 mg/dL (ref 0.44–1.00)
GFR, Estimated: >60 mL/min (ref >60)
Glucose, Bld: 231 mg/dL — ABNORMAL HIGH (ref 70–99)
Potassium: 3.8 mmol/L (ref 3.5–5.1)
Sodium: 134 mmol/L — ABNORMAL LOW (ref 135–145)

## 2022-02-17 LAB — CBC WITH DIFFERENTIAL/PLATELET
Abs Immature Granulocytes: 0 10*3/uL (ref 0.00–0.07)
Band Neutrophils: 10 %
Basophils Absolute: 0 10*3/uL (ref 0.0–0.1)
Basophils Relative: 0 %
Eosinophils Absolute: 0.2 10*3/uL (ref 0.0–0.5)
Eosinophils Relative: 3 %
HCT: 26.4 % — ABNORMAL LOW (ref 36.0–46.0)
Hemoglobin: 8.3 g/dL — ABNORMAL LOW (ref 12.0–15.0)
Lymphocytes Relative: 21 %
Lymphs Abs: 1.3 10*3/uL (ref 0.7–4.0)
MCH: 26.4 pg (ref 26.0–34.0)
MCHC: 31.4 g/dL (ref 30.0–36.0)
MCV: 84.1 fL (ref 80.0–100.0)
Monocytes Absolute: 0.9 10*3/uL (ref 0.1–1.0)
Monocytes Relative: 14 %
Neutro Abs: 3.8 10*3/uL (ref 1.7–7.7)
Neutrophils Relative %: 52 %
Platelets: 385 10*3/uL (ref 150–400)
RBC: 3.14 MIL/uL — ABNORMAL LOW (ref 3.87–5.11)
RDW: 15.4 % (ref 11.5–15.5)
WBC: 6.1 10*3/uL (ref 4.0–10.5)
nRBC: 0.5 % — ABNORMAL HIGH (ref 0.0–0.2)
nRBC: 1 /100 WBC — ABNORMAL HIGH

## 2022-02-17 LAB — AEROBIC CULTURE W GRAM STAIN (SUPERFICIAL SPECIMEN)

## 2022-02-17 LAB — GLUCOSE, CAPILLARY
Glucose-Capillary: 220 mg/dL — ABNORMAL HIGH (ref 70–99)
Glucose-Capillary: 147 mg/dL — ABNORMAL HIGH (ref 70–99)
Glucose-Capillary: 137 mg/dL — ABNORMAL HIGH (ref 70–99)

## 2022-02-17 LAB — PHOSPHORUS: Phosphorus: 2.2 mg/dL — ABNORMAL LOW (ref 2.5–4.6)

## 2022-02-17 LAB — MAGNESIUM: Magnesium: 2 mg/dL (ref 1.7–2.4)

## 2022-02-18 DIAGNOSIS — D62 Acute posthemorrhagic anemia: Secondary | ICD-10-CM | POA: Diagnosis not present

## 2022-02-18 DIAGNOSIS — T8140XA Infection following a procedure, unspecified, initial encounter: Secondary | ICD-10-CM | POA: Diagnosis not present

## 2022-02-18 DIAGNOSIS — E871 Hypo-osmolality and hyponatremia: Secondary | ICD-10-CM | POA: Diagnosis not present

## 2022-02-18 DIAGNOSIS — E876 Hypokalemia: Secondary | ICD-10-CM | POA: Diagnosis not present

## 2022-02-18 LAB — CBC WITH DIFFERENTIAL/PLATELET
Abs Immature Granulocytes: 0.2 10*3/uL — ABNORMAL HIGH (ref 0.00–0.07)
Basophils Absolute: 0.1 10*3/uL (ref 0.0–0.1)
Basophils Relative: 1 %
Eosinophils Absolute: 0.3 10*3/uL (ref 0.0–0.5)
Eosinophils Relative: 4 %
HCT: 26 % — ABNORMAL LOW (ref 36.0–46.0)
Hemoglobin: 8 g/dL — ABNORMAL LOW (ref 12.0–15.0)
Lymphocytes Relative: 25 %
Lymphs Abs: 1.8 10*3/uL (ref 0.7–4.0)
MCH: 26 pg (ref 26.0–34.0)
MCHC: 30.8 g/dL (ref 30.0–36.0)
MCV: 84.4 fL (ref 80.0–100.0)
Metamyelocytes Relative: 1 %
Monocytes Absolute: 0.7 10*3/uL (ref 0.1–1.0)
Monocytes Relative: 10 %
Myelocytes: 2 %
Neutro Abs: 4 10*3/uL (ref 1.7–7.7)
Neutrophils Relative %: 57 %
Platelets: 391 10*3/uL (ref 150–400)
RBC: 3.08 MIL/uL — ABNORMAL LOW (ref 3.87–5.11)
RDW: 15.6 % — ABNORMAL HIGH (ref 11.5–15.5)
WBC: 7 10*3/uL (ref 4.0–10.5)
nRBC: 0.3 % — ABNORMAL HIGH (ref 0.0–0.2)
nRBC: 2 /100 WBC — ABNORMAL HIGH

## 2022-02-18 LAB — GLUCOSE, CAPILLARY
Glucose-Capillary: 173 mg/dL — ABNORMAL HIGH (ref 70–99)
Glucose-Capillary: 163 mg/dL — ABNORMAL HIGH (ref 70–99)
Glucose-Capillary: 98 mg/dL (ref 70–99)
Glucose-Capillary: 157 mg/dL — ABNORMAL HIGH (ref 70–99)

## 2022-02-18 LAB — BASIC METABOLIC PANEL
Anion gap: 7 (ref 5–15)
BUN: 6 mg/dL — ABNORMAL LOW (ref 8–23)
CO2: 26 mmol/L (ref 22–32)
Calcium: 7.8 mg/dL — ABNORMAL LOW (ref 8.9–10.3)
Chloride: 102 mmol/L (ref 98–111)
Creatinine, Ser: 0.66 mg/dL (ref 0.44–1.00)
GFR, Estimated: >60 mL/min (ref >60)
Glucose, Bld: 178 mg/dL — ABNORMAL HIGH (ref 70–99)
Potassium: 3.9 mmol/L (ref 3.5–5.1)
Sodium: 135 mmol/L (ref 135–145)

## 2022-02-18 MED ORDER — FAMOTIDINE 20 MG PO TABS
20.0000 mg | ORAL_TABLET | Freq: Two times a day (BID) | ORAL | Status: DC
  Administered 2022-02-18: 20 mg via ORAL
  Filled 2022-02-18: qty 1

## 2022-02-18 MED ORDER — LIDOCAINE VISCOUS HCL 2 % MT SOLN
15.0000 mL | OROMUCOSAL | Status: DC
  Administered 2022-02-18 – 2022-02-19 (×5): 15 mL via OROMUCOSAL
  Filled 2022-02-18 (×33): qty 15

## 2022-02-18 MED ORDER — ALUM & MAG HYDROXIDE-SIMETH 200-200-20 MG/5ML PO SUSP
30.0000 mL | Freq: Four times a day (QID) | ORAL | Status: DC | PRN
  Administered 2022-02-18: 30 mL via ORAL
  Filled 2022-02-18: qty 30

## 2022-02-18 MED ORDER — FAMOTIDINE IN NACL 20-0.9 MG/50ML-% IV SOLN
20.0000 mg | Freq: Two times a day (BID) | INTRAVENOUS | Status: DC
  Administered 2022-02-18 – 2022-02-19 (×3): 20 mg via INTRAVENOUS
  Filled 2022-02-18 (×4): qty 50

## 2022-02-18 MED ORDER — ALUM & MAG HYDROXIDE-SIMETH 200-200-20 MG/5ML PO SUSP
30.0000 mL | Freq: Once | ORAL | Status: AC | PRN
  Administered 2022-02-18: 30 mL via ORAL
  Filled 2022-02-18: qty 30

## 2022-02-18 NOTE — Progress Notes (Addendum)
?   02/17/22 2223  ?Pain Assessment  ?Pain Scale 0-10  ?Pain Score 8  ?Pain Type Acute pain  ?Pain Location Other (Comment) ?(Mid Epigastric Region)  ?Pain Descriptors / Indicators Burning  ?Pain Frequency Other (Comment) ?(Occurs when eating/drinking anything)  ?Pain Onset On-going  ?Patients Stated Pain Goal 0  ?Pain Intervention(s) MD notified (Comment);Emotional support  ?Provider Notification  ?Provider Name/Title Dr. Marlowe Sax  ?Date Provider Notified 02/17/22  ?Time Provider Notified 2223  ?Notification Type Page  ?Notification Reason Requested by patient/family ?(c/o severe mid sternal burning when eating/drinking)  ?Provider response See new orders  ?Date of Provider Response 02/18/22  ? ?Patient is in tears.  She is attempting to eat her HS snack and is complaining of severe mid epigastric burning.  She states she has had this pain whenever she eats or drinks something.  The pain has been occurring all day and is getting worse.  She is not choking or having difficulty swallowing.  Dr. Marlowe Sax made aware.  Order received for GI Cocktail and given to patient.  Will continue to monitor patient.  Earleen Reaper RN ? ?Per patient this morning, the GI Cocktail did not help with the mid epigastric burning.  Will report to day shift RN and continue to monitor patient.  Earleen Reaper RN ?

## 2022-02-18 NOTE — Progress Notes (Signed)
Patient ID: [name] [name], female   DOB: 10/16/1951, 71 y.o.   MRN: [id number] ? ?Patient pulled left JP drain out while in the bathroom. Site draining brown thick fluid. MD notified. States surgery will see her tomorrow. Keep dressings changed. ? ?Haydee Salter, RN ? ?

## 2022-02-18 NOTE — Progress Notes (Signed)
?PROGRESS NOTE ? ? ? ?[name] [name]  GLO:[id number] DOB: 04-21-1951 DOA: 02/14/2022 ?PCP: Virginia Crews, MD  ? ? ?Brief Narrative:  ?71 year old female with history of type 2 diabetes, hypertension, obesity status post liposuction and panniculectomy on 3/6 presented from home with fall, dizziness and abdominal wall discomfort. ?Liposuction and panniculectomy 3/6, seen and followed by plastic surgery, recently seen in the office and started on Keflex.  She has 2 JP drains, apparently disconnected while falling.  In the emergency room, presentation blood pressure 89/52, sodium 125, potassium 3.  Started on IV fluid resuscitation, started on antibiotics and admitted to the hospital. ? ? ?Assessment & Plan:  ?Hypovolemic hyponatremia. ?Moderate dehydration with ongoing fluid loss in the context of ongoing use of blood pressure medications. ?Adequately resuscitated.  ?Able to hydrate orally.   Renal functions are adequate.  Sodium normalized.  ? ?Postoperative seroma/postop infection: ?CT scan consistent with postop findings, subcutaneous air but no drainable fluid collection.  WBC count is normal.  She was on Keflex as outpatient.  ?Blood cultures negative so far.  ?Culture from JP drain with mixed organism, Eiknela.  MRSA swab negative.  ?Repeat CT scan 3/17, stable findings.  No drainable collection.  ?With clinical improvement, vancomycin discontinued.  Keeping on cefepime.  ?Followed by plastic surgery.  Anticipating conservative management. ? ?Nausea vomiting, improved.  She has significant odynophagia and midsternal pain on swallowing.  We will try some morphine, lidocaine viscous.  Pepcid IV twice daily.  No further vomiting or evidence of any hematemesis. ? ?Essential hypertension: Holding antihypertensives. ? ?Hypokalemia/hypomagnesemia/hypophosphatemia: Replaced.  Replaced and adequate. ? 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  Continue. ? ?Type 2 diabetes: On metformin.   Well-controlled at home.  Holding metformin.  on sliding scale insulin. ? ?Anemia associated with acute blood loss:  ?Preop hemoglobin 11, presentation hemoglobin 9-7.8-8-8. ?Currently no indication for blood transfusion. ? ? ?DVT prophylaxis: Place and maintain sequential compression device Start: 02/16/22 0222 ?SCDs Start: 02/15/22 0338 ? ? ?Code Status: Full code ?Family Communication: None.  Patient is talking to her sister. ?Disposition Plan: Status is: Inpatient. ? ?Remains as inpatient.  Severe abdominal pain.  IV antibiotics. ?  ? ? ?Consultants:  ?Plastic surgery. ? ?Procedures:  ?None ? ?Antimicrobials:  ?Rocephin 3/15-3/16 ?Vancomycin and cefepime 3/16--- 3/18 ?Cefepime 3/18--- ? ? ?Subjective: ? ?Patient seen and examined.  Denies any nausea vomiting.  Lower abdomen pain is mostly improved.  Still having bowel movement. ?Since episode of vomiting the night before, she had some difficulty swallowing pills and feels like she gets severe spasm on any attempt to eat or swallow.  No nausea or vomiting.  No spontaneous hematemesis or hemoptysis. ?Not much relief with Maalox overnight.  We will try some lidocaine viscous and she can use Pepcid IV.  Not likely food is stuck in the esophagus. ? ?Objective: ?Vitals:  ? 02/17/22 1719 02/17/22 2059 02/18/22 1884 02/18/22 0937  ?BP: (!) 107/47 (!) 127/56 (!) 125/59 (!) 122/53  ?Pulse: 93 85 76 80  ?Resp: '16 18 18 16  '$ ?Temp: 97.7 ?F (36.5 ?C) 98.5 ?F (36.9 ?C) 98.4 ?F (36.9 ?C) 98.9 ?F (37.2 ?C)  ?TempSrc: Oral Oral Oral Oral  ?SpO2: 97% 95% 97% 99%  ?Weight:  112.4 kg    ?Height:  '5\' 7"'$  (1.702 m)    ? ? ?Intake/Output Summary (Last 24 hours) at 02/18/2022 1133 ?Last data filed at 02/18/2022 0900 ?Gross per 24 hour  ?Intake 660 ml  ?Output 452 ml  ?  Net 208 ml  ? ?Filed Weights  ? 02/14/22 2226 02/15/22 1500 02/17/22 2059  ?Weight: 99.8 kg 102.6 kg 112.4 kg  ? ? ?Examination: ? ?General: Mildly anxious due to midsternal pain and inability to eat. ?Cardiovascular: S1-S2  normal.  Regular rate rhythm. ?Respiratory: Bilateral clear. ?Gastrointestinal: Soft.  Mild tenderness along the incision line.  Right flank open drainage, dressing soaked.  Left flank JP drain with thin purulent material present.  Incisions are intact. ?Ext: No deformities.  No cyanosis or edema. ?Neuro: No deficits. ? ? ? ? ?Data Reviewed: I have personally reviewed following labs and imaging studies ? ?CBC: ?Recent Labs  ?Lab 02/15/22 ?0916 02/15/22 ?1551 02/16/22 ?0247 02/17/22 ?0705 02/18/22 ?0032  ?WBC 5.2 5.7 6.5 6.1 7.0  ?NEUTROABS 3.1 3.4 5.4 3.8 4.0  ?HGB 7.8* 8.6* 8.8* 8.3* 8.0*  ?HCT 24.9* 26.3* 27.6* 26.4* 26.0*  ?MCV 82.7 81.7 81.2 84.1 84.4  ?PLT 347 371 400 385 391  ? ?Basic Metabolic Panel: ?Recent Labs  ?Lab 02/15/22 ?0916 02/15/22 ?1551 02/16/22 ?0247 02/17/22 ?0705 02/18/22 ?0032  ?NA 128* 132* 131* 134* 135  ?K 3.0* 3.1* 3.1* 3.8 3.9  ?CL 92* 97* 95* 102 102  ?CO2 '24 25 23 23 26  '$ ?GLUCOSE 251* 189* 304* 231* 178*  ?BUN <5* <5* 6* 6* 6*  ?CREATININE 0.58 0.68 0.58 0.58 0.66  ?CALCIUM 7.7* 7.7* 8.0* 7.7* 7.8*  ?MG 1.7  --  2.1 2.0  --   ?PHOS 1.5*  --  2.9 2.2*  --   ? ?GFR: ?Estimated Creatinine Clearance: 87 mL/min (by C-G formula based on SCr of 0.66 mg/dL). ?Liver Function Tests: ?Recent Labs  ?Lab 02/14/22 ?2334  ?AST 11*  ?ALT 14  ?ALKPHOS 94  ?BILITOT 0.3  ?PROT 6.1*  ?ALBUMIN 1.6*  ? ?No results for input(s): LIPASE, AMYLASE in the last 168 hours. ?No results for input(s): AMMONIA in the last 168 hours. ?Coagulation Profile: ?Recent Labs  ?Lab 02/14/22 ?2334  ?INR 1.2  ? ?Cardiac Enzymes: ?No results for input(s): CKTOTAL, CKMB, CKMBINDEX, TROPONINI in the last 168 hours. ?BNP (last 3 results) ?No results for input(s): PROBNP in the last 8760 hours. ?HbA1C: ?No results for input(s): HGBA1C in the last 72 hours. ?CBG: ?Recent Labs  ?Lab 02/17/22 ?0727 02/17/22 ?1621 02/17/22 ?2130 02/18/22 ?7106 02/18/22 ?1119  ?GLUCAP 220* 147* 137* 173* 163*  ? ?Lipid Profile: ?No results for input(s):  CHOL, HDL, LDLCALC, TRIG, CHOLHDL, LDLDIRECT in the last 72 hours. ?Thyroid Function Tests: ?No results for input(s): TSH, T4TOTAL, FREET4, T3FREE, THYROIDAB in the last 72 hours. ?Anemia Panel: ?No results for input(s): VITAMINB12, FOLATE, FERRITIN, TIBC, IRON, RETICCTPCT in the last 72 hours. ?Sepsis Labs: ?Recent Labs  ?Lab 02/14/22 ?2334 02/15/22 ?0615  ?LATICACIDVEN 1.4 1.4  ? ? ?Recent Results (from the past 240 hour(s))  ?Culture, blood (Routine x 2)     Status: None (Preliminary result)  ? Collection Time: 02/14/22 11:34 PM  ? Specimen: BLOOD  ?Result Value Ref Range Status  ? Specimen Description BLOOD LEFT ANTECUBITAL  Final  ? Special Requests   Final  ?  BOTTLES DRAWN AEROBIC AND ANAEROBIC Blood Culture adequate volume  ? Culture   Final  ?  NO GROWTH 3 DAYS ?Performed at [facility], [place] [address], [place], [place] [address] ?  ? Report Status PENDING  Incomplete  ?Culture, blood (Routine x 2)     Status: None (Preliminary result)  ? Collection Time: 02/14/22 11:34 PM  ? Specimen: BLOOD  ?  Result Value Ref Range Status  ? Specimen Description BLOOD RIGHT ANTECUBITAL  Final  ? Special Requests   Final  ?  BOTTLES DRAWN AEROBIC AND ANAEROBIC Blood Culture adequate volume  ? Culture   Final  ?  NO GROWTH 3 DAYS ?Performed at [facility], [place] [address], [place], [place] [address] ?  ? Report Status PENDING  Incomplete  ?MRSA Next Gen by PCR, Nasal     Status: None  ? Collection Time: 02/15/22  6:57 AM  ? Specimen: Panniculus; Nasal Swab  ?Result Value Ref Range Status  ? MRSA by PCR Next Gen NOT DETECTED NOT DETECTED Final  ?  Comment: (NOTE) ?The GeneXpert MRSA Assay (FDA approved for NASAL specimens only), ?is one component of a comprehensive MRSA colonization surveillance ?program. It is not intended to diagnose MRSA infection nor to guide ?or monitor treatment for MRSA infections. ?Test performance is not FDA approved in patients less than 2 years ?old. ?Performed at [facility], [place] [address] [place], [place] ?[address] ?  ?Aerobic Culture w Gram Stain (superficial specimen)     Status: None  ? Collection Time: 02/15/22  6:57 AM  ? Specimen: Wound  ?Result Value Ref Range Status  ? Specimen

## 2022-02-18 NOTE — Progress Notes (Signed)
S/p abdominal panniculectomy on 02/05/2022.  Patient's right drain is out.  She continues to note significant drainage from the right side.  She had epigastric pain overnight. ? ?Physical exam ?BP (!) 122/53   Pulse 80   Temp 98.9 ?F (37.2 ?C) (Oral)   Resp 16   Ht '5\' 7"'$  (1.702 m)   Wt 112.4 kg   LMP  (LMP Unknown) Comment: age 71  SpO2 99%   BMI 38.81 kg/m?   ? ?Abdomen: Abdomen is no not tense.  Cannot appreciate any undrained fluid collection on physical exam. ? ?Assessment and plan: ?Patient is status post panniculectomy.  She removed the right side drain at home accidentally before we had planned.  She does not appear to have any obvious undrained fluid collection but she is a difficult exam.  Consider ultrasound on Monday or Tuesday to reassess for any fluid significant fluid collection.   ?

## 2022-02-19 ENCOUNTER — Inpatient Hospital Stay (HOSPITAL_COMMUNITY): Payer: Medicare Other

## 2022-02-19 ENCOUNTER — Telehealth: Payer: Self-pay

## 2022-02-19 DIAGNOSIS — T8140XA Infection following a procedure, unspecified, initial encounter: Secondary | ICD-10-CM | POA: Diagnosis not present

## 2022-02-19 DIAGNOSIS — E871 Hypo-osmolality and hyponatremia: Secondary | ICD-10-CM | POA: Diagnosis not present

## 2022-02-19 DIAGNOSIS — D62 Acute posthemorrhagic anemia: Secondary | ICD-10-CM | POA: Diagnosis not present

## 2022-02-19 DIAGNOSIS — E876 Hypokalemia: Secondary | ICD-10-CM | POA: Diagnosis not present

## 2022-02-19 LAB — TYPE AND SCREEN
ABO/RH(D): A POS
Antibody Screen: NEGATIVE
Unit division: 0
Unit division: 0

## 2022-02-19 LAB — BPAM RBC
Blood Product Expiration Date: 202303222359
Blood Product Expiration Date: 202303222359
ISSUE DATE / TIME: 202303130731
ISSUE DATE / TIME: 202303160351
Unit Type and Rh: 6200
Unit Type and Rh: 6200

## 2022-02-19 LAB — GLUCOSE, CAPILLARY
Glucose-Capillary: 113 mg/dL — ABNORMAL HIGH (ref 70–99)
Glucose-Capillary: 164 mg/dL — ABNORMAL HIGH (ref 70–99)
Glucose-Capillary: 161 mg/dL — ABNORMAL HIGH (ref 70–99)
Glucose-Capillary: 149 mg/dL — ABNORMAL HIGH (ref 70–99)
Glucose-Capillary: 196 mg/dL — ABNORMAL HIGH (ref 70–99)

## 2022-02-19 MED ORDER — FAMOTIDINE 20 MG PO TABS
20.0000 mg | ORAL_TABLET | Freq: Two times a day (BID) | ORAL | Status: DC
  Administered 2022-02-19 – 2022-02-23 (×8): 20 mg via ORAL
  Filled 2022-02-19 (×8): qty 1

## 2022-02-19 MED ORDER — AMOXICILLIN-POT CLAVULANATE 875-125 MG PO TABS
1.0000 | ORAL_TABLET | Freq: Two times a day (BID) | ORAL | Status: DC
  Administered 2022-02-19 – 2022-02-23 (×9): 1 via ORAL
  Filled 2022-02-19 (×9): qty 1

## 2022-02-19 NOTE — Consult Note (Signed)
UNASSIGNED PATIENT Reason for Consult: Difficulty swallowing x 3 days. Referring Physician: [facility] hospitalist  [name] [name] is an 71 y.o. female.  HPI: [name] is a 71 year old white female with multiple medical problems listed below who presented to the ER on 02/14/2022 a week after having a panniculectomy and liposuction with a history of dizziness and a fall at home and worsening abdominal discomfort.  She was seen in the office by a plastic surgeon and started on Keflex.  She was noted to be hypotensive with a blood pressure 89/52 in the ER with a sodium of 125 and potassium of 3.  She was aggressively resuscitated with IV fluids and electrolytes.  She also received antibiotics for 5 days.  She has done well since admission but has developed acute dysphagia 3 days ago.  She feels this started after she took Lyrica.  She feels a big pills hanging up in the esophagus and she has been on a liquid diet; she claims she is unable to swallow solids.  A barium swallow done today was limited in his evaluation but showed poor distention of the gastroesophageal EXTR with a 13 mm barium tablet that would not pass beyond the distal esophagus raising the suspicion for stricture. 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  Viscous lidocaine and Pepcid has not helped much  Past Medical History:  Diagnosis Date   Anxiety    Arthritis    Rhumetoid arthritis   BRCA negative 07/2017   MyRisk neg   Cancer (HCC)    uterine   Cellulitis of both lower extremities    Chronic   CHF (congestive heart failure) (HCC)    Coronary artery disease    Depression    Diabetes mellitus without complication (HCC)    Family history of breast cancer 07/2017   MyRisk neg; IBIS=10.5%/riskscore=17%   Family history of ovarian cancer    Fibromyalgia    Fibromyalgia affecting shoulder region    Headache    Hyperlipidemia    Hypertension    Spinal stenosis    Past Surgical History:   Procedure Laterality Date   ABLATION SAPHENOUS VEIN W/ RFA     ANAL FISSURECTOMY  01/2004   COLON SURGERY     CORONARY ANGIOPLASTY     CYSTOSCOPY  09/03/2017   Procedure: CYSTOSCOPY;  Surgeon: Nadara Mustard, MD;  Location: [facility] ORS;  Service: Gynecology;;   DILATION AND CURETTAGE OF UTERUS     EYE SURGERY Bilateral 08/2020   eye lift   HERNIA REPAIR     LAPAROSCOPIC HYSTERECTOMY N/A 09/03/2017   Procedure: HYSTERECTOMY TOTAL LAPAROSCOPIC BSO;  Surgeon: Nadara Mustard, MD;  Location: [facility] ORS;  Service: Gynecology;  Laterality: N/A;   MEDIAL PARTIAL KNEE REPLACEMENT Left 01/06/2014   PANNICULECTOMY N/A 02/05/2022   Procedure: PANNICULECTOMY;  Surgeon: Peggye Form, DO;  [facility];  Service: Plastics;  Laterality: N/A;  3 hours   UMBILICAL HERNIA REPAIR N/A 09/05/2015   Procedure: HERNIA REPAIR incarcerated UMBILICAL ADULT;  Surgeon: Natale Lay, MD;  Location: [facility] ORS;  Service: General;  Laterality: N/A;   Vascular Stent  11/04/2013    Family History  Problem Relation Age of Onset   Hypertension Mother    Cataracts Mother    Thyroid disease Mother    Dementia Mother    COPD Father    Dementia Father    Cataracts Father    Aneurysm Father        Abdominal &  Brain   Diabetes Father    Melanoma Father    Breast cancer Sister 40   Fibromyalgia Sister    Migraines Sister    Hypertension Sister    Breast cancer Sister 32   COPD Sister    Ovarian cancer Sister 67   Migraines Brother    Heart attack Brother    Colon cancer Paternal Uncle        71s   Colon cancer Paternal Uncle        80s   Uterine cancer Cousin    Uterine cancer Cousin    Social History:  reports that she quit smoking about 25 years ago. Her smoking use included cigarettes. She has a 29.00 pack-year smoking history. She has never used smokeless tobacco. She reports that she does not drink alcohol and does not use drugs.  Allergies:  Allergies  Allergen Reactions   Sulfa Antibiotics  Itching, Rash and Swelling   Latex Itching   Doxycycline Rash   Empagliflozin Itching and Other (See Comments)    Itching and frequent yeast infections    Medications: I have reviewed the patient's current medications. Prior to Admission:  Medications Prior to Admission  Medication Sig Dispense Refill Last Dose   acetaminophen (TYLENOL) 325 MG tablet Take 650 mg by mouth every 6 (six) hours as needed for moderate pain.   Past Week   Biotin w/ Vitamins C & E (HAIR/SKIN/NAILS PO) Take 1 tablet by mouth daily.   02/14/2022   buPROPion (WELLBUTRIN XL) 300 MG 24 hr tablet TAKE 1 TABLET BY MOUTH EVERY DAY (Patient taking differently: Take 300 mg by mouth daily.) 90 tablet 1 02/14/2022   carvedilol (COREG) 6.25 MG tablet Take 1 tablet (6.25 mg total) by mouth 2 (two) times daily with a meal. 60 tablet 3 02/14/2022 at 1100   [EXPIRED] cephALEXin (KEFLEX) 500 MG capsule Take 1 capsule (500 mg total) by mouth 4 (four) times daily for 5 days. 20 capsule 0 02/14/2022   clonazePAM (KLONOPIN) 0.5 MG tablet Take 0.5-1 tablets (0.25-0.5 mg total) by mouth 2 (two) times daily as needed for anxiety. 30 tablet 0 unk   DULoxetine (CYMBALTA) 60 MG capsule TAKE 1 CAPSULE BY MOUTH EVERY DAY (Patient taking differently: Take 60 mg by mouth daily.) 90 capsule 0 02/14/2022   ezetimibe (ZETIA) 10 MG tablet TAKE 1 TABLET BY MOUTH EVERY DAY (Patient taking differently: Take 10 mg by mouth daily.) 90 tablet 3 02/14/2022   losartan (COZAAR) 25 MG tablet TAKE 1 TABLET (25 MG TOTAL) BY MOUTH DAILY. 90 tablet 0 02/14/2022   metFORMIN (GLUCOPHAGE) 500 MG tablet TAKE 1 TABLET BY MOUTH 2 TIMES DAILY WITH A MEAL. (Patient taking differently: Take 500 mg by mouth 2 (two) times daily with a meal.) 180 tablet 1 02/14/2022   metoprolol succinate (TOPROL-XL) 25 MG 24 hr tablet Take 25 mg by mouth daily.   02/14/2022   ondansetron (ZOFRAN-ODT) 4 MG disintegrating tablet Take 1 tablet (4 mg total) by mouth every 8 (eight) hours as needed for  nausea or vomiting. 20 tablet 0 Past Month   pregabalin (LYRICA) 75 MG capsule Take 1 capsule (75 mg total) by mouth 3 (three) times daily. 90 capsule 1 02/14/2022   tolterodine (DETROL LA) 4 MG 24 hr capsule Take 1 capsule (4 mg total) by mouth daily. 90 capsule 3 02/14/2022   Vibegron (GEMTESA) 75 MG TABS Take 75 mg by mouth daily. 90 tablet 3 02/14/2022   Scheduled:  amoxicillin-clavulanate  1 tablet  Oral Q12H   buPROPion  300 mg Oral Daily   DULoxetine  60 mg Oral Daily   famotidine  20 mg Oral BID   fesoterodine  8 mg Oral Daily   insulin aspart  0-15 Units Subcutaneous TID WC   lidocaine  15 mL Mouth/Throat Q4H   polyethylene glycol  17 g Oral Daily   pregabalin  75 mg Oral TID   Continuous:  sodium chloride     JYN:WGNF & mag hydroxide-simeth, clonazePAM, morphine injection, ondansetron (ZOFRAN) IV  Results for orders placed or performed during the hospital encounter of 02/14/22 (from the past 48 hour(s))  Glucose, capillary     Status: Abnormal   Collection Time: 02/17/22  4:21 PM  Result Value Ref Range   Glucose-Capillary 147 (H) 70 - 99 mg/dL    Comment: Glucose reference range applies only to samples taken after fasting for at least 8 hours.  Glucose, capillary     Status: Abnormal   Collection Time: 02/17/22  9:30 PM  Result Value Ref Range   Glucose-Capillary 137 (H) 70 - 99 mg/dL    Comment: Glucose reference range applies only to samples taken after fasting for at least 8 hours.  Basic metabolic panel     Status: Abnormal   Collection Time: 02/18/22 12:32 AM  Result Value Ref Range   Sodium 135 135 - 145 mmol/L   Potassium 3.9 3.5 - 5.1 mmol/L   Chloride 102 98 - 111 mmol/L   CO2 26 22 - 32 mmol/L   Glucose, Bld 178 (H) 70 - 99 mg/dL    Comment: Glucose reference range applies only to samples taken after fasting for at least 8 hours.   BUN 6 (L) 8 - 23 mg/dL   Creatinine, Ser 6.21 0.44 - 1.00 mg/dL   Calcium 7.8 (L) 8.9 - 10.3 mg/dL   GFR, Estimated >30 >86  mL/min    Comment: (NOTE) Calculated using the CKD-EPI Creatinine Equation (2021)    Anion gap 7 5 - 15    Comment: Performed at [facility], [address], [place], [place] [address]  CBC with Differential/Platelet     Status: Abnormal   Collection Time: 02/18/22 12:32 AM  Result Value Ref Range   WBC 7.0 4.0 - 10.5 K/uL   RBC 3.08 (L) 3.87 - 5.11 MIL/uL   Hemoglobin 8.0 (L) 12.0 - 15.0 g/dL   HCT 96.2 (L) 95.2 - 84.1 %   MCV 84.4 80.0 - 100.0 fL   MCH 26.0 26.0 - 34.0 pg   MCHC 30.8 30.0 - 36.0 g/dL   RDW 32.4 (H) 40.1 - 02.7 %   Platelets 391 150 - 400 K/uL   nRBC 0.3 (H) 0.0 - 0.2 %   Neutrophils Relative % 57 %   Neutro Abs 4.0 1.7 - 7.7 K/uL   Lymphocytes Relative 25 %   Lymphs Abs 1.8 0.7 - 4.0 K/uL   Monocytes Relative 10 %   Monocytes Absolute 0.7 0.1 - 1.0 K/uL   Eosinophils Relative 4 %   Eosinophils Absolute 0.3 0.0 - 0.5 K/uL   Basophils Relative 1 %   Basophils Absolute 0.1 0.0 - 0.1 K/uL   nRBC 2 (H) 0 /100 WBC   Metamyelocytes Relative 1 %   Myelocytes 2 %   Abs Immature Granulocytes 0.20 (H) 0.00 - 0.07 K/uL    Comment: Performed at [facility], [address], [place], [place] [address]  Glucose, capillary  Status: Abnormal   Collection Time: 02/18/22  7:24 AM  Result Value Ref Range   Glucose-Capillary 173 (H) 70 - 99 mg/dL    Comment: Glucose reference range applies only to samples taken after fasting for at least 8 hours.  Glucose, capillary     Status: Abnormal   Collection Time: 02/18/22 11:19 AM  Result Value Ref Range   Glucose-Capillary 163 (H) 70 - 99 mg/dL    Comment: Glucose reference range applies only to samples taken after fasting for at least 8 hours.  Glucose, capillary     Status: None   Collection Time: 02/18/22  4:09 PM  Result Value Ref Range   Glucose-Capillary 98 70 - 99 mg/dL    Comment: Glucose reference range applies only to samples taken after fasting for at least 8 hours.  Glucose, capillary     Status:  Abnormal   Collection Time: 02/18/22  9:20 PM  Result Value Ref Range   Glucose-Capillary 157 (H) 70 - 99 mg/dL    Comment: Glucose reference range applies only to samples taken after fasting for at least 8 hours.  Glucose, capillary     Status: Abnormal   Collection Time: 02/19/22  7:32 AM  Result Value Ref Range   Glucose-Capillary 164 (H) 70 - 99 mg/dL    Comment: Glucose reference range applies only to samples taken after fasting for at least 8 hours.  Glucose, capillary     Status: Abnormal   Collection Time: 02/19/22 11:41 AM  Result Value Ref Range   Glucose-Capillary 161 (H) 70 - 99 mg/dL    Comment: Glucose reference range applies only to samples taken after fasting for at least 8 hours.   Review of Systems  Constitutional: Negative.   HENT: Negative.    Eyes: Negative.   Respiratory: Negative.    Cardiovascular: Negative.   Gastrointestinal:  Positive for abdominal pain. Negative for abdominal distention, anal bleeding, blood in stool, constipation, diarrhea and nausea.  Endocrine: Negative.   Genitourinary: Negative.   Musculoskeletal:  Positive for arthralgias and back pain.  Allergic/Immunologic: Negative.   Neurological: Negative.   Hematological: Negative.   Psychiatric/Behavioral: Negative.    Blood pressure (!) 128/51, pulse 87, temperature 98.3 F (36.8 C), resp. rate 18, height 5\' 7"  (1.702 m), weight 112.4 kg, SpO2 99 %. Physical Exam Constitutional:      General: She is not in acute distress.    Appearance: She is ill-appearing.  HENT:     Head: Normocephalic and atraumatic.     Mouth/Throat:     Mouth: Mucous membranes are dry.     Pharynx: Oropharynx is clear.  Eyes:     Extraocular Movements: Extraocular movements intact.     Pupils: Pupils are equal, round, and reactive to light.  Cardiovascular:     Rate and Rhythm: Normal rate and regular rhythm.     Pulses: Normal pulses.     Heart sounds: Normal heart sounds.  Pulmonary:     Effort:  Pulmonary effort is normal.     Breath sounds: Normal breath sounds.  Abdominal:     General: There is no distension.     Palpations: Abdomen is soft.     Tenderness: There is no abdominal tenderness.     Comments: Transfer scars on lower abdomen from recent surgery with crusting of the scab over the incision  Musculoskeletal:     Cervical back: Normal range of motion and neck supple.  Skin:    General: Skin is  warm and dry.  Neurological:     Mental Status: She is alert and oriented to person, place, and time.  Psychiatric:        Mood and Affect: Mood normal.        Behavior: Behavior normal.        Thought Content: Thought content normal.        Judgment: Judgment normal.   Assessment/Plan: 1) Acute dysphagia with abnormal barium swallow showing a stricture at the GE junction with hanging of the 13 mm barium pill-EGD planned for tomorrow with possible dilation. 2) Postoperative seroma/infection-improved. 3)Hyponatremia/hypophosphatemia/hypokalemia/hypomagnesemia. 4) Chronic back pain/sciatica on Lyrica. 5) Anemia associate with acute blood loss. 6) HTN. 7_ AODM  Charna Elizabeth 02/19/2022, 3:16 PM

## 2022-02-19 NOTE — Telephone Encounter (Signed)
Copied from [place] [phone]. Topic: General - Other ?>> Feb 19, 2022  2:58 PM Leward Quan A wrote: ?Reason for CRM: Patient called in asking if Dr B can please give her a call back say that she is in the hospital been there for 6 days now Has something that she need to discuss with Dr B and can be reached at Ph# [phone] ?

## 2022-02-19 NOTE — Plan of Care (Signed)
?  Problem: Health Behavior/Discharge Planning: ?Goal: Ability to manage health-related needs will improve ?Outcome: Progressing ?  ?Problem: Clinical Measurements: ?Goal: Ability to maintain clinical measurements within normal limits will improve ?Outcome: Progressing ?Goal: Will remain free from infection ?Outcome: Progressing ?Goal: Diagnostic test results will improve ?Outcome: Progressing ?  ?Problem: Elimination: ?Goal: Will not experience complications related to bowel motility ?Outcome: Progressing ?Goal: Will not experience complications related to urinary retention ?Outcome: Progressing ?  ?Problem: Pain Managment: ?Goal: General experience of comfort will improve ?Outcome: Progressing ?  ?Problem: Safety: ?Goal: Ability to remain free from injury will improve ?Outcome: Progressing ?  ?Problem: Skin Integrity: ?Goal: Risk for impaired skin integrity will decrease ?Outcome: Progressing ?  ?

## 2022-02-19 NOTE — Telephone Encounter (Signed)
FYI patient is currently admitted at [facility].  ?

## 2022-02-19 NOTE — H&P (View-Only) (Signed)
UNASSIGNED PATIENT ?Reason for Consult: Difficulty swallowing x 3 days. ?Referring Physician: [facility] hospitalist ? ?[name] [name] is an 71 y.o. female.  ?HPI: [name] is a 71 year old white female with multiple medical problems listed below who presented to the ER on 02/14/2022 a week after having a panniculectomy and liposuction with a history of dizziness and a fall at home and worsening abdominal discomfort.  She was seen in the office by a plastic surgeon and started on Keflex.  She was noted to be hypotensive with a blood pressure 89/52 in the ER with a sodium of 125 and potassium of 3.  She was aggressively resuscitated with IV fluids and electrolytes.  She also received antibiotics for 5 days.  She has done well since admission but has developed acute dysphagia 3 days ago.  She feels this started after she took Lyrica.  She feels a big pills hanging up in the esophagus and she has been on a liquid diet; she claims she is unable to swallow solids.  A barium swallow done today was limited in his evaluation but showed poor distention of the gastroesophageal EXTR with a 13 mm barium tablet that would not pass beyond the distal esophagus raising the suspicion for stricture. 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  Viscous lidocaine and Pepcid has not helped much ? ?Past Medical History:  ?Diagnosis Date  ? Anxiety   ? Arthritis   ? Rhumetoid arthritis  ? BRCA negative 07/2017  ? MyRisk neg  ? Cancer [facility])   ? uterine  ? Cellulitis of both lower extremities   ? Chronic  ? CHF (congestive heart failure) ([place])   ? Coronary artery disease   ? Depression   ? Diabetes mellitus without complication ([place])   ? Family history of breast cancer 07/2017  ? MyRisk neg; IBIS=10.5%/riskscore=17%  ? Family history of ovarian cancer   ? Fibromyalgia   ? Fibromyalgia affecting shoulder region   ? Headache   ? Hyperlipidemia   ? Hypertension   ? Spinal stenosis   ? ?Past Surgical History:   ?Procedure Laterality Date  ? ABLATION SAPHENOUS VEIN W/ RFA    ? ANAL FISSURECTOMY  01/2004  ? COLON SURGERY    ? CORONARY ANGIOPLASTY    ? CYSTOSCOPY  09/03/2017  ? Procedure: CYSTOSCOPY;  Surgeon: Gae Dry, MD;  Location: [facility] ORS;  Service: Gynecology;;  ? DILATION AND CURETTAGE OF UTERUS    ? EYE SURGERY Bilateral 08/2020  ? eye lift  ? HERNIA REPAIR    ? LAPAROSCOPIC HYSTERECTOMY N/A 09/03/2017  ? Procedure: HYSTERECTOMY TOTAL LAPAROSCOPIC BSO;  Surgeon: Gae Dry, MD;  Location: [facility] ORS;  Service: Gynecology;  Laterality: N/A;  ? MEDIAL PARTIAL KNEE REPLACEMENT Left 01/06/2014  ? PANNICULECTOMY N/A 02/05/2022  ? Procedure: PANNICULECTOMY;  Surgeon: Wallace Going, DO;  Location: [facility];  Service: Plastics;  Laterality: N/A;  3 hours  ? UMBILICAL HERNIA REPAIR N/A 09/05/2015  ? Procedure: HERNIA REPAIR incarcerated UMBILICAL ADULT;  Surgeon: Sherri Rad, MD;  Location: [facility] ORS;  Service: General;  Laterality: N/A;  ? Vascular Stent  11/04/2013  ?  ?Family History  ?Problem Relation Age of Onset  ? Hypertension Mother   ? Cataracts Mother   ? Thyroid disease Mother   ? Dementia Mother   ? COPD Father   ? Dementia Father   ? Cataracts Father   ? Aneurysm Father   ?     Abdominal &  Brain  ? Diabetes Father   ? Melanoma Father   ? Breast cancer Sister 67  ? Fibromyalgia Sister   ? Migraines Sister   ? Hypertension Sister   ? Breast cancer Sister 78  ? COPD Sister   ? Ovarian cancer Sister 25  ? Migraines Brother   ? Heart attack Brother   ? Colon cancer Paternal Uncle   ?     73s  ? Colon cancer Paternal Uncle   ?     67s  ? Uterine cancer Cousin   ? Uterine cancer Cousin   ? ?Social History:  reports that she quit smoking about 25 years ago. Her smoking use included cigarettes. She has a 29.00 pack-year smoking history. She has never used smokeless tobacco. She reports that she does not drink alcohol and does not use drugs. ? ?Allergies:  ?Allergies  ?Allergen Reactions  ? Sulfa Antibiotics  Itching, Rash and Swelling  ? Latex Itching  ? Doxycycline Rash  ? Empagliflozin Itching and Other (See Comments)  ?  Itching and frequent yeast infections  ? ? ?Medications: I have reviewed the patient's current medications. ?Prior to Admission:  ?Medications Prior to Admission  ?Medication Sig Dispense Refill Last Dose  ? acetaminophen (TYLENOL) 325 MG tablet Take 650 mg by mouth every 6 (six) hours as needed for moderate pain.   Past Week  ? Biotin w/ Vitamins C & E (HAIR/SKIN/NAILS PO) Take 1 tablet by mouth daily.   02/14/2022  ? buPROPion (WELLBUTRIN XL) 300 MG 24 hr tablet TAKE 1 TABLET BY MOUTH EVERY DAY (Patient taking differently: Take 300 mg by mouth daily.) 90 tablet 1 02/14/2022  ? carvedilol (COREG) 6.25 MG tablet Take 1 tablet (6.25 mg total) by mouth 2 (two) times daily with a meal. 60 tablet 3 02/14/2022 at 1100  ? [EXPIRED] cephALEXin (KEFLEX) 500 MG capsule Take 1 capsule (500 mg total) by mouth 4 (four) times daily for 5 days. 20 capsule 0 02/14/2022  ? clonazePAM (KLONOPIN) 0.5 MG tablet Take 0.5-1 tablets (0.25-0.5 mg total) by mouth 2 (two) times daily as needed for anxiety. 30 tablet 0 unk  ? DULoxetine (CYMBALTA) 60 MG capsule TAKE 1 CAPSULE BY MOUTH EVERY DAY (Patient taking differently: Take 60 mg by mouth daily.) 90 capsule 0 02/14/2022  ? ezetimibe (ZETIA) 10 MG tablet TAKE 1 TABLET BY MOUTH EVERY DAY (Patient taking differently: Take 10 mg by mouth daily.) 90 tablet 3 02/14/2022  ? losartan (COZAAR) 25 MG tablet TAKE 1 TABLET (25 MG TOTAL) BY MOUTH DAILY. 90 tablet 0 02/14/2022  ? metFORMIN (GLUCOPHAGE) 500 MG tablet TAKE 1 TABLET BY MOUTH 2 TIMES DAILY WITH A MEAL. (Patient taking differently: Take 500 mg by mouth 2 (two) times daily with a meal.) 180 tablet 1 02/14/2022  ? metoprolol succinate (TOPROL-XL) 25 MG 24 hr tablet Take 25 mg by mouth daily.   02/14/2022  ? ondansetron (ZOFRAN-ODT) 4 MG disintegrating tablet Take 1 tablet (4 mg total) by mouth every 8 (eight) hours as needed for  nausea or vomiting. 20 tablet 0 Past Month  ? pregabalin (LYRICA) 75 MG capsule Take 1 capsule (75 mg total) by mouth 3 (three) times daily. 90 capsule 1 02/14/2022  ? tolterodine (DETROL LA) 4 MG 24 hr capsule Take 1 capsule (4 mg total) by mouth daily. 90 capsule 3 02/14/2022  ? Vibegron (GEMTESA) 75 MG TABS Take 75 mg by mouth daily. 90 tablet 3 02/14/2022  ? ?Scheduled: ? amoxicillin-clavulanate  1 tablet  Oral Q12H  ? buPROPion  300 mg Oral Daily  ? DULoxetine  60 mg Oral Daily  ? famotidine  20 mg Oral BID  ? fesoterodine  8 mg Oral Daily  ? insulin aspart  0-15 Units Subcutaneous TID WC  ? lidocaine  15 mL Mouth/Throat Q4H  ? polyethylene glycol  17 g Oral Daily  ? pregabalin  75 mg Oral TID  ? ?Continuous: ? sodium chloride    ? ?AEP:NTBH & mag hydroxide-simeth, clonazePAM, morphine injection, ondansetron (ZOFRAN) IV ? ?Results for orders placed or performed during the hospital encounter of 02/14/22 (from the past 48 hour(s))  ?Glucose, capillary     Status: Abnormal  ? Collection Time: 02/17/22  4:21 PM  ?Result Value Ref Range  ? Glucose-Capillary 147 (H) 70 - 99 mg/dL  ?  Comment: Glucose reference range applies only to samples taken after fasting for at least 8 hours.  ?Glucose, capillary     Status: Abnormal  ? Collection Time: 02/17/22  9:30 PM  ?Result Value Ref Range  ? Glucose-Capillary 137 (H) 70 - 99 mg/dL  ?  Comment: Glucose reference range applies only to samples taken after fasting for at least 8 hours.  ?Basic metabolic panel     Status: Abnormal  ? Collection Time: 02/18/22 12:32 AM  ?Result Value Ref Range  ? Sodium 135 135 - 145 mmol/L  ? Potassium 3.9 3.5 - 5.1 mmol/L  ? Chloride 102 98 - 111 mmol/L  ? CO2 26 22 - 32 mmol/L  ? Glucose, Bld 178 (H) 70 - 99 mg/dL  ?  Comment: Glucose reference range applies only to samples taken after fasting for at least 8 hours.  ? BUN 6 (L) 8 - 23 mg/dL  ? Creatinine, Ser 0.66 0.44 - 1.00 mg/dL  ? Calcium 7.8 (L) 8.9 - 10.3 mg/dL  ? GFR, Estimated >60 >60  mL/min  ?  Comment: (NOTE) ?Calculated using the CKD-EPI Creatinine Equation (2021) ?  ? Anion gap 7 5 - 15  ?  Comment: Performed at [facility], [place] [address] [place], [place] [address]  ?CBC with Sharma Covert

## 2022-02-19 NOTE — Care Management Important Message (Signed)
Important Message ? ?Patient Details  ?Name: [name] [name] ?MRN: [id number] ?Date of Birth: [birth year] ? ? ?Medicare Important Message Given:  Yes ? ? ? ? ?Anjenette Gerbino ?02/19/2022, 3:34 PM ?

## 2022-02-19 NOTE — Consult Note (Signed)
? ?  [facility] CM Inpatient Consult ? ? ?02/19/2022 ? ?[name] ?[birth year] ?[id number] ? ?[place] [facility] [ACO] Patient: [name] [name] ? ?Primary Care Provider:  Virginia Crews, MD  [facility] is an embedded provider with a Chronic Care Management team and program, and is listed for the transition of care follow up and appointments. ? ?Patient was screened for Embedded practice service needs for chronic care management for post hospital follow up. ? ?Met with the patient, she states she's feeling slightly better, awaiting to go to radiology. Explained Embedded CCM and she states regular follow ups, no problems making appointments.  Gave her an appointment reminder card and a 24 hour nurse advise line magnet.  No CCM needs assessed at this current time.  ? ?Plan: A referral request can be sent to the [place] Management and made aware of TOC needs for post hospital needs. Will continue to follow for needs. ? ?Please contact for further questions, ? ?Natividad Brood, RN BSN CCM ?[facility] Liaison ? [phone] business mobile phone ?Toll free office [phone]  ?Fax number: [phone] ?[email or web address] ? ? ? ?

## 2022-02-19 NOTE — Progress Notes (Signed)
Physical Therapy Treatment ?Patient Details ?Name: LENIYA BREIT ?MRN: [id number] ?DOB: 1951/10/08 ?Today's Date: 02/19/2022 ? ? ?History of Present Illness 71 y/o female presented to ED on 02/14/22 following fall where she felt lightheaded and hit head. PMH: HTN, DM2, obesity s/p liposuction and panniculectomy on 02/05/22, CHF, uterine cancer ? ?  ?PT Comments  ? ? Pt was seen for mobility on RW and to do there ex to BLE's.  She is getting up to commode with minor help but requires assist for cleaning up after.  Will recommend HHPT with the understanding that she is not able to get into and out of the bed or stand without assist, and will need to ensure she has help for these tasks.  Follow acutely for PT goals as outlined in POC, and work on her awareness of how to manage abd weakness and precautions.   ?Recommendations for follow up therapy are one component of a multi-disciplinary discharge planning process, led by the attending physician.  Recommendations may be updated based on patient status, additional functional criteria and insurance authorization. ? ?Follow Up Recommendations ? Home health PT ?  ?  ?Assistance Recommended at Discharge Intermittent Supervision/Assistance  ?Patient can return home with the following A little help with walking and/or transfers;A little help with bathing/dressing/bathroom;Assistance with cooking/housework;Help with stairs or ramp for entrance;Assist for transportation ?  ?Equipment Recommendations ? None recommended by PT  ?  ?Recommendations for Other Services   ? ? ?  ?Precautions / Restrictions Precautions ?Precautions: Fall ?Precaution Comments: pt has lost both JP drains ?Restrictions ?Weight Bearing Restrictions: No  ?  ? ?Mobility ? Bed Mobility ?Overal bed mobility: Needs Assistance ?Bed Mobility: Rolling, Sit to Sidelying ?Rolling: Min guard ?  ?  ?  ?Sit to sidelying: Min assist (assisted with legs only) ?  ?  ? ?Transfers ?Overall transfer level: Needs  assistance ?Equipment used: 1 person hand held assist ?Transfers: Sit to/from Stand ?Sit to Stand: Min assist ?  ?  ?  ?  ?  ?General transfer comment: Pt requires minor support to clear commode to stand and requires help to get cleaned from bathroom ?  ? ?Ambulation/Gait ?Ambulation/Gait assistance: Min guard ?Gait Distance (Feet): 20 Feet ?Assistive device: Rolling walker (2 wheels), 1 person hand held assist ?Gait Pattern/deviations: Step-through pattern, Decreased stride length, Wide base of support, Trunk flexed ?Gait velocity: reduced ?Gait velocity interpretation: <1.31 ft/sec, indicative of household ambulator ?Pre-gait activities: standing balance correction with posture ?General Gait Details: mild flexion posture due to abd weakness and discomfort ? ? ?Stairs ?  ?  ?  ?  ?  ? ? ?Wheelchair Mobility ?  ? ?Modified Rankin (Stroke Patients Only) ?  ? ? ?  ?Balance Overall balance assessment: Mild deficits observed, not formally tested ?  ?  ?  ?  ?  ?  ?  ?  ?  ?  ?  ?  ?  ?  ?  ?  ?  ?  ?  ? ?  ?Cognition Arousal/Alertness: Awake/alert ?Behavior During Therapy: [facility] for tasks assessed/performed ?Overall Cognitive Status: Within Functional Limits for tasks assessed ?  ?  ?  ?  ?  ?  ?  ?  ?  ?  ?  ?  ?  ?  ?  ?  ?  ?  ?  ? ?  ?Exercises General Exercises - Lower Extremity ?Ankle Circles/Pumps: AROM, 5 reps ?Long Arc Quad: Strengthening, 10 reps ?Heel Slides: Strengthening, 10 reps ?  Hip ABduction/ADduction: AROM, 10 reps ? ?  ?General Comments General comments (skin integrity, edema, etc.): pt is corrected for sit posture and sits on side of bed with tendency to list backward afterward due to abd weakness ?  ?  ? ?Pertinent Vitals/Pain Pain Assessment ?Pain Assessment: Faces ?Faces Pain Scale: Hurts a little bit ?Pain Location: abdomen ?Pain Descriptors / Indicators: Guarding ?Pain Intervention(s): Limited activity within patient's tolerance, Repositioned  ? ? ?Home Living   ?  ?  ?  ?  ?  ?  ?  ?  ?  ?   ?   ?Prior Function    ?  ?  ?   ? ?PT Goals (current goals can now be found in the care plan section) Acute Rehab PT Goals ?Patient Stated Goal: to go home ?Progress towards PT goals: Progressing toward goals ? ?  ?Frequency ? ? ? Min 3X/week ? ? ? ?  ?PT Plan Current plan remains appropriate  ? ? ?Co-evaluation   ?  ?  ?  ?  ? ?  ?AM-PAC PT "6 Clicks" Mobility   ?Outcome Measure ? Help needed turning from your back to your side while in a flat bed without using bedrails?: A Little ?Help needed moving from lying on your back to sitting on the side of a flat bed without using bedrails?: A Little ?Help needed moving to and from a bed to a chair (including a wheelchair)?: A Little ?Help needed standing up from a chair using your arms (e.g., wheelchair or bedside chair)?: A Little ?Help needed to walk in hospital room?: A Little ?Help needed climbing 3-5 steps with a railing? : A Lot ?6 Click Score: 17 ? ?  ?End of Session Equipment Utilized During Treatment: Gait belt ?Activity Tolerance: Patient limited by pain ?Patient left: in bed;with bed alarm set;with call bell/phone within reach;with nursing/sitter in room ?Nurse Communication: Mobility status ?PT Visit Diagnosis: Unsteadiness on feet (R26.81);Muscle weakness (generalized) (M62.81);History of falling (Z91.81);Pain ?Pain - Right/Left:  (abdomen) ?Pain - part of body:  (abdomen) ?  ? ? ?Time: 0277-4128 ?PT Time Calculation (min) (ACUTE ONLY): 23 min ? ?Charges:  $Gait Training: 8-22 mins ?$Therapeutic Exercise: 8-22 mins    ?Ramond Dial ?02/19/2022, 5:09 PM ? ?Mee Hives, PT PhD ?Acute Rehab Dept. Number: [facility] [phone] and [place] [phone] ? ? ?

## 2022-02-19 NOTE — TOC Progression Note (Signed)
Transition of Care (TOC) - Progression Note  ? ? ?Patient Details  ?Name: [name] [name] ?MRN: [id number] ?Date of Birth: 04-24-1951 ? ?Transition of Care (TOC) CM/SW Contact  ?Tom-Johnson, Renea Ee, RN ?Phone Number: ?02/19/2022, 1:33 PM ? ?Clinical Narrative:    ? ?PT recommended home health. CM gave list of agencies from Medicare.gov to patient and she chose [facility]. Referral called in to [facility] with acceptance voiced. Information on AVS. CM will continue to follow with needs. ? ?Expected Discharge Plan: [place] ?Barriers to Discharge: Continued Medical Work up ? ?Expected Discharge Plan and Services ?Expected Discharge Plan: [place] ?  ?Discharge Planning Services: CM Consult ?Post Acute Care Choice: Home Health ?Living arrangements for the past 2 months: [place] ?                ?DME Arranged: N/A ?DME Agency: NA ?  ?  ?  ?HH Arranged: PT ?[place] Agency: [facility] ?Date HH Agency Contacted: 02/19/22 ?Time Holden: 1320 ?Representative spoke with at [place]: Delsa Sale ? ? ?Social Determinants of Health (SDOH) Interventions ?  ? ?Readmission Risk Interventions ?No flowsheet data found. ? ?

## 2022-02-19 NOTE — Progress Notes (Signed)
?PROGRESS NOTE ? ? ? ?[name] [name]  MGN:[id number] DOB: 04/27/1951 DOA: 02/14/2022 ?PCP: Virginia Crews, MD  ? ? ?Brief Narrative:  ?71 year old female with history of type 2 diabetes, hypertension, obesity status post liposuction and panniculectomy on 3/6 presented from home with fall, dizziness and abdominal wall discomfort. ?Liposuction and panniculectomy 3/6, seen and followed by plastic surgery, recently seen in the office and started on Keflex.  She has 2 JP drains, apparently disconnected while falling.  In the emergency room, presentation blood pressure 89/52, sodium 125, potassium 3.  Started on IV fluid resuscitation, started on antibiotics and admitted to the hospital. ? ? ?Assessment & Plan:  ?Hypovolemic hyponatremia: Improved and normalized.  Renal functions are adequate. ? ?Postoperative seroma/postop infection: ?CT scan consistent with postop findings, subcutaneous air but no drainable fluid collection.  WBC count is normal.  She was on Keflex as outpatient.  ?Blood cultures negative so far.  ?Culture from JP drain with mixed organism, Eiknela.  MRSA swab negative.  ?Repeat CT scan 3/17, stable findings.  No drainable collection.  ?Received 5 days of IV antibiotics.  No evidence of purulence or collection, will change to Augmentin for broader spectrum coverage to treat for total 14 days. ? ?Nausea vomiting, improved.  She has significant difficulty eating and midsternal pain on swallowing.  ?Not much response to lidocaine viscus.  Started after episodes of vomiting.  ?Continue lidocaine, Pepcid.  Unable to swallow solid food.  ?Esophagogram with barium today, if any filling defect, will discuss with GI for upper GI endoscopy.  ? ?Essential hypertension: Holding antihypertensives. ? ?Hypokalemia/hypomagnesemia/hypophosphatemia: Replaced.  Replaced and adequate. ? 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  Continue. ? ?Type 2 diabetes: On metformin.  Well-controlled at home.   Holding metformin.  on sliding scale insulin. ? ?Anemia associated with acute blood loss:  ?Preop hemoglobin 11, presentation hemoglobin 9-7.8-8-8. ?Currently no indication for blood transfusion.  Remains stable. ? ? ?DVT prophylaxis: Place and maintain sequential compression device Start: 02/16/22 0222 ?SCDs Start: 02/15/22 0338 ? ? ?Code Status: Full code ?Family Communication: None.  Patient is talking to her sister. ?Disposition Plan: Status is: Inpatient. ? ?Remains as inpatient.  Abdominal pain, unable to eat.  Persistent massive drainage from abdominal wall wound. ?  ? ? ?Consultants:  ?Plastic surgery. ? ?Procedures:  ?None ? ?Antimicrobials:  ?Rocephin 3/15-3/16 ?Vancomycin and cefepime 3/16--- 3/18 ?Cefepime 3/18--- 3/20 ?Augmentin 3/20---- ? ? ?Subjective: ? ?Patient seen and examined.  Last 24 hours she continues to have midsternal pain with attempt to eat.  Able to eat liquids but no solids.  Denies any nausea or vomiting.  Lower abdominal pain is controlled. ?Her tube from right flank also came out and now with massive drainage soaking her bed.  Bowel functions normal. ? ?Objective: ?Vitals:  ? 02/18/22 1718 02/18/22 2117 02/19/22 0429 02/19/22 0925  ?BP: 130/62 100/82 (!) 125/53 (!) 128/51  ?Pulse: 88 85 79 87  ?Resp: '16 18 18 18  '$ ?Temp: 97.6 ?F (36.4 ?C) 98.8 ?F (37.1 ?C) 97.8 ?F (36.6 ?C) 98.3 ?F (36.8 ?C)  ?TempSrc: Oral Oral Oral   ?SpO2: 100% 100% 98% 99%  ?Weight:      ?Height:      ? ? ?Intake/Output Summary (Last 24 hours) at 02/19/2022 1131 ?Last data filed at 02/19/2022 0800 ?Gross per 24 hour  ?Intake 2423.37 ml  ?Output 1450 ml  ?Net 973.37 ml  ? ?Filed Weights  ? 02/14/22 2226 02/15/22 1500 02/17/22 2059  ?Weight:  99.8 kg 102.6 kg 112.4 kg  ? ? ?Examination: ? ?General: Mildly anxious.  Walking to the bathroom. ?Cardiovascular: S1-S2 normal.  Regular rate rhythm. ?Respiratory: Bilateral clear. ?Gastrointestinal: Soft.  Mild tenderness along the incision line.  Both flanks with JP tube out,  massive thin purulent drainage. ?Ext: No deformities.  No cyanosis or edema. ?Neuro: No deficits. ? ? ? ? ?Data Reviewed: I have personally reviewed following labs and imaging studies ? ?CBC: ?Recent Labs  ?Lab 02/15/22 ?0916 02/15/22 ?1551 02/16/22 ?0247 02/17/22 ?0705 02/18/22 ?0032  ?WBC 5.2 5.7 6.5 6.1 7.0  ?NEUTROABS 3.1 3.4 5.4 3.8 4.0  ?HGB 7.8* 8.6* 8.8* 8.3* 8.0*  ?HCT 24.9* 26.3* 27.6* 26.4* 26.0*  ?MCV 82.7 81.7 81.2 84.1 84.4  ?PLT 347 371 400 385 391  ? ?Basic Metabolic Panel: ?Recent Labs  ?Lab 02/15/22 ?0916 02/15/22 ?1551 02/16/22 ?0247 02/17/22 ?0705 02/18/22 ?0032  ?NA 128* 132* 131* 134* 135  ?K 3.0* 3.1* 3.1* 3.8 3.9  ?CL 92* 97* 95* 102 102  ?CO2 '24 25 23 23 26  '$ ?GLUCOSE 251* 189* 304* 231* 178*  ?BUN <5* <5* 6* 6* 6*  ?CREATININE 0.58 0.68 0.58 0.58 0.66  ?CALCIUM 7.7* 7.7* 8.0* 7.7* 7.8*  ?MG 1.7  --  2.1 2.0  --   ?PHOS 1.5*  --  2.9 2.2*  --   ? ?GFR: ?Estimated Creatinine Clearance: 87 mL/min (by C-G formula based on SCr of 0.66 mg/dL). ?Liver Function Tests: ?Recent Labs  ?Lab 02/14/22 ?2334  ?AST 11*  ?ALT 14  ?ALKPHOS 94  ?BILITOT 0.3  ?PROT 6.1*  ?ALBUMIN 1.6*  ? ?No results for input(s): LIPASE, AMYLASE in the last 168 hours. ?No results for input(s): AMMONIA in the last 168 hours. ?Coagulation Profile: ?Recent Labs  ?Lab 02/14/22 ?2334  ?INR 1.2  ? ?Cardiac Enzymes: ?No results for input(s): CKTOTAL, CKMB, CKMBINDEX, TROPONINI in the last 168 hours. ?BNP (last 3 results) ?No results for input(s): PROBNP in the last 8760 hours. ?HbA1C: ?No results for input(s): HGBA1C in the last 72 hours. ?CBG: ?Recent Labs  ?Lab 02/18/22 ?0724 02/18/22 ?1119 02/18/22 ?1609 02/18/22 ?2120 02/19/22 ?0732  ?GLUCAP 173* 163* 98 157* 164*  ? ?Lipid Profile: ?No results for input(s): CHOL, HDL, LDLCALC, TRIG, CHOLHDL, LDLDIRECT in the last 72 hours. ?Thyroid Function Tests: ?No results for input(s): TSH, T4TOTAL, FREET4, T3FREE, THYROIDAB in the last 72 hours. ?Anemia Panel: ?No results for input(s):  VITAMINB12, FOLATE, FERRITIN, TIBC, IRON, RETICCTPCT in the last 72 hours. ?Sepsis Labs: ?Recent Labs  ?Lab 02/14/22 ?2334 02/15/22 ?0615  ?LATICACIDVEN 1.4 1.4  ? ? ?Recent Results (from the past 240 hour(s))  ?Culture, blood (Routine x 2)     Status: None (Preliminary result)  ? Collection Time: 02/14/22 11:34 PM  ? Specimen: BLOOD  ?Result Value Ref Range Status  ? Specimen Description BLOOD LEFT ANTECUBITAL  Final  ? Special Requests   Final  ?  BOTTLES DRAWN AEROBIC AND ANAEROBIC Blood Culture adequate volume  ? Culture   Final  ?  NO GROWTH 4 DAYS ?Performed at [facility], [place] [address], [place], [place] [address] ?  ? Report Status PENDING  Incomplete  ?Culture, blood (Routine x 2)     Status: None (Preliminary result)  ? Collection Time: 02/14/22 11:34 PM  ? Specimen: BLOOD  ?Result Value Ref Range Status  ? Specimen Description BLOOD RIGHT ANTECUBITAL  Final  ? Special Requests   Final  ?  BOTTLES DRAWN AEROBIC AND ANAEROBIC Blood  Culture adequate volume  ? Culture   Final  ?  NO GROWTH 4 DAYS ?Performed at [facility], [place] [address] [place], [place] [address] ?  ? Report Status PENDING  Incomplete  ?MRSA Next Gen by PCR, Nasal     Status: None  ? Collection Time: 02/15/22  6:57 AM  ? Specimen: Panniculus; Nasal Swab  ?Result Value Ref Range Status  ? MRSA by PCR Next Gen NOT DETECTED NOT DETECTED Final  ?  Comment: (NOTE) ?The GeneXpert MRSA Assay (FDA approved for NASAL specimens only), ?is one component of a comprehensive MRSA colonization surveillance ?program. It is not intended to diagnose MRSA infection nor to guide ?or monitor treatment for MRSA infections. ?Test performance is not FDA approved in patients less than 2 years ?old. ?Performed at [facility], [place] [address] [place], [place] ?[address] ?  ?Aerobic Culture w Gram Stain (superficial specimen)     Status: None  ? Collection Time: 02/15/22  6:57 AM  ? Specimen: Wound  ?Result Value Ref Range Status  ? Specimen  Description WOUND  Final  ? Special Requests NONE  Final  ? Gram Stain   Final  ?  RARE WBC PRESENT, PREDOMINANTLY PMN ?RARE GRAM POSITIVE COCCI IN PAIRS ?  ? Culture   Final  ?  FEW EIKENELLA CORRODENS ?FEW PASTE

## 2022-02-19 NOTE — Telephone Encounter (Signed)
I can't help with any inpatient matters unfortunately. We'll set up a hospital follow up after discharge though

## 2022-02-20 ENCOUNTER — Encounter (HOSPITAL_COMMUNITY)
Admission: EM | Disposition: A | Discharge status: Home Health | Payer: Self-pay | Source: Home / Self Care | Attending: Internal Medicine

## 2022-02-20 ENCOUNTER — Inpatient Hospital Stay (HOSPITAL_COMMUNITY): Payer: Medicare Other | Admitting: Anesthesiology

## 2022-02-20 DIAGNOSIS — D62 Acute posthemorrhagic anemia: Secondary | ICD-10-CM | POA: Diagnosis not present

## 2022-02-20 DIAGNOSIS — R1310 Dysphagia, unspecified: Secondary | ICD-10-CM

## 2022-02-20 DIAGNOSIS — I2510 Atherosclerotic heart disease of native coronary artery without angina pectoris: Secondary | ICD-10-CM

## 2022-02-20 DIAGNOSIS — E1151 Type 2 diabetes mellitus with diabetic peripheral angiopathy without gangrene: Secondary | ICD-10-CM

## 2022-02-20 DIAGNOSIS — E876 Hypokalemia: Secondary | ICD-10-CM | POA: Diagnosis not present

## 2022-02-20 DIAGNOSIS — R933 Abnormal findings on diagnostic imaging of other parts of digestive tract: Secondary | ICD-10-CM

## 2022-02-20 DIAGNOSIS — T8140XA Infection following a procedure, unspecified, initial encounter: Secondary | ICD-10-CM | POA: Diagnosis not present

## 2022-02-20 DIAGNOSIS — E871 Hypo-osmolality and hyponatremia: Secondary | ICD-10-CM | POA: Diagnosis not present

## 2022-02-20 HISTORY — PX: ESOPHAGEAL DILATION: SHX303

## 2022-02-20 HISTORY — PX: ESOPHAGOGASTRODUODENOSCOPY (EGD) WITH PROPOFOL: SHX5813

## 2022-02-20 LAB — GLUCOSE, CAPILLARY
Glucose-Capillary: 170 mg/dL — ABNORMAL HIGH (ref 70–99)
Glucose-Capillary: 147 mg/dL — ABNORMAL HIGH (ref 70–99)
Glucose-Capillary: 120 mg/dL — ABNORMAL HIGH (ref 70–99)
Glucose-Capillary: 170 mg/dL — ABNORMAL HIGH (ref 70–99)

## 2022-02-20 LAB — CULTURE, BLOOD (ROUTINE X 2)
Culture: NO GROWTH
Culture: NO GROWTH
Special Requests: ADEQUATE
Special Requests: ADEQUATE

## 2022-02-20 SURGERY — ESOPHAGOGASTRODUODENOSCOPY (EGD) WITH PROPOFOL
Anesthesia: Monitor Anesthesia Care

## 2022-02-20 MED ORDER — SODIUM CHLORIDE 0.9 % IV SOLN: INTRAVENOUS | Status: DC

## 2022-02-20 MED ORDER — PROPOFOL 500 MG/50ML IV EMUL
INTRAVENOUS | Status: DC | PRN
  Administered 2022-02-20: 100 ug/kg/min via INTRAVENOUS

## 2022-02-20 MED ORDER — PROPOFOL 10 MG/ML IV BOLUS
INTRAVENOUS | Status: DC | PRN
  Administered 2022-02-20: 30 mg via INTRAVENOUS
  Administered 2022-02-20: 20 mg via INTRAVENOUS

## 2022-02-20 MED ORDER — LIDOCAINE 2% (20 MG/ML) 5 ML SYRINGE
INTRAMUSCULAR | Status: DC | PRN
  Administered 2022-02-20: 30 mg via INTRAVENOUS

## 2022-02-20 SURGICAL SUPPLY — 15 items

## 2022-02-20 NOTE — Op Note (Signed)
[facility] ?Patient Name: [name] [name] ?Procedure Date : 02/20/2022 ?MRN: [id number] ?Attending MD: Carol Ada , MD ?Date of Birth: 09/05/1951 ?CSN: [id number] ?Age: 71 ?Admit Type: Inpatient ?Procedure:                Upper GI endoscopy ?Indications:              Dysphagia, Abnormal cine-esophagram ?Providers:                Carol Ada, MD, Glori Bickers, RN, Alphonzo Grieve  ?                          Leighton Roach, Technician ?Referring MD:              ?Medicines:                Propofol per Anesthesia ?Complications:            No immediate complications. ?Estimated Blood Loss:     Estimated blood loss was minimal. ?Procedure:                Pre-Anesthesia Assessment: ?                          - Prior to the procedure, a History and Physical  ?                          was performed, and patient medications and  ?                          allergies were reviewed. The patient's tolerance of  ?                          previous anesthesia was also reviewed. The risks  ?                          and benefits of the procedure and the sedation  ?                          options and risks were discussed with the patient.  ?                          All questions were answered, and informed consent  ?                          was obtained. Prior Anticoagulants: The patient has  ?                          taken no previous anticoagulant or antiplatelet  ?                          agents. ASA Grade Assessment: [name] - A patient with  ?                          severe systemic disease. After reviewing the risks  ?  and benefits, the patient was deemed in  ?                          satisfactory condition to undergo the procedure. ?                          - Sedation was administered by an anesthesia  ?                          professional. Deep sedation was attained. ?                          After obtaining informed consent, the endoscope was  ?                          passed under direct  vision. Throughout the  ?                          procedure, the patient's blood pressure, pulse, and  ?                          oxygen saturations were monitored continuously. The  ?                          [id number]-[id number] ([id number]) Olympus endoscope was introduced  ?                          through the mouth, and advanced to the second part  ?                          of duodenum. The upper GI endoscopy was  ?                          accomplished without difficulty. The patient  ?                          tolerated the procedure well. ?Scope In: ?Scope Out: ?Findings: ?     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 This stenosis measured 1.6 cm (inner diameter) x less  ?     than one cm (in length). The stenosis was traversed. A guidewire was  ?     placed and the scope was withdrawn. Dilation was performed with a Savary  ?     dilator with no resistance at 18 mm. The dilation site was examined  ?     following endoscope reinsertion and showed moderate mucosal disruption. ?     The stomach was normal. ?     The examined duodenum was normal. ?     The luminal inspection of the esophagus did not show any stenosis. The  ?     stenosis was unmasked at the UES when the 18 mm Savary dilation was  ?     performed. There was no evidence of any strictures at the GE junction. ?Impression:               - No endoscopic esophageal  abnormality to explain  ?                          patient's dysphagia. Esophagus dilated. Dilated. ?                          - Normal stomach. ?                          - Normal examined duodenum. ?                          - No specimens collected. ?Recommendation:           - Return patient to hospital ward for ongoing care. ?                          - Resume regular diet. ?                          - Continue present medications. ?Procedure Code(s):        --- Professional --- ?                          [phone], Esophagogastroduodenoscopy, flexible,  ?                           transoral; with insertion of guide wire followed by  ?                          passage of dilator(s) through esophagus over guide  ?                          wire ?Diagnosis Code(s):        --- Professional --- ?                          R13.10, Dysphagia, unspecified ?                          R93.3, Abnormal findings on diagnostic imaging of  ?                          other parts of digestive tract ?CPT copyright 2019 [employer]. All rights reserved. ?The codes documented in this report are preliminary and upon coder review may  ?be revised to meet current compliance requirements. ?Carol Ada, MD ?Carol Ada, MD ?02/20/2022 1:42:04 PM ?This report has been signed electronically. ?Number of Addenda: 0 ?

## 2022-02-20 NOTE — Progress Notes (Signed)
?PROGRESS NOTE ? ? ? ?[name] [name]  AYT:[id number] DOB: October 01, 1951 DOA: 02/14/2022 ?PCP: Virginia Crews, MD  ? ? ?Brief Narrative:  ?71 year old female with history of type 2 diabetes, hypertension, obesity status post liposuction and panniculectomy on 3/6 presented from home with fall, dizziness and abdominal wall discomfort. ?Liposuction and panniculectomy 3/6, seen and followed by plastic surgery, recently seen in the office and started on Keflex.  She had 2 JP drains, apparently disconnected while she fell. In the emergency room, presentation blood pressure 89/52, sodium 125, potassium 3.  Started on IV fluid resuscitation, started on antibiotics and admitted to the hospital. ? ? ?Assessment & Plan:  ?Hypovolemic hyponatremia: Improved and normalized.  Renal functions are adequate.  Recheck renal functions and electrolytes tomorrow morning. ? ?Postoperative seroma/postop infection: ?CT scan consistent with postop findings, subcutaneous air but no drainable fluid collection.  WBC count was normal.  She was on Keflex as outpatient.  ?Blood cultures negative so far. Culture from JP drain with mixed organism, [place].  MRSA swab negative.  ?Repeat CT scan 3/17, stable findings.  No drainable collection.  ?Received 5 days of IV antibiotics.  No evidence of purulence or collection, will change to Augmentin for broader spectrum coverage to treat for total 14 days.  Clinically improving. ?Right thigh JP drain site is mostly clear. ?Left side is still copiously draining. ? ?Nausea vomiting, improved.  She has significant difficulty eating and midsternal pain on swallowing.  ?Not much response to lidocaine viscus.   ?Underwent barium esophagogram, barium tablet did not pass to the lower esophagus.  Difficulty with swallowing solids. Upper GI endoscopy today.  She probably has lower esophageal stricture. ? ?Essential hypertension: Holding antihypertensives.  Adequate without  antihypertensives. ? ?Hypokalemia/hypomagnesemia/hypophosphatemia: Replaced.  Recheck tomorrow morning. ? 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  Continue. ? ?Type 2 diabetes: On metformin.  Well-controlled at home.  Holding metformin.  on sliding scale insulin.  Resume metformin on discharge. ? ?Anemia associated with acute blood loss:  ?Preop hemoglobin 11, presentation hemoglobin 9-7.8-8-8. ?Currently no indication for blood transfusion.  Remains stable.  Recheck tomorrow morning. ? ? ?DVT prophylaxis: Place and maintain sequential compression device Start: 02/16/22 0222 ?SCDs Start: 02/15/22 0338 ? ? ?Code Status: Full code ?Family Communication: None.  Patient is talking to her sister. ?Disposition Plan: Status is: Inpatient. ? ?Remains as inpatient.  Inpatient procedure planned today.  No ?  ? ? ?Consultants:  ?Plastic surgery. ?Gastroenterology. ? ?Procedures:  ?Upper GI endoscopy today. ? ?Antimicrobials:  ?Rocephin 3/15-3/16 ?Vancomycin and cefepime 3/16--- 3/18 ?Cefepime 3/18--- 3/20 ?Augmentin 3/20---- ? ? ?Subjective: ? ?Patient seen and examined in the morning rounds.  Feels weak otherwise denies any complaints.  Denies any nausea vomiting.  No difficulties with liquids but gets retrosternal spasm taking any solid food. ?Lower abdominal discomfort has mostly improved.  Bowel functions normal.  Significant drainage from the left side but no drainage from the right side now. ? ?Objective: ?Vitals:  ? 02/19/22 2024 02/20/22 0546 02/20/22 0921 02/20/22 1223  ?BP: 130/69 128/62 128/66 (!) 146/52  ?Pulse: 87 75 85 76  ?Resp: '18 17 17 16  '$ ?Temp: 98.8 ?F (37.1 ?C) 98.3 ?F (36.8 ?C) 98.5 ?F (36.9 ?C) 97.8 ?F (36.6 ?C)  ?TempSrc: Oral Oral Oral Temporal  ?SpO2: 96% 100% 99% 96%  ?Weight:      ?Height:      ? ? ?Intake/Output Summary (Last 24 hours) at 02/20/2022 1334 ?Last data filed at 02/20/2022 1334 ?Johney Maine  per 24 hour  ?Intake 500 ml  ?Output 1700 ml  ?Net -1200 ml  ? ?Filed Weights  ?  02/14/22 2226 02/15/22 1500 02/17/22 2059  ?Weight: 99.8 kg 102.6 kg 112.4 kg  ? ? ?Examination: ? ?General: Mildly anxious.  Sitting in chair.  On room air. ?Cardiovascular: S1-S2 normal.  Regular rate rhythm. ?Respiratory: Bilateral clear.  No added sounds. ?Gastrointestinal: Soft.  Mild tenderness along the incision line.  Incisions intact and dry. ?Right-sided drain site clean and dry. ?Left-sided drain site still with copious amount of thin purulent fluid, freely draining and saturating bed. ?Ext: No deformities.  No cyanosis or edema. ?Neuro: No deficits. ? ? ? ? ?Data Reviewed: I have personally reviewed following labs and imaging studies ? ?CBC: ?Recent Labs  ?Lab 02/15/22 ?0916 02/15/22 ?1551 02/16/22 ?0247 02/17/22 ?0705 02/18/22 ?0032  ?WBC 5.2 5.7 6.5 6.1 7.0  ?NEUTROABS 3.1 3.4 5.4 3.8 4.0  ?HGB 7.8* 8.6* 8.8* 8.3* 8.0*  ?HCT 24.9* 26.3* 27.6* 26.4* 26.0*  ?MCV 82.7 81.7 81.2 84.1 84.4  ?PLT 347 371 400 385 391  ? ?Basic Metabolic Panel: ?Recent Labs  ?Lab 02/15/22 ?0916 02/15/22 ?1551 02/16/22 ?0247 02/17/22 ?0705 02/18/22 ?0032  ?NA 128* 132* 131* 134* 135  ?K 3.0* 3.1* 3.1* 3.8 3.9  ?CL 92* 97* 95* 102 102  ?CO2 '24 25 23 23 26  '$ ?GLUCOSE 251* 189* 304* 231* 178*  ?BUN <5* <5* 6* 6* 6*  ?CREATININE 0.58 0.68 0.58 0.58 0.66  ?CALCIUM 7.7* 7.7* 8.0* 7.7* 7.8*  ?MG 1.7  --  2.1 2.0  --   ?PHOS 1.5*  --  2.9 2.2*  --   ? ?GFR: ?Estimated Creatinine Clearance: 87 mL/min (by C-G formula based on SCr of 0.66 mg/dL). ?Liver Function Tests: ?Recent Labs  ?Lab 02/14/22 ?2334  ?AST 11*  ?ALT 14  ?ALKPHOS 94  ?BILITOT 0.3  ?PROT 6.1*  ?ALBUMIN 1.6*  ? ?No results for input(s): LIPASE, AMYLASE in the last 168 hours. ?No results for input(s): AMMONIA in the last 168 hours. ?Coagulation Profile: ?Recent Labs  ?Lab 02/14/22 ?2334  ?INR 1.2  ? ?Cardiac Enzymes: ?No results for input(s): CKTOTAL, CKMB, CKMBINDEX, TROPONINI in the last 168 hours. ?BNP (last 3 results) ?No results for input(s): PROBNP in the last 8760  hours. ?HbA1C: ?No results for input(s): HGBA1C in the last 72 hours. ?CBG: ?Recent Labs  ?Lab 02/19/22 ?1141 02/19/22 ?1640 02/19/22 ?2026 02/20/22 ?0744 02/20/22 ?1135  ?GLUCAP 161* 149* 196* 170* 147*  ? ?Lipid Profile: ?No results for input(s): CHOL, HDL, LDLCALC, TRIG, CHOLHDL, LDLDIRECT in the last 72 hours. ?Thyroid Function Tests: ?No results for input(s): TSH, T4TOTAL, FREET4, T3FREE, THYROIDAB in the last 72 hours. ?Anemia Panel: ?No results for input(s): VITAMINB12, FOLATE, FERRITIN, TIBC, IRON, RETICCTPCT in the last 72 hours. ?Sepsis Labs: ?Recent Labs  ?Lab 02/14/22 ?2334 02/15/22 ?0615  ?LATICACIDVEN 1.4 1.4  ? ? ?Recent Results (from the past 240 hour(s))  ?Culture, blood (Routine x 2)     Status: None  ? Collection Time: 02/14/22 11:34 PM  ? Specimen: BLOOD  ?Result Value Ref Range Status  ? Specimen Description BLOOD LEFT ANTECUBITAL  Final  ? Special Requests   Final  ?  BOTTLES DRAWN AEROBIC AND ANAEROBIC Blood Culture adequate volume  ? Culture   Final  ?  NO GROWTH 5 DAYS ?Performed at [facility], [place] [address], [place], [place] [address] ?  ? Report Status 02/20/2022 FINAL  Final  ?Culture, blood (Routine x  2)     Status: None  ? Collection Time: 02/14/22 11:34 PM  ? Specimen: BLOOD  ?Result Value Ref Range Status  ? Specimen Description BLOOD RIGHT ANTECUBITAL  Final  ? Special Requests   Final  ?  BOTTLES DRAWN AEROBIC AND ANAEROBIC Blood Culture adequate volume  ? Culture   Final  ?  NO GROWTH 5 DAYS ?Performed at [facility], [place] [address] [place], [place] [address] ?  ? Report Status 02/20/2022 FINAL  Final  ?MRSA Next Gen by PCR, Nasal     Status: None  ? Collection Time: 02/15/22  6:57 AM  ? Specimen: Panniculus; Nasal Swab  ?Result Value Ref Range Status  ? MRSA by PCR Next Gen NOT DETECTED NOT DETECTED Final  ?  Comment: (NOTE) ?The GeneXpert MRSA Assay (FDA approved for NASAL specimens only), ?is one component of a comprehensive MRSA colonization  surveillance ?program. It is not intended to diagnose MRSA infection nor to guide ?or monitor treatment for MRSA infections. ?Test performance is not FDA approved in patients less than 2 years ?old. ?Performed at [facility], [place] [address] [place], [place] ?

## 2022-02-20 NOTE — Plan of Care (Signed)
?  Problem: Health Behavior/Discharge Planning: ?Goal: Ability to manage health-related needs will improve ?Outcome: Progressing ?  ?Problem: Clinical Measurements: ?Goal: Ability to maintain clinical measurements within normal limits will improve ?Outcome: Progressing ?Goal: Will remain free from infection ?Outcome: Progressing ?Goal: Diagnostic test results will improve ?Outcome: Progressing ?  ?Problem: Elimination: ?Goal: Will not experience complications related to bowel motility ?Outcome: Progressing ?Goal: Will not experience complications related to urinary retention ?Outcome: Progressing ?  ?Problem: Pain Managment: ?Goal: General experience of comfort will improve ?Outcome: Progressing ?  ?Problem: Safety: ?Goal: Ability to remain free from injury will improve ?Outcome: Progressing ?  ?Problem: Skin Integrity: ?Goal: Risk for impaired skin integrity will decrease ?Outcome: Progressing ?  ?

## 2022-02-20 NOTE — Anesthesia Preprocedure Evaluation (Signed)
Anesthesia Evaluation  ?Patient identified by MRN, date of birth, ID band ?Patient awake ? ? ? ?Reviewed: ?Allergy & Precautions, NPO status , Patient's Chart, lab work & pertinent test results ? ?Airway ?Mallampati: [name] ? ?TM Distance: <3 FB ?Neck ROM: Full ? ? ? Dental ?no notable dental hx. ? ?  ?Pulmonary ?neg pulmonary ROS, former smoker,  ?  ?Pulmonary exam normal ?breath sounds clear to auscultation ? ? ? ? ? ? Cardiovascular ?hypertension, + CAD, + Cardiac Stents, + Peripheral Vascular Disease and +CHF  ?Normal cardiovascular exam ?Rhythm:Regular Rate:Normal ? ? ?  ?Neuro/Psych ? Headaches, Anxiety Depression negative psych ROS  ? GI/Hepatic ?negative GI ROS, Neg liver ROS,   ?Endo/Other  ?diabetes, Type 2 ? Renal/GU ?negative Renal ROS  ?negative genitourinary ?  ?Musculoskeletal ? ?(+) Arthritis , Osteoarthritis,  Fibromyalgia - ? Abdominal ?(+) + obese,   ?Peds ?negative pediatric ROS ?(+)  Hematology ? ?(+) Blood dyscrasia, anemia ,   ?Anesthesia Other Findings ? ? Reproductive/Obstetrics ?negative OB ROS ? ?  ? ? ? ? ? ? ? ? ? ? ? ? ? ?  ?  ? ? ? ? ? ? ? ? ?Anesthesia Physical ? ?Anesthesia Plan ? ?ASA: 3 ? ?Anesthesia Plan: MAC  ? ?Post-op Pain Management: Minimal or no pain anticipated  ? ?Induction: Intravenous ? ?PONV Risk Score and Plan: 2 and Ondansetron, Treatment may vary due to age or medical condition and Midazolam ? ?Airway Management Planned: Nasal Cannula ? ?Additional Equipment:  ? ?Intra-op Plan:  ? ?Post-operative Plan:  ? ?Informed Consent: I have reviewed the patients History and Physical, chart, labs and discussed the procedure including the risks, benefits and alternatives for the proposed anesthesia with the patient or authorized representative who has indicated his/her understanding and acceptance.  ? ? ? ?Dental advisory given ? ?Plan Discussed with: CRNA and Surgeon ? ?Anesthesia Plan Comments: (PAT note by Karoline Caldwell, PA-C: ?Patient follows  with cardiology at [facility] for history of HFrEF (EF 50% by echo 07/2021; up from 25 to 30% 2016), CAD, s/p PCI ~2010), HTN, HLD.  Last seen by Dr. Nehemiah Massed 08/02/2021 for preop clearance.  Per note, "The patient is planning to undergo a skin removal surgery after losing 160 pounds. She is doing remarkably well, but does have a significant cardiac history including prior MI with stent placement and heart failure with reduced ejection fraction. Fortunately she has no signs or symptoms of heart failure or cardiac ischemia and claims to be able to easily do 4 METS of exertion (easily climbs a flight of stairs and mows her own grass). 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 -No stress testing is required prior to surgery given that she is easily able to complete 4 METS of exertion without symptoms. -Need to complete echocardiogram prior to surgery. This is scheduled for 4 PM today. -Patient restarted on aspirin 81 mg indefinitely. This should be continued throughout the perioperative period. -Start metoprolol XL 25 mg daily. This should be continued throughout the perioperative period -Echocardiogram is needed to further risk stratify. The patient is likely moderate at risk for a major adverse cardiac event in the perioperative period, though there are no appreciable modifiable risk factors at this time."  Patient subsequently had echo 08/02/2021 showing EF 50% which is a marked improvement from her prior echo in 2016 showing EF 25 to 30%.  RV function was normal.  There were no significant valvular abnormalities. ? ?Non-insulin-dependent DM2  well-controlled, A1c 6.8 on preop labs. ? ?Preop labs reviewed, mild anemia with hemoglobin 11.5, otherwise unremarkable. ? ?EKG 08/02/2021 (copy on chart): NSR.  Rate 79.  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  LVH with repol abnormality. ? ?TTE 08/02/2021 (Care Everywhere): ?INTERPRETATION  ?NORMAL LEFT VENTRICULAR SYSTOLIC  FUNCTION  ?NORMAL RIGHT VENTRICULAR SYSTOLIC FUNCTION  ?NO VALVULAR REGURGITATION  ?NO VALVULAR STENOSIS  ?*POOR SOUND TRANSMISSION  ?ESTIMATED LVEF 50%  ?MILDLY DILATED LEFT VENTRICLE  ? ?)  ? ? ? ? ? ? ?Anesthesia Quick Evaluation ? ?

## 2022-02-20 NOTE — Transfer of Care (Signed)
Immediate Anesthesia Transfer of Care Note ? ?Patient: [name] [name] ? ?Procedure(s) Performed: ESOPHAGOGASTRODUODENOSCOPY (EGD) WITH PROPOFOL ?ESOPHAGEAL DILATION ? ?Patient Location: Endoscopy Unit ? ?Anesthesia Type:MAC ? ?Level of Consciousness: drowsy and patient cooperative ? ?Airway & Oxygen Therapy: Patient Spontanous Breathing and Patient connected to nasal cannula oxygen ? ?Post-op Assessment: Report given to RN and Post -op Vital signs reviewed and stable ? ?Post vital signs: Reviewed and stable ? ?Last Vitals:  ?Vitals Value Taken Time  ?BP 108/49 02/20/22 1343  ?Temp    ?Pulse 76 02/20/22 1345  ?Resp 18 02/20/22 1345  ?SpO2 98 % 02/20/22 1345  ?Vitals shown include unvalidated device data. ? ?Last Pain:  ?Vitals:  ? 02/20/22 1343  ?TempSrc:   ?PainSc: 0-No pain  ?   ? ?Patients Stated Pain Goal: 0 (02/19/22 0431) ? ?Complications: No notable events documented. ?

## 2022-02-20 NOTE — Interval H&P Note (Signed)
History and Physical Interval Note: ? ?02/20/2022 ?1:11 PM ? ?[name] [name]  has presented today for surgery, with the diagnosis of Dyspahgia.  The various methods of treatment have been discussed with the patient and family. After consideration of risks, benefits and other options for treatment, the patient has consented to  Procedure(s) with comments: ?ESOPHAGOGASTRODUODENOSCOPY (EGD) WITH PROPOFOL (N/A) - Dyspahgia as a surgical intervention.  The patient's history has been reviewed, patient examined, no change in status, stable for surgery.  I have reviewed the patient's chart and labs.  Questions were answered to the patient's satisfaction.   ? ? ?Tekesha Almgren D ? ? ?

## 2022-02-20 NOTE — Anesthesia Postprocedure Evaluation (Signed)
Anesthesia Post Note ? ?Patient: [name] [name] ? ?Procedure(s) Performed: ESOPHAGOGASTRODUODENOSCOPY (EGD) WITH PROPOFOL ?ESOPHAGEAL DILATION ? ?  ? ?Patient location during evaluation: PACU ?Anesthesia Type: MAC ?Level of consciousness: awake and alert ?Pain management: pain level controlled ?Vital Signs Assessment: post-procedure vital signs reviewed and stable ?Respiratory status: spontaneous breathing, nonlabored ventilation and respiratory function stable ?Cardiovascular status: blood pressure returned to baseline and stable ?Postop Assessment: no apparent nausea or vomiting ?Anesthetic complications: no ? ? ?No notable events documented. ? ?Last Vitals:  ?Vitals:  ? 02/20/22 1352 02/20/22 1403  ?BP: (!) 106/53 (!) 115/54  ?Pulse: 78 80  ?Resp: 15 16  ?Temp:    ?SpO2: 93% 94%  ?  ?Last Pain:  ?Vitals:  ? 02/20/22 1403  ?TempSrc:   ?PainSc: 0-No pain  ? ? ?  ?  ?  ?  ?  ?  ? ?Lynda Rainwater ? ? ? ? ?

## 2022-02-21 ENCOUNTER — Encounter (HOSPITAL_COMMUNITY): Payer: Self-pay | Admitting: Gastroenterology

## 2022-02-21 ENCOUNTER — Inpatient Hospital Stay (HOSPITAL_COMMUNITY): Payer: Medicare Other

## 2022-02-21 DIAGNOSIS — D62 Acute posthemorrhagic anemia: Secondary | ICD-10-CM | POA: Diagnosis not present

## 2022-02-21 DIAGNOSIS — T8140XA Infection following a procedure, unspecified, initial encounter: Secondary | ICD-10-CM | POA: Diagnosis not present

## 2022-02-21 DIAGNOSIS — E876 Hypokalemia: Secondary | ICD-10-CM | POA: Diagnosis not present

## 2022-02-21 DIAGNOSIS — E8809 Other disorders of plasma-protein metabolism, not elsewhere classified: Secondary | ICD-10-CM | POA: Diagnosis not present

## 2022-02-21 DIAGNOSIS — K222 Esophageal obstruction: Secondary | ICD-10-CM

## 2022-02-21 LAB — CBC WITH DIFFERENTIAL/PLATELET
Abs Immature Granulocytes: 0.24 10*3/uL — ABNORMAL HIGH (ref 0.00–0.07)
Basophils Absolute: 0.1 10*3/uL (ref 0.0–0.1)
Basophils Relative: 1 %
Eosinophils Absolute: 0.3 10*3/uL (ref 0.0–0.5)
Eosinophils Relative: 3 %
HCT: 27.8 % — ABNORMAL LOW (ref 36.0–46.0)
Hemoglobin: 8.3 g/dL — ABNORMAL LOW (ref 12.0–15.0)
Immature Granulocytes: 3 %
Lymphocytes Relative: 23 %
Lymphs Abs: 1.8 10*3/uL (ref 0.7–4.0)
MCH: 25.6 pg — ABNORMAL LOW (ref 26.0–34.0)
MCHC: 29.9 g/dL — ABNORMAL LOW (ref 30.0–36.0)
MCV: 85.8 fL (ref 80.0–100.0)
Monocytes Absolute: 0.6 10*3/uL (ref 0.1–1.0)
Monocytes Relative: 8 %
Neutro Abs: 5.1 10*3/uL (ref 1.7–7.7)
Neutrophils Relative %: 62 %
Platelets: 349 10*3/uL (ref 150–400)
RBC: 3.24 MIL/uL — ABNORMAL LOW (ref 3.87–5.11)
RDW: 15.5 % (ref 11.5–15.5)
WBC: 8.1 10*3/uL (ref 4.0–10.5)
nRBC: 0 % (ref 0.0–0.2)

## 2022-02-21 LAB — MAGNESIUM: Magnesium: 1.9 mg/dL (ref 1.7–2.4)

## 2022-02-21 LAB — BASIC METABOLIC PANEL
Anion gap: 6 (ref 5–15)
BUN: 10 mg/dL (ref 8–23)
CO2: 29 mmol/L (ref 22–32)
Calcium: 8.2 mg/dL — ABNORMAL LOW (ref 8.9–10.3)
Chloride: 100 mmol/L (ref 98–111)
Creatinine, Ser: 0.58 mg/dL (ref 0.44–1.00)
GFR, Estimated: >60 mL/min (ref >60)
Glucose, Bld: 159 mg/dL — ABNORMAL HIGH (ref 70–99)
Potassium: 4 mmol/L (ref 3.5–5.1)
Sodium: 135 mmol/L (ref 135–145)

## 2022-02-21 LAB — GLUCOSE, CAPILLARY
Glucose-Capillary: 143 mg/dL — ABNORMAL HIGH (ref 70–99)
Glucose-Capillary: 156 mg/dL — ABNORMAL HIGH (ref 70–99)
Glucose-Capillary: 158 mg/dL — ABNORMAL HIGH (ref 70–99)
Glucose-Capillary: 124 mg/dL — ABNORMAL HIGH (ref 70–99)

## 2022-02-21 LAB — PHOSPHORUS: Phosphorus: 3.1 mg/dL (ref 2.5–4.6)

## 2022-02-21 MED ORDER — MORPHINE SULFATE (PF) 2 MG/ML IV SOLN: 2.0000 mg | INTRAVENOUS | Status: DC | PRN

## 2022-02-21 MED ORDER — FUROSEMIDE 10 MG/ML IJ SOLN: 20.0000 mg | Freq: Once | INTRAMUSCULAR | Status: DC

## 2022-02-21 NOTE — Progress Notes (Signed)
?PROGRESS NOTE ? ? ? ?[name] [name]  ZHG:[id number] DOB: 1951/11/15 DOA: 02/14/2022 ?PCP: Virginia Crews, MD  ? ? ?Brief Narrative:  ?71 year old female with history of type 2 diabetes, hypertension, obesity status post liposuction and panniculectomy on 3/6 presented from home with fall, dizziness and abdominal wall discomfort. ?Liposuction and panniculectomy 3/6, seen and followed by plastic surgery, recently seen in the office and started on Keflex.  She had 2 JP drains, apparently disconnected while she fell. In the emergency room, presentation blood pressure 89/52, sodium 125, potassium 3.  Started on IV fluid resuscitation, started on antibiotics and admitted to the hospital.  ? ? ?Assessment and Plan: ?* Post op infection ?CT scan consistent with postop findings, subcutaneous air but no drainable fluid collection.  WBC count was normal.  She was on Keflex as outpatient.  ?Blood cultures negative so far. Culture from JP drain with mixed organism, [place].  MRSA swab negative.  ?Repeat CT scan 3/17, stable findings.  No drainable collection.  ?Received 5 days of IV antibiotics.  No evidence of purulence or collection, will change to Augmentin for broader spectrum coverage to treat for total 14 days.  Clinically improving. ?-discussed with plastic surgery-- will get abdominal U/S ?  ? ?Stricture esophagus ?-on EGD: One benign-appearing, intrinsic mild stenosis was found at the cricopharyngeus. This stenosis measured 1.6 cm (inner diameter) x less  than one cm (in length). Dilation was performed with a Savary dilator with no resistance at 18 mm. The dilation site was examined  following endoscope reinsertion and showed moderate mucosal disruption. ? ?Hyponatremia ?Likely due to fluid losses from surgery. ?-resolved on labs ? ?Anemia associated with acute blood loss ?Post op anemia, HGB 9 down from 11. ?Monitor CBC for now, will hold off on transfusion.[name] ?No report of ongoing bleeding at this  point. ? ?Hypoalbuminemia ?-suspect will improve as patient is not eating ? ?Hypokalemia ?Hypokalemia/hypomagnesemia/hypophosphatemia: Replaced.   ? ?Chronic bilateral low back pain with bilateral sciatica ?Continue lyrica ? ?Diabetes ([place]) ?Hold metformin ?Mod scale SSI AC while here. ? ? ? ? ? ? ? ? ? ?DVT prophylaxis: Place and maintain sequential compression device Start: 02/16/22 0222 ?SCDs Start: 02/15/22 0338 ? ?  Code Status: Full Code ?Family Communication:  ? ?Disposition Plan:  ?Level of care: Med-Surg ?Status is: Inpatient ?Remains inpatient appropriate because: needs u/s ?  ? ?Consultants:  ?Plastic surgery ? ? ?Subjective: ?Says she is going to a SNF ?Large amount of fluid/drainage from right thigh ? ? ?Objective: ?Vitals:  ? 02/20/22 1403 02/20/22 1642 02/20/22 2025 02/21/22 0454  ?BP: (!) 115/54 122/69 (!) 97/59 (!) 114/56  ?Pulse: 80 84 94 79  ?Resp: '16 17 18 18  '$ ?Temp:  97.9 ?F (36.6 ?C) 98.3 ?F (36.8 ?C) 98.4 ?F (36.9 ?C)  ?TempSrc:   Oral Oral  ?SpO2: 94% 95% 100% 97%  ?Weight:      ?Height:      ? ? ?Intake/Output Summary (Last 24 hours) at 02/21/2022 1259 ?Last data filed at 02/21/2022 0453 ?Gross per 24 hour  ?Intake 400 ml  ?Output 700 ml  ?Net -300 ml  ? ?Filed Weights  ? 02/14/22 2226 02/15/22 1500 02/17/22 2059  ?Weight: 99.8 kg 102.6 kg 112.4 kg  ? ? ?Examination: ? ? ?General: Appearance:    Obese female in no acute distress  ?   ?Lungs:     respirations unlabored  ?Heart:    Normal heart rate. Normal rhythm. No murmurs, rubs, or gallops.  ?  ?  MS:   All extremities are intact.  ?  ?Neurologic:   Awake, alert, oriented x 3. No apparent focal neurological           defect.   ?  ? ? ? ?Data Reviewed: I have personally reviewed following labs and imaging studies ? ?CBC: ?Recent Labs  ?Lab 02/15/22 ?1551 02/16/22 ?0247 02/17/22 ?0705 02/18/22 ?9024 02/21/22 ?0444  ?WBC 5.7 6.5 6.1 7.0 8.1  ?NEUTROABS 3.4 5.4 3.8 4.0 5.1  ?HGB 8.6* 8.8* 8.3* 8.0* 8.3*  ?HCT 26.3* 27.6* 26.4* 26.0* 27.8*  ?MCV  81.7 81.2 84.1 84.4 85.8  ?PLT 371 400 385 391 349  ? ?Basic Metabolic Panel: ?Recent Labs  ?Lab 02/15/22 ?0916 02/15/22 ?1551 02/16/22 ?0247 02/17/22 ?0705 02/18/22 ?0973 02/21/22 ?0444  ?NA 128* 132* 131* 134* 135 135  ?K 3.0* 3.1* 3.1* 3.8 3.9 4.0  ?CL 92* 97* 95* 102 102 100  ?CO2 '24 25 23 23 26 29  '$ ?GLUCOSE 251* 189* 304* 231* 178* 159*  ?BUN <5* <5* 6* 6* 6* 10  ?CREATININE 0.58 0.68 0.58 0.58 0.66 0.58  ?CALCIUM 7.7* 7.7* 8.0* 7.7* 7.8* 8.2*  ?MG 1.7  --  2.1 2.0  --  1.9  ?PHOS 1.5*  --  2.9 2.2*  --  3.1  ? ?GFR: ?Estimated Creatinine Clearance: 87 mL/min (by C-G formula based on SCr of 0.58 mg/dL). ?Liver Function Tests: ?Recent Labs  ?Lab 02/14/22 ?2334  ?AST 11*  ?ALT 14  ?ALKPHOS 94  ?BILITOT 0.3  ?PROT 6.1*  ?ALBUMIN 1.6*  ? ?No results for input(s): LIPASE, AMYLASE in the last 168 hours. ?No results for input(s): AMMONIA in the last 168 hours. ?Coagulation Profile: ?Recent Labs  ?Lab 02/14/22 ?2334  ?INR 1.2  ? ?Cardiac Enzymes: ?No results for input(s): CKTOTAL, CKMB, CKMBINDEX, TROPONINI in the last 168 hours. ?BNP (last 3 results) ?No results for input(s): PROBNP in the last 8760 hours. ?HbA1C: ?No results for input(s): HGBA1C in the last 72 hours. ?CBG: ?Recent Labs  ?Lab 02/20/22 ?1135 02/20/22 ?1641 02/20/22 ?2047 02/21/22 ?0720 02/21/22 ?1144  ?GLUCAP 147* 120* 170* 143* 156*  ? ?Lipid Profile: ?No results for input(s): CHOL, HDL, LDLCALC, TRIG, CHOLHDL, LDLDIRECT in the last 72 hours. ?Thyroid Function Tests: ?No results for input(s): TSH, T4TOTAL, FREET4, T3FREE, THYROIDAB in the last 72 hours. ?Anemia Panel: ?No results for input(s): VITAMINB12, FOLATE, FERRITIN, TIBC, IRON, RETICCTPCT in the last 72 hours. ?Sepsis Labs: ?Recent Labs  ?Lab 02/14/22 ?2334 02/15/22 ?0615  ?LATICACIDVEN 1.4 1.4  ? ? ?Recent Results (from the past 240 hour(s))  ?Culture, blood (Routine x 2)     Status: None  ? Collection Time: 02/14/22 11:34 PM  ? Specimen: BLOOD  ?Result Value Ref Range Status  ? Specimen  Description BLOOD LEFT ANTECUBITAL  Final  ? Special Requests   Final  ?  BOTTLES DRAWN AEROBIC AND ANAEROBIC Blood Culture adequate volume  ? Culture   Final  ?  NO GROWTH 5 DAYS ?Performed at [facility], [place] [address], [place], [place] [address] ?  ? Report Status 02/20/2022 FINAL  Final  ?Culture, blood (Routine x 2)     Status: None  ? Collection Time: 02/14/22 11:34 PM  ? Specimen: BLOOD  ?Result Value Ref Range Status  ? Specimen Description BLOOD RIGHT ANTECUBITAL  Final  ? Special Requests   Final  ?  BOTTLES DRAWN AEROBIC AND ANAEROBIC Blood Culture adequate volume  ? Culture   Final  ?  NO GROWTH 5  DAYS ?Performed at [facility], [place] [address] [place], [place] [address] ?  ? Report Status 02/20/2022 FINAL  Final  ?MRSA Next Gen by PCR, Nasal     Status: None  ? Collection Time: 02/15/22  6:57 AM  ? Specimen: Panniculus; Nasal Swab  ?Result Value Ref Range Status  ? MRSA by PCR Next Gen NOT DETECTED NOT DETECTED Final  ?  Comment: (NOTE) ?The GeneXpert MRSA Assay (FDA approved for NASAL specimens only), ?is one component of a comprehensive MRSA colonization surveillance ?program. It is not intended to diagnose MRSA infection nor to guide ?or monitor treatment for MRSA infections. ?Test performance is not FDA approved in patients less than 2 years ?old. ?Performed at [facility], [place] [address], [place], [place] ?[address] ?  ?Aerobic Culture w Gram Stain (superficial specimen)     Status: None  ? Collection Time: 02/15/22  6:57 AM  ? Specimen: Wound  ?Result Value Ref Range Status  ? Specimen Description WOUND  Final  ? Special Requests NONE  Final  ? Gram Stain   Final  ?  RARE WBC PRESENT, PREDOMINANTLY PMN ?RARE GRAM POSITIVE COCCI IN PAIRS ?  ? Culture   Final  ?  FEW EIKENELLA CORRODENS ?FEW PASTEURELLA MULTOCIDA ?Usually susceptible to penicillin and other beta lactam agents,quinolones,macrolides and tetracyclines. ?Performed at [facility], [place] [address]  [place], [place] [address] ?  ? Report Status 02/17/2022 FINAL  Final  ?  ? ? ? ? ? ?Radiology Studies: ?DG ESOPHAGUS W SINGLE CM (SOL OR THIN BA) ? ?Result Date: 02/19/2022 ?CLINICAL DATA:  Patient with history of difficulty swallowing

## 2022-02-21 NOTE — Progress Notes (Signed)
Subjective: ?71 year old female status post abdominal panniculectomy on 02/05/2022. ? ?Bilateral JP drains have both come out.  She reports that she overall feels better, however still continues to feel fatigued and tired.  She specifically denies any fevers, chills, nausea, vomiting, abdominal pain.  She reports she feels as if her abdomen is much less swollen than previously. ? ?Objective: ?Vital signs in last 24 hours: ?Temp:  [97.9 ?F (36.6 ?C)-98.4 ?F (36.9 ?C)] 98.4 ?F (36.9 ?C) (03/22 0454) ?Pulse Rate:  [79-94] 79 (03/22 0454) ?Resp:  [17-18] 18 (03/22 0454) ?BP: (97-122)/(56-69) 114/56 (03/22 0454) ?SpO2:  [95 %-100 %] 97 % (03/22 0454) ?Last BM Date : 02/20/22 ? ?Intake/Output from previous day: ?03/21 0701 - 03/22 0700 ?In: 400 [P.O.:300; I.V.:100] ?Out: 700 [Urine:700] ?Intake/Output this shift: ?No intake/output data recorded. ? ?General appearance: alert, cooperative, no distress, and resting in bed ?Resp: Unlabored ?GI: Soft, nontender, umbilicus not present, no visible swelling or fluid collections noted, no fluid collection noted with palpation.  Bilateral JP drain insertion site wounds noted, appear to be healing well.  Abdominal incision is overall healing well, centrally she does have a thickened area of eschar that is approximately 2.2 x 5.4 cm.  There is no surrounding erythema or cellulitic changes.  No foul odor is noted. ?Extremities: extremities normal, atraumatic, no cyanosis or edema ?Pulses: 2+ and symmetric ? ?Lab Results:  ? ?  Latest Ref Rng & Units 02/21/2022  ?  4:44 AM 02/18/2022  ? 12:32 AM 02/17/2022  ?  7:05 AM  ?CBC  ?WBC 4.0 - 10.5 K/uL 8.1   7.0   6.1    ?Hemoglobin 12.0 - 15.0 g/dL 8.3   8.0   8.3    ?Hematocrit 36.0 - 46.0 % 27.8   26.0   26.4    ?Platelets 150 - 400 K/uL 349   391   385    ?  ?BMET ?Recent Labs  ?  02/21/22 ?0444  ?NA 135  ?K 4.0  ?CL 100  ?CO2 29  ?GLUCOSE 159*  ?BUN 10  ?CREATININE 0.58  ?CALCIUM 8.2*  ? ?PT/INR ?No results for input(s): LABPROT, INR in the  last 72 hours. ?ABG ?No results for input(s): PHART, HCO3 in the last 72 hours. ? ?Invalid input(s): PCO2, PO2 ? ?Studies/Results: ?No results found. ? ?Anti-infectives: ?Anti-infectives (From admission, onward)  ? ? Start     Dose/Rate Route Frequency Ordered Stop  ? 02/19/22 1230  amoxicillin-clavulanate (AUGMENTIN) 875-125 MG per tablet 1 tablet       ? 1 tablet Oral Every 12 hours 02/19/22 1130 03/01/22 0959  ? 02/16/22 1300  vancomycin (VANCOREADY) IVPB 1500 mg/300 mL  Status:  Discontinued       ? 1,500 mg ?150 mL/hr over 120 Minutes Intravenous Every 24 hours 02/15/22 1425 02/17/22 0936  ? 02/15/22 2200  cefTRIAXone (ROCEPHIN) 1 g in sodium chloride 0.9 % 100 mL IVPB  Status:  Discontinued       ? 1 g ?200 mL/hr over 30 Minutes Intravenous Every 24 hours 02/15/22 0344 02/15/22 1115  ? 02/15/22 1200  ceFEPIme (MAXIPIME) 2 g in sodium chloride 0.9 % 100 mL IVPB  Status:  Discontinued       ? 2 g ?200 mL/hr over 30 Minutes Intravenous Every 8 hours 02/15/22 1149 02/19/22 1130  ? 02/15/22 1200  vancomycin (VANCOCIN) 2,250 mg in sodium chloride 0.9 % 500 mL IVPB       ? 2,250 mg ?261.3 mL/hr over 120 Minutes Intravenous  Once 02/15/22 1149 02/15/22 1517  ? 02/15/22 0300  cefTRIAXone (ROCEPHIN) 1 g in sodium chloride 0.9 % 100 mL IVPB       ? 1 g ?200 mL/hr over 30 Minutes Intravenous  Once 02/15/22 0254 02/15/22 0432  ? ?  ? ? ?Assessment/Plan: ? ?71 year old female status post panniculectomy.  Bilateral JP drains are no longer present.  She does not appear to have any obvious fluid collection on exam, however this is difficult to assess due to body habitus. ? ?Plan for ultrasound with possible placement of drain prior to discharge. ? ?Abdominal wound: Recommend Vaseline and gauze dressing changes to the central panniculectomy incision abdominal wound.  Recommend changing this twice daily.  ? ?We will continue to follow, plan to reassess tomorrow. ? ? LOS: 6 days  ? ? ?Carola Rhine Timohty Renbarger, PA-C ?02/21/2022 ? ?

## 2022-02-21 NOTE — Progress Notes (Signed)
UNASSIGNED PATIENT ?Subjective: ?Seems to be doing well after EGD with dilatation she is eating solid food and has consumed most of her tray lying in front of her.  She denies having any problems at the present time. ? ?Objective: ?Vital signs in last 24 hours: ?Temp:  [97.3 ?F (36.3 ?C)-98.5 ?F (36.9 ?C)] 98.4 ?F (36.9 ?C) (03/22 0454) ?Pulse Rate:  [76-94] 79 (03/22 0454) ?Resp:  [15-18] 18 (03/22 0454) ?BP: (97-146)/(49-69) 114/56 (03/22 0454) ?SpO2:  [93 %-100 %] 97 % (03/22 0454) ?Last BM Date : 02/20/22 ? ?Intake/Output from previous day: ?03/21 0701 - 03/22 0700 ?In: 400 [P.O.:300; I.V.:100] ?Out: 700 [Urine:700] ?Intake/Output this shift: ?Total I/O ?In: -  ?Out: 450 [Urine:450] ? ?General appearance: alert, cooperative, appears stated age, and morbidly obese ?Resp: clear to auscultation bilaterally ?Cardio: regular rate and rhythm, S1, S2 normal, no murmur, click, rub or gallop ?GI: soft, non-tender; bowel sounds normal; no masses,  no organomegaly; healing scar from recent liposuction ? ?Lab Results: ?Recent Labs  ?  02/21/22 ?7124  ?WBC 8.1  ?HGB 8.3*  ?HCT 27.8*  ?PLT 349  ? ?BMET ?Recent Labs  ?  02/21/22 ?0444  ?NA 135  ?K 4.0  ?CL 100  ?CO2 29  ?GLUCOSE 159*  ?BUN 10  ?CREATININE 0.58  ?CALCIUM 8.2*  ? ?Studies/Results: ?DG ESOPHAGUS W SINGLE CM (SOL OR THIN BA) ? ?Result Date: 02/19/2022 ?CLINICAL DATA:  Patient with history of difficulty swallowing x2 days. Request is for esophagram. EXAM: ESOPHAGUS/BARIUM SWALLOW/TABLET STUDY TECHNIQUE: Single contrast examination was performed using thin liquid barium. This exam was performed by Rushie Nyhan, Nurse Practitioner, and was supervised and interpreted by Lorin Picket MD. FLUOROSCOPY: Radiation Exposure Index (as provided by the fluoroscopic device): 9.2 mGy Kerma COMPARISON:  NONE. FINDINGS: Swallowing: Not assessed due to patient condition. Pharynx: Not assessed due to patient condition. Esophagus: Normal appearance. Esophageal motility:  Dysmotility noted. Hiatal Hernia: None. Gastroesophageal reflux: Moderate to severe reflux noted. Ingested 54m barium tablet: Became stuck at the GE junction. Other: None. IMPRESSION: 1. Limited exam due to patient condition. Gastroesophageal junction distends poorly and a 13 mm barium tablet would not pass beyond the distal esophagus, raising suspicion for a stricture. 2. Esophageal dysmotility and gastroesophageal reflux. Electronically Signed   By: MLorin PicketM.D.   On: 02/19/2022 15:29   ? ?Medications: I have reviewed the patient's current medications. ?Prior to Admission:  ?Medications Prior to Admission  ?Medication Sig Dispense Refill Last Dose  ? acetaminophen (TYLENOL) 325 MG tablet Take 650 mg by mouth every 6 (six) hours as needed for moderate pain.   Past Week  ? Biotin w/ Vitamins C & E (HAIR/SKIN/NAILS PO) Take 1 tablet by mouth daily.   02/14/2022  ? buPROPion (WELLBUTRIN XL) 300 MG 24 hr tablet TAKE 1 TABLET BY MOUTH EVERY DAY (Patient taking differently: Take 300 mg by mouth daily.) 90 tablet 1 02/14/2022  ? carvedilol (COREG) 6.25 MG tablet Take 1 tablet (6.25 mg total) by mouth 2 (two) times daily with a meal. 60 tablet 3 02/14/2022 at 1100  ? [EXPIRED] cephALEXin (KEFLEX) 500 MG capsule Take 1 capsule (500 mg total) by mouth 4 (four) times daily for 5 days. 20 capsule 0 02/14/2022  ? clonazePAM (KLONOPIN) 0.5 MG tablet Take 0.5-1 tablets (0.25-0.5 mg total) by mouth 2 (two) times daily as needed for anxiety. 30 tablet 0 unk  ? DULoxetine (CYMBALTA) 60 MG capsule TAKE 1 CAPSULE BY MOUTH EVERY DAY (Patient taking differently: Take 60 mg  by mouth daily.) 90 capsule 0 02/14/2022  ? ezetimibe (ZETIA) 10 MG tablet TAKE 1 TABLET BY MOUTH EVERY DAY (Patient taking differently: Take 10 mg by mouth daily.) 90 tablet 3 02/14/2022  ? losartan (COZAAR) 25 MG tablet TAKE 1 TABLET (25 MG TOTAL) BY MOUTH DAILY. 90 tablet 0 02/14/2022  ? metFORMIN (GLUCOPHAGE) 500 MG tablet TAKE 1 TABLET BY MOUTH 2 TIMES DAILY  WITH A MEAL. (Patient taking differently: Take 500 mg by mouth 2 (two) times daily with a meal.) 180 tablet 1 02/14/2022  ? metoprolol succinate (TOPROL-XL) 25 MG 24 hr tablet Take 25 mg by mouth daily.   02/14/2022  ? ondansetron (ZOFRAN-ODT) 4 MG disintegrating tablet Take 1 tablet (4 mg total) by mouth every 8 (eight) hours as needed for nausea or vomiting. 20 tablet 0 Past Month  ? pregabalin (LYRICA) 75 MG capsule Take 1 capsule (75 mg total) by mouth 3 (three) times daily. 90 capsule 1 02/14/2022  ? tolterodine (DETROL LA) 4 MG 24 hr capsule Take 1 capsule (4 mg total) by mouth daily. 90 capsule 3 02/14/2022  ? Vibegron (GEMTESA) 75 MG TABS Take 75 mg by mouth daily. 90 tablet 3 02/14/2022  ? ?Scheduled: ? amoxicillin-clavulanate  1 tablet Oral Q12H  ? buPROPion  300 mg Oral Daily  ? DULoxetine  60 mg Oral Daily  ? famotidine  20 mg Oral BID  ? fesoterodine  8 mg Oral Daily  ? insulin aspart  0-15 Units Subcutaneous TID WC  ? lidocaine  15 mL Mouth/Throat Q4H  ? polyethylene glycol  17 g Oral Daily  ? pregabalin  75 mg Oral TID  ? ?Continuous: ? sodium chloride    ? ? 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  Symptoms improved significantly continue PPIs and avoid nonsteroidals. ?2) Postoperative eroma/infection improved.  ?3) Chronic back pain/sciatica. ?4) HTN. ?5) AODM. ?6) Morbid obesity. ?. LOS: 6 days  ? ?Jamesmichael Shadd ?02/21/2022, 6:59 AM ? ? ?

## 2022-02-21 NOTE — Hospital Course (Addendum)
71 year old female with history of type 2 diabetes, hypertension, obesity status post liposuction and panniculectomy on 3/6 presented from home with fall, dizziness and abdominal wall discomfort. ?Liposuction and panniculectomy 3/6, seen and followed by plastic surgery, recently seen in the office and started on Keflex.  She had 2 JP drains, apparently disconnected while she fell. In the emergency room, presentation blood pressure 89/52, sodium 125, potassium 3.  Started on IV fluid resuscitation, started on antibiotics and admitted to the hospital.  Improved and plan is for home in AM with home health.   ?

## 2022-02-21 NOTE — Progress Notes (Signed)
Physical Therapy Treatment ?Patient Details ?Name: [name] [name] ?MRN: [id number] ?DOB: 05/17/1951 ?Today's Date: 02/21/2022 ? ? ?History of Present Illness 70 y/o female presented to ED on 02/14/22 following fall where she felt lightheaded and hit head. PMH: HTN, DM2, obesity s/p liposuction and panniculectomy on 02/05/22, CHF, uterine cancer ? ?  ?PT Comments  ? ? Patient progressing well towards physical therapy goals. Patient able to progress ambulation distance to 200' this session with use of RW for support and stability. Patient aware of forward flexed posture during mobility and able to correct as needed. Encouraged continued mobility with nursing staff to improve activity tolerance as patient was independent and living alone PTA. D/c plan remains appropriate.  ?   ?Recommendations for follow up therapy are one component of a multi-disciplinary discharge planning process, led by the attending physician.  Recommendations may be updated based on patient status, additional functional criteria and insurance authorization. ? ?Follow Up Recommendations ? Home health PT ?  ?  ?Assistance Recommended at Discharge Intermittent Supervision/Assistance  ?Patient can return home with the following A little help with walking and/or transfers;A little help with bathing/dressing/bathroom;Assistance with cooking/housework;Help with stairs or ramp for entrance;Assist for transportation ?  ?Equipment Recommendations ? Rolling Jru Pense (2 wheels)  ?  ?Recommendations for Other Services   ? ? ?  ?Precautions / Restrictions Precautions ?Precautions: Fall ?Precaution Comments: pt has lost both JP drains ?Restrictions ?Weight Bearing Restrictions: No  ?  ? ?Mobility ? Bed Mobility ?Overal bed mobility: Needs Assistance ?Bed Mobility: Rolling, Sidelying to Sit, Sit to Sidelying ?Rolling: Min guard ?Sidelying to sit: Min guard ?  ?  ?Sit to sidelying: Min guard ?General bed mobility comments: no physical assist required. Patient using  momentum for in/out of bed ?  ? ?Transfers ?Overall transfer level: Needs assistance ?Equipment used: Rolling Yanelle Sousa (2 wheels) ?Transfers: Sit to/from Stand ?Sit to Stand: Min guard ?  ?  ?  ?  ?  ?General transfer comment: increased time and effort to complete ?  ? ?Ambulation/Gait ?Ambulation/Gait assistance: Supervision ?Gait Distance (Feet): 200 Feet ?Assistive device: Rolling Pamalee Marcoe (2 wheels) ?Gait Pattern/deviations: Step-through pattern, Decreased stride length, Wide base of support, Trunk flexed ?Gait velocity: decreased ?  ?  ?General Gait Details: mild trunk flexion due to pain but patient aware of posture and correcting self at times. supervision for safety ? ? ?Stairs ?  ?  ?  ?  ?  ? ? ?Wheelchair Mobility ?  ? ?Modified Rankin (Stroke Patients Only) ?  ? ? ?  ?Balance Overall balance assessment: Mild deficits observed, not formally tested ?  ?  ?  ?  ?  ?  ?  ?  ?  ?  ?  ?  ?  ?  ?  ?  ?  ?  ?  ? ?  ?Cognition Arousal/Alertness: Awake/alert ?Behavior During Therapy: [facility] for tasks assessed/performed ?Overall Cognitive Status: Within Functional Limits for tasks assessed ?  ?  ?  ?  ?  ?  ?  ?  ?  ?  ?  ?  ?  ?  ?  ?  ?  ?  ?  ? ?  ?Exercises   ? ?  ?General Comments   ?  ?  ? ?Pertinent Vitals/Pain Pain Assessment ?Pain Assessment: Faces ?Faces Pain Scale: Hurts a little bit ?Pain Location: abdomen ?Pain Descriptors / Indicators: Guarding ?Pain Intervention(s): Monitored during session  ? ? ?Home Living   ?  ?  ?  ?  ?  ?  ?  ?  ?  ?   ?  ?  Prior Function    ?  ?  ?   ? ?PT Goals (current goals can now be found in the care plan section) Acute Rehab PT Goals ?Patient Stated Goal: to go home ?PT Goal Formulation: With patient ?Time For Goal Achievement: 03/02/22 ?Potential to Achieve Goals: Good ?Progress towards PT goals: Progressing toward goals ? ?  ?Frequency ? ? ? Min 3X/week ? ? ? ?  ?PT Plan Current plan remains appropriate  ? ? ?Co-evaluation   ?  ?  ?  ?  ? ?  ?AM-PAC PT "6 Clicks" Mobility    ?Outcome Measure ? Help needed turning from your back to your side while in a flat bed without using bedrails?: A Little ?Help needed moving from lying on your back to sitting on the side of a flat bed without using bedrails?: A Little ?Help needed moving to and from a bed to a chair (including a wheelchair)?: A Little ?Help needed standing up from a chair using your arms (e.g., wheelchair or bedside chair)?: A Little ?Help needed to walk in hospital room?: A Little ?Help needed climbing 3-5 steps with a railing? : A Lot ?6 Click Score: 17 ? ?  ?End of Session   ?Activity Tolerance: Patient tolerated treatment well ?Patient left: in bed;with call bell/phone within reach (transport present to go to ultrasound) ?Nurse Communication: Mobility status ?PT Visit Diagnosis: Unsteadiness on feet (R26.81);Muscle weakness (generalized) (M62.81);History of falling (Z91.81);Pain ?  ? ? ?Time: 6720-9470 ?PT Time Calculation (min) (ACUTE ONLY): 26 min ? ?Charges:  $Gait Training: 23-37 mins          ?          ? ?Jacen Carlini A. Gilford Rile, PT, DPT ?Acute Rehabilitation Services ?Pager [phone] ?Office [phone] ? ? ? ?Clark Clowdus A Shauniece Kwan ?02/21/2022, 4:37 PM ? ?

## 2022-02-21 NOTE — Assessment & Plan Note (Signed)
-  on EGD: One benign-appearing, intrinsic mild stenosis was found at the cricopharyngeus. This stenosis measured 1.6 cm (inner diameter) x less  than one cm (in length). Dilation was performed with a Savary dilator with no resistance at 18 mm. The dilation site was examined  following endoscope reinsertion and showed moderate mucosal disruption. ?

## 2022-02-22 DIAGNOSIS — W19XXXA Unspecified fall, initial encounter: Secondary | ICD-10-CM

## 2022-02-22 LAB — CBC
HCT: 26.4 % — ABNORMAL LOW (ref 36.0–46.0)
Hemoglobin: 8.1 g/dL — ABNORMAL LOW (ref 12.0–15.0)
MCH: 26 pg (ref 26.0–34.0)
MCHC: 30.7 g/dL (ref 30.0–36.0)
MCV: 84.9 fL (ref 80.0–100.0)
Platelets: 335 10*3/uL (ref 150–400)
RBC: 3.11 MIL/uL — ABNORMAL LOW (ref 3.87–5.11)
RDW: 15.4 % (ref 11.5–15.5)
WBC: 7.1 10*3/uL (ref 4.0–10.5)
nRBC: 0 % (ref 0.0–0.2)

## 2022-02-22 LAB — COMPREHENSIVE METABOLIC PANEL
ALT: 9 U/L (ref 0–44)
AST: 11 U/L — ABNORMAL LOW (ref 15–41)
Albumin: 1.6 g/dL — ABNORMAL LOW (ref 3.5–5.0)
Alkaline Phosphatase: 56 U/L (ref 38–126)
Anion gap: 6 (ref 5–15)
BUN: 10 mg/dL (ref 8–23)
CO2: 27 mmol/L (ref 22–32)
Calcium: 8.1 mg/dL — ABNORMAL LOW (ref 8.9–10.3)
Chloride: 103 mmol/L (ref 98–111)
Creatinine, Ser: 0.72 mg/dL (ref 0.44–1.00)
GFR, Estimated: >60 mL/min (ref >60)
Glucose, Bld: 177 mg/dL — ABNORMAL HIGH (ref 70–99)
Potassium: 4 mmol/L (ref 3.5–5.1)
Sodium: 136 mmol/L (ref 135–145)
Total Bilirubin: 0.3 mg/dL (ref 0.3–1.2)
Total Protein: 5.5 g/dL — ABNORMAL LOW (ref 6.5–8.1)

## 2022-02-22 LAB — GLUCOSE, CAPILLARY
Glucose-Capillary: 146 mg/dL — ABNORMAL HIGH (ref 70–99)
Glucose-Capillary: 162 mg/dL — ABNORMAL HIGH (ref 70–99)
Glucose-Capillary: 129 mg/dL — ABNORMAL HIGH (ref 70–99)
Glucose-Capillary: 176 mg/dL — ABNORMAL HIGH (ref 70–99)

## 2022-02-22 NOTE — TOC Progression Note (Signed)
Transition of Care (TOC) - Initial/Assessment Note  ? ? ?Patient Details  ?Name: [name] [name] ?MRN: [id number] ?Date of Birth: 1951-08-24 ? ?Transition of Care (TOC) CM/SW Contact:    ?Paulene Floor Gwenivere Hiraldo, LCSWA ?Phone Number: ?02/22/2022, 11:58 AM ? ?Clinical Narrative:                 ?CSW was notified by floor RN that the patient's family requested a call to discuss discharge plans.  CSW called the patient's sister, [name]  There was no answer.  CSW left a VM requesting a returned call.   ? ?Expected Discharge Plan: [place] ?Barriers to Discharge: Continued Medical Work up ? ? ?Patient Goals and CMS Choice ?Patient states their goals for this hospitalization and ongoing recovery are:: To return home ?CMS Medicare.gov Compare Post Acute Care list provided to:: Patient ?Choice offered to / list presented to : Patient ? ?Expected Discharge Plan and Services ?Expected Discharge Plan: [place] ?  ?Discharge Planning Services: CM Consult ?Post Acute Care Choice: Home Health ?Living arrangements for the past 2 months: [place] ?                ?DME Arranged: N/A ?DME Agency: NA ?  ?  ?  ?HH Arranged: PT ?[place] Agency: [facility] ?Date HH Agency Contacted: 02/19/22 ?Time [place]: 1320 ?Representative spoke with at [place]: Delsa Sale ? ?Prior Living Arrangements/Services ?Living arrangements for the past 2 months: [place] ?Lives with:: Self ?Patient language and need for interpreter reviewed:: Yes ?Do you feel safe going back to the place where you live?: Yes      ?Need for Family Participation in Patient Care: Yes (Comment) ?Care giver support system in place?: Yes (comment) ?Current home services: DME (Cane, walker, shower seat, grab bars in bathroom) ?Criminal Activity/Legal Involvement Pertinent to Current Situation/Hospitalization: No - Comment as needed ? ?Activities of Daily Living ?Home Assistive Devices/Equipment: CBG Meter, Dentures (specify  type), Eyeglasses, Cane (specify quad or straight), Walker (specify type) ?ADL Screening (condition at time of admission) ?Patient's cognitive ability adequate to safely complete daily activities?: Yes ?Is the patient deaf or have difficulty hearing?: No ?Does the patient have difficulty seeing, even when wearing glasses/contacts?: No ?Does the patient have difficulty concentrating, remembering, or making decisions?: No ?Patient able to express need for assistance with ADLs?: Yes ?Does the patient have difficulty dressing or bathing?: Yes ?Independently performs ADLs?: No ?Communication: Independent ?Dressing (OT): Needs assistance ?Is this a change from baseline?: Change from baseline, expected to last <3days ?Grooming: Independent ?Feeding: Independent ?Bathing: Needs assistance ?Is this a change from baseline?: Change from baseline, expected to last <3 days ?Toileting: Needs assistance ?Is this a change from baseline?: Change from baseline, expected to last <3 days ?In/Out Bed: Needs assistance ?Is this a change from baseline?: Change from baseline, expected to last <3 days ?Walks in Home: Independent with device (comment) ?Does the patient have difficulty walking or climbing stairs?: Yes ?Weakness of Legs: None ?Weakness of Arms/Hands: None ? ?Permission Sought/Granted ?Permission sought to share information with : Case Manager, Customer service manager, Family Supports ?Permission granted to share information with : Yes, Verbal Permission Granted ?   ?   ?   ?   ? ?Emotional Assessment ?Appearance:: Appears stated age ?Attitude/Demeanor/Rapport: Engaged, Gracious ?Affect (typically observed): Accepting, Appropriate, Calm, Hopeful ?Orientation: : Oriented to Self, Oriented to Place, Oriented to  Time, Oriented to Situation ?Alcohol / Substance  Use: Not Applicable ?Psych Involvement: No (comment) ? ?Admission diagnosis:  Hyponatremia [E87.1] ?Post op infection [T81.40XA] ?Patient Active Problem List  ?  Diagnosis Date Noted  ? 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 Hyponatremia 02/15/2022  ? Hypoalbuminemia 02/15/2022  ? Post op infection 02/15/2022  ?  Class: Question of  ? Anemia associated with acute blood loss 02/15/2022  ? Hypokalemia 02/15/2022  ? Hypotension 02/15/2022  ? Right arm pain 10/19/2021  ? Housing instability 03/28/2021  ? Dermatochalasis of both upper eyelids 03/23/2021  ? 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 Ptosis of both eyebrows 03/23/2021  ? Ptosis of both upper eyelids 03/23/2021  ? Senile ectropion of both lower eyelids 03/23/2021  ? Senile purpura ([place]) 03/07/2021  ? Acute anxiety 03/07/2021  ? Panniculitis 02/03/2021  ? Myalgia due to statin 12/26/2020  ? Telogen effluvium 12/26/2020  ? Candidal intertrigo 12/06/2020  ? Microcytic anemia 12/06/2020  ? Polyneuropathy associated with underlying disease ([place]) 02/03/2020  ? Lump in chest 12/28/2019  ? Grief 10/20/2019  ? Mixed stress and urge urinary incontinence 08/21/2019  ? Avitaminosis D 07/15/2019  ? Drug-induced constipation 06/12/2019  ? General weakness 07/09/2018  ? Lumbar spondylosis 07/08/2018  ? Chronic bilateral low back pain with bilateral sciatica 07/08/2018  ? Chronic pain syndrome 07/08/2018  ? Urinary incontinence 03/19/2018  ? 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 Atypical endometrial hyperplasia 08/15/2017  ? Lymphedema 01/31/2017  ? Hyperlipidemia associated with type 2 diabetes mellitus ([place]) 12/14/2016  ? Hypertension associated with diabetes ([place]) 12/14/2016  ? Chronic systolic heart failure ([place]) 10/06/2015  ? Chronic systolic CHF (congestive heart failure), NYHA class 3 ([place]) 10/06/2015  ? History of MI (myocardial infarction) 09/20/2015  ? Allergic rhinitis 07/06/2015  ? Chronic venous insufficiency 07/06/2015  ? Atherosclerosis of coronary artery 07/06/2015  ? MDD (major depressive disorder) 07/06/2015  ? Fibromyalgia 07/06/2015  ? Encounter for long-term (current) use of other medications  07/26/2014  ? Encounter for long-term (current) use of high-risk medication 07/26/2014  ? Diabetes ([place]) 04/08/2014  ? Migraines 04/08/2014  ? Gastroduodenal ulcer 04/08/2014  ? Venous stasis 04/08/2014  ? Osteoarthritis 04/08/2014  ? Coronary artery disease 04/08/2014  ? Peptic ulcer disease 04/08/2014  ? Obesity 04/08/2014  ? 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 ?PCP:  Virginia Crews, MD ?Pharmacy:   ?[id number] [place]?Phone: [phone]: [phone]? ?[facility] [address] [id number]?Phone: [phone]: [phone]? ? ? ? ?Social Determinants of Health (SDOH) Interventions ?  ? ?Readmission Risk Interventions ?   ? View : No data to display.  ?  ?  ?  ? ? ? ?

## 2022-02-22 NOTE — Progress Notes (Signed)
Subjective: ?71 year old female status post abdominal panniculectomy on 02/05/2022.  She is 2.5 weeks postop.  She reports this morning she is overall feeling well, still feeling fatigued but better.  She reports that she is going home with home health. ? ?She reports she had her ultrasound yesterday. ? ?Objective: ?Vital signs in last 24 hours: ?Temp:  [98 ?F (36.7 ?C)-98.8 ?F (37.1 ?C)] 98 ?F (36.7 ?C) (03/23 1047) ?Pulse Rate:  [80-91] 81 (03/23 1047) ?Resp:  [18-19] 18 (03/23 1047) ?BP: (103-135)/(57-80) 135/64 (03/23 1047) ?SpO2:  [95 %-99 %] 95 % (03/23 1047) ?Last BM Date : 02/21/22 ? ?Intake/Output from previous day: ?03/22 0701 - 03/23 0700 ?In: 960 [P.O.:960] ?Out: 650 [Urine:650] ?Intake/Output this shift: ?Total I/O ?In: 18 [P.O.:236] ?Out: 600 [Urine:600] ? ?General appearance: alert, cooperative, and no distress ?Head: Normocephalic, without obvious abnormality, atraumatic ?GI: Soft, nontender, umbilicus surgically removed, no visible swelling or fluid collections noted.  No fluid collections noted with palpation.  Bilateral JP drain insertion site wounds appear to be healing well, abdominal incision is overall intact and healing well.  Central thickened area of eschar still present, Xeroform gauze, ABD in place.  No surrounding erythema or cellulitic changes.  No foul odor.  No drainage noted. ?Extremities: extremities normal, atraumatic, no cyanosis or edema ?Pulses: 2+ and symmetric ? ? ?Lab Results:  ? ?  Latest Ref Rng & Units 02/22/2022  ?  1:56 AM 02/21/2022  ?  4:44 AM 02/18/2022  ? 12:32 AM  ?CBC  ?WBC 4.0 - 10.5 K/uL 7.1   8.1   7.0    ?Hemoglobin 12.0 - 15.0 g/dL 8.1   8.3   8.0    ?Hematocrit 36.0 - 46.0 % 26.4   27.8   26.0    ?Platelets 150 - 400 K/uL 335   349   391    ?  ?BMET ?Recent Labs  ?  02/21/22 ?0444 02/22/22 ?0156  ?NA 135 136  ?K 4.0 4.0  ?CL 100 103  ?CO2 29 27  ?GLUCOSE 159* 177*  ?BUN 10 10  ?CREATININE 0.58 0.72  ?CALCIUM 8.2* 8.1*  ? ?PT/INR ?No results for input(s): LABPROT,  INR in the last 72 hours. ?ABG ?No results for input(s): PHART, HCO3 in the last 72 hours. ? ?Invalid input(s): PCO2, PO2 ? ?Studies/Results: ?US Abdomen Limited ? ?Result Date: 02/21/2022 ?CLINICAL DATA:  Evaluate for seroma. Status post recent panniculectomy. EXAM: ULTRASOUND ABDOMEN LIMITED COMPARISON:  CT abdomen and pelvis 02/16/2022 FINDINGS: Images were performed of the right lower quadrant and left lower quadrant ventral abdominal wall. The patient's postoperative drainage tubes reportedly were removed today. There is mild fluid within the subcutaneous fat of the left lower quadrant of the anterior abdomen measuring up to approximately 7 x 6 x 1 mm. There is similar mild fluid within the subcutaneous fat of the right lower quadrant of the anterior abdomen measuring up to approximately 7 by 5 x 2 cm. IMPRESSION: Mild areas of fluid within the left lower quadrant and right lower quadrant anterior abdominal wall following panniculectomy surgery. These are nonspecific and may represent bland postoperative seromas. Cannot exclude the presence of infection. Electronically Signed   By: Yvonne Kendall M.D.   On: 02/21/2022 15:39   ? ?Anti-infectives: ?Anti-infectives (From admission, onward)  ? ? Start     Dose/Rate Route Frequency Ordered Stop  ? 02/19/22 1230  amoxicillin-clavulanate (AUGMENTIN) 875-125 MG per tablet 1 tablet       ? 1 tablet Oral Every 12 hours  02/19/22 1130 03/01/22 0959  ? 02/16/22 1300  vancomycin (VANCOREADY) IVPB 1500 mg/300 mL  Status:  Discontinued       ? 1,500 mg ?150 mL/hr over 120 Minutes Intravenous Every 24 hours 02/15/22 1425 02/17/22 0936  ? 02/15/22 2200  cefTRIAXone (ROCEPHIN) 1 g in sodium chloride 0.9 % 100 mL IVPB  Status:  Discontinued       ? 1 g ?200 mL/hr over 30 Minutes Intravenous Every 24 hours 02/15/22 0344 02/15/22 1115  ? 02/15/22 1200  ceFEPIme (MAXIPIME) 2 g in sodium chloride 0.9 % 100 mL IVPB  Status:  Discontinued       ? 2 g ?200 mL/hr over 30 Minutes Intravenous  Every 8 hours 02/15/22 1149 02/19/22 1130  ? 02/15/22 1200  vancomycin (VANCOCIN) 2,250 mg in sodium chloride 0.9 % 500 mL IVPB       ? 2,250 mg ?261.3 mL/hr over 120 Minutes Intravenous  Once 02/15/22 1149 02/15/22 1517  ? 02/15/22 0300  cefTRIAXone (ROCEPHIN) 1 g in sodium chloride 0.9 % 100 mL IVPB       ? 1 g ?200 mL/hr over 30 Minutes Intravenous  Once 02/15/22 0254 02/15/22 0432  ? ?  ? ? ?Assessment/Plan: ? ?71 year old female status post panniculectomy on 02/05/2022.  Currently hospitalized after a fall at home.  She reports she continues to feel fatigued, however feels much better than she did on admission.  She is not having any infectious symptoms.  She had an ultrasound yesterday which showed some small fluid collections.  Do not feel need to have drain placed or to have these fluid collections drained as they are most likely postop seromas given her history.  Her abdomen is nontender. ? ?Abdominal wound: Continue with Vaseline and gauze dressing changes to the eschar.  Recommend changing twice daily. ? ?Recommend follow-up with plastic surgery in 1 week after discharge.  All of the patient's questions were answered to her content.  Recommend calling with questions or concerns. ? ? ? ? LOS: 7 days  ? ? ?Carola Rhine Haivyn Oravec, PA-C ?02/22/2022 ? ?

## 2022-02-22 NOTE — Progress Notes (Signed)
?PROGRESS NOTE ? ? ? ?[name] [name]  YWV:[id number] DOB: Oct 19, 1951 DOA: 02/14/2022 ?PCP: Virginia Crews, MD  ? ? ?Brief Narrative:  ?71 year old female with history of type 2 diabetes, hypertension, obesity status post liposuction and panniculectomy on 3/6 presented from home with fall, dizziness and abdominal wall discomfort. ?Liposuction and panniculectomy 3/6, seen and followed by plastic surgery, recently seen in the office and started on Keflex.  She had 2 JP drains, apparently disconnected while she fell. In the emergency room, presentation blood pressure 89/52, sodium 125, potassium 3.  Started on IV fluid resuscitation, started on antibiotics and admitted to the hospital.  Improved and plan is for home in AM with home health.    ? ? ?Assessment and Plan: ?* Post op infection ?CT scan consistent with postop findings, subcutaneous air but no drainable fluid collection.  WBC count was normal.  She was on Keflex as outpatient.  ?Blood cultures negative so far. Culture from JP drain with mixed organism, [place].  MRSA swab negative.  ?Repeat CT scan 3/17, stable findings.  No drainable collection.  ?Received 5 days of IV antibiotics.  No evidence of purulence or collection, will change to Augmentin for broader spectrum coverage to treat for total 14 days.  Clinically improving. ?-seen by plastics- U/S did not show large collection but did show 2 small ?  ? ?Stricture esophagus ?-on EGD: One benign-appearing, intrinsic mild stenosis was found at the cricopharyngeus. This stenosis measured 1.6 cm (inner diameter) x less  than one cm (in length). Dilation was performed with a Savary dilator with no resistance at 18 mm. The dilation site was examined  following endoscope reinsertion and showed moderate mucosal disruption. ? ?Hyponatremia ?Likely due to fluid losses from surgery. ?-resolved on labs ? ?Anemia associated with acute blood loss ?Post op anemia, HGB 9 down from 11. ?Monitor CBC for now, will hold off  on transfusion.[name] ?No report of ongoing bleeding at this point. ? ?Hypoalbuminemia ?-suspect will improve as patient is now eating ? ?Hypokalemia ?Hypokalemia/hypomagnesemia/hypophosphatemia: Replaced.   ? ?Chronic bilateral low back pain with bilateral sciatica ?Continue lyrica ? ?Diabetes ([place]) ?Hold metformin ?Mod scale SSI AC while here. ? ? ? ? ? ? ? ? ? ?DVT prophylaxis: Place and maintain sequential compression device Start: 02/16/22 0222 ?SCDs Start: 02/15/22 0338 ? ?  Code Status: Full Code ?Family Communication:  ? ?Disposition Plan:  ?Level of care: Med-Surg ?Status is: Inpatient ?Remains inpatient appropriate because: needs u/s ?  ? ?Consultants:  ?Plastic surgery ? ? ?Subjective: ?Says she is going to a SNF ?Large amount of fluid/drainage from right thigh ? ? ?Objective: ?Vitals:  ? 02/21/22 1734 02/21/22 2106 02/22/22 0524 02/22/22 1047  ?BP: 103/80 135/72 (!) 122/57 135/64  ?Pulse: 91 87 80 81  ?Resp: '19 18 18 18  '$ ?Temp: 98.8 ?F (37.1 ?C) 98.3 ?F (36.8 ?C) 98.2 ?F (36.8 ?C) 98 ?F (36.7 ?C)  ?TempSrc:  Oral Oral Oral  ?SpO2: 99% 98% 96% 95%  ?Weight:      ?Height:      ? ? ?Intake/Output Summary (Last 24 hours) at 02/22/2022 1223 ?Last data filed at 02/22/2022 0900 ?Gross per 24 hour  ?Intake 1196 ml  ?Output 1250 ml  ?Net -54 ml  ? ?Filed Weights  ? 02/14/22 2226 02/15/22 1500 02/17/22 2059  ?Weight: 99.8 kg 102.6 kg 112.4 kg  ? ? ?Examination: ? ? ?General: Appearance:    Obese female in no acute distress  ?   ?Lungs:  respirations unlabored  ?Heart:    Normal heart rate. Normal rhythm. No murmurs, rubs, or gallops.  ?  ?MS:   All extremities are intact.  ?  ?Neurologic:   Awake, alert, oriented x 3. No apparent focal neurological           defect.   ?  ? ? ? ?Data Reviewed: I have personally reviewed following labs and imaging studies ? ?CBC: ?Recent Labs  ?Lab 02/15/22 ?1551 02/16/22 ?0247 02/17/22 ?0705 02/18/22 ?1287 02/21/22 ?8676 02/22/22 ?0156  ?WBC 5.7 6.5 6.1 7.0 8.1 7.1  ?NEUTROABS 3.4 5.4  3.8 4.0 5.1  --   ?HGB 8.6* 8.8* 8.3* 8.0* 8.3* 8.1*  ?HCT 26.3* 27.6* 26.4* 26.0* 27.8* 26.4*  ?MCV 81.7 81.2 84.1 84.4 85.8 84.9  ?PLT 371 400 385 391 349 335  ? ?Basic Metabolic Panel: ?Recent Labs  ?Lab 02/16/22 ?0247 02/17/22 ?0705 02/18/22 ?7209 02/21/22 ?4709 02/22/22 ?0156  ?NA 131* 134* 135 135 136  ?K 3.1* 3.8 3.9 4.0 4.0  ?CL 95* 102 102 100 103  ?CO2 '23 23 26 29 27  '$ ?GLUCOSE 304* 231* 178* 159* 177*  ?BUN 6* 6* 6* 10 10  ?CREATININE 0.58 0.58 0.66 0.58 0.72  ?CALCIUM 8.0* 7.7* 7.8* 8.2* 8.1*  ?MG 2.1 2.0  --  1.9  --   ?PHOS 2.9 2.2*  --  3.1  --   ? ?GFR: ?Estimated Creatinine Clearance: 87 mL/min (by C-G formula based on SCr of 0.72 mg/dL). ?Liver Function Tests: ?Recent Labs  ?Lab 02/22/22 ?0156  ?AST 11*  ?ALT 9  ?ALKPHOS 56  ?BILITOT 0.3  ?PROT 5.5*  ?ALBUMIN 1.6*  ? ?No results for input(s): LIPASE, AMYLASE in the last 168 hours. ?No results for input(s): AMMONIA in the last 168 hours. ?Coagulation Profile: ?No results for input(s): INR, PROTIME in the last 168 hours. ? ?Cardiac Enzymes: ?No results for input(s): CKTOTAL, CKMB, CKMBINDEX, TROPONINI in the last 168 hours. ?BNP (last 3 results) ?No results for input(s): PROBNP in the last 8760 hours. ?HbA1C: ?No results for input(s): HGBA1C in the last 72 hours. ?CBG: ?Recent Labs  ?Lab 02/21/22 ?0720 02/21/22 ?1144 02/21/22 ?1624 02/21/22 ?2106 02/22/22 ?6283  ?GLUCAP 143* 156* 158* 124* 146*  ? ?Lipid Profile: ?No results for input(s): CHOL, HDL, LDLCALC, TRIG, CHOLHDL, LDLDIRECT in the last 72 hours. ?Thyroid Function Tests: ?No results for input(s): TSH, T4TOTAL, FREET4, T3FREE, THYROIDAB in the last 72 hours. ?Anemia Panel: ?No results for input(s): VITAMINB12, FOLATE, FERRITIN, TIBC, IRON, RETICCTPCT in the last 72 hours. ?Sepsis Labs: ?No results for input(s): PROCALCITON, LATICACIDVEN in the last 168 hours. ? ? ?Recent Results (from the past 240 hour(s))  ?Culture, blood (Routine x 2)     Status: None  ? Collection Time: 02/14/22 11:34 PM  ?  Specimen: BLOOD  ?Result Value Ref Range Status  ? Specimen Description BLOOD LEFT ANTECUBITAL  Final  ? Special Requests   Final  ?  BOTTLES DRAWN AEROBIC AND ANAEROBIC Blood Culture adequate volume  ? Culture   Final  ?  NO GROWTH 5 DAYS ?Performed at [facility], [place] [address] [place], [place] [address] ?  ? Report Status 02/20/2022 FINAL  Final  ?Culture, blood (Routine x 2)     Status: None  ? Collection Time: 02/14/22 11:34 PM  ? Specimen: BLOOD  ?Result Value Ref Range Status  ? Specimen Description BLOOD RIGHT ANTECUBITAL  Final  ? Special Requests   Final  ?  BOTTLES DRAWN AEROBIC AND  ANAEROBIC Blood Culture adequate volume  ? Culture   Final  ?  NO GROWTH 5 DAYS ?Performed at [facility], [place] [address], [place], [place] [address] ?  ? Report Status 02/20/2022 FINAL  Final  ?MRSA Next Gen by PCR, Nasal     Status: None  ? Collection Time: 02/15/22  6:57 AM  ? Specimen: [place]; Nasal Swab  ?Result Value Ref Range Status  ? MRSA by PCR Next Gen NOT DETECTED NOT DETECTED Final  ?  Comment: (NOTE) ?The GeneXpert MRSA Assay (FDA approved for NASAL specimens only), ?is one component of a comprehensive MRSA colonization surveillance ?program. It is not intended to diagnose MRSA infection nor to guide ?or monitor treatment for MRSA infections. ?Test performance is not FDA approved in patients less than 2 years ?old. ?Performed at [facility], [place] [address] [place], [place] ?[address] ?  ?Aerobic Culture w Gram Stain (superficial specimen)     Status: None  ? Collection Time: 02/15/22  6:57 AM  ? Specimen: Wound  ?Result Value Ref Range Status  ? Specimen Description WOUND  Final  ? Special Requests NONE  Final  ? Gram Stain   Final  ?  RARE WBC PRESENT, PREDOMINANTLY PMN ?RARE GRAM POSITIVE COCCI IN PAIRS ?  ? Culture   Final  ?  FEW EIKENELLA CORRODENS ?FEW PASTEURELLA MULTOCIDA ?Usually susceptible to penicillin and other beta lactam agents,quinolones,macrolides and  tetracyclines. ?Performed at [facility], [place] [address] [place], [place] [address] ?  ? Report Status 02/17/2022 FINAL  Final  ?  ? ? ? ? ? ?Radiology Studies: ?US Abdomen Limited ? ?Result Date: 02/21/2022 ?

## 2022-02-23 ENCOUNTER — Encounter: Payer: Medicare Other | Admitting: Plastic Surgery

## 2022-02-23 LAB — BASIC METABOLIC PANEL
Anion gap: 8 (ref 5–15)
BUN: 11 mg/dL (ref 8–23)
CO2: 30 mmol/L (ref 22–32)
Calcium: 8.4 mg/dL — ABNORMAL LOW (ref 8.9–10.3)
Chloride: 98 mmol/L (ref 98–111)
Creatinine, Ser: 0.7 mg/dL (ref 0.44–1.00)
GFR, Estimated: >60 mL/min (ref >60)
Glucose, Bld: 193 mg/dL — ABNORMAL HIGH (ref 70–99)
Potassium: 4 mmol/L (ref 3.5–5.1)
Sodium: 136 mmol/L (ref 135–145)

## 2022-02-23 LAB — CBC
HCT: 25.8 % — ABNORMAL LOW (ref 36.0–46.0)
Hemoglobin: 8 g/dL — ABNORMAL LOW (ref 12.0–15.0)
MCH: 26.2 pg (ref 26.0–34.0)
MCHC: 31 g/dL (ref 30.0–36.0)
MCV: 84.6 fL (ref 80.0–100.0)
Platelets: 336 10*3/uL (ref 150–400)
RBC: 3.05 MIL/uL — ABNORMAL LOW (ref 3.87–5.11)
RDW: 15.3 % (ref 11.5–15.5)
WBC: 7.1 10*3/uL (ref 4.0–10.5)
nRBC: 0 % (ref 0.0–0.2)

## 2022-02-23 LAB — GLUCOSE, CAPILLARY
Glucose-Capillary: 154 mg/dL — ABNORMAL HIGH (ref 70–99)
Glucose-Capillary: 162 mg/dL — ABNORMAL HIGH (ref 70–99)

## 2022-02-23 MED ORDER — FAMOTIDINE 20 MG PO TABS: 20.0000 mg | ORAL_TABLET | Freq: Two times a day (BID) | ORAL | 0 refills | Status: DC

## 2022-02-23 MED ORDER — AMOXICILLIN-POT CLAVULANATE 875-125 MG PO TABS: 1.0000 | ORAL_TABLET | Freq: Two times a day (BID) | ORAL | 0 refills | Status: DC

## 2022-02-23 NOTE — Discharge Summary (Signed)
? ? ? ? ?Physician Discharge Summary  ?[name] [name] FHL:[id number] DOB: Dec 25, 1950 DOA: 02/14/2022 ? ?PCP: Virginia Crews, MD ? ?Admit date: 02/14/2022 ?Discharge date: 02/23/2022 ? ?Admitted From: home ?Discharge disposition: home ? ? ?Recommendations for Outpatient Follow-Up:  ? ?Home health ?Continue wound care ? ? ?Discharge Diagnosis:  ? ?Principal Problem: ?  Post op infection ?Active Problems: ?  Hyponatremia ?  Stricture esophagus ?  Hypoalbuminemia ?  Anemia associated with acute blood loss ?  Diabetes ([place]) ?  Hypokalemia ? ? ? ?Discharge Condition: Improved. ? ?Diet recommendation: Carbohydrate-modified. ? ?Wound care: Continue with Vaseline and gauze dressing changes to the eschar.  Recommend changing twice dailyone. ? ?Code status: Full. ? ? ?History of Present Illness:  ? ?[name] [name] is a 71 y.o. female with medical history significant of DM2, HTN, obesity s/p liposuction and panniculectomy on 02/05/22. ?  ?She presents today after a fall at home.  Patient reports that she awoke from a nap around 9:30p this evening.  While getting up, she reports losing her balance which caused her to fall onto the ground.  She denies any head trauma or loss of consciousness.  She denies any associated dizziness.  No chest pain or shortness of breath.  No recent nausea or vomiting.  Patient is having abdominal wall discomfort related to the surgery but no overt pain.  She reports that she is hydrating well, and advancing her diet slowly. ?  ?During fall: R JP drain pulled, and bulb from L JP drain disconnected. ?  ?Denies dizziness or lightheadedness. ?  ?Seen by plastic surgery in clinic on 3/13 -> pt fatigued and had fever of 101 this past weekend.  During clinic visit had to drain ~2L from abd wall JP drains. ?  ?On keflex since clinic visit. ? ? ?Hospital Course by Problem:  ? ?Post op infection ?CT scan consistent with postop findings, subcutaneous air but no drainable fluid collection.  WBC count was normal.   She was on Keflex as outpatient.  ?Blood cultures negative so far. Culture from JP drain with mixed organism, [place].  MRSA swab negative.  ?Repeat CT scan 3/17, stable findings.  No drainable collection.  ?Received 5 days of IV antibiotics.  No evidence of purulence or collection, will change to Augmentin for broader spectrum coverage to treat for total 14 days.  Clinically improving. ?-seen by plastics- no need for drain replacement ?  ?  ?Stricture esophagus ?-on EGD: One benign-appearing, intrinsic mild stenosis was found at the cricopharyngeus. This stenosis measured 1.6 cm (inner diameter) x less  than one cm (in length). Dilation was performed with a Savary dilator with no resistance at 18 mm. The dilation site was examined  following endoscope reinsertion and showed moderate mucosal disruption. ?  ?Hyponatremia ?Likely due to fluid losses from surgery. ?-resolved on labs ?  ?Anemia associated with acute blood loss ?Post op anemia, HGB 9 down from 11. ?Monitor CBC for now, will hold off on transfusion.[name] ?No report of ongoing bleeding at this point. ?  ?Hypoalbuminemia ?-suspect will improve as patient is now eating ?  ?Hypokalemia ?Hypokalemia/hypomagnesemia/hypophosphatemia: Replaced.   ?  ?Chronic bilateral low back pain with bilateral sciatica ?Continue lyrica ?  ?Diabetes ([place]) ?-resume home meds ?  ?  ? ? ? ?Medical Consultants:  ? ?Plastic surgery ?GI ? ? ?Discharge Exam:  ? ?Vitals:  ? 02/23/22 0526 02/23/22 0933  ?BP: (!) 129/56 136/60  ?Pulse: 80 83  ?Resp: 16 17  ?  Temp: 98.4 ?F (36.9 ?C) 98.9 ?F (37.2 ?C)  ?SpO2: 97% 95%  ? ?Vitals:  ? 02/22/22 1757 02/22/22 2204 02/23/22 0526 02/23/22 0933  ?BP: (!) 115/58 115/89 (!) 129/56 136/60  ?Pulse: 91 87 80 83  ?Resp: '17 18 16 17  '$ ?Temp: 99.4 ?F (37.4 ?C) 98.8 ?F (37.1 ?C) 98.4 ?F (36.9 ?C) 98.9 ?F (37.2 ?C)  ?TempSrc: Oral Oral Oral Oral  ?SpO2: 97% 95% 97% 95%  ?Weight:   109 kg   ?Height:      ? ? ?General exam: Appears calm and comfortable. ? ?The  results of significant diagnostics from this hospitalization (including imaging, microbiology, ancillary and laboratory) are listed below for reference.   ? ? ?Procedures and Diagnostic Studies:  ? ?DG Chest 2 View ? ?Result Date: 02/15/2022 ?CLINICAL DATA:  Suspected sepsis EXAM: CHEST - 2 VIEW COMPARISON:  11/05/2020 FINDINGS: The heart size and mediastinal contours are within normal limits. Both lungs are clear. The visualized skeletal structures are unremarkable. IMPRESSION: No active cardiopulmonary disease. Electronically Signed   By: Lucienne Capers M.D.   On: 02/15/2022 00:38  ? ?CT ABDOMEN PELVIS W CONTRAST ? ?Result Date: 02/15/2022 ?CLINICAL DATA:  Postoperative abdominal pain. Patient fell at home after standing. Abdominal surgery 1 week ago with recent removal of drains. EXAM: CT ABDOMEN AND PELVIS WITH CONTRAST TECHNIQUE: Multidetector CT imaging of the abdomen and pelvis was performed using the standard protocol following bolus administration of intravenous contrast. RADIATION DOSE REDUCTION: This exam was performed according to the departmental dose-optimization program which includes automated exposure control, adjustment of the mA and/or kV according to patient size and/or use of iterative reconstruction technique. CONTRAST:  158m OMNIPAQUE IOHEXOL 300 MG/ML  SOLN COMPARISON:  CT 09/05/2015 FINDINGS: Lower chest: Lung bases are clear. Hepatobiliary: Cholelithiasis with a large stone in the gallbladder. No wall thickening or inflammatory changes. No bile duct dilatation. Liver is unremarkable. Pancreas: Unremarkable. No pancreatic ductal dilatation or surrounding inflammatory changes. Spleen: Normal in size without focal abnormality. Adrenals/Urinary Tract: No adrenal gland nodules. Bilateral intrarenal stones, measuring up to 6 mm diameter. No hydronephrosis or hydroureter. Bladder is normal Stomach/Bowel: Stomach and small bowel are mostly decompressed. Gas and stool-filled colon without  significant distention. No wall thickening or inflammatory changes are appreciated. Appendix is not visualized. Vascular/Lymphatic: Aortic atherosclerosis. No enlarged abdominal or pelvic lymph nodes. Reproductive: Status post hysterectomy. No adnexal masses. Other: No free intraperitoneal air or free fluid. Soft tissue gas and stranding in the anterior abdominal wall subcutaneous fat with left sided soft tissue drain in place. Changes likely related to recent surgery. Soft tissue infection/cellulitis may co exist. Skin thickening and soft tissue infiltration throughout the anterior abdominal wall. No loculated collections are identified. Surgical clips in the groin regions. Musculoskeletal: Degenerative changes in the spine. No destructive bone lesions. IMPRESSION: 1. Soft tissue gas and stranding throughout the low anterior abdominal wall subcutaneous fat with a soft tissue drain in place. Changes are likely postoperative although superimposed infection could also be present. No loculated collections. No free intraperitoneal air. 2. Cholelithiasis without acute cholecystitis changes. 3. Nonobstructing bilateral intrarenal stones. 4. Aortic atherosclerosis. 5. Gas and stool-filled colon suggesting ileus. Electronically Signed   By: WLucienne CapersM.D.   On: 02/15/2022 01:53  ? ? ? ?Labs:  ? ?Basic Metabolic Panel: ?Recent Labs  ?Lab 02/17/22 ?0705 02/18/22 ?0676703/22/23 ?0209403/23/23 ?0709603/24/23 ?0302  ?NA 134* 135 135 136 136  ?K 3.8 3.9 4.0 4.0 4.0  ?CL 102 102  100 103 98  ?CO2 '23 26 29 27 30  '$ ?GLUCOSE 231* 178* 159* 177* 193*  ?BUN 6* 6* '10 10 11  '$ ?CREATININE 0.58 0.66 0.58 0.72 0.70  ?CALCIUM 7.7* 7.8* 8.2* 8.1* 8.4*  ?MG 2.0  --  1.9  --   --   ?PHOS 2.2*  --  3.1  --   --   ? ?GFR ?Estimated Creatinine Clearance: 85.6 mL/min (by C-G formula based on SCr of 0.7 mg/dL). ?Liver Function Tests: ?Recent Labs  ?Lab 02/22/22 ?0156  ?AST 11*  ?ALT 9  ?ALKPHOS 56  ?BILITOT 0.3  ?PROT 5.5*  ?ALBUMIN 1.6*  ? ?No  results for input(s): LIPASE, AMYLASE in the last 168 hours. ?No results for input(s): AMMONIA in the last 168 hours. ?Coagulation profile ?No results for input(s): INR, PROTIME in the last 168 hours. ? ?CBC: ?R

## 2022-02-23 NOTE — Plan of Care (Signed)
?  Problem: Elimination: Goal: Will not experience complications related to bowel motility Outcome: Progressing   Problem: Elimination: Goal: Will not experience complications related to urinary retention Outcome: Progressing   Problem: Pain Managment: Goal: General experience of comfort will improve Outcome: Progressing   

## 2022-02-23 NOTE — Progress Notes (Signed)
DISCHARGE NOTE HOME ?[name] to be discharged Home per MD order. Discussed prescriptions and follow up appointments with the patient. Prescriptions given to patient; medication list explained in detail. Patient verbalized understanding. ? ?Skin clean, dry and intact without evidence of skin break down, no evidence of skin tears noted. IV catheter discontinued intact. Site without signs and symptoms of complications. Dressing and pressure applied. Pt denies pain at the site currently. No complaints noted. ? ?Patient free of lines, drains, and wounds.  ? ?An After Visit Summary (AVS) was printed and given to the patient. ?Patient escorted via wheelchair, and discharged home via private auto. ? ?Berneta Levins, RN  ?

## 2022-02-23 NOTE — TOC Transition Note (Signed)
Transition of Care (TOC) - CM/SW Discharge Note ? ? ?Patient Details  ?Name: [name] [name] ?MRN: [id number] ?Date of Birth: 02/04/1951 ? ?Transition of Care (TOC) CM/SW Contact:  ?Tom-Johnson, Renea Ee, RN ?Phone Number: ?02/23/2022, 10:27 AM ? ? ?Clinical Narrative:    ? ?Patient is scheduled for discharge today. Home health referral with [facility]. [place] notified of discharge. RW ordered from [employer] and Freda Munro to deliver at bedside. Family to transport at discharge. No further TOC needs noted. ? ?Final next level of care: [place] ?Barriers to Discharge: Barriers Resolved ? ? ?Patient Goals and CMS Choice ?Patient states their goals for this hospitalization and ongoing recovery are:: To return home ?CMS Medicare.gov Compare Post Acute Care list provided to:: Patient ?Choice offered to / list presented to : Patient ? ?Discharge Placement ?  ?           ?  ?  ?  ?  ? ?Discharge Plan and Services ?  ?Discharge Planning Services: CM Consult ?Post Acute Care Choice: Home Health          ?DME Arranged: Walker rolling ?DME Agency: [employer] ?Date DME Agency Contacted: 02/23/22 ?Time DME Agency Contacted: [phone] ?Representative spoke with at DME Agency: Freda Munro ?HH Arranged: PT ?[place] Agency: [facility] ?Date HH Agency Contacted: 02/19/22 ?Time [place]: 1320 ?Representative spoke with at [place]: Delsa Sale ? ?Social Determinants of Health (SDOH) Interventions ?  ? ? ?Readmission Risk Interventions ?   ? View : No data to display.  ?  ?  ?  ? ? ? ? ? ?

## 2022-02-23 NOTE — Progress Notes (Signed)
Physical Therapy Treatment ?Patient Details ?Name: [name] [name] ?MRN: [id number] ?DOB: December 19, 1950 ?Today's Date: 02/23/2022 ? ? ?History of Present Illness 71 y/o female presented to ED on 02/14/22 following fall where she felt lightheaded and hit head. PMH: HTN, DM2, obesity s/p liposuction and panniculectomy on 02/05/22, CHF, uterine cancer ? ?  ?PT Comments  ? ? Patient progressing well towards physical therapy goals. Patient is more stable with mobility this session with use of RW. Eager to return home. Patient able to navigate tight room environment with RW and supervision. D/c plan remains appropriate.  ?   ?Recommendations for follow up therapy are one component of a multi-disciplinary discharge planning process, led by the attending physician.  Recommendations may be updated based on patient status, additional functional criteria and insurance authorization. ? ?Follow Up Recommendations ? Home health PT ?  ?  ?Assistance Recommended at Discharge Intermittent Supervision/Assistance  ?Patient can return home with the following A little help with walking and/or transfers;A little help with bathing/dressing/bathroom;Assistance with cooking/housework;Help with stairs or ramp for entrance;Assist for transportation ?  ?Equipment Recommendations ? Rolling Daisha Filosa (2 wheels)  ?  ?Recommendations for Other Services   ? ? ?  ?Precautions / Restrictions Precautions ?Precautions: Fall ?Restrictions ?Weight Bearing Restrictions: No  ?  ? ?Mobility ? Bed Mobility ?Overal bed mobility: Modified Independent ?  ?  ?  ?  ?  ?  ?  ?  ? ?Transfers ?Overall transfer level: Needs assistance ?Equipment used: Rolling Ganesh Deeg (2 wheels) ?Transfers: Sit to/from Stand ?Sit to Stand: Min guard, From elevated surface ?  ?  ?  ?  ?  ?General transfer comment: increased time and effort to complete ?  ? ?Ambulation/Gait ?Ambulation/Gait assistance: Supervision ?Gait Distance (Feet): 200 Feet ?Assistive device: Rolling Anaysha Andre (2 wheels) ?Gait  Pattern/deviations: Step-through pattern, Decreased stride length, Wide base of support, Trunk flexed ?Gait velocity: decreased ?  ?  ?General Gait Details: supervision for safety. mild trunk flexion due to pain/discomfort ? ? ?Stairs ?  ?  ?  ?  ?  ? ? ?Wheelchair Mobility ?  ? ?Modified Rankin (Stroke Patients Only) ?  ? ? ?  ?Balance Overall balance assessment: Needs assistance ?Sitting-balance support: No upper extremity supported, Feet supported ?Sitting balance-Leahy Scale: Good ?  ?  ?Standing balance support: Bilateral upper extremity supported ?Standing balance-Leahy Scale: Poor ?Standing balance comment: reliant on RW ?  ?  ?  ?  ?  ?  ?  ?  ?  ?  ?  ?  ? ?  ?Cognition Arousal/Alertness: Awake/alert ?Behavior During Therapy: [facility] for tasks assessed/performed ?Overall Cognitive Status: Within Functional Limits for tasks assessed ?  ?  ?  ?  ?  ?  ?  ?  ?  ?  ?  ?  ?  ?  ?  ?  ?  ?  ?  ? ?  ?Exercises   ? ?  ?General Comments   ?  ?  ? ?Pertinent Vitals/Pain Pain Assessment ?Pain Assessment: Faces ?Faces Pain Scale: Hurts a little bit ?Pain Location: abdomen ?Pain Descriptors / Indicators: Guarding ?Pain Intervention(s): Monitored during session  ? ? ?Home Living   ?  ?  ?  ?  ?  ?  ?  ?  ?  ?   ?  ?Prior Function    ?  ?  ?   ? ?PT Goals (current goals can now be found in the care plan section) Acute Rehab PT Goals ?Patient  Stated Goal: to go home ?PT Goal Formulation: With patient ?Time For Goal Achievement: 03/02/22 ?Potential to Achieve Goals: Good ?Progress towards PT goals: Progressing toward goals ? ?  ?Frequency ? ? ? Min 3X/week ? ? ? ?  ?PT Plan Current plan remains appropriate  ? ? ?Co-evaluation   ?  ?  ?  ?  ? ?  ?AM-PAC PT "6 Clicks" Mobility   ?Outcome Measure ? Help needed turning from your back to your side while in a flat bed without using bedrails?: None ?Help needed moving from lying on your back to sitting on the side of a flat bed without using bedrails?: None ?Help needed moving to and  from a bed to a chair (including a wheelchair)?: A Little ?Help needed standing up from a chair using your arms (e.g., wheelchair or bedside chair)?: A Little ?Help needed to walk in hospital room?: A Little ?Help needed climbing 3-5 steps with a railing? : A Little ?6 Click Score: 20 ? ?  ?End of Session Equipment Utilized During Treatment: Gait belt ?Activity Tolerance: Patient tolerated treatment well ?Patient left: in bed;with call bell/phone within reach (sitting EOB to eat lunch) ?Nurse Communication: Mobility status ?PT Visit Diagnosis: Unsteadiness on feet (R26.81);Muscle weakness (generalized) (M62.81);History of falling (Z91.81);Pain ?  ? ? ?Time: 9038-3338 ?PT Time Calculation (min) (ACUTE ONLY): 24 min ? ?Charges:  $Gait Training: 23-37 mins          ?          ? ?Tonjia Parillo A. Gilford Rile, PT, DPT ?Acute Rehabilitation Services ?Pager [phone] ?Office [phone] ? ? ? ?Jarely Juncaj A Elie Leppo ?02/23/2022, 1:13 PM ? ?

## 2022-02-26 ENCOUNTER — Telehealth: Payer: Self-pay

## 2022-02-26 ENCOUNTER — Other Ambulatory Visit: Payer: Self-pay | Admitting: Family Medicine

## 2022-02-26 DIAGNOSIS — Z87891 Personal history of nicotine dependence: Secondary | ICD-10-CM | POA: Diagnosis not present

## 2022-02-26 DIAGNOSIS — G8929 Other chronic pain: Secondary | ICD-10-CM | POA: Diagnosis not present

## 2022-02-26 DIAGNOSIS — E785 Hyperlipidemia, unspecified: Secondary | ICD-10-CM | POA: Diagnosis not present

## 2022-02-26 DIAGNOSIS — M1990 Unspecified osteoarthritis, unspecified site: Secondary | ICD-10-CM | POA: Diagnosis not present

## 2022-02-26 DIAGNOSIS — Z9181 History of falling: Secondary | ICD-10-CM | POA: Diagnosis not present

## 2022-02-26 DIAGNOSIS — E871 Hypo-osmolality and hyponatremia: Secondary | ICD-10-CM | POA: Diagnosis not present

## 2022-02-26 DIAGNOSIS — T8149XD Infection following a procedure, other surgical site, subsequent encounter: Secondary | ICD-10-CM | POA: Diagnosis not present

## 2022-02-26 DIAGNOSIS — M069 Rheumatoid arthritis, unspecified: Secondary | ICD-10-CM | POA: Diagnosis not present

## 2022-02-26 DIAGNOSIS — Z96652 Presence of left artificial knee joint: Secondary | ICD-10-CM | POA: Diagnosis not present

## 2022-02-26 DIAGNOSIS — M48061 Spinal stenosis, lumbar region without neurogenic claudication: Secondary | ICD-10-CM | POA: Diagnosis not present

## 2022-02-26 DIAGNOSIS — F32A Depression, unspecified: Secondary | ICD-10-CM | POA: Diagnosis not present

## 2022-02-26 DIAGNOSIS — I2510 Atherosclerotic heart disease of native coronary artery without angina pectoris: Secondary | ICD-10-CM | POA: Diagnosis not present

## 2022-02-26 DIAGNOSIS — M545 Low back pain, unspecified: Secondary | ICD-10-CM | POA: Diagnosis not present

## 2022-02-26 DIAGNOSIS — I110 Hypertensive heart disease with heart failure: Secondary | ICD-10-CM | POA: Diagnosis not present

## 2022-02-26 DIAGNOSIS — Z7984 Long term (current) use of oral hypoglycemic drugs: Secondary | ICD-10-CM | POA: Diagnosis not present

## 2022-02-26 DIAGNOSIS — E119 Type 2 diabetes mellitus without complications: Secondary | ICD-10-CM | POA: Diagnosis not present

## 2022-02-26 DIAGNOSIS — M797 Fibromyalgia: Secondary | ICD-10-CM | POA: Diagnosis not present

## 2022-02-26 DIAGNOSIS — I509 Heart failure, unspecified: Secondary | ICD-10-CM | POA: Diagnosis not present

## 2022-02-26 NOTE — Telephone Encounter (Signed)
Copied from [place] [id number]. Topic: Quick Communication - Rx Refill/Question ?>> Feb 26, 2022  1:32 PM Yvette Rack wrote: ?Medication: DULoxetine (CYMBALTA) 60 MG capsule and pregabalin (LYRICA) 75 MG capsule ? ?Has the patient contacted their pharmacy? No. Pt would like to use local pharmacy instead of mail order pharmacy ?(Agent: If no, request that the patient contact the pharmacy for the refill. If patient does not wish to contact the pharmacy document the reason why and proceed with request.) ?(Agent: If yes, when and what did the pharmacy advise?) ? ?Preferred Pharmacy (with phone number or street name): [id number] [address] ?Phone: [phone]?Fax: [phone]? ?Has the patient been seen for an appointment in the last year OR does the patient have an upcoming appointment? Yes.   ? ?Agent: Please be advised that RX refills may take up to 3 business days. We ask that you follow-up with your pharmacy. ?

## 2022-02-26 NOTE — Telephone Encounter (Signed)
Copied from [place] [id number]. Topic: Quick Communication - Home Health Verbal Orders ?>> Feb 26, 2022  1:30 PM Pawlus, Brayton Layman A wrote: ?Caller/Agency: [place] ?Callback Number: [phone] ?Hilda Blades was needing a verbal order for plan of care approval. ?

## 2022-02-26 NOTE — Telephone Encounter (Signed)
[name] [name] advising.  ?

## 2022-02-26 NOTE — Telephone Encounter (Signed)
OK for verbals 

## 2022-02-28 DIAGNOSIS — T8189XA Other complications of procedures, not elsewhere classified, initial encounter: Secondary | ICD-10-CM | POA: Diagnosis not present

## 2022-02-28 NOTE — Telephone Encounter (Signed)
Requested medication (s) are due for refill today: yes ? ?Requested medication (s) are on the active medication list: yes ? ?Last refill:  Cymbalta 10/20/21 #90 with 0 RF, Lyrica 07/25/21 #90 with 1 RF ? ?Future visit scheduled: 03/06/22 ? ?Notes to clinic:  Lyrica not delegated, Cymbalta missing information on request, please assess. ? ? ?  ? ?Requested Prescriptions  ?Pending Prescriptions Disp Refills  ? pregabalin (LYRICA) 75 MG capsule 90 capsule 1  ?  Sig: Take 1 capsule (75 mg total) by mouth 3 (three) times daily.  ?  ? Not Delegated - Neurology:  Anticonvulsants - Controlled - pregabalin Failed - 02/27/2022  8:08 AM  ?  ?  Failed - This refill cannot be delegated  ?  ?  Passed - Cr in normal range and within 360 days  ?  Creatinine  ?Date Value Ref Range Status  ?08/31/2014 0.63 0.60 - 1.30 mg/dL Final  ? ?Creatinine, Ser  ?Date Value Ref Range Status  ?02/23/2022 0.70 0.44 - 1.00 mg/dL Final  ?  ?  ?  ?  Passed - Completed PHQ-2 or PHQ-9 in the last 360 days  ?  ?  Passed - Valid encounter within last 12 months  ?  Recent Outpatient Visits   ? ?      ? 1 month ago Biceps tendinitis of right upper extremity  ? [facility] Thedore Mins, Ria Comment, PA-C  ? 4 months ago Type 2 diabetes mellitus with diabetic polyneuropathy, without long-term current use of insulin ([place])  ? [facility] [place], Dionne Bucy, MD  ? 7 months ago Type 2 diabetes mellitus with diabetic polyneuropathy, without long-term current use of insulin ([place])  ? [facility] [place], Dionne Bucy, MD  ? 10 months ago Hypertension associated with diabetes [facility])  ? [facility] [place], Dionne Bucy, MD  ? 11 months ago Housing instability  ? [facility] Bacigalupo, Dionne Bucy, MD  ? ?  ?  ?Future Appointments   ? ?        ? In 6 days Bacigalupo, Dionne Bucy, MD [facility]  ? ?  ? ?  ?  ?  ? DULoxetine (CYMBALTA) 60 MG capsule    ?  Sig: Take by mouth daily.  ?  ?  Psychiatry: Antidepressants - SNRI - duloxetine Passed - 02/27/2022  8:08 AM  ?  ?  Passed - Cr in normal range and within 360 days  ?  Creatinine  ?Date Value Ref Range Status  ?08/31/2014 0.63 0.60 - 1.30 mg/dL Final  ? ?Creatinine, Ser  ?Date Value Ref Range Status  ?02/23/2022 0.70 0.44 - 1.00 mg/dL Final  ?  ?  ?  ?  Passed - eGFR is 30 or above and within 360 days  ?  EGFR (African American)  ?Date Value Ref Range Status  ?08/31/2014 >60 >6m/min Final  ?11/05/2013 >60  Final  ? ?GFR calc Af AWyvonnia Lora ?Date Value Ref Range Status  ?12/21/2020 72 >59 mL/min/1.73 Final  ?  Comment:  ?  **In accordance with recommendations from the NKF-ASN Task force,** ?  [facility] is in the process of updating its eGFR calculation to the ?  2021 CKD-EPI creatinine equation that estimates kidney function ?  without a race variable. ?  ? ?EGFR (Non-African Amer.)  ?Date Value Ref Range Status  ?08/31/2014 >60 >652mmin Final  ?  Comment:  ?  eGFR values <6039min/1.73 m2 may be an indication of chronic ?  kidney disease (CKD). ?Calculated eGFR, using the MRDR Study equation, is useful in  ?patients with stable renal function. ?The eGFR calculation will not be reliable in acutely ill patients ?when serum creatinine is changing rapidly. It is not useful in ?patients on dialysis. The eGFR calculation may not be applicable ?to patients at the low and high extremes of body sizes, pregnant ?women, and vegetarians. ?  ?11/05/2013 >60  Final  ?  Comment:  ?  eGFR values <60m/min/1.73 m2 may be an indication of chronic ?kidney disease (CKD). ?Calculated eGFR is useful in patients with stable renal function. ?The eGFR calculation will not be reliable in acutely ill patients ?when serum creatinine is changing rapidly. It is not useful in  ?patients on dialysis. The eGFR calculation may not be applicable ?to patients at the low and high extremes of body sizes, pregnant ?women, and vegetarians. ?  ? ?GFR, Estimated  ?Date Value Ref Range Status   ?02/23/2022 >60 >60 mL/min Final  ?  Comment:  ?  (NOTE) ?Calculated using the CKD-EPI Creatinine Equation (2021) ?  ? ?GFR  ?Date Value Ref Range Status  ?12/13/2016 113.78 >60.00 mL/min Final  ? ?eGFR  ?Date Value Ref Range Status  ?10/24/2021 94 >59 mL/min/1.73 Final  ?  ?  ?  ?  Passed - Completed PHQ-2 or PHQ-9 in the last 360 days  ?  ?  Passed - Last BP in normal range  ?  BP Readings from Last 1 Encounters:  ?02/23/22 136/60  ?  ?  ?  ?  Passed - Valid encounter within last 6 months  ?  Recent Outpatient Visits   ? ?      ? 1 month ago Biceps tendinitis of right upper extremity  ? [facility]  ? 4 months ago Type 2 diabetes mellitus with diabetic polyneuropathy, [place] long[place]term [place] use of insulin [place]  ? [facility]  ? 7 months ago Type 2 diabetes mellitus with diabetic polyneuropathy, without long-term current use of insulin ([place]  ? [facility]  ? 10 months ago Hypertension associated with diabetes ([facility]  ? [facility]  ? 11 months ago Housing instability  ? [facility]  ? ?  ?  ?Future Appointments   ? ?        ? In 6 days Bacigalupo, [facility] Bucy [facility]  ? ?  ? ?  ?  ?  ? ? ?

## 2022-03-01 ENCOUNTER — Telehealth: Payer: Self-pay | Admitting: Plastic Surgery

## 2022-03-01 NOTE — Telephone Encounter (Signed)
FYI: Pt is calling in to let us know that her wound from her surgery has opened up and she is heading to the ER as soon as her rides there. ?

## 2022-03-02 ENCOUNTER — Other Ambulatory Visit: Payer: Self-pay

## 2022-03-02 ENCOUNTER — Emergency Department (HOSPITAL_COMMUNITY): Payer: Medicare Other

## 2022-03-02 ENCOUNTER — Telehealth: Payer: Self-pay

## 2022-03-02 ENCOUNTER — Inpatient Hospital Stay (HOSPITAL_COMMUNITY)
Admission: EM | Admit: 2022-03-02 | Discharge: 2022-03-06 | DRG: 902 | Disposition: A | Discharge status: Home Health | Payer: Medicare Other | Attending: Internal Medicine | Admitting: Internal Medicine

## 2022-03-02 ENCOUNTER — Encounter (HOSPITAL_COMMUNITY): Payer: Self-pay | Admitting: Emergency Medicine

## 2022-03-02 DIAGNOSIS — B9562 Methicillin resistant Staphylococcus aureus infection as the cause of diseases classified elsewhere: Secondary | ICD-10-CM | POA: Diagnosis present

## 2022-03-02 DIAGNOSIS — Z8542 Personal history of malignant neoplasm of other parts of uterus: Secondary | ICD-10-CM

## 2022-03-02 DIAGNOSIS — Z825 Family history of asthma and other chronic lower respiratory diseases: Secondary | ICD-10-CM

## 2022-03-02 DIAGNOSIS — D649 Anemia, unspecified: Secondary | ICD-10-CM | POA: Diagnosis present

## 2022-03-02 DIAGNOSIS — Z881 Allergy status to other antibiotic agents status: Secondary | ICD-10-CM | POA: Diagnosis not present

## 2022-03-02 DIAGNOSIS — Z9181 History of falling: Secondary | ICD-10-CM

## 2022-03-02 DIAGNOSIS — T8141XA Infection following a procedure, superficial incisional surgical site, initial encounter: Secondary | ICD-10-CM | POA: Diagnosis present

## 2022-03-02 DIAGNOSIS — F32A Depression, unspecified: Secondary | ICD-10-CM | POA: Diagnosis present

## 2022-03-02 DIAGNOSIS — T8149XA Infection following a procedure, other surgical site, initial encounter: Secondary | ICD-10-CM

## 2022-03-02 DIAGNOSIS — Z882 Allergy status to sulfonamides status: Secondary | ICD-10-CM

## 2022-03-02 DIAGNOSIS — R109 Unspecified abdominal pain: Secondary | ICD-10-CM | POA: Diagnosis not present

## 2022-03-02 DIAGNOSIS — Z6837 Body mass index (BMI) 37.0-37.9, adult: Secondary | ICD-10-CM

## 2022-03-02 DIAGNOSIS — M545 Low back pain, unspecified: Secondary | ICD-10-CM | POA: Diagnosis not present

## 2022-03-02 DIAGNOSIS — Z955 Presence of coronary angioplasty implant and graft: Secondary | ICD-10-CM

## 2022-03-02 DIAGNOSIS — K5900 Constipation, unspecified: Secondary | ICD-10-CM | POA: Diagnosis present

## 2022-03-02 DIAGNOSIS — I5022 Chronic systolic (congestive) heart failure: Secondary | ICD-10-CM | POA: Diagnosis not present

## 2022-03-02 DIAGNOSIS — Z8041 Family history of malignant neoplasm of ovary: Secondary | ICD-10-CM

## 2022-03-02 DIAGNOSIS — M797 Fibromyalgia: Secondary | ICD-10-CM | POA: Diagnosis not present

## 2022-03-02 DIAGNOSIS — Z8049 Family history of malignant neoplasm of other genital organs: Secondary | ICD-10-CM

## 2022-03-02 DIAGNOSIS — N200 Calculus of kidney: Secondary | ICD-10-CM | POA: Diagnosis present

## 2022-03-02 DIAGNOSIS — E119 Type 2 diabetes mellitus without complications: Secondary | ICD-10-CM | POA: Diagnosis present

## 2022-03-02 DIAGNOSIS — E538 Deficiency of other specified B group vitamins: Secondary | ICD-10-CM | POA: Diagnosis present

## 2022-03-02 DIAGNOSIS — T8130XA Disruption of wound, unspecified, initial encounter: Secondary | ICD-10-CM | POA: Diagnosis present

## 2022-03-02 DIAGNOSIS — Z91040 Latex allergy status: Secondary | ICD-10-CM

## 2022-03-02 DIAGNOSIS — Z87891 Personal history of nicotine dependence: Secondary | ICD-10-CM

## 2022-03-02 DIAGNOSIS — F419 Anxiety disorder, unspecified: Secondary | ICD-10-CM | POA: Diagnosis present

## 2022-03-02 DIAGNOSIS — I110 Hypertensive heart disease with heart failure: Secondary | ICD-10-CM | POA: Diagnosis not present

## 2022-03-02 DIAGNOSIS — Z8249 Family history of ischemic heart disease and other diseases of the circulatory system: Secondary | ICD-10-CM

## 2022-03-02 DIAGNOSIS — I2510 Atherosclerotic heart disease of native coronary artery without angina pectoris: Secondary | ICD-10-CM | POA: Diagnosis present

## 2022-03-02 DIAGNOSIS — Z803 Family history of malignant neoplasm of breast: Secondary | ICD-10-CM

## 2022-03-02 DIAGNOSIS — K8020 Calculus of gallbladder without cholecystitis without obstruction: Secondary | ICD-10-CM | POA: Diagnosis not present

## 2022-03-02 DIAGNOSIS — M1990 Unspecified osteoarthritis, unspecified site: Secondary | ICD-10-CM | POA: Diagnosis not present

## 2022-03-02 DIAGNOSIS — Z8349 Family history of other endocrine, nutritional and metabolic diseases: Secondary | ICD-10-CM

## 2022-03-02 DIAGNOSIS — A280 Pasteurellosis: Secondary | ICD-10-CM | POA: Diagnosis not present

## 2022-03-02 DIAGNOSIS — T8131XA Disruption of external operation (surgical) wound, not elsewhere classified, initial encounter: Principal | ICD-10-CM | POA: Diagnosis present

## 2022-03-02 DIAGNOSIS — E871 Hypo-osmolality and hyponatremia: Secondary | ICD-10-CM | POA: Diagnosis not present

## 2022-03-02 DIAGNOSIS — G894 Chronic pain syndrome: Secondary | ICD-10-CM | POA: Diagnosis not present

## 2022-03-02 DIAGNOSIS — Z90710 Acquired absence of both cervix and uterus: Secondary | ICD-10-CM

## 2022-03-02 DIAGNOSIS — I700 Atherosclerosis of aorta: Secondary | ICD-10-CM | POA: Diagnosis not present

## 2022-03-02 DIAGNOSIS — Z833 Family history of diabetes mellitus: Secondary | ICD-10-CM

## 2022-03-02 DIAGNOSIS — E1151 Type 2 diabetes mellitus with diabetic peripheral angiopathy without gangrene: Secondary | ICD-10-CM | POA: Diagnosis not present

## 2022-03-02 DIAGNOSIS — E118 Type 2 diabetes mellitus with unspecified complications: Secondary | ICD-10-CM | POA: Diagnosis not present

## 2022-03-02 DIAGNOSIS — L03311 Cellulitis of abdominal wall: Secondary | ICD-10-CM | POA: Diagnosis not present

## 2022-03-02 DIAGNOSIS — E785 Hyperlipidemia, unspecified: Secondary | ICD-10-CM | POA: Diagnosis not present

## 2022-03-02 DIAGNOSIS — M48061 Spinal stenosis, lumbar region without neurogenic claudication: Secondary | ICD-10-CM | POA: Diagnosis not present

## 2022-03-02 DIAGNOSIS — E1169 Type 2 diabetes mellitus with other specified complication: Secondary | ICD-10-CM | POA: Diagnosis not present

## 2022-03-02 DIAGNOSIS — D6489 Other specified anemias: Secondary | ICD-10-CM | POA: Diagnosis not present

## 2022-03-02 DIAGNOSIS — T8189XA Other complications of procedures, not elsewhere classified, initial encounter: Secondary | ICD-10-CM | POA: Diagnosis not present

## 2022-03-02 DIAGNOSIS — T8149XD Infection following a procedure, other surgical site, subsequent encounter: Secondary | ICD-10-CM | POA: Diagnosis not present

## 2022-03-02 DIAGNOSIS — I5030 Unspecified diastolic (congestive) heart failure: Secondary | ICD-10-CM | POA: Diagnosis not present

## 2022-03-02 DIAGNOSIS — I252 Old myocardial infarction: Secondary | ICD-10-CM

## 2022-03-02 DIAGNOSIS — L02211 Cutaneous abscess of abdominal wall: Secondary | ICD-10-CM | POA: Diagnosis present

## 2022-03-02 DIAGNOSIS — Z808 Family history of malignant neoplasm of other organs or systems: Secondary | ICD-10-CM

## 2022-03-02 DIAGNOSIS — Z96652 Presence of left artificial knee joint: Secondary | ICD-10-CM | POA: Diagnosis present

## 2022-03-02 DIAGNOSIS — M069 Rheumatoid arthritis, unspecified: Secondary | ICD-10-CM | POA: Diagnosis not present

## 2022-03-02 DIAGNOSIS — W19XXXA Unspecified fall, initial encounter: Secondary | ICD-10-CM | POA: Diagnosis not present

## 2022-03-02 DIAGNOSIS — I509 Heart failure, unspecified: Secondary | ICD-10-CM | POA: Diagnosis not present

## 2022-03-02 DIAGNOSIS — Z800 Family history of malignant neoplasm of digestive organs: Secondary | ICD-10-CM

## 2022-03-02 DIAGNOSIS — Z79899 Other long term (current) drug therapy: Secondary | ICD-10-CM

## 2022-03-02 DIAGNOSIS — Z7984 Long term (current) use of oral hypoglycemic drugs: Secondary | ICD-10-CM

## 2022-03-02 LAB — CBC WITH DIFFERENTIAL/PLATELET
Abs Immature Granulocytes: 0.02 10*3/uL (ref 0.00–0.07)
Basophils Absolute: 0.1 10*3/uL (ref 0.0–0.1)
Basophils Relative: 1 %
Eosinophils Absolute: 0.2 10*3/uL (ref 0.0–0.5)
Eosinophils Relative: 3 %
HCT: 31.1 % — ABNORMAL LOW (ref 36.0–46.0)
Hemoglobin: 9.5 g/dL — ABNORMAL LOW (ref 12.0–15.0)
Immature Granulocytes: 0 %
Lymphocytes Relative: 18 %
Lymphs Abs: 1.2 10*3/uL (ref 0.7–4.0)
MCH: 25.7 pg — ABNORMAL LOW (ref 26.0–34.0)
MCHC: 30.5 g/dL (ref 30.0–36.0)
MCV: 84.3 fL (ref 80.0–100.0)
Monocytes Absolute: 0.4 10*3/uL (ref 0.1–1.0)
Monocytes Relative: 6 %
Neutro Abs: 5 10*3/uL (ref 1.7–7.7)
Neutrophils Relative %: 72 %
Platelets: 498 10*3/uL — ABNORMAL HIGH (ref 150–400)
RBC: 3.69 MIL/uL — ABNORMAL LOW (ref 3.87–5.11)
RDW: 14.8 % (ref 11.5–15.5)
WBC: 6.8 10*3/uL (ref 4.0–10.5)
nRBC: 0 % (ref 0.0–0.2)

## 2022-03-02 LAB — BASIC METABOLIC PANEL
Anion gap: 12 (ref 5–15)
BUN: 15 mg/dL (ref 8–23)
CO2: 23 mmol/L (ref 22–32)
Calcium: 8.9 mg/dL (ref 8.9–10.3)
Chloride: 100 mmol/L (ref 98–111)
Creatinine, Ser: 0.77 mg/dL (ref 0.44–1.00)
GFR, Estimated: >60 mL/min (ref >60)
Glucose, Bld: 256 mg/dL — ABNORMAL HIGH (ref 70–99)
Potassium: 3.8 mmol/L (ref 3.5–5.1)
Sodium: 135 mmol/L (ref 135–145)

## 2022-03-02 LAB — URINALYSIS, ROUTINE W REFLEX MICROSCOPIC
Bilirubin Urine: NEGATIVE
Glucose, UA: NEGATIVE mg/dL
Ketones, ur: NEGATIVE mg/dL
Nitrite: NEGATIVE
Protein, ur: NEGATIVE mg/dL
Specific Gravity, Urine: 1.015 (ref 1.005–1.030)
pH: 6 (ref 5.0–8.0)

## 2022-03-02 LAB — CBG MONITORING, ED: Glucose-Capillary: 178 mg/dL — ABNORMAL HIGH (ref 70–99)

## 2022-03-02 LAB — LACTIC ACID, PLASMA
Lactic Acid, Venous: 3.1 mmol/L (ref 0.5–1.9)
Lactic Acid, Venous: 2.5 mmol/L (ref 0.5–1.9)

## 2022-03-02 MED ORDER — EZETIMIBE 10 MG PO TABS
10.0000 mg | ORAL_TABLET | Freq: Every day | ORAL | Status: DC
  Administered 2022-03-03 – 2022-03-06 (×4): 10 mg via ORAL
  Filled 2022-03-02 (×4): qty 1

## 2022-03-02 MED ORDER — BUPROPION HCL ER (XL) 150 MG PO TB24
300.0000 mg | ORAL_TABLET | Freq: Every day | ORAL | Status: DC
  Administered 2022-03-03 – 2022-03-06 (×4): 300 mg via ORAL
  Filled 2022-03-02: qty 2
  Filled 2022-03-02: qty 1
  Filled 2022-03-02: qty 2
  Filled 2022-03-02: qty 1
  Filled 2022-03-02: qty 2

## 2022-03-02 MED ORDER — VANCOMYCIN HCL 1750 MG/350ML IV SOLN
1750.0000 mg | Freq: Once | INTRAVENOUS | Status: AC
  Administered 2022-03-02: 1750 mg via INTRAVENOUS
  Filled 2022-03-02 (×2): qty 350

## 2022-03-02 MED ORDER — ONDANSETRON HCL 4 MG PO TABS: 4.0000 mg | ORAL_TABLET | Freq: Four times a day (QID) | ORAL | Status: DC | PRN

## 2022-03-02 MED ORDER — SODIUM CHLORIDE 0.9% FLUSH
3.0000 mL | Freq: Two times a day (BID) | INTRAVENOUS | Status: DC
  Administered 2022-03-02 – 2022-03-06 (×8): 3 mL via INTRAVENOUS

## 2022-03-02 MED ORDER — ACETAMINOPHEN 325 MG PO TABS
650.0000 mg | ORAL_TABLET | Freq: Four times a day (QID) | ORAL | Status: DC | PRN
  Administered 2022-03-03: 650 mg via ORAL
  Filled 2022-03-02: qty 2

## 2022-03-02 MED ORDER — POTASSIUM CHLORIDE IN NACL 20-0.9 MEQ/L-% IV SOLN
INTRAVENOUS | Status: DC
  Filled 2022-03-02 (×3): qty 1000

## 2022-03-02 MED ORDER — MORPHINE SULFATE (PF) 4 MG/ML IV SOLN: 4.0000 mg | INTRAVENOUS | Status: DC | PRN

## 2022-03-02 MED ORDER — PIPERACILLIN-TAZOBACTAM 3.375 G IVPB 30 MIN
3.3750 g | Freq: Once | INTRAVENOUS | Status: AC
  Administered 2022-03-02: 3.375 g via INTRAVENOUS
  Filled 2022-03-02: qty 50

## 2022-03-02 MED ORDER — OXYCODONE HCL 5 MG PO TABS
5.0000 mg | ORAL_TABLET | ORAL | Status: DC | PRN
  Administered 2022-03-05: 5 mg via ORAL
  Filled 2022-03-02: qty 1

## 2022-03-02 MED ORDER — METFORMIN HCL 500 MG PO TABS: 500.0000 mg | ORAL_TABLET | Freq: Two times a day (BID) | ORAL | Status: DC

## 2022-03-02 MED ORDER — PREGABALIN 75 MG PO CAPS
75.0000 mg | ORAL_CAPSULE | Freq: Three times a day (TID) | ORAL | Status: DC
  Administered 2022-03-03 – 2022-03-06 (×11): 75 mg via ORAL
  Filled 2022-03-02: qty 1
  Filled 2022-03-02: qty 3
  Filled 2022-03-02 (×9): qty 1

## 2022-03-02 MED ORDER — CARVEDILOL 6.25 MG PO TABS
6.2500 mg | ORAL_TABLET | Freq: Two times a day (BID) | ORAL | Status: DC
  Administered 2022-03-03 – 2022-03-06 (×7): 6.25 mg via ORAL
  Filled 2022-03-02 (×8): qty 1

## 2022-03-02 MED ORDER — TRAZODONE HCL 50 MG PO TABS
25.0000 mg | ORAL_TABLET | Freq: Every evening | ORAL | Status: DC | PRN
  Administered 2022-03-05: 25 mg via ORAL
  Filled 2022-03-02: qty 1

## 2022-03-02 MED ORDER — INSULIN ASPART 100 UNIT/ML IJ SOLN: 0.0000 [IU] | Freq: Every day | INTRAMUSCULAR | Status: DC

## 2022-03-02 MED ORDER — DULOXETINE HCL 60 MG PO CPEP
60.0000 mg | ORAL_CAPSULE | Freq: Every day | ORAL | Status: DC
  Administered 2022-03-03 – 2022-03-06 (×4): 60 mg via ORAL
  Filled 2022-03-02 (×4): qty 1

## 2022-03-02 MED ORDER — INSULIN ASPART 100 UNIT/ML IJ SOLN
0.0000 [IU] | Freq: Three times a day (TID) | INTRAMUSCULAR | Status: DC
  Administered 2022-03-03: 4 [IU] via SUBCUTANEOUS
  Administered 2022-03-03: 3 [IU] via SUBCUTANEOUS
  Administered 2022-03-03 – 2022-03-04 (×2): 4 [IU] via SUBCUTANEOUS
  Administered 2022-03-04: 11 [IU] via SUBCUTANEOUS
  Administered 2022-03-05 (×2): 3 [IU] via SUBCUTANEOUS
  Administered 2022-03-06: 7 [IU] via SUBCUTANEOUS

## 2022-03-02 MED ORDER — POLYETHYLENE GLYCOL 3350 17 G PO PACK: 17.0000 g | PACK | Freq: Every day | ORAL | Status: DC | PRN

## 2022-03-02 MED ORDER — ONDANSETRON HCL 4 MG/2ML IJ SOLN: 4.0000 mg | Freq: Four times a day (QID) | INTRAMUSCULAR | Status: DC | PRN

## 2022-03-02 MED ORDER — MIRABEGRON ER 25 MG PO TB24
25.0000 mg | ORAL_TABLET | Freq: Every day | ORAL | Status: DC
  Administered 2022-03-03 – 2022-03-06 (×4): 25 mg via ORAL
  Filled 2022-03-02 (×4): qty 1

## 2022-03-02 MED ORDER — PIPERACILLIN-TAZOBACTAM 3.375 G IVPB
3.3750 g | Freq: Three times a day (TID) | INTRAVENOUS | Status: DC
  Administered 2022-03-03 – 2022-03-05 (×6): 3.375 g via INTRAVENOUS
  Filled 2022-03-02 (×9): qty 50

## 2022-03-02 MED ORDER — VIBEGRON 75 MG PO TABS: 75.0000 mg | ORAL_TABLET | Freq: Every day | ORAL | Status: DC

## 2022-03-02 MED ORDER — ACETAMINOPHEN 650 MG RE SUPP: 650.0000 mg | Freq: Four times a day (QID) | RECTAL | Status: DC | PRN

## 2022-03-02 MED ORDER — VANCOMYCIN HCL 750 MG/150ML IV SOLN
750.0000 mg | Freq: Two times a day (BID) | INTRAVENOUS | Status: DC
Start: 2022-03-03 — End: 2022-03-06
  Administered 2022-03-03 – 2022-03-05 (×6): 750 mg via INTRAVENOUS
  Filled 2022-03-02 (×7): qty 150

## 2022-03-02 MED ORDER — IOHEXOL 350 MG/ML SOLN
100.0000 mL | Freq: Once | INTRAVENOUS | Status: AC | PRN
  Administered 2022-03-02: 100 mL via INTRAVENOUS

## 2022-03-02 NOTE — ED Triage Notes (Signed)
Patient here with complaint of a surgical wound on her abdomen that has opened approximately three inches, patient states the wound is from a skin removal after weight loss. Patient is afebrile in triage, alert, oriented, and in no apparent distress at this time. ?

## 2022-03-02 NOTE — Telephone Encounter (Signed)
LMTRC

## 2022-03-02 NOTE — H&P (Signed)
?History and Physical  ? ? ?[name] [name] QZR:[id number] DOB: 1951/03/12 DOA: 03/02/2022 ? ?PCP: Virginia Crews, MD  ?Patient coming from: Home ? ?I have personally briefly reviewed patient's old medical records in [place] ? ?Chief Complaint: Open surgical wound ? ?HPI: [name] [name] is a 71 y.o. female with medical history significant of diabetes mellitus x2, fibromyalgia, congestive heart failure, coronary artery disease with prior myocardial infarction, hypertension, hyperlipidemia, previous uterine cancer and recent panniculectomy for panniculitis cysts by Dr. Royce Macadamia on 02/05/22 who presents with wound dehiscence, foul purulent drainage, chills, fatigue, and abdominal discomfort.  Over the past few days she has had worsening appearance of the wound with some purulence and foul smelling drainage coming from the wound.  Called her plastic surgery team last night and then decided to come in today for evaluation.  Been having more pain in the lateral left but not as much over the wound now.  In the emergency department she went to the laboratory stood up and the area opened up and drained.  Area has looked more red and warm and feels harder and more distended prior to drainage.  He has had no new recent trauma pain is moderate and on the left side.  Photos in the media section are reviewed.   ? ?Patient reports to me that panniculectomy was necessary due to multiple episodes of panniculitis since losing 160 pounds.  She is still on her weight loss journey. ? ?ED Course: Case was reviewed with Dr. Lowry Ram from plastic surgery.  CT scan was recommended and obtained which showed no acute abscess air in the canal space as expected with recent surgery.  Vancomycin and Zosyn were recommended and the patient was referred to me for further evaluation and management. ? ?Review of Systems: As per HPI otherwise all other systems reviewed and  negative.  ? ?Past Medical History:  ?Diagnosis Date  ? Anxiety   ?  Arthritis   ? Rhumetoid arthritis  ? BRCA negative 07/2017  ? MyRisk neg  ? Cancer [facility])   ? uterine  ? Cellulitis of both lower extremities   ? Chronic  ? CHF (congestive heart failure) ([place])   ? Coronary artery disease   ? Depression   ? Diabetes mellitus without complication ([place])   ? Family history of breast cancer 07/2017  ? MyRisk neg; IBIS=10.5%/riskscore=17%  ? Family history of ovarian cancer   ? Fibromyalgia   ? Fibromyalgia affecting shoulder region   ? Headache   ? Hyperlipidemia   ? Hypertension   ? Spinal stenosis   ? ? ?Past Surgical History:  ?Procedure Laterality Date  ? ABLATION SAPHENOUS VEIN W/ RFA    ? ANAL FISSURECTOMY  01/2004  ? COLON SURGERY    ? CORONARY ANGIOPLASTY    ? CYSTOSCOPY  09/03/2017  ? Procedure: CYSTOSCOPY;  Surgeon: Gae Dry, MD;  Location: [facility] ORS;  Service: Gynecology;;  ? DILATION AND CURETTAGE OF UTERUS    ? ESOPHAGEAL DILATION  02/20/2022  ? Procedure: ESOPHAGEAL DILATION;  Surgeon: Carol Ada, MD;  Location: [place];  Service: Gastroenterology;;  savory  ? ESOPHAGOGASTRODUODENOSCOPY (EGD) WITH PROPOFOL N/A 02/20/2022  ? Procedure: ESOPHAGOGASTRODUODENOSCOPY (EGD) WITH PROPOFOL;  Surgeon: Carol Ada, MD;  Location: [place];  Service: Gastroenterology;  Laterality: N/A;  Dyspahgia  ? EYE SURGERY Bilateral 08/2020  ? eye lift  ? HERNIA REPAIR    ? LAPAROSCOPIC HYSTERECTOMY N/A 09/03/2017  ? Procedure: HYSTERECTOMY TOTAL LAPAROSCOPIC BSO;  Surgeon: Kenton Kingfisher,  Linton Ham, MD;  Location: [facility] OR[facility];  Service: Gynecology;  Laterality: N/A;  ? MEDIAL PARTIAL KNEE REPLACEMENT Left 01/06/2014  ? PANNICULECTOMY N/A 02/05/2022  ? Procedure: PANNICULECTOMY;  Surgeon: Wallace Going, DO;  Location: [place];  Service: Plastics;  Laterality: N/A;  3 hours  ? UMBILICAL HERNIA REPAIR N/A 09/05/2015  ? Procedure: HERNIA REPAIR incarcerated UMBILICAL ADULT;  Surgeon: Sherri Rad, MD;  Location: [facility];  Service: General;  Laterality: N/A;  ? Vascular Stent  11/04/2013   ? ? ?Social History  ? ?Social History Narrative  ? Not on file  ? ? ? reports that she quit smoking about 25 years ago. Her smoking use included cigarettes. She has a 29.00 pack-year smoking history. She has never used smokeless tobacco. She reports that she does not drink alcohol and does not use drugs. ? ?Allergies  ?Allergen Reactions  ? Sulfa Antibiotics Itching, Rash and Swelling  ? Latex Itching  ? Doxycycline Rash  ? Empagliflozin Itching and Other (See Comments)  ?  Itching and frequent yeast infections  ? ? ?Family History  ?Problem Relation Age of Onset  ? Hypertension Mother   ? Cataracts Mother   ? Thyroid disease Mother   ? Dementia Mother   ? COPD Father   ? Dementia Father   ? Cataracts Father   ? Aneurysm Father   ?     Abdominal & Brain  ? Diabetes Father   ? Melanoma Father   ? Breast cancer Sister 39  ? Fibromyalgia Sister   ? Migraines Sister   ? Hypertension Sister   ? Breast cancer Sister 77  ? COPD Sister   ? Ovarian cancer Sister 47  ? Migraines Brother   ? Heart attack Brother   ? Colon cancer Paternal Uncle   ?     10s  ? Colon cancer Paternal Uncle   ?     54s  ? Uterine cancer Cousin   ? Uterine cancer Cousin   ? ? ? ?Prior to Admission medications   ?Medication Sig Start Date End Date Taking? Authorizing Provider  ?acetaminophen (TYLENOL) 325 MG tablet Take 650 mg by mouth every 6 (six) hours as needed for moderate pain.   Yes [provider]  ?Biotin w/ Vitamins C & E (HAIR/SKIN/NAILS PO) Take 1 tablet by mouth daily.   Yes [provider]  ?buPROPion (WELLBUTRIN XL) 300 MG 24 hr tablet TAKE 1 TABLET BY MOUTH EVERY DAY ?Patient taking differently: Take 300 mg by mouth daily. 09/11/21  Yes Bacigalupo, Dionne Bucy, MD  ?carvedilol (COREG) 6.25 MG tablet Take 1 tablet (6.25 mg total) by mouth 2 (two) times daily with a meal. 03/07/21  Yes Bacigalupo, Dionne Bucy, MD  ?DULoxetine (CYMBALTA) 60 MG capsule TAKE 1 CAPSULE BY MOUTH EVERY DAY ?Patient taking differently: Take 60 mg  by mouth daily. 10/20/21  Yes Bacigalupo, Dionne Bucy, MD  ?ezetimibe (ZETIA) 10 MG tablet TAKE 1 TABLET BY MOUTH EVERY DAY ?Patient taking differently: Take 10 mg by mouth daily. 12/16/21  Yes Bacigalupo, Dionne Bucy, MD  ?metFORMIN (GLUCOPHAGE) 500 MG tablet TAKE 1 TABLET BY MOUTH 2 TIMES DAILY WITH A MEAL. ?Patient taking differently: Take 500 mg by mouth 2 (two) times daily with a meal. 10/20/21  Yes Bacigalupo, Dionne Bucy, MD  ?pregabalin (LYRICA) 75 MG capsule Take 1 capsule (75 mg total) by mouth 3 (three) times daily. 07/25/21  Yes Bacigalupo, Dionne Bucy, MD  ?Vibegron (GEMTESA) 75 MG TABS Take 75 mg  by mouth daily. 06/19/21  Yes MacDiarmid, Nicki Reaper, MD  ?amoxicillin-clavulanate (AUGMENTIN) 875-125 MG tablet Take 1 tablet by mouth every 12 (twelve) hours. ?Patient not taking: Reported on 03/02/2022 02/23/22   Geradine Girt, DO  ?clonazePAM (KLONOPIN) 0.5 MG tablet Take 0.5-1 tablets (0.25-0.5 mg total) by mouth 2 (two) times daily as needed for anxiety. ?Patient not taking: Reported on 03/02/2022 03/07/21   Virginia Crews, MD  ?famotidine (PEPCID) 20 MG tablet Take 1 tablet (20 mg total) by mouth 2 (two) times daily. ?Patient not taking: Reported on 03/02/2022 02/23/22   Geradine Girt, DO  ?ondansetron (ZOFRAN-ODT) 4 MG disintegrating tablet Take 1 tablet (4 mg total) by mouth every 8 (eight) hours as needed for nausea or vomiting. ?Patient not taking: Reported on 03/02/2022 01/17/22   Corena Herter, PA-C  ?tolterodine (DETROL LA) 4 MG 24 hr capsule Take 1 capsule (4 mg total) by mouth daily. ?Patient not taking: Reported on 03/02/2022 03/06/21   Bjorn Loser, MD  ? ? ?Physical Exam: ? ?Constitutional: NAD, calm, comfortable ?Vitals:  ? 03/02/22 1937 03/02/22 1938 03/02/22 2000 03/02/22 2210  ?BP: 127/63  129/60 (!) 134/47  ?Pulse: 90 87 88 91  ?Resp: (!) '21 18 19 ' (!) 21  ?Temp:      ?TempSrc:      ?SpO2: 100% 100% 100% 100%  ? ?Eyes: PERRL, lids and conjunctivae normal ?ENMT: Mucous membranes are moist. Posterior  pharynx clear of any exudate or lesions.Normal dentition.  ?Neck: normal, supple, no masses, no thyromegaly ?Respiratory: clear to auscultation bilaterally, no wheezing, no crackles. Normal respiratory effort.

## 2022-03-02 NOTE — ED Provider Notes (Signed)
?[facility] ?Provider Note ? ? ?CSN: [id number] ?Arrival date & time: 03/02/22  1320 ? ?  ? ?History ? ?Chief Complaint  ?Patient presents with  ? Wound Dehiscence  ? ? ?[name] [name] is a 71 y.o. female. ? ?The history is provided by the patient and medical records. No language interpreter was used.  ?Wound Check ?This is a recurrent problem. The current episode started yesterday. The problem occurs constantly. The problem has not changed since onset.Associated symptoms include abdominal pain. Pertinent negatives include no chest pain, no headaches and no shortness of breath. Nothing aggravates the symptoms. Nothing relieves the symptoms. She has tried nothing for the symptoms. The treatment provided no relief.  ? ?  ? ?Home Medications ?Prior to Admission medications   ?Medication Sig Start Date End Date Taking? Authorizing Provider  ?acetaminophen (TYLENOL) 325 MG tablet Take 650 mg by mouth every 6 (six) hours as needed for moderate pain.    [provider]  ?amoxicillin-clavulanate (AUGMENTIN) 875-125 MG tablet Take 1 tablet by mouth every 12 (twelve) hours. 02/23/22   Geradine Girt, DO  ?Biotin w/ Vitamins C & E (HAIR/SKIN/NAILS PO) Take 1 tablet by mouth daily.    [provider]  ?buPROPion (WELLBUTRIN XL) 300 MG 24 hr tablet TAKE 1 TABLET BY MOUTH EVERY DAY ?Patient taking differently: Take 300 mg by mouth daily. 09/11/21   Virginia Crews, MD  ?carvedilol (COREG) 6.25 MG tablet Take 1 tablet (6.25 mg total) by mouth 2 (two) times daily with a meal. 03/07/21   Bacigalupo, Dionne Bucy, MD  ?clonazePAM (KLONOPIN) 0.5 MG tablet Take 0.5-1 tablets (0.25-0.5 mg total) by mouth 2 (two) times daily as needed for anxiety. 03/07/21   Virginia Crews, MD  ?DULoxetine (CYMBALTA) 60 MG capsule TAKE 1 CAPSULE BY MOUTH EVERY DAY ?Patient taking differently: Take 60 mg by mouth daily. 10/20/21   Virginia Crews, MD  ?ezetimibe (ZETIA) 10 MG tablet TAKE 1  TABLET BY MOUTH EVERY DAY ?Patient taking differently: Take 10 mg by mouth daily. 12/16/21   Virginia Crews, MD  ?famotidine (PEPCID) 20 MG tablet Take 1 tablet (20 mg total) by mouth 2 (two) times daily. 02/23/22   Geradine Girt, DO  ?metFORMIN (GLUCOPHAGE) 500 MG tablet TAKE 1 TABLET BY MOUTH 2 TIMES DAILY WITH A MEAL. ?Patient taking differently: Take 500 mg by mouth 2 (two) times daily with a meal. 10/20/21   Bacigalupo, Dionne Bucy, MD  ?ondansetron (ZOFRAN-ODT) 4 MG disintegrating tablet Take 1 tablet (4 mg total) by mouth every 8 (eight) hours as needed for nausea or vomiting. 01/17/22   Corena Herter, PA-C  ?pregabalin (LYRICA) 75 MG capsule Take 1 capsule (75 mg total) by mouth 3 (three) times daily. 07/25/21   Virginia Crews, MD  ?tolterodine (DETROL LA) 4 MG 24 hr capsule Take 1 capsule (4 mg total) by mouth daily. 03/06/21   Bjorn Loser, MD  ?Vibegron (GEMTESA) 75 MG TABS Take 75 mg by mouth daily. 06/19/21   Bjorn Loser, MD  ?   ? ?Allergies    ?Sulfa antibiotics, Latex, Doxycycline, and Empagliflozin   ? ?Review of Systems   ?Review of Systems  ?Constitutional:  Positive for chills and fatigue. Negative for fever.  ?HENT:  Negative for congestion.   ?Respiratory:  Negative for cough, chest tightness, shortness of breath and wheezing.   ?Cardiovascular:  Negative for chest pain and palpitations.  ?Gastrointestinal:  Positive for abdominal pain. Negative  for constipation, diarrhea, nausea and vomiting.  ?Genitourinary:  Negative for dysuria, flank pain and frequency.  ?Musculoskeletal:  Negative for back pain, neck pain and neck stiffness.  ?Skin:  Positive for wound. Negative for rash.  ?Neurological:  Positive for light-headedness. Negative for dizziness, weakness, numbness and headaches.  ?Psychiatric/Behavioral:  Negative for agitation and confusion.   ?All other systems reviewed and are negative. ? ?Physical Exam ?Updated Vital Signs ?BP (!) 149/80   Pulse 93   Temp 98.3 ?F  (36.8 ?C) (Oral)   Resp (!) 24   LMP  (LMP Unknown) Comment: age 71  SpO2 100%  ?Physical Exam ?Vitals and nursing note reviewed.  ?Constitutional:   ?   General: She is not in acute distress. ?   Appearance: She is well-developed. She is not ill-appearing, toxic-appearing or diaphoretic.  ?HENT:  ?   Head: Normocephalic and atraumatic.  ?   Nose: No congestion or rhinorrhea.  ?   Mouth/Throat:  ?   Mouth: Mucous membranes are moist.  ?Eyes:  ?   Conjunctiva/sclera: Conjunctivae normal.  ?Cardiovascular:  ?   Rate and Rhythm: Normal rate and regular rhythm.  ?   Heart sounds: No murmur heard. ?Pulmonary:  ?   Effort: Pulmonary effort is normal. No respiratory distress.  ?   Breath sounds: Normal breath sounds. No wheezing, rhonchi or rales.  ?Chest:  ?   Chest wall: No tenderness.  ?Abdominal:  ?   General: Abdomen is flat. Bowel sounds are normal.  ?   Palpations: Abdomen is soft.  ?   Tenderness: There is abdominal tenderness in the right lower quadrant, suprapubic area and left lower quadrant. There is no right CVA tenderness, left CVA tenderness, guarding or rebound.  ? ? ?Musculoskeletal:     ?   General: Tenderness present. No swelling.  ?   Cervical back: Neck supple.  ?Skin: ?   General: Skin is warm and dry.  ?   Capillary Refill: Capillary refill takes less than 2 seconds.  ?   Findings: Erythema and rash present.  ?Neurological:  ?   General: No focal deficit present.  ?   Mental Status: She is alert.  ?   Sensory: No sensory deficit.  ?   Motor: No weakness.  ?Psychiatric:     ?   Mood and Affect: Mood normal.  ? ? ? ? ? ? ? ? ? ?ED Results / Procedures / Treatments   ?Labs ?(all labs ordered are listed, but only abnormal results are displayed) ?Labs Reviewed  ?CBC WITH DIFFERENTIAL/PLATELET - Abnormal; Notable for the following components:  ?    Result Value  ? RBC 3.69 (*)   ? Hemoglobin 9.5 (*)   ? HCT 31.1 (*)   ? MCH 25.7 (*)   ? Platelets 498 (*)   ? All other components within normal limits   ?BASIC METABOLIC PANEL - Abnormal; Notable for the following components:  ? Glucose, Bld 256 (*)   ? All other components within normal limits  ?LACTIC ACID, PLASMA - Abnormal; Notable for the following components:  ? Lactic Acid, Venous 3.1 (*)   ? All other components within normal limits  ?LACTIC ACID, PLASMA - Abnormal; Notable for the following components:  ? Lactic Acid, Venous 2.5 (*)   ? All other components within normal limits  ?URINALYSIS, ROUTINE W REFLEX MICROSCOPIC - Abnormal; Notable for the following components:  ? APPearance HAZY (*)   ? Hgb urine dipstick SMALL (*)   ?  Leukocytes,Ua TRACE (*)   ? Bacteria, UA FEW (*)   ? Non Squamous Epithelial 6-10 (*)   ? All other components within normal limits  ?CULTURE, BLOOD (ROUTINE X 2)  ?CULTURE, BLOOD (ROUTINE X 2)  ? ? ?EKG ?None ? ?Radiology ?CT ABDOMEN PELVIS W CONTRAST ? ?Result Date: 03/02/2022 ?CLINICAL DATA:  Abdominal pain. Status post panniculectomy on 02/05/2022. Patient complains of opening of ventral surgical wound. EXAM: CT ABDOMEN AND PELVIS WITH CONTRAST TECHNIQUE: Multidetector CT imaging of the abdomen and pelvis was performed using the standard protocol following bolus administration of intravenous contrast. RADIATION DOSE REDUCTION: This exam was performed according to the departmental dose-optimization program which includes automated exposure control, adjustment of the mA and/or kV according to patient size and/or use of iterative reconstruction technique. CONTRAST:  131m OMNIPAQUE IOHEXOL 350 MG/ML SOLN COMPARISON:  02/16/2022 FINDINGS: Lower chest: No acute abnormality. Hepatobiliary: No focal liver abnormality. Large calcified gallstone measures 2.2 cm. No gallbladder wall thickening or bile duct dilatation. Pancreas: Unremarkable. No pancreatic ductal dilatation or surrounding inflammatory changes. Spleen: Normal in size without focal abnormality. Adrenals/Urinary Tract: Normal adrenal glands. Bilateral kidney stones. The  largest stone is in the left renal collecting system measuring 7 mm, image 31/3. No hydronephrosis or mass. Urinary bladder is unremarkable. Stomach/Bowel: Stomach is within normal limits. Appendix appears normal. No evid

## 2022-03-02 NOTE — Telephone Encounter (Signed)
Sung Amabile, MD - ED physician at [facility] called to say he has a post-op patient of Dr. Eusebio Friendly in the ED, [name]  He said she had a panniculectomy with [name] on 02/05/2022.  He said she spoke with our team yesterday and she has some wound "dehissents" (not sure this word is correct) and it was looking worse per the wound nurse who came by.  He said it's been hurting worse, looking more red and warm, and now has puss coming out of it.  He said she is here now.  He said he is trying to touch base with Dr. Eusebio Friendly team.  He said he thinks he must have just missed Dr. Marla Roe being on-call for the wound team.  He said he is going to get a CT scan and some labs.  He said he is assuming she had IV antibiotics in admission but she was told she may have to have it opened up with surgery.  He said he will try to talk to General Surgery whenever they call him back.  He said his number is [phone].  He said he is on the orange zone in the [facility] ED.  He said hopefully he will hear from Dr. Marla Roe, if not he is going to try to get her admitted with antibiotics.  He said he appreciates your help. ?

## 2022-03-02 NOTE — Progress Notes (Signed)
Pharmacy Antibiotic Note ? ?[name] [name] is a 71 y.o. female admitted on 03/02/2022 presenting with worsening surgical wound with purulence.  Pharmacy has been consulted for vancomycin and zosyn dosing. ? ?Plan: ?Vancomycin 1750 mg IV x 1, then 750 mg IV q 12h (eAUC 468, Goal AUC 400-550, SCr used 0.8) ?Zosyn 3.375g IV q 8h (extended 4h infusion) ?Monitor renal function, Cx and surgical plans ?Vancomycin levels as indicated ? ?  ? ?Temp (24hrs), Avg:98.3 ?F (36.8 ?C), Min:98.3 ?F (36.8 ?C), Max:98.3 ?F (36.8 ?C) ? ?Recent Labs  ?Lab 03/02/22 ?1350 03/02/22 ?1600  ?WBC 6.8  --   ?CREATININE 0.77  --   ?LATICACIDVEN 3.1* 2.5*  ?  ?Estimated Creatinine Clearance: 85.6 mL/min (by C-G formula based on SCr of 0.77 mg/dL).   ? ?Allergies  ?Allergen Reactions  ? Sulfa Antibiotics Itching, Rash and Swelling  ? Latex Itching  ? Doxycycline Rash  ? Empagliflozin Itching and Other (See Comments)  ?  Itching and frequent yeast infections  ? ? ?Bertis Ruddy, PharmD ?Clinical Pharmacist ?ED Pharmacist Phone # [phone] ?03/02/2022 7:39 PM ? ? ?

## 2022-03-02 NOTE — ED Provider Triage Note (Signed)
Emergency Medicine Provider Triage Evaluation Note ? ?[name] [name] , a 71 y.o. female  was evaluated in triage.  Pt complains of wound dehiscence.  Patient states that she had recent abdominal surgery that required additional operation to clean out infection.  Patient here today stating that last night her wound opened back up 3 inches. ? ?Review of Systems  ?Positive: Wound dehiscence ?Negative: Fevers, body aches, chills, nausea, vomiting, diarrhea, abdominal pain, chest pain, shortness of breath ? ?Physical Exam  ?BP (!) 146/80 (BP Location: Left Arm)   Pulse (!) 107   Temp 98.3 ?F (36.8 ?C) (Oral)   Resp 16   LMP  (LMP Unknown) Comment: age 46  SpO2 100%  ?Gen:   Awake, no distress   ?Resp:  Normal effort  ?MSK:   Moves extremities without difficulty  ?Other:   ? ?Medical Decision Making  ?Medically screening exam initiated at 1:40 PM.  Appropriate orders placed.  [name] [name] was informed that the remainder of the evaluation will be completed by another provider, this initial triage assessment does not replace that evaluation, and the importance of remaining in the ED until their evaluation is complete. ? ? ?  ?Azucena Cecil, PA-C ?03/02/22 1341 ? ?

## 2022-03-03 DIAGNOSIS — E118 Type 2 diabetes mellitus with unspecified complications: Secondary | ICD-10-CM

## 2022-03-03 DIAGNOSIS — T8130XA Disruption of wound, unspecified, initial encounter: Secondary | ICD-10-CM | POA: Diagnosis present

## 2022-03-03 DIAGNOSIS — L03311 Cellulitis of abdominal wall: Secondary | ICD-10-CM | POA: Diagnosis present

## 2022-03-03 DIAGNOSIS — D649 Anemia, unspecified: Secondary | ICD-10-CM

## 2022-03-03 DIAGNOSIS — I2510 Atherosclerotic heart disease of native coronary artery without angina pectoris: Secondary | ICD-10-CM

## 2022-03-03 LAB — GLUCOSE, CAPILLARY
Glucose-Capillary: 142 mg/dL — ABNORMAL HIGH (ref 70–99)
Glucose-Capillary: 134 mg/dL — ABNORMAL HIGH (ref 70–99)
Glucose-Capillary: 189 mg/dL — ABNORMAL HIGH (ref 70–99)
Glucose-Capillary: 147 mg/dL — ABNORMAL HIGH (ref 70–99)
Glucose-Capillary: 113 mg/dL — ABNORMAL HIGH (ref 70–99)
Glucose-Capillary: 103 mg/dL — ABNORMAL HIGH (ref 70–99)

## 2022-03-03 LAB — CBC
HCT: 25.7 % — ABNORMAL LOW (ref 36.0–46.0)
Hemoglobin: 7.7 g/dL — ABNORMAL LOW (ref 12.0–15.0)
MCH: 25.1 pg — ABNORMAL LOW (ref 26.0–34.0)
MCHC: 30 g/dL (ref 30.0–36.0)
MCV: 83.7 fL (ref 80.0–100.0)
Platelets: 405 10*3/uL — ABNORMAL HIGH (ref 150–400)
RBC: 3.07 MIL/uL — ABNORMAL LOW (ref 3.87–5.11)
RDW: 14.9 % (ref 11.5–15.5)
WBC: 4.7 10*3/uL (ref 4.0–10.5)
nRBC: 0 % (ref 0.0–0.2)

## 2022-03-03 LAB — FERRITIN: Ferritin: 38 ng/mL (ref 11–307)

## 2022-03-03 LAB — HEMOGLOBIN A1C
Hgb A1c MFr Bld: 7.4 % — ABNORMAL HIGH (ref 4.8–5.6)
Mean Plasma Glucose: 165.68 mg/dL

## 2022-03-03 LAB — COMPREHENSIVE METABOLIC PANEL
ALT: 11 U/L (ref 0–44)
AST: 12 U/L — ABNORMAL LOW (ref 15–41)
Albumin: 1.9 g/dL — ABNORMAL LOW (ref 3.5–5.0)
Alkaline Phosphatase: 55 U/L (ref 38–126)
Anion gap: 8 (ref 5–15)
BUN: 11 mg/dL (ref 8–23)
CO2: 26 mmol/L (ref 22–32)
Calcium: 8.2 mg/dL — ABNORMAL LOW (ref 8.9–10.3)
Chloride: 105 mmol/L (ref 98–111)
Creatinine, Ser: 0.77 mg/dL (ref 0.44–1.00)
GFR, Estimated: >60 mL/min (ref >60)
Glucose, Bld: 136 mg/dL — ABNORMAL HIGH (ref 70–99)
Potassium: 3.9 mmol/L (ref 3.5–5.1)
Sodium: 139 mmol/L (ref 135–145)
Total Bilirubin: 0.4 mg/dL (ref 0.3–1.2)
Total Protein: 5.9 g/dL — ABNORMAL LOW (ref 6.5–8.1)

## 2022-03-03 LAB — IRON AND TIBC
Iron: 20 ug/dL — ABNORMAL LOW (ref 28–170)
Saturation Ratios: 8 % — ABNORMAL LOW (ref 10.4–31.8)
TIBC: 242 ug/dL — ABNORMAL LOW (ref 250–450)
UIBC: 222 ug/dL

## 2022-03-03 LAB — RETICULOCYTES
Immature Retic Fract: 26.7 % — ABNORMAL HIGH (ref 2.3–15.9)
RBC.: 3.09 MIL/uL — ABNORMAL LOW (ref 3.87–5.11)
Retic Count, Absolute: 80.6 10*3/uL (ref 19.0–186.0)
Retic Ct Pct: 2.6 % (ref 0.4–3.1)

## 2022-03-03 LAB — TYPE AND SCREEN
ABO/RH(D): A POS
Antibody Screen: NEGATIVE

## 2022-03-03 MED ORDER — HEPARIN SODIUM (PORCINE) 5000 UNIT/ML IJ SOLN
5000.0000 [IU] | Freq: Three times a day (TID) | INTRAMUSCULAR | Status: AC
  Administered 2022-03-03 (×2): 5000 [IU] via SUBCUTANEOUS
  Filled 2022-03-03 (×2): qty 1

## 2022-03-03 NOTE — Assessment & Plan Note (Addendum)
Echocardiogram report in our system from 2016 showed that EF was 25 to 30% at that time.  She is followed by [facility] clinic cardiology in [place].   ?Care everywhere was reviewed.  It appears that her systolic function was noted to be normal when checked in August 2022.  Noted to be on carvedilol. ?Cardiac status is stable. ?

## 2022-03-03 NOTE — Assessment & Plan Note (Addendum)
History of stent placement previously.  Cardiac status appears to be stable.  Continue beta-blocker.  Not noted to be on any antiplatelet agents currently.  Noted to be on Zetia. ?

## 2022-03-03 NOTE — Evaluation (Signed)
Physical Therapy Evaluation ?Patient Details ?Name: [name] [name] ?MRN: [id number] ?DOB: 1950-12-20 ?Today's Date: 03/03/2022 ? ?History of Present Illness ? 71 y/o female presented to ED on 03/02/22 for complaint of surgical abdominal wound dehiscence. Recently admitted 3/15-3/24 for fall and wound care. S/p liposuction and panniculectomy on 3/6. PMH: HTN, uterine cancer, DM2, CHF, CAD with prior MI  ?Clinical Impression ? Patient admitted with the above. Patient presents with generalized weakness, decreased activity tolerance, and impaired balance. Patient able to stand from EOB with supervision and ambulate a few steps prior to wound began draining into floor without ceasing. Returned to sitting EOB and assisted with cleaning up patient, notified RN of drainage. Patient eager to participate with therapy to become stronger. Patient will benefit from skilled PT services during acute stay to address listed deficits. Recommend HHPT at discharge to maximize functional independence.    ?   ? ?Recommendations for follow up therapy are one component of a multi-disciplinary discharge planning process, led by the attending physician.  Recommendations may be updated based on patient status, additional functional criteria and insurance authorization. ? ?Follow Up Recommendations Home health PT ? ?  ?Assistance Recommended at Discharge Intermittent Supervision/Assistance  ?Patient can return home with the following ? Assistance with cooking/housework;Help with stairs or ramp for entrance;Assist for transportation;A little help with bathing/dressing/bathroom ? ?  ?Equipment Recommendations Rolling Female Iafrate (2 wheels)  ?Recommendations for Other Services ?    ?  ?Functional Status Assessment Patient has had a recent decline in their functional status and demonstrates the ability to make significant improvements in function in a reasonable and predictable amount of time.  ? ?  ?Precautions / Restrictions Precautions ?Precautions:  Fall ?Restrictions ?Weight Bearing Restrictions: No  ? ?  ? ?Mobility ? Bed Mobility ?Overal bed mobility: Modified Independent ?  ?  ?  ?  ?  ?  ?  ?  ? ?Transfers ?Overall transfer level: Needs assistance ?Equipment used: None ?Transfers: Sit to/from Stand ?Sit to Stand: Supervision ?  ?  ?  ?  ?  ?General transfer comment: increased time to complete from low bed surface ?  ? ?Ambulation/Gait ?Ambulation/Gait assistance: Supervision ?Gait Distance (Feet): 5 Feet ?Assistive device: None ?Gait Pattern/deviations: Step-through pattern, Decreased stride length, Wide base of support, Trunk flexed ?Gait velocity: decreased ?  ?  ?General Gait Details: supervision for safety. Took steps forward and immediately wound began draining into floor without ceasing. Returned to sitting EOB and cleaned patient up. Notified RN ? ?Stairs ?  ?  ?  ?  ?  ? ?Wheelchair Mobility ?  ? ?Modified Rankin (Stroke Patients Only) ?  ? ?  ? ?Balance Overall balance assessment: Mild deficits observed, not formally tested ?  ?  ?  ?  ?  ?  ?  ?  ?  ?  ?  ?  ?  ?  ?  ?  ?  ?  ?   ? ? ? ?Pertinent Vitals/Pain Pain Assessment ?Pain Assessment: Faces ?Faces Pain Scale: Hurts a little bit ?Pain Location: abdomen ?Pain Descriptors / Indicators: Guarding ?Pain Intervention(s): Monitored during session  ? ? ?Home Living Family/patient expects to be discharged to:: Private residence ?Living Arrangements: Alone ?Available Help at Discharge: Family ?Type of Home: House ?Home Access: Stairs to enter ?  ?Entrance Stairs-Number of Steps: 1 ?Alternate Level Stairs-Number of Steps: 2 steps to get into dining room from living room ?Home Layout: Multi-level ?Home Equipment: Conservation officer, nature (2 wheels);Cane - single point ?   ?  ?  Prior Function Prior Level of Function : Independent/Modified Independent ?  ?  ?  ?  ?  ?  ?  ?  ?  ? ? ?Hand Dominance  ?   ? ?  ?Extremity/Trunk Assessment  ? Upper Extremity Assessment ?Upper Extremity Assessment: Generalized weakness ?   ? ?Lower Extremity Assessment ?Lower Extremity Assessment: Generalized weakness ?  ? ?Cervical / Trunk Assessment ?Cervical / Trunk Assessment: Kyphotic  ?Communication  ? Communication: No difficulties  ?Cognition Arousal/Alertness: Awake/alert ?Behavior During Therapy: [facility] for tasks assessed/performed ?Overall Cognitive Status: Within Functional Limits for tasks assessed ?  ?  ?  ?  ?  ?  ?  ?  ?  ?  ?  ?  ?  ?  ?  ?  ?  ?  ?  ? ?  ?General Comments   ? ?  ?Exercises    ? ?Assessment/Plan  ?  ?PT Assessment Patient needs continued PT services  ?PT Problem List Decreased strength;Decreased activity tolerance;Decreased balance;Decreased mobility ? ?   ?  ?PT Treatment Interventions DME instruction;Gait training;Stair training;Functional mobility training;Therapeutic activities;Therapeutic exercise;Balance training;Patient/family education   ? ?PT Goals (Current goals can be found in the Care Plan section)  ?Acute Rehab PT Goals ?Patient Stated Goal: to get this healed so I can go to [place] with my fiance ?PT Goal Formulation: With patient ?Time For Goal Achievement: 03/17/22 ?Potential to Achieve Goals: Good ? ?  ?Frequency Min 3X/week ?  ? ? ?Co-evaluation   ?  ?  ?  ?  ? ? ?  ?AM-PAC PT "6 Clicks" Mobility  ?Outcome Measure Help needed turning from your back to your side while in a flat bed without using bedrails?: None ?Help needed moving from lying on your back to sitting on the side of a flat bed without using bedrails?: None ?Help needed moving to and from a bed to a chair (including a wheelchair)?: A Little ?Help needed standing up from a chair using your arms (e.g., wheelchair or bedside chair)?: A Little ?Help needed to walk in hospital room?: A Little ?Help needed climbing 3-5 steps with a railing? : A Lot ?6 Click Score: 19 ? ?  ?End of Session   ?Activity Tolerance: Patient tolerated treatment well ?Patient left: in bed;with call bell/phone within reach ?Nurse Communication: Mobility status ?PT Visit  Diagnosis: Unsteadiness on feet (R26.81);Muscle weakness (generalized) (M62.81);History of falling (Z91.81);Pain ?  ? ?Time: 6256-3893 ?PT Time Calculation (min) (ACUTE ONLY): 21 min ? ? ?Charges:   PT Evaluation ?$PT Eval Moderate Complexity: 1 Mod ?  ?  ?   ? ? ?Shabre Kreher A. Gilford Rile, PT, DPT ?Acute Rehabilitation Services ?Pager [phone] ?Office [phone] ? ? ?Adriella Essex A Alem Fahl ?03/03/2022, 11:30 AM ? ?

## 2022-03-03 NOTE — Assessment & Plan Note (Addendum)
HbA1c 7.4.  Continue home medications ?

## 2022-03-03 NOTE — Progress Notes (Signed)
? ?[facility] ?PROGRESS NOTE ? ? ?KRISTAIN FILO JYN:[id number] DOB: 1951/02/12 DOA: 03/02/2022  1 ?DOS: the patient was seen and examined on 03/03/2022 ? ?PCP: Virginia Crews, MD ? ?Brief History and Hospital Course:  ?71 y.o. female with medical history significant of diabetes mellitus x2, fibromyalgia, chronic systolic CHF, coronary artery disease with prior myocardial infarction, hypertension, hyperlipidemia, previous uterine cancer and recent panniculectomy for panniculitis cysts by Dr. Royce Macadamia on 02/05/22 who presented with wound dehiscence, foul purulent drainage, chills, fatigue, and abdominal discomfort.  Patient was hospitalized for further management.  Plan is for washout on Sunday.   ? ?Consultants: Plastic surgery ? ?Procedures: None yet ? ? ? ?Subjective: ?Patient denies any significant abdominal pain.  No nausea vomiting.  No shortness of breath or chest pain. ? ? ? ?Assessment/Plan: ? ? ?* Abdominal wall cellulitis ?Patient recently underwent panniculectomy by plastic surgery.  Has experienced wound dehiscence and cellulitis of the abdominal wall.  CT scan did not show any obvious abscess.  Patient started on broad-spectrum antibiotics with vancomycin and Zosyn.  Follow-up on cultures.  Plastic surgery plans to do a wound washout on Sunday.  WBC is normal.  Lactic acid level was mildly elevated. ? ?Wound dehiscence ?As per plastic surgery ? ?Chronic systolic CHF (congestive heart failure), NYHA class 3 ([place]) ?There is an echocardiogram report from 2016 which showed that EF was 25 to 30% at that time.  She is followed by [facility] clinic cardiology in [place].  Care everywhere was reviewed.  It appears that her systolic function was noted to be normal when checked in August 2022.  Noted to be on carvedilol. ? ?Coronary artery disease ?History of stent placement previously.  Cardiac status appears to be stable.  Continue beta-blocker.  Not noted to be on any antiplatelet agents currently.   Noted to be on Zetia. ? ?Type 2 diabetes mellitus with complication, without long-term current use of insulin ([place]) ?HbA1c 7.4.  She is on metformin at home.  Continue SSI.  Monitor CBGs. ? ?Normocytic anemia ?Old labs reviewed.  Hemoglobin has been between 8 and 10 during most of this year.  Drop in hemoglobin this morning is likely dilutional.  No overt blood loss noted.  Ferritin noted to be 38, iron 20, TIBC 242, percent saturation is 8.  We will check vitamin Q65 and folic acid levels as well.  Transfuse if her hemoglobin drops below 7. ? ? ?Morbid obesity ?Estimated body mass index is 37.64 kg/m? as calculated from the following: ?  Height as of 02/17/22: '5\' 7"'$  (1.702 m). ?  Weight as of 02/23/22: 109 kg. ? ? ?DVT Prophylaxis: Initiate subcutaneous heparin ?Code Status: Full code ?Family Communication: Discussed with the patient ?Disposition Plan: Anticipate discharge back home when improved.  Needs to be mobilized.  Will involve PT and OT. ? ?Status is: Inpatient ?Remains inpatient appropriate because: Need for IV antibiotics and surgical intervention ? ? ? ? ?Medications: Scheduled: ? buPROPion  300 mg Oral Daily  ? carvedilol  6.25 mg Oral BID WC  ? DULoxetine  60 mg Oral Daily  ? ezetimibe  10 mg Oral Daily  ? insulin aspart  0-20 Units Subcutaneous TID WC  ? insulin aspart  0-5 Units Subcutaneous QHS  ? mirabegron ER  25 mg Oral Daily  ? pregabalin  75 mg Oral TID  ? sodium chloride flush  3 mL Intravenous Q12H  ? ?Continuous: ? 0.9 % NaCl with KCl 20 mEq / L 75 mL/hr  at 03/02/22 2359  ? piperacillin-tazobactam (ZOSYN)  IV Stopped (03/03/22 0753)  ? vancomycin    ? ?[id number] **OR** acetaminophen, morphine injection, ondansetron **OR** ondansetron (ZOFRAN) IV, oxyCODONE, polyethylene glycol, traZODone ? ?Antibiotics: ?Anti-infectives (From admission, onward)  ? ? Start     Dose/Rate Route Frequency Ordered Stop  ? 03/03/22 1000  vancomycin (VANCOREADY) IVPB 750 mg/150 mL       ? 750 mg ?150 mL/hr  over 60 Minutes Intravenous Every 12 hours 03/02/22 1943    ? 03/03/22 0200  piperacillin-tazobactam (ZOSYN) IVPB 3.375 g       ? 3.375 g ?12.5 mL/hr over 240 Minutes Intravenous Every 8 hours 03/02/22 1943    ? 03/02/22 1930  piperacillin-tazobactam (ZOSYN) IVPB 3.375 g       ? 3.375 g ?100 mL/hr over 30 Minutes Intravenous  Once 03/02/22 1924 03/02/22 2008  ? 03/02/22 1930  vancomycin (VANCOREADY) IVPB 1750 mg/350 mL       ? 1,750 mg ?175 mL/hr over 120 Minutes Intravenous  Once 03/02/22 1924 03/02/22 2238  ? ?  ? ? ?Objective: ? ?Vital Signs ? ?Vitals:  ? 03/02/22 2330 03/03/22 0400 03/03/22 0551 03/03/22 0834  ?BP: (!) 119/54 (!) 143/63 (!) 128/56 (!) 114/55  ?Pulse: 87 81 76 78  ?Resp: '20 17 19 18  '$ ?Temp:   99.1 ?F (37.3 ?C) 97.9 ?F (36.6 ?C)  ?TempSrc:   Oral Oral  ?SpO2: 99% 95% 97% 96%  ? ?No intake or output data in the 24 hours ending 03/03/22 1012 ?There were no vitals filed for this visit. ? ?General appearance: Awake alert.  In no distress ?Resp: Clear to auscultation bilaterally.  Normal effort ?Cardio: S1-S2 is normal regular.  No S3-S4.  No rubs murmurs or bruit ?GI: Abdomen is soft.  Tender in the lower abdomen.  Erythema is noted.  Covered in dressing which was not removed. ?Extremities: Chronic edema noted bilateral lower extremity ?Neurologic: Alert and oriented x3.  No focal neurological deficits.  ? ? ?Lab Results: ? ?Data Reviewed: I have personally reviewed labs and imaging study reports ? ?CBC: ?Recent Labs  ?Lab 03/02/22 ?1350 03/03/22 ?0646  ?WBC 6.8 4.7  ?NEUTROABS 5.0  --   ?HGB 9.5* 7.7*  ?HCT 31.1* 25.7*  ?MCV 84.3 83.7  ?PLT 498* 405*  ? ? ?Basic Metabolic Panel: ?Recent Labs  ?Lab 03/02/22 ?1350 03/03/22 ?0646  ?NA 135 139  ?K 3.8 3.9  ?CL 100 105  ?CO2 23 26  ?GLUCOSE 256* 136*  ?BUN 15 11  ?CREATININE 0.77 0.77  ?CALCIUM 8.9 8.2*  ? ? ?GFR: ?Estimated Creatinine Clearance: 85.6 mL/min (by C-G formula based on SCr of 0.77 mg/dL). ? ?Liver Function Tests: ?Recent Labs  ?Lab  03/03/22 ?1937  ?AST 12*  ?ALT 11  ?ALKPHOS 55  ?BILITOT 0.4  ?PROT 5.9*  ?ALBUMIN 1.9*  ? ? ? ?HbA1C: ?Recent Labs  ?  03/03/22 ?0646  ?HGBA1C 7.4*  ? ? ?CBG: ?Recent Labs  ?Lab 03/02/22 ?2358 03/03/22 ?9024 03/03/22 ?0973  ?GLUCAP 178* 142* 134*  ? ? ? ?Anemia Panel: ?Recent Labs  ?  03/03/22 ?0646  ?FERRITIN 38  ?TIBC 242*  ?IRON 20*  ?RETICCTPCT 2.6  ? ? ?Recent Results (from the past 240 hour(s))  ?Blood culture (routine x 2)     Status: None (Preliminary result)  ? Collection Time: 03/02/22  5:13 PM  ? Specimen: BLOOD  ?Result Value Ref Range Status  ? Specimen Description BLOOD BLOOD RIGHT FOREARM  Final  ?  Special Requests   Final  ?  BOTTLES DRAWN AEROBIC AND ANAEROBIC Blood Culture results may not be optimal due to an inadequate volume of blood received in culture bottles  ? Culture   Final  ?  NO GROWTH < 24 HOURS ?Performed at [facility], [place] [address], [place], [place] [address] ?  ? Report Status PENDING  Incomplete  ?  ? ? ?Radiology Studies: ?CT ABDOMEN PELVIS W CONTRAST ? ?Result Date: 03/02/2022 ?CLINICAL DATA:  Abdominal pain. Status post panniculectomy on 02/05/2022. Patient complains of opening of ventral surgical wound. EXAM: CT ABDOMEN AND PELVIS WITH CONTRAST TECHNIQUE: Multidetector CT imaging of the abdomen and pelvis was performed using the standard protocol following bolus administration of intravenous contrast. RADIATION DOSE REDUCTION: This exam was performed according to the departmental dose-optimization program which includes automated exposure control, adjustment of the mA and/or kV according to patient size and/or use of iterative reconstruction technique. CONTRAST:  183m OMNIPAQUE IOHEXOL 350 MG/ML SOLN COMPARISON:  02/16/2022 FINDINGS: Lower chest: No acute abnormality. Hepatobiliary: No focal liver abnormality. Large calcified gallstone measures 2.2 cm. No gallbladder wall thickening or bile duct dilatation. Pancreas: Unremarkable. No pancreatic ductal dilatation or  surrounding inflammatory changes. Spleen: Normal in size without focal abnormality. Adrenals/Urinary Tract: Normal adrenal glands. Bilateral kidney stones. The largest stone is in the left renal collecting system m

## 2022-03-03 NOTE — Assessment & Plan Note (Addendum)
Patient recently underwent panniculectomy by plastic surgery.  Developed wound dehiscence and cellulitis of the abdominal wall.  CT scan did not show any obvious abscess.  Patient started on broad-spectrum antibiotics with vancomycin and Zosyn.  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 ?Patient was seen by plastic surgery. Underwent excision of the abdominal wound, partial closure of the wound and placement of wound VAC on 4/2. ?Cultures growing MRSA.  Also growing PASTEURELLA MULTOCIDA . ?Sensitivities reviewed.  Patient allergic to doxycycline and sulfa antibiotics.  We will discharge her on Augmentin and clindamycin. ?She will be going home with the wound VAC as per plastic surgery. ? ?

## 2022-03-03 NOTE — Hospital Course (Addendum)
71 y.o. female with medical history significant of diabetes mellitus x2, fibromyalgia, chronic systolic CHF, coronary artery disease with prior myocardial infarction, hypertension, hyperlipidemia, previous uterine cancer and recent panniculectomy for panniculitis cysts by Dr. Royce Macadamia on 02/05/22 who presented with wound dehiscence, foul purulent drainage, chills, fatigue, and abdominal discomfort.  Patient was hospitalized for further management.  Underwent excision surgery and wound VAC placement on 4/2. ?

## 2022-03-03 NOTE — Assessment & Plan Note (Addendum)
As per plastic surgery.  Underwent surgery on 4/2. ?

## 2022-03-03 NOTE — Assessment & Plan Note (Addendum)
Old labs reviewed.  Hemoglobin has been between 8 and 10 during most of this year.   ?Drop in hemoglobin during this hospital stay is likely dilutional.  No evidence of overt blood loss. ?Ferritin noted to be 38, iron 20, TIBC 242, percent saturation is 8.   ?Vitamin B12 level is borderline low at 234.  Folate is 10.8.  Started on B12 supplementation. ?Hemoglobin has been stable. ?

## 2022-03-04 ENCOUNTER — Inpatient Hospital Stay (HOSPITAL_COMMUNITY): Payer: Medicare Other | Admitting: Certified Registered"

## 2022-03-04 ENCOUNTER — Other Ambulatory Visit: Payer: Self-pay

## 2022-03-04 ENCOUNTER — Encounter (HOSPITAL_COMMUNITY): Payer: Self-pay | Admitting: Internal Medicine

## 2022-03-04 ENCOUNTER — Encounter (HOSPITAL_COMMUNITY)
Admission: EM | Disposition: A | Discharge status: Home Health | Payer: Self-pay | Source: Home / Self Care | Attending: Internal Medicine

## 2022-03-04 DIAGNOSIS — I2510 Atherosclerotic heart disease of native coronary artery without angina pectoris: Secondary | ICD-10-CM

## 2022-03-04 DIAGNOSIS — T8189XA Other complications of procedures, not elsewhere classified, initial encounter: Secondary | ICD-10-CM

## 2022-03-04 DIAGNOSIS — W19XXXA Unspecified fall, initial encounter: Secondary | ICD-10-CM

## 2022-03-04 DIAGNOSIS — E538 Deficiency of other specified B group vitamins: Secondary | ICD-10-CM

## 2022-03-04 DIAGNOSIS — I5030 Unspecified diastolic (congestive) heart failure: Secondary | ICD-10-CM

## 2022-03-04 DIAGNOSIS — I110 Hypertensive heart disease with heart failure: Secondary | ICD-10-CM

## 2022-03-04 DIAGNOSIS — T8131XA Disruption of external operation (surgical) wound, not elsewhere classified, initial encounter: Secondary | ICD-10-CM

## 2022-03-04 HISTORY — PX: INCISION AND DRAINAGE OF WOUND: SHX1803

## 2022-03-04 LAB — VITAMIN B12: Vitamin B-12: 234 pg/mL (ref 180–914)

## 2022-03-04 LAB — CBC
HCT: 25.3 % — ABNORMAL LOW (ref 36.0–46.0)
Hemoglobin: 7.6 g/dL — ABNORMAL LOW (ref 12.0–15.0)
MCH: 25.2 pg — ABNORMAL LOW (ref 26.0–34.0)
MCHC: 30 g/dL (ref 30.0–36.0)
MCV: 83.8 fL (ref 80.0–100.0)
Platelets: 405 10*3/uL — ABNORMAL HIGH (ref 150–400)
RBC: 3.02 MIL/uL — ABNORMAL LOW (ref 3.87–5.11)
RDW: 14.9 % (ref 11.5–15.5)
WBC: 5 10*3/uL (ref 4.0–10.5)
nRBC: 0 % (ref 0.0–0.2)

## 2022-03-04 LAB — BASIC METABOLIC PANEL
Anion gap: 6 (ref 5–15)
BUN: 13 mg/dL (ref 8–23)
CO2: 25 mmol/L (ref 22–32)
Calcium: 8.1 mg/dL — ABNORMAL LOW (ref 8.9–10.3)
Chloride: 105 mmol/L (ref 98–111)
Creatinine, Ser: 0.68 mg/dL (ref 0.44–1.00)
GFR, Estimated: >60 mL/min (ref >60)
Glucose, Bld: 112 mg/dL — ABNORMAL HIGH (ref 70–99)
Potassium: 4 mmol/L (ref 3.5–5.1)
Sodium: 136 mmol/L (ref 135–145)

## 2022-03-04 LAB — SURGICAL PCR SCREEN
MRSA, PCR: POSITIVE — AB
Staphylococcus aureus: POSITIVE — AB

## 2022-03-04 LAB — FOLATE: Folate: 10.8 ng/mL (ref >5.9)

## 2022-03-04 LAB — GLUCOSE, CAPILLARY
Glucose-Capillary: 106 mg/dL — ABNORMAL HIGH (ref 70–99)
Glucose-Capillary: 118 mg/dL — ABNORMAL HIGH (ref 70–99)
Glucose-Capillary: 108 mg/dL — ABNORMAL HIGH (ref 70–99)
Glucose-Capillary: 189 mg/dL — ABNORMAL HIGH (ref 70–99)
Glucose-Capillary: 277 mg/dL — ABNORMAL HIGH (ref 70–99)
Glucose-Capillary: 154 mg/dL — ABNORMAL HIGH (ref 70–99)

## 2022-03-04 SURGERY — IRRIGATION AND DEBRIDEMENT WOUND
Anesthesia: General | Site: Abdomen

## 2022-03-04 MED ORDER — SUGAMMADEX SODIUM 200 MG/2ML IV SOLN
INTRAVENOUS | Status: DC | PRN
  Administered 2022-03-04: 250 mg via INTRAVENOUS

## 2022-03-04 MED ORDER — CHLORHEXIDINE GLUCONATE 0.12 % MT SOLN: 15.0000 mL | Freq: Once | OROMUCOSAL | Status: AC

## 2022-03-04 MED ORDER — CHLORHEXIDINE GLUCONATE CLOTH 2 % EX PADS
6.0000 | MEDICATED_PAD | Freq: Every day | CUTANEOUS | Status: DC
  Administered 2022-03-05 – 2022-03-06 (×2): 6 via TOPICAL

## 2022-03-04 MED ORDER — FENTANYL CITRATE (PF) 100 MCG/2ML IJ SOLN: 25.0000 ug | INTRAMUSCULAR | Status: DC | PRN

## 2022-03-04 MED ORDER — CHLORHEXIDINE GLUCONATE CLOTH 2 % EX PADS
6.0000 | MEDICATED_PAD | Freq: Once | CUTANEOUS | Status: AC
  Administered 2022-03-04: 6 via TOPICAL

## 2022-03-04 MED ORDER — OXYCODONE HCL 5 MG PO TABS: 5.0000 mg | ORAL_TABLET | Freq: Once | ORAL | Status: DC | PRN

## 2022-03-04 MED ORDER — PHENYLEPHRINE 40 MCG/ML (10ML) SYRINGE FOR IV PUSH (FOR BLOOD PRESSURE SUPPORT)
PREFILLED_SYRINGE | INTRAVENOUS | Status: DC | PRN
  Administered 2022-03-04 (×4): 40 ug via INTRAVENOUS

## 2022-03-04 MED ORDER — VITAMIN B-12 1000 MCG PO TABS: 1000.0000 ug | ORAL_TABLET | Freq: Every day | ORAL | Status: DC

## 2022-03-04 MED ORDER — FENTANYL CITRATE (PF) 250 MCG/5ML IJ SOLN
INTRAMUSCULAR | Status: AC
  Filled 2022-03-04: qty 5

## 2022-03-04 MED ORDER — PHENYLEPHRINE HCL-NACL 20-0.9 MG/250ML-% IV SOLN
INTRAVENOUS | Status: DC | PRN
  Administered 2022-03-04: 40 ug/min via INTRAVENOUS

## 2022-03-04 MED ORDER — ROCURONIUM BROMIDE 10 MG/ML (PF) SYRINGE
PREFILLED_SYRINGE | INTRAVENOUS | Status: AC
  Filled 2022-03-04: qty 10

## 2022-03-04 MED ORDER — ONDANSETRON HCL 4 MG/2ML IJ SOLN
INTRAMUSCULAR | Status: DC | PRN
  Administered 2022-03-04: 4 mg via INTRAVENOUS

## 2022-03-04 MED ORDER — MUPIROCIN 2 % EX OINT
1.0000 "application " | TOPICAL_OINTMENT | Freq: Two times a day (BID) | CUTANEOUS | Status: DC
  Administered 2022-03-04 – 2022-03-06 (×5): 1 via NASAL
  Filled 2022-03-04 (×2): qty 22

## 2022-03-04 MED ORDER — LIDOCAINE 2% (20 MG/ML) 5 ML SYRINGE
INTRAMUSCULAR | Status: DC | PRN
  Administered 2022-03-04: 60 mg via INTRAVENOUS

## 2022-03-04 MED ORDER — DEXAMETHASONE SODIUM PHOSPHATE 10 MG/ML IJ SOLN
INTRAMUSCULAR | Status: AC
  Filled 2022-03-04: qty 1

## 2022-03-04 MED ORDER — LIDOCAINE 2% (20 MG/ML) 5 ML SYRINGE
INTRAMUSCULAR | Status: AC
  Filled 2022-03-04: qty 5

## 2022-03-04 MED ORDER — LACTATED RINGERS IV SOLN: INTRAVENOUS | Status: DC

## 2022-03-04 MED ORDER — 0.9 % SODIUM CHLORIDE (POUR BTL) OPTIME
TOPICAL | Status: DC | PRN
  Administered 2022-03-04: 1000 mL

## 2022-03-04 MED ORDER — PHENYLEPHRINE 40 MCG/ML (10ML) SYRINGE FOR IV PUSH (FOR BLOOD PRESSURE SUPPORT)
PREFILLED_SYRINGE | INTRAVENOUS | Status: AC
  Filled 2022-03-04: qty 10

## 2022-03-04 MED ORDER — FENTANYL CITRATE (PF) 250 MCG/5ML IJ SOLN
INTRAMUSCULAR | Status: DC | PRN
  Administered 2022-03-04: 100 ug via INTRAVENOUS

## 2022-03-04 MED ORDER — CYANOCOBALAMIN 1000 MCG/ML IJ SOLN
1000.0000 ug | Freq: Every day | INTRAMUSCULAR | Status: DC
  Administered 2022-03-04 – 2022-03-06 (×3): 1000 ug via INTRAMUSCULAR
  Filled 2022-03-04 (×3): qty 1

## 2022-03-04 MED ORDER — OXYCODONE HCL 5 MG/5ML PO SOLN: 5.0000 mg | Freq: Once | ORAL | Status: DC | PRN

## 2022-03-04 MED ORDER — CHLORHEXIDINE GLUCONATE 0.12 % MT SOLN
OROMUCOSAL | Status: AC
  Administered 2022-03-04: 15 mL via OROMUCOSAL
  Filled 2022-03-04: qty 15

## 2022-03-04 MED ORDER — ORAL CARE MOUTH RINSE: 15.0000 mL | Freq: Once | OROMUCOSAL | Status: AC

## 2022-03-04 MED ORDER — SODIUM CHLORIDE 0.9 % IV SOLN
INTRAVENOUS | Status: DC | PRN
  Administered 2022-03-04: 500 mL

## 2022-03-04 MED ORDER — PROPOFOL 10 MG/ML IV BOLUS
INTRAVENOUS | Status: AC
  Filled 2022-03-04: qty 20

## 2022-03-04 MED ORDER — SUCCINYLCHOLINE CHLORIDE 200 MG/10ML IV SOSY
PREFILLED_SYRINGE | INTRAVENOUS | Status: AC
  Filled 2022-03-04: qty 10

## 2022-03-04 MED ORDER — ONDANSETRON HCL 4 MG/2ML IJ SOLN: 4.0000 mg | Freq: Four times a day (QID) | INTRAMUSCULAR | Status: DC | PRN

## 2022-03-04 MED ORDER — SODIUM CHLORIDE 0.9 % IV SOLN
Freq: Once | INTRAVENOUS | Status: DC
  Filled 2022-03-04 (×2): qty 10

## 2022-03-04 MED ORDER — PROPOFOL 10 MG/ML IV BOLUS
INTRAVENOUS | Status: DC | PRN
  Administered 2022-03-04: 100 mg via INTRAVENOUS

## 2022-03-04 MED ORDER — ONDANSETRON HCL 4 MG/2ML IJ SOLN
INTRAMUSCULAR | Status: AC
  Filled 2022-03-04: qty 2

## 2022-03-04 MED ORDER — INSULIN ASPART 100 UNIT/ML IJ SOLN: 0.0000 [IU] | INTRAMUSCULAR | Status: DC | PRN

## 2022-03-04 MED ORDER — ROCURONIUM BROMIDE 10 MG/ML (PF) SYRINGE
PREFILLED_SYRINGE | INTRAVENOUS | Status: DC | PRN
  Administered 2022-03-04: 50 mg via INTRAVENOUS

## 2022-03-04 SURGICAL SUPPLY — 40 items
BAG COUNTER SPONGE SURGICOUNT (BAG) ×2 IMPLANT
BAG DECANTER FOR FLEXI CONT (MISCELLANEOUS) ×1 IMPLANT
BENZOIN TINCTURE PRP APPL 2/3 (GAUZE/BANDAGES/DRESSINGS) ×2 IMPLANT
BINDER ABDOMINAL 12 XL 75-84 (SOFTGOODS) ×1 IMPLANT
CANISTER SUCT 3000ML PPV (MISCELLANEOUS) ×2 IMPLANT
CANISTER WOUNDNEG PRESSURE 500 (CANNISTER) ×1 IMPLANT
COVER SURGICAL LIGHT HANDLE (MISCELLANEOUS) ×2 IMPLANT
DRAIN CHANNEL 19F RND (DRAIN) ×1 IMPLANT
DRAPE ORTHO SPLIT 77X108 STRL (DRAPES) ×2
DRAPE SURG ORHT 6 SPLT 77X108 (DRAPES) IMPLANT
DRSG CUTIMED SORBACT 7X9 (GAUZE/BANDAGES/DRESSINGS) ×1 IMPLANT
DRSG HYDROCOLLOID 4X4 (GAUZE/BANDAGES/DRESSINGS) ×1 IMPLANT
DRSG PAD ABDOMINAL 8X10 ST (GAUZE/BANDAGES/DRESSINGS) ×2 IMPLANT
DRSG VAC ATS LRG SENSATRAC (GAUZE/BANDAGES/DRESSINGS) ×1 IMPLANT
ELECT REM PT RETURN 9FT ADLT (ELECTROSURGICAL) ×2
ELECTRODE REM PT RTRN 9FT ADLT (ELECTROSURGICAL) ×1 IMPLANT
EVACUATOR SILICONE 100CC (DRAIN) ×1 IMPLANT
GAUZE SPONGE 4X4 12PLY STRL (GAUZE/BANDAGES/DRESSINGS) ×1 IMPLANT
GLOVE SURG ENC MOIS LTX SZ6.5 (GLOVE) ×2 IMPLANT
GLOVE SURG ENC TEXT LTX SZ6.5 (GLOVE) ×2 IMPLANT
GOWN STRL REUS W/ TWL LRG LVL3 (GOWN DISPOSABLE) ×3 IMPLANT
GOWN STRL REUS W/TWL LRG LVL3 (GOWN DISPOSABLE) ×3
GRAFT MYRIAD 3 LAYER 7X10 (Graft) ×1 IMPLANT
KIT BASIN OR (CUSTOM PROCEDURE TRAY) ×2 IMPLANT
KIT TURNOVER KIT B (KITS) ×2 IMPLANT
NS IRRIG 1000ML POUR BTL (IV SOLUTION) ×2 IMPLANT
PACK GENERAL/GYN (CUSTOM PROCEDURE TRAY) ×2 IMPLANT
PAD ARMBOARD 7.5X6 YLW CONV (MISCELLANEOUS) ×3 IMPLANT
POWDER MYRIAD MORCELLS 1000MG (Miscellaneous) ×2 IMPLANT
SUT MNCRL AB 4-0 PS2 18 (SUTURE) ×3 IMPLANT
SUT MON AB 2-0 SH 27 (SUTURE) ×2
SUT MON AB 2-0 SH27 (SUTURE) IMPLANT
SUT MON AB 3-0 SH 27 (SUTURE) ×1
SUT MON AB 3-0 SH27 (SUTURE) IMPLANT
SUT SILK 3 0 SH 30 (SUTURE) ×1 IMPLANT
SUT VIC AB 5-0 PS2 18 (SUTURE) ×1 IMPLANT
SWAB COLLECTION DEVICE MRSA (MISCELLANEOUS) ×1 IMPLANT
SWAB CULTURE ESWAB REG 1ML (MISCELLANEOUS) ×1 IMPLANT
TOWEL GREEN STERILE (TOWEL DISPOSABLE) ×2 IMPLANT
UNDERPAD 30X36 HEAVY ABSORB (UNDERPADS AND DIAPERS) ×2 IMPLANT

## 2022-03-04 NOTE — Progress Notes (Signed)
? ?[facility] ?PROGRESS NOTE ? ? ?[name] [name] JJH:[id number] DOB: 1951-02-18 DOA: 03/02/2022  2 ?DOS: the patient was seen and examined on 03/04/2022 ? ?PCP: Virginia Crews, MD ? ?Brief History and Hospital Course:  ?71 y.o. female with medical history significant of diabetes mellitus x2, fibromyalgia, chronic systolic CHF, coronary artery disease with prior myocardial infarction, hypertension, hyperlipidemia, previous uterine cancer and recent panniculectomy for panniculitis cysts by Dr. Royce Macadamia on 02/05/22 who presented with wound dehiscence, foul purulent drainage, chills, fatigue, and abdominal discomfort.  Patient was hospitalized for further management.   ? ?Consultants: Plastic surgery ? ?Procedures:  ?4/2: ?Excision of abdominal wound 6 x 15 cm skin and soft tissue ?Partial closure of the wound 6 cm ?Placement of Myriad powder 2 gm and sheet 7 x 10 cm ?VAC placement ? ? ? ?Subjective: ?Patient seen after she came back from the OR.  Denies any pain issues currently.  No shortness of breath.  No chest pain.  Denies any fatigue. ? ? ? ?Assessment/Plan: ? ? ?* Abdominal wall cellulitis ?Patient recently underwent panniculectomy by plastic surgery.  Has experienced wound dehiscence and cellulitis of the abdominal wall.  CT scan did not show any obvious abscess.  Patient started on broad-spectrum antibiotics with vancomycin and Zosyn.  Follow-up on cultures.  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 ?Seen by plastic surgery this morning.  Underwent excision of the abdominal wound, partial closure of the wound and placement of wound VAC.   ?Noted to be afebrile.  WBC is normal. ? ?Wound dehiscence ?As per plastic surgery.  Underwent surgery this morning. ? ?Chronic systolic CHF (congestive heart failure), NYHA class 3 ([place]) ?There is an echocardiogram report from 2016 which showed that EF was 25 to 30% at that time.  She is followed by [facility] cardiology in [place].  Care everywhere was reviewed.  It  appears that her systolic function was noted to be normal when checked in August 2022.  Noted to be on carvedilol. ?Cardiac status is stable. ? ?Coronary artery disease ?History of stent placement previously.  Cardiac status appears to be stable.  Continue beta-blocker.  Not noted to be on any antiplatelet agents currently.  Noted to be on Zetia. ? ?Type 2 diabetes mellitus with complication, without long-term current use of insulin ([place]) ?HbA1c 7.4.  She is on metformin at home.  Continue SSI.  Monitor CBGs. ? ?Normocytic anemia ?Old labs reviewed.  Hemoglobin has been between 8 and 10 during most of this year.   ?Drop in hemoglobin during this hospital stay is likely dilutional.  No evidence of overt blood loss. ?Ferritin noted to be 38, iron 20, TIBC 242, percent saturation is 8.   ?Vitamin B12 level is borderline low at 234.  Folate is 10.8.  We will start B12 supplementation.   ?Continue to monitor hemoglobin.  Transfuse if it drops below 7.  ? ? ?Morbid obesity ?Estimated body mass index is 37.63 kg/m? as calculated from the following: ?  Height as of this encounter: 5' 7.01" (1.702 m). ?  Weight as of this encounter: 109 kg. ? ? ?DVT Prophylaxis: Will initiate subcutaneous heparin from tomorrow ?Code Status: Full code ?Family Communication: Discussed with the patient ?Disposition Plan: Home health recommended by physical therapy. ? ?Status is: Inpatient ?Remains inpatient appropriate because: Need for IV antibiotics and surgical intervention ? ? ? ? ?Medications: Scheduled: ? buPROPion  300 mg Oral Daily  ? carvedilol  6.25 mg Oral BID WC  ? ceFAZolin  1 g / gentamicin 80 mg in NS 500 mL surgical irrigation   Irrigation Once  ? DULoxetine  60 mg Oral Daily  ? ezetimibe  10 mg Oral Daily  ? insulin aspart  0-20 Units Subcutaneous TID WC  ? insulin aspart  0-5 Units Subcutaneous QHS  ? mirabegron ER  25 mg Oral Daily  ? pregabalin  75 mg Oral TID  ? sodium chloride flush  3 mL Intravenous Q12H  ? ?Continuous: ?  0.9 % NaCl with KCl 20 mEq / L 75 mL/hr at 03/03/22 1425  ? piperacillin-tazobactam (ZOSYN)  IV 3.375 g (03/04/22 6761)  ? vancomycin 750 mg (03/03/22 2321)  ? ?[id number] **OR** acetaminophen, morphine injection, ondansetron **OR** ondansetron (ZOFRAN) IV, oxyCODONE, polyethylene glycol, traZODone ? ?Antibiotics: ?Anti-infectives (From admission, onward)  ? ? Start     Dose/Rate Route Frequency Ordered Stop  ? 03/04/22 0832  ceFAZolin 1 g / gentamicin 80 mg in NS 500 mL surgical irrigation  Status:  Discontinued       ?   As needed 03/04/22 0832 03/04/22 0855  ? 03/04/22 0800  ceFAZolin 1 g / gentamicin 80 mg in NS 500 mL surgical irrigation       ?  Irrigation  Once 03/04/22 0754    ? 03/03/22 1000  vancomycin (VANCOREADY) IVPB 750 mg/150 mL       ? 750 mg ?150 mL/hr over 60 Minutes Intravenous Every 12 hours 03/02/22 1943    ? 03/03/22 0200  piperacillin-tazobactam (ZOSYN) IVPB 3.375 g       ? 3.375 g ?12.5 mL/hr over 240 Minutes Intravenous Every 8 hours 03/02/22 1943    ? 03/02/22 1930  piperacillin-tazobactam (ZOSYN) IVPB 3.375 g       ? 3.375 g ?100 mL/hr over 30 Minutes Intravenous  Once 03/02/22 1924 03/02/22 2008  ? 03/02/22 1930  vancomycin (VANCOREADY) IVPB 1750 mg/350 mL       ? 1,750 mg ?175 mL/hr over 120 Minutes Intravenous  Once 03/02/22 1924 03/02/22 2238  ? ?  ? ? ?Objective: ? ?Vital Signs ? ?Vitals:  ? 03/04/22 8338 03/04/22 0855 03/04/22 0910 03/04/22 0921  ?BP:  126/81 (!) 105/54 (!) 115/100  ?Pulse:  78 78 68  ?Resp:  '14 19 17  ' ?Temp:  98.4 ?F (36.9 ?C)  98.4 ?F (36.9 ?C)  ?TempSrc:      ?SpO2: 96% 94% 100% 99%  ?Weight:      ?Height:      ? ? ?Intake/Output Summary (Last 24 hours) at 03/04/2022 1026 ?Last data filed at 03/04/2022 0827 ?Gross per 24 hour  ?Intake 1584.79 ml  ?Output 1530 ml  ?Net 54.79 ml  ? ?Filed Weights  ? 03/04/22 0719  ?Weight: 109 kg  ? ?General appearance: Awake alert.  In no distress ?Resp: Clear to auscultation bilaterally.  Normal effort ?Cardio: S1-S2 is normal  regular.  No S3-S4.  No rubs murmurs or bruit ?GI: Abdominal binder noted covering the abdomen.  Defer exam to plastic surgery. ?Extremities: No edema.   ?Neurologic: Alert and oriented x3.  No focal neurological deficits.  ? ? ?Lab Results: ? ?Data Reviewed: I have personally reviewed labs and imaging study reports ? ?CBC: ?Recent Labs  ?Lab 03/02/22 ?1350 03/03/22 ?0646 03/04/22 ?0107  ?WBC 6.8 4.7 5.0  ?NEUTROABS 5.0  --   --   ?HGB 9.5* 7.7* 7.6*  ?HCT 31.1* 25.7* 25.3*  ?MCV 84.3 83.7 83.8  ?PLT 498* 405* 405*  ? ? ?Basic Metabolic Panel: ?  Recent Labs  ?Lab 03/02/22 ?1350 03/03/22 ?0646 03/04/22 ?0107  ?NA 135 139 136  ?K 3.8 3.9 4.0  ?CL 100 105 105  ?CO2 '23 26 25  ' ?GLUCOSE 256* 136* 112*  ?BUN '15 11 13  ' ?CREATININE 0.77 0.77 0.68  ?CALCIUM 8.9 8.2* 8.1*  ? ? ?GFR: ?Estimated Creatinine Clearance: 85.6 mL/min (by C-G formula based on SCr of 0.68 mg/dL). ? ?Liver Function Tests: ?Recent Labs  ?Lab 03/03/22 ?9432  ?AST 12*  ?ALT 11  ?ALKPHOS 55  ?BILITOT 0.4  ?PROT 5.9*  ?ALBUMIN 1.9*  ? ? ? ?HbA1C: ?Recent Labs  ?  03/03/22 ?0646  ?HGBA1C 7.4*  ? ? ?CBG: ?Recent Labs  ?Lab 03/03/22 ?2152 03/03/22 ?2303 03/04/22 ?0409 03/04/22 ?7614 03/04/22 ?7092  ?GLUCAP 113* 103* 106* 118* 108*  ? ? ? ?Anemia Panel: ?Recent Labs  ?  03/03/22 ?0646 03/04/22 ?0107  ?[id number]  --  234  ?FOLATE  --  10.8  ?FERRITIN 38  --   ?TIBC 242*  --   ?IRON 20*  --   ?RETICCTPCT 2.6  --   ? ? ?Recent Results (from the past 240 hour(s))  ?Blood culture (routine x 2)     Status: None (Preliminary result)  ? Collection Time: 03/02/22  5:13 PM  ? Specimen: BLOOD  ?Result Value Ref Range Status  ? Specimen Description BLOOD BLOOD RIGHT FOREARM  Final  ? Special Requests   Final  ?  BOTTLES DRAWN AEROBIC AND ANAEROBIC Blood Culture results may not be optimal due to an inadequate volume of blood received in culture bottles  ? Culture   Final  ?  NO GROWTH 2 DAYS ?Performed at [facility], [place] [address], [place], [place] [address] ?  ?  Report Status PENDING  Incomplete  ?Aerobic/Anaerobic Culture w Gram Stain (surgical/deep wound)     Status: None (Preliminary result)  ? Collection Time: 03/03/22 12:03 AM  ? Specimen: Abdomen; Abscess

## 2022-03-04 NOTE — Op Note (Addendum)
DATE OF OPERATION: 03/04/2022 ? ?LOCATION:  Main Operating Room Inpatient ? ?PREOPERATIVE DIAGNOSIS: Abdominal wound after panniculectomy ? ?POSTOPERATIVE DIAGNOSIS: Same ? ?PROCEDURE:  ?Excision of abdominal wound 6 x 15 cm skin and soft tissue ?Partial closure of the wound 6 cm ?Placement of Myriad powder 2 gm and sheet 7 x 10 cm ?VAC placement ? ?SURGEON:  Sanger , DO ? ?EBL: 2 cc ? ?CONDITION: Stable ? ?COMPLICATIONS: None ? ?INDICATION: The patient, [name] [name], is a 70 y.o. female born on 09/27/1951, is here for treatment of an abdominal wound after a panniculectomy.  The patient removed her VAC POD #1 and then her drain.  She had a fall and was admitted.  ? ?PROCEDURE DETAILS:  ?The patient was seen prior to surgery and marked.  The IV antibiotics were given. The patient was taken to the operating room and given a general anesthetic. A standard time out was performed and all information was confirmed by those in the room. SCDs were placed.  The abdomen was prepped and draped.  The #10 blade was used to excise the skin and soft tissue of the 6 x 15 cm wound.  Hemostasis was achieved with electrocautery.  The area was irrigated with saline. Cultures were obtained.  The area was then irrigated with antibiotic solution.  All of the myriad powder and sheet was applied and secured with the 5-0 Vicryl.  The sorbact was placed and secured with the Vicryl.  The central portion of the wound was loosely closed with the 2-0 and 3-0 Monocryl with vertical mattress of the 6 cm wound. The VAC was applied and there was an excellent seal. There was no fluid pocket and no purulence noted. The patient was allowed to wake up and taken to recovery room in stable condition at the end of the case. The family was notified at the end of the case.  ? ?

## 2022-03-04 NOTE — Progress Notes (Signed)
Jewelry consisting of three rings silver in color, upper dentures, and glasses removed. Belongings placed in a bag and kept at bedside per pt's request prior to going to OR. Pt informed and educated on policy for personal belongings. Pt continues to keep belongings at bedside.  ?

## 2022-03-04 NOTE — Transfer of Care (Signed)
Immediate Anesthesia Transfer of Care Note ? ?Patient: JOLEIGH MINEAU ? ?Procedure(s) Performed: IRRIGATION AND DEBRIDEMENT ABDOMEN WITH VAC PLACEMENT, MYRIAD PLACEMENT (Abdomen) ? ?Patient Location: PACU ? ?Anesthesia Type:General ? ?Level of Consciousness: awake, alert , oriented and patient cooperative ? ?Airway & Oxygen Therapy: Patient Spontanous Breathing ? ?Post-op Assessment: Report given to RN and Post -op Vital signs reviewed and stable ? ?Post vital signs: Reviewed and stable ? ?Last Vitals:  ?Vitals Value Taken Time  ?BP 126/81 03/04/22 0855  ?Temp    ?Pulse 76 03/04/22 0856  ?Resp 16 03/04/22 0856  ?SpO2 100 % 03/04/22 0856  ?Vitals shown include unvalidated device data. ? ?Last Pain:  ?Vitals:  ? 03/04/22 0712  ?TempSrc: Oral  ?PainSc:   ?   ? ?  ? ?Complications: No notable events documented. ?

## 2022-03-04 NOTE — Anesthesia Postprocedure Evaluation (Signed)
Anesthesia Post Note ? ?Patient: NALLA PURDY ? ?Procedure(s) Performed: IRRIGATION AND DEBRIDEMENT ABDOMEN WITH VAC PLACEMENT, MYRIAD PLACEMENT (Abdomen) ? ?  ? ?Patient location during evaluation: PACU ?Anesthesia Type: General ?Level of consciousness: awake and alert ?Pain management: pain level controlled ?Vital Signs Assessment: post-procedure vital signs reviewed and stable ?Respiratory status: spontaneous breathing, nonlabored ventilation, respiratory function stable and patient connected to nasal cannula oxygen ?Cardiovascular status: blood pressure returned to baseline and stable ?Postop Assessment: no apparent nausea or vomiting ?Anesthetic complications: no ? ? ?No notable events documented. ? ?Last Vitals:  ?Vitals:  ? 03/04/22 1220 03/04/22 1608  ?BP:  130/65  ?Pulse:  88  ?Resp:  18  ?Temp: 36.8 ?C 36.6 ?C  ?SpO2:    ?  ?Last Pain:  ?Vitals:  ? 03/04/22 1608  ?TempSrc: Oral  ?PainSc:   ? ? ?  ?  ?  ?  ?  ?  ? ?[facility] ? ? ? ? ?

## 2022-03-04 NOTE — Anesthesia Preprocedure Evaluation (Signed)
Anesthesia Evaluation  ?Patient identified by MRN, date of birth, ID band ?Patient awake ? ? ? ?Reviewed: ?Allergy & Precautions, H&P , NPO status , Patient's Chart, lab work & pertinent test results ? ?Airway ?Mallampati: II ? ? ?Neck ROM: full ? ? ? Dental ?  ?Pulmonary ?former smoker,  ?  ?breath sounds clear to auscultation ? ? ? ? ? ? Cardiovascular ?hypertension, + CAD, + Past MI, + Cardiac Stents, + Peripheral Vascular Disease and +CHF  ? ?Rhythm:regular Rate:Normal ? ?EF 30% ?  ?Neuro/Psych ? Headaches, PSYCHIATRIC DISORDERS Anxiety Depression  Neuromuscular disease   ? GI/Hepatic ?  ?Endo/Other  ?diabetes, Type 2obese ? Renal/GU ?  ? ?  ?Musculoskeletal ? ?(+) Arthritis , Fibromyalgia - ? Abdominal ?  ?Peds ? Hematology ? ?(+) Blood dyscrasia, anemia ,   ?Anesthesia Other Findings ? ? Reproductive/Obstetrics ?H/o uterine CA ? ?  ? ? ? ? ? ? ? ? ? ? ? ? ? ?  ?  ? ? ? ? ? ? ? ? ?Anesthesia Physical ?Anesthesia Plan ? ?ASA: 3 ? ?Anesthesia Plan: General  ? ?Post-op Pain Management:   ? ?Induction: Intravenous ? ?PONV Risk Score and Plan: 3 and Ondansetron, Dexamethasone and Treatment may vary due to age or medical condition ? ?Airway Management Planned: Oral ETT ? ?Additional Equipment:  ? ?Intra-op Plan:  ? ?Post-operative Plan: Extubation in OR ? ?Informed Consent: I have reviewed the patients History and Physical, chart, labs and discussed the procedure including the risks, benefits and alternatives for the proposed anesthesia with the patient or authorized representative who has indicated his/her understanding and acceptance.  ? ? ? ?Dental advisory given ? ?Plan Discussed with: CRNA, Anesthesiologist and Surgeon ? ?Anesthesia Plan Comments:   ? ? ? ? ? ? ?Anesthesia Quick Evaluation ? ?

## 2022-03-04 NOTE — H&P (Signed)
[name] [name] is an 71 y.o. female.   ?Chief Complaint: abdominal wound ?HPI: The patient is a 71 yrs old female here for treatment of her abdominal wound.  She underwent a panniculectomy last month.  She removed her dressing post op day one.  She then accidentally pulled out the drains.  Her protein is low and she fell at home two weeks ago.  This required an admission to the hospital.  She went to rehab and then returned to the ED with concerns about the incision wound. ? ?Past Medical History:  ?Diagnosis Date  ? Anxiety   ? Arthritis   ? Rhumetoid arthritis  ? BRCA negative 07/2017  ? MyRisk neg  ? Cancer [facility])   ? uterine  ? Cellulitis of both lower extremities   ? Chronic  ? CHF (congestive heart failure) ([place])   ? Coronary artery disease   ? Depression   ? Diabetes mellitus without complication ([place])   ? Family history of breast cancer 07/2017  ? MyRisk neg; IBIS=10.5%/riskscore=17%  ? Family history of ovarian cancer   ? Fibromyalgia   ? Fibromyalgia affecting shoulder region   ? Headache   ? Hyperlipidemia   ? Hypertension   ? Spinal stenosis   ? ? ?Past Surgical History:  ?Procedure Laterality Date  ? ABLATION SAPHENOUS VEIN W/ RFA    ? ANAL FISSURECTOMY  01/2004  ? COLON SURGERY    ? CORONARY ANGIOPLASTY    ? CYSTOSCOPY  09/03/2017  ? Procedure: CYSTOSCOPY;  Surgeon: Gae Dry, MD;  Location: [facility];  Service: Gynecology;;  ? DILATION AND CURETTAGE OF UTERUS    ? ESOPHAGEAL DILATION  02/20/2022  ? Procedure: ESOPHAGEAL DILATION;  Surgeon: Carol Ada, MD;  Location: [place];  Service: Gastroenterology;;  savory  ? ESOPHAGOGASTRODUODENOSCOPY (EGD) WITH PROPOFOL N/A 02/20/2022  ? Procedure: ESOPHAGOGASTRODUODENOSCOPY (EGD) WITH PROPOFOL;  Surgeon: Carol Ada, MD;  Location: [place];  Service: Gastroenterology;  Laterality: N/A;  Dyspahgia  ? EYE SURGERY Bilateral 08/2020  ? eye lift  ? HERNIA REPAIR    ? LAPAROSCOPIC HYSTERECTOMY N/A 09/03/2017  ? Procedure: HYSTERECTOMY TOTAL  LAPAROSCOPIC BSO;  Surgeon: Gae Dry, MD;  Location: [facility];  Service: Gynecology;  Laterality: N/A;  ? MEDIAL PARTIAL KNEE REPLACEMENT Left 01/06/2014  ? PANNICULECTOMY N/A 02/05/2022  ? Procedure: PANNICULECTOMY;  Surgeon: Wallace Going, DO;  Location: [place];  Service: Plastics;  Laterality: N/A;  3 hours  ? UMBILICAL HERNIA REPAIR N/A 09/05/2015  ? Procedure: HERNIA REPAIR incarcerated UMBILICAL ADULT;  Surgeon: Sherri Rad, MD;  Location: [facility];  Service: General;  Laterality: N/A;  ? Vascular Stent  11/04/2013  ? ? ?Family History  ?Problem Relation Age of Onset  ? Hypertension Mother   ? Cataracts Mother   ? Thyroid disease Mother   ? Dementia Mother   ? COPD Father   ? Dementia Father   ? Cataracts Father   ? Aneurysm Father   ?     Abdominal & Brain  ? Diabetes Father   ? Melanoma Father   ? Breast cancer Sister 71  ? Fibromyalgia Sister   ? Migraines Sister   ? Hypertension Sister   ? Breast cancer Sister 21  ? COPD Sister   ? Ovarian cancer Sister 74  ? Migraines Brother   ? Heart attack Brother   ? Colon cancer Paternal Uncle   ?     29s  ? Colon cancer Paternal Uncle   ?  32s  ? Uterine cancer Cousin   ? Uterine cancer Cousin   ? ?Social History:  reports that she quit smoking about 25 years ago. Her smoking use included cigarettes. She has a 29.00 pack-year smoking history. She has never used smokeless tobacco. She reports that she does not drink alcohol and does not use drugs. ? ?Allergies:  ?Allergies  ?Allergen Reactions  ? Sulfa Antibiotics Itching, Rash and Swelling  ? Latex Itching  ? Doxycycline Rash  ? Empagliflozin Itching and Other (See Comments)  ?  Itching and frequent yeast infections  ? ? ?Medications Prior to Admission  ?Medication Sig Dispense Refill  ? acetaminophen (TYLENOL) 325 MG tablet Take 650 mg by mouth every 6 (six) hours as needed for moderate pain.    ? Biotin w/ Vitamins C & E (HAIR/SKIN/NAILS PO) Take 1 tablet by mouth daily.    ? buPROPion (WELLBUTRIN XL)  300 MG 24 hr tablet TAKE 1 TABLET BY MOUTH EVERY DAY (Patient taking differently: Take 300 mg by mouth daily.) 90 tablet 1  ? carvedilol (COREG) 6.25 MG tablet Take 1 tablet (6.25 mg total) by mouth 2 (two) times daily with a meal. 60 tablet 3  ? DULoxetine (CYMBALTA) 60 MG capsule TAKE 1 CAPSULE BY MOUTH EVERY DAY (Patient taking differently: Take 60 mg by mouth daily.) 90 capsule 0  ? ezetimibe (ZETIA) 10 MG tablet TAKE 1 TABLET BY MOUTH EVERY DAY (Patient taking differently: Take 10 mg by mouth daily.) 90 tablet 3  ? metFORMIN (GLUCOPHAGE) 500 MG tablet TAKE 1 TABLET BY MOUTH 2 TIMES DAILY WITH A MEAL. (Patient taking differently: Take 500 mg by mouth 2 (two) times daily with a meal.) 180 tablet 1  ? pregabalin (LYRICA) 75 MG capsule Take 1 capsule (75 mg total) by mouth 3 (three) times daily. 90 capsule 1  ? Vibegron (GEMTESA) 75 MG TABS Take 75 mg by mouth daily. 90 tablet 3  ? amoxicillin-clavulanate (AUGMENTIN) 875-125 MG tablet Take 1 tablet by mouth every 12 (twelve) hours. (Patient not taking: Reported on 03/02/2022) 10 tablet 0  ? clonazePAM (KLONOPIN) 0.5 MG tablet Take 0.5-1 tablets (0.25-0.5 mg total) by mouth 2 (two) times daily as needed for anxiety. (Patient not taking: Reported on 03/02/2022) 30 tablet 0  ? famotidine (PEPCID) 20 MG tablet Take 1 tablet (20 mg total) by mouth 2 (two) times daily. (Patient not taking: Reported on 03/02/2022) 60 tablet 0  ? ondansetron (ZOFRAN-ODT) 4 MG disintegrating tablet Take 1 tablet (4 mg total) by mouth every 8 (eight) hours as needed for nausea or vomiting. (Patient not taking: Reported on 03/02/2022) 20 tablet 0  ? tolterodine (DETROL LA) 4 MG 24 hr capsule Take 1 capsule (4 mg total) by mouth daily. (Patient not taking: Reported on 03/02/2022) 90 capsule 3  ? ? ?Results for orders placed or performed during the hospital encounter of 03/02/22 (from the past 48 hour(s))  ?CBC with Differential     Status: Abnormal  ? Collection Time: 03/02/22  1:50 PM  ?Result  Value Ref Range  ? WBC 6.8 4.0 - 10.5 K/uL  ? RBC 3.69 (L) 3.87 - 5.11 MIL/uL  ? Hemoglobin 9.5 (L) 12.0 - 15.0 g/dL  ? HCT 31.1 (L) 36.0 - 46.0 %  ? MCV 84.3 80.0 - 100.0 fL  ? MCH 25.7 (L) 26.0 - 34.0 pg  ? MCHC 30.5 30.0 - 36.0 g/dL  ? RDW 14.8 11.5 - 15.5 %  ? Platelets 498 (H) 150 - 400 K/uL  ?  nRBC 0.0 0.0 - 0.2 %  ? Neutrophils Relative % 72 %  ? Neutro Abs 5.0 1.7 - 7.7 K/uL  ? Lymphocytes Relative 18 %  ? Lymphs Abs 1.2 0.7 - 4.0 K/uL  ? Monocytes Relative 6 %  ? Monocytes Absolute 0.4 0.1 - 1.0 K/uL  ? Eosinophils Relative 3 %  ? Eosinophils Absolute 0.2 0.0 - 0.5 K/uL  ? Basophils Relative 1 %  ? Basophils Absolute 0.1 0.0 - 0.1 K/uL  ? Immature Granulocytes 0 %  ? Abs Immature Granulocytes 0.02 0.00 - 0.07 K/uL  ?  Comment: Performed at [facility], [place] [address] [place], [place] [address]  ?Basic metabolic panel     Status: Abnormal  ? Collection Time: 03/02/22  1:50 PM  ?Result Value Ref Range  ? Sodium 135 135 - 145 mmol/L  ? Potassium 3.8 3.5 - 5.1 mmol/L  ? Chloride 100 98 - 111 mmol/L  ? CO2 23 22 - 32 mmol/L  ? Glucose, Bld 256 (H) 70 - 99 mg/dL  ?  Comment: Glucose reference range applies only to samples taken after fasting for at least 8 hours.  ? BUN 15 8 - 23 mg/dL  ? Creatinine, Ser 0.77 0.44 - 1.00 mg/dL  ? Calcium 8.9 8.9 - 10.3 mg/dL  ? GFR, Estimated >60 >60 mL/min  ?  Comment: (NOTE) ?Calculated using the CKD-EPI Creatinine Equation (2021) ?  ? Anion gap 12 5 - 15  ?  Comment: Performed at [facility], [place] [address] [place], [place] [address]  ?Lactic acid, plasma     Status: Abnormal  ? Collection Time: 03/02/22  1:50 PM  ?Result Value Ref Range  ? Lactic Acid, Venous 3.1 (HH) 0.5 - 1.9 mmol/L  ?  Comment: CRITICAL RESULT CALLED TO, READ BACK BY AND VERIFIED WITH: ?ROWE,C RN @ 3875 03/02/22 LEONARD,A ?Performed at [facility], [place] [address] [place], [place] [address] ?  ?Lactic acid, plasma     Status: Abnormal  ? Collection Time: 03/02/22  4:00 PM  ?Result  Value Ref Range  ? Lactic Acid, Venous 2.5 (HH) 0.5 - 1.9 mmol/L  ?  Comment: CRITICAL VALUE NOTED.  VALUE IS CONSISTENT WITH PREVIOUSLY REPORTED AND CALLED VALUE. ?Performed at [facility], [place]

## 2022-03-04 NOTE — Anesthesia Procedure Notes (Signed)
Procedure Name: Intubation ?Date/Time: 03/04/2022 7:50 AM ?Performed by: Sammie Bench, CRNA ?Pre-anesthesia Checklist: Patient identified, Emergency Drugs available, Suction available and Patient being monitored ?Patient Re-evaluated:Patient Re-evaluated prior to induction ?Oxygen Delivery Method: Circle System Utilized ?Preoxygenation: Pre-oxygenation with 100% oxygen ?Induction Type: IV induction ?Ventilation: Mask ventilation without difficulty ?Laryngoscope Size: Mac and 3 ?Grade View: Grade I ?Tube type: Oral ?Number of attempts: 1 ?Airway Equipment and Method: Stylet and Oral airway ?Placement Confirmation: ETT inserted through vocal cords under direct vision, positive ETCO2 and breath sounds checked- equal and bilateral ?Secured at: 21 cm ?Tube secured with: Tape ?Dental Injury: Teeth and Oropharynx as per pre-operative assessment  ? ? ? ? ?

## 2022-03-05 ENCOUNTER — Other Ambulatory Visit (INDEPENDENT_AMBULATORY_CARE_PROVIDER_SITE_OTHER): Payer: Medicare Other | Admitting: Family Medicine

## 2022-03-05 ENCOUNTER — Encounter (HOSPITAL_COMMUNITY): Payer: Self-pay | Admitting: Plastic Surgery

## 2022-03-05 DIAGNOSIS — M069 Rheumatoid arthritis, unspecified: Secondary | ICD-10-CM | POA: Diagnosis not present

## 2022-03-05 DIAGNOSIS — I2510 Atherosclerotic heart disease of native coronary artery without angina pectoris: Secondary | ICD-10-CM

## 2022-03-05 DIAGNOSIS — E785 Hyperlipidemia, unspecified: Secondary | ICD-10-CM

## 2022-03-05 DIAGNOSIS — F32A Depression, unspecified: Secondary | ICD-10-CM

## 2022-03-05 DIAGNOSIS — G894 Chronic pain syndrome: Secondary | ICD-10-CM

## 2022-03-05 DIAGNOSIS — M545 Low back pain, unspecified: Secondary | ICD-10-CM

## 2022-03-05 DIAGNOSIS — E871 Hypo-osmolality and hyponatremia: Secondary | ICD-10-CM | POA: Diagnosis not present

## 2022-03-05 DIAGNOSIS — T8149XD Infection following a procedure, other surgical site, subsequent encounter: Secondary | ICD-10-CM | POA: Diagnosis not present

## 2022-03-05 DIAGNOSIS — Z9181 History of falling: Secondary | ICD-10-CM

## 2022-03-05 DIAGNOSIS — I509 Heart failure, unspecified: Secondary | ICD-10-CM

## 2022-03-05 DIAGNOSIS — M48061 Spinal stenosis, lumbar region without neurogenic claudication: Secondary | ICD-10-CM

## 2022-03-05 DIAGNOSIS — M797 Fibromyalgia: Secondary | ICD-10-CM

## 2022-03-05 DIAGNOSIS — E1169 Type 2 diabetes mellitus with other specified complication: Secondary | ICD-10-CM | POA: Diagnosis not present

## 2022-03-05 DIAGNOSIS — G8929 Other chronic pain: Secondary | ICD-10-CM

## 2022-03-05 DIAGNOSIS — I110 Hypertensive heart disease with heart failure: Secondary | ICD-10-CM | POA: Diagnosis not present

## 2022-03-05 DIAGNOSIS — F419 Anxiety disorder, unspecified: Secondary | ICD-10-CM

## 2022-03-05 LAB — BASIC METABOLIC PANEL
Anion gap: 8 (ref 5–15)
BUN: 19 mg/dL (ref 8–23)
CO2: 22 mmol/L (ref 22–32)
Calcium: 8.1 mg/dL — ABNORMAL LOW (ref 8.9–10.3)
Chloride: 102 mmol/L (ref 98–111)
Creatinine, Ser: 0.87 mg/dL (ref 0.44–1.00)
GFR, Estimated: >60 mL/min (ref >60)
Glucose, Bld: 194 mg/dL — ABNORMAL HIGH (ref 70–99)
Potassium: 4.1 mmol/L (ref 3.5–5.1)
Sodium: 132 mmol/L — ABNORMAL LOW (ref 135–145)

## 2022-03-05 LAB — CBC
HCT: 28.2 % — ABNORMAL LOW (ref 36.0–46.0)
Hemoglobin: 8.8 g/dL — ABNORMAL LOW (ref 12.0–15.0)
MCH: 25.6 pg — ABNORMAL LOW (ref 26.0–34.0)
MCHC: 31.2 g/dL (ref 30.0–36.0)
MCV: 82 fL (ref 80.0–100.0)
Platelets: 577 10*3/uL — ABNORMAL HIGH (ref 150–400)
RBC: 3.44 MIL/uL — ABNORMAL LOW (ref 3.87–5.11)
RDW: 14.6 % (ref 11.5–15.5)
WBC: 6.2 10*3/uL (ref 4.0–10.5)
nRBC: 0 % (ref 0.0–0.2)

## 2022-03-05 LAB — GLUCOSE, CAPILLARY
Glucose-Capillary: 122 mg/dL — ABNORMAL HIGH (ref 70–99)
Glucose-Capillary: 137 mg/dL — ABNORMAL HIGH (ref 70–99)
Glucose-Capillary: 168 mg/dL — ABNORMAL HIGH (ref 70–99)
Glucose-Capillary: 185 mg/dL — ABNORMAL HIGH (ref 70–99)
Glucose-Capillary: 93 mg/dL (ref 70–99)
Glucose-Capillary: 97 mg/dL (ref 70–99)

## 2022-03-05 MED ORDER — METRONIDAZOLE 500 MG/100ML IV SOLN
500.0000 mg | Freq: Two times a day (BID) | INTRAVENOUS | Status: DC
  Administered 2022-03-05 – 2022-03-06 (×2): 500 mg via INTRAVENOUS
  Filled 2022-03-05 (×2): qty 100

## 2022-03-05 MED ORDER — SODIUM CHLORIDE 0.9 % IV SOLN
2.0000 g | Freq: Three times a day (TID) | INTRAVENOUS | Status: DC
  Administered 2022-03-05 – 2022-03-06 (×3): 2 g via INTRAVENOUS
  Filled 2022-03-05 (×3): qty 2

## 2022-03-05 MED ORDER — DULOXETINE HCL 60 MG PO CPEP: 60.0000 mg | ORAL_CAPSULE | Freq: Every day | ORAL | 1 refills | Status: DC

## 2022-03-05 MED ORDER — PREGABALIN 75 MG PO CAPS: 75.0000 mg | ORAL_CAPSULE | Freq: Three times a day (TID) | ORAL | 1 refills | Status: DC

## 2022-03-05 MED ORDER — HEPARIN SODIUM (PORCINE) 5000 UNIT/ML IJ SOLN
5000.0000 [IU] | Freq: Three times a day (TID) | INTRAMUSCULAR | Status: DC
  Administered 2022-03-05 – 2022-03-06 (×3): 5000 [IU] via SUBCUTANEOUS
  Filled 2022-03-05 (×3): qty 1

## 2022-03-05 NOTE — Progress Notes (Signed)
Pt care plan complete. Pt has denied pain. Ambulating to and from bathroom independently. Med compliant. Vitals stable  ?

## 2022-03-05 NOTE — Progress Notes (Signed)
Received home health orders orders from [facility]. ?Start of care 02/26/22.   Certification and orders from 02/26/22 through 04/26/22 are reviewed, signed and faxed back to home health company. ? ? ?Patient was discharged from the hospital on 02/23/2022.  I reviewed her admission and discharge information in her chart and certify that she is a good candidate for home health services as ordered at discharge.  Her main admission problems that warrant home health services are postop infection, hyponatremia, esophageal stricture, hypoalbuminemia, anemia associated with acute blood loss, diabetes, hypokalemia. ? ?Patient is unsafe to leave home independently. ? ?Patient is receiving home health services for the following diagnoses: ? ?Problem List Items Addressed This Visit   ? ?  ? Cardiovascular and Mediastinum  ? Atherosclerosis of coronary artery  ?  ? Endocrine  ? Hyperlipidemia associated with type 2 diabetes mellitus ([place])  ?  ? Other  ? Chronic pain syndrome (Chronic)  ? Fibromyalgia  ? ?Other Visit Diagnoses   ? ? Infection following a procedure, other surgical site, subsequent encounter    -  Primary  ? Type 2 diabetes mellitus with other specified complication, without long-term current use of insulin ([place])      ? Hypertensive heart disease with congestive heart failure, unspecified heart failure type ([place])      ? Heart failure, unspecified HF chronicity, unspecified heart failure type ([place])      ? Hyposmolality syndrome      ? Rheumatoid arthritis involving multiple sites, unspecified whether rheumatoid factor present ([place])      ? Chronic low back pain without sciatica, unspecified back pain laterality      ? Spinal stenosis, lumbar region, without neurogenic claudication      ? Depressive disorder      ? Anxiety      ? Personal history of fall      ? ?  ? ?

## 2022-03-05 NOTE — Telephone Encounter (Signed)
Please see other note. This is being taken care of.  ?

## 2022-03-05 NOTE — Telephone Encounter (Signed)
Spoke to Dr. Marla Roe. I contacted Dr. Smitty Cords and gave him Dr. Eusebio Friendly number, at her request, with CT results. He conveyed understanding.  ?

## 2022-03-05 NOTE — TOC Initial Note (Signed)
Transition of Care (TOC) - Initial/Assessment Note  ? ? ?Patient Details  ?Name: [name] [name] ?MRN: [id number] ?Date of Birth: 11/29/1951 ? ?Transition of Care (TOC) CM/SW Contact:    ?Cyndi Bender, RN ?Phone Number: ?03/05/2022, 1:01 PM ? ?Clinical Narrative:                 ?Spoke to patient regarding transition needs. ?Patient currently active with [facility] HH-RN/PT.  ?[name] with [facility] notified of possible discharge tomorrow.  ?Spoke to [place] with [facility] and ordered wound VAC. Linus Orn will e-mail MD to sign order. ? ?Need Home health resumption orders RN/PT ? ?Expected Discharge Plan: [place] ?Barriers to Discharge: Continued Medical Work up ? ? ?Patient Goals and CMS Choice ?Patient states their goals for this hospitalization and ongoing recovery are:: return home ?CMS Medicare.gov Compare Post Acute Care list provided to:: Patient ?Choice offered to / list presented to : Patient ? ?Expected Discharge Plan and Services ?Expected Discharge Plan: [place] ?  ?Discharge Planning Services: CM Consult ?Post Acute Care Choice: Home Health, Durable Medical Equipment ?Living arrangements for the past 2 months: [place] ?                ?DME Arranged: Vac ?DME Agency: [employer] ?Date DME Agency Contacted: 03/05/22 ?Time DME Agency Contacted: [phone] ?Representative spoke with at DME Agency: Linus Orn ?HH Arranged: RN ?[place] Agency: [facility] ?  ?  ?  ? ?Prior Living Arrangements/Services ?Living arrangements for the past 2 months: [place] ?Lives with:: Self ?Patient language and need for interpreter reviewed:: Yes ?Do you feel safe going back to the place where you live?: Yes      ?Need for Family Participation in Patient Care: Yes (Comment) ?Care giver support system in place?: Yes (comment) ?Current home services: DME ?Criminal Activity/Legal Involvement Pertinent to Current Situation/Hospitalization: No - Comment as needed ? ?Activities of Daily Living ?  ?   ? ?Permission Sought/Granted ?Permission sought to share information with : Case Manager ?  ?   ? Permission granted to share info w AGENCY: hh ?   ?   ? ?Emotional Assessment ?Appearance:: Appears stated age ?Attitude/Demeanor/Rapport: Engaged ?Affect (typically observed): Accepting ?Orientation: : Oriented to Place, Oriented to Self, Oriented to  Time, Oriented to Situation ?Alcohol / Substance Use: Not Applicable ?Psych Involvement: No (comment) ? ?Admission diagnosis:  Cellulitis of abdominal wall [L03.311] ?Wound dehiscence [T81.30XA] ?Abdominal wall abscess at site of surgical wound [T81.49XA] ?Patient Active Problem List  ? Diagnosis Date Noted  ? Normocytic anemia 03/03/2022  ? Abdominal wall cellulitis 03/03/2022  ? Wound dehiscence 03/03/2022  ? 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 Hyponatremia 02/15/2022  ? Hypoalbuminemia 02/15/2022  ? Post op infection 02/15/2022  ?  Class: Question of  ? Anemia associated with acute blood loss 02/15/2022  ? Hypokalemia 02/15/2022  ? Hypotension 02/15/2022  ? Right arm pain 10/19/2021  ? Housing instability 03/28/2021  ? Dermatochalasis of both upper eyelids 03/23/2021  ? 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 Ptosis of both eyebrows 03/23/2021  ? Ptosis of both upper eyelids 03/23/2021  ? Senile ectropion of both lower eyelids 03/23/2021  ? Senile purpura ([place]) 03/07/2021  ? Acute anxiety 03/07/2021  ? Panniculitis 02/03/2021  ? Myalgia due to statin 12/26/2020  ? Telogen effluvium 12/26/2020  ? Candidal intertrigo 12/06/2020  ? Microcytic anemia 12/06/2020  ? Polyneuropathy associated with underlying disease ([place]) 02/03/2020  ? Lump in chest 12/28/2019  ?  Grief 10/20/2019  ? Mixed stress and urge urinary incontinence 08/21/2019  ? Avitaminosis D 07/15/2019  ? Drug-induced constipation 06/12/2019  ? General weakness 07/09/2018  ? Lumbar spondylosis 07/08/2018  ? Chronic bilateral low back pain with bilateral sciatica 07/08/2018  ? Chronic pain syndrome 07/08/2018   ? Urinary incontinence 03/19/2018  ? 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 Atypical endometrial hyperplasia 08/15/2017  ? Lymphedema 01/31/2017  ? Hyperlipidemia associated with type 2 diabetes mellitus ([place]) 12/14/2016  ? Hypertension associated with diabetes ([place]) 12/14/2016  ? Chronic systolic CHF (congestive heart failure), NYHA class 3 ([place]) 10/06/2015  ? History of MI (myocardial infarction) 09/20/2015  ? Allergic rhinitis 07/06/2015  ? Chronic venous insufficiency 07/06/2015  ? Atherosclerosis of coronary artery 07/06/2015  ? MDD (major depressive disorder) 07/06/2015  ? Fibromyalgia 07/06/2015  ? Encounter for long-term (current) use of other medications 07/26/2014  ? Encounter for long-term (current) use of high-risk medication 07/26/2014  ? Type 2 diabetes mellitus with complication, without long-term current use of insulin ([place]) 04/08/2014  ? Migraines 04/08/2014  ? Gastroduodenal ulcer 04/08/2014  ? Venous stasis 04/08/2014  ? Osteoarthritis 04/08/2014  ? Coronary artery disease 04/08/2014  ? Peptic ulcer disease 04/08/2014  ? Obesity 04/08/2014  ? Seropositive rheumatoid arthritis ([place]) 04/06/2014  ? ?PCP:  Virginia Crews, MD ?Pharmacy:   ?[place], [place] ?[place] ?[place] [address] ?Phone: [phone] Fax: [phone] ? ? ? ? ?Social Determinants of Health (SDOH) Interventions ?  ? ?Readmission Risk Interventions ?   ? View : No data to display.  ?  ?  ?  ? ? ? ?

## 2022-03-05 NOTE — Consult Note (Signed)
? ?  [facility] CM Inpatient Consult ? ? ?03/05/2022 ? ?[name] ?Apr 17, 1951 ?[id number] ? ?[place] Organization [ACO] Patient: [name] [name]  ? ?Primary Care Provider:  Virginia Crews, MD, [place], is an embedded provider with a Chronic Care Management team and program, and is listed for the transition of care follow up and appointments. ? ?Patient was screened for less than 30 days readmission and active in an Embedded practice service needs for chronic care management, patient is listed as active with the Embedded Education officer, museum. Came by patient room and patient was with nursing and patient is on contact precautions.  ? ?Plan: Will continue to follow for disposition and needs and a notification is to be sent to the [place] Management. ? ?Please contact for further questions, ? ?Natividad Brood, RN BSN CCM ?[facility] Liaison ? [phone] business mobile phone ?Toll free office [phone]  ?Fax number: [phone] ?[email or web address] ? ? ? ?

## 2022-03-05 NOTE — Progress Notes (Signed)
1 Day Post-Op  ?Subjective: ?Patient sitting up in bed, resting, watching TV.  She reports she is doing well and reports she feels well after surgery.  She denies any infectious symptoms.  Reports she would like to go home whenever possible. ? ?She reports she was receiving home health care at home prior to admission to the hospital. ? ?Objective: ?Vital signs in last 24 hours: ?Temp:  [97.5 ?F (36.4 ?C)-98.3 ?F (36.8 ?C)] 97.5 ?F (36.4 ?C) (04/03 1610) ?Pulse Rate:  [67-88] 67 (04/03 0739) ?Resp:  [17-18] 17 (04/03 0739) ?BP: (118-130)/(56-70) 122/61 (04/03 0739) ?SpO2:  [97 %-98 %] 98 % (04/03 0739) ?Last BM Date : 03/04/22 ? ?Intake/Output from previous day: ?04/02 0701 - 04/03 0700 ?In: 500 [I.V.:500] ?Out: 145 [Drains:115; Blood:30] ?Intake/Output this shift: ?No intake/output data recorded. ? ?General appearance: alert, cooperative, no distress, and sitting in bedside chair ?Head: Normocephalic, without obvious abnormality, atraumatic ?GI: Soft, nontender, wound VAC in place with good seal noted.  250 cc of serosanguineous drainage in canister.  JP drain in place along right medial incision, serosanguineous drainage in bulb.  Bulb was not charged.  The remaining abdominal incision is intact and healing well. ?Extremities: Extremities normal ? ? ?Lab Results:  ? ?  Latest Ref Rng & Units 03/05/2022  ?  2:00 AM 03/04/2022  ?  1:07 AM 03/03/2022  ?  6:46 AM  ?CBC  ?WBC 4.0 - 10.5 K/uL 6.2   5.0   4.7    ?Hemoglobin 12.0 - 15.0 g/dL 8.8   7.6   7.7    ?Hematocrit 36.0 - 46.0 % 28.2   25.3   25.7    ?Platelets 150 - 400 K/uL 577   405   405    ?  ?BMET ?Recent Labs  ?  03/04/22 ?0107 03/05/22 ?0200  ?NA 136 132*  ?K 4.0 4.1  ?CL 105 102  ?CO2 25 22  ?GLUCOSE 112* 194*  ?BUN 13 19  ?CREATININE 0.68 0.87  ?CALCIUM 8.1* 8.1*  ? ?PT/INR ?No results for input(s): LABPROT, INR in the last 72 hours. ?ABG ?No results for input(s): PHART, HCO3 in the last 72 hours. ? ?Invalid input(s): PCO2, PO2 ? ?Studies/Results: ?No results  found. ? ?Anti-infectives: ?Anti-infectives (From admission, onward)  ? ? Start     Dose/Rate Route Frequency Ordered Stop  ? 03/04/22 0832  ceFAZolin 1 g / gentamicin 80 mg in NS 500 mL surgical irrigation  Status:  Discontinued       ?   As needed 03/04/22 0832 03/04/22 0855  ? 03/04/22 0800  ceFAZolin 1 g / gentamicin 80 mg in NS 500 mL surgical irrigation       ?  Irrigation  Once 03/04/22 0754    ? 03/03/22 1000  vancomycin (VANCOREADY) IVPB 750 mg/150 mL       ? 750 mg ?150 mL/hr over 60 Minutes Intravenous Every 12 hours 03/02/22 1943    ? 03/03/22 0200  piperacillin-tazobactam (ZOSYN) IVPB 3.375 g       ? 3.375 g ?12.5 mL/hr over 240 Minutes Intravenous Every 8 hours 03/02/22 1943    ? 03/02/22 1930  piperacillin-tazobactam (ZOSYN) IVPB 3.375 g       ? 3.375 g ?100 mL/hr over 30 Minutes Intravenous  Once 03/02/22 1924 03/02/22 2008  ? 03/02/22 1930  vancomycin (VANCOREADY) IVPB 1750 mg/350 mL       ? 1,750 mg ?175 mL/hr over 120 Minutes Intravenous  Once 03/02/22 1924 03/02/22 2238  ? ?  ? ? ?  Assessment/Plan: ?s/p Procedure(s): ?IRRIGATION AND DEBRIDEMENT ABDOMEN WITH VAC PLACEMENT, MYRIAD PLACEMENT ? ? ?Patient doing well after debridement of abdominal wound with Dr. Marla Roe yesterday. ? ?Patient is stable for discharge from plastic surgery stance, will need home health assistance with wound VAC changes 2 times per week with initial change 1 week postop. ? ? ? LOS: 3 days  ? ? ?Carola Rhine Jaceion Aday, PA-C ?03/05/2022 ? ?

## 2022-03-05 NOTE — Progress Notes (Signed)
Physical Therapy Treatment ?Patient Details ?Name: [name] [name] ?MRN: [id number] ?DOB: [birth year] ?Today's Date: 03/05/2022 ? ? ?History of Present Illness 71 y/o female presented to ED on 03/02/22 for complaint of surgical abdominal wound dehiscence. Recently admitted 3/15-3/24 for fall and wound care. S/p liposuction and panniculectomy on 3/6. 4/2 to OR for abd wound partial closure and VAC  PMH: HTN, uterine cancer, DM2, CHF, CAD with prior MI ? ?  ?PT Comments  ? ? Patient reports she has been mobilizing independently in room (to/from bathroom) with nursing consent. Patient needing to use bathroom and assessed her mobility and pt demonstrated safety with IV pole and disconnecting VAC (although taught to clamp and not just disconnect). Patient has no concerns re: her mobility and returning home and hopes to go home today. Discharge from PT with pt in agreement. ? ?   ?Recommendations for follow up therapy are one component of a multi-disciplinary discharge planning process, led by the attending physician.  Recommendations may be updated based on patient status, additional functional criteria and insurance authorization. ? ?Follow Up Recommendations ? No PT follow up ?  ?  ?Assistance Recommended at Discharge PRN  ?Patient can return home with the following Assistance with cooking/housework ?  ?Equipment Recommendations ? None recommended by PT  ?  ?Recommendations for Other Services   ? ? ?  ?Precautions / Restrictions Precautions ?Precautions: Fall ?Restrictions ?Weight Bearing Restrictions: No  ?  ? ?Mobility ? Bed Mobility ?Overal bed mobility: Modified Independent ?Bed Mobility: Rolling, Sidelying to Sit, Sit to Sidelying ?Rolling: Modified independent (Device/Increase time) ?Sidelying to sit: Modified independent (Device/Increase time) ?  ?  ?  ?General bed mobility comments: no physical assist required. Patient using momentum for in/out of bed ?  ? ?Transfers ?Overall transfer level: Independent ?Equipment  used: None ?Transfers: Sit to/from Stand ?Sit to Stand: Supervision, Independent ?  ?  ?  ?  ?  ?General transfer comment: from bed and toilet ?  ? ?Ambulation/Gait ?Ambulation/Gait assistance: Independent ?Gait Distance (Feet): 50 Feet ?Assistive device: None, IV Pole ?Gait Pattern/deviations: Step-through pattern, Decreased stride length, Wide base of support, Trunk flexed ?Gait velocity: decreased ?  ?  ?General Gait Details: initially PT pushed IV pole with pt having no difficulty with gait; pt then demonstrated ability to safely push IV pole (nursing has had her doing this to go to/from bathroom) ? ? ?Stairs ?Stairs:  (pt reports short steps up to house with rail and does not feel need to practice) ?  ?  ?  ?  ? ? ?Wheelchair Mobility ?  ? ?Modified Rankin (Stroke Patients Only) ?  ? ? ?  ?Balance Overall balance assessment: Independent ?Sitting-balance support: No upper extremity supported, Feet supported ?Sitting balance-Leahy Scale: Good ?  ?  ?Standing balance support: No upper extremity supported, During functional activity ?Standing balance-Leahy Scale: Good ?  ?  ?  ?  ?  ?  ?  ?  ?  ?  ?  ?  ?  ? ?  ?Cognition Arousal/Alertness: Awake/alert ?Behavior During Therapy: [facility] for tasks assessed/performed ?Overall Cognitive Status: Within Functional Limits for tasks assessed ?  ?  ?  ?  ?  ?  ?  ?  ?  ?  ?  ?  ?  ?  ?  ?  ?  ?  ?  ? ?  ?Exercises   ? ?  ?General Comments General comments (skin integrity, edema, etc.): Nursing also showed pt how to disconnect  VAC tubing for walking to bathroom ?  ?  ? ?Pertinent Vitals/Pain Pain Assessment ?Pain Assessment: Faces ?Faces Pain Scale: Hurts a little bit ?Pain Location: abdomen ?Pain Descriptors / Indicators: Guarding ?Pain Intervention(s): Limited activity within patient's tolerance  ? ? ?Home Living   ?  ?  ?  ?  ?  ?  ?  ?  ?  ?   ?  ?Prior Function    ?  ?  ?   ? ?PT Goals (current goals can now be found in the care plan section) Acute Rehab PT Goals ?Patient  Stated Goal: to get this healed so I can go to [place] with my fiance ?PT Goal Formulation: With patient ?Time For Goal Achievement: 03/17/22 ?Potential to Achieve Goals: Good ?Progress towards PT goals: Goals met/education completed, patient discharged from PT ? ?  ?Frequency ? ? ?   ? ? ? ?  ?PT Plan Discharge plan needs to be updated;Equipment recommendations need to be updated  ? ? ?Co-evaluation   ?  ?  ?  ?  ? ?  ?AM-PAC PT "6 Clicks" Mobility   ?Outcome Measure ? Help needed turning from your back to your side while in a flat bed without using bedrails?: None ?Help needed moving from lying on your back to sitting on the side of a flat bed without using bedrails?: None ?Help needed moving to and from a bed to a chair (including a wheelchair)?: None ?Help needed standing up from a chair using your arms (e.g., wheelchair or bedside chair)?: None ?Help needed to walk in hospital room?: None ?Help needed climbing 3-5 steps with a railing? : None ?6 Click Score: 24 ? ?  ?End of Session   ?Activity Tolerance: Patient tolerated treatment well ?Patient left: with call bell/phone within reach;in chair ?Nurse Communication: Mobility status ?PT Visit Diagnosis: Unsteadiness on feet (R26.81);Muscle weakness (generalized) (M62.81);History of falling (Z91.81);Pain ?Pain - Right/Left:  (abdomen) ?Pain - part of body:  (abdomen) ?  ?PT Discharge Note ? ?Patient is being discharged from PT services secondary to: ? ?Goals met and no further therapy needs identified. ? ?Please see latest Therapy Progress Note for current level of functioning and progress toward goals. ? ?Progress and discharge plan and discussed with patient/caregiver and they ? ?Agree ? ? ?Time: 6168-3729 ?PT Time Calculation (min) (ACUTE ONLY): 27 min ? ?Charges:  $Gait Training: 23-37 mins          ?          ? ? ?Arby Barrette, PT ?Acute Rehabilitation Services  ?Pager [phone] ?Office [phone] ? ? ? ?Jeanie Cooks Dula Havlik ?03/05/2022, 10:01 AM ? ?

## 2022-03-05 NOTE — Progress Notes (Signed)
? ?[facility] ?PROGRESS NOTE ? ? ?[name] [name] MLJ:[id number] DOB: [birth year] DOA: 03/02/2022  3 ?DOS: the patient was seen and examined on 03/05/2022 ? ?PCP: Virginia Crews, MD ? ?Brief History and Hospital Course:  ?71 y.o. female with medical history significant of diabetes mellitus x2, fibromyalgia, chronic systolic CHF, coronary artery disease with prior myocardial infarction, hypertension, hyperlipidemia, previous uterine cancer and recent panniculectomy for panniculitis cysts by Dr. Royce Macadamia on 02/05/22 who presented with wound dehiscence, foul purulent drainage, chills, fatigue, and abdominal discomfort.  Patient was hospitalized for further management.  Underwent excision surgery and wound VAC placement on 4/2. ? ?Consultants: Plastic surgery ? ?Procedures:  ?4/2: ?Excision of abdominal wound 6 x 15 cm skin and soft tissue ?Partial closure of the wound 6 cm ?Placement of Myriad powder 2 gm and sheet 7 x 10 cm ?VAC placement ? ? ? ?Subjective: ?Patient states that she is feeling well.  Asking when she can go home.  Told her that this would be used something that the plastic surgeon will need to advise.  Denies any pain. ? ? ?Assessment/Plan: ? ? ?* Abdominal wall cellulitis ?Patient recently underwent panniculectomy by plastic surgery.  Developed wound dehiscence and cellulitis of the abdominal wall.  CT scan did not show any obvious abscess.  Patient started on broad-spectrum antibiotics with vancomycin and Zosyn.  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 ?Patient was seen by plastic surgery. Underwent excision of the abdominal wound, partial closure of the wound and placement of wound VAC on 4/2. ?WBC remains normal.  Cultures are pending.  Remains afebrile. ? ?Wound dehiscence ?As per plastic surgery.  Underwent surgery on 4/2. ? ?Chronic systolic CHF (congestive heart failure), NYHA class 3 ([place]) ?There is an echocardiogram report in our system from 2016 which showed that EF was 25 to 30% at that time.   She is followed by [facility] clinic cardiology in [place].  Care everywhere was reviewed.  It appears that her systolic function was noted to be normal when checked in August 2022.  Noted to be on carvedilol. ?Cardiac status is stable. ? ?Coronary artery disease ?History of stent placement previously.  Cardiac status appears to be stable.  Continue beta-blocker.  Not noted to be on any antiplatelet agents currently.  Noted to be on Zetia. ? ?Type 2 diabetes mellitus with complication, without long-term current use of insulin ([place]) ?HbA1c 7.4.  She is on metformin at home.  Continue SSI.  CBGs are reasonably well controlled. ? ?Normocytic anemia ?Old labs reviewed.  Hemoglobin has been between 8 and 10 during most of this year.   ?Drop in hemoglobin during this hospital stay is likely dilutional.  No evidence of overt blood loss. ?Ferritin noted to be 38, iron 20, TIBC 242, percent saturation is 8.   ?Vitamin B12 level is borderline low at 234.  Folate is 10.8.  Started on B12 supplementation. ?Hemoglobin stable.  Continue to monitor.  Transfuse if it drops below 7. ? ? ?Morbid obesity ?Estimated body mass index is 37.63 kg/m? as calculated from the following: ?  Height as of this encounter: 5' 7.01" (1.702 m). ?  Weight as of this encounter: 109 kg. ? ? ?DVT Prophylaxis: Subcutaneous heparin will be initiated today ?Code Status: Full code ?Family Communication: Discussed with the patient ?Disposition Plan: Home health recommended by physical therapy. ? ?Status is: Inpatient ?Remains inpatient appropriate because: Need for IV antibiotics and surgical intervention ? ? ? ? ?Medications: Scheduled: ? buPROPion  300 mg  Oral Daily  ? carvedilol  6.25 mg Oral BID WC  ? ceFAZolin 1 g / gentamicin 80 mg in NS 500 mL surgical irrigation   Irrigation Once  ? Chlorhexidine Gluconate Cloth  6 each Topical Q0600  ? cyanocobalamin  1,000 mcg Intramuscular Daily  ? Followed by  ? [START ON 03/09/2022] vitamin B-12  1,000 mcg Oral  Daily  ? DULoxetine  60 mg Oral Daily  ? ezetimibe  10 mg Oral Daily  ? insulin aspart  0-20 Units Subcutaneous TID WC  ? insulin aspart  0-5 Units Subcutaneous QHS  ? mirabegron ER  25 mg Oral Daily  ? mupirocin ointment  1 application. Nasal BID  ? pregabalin  75 mg Oral TID  ? sodium chloride flush  3 mL Intravenous Q12H  ? ?Continuous: ? 0.9 % NaCl with KCl 20 mEq / L 10 mL/hr at 03/04/22 2104  ? piperacillin-tazobactam (ZOSYN)  IV 3.375 g (03/05/22 0517)  ? vancomycin 750 mg (03/04/22 2107)  ? ?BHA:[id number] **OR** acetaminophen, morphine injection, ondansetron **OR** ondansetron (ZOFRAN) IV, oxyCODONE, polyethylene glycol, traZODone ? ?Antibiotics: ?Anti-infectives (From admission, onward)  ? ? Start     Dose/Rate Route Frequency Ordered Stop  ? 03/04/22 0832  ceFAZolin 1 g / gentamicin 80 mg in NS 500 mL surgical irrigation  Status:  Discontinued       ?   As needed 03/04/22 0832 03/04/22 0855  ? 03/04/22 0800  ceFAZolin 1 g / gentamicin 80 mg in NS 500 mL surgical irrigation       ?  Irrigation  Once 03/04/22 0754    ? 03/03/22 1000  vancomycin (VANCOREADY) IVPB 750 mg/150 mL       ? 750 mg ?150 mL/hr over 60 Minutes Intravenous Every 12 hours 03/02/22 1943    ? 03/03/22 0200  piperacillin-tazobactam (ZOSYN) IVPB 3.375 g       ? 3.375 g ?12.5 mL/hr over 240 Minutes Intravenous Every 8 hours 03/02/22 1943    ? 03/02/22 1930  piperacillin-tazobactam (ZOSYN) IVPB 3.375 g       ? 3.375 g ?100 mL/hr over 30 Minutes Intravenous  Once 03/02/22 1924 03/02/22 2008  ? 03/02/22 1930  vancomycin (VANCOREADY) IVPB 1750 mg/350 mL       ? 1,750 mg ?175 mL/hr over 120 Minutes Intravenous  Once 03/02/22 1924 03/02/22 2238  ? ?  ? ? ?Objective: ? ?Vital Signs ? ?Vitals:  ? 03/04/22 2034 03/05/22 0003 03/05/22 0346 03/05/22 0739  ?BP: 118/70 125/63 (!) 130/56 122/61  ?Pulse: 85 74 70 67  ?Resp: '18 17 18 17  ' ?Temp: 98 ?F (36.7 ?C) 97.9 ?F (36.6 ?C) 98 ?F (36.7 ?C) (!) 97.5 ?F (36.4 ?C)  ?TempSrc: Oral Oral Oral Oral  ?SpO2:   97%  98%  ?Weight:      ?Height:      ? ? ?Intake/Output Summary (Last 24 hours) at 03/05/2022 0956 ?Last data filed at 03/04/2022 2115 ?Gross per 24 hour  ?Intake --  ?Output 115 ml  ?Net -115 ml  ? ? ?Filed Weights  ? 03/04/22 0719  ?Weight: 109 kg  ? ? ?General appearance: Awake alert.  In no distress ?Resp: Clear to auscultation bilaterally.  Normal effort ?Cardio: S1-S2 is normal regular.  No S3-S4.  No rubs murmurs or bruit ?GI: Abdominal binder noted.  There is a drain coming out of the wound.  There is a wound VAC attached.  Mild erythema is noted. ?Extremities: No edema.  Full range of  motion of lower extremities. ?Neurologic: Alert and oriented x3.  No focal neurological deficits.  ? ? ? ?Lab Results: ? ?Data Reviewed: I have personally reviewed labs and imaging study reports ? ?CBC: ?Recent Labs  ?Lab 03/02/22 ?1350 03/03/22 ?0646 03/04/22 ?0107 03/05/22 ?0200  ?WBC 6.8 4.7 5.0 6.2  ?NEUTROABS 5.0  --   --   --   ?HGB 9.5* 7.7* 7.6* 8.8*  ?HCT 31.1* 25.7* 25.3* 28.2*  ?MCV 84.3 83.7 83.8 82.0  ?PLT 498* 405* 405* 577*  ? ? ? ?Basic Metabolic Panel: ?Recent Labs  ?Lab 03/02/22 ?1350 03/03/22 ?0646 03/04/22 ?0107 03/05/22 ?0200  ?NA 135 139 136 132*  ?K 3.8 3.9 4.0 4.1  ?CL 100 105 105 102  ?CO2 '23 26 25 22  ' ?GLUCOSE 256* 136* 112* 194*  ?BUN '15 11 13 19  ' ?CREATININE 0.77 0.77 0.68 0.87  ?CALCIUM 8.9 8.2* 8.1* 8.1*  ? ? ? ?GFR: ?Estimated Creatinine Clearance: 78.7 mL/min (by C-G formula based on SCr of 0.87 mg/dL). ? ?Liver Function Tests: ?Recent Labs  ?Lab 03/03/22 ?1388  ?AST 12*  ?ALT 11  ?ALKPHOS 55  ?BILITOT 0.4  ?PROT 5.9*  ?ALBUMIN 1.9*  ? ? ? ? ?HbA1C: ?Recent Labs  ?  03/03/22 ?0646  ?HGBA1C 7.4*  ? ? ? ?CBG: ?Recent Labs  ?Lab 03/04/22 ?1607 03/04/22 ?2038 03/05/22 ?0007 03/05/22 ?7195 03/05/22 ?9747  ?GLUCAP 277* 154* 93 168* 122*  ? ? ? ? ?Anemia Panel: ?Recent Labs  ?  03/03/22 ?0646 03/04/22 ?0107  ?[id number]  --  234  ?FOLATE  --  10.8  ?FERRITIN 38  --   ?TIBC 242*  --   ?IRON 20*  --    ?RETICCTPCT 2.6  --   ? ? ? ?Recent Results (from the past 240 hour(s))  ?Blood culture (routine x 2)     Status: None (Preliminary result)  ? Collection Time: 03/02/22  5:13 PM  ? Specimen: BLOOD  ?Resu

## 2022-03-06 ENCOUNTER — Ambulatory Visit: Payer: Medicare Other | Admitting: Family Medicine

## 2022-03-06 LAB — AEROBIC/ANAEROBIC CULTURE W GRAM STAIN (SURGICAL/DEEP WOUND)

## 2022-03-06 LAB — CBC
HCT: 28.6 % — ABNORMAL LOW (ref 36.0–46.0)
Hemoglobin: 8.5 g/dL — ABNORMAL LOW (ref 12.0–15.0)
MCH: 25.3 pg — ABNORMAL LOW (ref 26.0–34.0)
MCHC: 29.7 g/dL — ABNORMAL LOW (ref 30.0–36.0)
MCV: 85.1 fL (ref 80.0–100.0)
Platelets: 434 10*3/uL — ABNORMAL HIGH (ref 150–400)
RBC: 3.36 MIL/uL — ABNORMAL LOW (ref 3.87–5.11)
RDW: 14.8 % (ref 11.5–15.5)
WBC: 5.4 10*3/uL (ref 4.0–10.5)
nRBC: 0 % (ref 0.0–0.2)

## 2022-03-06 LAB — BASIC METABOLIC PANEL
Anion gap: 7 (ref 5–15)
BUN: 19 mg/dL (ref 8–23)
CO2: 25 mmol/L (ref 22–32)
Calcium: 8.3 mg/dL — ABNORMAL LOW (ref 8.9–10.3)
Chloride: 105 mmol/L (ref 98–111)
Creatinine, Ser: 0.82 mg/dL (ref 0.44–1.00)
GFR, Estimated: >60 mL/min (ref >60)
Glucose, Bld: 136 mg/dL — ABNORMAL HIGH (ref 70–99)
Potassium: 4 mmol/L (ref 3.5–5.1)
Sodium: 137 mmol/L (ref 135–145)

## 2022-03-06 LAB — GLUCOSE, CAPILLARY
Glucose-Capillary: 110 mg/dL — ABNORMAL HIGH (ref 70–99)
Glucose-Capillary: 105 mg/dL — ABNORMAL HIGH (ref 70–99)
Glucose-Capillary: 119 mg/dL — ABNORMAL HIGH (ref 70–99)
Glucose-Capillary: 212 mg/dL — ABNORMAL HIGH (ref 70–99)
Glucose-Capillary: 45 mg/dL — ABNORMAL LOW (ref 70–99)
Glucose-Capillary: 90 mg/dL (ref 70–99)

## 2022-03-06 LAB — VANCOMYCIN, TROUGH: Vancomycin Tr: 21 ug/mL (ref 15–20)

## 2022-03-06 LAB — VANCOMYCIN, PEAK: Vancomycin Pk: 37 ug/mL (ref 30–40)

## 2022-03-06 MED ORDER — CLINDAMYCIN HCL 300 MG PO CAPS: 300.0000 mg | ORAL_CAPSULE | Freq: Four times a day (QID) | ORAL | 0 refills | Status: AC

## 2022-03-06 MED ORDER — OXYCODONE HCL 5 MG PO TABS: 5.0000 mg | ORAL_TABLET | Freq: Four times a day (QID) | ORAL | 0 refills | Status: DC | PRN

## 2022-03-06 MED ORDER — CLINDAMYCIN HCL 300 MG PO CAPS
300.0000 mg | ORAL_CAPSULE | Freq: Four times a day (QID) | ORAL | Status: DC
  Administered 2022-03-06: 300 mg via ORAL
  Filled 2022-03-06 (×2): qty 1

## 2022-03-06 MED ORDER — CYANOCOBALAMIN 1000 MCG PO TABS: 1000.0000 ug | ORAL_TABLET | Freq: Every day | ORAL | 2 refills | Status: DC

## 2022-03-06 MED ORDER — POLYETHYLENE GLYCOL 3350 17 G PO PACK: 17.0000 g | PACK | Freq: Every day | ORAL | 0 refills | Status: DC | PRN

## 2022-03-06 MED ORDER — AMOXICILLIN-POT CLAVULANATE 875-125 MG PO TABS
1.0000 | ORAL_TABLET | Freq: Two times a day (BID) | ORAL | Status: DC
  Administered 2022-03-06: 1 via ORAL
  Filled 2022-03-06: qty 1

## 2022-03-06 MED ORDER — SACCHAROMYCES BOULARDII 250 MG PO CAPS
250.0000 mg | ORAL_CAPSULE | Freq: Two times a day (BID) | ORAL | Status: DC
  Administered 2022-03-06: 250 mg via ORAL
  Filled 2022-03-06: qty 1

## 2022-03-06 MED ORDER — SACCHAROMYCES BOULARDII 250 MG PO CAPS: 250.0000 mg | ORAL_CAPSULE | Freq: Two times a day (BID) | ORAL | 0 refills | Status: AC

## 2022-03-06 MED ORDER — SENNOSIDES-DOCUSATE SODIUM 8.6-50 MG PO TABS: 2.0000 | ORAL_TABLET | Freq: Two times a day (BID) | ORAL | 1 refills | Status: DC

## 2022-03-06 MED ORDER — AMOXICILLIN-POT CLAVULANATE 875-125 MG PO TABS: 1.0000 | ORAL_TABLET | Freq: Two times a day (BID) | ORAL | 0 refills | Status: AC

## 2022-03-06 NOTE — Discharge Summary (Signed)
?[facility] ? ?Physician Discharge Summary  ? ?Patient ID: ?[name] [name] ?MRN: [id number] ?DOB/AGE: 04-15-1951 71 y.o. ? ?Admit date: 03/02/2022 ?Discharge date:   03/06/2022 ? ? ?PCP: Virginia Crews, MD ? ?DISCHARGE DIAGNOSES:  ?Principal Problem: ?  Abdominal wall cellulitis ?Active Problems: ?  Wound dehiscence ?  Coronary artery disease ?  Chronic systolic CHF (congestive heart failure), NYHA class 3 ([place]) ?  Type 2 diabetes mellitus with complication, without long-term current use of insulin ([place]) ?  Normocytic anemia ? ? ?RECOMMENDATIONS FOR OUTPATIENT FOLLOW UP: ?Follow-up with plastic surgery in 1 to 2 weeks as instructed by them ? ? ?Home Health: Home health RN for wound VAC ?Equipment/Devices: Wound VAC ? ?CODE STATUS: Full code ? ?DISCHARGE CONDITION: fair ? ?Diet recommendation: As before ? ?INITIAL HISTORY: ?71 y.o. female with medical history significant of diabetes mellitus x2, fibromyalgia, chronic systolic CHF, coronary artery disease with prior myocardial infarction, hypertension, hyperlipidemia, previous uterine cancer and recent panniculectomy for panniculitis cysts by Dr. Royce Macadamia on 02/05/22 who presented with wound dehiscence, foul purulent drainage, chills, fatigue, and abdominal discomfort.  Patient was hospitalized for further management.  Underwent excision surgery and wound VAC placement on 4/2. ? ?Consultants: Plastic surgery ?  ?Procedures:  ?4/2: ?Excision of abdominal wound 6 x 15 cm skin and soft tissue ?Partial closure of the wound 6 cm ?Placement of Myriad powder 2 gm and sheet 7 x 10 cm ?VAC placement ? ? ?HOSPITAL COURSE:  ? ? ?* Abdominal wall cellulitis ?Patient recently underwent panniculectomy by plastic surgery.  Developed wound dehiscence and cellulitis of the abdominal wall.  CT scan did not show any obvious abscess.  Patient started on broad-spectrum antibiotics with vancomycin and Zosyn.  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 ?Patient was seen by plastic surgery.  Underwent excision of the abdominal wound, partial closure of the wound and placement of wound VAC on 4/2. ?Cultures growing MRSA.  Also growing PASTEURELLA MULTOCIDA . ?Sensitivities reviewed.  Patient allergic to doxycycline and sulfa antibiotics.  We will discharge her on Augmentin and clindamycin. ?She will be going home with the wound VAC as per plastic surgery. ? ? ?Wound dehiscence ?As per plastic surgery.  Underwent surgery on 4/2. ? ?Chronic systolic CHF (congestive heart failure), NYHA class 3 ([place]) ?Echocardiogram report in our system from 2016 showed that EF was 25 to 30% at that time.  She is followed by [facility] clinic cardiology in [place].   ?Care everywhere was reviewed.  It appears that her systolic function was noted to be normal when checked in August 2022.  Noted to be on carvedilol. ?Cardiac status is stable. ? ?Coronary artery disease ?History of stent placement previously.  Cardiac status appears to be stable.  Continue beta-blocker.  Not noted to be on any antiplatelet agents currently.  Noted to be on Zetia. ? ?Type 2 diabetes mellitus with complication, without long-term current use of insulin ([name] [name]) ?HbA1c 7.4.  Continue home medications ? ?Normocytic anemia ?Old labs reviewed.  Hemoglobin has been between 8 and 10 during most of this year.   ?Drop in hemoglobin during this hospital stay is likely dilutional.  No evidence of overt blood loss. ?Ferritin noted to be 38, iron 20, TIBC 242, percent saturation is 8.   ?Vitamin B12 level is borderline low at 234.  Folate is 10.8.  Started on B12 supplementation. ?Hemoglobin has been stable. ? ? ?Incidental findings of bilateral nephrolithiasis and cholelithiasis noted on CT scan.  Asymptomatic. ? ?Retained stool also  noted on CT scan.  She will be prescribed laxatives at discharge. ? ?Obesity ?Estimated body mass index is 37.63 kg/m? as calculated from the following: ?  Height as of this encounter: 5' 7.01" (1.702 m). ?  Weight as of this  encounter: 109 kg. ? ?Patient is stable.  Wants to go home.  Okay for discharge once wound VAC is being arranged for home. ? ? ?PERTINENT LABS: ? ?The results of significant diagnostics from this hospitalization (including imaging, microbiology, ancillary and laboratory) are listed below for reference.   ? ?Microbiology: ?Recent Results (from the past 240 hour(s))  ?Blood culture (routine x 2)     Status: None (Preliminary result)  ? Collection Time: 03/02/22  5:13 PM  ? Specimen: BLOOD  ?Result Value Ref Range Status  ? Specimen Description BLOOD BLOOD RIGHT FOREARM  Final  ? Special Requests   Final  ?  BOTTLES DRAWN AEROBIC AND ANAEROBIC Blood Culture results may not be optimal due to an inadequate volume of blood received in culture bottles  ? Culture   Final  ?  NO GROWTH 4 DAYS ?Performed at [facility], [place] [address] [place], [place] [address] ?  ? Report Status PENDING  Incomplete  ?Aerobic/Anaerobic Culture w Gram Stain (surgical/deep wound)     Status: None (Preliminary result)  ? Collection Time: 03/03/22 12:03 AM  ? Specimen: Abdomen; Abscess  ?Result Value Ref Range Status  ? Specimen Description ABDOMEN  Final  ? Special Requests NONE  Final  ? Gram Stain   Final  ?  RARE WBC PRESENT,BOTH PMN AND MONONUCLEAR ?RARE GRAM POSITIVE COCCI ?  ? Culture   Final  ?  MODERATE METHICILLIN RESISTANT STAPHYLOCOCCUS AUREUS ?ABUNDANT PASTEURELLA MULTOCIDA ?Usually susceptible to penicillin and other beta lactam agents,quinolones,macrolides and tetracyclines. ?HOLDING FOR POSSIBLE ANAEROBE ?Performed at [facility], [place] [address], [place], [place] [address] ?  ? Report Status PENDING  Incomplete  ? Organism ID, Bacteria METHICILLIN RESISTANT STAPHYLOCOCCUS AUREUS  Final  ?    Susceptibility  ? Methicillin resistant staphylococcus aureus - MIC*  ?  CIPROFLOXACIN >=8 RESISTANT Resistant   ?  ERYTHROMYCIN <=0.25 SENSITIVE Sensitive   ?  GENTAMICIN <=0.5 SENSITIVE Sensitive   ?  OXACILLIN >=4 RESISTANT  Resistant   ?  TETRACYCLINE <=1 SENSITIVE Sensitive   ?  VANCOMYCIN <=0.5 SENSITIVE Sensitive   ?  TRIMETH/SULFA <=10 SENSITIVE Sensitive   ?  CLINDAMYCIN <=0.25 SENSITIVE Sensitive   ?  RIFAMPIN <=0.5 SENSITIVE Sensitive   ?  Inducible Clindamycin NEGATIVE Sensitive   ?  * MODERATE METHICILLIN RESISTANT STAPHYLOCOCCUS AUREUS  ?Surgical pcr screen     Status: Abnormal  ? Collection Time: 03/04/22  6:18 AM  ? Specimen: Nasal Mucosa; Nasal Swab  ?Result Value Ref Range Status  ? MRSA, PCR POSITIVE (A) NEGATIVE Final  ?  Comment: RESULT CALLED TO, READ BACK BY AND VERIFIED WITH: ?L,HARRIEN RN '@[id number]'  03/04/22 EB ?  ? Staphylococcus aureus POSITIVE (A) NEGATIVE Final  ?  Comment: (NOTE) ?The Xpert SA Assay (FDA approved for NASAL specimens in patients 48 ?years of age and older), is one component of a comprehensive ?surveillance program. It is not intended to diagnose infection nor to ?guide or monitor treatment. ?Performed at [facility], [place] [address], [place], [place] ?[address] ?  ?Aerobic/Anaerobic Culture w Gram Stain (surgical/deep wound)     Status: None (Preliminary result)  ? Collection Time: 03/04/22  8:09 AM  ? Specimen: Wound  ?  Result Value Ref Range Status  ? Specimen Description WOUND  Final  ? Special Requests ABDOMINAL WOUND SPEC A  Final  ? Gram Stain   Final  ?  RARE WBC PRESENT,BOTH PMN AND MONONUCLEAR ?NO ORGANISMS SEEN ?Performed at [facility], [place] [address] [place], [place] [address] ?  ? Culture RARE METHICILLIN RESISTANT STAPHYLOCOCCUS AUREUS  Final  ? Report Status PENDING  Incomplete  ? Organism ID, Bacteria METHICILLIN RESISTANT STAPHYLOCOCCUS AUREUS  Final  ?    Susceptibility  ? Methicillin resistant staphylococcus aureus - MIC*  ?  CIPROFLOXACIN >=8 RESISTANT Resistant   ?  ERYTHROMYCIN <=0.25 SENSITIVE Sensitive   ?  GENTAMICIN <=0.5 SENSITIVE Sensitive   ?  OXACILLIN >=4 RESISTANT Resistant   ?  TETRACYCLINE <=1 SENSITIVE Sensitive   ?  VANCOMYCIN <=0.5 SENSITIVE Sensitive    ?  TRIMETH/SULFA <=10 SENSITIVE Sensitive   ?  CLINDAMYCIN <=0.25 SENSITIVE Sensitive   ?  RIFAMPIN <=0.5 SENSITIVE Sensitive   ?  Inducible Clindamycin NEGATIVE Sensitive   ?  * RARE METHICILLIN RESISTANT

## 2022-03-06 NOTE — Progress Notes (Signed)
Discharge instructions given verbalized understanding of medications and follow-up with PCP. ?

## 2022-03-06 NOTE — TOC Transition Note (Addendum)
Transition of Care (TOC) - CM/SW Discharge Note ? ? ?Patient Details  ?Name: [name] [name] ?MRN: [id number] ?Date of Birth: Oct 27, 1951 ? ?Transition of Care (TOC) CM/SW Contact:  ?Carles Collet, RN ?Phone Number: ?03/06/2022, 11:03 AM ? ? ?Clinical Narrative:    ?Patient with order to DC to home. ?Notified [facility] liaison of DC planned for today, requested from MD team [facility] order with dates/ days of the week for wound vac change.  ?Spoke w [place] and she states VAC order needs signed by Dr Marla Roe via escript. Notified Dr Santiago Bur and PA Scheeler by secure chat, and it has been acknowledged. ? ?Patient clear from [facility] once home wound VAC has been delivered to room and applied by bedside nurse.  ? ? ?Delivered VAC to room, from [facility] supply, charging on wall, instructed nurse to let charge for 30 minutes prior to applying then patient can DC ? ? ? ? ?Final next level of care: [place] ?Barriers to Discharge: No Barriers Identified ? ? ?Patient Goals and CMS Choice ?Patient states their goals for this hospitalization and ongoing recovery are:: return home ?CMS Medicare.gov Compare Post Acute Care list provided to:: Patient ?Choice offered to / list presented to : Patient ? ?Discharge Placement ?  ?           ?  ?  ?  ?  ? ?Discharge Plan and Services ?  ?Discharge Planning Services: CM Consult ?Post Acute Care Choice: Home Health, Durable Medical Equipment          ?DME Arranged: Vac ?DME Agency: [employer] ?Date DME Agency Contacted: 03/06/22 ?Time DME Agency Contacted: [id number] ?Representative spoke with at DME Agency: Linus Orn ?HH Arranged: RN, PT ?[place] Agency: [facility] ?Date HH Agency Contacted: 03/06/22 ?Time [place]: 2035 ?Representative spoke with at [place]: [name] ? ?Social Determinants of Health (SDOH) Interventions ?  ? ? ?Readmission Risk Interventions ?   ? View : No data to display.  ?  ?  ?  ? ? ? ? ? ?

## 2022-03-07 LAB — CULTURE, BLOOD (ROUTINE X 2): Culture: NO GROWTH

## 2022-03-08 ENCOUNTER — Telehealth: Payer: Self-pay

## 2022-03-08 NOTE — Telephone Encounter (Signed)
Received call from [place] with [facility] requesting clarification on wound care orders. Please call her at [phone] to possibly relay verbal orders.  ?

## 2022-03-08 NOTE — Telephone Encounter (Signed)
Spoke with Mardene Celeste at [facility]. I relayed a verbal order for eval and treat and I would run recommendations through Dr. Marla Roe. She understood. ?

## 2022-03-09 LAB — AEROBIC/ANAEROBIC CULTURE W GRAM STAIN (SURGICAL/DEEP WOUND)

## 2022-03-12 ENCOUNTER — Telehealth: Payer: Self-pay

## 2022-03-12 ENCOUNTER — Telehealth: Payer: Self-pay | Admitting: Surgical

## 2022-03-12 MED ORDER — CLINDAMYCIN HCL 300 MG PO CAPS: 300.0000 mg | ORAL_CAPSULE | Freq: Four times a day (QID) | ORAL | 0 refills | Status: DC

## 2022-03-12 NOTE — Telephone Encounter (Signed)
Reviewed patient's culture results, called patient to discuss current antibiotic she has done.  Also reviewed patient's EMR chart, Augmentin and clindamycin were prescribed on 03/06/2022.  Patient reports she has not picked these up as she does not have transportation to the pharmacy at this time.  She does report that she has a Optometrist, but she has not set this up yet.  She reports that the home health RN is coming today to change her wound VAC.  Of note, her wound VAC had come off over the weekend and she has been doing K-Y, 4 x 4 gauze dressing changes.  She reports this is going well.  She is feeling well. ? ?Discussed with patient that I will send additional 5 days of clindamycin for total of 10 days of coverage with clindamycin and 5 days of Augmentin. ?

## 2022-03-12 NOTE — Telephone Encounter (Signed)
Noted  

## 2022-03-12 NOTE — Telephone Encounter (Signed)
Copied from [place] [phone]. Topic: General - Other ?>> Mar 09, 2022  3:57 PM Tessa Lerner A wrote: ?Reason for CRM: Dewana with [facility] has called to share that the patient will resume wound care on Monday 03/12/22  ? ?Please contact further when possible ?

## 2022-03-12 NOTE — Telephone Encounter (Signed)
Hilda Blades, RN from [facility] calling to report issues going on with pt currently and unable to resume care for [facility]. ?She states while she was with pt today her wound vac was off. Pt reported she was dc home on 03/06/22. Wound vac fell off Saturday and wound had been draining copious amounts to where pt was using towel and having to change the towel every 2 hours. She had not received wound vac care since leaving hospital on 03/06/22. Her JP bulb wasn't functioning properly and the insertion site looked loose and infected. She states pt got call for report of having MRSA as well today. She advised pt to go back to ED for care. Pt was waiting on family member to take her this evening.  ?

## 2022-03-13 ENCOUNTER — Emergency Department (HOSPITAL_COMMUNITY): Payer: Medicare Other

## 2022-03-13 ENCOUNTER — Ambulatory Visit: Payer: Medicare Other | Admitting: Family Medicine

## 2022-03-13 ENCOUNTER — Other Ambulatory Visit: Payer: Self-pay

## 2022-03-13 ENCOUNTER — Encounter (HOSPITAL_COMMUNITY): Payer: Self-pay | Admitting: Emergency Medicine

## 2022-03-13 ENCOUNTER — Inpatient Hospital Stay (HOSPITAL_COMMUNITY)
Admission: EM | Admit: 2022-03-13 | Discharge: 2022-03-16 | DRG: 920 | Disposition: A | Discharge status: Home Health | Payer: Medicare Other | Attending: Internal Medicine | Admitting: Internal Medicine

## 2022-03-13 DIAGNOSIS — Z87891 Personal history of nicotine dependence: Secondary | ICD-10-CM | POA: Diagnosis not present

## 2022-03-13 DIAGNOSIS — F32A Depression, unspecified: Secondary | ICD-10-CM | POA: Diagnosis not present

## 2022-03-13 DIAGNOSIS — Z803 Family history of malignant neoplasm of breast: Secondary | ICD-10-CM | POA: Diagnosis not present

## 2022-03-13 DIAGNOSIS — E119 Type 2 diabetes mellitus without complications: Secondary | ICD-10-CM | POA: Diagnosis not present

## 2022-03-13 DIAGNOSIS — Z0389 Encounter for observation for other suspected diseases and conditions ruled out: Secondary | ICD-10-CM | POA: Diagnosis not present

## 2022-03-13 DIAGNOSIS — Y929 Unspecified place or not applicable: Secondary | ICD-10-CM | POA: Diagnosis not present

## 2022-03-13 DIAGNOSIS — Z882 Allergy status to sulfonamides status: Secondary | ICD-10-CM

## 2022-03-13 DIAGNOSIS — I252 Old myocardial infarction: Secondary | ICD-10-CM

## 2022-03-13 DIAGNOSIS — Z8542 Personal history of malignant neoplasm of other parts of uterus: Secondary | ICD-10-CM | POA: Diagnosis not present

## 2022-03-13 DIAGNOSIS — E785 Hyperlipidemia, unspecified: Secondary | ICD-10-CM | POA: Diagnosis present

## 2022-03-13 DIAGNOSIS — T8131XA Disruption of external operation (surgical) wound, not elsewhere classified, initial encounter: Principal | ICD-10-CM | POA: Diagnosis present

## 2022-03-13 DIAGNOSIS — Z888 Allergy status to other drugs, medicaments and biological substances status: Secondary | ICD-10-CM | POA: Diagnosis not present

## 2022-03-13 DIAGNOSIS — Y839 Surgical procedure, unspecified as the cause of abnormal reaction of the patient, or of later complication, without mention of misadventure at the time of the procedure: Secondary | ICD-10-CM | POA: Diagnosis present

## 2022-03-13 DIAGNOSIS — K449 Diaphragmatic hernia without obstruction or gangrene: Secondary | ICD-10-CM | POA: Diagnosis not present

## 2022-03-13 DIAGNOSIS — Z743 Need for continuous supervision: Secondary | ICD-10-CM | POA: Diagnosis not present

## 2022-03-13 DIAGNOSIS — Z8349 Family history of other endocrine, nutritional and metabolic diseases: Secondary | ICD-10-CM

## 2022-03-13 DIAGNOSIS — T8149XA Infection following a procedure, other surgical site, initial encounter: Secondary | ICD-10-CM | POA: Diagnosis not present

## 2022-03-13 DIAGNOSIS — L089 Local infection of the skin and subcutaneous tissue, unspecified: Secondary | ICD-10-CM | POA: Diagnosis present

## 2022-03-13 DIAGNOSIS — Z91040 Latex allergy status: Secondary | ICD-10-CM | POA: Diagnosis not present

## 2022-03-13 DIAGNOSIS — Z825 Family history of asthma and other chronic lower respiratory diseases: Secondary | ICD-10-CM

## 2022-03-13 DIAGNOSIS — Z808 Family history of malignant neoplasm of other organs or systems: Secondary | ICD-10-CM | POA: Diagnosis not present

## 2022-03-13 DIAGNOSIS — T8140XA Infection following a procedure, unspecified, initial encounter: Principal | ICD-10-CM

## 2022-03-13 DIAGNOSIS — B9562 Methicillin resistant Staphylococcus aureus infection as the cause of diseases classified elsewhere: Secondary | ICD-10-CM | POA: Diagnosis present

## 2022-03-13 DIAGNOSIS — Z833 Family history of diabetes mellitus: Secondary | ICD-10-CM | POA: Diagnosis not present

## 2022-03-13 DIAGNOSIS — Z800 Family history of malignant neoplasm of digestive organs: Secondary | ICD-10-CM | POA: Diagnosis not present

## 2022-03-13 DIAGNOSIS — Z8049 Family history of malignant neoplasm of other genital organs: Secondary | ICD-10-CM

## 2022-03-13 DIAGNOSIS — Z8041 Family history of malignant neoplasm of ovary: Secondary | ICD-10-CM | POA: Diagnosis not present

## 2022-03-13 DIAGNOSIS — I2510 Atherosclerotic heart disease of native coronary artery without angina pectoris: Secondary | ICD-10-CM | POA: Diagnosis present

## 2022-03-13 DIAGNOSIS — M797 Fibromyalgia: Secondary | ICD-10-CM | POA: Diagnosis not present

## 2022-03-13 DIAGNOSIS — T148XXA Other injury of unspecified body region, initial encounter: Secondary | ICD-10-CM

## 2022-03-13 DIAGNOSIS — I5042 Chronic combined systolic (congestive) and diastolic (congestive) heart failure: Secondary | ICD-10-CM | POA: Diagnosis not present

## 2022-03-13 DIAGNOSIS — I700 Atherosclerosis of aorta: Secondary | ICD-10-CM | POA: Diagnosis not present

## 2022-03-13 DIAGNOSIS — Z91199 Patient's noncompliance with other medical treatment and regimen due to unspecified reason: Secondary | ICD-10-CM

## 2022-03-13 DIAGNOSIS — E118 Type 2 diabetes mellitus with unspecified complications: Secondary | ICD-10-CM

## 2022-03-13 DIAGNOSIS — N200 Calculus of kidney: Secondary | ICD-10-CM | POA: Diagnosis not present

## 2022-03-13 DIAGNOSIS — I110 Hypertensive heart disease with heart failure: Secondary | ICD-10-CM | POA: Diagnosis not present

## 2022-03-13 DIAGNOSIS — F419 Anxiety disorder, unspecified: Secondary | ICD-10-CM | POA: Diagnosis present

## 2022-03-13 DIAGNOSIS — Z96652 Presence of left artificial knee joint: Secondary | ICD-10-CM | POA: Diagnosis present

## 2022-03-13 DIAGNOSIS — K8020 Calculus of gallbladder without cholecystitis without obstruction: Secondary | ICD-10-CM | POA: Diagnosis not present

## 2022-03-13 DIAGNOSIS — R6889 Other general symptoms and signs: Secondary | ICD-10-CM | POA: Diagnosis not present

## 2022-03-13 DIAGNOSIS — Z8249 Family history of ischemic heart disease and other diseases of the circulatory system: Secondary | ICD-10-CM | POA: Diagnosis not present

## 2022-03-13 LAB — COMPREHENSIVE METABOLIC PANEL
ALT: 11 U/L (ref 0–44)
AST: 15 U/L (ref 15–41)
Albumin: 2.7 g/dL — ABNORMAL LOW (ref 3.5–5.0)
Alkaline Phosphatase: 70 U/L (ref 38–126)
Anion gap: 11 (ref 5–15)
BUN: 16 mg/dL (ref 8–23)
CO2: 24 mmol/L (ref 22–32)
Calcium: 8.8 mg/dL — ABNORMAL LOW (ref 8.9–10.3)
Chloride: 102 mmol/L (ref 98–111)
Creatinine, Ser: 0.79 mg/dL (ref 0.44–1.00)
GFR, Estimated: >60 mL/min (ref >60)
Glucose, Bld: 155 mg/dL — ABNORMAL HIGH (ref 70–99)
Potassium: 4.2 mmol/L (ref 3.5–5.1)
Sodium: 137 mmol/L (ref 135–145)
Total Bilirubin: 0.4 mg/dL (ref 0.3–1.2)
Total Protein: 7 g/dL (ref 6.5–8.1)

## 2022-03-13 LAB — LACTIC ACID, PLASMA: Lactic Acid, Venous: 1.7 mmol/L (ref 0.5–1.9)

## 2022-03-13 LAB — CBC WITH DIFFERENTIAL/PLATELET
Abs Immature Granulocytes: 0.05 10*3/uL (ref 0.00–0.07)
Basophils Absolute: 0.1 10*3/uL (ref 0.0–0.1)
Basophils Relative: 1 %
Eosinophils Absolute: 0.6 10*3/uL — ABNORMAL HIGH (ref 0.0–0.5)
Eosinophils Relative: 5 %
HCT: 31.7 % — ABNORMAL LOW (ref 36.0–46.0)
Hemoglobin: 9.7 g/dL — ABNORMAL LOW (ref 12.0–15.0)
Immature Granulocytes: 1 %
Lymphocytes Relative: 18 %
Lymphs Abs: 1.9 10*3/uL (ref 0.7–4.0)
MCH: 25.2 pg — ABNORMAL LOW (ref 26.0–34.0)
MCHC: 30.6 g/dL (ref 30.0–36.0)
MCV: 82.3 fL (ref 80.0–100.0)
Monocytes Absolute: 0.7 10*3/uL (ref 0.1–1.0)
Monocytes Relative: 6 %
Neutro Abs: 7.5 10*3/uL (ref 1.7–7.7)
Neutrophils Relative %: 69 %
Platelets: 354 10*3/uL (ref 150–400)
RBC: 3.85 MIL/uL — ABNORMAL LOW (ref 3.87–5.11)
RDW: 15.2 % (ref 11.5–15.5)
WBC: 10.8 10*3/uL — ABNORMAL HIGH (ref 4.0–10.5)
nRBC: 0 % (ref 0.0–0.2)

## 2022-03-13 LAB — URINALYSIS, ROUTINE W REFLEX MICROSCOPIC
Bacteria, UA: NONE SEEN
Bilirubin Urine: NEGATIVE
Glucose, UA: NEGATIVE mg/dL
Ketones, ur: NEGATIVE mg/dL
Leukocytes,Ua: NEGATIVE
Nitrite: NEGATIVE
Protein, ur: NEGATIVE mg/dL
Specific Gravity, Urine: 1.033 — ABNORMAL HIGH (ref 1.005–1.030)
pH: 6 (ref 5.0–8.0)

## 2022-03-13 LAB — PROTIME-INR
INR: 1.1 (ref 0.8–1.2)
Prothrombin Time: 13.7 seconds (ref 11.4–15.2)

## 2022-03-13 LAB — CBG MONITORING, ED: Glucose-Capillary: 108 mg/dL — ABNORMAL HIGH (ref 70–99)

## 2022-03-13 MED ORDER — CARVEDILOL 6.25 MG PO TABS
6.2500 mg | ORAL_TABLET | Freq: Two times a day (BID) | ORAL | Status: DC
  Administered 2022-03-14 – 2022-03-16 (×5): 6.25 mg via ORAL
  Filled 2022-03-13 (×4): qty 1
  Filled 2022-03-13: qty 2

## 2022-03-13 MED ORDER — IOHEXOL 300 MG/ML  SOLN
100.0000 mL | Freq: Once | INTRAMUSCULAR | Status: AC | PRN
  Administered 2022-03-13: 100 mL via INTRAVENOUS

## 2022-03-13 MED ORDER — ENOXAPARIN SODIUM 40 MG/0.4ML IJ SOSY
40.0000 mg | PREFILLED_SYRINGE | INTRAMUSCULAR | Status: DC
  Administered 2022-03-14 – 2022-03-16 (×3): 40 mg via SUBCUTANEOUS
  Filled 2022-03-13 (×4): qty 0.4

## 2022-03-13 MED ORDER — INSULIN ASPART 100 UNIT/ML IJ SOLN
0.0000 [IU] | Freq: Three times a day (TID) | INTRAMUSCULAR | Status: DC
  Administered 2022-03-14 – 2022-03-16 (×6): 2 [IU] via SUBCUTANEOUS

## 2022-03-13 MED ORDER — SODIUM CHLORIDE 0.9 % IV BOLUS
1000.0000 mL | Freq: Once | INTRAVENOUS | Status: AC
  Administered 2022-03-13: 1000 mL via INTRAVENOUS

## 2022-03-13 MED ORDER — SENNOSIDES-DOCUSATE SODIUM 8.6-50 MG PO TABS
2.0000 | ORAL_TABLET | Freq: Two times a day (BID) | ORAL | Status: DC
  Administered 2022-03-14 – 2022-03-16 (×6): 2 via ORAL
  Filled 2022-03-13 (×6): qty 2

## 2022-03-13 MED ORDER — OXYCODONE HCL 5 MG PO TABS: 5.0000 mg | ORAL_TABLET | Freq: Four times a day (QID) | ORAL | Status: DC | PRN

## 2022-03-13 MED ORDER — VANCOMYCIN HCL IN DEXTROSE 1-5 GM/200ML-% IV SOLN
1000.0000 mg | Freq: Two times a day (BID) | INTRAVENOUS | Status: DC
Start: 2022-03-14 — End: 2022-03-14
  Administered 2022-03-14: 1000 mg via INTRAVENOUS
  Filled 2022-03-13: qty 200

## 2022-03-13 MED ORDER — POLYETHYLENE GLYCOL 3350 17 G PO PACK: 17.0000 g | PACK | Freq: Every day | ORAL | Status: DC | PRN

## 2022-03-13 MED ORDER — EZETIMIBE 10 MG PO TABS
10.0000 mg | ORAL_TABLET | Freq: Every day | ORAL | Status: DC
  Administered 2022-03-14 – 2022-03-16 (×3): 10 mg via ORAL
  Filled 2022-03-13 (×3): qty 1

## 2022-03-13 MED ORDER — ONDANSETRON HCL 4 MG/2ML IJ SOLN: 4.0000 mg | Freq: Four times a day (QID) | INTRAMUSCULAR | Status: DC | PRN

## 2022-03-13 MED ORDER — VANCOMYCIN HCL 2000 MG/400ML IV SOLN
2000.0000 mg | Freq: Once | INTRAVENOUS | Status: AC
  Administered 2022-03-13: 2000 mg via INTRAVENOUS
  Filled 2022-03-13: qty 400

## 2022-03-13 MED ORDER — SODIUM CHLORIDE 0.9 % IV SOLN
2.0000 g | Freq: Three times a day (TID) | INTRAVENOUS | Status: DC
  Administered 2022-03-13 – 2022-03-15 (×5): 2 g via INTRAVENOUS
  Filled 2022-03-13 (×5): qty 12.5

## 2022-03-13 MED ORDER — BIOTIN W/ VITAMINS C & E 1250-7.5-7.5 MCG-MG-UNT PO CHEW: CHEWABLE_TABLET | Freq: Every day | ORAL | Status: DC

## 2022-03-13 MED ORDER — ONDANSETRON HCL 4 MG PO TABS: 4.0000 mg | ORAL_TABLET | Freq: Four times a day (QID) | ORAL | Status: DC | PRN

## 2022-03-13 MED ORDER — SACCHAROMYCES BOULARDII 250 MG PO CAPS
250.0000 mg | ORAL_CAPSULE | Freq: Two times a day (BID) | ORAL | Status: DC
  Administered 2022-03-14 – 2022-03-16 (×4): 250 mg via ORAL
  Filled 2022-03-13 (×7): qty 1

## 2022-03-13 MED ORDER — ACETAMINOPHEN 325 MG PO TABS: 650.0000 mg | ORAL_TABLET | Freq: Four times a day (QID) | ORAL | Status: DC | PRN

## 2022-03-13 MED ORDER — ALBUTEROL SULFATE (2.5 MG/3ML) 0.083% IN NEBU: 2.5000 mg | INHALATION_SOLUTION | RESPIRATORY_TRACT | Status: DC | PRN

## 2022-03-13 MED ORDER — VIBEGRON 75 MG PO TABS: 75.0000 mg | ORAL_TABLET | Freq: Every day | ORAL | Status: DC

## 2022-03-13 MED ORDER — PREGABALIN 75 MG PO CAPS
75.0000 mg | ORAL_CAPSULE | Freq: Three times a day (TID) | ORAL | Status: DC
  Administered 2022-03-14 – 2022-03-16 (×8): 75 mg via ORAL
  Filled 2022-03-13: qty 3
  Filled 2022-03-13 (×2): qty 1
  Filled 2022-03-13: qty 3
  Filled 2022-03-13 (×4): qty 1

## 2022-03-13 NOTE — Progress Notes (Deleted)
? ?  Argentina Ponder Kenlea Woodell,acting as a scribe for Lavon Paganini, MD.,have documented all relevant documentation on the behalf of Lavon Paganini, MD,as directed by  Lavon Paganini, MD while in the presence of Lavon Paganini, MD. ?  ? ? ?Established patient visit ? ? ?Patient: [name] [name]   DOB: [birth year]   71 y.o. Female  MRN: [id number] ?Visit Date: 03/13/2022 ? ?Today's healthcare provider: Lavon Paganini, MD  ? ?No chief complaint on file. ? ?Subjective  ?  ?HPI  ?*** ? ?Medications: ?Outpatient Medications Prior to Visit  ?Medication Sig  ? clindamycin (CLEOCIN) 300 MG capsule Take 1 capsule (300 mg total) by mouth 4 (four) times daily for 5 days.  ? acetaminophen (TYLENOL) 325 MG tablet Take 650 mg by mouth every 6 (six) hours as needed for moderate pain.  ? Biotin w/ Vitamins C & E (HAIR/SKIN/NAILS PO) Take 1 tablet by mouth daily.  ? buPROPion (WELLBUTRIN XL) 300 MG 24 hr tablet TAKE 1 TABLET BY MOUTH EVERY DAY (Patient taking differently: Take 300 mg by mouth daily.)  ? carvedilol (COREG) 6.25 MG tablet Take 1 tablet (6.25 mg total) by mouth 2 (two) times daily with a meal.  ? DULoxetine (CYMBALTA) 60 MG capsule Take 1 capsule (60 mg total) by mouth daily.  ? ezetimibe (ZETIA) 10 MG tablet TAKE 1 TABLET BY MOUTH EVERY DAY (Patient taking differently: Take 10 mg by mouth daily.)  ? metFORMIN (GLUCOPHAGE) 500 MG tablet TAKE 1 TABLET BY MOUTH 2 TIMES DAILY WITH A MEAL. (Patient taking differently: Take 500 mg by mouth 2 (two) times daily with a meal.)  ? oxyCODONE (OXY IR/ROXICODONE) 5 MG immediate release tablet Take 1 tablet (5 mg total) by mouth every 6 (six) hours as needed for severe pain.  ? polyethylene glycol (MIRALAX / GLYCOLAX) 17 g packet Take 17 g by mouth daily as needed for mild constipation.  ? pregabalin (LYRICA) 75 MG capsule Take 1 capsule (75 mg total) by mouth 3 (three) times daily.  ? saccharomyces boulardii (FLORASTOR) 250 MG capsule Take 1 capsule (250 mg total) by mouth 2  (two) times daily for 14 days.  ? senna-docusate (SENOKOT-S) 8.6-50 MG tablet Take 2 tablets by mouth 2 (two) times daily.  ? Vibegron (GEMTESA) 75 MG TABS Take 75 mg by mouth daily.  ? vitamin B-12 1000 MCG tablet Take 1 tablet (1,000 mcg total) by mouth daily.  ? ?No facility-administered medications prior to visit.  ? ? ?Review of Systems ? ?{Labs  Heme  Chem  Endocrine  Serology  Results Review (optional):23779} ?  Objective  ?  ?LMP  (LMP Unknown) Comment: age 4 ?{Show previous vital signs (optional):23777} ? ?Physical Exam  ?*** ? ?No results found for any visits on 03/13/22. ? Assessment & Plan  ?  ? ?*** ? ?No follow-ups on file.  ?   ? ?{provider attestation***:1} ? ? ?Lavon Paganini, MD  ?[facility] ?[phone] (phone) ?[phone] (fax) ? ?[facility] ?

## 2022-03-13 NOTE — Telephone Encounter (Signed)
Let's see if we can get a hold of her and recommend that she call her plastic surgeon about these complications.  It doesn't seem that she went to the ED last night.

## 2022-03-13 NOTE — Progress Notes (Signed)

## 2022-03-13 NOTE — Telephone Encounter (Signed)
Thank you for the update. If she is going to the ER, we can cancel today's appt for her and open up a slot.

## 2022-03-13 NOTE — ED Provider Notes (Signed)
?[facility] ?Provider Note ? ? ?CSN: [id number] ?Arrival date & time: 03/13/22  1250 ? ?  ? ?History ? ?Chief Complaint  ?Patient presents with  ? Fatigue  ? ? ?[name] [name] is a 71 y.o. female with history of type 2 diabetes, systolic heart failure, coronary disease with MI, hypertension, panniculectomy on 02/05/22 (Dr Dillhinger) c/b wound dehiscence, presented to the ED with complaint of purulent discharge from the wound and fevers at home.  The patient was discharged on 03/06/22 from the hospital, after readmission for abdominal wound dehiscence, and underwent on 4/2 excision of her abdominal wound with partial closure and wound vac placement.  Her MRSA PCR was positive and her blood cx grew MRSA and pasteurella.  Patient was discharged home on Augmentin and clindamycin, reports that she has taken his medications but finished them over the weekend, was not able to get to the pharmacy for refill of this prescription due to [place] weekend and she lives alone.  Her wound care nurse came out for the first time today, per the patient the nurse was supposed to come out for wound changes every 2 days but did not know the patient was home.  Patient reports there is been copious drainage from her wounds.  She reports objective fevers and chills at home. ? ?HPI ? ?  ? ?Home Medications ?Prior to Admission medications   ?Medication Sig Start Date End Date Taking? Authorizing Provider  ?acetaminophen (TYLENOL) 325 MG tablet Take 650 mg by mouth every 6 (six) hours as needed for moderate pain.    [provider]  ?Biotin w/ Vitamins C & E (HAIR/SKIN/NAILS PO) Take 1 tablet by mouth daily.    [provider]  ?buPROPion (WELLBUTRIN XL) 300 MG 24 hr tablet TAKE 1 TABLET BY MOUTH EVERY DAY ?Patient taking differently: Take 300 mg by mouth daily. 09/11/21   Virginia Crews, MD  ?carvedilol (COREG) 6.25 MG tablet Take 1 tablet (6.25 mg total) by mouth 2 (two) times daily  with a meal. 03/07/21   Bacigalupo, Dionne Bucy, MD  ?clindamycin (CLEOCIN) 300 MG capsule Take 1 capsule (300 mg total) by mouth 4 (four) times daily for 5 days. 03/12/22 03/17/22  Scheeler, Carola Rhine, PA-C  ?DULoxetine (CYMBALTA) 60 MG capsule Take 1 capsule (60 mg total) by mouth daily. 03/05/22   Virginia Crews, MD  ?ezetimibe (ZETIA) 10 MG tablet TAKE 1 TABLET BY MOUTH EVERY DAY ?Patient taking differently: Take 10 mg by mouth daily. 12/16/21   Virginia Crews, MD  ?metFORMIN (GLUCOPHAGE) 500 MG tablet TAKE 1 TABLET BY MOUTH 2 TIMES DAILY WITH A MEAL. ?Patient taking differently: Take 500 mg by mouth 2 (two) times daily with a meal. 10/20/21   Bacigalupo, Dionne Bucy, MD  ?oxyCODONE (OXY IR/ROXICODONE) 5 MG immediate release tablet Take 1 tablet (5 mg total) by mouth every 6 (six) hours as needed for severe pain. 03/06/22   Bonnielee Haff, MD  ?polyethylene glycol (MIRALAX / GLYCOLAX) 17 g packet Take 17 g by mouth daily as needed for mild constipation. 03/06/22   Bonnielee Haff, MD  ?pregabalin (LYRICA) 75 MG capsule Take 1 capsule (75 mg total) by mouth 3 (three) times daily. 03/05/22   Virginia Crews, MD  ?saccharomyces boulardii (FLORASTOR) 250 MG capsule Take 1 capsule (250 mg total) by mouth 2 (two) times daily for 14 days. 03/06/22 03/20/22  Bonnielee Haff, MD  ?senna-docusate (SENOKOT-S) 8.6-50 MG tablet Take 2 tablets by mouth 2 (  two) times daily. 03/06/22   Bonnielee Haff, MD  ?Vibegron [facility]) 75 MG TABS Take 75 mg by mouth daily. 06/19/21   Bjorn Loser, MD  ?vitamin B-12 1000 MCG tablet Take 1 tablet (1,000 mcg total) by mouth daily. 03/09/22   Bonnielee Haff, MD  ?   ? ?Allergies    ?Sulfa antibiotics, Latex, Doxycycline, and Empagliflozin   ? ?Review of Systems   ?Review of Systems ? ?Physical Exam ?Updated Vital Signs ?BP (!) 147/67 (BP Location: Right Arm)   Pulse 93   Temp 98.6 ?F (37 ?C) (Oral)   Resp 16   LMP  (LMP Unknown) Comment: age 19  SpO2 95%  ?Physical Exam ?Constitutional:    ?   General: She is not in acute distress. ?HENT:  ?   Head: Normocephalic and atraumatic.  ?Eyes:  ?   Conjunctiva/sclera: Conjunctivae normal.  ?   Pupils: Pupils are equal, round, and reactive to light.  ?Cardiovascular:  ?   Rate and Rhythm: Regular rhythm. Tachycardia present.  ?Pulmonary:  ?   Effort: Pulmonary effort is normal. No respiratory distress.  ?Abdominal:  ?   Comments: There is significant erythema of the abdominal wounds, some mild clear drainage, no frank pus from the wound, diffuse tenderness around the wound site ?Wound VAC was taken down prior to the patient's arrival  ?Skin: ?   General: Skin is warm and dry.  ?Neurological:  ?   General: No focal deficit present.  ?   Mental Status: She is alert. Mental status is at baseline.  ?Psychiatric:     ?   Mood and Affect: Mood normal.     ?   Behavior: Behavior normal.  ? ? ?ED Results / Procedures / Treatments   ?Labs ?(all labs ordered are listed, but only abnormal results are displayed) ?Labs Reviewed  ?COMPREHENSIVE METABOLIC PANEL - Abnormal; Notable for the following components:  ?    Result Value  ? Glucose, Bld 155 (*)   ? Calcium 8.8 (*)   ? Albumin 2.7 (*)   ? All other components within normal limits  ?CBC WITH DIFFERENTIAL/PLATELET - Abnormal; Notable for the following components:  ? WBC 10.8 (*)   ? RBC 3.85 (*)   ? Hemoglobin 9.7 (*)   ? HCT 31.7 (*)   ? MCH 25.2 (*)   ? Eosinophils Absolute 0.6 (*)   ? All other components within normal limits  ?CULTURE, BLOOD (ROUTINE X 2)  ?CULTURE, BLOOD (ROUTINE X 2)  ?LACTIC ACID, PLASMA  ?PROTIME-INR  ?URINALYSIS, ROUTINE W REFLEX MICROSCOPIC  ? ? ?EKG ?None ? ?Radiology ?DG Chest 2 View ? ?Result Date: 03/13/2022 ?CLINICAL DATA:  Suspected sepsis EXAM: CHEST - 2 VIEW COMPARISON:  02/15/2022 FINDINGS: The heart size and mediastinal contours are within normal limits. Both lungs are clear. No pleural effusion. The visualized skeletal structures are unremarkable. IMPRESSION: No acute process in  the chest. Electronically Signed   By: Macy Mis M.D.   On: 03/13/2022 14:14  ? ?CT ABDOMEN PELVIS W CONTRAST ? ?Result Date: 03/13/2022 ?CLINICAL DATA:  Fatigue and sepsis. EXAM: CT ABDOMEN AND PELVIS WITH CONTRAST TECHNIQUE: Multidetector CT imaging of the abdomen and pelvis was performed using the standard protocol following bolus administration of intravenous contrast. RADIATION DOSE REDUCTION: This exam was performed according to the departmental dose-optimization program which includes automated exposure control, adjustment of the mA and/or kV according to patient size and/or use of iterative reconstruction technique. CONTRAST:  19m OMNIPAQUE IOHEXOL 300  MG/ML  SOLN COMPARISON:  March 02, 2022 FINDINGS: Lower chest: No acute abnormality. Hepatobiliary: No focal liver abnormality is seen. A 2.0 cm gallstone is seen within the lumen of a markedly distended gallbladder. No evidence of biliary dilatation. Pancreas: Unremarkable. No pancreatic ductal dilatation or surrounding inflammatory changes. Spleen: Normal in size without focal abnormality. Adrenals/Urinary Tract: Adrenal glands are unremarkable. Kidneys are normal in size, without obstructing renal calculi, focal lesion, or hydronephrosis. Multiple 2 mm and 3 mm nonobstructing renal calculi are seen within the mid and lower right kidney. A 6 mm nonobstructing renal calculus is seen within the mid left kidney. Bladder is unremarkable. Stomach/Bowel: There is a small hiatal hernia. Appendix appears normal. No evidence of bowel wall thickening, distention, or inflammatory changes. Vascular/Lymphatic: Aortic atherosclerosis. No enlarged abdominal or pelvic lymph nodes. Reproductive: Status post hysterectomy. No adnexal masses. Other: There is evidence of prior panniculectomy, with a surgical drain seen entering the anterolateral aspect of the lower pelvic wall on the right. This extends along the anterior aspect of the anterior abdominal wall and pelvic  wall musculature. A mild to moderate amount of gas is again seen between the abdominal wall and fascia and subcutaneous fat. This is decreased in severity when compared to the prior exam. An area of focal wound

## 2022-03-13 NOTE — ED Provider Triage Note (Signed)
Emergency Medicine Provider Triage Evaluation Note ? ?[name] , a 71 y.o. female  was evaluated in triage.  Pt complains of infection.  She had a panniculectomy with infection complications.  She had a culture of her wound on her abdomen that showed MRSA.  She states yesterday she was having cold chills and generally not feeling well.  No fevers reported. ? ? ? ?Physical Exam  ?BP (!) 165/94 (BP Location: Left Arm)   Pulse (!) 107   Temp 98.8 ?F (37.1 ?C) (Oral)   Resp 16   LMP  (LMP Unknown) Comment: age 53  SpO2 100%  ?Gen:   Awake, no distress   ?Resp:  Normal effort  ?MSK:   Moves extremities without difficulty  ?Other:  Normal speech.  ? ?Medical Decision Making  ?Medically screening exam initiated at 1:35 PM.  Appropriate orders placed.  [name] was informed that the remainder of the evaluation will be completed by another provider, this initial triage assessment does not replace that evaluation, and the importance of remaining in the ED until their evaluation is complete. ? ?Note: Portions of this report may have been transcribed using voice recognition software. Every effort was made to ensure accuracy; however, inadvertent computerized transcription errors may be present ? ?  ?Lorin Glass, [place] ?03/13/22 1336 ? ?

## 2022-03-13 NOTE — H&P (Signed)
?History and Physical  ? ? ?[name] [name] MKL:[id number] DOB: 02-19-1951 DOA: 03/13/2022 ? ?PCP: Virginia Crews, MD  ?Patient coming from: home ? ?I have personally briefly reviewed patient's old medical records in [place] ? ?Chief Complaint: recurrent wound infection / med noncomplaince ? ?HPI: [name] [name] is a 71 y.o. female with medical history significant for diabetes mellitus x2, fibromyalgia, congestive heart failure, coronary artery disease with prior myocardial infarction, hypertension, hyperlipidemia, previous uterine cancer and recent panniculectomy for panniculitis cysts by Dr. Royce Macadamia on 05/11/78 complicated by wound dehiscence and infection with abscess formation for which patient was admitted on 3/31. During that admission patient was seen by Dr Marla Roe from plastic surgery and underwent excision surgery and wound VAC placement on 4/2 and discharged on 4/4 with antibiotic course  ?Augmentin and Clindamycin. Patient was discharged with home health for wound care as well. Patient however has been noncompliant with her care and has not taken any antibiotics since discharge. In addition patient wound vac  became dislodged yesterday at which time she alerted her pcp and surgeon. Patient was also seen by wound care home visit today  as there was a delayed in scheduling s/p discharge until day of presentation. On visit home health RN noted worsening infection. RN and PCP agreed patient need referral to ED. Patient currently in ed noted that she has been well other than noted significant drainage from wound. She note no increase pain and noted low grade tem of 100 at home today for which she took tylenol. She denies any chills, n/v/d /or presyncope. ON further ros she noted no chest pain or sob.  ? ? ?ED Course:  ?Afeb, BP 165/94, hr 107, rr 16 ?Notable labs: ?Wbc 10.8, hgb 9.7 recent base 8.8, plt 354 ?Na :137, cr 0.79, latic 1.7 ?Cxr: NAD ?CTAB: no abscess noted ,Persistent area of wound  dehiscence with interval decrease in gas ? 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 ?TX VANC ? ?Review of Systems: As per HPI otherwise 10 point review of systems negative.  ? ?Past Medical History:  ?Diagnosis Date  ? Anxiety   ? Arthritis   ? Rhumetoid arthritis  ? BRCA negative 07/2017  ? MyRisk neg  ? Cancer [facility])   ? uterine  ? Cellulitis of both lower extremities   ? Chronic  ? CHF (congestive heart failure) ([place])   ? Coronary artery disease   ? Depression   ? Diabetes mellitus without complication ([place])   ? Family history of breast cancer 07/2017  ? MyRisk neg; IBIS=10.5%/riskscore=17%  ? Family history of ovarian cancer   ? Fibromyalgia   ? Fibromyalgia affecting shoulder region   ? Headache   ? Hyperlipidemia   ? Hypertension   ? Spinal stenosis   ? ? ?Past Surgical History:  ?Procedure Laterality Date  ? ABLATION SAPHENOUS VEIN W/ RFA    ? ANAL FISSURECTOMY  01/2004  ? COLON SURGERY    ? CORONARY ANGIOPLASTY    ? CYSTOSCOPY  09/03/2017  ? Procedure: CYSTOSCOPY;  Surgeon: Gae Dry, MD;  Location: [facility];  Service: Gynecology;;  ? DILATION AND CURETTAGE OF UTERUS    ? ESOPHAGEAL DILATION  02/20/2022  ? Procedure: ESOPHAGEAL DILATION;  Surgeon: Carol Ada, MD;  Location: [place];  Service: Gastroenterology;;  savory  ? ESOPHAGOGASTRODUODENOSCOPY (EGD) WITH PROPOFOL N/A 02/20/2022  ? Procedure: ESOPHAGOGASTRODUODENOSCOPY (EGD) WITH PROPOFOL;  Surgeon: Carol Ada, MD;  Location: [place];  Service: Gastroenterology;  Laterality: N/A;  Dyspahgia  ? EYE SURGERY Bilateral 08/2020  ? eye lift  ? HERNIA REPAIR    ? INCISION AND DRAINAGE OF WOUND  03/04/2022  ? Procedure: IRRIGATION AND DEBRIDEMENT ABDOMEN WITH VAC PLACEMENT, MYRIAD PLACEMENT;  Surgeon: Wallace Going, DO;  Location: [place];  Service: Plastics;;  ? LAPAROSCOPIC HYSTERECTOMY N/A 09/03/2017  ? Procedure: HYSTERECTOMY TOTAL LAPAROSCOPIC BSO;  Surgeon: Gae Dry, MD;  Location: [facility];  Service: Gynecology;  Laterality:  N/A;  ? MEDIAL PARTIAL KNEE REPLACEMENT Left 01/06/2014  ? PANNICULECTOMY N/A 02/05/2022  ? Procedure: PANNICULECTOMY;  Surgeon: Wallace Going, DO;  Location: [place];  Service: Plastics;  Laterality: N/A;  3 hours  ? UMBILICAL HERNIA REPAIR N/A 09/05/2015  ? Procedure: HERNIA REPAIR incarcerated UMBILICAL ADULT;  Surgeon: Sherri Rad, MD;  Location: [facility];  Service: General;  Laterality: N/A;  ? Vascular Stent  11/04/2013  ? ? ? reports that she quit smoking about 25 years ago. Her smoking use included cigarettes. She has a 29.00 pack-year smoking history. She has never used smokeless tobacco. She reports that she does not drink alcohol and does not use drugs. ? ?Allergies  ?Allergen Reactions  ? Sulfa Antibiotics Itching, Rash and Swelling  ? Latex Itching  ? Doxycycline Rash  ? Empagliflozin Itching and Other (See Comments)  ?  Itching and frequent yeast infections  ? ? ?Family History  ?Problem Relation Age of Onset  ? Hypertension Mother   ? Cataracts Mother   ? Thyroid disease Mother   ? Dementia Mother   ? COPD Father   ? Dementia Father   ? Cataracts Father   ? Aneurysm Father   ?     Abdominal & Brain  ? Diabetes Father   ? Melanoma Father   ? Breast cancer Sister 51  ? Fibromyalgia Sister   ? Migraines Sister   ? Hypertension Sister   ? Breast cancer Sister 25  ? COPD Sister   ? Ovarian cancer Sister 50  ? Migraines Brother   ? Heart attack Brother   ? Colon cancer Paternal Uncle   ?     60s  ? Colon cancer Paternal Uncle   ?     95s  ? Uterine cancer Cousin   ? Uterine cancer Cousin   ? ? ?Prior to Admission medications   ?Medication Sig Start Date End Date Taking? Authorizing Provider  ?acetaminophen (TYLENOL) 325 MG tablet Take 650 mg by mouth every 6 (six) hours as needed for moderate pain.    [provider]  ?Biotin w/ Vitamins C & E (HAIR/SKIN/NAILS PO) Take 1 tablet by mouth daily.    [provider]  ?buPROPion (WELLBUTRIN XL) 300 MG 24 hr tablet TAKE 1 TABLET BY MOUTH EVERY  DAY ?Patient taking differently: Take 300 mg by mouth daily. 09/11/21   Virginia Crews, MD  ?carvedilol (COREG) 6.25 MG tablet Take 1 tablet (6.25 mg total) by mouth 2 (two) times daily with a meal. 03/07/21   Bacigalupo, Dionne Bucy, MD  ?clindamycin (CLEOCIN) 300 MG capsule Take 1 capsule (300 mg total) by mouth 4 (four) times daily for 5 days. 03/12/22 03/17/22  Scheeler, Carola Rhine, PA-C  ?DULoxetine (CYMBALTA) 60 MG capsule Take 1 capsule (60 mg total) by mouth daily. 03/05/22   Virginia Crews, MD  ?ezetimibe (ZETIA) 10 MG tablet TAKE 1 TABLET BY MOUTH EVERY DAY ?Patient taking differently: Take 10 mg by mouth daily. 12/16/21   Virginia Crews, MD  ?  metFORMIN (GLUCOPHAGE) 500 MG tablet TAKE 1 TABLET BY MOUTH 2 TIMES DAILY WITH A MEAL. ?Patient taking differently: Take 500 mg by mouth 2 (two) times daily with a meal. 10/20/21   Bacigalupo, Dionne Bucy, MD  ?oxyCODONE (OXY IR/ROXICODONE) 5 MG immediate release tablet Take 1 tablet (5 mg total) by mouth every 6 (six) hours as needed for severe pain. 03/06/22   Bonnielee Haff, MD  ?polyethylene glycol (MIRALAX / GLYCOLAX) 17 g packet Take 17 g by mouth daily as needed for mild constipation. 03/06/22   Bonnielee Haff, MD  ?pregabalin (LYRICA) 75 MG capsule Take 1 capsule (75 mg total) by mouth 3 (three) times daily. 03/05/22   Virginia Crews, MD  ?saccharomyces boulardii (FLORASTOR) 250 MG capsule Take 1 capsule (250 mg total) by mouth 2 (two) times daily for 14 days. 03/06/22 03/20/22  Bonnielee Haff, MD  ?senna-docusate (SENOKOT-S) 8.6-50 MG tablet Take 2 tablets by mouth 2 (two) times daily. 03/06/22   Bonnielee Haff, MD  ?Vibegron [facility]) 75 MG TABS Take 75 mg by mouth daily. 06/19/21   Bjorn Loser, MD  ?vitamin B-12 1000 MCG tablet Take 1 tablet (1,000 mcg total) by mouth daily. 03/09/22   Bonnielee Haff, MD  ? ? ?Physical Exam: ?Vitals:  ? 03/13/22 1255 03/13/22 1747  ?BP: (!) 165/94 (!) 147/67  ?Pulse: (!) 107 93  ?Resp: 16 16  ?Temp: 98.8 ?F (37.1  ?C) 98.6 ?F (37 ?C)  ?TempSrc: Oral Oral  ?SpO2: 100% 95%  ? ? ? ?Vitals:  ? 03/13/22 1255 03/13/22 1747  ?BP: (!) 165/94 (!) 147/67  ?Pulse: (!) 107 93  ?Resp: 16 16  ?Temp: 98.8 ?F (37.1 ?C) 98.6 ?F (37

## 2022-03-13 NOTE — ED Triage Notes (Signed)
Patient BIB [facility] from home, patient states she was called by her surgeon and told she has a MRSA infection after having skin removed on March 6 after weight loss. Patient is alert, oriented, and in no apparent distress at this time. 22g saline lock in right forearm. ? ?EMS Vitals ?CBG 213 ? ?

## 2022-03-13 NOTE — ED Notes (Signed)
IV insertion attempted without success, IV team consult be requested. ?

## 2022-03-13 NOTE — Progress Notes (Signed)
Pharmacy Antibiotic Note ? ?[name] [name] is a 71 y.o. female admitted on 03/13/2022 with  wound infection .  Pharmacy has been consulted for vancomycin dosing. WBC mildly elevated, SCr wnl. Of note, patient had a recent wound culture positive for MRSA.  ? ?Plan: ?-Vancomycin 2 gm IV load followed by Vancomycin 1000 mg IV Q 12 hrs. Goal AUC 400-550. ?Expected AUC: 480 ?SCr used: 0.79 ?-Monitor CBC, renal fx, cultures and clinical progress ?-Vanc levels as indicated  ? ? ?  ? ?Temp (24hrs), Avg:98.8 ?F (37.1 ?C), Min:98.8 ?F (37.1 ?C), Max:98.8 ?F (37.1 ?C) ? ?Recent Labs  ?Lab 03/13/22 ?1345  ?WBC 10.8*  ?CREATININE 0.79  ?LATICACIDVEN 1.7  ?  ?Estimated Creatinine Clearance: 85.6 mL/min (by C-G formula based on SCr of 0.79 mg/dL).   ? ?Allergies  ?Allergen Reactions  ? Sulfa Antibiotics Itching, Rash and Swelling  ? Latex Itching  ? Doxycycline Rash  ? Empagliflozin Itching and Other (See Comments)  ?  Itching and frequent yeast infections  ? ? ?Antimicrobials this admission: ?Vancomycin 4/11 >>  ? ?Dose adjustments this admission: ? ?Microbiology results: ?4/11 BCx:  ? ? ?Thank you for allowing pharmacy to be a part of this patient?s care. ? ?Albertina Parr, PharmD., BCCCP ?Clinical Pharmacist ?Please refer to AMION for unit-specific pharmacist  ? ? ?

## 2022-03-13 NOTE — Progress Notes (Signed)
Pharmacy Antibiotic Note ? ?[name] [name] is a 71 y.o. female admitted on 03/13/2022 with  wound infection .  Pharmacy has been consulted for cefepime dosing. ? ?WBC slightly elevated at 10.8. No fever noted on presentation. ? ?Plan: ?Cefepime 2 g IV q8h  ?Continue vancomycin 1000 mg IV q12h  ?F/u clinical course, renal function, cultures, fever curve ?  ? ?Temp (24hrs), Avg:98.7 ?F (37.1 ?C), Min:98.6 ?F (37 ?C), Max:98.8 ?F (37.1 ?C) ? ?Recent Labs  ?Lab 03/13/22 ?1345  ?WBC 10.8*  ?CREATININE 0.79  ?LATICACIDVEN 1.7  ?  ?Estimated Creatinine Clearance: 85.6 mL/min (by C-G formula based on SCr of 0.79 mg/dL).   ? ?Allergies  ?Allergen Reactions  ? Sulfa Antibiotics Itching, Rash and Swelling  ? Latex Itching  ? Doxycycline Rash  ? Empagliflozin Itching and Other (See Comments)  ?  Itching and frequent yeast infections  ? ? ?Antimicrobials this admission: ?Vancomycin 4/11 >>  ?Cefepime 4/11 >>  ? ?Dose adjustments this admission: ?N/A ? ?Microbiology results: ?4/11 bcx: in process ? ?Thank you for allowing pharmacy to be a part of this patient?s care. ? ?Zenaida Deed, PharmD ?PGY1 Acute Care Pharmacy Resident  ?Phone: [phone] ?03/13/2022  9:58 PM ? ?Please check [email or web address] for unit-specific pharmacy phone numbers. ? ? ?

## 2022-03-14 DIAGNOSIS — T148XXA Other injury of unspecified body region, initial encounter: Secondary | ICD-10-CM | POA: Diagnosis not present

## 2022-03-14 DIAGNOSIS — L089 Local infection of the skin and subcutaneous tissue, unspecified: Secondary | ICD-10-CM | POA: Diagnosis not present

## 2022-03-14 LAB — GLUCOSE, CAPILLARY
Glucose-Capillary: 120 mg/dL — ABNORMAL HIGH (ref 70–99)
Glucose-Capillary: 187 mg/dL — ABNORMAL HIGH (ref 70–99)
Glucose-Capillary: 185 mg/dL — ABNORMAL HIGH (ref 70–99)

## 2022-03-14 LAB — COMPREHENSIVE METABOLIC PANEL
ALT: 9 U/L (ref 0–44)
AST: 11 U/L — ABNORMAL LOW (ref 15–41)
Albumin: 2.1 g/dL — ABNORMAL LOW (ref 3.5–5.0)
Alkaline Phosphatase: 52 U/L (ref 38–126)
Anion gap: 5 (ref 5–15)
BUN: 12 mg/dL (ref 8–23)
CO2: 25 mmol/L (ref 22–32)
Calcium: 7.7 mg/dL — ABNORMAL LOW (ref 8.9–10.3)
Chloride: 107 mmol/L (ref 98–111)
Creatinine, Ser: 0.7 mg/dL (ref 0.44–1.00)
GFR, Estimated: >60 mL/min (ref >60)
Glucose, Bld: 199 mg/dL — ABNORMAL HIGH (ref 70–99)
Potassium: 3.5 mmol/L (ref 3.5–5.1)
Sodium: 137 mmol/L (ref 135–145)
Total Bilirubin: 0.4 mg/dL (ref 0.3–1.2)
Total Protein: 5.5 g/dL — ABNORMAL LOW (ref 6.5–8.1)

## 2022-03-14 LAB — CBC
HCT: 24.9 % — ABNORMAL LOW (ref 36.0–46.0)
Hemoglobin: 7.7 g/dL — ABNORMAL LOW (ref 12.0–15.0)
MCH: 25.3 pg — ABNORMAL LOW (ref 26.0–34.0)
MCHC: 30.9 g/dL (ref 30.0–36.0)
MCV: 81.9 fL (ref 80.0–100.0)
Platelets: 237 10*3/uL (ref 150–400)
RBC: 3.04 MIL/uL — ABNORMAL LOW (ref 3.87–5.11)
RDW: 15.4 % (ref 11.5–15.5)
WBC: 8.3 10*3/uL (ref 4.0–10.5)
nRBC: 0 % (ref 0.0–0.2)

## 2022-03-14 LAB — CBG MONITORING, ED
Glucose-Capillary: 149 mg/dL — ABNORMAL HIGH (ref 70–99)
Glucose-Capillary: 150 mg/dL — ABNORMAL HIGH (ref 70–99)

## 2022-03-14 MED ORDER — VANCOMYCIN HCL IN DEXTROSE 1-5 GM/200ML-% IV SOLN
1000.0000 mg | INTRAVENOUS | Status: DC
Start: 2022-03-15 — End: 2022-03-16
  Administered 2022-03-15 – 2022-03-16 (×2): 1000 mg via INTRAVENOUS
  Filled 2022-03-14 (×2): qty 200

## 2022-03-14 NOTE — Consult Note (Signed)
[facility] Nurse Consult Note: ?Reason for Consult: wound vac placement ?Wound type: surgical followed by Dr Marla Roe.  ?The following procedure was performed by Dr. Marla Roe on 03/04/22: ?Excision of abdominal wound 6 x 15 cm skin and soft tissue ?Partial closure of the wound 6 cm ?Placement of Myriad powder 2 gm and sheet 7 x 10 cm ?VAC placement ? ?[place] sent to confirm wound care orders for nursing from Dr. Marla Roe. WOC will stand by if we are needed.  ? ?Thank you for the consult. [facility] nurse will not follow at this time.   ?Please re-consult the [facility] team if needed. ? ?Cathlean Marseilles. Tamala Julian, MSN, RN, CMSRN, AGCNS, WTA ?Wound Treatment Associate ?Pager [phone]   ? ? ? ? ?  ?

## 2022-03-14 NOTE — ED Notes (Signed)
Pt ambulatory to restroom independently ?Gown changed and new dressing applied to wound   ?

## 2022-03-14 NOTE — ED Notes (Signed)
ED TO INPATIENT HANDOFF REPORT ? ?ED Nurse Name and Phone #:   ? ?S ?Name/Age/Gender ?[name] [name] ?71 y.o. ?female ?Room/Bed: H021C/H021C ? ?Code Status ?  Code Status: Full Code ? ?Home/SNF/Other ?Home ?Patient oriented to: self, place, time, and situation ?Is this baseline? Yes  ? ?   ?Chief Complaint ?Wound infection [T14.8XXA, L08.9] ? ?Triage Note ?Patient BIB [name] from home, patient states she was called by her surgeon and told she has a MRSA infection after having skin removed on March 6 after weight loss. Patient is alert, oriented, and in no apparent distress at this time. 22g saline lock in right forearm. ? ?EMS Vitals ?CBG 213 ?  ? ?Allergies ?Allergies  ?Allergen Reactions  ? Sulfa Antibiotics Itching, Rash and Swelling  ? Latex Itching  ? Doxycycline Rash  ? Empagliflozin Itching and Other (See Comments)  ?  Itching and frequent yeast infections  ? ? ?Level of Care/Admitting Diagnosis ?ED Disposition   ? ? ED Disposition  ?Admit  ? Condition  ?--  ? Comment  ?[facility] [[id number]] ? Level of Care: Telemetry Medical [104] ? May admit patient to [facility] or [facility] if equivalent level of care is available:: Yes ? Covid Evaluation: Asymptomatic - no recent exposure (last 10 days) testing not required ? Diagnosis: Wound infection [[id number]] ? Admitting Physician: Clance Boll [[id number]] ? Attending Physician: Myles Rosenthal A [[id number]] ? Estimated length of stay: past midnight tomorrow ? Certification:: I certify this patient will need inpatient services for at least 2 midnights ?  ?  ? ?  ? ? ?B ?Medical/Surgery History ?Past Medical History:  ?Diagnosis Date  ? Anxiety   ? Arthritis   ? Rhumetoid arthritis  ? BRCA negative 07/2017  ? MyRisk neg  ? Cancer [facility])   ? uterine  ? Cellulitis of both lower extremities   ? Chronic  ? CHF (congestive heart failure) ([place])   ? Coronary artery disease   ? Depression   ? Diabetes mellitus without complication ([place])    ? Family history of breast cancer 07/2017  ? MyRisk neg; IBIS=10.5%/riskscore=17%  ? Family history of ovarian cancer   ? Fibromyalgia   ? Fibromyalgia affecting shoulder region   ? Headache   ? Hyperlipidemia   ? Hypertension   ? Spinal stenosis   ? ?Past Surgical History:  ?Procedure Laterality Date  ? ABLATION SAPHENOUS VEIN W/ RFA    ? ANAL FISSURECTOMY  01/2004  ? COLON SURGERY    ? CORONARY ANGIOPLASTY    ? CYSTOSCOPY  09/03/2017  ? Procedure: CYSTOSCOPY;  Surgeon: Gae Dry, MD;  Location: [facility];  Service: Gynecology;;  ? DILATION AND CURETTAGE OF UTERUS    ? ESOPHAGEAL DILATION  02/20/2022  ? Procedure: ESOPHAGEAL DILATION;  Surgeon: Carol Ada, MD;  Location: [place];  Service: Gastroenterology;;  savory  ? ESOPHAGOGASTRODUODENOSCOPY (EGD) WITH PROPOFOL N/A 02/20/2022  ? Procedure: ESOPHAGOGASTRODUODENOSCOPY (EGD) WITH PROPOFOL;  Surgeon: Carol Ada, MD;  Location: [place];  Service: Gastroenterology;  Laterality: N/A;  Dyspahgia  ? EYE SURGERY Bilateral 08/2020  ? eye lift  ? HERNIA REPAIR    ? INCISION AND DRAINAGE OF WOUND  03/04/2022  ? Procedure: IRRIGATION AND DEBRIDEMENT ABDOMEN WITH VAC PLACEMENT, MYRIAD PLACEMENT;  Surgeon: Wallace Going, DO;  Location: [place];  Service: Plastics;;  ? LAPAROSCOPIC HYSTERECTOMY N/A 09/03/2017  ? Procedure: HYSTERECTOMY TOTAL LAPAROSCOPIC BSO;  Surgeon: Gae Dry, MD;  Location:  [facility];  Service: Gynecology;  Laterality: N/A;  ? MEDIAL PARTIAL KNEE REPLACEMENT Left 01/06/2014  ? PANNICULECTOMY N/A 02/05/2022  ? Procedure: PANNICULECTOMY;  Surgeon: Wallace Going, DO;  Location: [place];  Service: Plastics;  Laterality: N/A;  3 hours  ? UMBILICAL HERNIA REPAIR N/A 09/05/2015  ? Procedure: HERNIA REPAIR incarcerated UMBILICAL ADULT;  Surgeon: Sherri Rad, MD;  Location: [facility];  Service: General;  Laterality: N/A;  ? Vascular Stent  11/04/2013  ?  ? ?A ?IV Location/Drains/Wounds ?Patient Lines/Drains/Airways Status   ? ? Active  Line/Drains/Airways   ? ? Name Placement date Placement time Site Days  ? Peripheral IV 03/13/22 20 G Other (Comment) Left Forearm 03/13/22  1919  Forearm  1  ? Closed System Drain 1 Right RLQ Bulb (JP) 19 Fr. 03/04/22  0829  RLQ  10  ? Negative Pressure Wound Therapy Abdomen 02/05/22  0943  --  37  ? Negative Pressure Wound Therapy Abdomen Lower;Medial;Right 03/04/22  0831  --  10  ? External Urinary Catheter 02/19/22  0400  --  23  ? Incision (Closed) 02/05/22 Abdomen 02/05/22  0933  -- 37  ? Incision (Closed) 03/04/22 Abdomen 03/04/22  0847  -- 10  ? Wound / Incision (Open or Dehisced) 02/16/22 Puncture Hip Anterior;Right;Lateral Puncture site from old JP site 02/16/22  1544  Hip  26  ? Wound / Incision (Open or Dehisced) 03/03/22 Incision - Dehisced Abdomen Lower;Medial 03/03/22  0530  Abdomen  11  ? ?  ?  ? ?  ? ? ?Intake/Output Last 24 hours ? ?Intake/Output Summary (Last 24 hours) at 03/14/2022 1522 ?Last data filed at 03/14/2022 4268 ?Gross per 24 hour  ?Intake 1600.43 ml  ?Output --  ?Net 1600.43 ml  ? ? ?Labs/Imaging ?Results for orders placed or performed during the hospital encounter of 03/13/22 (from the past 48 hour(s))  ?Culture, blood (Routine x 2)     Status: None (Preliminary result)  ? Collection Time: 03/13/22 12:59 PM  ? Specimen: BLOOD  ?Result Value Ref Range  ? Specimen Description BLOOD SITE NOT SPECIFIED   ? Special Requests    ?  BOTTLES DRAWN AEROBIC AND ANAEROBIC Blood Culture results may not be optimal due to an inadequate volume of blood received in culture bottles  ? Culture    ?  NO GROWTH < 24 HOURS ?Performed at [facility], [place] [address], [place], [place] [address] ?  ? Report Status PENDING   ?Comprehensive metabolic panel     Status: Abnormal  ? Collection Time: 03/13/22  1:45 PM  ?Result Value Ref Range  ? Sodium 137 135 - 145 mmol/L  ? Potassium 4.2 3.5 - 5.1 mmol/L  ? Chloride 102 98 - 111 mmol/L  ? CO2 24 22 - 32 mmol/L  ? Glucose, Bld 155 (H) 70 - 99 mg/dL  ?   Comment: Glucose reference range applies only to samples taken after fasting for at least 8 hours.  ? BUN 16 8 - 23 mg/dL  ? Creatinine, Ser 0.79 0.44 - 1.00 mg/dL  ? Calcium 8.8 (L) 8.9 - 10.3 mg/dL  ? Total Protein 7.0 6.5 - 8.1 g/dL  ? Albumin 2.7 (L) 3.5 - 5.0 g/dL  ? AST 15 15 - 41 U/L  ? ALT 11 0 - 44 U/L  ? Alkaline Phosphatase 70 38 - 126 U/L  ? Total Bilirubin 0.4 0.3 - 1.2 mg/dL  ? GFR, Estimated >60 >60 mL/min  ?  Comment: (  NOTE) ?Calculated using the CKD-EPI Creatinine Equation (2021) ?  ? Anion gap 11 5 - 15  ?  Comment: Performed at [facility], [place] [address] [place], [place] [address]  ?Lactic acid, plasma     Status: None  ? Collection Time: 03/13/22  1:45 PM  ?Result Value Ref Range  ? Lactic Acid, Venous 1.7 0.5 - 1.9 mmol/L  ?  Comment: Performed at [facility], [place] [address] [place], [place] [address]  ?CBC with Differential     Status: Abnormal  ? Collection Time: 03/13/22  1:45 PM  ?Result Value Ref Range  ? WBC 10.8 (H) 4.0 - 10.5 K/uL  ? RBC 3.85 (L) 3.87 - 5.11 MIL/uL  ? Hemoglobin 9.7 (L) 12.0 - 15.0 g/dL  ? HCT 31.7 (L) 36.0 - 46.0 %  ? MCV 82.3 80.0 - 100.0 fL  ? MCH 25.2 (L) 26.0 - 34.0 pg  ? MCHC 30.6 30.0 - 36.0 g/dL  ? RDW 15.2 11.5 - 15.5 %  ? Platelets 354 150 - 400 K/uL  ? nRBC 0.0 0.0 - 0.2 %  ? Neutrophils Relative % 69 %  ? Neutro Abs 7.5 1.7 - 7.7 K/uL  ? Lymphocytes Relative 18 %  ? Lymphs Abs 1.9 0.7 - 4.0 K/uL  ? Monocytes Relative 6 %  ? Monocytes Absolute 0.7 0.1 - 1.0 K/uL  ? Eosinophils Relative 5 %  ? Eosinophils Absolute 0.6 (H) 0.0 - 0.5 K/uL  ? Basophils Relative 1 %  ? Basophils Absolute 0.1 0.0 - 0.1 K/uL  ? Immature Granulocytes 1 %  ? Abs Immature Granulocytes 0.05 0.00 - 0.07 K/uL  ?  Comment: Performed at [facility], [place] [address], [place], [place] [address]  ?Protime-INR     Status: None  ? Collection Time: 03/13/22  1:45 PM  ?Result Value Ref Range  ? Prothrombin Time 13.7 11.4 - 15.2 seconds  ? INR 1.1 0.8 - 1.2  ?  Comment:  (NOTE) ?INR goal varies based on device and disease states. ?Performed at [facility], [place] [address] [place], [place] ?[address] ?  ?Culture, blood (Routine x 2)     Status: None (Preliminary result)  ?

## 2022-03-14 NOTE — ED Notes (Signed)
Pt assisted to a seated position to eat breakfast. Pt is alert and oriented. Respirations are even and non-labored. NAD ? ? ?

## 2022-03-14 NOTE — Progress Notes (Signed)
Pharmacy Antibiotic Note ? ?[name] [name] is a 71 y.o. female admitted on 03/13/2022 with  wound infection .  Pharmacy has been consulted for cefepime dosing. Pt is afebrile and WBC is WNL. Scr is WNL. Pt was recently on vancomycin with levels drawn during her admission a few weeks ago.  ? ?Plan: ?Change vancomycin to 1gm IV Q24H based on levels drawn during recent admission ?Continue cefepime 2gm IV Q8H ?F/u renal fxn, C&S, clinical status and peak/trough at [facility]  ? ?Temp (24hrs), Avg:98.6 ?F (37 ?C), Min:98 ?F (36.7 ?C), Max:99 ?F (37.2 ?C) ? ?Recent Labs  ?Lab 03/13/22 ?1345 03/14/22 ?0350  ?WBC 10.8* 8.3  ?CREATININE 0.79 0.70  ?LATICACIDVEN 1.7  --   ? ?  ?Estimated Creatinine Clearance: 85.6 mL/min (by C-G formula based on SCr of 0.7 mg/dL).   ? ?Allergies  ?Allergen Reactions  ? Sulfa Antibiotics Itching, Rash and Swelling  ? Latex Itching  ? Doxycycline Rash  ? Empagliflozin Itching and Other (See Comments)  ?  Itching and frequent yeast infections  ? ? ?Antimicrobials this admission: ?Vancomycin 4/11 >>  ?Cefepime 4/11 >>  ? ?Dose adjustments this admission: ?4/12 Changed to 1g Q24H based on levels from 4/3 and 4/4 ? ?Microbiology results: ?4/11 bcx: NGTD ? ?Thank you for allowing pharmacy to be a part of this patient?s care. ? ?Salome Arnt, PharmD, BCPS ?Clinical Pharmacist ?Please see AMION for all pharmacy numbers ?03/14/2022 8:52 AM ? ? ?

## 2022-03-14 NOTE — Progress Notes (Signed)
?PROGRESS NOTE ? ? ? ?TIMOTHY TOWNSEL  QQV:[id number] DOB: 03-17-1951 DOA: 03/13/2022 ?PCP: Virginia Crews, MD  ? ? ?Brief Narrative:  ?71 year old with history of type 2 diabetes, fibromyalgia, coronary artery disease with prior MI, hypertension, hyperlipidemia ?Panniculectomy 3/6 complicated by wound dehiscence and infection with abscess ?3/15-3/24, admitted with postop complication, JP drain dislodgment and discharged with antibiotics. ?3/31-4/4, admitted with wound dehiscence, purulent drainage, chills and fatigue with abdominal discomfort status post surgical excision of the wound, wound VAC placement and discharged on oral antibiotics. ?Not taking oral antibiotics as she did not have any transportation to pick up medicine. ?Wound VAC fell off, home care nurse sent her to ER. ? ? ? ?Assessment & Plan: ?  ?Postop wound dehiscence with persistent infection: ?Previous wound cultures with Eikenella and recent wound culture with rare MRSA.  Prescribed Augmentin and clindamycin but patient did not take it. ?Does have evidence of wound dehiscence with some surrounding inflammation, less likely will need surgery. ?Message was sent to Dr. Marla Roe for follow-up if any surgical intervention or debridement needed. ?Continue vancomycin today.  Will transition to clindamycin on discharge tomorrow.  Will need assistance to get medications with her. ?Consult wound care team, wound VAC to be fitted unless any surgeries planned. ? ?Type 2 diabetes: Holding metformin, keep on sliding scale insulin. ? ?Fibromyalgia: On Lyrica. ? ?Chronic diastolic congestive heart failure/coronary artery disease/hypertension/hyperlipidemia: ?Stable on carvedilol, Zetia. ? ? ?DVT prophylaxis: enoxaparin (LOVENOX) injection 40 mg Start: 03/14/22 1000 ? ? ?Code Status: Full code ?Family Communication: None ?Disposition Plan: Status is: Inpatient ?Remains inpatient appropriate because: Wound infection, IV antibiotics ?  ? ? ?Consultants:   ?Plastic surgery, notified ? ?Procedures:  ?None ? ?Antimicrobials:  ?Vancomycin and cefepime 4/11--- ? ? ?Subjective: ?Patient seen and examined.  In the hallway of emergency room.  Denies any abdominal pain nausea vomiting. ?Just uncomfortable sitting in the hallway all night.  Afebrile.  She was worried about induration on her wound. ? ?Objective: ?Vitals:  ? 03/13/22 2230 03/14/22 0237 03/14/22 0538 03/14/22 1100  ?BP: (!) 138/51 138/60 (!) 142/72 (!) 130/44  ?Pulse: 92 89 78 65  ?Resp: '20 16 17 17  '$ ?Temp: 99 ?F (37.2 ?C) 98 ?F (36.7 ?C)    ?TempSrc: Oral     ?SpO2: 99% 98% 98% 98%  ? ? ?Intake/Output Summary (Last 24 hours) at 03/14/2022 1300 ?Last data filed at 03/14/2022 3329 ?Gross per 24 hour  ?Intake 1600.43 ml  ?Output --  ?Net 1600.43 ml  ? ?There were no vitals filed for this visit. ? ?Examination: ? ?General exam: Appears calm and comfortable  ?On room air.  Looks comfortable. ?Respiratory system: Clear to auscultation. Respiratory effort normal. ?Cardiovascular system: S1 & S2 heard, RRR.  ?Gastrointestinal system:  ?Soft.  Nontender. ?Large suprapubic wound with healing margins, she has a central open wound with gauze pack, minimal surrounding induration and thickening, no active drainage.  No surrounding spreading cellulitis. ?Right flank with JP drain, empty bulb. ? ? ? ?Data Reviewed: I have personally reviewed following labs and imaging studies ? ?CBC: ?Recent Labs  ?Lab 03/13/22 ?1345 03/14/22 ?0350  ?WBC 10.8* 8.3  ?NEUTROABS 7.5  --   ?HGB 9.7* 7.7*  ?HCT 31.7* 24.9*  ?MCV 82.3 81.9  ?PLT 354 237  ? ?Basic Metabolic Panel: ?Recent Labs  ?Lab 03/13/22 ?1345 03/14/22 ?0350  ?NA 137 137  ?K 4.2 3.5  ?CL 102 107  ?CO2 24 25  ?GLUCOSE 155* 199*  ?BUN 16 12  ?  CREATININE 0.79 0.70  ?CALCIUM 8.8* 7.7*  ? ?GFR: ?Estimated Creatinine Clearance: 85.6 mL/min (by C-G formula based on SCr of 0.7 mg/dL). ?Liver Function Tests: ?Recent Labs  ?Lab 03/13/22 ?1345 03/14/22 ?0350  ?AST 15 11*  ?ALT 11 9  ?ALKPHOS  70 52  ?BILITOT 0.4 0.4  ?PROT 7.0 5.5*  ?ALBUMIN 2.7* 2.1*  ? ?No results for input(s): LIPASE, AMYLASE in the last 168 hours. ?No results for input(s): AMMONIA in the last 168 hours. ?Coagulation Profile: ?Recent Labs  ?Lab 03/13/22 ?1345  ?INR 1.1  ? ?Cardiac Enzymes: ?No results for input(s): CKTOTAL, CKMB, CKMBINDEX, TROPONINI in the last 168 hours. ?BNP (last 3 results) ?No results for input(s): PROBNP in the last 8760 hours. ?HbA1C: ?No results for input(s): HGBA1C in the last 72 hours. ?CBG: ?Recent Labs  ?Lab 03/13/22 ?2216 03/14/22 ?4097  ?GLUCAP 108* 149*  ? ?Lipid Profile: ?No results for input(s): CHOL, HDL, LDLCALC, TRIG, CHOLHDL, LDLDIRECT in the last 72 hours. ?Thyroid Function Tests: ?No results for input(s): TSH, T4TOTAL, FREET4, T3FREE, THYROIDAB in the last 72 hours. ?Anemia Panel: ?No results for input(s): VITAMINB12, FOLATE, FERRITIN, TIBC, IRON, RETICCTPCT in the last 72 hours. ?Sepsis Labs: ?Recent Labs  ?Lab 03/13/22 ?1345  ?LATICACIDVEN 1.7  ? ? ?Recent Results (from the past 240 hour(s))  ?Culture, blood (Routine x 2)     Status: None (Preliminary result)  ? Collection Time: 03/13/22 12:59 PM  ? Specimen: BLOOD  ?Result Value Ref Range Status  ? Specimen Description BLOOD SITE NOT SPECIFIED  Final  ? Special Requests   Final  ?  BOTTLES DRAWN AEROBIC AND ANAEROBIC Blood Culture results may not be optimal due to an inadequate volume of blood received in culture bottles  ? Culture   Final  ?  NO GROWTH < 24 HOURS ?Performed at [facility], [place] [address] [place], [place] [address] ?  ? Report Status PENDING  Incomplete  ?Culture, blood (Routine x 2)     Status: None (Preliminary result)  ? Collection Time: 03/13/22  1:45 PM  ? Specimen: BLOOD LEFT HAND  ?Result Value Ref Range Status  ? Specimen Description BLOOD LEFT HAND  Final  ? Special Requests   Final  ?  BOTTLES DRAWN AEROBIC AND ANAEROBIC Blood Culture results may not be optimal due to an inadequate volume of blood received  in culture bottles  ? Culture   Final  ?  NO GROWTH < 24 HOURS ?Performed at [facility], [place] [address] [place], [place] [address] ?  ? Report Status PENDING  Incomplete  ?  ? ? ? ? ? ?Radiology Studies: ?DG Chest 2 View ? ?Result Date: 03/13/2022 ?CLINICAL DATA:  Suspected sepsis EXAM: CHEST - 2 VIEW COMPARISON:  02/15/2022 FINDINGS: The heart size and mediastinal contours are within normal limits. Both lungs are clear. No pleural effusion. The visualized skeletal structures are unremarkable. IMPRESSION: No acute process in the chest. Electronically Signed   By: Macy Mis M.D.   On: 03/13/2022 14:14  ? ?CT ABDOMEN PELVIS W CONTRAST ? ?Result Date: 03/13/2022 ?CLINICAL DATA:  Fatigue and sepsis. EXAM: CT ABDOMEN AND PELVIS WITH CONTRAST TECHNIQUE: Multidetector CT imaging of the abdomen and pelvis was performed using the standard protocol following bolus administration of intravenous contrast. RADIATION DOSE REDUCTION: This exam was performed according to the departmental dose-optimization program which includes automated exposure control, adjustment of the mA and/or kV according to patient size and/or use of iterative reconstruction technique. CONTRAST:  153m OMNIPAQUE IOHEXOL 300 MG/ML  SOLN COMPARISON:  March 02, 2022 FINDINGS: Lower chest: No acute abnormality. Hepatobiliary: No focal liver abnormality is seen. A 2.0 cm gallstone is seen within the lumen of a markedly distended gallbladder. No evidence of biliary dilatation. Pancreas: Unremarkable. No pancreatic ductal dilatation or surrounding inflammatory changes. Spleen: Normal in size without focal abnormality. Adrenals/Urinary Tract: Adrenal glands are unremarkable. Kidneys are normal in size, without obstructing renal calculi, focal lesion, or hydronephrosis. Multiple 2 mm and 3 mm nonobstructing renal calculi are seen within the mid and lower right kidney. A 6 mm nonobstructing renal calculus is seen within the mid left kidney. Bladder is  unremarkable. Stomach/Bowel: There is a small hiatal hernia. Appendix appears normal. No evidence of bowel wall thickening, distention, or inflammatory changes. Vascular/Lymphatic: Aortic atherosclerosis. No enl

## 2022-03-15 ENCOUNTER — Other Ambulatory Visit (HOSPITAL_COMMUNITY): Payer: Self-pay

## 2022-03-15 DIAGNOSIS — L089 Local infection of the skin and subcutaneous tissue, unspecified: Secondary | ICD-10-CM | POA: Diagnosis not present

## 2022-03-15 DIAGNOSIS — T148XXA Other injury of unspecified body region, initial encounter: Secondary | ICD-10-CM | POA: Diagnosis not present

## 2022-03-15 LAB — GLUCOSE, CAPILLARY
Glucose-Capillary: 139 mg/dL — ABNORMAL HIGH (ref 70–99)
Glucose-Capillary: 145 mg/dL — ABNORMAL HIGH (ref 70–99)
Glucose-Capillary: 150 mg/dL — ABNORMAL HIGH (ref 70–99)
Glucose-Capillary: 130 mg/dL — ABNORMAL HIGH (ref 70–99)
Glucose-Capillary: 161 mg/dL — ABNORMAL HIGH (ref 70–99)

## 2022-03-15 MED ORDER — CLINDAMYCIN HCL 300 MG PO CAPS
300.0000 mg | ORAL_CAPSULE | Freq: Four times a day (QID) | ORAL | 0 refills | Status: AC
Start: 2022-03-15 — End: 2022-03-22
  Filled 2022-03-15: qty 28, 7d supply, fill #0

## 2022-03-15 NOTE — Progress Notes (Signed)
?PROGRESS NOTE ? ? ? ?[name] [name]  KTG:[id number] DOB: 1951-09-08 DOA: 03/13/2022 ?PCP: Virginia Crews, MD  ? ? ?Brief Narrative:  ?71 year old with history of type 2 diabetes, fibromyalgia, coronary artery disease with prior MI, hypertension, hyperlipidemia ?Panniculectomy 3/6 complicated by wound dehiscence and infection with abscess ?3/15-3/24, admitted with postop complication, JP drain dislodgment and discharged with antibiotics. ?3/31-4/4, admitted with wound dehiscence, purulent drainage, chills and fatigue with abdominal discomfort status post surgical excision of the wound, wound VAC placement and discharged on oral antibiotics. ?Not taking oral antibiotics as she did not have any transportation to pick up medicine. ?Wound VAC fell off, home care nurse sent her to ER. ? ? ? ?Assessment & Plan: ?  ?Postop wound dehiscence with persistent infection: ?Previous wound cultures with Eikenella and recent wound culture with rare MRSA.  Prescribed Augmentin and clindamycin but patient did not take it. ?Does have evidence of wound dehiscence with some surrounding inflammation, less likely will need surgery. ?To be seen by plastic surgery.  Continue vancomycin.  Will transition to clindamycin on discharge. Will need assistance to get medications with her. ?Consult wound care team. ?Wet-to-dry dressing in the hospital, wound VAC to be placed at home. ? ?Type 2 diabetes: Holding metformin, keep on sliding scale insulin. ? ?Fibromyalgia: On Lyrica.  Continue. ? ?Chronic diastolic congestive heart failure/coronary artery disease/hypertension/hyperlipidemia: ?Stable on carvedilol, Zetia. ? ?Called and discussed with Dr. Marla Roe, will await surgical plan before discharging. ? ? ?DVT prophylaxis: enoxaparin (LOVENOX) injection 40 mg Start: 03/14/22 1000 ? ? ?Code Status: Full code ?Family Communication: None ?Disposition Plan: Status is: Inpatient ?Remains inpatient appropriate because: Wound infection, IV  antibiotics ?  ? ? ?Consultants:  ?Plastic surgery, notified ? ?Procedures:  ?None ? ?Antimicrobials:  ?Vancomycin and cefepime 4/11--- ? ? ?Subjective: ? ?Patient seen and examined.  Mild discomfort along the incision line.  Continues to drain from the incision.  Remains afebrile.  Pain is controlled. ? ?Objective: ?Vitals:  ? 03/15/22 0308 03/15/22 0754 03/15/22 0900 03/15/22 1141  ?BP: 122/73 (!) 131/48  (!) 108/56  ?Pulse: 72 70  62  ?Resp: '16 17  16  '$ ?Temp: 97.9 ?F (36.6 ?C) 97.6 ?F (36.4 ?C)  97.6 ?F (36.4 ?C)  ?TempSrc: Oral Oral  Oral  ?SpO2: 96% 97% 99% 97%  ?Weight:      ? ? ?Intake/Output Summary (Last 24 hours) at 03/15/2022 1142 ?Last data filed at 03/14/2022 4287 ?Gross per 24 hour  ?Intake 300 ml  ?Output --  ?Net 300 ml  ? ?Filed Weights  ? 03/15/22 0143  ?Weight: 107.9 kg  ? ? ?Examination: ? ?General exam: Appears calm and comfortable  ?On room air.  Looks comfortable. ?Respiratory system: Clear to auscultation. Respiratory effort normal. ?Cardiovascular system: S1 & S2 heard, RRR.  ?Gastrointestinal system:  ?Soft.  Nontender. ?Large suprapubic wound with healing margins,  ?Central area of open wound with purulent drainage packed with dressing. ?Induration with some tenderness around the central opening. ?Right flank JP bulb empty. ? ? ?Data Reviewed: I have personally reviewed following labs and imaging studies ? ?CBC: ?Recent Labs  ?Lab 03/13/22 ?1345 03/14/22 ?0350  ?WBC 10.8* 8.3  ?NEUTROABS 7.5  --   ?HGB 9.7* 7.7*  ?HCT 31.7* 24.9*  ?MCV 82.3 81.9  ?PLT 354 237  ? ?Basic Metabolic Panel: ?Recent Labs  ?Lab 03/13/22 ?1345 03/14/22 ?0350  ?NA 137 137  ?K 4.2 3.5  ?CL 102 107  ?CO2 24 25  ?GLUCOSE 155* 199*  ?BUN  16 12  ?CREATININE 0.79 0.70  ?CALCIUM 8.8* 7.7*  ? ?GFR: ?Estimated Creatinine Clearance: 85.1 mL/min (by C-G formula based on SCr of 0.7 mg/dL). ?Liver Function Tests: ?Recent Labs  ?Lab 03/13/22 ?1345 03/14/22 ?0350  ?AST 15 11*  ?ALT 11 9  ?ALKPHOS 70 52  ?BILITOT 0.4 0.4  ?PROT 7.0  5.5*  ?ALBUMIN 2.7* 2.1*  ? ?No results for input(s): LIPASE, AMYLASE in the last 168 hours. ?No results for input(s): AMMONIA in the last 168 hours. ?Coagulation Profile: ?Recent Labs  ?Lab 03/13/22 ?1345  ?INR 1.1  ? ?Cardiac Enzymes: ?No results for input(s): CKTOTAL, CKMB, CKMBINDEX, TROPONINI in the last 168 hours. ?BNP (last 3 results) ?No results for input(s): PROBNP in the last 8760 hours. ?HbA1C: ?No results for input(s): HGBA1C in the last 72 hours. ?CBG: ?Recent Labs  ?Lab 03/14/22 ?1722 03/14/22 ?1938 03/14/22 ?2311 03/15/22 ?0307 03/15/22 ?0757  ?GLUCAP 120* 187* 185* 139* 145*  ? ?Lipid Profile: ?No results for input(s): CHOL, HDL, LDLCALC, TRIG, CHOLHDL, LDLDIRECT in the last 72 hours. ?Thyroid Function Tests: ?No results for input(s): TSH, T4TOTAL, FREET4, T3FREE, THYROIDAB in the last 72 hours. ?Anemia Panel: ?No results for input(s): VITAMINB12, FOLATE, FERRITIN, TIBC, IRON, RETICCTPCT in the last 72 hours. ?Sepsis Labs: ?Recent Labs  ?Lab 03/13/22 ?1345  ?LATICACIDVEN 1.7  ? ? ?Recent Results (from the past 240 hour(s))  ?Culture, blood (Routine x 2)     Status: None (Preliminary result)  ? Collection Time: 03/13/22 12:59 PM  ? Specimen: BLOOD  ?Result Value Ref Range Status  ? Specimen Description BLOOD SITE NOT SPECIFIED  Final  ? Special Requests   Final  ?  BOTTLES DRAWN AEROBIC AND ANAEROBIC Blood Culture results may not be optimal due to an inadequate volume of blood received in culture bottles  ? Culture   Final  ?  NO GROWTH 2 DAYS ?Performed at [facility], [place] [address] [place], [place] [address] ?  ? Report Status PENDING  Incomplete  ?Culture, blood (Routine x 2)     Status: None (Preliminary result)  ? Collection Time: 03/13/22  1:45 PM  ? Specimen: BLOOD LEFT HAND  ?Result Value Ref Range Status  ? Specimen Description BLOOD LEFT HAND  Final  ? Special Requests   Final  ?  BOTTLES DRAWN AEROBIC AND ANAEROBIC Blood Culture results may not be optimal due to an inadequate  volume of blood received in culture bottles  ? Culture   Final  ?  NO GROWTH 2 DAYS ?Performed at [facility], [place] [address] [place], [place] [address] ?  ? Report Status PENDING  Incomplete  ?  ? ? ? ? ? ?Radiology Studies: ?DG Chest 2 View ? ?Result Date: 03/13/2022 ?CLINICAL DATA:  Suspected sepsis EXAM: CHEST - 2 VIEW COMPARISON:  02/15/2022 FINDINGS: The heart size and mediastinal contours are within normal limits. Both lungs are clear. No pleural effusion. The visualized skeletal structures are unremarkable. IMPRESSION: No acute process in the chest. Electronically Signed   By: Macy Mis M.D.   On: 03/13/2022 14:14  ? ?CT ABDOMEN PELVIS W CONTRAST ? ?Result Date: 03/13/2022 ?CLINICAL DATA:  Fatigue and sepsis. EXAM: CT ABDOMEN AND PELVIS WITH CONTRAST TECHNIQUE: Multidetector CT imaging of the abdomen and pelvis was performed using the standard protocol following bolus administration of intravenous contrast. RADIATION DOSE REDUCTION: This exam was performed according to the departmental dose-optimization program which includes automated exposure control, adjustment of the mA and/or kV according  to patient size and/or use of iterative reconstruction technique. CONTRAST:  147m OMNIPAQUE IOHEXOL 300 MG/ML  SOLN COMPARISON:  March 02, 2022 FINDINGS: Lower chest: No acute abnormality. Hepatobiliary: No focal liver abnormality is seen. A 2.0 cm gallstone is seen within the lumen of a markedly distended gallbladder. No evidence of biliary dilatation. Pancreas: Unremarkable. No pancreatic ductal dilatation or surrounding inflammatory changes. Spleen: Normal in size without focal abnormality. Adrenals/Urinary Tract: Adrenal glands are unremarkable. Kidneys are normal in size, without obstructing renal calculi, focal lesion, or hydronephrosis. Multiple 2 mm and 3 mm nonobstructing renal calculi are seen within the mid and lower right kidney. A 6 mm nonobstructing renal calculus is seen within the mid left  kidney. Bladder is unremarkable. Stomach/Bowel: There is a small hiatal hernia. Appendix appears normal. No evidence of bowel wall thickening, distention, or inflammatory changes. Vascular/Lymphatic: Aortic atheros

## 2022-03-15 NOTE — TOC Progression Note (Signed)
Transition of Care (TOC) - Progression Note  ? ? ?Patient Details  ?Name: [name] [name] ?MRN: [id number] ?Date of Birth: 1951-10-25 ? ?Transition of Care (TOC) CM/SW Contact  ?Ninfa Meeker, RN ?Phone Number: ?03/15/2022, 4:25 PM ? ?Clinical Narrative:   Patient is active with [facility] for [facility], Case manager notified [facility] that patient will discharge on 4/14. CM messaged MD for resumption of care Home Health Orders. TOC Team will continue to monitor.  ? ?  ?  ? ?Expected Discharge Plan and Services ?  ?  ?  ?  ?  ?                ?  ?  ?  ?  ?  ?  ?  ?  ?  ?  ? ? ?Social Determinants of Health (SDOH) Interventions ?  ? ?Readmission Risk Interventions ?   ? View : No data to display.  ?  ?  ?  ? ? ?

## 2022-03-15 NOTE — Consult Note (Signed)
[facility] ? ?Reason for Consult: Abdominal wound status post panniculectomy ?Referring Physician: Dr Sloan Leiter, MD ? ?[name] [name] is an 71 y.o. female.  ?HPI: 71 year old female with history of type 2 diabetes, fibromyalgia, coronary artery disease, hypertension, hyperlipidemia.  She underwent panniculectomy on 3/6 with Dr. Marla Roe.  Postoperatively she developed wound dehiscence and subsequent infection.  She was hospitalized 3/15-3/24 and was discharged after debridement of abdominal wound and wound VAC placement.  She was discharged on oral antibiotics.  She did not start the oral antibiotics due to transportation issues.  She reports after discharge she did not see her home health nurse until 03/13/2022 and she was instructed to go to the ED for drainage from the wound. ? ?Patient reports she is doing well today.  She does not have any pain in her abdomen.  She reports no fevers.  She reports her family is currently out of town for [place] trip. ? ?She reports her drain fell out over the last few days. ? ?Past Medical History:  ?Diagnosis Date  ? Anxiety   ? Arthritis   ? Rhumetoid arthritis  ? BRCA negative 07/2017  ? MyRisk neg  ? Cancer [facility])   ? uterine  ? Cellulitis of both lower extremities   ? Chronic  ? CHF (congestive heart failure) ([place])   ? Coronary artery disease   ? Depression   ? Diabetes mellitus without complication ([place])   ? Family history of breast cancer 07/2017  ? MyRisk neg; IBIS=10.5%/riskscore=17%  ? Family history of ovarian cancer   ? Fibromyalgia   ? Fibromyalgia affecting shoulder region   ? Headache   ? Hyperlipidemia   ? Hypertension   ? Spinal stenosis   ? ? ?Past Surgical History:  ?Procedure Laterality Date  ? ABLATION SAPHENOUS VEIN W/ RFA    ? ANAL FISSURECTOMY  01/2004  ? COLON SURGERY    ? CORONARY ANGIOPLASTY    ? CYSTOSCOPY  09/03/2017  ? Procedure: CYSTOSCOPY;  Surgeon: Gae Dry, MD;  Location: [facility];  Service: Gynecology;;  ? DILATION  AND CURETTAGE OF UTERUS    ? ESOPHAGEAL DILATION  02/20/2022  ? Procedure: ESOPHAGEAL DILATION;  Surgeon: Carol Ada, MD;  Location: [place];  Service: Gastroenterology;;  savory  ? ESOPHAGOGASTRODUODENOSCOPY (EGD) WITH PROPOFOL N/A 02/20/2022  ? Procedure: ESOPHAGOGASTRODUODENOSCOPY (EGD) WITH PROPOFOL;  Surgeon: Carol Ada, MD;  Location: [place];  Service: Gastroenterology;  Laterality: N/A;  Dyspahgia  ? EYE SURGERY Bilateral 08/2020  ? eye lift  ? HERNIA REPAIR    ? INCISION AND DRAINAGE OF WOUND  03/04/2022  ? Procedure: IRRIGATION AND DEBRIDEMENT ABDOMEN WITH VAC PLACEMENT, MYRIAD PLACEMENT;  Surgeon: Wallace Going, DO;  Location: [place];  Service: Plastics;;  ? LAPAROSCOPIC HYSTERECTOMY N/A 09/03/2017  ? Procedure: HYSTERECTOMY TOTAL LAPAROSCOPIC BSO;  Surgeon: Gae Dry, MD;  Location: [facility];  Service: Gynecology;  Laterality: N/A;  ? MEDIAL PARTIAL KNEE REPLACEMENT Left 01/06/2014  ? PANNICULECTOMY N/A 02/05/2022  ? Procedure: PANNICULECTOMY;  Surgeon: Wallace Going, DO;  Location: [place];  Service: Plastics;  Laterality: N/A;  3 hours  ? UMBILICAL HERNIA REPAIR N/A 09/05/2015  ? Procedure: HERNIA REPAIR incarcerated UMBILICAL ADULT;  Surgeon: Sherri Rad, MD;  Location: [facility];  Service: General;  Laterality: N/A;  ? Vascular Stent  11/04/2013  ? ? ?Family History  ?Problem Relation Age of Onset  ? Hypertension Mother   ? Cataracts Mother   ? Thyroid disease Mother   ?  Dementia Mother   ? COPD Father   ? Dementia Father   ? Cataracts Father   ? Aneurysm Father   ?     Abdominal & Brain  ? Diabetes Father   ? Melanoma Father   ? Breast cancer Sister 17  ? Fibromyalgia Sister   ? Migraines Sister   ? Hypertension Sister   ? Breast cancer Sister 44  ? COPD Sister   ? Ovarian cancer Sister 37  ? Migraines Brother   ? Heart attack Brother   ? Colon cancer Paternal Uncle   ?     48s  ? Colon cancer Paternal Uncle   ?     69s  ? Uterine cancer Cousin   ? Uterine cancer Cousin    ? ? ?Social History:  reports that she quit smoking about 25 years ago. Her smoking use included cigarettes. She has a 29.00 pack-year smoking history. She has never used smokeless tobacco. She reports that she does not drink alcohol and does not use drugs. ? ?Allergies:  ?Allergies  ?Allergen Reactions  ? Sulfa Antibiotics Itching, Rash and Swelling  ? Latex Itching  ? Doxycycline Rash  ? Empagliflozin Itching and Other (See Comments)  ?  Itching and frequent yeast infections  ? ? ?Medications: I have reviewed the patient's current medications. ? ?Results for orders placed or performed during the hospital encounter of 03/13/22 (from the past 48 hour(s))  ?CBG monitoring, ED     Status: Abnormal  ? Collection Time: 03/13/22 10:16 PM  ?Result Value Ref Range  ? Glucose-Capillary 108 (H) 70 - 99 mg/dL  ?  Comment: Glucose reference range applies only to samples taken after fasting for at least 8 hours.  ?Urinalysis, Routine w reflex microscopic Urine, Clean Catch     Status: Abnormal  ? Collection Time: 03/13/22 11:13 PM  ?Result Value Ref Range  ? Color, Urine STRAW (A) YELLOW  ? APPearance CLEAR CLEAR  ? Specific Gravity, Urine 1.033 (H) 1.005 - 1.030  ? pH 6.0 5.0 - 8.0  ? Glucose, UA NEGATIVE NEGATIVE mg/dL  ? Hgb urine dipstick SMALL (A) NEGATIVE  ? Bilirubin Urine NEGATIVE NEGATIVE  ? Ketones, ur NEGATIVE NEGATIVE mg/dL  ? Protein, ur NEGATIVE NEGATIVE mg/dL  ? Nitrite NEGATIVE NEGATIVE  ? Leukocytes,Ua NEGATIVE NEGATIVE  ? RBC / HPF 6-10 0 - 5 RBC/hpf  ? WBC, UA 6-10 0 - 5 WBC/hpf  ? Bacteria, UA NONE SEEN NONE SEEN  ? Squamous Epithelial / LPF 0-5 0 - 5  ?  Comment: Performed at [facility], [place] [address], [place], [place] [address]  ?Comprehensive metabolic panel     Status: Abnormal  ? Collection Time: 03/14/22  3:50 AM  ?Result Value Ref Range  ? Sodium 137 135 - 145 mmol/L  ? Potassium 3.5 3.5 - 5.1 mmol/L  ? Chloride 107 98 - 111 mmol/L  ? CO2 25 22 - 32 mmol/L  ? Glucose, Bld 199 (H) 70 - 99  mg/dL  ?  Comment: Glucose reference range applies only to samples taken after fasting for at least 8 hours.  ? BUN 12 8 - 23 mg/dL  ? Creatinine, Ser 0.70 0.44 - 1.00 mg/dL  ? Calcium 7.7 (L) 8.9 - 10.3 mg/dL  ? Total Protein 5.5 (L) 6.5 - 8.1 g/dL  ? Albumin 2.1 (L) 3.5 - 5.0 g/dL  ? AST 11 (L) 15 - 41 U/L  ? ALT 9 0 - 44 U/L  ?  Alkaline Phosphatase 52 38 - 126 U/L  ? Total Bilirubin 0.4 0.3 - 1.2 mg/dL  ? GFR, Estimated >60 >60 mL/min  ?  Comment: (NOTE) ?Calculated using the CKD-EPI Creatinine Equation (2021) ?  ? Anion gap 5 5 - 15  ?  Comment: Performed at [facility], [place] [address] [place], [place] [address]  ?CBC     Status: Abnormal  ? Collection Time: 03/14/22  3:50 AM  ?Result Value Ref Range  ? WBC 8.3 4.0 - 10.5 K/uL  ? RBC 3.04 (L) 3.87 - 5.11 MIL/uL  ? Hemoglobin 7.7 (L) 12.0 - 15.0 g/dL  ? HCT 24.9 (L) 36.0 - 46.0 %  ? MCV 81.9 80.0 - 100.0 fL  ? MCH 25.3 (L) 26.0 - 34.0 pg  ? MCHC 30.9 30.0 - 36.0 g/dL  ? RDW 15.4 11.5 - 15.5 %  ? Platelets 237 150 - 400 K/uL  ? nRBC 0.0 0.0 - 0.2 %  ?  Comment: Performed at [facility], [place] [address], [place],  [address]  ?CBG monitoring, ED     Status: Abnormal  ? Collection Time: 03/14/22  6:16 AM  ?Result Value Ref Range  ? Glucose-Capillary 149 (H) 70 - 99 mg/dL  ?  Comment: Glucose reference range applies only to samples taken after fasting for at least 8 hours.  ?CBG monitoring, ED     Status: Abnormal  ? Collection Time: 03/14/22  1:29 PM  ?Result Value Ref Range  ? Glucose-Capillary 150 (H) 70 - 99 mg/dL  ?  Comment: Glucose reference range applies only to samples taken after fasting for at least 8 hours.  ?Glucose, capillary     Status: Abnormal  ? Collection Time: 03/14/22  5:22 PM  ?Result Value Ref Range  ? Glucose-Capillary 120 (H) 70 - 99 mg/dL  ?  Comment: Glucose reference range applies only to samples taken after fasting for at least 8 hours.  ?Glucose, capillary     Status: Abnormal  ? Collection Time: 03/14/22  7:38 PM   ?Result Value Ref Range  ? Glucose-Capillary 187 (H) 70 - 99 mg/dL  ?  Comment: Glucose reference range applies only to samples taken after fasting for at least 8 hours.  ?Glucose, capillary     Status: Abnorma

## 2022-03-15 NOTE — TOC Initial Note (Signed)
Transition of Care (TOC) - Initial/Assessment Note  ? ? ?Patient Details  ?Name: [name] [name] ?MRN: [id number] ?Date of Birth: [birth year] ? ?Transition of Care [facility]) CM/SW Contact:    ?Ninfa Meeker, RN ?Phone Number: ?03/15/2022, 10:20 AM ? ?Clinical Narrative:        ? ?Transition of Care Screening Note: ? ?Transition of Care [facility]) Department has reviewed patient and no TOC needs have been identified at this time. We will continue to monitor patient advancement through Interdisciplinary progressions and if new patient needs arise, please place a consult.   ? ?          ? ? ?  ?  ? ? ?Patient Goals and CMS Choice ?  ?  ?  ? ?Expected Discharge Plan and Services ?  ?  ?  ?  ?  ?                ?  ?  ?  ?  ?  ?  ?  ?  ?  ?  ? ?Prior Living Arrangements/Services ?  ?  ?  ?       ?  ?  ?  ?  ? ?Activities of Daily Living ?  ?  ? ?Permission Sought/Granted ?  ?  ?   ?   ?   ?   ? ?Emotional Assessment ?  ?  ?  ?  ?  ?  ? ?Admission diagnosis:  Wound infection [T14.8XXA, L08.9] ?Postoperative infection, unspecified type, initial encounter [T81.40XA] ?Patient Active Problem List  ? Diagnosis Date Noted  ? Wound infection 03/13/2022  ? Normocytic anemia 03/03/2022  ? Abdominal wall cellulitis 03/03/2022  ? Wound dehiscence 03/03/2022  ? 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 Hyponatremia 02/15/2022  ? Hypoalbuminemia 02/15/2022  ? Post op infection 02/15/2022  ?  Class: Question of  ? Anemia associated with acute blood loss 02/15/2022  ? Hypokalemia 02/15/2022  ? Hypotension 02/15/2022  ? Right arm pain 10/19/2021  ? Housing instability 03/28/2021  ? Dermatochalasis of both upper eyelids 03/23/2021  ? 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 Ptosis of both eyebrows 03/23/2021  ? Ptosis of both upper eyelids 03/23/2021  ? Senile ectropion of both lower eyelids 03/23/2021  ? Senile purpura ([place]) 03/07/2021  ? Acute anxiety 03/07/2021  ? Panniculitis 02/03/2021  ? Myalgia due to statin 12/26/2020  ? Telogen effluvium  12/26/2020  ? Candidal intertrigo 12/06/2020  ? Microcytic anemia 12/06/2020  ? Polyneuropathy associated with underlying disease ([place]) 02/03/2020  ? Lump in chest 12/28/2019  ? Grief 10/20/2019  ? Mixed stress and urge urinary incontinence 08/21/2019  ? Avitaminosis D 07/15/2019  ? Drug-induced constipation 06/12/2019  ? General weakness 07/09/2018  ? Lumbar spondylosis 07/08/2018  ? Chronic bilateral low back pain with bilateral sciatica 07/08/2018  ? Chronic pain syndrome 07/08/2018  ? Urinary incontinence 03/19/2018  ? 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 Atypical endometrial hyperplasia 08/15/2017  ? Lymphedema 01/31/2017  ? Hyperlipidemia associated with type 2 diabetes mellitus ([place]) 12/14/2016  ? Hypertension associated with diabetes ([place]) 12/14/2016  ? Chronic systolic CHF (congestive heart failure), NYHA class 3 ([place]) 10/06/2015  ? History of MI (myocardial infarction) 09/20/2015  ? Allergic rhinitis 07/06/2015  ? Chronic venous insufficiency 07/06/2015  ? Atherosclerosis of coronary artery 07/06/2015  ? MDD (major depressive disorder) 07/06/2015  ? Fibromyalgia 07/06/2015  ? Encounter for long-term (current) use of other medications 07/26/2014  ? Encounter for long-term (current) use of high-risk  medication 07/26/2014  ? Type 2 diabetes mellitus with complication, without long-term current use of insulin ([place]) 04/08/2014  ? Migraines 04/08/2014  ? Gastroduodenal ulcer 04/08/2014  ? Venous stasis 04/08/2014  ? Osteoarthritis 04/08/2014  ? Coronary artery disease 04/08/2014  ? Peptic ulcer disease 04/08/2014  ? Obesity 04/08/2014  ? Seropositive rheumatoid arthritis ([place]) 04/06/2014  ? ?PCP:  Virginia Crews, MD ?Pharmacy:   ?[facility] [place] [address] ?[place] [address] ?Phone: [phone] Fax: [phone] ? ?[facility] ?[address] ?[place] [address] ?Phone: [phone]  Fax: [phone] ? ? ? ? ?Social Determinants of Health (SDOH) Interventions ?  ? ?Readmission Risk Interventions ?   ? View : No data to display.  ?  ?  ?  ? ? ? ?

## 2022-03-16 ENCOUNTER — Other Ambulatory Visit (HOSPITAL_COMMUNITY): Payer: Self-pay

## 2022-03-16 LAB — GLUCOSE, CAPILLARY: Glucose-Capillary: 140 mg/dL — ABNORMAL HIGH (ref 70–99)

## 2022-03-16 NOTE — Care Management Important Message (Signed)
Important Message ? ?Patient Details  ?Name: [name] [name] ?MRN: [id number] ?Date of Birth: November 25, 1951 ? ? ?Medicare Important Message Given:  Yes ? ?Patient left prior to IM delivery will mail Im to the patient home address.  ? ? ?Vangie Henthorn ?03/16/2022, 1:45 PM ?

## 2022-03-16 NOTE — Discharge Summary (Signed)
Physician Discharge Summary  ?CHARLEE SQUIBB KDT:[id number] DOB: [birth year] DOA: 03/13/2022 ? ?PCP: Virginia Crews, MD ? ?Admit date: 03/13/2022 ?Discharge date: 03/16/2022 ? ?Admitted From: Home ?Disposition: Home with home health ? ?Recommendations for Outpatient Follow-up:  ?Follow-up with surgery as scheduled. ? ?Home Health: Home health RN, wound care ?Equipment/Devices: Wound VAC ? ?Discharge Condition: Stable ?CODE STATUS: Full code ?Diet recommendation: Low-salt diet ? ?Discharge summary: ? ?71 year old with history of type 2 diabetes, fibromyalgia, coronary artery disease with prior MI, hypertension, hyperlipidemia ?Panniculectomy 3/6 complicated by wound dehiscence and infection with abscess ?3/15-3/24, admitted with postop complication, JP drain dislodgment and discharged with antibiotics. ?3/31-4/4, admitted with wound dehiscence, purulent drainage, chills and fatigue with abdominal discomfort status post surgical excision of the wound, wound VAC placement and discharged on oral antibiotics. ?Not taking oral antibiotics as she did not have any transportation to pick up medicine. ?Wound VAC fell off, home care nurse sent her to ER. ? ?# Postop wound dehiscence with persistent infection/ drainage and open draining wound: ?Previous wound cultures with Eikenella and recent wound culture with rare MRSA.  Prescribed Augmentin and clindamycin but patient did not take it. ?seen by plastic surgery.  Recommended oral antibiotics, continue wet-to-dry dressing and ultimately changing to wound VAC at home.  We will continue clindamycin for 1 more week.  Since she had difficulty going to pharmacy, antibiotics were arranged from [facility] and brought to her.  ?Going home with wet-to-dry dressing, wound care home health RN will give her wound VAC.  ?Plastic surgery will schedule follow-up.  ? ? ?Other chronic medical issues remained stable.  Continue all medical management.   ? ?Patient is stable for discharge with  home health plans. ? ?Discharge Diagnoses:  ?Principal Problem: ?  Wound infection ? ? ? ?Discharge Instructions ? ?Discharge Instructions   ? ? Call MD for:  redness, tenderness, or signs of infection (pain, swelling, redness, odor or green/yellow discharge around incision site)   Complete by: As directed ?  ? Call MD for:  severe uncontrolled pain   Complete by: As directed ?  ? Call MD for:  temperature >100.4   Complete by: As directed ?  ? Diet - low sodium heart healthy   Complete by: As directed ?  ? Diet Carb Modified   Complete by: As directed ?  ? Discharge wound care:   Complete by: As directed ?  ? Wound vac at home. Wet to dry dressing until wound vac placement  ? Increase activity slowly   Complete by: As directed ?  ? ?  ? ?Allergies as of 03/16/2022   ? ?   Reactions  ? Sulfa Antibiotics Itching, Rash, Swelling  ? Latex Itching  ? Doxycycline Rash  ? Empagliflozin Itching, Other (See Comments)  ? Itching and frequent yeast infections  ? ?  ? ?  ?Medication List  ?  ? ?TAKE these medications   ? ?acetaminophen 500 MG tablet ?Commonly known as: TYLENOL ?Take 1,000 mg by mouth every 6 (six) hours as needed for mild pain. ?  ?buPROPion 300 MG 24 hr tablet ?Commonly known as: WELLBUTRIN XL ?TAKE 1 TABLET BY MOUTH EVERY DAY ?  ?carvedilol 6.25 MG tablet ?Commonly known as: COREG ?Take 1 tablet (6.25 mg total) by mouth 2 (two) times daily with a meal. ?  ?clindamycin 300 MG capsule ?Commonly known as: CLEOCIN ?Take 1 capsule (300 mg total) by mouth 4 (four) times daily for 7 days. ?  ?cyanocobalamin 1000  MCG tablet ?Take 1 tablet (1,000 mcg total) by mouth daily. ?  ?DULoxetine 60 MG capsule ?Commonly known as: CYMBALTA ?Take 1 capsule (60 mg total) by mouth daily. ?  ?ezetimibe 10 MG tablet ?Commonly known as: ZETIA ?TAKE 1 TABLET BY MOUTH EVERY DAY ?  ?HAIR/SKIN/NAILS PO ?Take 1 tablet by mouth daily. ?  ?metFORMIN 500 MG tablet ?Commonly known as: GLUCOPHAGE ?TAKE 1 TABLET BY MOUTH 2 TIMES DAILY WITH A  MEAL. ?  ?oxyCODONE 5 MG immediate release tablet ?Commonly known as: Oxy IR/ROXICODONE ?Take 1 tablet (5 mg total) by mouth every 6 (six) hours as needed for severe pain. ?  ?polyethylene glycol 17 g packet ?Commonly known as: MIRALAX / GLYCOLAX ?Take 17 g by mouth daily as needed for mild constipation. ?  ?pregabalin 75 MG capsule ?Commonly known as: LYRICA ?Take 1 capsule (75 mg total) by mouth 3 (three) times daily. ?  ?saccharomyces boulardii 250 MG capsule ?Commonly known as: FLORASTOR ?Take 1 capsule (250 mg total) by mouth 2 (two) times daily for 14 days. ?  ?senna-docusate 8.6-50 MG tablet ?Commonly known as: Senokot-S ?Take 2 tablets by mouth 2 (two) times daily. ?  ?tolterodine 4 MG 24 hr capsule ?Commonly known as: DETROL LA ?Take 4 mg by mouth daily. ?  ? ?  ? ?  ?  ? ? ?  ?Discharge Care Instructions  ?(From admission, onward)  ?  ? ? ?  ? ?  Start     Ordered  ? 03/16/22 0000  Discharge wound care:       ?Comments: Wound vac at home. Wet to dry dressing until wound vac placement  ? 03/16/22 0713  ? ?  ?  ? ?  ? ? Follow-up Information   ? ? [place] Follow up.   ?Specialty: Home Health Services ?Why: A reprsentative from [facility] will contact you to resume your services ?Contact information: ?[address] ?[place] [address] ?[phone] ? ? ?  ?  ? ?  ?  ? ?  ? ?Allergies  ?Allergen Reactions  ? Sulfa Antibiotics Itching, Rash and Swelling  ? Latex Itching  ? Doxycycline Rash  ? Empagliflozin Itching and Other (See Comments)  ?  Itching and frequent yeast infections  ? ? ?Consultations: ?Plastic surgery ? ? ?Procedures/Studies: ?CT ABDOMEN PELVIS WO CONTRAST ? ?Result Date: 02/16/2022 ?CLINICAL DATA:  Abdominal pain. Status post recent abdominal surgery. EXAM: CT ABDOMEN AND PELVIS WITHOUT CONTRAST TECHNIQUE: Multidetector CT imaging of the abdomen and pelvis was performed following the standard protocol without IV contrast. RADIATION DOSE REDUCTION: This  exam was performed according to the departmental dose-optimization program which includes automated exposure control, adjustment of the mA and/or kV according to patient size and/or use of iterative reconstruction technique. COMPARISON:  CT abdomen pelvis dated 02/15/2022. FINDINGS: Evaluation of this exam is limited in the absence of intravenous contrast. Evaluation is also limited due to streak artifact caused by patient's arms. Lower chest: Trace left pleural effusion. The visualized lung bases are otherwise clear. No intra-abdominal free air or free fluid. Hepatobiliary: The liver is unremarkable. No intrahepatic biliary dilatation. There is a stone within the gallbladder. No pericholecystic fluid or evidence of acute cholecystitis by CT. Pancreas: Unremarkable. No pancreatic ductal dilatation or surrounding inflammatory changes. Spleen: Normal in size without focal abnormality. Adrenals/Urinary Tract: The adrenal glands unremarkable. Nonobstructing bilateral renal calculi measure up to 7 mm in the upper pole of the left kidney. There  is no hydronephrosis on either side. The visualized ureters and urinary bladder appear unremarkable. Stomach/Bowel: Mild circumferential thickening of the distal esophagus may represent mild esophagitis or sequela reflux. Clinical correlation recommended. There is no bowel obstruction. The appendix is normal. Vascular/Lymphatic: Mild aortoiliac atherosclerotic disease. The IVC is unremarkable. No portal venous gas. There is no adenopathy. Reproductive: Hysterectomy.  No adnexal masses. Other: Anterior lower abdomen and pelvic wall subcutaneous air as seen on the prior CT. This may be postsurgical or introduced via the drainage catheter. The tip of the drainage catheter is in the anterior abdominal wall in the midline. No significant fluid collection. There is stranding of the subcutaneous soft tissues of the anterior abdominal wall. Musculoskeletal: Osteopenia with degenerative  changes of the spine. No acute osseous pathology. IMPRESSION: 1. No significant interval change since the prior CT. Air in the soft tissues of the anterior pelvic wall as seen on the prior CT. No drainable fl

## 2022-03-16 NOTE — TOC Transition Note (Addendum)
Transition of Care (TOC) - CM/SW Discharge Note ? ? ?Patient Details  ?Name: [name] [name] ?MRN: [id number] ?Date of Birth: 01/21/1951 ? ?Transition of Care (TOC) CM/SW Contact:  ?Zenon Mayo, RN ?Phone Number: ?03/16/2022, 9:56 AM ? ? ?Clinical Narrative:    ?Patient is for dc today, [name] with [facility] states she has all the orders she needs for the [facility].  HH will go out to see her to do wound care.  Patient states her sister will transport her home. ? ? ?  ?  ? ? ?Patient Goals and CMS Choice ?  ?  ?  ? ?Discharge Placement ?  ?           ?  ?  ?  ?  ? ?Discharge Plan and Services ?  ?  ?           ?  ?  ?  ?  ?  ?  ?  ?  ?  ?  ? ?Social Determinants of Health (SDOH) Interventions ?  ? ? ?Readmission Risk Interventions ?   ? View : No data to display.  ?  ?  ?  ? ? ? ? ? ?

## 2022-03-18 LAB — CULTURE, BLOOD (ROUTINE X 2)
Culture: NO GROWTH
Culture: NO GROWTH

## 2022-03-19 DIAGNOSIS — I2510 Atherosclerotic heart disease of native coronary artery without angina pectoris: Secondary | ICD-10-CM | POA: Diagnosis not present

## 2022-03-19 DIAGNOSIS — I110 Hypertensive heart disease with heart failure: Secondary | ICD-10-CM | POA: Diagnosis not present

## 2022-03-19 DIAGNOSIS — Z87891 Personal history of nicotine dependence: Secondary | ICD-10-CM | POA: Diagnosis not present

## 2022-03-19 DIAGNOSIS — E785 Hyperlipidemia, unspecified: Secondary | ICD-10-CM | POA: Diagnosis not present

## 2022-03-19 DIAGNOSIS — M48061 Spinal stenosis, lumbar region without neurogenic claudication: Secondary | ICD-10-CM | POA: Diagnosis not present

## 2022-03-19 DIAGNOSIS — F32A Depression, unspecified: Secondary | ICD-10-CM | POA: Diagnosis not present

## 2022-03-19 DIAGNOSIS — M797 Fibromyalgia: Secondary | ICD-10-CM | POA: Diagnosis not present

## 2022-03-19 DIAGNOSIS — I509 Heart failure, unspecified: Secondary | ICD-10-CM | POA: Diagnosis not present

## 2022-03-19 DIAGNOSIS — E119 Type 2 diabetes mellitus without complications: Secondary | ICD-10-CM | POA: Diagnosis not present

## 2022-03-19 DIAGNOSIS — Z96652 Presence of left artificial knee joint: Secondary | ICD-10-CM | POA: Diagnosis not present

## 2022-03-19 DIAGNOSIS — E871 Hypo-osmolality and hyponatremia: Secondary | ICD-10-CM | POA: Diagnosis not present

## 2022-03-19 DIAGNOSIS — T8149XD Infection following a procedure, other surgical site, subsequent encounter: Secondary | ICD-10-CM | POA: Diagnosis not present

## 2022-03-19 DIAGNOSIS — Z9181 History of falling: Secondary | ICD-10-CM | POA: Diagnosis not present

## 2022-03-19 DIAGNOSIS — M1990 Unspecified osteoarthritis, unspecified site: Secondary | ICD-10-CM | POA: Diagnosis not present

## 2022-03-19 DIAGNOSIS — M545 Low back pain, unspecified: Secondary | ICD-10-CM | POA: Diagnosis not present

## 2022-03-19 DIAGNOSIS — Z7984 Long term (current) use of oral hypoglycemic drugs: Secondary | ICD-10-CM | POA: Diagnosis not present

## 2022-03-19 DIAGNOSIS — M069 Rheumatoid arthritis, unspecified: Secondary | ICD-10-CM | POA: Diagnosis not present

## 2022-03-19 DIAGNOSIS — G8929 Other chronic pain: Secondary | ICD-10-CM | POA: Diagnosis not present

## 2022-03-21 DIAGNOSIS — M545 Low back pain, unspecified: Secondary | ICD-10-CM | POA: Diagnosis not present

## 2022-03-21 DIAGNOSIS — M1990 Unspecified osteoarthritis, unspecified site: Secondary | ICD-10-CM | POA: Diagnosis not present

## 2022-03-21 DIAGNOSIS — E871 Hypo-osmolality and hyponatremia: Secondary | ICD-10-CM | POA: Diagnosis not present

## 2022-03-21 DIAGNOSIS — T8149XD Infection following a procedure, other surgical site, subsequent encounter: Secondary | ICD-10-CM | POA: Diagnosis not present

## 2022-03-21 DIAGNOSIS — F32A Depression, unspecified: Secondary | ICD-10-CM | POA: Diagnosis not present

## 2022-03-21 DIAGNOSIS — M069 Rheumatoid arthritis, unspecified: Secondary | ICD-10-CM | POA: Diagnosis not present

## 2022-03-21 DIAGNOSIS — Z7984 Long term (current) use of oral hypoglycemic drugs: Secondary | ICD-10-CM | POA: Diagnosis not present

## 2022-03-21 DIAGNOSIS — G8929 Other chronic pain: Secondary | ICD-10-CM | POA: Diagnosis not present

## 2022-03-21 DIAGNOSIS — Z9181 History of falling: Secondary | ICD-10-CM | POA: Diagnosis not present

## 2022-03-21 DIAGNOSIS — I509 Heart failure, unspecified: Secondary | ICD-10-CM | POA: Diagnosis not present

## 2022-03-21 DIAGNOSIS — M48061 Spinal stenosis, lumbar region without neurogenic claudication: Secondary | ICD-10-CM | POA: Diagnosis not present

## 2022-03-21 DIAGNOSIS — Z96652 Presence of left artificial knee joint: Secondary | ICD-10-CM | POA: Diagnosis not present

## 2022-03-21 DIAGNOSIS — I2510 Atherosclerotic heart disease of native coronary artery without angina pectoris: Secondary | ICD-10-CM | POA: Diagnosis not present

## 2022-03-21 DIAGNOSIS — Z87891 Personal history of nicotine dependence: Secondary | ICD-10-CM | POA: Diagnosis not present

## 2022-03-21 DIAGNOSIS — E785 Hyperlipidemia, unspecified: Secondary | ICD-10-CM | POA: Diagnosis not present

## 2022-03-21 DIAGNOSIS — I110 Hypertensive heart disease with heart failure: Secondary | ICD-10-CM | POA: Diagnosis not present

## 2022-03-21 DIAGNOSIS — T8189XA Other complications of procedures, not elsewhere classified, initial encounter: Secondary | ICD-10-CM | POA: Diagnosis not present

## 2022-03-21 DIAGNOSIS — E119 Type 2 diabetes mellitus without complications: Secondary | ICD-10-CM | POA: Diagnosis not present

## 2022-03-21 DIAGNOSIS — M797 Fibromyalgia: Secondary | ICD-10-CM | POA: Diagnosis not present

## 2022-03-23 DIAGNOSIS — M48061 Spinal stenosis, lumbar region without neurogenic claudication: Secondary | ICD-10-CM | POA: Diagnosis not present

## 2022-03-23 DIAGNOSIS — E119 Type 2 diabetes mellitus without complications: Secondary | ICD-10-CM | POA: Diagnosis not present

## 2022-03-23 DIAGNOSIS — I509 Heart failure, unspecified: Secondary | ICD-10-CM | POA: Diagnosis not present

## 2022-03-23 DIAGNOSIS — M797 Fibromyalgia: Secondary | ICD-10-CM | POA: Diagnosis not present

## 2022-03-23 DIAGNOSIS — M069 Rheumatoid arthritis, unspecified: Secondary | ICD-10-CM | POA: Diagnosis not present

## 2022-03-23 DIAGNOSIS — G8929 Other chronic pain: Secondary | ICD-10-CM | POA: Diagnosis not present

## 2022-03-23 DIAGNOSIS — I2510 Atherosclerotic heart disease of native coronary artery without angina pectoris: Secondary | ICD-10-CM | POA: Diagnosis not present

## 2022-03-23 DIAGNOSIS — F32A Depression, unspecified: Secondary | ICD-10-CM | POA: Diagnosis not present

## 2022-03-23 DIAGNOSIS — T8149XD Infection following a procedure, other surgical site, subsequent encounter: Secondary | ICD-10-CM | POA: Diagnosis not present

## 2022-03-23 DIAGNOSIS — M1990 Unspecified osteoarthritis, unspecified site: Secondary | ICD-10-CM | POA: Diagnosis not present

## 2022-03-23 DIAGNOSIS — E871 Hypo-osmolality and hyponatremia: Secondary | ICD-10-CM | POA: Diagnosis not present

## 2022-03-23 DIAGNOSIS — E785 Hyperlipidemia, unspecified: Secondary | ICD-10-CM | POA: Diagnosis not present

## 2022-03-23 DIAGNOSIS — Z96652 Presence of left artificial knee joint: Secondary | ICD-10-CM | POA: Diagnosis not present

## 2022-03-23 DIAGNOSIS — I110 Hypertensive heart disease with heart failure: Secondary | ICD-10-CM | POA: Diagnosis not present

## 2022-03-23 DIAGNOSIS — M545 Low back pain, unspecified: Secondary | ICD-10-CM | POA: Diagnosis not present

## 2022-03-23 DIAGNOSIS — Z7984 Long term (current) use of oral hypoglycemic drugs: Secondary | ICD-10-CM | POA: Diagnosis not present

## 2022-03-23 DIAGNOSIS — Z87891 Personal history of nicotine dependence: Secondary | ICD-10-CM | POA: Diagnosis not present

## 2022-03-23 DIAGNOSIS — Z9181 History of falling: Secondary | ICD-10-CM | POA: Diagnosis not present

## 2022-03-26 DIAGNOSIS — M1990 Unspecified osteoarthritis, unspecified site: Secondary | ICD-10-CM | POA: Diagnosis not present

## 2022-03-26 DIAGNOSIS — I110 Hypertensive heart disease with heart failure: Secondary | ICD-10-CM | POA: Diagnosis not present

## 2022-03-26 DIAGNOSIS — Z7984 Long term (current) use of oral hypoglycemic drugs: Secondary | ICD-10-CM | POA: Diagnosis not present

## 2022-03-26 DIAGNOSIS — I509 Heart failure, unspecified: Secondary | ICD-10-CM | POA: Diagnosis not present

## 2022-03-26 DIAGNOSIS — E871 Hypo-osmolality and hyponatremia: Secondary | ICD-10-CM | POA: Diagnosis not present

## 2022-03-26 DIAGNOSIS — M545 Low back pain, unspecified: Secondary | ICD-10-CM | POA: Diagnosis not present

## 2022-03-26 DIAGNOSIS — T8149XD Infection following a procedure, other surgical site, subsequent encounter: Secondary | ICD-10-CM | POA: Diagnosis not present

## 2022-03-26 DIAGNOSIS — Z87891 Personal history of nicotine dependence: Secondary | ICD-10-CM | POA: Diagnosis not present

## 2022-03-26 DIAGNOSIS — G8929 Other chronic pain: Secondary | ICD-10-CM | POA: Diagnosis not present

## 2022-03-26 DIAGNOSIS — Z9181 History of falling: Secondary | ICD-10-CM | POA: Diagnosis not present

## 2022-03-26 DIAGNOSIS — M48061 Spinal stenosis, lumbar region without neurogenic claudication: Secondary | ICD-10-CM | POA: Diagnosis not present

## 2022-03-26 DIAGNOSIS — E119 Type 2 diabetes mellitus without complications: Secondary | ICD-10-CM | POA: Diagnosis not present

## 2022-03-26 DIAGNOSIS — I2510 Atherosclerotic heart disease of native coronary artery without angina pectoris: Secondary | ICD-10-CM | POA: Diagnosis not present

## 2022-03-26 DIAGNOSIS — Z96652 Presence of left artificial knee joint: Secondary | ICD-10-CM | POA: Diagnosis not present

## 2022-03-26 DIAGNOSIS — M797 Fibromyalgia: Secondary | ICD-10-CM | POA: Diagnosis not present

## 2022-03-26 DIAGNOSIS — E785 Hyperlipidemia, unspecified: Secondary | ICD-10-CM | POA: Diagnosis not present

## 2022-03-26 DIAGNOSIS — F32A Depression, unspecified: Secondary | ICD-10-CM | POA: Diagnosis not present

## 2022-03-26 DIAGNOSIS — M069 Rheumatoid arthritis, unspecified: Secondary | ICD-10-CM | POA: Diagnosis not present

## 2022-03-27 ENCOUNTER — Ambulatory Visit: Payer: Self-pay | Admitting: *Deleted

## 2022-03-27 NOTE — Telephone Encounter (Signed)
[place] from [employer].   We are taking over phar. Services for [employer].   We do packaging.  [facility]   [facility] but  I need clarification on what she is taking.   There is a difference in what [facility] is saying and the pt. Says she is taking.  ? ?Coreg 6.25 mg is she taking that?    Last April last time it was filled. It needs a refill if she is to continue it. ? ?  Gemtesa 75 mg 1 daily.   This is not on her list but she is taking it.   Has 5 days left on it.  Not sure who prescribed this.   Want her to continue it? ? ? ? ?She's not taking the Zetia.   Phar. Will put that in her pillpack for her so she is compliant with taking it. ? ?She has difficulty getting to the pharmacy so not getting her meds.   We are going to set her up with pill packaging. ? ?Coreg and Gemtesa need refills.   If there is a problem or these 2 medications are not to be refilled please call [facility]. ? ?All the other medications have refills.   ? ?Anne Ng   [phone] this is her direct number. ? ? ? ?[facility] ?[address]  [address] ?[place] [address] ?[phone] phone ?[phone] Fax  ? ? ?Reason for Disposition ? Pharmacy calling with prescription question and triager answers question ? ?Answer Assessment - Initial Assessment Questions ?1. NAME of MEDICATION: "What medicine are you calling about?" ?    Pharmacist from [facility] needed clarification of her medication so [facility] can [facility] to [facility] for [facility]. ?2. QUESTION: "What is your question?" (e.g., double dose of medicine, side effect) ?    See documentation notes. ?3. PRESCRIBING HCP: "Who prescribed it?" Reason: if prescribed by specialist, call should be referred to that group. ?    Dr. Brita Romp ?4. SYMPTOMS: "Do you have any symptoms?" ?    *No Answer* ?5. SEVERITY: If symptoms are present, ask "Are they mild, moderate or severe?" ?    *No Answer* ?6. PREGNANCY:  "Is  there any chance that you are pregnant?" "When was your last menstrual period?" ?    *No Answer* ? ?Protocols used: Medication Question Call-A-AH ? ?

## 2022-03-28 ENCOUNTER — Other Ambulatory Visit: Payer: Self-pay | Admitting: Family Medicine

## 2022-03-28 DIAGNOSIS — I509 Heart failure, unspecified: Secondary | ICD-10-CM | POA: Diagnosis not present

## 2022-03-28 DIAGNOSIS — Z7984 Long term (current) use of oral hypoglycemic drugs: Secondary | ICD-10-CM | POA: Diagnosis not present

## 2022-03-28 DIAGNOSIS — F32A Depression, unspecified: Secondary | ICD-10-CM | POA: Diagnosis not present

## 2022-03-28 DIAGNOSIS — M48061 Spinal stenosis, lumbar region without neurogenic claudication: Secondary | ICD-10-CM | POA: Diagnosis not present

## 2022-03-28 DIAGNOSIS — M1990 Unspecified osteoarthritis, unspecified site: Secondary | ICD-10-CM | POA: Diagnosis not present

## 2022-03-28 DIAGNOSIS — E119 Type 2 diabetes mellitus without complications: Secondary | ICD-10-CM | POA: Diagnosis not present

## 2022-03-28 DIAGNOSIS — T8149XD Infection following a procedure, other surgical site, subsequent encounter: Secondary | ICD-10-CM | POA: Diagnosis not present

## 2022-03-28 DIAGNOSIS — M545 Low back pain, unspecified: Secondary | ICD-10-CM | POA: Diagnosis not present

## 2022-03-28 DIAGNOSIS — I110 Hypertensive heart disease with heart failure: Secondary | ICD-10-CM | POA: Diagnosis not present

## 2022-03-28 DIAGNOSIS — G8929 Other chronic pain: Secondary | ICD-10-CM | POA: Diagnosis not present

## 2022-03-28 DIAGNOSIS — I2510 Atherosclerotic heart disease of native coronary artery without angina pectoris: Secondary | ICD-10-CM | POA: Diagnosis not present

## 2022-03-28 DIAGNOSIS — Z96652 Presence of left artificial knee joint: Secondary | ICD-10-CM | POA: Diagnosis not present

## 2022-03-28 DIAGNOSIS — M797 Fibromyalgia: Secondary | ICD-10-CM | POA: Diagnosis not present

## 2022-03-28 DIAGNOSIS — E871 Hypo-osmolality and hyponatremia: Secondary | ICD-10-CM | POA: Diagnosis not present

## 2022-03-28 DIAGNOSIS — Z9181 History of falling: Secondary | ICD-10-CM | POA: Diagnosis not present

## 2022-03-28 DIAGNOSIS — M069 Rheumatoid arthritis, unspecified: Secondary | ICD-10-CM | POA: Diagnosis not present

## 2022-03-28 DIAGNOSIS — Z87891 Personal history of nicotine dependence: Secondary | ICD-10-CM | POA: Diagnosis not present

## 2022-03-28 DIAGNOSIS — E785 Hyperlipidemia, unspecified: Secondary | ICD-10-CM | POA: Diagnosis not present

## 2022-03-28 NOTE — Telephone Encounter (Signed)
Requested medication (s) are due for refill today: Yes ? ?Requested medication (s) are on the active medication list: Yes to Lyrica, No to [place] ? ?Last refill:  03/05/22 for Lyrica; unknown for Gemtasa ? ?Future visit scheduled: Yes ? ?Notes to clinic:  Unable to refill per protocol, cannot delegate. ? ? ? ? ? ?Requested Prescriptions  ?Pending Prescriptions Disp Refills  ? GEMTESA 75 MG TABS [Pharmacy Med Name: Gemtesa 75 mg tablet] 90 tablet 3  ?  Sig: NEW PRESCRIPTION REQUEST: TAKE ONE TABLET BY MOUTH DAILY  ?  ? Off-Protocol Failed - 03/28/2022  2:56 PM  ?  ?  Failed - Medication not assigned to a protocol, review manually.  ?  ?  Passed - Valid encounter within last 12 months  ?  Recent Outpatient Visits   ? ?      ? 2 months ago Biceps tendinitis of right upper extremity  ? [facility] Mins, Ria Comment, PA-C  ? 5 months ago Type 2 diabetes mellitus with diabetic polyneuropathy, without long-term current use of insulin ([place])  ? [facility], Dionne Bucy, MD  ? 8 months ago Type 2 diabetes mellitus with diabetic polyneuropathy, without long-term current use of insulin ([place])  ? [facility] [place], Dionne Bucy, MD  ? 11 months ago Hypertension associated with diabetes [facility])  ? [facility] [place], Dionne Bucy, MD  ? 1 year ago Housing instability  ? [facility]al[facility], Dionne Bucy, MD  ? ?  ?  ?Future Appointments   ? ?        ? In 5 days Bacigalupo, Dionne Bucy, MD [facility]  ? ?  ? ? ?  ?  ?  ? pregabalin (LYRICA) 75 MG capsule [Pharmacy Med Name: pregabalin 75 mg capsule] 270 capsule 1  ?  Sig: NEW PRESCRIPTION REQUEST: TAKE ONE CAPSULE BY MOUTH THREE TIMES DAILY  ?  ? Not Delegated - Neurology:  Anticonvulsants - Controlled - pregabalin Failed - 03/28/2022  2:56 PM  ?  ?  Failed - This refill cannot be delegated  ?  ?  Passed - Cr in normal range and within 360 days  ?  Creatinine  ?Date Value Ref Range Status   ?08/31/2014 0.63 0.60 - 1.30 mg/dL Final  ? ?Creatinine, Ser  ?Date Value Ref Range Status  ?03/14/2022 0.70 0.44 - 1.00 mg/dL Final  ?  ?  ?  ?  Passed - Completed PHQ-2 or PHQ-9 in the last 360 days  ?  ?  Passed - Valid encounter within last 12 months  ?  Recent Outpatient Visits   ? ?      ? 2 months ago Biceps tendinitis of right upper extremity  ? [facility] Thed[facility] Mins, Ria Comment, PA-C  ? 5 months ago Type 2 diabetes mellitus with diabetic polyneuropathy, without long-term current use of insulin ([place])  ? [facility] [place], Dionne Bucy, MD  ? 8 months ago Type 2 diabetes mellitus with diabetic polyneuropathy, without long-term current use of insulin ([place])  ? [facility] [place], Dionne Bucy, MD  ? 11 months ago Hypertension associated with diabetes [facility])  ? [facility], Dionne Bucy, MD  ? 1 year ago Housing instability  ? [facility] Bacigalupo, Dionne Bucy, MD  ? ?  ?  ?Future Appointments   ? ?        ? In 5 days Bacigalupo, Dionne Bucy, MD [facility], [facility]  ? ?  ? ? ?  ?  ?  ?Signed Prescriptions Disp Refills  ? buPROPion (WELLBUTRIN XL) 300 MG 24 hr tablet 90 tablet 0  ?  Sig: NEW PRESCRIPTION REQUEST: TAKE ONE TABLET BY MOUTH DAILY  ?  ? Psychiatry: Antidepressants - bupropion Failed - 03/28/2022  2:56 PM  ?  ?  Failed - AST in normal range and within 360 days  ?  AST  ?Date Value Ref Range Status  ?03/14/2022 11 (L) 15 - 41 U/L Final  ?  ?  ?  ?  Failed - Last BP in normal range  ?  BP Readings from Last 1 Encounters:  ?03/16/22 (!) 144/47  ?  ?  ?  ?  Passed - Cr in normal range and within 360 days  ?  Creatinine  ?Date Value Ref Range Status  ?08/31/2014 0.63 0.60 - 1.30 mg/dL Final  ? ?Creatinine, Ser  ?Date Value Ref Range Status  ?03/14/2022 0.70 0.44 - 1.00 mg/dL Final  ?  ?  ?  ?  Passed - ALT in normal range and within 360 days  ?  ALT  ?Date Value Ref Range Status  ?03/14/2022 9 0 - 44 U/L Final  ?  ?  ?  ?   Passed - Completed PHQ-2 or PHQ-9 in the last 360 days  ?  ?  Passed - Valid encounter within last 6 months  ?  Recent Outpatient Visits   ? ?      ? 2 months ago Biceps tendinitis of right upper extremity  ? [facility] Thed[facility] Mins, Ria Comment, PA-C  ? 5 months ago Type 2 diabetes mellitus with diabetic polyneuropathy, without long-term current use of insulin ([place])  ? [facility] [place], Dionne Bucy, MD  ? 8 months ago Type 2 diabetes mellitus with diabetic polyneuropathy, without long-term current use of insulin ([place])  ? [facility] [place], Dionne Bucy, MD  ? 11 months ago Hypertension associated with diabetes [facility])  ? [facility] [place], Dionne Bucy, MD  ? 1 year ago Housing instability  ? [facility] [place], Dionne Bucy, MD  ? ?  ?  ?Future Appointments   ? ?        ? In 5 days Bacigalupo, Dionne Bucy, MD [facility], [facility]  ? ?  ? ? ?  ?  ?  ? carvedilol (COREG) 6.25 MG tablet 180 tablet 0  ?  Sig: NEW PRESCRIPTION REQUEST: TAKE ONE TABLET BY MOUTH TWICE DAILY  ?  ? Cardiovascular: Beta Blockers 3 Failed - 03/28/2022  2:56 PM  ?  ?  Failed - AST in normal range and within 360 days  ?  AST  ?Date Value Ref Range Status  ?03/14/2022 11 (L) 15 - 41 U/L Final  ?  ?  ?  ?  Failed - Last BP in normal range  ?  BP Readings from Last 1 Encounters:  ?03/16/22 (!) 144/47  ?  ?  ?  ?  Passed - Cr in normal range and within 360 days  ?  Creatinine  ?Date Value Ref Range Status  ?08/31/2014 0.63 0.60 - 1.30 mg/dL Final  ? ?Creatinine, Ser  ?Date Value Ref Range Status  ?03/14/2022 0.70 0.44 - 1.00 mg/dL Final  ?  ?  ?  ?  Passed - ALT in normal range and within 360 days  ?  ALT  ?Date Value Ref Range Status  ?03/14/2022  9 0 - 44 U/L Final  ?  ?  ?  ?  Passed - Last Heart Rate in normal range  ?  Pulse Readings from Last 1 Encounters:  ?03/16/22 72  ?  ?  ?  ?  Passed - Valid encounter within last 6 months  ?  Recent Outpatient Visits   ? ?       ? 2 months ago Biceps tendinitis of right upper extremity  ? [facility] The[facility] Mins, Ria Comment, PA-C  ? 5 months ago Type 2 diabetes mellitus with diabetic polyneuropathy, without long-term current use of insulin ([place])  ? [facility] [place], Dionne Bucy, MD  ? 8 months ago Type 2 diabetes mellitus with diabetic polyneuropathy, without long-term current use of insulin ([place])  ? [facility] [place], Dionne Bucy, MD  ? 11 months ago Hypertension associated with diabetes [facility])  ? [facility] [place], Dionne Bucy, MD  ? 1 year ago Housing instability  ? [facility] Bacigal[place], Dionne Bucy, MD  ? ?  ?  ?Future Appointments   ? ?        ? In 5 days Bacigalupo, Dionne Bucy, MD [facility], [facility]  ? ?  ? ? ?  ?  ?  ? metFORMIN (GLUCOPHAGE) 500 MG tablet 180 tablet 0  ?  Sig: NEW PRESCRIPTION REQUEST: TAKE ONE TABLET BY MOUTH TWICE DAILY  ?  ? Endocrinology:  Diabetes - Biguanides Passed - 03/28/2022  2:56 PM  ?  ?  Passed - Cr in normal range and within 360 days  ?  Creatinine  ?Date Value Ref Range Status  ?08/31/2014 0.63 0.60 - 1.30 mg/dL Final  ? ?Creatinine, Ser  ?Date Value Ref Range Status  ?03/14/2022 0.70 0.44 - 1.00 mg/dL Final  ?  ?  ?  ?  Passed - HBA1C is between 0 and 7.9 and within 180 days  ?  Hgb A1c MFr Bld  ?Date Value Ref Range Status  ?03/03/2022 7.4 (H) 4.8 - 5.6 % Final  ?  Comment:  ?  (NOTE) ?Pre diabetes:          5.7%-6.4% ? ?Diabetes:              >6.4% ? ?Glycemic control for   <7.0% ?adults with diabetes ?  ?  ?  ?  ?  Passed - eGFR in normal range and within 360 days  ?  EGFR (African American)  ?Date Value Ref Range Status  ?08/31/2014 >60 >30m/min Final  ?11/05/2013 >60  Final  ? ?GFR calc Af AWyvonnia Lora ?Date Value Ref Range Status  ?12/21/2020 72 >59 mL/min/1.73 Final  ?  Comment:  ?  **In accordance with recommendations from the NKF-ASN Task force,** ?  [facility] is in the process of updating its eGFR calculation to the ?   2021 CKD-EPI creatinine equation that estimates kidney function ?  without a race variable. ?  ? ?EGFR (Non-African Amer.)  ?Date Value Ref Range Status  ?08/31/2014 >60 >638mmin Final  ?  Comment:

## 2022-03-28 NOTE — Telephone Encounter (Signed)
Requested medication (s) are due for refill today: Yes ? ?Requested medication (s) are on the active medication list: Yes ? ?Last refill:  03/05/22 ? ?Future visit scheduled: Yes ? ?Notes to clinic:  Resend to another pharmacy. Unable to refill per protocol, cannot delegate. ? ? ? ? ? ?Requested Prescriptions  ?Pending Prescriptions Disp Refills  ? pregabalin (LYRICA) 75 MG capsule 90 capsule 1  ?  Sig: Take 1 capsule (75 mg total) by mouth 3 (three) times daily.  ?  ? Not Delegated - Neurology:  Anticonvulsants - Controlled - pregabalin Failed - 03/28/2022  4:00 PM  ?  ?  Failed - This refill cannot be delegated  ?  ?  Passed - Cr in normal range and within 360 days  ?  Creatinine  ?Date Value Ref Range Status  ?08/31/2014 0.63 0.60 - 1.30 mg/dL Final  ? ?Creatinine, Ser  ?Date Value Ref Range Status  ?03/14/2022 0.70 0.44 - 1.00 mg/dL Final  ?  ?  ?  ?  Passed - Completed PHQ-2 or PHQ-9 in the last 360 days  ?  ?  Passed - Valid encounter within last 12 months  ?  Recent Outpatient Visits   ? ?      ? 2 months ago Biceps tendinitis of right upper extremity  ? [facility] Mins, Ria Comment, PA-C  ? 5 months ago Type 2 diabetes mellitus with diabetic polyneuropathy, without long-term current use of insulin ([place])  ? [facility] [place], Dionne Bucy, MD  ? 8 months ago Type 2 diabetes mellitus with diabetic polyneuropathy, without long-term current use of insulin ([place])  ? [facility] [place], Dionne Bucy, MD  ? 11 months ago Hypertension associated with diabetes [facility])  ? [facility] [place], Dionne Bucy, MD  ? 1 year ago Housing instability  ? [facility] Bacig[place], Dionne Bucy, MD  ? ?  ?  ?Future Appointments   ? ?        ? In 5 days Bacigalupo, Dionne Bucy, MD [facility]  ? ?  ? ? ?  ?  ?  ? ? ? ? ?

## 2022-03-28 NOTE — Telephone Encounter (Signed)
Medication Refill - Medication: pregabalin (LYRICA) 75 MG capsule ? ?Has the patient contacted their pharmacy? Yes.   ?(Agent: If no, request that the patient contact the pharmacy for the refill. If patient does not wish to contact the pharmacy document the reason why and proceed with request.) ?(Agent: If yes, when and what did the pharmacy advise?) ? ?Preferred Pharmacy (with phone number or street name): [place]  ? ?[facility] [place] - [address]  ?[address] [place] [address]  ?Phone: [phone] Fax: [phone]  ? ? ?Has the patient been seen for an appointment in the last year OR does the patient have an upcoming appointment? Yes.   ? ?Agent: Please be advised that RX refills may take up to 3 business days. We ask that you follow-up with your pharmacy. ? ?

## 2022-03-29 NOTE — Telephone Encounter (Signed)
Requested medication (s) are due for refill today- no ? ?Requested medication (s) are on the active medication list -yes ? ?Future visit scheduled -yes ? ?Last refill: 03/05/22 #90 1RF ? ?Notes to clinic: Request RF: too soon, non delegated Rx ? ?Requested Prescriptions  ?Pending Prescriptions Disp Refills  ? pregabalin (LYRICA) 75 MG capsule 90 capsule 1  ?  Sig: Take 1 capsule (75 mg total) by mouth 3 (three) times daily.  ?  ? Not Delegated - Neurology:  Anticonvulsants - Controlled - pregabalin Failed - 03/29/2022  1:39 PM  ?  ?  Failed - This refill cannot be delegated  ?  ?  Passed - Cr in normal range and within 360 days  ?  Creatinine  ?Date Value Ref Range Status  ?08/31/2014 0.63 0.60 - 1.30 mg/dL Final  ? ?Creatinine, Ser  ?Date Value Ref Range Status  ?03/14/2022 0.70 0.44 - 1.00 mg/dL Final  ?  ?  ?  ?  Passed - Completed PHQ-2 or PHQ-9 in the last 360 days  ?  ?  Passed - Valid encounter within last 12 months  ?  Recent Outpatient Visits   ? ?      ? 2 months ago Biceps tendinitis of right upper extremity  ? [facility] Thedore Mins, Ria Comment, PA-C  ? 5 months ago Type 2 diabetes mellitus with diabetic polyneuropathy, without long-term current use of insulin ([place])  ? [facility] [place], Dionne Bucy, MD  ? 8 months ago Type 2 diabetes mellitus with diabetic polyneuropathy, without long-term current use of insulin ([place])  ? [facility] [place], Dionne Bucy, MD  ? 11 months ago Hypertension associated with diabetes [facility])  ? [facility] [place], Dionne Bucy, MD  ? 1 year ago Housing instability  ? [facility] Bacigal[place], Dionne Bucy, MD  ? ?  ?  ?Future Appointments   ? ?        ? In 4 days Bacigalupo, Dionne Bucy, MD [facility], [facility]  ? ?  ? ? ?  ?  ?  ? ? ? ?Requested Prescriptions  ?Pending Prescriptions Disp Refills  ? pregabalin (LYRICA) 75 MG capsule 90 capsule 1  ?  Sig: Take 1 capsule (75 mg total) by mouth 3 (three) times  daily.  ?  ? Not Delegated - Neurology:  Anticonvulsants - Controlled - pregabalin Failed - 03/29/2022  1:39 PM  ?  ?  Failed - This refill cannot be delegated  ?  ?  Passed - Cr in normal range and within 360 days  ?  Creatinine  ?Date Value Ref Range Status  ?08/31/2014 0.63 0.60 - 1.30 mg/dL Final  ? ?Creatinine, Ser  ?Date Value Ref Range Status  ?03/14/2022 0.70 0.44 - 1.00 mg/dL Final  ?  ?  ?  ?  Passed - Completed PHQ-2 or PHQ-9 in the last 360 days  ?  ?  Passed - Valid encounter within last 12 months  ?  Recent Outpatient Visits   ? ?      ? 2 months ago Biceps tendinitis of right upper extremity  ? [facility], Ria Comment, PA-C  ? 5 months ago Type 2 diabetes mellitus with diabetic polyneuropathy, without long-term current use of insulin ([place])  ? [facility] [place], Dionne Bucy, MD  ? 8 months ago Type 2 diabetes mellitus with diabetic polyneuropathy, without long-term current use of insulin ([place])  ? [facility] [place], [place],  MD  ? 11 months ago Hypertension associated with diabetes ([place])  ? [facility] [place], Dionne Bucy, MD  ? 1 year ago Housing instability  ? [facility] Bacigal[place], Dionne Bucy, MD  ? ?  ?  ?Future Appointments   ? ?        ? In 4 days Bacigalupo, Dionne Bucy, MD [facility], [facility]  ? ?  ? ? ?  ?  ?  ? ? ? ?

## 2022-03-29 NOTE — Telephone Encounter (Signed)
pregabalin (LYRICA) 75 MG capsule 90 capsule 1 03/05/2022    ?Sig - Route: Take 1 capsule (75 mg total) by mouth 3 (three) times daily. - Oral   ?Sent to pharmacy as: pregabalin (LYRICA) 75 MG capsule   ?   ?   ?Pt has called pharmacy to liaison for her. They state she is almost out of med and is housebound so they were offering to mail for her. Pharmacy states never got the 1st refill the first of April since she is homebound and they are trying to assist. Pharmacy wants more info if not sent today since she reached out to them. ?[facility] - [place], [place] - [address]  ?[address] [place] [address]  ?Phone: [phone] Fax: [phone]  ? ?

## 2022-03-30 DIAGNOSIS — Z9181 History of falling: Secondary | ICD-10-CM | POA: Diagnosis not present

## 2022-03-30 DIAGNOSIS — M797 Fibromyalgia: Secondary | ICD-10-CM | POA: Diagnosis not present

## 2022-03-30 DIAGNOSIS — Z7984 Long term (current) use of oral hypoglycemic drugs: Secondary | ICD-10-CM | POA: Diagnosis not present

## 2022-03-30 DIAGNOSIS — I110 Hypertensive heart disease with heart failure: Secondary | ICD-10-CM | POA: Diagnosis not present

## 2022-03-30 DIAGNOSIS — F32A Depression, unspecified: Secondary | ICD-10-CM | POA: Diagnosis not present

## 2022-03-30 DIAGNOSIS — Z87891 Personal history of nicotine dependence: Secondary | ICD-10-CM | POA: Diagnosis not present

## 2022-03-30 DIAGNOSIS — M48061 Spinal stenosis, lumbar region without neurogenic claudication: Secondary | ICD-10-CM | POA: Diagnosis not present

## 2022-03-30 DIAGNOSIS — M545 Low back pain, unspecified: Secondary | ICD-10-CM | POA: Diagnosis not present

## 2022-03-30 DIAGNOSIS — G8929 Other chronic pain: Secondary | ICD-10-CM | POA: Diagnosis not present

## 2022-03-30 DIAGNOSIS — I509 Heart failure, unspecified: Secondary | ICD-10-CM | POA: Diagnosis not present

## 2022-03-30 DIAGNOSIS — M1990 Unspecified osteoarthritis, unspecified site: Secondary | ICD-10-CM | POA: Diagnosis not present

## 2022-03-30 DIAGNOSIS — E119 Type 2 diabetes mellitus without complications: Secondary | ICD-10-CM | POA: Diagnosis not present

## 2022-03-30 DIAGNOSIS — T8149XD Infection following a procedure, other surgical site, subsequent encounter: Secondary | ICD-10-CM | POA: Diagnosis not present

## 2022-03-30 DIAGNOSIS — Z96652 Presence of left artificial knee joint: Secondary | ICD-10-CM | POA: Diagnosis not present

## 2022-03-30 DIAGNOSIS — M069 Rheumatoid arthritis, unspecified: Secondary | ICD-10-CM | POA: Diagnosis not present

## 2022-03-30 DIAGNOSIS — E785 Hyperlipidemia, unspecified: Secondary | ICD-10-CM | POA: Diagnosis not present

## 2022-03-30 DIAGNOSIS — E871 Hypo-osmolality and hyponatremia: Secondary | ICD-10-CM | POA: Diagnosis not present

## 2022-03-30 DIAGNOSIS — I2510 Atherosclerotic heart disease of native coronary artery without angina pectoris: Secondary | ICD-10-CM | POA: Diagnosis not present

## 2022-04-02 ENCOUNTER — Telehealth: Payer: Self-pay | Admitting: Plastic Surgery

## 2022-04-02 ENCOUNTER — Encounter: Payer: Self-pay | Admitting: Family Medicine

## 2022-04-02 ENCOUNTER — Ambulatory Visit (INDEPENDENT_AMBULATORY_CARE_PROVIDER_SITE_OTHER): Payer: Medicare Other | Admitting: Family Medicine

## 2022-04-02 VITALS — BP 117/60 | HR 75 | Temp 97.9°F | Resp 16 | Wt 231.0 lb

## 2022-04-02 DIAGNOSIS — T8130XA Disruption of wound, unspecified, initial encounter: Secondary | ICD-10-CM

## 2022-04-02 DIAGNOSIS — F32A Depression, unspecified: Secondary | ICD-10-CM | POA: Diagnosis not present

## 2022-04-02 DIAGNOSIS — I110 Hypertensive heart disease with heart failure: Secondary | ICD-10-CM | POA: Diagnosis not present

## 2022-04-02 DIAGNOSIS — Z7984 Long term (current) use of oral hypoglycemic drugs: Secondary | ICD-10-CM | POA: Diagnosis not present

## 2022-04-02 DIAGNOSIS — E785 Hyperlipidemia, unspecified: Secondary | ICD-10-CM | POA: Diagnosis not present

## 2022-04-02 DIAGNOSIS — I2510 Atherosclerotic heart disease of native coronary artery without angina pectoris: Secondary | ICD-10-CM | POA: Diagnosis not present

## 2022-04-02 DIAGNOSIS — Z87891 Personal history of nicotine dependence: Secondary | ICD-10-CM | POA: Diagnosis not present

## 2022-04-02 DIAGNOSIS — I509 Heart failure, unspecified: Secondary | ICD-10-CM | POA: Diagnosis not present

## 2022-04-02 DIAGNOSIS — M545 Low back pain, unspecified: Secondary | ICD-10-CM | POA: Diagnosis not present

## 2022-04-02 DIAGNOSIS — Z9181 History of falling: Secondary | ICD-10-CM | POA: Diagnosis not present

## 2022-04-02 DIAGNOSIS — T8149XD Infection following a procedure, other surgical site, subsequent encounter: Secondary | ICD-10-CM | POA: Diagnosis not present

## 2022-04-02 DIAGNOSIS — T8140XA Infection following a procedure, unspecified, initial encounter: Secondary | ICD-10-CM | POA: Diagnosis not present

## 2022-04-02 DIAGNOSIS — M48061 Spinal stenosis, lumbar region without neurogenic claudication: Secondary | ICD-10-CM | POA: Diagnosis not present

## 2022-04-02 DIAGNOSIS — E119 Type 2 diabetes mellitus without complications: Secondary | ICD-10-CM | POA: Diagnosis not present

## 2022-04-02 DIAGNOSIS — M069 Rheumatoid arthritis, unspecified: Secondary | ICD-10-CM | POA: Diagnosis not present

## 2022-04-02 DIAGNOSIS — Z96652 Presence of left artificial knee joint: Secondary | ICD-10-CM | POA: Diagnosis not present

## 2022-04-02 DIAGNOSIS — M7501 Adhesive capsulitis of right shoulder: Secondary | ICD-10-CM | POA: Diagnosis not present

## 2022-04-02 DIAGNOSIS — M797 Fibromyalgia: Secondary | ICD-10-CM | POA: Diagnosis not present

## 2022-04-02 DIAGNOSIS — G8929 Other chronic pain: Secondary | ICD-10-CM | POA: Diagnosis not present

## 2022-04-02 DIAGNOSIS — E871 Hypo-osmolality and hyponatremia: Secondary | ICD-10-CM | POA: Diagnosis not present

## 2022-04-02 DIAGNOSIS — M7502 Adhesive capsulitis of left shoulder: Secondary | ICD-10-CM

## 2022-04-02 DIAGNOSIS — M1990 Unspecified osteoarthritis, unspecified site: Secondary | ICD-10-CM | POA: Diagnosis not present

## 2022-04-02 MED ORDER — MELOXICAM 15 MG PO TABS: 15.0000 mg | ORAL_TABLET | Freq: Every day | ORAL | 0 refills | Status: DC

## 2022-04-02 MED ORDER — PREGABALIN 75 MG PO CAPS: 75.0000 mg | ORAL_CAPSULE | Freq: Three times a day (TID) | ORAL | 1 refills | Status: DC

## 2022-04-02 NOTE — Telephone Encounter (Signed)
[name] [name] w/[facility] stated that she has been attending to the pt wound and it sorbact came off on Friday and the wound bed the edges are looking good and the bottom is not connected it is needing attention.  A wound vac was not put back on the wound due to the fatty tissue was visible.  Pt was advise to get an appointment with the provider to have the wound checked and debrided.  If you should have any question you can all [name] [name] for more information. ?

## 2022-04-02 NOTE — Progress Notes (Signed)
?  ? ?I,Joseline E Rosas,acting as a scribe for Lavon Paganini, MD.,have documented all relevant documentation on the behalf of Lavon Paganini, MD,as directed by  Lavon Paganini, MD while in the presence of Lavon Paganini, MD.  ? ?Established patient visit ? ? ?Patient: [name] [name]   DOB: 12/15/1950   71 y.o. Female  MRN: [id number] ?Visit Date: 04/02/2022 ? ?Today's healthcare provider: Lavon Paganini, MD  ? ?Chief Complaint  ?Patient presents with  ? Shoulder Pain  ? ?Subjective  ?  ?Shoulder Pain  ?The pain is present in the right shoulder. This is a new problem. The current episode started more than 1 month ago. There has been no history of extremity trauma. The problem occurs constantly. The problem has been gradually worsening. The quality of the pain is described as sharp (with movemnet). Associated symptoms include an inability to bear weight and a limited range of motion. Pertinent negatives include no joint swelling, numbness, stiffness or tingling. She has tried nothing (steroid injection given at last office visit) for the symptoms.   ? ?Unable to reach overhead with either arm.  Never had this before.  Saw Ortho previously (last visit 11/07/21) and no surgical findings, benign XRay.  Has not done PT. ? ?Medications: ?Outpatient Medications Prior to Visit  ?Medication Sig  ? acetaminophen (TYLENOL) 500 MG tablet Take 1,000 mg by mouth every 6 (six) hours as needed for mild pain.  ? Biotin w/ Vitamins C & E (HAIR/SKIN/NAILS PO) Take 1 tablet by mouth daily.  ? buPROPion (WELLBUTRIN XL) 300 MG 24 hr tablet NEW PRESCRIPTION REQUEST: TAKE ONE TABLET BY MOUTH DAILY  ? carvedilol (COREG) 6.25 MG tablet NEW PRESCRIPTION REQUEST: TAKE ONE TABLET BY MOUTH TWICE DAILY  ? DULoxetine (CYMBALTA) 60 MG capsule NEW PRESCRIPTION REQUEST: TAKE ONE CAPSULE BY MOUTH DAILY  ? ezetimibe (ZETIA) 10 MG tablet TAKE 1 TABLET BY MOUTH EVERY DAY (Patient taking differently: Take 10 mg by mouth daily.)  ? GEMTESA 75  MG TABS Take 1 tablet by mouth daily.  ? metFORMIN (GLUCOPHAGE) 500 MG tablet NEW PRESCRIPTION REQUEST: TAKE ONE TABLET BY MOUTH TWICE DAILY  ? polyethylene glycol (MIRALAX / GLYCOLAX) 17 g packet Take 17 g by mouth daily as needed for mild constipation.  ? tolterodine (DETROL LA) 4 MG 24 hr capsule Take 4 mg by mouth daily.  ? [DISCONTINUED] pregabalin (LYRICA) 75 MG capsule Take 1 capsule (75 mg total) by mouth 3 (three) times daily.  ? [DISCONTINUED] oxyCODONE (OXY IR/ROXICODONE) 5 MG immediate release tablet Take 1 tablet (5 mg total) by mouth every 6 (six) hours as needed for severe pain. (Patient not taking: Reported on 03/14/2022)  ? [DISCONTINUED] senna-docusate (SENOKOT-S) 8.6-50 MG tablet Take 2 tablets by mouth 2 (two) times daily. (Patient not taking: Reported on 03/14/2022)  ? [DISCONTINUED] vitamin B-12 1000 MCG tablet Take 1 tablet (1,000 mcg total) by mouth daily. (Patient not taking: Reported on 03/14/2022)  ? ?No facility-administered medications prior to visit.  ? ? ?Review of Systems  ?Musculoskeletal:  Negative for stiffness.  ?Neurological:  Negative for tingling and numbness.  ? ? ?  Objective  ?  ?BP 117/60 Comment: home readings  Pulse 75   Temp 97.9 ?F (36.6 ?C) (Oral)   Resp 16   Wt 231 lb (104.8 kg)   LMP  (LMP Unknown) Comment: age 66  BMI 36.17 kg/m?  ? ? ?Physical Exam ?Vitals reviewed.  ?Constitutional:   ?   General: She is not in acute  distress. ?   Appearance: She is well-developed.  ?HENT:  ?   Head: Normocephalic and atraumatic.  ?Eyes:  ?   General: No scleral icterus. ?   Conjunctiva/sclera: Conjunctivae normal.  ?Cardiovascular:  ?   Rate and Rhythm: Normal rate and regular rhythm.  ?Pulmonary:  ?   Effort: Pulmonary effort is normal. No respiratory distress.  ?Musculoskeletal:  ?   Comments: B/l shoulders with limited external rotation, abduction, flexion.  Pain with ROM in the R.    ?Skin: ?   General: Skin is warm and dry.  ?   Findings: No rash.  ?Neurological:  ?    Mental Status: She is alert and oriented to person, place, and time.  ?Psychiatric:     ?   Behavior: Behavior normal.  ?  ? ? ?No results found for any visits on 04/02/22. ? Assessment & Plan  ?  ? ?Problem List Items Addressed This Visit   ? ?  ? Other  ? Post op infection  ? Relevant Orders  ? Ambulatory referral to Home Health  ? Wound dehiscence  ? Relevant Orders  ? Ambulatory referral to Home Health  ? ?Other Visit Diagnoses   ? ? Adhesive capsulitis of both shoulders    -  Primary  ? Relevant Medications  ? meloxicam (MOBIC) 15 MG tablet  ? Other Relevant Orders  ? Ambulatory referral to Home Health  ? ?  ? - new problem ?- both shoulders are frozen, pain in R shoulder ?- had recent XRays with [facility] - reviewed images - no indication for repeat imaging at this time ?- will start [facility] PT (unable to leave home independently related to post-op wound dehisence of abd) ?- mobic, ice, rest ?- will need sports med or ortho referral for further management if not improving with PT ?- will not use prednisone at this time due to recent infection ? ? ? ?Return in about 2 weeks (around 04/16/2022) for AWV, as scheduled.  ?   ? ?I, Lavon Paganini, MD, have reviewed all documentation for this visit. The documentation on 04/02/22 for the exam, diagnosis, procedures, and orders are all accurate and complete. ? ? ?Virginia Crews, MD, MPH ?[place] [facility]   ?

## 2022-04-03 NOTE — Telephone Encounter (Signed)
Called pt and she agreed to come in to see Donna Christen on Friday, 5/5/ @ 11:40 am. Confirmed with Sabba that 11:40 am was ok to schedule.  ?

## 2022-04-04 DIAGNOSIS — M545 Low back pain, unspecified: Secondary | ICD-10-CM | POA: Diagnosis not present

## 2022-04-04 DIAGNOSIS — E871 Hypo-osmolality and hyponatremia: Secondary | ICD-10-CM | POA: Diagnosis not present

## 2022-04-04 DIAGNOSIS — E785 Hyperlipidemia, unspecified: Secondary | ICD-10-CM | POA: Diagnosis not present

## 2022-04-04 DIAGNOSIS — Z96652 Presence of left artificial knee joint: Secondary | ICD-10-CM | POA: Diagnosis not present

## 2022-04-04 DIAGNOSIS — M48061 Spinal stenosis, lumbar region without neurogenic claudication: Secondary | ICD-10-CM | POA: Diagnosis not present

## 2022-04-04 DIAGNOSIS — G8929 Other chronic pain: Secondary | ICD-10-CM | POA: Diagnosis not present

## 2022-04-04 DIAGNOSIS — I2510 Atherosclerotic heart disease of native coronary artery without angina pectoris: Secondary | ICD-10-CM | POA: Diagnosis not present

## 2022-04-04 DIAGNOSIS — M1990 Unspecified osteoarthritis, unspecified site: Secondary | ICD-10-CM | POA: Diagnosis not present

## 2022-04-04 DIAGNOSIS — Z9181 History of falling: Secondary | ICD-10-CM | POA: Diagnosis not present

## 2022-04-04 DIAGNOSIS — E119 Type 2 diabetes mellitus without complications: Secondary | ICD-10-CM | POA: Diagnosis not present

## 2022-04-04 DIAGNOSIS — I509 Heart failure, unspecified: Secondary | ICD-10-CM | POA: Diagnosis not present

## 2022-04-04 DIAGNOSIS — M069 Rheumatoid arthritis, unspecified: Secondary | ICD-10-CM | POA: Diagnosis not present

## 2022-04-04 DIAGNOSIS — Z87891 Personal history of nicotine dependence: Secondary | ICD-10-CM | POA: Diagnosis not present

## 2022-04-04 DIAGNOSIS — M797 Fibromyalgia: Secondary | ICD-10-CM | POA: Diagnosis not present

## 2022-04-04 DIAGNOSIS — I110 Hypertensive heart disease with heart failure: Secondary | ICD-10-CM | POA: Diagnosis not present

## 2022-04-04 DIAGNOSIS — T8149XD Infection following a procedure, other surgical site, subsequent encounter: Secondary | ICD-10-CM | POA: Diagnosis not present

## 2022-04-04 DIAGNOSIS — F32A Depression, unspecified: Secondary | ICD-10-CM | POA: Diagnosis not present

## 2022-04-04 DIAGNOSIS — Z7984 Long term (current) use of oral hypoglycemic drugs: Secondary | ICD-10-CM | POA: Diagnosis not present

## 2022-04-04 NOTE — Progress Notes (Signed)
Patient is a 71 year old female with PMH of panniculitis s/p panniculectomy performed 02/05/2022 by Dr. Marla Roe who presents to clinic for postoperative follow-up. ? ?Her postoperative recovery has been complicated by multiple hospitalizations for falls as well as abdominal wall cellulitis.  She had surgical intervention 03/04/2022 by Dr. Marla Roe for excision of her 6 x 15 cm abdominal wound and partial closure of a 6 cm wound.  Myriad powder was placed in addition to wound VAC.  After discharge from the hospital, she was readmitted 03/13/2022 for recurrent wound infection and medication noncompliance.  The wound VAC had been dislodged.  Fortunately she was able to be discharged home a few days later.  ? ?Patient was seen in the ER last night.  I reviewed note, wound was clean.  No evidence of infection.  She was afebrile.  Normal labs.  Discharged home with outpatient follow-up here in clinic. ? ?Today, patient tells me that she has had difficulty with her wound VAC for weeks.  She states that it does not stay on or keep a seal.  She has been having some concerning drainage from her wounds and is afraid that she will get infected again and perhaps even lead to another prolonged hospitalization.  She denies any severe abdominal pain or fevers.  She states that the whole area is numb.  Her HHN comes every other day.  She also feels comfortable performing dressing changes independently. ? ?On physical exam, the majority of her panniculectomy incisions have healed.  However, she does have a 5 x 1 x 2 cm cavitating wound over right side of panniculectomy incision.  There is mild sloughy yellow drainage, but no overt purulence or malodor.  No surrounding cellulitic changes.  2 cm depth.  Granular base. ? ?Given her issues with the wound VAC, will discontinue.  Instead, transition to wet-to-dry dressings.  Packed with a single 4 x 4 gauze here in clinic that had been moistened with saline.  We will call her nurse, Hilda Blades,  to discuss ongoing wound care.  [phone].  Plan is for wet-to-dry dressing changes daily.  Change more frequently if needed.  We will ensure that she gets the appropriate products at home. ? ?Follow-up in 2 weeks, sooner if needed. Picture(s) obtained of the patient and placed in the chart were with the patient's or guardian's permission. ? ?

## 2022-04-05 ENCOUNTER — Encounter (HOSPITAL_COMMUNITY): Payer: Self-pay | Admitting: Emergency Medicine

## 2022-04-05 ENCOUNTER — Emergency Department (HOSPITAL_COMMUNITY)
Admission: EM | Admit: 2022-04-05 | Discharge: 2022-04-05 | Disposition: A | Discharge status: Home / Self Care | Payer: Medicare Other | Attending: Emergency Medicine | Admitting: Emergency Medicine

## 2022-04-05 DIAGNOSIS — T8130XA Disruption of wound, unspecified, initial encounter: Secondary | ICD-10-CM

## 2022-04-05 DIAGNOSIS — X58XXXA Exposure to other specified factors, initial encounter: Secondary | ICD-10-CM | POA: Insufficient documentation

## 2022-04-05 DIAGNOSIS — T8189XA Other complications of procedures, not elsewhere classified, initial encounter: Secondary | ICD-10-CM | POA: Diagnosis not present

## 2022-04-05 DIAGNOSIS — Z91040 Latex allergy status: Secondary | ICD-10-CM | POA: Diagnosis not present

## 2022-04-05 DIAGNOSIS — T8149XA Infection following a procedure, other surgical site, initial encounter: Secondary | ICD-10-CM | POA: Diagnosis not present

## 2022-04-05 LAB — CBC WITH DIFFERENTIAL/PLATELET
Abs Immature Granulocytes: 0.02 10*3/uL (ref 0.00–0.07)
Basophils Absolute: 0.1 10*3/uL (ref 0.0–0.1)
Basophils Relative: 1 %
Eosinophils Absolute: 0.2 10*3/uL (ref 0.0–0.5)
Eosinophils Relative: 3 %
HCT: 33.2 % — ABNORMAL LOW (ref 36.0–46.0)
Hemoglobin: 9.6 g/dL — ABNORMAL LOW (ref 12.0–15.0)
Immature Granulocytes: 0 %
Lymphocytes Relative: 24 %
Lymphs Abs: 1.3 10*3/uL (ref 0.7–4.0)
MCH: 23.8 pg — ABNORMAL LOW (ref 26.0–34.0)
MCHC: 28.9 g/dL — ABNORMAL LOW (ref 30.0–36.0)
MCV: 82.2 fL (ref 80.0–100.0)
Monocytes Absolute: 0.5 10*3/uL (ref 0.1–1.0)
Monocytes Relative: 9 %
Neutro Abs: 3.5 10*3/uL (ref 1.7–7.7)
Neutrophils Relative %: 63 %
Platelets: 360 10*3/uL (ref 150–400)
RBC: 4.04 MIL/uL (ref 3.87–5.11)
RDW: 15.2 % (ref 11.5–15.5)
WBC: 5.6 10*3/uL (ref 4.0–10.5)
nRBC: 0 % (ref 0.0–0.2)

## 2022-04-05 LAB — COMPREHENSIVE METABOLIC PANEL
ALT: 10 U/L (ref 0–44)
AST: 11 U/L — ABNORMAL LOW (ref 15–41)
Albumin: 2.9 g/dL — ABNORMAL LOW (ref 3.5–5.0)
Alkaline Phosphatase: 64 U/L (ref 38–126)
Anion gap: 10 (ref 5–15)
BUN: 15 mg/dL (ref 8–23)
CO2: 27 mmol/L (ref 22–32)
Calcium: 8.9 mg/dL (ref 8.9–10.3)
Chloride: 100 mmol/L (ref 98–111)
Creatinine, Ser: 0.75 mg/dL (ref 0.44–1.00)
GFR, Estimated: >60 mL/min (ref >60)
Glucose, Bld: 142 mg/dL — ABNORMAL HIGH (ref 70–99)
Potassium: 4.6 mmol/L (ref 3.5–5.1)
Sodium: 137 mmol/L (ref 135–145)
Total Bilirubin: 0.3 mg/dL (ref 0.3–1.2)
Total Protein: 7.2 g/dL (ref 6.5–8.1)

## 2022-04-05 LAB — LACTIC ACID, PLASMA: Lactic Acid, Venous: 1.6 mmol/L (ref 0.5–1.9)

## 2022-04-05 NOTE — ED Provider Notes (Signed)
?[place] ?Provider Note ? ? ?CSN: [id number] ?Arrival date & time: 04/05/22  1107 ? ?  ? ?History ? ?Chief Complaint  ?Patient presents with  ? Post-op Problem  ? ? ?[name] [name] is a 71 y.o. female. ? ?HPI ? ?Patient is a 71 year old female history of cosmetic skin removal after weight loss in her lower abdomen who presents to the emergency department due to concern for dehiscence and yellow discharge.  She reports 2 days ago her home nurse noticed that the wound VAC was not sticking well.  At that time they were noticing serosanguineous fluid coming out of it.  Denies any increase in the fluid however concern for wound detachment and noticing some muscle belly.  Patient report this is the fourth time she has been to the emergency department after surgery.  She had complication including MRSA infection and is currently on antibiotic (doxycycline).  She reports surgery was done with Dr. Marla Roe.  She did call their office and informed to come to their office on 5/5.  Patient reports she did not wait because her nurse was concerned.  She denies associated fever or increased in fluid output.  Denies severe abdominal pain.  She reports being compliant with her antibiotic.  Otherwise no other complaint. ? ? ?Home Medications ?Prior to Admission medications   ?Medication Sig Start Date End Date Taking? Authorizing Provider  ?acetaminophen (TYLENOL) 500 MG tablet Take 1,000 mg by mouth every 6 (six) hours as needed for mild pain.    [provider]  ?Biotin w/ Vitamins C & E (HAIR/SKIN/NAILS PO) Take 1 tablet by mouth daily.    [provider]  ?buPROPion (WELLBUTRIN XL) 300 MG 24 hr tablet NEW PRESCRIPTION REQUEST: TAKE ONE TABLET BY MOUTH DAILY 03/28/22   Bacigalupo, Dionne Bucy, MD  ?carvedilol (COREG) 6.25 MG tablet NEW PRESCRIPTION REQUEST: TAKE ONE TABLET BY MOUTH TWICE DAILY 03/28/22   Bacigalupo, Dionne Bucy, MD  ?DULoxetine (CYMBALTA) 60 MG capsule NEW  PRESCRIPTION REQUEST: TAKE ONE CAPSULE BY MOUTH DAILY 03/28/22   Virginia Crews, MD  ?ezetimibe (ZETIA) 10 MG tablet TAKE 1 TABLET BY MOUTH EVERY DAY ?Patient taking differently: Take 10 mg by mouth daily. 12/16/21   Virginia Crews, MD  ?GEMTESA 75 MG TABS Take 1 tablet by mouth daily. 03/22/22   [provider]  ?meloxicam (MOBIC) 15 MG tablet Take 1 tablet (15 mg total) by mouth daily. 04/02/22   Bacigalupo, Dionne Bucy, MD  ?metFORMIN (GLUCOPHAGE) 500 MG tablet NEW PRESCRIPTION REQUEST: TAKE ONE TABLET BY MOUTH TWICE DAILY 03/28/22   Virginia Crews, MD  ?polyethylene glycol (MIRALAX / GLYCOLAX) 17 g packet Take 17 g by mouth daily as needed for mild constipation. 03/06/22   Bonnielee Haff, MD  ?pregabalin (LYRICA) 75 MG capsule Take 1 capsule (75 mg total) by mouth 3 (three) times daily. 04/02/22   Virginia Crews, MD  ?tolterodine (DETROL LA) 4 MG 24 hr capsule Take 4 mg by mouth daily.    [provider]  ?   ? ?Allergies    ?Sulfa antibiotics, Latex, Doxycycline, and Empagliflozin   ? ?Review of Systems   ?Review of Systems ? ?Physical Exam ?Updated Vital Signs ?BP (!) 134/59 (BP Location: Right Arm)   Pulse 71   Temp 98 ?F (36.7 ?C) (Oral)   Resp 17   LMP  (LMP Unknown) Comment: age 16  SpO2 98%  ?Physical Exam ?Vitals and nursing note reviewed.  ?Constitutional:   ?  General: She is not in acute distress. ?   Appearance: She is well-developed.  ?HENT:  ?   Head: Normocephalic and atraumatic.  ?Eyes:  ?   Conjunctiva/sclera: Conjunctivae normal.  ?Cardiovascular:  ?   Rate and Rhythm: Normal rate and regular rhythm.  ?   Heart sounds: No murmur heard. ?Pulmonary:  ?   Effort: Pulmonary effort is normal. No respiratory distress.  ?   Breath sounds: Normal breath sounds.  ?Abdominal:  ?   General: There is distension.  ?   Palpations: Abdomen is soft.  ?   Tenderness: There is no abdominal tenderness.  ?   Comments: 3 cm of wound dehiscence near the right lower abdomen region.   The other surgical area looks clean dry intact.  There is a small amount of yellow discharge from the area.  Area is not severely warm or tender.  It does track few centimeters diagonally and vertically on probe.  ?Musculoskeletal:     ?   General: No swelling.  ?   Cervical back: Neck supple.  ?Skin: ?   General: Skin is warm and dry.  ?   Capillary Refill: Capillary refill takes less than 2 seconds.  ?Neurological:  ?   Mental Status: She is alert.  ?Psychiatric:     ?   Mood and Affect: Mood normal.  ? ? ?ED Results / Procedures / Treatments   ?Labs ?(all labs ordered are listed, but only abnormal results are displayed) ?Labs Reviewed  ?COMPREHENSIVE METABOLIC PANEL - Abnormal; Notable for the following components:  ?    Result Value  ? Glucose, Bld 142 (*)   ? Albumin 2.9 (*)   ? AST 11 (*)   ? All other components within normal limits  ?CBC WITH DIFFERENTIAL/PLATELET - Abnormal; Notable for the following components:  ? Hemoglobin 9.6 (*)   ? HCT 33.2 (*)   ? MCH 23.8 (*)   ? MCHC 28.9 (*)   ? All other components within normal limits  ?AEROBIC CULTURE W GRAM STAIN (SUPERFICIAL SPECIMEN)  ?LACTIC ACID, PLASMA  ?LACTIC ACID, PLASMA  ? ? ?EKG ?None ? ?Radiology ?No results found. ? ?Procedures ?Procedures  ? ? ?Medications Ordered in ED ?Medications - No data to display ? ?ED Course/ Medical Decision Making/ A&P ?  ?                        ?Medical Decision Making ?Problems Addressed: ?Wound dehiscence: acute illness or injury that poses a threat to life or bodily functions ? ?Amount and/or Complexity of Data Reviewed ?Labs: ordered. Decision-making details documented in ED Course. ? ?Patient is a 71 year old female history of cosmetic skin removal after weight loss in her lower abdomen who presents to the emergency department due to concern for dehiscence and yellow discharge.  Patient vital signs within the reference range.  She is not tachycardic or febrile.  Physical exam as above. ? ?Patient presentation is  consistent with wound dehiscence.  Less concern for systemic soft tissue infection at this time.  No acute abdomen on exam.  Patient is not septic.  No systemic signs.  No leukocytosis.  CBC and CMP without acute finding.  Lactic acid is unremarkable.  I have obtained a wound culture.  Per chart review, patient had a MRSA infection and is currently on doxycycline.  Her surgery was done by Dr. Marla Roe at [facility].  I spoke with Dr. Claudia Desanctis and discussed patient's lab findings  and her presentation.  We both agree that patient can follow-up as an outpatient on 5/5.  I have communicated this with patient.  Patient understand and agrees with plan.  Before discharge she continues to remain stable.  Strict return precaution has been discussed. ? ?Final Clinical Impression(s) / ED Diagnoses ?Final diagnoses:  ?Wound dehiscence  ? ? ?Rx / DC Orders ?ED Discharge Orders   ? ? None  ? ?  ? ? ?  ?Donnamarie Poag, MD ?04/05/22 1824 ? ?  ?Lucrezia Starch, MD ?04/05/22 2038 ? ?

## 2022-04-05 NOTE — ED Provider Triage Note (Signed)
Emergency Medicine Provider Triage Evaluation Note ? ?[name] [name] , a 71 y.o. female  was evaluated in triage.  Pt complains of wound problem. ?Surgery on March 6th for skin removal on stomach. She ended up having to come back again to have the surgery repeated multiple times for wound opening. She then had a MRSA infection. Today she noticed that wound vac wouldn't stay on because too much fluid was on it. She had green drainage on her bandage. She is also complaining of worsening pain at the site. No fevers or chills.  ? ?Review of Systems  ?Positive:  ?Negative:  ? ?Physical Exam  ?LMP  (LMP Unknown) Comment: age 46 ?Gen:   Awake, no distress   ?Resp:  Normal effort  ?MSK:   Moves extremities without difficulty  ?Other:  Abdomen with dressing in place. No drainage noted. ? ?Medical Decision Making  ?Medically screening exam initiated at 11:49 AM.  Appropriate orders placed.  [name] [name] was informed that the remainder of the evaluation will be completed by another provider, this initial triage assessment does not replace that evaluation, and the importance of remaining in the ED until their evaluation is complete. ? ? ?  ?Adolphus Birchwood, PA-C ?04/05/22 1152 ? ?

## 2022-04-05 NOTE — ED Notes (Signed)
[facility] left a message for them to call back ?

## 2022-04-05 NOTE — ED Triage Notes (Signed)
Patient here with complaint of increased pain on right abdomen at surgical site, patient at skin removed two months ago. Patient is alert, oriented, afebrile, and in no apparent distress at this time. ?

## 2022-04-05 NOTE — Discharge Instructions (Signed)
You have been evaluated for wound dehiscence.  Your work-up did not show anything acute.  Continue to take your doxycycline.  I have spoke with [facility] who stated that to follow-up with your scheduled appointment on 5/5.  Please return back to the emergency department for worsening symptoms or if you have new onset fever. ?

## 2022-04-05 NOTE — ED Notes (Signed)
DSD applied to abd wound ?

## 2022-04-06 ENCOUNTER — Encounter: Payer: Self-pay | Admitting: Physician Assistant

## 2022-04-06 ENCOUNTER — Ambulatory Visit (INDEPENDENT_AMBULATORY_CARE_PROVIDER_SITE_OTHER): Payer: Medicare Other | Admitting: Physician Assistant

## 2022-04-06 DIAGNOSIS — Z9889 Other specified postprocedural states: Secondary | ICD-10-CM

## 2022-04-06 LAB — AEROBIC CULTURE W GRAM STAIN (SUPERFICIAL SPECIMEN)

## 2022-04-07 ENCOUNTER — Telehealth (HOSPITAL_BASED_OUTPATIENT_CLINIC_OR_DEPARTMENT_OTHER): Payer: Self-pay | Admitting: *Deleted

## 2022-04-07 NOTE — Progress Notes (Signed)
ED Antimicrobial Stewardship Positive Culture Follow Up  ? ?[name] [name] is an 71 y.o. female who presented to [facility] on 04/05/2022 with a chief complaint of  ?Chief Complaint  ?Patient presents with  ? Post-op Problem  ? ? ?Recent Results (from the past 720 hour(s))  ?Culture, blood (Routine x 2)     Status: None  ? Collection Time: 03/13/22 12:59 PM  ? Specimen: BLOOD  ?Result Value Ref Range Status  ? Specimen Description BLOOD SITE NOT SPECIFIED  Final  ? Special Requests   Final  ?  BOTTLES DRAWN AEROBIC AND ANAEROBIC Blood Culture results may not be optimal due to an inadequate volume of blood received in culture bottles  ? Culture   Final  ?  NO GROWTH 5 DAYS ?Performed at [facility], [place] [address], [place], [place] [address] ?  ? Report Status 03/18/2022 FINAL  Final  ?Culture, blood (Routine x 2)     Status: None  ? Collection Time: 03/13/22  1:45 PM  ? Specimen: BLOOD LEFT HAND  ?Result Value Ref Range Status  ? Specimen Description BLOOD LEFT HAND  Final  ? Special Requests   Final  ?  BOTTLES DRAWN AEROBIC AND ANAEROBIC Blood Culture results may not be optimal due to an inadequate volume of blood received in culture bottles  ? Culture   Final  ?  NO GROWTH 5 DAYS ?Performed at [facility], [place] [address], [place], [place] [address] ?  ? Report Status 03/18/2022 FINAL  Final  ?Aerobic Culture w Gram Stain (superficial specimen)     Status: None  ? Collection Time: 04/05/22 11:07 AM  ? Specimen: Abdomen; Wound  ?Result Value Ref Range Status  ? Specimen Description ABDOMEN  Final  ? Special Requests Immunocompromised  Final  ? Gram Stain   Final  ?  RARE WBC PRESENT,BOTH PMN AND MONONUCLEAR ?NO ORGANISMS SEEN ?  ? Culture   Final  ?  FEW PASTEURELLA MULTOCIDA ?Usually susceptible to penicillin and other beta lactam agents,quinolones,macrolides and tetracyclines. ?Performed at [facility], [place] [address] [place], [place] [address] ?  ? Report Status 04/06/2022 FINAL  Final   ? ?Per patient, taking clindamycin currently, not doxycycline given her allergy. F/u with surgery noted on 5/5. Pt states another surgery f/u in a couple of weeks. ? ?Patient states has augmentin prescription that she was told to take following her clindamycin prescription. States that augmentin is to be started tomorrow. D/w with ED PA-C -- augmentin is sufficient coverage for above culture. Informed patient to f/u with surgery to determine if longer abx course is warranted. Patient verbalized understanding. ? ?ED Provider: Jamal Collin PA ? ? ?Lorelei Pont, PharmD, BCPS ?04/07/2022 11:52 AM ?ED Clinical Pharmacist -  [phone] ? ? ?

## 2022-04-09 DIAGNOSIS — Z9181 History of falling: Secondary | ICD-10-CM | POA: Diagnosis not present

## 2022-04-09 DIAGNOSIS — I110 Hypertensive heart disease with heart failure: Secondary | ICD-10-CM | POA: Diagnosis not present

## 2022-04-09 DIAGNOSIS — Z7984 Long term (current) use of oral hypoglycemic drugs: Secondary | ICD-10-CM | POA: Diagnosis not present

## 2022-04-09 DIAGNOSIS — M069 Rheumatoid arthritis, unspecified: Secondary | ICD-10-CM | POA: Diagnosis not present

## 2022-04-09 DIAGNOSIS — Z96652 Presence of left artificial knee joint: Secondary | ICD-10-CM | POA: Diagnosis not present

## 2022-04-09 DIAGNOSIS — I2510 Atherosclerotic heart disease of native coronary artery without angina pectoris: Secondary | ICD-10-CM | POA: Diagnosis not present

## 2022-04-09 DIAGNOSIS — E871 Hypo-osmolality and hyponatremia: Secondary | ICD-10-CM | POA: Diagnosis not present

## 2022-04-09 DIAGNOSIS — M48061 Spinal stenosis, lumbar region without neurogenic claudication: Secondary | ICD-10-CM | POA: Diagnosis not present

## 2022-04-09 DIAGNOSIS — E785 Hyperlipidemia, unspecified: Secondary | ICD-10-CM | POA: Diagnosis not present

## 2022-04-09 DIAGNOSIS — I509 Heart failure, unspecified: Secondary | ICD-10-CM | POA: Diagnosis not present

## 2022-04-09 DIAGNOSIS — M1990 Unspecified osteoarthritis, unspecified site: Secondary | ICD-10-CM | POA: Diagnosis not present

## 2022-04-09 DIAGNOSIS — M797 Fibromyalgia: Secondary | ICD-10-CM | POA: Diagnosis not present

## 2022-04-09 DIAGNOSIS — F32A Depression, unspecified: Secondary | ICD-10-CM | POA: Diagnosis not present

## 2022-04-09 DIAGNOSIS — Z87891 Personal history of nicotine dependence: Secondary | ICD-10-CM | POA: Diagnosis not present

## 2022-04-09 DIAGNOSIS — E119 Type 2 diabetes mellitus without complications: Secondary | ICD-10-CM | POA: Diagnosis not present

## 2022-04-09 DIAGNOSIS — T8149XD Infection following a procedure, other surgical site, subsequent encounter: Secondary | ICD-10-CM | POA: Diagnosis not present

## 2022-04-09 DIAGNOSIS — G8929 Other chronic pain: Secondary | ICD-10-CM | POA: Diagnosis not present

## 2022-04-09 DIAGNOSIS — M545 Low back pain, unspecified: Secondary | ICD-10-CM | POA: Diagnosis not present

## 2022-04-11 ENCOUNTER — Telehealth: Payer: Self-pay | Admitting: Family Medicine

## 2022-04-11 DIAGNOSIS — M797 Fibromyalgia: Secondary | ICD-10-CM | POA: Diagnosis not present

## 2022-04-11 DIAGNOSIS — M1990 Unspecified osteoarthritis, unspecified site: Secondary | ICD-10-CM | POA: Diagnosis not present

## 2022-04-11 DIAGNOSIS — T8149XD Infection following a procedure, other surgical site, subsequent encounter: Secondary | ICD-10-CM | POA: Diagnosis not present

## 2022-04-11 DIAGNOSIS — Z96652 Presence of left artificial knee joint: Secondary | ICD-10-CM | POA: Diagnosis not present

## 2022-04-11 DIAGNOSIS — I2510 Atherosclerotic heart disease of native coronary artery without angina pectoris: Secondary | ICD-10-CM | POA: Diagnosis not present

## 2022-04-11 DIAGNOSIS — I509 Heart failure, unspecified: Secondary | ICD-10-CM | POA: Diagnosis not present

## 2022-04-11 DIAGNOSIS — M48061 Spinal stenosis, lumbar region without neurogenic claudication: Secondary | ICD-10-CM | POA: Diagnosis not present

## 2022-04-11 DIAGNOSIS — M545 Low back pain, unspecified: Secondary | ICD-10-CM | POA: Diagnosis not present

## 2022-04-11 DIAGNOSIS — Z87891 Personal history of nicotine dependence: Secondary | ICD-10-CM | POA: Diagnosis not present

## 2022-04-11 DIAGNOSIS — F32A Depression, unspecified: Secondary | ICD-10-CM | POA: Diagnosis not present

## 2022-04-11 DIAGNOSIS — E119 Type 2 diabetes mellitus without complications: Secondary | ICD-10-CM | POA: Diagnosis not present

## 2022-04-11 DIAGNOSIS — Z9889 Other specified postprocedural states: Secondary | ICD-10-CM

## 2022-04-11 DIAGNOSIS — Z9181 History of falling: Secondary | ICD-10-CM | POA: Diagnosis not present

## 2022-04-11 DIAGNOSIS — Z7984 Long term (current) use of oral hypoglycemic drugs: Secondary | ICD-10-CM | POA: Diagnosis not present

## 2022-04-11 DIAGNOSIS — E871 Hypo-osmolality and hyponatremia: Secondary | ICD-10-CM | POA: Diagnosis not present

## 2022-04-11 DIAGNOSIS — E785 Hyperlipidemia, unspecified: Secondary | ICD-10-CM | POA: Diagnosis not present

## 2022-04-11 DIAGNOSIS — I110 Hypertensive heart disease with heart failure: Secondary | ICD-10-CM | POA: Diagnosis not present

## 2022-04-11 DIAGNOSIS — M069 Rheumatoid arthritis, unspecified: Secondary | ICD-10-CM | POA: Diagnosis not present

## 2022-04-11 DIAGNOSIS — G8929 Other chronic pain: Secondary | ICD-10-CM | POA: Diagnosis not present

## 2022-04-11 NOTE — Telephone Encounter (Signed)
Debra/ Junita Push called to get a referral for wound care / please advise  ? ?She also wanted to give PCP and update / even though she was just seen by the surgery team they sent her home and said it was fine / they advise that treatment was not complicated / but the RN that looked at the pt today said it has tunneling on the left and right side as well as straight up / about 10.5cm / please advise about wound care referral asap  ? ?Please call Hilda Blades to confirm the info was received  ?

## 2022-04-12 ENCOUNTER — Telehealth: Payer: Self-pay

## 2022-04-12 ENCOUNTER — Telehealth: Payer: Self-pay | Admitting: Family Medicine

## 2022-04-12 NOTE — Telephone Encounter (Signed)
[name] [name] advised of approved verbal order. ?

## 2022-04-12 NOTE — Telephone Encounter (Signed)
OK for verbals 

## 2022-04-12 NOTE — Telephone Encounter (Signed)
Retrieved a voicemail from [employer], Therapist, sports from [place] to obtain verbal orders regarding new tunneling that she found yesterday.  ? ?Spoke to [employer] and she reports 6 cm at 1 o'clock, 10.5 cm at 3 o'clock, 5 cm at 10 o'clock after cautiously probing with a Q-tip. She states she packed the wound with iodoform, moist guaze and covered it with an abd pad. She reports patient called her this morning and reported that when she tried to remove the guaze to apply more, the iodoform came off with it, it was more gunky and experienced new pain. [name] [name] is aware that pt has appt tomorrow. PCP has submitted referral to wound care.  ? ?Spoke to [place] as he confirmed he saw pt last Friday and it was not infected; adv of wet to dry dressings until patient's appt with Dr. Marla [name]. He also adv that pt continue with the iodoform, moist guaze and abd pad dressings; if pt doesn't have fever, n/v or other Sx that would indicate a possible infection, pt can wait to see Dr. Marla [name] tomorrow.  ? ?Called [name] [name] back and adv her of [name] [name]'s recommendations. She stated that pt has the necessary supplies there but she cannot pack the iodoform herself. She confirmed that pt has applied moist guaze and abd pad to wound and will need to figure out how to get the iodoform packed in the wound as her day to go out to pt was yesterday, not today. She adv that at the visit tomorrow, the tunneling should be addressed because if they close up the pt could develop abscesses. Adv that I would send the note to Dr. Marla [name].  ?

## 2022-04-12 NOTE — Telephone Encounter (Signed)
Home Health Verbal Orders - Caller/Agency: Neoma Laming RN with [facility] ?Callback Number: [phone] ?Requesting OT/PT/Skilled Nursing/Social Work/Speech Therapy: Requesting Social Work Consult  ?Frequency:  ? ?Requesting Social Worker consult  ? ?

## 2022-04-12 NOTE — Telephone Encounter (Signed)
Please place urgent referral to wound care - use post-op wound diagnosis that was used previously ?

## 2022-04-12 NOTE — Telephone Encounter (Signed)
Angela Nevin, RN from [facility] left voicemail regarding wound update. I called Angela Nevin back but was unavailable; spoke to [place] and she stated that they received a report from the home health nurse reporting muscle and fat coming out of the wound. She wanted to f/u to see if the reason was due to wound etiology or their wound vac.  ? ?Adv pt of previous conversation with home health nurse and Garret's (PA) response (04/12/22 telephone note). Also referenced Garret's 5/5 ov note. Roselyn Reef conveyed understanding and that she would relay this information to Shelly.  ?

## 2022-04-13 ENCOUNTER — Encounter: Payer: Self-pay | Admitting: Student

## 2022-04-13 ENCOUNTER — Telehealth: Payer: Self-pay | Admitting: Family Medicine

## 2022-04-13 ENCOUNTER — Ambulatory Visit (INDEPENDENT_AMBULATORY_CARE_PROVIDER_SITE_OTHER): Payer: Medicare Other | Admitting: Student

## 2022-04-13 VITALS — BP 147/78 | HR 83

## 2022-04-13 DIAGNOSIS — I509 Heart failure, unspecified: Secondary | ICD-10-CM | POA: Diagnosis not present

## 2022-04-13 DIAGNOSIS — E871 Hypo-osmolality and hyponatremia: Secondary | ICD-10-CM | POA: Diagnosis not present

## 2022-04-13 DIAGNOSIS — G8929 Other chronic pain: Secondary | ICD-10-CM | POA: Diagnosis not present

## 2022-04-13 DIAGNOSIS — Z96652 Presence of left artificial knee joint: Secondary | ICD-10-CM | POA: Diagnosis not present

## 2022-04-13 DIAGNOSIS — T8149XD Infection following a procedure, other surgical site, subsequent encounter: Secondary | ICD-10-CM | POA: Diagnosis not present

## 2022-04-13 DIAGNOSIS — Z87891 Personal history of nicotine dependence: Secondary | ICD-10-CM | POA: Diagnosis not present

## 2022-04-13 DIAGNOSIS — M1990 Unspecified osteoarthritis, unspecified site: Secondary | ICD-10-CM | POA: Diagnosis not present

## 2022-04-13 DIAGNOSIS — M545 Low back pain, unspecified: Secondary | ICD-10-CM | POA: Diagnosis not present

## 2022-04-13 DIAGNOSIS — M797 Fibromyalgia: Secondary | ICD-10-CM | POA: Diagnosis not present

## 2022-04-13 DIAGNOSIS — M069 Rheumatoid arthritis, unspecified: Secondary | ICD-10-CM | POA: Diagnosis not present

## 2022-04-13 DIAGNOSIS — Z7984 Long term (current) use of oral hypoglycemic drugs: Secondary | ICD-10-CM | POA: Diagnosis not present

## 2022-04-13 DIAGNOSIS — E785 Hyperlipidemia, unspecified: Secondary | ICD-10-CM | POA: Diagnosis not present

## 2022-04-13 DIAGNOSIS — I110 Hypertensive heart disease with heart failure: Secondary | ICD-10-CM | POA: Diagnosis not present

## 2022-04-13 DIAGNOSIS — Z9889 Other specified postprocedural states: Secondary | ICD-10-CM

## 2022-04-13 DIAGNOSIS — M48061 Spinal stenosis, lumbar region without neurogenic claudication: Secondary | ICD-10-CM | POA: Diagnosis not present

## 2022-04-13 DIAGNOSIS — I2510 Atherosclerotic heart disease of native coronary artery without angina pectoris: Secondary | ICD-10-CM | POA: Diagnosis not present

## 2022-04-13 DIAGNOSIS — Z9181 History of falling: Secondary | ICD-10-CM | POA: Diagnosis not present

## 2022-04-13 DIAGNOSIS — F32A Depression, unspecified: Secondary | ICD-10-CM | POA: Diagnosis not present

## 2022-04-13 DIAGNOSIS — E119 Type 2 diabetes mellitus without complications: Secondary | ICD-10-CM | POA: Diagnosis not present

## 2022-04-13 NOTE — Telephone Encounter (Signed)
OK for verbals 

## 2022-04-13 NOTE — Telephone Encounter (Signed)
Home Health Verbal Orders - Caller/Agency: Colletta Maryland OT [facility] ?Callback Number: [phone] ?Requesting OT/PT/Skilled Nursing/Social Work/Speech Therapy: OT  ?Frequency:  ? ?1w1 ?2w2 ? ?

## 2022-04-13 NOTE — Telephone Encounter (Signed)
[name] [name] advised of approved verbal orders.  ?

## 2022-04-13 NOTE — Progress Notes (Signed)
Patient is a 70 year old female who is status post panniculectomy performed by [name] [name] on 02/05/2022.  Since surgery, patient has had several hospitalizations and postoperative complications including abdominal wall cellulitis, medication noncompliance and wound dehiscence.   ? ?Upon one of her hospitalizations, patient underwent irrigation and debridement of her abdomen with myriad and wound VAC placement with [name] [name] on 03/04/2022. Patient was sent home from the hospital and and had issues with her wound VAC at home. ? ?Patient was seen in the clinic 1 week ago.  At this visit she complained that she was having issues keeping her wound VAC on and keeping a seal.  She was found to have a 5 x 1 x 2 cm cavitating wound over the right side of the panniculectomy incision.  No signs of infection were found at this visit.  Patient's wound VAC was discontinued and she was instructed to pack the wound with wet-to-dry gauze dressings daily. ? ?Yesterday, patient's home health nurse, [name] [name], called with concern about the wound and that she found new tunneling in the wound.  [name] [name] stated that she packed the wound with iodoform, moist gauze and covered with an ABD pad.  She felt that when she tried to remove the gauze to apply more, the iodoform came off with it and found the iodoform to be more "gunky" and the patient was experiencing a new pain. ? ?Today patient reports she is doing okay.  She states that her gauze packing has been getting changed daily by either herself or her wound care nurse.  Patient reports that wound care nurse started packing her wound with iodoform packing in addition to the moist gauze on top on Wednesday.  She reports this morning when she change the gauze she saw some "gunk" on the gauze.  She reports there has been some drainage but it has been yellowish-pink.  She denies any yellow/green drainage.  Patient also reports that tape has been irritating her skin a little bit.  Patient  denies any fevers or chills.  She reports that sometimes she has a little pain if she bumps her abdomen near the wound. ? ?Upon exam, patient is sitting upright in no acute distress.  Her vitals are stable.  Her panniculectomy incision is healed for the exception of the cavitating 5 x 1 x 2 cm wound just lateral to the right of the midline of the incision.  There is no surrounding erythema around the wound.  There is no malodor.  There is a minimal amount of fibrous tissue noted within the wound.  There are a few small abrasions on the abdomen from the tape she has been using. ? ?We changed the dressing in the clinic today.  A small amount of myriad was placed in the wound with Adaptic, K-Y jelly, and gauze.  Instructed patient to just change the K-Y jelly and gauze daily for now.  Instructed patient to have her wound care nurse call Joss RN here at the clinic on Monday to discuss what wound care products can be available for the patient and wound care instructions.  [name] [name]'s name and number were given to the patient.  Also instructed patient to place small amount of Vaseline on abrasions. ? ?Discussed with patient to call the clinic if she has any concerns or questions.  Also discussed that if she is experiencing fevers she should call us or go to the emergency room.  Patient acknowledged. ? ?Patient to follow-up in the clinic in 3   weeks. ? ?Patient seen and examined with [name] ? ?

## 2022-04-14 ENCOUNTER — Other Ambulatory Visit: Payer: Self-pay | Admitting: Family Medicine

## 2022-04-16 ENCOUNTER — Telehealth: Payer: Self-pay

## 2022-04-16 DIAGNOSIS — M545 Low back pain, unspecified: Secondary | ICD-10-CM | POA: Diagnosis not present

## 2022-04-16 DIAGNOSIS — M1990 Unspecified osteoarthritis, unspecified site: Secondary | ICD-10-CM | POA: Diagnosis not present

## 2022-04-16 DIAGNOSIS — Z9181 History of falling: Secondary | ICD-10-CM | POA: Diagnosis not present

## 2022-04-16 DIAGNOSIS — M48061 Spinal stenosis, lumbar region without neurogenic claudication: Secondary | ICD-10-CM | POA: Diagnosis not present

## 2022-04-16 DIAGNOSIS — F32A Depression, unspecified: Secondary | ICD-10-CM | POA: Diagnosis not present

## 2022-04-16 DIAGNOSIS — E119 Type 2 diabetes mellitus without complications: Secondary | ICD-10-CM | POA: Diagnosis not present

## 2022-04-16 DIAGNOSIS — Z96652 Presence of left artificial knee joint: Secondary | ICD-10-CM | POA: Diagnosis not present

## 2022-04-16 DIAGNOSIS — T8149XD Infection following a procedure, other surgical site, subsequent encounter: Secondary | ICD-10-CM | POA: Diagnosis not present

## 2022-04-16 DIAGNOSIS — I509 Heart failure, unspecified: Secondary | ICD-10-CM | POA: Diagnosis not present

## 2022-04-16 DIAGNOSIS — G8929 Other chronic pain: Secondary | ICD-10-CM | POA: Diagnosis not present

## 2022-04-16 DIAGNOSIS — E785 Hyperlipidemia, unspecified: Secondary | ICD-10-CM | POA: Diagnosis not present

## 2022-04-16 DIAGNOSIS — I110 Hypertensive heart disease with heart failure: Secondary | ICD-10-CM | POA: Diagnosis not present

## 2022-04-16 DIAGNOSIS — Z87891 Personal history of nicotine dependence: Secondary | ICD-10-CM | POA: Diagnosis not present

## 2022-04-16 DIAGNOSIS — Z7984 Long term (current) use of oral hypoglycemic drugs: Secondary | ICD-10-CM | POA: Diagnosis not present

## 2022-04-16 DIAGNOSIS — E871 Hypo-osmolality and hyponatremia: Secondary | ICD-10-CM | POA: Diagnosis not present

## 2022-04-16 DIAGNOSIS — I2510 Atherosclerotic heart disease of native coronary artery without angina pectoris: Secondary | ICD-10-CM | POA: Diagnosis not present

## 2022-04-16 DIAGNOSIS — M069 Rheumatoid arthritis, unspecified: Secondary | ICD-10-CM | POA: Diagnosis not present

## 2022-04-16 DIAGNOSIS — M797 Fibromyalgia: Secondary | ICD-10-CM | POA: Diagnosis not present

## 2022-04-16 NOTE — Telephone Encounter (Signed)
Looks like this was stopped at d/c from the hospital. Can we find out if she is taking it or not. Ok to refill if she is taaking it. ?

## 2022-04-16 NOTE — Telephone Encounter (Signed)
[name] [name], pt's [facility] health nurse left voicemail  regarding abscess concerns below incision, measuring about 13 cm; reports visually bolding and is starting to progress down her L leg groin.  ? ?Please advise.  ?

## 2022-04-17 DIAGNOSIS — I509 Heart failure, unspecified: Secondary | ICD-10-CM | POA: Diagnosis not present

## 2022-04-17 DIAGNOSIS — M797 Fibromyalgia: Secondary | ICD-10-CM | POA: Diagnosis not present

## 2022-04-17 DIAGNOSIS — I110 Hypertensive heart disease with heart failure: Secondary | ICD-10-CM | POA: Diagnosis not present

## 2022-04-17 DIAGNOSIS — M1990 Unspecified osteoarthritis, unspecified site: Secondary | ICD-10-CM | POA: Diagnosis not present

## 2022-04-17 DIAGNOSIS — M545 Low back pain, unspecified: Secondary | ICD-10-CM | POA: Diagnosis not present

## 2022-04-17 DIAGNOSIS — E871 Hypo-osmolality and hyponatremia: Secondary | ICD-10-CM | POA: Diagnosis not present

## 2022-04-17 DIAGNOSIS — Z87891 Personal history of nicotine dependence: Secondary | ICD-10-CM | POA: Diagnosis not present

## 2022-04-17 DIAGNOSIS — M48061 Spinal stenosis, lumbar region without neurogenic claudication: Secondary | ICD-10-CM | POA: Diagnosis not present

## 2022-04-17 DIAGNOSIS — T8149XD Infection following a procedure, other surgical site, subsequent encounter: Secondary | ICD-10-CM | POA: Diagnosis not present

## 2022-04-17 DIAGNOSIS — I2510 Atherosclerotic heart disease of native coronary artery without angina pectoris: Secondary | ICD-10-CM | POA: Diagnosis not present

## 2022-04-17 DIAGNOSIS — G8929 Other chronic pain: Secondary | ICD-10-CM | POA: Diagnosis not present

## 2022-04-17 DIAGNOSIS — M069 Rheumatoid arthritis, unspecified: Secondary | ICD-10-CM | POA: Diagnosis not present

## 2022-04-17 DIAGNOSIS — Z7984 Long term (current) use of oral hypoglycemic drugs: Secondary | ICD-10-CM | POA: Diagnosis not present

## 2022-04-17 DIAGNOSIS — T8189XA Other complications of procedures, not elsewhere classified, initial encounter: Secondary | ICD-10-CM | POA: Diagnosis not present

## 2022-04-17 DIAGNOSIS — E119 Type 2 diabetes mellitus without complications: Secondary | ICD-10-CM | POA: Diagnosis not present

## 2022-04-17 DIAGNOSIS — Z9181 History of falling: Secondary | ICD-10-CM | POA: Diagnosis not present

## 2022-04-17 DIAGNOSIS — Z96652 Presence of left artificial knee joint: Secondary | ICD-10-CM | POA: Diagnosis not present

## 2022-04-17 DIAGNOSIS — F32A Depression, unspecified: Secondary | ICD-10-CM | POA: Diagnosis not present

## 2022-04-17 DIAGNOSIS — E785 Hyperlipidemia, unspecified: Secondary | ICD-10-CM | POA: Diagnosis not present

## 2022-04-17 MED ORDER — LOSARTAN POTASSIUM 25 MG PO TABS: 25.0000 mg | ORAL_TABLET | Freq: Every day | ORAL | 0 refills | Status: DC

## 2022-04-17 NOTE — Telephone Encounter (Signed)
Thank you for making for the message. I just spoke with Dr. Marla Roe and after seeing the patient on Friday patient can just come in for her next scheduled appointment

## 2022-04-17 NOTE — Progress Notes (Signed)
Annual Wellness Visit     Patient: [name] [name], Female    DOB: [birth year], 71 y.o.   MRN: [id number] Visit Date: 04/19/2022  Today's Provider: Lavon Paganini, MD   Chief Complaint  Patient presents with   Diabetes   Hypertension   Hyperlipidemia   Annual Exam   Subjective    [name] [name] is a 71 y.o. female who presents today for her Annual Wellness Visit. She reports consuming a general diet. The patient does not participate in regular exercise at present. She generally feels poorly. She reports sleeping well. She does have additional problems to discuss today.   Diabetes Hypoglycemia symptoms include headaches.  Hypertension Associated symptoms include headaches.  Hyperlipidemia     Medications: Outpatient Medications Prior to Visit  Medication Sig   acetaminophen (TYLENOL) 500 MG tablet Take 1,000 mg by mouth every 6 (six) hours as needed for mild pain.   Biotin w/ Vitamins C & E (HAIR/SKIN/NAILS PO) Take 1 tablet by mouth daily.   buPROPion (WELLBUTRIN XL) 300 MG 24 hr tablet NEW PRESCRIPTION REQUEST: TAKE ONE TABLET BY MOUTH DAILY   carvedilol (COREG) 6.25 MG tablet NEW PRESCRIPTION REQUEST: TAKE ONE TABLET BY MOUTH TWICE DAILY   DULoxetine (CYMBALTA) 60 MG capsule NEW PRESCRIPTION REQUEST: TAKE ONE CAPSULE BY MOUTH DAILY   ezetimibe (ZETIA) 10 MG tablet TAKE 1 TABLET BY MOUTH EVERY DAY (Patient taking differently: Take 10 mg by mouth daily.)   GEMTESA 75 MG TABS Take 1 tablet by mouth daily.   losartan (COZAAR) 25 MG tablet Take 1 tablet (25 mg total) by mouth daily.   meloxicam (MOBIC) 15 MG tablet Take 1 tablet (15 mg total) by mouth daily.   metFORMIN (GLUCOPHAGE) 500 MG tablet NEW PRESCRIPTION REQUEST: TAKE ONE TABLET BY MOUTH TWICE DAILY   polyethylene glycol (MIRALAX / GLYCOLAX) 17 g packet Take 17 g by mouth daily as needed for mild constipation.   pregabalin (LYRICA) 75 MG capsule Take 1 capsule (75 mg total) by mouth 3 (three) times daily.    tolterodine (DETROL LA) 4 MG 24 hr capsule Take 4 mg by mouth daily.   No facility-administered medications prior to visit.    Allergies  Allergen Reactions   Sulfa Antibiotics Itching, Rash and Swelling   Latex Itching   Doxycycline Rash   Empagliflozin Itching and Other (See Comments)    Itching and frequent yeast infections    Patient Care Team: Virginia Crews, MD as PCP - General (Family Medicine) Bjorn Loser, MD as Consulting Physician (Urology) Berneta Sages, [place] (Optometry) Dilling[place], Loel Lofty, DO as Attending Physician (Plastic Surgery) Vern Claude, LCSW as Social Worker Corey Skains, MD as Consulting Physician (Cardiology) Corey Skains, MD as Consulting Physician (Cardiology)  Review of Systems  Constitutional:  Positive for activity change.  HENT:  Positive for rhinorrhea and sinus pain.   Eyes:  Positive for itching.  Gastrointestinal:  Positive for constipation.  Genitourinary:  Positive for frequency and urgency.  Musculoskeletal:  Positive for arthralgias and back pain.  Skin:  Positive for wound.  Neurological:  Positive for headaches.  Psychiatric/Behavioral:  Positive for sleep disturbance.    Last CBC Lab Results  Component Value Date   WBC 5.6 04/05/2022   HGB 9.6 (L) 04/05/2022   HCT 33.2 (L) 04/05/2022   MCV 82.2 04/05/2022   MCH 23.8 (L) 04/05/2022   RDW 15.2 04/05/2022   PLT 360 26/94/8546   Last metabolic panel Lab Results  Component Value Date   GLUCOSE 142 (H) 04/05/2022   NA 137 04/05/2022   K 4.6 04/05/2022   CL 100 04/05/2022   CO2 27 04/05/2022   BUN 15 04/05/2022   CREATININE 0.75 04/05/2022   GFRNONAA >60 04/05/2022   CALCIUM 8.9 04/05/2022   PHOS 3.1 02/21/2022   PROT 7.2 04/05/2022   ALBUMIN 2.9 (L) 04/05/2022   LABGLOB 3.0 10/24/2021   AGRATIO 1.2 10/24/2021   BILITOT 0.3 04/05/2022   ALKPHOS 64 04/05/2022   AST 11 (L) 04/05/2022   ALT 10 04/05/2022   ANIONGAP 10 04/05/2022   Last  lipids Lab Results  Component Value Date   CHOL 221 (H) 10/24/2021   HDL 50 10/24/2021   LDLCALC 122 (H) 10/24/2021   LDLDIRECT 183.0 12/13/2016   TRIG 279 (H) 10/24/2021   CHOLHDL 4.4 10/24/2021   Last hemoglobin A1c Lab Results  Component Value Date   HGBA1C 7.4 (H) 03/03/2022   Last thyroid functions Lab Results  Component Value Date   TSH 2.160 07/18/2018   Last vitamin D Lab Results  Component Value Date   VD25OH 33.0 02/03/2020   Last vitamin B12 and Folate Lab Results  Component Value Date   VITAMINB12 234 03/04/2022   FOLATE 10.8 03/04/2022        Objective    Vitals: BP 130/85 Comment: home reading  Pulse 70   Temp 98 F (36.7 C) (Oral)   Resp 16   Ht '5\' 7"'$  (1.702 m)   Wt 234 lb 1.6 oz (106.2 kg)   LMP  (LMP Unknown) Comment: age 76  SpO2 96%   BMI 36.67 kg/m  BP Readings from Last 3 Encounters:  04/19/22 130/85  04/13/22 (!) 147/78  04/05/22 (!) 134/59   Wt Readings from Last 3 Encounters:  04/19/22 234 lb 1.6 oz (106.2 kg)  04/02/22 231 lb (104.8 kg)  03/16/22 234 lb 12.6 oz (106.5 kg)       Physical Exam Vitals reviewed.  Constitutional:      General: She is not in acute distress.    Appearance: Normal appearance. She is well-developed. She is not diaphoretic.  HENT:     Head: Normocephalic and atraumatic.  Eyes:     General: No scleral icterus.    Conjunctiva/sclera: Conjunctivae normal.  Neck:     Thyroid: No thyromegaly.  Cardiovascular:     Rate and Rhythm: Normal rate and regular rhythm.     Pulses: Normal pulses.     Heart sounds: Normal heart sounds. No murmur heard. Pulmonary:     Effort: Pulmonary effort is normal. No respiratory distress.     Breath sounds: Normal breath sounds. No wheezing, rhonchi or rales.  Musculoskeletal:     Cervical back: Neck supple.     Right lower leg: No edema.     Left lower leg: No edema.  Lymphadenopathy:     Cervical: No cervical adenopathy.  Skin:    General: Skin is warm and  dry.     Findings: No rash.  Neurological:     Mental Status: She is alert and oriented to person, place, and time. Mental status is at baseline.  Psychiatric:        Mood and Affect: Mood is depressed. Affect is flat.        Behavior: Behavior normal.     Diabetic Foot Exam - Simple   Simple Foot Form Diabetic Foot exam was performed with the following findings: Yes 04/19/2022 11:41 AM  Visual Inspection  No deformities, no ulcerations, no other skin breakdown bilaterally: Yes Sensation Testing Intact to touch and monofilament testing bilaterally: Yes Pulse Check Posterior Tibialis and Dorsalis pulse intact bilaterally: Yes Comments      Most recent functional status assessment:    04/19/2022   11:12 AM  In your present state of health, do you have any difficulty performing the following activities:  Hearing? 0  Vision? 0  Difficulty concentrating or making decisions? 0  Walking or climbing stairs? 0  Dressing or bathing? 0  Doing errands, shopping? 0   Most recent fall risk assessment:    04/19/2022   11:11 AM  Fall Risk   Falls in the past year? 1  Number falls in past yr: 1  Injury with Fall? 0  Risk for fall due to : History of fall(s)  Follow up Falls evaluation completed;Education provided;Falls prevention discussed    Most recent depression screenings:    04/19/2022   11:11 AM 04/02/2022   11:01 AM  PHQ 2/9 Scores  PHQ - 2 Score 6 2  PHQ- 9 Score 15 5   Most recent cognitive screening:    04/19/2022   11:13 AM  6CIT Screen  What Year? 0 points  What month? 0 points  What time? 0 points  Count back from 20 0 points  Months in reverse 0 points  Repeat phrase 4 points  Total Score 4 points   Most recent Audit-C alcohol use screening    04/02/2022   11:02 AM  Alcohol Use Disorder Test (AUDIT)  1. How often do you have a drink containing alcohol? 0  2. How many drinks containing alcohol do you have on a typical day when you are drinking? 0  3. How  often do you have six or more drinks on one occasion? 0  AUDIT-C Score 0   A score of 3 or more in women, and 4 or more in men indicates increased risk for alcohol abuse, EXCEPT if all of the points are from question 1   No results found for any visits on 04/19/22.  Assessment & Plan     Annual wellness visit done today including the all of the following: Reviewed patient's Family Medical History Reviewed and updated list of patient's medical providers Assessment of cognitive impairment was done Assessed patient's functional ability Established a written schedule for health screening [place] Completed and Reviewed  Exercise Activities and Dietary recommendations  Goals      Prevent falls     Recommend to remove any items from the home that may cause slips or trips.        Immunization History  Administered Date(s) Administered   Influenza,inj,Quad PF,6+ Mos 09/06/2015, 12/13/2016   Pneumococcal Polysaccharide-23 12/13/2016    Health Maintenance  Topic Date Due   COVID-19 Vaccine (1) Never done   TETANUS/TDAP  Never done   Zoster Vaccines- Shingrix (1 of 2) Never done   COLONOSCOPY (Pts 45-37yr Insurance coverage will need to be confirmed)  Never done   MAMMOGRAM  Never done   DEXA SCAN  Never done   OPHTHALMOLOGY EXAM  01/24/2022   Pneumonia Vaccine 71 Years old (2 - PCV) 01/18/2023 (Originally 12/13/2017)   INFLUENZA VACCINE  07/03/2022   HEMOGLOBIN A1C  09/02/2022   FOOT EXAM  04/20/2023   Hepatitis C Screening  Completed   HPV VACCINES  Aged Out     Discussed health benefits of physical activity, and encouraged her to engage in regular  exercise appropriate for her age and condition.    Problem List Items Addressed This Visit       Cardiovascular and Mediastinum   Hypertension associated with diabetes ([place])    Well controlled on home readings with HH RN Continue current medications Recheck metabolic panel      Senile purpura  ([place])    Chronic and stable Continue to monitor         Endocrine   Type 2 diabetes mellitus with complication, without long-term current use of insulin (HCC)    Reviewed recent A1c - near goal  No changes today Will continue current meds       Relevant Orders   Microalbumin / creatinine urine ratio   Hyperlipidemia associated with type 2 diabetes mellitus (HCC)    Previously uncontrolled Continue statin Repeat FLP and CMP Goal LDL < 70      Relevant Orders   Lipid Panel With LDL/HDL Ratio     Nervous and Auditory   Polyneuropathy associated with underlying disease ([place])    Chronic and well controlled Continue lyrica at current dose         Musculoskeletal and Integument   Rheumatoid arthritis involving multiple sites with positive rheumatoid factor ([place])    F/b rheum Chronic and stable         Other   MDD (major depressive disorder)    Chronic and uncontrolled Grief from end of relationship with fiance, various past traumas and losses Received call from home health RN yesterday reporting passive SI Patient had safety check from Police Department She states that she is safe to herself currently and thinks of her daughter and grandchildren as a reason to not have active SI at this time No active plan Continue Wellbutrin and Cymbalta at current doses, but is interested in seeing psychiatry-referral placed today Gave options for therapists in the area Contracted for safety       Relevant Orders   Ambulatory referral to Psychiatry   Obesity    Discussed importance of healthy weight management Discussed diet and exercise       Other Visit Diagnoses     Encounter for annual wellness visit (AWV) in Medicare patient    -  Primary   Encounter for annual physical exam       Relevant Orders   Lipid Panel With LDL/HDL Ratio   Microalbumin / creatinine urine ratio   Postmenopausal       Relevant Orders   DG Bone Density        Return in about 3 months  (around 07/20/2022) for chronic disease f/u.     I, Lavon Paganini, MD, have reviewed all documentation for this visit. The documentation on 04/19/22 for the exam, diagnosis, procedures, and orders are all accurate and complete.   Benigna Delisi, Dionne Bucy, MD, MPH [name]

## 2022-04-18 ENCOUNTER — Ambulatory Visit: Payer: Self-pay | Admitting: *Deleted

## 2022-04-18 ENCOUNTER — Other Ambulatory Visit: Payer: Self-pay | Admitting: Family Medicine

## 2022-04-18 DIAGNOSIS — I509 Heart failure, unspecified: Secondary | ICD-10-CM | POA: Diagnosis not present

## 2022-04-18 DIAGNOSIS — M069 Rheumatoid arthritis, unspecified: Secondary | ICD-10-CM | POA: Diagnosis not present

## 2022-04-18 DIAGNOSIS — M1990 Unspecified osteoarthritis, unspecified site: Secondary | ICD-10-CM | POA: Diagnosis not present

## 2022-04-18 DIAGNOSIS — F32A Depression, unspecified: Secondary | ICD-10-CM | POA: Diagnosis not present

## 2022-04-18 DIAGNOSIS — Z87891 Personal history of nicotine dependence: Secondary | ICD-10-CM | POA: Diagnosis not present

## 2022-04-18 DIAGNOSIS — M545 Low back pain, unspecified: Secondary | ICD-10-CM | POA: Diagnosis not present

## 2022-04-18 DIAGNOSIS — I110 Hypertensive heart disease with heart failure: Secondary | ICD-10-CM | POA: Diagnosis not present

## 2022-04-18 DIAGNOSIS — G8929 Other chronic pain: Secondary | ICD-10-CM | POA: Diagnosis not present

## 2022-04-18 DIAGNOSIS — Z96652 Presence of left artificial knee joint: Secondary | ICD-10-CM | POA: Diagnosis not present

## 2022-04-18 DIAGNOSIS — E119 Type 2 diabetes mellitus without complications: Secondary | ICD-10-CM | POA: Diagnosis not present

## 2022-04-18 DIAGNOSIS — M48061 Spinal stenosis, lumbar region without neurogenic claudication: Secondary | ICD-10-CM | POA: Diagnosis not present

## 2022-04-18 DIAGNOSIS — E785 Hyperlipidemia, unspecified: Secondary | ICD-10-CM | POA: Diagnosis not present

## 2022-04-18 DIAGNOSIS — Z7984 Long term (current) use of oral hypoglycemic drugs: Secondary | ICD-10-CM | POA: Diagnosis not present

## 2022-04-18 DIAGNOSIS — Z9181 History of falling: Secondary | ICD-10-CM | POA: Diagnosis not present

## 2022-04-18 DIAGNOSIS — I2510 Atherosclerotic heart disease of native coronary artery without angina pectoris: Secondary | ICD-10-CM | POA: Diagnosis not present

## 2022-04-18 DIAGNOSIS — T8149XD Infection following a procedure, other surgical site, subsequent encounter: Secondary | ICD-10-CM | POA: Diagnosis not present

## 2022-04-18 DIAGNOSIS — M797 Fibromyalgia: Secondary | ICD-10-CM | POA: Diagnosis not present

## 2022-04-18 DIAGNOSIS — E871 Hypo-osmolality and hyponatremia: Secondary | ICD-10-CM | POA: Diagnosis not present

## 2022-04-18 NOTE — Telephone Encounter (Signed)
Medication Refill - Medication:  ?pregabalin (LYRICA) 75 MG capsule 90 capsule 1 04/02/2022    ?Sig - Route: Take 1 capsule (75 mg total) by mouth 3 (three) times daily. -    ? ?The [facility] is calling requesting a new script written and sent to them as it is new and cannot accept transfer. Pharmacy states pt is not able to get out and pu meds and wants delivered, did not get refill 5/1 as noone to get for her.  ?Has the patient contacted their pharmacy? Yes.   ?(Agent: If no, request that the patient contact the pharmacy for the refill. If patient does not wish to contact the pharmacy document the reason why and proceed with request.) ?(Agent: If yes, when and what did the pharmacy advise?) will call on pt behalf ? ?Preferred Pharmacy (with phone number or street name):  ?[facility] - [address] [place], [place] - [address] Phone:  [phone]  ?Fax:  [phone]  ?  ? ?Has the patient been seen for an appointment in the last year OR does the patient have an upcoming appointment? Yes.   ? ?Agent: Please be advised that RX refills may take up to 3 business days. We ask that you follow-up with your pharmacy. ? ?

## 2022-04-18 NOTE — Telephone Encounter (Signed)
Requested medications are due for refill today.  no ? ?Requested medications are on the active medications list.  yes ? ?Last refill. 04/02/2022 #90 1 refill ? ?Future visit scheduled.   no ? ?Notes to clinic.  Medication refill is not delegated. ? ? ? ?Requested Prescriptions  ?Pending Prescriptions Disp Refills  ? pregabalin (LYRICA) 75 MG capsule 90 capsule 1  ?  Sig: Take 1 capsule (75 mg total) by mouth 3 (three) times daily.  ?  ? Not Delegated - Neurology:  Anticonvulsants - Controlled - pregabalin Failed - 04/18/2022 12:45 PM  ?  ?  Failed - This refill cannot be delegated  ?  ?  Passed - Cr in normal range and within 360 days  ?  Creatinine  ?Date Value Ref Range Status  ?08/31/2014 0.63 0.60 - 1.30 mg/dL Final  ? ?Creatinine, Ser  ?Date Value Ref Range Status  ?04/05/2022 0.75 0.44 - 1.00 mg/dL Final  ?   ?  ?  Passed - Completed PHQ-2 or PHQ-9 in the last 360 days  ?  ?  Passed - Valid encounter within last 12 months  ?  Recent Outpatient Visits   ? ?      ? 2 weeks ago Adhesive capsulitis of both shoulders  ? [facility] [place], Dionne Bucy, MD  ? 3 months ago Biceps tendinitis of right upper extremity  ? [facility], Ria Comment, PA-C  ? 6 months ago Type 2 diabetes mellitus with diabetic polyneuropathy, without long-term current use of insulin ([place])  ? [facility] [place], Dionne Bucy, MD  ? 9 months ago Type 2 diabetes mellitus with diabetic polyneuropathy, without long-term current use of insulin ([place])  ? [facility] [place], Dionne Bucy, MD  ? 1 year ago Hypertension associated with diabetes [facility])  ? [facility] [place], Dionne Bucy, MD  ? ?  ?  ? ? ?  ?  ?  ?  ?

## 2022-04-18 NOTE — Telephone Encounter (Signed)
?  Chief Complaint: Depression ?Symptoms: Depression, mentioned hurting herself to Home Health Nurse ?Frequency: today ?Pertinent Negatives: Patient denies plan, present thoughts of hurting herself. ?Disposition: '[]'$ ED /'[]'$ Urgent Care (no appt availability in office) / '[x]'$ Appointment(In office/virtual)/ '[]'$  [place] [facility]/ '[]'$ Home Care/ '[]'$ Refused Recommended Disposition /'[]'$  Mobile Bus/ '[]'$  Follow-up with PCP ?Additional Notes: Please see earlier encounters. Pt states she spoke to daughter for 1-1/2 hours and "Feels better." Denies any present suicidal ideation.While speaking with pt, police officer arrived for well check. Continued to speak with pt. Resources given, [place] and 988 hotline. Advised to CB if needed. Pt has appt tomorrow.  Reason for Disposition ? Sometimes has thoughts of suicide ? ?Answer Assessment - Initial Assessment Questions ?1. MAIN CONCERN: "What happened that made you call today?" ?    Called back, earlier encounter. ?2. RISK OF HARM - SUICIDAL IDEATION:  "Do you ever have thoughts of hurting or killing yourself?"  (e.g., yes, no, no but preoccupation with thoughts about death) ?  - WISH TO BE DEAD:  "Have you wished you were dead or wished you could go to sleep and not wake up?" ?  - INTENT:  "Have you had any thoughts of hurting or killing yourself?" (e.g., yes, no, N/A) If Yes, ask: "Are you having these thoughts about killing yourself right NOW?" ?  - PLAN: "Have you thought about how you might do this?" "Do you have a specific plan for how you would do this?" (e.g., gun, knife, overdose, no plan, N/A) ?  - ACCESS: If yes to PLAN, "Do you have access to ?" (e.g., pills, gun in house, knife in kitchen) ?    Yes, Had thoughts off and on. No plan ?3. RISK OF HARM - SUICIDE ATTEMPT: "Have you tried to harm yourself recently?" If Yes, ask: When was this?"   ?    When I was 18 and 22 ?4. RISK OF HARM - SUICIDAL BEHAVIOR: "Have you ever done anything, started to do anything, or  prepared to do anything to end your life?" (e.g., collected pills, bought a gun, wrote a suicide note, cut yourself, started but changed your mind) ?    no ?5. EVENTS AND STRESSORS: "Has there been any new stress or recent changes in your life?" (e.g., death of loved one, homelessness, negative event, relationship breakup, work) ?   Health issues,lost  husband 3 years ago, BF last year ?6. FUNCTIONAL IMPAIRMENT: "How have things been going for you overall? Have you had more difficulty than usual doing your normal daily activities?"  (e.g., better, same, worse; self-care, school, work, interactions) ?    Poor health ?7. SUPPORT: "Who is with you now?" "Who do you live with?" "Do you have family or friends who you can talk to?"  ?    Daughter supportive ?8. THERAPIST: "Do you have a counselor or therapist? Name?" ?    No ?9. ALCOHOL USE OR SUBSTANCE USE (DRUG USE): "Do you drink alcohol or use any illegal drugs?" ?    no ?10. OTHER: "Do you have any other physical symptoms right now?" (e.g., fever) ?      no ? ?Protocols used: Suicide Concerns-A-AH ? ?

## 2022-04-18 NOTE — Telephone Encounter (Signed)
Call received from [place]. For information regarding patient . Well check visit will be done. ?

## 2022-04-18 NOTE — Telephone Encounter (Signed)
Spoke to [employer] and [employer]. She conveyed understanding. She adv if wound care orders change, she would need verbals or either we can fax orders to her at [phone].  ?

## 2022-04-18 NOTE — Telephone Encounter (Signed)
Call received from Neoma Laming, RN from [facility] that during home visit patient was expressing suicidal ideations last night. Recent breakup with finance / boyfriend, constant health issues with abdominal wound. Requesting referral to psy / therapist. Reports no plan at this time. Patient's daughter contacted by Neoma Laming, RN. Neoma Laming reports patient is alone at this time. Called patient to triage and no answer, left voicemail to call clinic back [phone]. ?

## 2022-04-18 NOTE — Telephone Encounter (Signed)
No call back from patient at this time. 911 contacted to do "well check" on patient. Neoma Laming, RN last reported patient was home alone and when this NT attempted to contact patient no answer.  ?

## 2022-04-19 ENCOUNTER — Encounter: Payer: Self-pay | Admitting: Family Medicine

## 2022-04-19 ENCOUNTER — Ambulatory Visit (INDEPENDENT_AMBULATORY_CARE_PROVIDER_SITE_OTHER): Payer: Medicare Other | Admitting: Family Medicine

## 2022-04-19 VITALS — BP 130/85 | HR 70 | Temp 98.0°F | Resp 16 | Ht 67.0 in | Wt 234.1 lb

## 2022-04-19 DIAGNOSIS — E66812 Obesity, class 2: Secondary | ICD-10-CM

## 2022-04-19 DIAGNOSIS — M0579 Rheumatoid arthritis with rheumatoid factor of multiple sites without organ or systems involvement: Secondary | ICD-10-CM

## 2022-04-19 DIAGNOSIS — Z0000 Encounter for general adult medical examination without abnormal findings: Secondary | ICD-10-CM

## 2022-04-19 DIAGNOSIS — F331 Major depressive disorder, recurrent, moderate: Secondary | ICD-10-CM

## 2022-04-19 DIAGNOSIS — G63 Polyneuropathy in diseases classified elsewhere: Secondary | ICD-10-CM | POA: Diagnosis not present

## 2022-04-19 DIAGNOSIS — I152 Hypertension secondary to endocrine disorders: Secondary | ICD-10-CM

## 2022-04-19 DIAGNOSIS — D692 Other nonthrombocytopenic purpura: Secondary | ICD-10-CM

## 2022-04-19 DIAGNOSIS — E785 Hyperlipidemia, unspecified: Secondary | ICD-10-CM

## 2022-04-19 DIAGNOSIS — E1159 Type 2 diabetes mellitus with other circulatory complications: Secondary | ICD-10-CM

## 2022-04-19 DIAGNOSIS — Z6836 Body mass index (BMI) 36.0-36.9, adult: Secondary | ICD-10-CM

## 2022-04-19 DIAGNOSIS — Z780 Asymptomatic menopausal state: Secondary | ICD-10-CM | POA: Diagnosis not present

## 2022-04-19 DIAGNOSIS — E118 Type 2 diabetes mellitus with unspecified complications: Secondary | ICD-10-CM

## 2022-04-19 DIAGNOSIS — E1169 Type 2 diabetes mellitus with other specified complication: Secondary | ICD-10-CM

## 2022-04-19 DIAGNOSIS — T8189XA Other complications of procedures, not elsewhere classified, initial encounter: Secondary | ICD-10-CM | POA: Diagnosis not present

## 2022-04-19 NOTE — Assessment & Plan Note (Signed)
Chronic and uncontrolled Grief from end of relationship with fiance, various past traumas and losses Received call from home health RN yesterday reporting passive SI Patient had safety check from Police Department She states that she is safe to herself currently and thinks of her daughter and grandchildren as a reason to not have active SI at this time No active plan Continue Wellbutrin and Cymbalta at current doses, but is interested in seeing psychiatry-referral placed today Gave options for therapists in the area Contracted for safety

## 2022-04-19 NOTE — Assessment & Plan Note (Signed)
Well controlled on home readings with [facility] RN Continue current medications Recheck metabolic panel

## 2022-04-19 NOTE — Assessment & Plan Note (Signed)
Reviewed recent A1c - near goal  No changes today Will continue current meds

## 2022-04-19 NOTE — Assessment & Plan Note (Signed)
Discussed importance of healthy weight management Discussed diet and exercise  

## 2022-04-19 NOTE — Patient Instructions (Signed)
Consider asking at the pharmacy about the tetanus shot (TDAP) and the shingles shot (Shingrix)

## 2022-04-19 NOTE — Assessment & Plan Note (Signed)
Chronic and well controlled Continue lyrica at current dose

## 2022-04-19 NOTE — Progress Notes (Deleted)
Patient is a 71 year old female who underwent panniculectomy with Dr. Marla Roe on 02/05/2022.  Patient has had a somewhat complicated postoperative recovery including several hospitalizations and issues such as abdominal wall cellulitis, medication noncompliance, and wound dehiscence.  Patient was found to have a cavitating wound over the right side of the panniculectomy incision.  Wound VAC therapy was attempted but was unsuccessful.  Patient has been seen in the clinic last week and the week before and has been given instructions on wound dressing and wound care.  Overall the wound has been appearing noninfected and has healthy tissue.  Patient presents to the clinic today for follow-up of her wound.

## 2022-04-19 NOTE — Assessment & Plan Note (Signed)
F/b rheum Chronic and stable

## 2022-04-19 NOTE — Assessment & Plan Note (Signed)
Previously uncontrolled Continue statin Repeat FLP and CMP Goal LDL < 70  

## 2022-04-19 NOTE — Assessment & Plan Note (Signed)
Chronic and stable Continue to monitor 

## 2022-04-20 ENCOUNTER — Ambulatory Visit: Payer: Medicare Other | Admitting: Student

## 2022-04-20 DIAGNOSIS — E871 Hypo-osmolality and hyponatremia: Secondary | ICD-10-CM | POA: Diagnosis not present

## 2022-04-20 DIAGNOSIS — E785 Hyperlipidemia, unspecified: Secondary | ICD-10-CM | POA: Diagnosis not present

## 2022-04-20 DIAGNOSIS — I2510 Atherosclerotic heart disease of native coronary artery without angina pectoris: Secondary | ICD-10-CM | POA: Diagnosis not present

## 2022-04-20 DIAGNOSIS — Z7984 Long term (current) use of oral hypoglycemic drugs: Secondary | ICD-10-CM | POA: Diagnosis not present

## 2022-04-20 DIAGNOSIS — M797 Fibromyalgia: Secondary | ICD-10-CM | POA: Diagnosis not present

## 2022-04-20 DIAGNOSIS — Z9181 History of falling: Secondary | ICD-10-CM | POA: Diagnosis not present

## 2022-04-20 DIAGNOSIS — M545 Low back pain, unspecified: Secondary | ICD-10-CM | POA: Diagnosis not present

## 2022-04-20 DIAGNOSIS — F32A Depression, unspecified: Secondary | ICD-10-CM | POA: Diagnosis not present

## 2022-04-20 DIAGNOSIS — G8929 Other chronic pain: Secondary | ICD-10-CM | POA: Diagnosis not present

## 2022-04-20 DIAGNOSIS — I509 Heart failure, unspecified: Secondary | ICD-10-CM | POA: Diagnosis not present

## 2022-04-20 DIAGNOSIS — Z87891 Personal history of nicotine dependence: Secondary | ICD-10-CM | POA: Diagnosis not present

## 2022-04-20 DIAGNOSIS — M48061 Spinal stenosis, lumbar region without neurogenic claudication: Secondary | ICD-10-CM | POA: Diagnosis not present

## 2022-04-20 DIAGNOSIS — E119 Type 2 diabetes mellitus without complications: Secondary | ICD-10-CM | POA: Diagnosis not present

## 2022-04-20 DIAGNOSIS — Z96652 Presence of left artificial knee joint: Secondary | ICD-10-CM | POA: Diagnosis not present

## 2022-04-20 DIAGNOSIS — M069 Rheumatoid arthritis, unspecified: Secondary | ICD-10-CM | POA: Diagnosis not present

## 2022-04-20 DIAGNOSIS — T8149XD Infection following a procedure, other surgical site, subsequent encounter: Secondary | ICD-10-CM | POA: Diagnosis not present

## 2022-04-20 DIAGNOSIS — I110 Hypertensive heart disease with heart failure: Secondary | ICD-10-CM | POA: Diagnosis not present

## 2022-04-20 DIAGNOSIS — M1990 Unspecified osteoarthritis, unspecified site: Secondary | ICD-10-CM | POA: Diagnosis not present

## 2022-04-20 LAB — LIPID PANEL WITH LDL/HDL RATIO
Cholesterol, Total: 238 mg/dL — ABNORMAL HIGH (ref 100–199)
HDL: 51 mg/dL (ref >39)
LDL Chol Calc (NIH): 159 mg/dL — ABNORMAL HIGH (ref 0–99)
LDL/HDL Ratio: 3.1 ratio (ref 0.0–3.2)
Triglycerides: 153 mg/dL — ABNORMAL HIGH (ref 0–149)
VLDL Cholesterol Cal: 28 mg/dL (ref 5–40)

## 2022-04-20 LAB — MICROALBUMIN / CREATININE URINE RATIO
Creatinine, Urine: 86.5 mg/dL
Microalb/Creat Ratio: 21 mg/g creat (ref 0–29)
Microalbumin, Urine: 18.3 ug/mL

## 2022-04-23 DIAGNOSIS — E119 Type 2 diabetes mellitus without complications: Secondary | ICD-10-CM | POA: Diagnosis not present

## 2022-04-23 DIAGNOSIS — Z87891 Personal history of nicotine dependence: Secondary | ICD-10-CM | POA: Diagnosis not present

## 2022-04-23 DIAGNOSIS — E871 Hypo-osmolality and hyponatremia: Secondary | ICD-10-CM | POA: Diagnosis not present

## 2022-04-23 DIAGNOSIS — H2513 Age-related nuclear cataract, bilateral: Secondary | ICD-10-CM | POA: Diagnosis not present

## 2022-04-23 DIAGNOSIS — T8149XD Infection following a procedure, other surgical site, subsequent encounter: Secondary | ICD-10-CM | POA: Diagnosis not present

## 2022-04-23 DIAGNOSIS — E785 Hyperlipidemia, unspecified: Secondary | ICD-10-CM | POA: Diagnosis not present

## 2022-04-23 DIAGNOSIS — I110 Hypertensive heart disease with heart failure: Secondary | ICD-10-CM | POA: Diagnosis not present

## 2022-04-23 DIAGNOSIS — Z96652 Presence of left artificial knee joint: Secondary | ICD-10-CM | POA: Diagnosis not present

## 2022-04-23 DIAGNOSIS — Z9181 History of falling: Secondary | ICD-10-CM | POA: Diagnosis not present

## 2022-04-23 DIAGNOSIS — M545 Low back pain, unspecified: Secondary | ICD-10-CM | POA: Diagnosis not present

## 2022-04-23 DIAGNOSIS — M1990 Unspecified osteoarthritis, unspecified site: Secondary | ICD-10-CM | POA: Diagnosis not present

## 2022-04-23 DIAGNOSIS — G8929 Other chronic pain: Secondary | ICD-10-CM | POA: Diagnosis not present

## 2022-04-23 DIAGNOSIS — Z7984 Long term (current) use of oral hypoglycemic drugs: Secondary | ICD-10-CM | POA: Diagnosis not present

## 2022-04-23 DIAGNOSIS — I2510 Atherosclerotic heart disease of native coronary artery without angina pectoris: Secondary | ICD-10-CM | POA: Diagnosis not present

## 2022-04-23 DIAGNOSIS — M797 Fibromyalgia: Secondary | ICD-10-CM | POA: Diagnosis not present

## 2022-04-23 DIAGNOSIS — H02403 Unspecified ptosis of bilateral eyelids: Secondary | ICD-10-CM | POA: Diagnosis not present

## 2022-04-23 DIAGNOSIS — F32A Depression, unspecified: Secondary | ICD-10-CM | POA: Diagnosis not present

## 2022-04-23 DIAGNOSIS — H43822 Vitreomacular adhesion, left eye: Secondary | ICD-10-CM | POA: Diagnosis not present

## 2022-04-23 DIAGNOSIS — M48061 Spinal stenosis, lumbar region without neurogenic claudication: Secondary | ICD-10-CM | POA: Diagnosis not present

## 2022-04-23 DIAGNOSIS — I509 Heart failure, unspecified: Secondary | ICD-10-CM | POA: Diagnosis not present

## 2022-04-23 DIAGNOSIS — M069 Rheumatoid arthritis, unspecified: Secondary | ICD-10-CM | POA: Diagnosis not present

## 2022-04-23 DIAGNOSIS — Z0389 Encounter for observation for other suspected diseases and conditions ruled out: Secondary | ICD-10-CM | POA: Diagnosis not present

## 2022-04-23 LAB — HM DIABETES EYE EXAM

## 2022-04-24 ENCOUNTER — Ambulatory Visit: Payer: Medicare Other | Admitting: Physician Assistant

## 2022-04-24 DIAGNOSIS — M48061 Spinal stenosis, lumbar region without neurogenic claudication: Secondary | ICD-10-CM | POA: Diagnosis not present

## 2022-04-24 DIAGNOSIS — I110 Hypertensive heart disease with heart failure: Secondary | ICD-10-CM | POA: Diagnosis not present

## 2022-04-24 DIAGNOSIS — T8149XD Infection following a procedure, other surgical site, subsequent encounter: Secondary | ICD-10-CM | POA: Diagnosis not present

## 2022-04-24 DIAGNOSIS — M797 Fibromyalgia: Secondary | ICD-10-CM | POA: Diagnosis not present

## 2022-04-24 DIAGNOSIS — M069 Rheumatoid arthritis, unspecified: Secondary | ICD-10-CM | POA: Diagnosis not present

## 2022-04-24 DIAGNOSIS — I2510 Atherosclerotic heart disease of native coronary artery without angina pectoris: Secondary | ICD-10-CM | POA: Diagnosis not present

## 2022-04-24 DIAGNOSIS — F32A Depression, unspecified: Secondary | ICD-10-CM | POA: Diagnosis not present

## 2022-04-24 DIAGNOSIS — E785 Hyperlipidemia, unspecified: Secondary | ICD-10-CM | POA: Diagnosis not present

## 2022-04-24 DIAGNOSIS — I509 Heart failure, unspecified: Secondary | ICD-10-CM | POA: Diagnosis not present

## 2022-04-24 DIAGNOSIS — M545 Low back pain, unspecified: Secondary | ICD-10-CM | POA: Diagnosis not present

## 2022-04-24 DIAGNOSIS — Z96652 Presence of left artificial knee joint: Secondary | ICD-10-CM | POA: Diagnosis not present

## 2022-04-24 DIAGNOSIS — E119 Type 2 diabetes mellitus without complications: Secondary | ICD-10-CM | POA: Diagnosis not present

## 2022-04-24 DIAGNOSIS — Z87891 Personal history of nicotine dependence: Secondary | ICD-10-CM | POA: Diagnosis not present

## 2022-04-24 DIAGNOSIS — H2513 Age-related nuclear cataract, bilateral: Secondary | ICD-10-CM | POA: Diagnosis not present

## 2022-04-24 DIAGNOSIS — Z9181 History of falling: Secondary | ICD-10-CM | POA: Diagnosis not present

## 2022-04-24 DIAGNOSIS — G8929 Other chronic pain: Secondary | ICD-10-CM | POA: Diagnosis not present

## 2022-04-24 DIAGNOSIS — Z7984 Long term (current) use of oral hypoglycemic drugs: Secondary | ICD-10-CM | POA: Diagnosis not present

## 2022-04-24 DIAGNOSIS — E871 Hypo-osmolality and hyponatremia: Secondary | ICD-10-CM | POA: Diagnosis not present

## 2022-04-24 DIAGNOSIS — M1990 Unspecified osteoarthritis, unspecified site: Secondary | ICD-10-CM | POA: Diagnosis not present

## 2022-04-25 ENCOUNTER — Ambulatory Visit: Payer: Self-pay

## 2022-04-25 ENCOUNTER — Telehealth: Payer: Self-pay | Admitting: *Deleted

## 2022-04-25 DIAGNOSIS — E871 Hypo-osmolality and hyponatremia: Secondary | ICD-10-CM | POA: Diagnosis not present

## 2022-04-25 DIAGNOSIS — Z9181 History of falling: Secondary | ICD-10-CM | POA: Diagnosis not present

## 2022-04-25 DIAGNOSIS — M48061 Spinal stenosis, lumbar region without neurogenic claudication: Secondary | ICD-10-CM | POA: Diagnosis not present

## 2022-04-25 DIAGNOSIS — F32A Depression, unspecified: Secondary | ICD-10-CM | POA: Diagnosis not present

## 2022-04-25 DIAGNOSIS — I509 Heart failure, unspecified: Secondary | ICD-10-CM | POA: Diagnosis not present

## 2022-04-25 DIAGNOSIS — M545 Low back pain, unspecified: Secondary | ICD-10-CM | POA: Diagnosis not present

## 2022-04-25 DIAGNOSIS — M1990 Unspecified osteoarthritis, unspecified site: Secondary | ICD-10-CM | POA: Diagnosis not present

## 2022-04-25 DIAGNOSIS — I110 Hypertensive heart disease with heart failure: Secondary | ICD-10-CM | POA: Diagnosis not present

## 2022-04-25 DIAGNOSIS — T8149XD Infection following a procedure, other surgical site, subsequent encounter: Secondary | ICD-10-CM | POA: Diagnosis not present

## 2022-04-25 DIAGNOSIS — E785 Hyperlipidemia, unspecified: Secondary | ICD-10-CM | POA: Diagnosis not present

## 2022-04-25 DIAGNOSIS — M069 Rheumatoid arthritis, unspecified: Secondary | ICD-10-CM | POA: Diagnosis not present

## 2022-04-25 DIAGNOSIS — Z7984 Long term (current) use of oral hypoglycemic drugs: Secondary | ICD-10-CM | POA: Diagnosis not present

## 2022-04-25 DIAGNOSIS — G8929 Other chronic pain: Secondary | ICD-10-CM | POA: Diagnosis not present

## 2022-04-25 DIAGNOSIS — Z87891 Personal history of nicotine dependence: Secondary | ICD-10-CM | POA: Diagnosis not present

## 2022-04-25 DIAGNOSIS — I2510 Atherosclerotic heart disease of native coronary artery without angina pectoris: Secondary | ICD-10-CM | POA: Diagnosis not present

## 2022-04-25 DIAGNOSIS — E119 Type 2 diabetes mellitus without complications: Secondary | ICD-10-CM | POA: Diagnosis not present

## 2022-04-25 DIAGNOSIS — Z96652 Presence of left artificial knee joint: Secondary | ICD-10-CM | POA: Diagnosis not present

## 2022-04-25 DIAGNOSIS — M797 Fibromyalgia: Secondary | ICD-10-CM | POA: Diagnosis not present

## 2022-04-25 NOTE — Telephone Encounter (Signed)
Yest. Bm more frequent dark brown and sticky.  more frequent stools. 2 days ago stomach 2/3 pressure- comes and goes. Stomach tight and bloated- no dizzyness.    Chief Complaint: dark brown sticky stools and abdominal pressure Symptoms: more frequent stools- 4 today so far, abdominal pressure that is constant and feels tight and bloated Frequency: 3 days Pertinent Negatives: Patient denies fever, pain, jaundiced, black or any color of blood noted Disposition: '[]'$ ED /'[]'$ Urgent Care (no appt availability in office) / '[x]'$ Appointment(In office/virtual)/ '[]'$  [facility]/ '[]'$ Home Care/ '[]'$ Refused Recommended Disposition /'[]'$ [facility]/ '[]'$  Follow-up with PCP Additional Notes: appt made with PCP Thursday.       Reason for Disposition  [1] Abnormal color is unexplained AND [2] persists > 24 hours  [1] Constant abdominal pain AND [2] present > 2 hours  Answer Assessment - Initial Assessment Questions 1. COLOR: "What color is it?" "Is that color in part or all of the stool?"  Dark brown 2. ONSET: "When was the unusual color first noted?"     3 days ago 3. CAUSE: "Have you eaten any food or taken any medicine of this color?" (See listing in BACKGROUND)     no 4. OTHER SYMPTOMS: "Do you have any other symptoms?" (e.g., diarrhea, jaundice, abdominal pain, fever).     Abdominal pressure  Answer Assessment - Initial Assessment Questions 1. LOCATION: "Where does it hurt?"      Feels bloated and stomach hard and urge to bathroom all the time 2. RADIATION: "Does the pain shoot anywhere else?" (e.g., chest, back)     no 3. ONSET: "When did the pain begin?" (e.g., minutes, hours or days ago)      3 days ago 4. SUDDEN: "Gradual or sudden onset?"     sudden 5. PATTERN "Does the pain come and go, or is it constant?"    - If constant: "Is it getting better, staying the same, or worsening?"      (Note: Constant means the pain never goes away completely; most serious pain is constant  and it progresses)     - If intermittent: "How long does it last?" "Do you have pain now?"     (Note: Intermittent means the pain goes away completely between bouts)     Comes and goes- pressure makes pain 6. SEVERITY: "How bad is the pain?"  (e.g., Scale 1-10; mild, moderate, or severe)   - MILD (1-3): doesn't interfere with normal activities, abdomen soft and not tender to touch    - MODERATE (4-7): interferes with normal activities or awakens from sleep, abdomen tender to touch    - SEVERE (8-10): excruciating pain, doubled over, unable to do any normal activities      Mild pressure 7. RECURRENT SYMPTOM: "Have you ever had this type of stomach pain before?" If Yes, ask: "When was the last time?" and "What happened that time?"      Yes- blood transfusion- anal fissure 8. CAUSE: "What do you think is causing the stomach pain?"     unsure 9. RELIEVING/AGGRAVATING FACTORS: "What makes it better or worse?" (e.g., movement, antacids, bowel movement)     Bowel movement 10. OTHER SYMPTOMS: "Do you have any other symptoms?" (e.g., back pain, diarrhea, fever, urination pain, vomiting)       no 11. PREGNANCY: "Is there any chance you are pregnant?" "When was your last menstrual period?"       N/a  Protocols used: Stools - Unusual Color-A-AH, Abdominal Pain - Female-A-AH,  Rectal Bleeding-A-AH

## 2022-04-25 NOTE — Telephone Encounter (Signed)
Received on (04/16/22) Plan of Care Update from [facility] and return.  Given to provider to sign.    Plan of care update signed and faxed back to [facility] on (04/18/22).  Confirmation received and copy scanned into the chart.//AB/CMA

## 2022-04-26 ENCOUNTER — Telehealth: Payer: Self-pay | Admitting: Family Medicine

## 2022-04-26 ENCOUNTER — Ambulatory Visit: Payer: Medicare Other | Admitting: Family Medicine

## 2022-04-26 DIAGNOSIS — G8929 Other chronic pain: Secondary | ICD-10-CM | POA: Diagnosis not present

## 2022-04-26 DIAGNOSIS — F32A Depression, unspecified: Secondary | ICD-10-CM | POA: Diagnosis not present

## 2022-04-26 DIAGNOSIS — M797 Fibromyalgia: Secondary | ICD-10-CM | POA: Diagnosis not present

## 2022-04-26 DIAGNOSIS — I110 Hypertensive heart disease with heart failure: Secondary | ICD-10-CM | POA: Diagnosis not present

## 2022-04-26 DIAGNOSIS — Z96652 Presence of left artificial knee joint: Secondary | ICD-10-CM | POA: Diagnosis not present

## 2022-04-26 DIAGNOSIS — M1990 Unspecified osteoarthritis, unspecified site: Secondary | ICD-10-CM | POA: Diagnosis not present

## 2022-04-26 DIAGNOSIS — Z9181 History of falling: Secondary | ICD-10-CM | POA: Diagnosis not present

## 2022-04-26 DIAGNOSIS — M48061 Spinal stenosis, lumbar region without neurogenic claudication: Secondary | ICD-10-CM | POA: Diagnosis not present

## 2022-04-26 DIAGNOSIS — I2510 Atherosclerotic heart disease of native coronary artery without angina pectoris: Secondary | ICD-10-CM | POA: Diagnosis not present

## 2022-04-26 DIAGNOSIS — M069 Rheumatoid arthritis, unspecified: Secondary | ICD-10-CM | POA: Diagnosis not present

## 2022-04-26 DIAGNOSIS — T8149XD Infection following a procedure, other surgical site, subsequent encounter: Secondary | ICD-10-CM | POA: Diagnosis not present

## 2022-04-26 DIAGNOSIS — Z87891 Personal history of nicotine dependence: Secondary | ICD-10-CM | POA: Diagnosis not present

## 2022-04-26 DIAGNOSIS — Z7984 Long term (current) use of oral hypoglycemic drugs: Secondary | ICD-10-CM | POA: Diagnosis not present

## 2022-04-26 DIAGNOSIS — E785 Hyperlipidemia, unspecified: Secondary | ICD-10-CM | POA: Diagnosis not present

## 2022-04-26 DIAGNOSIS — E119 Type 2 diabetes mellitus without complications: Secondary | ICD-10-CM | POA: Diagnosis not present

## 2022-04-26 DIAGNOSIS — I509 Heart failure, unspecified: Secondary | ICD-10-CM | POA: Diagnosis not present

## 2022-04-26 DIAGNOSIS — E871 Hypo-osmolality and hyponatremia: Secondary | ICD-10-CM | POA: Diagnosis not present

## 2022-04-26 DIAGNOSIS — M545 Low back pain, unspecified: Secondary | ICD-10-CM | POA: Diagnosis not present

## 2022-04-26 NOTE — Telephone Encounter (Signed)
OK for verbals 

## 2022-04-26 NOTE — Telephone Encounter (Signed)
Collet[place] advised as below.

## 2022-04-26 NOTE — Telephone Encounter (Signed)
Home Health Verbal Orders - Caller/Agency: [place] [place] Number: [phone] Requesting OT/PT/Skilled Nursing/Social Work/Speech Therapy: OT Frequency:   2w4 1w2

## 2022-04-27 DIAGNOSIS — I110 Hypertensive heart disease with heart failure: Secondary | ICD-10-CM | POA: Diagnosis not present

## 2022-04-27 DIAGNOSIS — Z96652 Presence of left artificial knee joint: Secondary | ICD-10-CM | POA: Diagnosis not present

## 2022-04-27 DIAGNOSIS — M069 Rheumatoid arthritis, unspecified: Secondary | ICD-10-CM | POA: Diagnosis not present

## 2022-04-27 DIAGNOSIS — M797 Fibromyalgia: Secondary | ICD-10-CM | POA: Diagnosis not present

## 2022-04-27 DIAGNOSIS — M1990 Unspecified osteoarthritis, unspecified site: Secondary | ICD-10-CM | POA: Diagnosis not present

## 2022-04-27 DIAGNOSIS — M48061 Spinal stenosis, lumbar region without neurogenic claudication: Secondary | ICD-10-CM | POA: Diagnosis not present

## 2022-04-27 DIAGNOSIS — I509 Heart failure, unspecified: Secondary | ICD-10-CM | POA: Diagnosis not present

## 2022-04-27 DIAGNOSIS — G8929 Other chronic pain: Secondary | ICD-10-CM | POA: Diagnosis not present

## 2022-04-27 DIAGNOSIS — E785 Hyperlipidemia, unspecified: Secondary | ICD-10-CM | POA: Diagnosis not present

## 2022-04-27 DIAGNOSIS — E119 Type 2 diabetes mellitus without complications: Secondary | ICD-10-CM | POA: Diagnosis not present

## 2022-04-27 DIAGNOSIS — M545 Low back pain, unspecified: Secondary | ICD-10-CM | POA: Diagnosis not present

## 2022-04-27 DIAGNOSIS — I2510 Atherosclerotic heart disease of native coronary artery without angina pectoris: Secondary | ICD-10-CM | POA: Diagnosis not present

## 2022-04-27 DIAGNOSIS — Z87891 Personal history of nicotine dependence: Secondary | ICD-10-CM | POA: Diagnosis not present

## 2022-04-27 DIAGNOSIS — F32A Depression, unspecified: Secondary | ICD-10-CM | POA: Diagnosis not present

## 2022-04-27 DIAGNOSIS — Z7984 Long term (current) use of oral hypoglycemic drugs: Secondary | ICD-10-CM | POA: Diagnosis not present

## 2022-04-27 DIAGNOSIS — Z9181 History of falling: Secondary | ICD-10-CM | POA: Diagnosis not present

## 2022-04-27 DIAGNOSIS — T8149XD Infection following a procedure, other surgical site, subsequent encounter: Secondary | ICD-10-CM | POA: Diagnosis not present

## 2022-05-01 DIAGNOSIS — T8149XD Infection following a procedure, other surgical site, subsequent encounter: Secondary | ICD-10-CM | POA: Diagnosis not present

## 2022-05-01 DIAGNOSIS — I2510 Atherosclerotic heart disease of native coronary artery without angina pectoris: Secondary | ICD-10-CM | POA: Diagnosis not present

## 2022-05-01 DIAGNOSIS — Z9181 History of falling: Secondary | ICD-10-CM | POA: Diagnosis not present

## 2022-05-01 DIAGNOSIS — I110 Hypertensive heart disease with heart failure: Secondary | ICD-10-CM | POA: Diagnosis not present

## 2022-05-01 DIAGNOSIS — M48061 Spinal stenosis, lumbar region without neurogenic claudication: Secondary | ICD-10-CM | POA: Diagnosis not present

## 2022-05-01 DIAGNOSIS — M069 Rheumatoid arthritis, unspecified: Secondary | ICD-10-CM | POA: Diagnosis not present

## 2022-05-01 DIAGNOSIS — I509 Heart failure, unspecified: Secondary | ICD-10-CM | POA: Diagnosis not present

## 2022-05-01 DIAGNOSIS — E785 Hyperlipidemia, unspecified: Secondary | ICD-10-CM | POA: Diagnosis not present

## 2022-05-01 DIAGNOSIS — Z87891 Personal history of nicotine dependence: Secondary | ICD-10-CM | POA: Diagnosis not present

## 2022-05-01 DIAGNOSIS — G8929 Other chronic pain: Secondary | ICD-10-CM | POA: Diagnosis not present

## 2022-05-01 DIAGNOSIS — M545 Low back pain, unspecified: Secondary | ICD-10-CM | POA: Diagnosis not present

## 2022-05-01 DIAGNOSIS — E119 Type 2 diabetes mellitus without complications: Secondary | ICD-10-CM | POA: Diagnosis not present

## 2022-05-01 DIAGNOSIS — Z7984 Long term (current) use of oral hypoglycemic drugs: Secondary | ICD-10-CM | POA: Diagnosis not present

## 2022-05-01 DIAGNOSIS — Z96652 Presence of left artificial knee joint: Secondary | ICD-10-CM | POA: Diagnosis not present

## 2022-05-01 DIAGNOSIS — M1990 Unspecified osteoarthritis, unspecified site: Secondary | ICD-10-CM | POA: Diagnosis not present

## 2022-05-01 DIAGNOSIS — M797 Fibromyalgia: Secondary | ICD-10-CM | POA: Diagnosis not present

## 2022-05-01 DIAGNOSIS — F32A Depression, unspecified: Secondary | ICD-10-CM | POA: Diagnosis not present

## 2022-05-02 DIAGNOSIS — I2510 Atherosclerotic heart disease of native coronary artery without angina pectoris: Secondary | ICD-10-CM | POA: Diagnosis not present

## 2022-05-02 DIAGNOSIS — T8149XD Infection following a procedure, other surgical site, subsequent encounter: Secondary | ICD-10-CM | POA: Diagnosis not present

## 2022-05-02 DIAGNOSIS — E785 Hyperlipidemia, unspecified: Secondary | ICD-10-CM | POA: Diagnosis not present

## 2022-05-02 DIAGNOSIS — F32A Depression, unspecified: Secondary | ICD-10-CM | POA: Diagnosis not present

## 2022-05-02 DIAGNOSIS — M545 Low back pain, unspecified: Secondary | ICD-10-CM | POA: Diagnosis not present

## 2022-05-02 DIAGNOSIS — M069 Rheumatoid arthritis, unspecified: Secondary | ICD-10-CM | POA: Diagnosis not present

## 2022-05-02 DIAGNOSIS — I110 Hypertensive heart disease with heart failure: Secondary | ICD-10-CM | POA: Diagnosis not present

## 2022-05-02 DIAGNOSIS — Z9181 History of falling: Secondary | ICD-10-CM | POA: Diagnosis not present

## 2022-05-02 DIAGNOSIS — E119 Type 2 diabetes mellitus without complications: Secondary | ICD-10-CM | POA: Diagnosis not present

## 2022-05-02 DIAGNOSIS — Z96652 Presence of left artificial knee joint: Secondary | ICD-10-CM | POA: Diagnosis not present

## 2022-05-02 DIAGNOSIS — M1990 Unspecified osteoarthritis, unspecified site: Secondary | ICD-10-CM | POA: Diagnosis not present

## 2022-05-02 DIAGNOSIS — Z7984 Long term (current) use of oral hypoglycemic drugs: Secondary | ICD-10-CM | POA: Diagnosis not present

## 2022-05-02 DIAGNOSIS — I509 Heart failure, unspecified: Secondary | ICD-10-CM | POA: Diagnosis not present

## 2022-05-02 DIAGNOSIS — G8929 Other chronic pain: Secondary | ICD-10-CM | POA: Diagnosis not present

## 2022-05-02 DIAGNOSIS — Z87891 Personal history of nicotine dependence: Secondary | ICD-10-CM | POA: Diagnosis not present

## 2022-05-02 DIAGNOSIS — M797 Fibromyalgia: Secondary | ICD-10-CM | POA: Diagnosis not present

## 2022-05-02 DIAGNOSIS — M48061 Spinal stenosis, lumbar region without neurogenic claudication: Secondary | ICD-10-CM | POA: Diagnosis not present

## 2022-05-03 ENCOUNTER — Other Ambulatory Visit (INDEPENDENT_AMBULATORY_CARE_PROVIDER_SITE_OTHER): Payer: Medicare Other | Admitting: Family Medicine

## 2022-05-03 ENCOUNTER — Encounter: Payer: Self-pay | Admitting: Family Medicine

## 2022-05-03 ENCOUNTER — Other Ambulatory Visit: Payer: Self-pay | Admitting: Family Medicine

## 2022-05-03 DIAGNOSIS — I110 Hypertensive heart disease with heart failure: Secondary | ICD-10-CM | POA: Diagnosis not present

## 2022-05-03 DIAGNOSIS — M1990 Unspecified osteoarthritis, unspecified site: Secondary | ICD-10-CM | POA: Diagnosis not present

## 2022-05-03 DIAGNOSIS — T8130XA Disruption of wound, unspecified, initial encounter: Secondary | ICD-10-CM | POA: Diagnosis not present

## 2022-05-03 DIAGNOSIS — T8149XD Infection following a procedure, other surgical site, subsequent encounter: Secondary | ICD-10-CM | POA: Diagnosis not present

## 2022-05-03 DIAGNOSIS — Z7984 Long term (current) use of oral hypoglycemic drugs: Secondary | ICD-10-CM | POA: Diagnosis not present

## 2022-05-03 DIAGNOSIS — G63 Polyneuropathy in diseases classified elsewhere: Secondary | ICD-10-CM

## 2022-05-03 DIAGNOSIS — M48061 Spinal stenosis, lumbar region without neurogenic claudication: Secondary | ICD-10-CM

## 2022-05-03 DIAGNOSIS — E785 Hyperlipidemia, unspecified: Secondary | ICD-10-CM | POA: Diagnosis not present

## 2022-05-03 DIAGNOSIS — M545 Low back pain, unspecified: Secondary | ICD-10-CM

## 2022-05-03 DIAGNOSIS — M797 Fibromyalgia: Secondary | ICD-10-CM

## 2022-05-03 DIAGNOSIS — I509 Heart failure, unspecified: Secondary | ICD-10-CM

## 2022-05-03 DIAGNOSIS — M069 Rheumatoid arthritis, unspecified: Secondary | ICD-10-CM | POA: Diagnosis not present

## 2022-05-03 DIAGNOSIS — T148XXA Other injury of unspecified body region, initial encounter: Secondary | ICD-10-CM | POA: Diagnosis not present

## 2022-05-03 DIAGNOSIS — L089 Local infection of the skin and subcutaneous tissue, unspecified: Secondary | ICD-10-CM

## 2022-05-03 DIAGNOSIS — I2510 Atherosclerotic heart disease of native coronary artery without angina pectoris: Secondary | ICD-10-CM

## 2022-05-03 DIAGNOSIS — F32A Depression, unspecified: Secondary | ICD-10-CM | POA: Diagnosis not present

## 2022-05-03 DIAGNOSIS — E119 Type 2 diabetes mellitus without complications: Secondary | ICD-10-CM

## 2022-05-03 DIAGNOSIS — Z96652 Presence of left artificial knee joint: Secondary | ICD-10-CM | POA: Diagnosis not present

## 2022-05-03 DIAGNOSIS — Z9181 History of falling: Secondary | ICD-10-CM | POA: Diagnosis not present

## 2022-05-03 DIAGNOSIS — G8929 Other chronic pain: Secondary | ICD-10-CM

## 2022-05-03 DIAGNOSIS — Z87891 Personal history of nicotine dependence: Secondary | ICD-10-CM | POA: Diagnosis not present

## 2022-05-03 NOTE — Progress Notes (Signed)
Received home health orders orders from [facility]. Start of care 02/26/2022.   Certification and orders from 04/27/2022 through 06/25/2022 re reviewed, signed and faxed back to home health company.  Need of intermittent skilled services at home: Difficulty with wound healing after surgical intervention and deconditioning  The home health care plan has been established by me and will be reviewed and updated as needed to maximize patient recovery.  I certify that all home health services have been and will be furnished to the patient while under my care.  Face-to-face encounter in which the need for home health services was established: See hospitalization from 02/14/2022  Patient is receiving home health services for the following diagnoses: Problem List Items Addressed This Visit       Cardiovascular and Mediastinum   Atherosclerosis of coronary artery     Nervous and Auditory   Polyneuropathy associated with underlying disease ([place])     Other   Fibromyalgia   Wound dehiscence   Wound infection   Other Visit Diagnoses     Infection following a procedure, other surgical site, subsequent encounter    -  Primary   Diabetes mellitus without complication ([place])       Hypertensive heart disease with congestive heart failure, unspecified heart failure type ([place])       Heart failure, unspecified HF chronicity, unspecified heart failure type ([facility])       Chronic low back pain, unspecified back pain laterality, unspecified whether sciatica present       Other chronic pain       Spinal stenosis of lumbar region without neurogenic claudication       History of falling            Lavon Paganini, MD

## 2022-05-04 DIAGNOSIS — I110 Hypertensive heart disease with heart failure: Secondary | ICD-10-CM | POA: Diagnosis not present

## 2022-05-04 DIAGNOSIS — F32A Depression, unspecified: Secondary | ICD-10-CM | POA: Diagnosis not present

## 2022-05-04 DIAGNOSIS — Z96652 Presence of left artificial knee joint: Secondary | ICD-10-CM | POA: Diagnosis not present

## 2022-05-04 DIAGNOSIS — Z7984 Long term (current) use of oral hypoglycemic drugs: Secondary | ICD-10-CM | POA: Diagnosis not present

## 2022-05-04 DIAGNOSIS — G8929 Other chronic pain: Secondary | ICD-10-CM | POA: Diagnosis not present

## 2022-05-04 DIAGNOSIS — Z87891 Personal history of nicotine dependence: Secondary | ICD-10-CM | POA: Diagnosis not present

## 2022-05-04 DIAGNOSIS — M797 Fibromyalgia: Secondary | ICD-10-CM | POA: Diagnosis not present

## 2022-05-04 DIAGNOSIS — Z9181 History of falling: Secondary | ICD-10-CM | POA: Diagnosis not present

## 2022-05-04 DIAGNOSIS — M1990 Unspecified osteoarthritis, unspecified site: Secondary | ICD-10-CM | POA: Diagnosis not present

## 2022-05-04 DIAGNOSIS — E119 Type 2 diabetes mellitus without complications: Secondary | ICD-10-CM | POA: Diagnosis not present

## 2022-05-04 DIAGNOSIS — I2510 Atherosclerotic heart disease of native coronary artery without angina pectoris: Secondary | ICD-10-CM | POA: Diagnosis not present

## 2022-05-04 DIAGNOSIS — I509 Heart failure, unspecified: Secondary | ICD-10-CM | POA: Diagnosis not present

## 2022-05-04 DIAGNOSIS — M545 Low back pain, unspecified: Secondary | ICD-10-CM | POA: Diagnosis not present

## 2022-05-04 DIAGNOSIS — M069 Rheumatoid arthritis, unspecified: Secondary | ICD-10-CM | POA: Diagnosis not present

## 2022-05-04 DIAGNOSIS — M48061 Spinal stenosis, lumbar region without neurogenic claudication: Secondary | ICD-10-CM | POA: Diagnosis not present

## 2022-05-04 DIAGNOSIS — T8149XD Infection following a procedure, other surgical site, subsequent encounter: Secondary | ICD-10-CM | POA: Diagnosis not present

## 2022-05-04 DIAGNOSIS — E785 Hyperlipidemia, unspecified: Secondary | ICD-10-CM | POA: Diagnosis not present

## 2022-05-07 ENCOUNTER — Encounter: Payer: Medicare Other | Attending: Physician Assistant | Admitting: Physician Assistant

## 2022-05-07 DIAGNOSIS — E785 Hyperlipidemia, unspecified: Secondary | ICD-10-CM | POA: Diagnosis not present

## 2022-05-07 DIAGNOSIS — I5042 Chronic combined systolic (congestive) and diastolic (congestive) heart failure: Secondary | ICD-10-CM | POA: Insufficient documentation

## 2022-05-07 DIAGNOSIS — G8929 Other chronic pain: Secondary | ICD-10-CM | POA: Diagnosis not present

## 2022-05-07 DIAGNOSIS — M069 Rheumatoid arthritis, unspecified: Secondary | ICD-10-CM | POA: Insufficient documentation

## 2022-05-07 DIAGNOSIS — M797 Fibromyalgia: Secondary | ICD-10-CM | POA: Diagnosis not present

## 2022-05-07 DIAGNOSIS — Z6836 Body mass index (BMI) 36.0-36.9, adult: Secondary | ICD-10-CM | POA: Insufficient documentation

## 2022-05-07 DIAGNOSIS — I87309 Chronic venous hypertension (idiopathic) without complications of unspecified lower extremity: Secondary | ICD-10-CM | POA: Insufficient documentation

## 2022-05-07 DIAGNOSIS — F32A Depression, unspecified: Secondary | ICD-10-CM | POA: Diagnosis not present

## 2022-05-07 DIAGNOSIS — T8149XD Infection following a procedure, other surgical site, subsequent encounter: Secondary | ICD-10-CM | POA: Diagnosis not present

## 2022-05-07 DIAGNOSIS — I110 Hypertensive heart disease with heart failure: Secondary | ICD-10-CM | POA: Diagnosis not present

## 2022-05-07 DIAGNOSIS — L98492 Non-pressure chronic ulcer of skin of other sites with fat layer exposed: Secondary | ICD-10-CM | POA: Insufficient documentation

## 2022-05-07 DIAGNOSIS — M48061 Spinal stenosis, lumbar region without neurogenic claudication: Secondary | ICD-10-CM | POA: Diagnosis not present

## 2022-05-07 DIAGNOSIS — I509 Heart failure, unspecified: Secondary | ICD-10-CM | POA: Diagnosis not present

## 2022-05-07 DIAGNOSIS — E11622 Type 2 diabetes mellitus with other skin ulcer: Secondary | ICD-10-CM | POA: Insufficient documentation

## 2022-05-07 DIAGNOSIS — M1990 Unspecified osteoarthritis, unspecified site: Secondary | ICD-10-CM | POA: Diagnosis not present

## 2022-05-07 DIAGNOSIS — I890 Lymphedema, not elsewhere classified: Secondary | ICD-10-CM | POA: Insufficient documentation

## 2022-05-07 DIAGNOSIS — T8131XA Disruption of external operation (surgical) wound, not elsewhere classified, initial encounter: Secondary | ICD-10-CM | POA: Diagnosis not present

## 2022-05-07 DIAGNOSIS — Z7984 Long term (current) use of oral hypoglycemic drugs: Secondary | ICD-10-CM | POA: Diagnosis not present

## 2022-05-07 DIAGNOSIS — L02211 Cutaneous abscess of abdominal wall: Secondary | ICD-10-CM | POA: Insufficient documentation

## 2022-05-07 DIAGNOSIS — Y838 Other surgical procedures as the cause of abnormal reaction of the patient, or of later complication, without mention of misadventure at the time of the procedure: Secondary | ICD-10-CM | POA: Diagnosis not present

## 2022-05-07 DIAGNOSIS — M545 Low back pain, unspecified: Secondary | ICD-10-CM | POA: Diagnosis not present

## 2022-05-07 DIAGNOSIS — Z96652 Presence of left artificial knee joint: Secondary | ICD-10-CM | POA: Diagnosis not present

## 2022-05-07 DIAGNOSIS — E119 Type 2 diabetes mellitus without complications: Secondary | ICD-10-CM | POA: Diagnosis not present

## 2022-05-07 DIAGNOSIS — I2510 Atherosclerotic heart disease of native coronary artery without angina pectoris: Secondary | ICD-10-CM | POA: Diagnosis not present

## 2022-05-07 DIAGNOSIS — Z87891 Personal history of nicotine dependence: Secondary | ICD-10-CM | POA: Diagnosis not present

## 2022-05-07 DIAGNOSIS — Z9181 History of falling: Secondary | ICD-10-CM | POA: Diagnosis not present

## 2022-05-07 NOTE — Progress Notes (Signed)
Pagaduan, ALEXEI EY ([id number]) Visit Report for 05/07/2022 Allergy List Details Patient Name: Longie, Stassi D. Date of Service: 05/07/2022 8:45 AM Medical Record Number: [id number] Patient Account Number: [id number] Date of Birth/Sex: February 16, 1951 (71 y.o. F) Treating RN: Carlene Coria Primary Care Helmut Hennon: Lavon Paganini Other Clinician: Referring Jadarius Commons: Lavon Paganini Treating Keegen Heffern/Extender: Jeri Cos Weeks in Treatment: 0 Allergies Active Allergies sulfa drugs Lasix doxycycline empagliflozin Allergy Notes Electronic Signature(s) Signed: 05/07/2022 5:11:25 PM By: Carlene Coria RN Entered By: Carlene Coria on 05/07/2022 09:05:08 Linder, Darlynn D. ([id number]) -------------------------------------------------------------------------------- Arrival Information Details Patient Name: Alire, Holland D. Date of Service: 05/07/2022 8:45 AM Medical Record Number: [id number] Patient Account Number: [id number] Date of Birth/Sex: 05-08-1951 (71 y.o. F) Treating RN: Carlene Coria Primary Care Seriah Brotzman: Lavon Paganini Other Clinician: Referring Acel Natzke: Lavon Paganini Treating Irish Piech/Extender: Skipper Cliche in Treatment: 0 Visit Information Patient Arrived: Ambulatory Arrival Time: 08:58 Accompanied By: self Transfer Assistance: None Patient Identification Verified: Yes Secondary Verification Process Completed: Yes Patient Requires Transmission-Based Precautions: No Patient Has Alerts: Yes Patient Alerts: DIABETIC History Since Last Visit All ordered tests and consults were completed: No Added or deleted any medications: No Any new allergies or adverse reactions: No Had a fall or experienced change in activities of daily living that may affect risk of falls: No Signs or symptoms of abuse/neglect since last visito No Hospitalized since last visit: No Implantable device outside of the clinic excluding cellular tissue based products placed in the center since last visit:  No Has Dressing in Place as Prescribed: Yes Electronic Signature(s) Signed: 05/07/2022 5:11:25 PM By: Carlene Coria RN Entered By: Carlene Coria on 05/07/2022 09:03:40 Selders, Angelie D. ([id number]) -------------------------------------------------------------------------------- Clinic Level of Care Assessment Details Patient Name: Salvas, Alaiah D. Date of Service: 05/07/2022 8:45 AM Medical Record Number: [id number] Patient Account Number: [id number] Date of Birth/Sex: 25-Jun-1951 (71 y.o. F) Treating RN: Carlene Coria Primary Care Ursula Dermody: Lavon Paganini Other Clinician: Referring Saagar Tortorella: Lavon Paganini Treating Caide Campi/Extender: Skipper Cliche in Treatment: 0 Clinic Level of Care Assessment Items TOOL 2 Quantity Score X - Use when only an EandM is performed on the INITIAL visit 1 0 ASSESSMENTS - Nursing Assessment / Reassessment X - General Physical Exam (combine w/ comprehensive assessment (listed just below) when performed on new 1 20 pt. evals) X- 1 25 Comprehensive Assessment (HX, ROS, Risk Assessments, Wounds Hx, etc.) ASSESSMENTS - Wound and Skin Assessment / Reassessment X - Simple Wound Assessment / Reassessment - one wound 1 5 '[]'$  - 0 Complex Wound Assessment / Reassessment - multiple wounds '[]'$  - 0 Dermatologic / Skin Assessment (not related to wound area) ASSESSMENTS - Ostomy and/or Continence Assessment and Care '[]'$  - Incontinence Assessment and Management 0 '[]'$  - 0 Ostomy Care Assessment and Management (repouching, etc.) PROCESS - Coordination of Care X - Simple Patient / Family Education for ongoing care 1 15 '[]'$  - 0 Complex (extensive) Patient / Family Education for ongoing care X- 1 10 Staff obtains Programmer, systems, Records, Test Results / Process Orders '[]'$  - 0 Staff telephones HHA, Nursing Homes / Clarify orders / etc '[]'$  - 0 Routine Transfer to another Facility (non-emergent condition) '[]'$  - 0 Routine Hospital Admission (non-emergent condition) '[]'$  - 0 New  Admissions / Biomedical engineer / Ordering NPWT, Apligraf, etc. '[]'$  - 0 Emergency Hospital Admission (emergent condition) X- 1 10 Simple Discharge Coordination '[]'$  - 0 Complex (extensive) Discharge Coordination PROCESS - Special Needs '[]'$  - Pediatric / Minor Patient Management 0 '[]'$  - 0 Isolation Patient Management '[]'$  - 0 Hearing /  Language / Visual special needs '[]'$  - 0 Assessment of Community assistance (transportation, D/C planning, etc.) '[]'$  - 0 Additional assistance / Altered mentation '[]'$  - 0 Support Surface(s) Assessment (bed, cushion, seat, etc.) INTERVENTIONS - Wound Cleansing / Measurement X - Wound Imaging (photographs - any number of wounds) 1 5 '[]'$  - 0 Wound Tracing (instead of photographs) X- 1 5 Simple Wound Measurement - one wound '[]'$  - 0 Complex Wound Measurement - multiple wounds Fiala, Bricelyn D. ([id number]) X- 1 5 Simple Wound Cleansing - one wound '[]'$  - 0 Complex Wound Cleansing - multiple wounds INTERVENTIONS - Wound Dressings '[]'$  - Small Wound Dressing one or multiple wounds 0 X- 1 15 Medium Wound Dressing one or multiple wounds '[]'$  - 0 Large Wound Dressing one or multiple wounds '[]'$  - 0 Application of Medications - injection INTERVENTIONS - Miscellaneous '[]'$  - External ear exam 0 '[]'$  - 0 Specimen Collection (cultures, biopsies, blood, body fluids, etc.) '[]'$  - 0 Specimen(s) / Culture(s) sent or taken to Lab for analysis '[]'$  - 0 Patient Transfer (multiple staff / Civil Service fast streamer / Similar devices) '[]'$  - 0 Simple Staple / Suture removal (25 or less) '[]'$  - 0 Complex Staple / Suture removal (26 or more) '[]'$  - 0 Hypo / Hyperglycemic Management (close monitor of Blood Glucose) '[]'$  - 0 Ankle / Brachial Index (ABI) - do not check if billed separately Has the patient been seen at the hospital within the last three years: Yes Total Score: 115 Level Of Care: New/Established - Level 3 Electronic Signature(s) Signed: 05/07/2022 5:11:25 PM By: Carlene Coria RN Entered  By: Carlene Coria on 05/07/2022 09:45:22 Swearengin, Kasie D. ([id number]) -------------------------------------------------------------------------------- Encounter Discharge Information Details Patient Name: Yeater, Lysandra D. Date of Service: 05/07/2022 8:45 AM Medical Record Number: [id number] Patient Account Number: [id number] Date of Birth/Sex: [birth year] (71 y.o. F) Treating RN: Carlene Coria Primary Care Derreck Wiltsey: Lavon Paganini Other Clinician: Referring Molly Maselli: Lavon Paganini Treating Sweet Jarvis/Extender: Skipper Cliche in Treatment: 0 Encounter Discharge Information Items Discharge Condition: Stable Ambulatory Status: Ambulatory Discharge Destination: Home Transportation: Private Auto Accompanied By: self Schedule Follow-up Appointment: Yes Clinical Summary of Care: Provided Form Type Recipient Paper Patient EG Electronic Signature(s) Signed: 05/07/2022 5:06:33 PM By: Enedina Finner RCP, RRT, CHT Entered By: Enedina Finner on 05/07/2022 10:04:50 Mcgonagle, Tarrie D. ([id number]) -------------------------------------------------------------------------------- Lower Extremity Assessment Details Patient Name: Guile, Briza D. Date of Service: 05/07/2022 8:45 AM Medical Record Number: [id number] Patient Account Number: [id number] Date of Birth/Sex: [birth year] (71 y.o. F) Treating RN: Carlene Coria Primary Care Mayrani Khamis: Lavon Paganini Other Clinician: Referring Cornell Gaber: Lavon Paganini Treating Aline Wesche/Extender: Jeri Cos Weeks in Treatment: 0 Electronic Signature(s) Signed: 05/07/2022 5:11:25 PM By: Carlene Coria RN Entered By: Carlene Coria on 05/07/2022 09:10:04 Giorgio, Jlyn D. ([id number]) -------------------------------------------------------------------------------- Multi Wound Chart Details Patient Name: Paragas, Emberli D. Date of Service: 05/07/2022 8:45 AM Medical Record Number: [id number] Patient Account Number: [id number] Date of Birth/Sex:  Nov 28, 1951 (71 y.o. F) Treating RN: Carlene Coria Primary Care Kashmere Daywalt: Lavon Paganini Other Clinician: Referring Ishan Sanroman: Lavon Paganini Treating Tyyne Cliett/Extender: Skipper Cliche in Treatment: 0 Vital Signs Height(in): 67 Pulse(bpm): 28 Weight(lbs): 230 Blood Pressure(mmHg): 127/57 Body Mass Index(BMI): 36 Temperature(F): 98 Respiratory Rate(breaths/min): 18 Photos: [N/A:N/A] Wound Location: Right Abdomen - Lower Quadrant N/A N/A Wounding Event: Gradually Appeared N/A N/A Primary Etiology: Abscess N/A N/A Comorbid History: Lymphedema, Congestive Heart N/A N/A Failure, Coronary Artery Disease, Hypertension, Type II Diabetes, Rheumatoid Arthritis, Osteoarthritis, Confinement Anxiety Date Acquired: 01/24/2022 N/A N/A Weeks of Treatment: 0 N/A N/A Wound  Status: Open N/A N/A Wound Recurrence: No N/A N/A Measurements L x W x D (cm) 0.5x3x2.2 N/A N/A Area (cm) : 1.178 N/A N/A Volume (cm) : 2.592 N/A N/A Classification: Full Thickness Without Exposed N/A N/A Support Structures Exudate Amount: Medium N/A N/A Exudate Type: Serosanguineous N/A N/A Exudate Color: red, brown N/A N/A Granulation Amount: Large (67-100%) N/A N/A Granulation Quality: Red N/A N/A Necrotic Amount: None Present (0%) N/A N/A Exposed Structures: Fat Layer (Subcutaneous Tissue): N/A N/A Yes Fascia: No Tendon: No Muscle: No Joint: No Bone: No Epithelialization: None N/A N/A Treatment Notes Electronic Signature(s) Signed: 05/07/2022 9:22:14 AM By: Carlene Coria RN Entered By: Carlene Coria on 05/07/2022 09:22:14 Tadros, Ocie D. ([id number]) -------------------------------------------------------------------------------- [place] Details Patient Name: Alguire, Torra D. Date of Service: 05/07/2022 8:45 AM Medical Record Number: [id number] Patient Account Number: [id number] Date of Birth/Sex: [birth year] (71 y.o. F) Treating RN: Carlene Coria Primary Care Mayjor Ager: Lavon Paganini Other Clinician: Referring Kasidee Voisin: Lavon Paganini Treating Kinser Fellman/Extender: Skipper Cliche in Treatment: 0 Active Inactive Nutrition Nursing Diagnoses: Potential for alteratiion in Nutrition/Potential for imbalanced nutrition Goals: Patient/caregiver verbalizes understanding of need to maintain therapeutic glucose control per primary care physician Date Initiated: 05/07/2022 Target Resolution Date: 06/06/2022 Goal Status: Active Interventions: Assess HgA1c results as ordered upon admission and as needed Assess patient nutrition upon admission and as needed per policy Notes: Wound/Skin Impairment Nursing Diagnoses: Knowledge deficit related to ulceration/compromised skin integrity Goals: Patient/caregiver will verbalize understanding of skin care regimen Date Initiated: 05/07/2022 Target Resolution Date: 06/06/2022 Goal Status: Active Ulcer/skin breakdown will have a volume reduction of 30% by week 4 Date Initiated: 05/07/2022 Target Resolution Date: 06/06/2022 Goal Status: Active Ulcer/skin breakdown will have a volume reduction of 50% by week 8 Date Initiated: 05/07/2022 Target Resolution Date: 07/07/2022 Goal Status: Active Ulcer/skin breakdown will have a volume reduction of 80% by week 12 Date Initiated: 05/07/2022 Target Resolution Date: 08/07/2022 Goal Status: Active Ulcer/skin breakdown will heal within 14 weeks Date Initiated: 05/07/2022 Target Resolution Date: 09/06/2022 Goal Status: Active Interventions: Assess patient/caregiver ability to obtain necessary supplies Assess patient/caregiver ability to perform ulcer/skin care regimen upon admission and as needed Assess ulceration(s) every visit Notes: Electronic Signature(s) Signed: 05/07/2022 9:21:57 AM By: Carlene Coria RN Entered By: Carlene Coria on 05/07/2022 09:21:57 Simmer, Calvary D. ([id number]) -------------------------------------------------------------------------------- Pain Assessment Details Patient  Name: Juday, Skii D. Date of Service: 05/07/2022 8:45 AM Medical Record Number: [id number] Patient Account Number: [id number] Date of Birth/Sex: 09/23/1951 (71 y.o. F) Treating RN: Carlene Coria Primary Care Genevie Elman: Lavon Paganini Other Clinician: Referring Korbin Mapps: Lavon Paganini Treating Cristin Penaflor/Extender: Skipper Cliche in Treatment: 0 Active Problems Location of Pain Severity and Description of Pain Patient Has Paino No Site Locations Pain Management and Medication Current Pain Management: Electronic Signature(s) Signed: 05/07/2022 5:11:25 PM By: Carlene Coria RN Entered By: Carlene Coria on 05/07/2022 09:03:46 Mcclean, Shiela D. ([id number]) -------------------------------------------------------------------------------- Patient/Caregiver Education Details Patient Name: Kingbird, Cheron D. Date of Service: 05/07/2022 8:45 AM Medical Record Number: [id number] Patient Account Number: [id number] Date of Birth/Gender: May 31, 1951 (71 y.o. F) Treating RN: Carlene Coria Primary Care Physician: Lavon Paganini Other Clinician: Referring Physician: Lavon Paganini Treating Physician/Extender: Skipper Cliche in Treatment: 0 Education Assessment Education Provided To: Patient Education Topics Provided Wound/Skin Impairment: Methods: Explain/Verbal Responses: State content correctly Electronic Signature(s) Signed: 05/07/2022 5:11:25 PM By: Carlene Coria RN Entered By: Carlene Coria on 05/07/2022 09:45:39 Niehaus, Analyah D. ([id number]) -------------------------------------------------------------------------------- Wound Assessment Details Patient Name: Ishmael, Electa D. Date of Service: 05/07/2022 8:45 AM Medical  Record Number: [id number] Patient Account Number: [id number] Date of Birth/Sex: 1951-08-31 (71 y.o. F) Treating RN: Carlene Coria Primary Care Krrish Freund: Lavon Paganini Other Clinician: Referring Martise Waddell: Lavon Paganini Treating Neils Siracusa/Extender: Skipper Cliche  in Treatment: 0 Wound Status Wound Number: 9 Primary Abscess Etiology: Wound Location: Right Abdomen - Lower Quadrant Wound Open Wounding Event: Gradually Appeared Status: Date Acquired: 01/24/2022 Comorbid Lymphedema, Congestive Heart Failure, Coronary Artery Weeks Of Treatment: 0 History: Disease, Hypertension, Type II Diabetes, Rheumatoid Clustered Wound: No Arthritis, Osteoarthritis, Confinement Anxiety Photos Wound Measurements Length: (cm) 0.5 Width: (cm) 3 Depth: (cm) 2.2 Area: (cm) 1.178 Volume: (cm) 2.592 % Reduction in Area: 0% % Reduction in Volume: 0% Epithelialization: None Tunneling: Yes Location 1 Position (o'clock): 3 Maximum Distance: (cm) 6.4 Location 2 Position (o'clock): 9 Maximum Distance: (cm) 3.8 Undermining: No Wound Description Classification: Full Thickness Without Exposed Support Structu Exudate Amount: Medium Exudate Type: Serosanguineous Exudate Color: red, brown res Foul Odor After Cleansing: No Slough/Fibrino No Wound Bed Granulation Amount: Large (67-100%) Exposed Structure Granulation Quality: Red Fascia Exposed: No Necrotic Amount: None Present (0%) Fat Layer (Subcutaneous Tissue) Exposed: Yes Tendon Exposed: No Muscle Exposed: No Joint Exposed: No Bone Exposed: No Treatment Notes Wound #9 (Abdomen - Lower Quadrant) Wound Laterality: Right Joslin, Jamesetta D. ([id number]) Cleanser Peri-Wound Care Topical Nystop Nystatin Powder, 15 (g) bottle Primary Dressing Hydrofera Blue Ready Transfer Foam, 4x5 (in/in) Discharge Instruction: Apply Hydrofera Blue Ready to wound bed as directed Secondary Dressing ABD Pad 5x9 (in/in) Discharge Instruction: Cover with ABD pad Secured With Medipore Tape - 53M Medipore H Soft Cloth Surgical Tape, 2x2 (in/yd) Compression Wrap Compression Stockings Add-Ons Electronic Signature(s) Signed: 05/07/2022 5:11:25 PM By: Carlene Coria RN Entered By: Carlene Coria on 05/07/2022 09:44:21 Stewart, Renley D.  ([id number]) -------------------------------------------------------------------------------- Vitals Details Patient Name: Erich, Akela D. Date of Service: 05/07/2022 8:45 AM Medical Record Number: [id number] Patient Account Number: [id number] Date of Birth/Sex: 09-22-1951 (71 y.o. F) Treating RN: Carlene Coria Primary Care Iden Stripling: Lavon Paganini Other Clinician: Referring Calysta Craigo: Lavon Paganini Treating Deyvi Bonanno/Extender: Skipper Cliche in Treatment: 0 Vital Signs Time Taken: 09:03 Temperature (F): 98 Height (in): 67 Pulse (bpm): 66 Source: Stated Respiratory Rate (breaths/min): 18 Weight (lbs): 230 Blood Pressure (mmHg): 127/57 Source: Stated Reference Range: 80 - 120 mg / dl Body Mass Index (BMI): 36 Electronic Signature(s) Signed: 05/07/2022 5:11:25 PM By: Carlene Coria RN Entered By: Carlene Coria on 05/07/2022 09:04:29

## 2022-05-07 NOTE — Progress Notes (Signed)
Hiney, LECRETIA BUCZEK ([id number]) Visit Report for 05/07/2022 Chief Complaint Document Details Patient Name: [name] [name], [name] Date of Service: 05/07/2022 8:45 AM Medical Record Number: [id number] Patient Account Number: [id number] Date of Birth/Sex: 1951-05-09 (71 y.o. F) Treating RN: Carlene Coria Primary Care Provider: Lavon Paganini Other Clinician: Referring Provider: Lavon Paganini Treating Provider/Extender: Skipper Cliche in Treatment: 0 Information Obtained from: Patient Chief Complaint Reopened/Abscessed Surgical Wound on the Lower Abdominal Wall Electronic Signature(s) Signed: 05/07/2022 9:38:41 AM By: Worthy Keeler PA-C Entered By: Worthy Keeler on 05/07/2022 09:38:41 [name] [name], [name] ([id number]) -------------------------------------------------------------------------------- HPI Details Patient Name: [name], [name] Date of Service: 05/07/2022 8:45 AM Medical Record Number: [id number] Patient Account Number: [id number] Date of Birth/Sex: Jan 01, 1951 (71 y.o. F) Treating RN: Carlene Coria Primary Care Provider: Lavon Paganini Other Clinician: Referring Provider: Lavon Paganini Treating Provider/Extender: Skipper Cliche in Treatment: 0 History of Present Illness Location: left lower extremity Severity: improved since her initial hospitalization but stable since Duration: 6 weeks Context: began after her third treatment of Remicade for rheumatoid arthritis Modifying Factors: morbid obesity, chronic venous hypertension, lymphedema, diabetes mellitus type 2, methotrexate use, Remicade use, Associated Signs and Symptoms: pain and swelling left lower extremity HPI Description: she was seen last week where a 2 layer light compression system was applied. She removed that on Sunday because of itching. She states that she did see her primary care physician who has prescribed her a stronger diuretic and she will begin that today. When she removed her dressing she applied  some Neosporin and some gauze. There remains considerable concern about her ability to keep the wrap in place for a week she has been prescribed lymphedema pumps but she does not use those either. We will encourage her to use those twice a day for an hour each time. These were prescribed to her by Dr. Hinton Lovely READMISSION 01/04/17; this is a patient who is a type II diabetic on oral agents. I note she was seen in this clinic 2-3 years ago. I was not involved in her care at that point. She tells [name] that she is had ulcers on her lateral left lower extremity since just before Christmas. There was no obvious precipitant to this. She apparently has been on long-term suppressive penicillin prescribed by Dr. 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 She has noted increasing erythema along with all of this and I think this is what prompted her to come in today. She does not have a known history of PAD or neuropathy ABI in this clinic was 1.06 on the left. The patient has not been requiring any compression on her legs although she does have compression stockings at home as well as external compression pumps. She has been using neither of these. She has not been systemically unwell the patient is also known to the local vein and vascular group. Apparently her external compression pumps were previously prescribed by Dr. Delana Meyer. She has had apparent bilateral ablations done in the past although I have none of these records. 01/15/17; the patient returns for rewrap of her left leg last week and reports that was put on too tight, she had to remove it on Sunday. She states this actually occurred her even though the previous 3 lateral wrap she was able to tolerate well. I don't think there is been much in the way of expansion of the erythema on the  lateral left leg lateral left heel and lateral left foot. As  mentioned previously I think this is chronic venous insufficiency and not really an active cellulitis. She has 2 small open areas in the lower left calf 01/22/17; the patient complains of really generalized pain. She has rheumatoid arthritis as well as fibromyalgia. She had to take the wrap off yesterday. She has small open areas on the lateral left heel and foot. 02/01/2017 -- the patient is seen by me today as she's had compression wraps for 10 days and I understand she did not realize she had to come back to see [name]. She did see Dr. Delana Meyer yesterday and his notes are not in, but the patient tells [name] that he is going to recommend a lymphedema pump and has not recommended any further venous workup or intervention. 02/06/17; the patient went to see Dr. Hinton Lovely of vascular surgery although I don't remember specifically being involved with this discussion. He felt she had chronic venous insufficiency without surgery or intervention necessary at this time. Also noted lymphedema and type 2 diabetes. In the meantime she did not tolerate the wrap on the right leg stating that the small wound anteriorly" burns like fire" 02/13/17; the lesions on the left lateral and left medial leg have closed over. Still looks a little vulnerable on the lateral aspect on the left Right; still an open area here but smaller and appears to be well granulated. Using Silver Collegen. She uses her own wrap on the right leg last week and I think week and continue that on both legs this week 02/26/17; the lesion on the left lateral leg and left medial leg are both closed over. Still to open areas on the right medial leg. She is been using her own stockings and the edema control looks adequate 03/12/17 she has no open areas on the left lateral or left medial leg. She still has an open area on the right medial leg and last week we do find an area over the right medial malleolus. She is been to see vascular surgery who did not recommend  any further venous workup. She has lymphedema pumps which she uses although edema doesn't appear to be a big part of her current nonhealing state. She has been using Prisma Readmission: 05-07-2022 upon evaluation today patient presents for initial inspection here in our clinic concerning a surgical wound over the lower abdominal area. She had a panniculectomy which was performed on 02-05-2022 by Dr. Audelia Hives. Initially the surgery with Dr. Marla Roe was successful seemingly until the patient was seen initially on 03-02-2022 for ED to hospital admission due to cellulitis in this area. She subsequently had surgery which was irrigation and debridement with wound VAC placement that was on 03-04-2022. Unfortunately the wound VAC was never really significantly successful and the patient had a second hospital admission on 03-13-2022. She has had visit to the ER since on 04-05-2022. Eventually she decided to put in a referral herself to come in for further evaluation here at the wound center. She is having a hard time getting this to heal and to be honest wanted some help. With that being said she is now outside of her 90-day postop global period which is great news. All things being said I do think this is good to be a fairly complicated process to heal due to the fact that there are some significant tunnel areas at 3:00 and 9:00 along with the depth straight in. The patient is aware that  this is probably get a take quite a bit of time but nonetheless does want help in doing the best thing possible to get it to heal as quickly as possible and I think that we can definitely help with that. Patient does have a history of diabetes her most recent hemoglobin A1c was 7.4 and that was on 03-03-2022. This was a little higher than her previous 2 readings at 3 and 6 months which were at 6.8. Otherwise the patient does have a history of hypertension as well as congestive heart failure as well. She seems to be doing okay  in regard to those issues. Electronic Signature(s) [name] [name], [name] [name] ([id number]) Signed: 05/07/2022 10:26:38 AM By: Worthy Keeler PA-C Entered By: Worthy Keeler on 05/07/2022 10:26:37 [name] [name], [name] ([id number]) -------------------------------------------------------------------------------- Physical Exam Details Patient Name: Thilges, Kathlean D. Date of Service: 05/07/2022 8:45 AM Medical Record Number: [id number] Patient Account Number: [id number] Date of Birth/Sex: Aug 12, 1951 (72 y.o. F) Treating RN: Carlene Coria Primary Care Provider: Lavon Paganini Other Clinician: Referring Provider: Lavon Paganini Treating Provider/Extender: Skipper Cliche in Treatment: 0 Constitutional sitting or standing blood pressure is within target range for patient.. pulse regular and within target range for patient.[name] respirations regular, non- labored and within target range for patient.[name] temperature within target range for patient.. Obese and well-hydrated in no acute distress. Eyes conjunctiva clear no eyelid edema noted. pupils equal round and reactive to light and accommodation. Ears, Nose, Mouth, and Throat no gross abnormality of ear auricles or external auditory canals. normal hearing noted during conversation. mucus membranes moist. Respiratory normal breathing without difficulty. Musculoskeletal normal gait and posture. no significant deformity or arthritic changes, no loss or range of motion, no clubbing. Notes Upon inspection patient has some depth to her wound currently. With that being said there is also a tunnel both at 3:00 and 9:00 with the 3:00 being significantly worse than the 9:00. Nonetheless I think the wound looks clean and there is a lot of drainage we definitely need something that will absorb and also filled the space to allow this to slowly fill in from the inside out. I do think Hydrofera Blue may be an excellent option here and that was discussed with the patient  currently. A wound VAC could also be beneficial but apparently it was just not working appropriately. My guess is they were not really filling the tunnels appropriately in my opinion. Electronic Signature(s) Signed: 05/07/2022 10:34:49 AM By: Worthy Keeler PA-C Entered By: Worthy Keeler on 05/07/2022 10:34:49 [name] [name], [name] ([id number]) -------------------------------------------------------------------------------- Physician Orders Details Patient Name: Mullan, Donetta D. Date of Service: 05/07/2022 8:45 AM Medical Record Number: [id number] Patient Account Number: [id number] Date of Birth/Sex: [birth year] (71 y.o. F) Treating RN: Carlene Coria Primary Care Provider: Lavon Paganini Other Clinician: Referring Provider: Lavon Paganini Treating Provider/Extender: Skipper Cliche in Treatment: 0 Verbal / Phone Orders: No Diagnosis Coding ICD-10 Coding Code Description T81.31XA Disruption of external operation (surgical) wound, not elsewhere classified, initial encounter L02.211 Cutaneous abscess of abdominal wall L98.492 Non-pressure chronic ulcer of skin of other sites with fat layer exposed I50.42 Chronic combined systolic (congestive) and diastolic (congestive) heart failure E11.622 Type 2 diabetes mellitus with other skin ulcer I10 Essential (primary) hypertension Follow-up Appointments o Return Appointment in 1 week. [facility] for wound care. May utilize formulary equivalent dressing for wound treatment orders unless otherwise specified. Home Health Nurse may visit [facility] to address patientos wound care needs. Jackquline [place] [phone] o **  Please direct any NON-WOUND related issues/requests for orders to patient's Primary Care Physician. **If current dressing causes regression in wound condition, may D/C ordered dressing product/s and apply Normal Saline Moist Dressing daily until next [place] or Other MD appointment. **Notify [facility] of regression in wound condition at [phone]. Bathing/ Shower/ Hygiene o May shower; gently cleanse wound with antibacterial soap, rinse and pat dry prior to dressing wounds - on days dressing changed Wound Treatment Wound #9 - Abdomen - Lower Quadrant Wound Laterality: Right Topical: Nystop Nystatin Powder, 15 (g) bottle 3 x Per Week/30 Days Primary Dressing: Hydrofera Blue Ready Transfer Foam, 4x5 (in/in) 3 x Per Week/30 Days Discharge Instructions: Apply Hydrofera Blue Ready to wound bed as directed Secondary Dressing: ABD Pad 5x9 (in/in) 3 x Per Week/30 Days Discharge Instructions: Cover with ABD pad Secured With: Medipore Tape - 61M Medipore H Soft Cloth Surgical Tape, 2x2 (in/yd) 3 x Per Week/30 Days Patient Medications Allergies: sulfa drugs, Lasix, doxycycline, empagliflozin Notifications Medication Indication Start End nystatin 05/07/2022 DOSE topical 100,000 unit/gram powder - powder topical applied in the peri-wound where the rash is present x 30 days Electronic Signature(s) Signed: 05/07/2022 10:36:27 AM By: Worthy Keeler PA-C Previous Signature: 05/07/2022 10:02:01 AM Version By: Carlene Coria RN Entered By: Worthy Keeler on 05/07/2022 10:36:27 [name] [name], [name] ([id number]) [name] [name], [name] ([id number]) -------------------------------------------------------------------------------- Problem List Details Patient Name: Ebeling, Penney D. Date of Service: 05/07/2022 8:45 AM Medical Record Number: [id number] Patient Account Number: [id number] Date of Birth/Sex: 1951/02/13 (71 y.o. F) Treating RN: Carlene Coria Primary Care Provider: Lavon Paganini Other Clinician: Referring Provider: Lavon Paganini Treating Provider/Extender: Skipper Cliche in Treatment: 0 Active Problems ICD-10 Encounter Code Description Active Date MDM Diagnosis T81.31XA Disruption of external operation (surgical) wound, not elsewhere 05/07/2022 No Yes classified, initial  encounter L02.211 Cutaneous abscess of abdominal wall 05/07/2022 No Yes L98.492 Non-pressure chronic ulcer of skin of other sites with fat layer exposed 05/07/2022 No Yes I50.42 Chronic combined systolic (congestive) and diastolic (congestive) heart 05/07/2022 No Yes failure E11.622 Type 2 diabetes mellitus with other skin ulcer 05/07/2022 No Yes I10 Essential (primary) hypertension 05/07/2022 No Yes Inactive Problems Resolved Problems Electronic Signature(s) Signed: 05/07/2022 9:37:54 AM By: Worthy Keeler PA-C Entered By: Worthy Keeler on 05/07/2022 09:37:53 [name] [name], [name] ([id number]) -------------------------------------------------------------------------------- Progress Note Details Patient Name: Stroot, Edward D. Date of Service: 05/07/2022 8:45 AM Medical Record Number: [id number] Patient Account Number: [id number] Date of Birth/Sex: 05/20/1951 (71 y.o. F) Treating RN: Carlene Coria Primary Care Provider: Lavon Paganini Other Clinician: Referring Provider: Lavon Paganini Treating Provider/Extender: Skipper Cliche in Treatment: 0 Subjective Chief Complaint Information obtained from Patient Reopened/Abscessed Surgical Wound on the Lower Abdominal Wall History of Present Illness (HPI) The following HPI elements were documented for the patient's wound: Location: left lower extremity Severity: improved since her initial hospitalization but stable since Duration: 6 weeks Context: began after her third treatment of Remicade for rheumatoid arthritis Modifying Factors: morbid obesity, chronic venous hypertension, lymphedema, diabetes mellitus type 2, methotrexate use, Remicade use, Associated Signs and Symptoms: pain and swelling left lower extremity she was seen last week where a 2 layer light compression system was applied. She removed that on Sunday because of itching. She states that she did see her primary care physician who has prescribed her a stronger diuretic and she will  begin that today. When she removed her dressing she applied some Neosporin and some gauze. There remains considerable concern about her ability  to keep the wrap in place for a week she has been prescribed lymphedema pumps but she does not use those either. We will encourage her to use those twice a day for an hour each time. These were prescribed to her by Dr. Hinton Lovely READMISSION 01/04/17; this is a patient who is a type II diabetic on oral agents. I note she was seen in this clinic 2-3 years ago. I was not involved in her care at that point. She tells [name] that she is had ulcers on her lateral left lower extremity since just before Christmas. There was no obvious precipitant to this. She apparently has been on long-term suppressive penicillin prescribed by Dr. 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 She has noted increasing erythema along with all of this and I think this is what prompted her to come in today. She does not have a known history of PAD or neuropathy ABI in this clinic was 1.06 on the left. The patient has not been requiring any compression on her legs although she does have compression stockings at home as well as external compression pumps. She has been using neither of these. She has not been systemically unwell the patient is also known to the local vein and vascular group. Apparently her external compression pumps were previously prescribed by Dr. Delana Meyer. She has had apparent bilateral ablations done in the past although I have none of these records. 01/15/17; the patient returns for rewrap of her left leg last week and reports that was put on too tight, she had to remove it on Sunday. She states this actually occurred her even though the previous 3 lateral wrap she was able to tolerate well. I don't think there is been much in the way of expansion of the erythema on the  lateral left leg lateral left heel and lateral left foot. As mentioned previously I think this is chronic venous insufficiency and not really an active cellulitis. She has 2 small open areas in the lower left calf 01/22/17; the patient complains of really generalized pain. She has rheumatoid arthritis as well as fibromyalgia. She had to take the wrap off yesterday. She has small open areas on the lateral left heel and foot. 02/01/2017 -- the patient is seen by me today as she's had compression wraps for 10 days and I understand she did not realize she had to come back to see [name]. She did see Dr. Delana Meyer yesterday and his notes are not in, but the patient tells [name] that he is going to recommend a lymphedema pump and has not recommended any further venous workup or intervention. 02/06/17; the patient went to see Dr. Hinton Lovely of vascular surgery although I don't remember specifically being involved with this discussion. He felt she had chronic venous insufficiency without surgery or intervention necessary at this time. Also noted lymphedema and type 2 diabetes. In the meantime she did not tolerate the wrap on the right leg stating that the small wound anteriorly" burns like fire" 02/13/17; the lesions on the left lateral and left medial leg have closed over. Still looks a little vulnerable on the lateral aspect on the left Right; still an open area here but smaller and appears to be well granulated. Using Silver Collegen. She uses her own wrap on the right leg last week and I think week and continue that on both legs this week 02/26/17;  the lesion on the left lateral leg and left medial leg are both closed over. Still to open areas on the right medial leg. She is been using her own stockings and the edema control looks adequate 03/12/17 she has no open areas on the left lateral or left medial leg. She still has an open area on the right medial leg and last week we do find an area over the right medial  malleolus. She is been to see vascular surgery who did not recommend any further venous workup. She has lymphedema pumps which she uses although edema doesn't appear to be a big part of her current nonhealing state. She has been using Prisma Readmission: 05-07-2022 upon evaluation today patient presents for initial inspection here in our clinic concerning a surgical wound over the lower abdominal area. She had a panniculectomy which was performed on 02-05-2022 by Dr. Audelia Hives. Initially the surgery with Dr. Marla Roe was successful seemingly until the patient was seen initially on 03-02-2022 for ED to hospital admission due to cellulitis in this area. She subsequently had surgery which was irrigation and debridement with wound VAC placement that was on 03-04-2022. Unfortunately the wound VAC was never really significantly successful and the patient had a second hospital admission on 03-13-2022. She has had visit to the ER since on 04-05-2022. Eventually she decided to put in a referral herself to come in for further evaluation here at the wound center. She is having a hard time getting this to heal and to be honest wanted some help. With that being said she is now outside of her 90-day postop global period which is great news. All things being said I do think this is good to be a fairly complicated process to heal due to the fact that there are some significant tunnel areas at 3:00 and 9:00 along with the depth straight in. The patient is aware that this is probably get a take quite a bit of time but nonetheless does want help in doing the best thing possible to get it to heal as quickly as possible and I think that we can definitely help with that. [name] [name], [name] ([id number]) Patient does have a history of diabetes her most recent hemoglobin A1c was 7.4 and that was on 03-03-2022. This was a little higher than her previous 2 readings at 3 and 6 months which were at 6.8. Otherwise the patient does have  a history of hypertension as well as congestive heart failure as well. She seems to be doing okay in regard to those issues. Patient History Information obtained from Patient, Chart. Allergies sulfa drugs, Lasix, doxycycline, empagliflozin Family History Cancer - Siblings, Heart Disease - Father, Hypertension - Mother,Father, Lung Disease - Father, Thyroid Problems - Mother, No family history of Diabetes, Hereditary Spherocytosis, Kidney Disease, Seizures, Stroke, Tuberculosis. Social History Former smoker, Marital Status - Widowed, Alcohol Use - Never, Drug Use - No History, Caffeine Use - Rarely - coffee. Medical History Eyes Denies history of Cataracts, Glaucoma, Optic Neuritis Ear/Nose/Mouth/Throat Denies history of Chronic sinus problems/congestion, Middle ear problems Hematologic/Lymphatic Patient has history of Lymphedema Denies history of Anemia, Hemophilia, Human Immunodeficiency Virus, Sickle Cell Disease Respiratory Denies history of Aspiration, Asthma, Chronic Obstructive Pulmonary Disease (COPD), Pneumothorax, Sleep Apnea, Tuberculosis Cardiovascular Patient has history of Congestive Heart Failure, Coronary Artery Disease, Hypertension Denies history of Angina, Arrhythmia, Deep Vein Thrombosis, Hypotension, Myocardial Infarction, Peripheral Arterial Disease, Peripheral Venous Disease, Phlebitis, Vasculitis Gastrointestinal Denies history of Cirrhosis , Colitis, Crohn s, Hepatitis A,  Hepatitis B, Hepatitis C Endocrine Patient has history of Type II Diabetes Denies history of Type I Diabetes Genitourinary Denies history of End Stage Renal Disease Immunological Denies history of Lupus Erythematosus, Raynaud s, Scleroderma Integumentary (Skin) Denies history of History of Burn, History of pressure wounds Musculoskeletal Patient has history of Rheumatoid Arthritis, Osteoarthritis Denies history of Gout, Osteomyelitis Neurologic Denies history of Dementia, Neuropathy,  Quadriplegia, Paraplegia, Seizure Disorder Oncologic Denies history of Received Chemotherapy, Received Radiation Psychiatric Patient has history of Confinement Anxiety Denies history of Anorexia/bulimia Patient is treated with Oral Agents. Blood sugar is tested. Blood sugar results noted at the following times: Breakfast - 119/125, Bedtime - 119/125. Hospitalization/Surgery History - cellulitis. - hernia. 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 [name] [name], [name] ([id number]) Objective Constitutional sitting or standing blood pressure is within target range for patient.. pulse regular and within target range for patient.[name] respirations regular, non- labored and within target range for patient.[name] temperature within target range for patient.. Obese and well-hydrated in no acute distress. Vitals Time Taken: 9:03 AM, Height: 67 in, Source: Stated, Weight: 230 lbs, Source: Stated, BMI: 36, Temperature: 98 F, Pulse: 66 bpm, Respiratory Rate: 18 breaths/min, Blood Pressure: 127/57 mmHg. Eyes conjunctiva clear no eyelid edema noted. pupils equal round and reactive to light and accommodation. Ears, Nose, Mouth, and Throat no gross abnormality of ear auricles or external auditory canals. normal hearing noted during conversation. mucus membranes moist. Respiratory normal breathing without difficulty. Musculoskeletal normal gait and posture. no significant deformity or arthritic changes, no loss or range of motion, no clubbing. General Notes: Upon inspection patient has some depth to her wound currently. With that being said there is also a tunnel both at 3:00 and 9:00 with the 3:00 being significantly worse than the  9:00. Nonetheless I think the wound looks clean and there is a lot of drainage we definitely need something that will absorb and also filled the space to allow this to slowly fill in from the inside out. I do think Hydrofera Blue may be an excellent option here and that was discussed with the patient currently. A wound VAC could also be beneficial but apparently it was just not working appropriately. My guess is they were not really filling the tunnels appropriately in my opinion. Integumentary (Hair, Skin) Wound #9 status is Open. Original cause of wound was Gradually Appeared. The date acquired was: 01/24/2022. The wound is located on the Right Abdomen - Lower Quadrant. The wound measures 0.5cm length x 3cm width x 2.2cm depth; 1.178cm^2 area and 2.592cm^3 volume. There is Fat Layer (Subcutaneous Tissue) exposed. There is no undermining noted, however, there is tunneling at 3:00 with a maximum distance of 6.4cm. There is additional tunneling and at 9:00 with a maximum distance of 3.8cm. There is a medium amount of serosanguineous drainage noted. There is large (67-100%) red granulation within the wound bed. There is no necrotic tissue within the wound bed. 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 Home Health: CONTINUE  Home Health for wound care. May utilize formulary equivalent dressing for wound treatment orders unless otherwise specified. Home Health Nurse may visit PRN to address patient s wound care needs. Jackquline Denmark [phone] **Please direct any NON-WOUND related  issues/requests for orders to patient's Primary Care Physician. **If current dressing causes regression in wound condition, may D/C ordered dressing product/s and apply Normal Saline Moist Dressing daily until next [place] or Other MD appointment. **Notify [facility] of regression in wound condition at [phone]. Bathing/ Shower/ Hygiene: May shower; gently cleanse wound with antibacterial soap, rinse and pat dry prior to dressing wounds - on days dressing changed The following medication(s) was prescribed: nystatin topical 100,000 unit/gram powder powder topical applied in the peri-wound where the rash is present x 30 days starting 05/07/2022 WOUND #9: - Abdomen - Lower Quadrant Wound Laterality: Right [name] [name], [name] ([id number]) Topical: Nystop Nystatin Powder, 15 (g) bottle 3 x Per Week/30 Days Primary Dressing: Hydrofera Blue Ready Transfer Foam, 4x5 (in/in) 3 x Per Week/30 Days Discharge Instructions: Apply Hydrofera Blue Ready to wound bed as directed Secondary Dressing: ABD Pad 5x9 (in/in) 3 x Per Week/30 Days Discharge Instructions: Cover with ABD pad Secured With: Medipore Tape - 38M Medipore H Soft Cloth Surgical Tape, 2x2 (in/yd) 3 x Per Week/30 Days 1. I would recommend currently that we go and continue with the recommendation for dressing changes 3 times per week although Minna switch over to [facility] packing we showed the patient how this is done today and we will also send orders for the home health nurse. 2. Also did recommend that we have the patient continue to monitor for any signs of worsening or infection. Office if anything changes she should let me know right now however I feel like it seems pretty clean. 3. Regarding use of nystatin powder around the edges of the wound again this is more due to the fact that the patient is having a little bit of a yeast rash where its been staying and so wet. I think the powder will help to keep this from  being an ongoing issue. 4. We are going to plan to see her about weekly although the first visit may be a little bit longer than a week we will see if we do not get her scheduled. We will see patient back for reevaluation in 1 week here in the clinic. If anything worsens or changes patient will contact our office for additional recommendations. Electronic Signature(s) Signed: 05/07/2022 10:36:53 AM By: Worthy Keeler PA-C Entered By: Worthy Keeler on 05/07/2022 10:36:53 [name] [name], [name] ([id number]) -------------------------------------------------------------------------------- ROS/PFSH Details Patient Name: [name] [name], Kenae D. Date of Service: 05/07/2022 8:45 AM Medical Record Number: [id number] Patient Account Number: [id number] Date of Birth/Sex: [birth year] (71 y.o. F) Treating RN: Carlene Coria Primary Care Provider: Lavon Paganini Other Clinician: Referring Provider: Lavon Paganini Treating Provider/Extender: Skipper Cliche in Treatment: 0 Information Obtained From Patient Chart Integumentary (Skin) Complaints and Symptoms: Positive for: Wounds Medical History: Negative for: History of Burn; History of pressure wounds Constitutional Symptoms (General Health) Medical History: Past Medical History Notes: CHF, Spinal Stenosis, RA, DM, fibromyalgia, CAD Eyes Medical History: Negative for: Cataracts; Glaucoma; Optic Neuritis Ear/Nose/Mouth/Throat Medical History: Negative for: Chronic sinus problems/congestion; Middle ear problems Hematologic/Lymphatic Medical History: Positive for: Lymphedema Negative for: Anemia; Hemophilia; Human Immunodeficiency Virus; Sickle Cell Disease Respiratory Medical History: Negative for: Aspiration; Asthma; Chronic Obstructive Pulmonary Disease (COPD); Pneumothorax; Sleep Apnea; Tuberculosis Cardiovascular Medical History: Positive for: Congestive Heart  Failure; Coronary Artery Disease; Hypertension Negative for: Angina; Arrhythmia;  Deep Vein Thrombosis; Hypotension; Myocardial Infarction; Peripheral Arterial Disease; Peripheral Venous Disease; Phlebitis; Vasculitis Past Medical History Notes: stent due to a blockage Gastrointestinal Medical History: Negative for: Cirrhosis ; Colitis; Crohnos; Hepatitis A; Hepatitis B; Hepatitis C Past Medical History Notes: peptic ulcers Endocrine Medical History: Positive for: Type II Diabetes Negative for: Type I Diabetes Shober, Joyce D. 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 Neurologic Medical History: Negative for: Dementia; Neuropathy; Quadriplegia; Paraplegia; Seizure Disorder Oncologic Medical History: Negative for: Received Chemotherapy; Received Radiation Psychiatric Medical History: Positive for: Confinement Anxiety Negative for: Anorexia/bulimia Immunizations Pneumococcal Vaccine: Received Pneumococcal Vaccination: No Implantable Devices None Hospitalization / Surgery History Type of Hospitalization/Surgery cellulitis hernia Family and Social History Cancer: Yes - Siblings; Diabetes: No; Heart Disease: Yes - Father; Hereditary Spherocytosis: No; Hypertension: Yes - Mother,Father; Kidney Disease: No; Lung Disease: Yes - Father; Seizures: No; Stroke: No; Thyroid Problems: Yes - Mother; Tuberculosis: No; Former smoker; Marital Status - Widowed; Alcohol Use: Never; Drug Use: No History; Caffeine Use: Rarely - coffee; Financial Concerns: No; Food, Clothing or Shelter Needs: No; Support  System Lacking: No; Transportation Concerns: No Electronic Signature(s) Signed: 05/07/2022 4:19:54 PM By: Worthy Keeler PA-C Signed: 05/07/2022 5:11:25 PM By: Carlene Coria RN Entered By: Carlene Coria on 05/07/2022 09:07:54 Mawhinney, Jia D. ([id number]) -------------------------------------------------------------------------------- SuperBill Details Patient Name: Sweeden, Harleigh D. Date of Service: 05/07/2022 Medical Record Number: [id number] Patient Account Number: [id number] Date of Birth/Sex: [birth year] (71 y.o. F) Treating RN: Carlene Coria Primary Care Provider: Lavon Paganini Other Clinician: Referring Provider: Lavon Paganini Treating Provider/Extender: Skipper Cliche in Treatment: 0 Diagnosis Coding ICD-10 Codes Code Description T81.31XA Disruption of external operation (surgical) wound, not elsewhere classified, initial encounter L02.211 Cutaneous abscess of abdominal wall L98.492 Non-pressure chronic ulcer of skin of other sites with fat layer exposed I50.42 Chronic combined systolic (congestive) and diastolic (congestive) heart failure E11.622 Type 2 diabetes mellitus with other skin ulcer I10 Essential (primary) hypertension Facility Procedures CPT4 Code: 69450388 Description: 99213 - WOUND CARE VISIT-LEV 3 EST PT Modifier: Quantity: 1 Physician Procedures CPT4 Code: 8280034 Description: 91791 - WC PHYS LEVEL 4 - NEW PT Modifier: Quantity: 1 CPT4 Code: Description: ICD-10 Diagnosis Description T81.31XA Disruption of external operation (surgical) wound, not elsewhere classifi L02.211 Cutaneous abscess of abdominal wall L98.492 Non-pressure chronic ulcer of skin of other sites with fat layer exposed  I50.42 Chronic combined systolic (congestive) and diastolic (congestive) heart f Modifier: ed, initial encounter ailure Quantity: Electronic Signature(s) Signed: 05/07/2022 10:37:10 AM By: Worthy Keeler PA-C Entered By: Worthy Keeler on 05/07/2022 10:37:09

## 2022-05-07 NOTE — Progress Notes (Signed)
Palau, DENEE BOEDER ([id number]) Visit Report for 05/07/2022 Abuse Risk Screen Details Patient Name: [name] [name], [name] Date of Service: 05/07/2022 8:45 AM Medical Record Number: [id number] Patient Account Number: [id number] Date of Birth/Sex: 1951-03-18 (71 y.o. F) Treating RN: [name] [name] Primary Care [name] [name]: [name] [name] Other Clinician: Referring [name] [name]: [name] [name] Treating [name] [name]/Extender: [name] [name] in Treatment: 0 Abuse Risk Screen Items Answer ABUSE RISK SCREEN: Has anyone close to you tried to hurt or harm you recentlyo No Do you feel uncomfortable with anyone in your familyo No Has anyone forced you do things that you didnot want to doo No Electronic Signature(s) Signed: 05/07/2022 5:11:25 PM By: [name] Coria RN Entered By: [name] [name] on 05/07/2022 09:08:19 [name] [name], [name] ([id number]) -------------------------------------------------------------------------------- Activities of Daily Living Details Patient Name: [name] [name], [name] Date of Service: 05/07/2022 8:45 AM Medical Record Number: [id number] Patient Account Number: [id number] Date of Birth/Sex: [birth year] (71 y.o. F) Treating RN: [name] [name] Primary Care Virgie Chery: [name] [name] Other Clinician: Referring [name] [name]: [name] [name] Treating Shalaina Guardiola/Extender: [name] [name] in Treatment: 0 Activities of Daily Living Items Answer Activities of Daily Living (Please select one for each item) Drive Automobile Completely Able Take Medications Completely Able Use Telephone Completely Able Care for Appearance Completely Able Use Toilet Completely Able Bath / Shower Completely Able Dress Self Completely Able Feed Self Completely Able Walk Completely Able Get In / Out Bed Completely Able Housework Completely Able Prepare Meals Completely Able Handle Money Completely Able Shop for Self Completely Able Electronic Signature(s) Signed: 05/07/2022 5:11:25 PM By: [name] Coria RN Entered By:  [name] [name] on 05/07/2022 09:08:44 [name] [name], [name] ([id number]) -------------------------------------------------------------------------------- Education Screening Details Patient Name: [name] [name], [name] Date of Service: 05/07/2022 8:45 AM Medical Record Number: [id number] Patient Account Number: [id number] Date of Birth/Sex: [birth year] (71 y.o. F) Treating RN: [name] [name] Primary Care [name] [name]: [name] [name] Other Clinician: Referring [name] [name]: [name] [name] Treating [name] [name]/Extender: [name] [name] in Treatment: 0 Primary Learner Assessed: Patient Learning Preferences/Education Level/Primary Language Learning Preference: Explanation Highest Education Level: High School Preferred Language: English Cognitive Barrier Language Barrier: No Translator Needed: No Memory Deficit: No Emotional Barrier: No Cultural/Religious Beliefs Affecting Medical Care: No Physical Barrier Impaired Vision: Yes Glasses Impaired Hearing: No Decreased Hand dexterity: No Knowledge/Comprehension Knowledge Level: Medium Comprehension Level: High Ability to understand written instructions: High Ability to understand verbal instructions: High Motivation Anxiety Level: Anxious Cooperation: Cooperative Education Importance: Acknowledges Need Interest in Health Problems: Asks Questions Perception: Coherent Willingness to Engage in Self-Management High Activities: Readiness to Engage in Self-Management High Activities: Electronic Signature(s) Signed: 05/07/2022 5:11:25 PM By: [name] Coria RN Entered By: [name] [name] on 05/07/2022 09:09:09 [name], [name] ([id number]) -------------------------------------------------------------------------------- Fall Risk Assessment Details Patient Name: [name] [name], [name] Date of Service: 05/07/2022 8:45 AM Medical Record Number: [id number] Patient Account Number: [id number] Date of Birth/Sex: 1951/08/30 (71 y.o. F) Treating RN: [name] [name] Primary Care  [name] [name]: [name] [name] Other Clinician: Referring Sairah Knobloch: [name] [name] Treating [name] [name]/Extender: [name] [name] in Treatment: 0 Fall Risk Assessment Items Have you had 2 or more falls in the last 12 monthso 0 No Have you had any fall that resulted in injury in the last 12 monthso 0 No FALLS RISK SCREEN History of falling - immediate or within 3 months 0 No Secondary diagnosis (Do you have 2 or more medical diagnoseso) 0 No Ambulatory aid None/bed rest/wheelchair/nurse 0 No Crutches/cane/walker 0 No Furniture 0 No Intravenous therapy Access/Saline/Heparin Lock 0 No Gait/Transferring Normal/ bed rest/ wheelchair 0 No Weak (short steps with  or without shuffle, stooped but able to lift head while walking, may 0 No seek support from furniture) Impaired (short steps with shuffle, may have difficulty arising from chair, head down, impaired 0 No balance) Mental Status Oriented to own ability 0 No Electronic Signature(s) Signed: 05/07/2022 5:11:25 PM By: [name] Coria RN Entered By: [name] [name] on 05/07/2022 09:09:30 [name] [name], [name] ([id number]) -------------------------------------------------------------------------------- Foot Assessment Details Patient Name: [name] [name], [name] Date of Service: 05/07/2022 8:45 AM Medical Record Number: [id number] Patient Account Number: [id number] Date of Birth/Sex: 06/22/1951 (71 y.o. F) Treating RN: [name] [name] Primary Care [name] [name]: [name] [name] Other Clinician: Referring [name] [name]: [name] [name] Treating Carmelite Violet/Extender: [name] [name] in Treatment: 0 Foot Assessment Items Site Locations + = Sensation present, - = Sensation absent, C = Callus, U = Ulcer R = Redness, W = Warmth, M = Maceration, PU = Pre-ulcerative lesion F = Fissure, S = Swelling, D = Dryness Assessment Right: Left: Other Deformity: No No Prior Foot Ulcer: No No Prior Amputation: No No Charcot Joint: No No Ambulatory Status: Ambulatory Without  Help Gait: Steady Electronic Signature(s) Signed: 05/07/2022 5:11:25 PM By: [name] Coria RN Entered By: [name] [name] on 05/07/2022 09:09:54 [name] [name], [name] ([id number]) -------------------------------------------------------------------------------- Nutrition Risk Screening Details Patient Name: [name] [name], [name] Date of Service: 05/07/2022 8:45 AM Medical Record Number: [id number] Patient Account Number: [id number] Date of Birth/Sex: 09-16-1951 (71 y.o. F) Treating RN: [name] [name] Primary Care Devonn Giampietro: [name] [name] Other Clinician: Referring Malee Grays: [name] [name] Treating Able Malloy/Extender: [name] [name] in Treatment: 0 Height (in): 67 Weight (lbs): 230 Body Mass Index (BMI): 36 Nutrition Risk Screening Items Score Screening NUTRITION RISK SCREEN: I have an illness or condition that made me change the kind and/or amount of food I eat 0 No I eat fewer than two meals per day 0 No I eat few fruits and vegetables, or milk products 0 No I have three or more drinks of beer, liquor or wine almost every day 0 No I have tooth or mouth problems that make it hard for me to eat 0 No I don't always have enough money to buy the food I need 0 No I eat alone most of the time 0 No I take three or more different prescribed or over-the-counter drugs a day 1 Yes Without wanting to, I have lost or gained 10 pounds in the last six months 0 No I am not always physically able to shop, cook and/or feed myself 0 No Nutrition Protocols Good Risk Protocol 0 No interventions needed Moderate Risk Protocol High Risk Proctocol Risk Level: Good Risk Score: 1 Electronic Signature(s) Signed: 05/07/2022 5:11:25 PM By: [name] Coria RN Entered By: [name] [name] on 05/07/2022 09:09:47

## 2022-05-08 NOTE — Progress Notes (Deleted)
      Established patient visit   Patient: [name] [name]   DOB: 1950-12-24   71 y.o. Female  MRN: [id number] Visit Date: 05/10/2022  Today's healthcare provider: Lavon Paganini, MD   No chief complaint on file.  Subjective    HPI  ***  Medications: Outpatient Medications Prior to Visit  Medication Sig   acetaminophen (TYLENOL) 500 MG tablet Take 1,000 mg by mouth every 6 (six) hours as needed for mild pain.   Biotin w/ Vitamins C & E (HAIR/SKIN/NAILS PO) Take 1 tablet by mouth daily.   buPROPion (WELLBUTRIN XL) 300 MG 24 hr tablet NEW PRESCRIPTION REQUEST: TAKE ONE TABLET BY MOUTH DAILY   carvedilol (COREG) 6.25 MG tablet NEW PRESCRIPTION REQUEST: TAKE ONE TABLET BY MOUTH TWICE DAILY   DULoxetine (CYMBALTA) 60 MG capsule NEW PRESCRIPTION REQUEST: TAKE ONE CAPSULE BY MOUTH DAILY   ezetimibe (ZETIA) 10 MG tablet TAKE 1 TABLET BY MOUTH EVERY DAY (Patient taking differently: Take 10 mg by mouth daily.)   GEMTESA 75 MG TABS Take 1 tablet by mouth daily.   losartan (COZAAR) 25 MG tablet Take 1 tablet (25 mg total) by mouth daily.   meloxicam (MOBIC) 15 MG tablet TAKE 1 TABLET (15 MG TOTAL) BY MOUTH DAILY.   metFORMIN (GLUCOPHAGE) 500 MG tablet NEW PRESCRIPTION REQUEST: TAKE ONE TABLET BY MOUTH TWICE DAILY   polyethylene glycol (MIRALAX / GLYCOLAX) 17 g packet Take 17 g by mouth daily as needed for mild constipation.   pregabalin (LYRICA) 75 MG capsule Take 1 capsule (75 mg total) by mouth 3 (three) times daily.   tolterodine (DETROL LA) 4 MG 24 hr capsule Take 4 mg by mouth daily.   No facility-administered medications prior to visit.    Review of Systems  {Labs  Heme  Chem  Endocrine  Serology  Results Review (optional):23779}   Objective    LMP  (LMP Unknown) Comment: age 48 {Show previous vital signs (optional):23777}  Physical Exam  ***  No results found for any visits on 05/10/22.  Assessment & Plan     ***  No follow-ups on file.      {provider  attestation***:1}   Lavon Paganini, MD  [facility] [phone] (phone) [phone] (fax)  [place]

## 2022-05-09 ENCOUNTER — Ambulatory Visit: Payer: Self-pay

## 2022-05-09 DIAGNOSIS — F331 Major depressive disorder, recurrent, moderate: Secondary | ICD-10-CM

## 2022-05-09 DIAGNOSIS — M797 Fibromyalgia: Secondary | ICD-10-CM | POA: Diagnosis not present

## 2022-05-09 DIAGNOSIS — I509 Heart failure, unspecified: Secondary | ICD-10-CM | POA: Diagnosis not present

## 2022-05-09 DIAGNOSIS — I2510 Atherosclerotic heart disease of native coronary artery without angina pectoris: Secondary | ICD-10-CM | POA: Diagnosis not present

## 2022-05-09 DIAGNOSIS — T8149XD Infection following a procedure, other surgical site, subsequent encounter: Secondary | ICD-10-CM | POA: Diagnosis not present

## 2022-05-09 DIAGNOSIS — Z87891 Personal history of nicotine dependence: Secondary | ICD-10-CM | POA: Diagnosis not present

## 2022-05-09 DIAGNOSIS — I110 Hypertensive heart disease with heart failure: Secondary | ICD-10-CM | POA: Diagnosis not present

## 2022-05-09 DIAGNOSIS — M069 Rheumatoid arthritis, unspecified: Secondary | ICD-10-CM | POA: Diagnosis not present

## 2022-05-09 DIAGNOSIS — F32A Depression, unspecified: Secondary | ICD-10-CM | POA: Diagnosis not present

## 2022-05-09 DIAGNOSIS — Z96652 Presence of left artificial knee joint: Secondary | ICD-10-CM | POA: Diagnosis not present

## 2022-05-09 DIAGNOSIS — Z7984 Long term (current) use of oral hypoglycemic drugs: Secondary | ICD-10-CM | POA: Diagnosis not present

## 2022-05-09 DIAGNOSIS — E785 Hyperlipidemia, unspecified: Secondary | ICD-10-CM | POA: Diagnosis not present

## 2022-05-09 DIAGNOSIS — M48061 Spinal stenosis, lumbar region without neurogenic claudication: Secondary | ICD-10-CM | POA: Diagnosis not present

## 2022-05-09 DIAGNOSIS — E119 Type 2 diabetes mellitus without complications: Secondary | ICD-10-CM | POA: Diagnosis not present

## 2022-05-09 DIAGNOSIS — M545 Low back pain, unspecified: Secondary | ICD-10-CM | POA: Diagnosis not present

## 2022-05-09 DIAGNOSIS — M1990 Unspecified osteoarthritis, unspecified site: Secondary | ICD-10-CM | POA: Diagnosis not present

## 2022-05-09 DIAGNOSIS — G8929 Other chronic pain: Secondary | ICD-10-CM | POA: Diagnosis not present

## 2022-05-09 DIAGNOSIS — Z9181 History of falling: Secondary | ICD-10-CM | POA: Diagnosis not present

## 2022-05-09 NOTE — Telephone Encounter (Signed)
Chief Complaint: depression Symptoms: depression anxiety worsened, wishing she was dead Frequency: several days Pertinent Negatives: Patient denies active SI or HI Disposition: '[]'$ ED /'[]'$ Urgent Care (no appt availability in office) / '[x]'$ Appointment(In office/virtual)/ '[]'$  [facility]/ '[]'$ Home Care/ '[]'$ Refused Recommended Disposition /'[]'$ [facility]/ '[]'$  Follow-up with PCP Additional Notes: Debra, RN from [place] was there visiting with pt and noticed increased in depression and stress and pt states she wishes she was dead. Pt states that [employer] has decreased her check of $168 d/t giving wrong amount for several months so pt contacted them to let them know she was barely meeting needs with the amount she had been getting and decreasing the amount was going to impact her. Pt's tv also went out and her sister brought her another one but it went out as well so all of this as affected pt. Pt states that she doesn't want to actively kill herself but knows how she would do it by overdosing on all her pills. Pt also states that she has had this hx of depression and suicidal thoughts since age 85. Pt was referred to a counselor but they wanted to do video appts and pt refused. She states the only way she can get help is 1:1 face to face with someone. I offered to schedule appt for pt today and come in to see a provider but pt states she has appt with Dr. B in the morning and would just keep that one. I advised pt if suicidal thoughts get worse or she feels like she is going to act on them to please call 911 or go to ED to be seen. Pt verbalized understanding.    Reason for Disposition  Requesting to talk with a counselor (mental health worker, psychiatrist, etc.)  Answer Assessment - Initial Assessment Questions 1. MAIN CONCERN: "What happened that made you call today?"     *No Answer* 2. RISK OF HARM - SUICIDAL IDEATION:  "Do you ever have thoughts of hurting or killing yourself?"  (e.g.,  yes, no, no but preoccupation with thoughts about death)   - WISH TO BE DEAD:  "Have you wished you were dead or wished you could go to sleep and not wake up?"   - INTENT:  "Have you had any thoughts of hurting or killing yourself?" (e.g., yes, no, N/A) If Yes, ask: "Are you having these thoughts about killing yourself right NOW?"   - PLAN: "Have you thought about how you might do this?" "Do you have a specific plan for how you would do this?" (e.g., gun, knife, overdose, no plan, N/A)   - ACCESS: If yes to PLAN, "Do you have access to *No Answer*?" (e.g., pills, gun in house, knife in kitchen)     *No Answer* 3. RISK OF HARM - SUICIDE ATTEMPT: "Have you tried to harm yourself recently?" If Yes, ask: When was this?"       *No Answer* 4. RISK OF HARM - SUICIDAL BEHAVIOR: "Have you ever done anything, started to do anything, or prepared to do anything to end your life?" (e.g., collected pills, bought a gun, wrote a suicide note, cut yourself, started but changed your mind)     *No Answer* 5. EVENTS AND STRESSORS: "Has there been any new stress or recent changes in your life?" (e.g., death of loved one, homelessness, negative event, relationship breakup, work)     *No Answer* 6. FUNCTIONAL IMPAIRMENT: "How have things been going for you overall? Have you had  more difficulty than usual doing your normal daily activities?"  (e.g., better, same, worse; self-care, school, work, interactions)     *No Answer* 7. SUPPORT: "Who is with you now?" "Who do you live with?" "Do you have family or friends who you can talk to?"      *No Answer* 8. THERAPIST: "Do you have a counselor or therapist? Name?"     *No Answer* 9. ALCOHOL USE OR SUBSTANCE USE (DRUG USE): "Do you drink alcohol or use any illegal drugs?"     *No Answer* 10. OTHER: "Do you have any other physical symptoms right now?" (e.g., fever)       *No Answer*  Protocols used: Suicide Concerns-A-AH

## 2022-05-10 ENCOUNTER — Ambulatory Visit: Payer: Medicare Other | Admitting: Family Medicine

## 2022-05-10 NOTE — Addendum Note (Signed)
Addended by: Doristine Devoid on: 05/10/2022 11:03 AM   Modules accepted: Orders

## 2022-05-10 NOTE — Telephone Encounter (Signed)
Referral placed.

## 2022-05-10 NOTE — Telephone Encounter (Signed)
Ok to send referral as requested 

## 2022-05-11 ENCOUNTER — Ambulatory Visit: Payer: Medicare Other | Admitting: Student

## 2022-05-11 ENCOUNTER — Other Ambulatory Visit: Payer: Self-pay | Admitting: Family Medicine

## 2022-05-11 DIAGNOSIS — Z87891 Personal history of nicotine dependence: Secondary | ICD-10-CM | POA: Diagnosis not present

## 2022-05-11 DIAGNOSIS — M1990 Unspecified osteoarthritis, unspecified site: Secondary | ICD-10-CM | POA: Diagnosis not present

## 2022-05-11 DIAGNOSIS — Z96652 Presence of left artificial knee joint: Secondary | ICD-10-CM | POA: Diagnosis not present

## 2022-05-11 DIAGNOSIS — I509 Heart failure, unspecified: Secondary | ICD-10-CM | POA: Diagnosis not present

## 2022-05-11 DIAGNOSIS — Z9181 History of falling: Secondary | ICD-10-CM | POA: Diagnosis not present

## 2022-05-11 DIAGNOSIS — I2510 Atherosclerotic heart disease of native coronary artery without angina pectoris: Secondary | ICD-10-CM | POA: Diagnosis not present

## 2022-05-11 DIAGNOSIS — M797 Fibromyalgia: Secondary | ICD-10-CM | POA: Diagnosis not present

## 2022-05-11 DIAGNOSIS — M069 Rheumatoid arthritis, unspecified: Secondary | ICD-10-CM | POA: Diagnosis not present

## 2022-05-11 DIAGNOSIS — T8149XD Infection following a procedure, other surgical site, subsequent encounter: Secondary | ICD-10-CM | POA: Diagnosis not present

## 2022-05-11 DIAGNOSIS — E785 Hyperlipidemia, unspecified: Secondary | ICD-10-CM | POA: Diagnosis not present

## 2022-05-11 DIAGNOSIS — M48061 Spinal stenosis, lumbar region without neurogenic claudication: Secondary | ICD-10-CM | POA: Diagnosis not present

## 2022-05-11 DIAGNOSIS — I110 Hypertensive heart disease with heart failure: Secondary | ICD-10-CM | POA: Diagnosis not present

## 2022-05-11 DIAGNOSIS — F32A Depression, unspecified: Secondary | ICD-10-CM | POA: Diagnosis not present

## 2022-05-11 DIAGNOSIS — G8929 Other chronic pain: Secondary | ICD-10-CM | POA: Diagnosis not present

## 2022-05-11 DIAGNOSIS — M545 Low back pain, unspecified: Secondary | ICD-10-CM | POA: Diagnosis not present

## 2022-05-11 DIAGNOSIS — E119 Type 2 diabetes mellitus without complications: Secondary | ICD-10-CM | POA: Diagnosis not present

## 2022-05-11 DIAGNOSIS — Z7984 Long term (current) use of oral hypoglycemic drugs: Secondary | ICD-10-CM | POA: Diagnosis not present

## 2022-05-11 NOTE — Progress Notes (Deleted)
Patient is a 71 year old female who is status post panniculectomy performed by Dr. Marla Roe on 02/05/2022.  Since surgery, patient has had several hospitalizations and postoperative complications including abdominal wall cellulitis, medication noncompliance and wound dehiscence.  Patient had subsequent irrigation and debridement of her abdomen with myriad and wound VAC placement with Dr. Marla Roe on 03/04/2022.  Patient was discharged home from the hospital and was having issues with her wound VAC.  Patient was last seen in the clinic on 04/13/2022.  At this visit, patient reported she was doing okay.  She had a cavitating 5 x 1 x 2 cm wound just lateral to the right of the midline of the incision.  There was no malodor or surrounding erythema.  A small amount of myriad was placed in the wound with Adaptic, K-Y jelly and gauze.  Patient was instructed to do K-Y jelly and gauze daily.  Today,

## 2022-05-14 ENCOUNTER — Encounter: Payer: Medicare Other | Admitting: Physician Assistant

## 2022-05-14 DIAGNOSIS — I5042 Chronic combined systolic (congestive) and diastolic (congestive) heart failure: Secondary | ICD-10-CM | POA: Diagnosis not present

## 2022-05-14 DIAGNOSIS — M069 Rheumatoid arthritis, unspecified: Secondary | ICD-10-CM | POA: Diagnosis not present

## 2022-05-14 DIAGNOSIS — I10 Essential (primary) hypertension: Secondary | ICD-10-CM | POA: Diagnosis not present

## 2022-05-14 DIAGNOSIS — I890 Lymphedema, not elsewhere classified: Secondary | ICD-10-CM | POA: Diagnosis not present

## 2022-05-14 DIAGNOSIS — I110 Hypertensive heart disease with heart failure: Secondary | ICD-10-CM | POA: Diagnosis not present

## 2022-05-14 DIAGNOSIS — E11622 Type 2 diabetes mellitus with other skin ulcer: Secondary | ICD-10-CM | POA: Diagnosis not present

## 2022-05-14 DIAGNOSIS — L98492 Non-pressure chronic ulcer of skin of other sites with fat layer exposed: Secondary | ICD-10-CM | POA: Diagnosis not present

## 2022-05-14 DIAGNOSIS — T8131XA Disruption of external operation (surgical) wound, not elsewhere classified, initial encounter: Secondary | ICD-10-CM | POA: Diagnosis not present

## 2022-05-14 DIAGNOSIS — L02211 Cutaneous abscess of abdominal wall: Secondary | ICD-10-CM | POA: Diagnosis not present

## 2022-05-14 DIAGNOSIS — I87309 Chronic venous hypertension (idiopathic) without complications of unspecified lower extremity: Secondary | ICD-10-CM | POA: Diagnosis not present

## 2022-05-14 NOTE — Progress Notes (Addendum)
[name], [name] ([id number]) Visit Report for 05/14/2022 Chief Complaint Document Details Patient Name: [name] [name], [name] Date of Service: 05/14/2022 8:00 AM Medical Record Number: [id number] Patient Account Number: [id number] Date of Birth/Sex: 08-07-1951 (71 y.o. F) Treating RN: Carlene Coria Primary Care Provider: Lavon Paganini Other Clinician: Referring Provider: Lavon Paganini Treating Provider/Extender: Skipper Cliche in Treatment: 1 Information Obtained from: Patient Chief Complaint Reopened/Abscessed Surgical Wound on the Lower Abdominal Wall Electronic Signature(s) Signed: 05/14/2022 8:25:18 AM By: Worthy Keeler PA-C Entered By: Worthy Keeler on 05/14/2022 08:25:18 [name] [name], [name] ([id number]) -------------------------------------------------------------------------------- HPI Details Patient Name: [name] [name], [name] Date of Service: 05/14/2022 8:00 AM Medical Record Number: [id number] Patient Account Number: [id number] Date of Birth/Sex: [birth year] (71 y.o. F) Treating RN: Carlene Coria Primary Care Provider: Lavon Paganini Other Clinician: Referring Provider: Lavon Paganini Treating Provider/Extender: Skipper Cliche in Treatment: 1 History of Present Illness Location: left lower extremity Severity: improved since her initial hospitalization but stable since Duration: 6 weeks Context: began after her third treatment of Remicade for rheumatoid arthritis Modifying Factors: morbid obesity, chronic venous hypertension, lymphedema, diabetes mellitus type 2, methotrexate use, Remicade use, Associated Signs and Symptoms: pain and swelling left lower extremity HPI Description: she was seen last week where a 2 layer light compression system was applied. She removed that on Sunday because of itching. She states that she did see her primary care physician who has prescribed her a stronger diuretic and she will begin that today. When she removed her dressing she  applied some Neosporin and some gauze. There remains considerable concern about her ability to keep the wrap in place for a week she has been prescribed lymphedema pumps but she does not use those either. We will encourage her to use those twice a day for an hour each time. These were prescribed to her by Dr. Hinton Lovely READMISSION 01/04/17; this is a patient who is a type II diabetic on oral agents. I note she was seen in this clinic 2-3 years ago. I was not involved in her care at that point. She tells [name] that she is had ulcers on her lateral left lower extremity since just before Christmas. There was no obvious precipitant to this. She apparently has been on long-term suppressive penicillin prescribed by Dr. 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 She has noted increasing erythema along with all of this and I think this is what prompted her to come in today. She does not have a known history of PAD or neuropathy ABI in this clinic was 1.06 on the left. The patient has not been requiring any compression on her legs although she does have compression stockings at home as well as external compression pumps. She has been using neither of these. She has not been systemically unwell the patient is also known to the local vein and vascular group. Apparently her external compression pumps were previously prescribed by Dr. Delana Meyer. She has had apparent bilateral ablations done in the past although I have none of these records. 01/15/17; the patient returns for rewrap of her left leg last week and reports that was put on too tight, she had to remove it on Sunday. She states this actually occurred her even though the previous 3 lateral wrap she was able to tolerate well. I don't think there is been much in the way of expansion of the erythema on the  lateral left leg lateral left heel and lateral left  foot. As mentioned previously I think this is chronic venous insufficiency and not really an active cellulitis. She has 2 small open areas in the lower left calf 01/22/17; the patient complains of really generalized pain. She has rheumatoid arthritis as well as fibromyalgia. She had to take the wrap off yesterday. She has small open areas on the lateral left heel and foot. 02/01/2017 -- the patient is seen by me today as she's had compression wraps for 10 days and I understand she did not realize she had to come back to see [name]. She did see Dr. Delana Meyer yesterday and his notes are not in, but the patient tells [name] that he is going to recommend a lymphedema pump and has not recommended any further venous workup or intervention. 02/06/17; the patient went to see Dr. Hinton Lovely of vascular surgery although I don't remember specifically being involved with this discussion. He felt she had chronic venous insufficiency without surgery or intervention necessary at this time. Also noted lymphedema and type 2 diabetes. In the meantime she did not tolerate the wrap on the right leg stating that the small wound anteriorly" burns like fire" 02/13/17; the lesions on the left lateral and left medial leg have closed over. Still looks a little vulnerable on the lateral aspect on the left Right; still an open area here but smaller and appears to be well granulated. Using Silver Collegen. She uses her own wrap on the right leg last week and I think week and continue that on both legs this week 02/26/17; the lesion on the left lateral leg and left medial leg are both closed over. Still to open areas on the right medial leg. She is been using her own stockings and the edema control looks adequate 03/12/17 she has no open areas on the left lateral or left medial leg. She still has an open area on the right medial leg and last week we do find an area over the right medial malleolus. She is been to see vascular surgery who did not  recommend any further venous workup. She has lymphedema pumps which she uses although edema doesn't appear to be a big part of her current nonhealing state. She has been using Prisma Readmission: 05-07-2022 upon evaluation today patient presents for initial inspection here in our clinic concerning a surgical wound over the lower abdominal area. She had a panniculectomy which was performed on 02-05-2022 by Dr. Audelia Hives. Initially the surgery with Dr. Marla Roe was successful seemingly until the patient was seen initially on 03-02-2022 for ED to hospital admission due to cellulitis in this area. She subsequently had surgery which was irrigation and debridement with wound VAC placement that was on 03-04-2022. Unfortunately the wound VAC was never really significantly successful and the patient had a second hospital admission on 03-13-2022. She has had visit to the ER since on 04-05-2022. Eventually she decided to put in a referral herself to come in for further evaluation here at the wound center. She is having a hard time getting this to heal and to be honest wanted some help. With that being said she is now outside of her 90-day postop global period which is great news. All things being said I do think this is good to be a fairly complicated process to heal due to the fact that there are some significant tunnel areas at 3:00 and 9:00 along with the depth straight in. The patient is aware that  this is probably get a take quite a bit of time but nonetheless does want help in doing the best thing possible to get it to heal as quickly as possible and I think that we can definitely help with that. Patient does have a history of diabetes her most recent hemoglobin A1c was 7.4 and that was on 03-03-2022. This was a little higher than her previous 2 readings at 3 and 6 months which were at 6.8. Otherwise the patient does have a history of hypertension as well as congestive heart failure as well. She seems to be  doing okay in regard to those issues. 05-14-2022 upon evaluation today patient appears to be doing well currently in regard to her wound although she has not had the [facility] this was not used by home health they said they could not find her on the formulary. I know we have had patients in the past with [facility] did use Hydrofera Blue however. Nonetheless I think that we already need to go that way she states that it seemed to do much better than the Pursifull, Samhita D. ([id number]) alginate which just does not catch and is much of the drainage at this point. Overall I do not see any signs of active infection locally or systemically at this time. Electronic Signature(s) Signed: 05/14/2022 8:34:34 AM By: Worthy Keeler PA-C Entered By: Worthy Keeler on 05/14/2022 08:34:34 [name] [name], [name] ([id number]) -------------------------------------------------------------------------------- Physical Exam Details Patient Name: [name] [name], [name] Date of Service: 05/14/2022 8:00 AM Medical Record Number: [id number] Patient Account Number: [id number] Date of Birth/Sex: 1951-05-19 (71 y.o. F) Treating RN: Carlene Coria Primary Care Provider: Lavon Paganini Other Clinician: Referring Provider: Lavon Paganini Treating Provider/Extender: Skipper Cliche in Treatment: 1 Constitutional Well-nourished and well-hydrated in no acute distress. Respiratory normal breathing without difficulty. Psychiatric this patient is able to make decisions and demonstrates good insight into disease process. Alert and Oriented x 3. pleasant and cooperative. Notes Upon inspection patient's wound bed actually showed signs of good granulation and epithelization at this point. Fortunately I do not see any signs of infection locally or systemically which is great and overall extremely pleased with where we stand today. Electronic Signature(s) Signed: 05/14/2022 8:34:58 AM By: Worthy Keeler PA-C Entered By: Worthy Keeler  on 05/14/2022 08:34:58 [name] [name], [name] ([id number]) -------------------------------------------------------------------------------- Physician Orders Details Patient Name: [name] [name], [name] Date of Service: 05/14/2022 8:00 AM Medical Record Number: [id number] Patient Account Number: [id number] Date of Birth/Sex: 1951-10-13 (71 y.o. F) Treating RN: Carlene Coria Primary Care Provider: Lavon Paganini Other Clinician: Referring Provider: Lavon Paganini Treating Provider/Extender: Skipper Cliche in Treatment: 1 Verbal / Phone Orders: No Diagnosis Coding Follow-up Appointments o Return Appointment in 1 week. [place] for wound care. May utilize formulary equivalent dressing for wound treatment orders unless otherwise specified. Home Health Nurse may visit PRN to address patientos wound care needs. Jackquline [place] [phone] o **Please direct any NON-WOUND related issues/requests for orders to patient's Primary Care Physician. **If current dressing causes regression in wound condition, may D/C ordered dressing product/s and apply Normal Saline Moist Dressing daily until next [place] or Other MD appointment. **Notify Wound Healing Center of regression in wound condition at [phone]. Bathing/ Shower/ Hygiene o May shower; gently cleanse wound with antibacterial soap, rinse and pat dry prior to dressing wounds - on days dressing changed Wound Treatment Wound #9 - Abdomen - Lower Quadrant Wound Laterality: Right Topical: Nystop Nystatin Powder, 15 (g)  bottle 3 x Per Week/30 Days Primary Dressing: Hydrofera Blue Ready Transfer Foam, 4x5 (in/in) 3 x Per Week/30 Days Discharge Instructions: Apply Hydrofera Blue Ready to wound bed as directed Secondary Dressing: ABD Pad 5x9 (in/in) 3 x Per Week/30 Days Discharge Instructions: Cover with ABD pad Secured With: Medipore Tape - 74M Medipore H Soft Cloth Surgical Tape, 2x2 (in/yd) 3 x Per Week/30  Days Electronic Signature(s) Signed: 05/14/2022 3:46:28 PM By: Worthy Keeler PA-C Signed: 05/15/2022 2:49:16 PM By: Carlene Coria RN Entered By: Carlene Coria on 05/14/2022 08:27:06 [name] [name], [name] ([id number]) -------------------------------------------------------------------------------- Problem List Details Patient Name: [name] [name], [name] Date of Service: 05/14/2022 8:00 AM Medical Record Number: [id number] Patient Account Number: [id number] Date of Birth/Sex: 27-Jun-1951 (70 y.o. F) Treating RN: Carlene Coria Primary Care Provider: Lavon Paganini Other Clinician: Referring Provider: Lavon Paganini Treating Provider/Extender: Skipper Cliche in Treatment: 1 Active Problems ICD-10 Encounter Code Description Active Date MDM Diagnosis T81.31XA Disruption of external operation (surgical) wound, not elsewhere 05/07/2022 No Yes classified, initial encounter L02.211 Cutaneous abscess of abdominal wall 05/07/2022 No Yes L98.492 Non-pressure chronic ulcer of skin of other sites with fat layer exposed 05/07/2022 No Yes I50.42 Chronic combined systolic (congestive) and diastolic (congestive) heart 05/07/2022 No Yes failure E11.622 Type 2 diabetes mellitus with other skin ulcer 05/07/2022 No Yes I10 Essential (primary) hypertension 05/07/2022 No Yes Inactive Problems Resolved Problems Electronic Signature(s) Signed: 05/14/2022 8:25:14 AM By: Worthy Keeler PA-C Entered By: Worthy Keeler on 05/14/2022 08:25:13 [name] [name], [name] ([id number]) -------------------------------------------------------------------------------- Progress Note Details Patient Name: [name] [name], [name] Date of Service: 05/14/2022 8:00 AM Medical Record Number: [id number] Patient Account Number: [id number] Date of Birth/Sex: 03/25/1951 (71 y.o. F) Treating RN: Carlene Coria Primary Care Provider: Lavon Paganini Other Clinician: Referring Provider: Lavon Paganini Treating Provider/Extender: Skipper Cliche in  Treatment: 1 Subjective Chief Complaint Information obtained from Patient Reopened/Abscessed Surgical Wound on the Lower Abdominal Wall History of Present Illness (HPI) The following HPI elements were documented for the patient's wound: Location: left lower extremity Severity: improved since her initial hospitalization but stable since Duration: 6 weeks Context: began after her third treatment of Remicade for rheumatoid arthritis Modifying Factors: morbid obesity, chronic venous hypertension, lymphedema, diabetes mellitus type 2, methotrexate use, Remicade use, Associated Signs and Symptoms: pain and swelling left lower extremity she was seen last week where a 2 layer light compression system was applied. She removed that on Sunday because of itching. She states that she did see her primary care physician who has prescribed her a stronger diuretic and she will begin that today. When she removed her dressing she applied some Neosporin and some gauze. There remains considerable concern about her ability to keep the wrap in place for a week she has been prescribed lymphedema pumps but she does not use those either. We will encourage her to use those twice a day for an hour each time. These were prescribed to her by Dr. Hinton Lovely READMISSION 01/04/17; this is a patient who is a type II diabetic on oral agents. I note she was seen in this clinic 2-3 years ago. I was not involved in her care at that point. She tells [name] that she is had ulcers on her lateral left lower extremity since just before Christmas. There was no obvious precipitant to this. She apparently has been on long-term suppressive penicillin prescribed by Dr. 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 She has noted increasing erythema along with all of this and I think this is what prompted her to come in today. She does not have a  known history of PAD or neuropathy ABI in this clinic was 1.06 on the left. The patient has not been requiring any compression on her legs although she does have compression stockings at home as well as external compression pumps. She has been using neither of these. She has not been systemically unwell the patient is also known to the local vein and vascular group. Apparently her external compression pumps were previously prescribed by Dr. Delana Meyer. She has had apparent bilateral ablations done in the past although I have none of these records. 01/15/17; the patient returns for rewrap of her left leg last week and reports that was put on too tight, she had to remove it on Sunday. She states this actually occurred her even though the previous 3 lateral wrap she was able to tolerate well. I don't think there is been much in the way of expansion of the erythema on the lateral left leg lateral left heel and lateral left foot. As mentioned previously I think this is chronic venous insufficiency and not really an active cellulitis. She has 2 small open areas in the lower left calf 01/22/17; the patient complains of really generalized pain. She has rheumatoid arthritis as well as fibromyalgia. She had to take the wrap off yesterday. She has small open areas on the lateral left heel and foot. 02/01/2017 -- the patient is seen by me today as she's had compression wraps for 10 days and I understand she did not realize she had to come back to see [name]. She did see Dr. Delana Meyer yesterday and his notes are not in, but the patient tells [name] that he is going to recommend a lymphedema pump and has not recommended any further venous workup or intervention. 02/06/17; the patient went to see Dr. Hinton Lovely of vascular surgery although I don't remember specifically being involved with this discussion. He felt she had chronic venous insufficiency without surgery or intervention necessary at this time. Also noted lymphedema and  type 2 diabetes. In the meantime she did not tolerate the wrap on the right leg stating that the small wound anteriorly" burns like fire" 02/13/17; the lesions on the left lateral and left medial leg have closed over. Still looks a little vulnerable on the lateral aspect on the left Right; still an open area here but smaller and appears to be well granulated. Using Silver Collegen. She uses her own wrap on the right leg last week and I think week and continue that on both legs this week 02/26/17; the lesion on the left lateral leg and left medial leg are both closed over. Still to open areas on the right medial leg. She is been using her own stockings and the edema control looks adequate 03/12/17 she has no open areas on the left lateral or left medial leg. She still has an open area on the right medial leg and last week we do find an area over the right medial malleolus. She is been to see vascular surgery who did not recommend any further venous workup. She has lymphedema pumps which she uses although edema doesn't appear to be a big part of her current nonhealing state. She has been using Prisma Readmission: 05-07-2022 upon evaluation today patient presents for initial inspection here in our clinic concerning a surgical wound over the lower abdominal  area. She had a panniculectomy which was performed on 02-05-2022 by Dr. Audelia Hives. Initially the surgery with Dr. Marla Roe was successful seemingly until the patient was seen initially on 03-02-2022 for ED to hospital admission due to cellulitis in this area. She subsequently had surgery which was irrigation and debridement with wound VAC placement that was on 03-04-2022. Unfortunately the wound VAC was never really significantly successful and the patient had a second hospital admission on 03-13-2022. She has had visit to the ER since on 04-05-2022. Eventually she decided to put in a referral herself to come in for further evaluation here at the wound  center. She is having a hard time getting this to heal and to be honest wanted some help. With that being said she is now outside of her 90-day postop global period which is great news. All things being said I do think this is good to be a fairly complicated process to heal due to the fact that there are some significant tunnel areas at 3:00 and 9:00 along with the depth straight in. The patient is aware that this is probably get a take quite a bit of time but nonetheless does want help in doing the best thing possible to get it to heal as quickly as possible and I think that we can definitely help with that. [name] [name], [name] ([id number]) Patient does have a history of diabetes her most recent hemoglobin A1c was 7.4 and that was on 03-03-2022. This was a little higher than her previous 2 readings at 3 and 6 months which were at 6.8. Otherwise the patient does have a history of hypertension as well as congestive heart failure as well. She seems to be doing okay in regard to those issues. 05-14-2022 upon evaluation today patient appears to be doing well currently in regard to her wound although she has not had the [facility] this was not used by home health they said they could not find her on the formulary. I know we have had patients in the past with [facility] did use Hydrofera Blue however. Nonetheless I think that we already need to go that way she states that it seemed to do much better than the alginate which just does not catch and is much of the drainage at this point. Overall I do not see any signs of active infection locally or systemically at this time. Objective Constitutional Well-nourished and well-hydrated in no acute distress. Vitals Time Taken: 8:16 AM, Height: 67 in, Weight: 230 lbs, BMI: 36, Temperature: 98.5 F, Pulse: 71 bpm, Respiratory Rate: 16 breaths/min, Blood Pressure: 134/75 mmHg. Respiratory normal breathing without difficulty. Psychiatric this patient is able to make  decisions and demonstrates good insight into disease process. Alert and Oriented x 3. pleasant and cooperative. General Notes: Upon inspection patient's wound bed actually showed signs of good granulation and epithelization at this point. Fortunately I do not see any signs of infection locally or systemically which is great and overall extremely pleased with where we stand today. Integumentary (Hair, Skin) Wound #9 status is Open. Original cause of wound was Gradually Appeared. The date acquired was: 01/24/2022. The wound has been in treatment 1 weeks. The wound is located on the Right Abdomen - Lower Quadrant. The wound measures 0.5cm length x 2.5cm width x 2cm depth; 0.982cm^2 area and 1.963cm^3 volume. There is Fat Layer (Subcutaneous Tissue) exposed. There is no undermining noted, however, there is tunneling at 3:00 with a maximum distance of 6cm. There is a large amount  of serosanguineous drainage noted. There is large (67-100%) red granulation within the wound bed. There is no necrotic tissue within the wound bed. 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 Home Health: [facility] for wound care. May utilize formulary equivalent dressing for wound treatment orders unless otherwise specified. Home Health Nurse may visit PRN to address patient s wound care needs. Jackquline [place] [phone] **Please direct any NON-WOUND related issues/requests for orders to patient's Primary Care Physician. **If current dressing causes regression in wound condition, may D/C ordered dressing  product/s and apply Normal Saline Moist Dressing daily until next [place] or Other MD appointment. **Notify Wound Healing Center of regression in wound condition at [phone]. Bathing/ Shower/ Hygiene: [name] [name], [name] [name] AUTHIER ([id number]) May shower; gently cleanse wound with antibacterial soap, rinse and pat dry prior to dressing wounds - on days dressing changed WOUND #9: - Abdomen - Lower Quadrant Wound Laterality: Right Topical: Nystop Nystatin Powder, 15 (g) bottle 3 x Per Week/30 Days Primary Dressing: Hydrofera Blue Ready Transfer Foam, 4x5 (in/in) 3 x Per Week/30 Days Discharge Instructions: Apply Hydrofera Blue Ready to wound bed as directed Secondary Dressing: ABD Pad 5x9 (in/in) 3 x Per Week/30 Days Discharge Instructions: Cover with ABD pad Secured With: Medipore Tape - 87M Medipore H Soft Cloth Surgical Tape, 2x2 (in/yd) 3 x Per Week/30 Days 1. Would recommend currently that we go ahead and continue with the [facility] I think this is still good to be the best way to go. 2. Also can recommend a ABD pad to cover. 3. I would also suggest that the patient continue with the nystatin powder around the edges of the wound to help with any rash. 4. We are going to contact [facility] and try to see wound about working out get the [employer] the alginate just is not doing nearly as well it does not absorb as much as the blue does. We will see patient back for reevaluation in 1 week here in the clinic. If anything worsens or changes patient will contact our office for additional recommendations. Electronic Signature(s) Signed: 05/14/2022 8:36:16 AM By: Worthy Keeler PA-C Entered By: Worthy Keeler on 05/14/2022 08:36:16 Crookshanks, Jahniah D. ([id number]) -------------------------------------------------------------------------------- SuperBill Details Patient Name: Jost, Britaney D. Date of Service: 05/14/2022 Medical Record Number: [id number] Patient Account Number: [id number] Date  of Birth/Sex: 01-07-1951 (71 y.o. 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 with other skin ulcer I10 Essential (primary) hypertension Physician Procedures CPT4 Code: 6606004 Description: 99213 - WC PHYS LEVEL 3 - EST PT Modifier: Quantity: 1 CPT4 Code: Description: ICD-10 Diagnosis Description T81.31XA Disruption of external operation (surgical) wound, not elsewhere classifi L02.211 Cutaneous abscess of abdominal wall L98.492 Non-pressure chronic ulcer of skin of other sites with fat layer exposed  I50.42 Chronic combined  systolic (congestive) and diastolic (congestive) heart f Modifier: ed, initial encounter ailure Quantity: Electronic Signature(s) Signed: 05/14/2022 8:36:27 AM By: Worthy Keeler PA-C Entered By: Worthy Keeler on 05/14/2022 08:36:26

## 2022-05-15 NOTE — Progress Notes (Deleted)
      Established patient visit   Patient: [name] [name]   DOB: 11-24-1951   71 y.o. Female  MRN: [id number] Visit Date: 05/17/2022  Today's healthcare provider: Lavon Paganini, MD   No chief complaint on file.  Subjective    HPI  Depression, Follow-up  She  was last seen for this 1 weeks ago. Changes made at last visit include referral was placed after reviewing the note from the triage nurse on 06/07.   She reports {excellent/good/fair/poor:19665} compliance with treatment. She {is/is not:21021397} having side effects. ***  She reports {DESC; GOOD/FAIR/POOR:18685} tolerance of treatment. Current symptoms include: {Symptoms; depression:1002} She feels she is {improved/worse/unchanged:3041574} since last visit.     04/19/2022   11:11 AM 04/02/2022   11:01 AM 01/18/2022    8:46 AM  Depression screen PHQ 2/9  Decreased Interest 3 1 0  Down, Depressed, Hopeless 3 1 0  PHQ - 2 Score 6 2 0  Altered sleeping 0 1 0  Tired, decreased energy 1 0 0  Change in appetite 1 1 0  Feeling bad or failure about yourself  3 0 0  Trouble concentrating 3 1 0  Moving slowly or fidgety/restless 0 0 0  Suicidal thoughts 1 0 0  PHQ-9 Score 15 5 0  Difficult doing work/chores Somewhat difficult Not difficult at all Not difficult at all    -----------------------------------------------------------------------------------------   Medications: Outpatient Medications Prior to Visit  Medication Sig   acetaminophen (TYLENOL) 500 MG tablet Take 1,000 mg by mouth every 6 (six) hours as needed for mild pain.   Biotin w/ Vitamins C & E (HAIR/SKIN/NAILS PO) Take 1 tablet by mouth daily.   buPROPion (WELLBUTRIN XL) 300 MG 24 hr tablet NEW PRESCRIPTION REQUEST: TAKE ONE TABLET BY MOUTH DAILY   carvedilol (COREG) 6.25 MG tablet NEW PRESCRIPTION REQUEST: TAKE ONE TABLET BY MOUTH TWICE DAILY   DULoxetine (CYMBALTA) 60 MG capsule NEW PRESCRIPTION REQUEST: TAKE ONE CAPSULE BY MOUTH DAILY   ezetimibe  (ZETIA) 10 MG tablet TAKE 1 TABLET BY MOUTH EVERY DAY (Patient taking differently: Take 10 mg by mouth daily.)   GEMTESA 75 MG TABS Take 1 tablet by mouth daily.   losartan (COZAAR) 25 MG tablet Take 1 tablet (25 mg total) by mouth daily.   meloxicam (MOBIC) 15 MG tablet TAKE 1 TABLET (15 MG TOTAL) BY MOUTH DAILY.   metFORMIN (GLUCOPHAGE) 500 MG tablet NEW PRESCRIPTION REQUEST: TAKE ONE TABLET BY MOUTH TWICE DAILY   polyethylene glycol (MIRALAX / GLYCOLAX) 17 g packet Take 17 g by mouth daily as needed for mild constipation.   pregabalin (LYRICA) 75 MG capsule Take 1 capsule (75 mg total) by mouth 3 (three) times daily.   tolterodine (DETROL LA) 4 MG 24 hr capsule Take 4 mg by mouth daily.   No facility-administered medications prior to visit.    Review of Systems  {Labs  Heme  Chem  Endocrine  Serology  Results Review (optional):23779}   Objective    LMP  (LMP Unknown) Comment: age 54 {Show previous vital signs (optional):23777}  Physical Exam  ***  No results found for any visits on 05/17/22.  Assessment & Plan     ***  No follow-ups on file.      {provider attestation***:1}   Lavon Paganini, MD  [facility] [phone] (phone) [phone] (fax)  [place]

## 2022-05-15 NOTE — Progress Notes (Signed)
Mangano, LINAH KLAPPER ([id number]) Visit Report for 05/14/2022 Arrival Information Details Patient Name: [name] [name], [name] Date of Service: 05/14/2022 8:00 AM Medical Record Number: [id number] Patient Account Number: [id number] Date of Birth/Sex: [birth year] (71 y.o. F) Treating RN: Carlene Coria Primary Care Keriann Rankin: Lavon Paganini Other Clinician: Referring Avanna Sowder: Lavon Paganini Treating Finnley Lewis/Extender: Skipper Cliche in Treatment: 1 Visit Information History Since Last Visit All ordered tests and consults were completed: No Patient Arrived: Ambulatory Added or deleted any medications: No Arrival Time: 08:12 Any new allergies or adverse reactions: No Accompanied By: self Had a fall or experienced change in No Transfer Assistance: None activities of daily living that may affect Patient Identification Verified: Yes risk of falls: Secondary Verification Process Completed: Yes Signs or symptoms of abuse/neglect since last visito No Patient Requires Transmission-Based Precautions: No Hospitalized since last visit: No Patient Has Alerts: Yes Implantable device outside of the clinic excluding No Patient Alerts: DIABETIC cellular tissue based products placed in the center since last visit: Pain Present Now: No Electronic Signature(s) Signed: 05/15/2022 2:49:16 PM By: Carlene Coria RN Entered By: Carlene Coria on 05/14/2022 08:16:02 Nave, Emanuel D. ([id number]) -------------------------------------------------------------------------------- Clinic Level of Care Assessment Details Patient Name: Rymer, Lyndee D. Date of Service: 05/14/2022 8:00 AM Medical Record Number: [id number] Patient Account Number: [id number] Date of Birth/Sex: [birth year] (71 y.o. F) Treating RN: Carlene Coria Primary Care Cherrell Maybee: Lavon Paganini Other Clinician: Referring Rovena Hearld: Lavon Paganini Treating Jager Koska/Extender: Skipper Cliche in Treatment: 1 Clinic Level of Care Assessment  Items TOOL 4 Quantity Score X - Use when only an EandM is performed on FOLLOW-UP visit 1 0 ASSESSMENTS - Nursing Assessment / Reassessment X - Reassessment of Co-morbidities (includes updates in patient status) 1 10 X- 1 5 Reassessment of Adherence to Treatment Plan ASSESSMENTS - Wound and Skin Assessment / Reassessment X - Simple Wound Assessment / Reassessment - one wound 1 5 '[]'$  - 0 Complex Wound Assessment / Reassessment - multiple wounds '[]'$  - 0 Dermatologic / Skin Assessment (not related to wound area) ASSESSMENTS - Focused Assessment '[]'$  - Circumferential Edema Measurements - multi extremities 0 '[]'$  - 0 Nutritional Assessment / Counseling / Intervention '[]'$  - 0 Lower Extremity Assessment (monofilament, tuning fork, pulses) '[]'$  - 0 Peripheral Arterial Disease Assessment (using hand held doppler) ASSESSMENTS - Ostomy and/or Continence Assessment and Care '[]'$  - Incontinence Assessment and Management 0 '[]'$  - 0 Ostomy Care Assessment and Management (repouching, etc.) PROCESS - Coordination of Care X - Simple Patient / Family Education for ongoing care 1 15 '[]'$  - 0 Complex (extensive) Patient / Family Education for ongoing care '[]'$  - 0 Staff obtains Programmer, systems, Records, Test Results / Process Orders '[]'$  - 0 Staff telephones HHA, Nursing Homes / Clarify orders / etc '[]'$  - 0 Routine Transfer to another Facility (non-emergent condition) '[]'$  - 0 Routine Hospital Admission (non-emergent condition) '[]'$  - 0 New Admissions / Biomedical engineer / Ordering NPWT, Apligraf, etc. '[]'$  - 0 Emergency Hospital Admission (emergent condition) X- 1 10 Simple Discharge Coordination '[]'$  - 0 Complex (extensive) Discharge Coordination PROCESS - Special Needs '[]'$  - Pediatric / Minor Patient Management 0 '[]'$  - 0 Isolation Patient Management '[]'$  - 0 Hearing / Language / Visual special needs '[]'$  - 0 Assessment of Community assistance (transportation, D/C planning, etc.) '[]'$  - 0 Additional assistance /  Altered mentation '[]'$  - 0 Support Surface(s) Assessment (bed, cushion, seat, etc.) INTERVENTIONS - Wound Cleansing / Measurement Abruzzese, Miosha D. ([id number]) X- 1 5 Simple Wound Cleansing - one wound '[]'$  -  0 Complex Wound Cleansing - multiple wounds X- 1 5 Wound Imaging (photographs - any number of wounds) '[]'$  - 0 Wound Tracing (instead of photographs) X- 1 5 Simple Wound Measurement - one wound '[]'$  - 0 Complex Wound Measurement - multiple wounds INTERVENTIONS - Wound Dressings X - Small Wound Dressing one or multiple wounds 1 10 '[]'$  - 0 Medium Wound Dressing one or multiple wounds '[]'$  - 0 Large Wound Dressing one or multiple wounds X- 1 5 Application of Medications - topical '[]'$  - 0 Application of Medications - injection INTERVENTIONS - Miscellaneous '[]'$  - External ear exam 0 '[]'$  - 0 Specimen Collection (cultures, biopsies, blood, body fluids, etc.) '[]'$  - 0 Specimen(s) / Culture(s) sent or taken to Lab for analysis '[]'$  - 0 Patient Transfer (multiple staff / Civil Service fast streamer / Similar devices) '[]'$  - 0 Simple Staple / Suture removal (25 or less) '[]'$  - 0 Complex Staple / Suture removal (26 or more) '[]'$  - 0 Hypo / Hyperglycemic Management (close monitor of Blood Glucose) '[]'$  - 0 Ankle / Brachial Index (ABI) - do not check if billed separately X- 1 5 Vital Signs Has the patient been seen at the hospital within the last three years: Yes Total Score: 80 Level Of Care: New/Established - Level 3 Electronic Signature(s) Signed: 05/15/2022 2:49:16 PM By: Carlene Coria RN Entered By: Carlene Coria on 05/14/2022 15:26:17 Shaver, Terry D. ([id number]) -------------------------------------------------------------------------------- Encounter Discharge Information Details Patient Name: Trusty, Kyllie D. Date of Service: 05/14/2022 8:00 AM Medical Record Number: [id number] Patient Account Number: [id number] Date of Birth/Sex: 08-10-1951 (71 y.o. F) Treating RN: Carlene Coria Primary Care Ashtin Rosner:  Lavon Paganini Other Clinician: Referring Ran Tullis: Lavon Paganini Treating Khamora Karan/Extender: Skipper Cliche in Treatment: 1 Encounter Discharge Information Items Discharge Condition: Stable Ambulatory Status: Ambulatory Discharge Destination: Home Transportation: Private Auto Accompanied By: self Schedule Follow-up Appointment: Yes Clinical Summary of Care: Patient Declined Electronic Signature(s) Signed: 05/14/2022 8:45:11 AM By: Carlene Coria RN Entered By: Carlene Coria on 05/14/2022 08:45:11 Anton, Tashanti D. ([id number]) -------------------------------------------------------------------------------- Lower Extremity Assessment Details Patient Name: Ealy, Gael D. Date of Service: 05/14/2022 8:00 AM Medical Record Number: [id number] Patient Account Number: [id number] Date of Birth/Sex: 12/20/1950 (71 y.o. F) Treating RN: Carlene Coria Primary Care Jolita Haefner: Lavon Paganini Other Clinician: Referring Phuong Moffatt: Lavon Paganini Treating Citlally Captain/Extender: Skipper Cliche in Treatment: 1 Electronic Signature(s) Signed: 05/15/2022 2:49:16 PM By: Carlene Coria RN Entered By: Carlene Coria on 05/14/2022 08:22:56 Shoberg, Markesia D. ([id number]) -------------------------------------------------------------------------------- Multi Wound Chart Details Patient Name: Mandigo, Alonda D. Date of Service: 05/14/2022 8:00 AM Medical Record Number: [id number] Patient Account Number: [id number] Date of Birth/Sex: Feb 24, 1951 (71 y.o. F) Treating RN: Carlene Coria Primary Care Aissata Wilmore: Lavon Paganini Other Clinician: Referring Tytionna Cloyd: Lavon Paganini Treating Alishba Naples/Extender: Skipper Cliche in Treatment: 1 Vital Signs Height(in): 67 Pulse(bpm): 36 Weight(lbs): 230 Blood Pressure(mmHg): 134/75 Body Mass Index(BMI): 36 Temperature(F): 98.5 Respiratory Rate(breaths/min): 16 Photos: [N/A:N/A] Wound Location: Right Abdomen - Lower Quadrant N/A N/A Wounding Event:  Gradually Appeared N/A N/A Primary Etiology: Abscess N/A N/A Comorbid History: Lymphedema, Congestive Heart N/A N/A Failure, Coronary Artery Disease, Hypertension, Type II Diabetes, Rheumatoid Arthritis, Osteoarthritis, Confinement Anxiety Date Acquired: 01/24/2022 N/A N/A Weeks of Treatment: 1 N/A N/A Wound Status: Open N/A N/A Wound Recurrence: No N/A N/A Measurements L x W x D (cm) 0.5x2.5x2 N/A N/A Area (cm) : 0.982 N/A N/A Volume (cm) : 1.963 N/A N/A % Reduction in Area: 16.60% N/A N/A % Reduction in Volume: 24.30% N/A N/A Position 1 (o'clock): 3 Maximum Distance  1 (cm): 6 Tunneling: Yes N/A N/A Classification: Full Thickness Without Exposed N/A N/A Support Structures Exudate Amount: Large N/A N/A Exudate Type: Serosanguineous N/A N/A Exudate Color: red, brown N/A N/A Granulation Amount: Large (67-100%) N/A N/A Granulation Quality: Red N/A N/A Necrotic Amount: None Present (0%) N/A N/A Exposed Structures: Fat Layer (Subcutaneous Tissue): N/A N/A Yes Fascia: No Tendon: No Muscle: No Joint: No Bone: No Epithelialization: None N/A N/A Treatment Notes Electronic Signature(s) Sando, BRETTANY SYDNEY ([id number]) Signed: 05/15/2022 2:49:16 PM By: Carlene Coria RN Entered By: Carlene Coria on 05/14/2022 08:23:09 Hapke, Pedro D. ([id number]) -------------------------------------------------------------------------------- Multi-Disciplinary Care Plan Details Patient Name: Pinnix, Prisila D. Date of Service: 05/14/2022 8:00 AM Medical Record Number: [id number] Patient Account Number: [id number] Date of Birth/Sex: [birth year] (71 y.o. F) Treating RN: Carlene Coria Primary Care Hira Trent: Lavon Paganini Other Clinician: Referring Rickardo Brinegar: Lavon Paganini Treating Zeferino Mounts/Extender: Skipper Cliche in Treatment: 1 Active Inactive Nutrition Nursing Diagnoses: Potential for alteratiion in Nutrition/Potential for imbalanced nutrition Goals: Patient/caregiver verbalizes  understanding of need to maintain therapeutic glucose control per primary care physician Date Initiated: 05/07/2022 Target Resolution Date: 06/06/2022 Goal Status: Active Interventions: Assess HgA1c results as ordered upon admission and as needed Assess patient nutrition upon admission and as needed per policy Notes: Wound/Skin Impairment Nursing Diagnoses: Knowledge deficit related to ulceration/compromised skin integrity Goals: Patient/caregiver will verbalize understanding of skin care regimen Date Initiated: 05/07/2022 Target Resolution Date: 06/06/2022 Goal Status: Active Ulcer/skin breakdown will have a volume reduction of 30% by week 4 Date Initiated: 05/07/2022 Target Resolution Date: 06/06/2022 Goal Status: Active Ulcer/skin breakdown will have a volume reduction of 50% by week 8 Date Initiated: 05/07/2022 Target Resolution Date: 07/07/2022 Goal Status: Active Ulcer/skin breakdown will have a volume reduction of 80% by week 12 Date Initiated: 05/07/2022 Target Resolution Date: 08/07/2022 Goal Status: Active Ulcer/skin breakdown will heal within 14 weeks Date Initiated: 05/07/2022 Target Resolution Date: 09/06/2022 Goal Status: Active Interventions: Assess patient/caregiver ability to obtain necessary supplies Assess patient/caregiver ability to perform ulcer/skin care regimen upon admission and as needed Assess ulceration(s) every visit Notes: Electronic Signature(s) Signed: 05/15/2022 2:49:16 PM By: Carlene Coria RN Entered By: Carlene Coria on 05/14/2022 08:23:01 Alonge, Tiasha D. ([id number]) -------------------------------------------------------------------------------- Pain Assessment Details Patient Name: Jaquith, Geneva D. Date of Service: 05/14/2022 8:00 AM Medical Record Number: [id number] Patient Account Number: [id number] Date of Birth/Sex: 1950/12/05 (70 y.o. F) Treating RN: Carlene Coria Primary Care Bryn Perkin: Lavon Paganini Other Clinician: Referring Shawnelle Spoerl: Lavon Paganini Treating Dresean Beckel/Extender: Skipper Cliche in Treatment: 1 Active Problems Location of Pain Severity and Description of Pain Patient Has Paino No Site Locations Pain Management and Medication Current Pain Management: Electronic Signature(s) Signed: 05/15/2022 2:49:16 PM By: Carlene Coria RN Entered By: Carlene Coria on 05/14/2022 08:16:27 Korinek, Tomeika D. ([id number]) -------------------------------------------------------------------------------- Patient/Caregiver Education Details Patient Name: Ochsner, Mccayla D. Date of Service: 05/14/2022 8:00 AM Medical Record Number: [id number] Patient Account Number: [id number] Date of Birth/Gender: 12-11-1950 (71 y.o. F) Treating RN: Carlene Coria Primary Care Physician: Lavon Paganini Other Clinician: Referring Physician: Lavon Paganini Treating Physician/Extender: Skipper Cliche in Treatment: 1 Education Assessment Education Provided To: Patient Education Topics Provided Wound/Skin Impairment: Methods: Explain/Verbal Responses: State content correctly Electronic Signature(s) Signed: 05/15/2022 2:49:16 PM By: Carlene Coria RN Entered By: Carlene Coria on 05/14/2022 08:43:54 Wigley, Dymon D. ([id number]) -------------------------------------------------------------------------------- Wound Assessment Details Patient Name: Ullman, Trinady D. Date of Service: 05/14/2022 8:00 AM Medical Record Number: [id number] Patient Account Number: [id number] Date of Birth/Sex: 1951/07/28 (71 y.o. F) Treating RN: Carlene Coria Primary Care  Loretta Kluender: Lavon Paganini Other Clinician: Referring Cloys Vera: Lavon Paganini Treating Ausha Sieh/Extender: Skipper Cliche in Treatment: 1 Wound Status Wound Number: 9 Primary Abscess Etiology: Wound Location: Right Abdomen - Lower Quadrant Wound Open Wounding Event: Gradually Appeared Status: Date Acquired: 01/24/2022 Comorbid Lymphedema, Congestive Heart Failure, Coronary Artery Weeks Of  Treatment: 1 History: Disease, Hypertension, Type II Diabetes, Rheumatoid Clustered Wound: No Arthritis, Osteoarthritis, Confinement Anxiety Photos Wound Measurements Length: (cm) 0.5 Width: (cm) 2.5 Depth: (cm) 2 Area: (cm) 0.982 Volume: (cm) 1.963 % Reduction in Area: 16.6% % Reduction in Volume: 24.3% Epithelialization: None Tunneling: Yes Position (o'clock): 3 Maximum Distance: (cm) 6 Undermining: No Wound Description Classification: Full Thickness Without Exposed Support Structu Exudate Amount: Large Exudate Type: Serosanguineous Exudate Color: red, brown res Foul Odor After Cleansing: No Slough/Fibrino No Wound Bed Granulation Amount: Large (67-100%) Exposed Structure Granulation Quality: Red Fascia Exposed: No Necrotic Amount: None Present (0%) Fat Layer (Subcutaneous Tissue) Exposed: Yes Tendon Exposed: No Muscle Exposed: No Joint Exposed: No Bone Exposed: No Treatment Notes Wound #9 (Abdomen - Lower Quadrant) Wound Laterality: Right Cleanser Peri-Wound Care Penson, Romanita D. ([id number]) Topical Nystop Nystatin Powder, 15 (g) bottle Primary Dressing Hydrofera Blue Ready Transfer Foam, 4x5 (in/in) Discharge Instruction: Apply Hydrofera Blue Ready to wound bed as directed Secondary Dressing ABD Pad 5x9 (in/in) Discharge Instruction: Cover with ABD pad Secured With Medipore Tape - 52M Medipore H Soft Cloth Surgical Tape, 2x2 (in/yd) Compression Wrap Compression Stockings Add-Ons Electronic Signature(s) Signed: 05/15/2022 2:49:16 PM By: Carlene Coria RN Entered By: Carlene Coria on 05/14/2022 08:22:13 Cure, Marni D. ([id number]) -------------------------------------------------------------------------------- Vitals Details Patient Name: Mcguffee, Cinderella D. Date of Service: 05/14/2022 8:00 AM Medical Record Number: [id number] Patient Account Number: [id number] Date of Birth/Sex: Jul 03, 1951 (71 y.o. F) Treating RN: Carlene Coria Primary Care Rylyn Ranganathan:  Lavon Paganini Other Clinician: Referring Cherree Conerly: Lavon Paganini Treating Aquarius Tremper/Extender: Skipper Cliche in Treatment: 1 Vital Signs Time Taken: 08:16 Temperature (F): 98.5 Height (in): 67 Pulse (bpm): 71 Weight (lbs): 230 Respiratory Rate (breaths/min): 16 Body Mass Index (BMI): 36 Blood Pressure (mmHg): 134/75 Reference Range: 80 - 120 mg / dl Electronic Signature(s) Signed: 05/15/2022 2:49:16 PM By: Carlene Coria RN Entered By: Carlene Coria on 05/14/2022 08:16:20

## 2022-05-16 ENCOUNTER — Encounter: Payer: Self-pay | Admitting: Family Medicine

## 2022-05-16 DIAGNOSIS — F32A Depression, unspecified: Secondary | ICD-10-CM | POA: Diagnosis not present

## 2022-05-16 DIAGNOSIS — M48061 Spinal stenosis, lumbar region without neurogenic claudication: Secondary | ICD-10-CM | POA: Diagnosis not present

## 2022-05-16 DIAGNOSIS — E119 Type 2 diabetes mellitus without complications: Secondary | ICD-10-CM | POA: Diagnosis not present

## 2022-05-16 DIAGNOSIS — M545 Low back pain, unspecified: Secondary | ICD-10-CM | POA: Diagnosis not present

## 2022-05-16 DIAGNOSIS — Z96652 Presence of left artificial knee joint: Secondary | ICD-10-CM | POA: Diagnosis not present

## 2022-05-16 DIAGNOSIS — M797 Fibromyalgia: Secondary | ICD-10-CM | POA: Diagnosis not present

## 2022-05-16 DIAGNOSIS — Z9181 History of falling: Secondary | ICD-10-CM | POA: Diagnosis not present

## 2022-05-16 DIAGNOSIS — I509 Heart failure, unspecified: Secondary | ICD-10-CM | POA: Diagnosis not present

## 2022-05-16 DIAGNOSIS — M069 Rheumatoid arthritis, unspecified: Secondary | ICD-10-CM | POA: Diagnosis not present

## 2022-05-16 DIAGNOSIS — E785 Hyperlipidemia, unspecified: Secondary | ICD-10-CM | POA: Diagnosis not present

## 2022-05-16 DIAGNOSIS — I2510 Atherosclerotic heart disease of native coronary artery without angina pectoris: Secondary | ICD-10-CM | POA: Diagnosis not present

## 2022-05-16 DIAGNOSIS — Z87891 Personal history of nicotine dependence: Secondary | ICD-10-CM | POA: Diagnosis not present

## 2022-05-16 DIAGNOSIS — I110 Hypertensive heart disease with heart failure: Secondary | ICD-10-CM | POA: Diagnosis not present

## 2022-05-16 DIAGNOSIS — T8149XD Infection following a procedure, other surgical site, subsequent encounter: Secondary | ICD-10-CM | POA: Diagnosis not present

## 2022-05-16 DIAGNOSIS — G8929 Other chronic pain: Secondary | ICD-10-CM | POA: Diagnosis not present

## 2022-05-16 DIAGNOSIS — Z7984 Long term (current) use of oral hypoglycemic drugs: Secondary | ICD-10-CM | POA: Diagnosis not present

## 2022-05-16 DIAGNOSIS — M1990 Unspecified osteoarthritis, unspecified site: Secondary | ICD-10-CM | POA: Diagnosis not present

## 2022-05-16 NOTE — Patient Instructions (Incomplete)
   The CDC recommends two doses of Shingrix (the shingles vaccine) separated by 2 to 6 months for adults age 69 years and older. I recommend checking with your insurance plan regarding coverage for this vaccine.   

## 2022-05-17 ENCOUNTER — Ambulatory Visit: Payer: Medicare Other | Admitting: Family Medicine

## 2022-05-18 DIAGNOSIS — M48061 Spinal stenosis, lumbar region without neurogenic claudication: Secondary | ICD-10-CM | POA: Diagnosis not present

## 2022-05-18 DIAGNOSIS — E785 Hyperlipidemia, unspecified: Secondary | ICD-10-CM | POA: Diagnosis not present

## 2022-05-18 DIAGNOSIS — G8929 Other chronic pain: Secondary | ICD-10-CM | POA: Diagnosis not present

## 2022-05-18 DIAGNOSIS — Z87891 Personal history of nicotine dependence: Secondary | ICD-10-CM | POA: Diagnosis not present

## 2022-05-18 DIAGNOSIS — I2510 Atherosclerotic heart disease of native coronary artery without angina pectoris: Secondary | ICD-10-CM | POA: Diagnosis not present

## 2022-05-18 DIAGNOSIS — I110 Hypertensive heart disease with heart failure: Secondary | ICD-10-CM | POA: Diagnosis not present

## 2022-05-18 DIAGNOSIS — M1990 Unspecified osteoarthritis, unspecified site: Secondary | ICD-10-CM | POA: Diagnosis not present

## 2022-05-18 DIAGNOSIS — M545 Low back pain, unspecified: Secondary | ICD-10-CM | POA: Diagnosis not present

## 2022-05-18 DIAGNOSIS — Z96652 Presence of left artificial knee joint: Secondary | ICD-10-CM | POA: Diagnosis not present

## 2022-05-18 DIAGNOSIS — T8149XD Infection following a procedure, other surgical site, subsequent encounter: Secondary | ICD-10-CM | POA: Diagnosis not present

## 2022-05-18 DIAGNOSIS — M069 Rheumatoid arthritis, unspecified: Secondary | ICD-10-CM | POA: Diagnosis not present

## 2022-05-18 DIAGNOSIS — F32A Depression, unspecified: Secondary | ICD-10-CM | POA: Diagnosis not present

## 2022-05-18 DIAGNOSIS — Z9181 History of falling: Secondary | ICD-10-CM | POA: Diagnosis not present

## 2022-05-18 DIAGNOSIS — E119 Type 2 diabetes mellitus without complications: Secondary | ICD-10-CM | POA: Diagnosis not present

## 2022-05-18 DIAGNOSIS — M797 Fibromyalgia: Secondary | ICD-10-CM | POA: Diagnosis not present

## 2022-05-18 DIAGNOSIS — I509 Heart failure, unspecified: Secondary | ICD-10-CM | POA: Diagnosis not present

## 2022-05-18 DIAGNOSIS — Z7984 Long term (current) use of oral hypoglycemic drugs: Secondary | ICD-10-CM | POA: Diagnosis not present

## 2022-05-18 NOTE — Progress Notes (Deleted)
      Established patient visit   Patient: [name] [name]   DOB: Mar 27, 1951   71 y.o. Female  MRN: [id number] Visit Date: 05/21/2022  Today's healthcare provider: Lavon Paganini, MD   No chief complaint on file.  Subjective    HPI  ***  Medications: Outpatient Medications Prior to Visit  Medication Sig   acetaminophen (TYLENOL) 500 MG tablet Take 1,000 mg by mouth every 6 (six) hours as needed for mild pain.   Biotin w/ Vitamins C & E (HAIR/SKIN/NAILS PO) Take 1 tablet by mouth daily.   buPROPion (WELLBUTRIN XL) 300 MG 24 hr tablet NEW PRESCRIPTION REQUEST: TAKE ONE TABLET BY MOUTH DAILY   carvedilol (COREG) 6.25 MG tablet NEW PRESCRIPTION REQUEST: TAKE ONE TABLET BY MOUTH TWICE DAILY   DULoxetine (CYMBALTA) 60 MG capsule NEW PRESCRIPTION REQUEST: TAKE ONE CAPSULE BY MOUTH DAILY   ezetimibe (ZETIA) 10 MG tablet TAKE 1 TABLET BY MOUTH EVERY DAY (Patient taking differently: Take 10 mg by mouth daily.)   GEMTESA 75 MG TABS Take 1 tablet by mouth daily.   losartan (COZAAR) 25 MG tablet Take 1 tablet (25 mg total) by mouth daily.   meloxicam (MOBIC) 15 MG tablet TAKE 1 TABLET (15 MG TOTAL) BY MOUTH DAILY.   metFORMIN (GLUCOPHAGE) 500 MG tablet NEW PRESCRIPTION REQUEST: TAKE ONE TABLET BY MOUTH TWICE DAILY   polyethylene glycol (MIRALAX / GLYCOLAX) 17 g packet Take 17 g by mouth daily as needed for mild constipation.   pregabalin (LYRICA) 75 MG capsule Take 1 capsule (75 mg total) by mouth 3 (three) times daily.   tolterodine (DETROL LA) 4 MG 24 hr capsule Take 4 mg by mouth daily.   No facility-administered medications prior to visit.    Review of Systems  {Labs  Heme  Chem  Endocrine  Serology  Results Review (optional):23779}   Objective    LMP  (LMP Unknown) Comment: age 9 {Show previous vital signs (optional):23777}  Physical Exam  ***  No results found for any visits on 05/21/22.  Assessment & Plan     ***  No follow-ups on file.      {provider  attestation***:1}   Lavon Paganini, MD  [facility] [phone] (phone) [phone] (fax)  [place]

## 2022-05-21 ENCOUNTER — Ambulatory Visit: Payer: Medicare Other | Admitting: Family Medicine

## 2022-05-21 ENCOUNTER — Other Ambulatory Visit
Admission: RE | Admit: 2022-05-21 | Discharge: 2022-05-21 | Disposition: A | Discharge status: Home / Self Care | Payer: Medicare Other | Source: Ambulatory Visit | Attending: Physician Assistant | Admitting: Physician Assistant

## 2022-05-21 ENCOUNTER — Encounter: Payer: Medicare Other | Admitting: Physician Assistant

## 2022-05-21 DIAGNOSIS — I110 Hypertensive heart disease with heart failure: Secondary | ICD-10-CM | POA: Diagnosis not present

## 2022-05-21 DIAGNOSIS — B999 Unspecified infectious disease: Secondary | ICD-10-CM | POA: Diagnosis present

## 2022-05-21 DIAGNOSIS — M069 Rheumatoid arthritis, unspecified: Secondary | ICD-10-CM | POA: Diagnosis not present

## 2022-05-21 DIAGNOSIS — L98492 Non-pressure chronic ulcer of skin of other sites with fat layer exposed: Secondary | ICD-10-CM | POA: Diagnosis not present

## 2022-05-21 DIAGNOSIS — I87309 Chronic venous hypertension (idiopathic) without complications of unspecified lower extremity: Secondary | ICD-10-CM | POA: Diagnosis not present

## 2022-05-21 DIAGNOSIS — T8131XA Disruption of external operation (surgical) wound, not elsewhere classified, initial encounter: Secondary | ICD-10-CM | POA: Diagnosis not present

## 2022-05-21 DIAGNOSIS — E11622 Type 2 diabetes mellitus with other skin ulcer: Secondary | ICD-10-CM | POA: Diagnosis not present

## 2022-05-21 DIAGNOSIS — I890 Lymphedema, not elsewhere classified: Secondary | ICD-10-CM | POA: Diagnosis not present

## 2022-05-21 DIAGNOSIS — I5042 Chronic combined systolic (congestive) and diastolic (congestive) heart failure: Secondary | ICD-10-CM | POA: Diagnosis not present

## 2022-05-21 DIAGNOSIS — L02211 Cutaneous abscess of abdominal wall: Secondary | ICD-10-CM | POA: Diagnosis not present

## 2022-05-21 NOTE — Progress Notes (Addendum)
[name] [name], [name] [name] ([id number]) Visit Report for 05/21/2022 Chief Complaint Document Details Patient Name: [name], [name] Date of Service: 05/21/2022 11:00 AM Medical Record Number: [id number] Patient Account Number: [id number] Date of Birth/Sex: 17-Aug-1951 (71 y.o. F) Treating RN: Carlene Coria Primary Care Provider: Lavon Paganini Other Clinician: Referring Provider: Lavon Paganini Treating Provider/Extender: Skipper Cliche in Treatment: 2 Information Obtained from: Patient Chief Complaint Reopened/Abscessed Surgical Wound on the Lower Abdominal Wall Electronic Signature(s) Signed: 05/21/2022 11:27:05 AM By: Worthy Keeler PA-C Entered By: Worthy Keeler on 05/21/2022 11:27:05 [name] [name], [name] ([id number]) -------------------------------------------------------------------------------- HPI Details Patient Name: [name] [name], [name] Date of Service: 05/21/2022 11:00 AM Medical Record Number: [id number] Patient Account Number: [id number] Date of Birth/Sex: [birth year] (71 y.o. F) Treating RN: Carlene Coria Primary Care Provider: Lavon Paganini Other Clinician: Referring Provider: Lavon Paganini Treating Provider/Extender: Skipper Cliche in Treatment: 2 History of Present Illness Location: left lower extremity Severity: improved since her initial hospitalization but stable since Duration: 6 weeks Context: began after her third treatment of Remicade for rheumatoid arthritis Modifying Factors: morbid obesity, chronic venous hypertension, lymphedema, diabetes mellitus type 2, methotrexate use, Remicade use, Associated Signs and Symptoms: pain and swelling left lower extremity HPI Description: she was seen last week where a 2 layer light compression system was applied. She removed that on Sunday because of itching. She states that she did see her primary care physician who has prescribed her a stronger diuretic and she will begin that today. When she removed her dressing she  applied some Neosporin and some gauze. There remains considerable concern about her ability to keep the wrap in place for a week she has been prescribed lymphedema pumps but she does not use those either. We will encourage her to use those twice a day for an hour each time. These were prescribed to her by Dr. Hinton Lovely READMISSION 01/04/17; this is a patient who is a type II diabetic on oral agents. I note she was seen in this clinic 2-3 years ago. I was not involved in her care at that point. She tells [name] that she is had ulcers on her lateral left lower extremity since just before Christmas. There was no obvious precipitant to this. She apparently has been on long-term suppressive penicillin prescribed by Dr. 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 She has noted increasing erythema along with all of this and I think this is what prompted her to come in today. She does not have a known history of PAD or neuropathy ABI in this clinic was 1.06 on the left. The patient has not been requiring any compression on her legs although she does have compression stockings at home as well as external compression pumps. She has been using neither of these. She has not been systemically unwell the patient is also known to the local vein and vascular group. Apparently her external compression pumps were previously prescribed by Dr. Delana Meyer. She has had apparent bilateral ablations done in the past although I have none of these records. 01/15/17; the patient returns for rewrap of her left leg last week and reports that was put on too tight, she had to remove it on Sunday. She states this actually occurred her even though the previous 3 lateral wrap she was able to tolerate well. I don't think there is been much in the way of expansion of the erythema on the  lateral left leg lateral left heel and lateral left  foot. As mentioned previously I think this is chronic venous insufficiency and not really an active cellulitis. She has 2 small open areas in the lower left calf 01/22/17; the patient complains of really generalized pain. She has rheumatoid arthritis as well as fibromyalgia. She had to take the wrap off yesterday. She has small open areas on the lateral left heel and foot. 02/01/2017 -- the patient is seen by me today as she's had compression wraps for 10 days and I understand she did not realize she had to come back to see [name]. She did see Dr. Delana Meyer yesterday and his notes are not in, but the patient tells [name] that he is going to recommend a lymphedema pump and has not recommended any further venous workup or intervention. 02/06/17; the patient went to see Dr. Hinton Lovely of vascular surgery although I don't remember specifically being involved with this discussion. He felt she had chronic venous insufficiency without surgery or intervention necessary at this time. Also noted lymphedema and type 2 diabetes. In the meantime she did not tolerate the wrap on the right leg stating that the small wound anteriorly" burns like fire" 02/13/17; the lesions on the left lateral and left medial leg have closed over. Still looks a little vulnerable on the lateral aspect on the left Right; still an open area here but smaller and appears to be well granulated. Using Silver Collegen. She uses her own wrap on the right leg last week and I think week and continue that on both legs this week 02/26/17; the lesion on the left lateral leg and left medial leg are both closed over. Still to open areas on the right medial leg. She is been using her own stockings and the edema control looks adequate 03/12/17 she has no open areas on the left lateral or left medial leg. She still has an open area on the right medial leg and last week we do find an area over the right medial malleolus. She is been to see vascular surgery who did not  recommend any further venous workup. She has lymphedema pumps which she uses although edema doesn't appear to be a big part of her current nonhealing state. She has been using Prisma Readmission: 05-07-2022 upon evaluation today patient presents for initial inspection here in our clinic concerning a surgical wound over the lower abdominal area. She had a panniculectomy which was performed on 02-05-2022 by Dr. Audelia Hives. Initially the surgery with Dr. Marla Roe was successful seemingly until the patient was seen initially on 03-02-2022 for ED to hospital admission due to cellulitis in this area. She subsequently had surgery which was irrigation and debridement with wound VAC placement that was on 03-04-2022. Unfortunately the wound VAC was never really significantly successful and the patient had a second hospital admission on 03-13-2022. She has had visit to the ER since on 04-05-2022. Eventually she decided to put in a referral herself to come in for further evaluation here at the wound center. She is having a hard time getting this to heal and to be honest wanted some help. With that being said she is now outside of her 90-day postop global period which is great news. All things being said I do think this is good to be a fairly complicated process to heal due to the fact that there are some significant tunnel areas at 3:00 and 9:00 along with the depth straight in. The patient is aware that  this is probably get a take quite a bit of time but nonetheless does want help in doing the best thing possible to get it to heal as quickly as possible and I think that we can definitely help with that. Patient does have a history of diabetes her most recent hemoglobin A1c was 7.4 and that was on 03-03-2022. This was a little higher than her previous 2 readings at 3 and 6 months which were at 6.8. Otherwise the patient does have a history of hypertension as well as congestive heart failure as well. She seems to be  doing okay in regard to those issues. 05-14-2022 upon evaluation today patient appears to be doing well currently in regard to her wound although she has not had the [facility] this was not used by home health they said they could not find her on the formulary. I know we have had patients in the past with [facility] did use Hydrofera Blue however. Nonetheless I think that we already need to go that way she states that it seemed to do much better than the Yorio, Skylarr D. ([id number]) alginate which just does not catch and is much of the drainage at this point. Overall I do not see any signs of active infection locally or systemically at this time. 05-21-2022 upon evaluation today patient appears to be doing well currently in regard to her wound. I am actually very pleased with where things stand I think the Hydrofera Blue blue is doing a pretty good job. With that being said I do think that she is going to need to have the [facility] ready transfer not just the ready at this point. It has a backing on it the wound is being used and has not really can help her significantly at this point. Electronic Signature(s) Signed: 05/21/2022 3:37:21 PM By: Worthy Keeler PA-C Entered By: Worthy Keeler on 05/21/2022 15:37:21 [name] [name], [name] ([id number]) -------------------------------------------------------------------------------- Physical Exam Details Patient Name: [name] [name], [name] Date of Service: 05/21/2022 11:00 AM Medical Record Number: [id number] Patient Account Number: [id number] Date of Birth/Sex: January 02, 1951 (71 y.o. F) Treating RN: Carlene Coria Primary Care Provider: Lavon Paganini Other Clinician: Referring Provider: Lavon Paganini Treating Provider/Extender: Skipper Cliche in Treatment: 2 Constitutional Well-nourished and well-hydrated in no acute distress. Respiratory normal breathing without difficulty. Psychiatric this patient is able to make decisions and demonstrates  good insight into disease process. Alert and Oriented x 3. pleasant and cooperative. Notes Upon inspection patient's wound bed actually showed signs of good granulation and epithelization at this point. Fortunately I do not see any evidence of active infection locally or cystic weekly at this time. Electronic Signature(s) Signed: 05/21/2022 3:37:36 PM By: Worthy Keeler PA-C Entered By: Worthy Keeler on 05/21/2022 15:37:36 [name] [name], [name] ([id number]) -------------------------------------------------------------------------------- Physician Orders Details Patient Name: Gouveia, Tiffeny D. Date of Service: 05/21/2022 11:00 AM Medical Record Number: [id number] Patient Account Number: [id number] Date of Birth/Sex: 1951-07-11 (71 y.o. 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 [place] for wound care. May utilize formulary equivalent dressing for wound treatment orders unless otherwise specified. Home Health Nurse may visit [facility] to address patientos wound care needs. Jackquline [place] [phone] o **Please direct any NON-WOUND related issues/requests for orders to patient's Primary Care Physician. **If current dressing causes regression in wound condition, may D/C ordered dressing product/s and  apply Normal Saline Moist Dressing daily until next [place] or Other MD appointment. **Notify [facility] of regression in wound condition at [phone]. Bathing/ Shower/ Hygiene o May shower; gently cleanse wound with antibacterial soap, rinse and pat dry prior to dressing wounds - on days dressing changed Wound Treatment Wound #9 - Abdomen - Lower Quadrant Wound Laterality: Right Topical: Nystop Nystatin Powder, 15 (g) bottle 3 x Per Week/30 Days Primary Dressing: Hydrofera Blue Ready Transfer Foam, 4x5 (in/in) 3 x Per Week/30 Days Discharge Instructions: Apply Hydrofera Blue Ready to wound bed as directed Secondary Dressing: ABD Pad 5x9 (in/in) 3 x Per Week/30 Days Discharge Instructions: Cover with ABD pad Secured With: Medipore Tape - 72M Medipore H Soft Cloth Surgical Tape, 2x2 (in/yd) 3 x Per Week/30 Days Patient Medications Allergies: sulfa drugs, Lasix, doxycycline, empagliflozin Notifications Medication Indication Start End Cipro 05/21/2022 DOSE 1 - oral 500 mg tablet - 1 tablet oral taken 2 times per day for 14 days. Patient will not take the tolterodine while on this medication Electronic Signature(s) Signed: 05/21/2022 11:40:42 AM By: Worthy Keeler PA-C Entered By: Worthy Keeler on 05/21/2022 11:40:41 [name] [name], [name] ([id number]) [name] [name], [name] ([id number]) -------------------------------------------------------------------------------- Problem List Details Patient Name: Schickling, Jalyah D. Date of Service: 05/21/2022 11:00 AM Medical Record Number: [id number] Patient Account Number: [id number] Date of Birth/Sex: Jul 20, 1951 (71 y.o. F) Treating RN: Carlene Coria Primary Care Provider: Lavon Paganini Other Clinician: Referring Provider: Lavon Paganini Treating Provider/Extender: Skipper Cliche in Treatment: 2 Active Problems ICD-10 Encounter Code Description Active Date MDM Diagnosis T81.31XA Disruption of external operation  (surgical) wound, not elsewhere 05/07/2022 No Yes classified, initial encounter L02.211 Cutaneous abscess of abdominal wall 05/07/2022 No Yes L98.492 Non-pressure chronic ulcer of skin of other sites with fat layer exposed 05/07/2022 No Yes I50.42 Chronic combined systolic (congestive) and diastolic (congestive) heart 05/07/2022 No Yes failure E11.622 Type 2 diabetes mellitus with other skin ulcer 05/07/2022 No Yes I10 Essential (primary) hypertension 05/07/2022 No Yes Inactive Problems Resolved Problems Electronic Signature(s) Signed: 05/21/2022 11:26:58 AM By: Worthy Keeler PA-C Entered By: Worthy Keeler on 05/21/2022 11:26:58 [name] [name], [name] ([id number]) -------------------------------------------------------------------------------- Progress Note Details Patient Name: [name] [name], [name] Date of Service: 05/21/2022 11:00 AM Medical Record Number: [id number] Patient Account Number: [id number] Date of Birth/Sex: 06-May-1951 (71 y.o. F) Treating RN: Carlene Coria Primary Care Provider: Lavon Paganini Other Clinician: Referring Provider: Lavon Paganini Treating Provider/Extender: Skipper Cliche in Treatment: 2 Subjective Chief Complaint Information obtained from Patient Reopened/Abscessed Surgical Wound on the Lower Abdominal Wall History of Present Illness (HPI) The following HPI elements were documented for the patient's wound: Location: left lower extremity Severity: improved since her initial hospitalization but stable since Duration: 6 weeks Context: began after her third treatment of Remicade for rheumatoid arthritis Modifying Factors: morbid obesity, chronic venous hypertension, lymphedema, diabetes mellitus type 2, methotrexate use, Remicade use, Associated Signs and Symptoms: pain and swelling left lower extremity she was seen last week where a 2 layer light compression system was applied. She removed that on Sunday because of itching. She states that  she did see her primary  care physician who has prescribed her a stronger diuretic and she will begin that today. When she removed her dressing she applied some Neosporin and some gauze. There remains considerable concern about her ability to keep the wrap in place for a week she has been prescribed lymphedema pumps but she does not use those either. We will encourage her to use those twice a day for an hour each time. These were prescribed to her by Dr. Hinton Lovely READMISSION 01/04/17; this is a patient who is a type II diabetic on oral agents. I note she was seen in this clinic 2-3 years ago. I was not involved in her care at that point. She tells [name] that she is had ulcers on her lateral left lower extremity since just before Christmas. There was no obvious precipitant to this. She apparently has been on long-term suppressive penicillin prescribed by Dr. 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 She has noted increasing erythema along with all of this and I think this is what prompted her to come in today. She does not have a known history of PAD or neuropathy ABI in this clinic was 1.06 on the left. The patient has not been requiring any compression on her legs although she does have compression stockings at home as well as external compression pumps. She has been using neither of these. She has not been systemically unwell the patient is also known to the local vein and vascular group. Apparently her external compression pumps were previously prescribed by Dr. Delana Meyer. She has had apparent bilateral ablations done in the past although I have none of these records. 01/15/17; the patient returns for rewrap of her left leg last week and reports that was put on too tight, she had to remove it on Sunday. She states this actually occurred her even though the previous 3 lateral wrap she was able to tolerate well. I don't think  there is been much in the way of expansion of the erythema on the lateral left leg lateral left heel and lateral left foot. As mentioned previously I think this is chronic venous insufficiency and not really an active cellulitis. She has 2 small open areas in the lower left calf 01/22/17; the patient complains of really generalized pain. She has rheumatoid arthritis as well as fibromyalgia. She had to take the wrap off yesterday. She has small open areas on the lateral left heel and foot. 02/01/2017 -- the patient is seen by me today as she's had compression wraps for 10 days and I understand she did not realize she had to come back to see [name]. She did see Dr. Delana Meyer yesterday and his notes are not in, but the patient tells [name] that he is going to recommend a lymphedema pump and has not recommended any further venous workup or intervention. 02/06/17; the patient went to see Dr. Hinton Lovely of vascular surgery although I don't remember specifically being involved with this discussion. He felt she had chronic venous insufficiency without surgery or intervention necessary at this time. Also noted lymphedema and type 2 diabetes. In the meantime she did not tolerate the wrap on the right leg stating that the small wound anteriorly" burns like fire" 02/13/17; the lesions on the left lateral and left medial leg have closed over. Still looks a little vulnerable on the lateral aspect on the left Right;  still an open area here but smaller and appears to be well granulated. Using Silver Collegen. She uses her own wrap on the right leg last week and I think week and continue that on both legs this week 02/26/17; the lesion on the left lateral leg and left medial leg are both closed over. Still to open areas on the right medial leg. She is been using her own stockings and the edema control looks adequate 03/12/17 she has no open areas on the left lateral or left medial leg. She still has an open area on the right medial  leg and last week we do find an area over the right medial malleolus. She is been to see vascular surgery who did not recommend any further venous workup. She has lymphedema pumps which she uses although edema doesn't appear to be a big part of her current nonhealing state. She has been using Prisma Readmission: 05-07-2022 upon evaluation today patient presents for initial inspection here in our clinic concerning a surgical wound over the lower abdominal area. She had a panniculectomy which was performed on 02-05-2022 by Dr. Audelia Hives. Initially the surgery with Dr. Marla Roe was successful seemingly until the patient was seen initially on 03-02-2022 for ED to hospital admission due to cellulitis in this area. She subsequently had surgery which was irrigation and debridement with wound VAC placement that was on 03-04-2022. Unfortunately the wound VAC was never really significantly successful and the patient had a second hospital admission on 03-13-2022. She has had visit to the ER since on 04-05-2022. Eventually she decided to put in a referral herself to come in for further evaluation here at the wound center. She is having a hard time getting this to heal and to be honest wanted some help. With that being said she is now outside of her 90-day postop global period which is great news. All things being said I do think this is good to be a fairly complicated process to heal due to the fact that there are some significant tunnel areas at 3:00 and 9:00 along with the depth straight in. The patient is aware that this is probably get a take quite a bit of time but nonetheless does want help in doing the best thing possible to get it to heal as quickly as possible and I think that we can definitely help with that. [name] [name], [name] [name] D. ([id number]) Patient does have a history of diabetes her most recent hemoglobin A1c was 7.4 and that was on 03-03-2022. This was a little higher than her previous 2 readings at 3 and  6 months which were at 6.8. Otherwise the patient does have a history of hypertension as well as congestive heart failure as well. She seems to be doing okay in regard to those issues. 05-14-2022 upon evaluation today patient appears to be doing well currently in regard to her wound although she has not had the [facility] this was not used by home health they said they could not find her on the formulary. I know we have had patients in the past with [facility] did use Hydrofera Blue however. Nonetheless I think that we already need to go that way she states that it seemed to do much better than the alginate which just does not catch and is much of the drainage at this point. Overall I do not see any signs of active infection locally or systemically at this time. 05-21-2022 upon evaluation today patient appears to be doing well currently in  regard to her wound. I am actually very pleased with where things stand I think the Hydrofera Blue blue is doing a pretty good job. With that being said I do think that she is going to need to have the [facility] ready transfer not just the ready at this point. It has a backing on it the wound is being used and has not really can help her significantly at this point. Objective Constitutional Well-nourished and well-hydrated in no acute distress. Vitals Time Taken: 11:19 AM, Height: 67 in, Weight: 230 lbs, BMI: 36, Temperature: 98.3 F, Pulse: 73 bpm, Respiratory Rate: 18 breaths/min, Blood Pressure: 126/75 mmHg. Respiratory normal breathing without difficulty. Psychiatric this patient is able to make decisions and demonstrates good insight into disease process. Alert and Oriented x 3. pleasant and cooperative. General Notes: Upon inspection patient's wound bed actually showed signs of good granulation and epithelization at this point. Fortunately I do not see any evidence of active infection locally or cystic weekly at this time. Integumentary (Hair,  Skin) Wound #9 status is Open. Original cause of wound was Gradually Appeared. The date acquired was: 01/24/2022. The wound has been in treatment 2 weeks. The wound is located on the Right Abdomen - Lower Quadrant. The wound measures 0.5cm length x 3.5cm width x 3.1cm depth; 1.374cm^2 area and 4.261cm^3 volume. There is Fat Layer (Subcutaneous Tissue) exposed. There is no tunneling or undermining noted. There is a large amount of serosanguineous drainage noted. There is large (67-100%) red granulation within the wound bed. There is no necrotic tissue within the wound bed. 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 Home Health: [facility] for wound care. May utilize formulary equivalent dressing for wound treatment orders unless otherwise specified. Home Breit, PORFIRIA HEINRICH ([id number]) Health Nurse may visit PRN to address patient s wound care needs. Jackquline Denmark [phone] **Please direct any NON-WOUND related issues/requests for orders to patient's Primary Care Physician. **If current dressing causes regression in wound condition, may D/C ordered dressing product/s and apply Normal Saline Moist Dressing daily until next [place] or Other MD appointment. **Notify [facility] of regression in wound condition at [phone]. Bathing/ Shower/ Hygiene: May shower; gently cleanse wound with antibacterial soap, rinse and pat dry prior to dressing wounds - on days dressing changed The following medication(s) was prescribed: Cipro oral 500 mg tablet 1 1 tablet oral  taken 2 times per day for 14 days. Patient will not take the tolterodine while on this medication starting 05/21/2022 WOUND #9: - Abdomen - Lower Quadrant Wound Laterality: Right Topical: Nystop Nystatin Powder, 15 (g) bottle 3 x Per Week/30 Days Primary Dressing: Hydrofera Blue Ready Transfer Foam, 4x5 (in/in) 3 x Per Week/30 Days Discharge Instructions: Apply Hydrofera Blue Ready to wound bed as directed Secondary Dressing: ABD Pad 5x9 (in/in) 3 x Per Week/30 Days Discharge Instructions: Cover with ABD pad Secured With: Medipore Tape - 77M Medipore H Soft Cloth Surgical Tape, 2x2 (in/yd) 3 x Per Week/30 Days 1. I would recommend that we going continue with the wound care measures as before and the patient is in agreement with plan this includes the use of the [facility] although we need  the ready transfer not just the ready and this needs to be ordered appropriately through home health. 2. I am also can recommend that we have the patient should continue with the ABD pad to cover. 3. We will also continue with the tape to secure in place. We will see patient back for reevaluation in 1 week here in the clinic. If anything worsens or changes patient will contact our office for additional recommendations. Electronic Signature(s) Signed: 05/21/2022 3:38:05 PM By: Worthy Keeler PA-C Entered By: Worthy Keeler on 05/21/2022 15:38:05 Bame, Gabbi D. ([id number]) -------------------------------------------------------------------------------- SuperBill Details Patient Name: Gamel, Vaudine D. Date of Service: 05/21/2022 Medical Record Number: [id number] Patient Account Number: [id number] Date of Birth/Sex: [birth year] (71 y.o. F) Treating RN: Carlene Coria Primary Care Provider: Lavon Paganini Other Clinician: Referring Provider: Lavon Paganini Treating Provider/Extender: Skipper Cliche in Treatment: 2 Diagnosis Coding ICD-10 Codes Code Description T81.31XA Disruption of external  operation (surgical) wound, not elsewhere classified, initial encounter L02.211 Cutaneous abscess of abdominal wall L98.492 Non-pressure chronic ulcer of skin of other sites with fat layer exposed I50.42 Chronic combined systolic (congestive) and diastolic (congestive) heart failure E11.622 Type 2 diabetes mellitus with other skin ulcer I10 Essential (primary) hypertension Facility Procedures CPT4 Code: 41583094 Description: [phone] - WOUND CARE VISIT-LEV 2 EST PT Modifier: Quantity: 1 Physician Procedures CPT4 Code: 8811031 Description: 99213 - WC PHYS LEVEL 3 - EST PT Modifier: Quantity: 1 CPT4 Code: Description: ICD-10 Diagnosis Description T81.31XA Disruption of external operation (surgical) wound, not elsewhere classifi L02.211 Cutaneous abscess of abdominal wall L98.492 Non-pressure chronic ulcer of skin of other sites with fat layer exposed  I50.42 Chronic combined systolic (congestive) and diastolic (congestive) heart f Modifier: ed, initial encounter ailure Quantity: Electronic Signature(s) Signed: 05/21/2022 3:38:27 PM By: Worthy Keeler PA-C Previous Signature: 05/21/2022 11:59:17 AM Version By: Carlene Coria RN Entered By: Worthy Keeler on 05/21/2022 15:38:26

## 2022-05-22 DIAGNOSIS — Z96652 Presence of left artificial knee joint: Secondary | ICD-10-CM | POA: Diagnosis not present

## 2022-05-22 DIAGNOSIS — I110 Hypertensive heart disease with heart failure: Secondary | ICD-10-CM | POA: Diagnosis not present

## 2022-05-22 DIAGNOSIS — Z87891 Personal history of nicotine dependence: Secondary | ICD-10-CM | POA: Diagnosis not present

## 2022-05-22 DIAGNOSIS — T8149XD Infection following a procedure, other surgical site, subsequent encounter: Secondary | ICD-10-CM | POA: Diagnosis not present

## 2022-05-22 DIAGNOSIS — F32A Depression, unspecified: Secondary | ICD-10-CM | POA: Diagnosis not present

## 2022-05-22 DIAGNOSIS — M1990 Unspecified osteoarthritis, unspecified site: Secondary | ICD-10-CM | POA: Diagnosis not present

## 2022-05-22 DIAGNOSIS — M069 Rheumatoid arthritis, unspecified: Secondary | ICD-10-CM | POA: Diagnosis not present

## 2022-05-22 DIAGNOSIS — Z7984 Long term (current) use of oral hypoglycemic drugs: Secondary | ICD-10-CM | POA: Diagnosis not present

## 2022-05-22 DIAGNOSIS — E785 Hyperlipidemia, unspecified: Secondary | ICD-10-CM | POA: Diagnosis not present

## 2022-05-22 DIAGNOSIS — I509 Heart failure, unspecified: Secondary | ICD-10-CM | POA: Diagnosis not present

## 2022-05-22 DIAGNOSIS — M545 Low back pain, unspecified: Secondary | ICD-10-CM | POA: Diagnosis not present

## 2022-05-22 DIAGNOSIS — E119 Type 2 diabetes mellitus without complications: Secondary | ICD-10-CM | POA: Diagnosis not present

## 2022-05-22 DIAGNOSIS — M48061 Spinal stenosis, lumbar region without neurogenic claudication: Secondary | ICD-10-CM | POA: Diagnosis not present

## 2022-05-22 DIAGNOSIS — G8929 Other chronic pain: Secondary | ICD-10-CM | POA: Diagnosis not present

## 2022-05-22 DIAGNOSIS — M797 Fibromyalgia: Secondary | ICD-10-CM | POA: Diagnosis not present

## 2022-05-22 DIAGNOSIS — I2510 Atherosclerotic heart disease of native coronary artery without angina pectoris: Secondary | ICD-10-CM | POA: Diagnosis not present

## 2022-05-22 DIAGNOSIS — Z9181 History of falling: Secondary | ICD-10-CM | POA: Diagnosis not present

## 2022-05-23 DIAGNOSIS — E119 Type 2 diabetes mellitus without complications: Secondary | ICD-10-CM | POA: Diagnosis not present

## 2022-05-23 DIAGNOSIS — T8149XD Infection following a procedure, other surgical site, subsequent encounter: Secondary | ICD-10-CM | POA: Diagnosis not present

## 2022-05-23 DIAGNOSIS — G8929 Other chronic pain: Secondary | ICD-10-CM | POA: Diagnosis not present

## 2022-05-23 DIAGNOSIS — F32A Depression, unspecified: Secondary | ICD-10-CM | POA: Diagnosis not present

## 2022-05-23 DIAGNOSIS — Z7984 Long term (current) use of oral hypoglycemic drugs: Secondary | ICD-10-CM | POA: Diagnosis not present

## 2022-05-23 DIAGNOSIS — M069 Rheumatoid arthritis, unspecified: Secondary | ICD-10-CM | POA: Diagnosis not present

## 2022-05-23 DIAGNOSIS — I509 Heart failure, unspecified: Secondary | ICD-10-CM | POA: Diagnosis not present

## 2022-05-23 DIAGNOSIS — I110 Hypertensive heart disease with heart failure: Secondary | ICD-10-CM | POA: Diagnosis not present

## 2022-05-23 DIAGNOSIS — I2510 Atherosclerotic heart disease of native coronary artery without angina pectoris: Secondary | ICD-10-CM | POA: Diagnosis not present

## 2022-05-23 DIAGNOSIS — Z9181 History of falling: Secondary | ICD-10-CM | POA: Diagnosis not present

## 2022-05-23 DIAGNOSIS — Z96652 Presence of left artificial knee joint: Secondary | ICD-10-CM | POA: Diagnosis not present

## 2022-05-23 DIAGNOSIS — M797 Fibromyalgia: Secondary | ICD-10-CM | POA: Diagnosis not present

## 2022-05-23 DIAGNOSIS — M1990 Unspecified osteoarthritis, unspecified site: Secondary | ICD-10-CM | POA: Diagnosis not present

## 2022-05-23 DIAGNOSIS — M545 Low back pain, unspecified: Secondary | ICD-10-CM | POA: Diagnosis not present

## 2022-05-23 DIAGNOSIS — M48061 Spinal stenosis, lumbar region without neurogenic claudication: Secondary | ICD-10-CM | POA: Diagnosis not present

## 2022-05-23 DIAGNOSIS — Z87891 Personal history of nicotine dependence: Secondary | ICD-10-CM | POA: Diagnosis not present

## 2022-05-23 DIAGNOSIS — E785 Hyperlipidemia, unspecified: Secondary | ICD-10-CM | POA: Diagnosis not present

## 2022-05-23 NOTE — Progress Notes (Signed)
[name] [name] ([id number]) Visit Report for 05/21/2022 Arrival Information Details Patient Name: Glazebrook, [name] Date of Service: 05/21/2022 11:00 AM Medical Record Number: [id number] Patient Account Number: [id number] Date of Birth/Sex: [birth year] (71 y.o. F) Treating RN: Carlene Coria Primary Care Thad Osoria: Lavon Paganini Other Clinician: Referring Laetitia Schnepf: Lavon Paganini Treating Justeen Hehr/Extender: Skipper Cliche in Treatment: 2 Visit Information History Since Last Visit All ordered tests and consults were completed: No Patient Arrived: Ambulatory Added or deleted any medications: No Arrival Time: 11:15 Any new allergies or adverse reactions: No Accompanied By: self Had a fall or experienced change in No Transfer Assistance: None activities of daily living that may affect Patient Identification Verified: Yes risk of falls: Secondary Verification Process Completed: Yes Signs or symptoms of abuse/neglect since last visito No Patient Requires Transmission-Based Precautions: No Hospitalized since last visit: No Patient Has Alerts: Yes Implantable device outside of the clinic excluding No Patient Alerts: DIABETIC cellular tissue based products placed in the center since last visit: Has Dressing in Place as Prescribed: Yes Pain Present Now: No Electronic Signature(s) Signed: 05/23/2022 2:40:31 PM By: Carlene Coria RN Entered By: Carlene Coria on 05/21/2022 11:19:00 Spittler, Arleene D. ([id number]) -------------------------------------------------------------------------------- Clinic Level of Care Assessment Details Patient Name: Frasco, Octavie D. Date of Service: 05/21/2022 11:00 AM Medical Record Number: [id number] Patient Account Number: [id number] Date of Birth/Sex: November 18, 1951 (71 y.o. F) Treating RN: Carlene Coria Primary Care Adrain Nesbit: Lavon Paganini Other Clinician: Referring Melvern Ramone: Lavon Paganini Treating Shadonna Benedick/Extender: Skipper Cliche in Treatment:  2 Clinic Level of Care Assessment Items TOOL 4 Quantity Score X - Use when only an EandM is performed on FOLLOW-UP visit 1 0 ASSESSMENTS - Nursing Assessment / Reassessment X - Reassessment of Co-morbidities (includes updates in patient status) 1 10 X- 1 5 Reassessment of Adherence to Treatment Plan ASSESSMENTS - Wound and Skin Assessment / Reassessment X - Simple Wound Assessment / Reassessment - one wound 1 5 '[]'$  - 0 Complex Wound Assessment / Reassessment - multiple wounds '[]'$  - 0 Dermatologic / Skin Assessment (not related to wound area) ASSESSMENTS - Focused Assessment '[]'$  - Circumferential Edema Measurements - multi extremities 0 '[]'$  - 0 Nutritional Assessment / Counseling / Intervention '[]'$  - 0 Lower Extremity Assessment (monofilament, tuning fork, pulses) '[]'$  - 0 Peripheral Arterial Disease Assessment (using hand held doppler) ASSESSMENTS - Ostomy and/or Continence Assessment and Care '[]'$  - Incontinence Assessment and Management 0 '[]'$  - 0 Ostomy Care Assessment and Management (repouching, etc.) PROCESS - Coordination of Care X - Simple Patient / Family Education for ongoing care 1 15 '[]'$  - 0 Complex (extensive) Patient / Family Education for ongoing care '[]'$  - 0 Staff obtains Programmer, systems, Records, Test Results / Process Orders '[]'$  - 0 Staff telephones HHA, Nursing Homes / Clarify orders / etc '[]'$  - 0 Routine Transfer to another Facility (non-emergent condition) '[]'$  - 0 Routine Hospital Admission (non-emergent condition) '[]'$  - 0 New Admissions / Biomedical engineer / Ordering NPWT, Apligraf, etc. '[]'$  - 0 Emergency Hospital Admission (emergent condition) X- 1 10 Simple Discharge Coordination '[]'$  - 0 Complex (extensive) Discharge Coordination PROCESS - Special Needs '[]'$  - Pediatric / Minor Patient Management 0 '[]'$  - 0 Isolation Patient Management '[]'$  - 0 Hearing / Language / Visual special needs '[]'$  - 0 Assessment of Community assistance (transportation, D/C planning,  etc.) '[]'$  - 0 Additional assistance / Altered mentation '[]'$  - 0 Support Surface(s) Assessment (bed, cushion, seat, etc.) INTERVENTIONS - Wound Cleansing / Measurement Bonifield, Keyah D. ([id number]) X- 1 5 Simple  Wound Cleansing - one wound '[]'$  - 0 Complex Wound Cleansing - multiple wounds X- 1 5 Wound Imaging (photographs - any number of wounds) '[]'$  - 0 Wound Tracing (instead of photographs) X- 1 5 Simple Wound Measurement - one wound '[]'$  - 0 Complex Wound Measurement - multiple wounds INTERVENTIONS - Wound Dressings X - Small Wound Dressing one or multiple wounds 1 10 '[]'$  - 0 Medium Wound Dressing one or multiple wounds '[]'$  - 0 Large Wound Dressing one or multiple wounds '[]'$  - 0 Application of Medications - topical '[]'$  - 0 Application of Medications - injection INTERVENTIONS - Miscellaneous '[]'$  - External ear exam 0 '[]'$  - 0 Specimen Collection (cultures, biopsies, blood, body fluids, etc.) '[]'$  - 0 Specimen(s) / Culture(s) sent or taken to Lab for analysis '[]'$  - 0 Patient Transfer (multiple staff / Civil Service fast streamer / Similar devices) '[]'$  - 0 Simple Staple / Suture removal (25 or less) '[]'$  - 0 Complex Staple / Suture removal (26 or more) '[]'$  - 0 Hypo / Hyperglycemic Management (close monitor of Blood Glucose) '[]'$  - 0 Ankle / Brachial Index (ABI) - do not check if billed separately X- 1 5 Vital Signs Has the patient been seen at the hospital within the last three years: Yes Total Score: 75 Level Of Care: New/Established - Level 2 Electronic Signature(s) Signed: 05/23/2022 2:40:31 PM By: Carlene Coria RN Entered By: Carlene Coria on 05/21/2022 11:31:35 Ueda, Kala D. ([id number]) -------------------------------------------------------------------------------- Encounter Discharge Information Details Patient Name: Chartrand, Wilhelmine D. Date of Service: 05/21/2022 11:00 AM Medical Record Number: [id number] Patient Account Number: [id number] Date of Birth/Sex: 1951-03-07 (71 y.o. F) Treating RN:  Carlene Coria Primary Care Allora Bains: Lavon Paganini Other Clinician: Referring Naquan Garman: Lavon Paganini Treating Lamesha Tibbits/Extender: Skipper Cliche in Treatment: 2 Encounter Discharge Information Items Discharge Condition: Stable Ambulatory Status: Ambulatory Discharge Destination: Home Transportation: Private Auto Accompanied By: self Schedule Follow-up Appointment: Yes Clinical Summary of Care: Electronic Signature(s) Signed: 05/21/2022 12:00:06 PM By: Carlene Coria RN Entered By: Carlene Coria on 05/21/2022 12:00:06 Cabezas, Gazella D. ([id number]) -------------------------------------------------------------------------------- Lower Extremity Assessment Details Patient Name: Muff, Ciarah D. Date of Service: 05/21/2022 11:00 AM Medical Record Number: [id number] Patient Account Number: [id number] Date of Birth/Sex: 01/28/1951 (72 y.o. F) Treating RN: Carlene Coria Primary Care Gilberto Stanforth: Lavon Paganini Other Clinician: Referring Zamier Eggebrecht: Lavon Paganini Treating Kassiah Mccrory/Extender: Skipper Cliche in Treatment: 2 Electronic Signature(s) Signed: 05/23/2022 2:40:31 PM By: Carlene Coria RN Entered By: Carlene Coria on 05/21/2022 11:29:08 Lawry, Kirsty D. ([id number]) -------------------------------------------------------------------------------- Multi Wound Chart Details Patient Name: Minor, Courtlyn D. Date of Service: 05/21/2022 11:00 AM Medical Record Number: [id number] Patient Account Number: [id number] Date of Birth/Sex: 07/28/1951 (71 y.o. F) Treating RN: Carlene Coria Primary Care Offie Waide: Lavon Paganini Other Clinician: Referring Esaiah Wanless: Lavon Paganini Treating Neils Siracusa/Extender: Skipper Cliche in Treatment: 2 Vital Signs Height(in): 67 Pulse(bpm): 39 Weight(lbs): 230 Blood Pressure(mmHg): 126/75 Body Mass Index(BMI): 36 Temperature(F): 98.3 Respiratory Rate(breaths/min): 18 Photos: [N/A:N/A] Wound Location: Right Abdomen - Lower Quadrant N/A  N/A Wounding Event: Gradually Appeared N/A N/A Primary Etiology: Abscess N/A N/A Comorbid History: Lymphedema, Congestive Heart N/A N/A Failure, Coronary Artery Disease, Hypertension, Type II Diabetes, Rheumatoid Arthritis, Osteoarthritis, Confinement Anxiety Date Acquired: 01/24/2022 N/A N/A Weeks of Treatment: 2 N/A N/A Wound Status: Open N/A N/A Wound Recurrence: No N/A N/A Measurements L x W x D (cm) 0.5x3.5x3.1 N/A N/A Area (cm) : 1.374 N/A N/A Volume (cm) : 4.261 N/A N/A % Reduction in Area: -16.60% N/A N/A % Reduction in Volume: -64.40% N/A N/A Classification:  Full Thickness Without Exposed N/A N/A Support Structures Exudate Amount: Large N/A N/A Exudate Type: Serosanguineous N/A N/A Exudate Color: red, brown N/A N/A Granulation Amount: Large (67-100%) N/A N/A Granulation Quality: Red N/A N/A Necrotic Amount: None Present (0%) N/A N/A Exposed Structures: Fat Layer (Subcutaneous Tissue): N/A N/A Yes Fascia: No Tendon: No Muscle: No Joint: No Bone: No Epithelialization: None N/A N/A Treatment Notes Electronic Signature(s) Signed: 05/23/2022 2:40:31 PM By: Carlene Coria RN Entered By: Carlene Coria on 05/21/2022 11:29:45 Goodlin, Gurpreet D. ([id number]) Zagal, Mariabelen D. ([id number]) -------------------------------------------------------------------------------- [place] Details Patient Name: Klose, Kacy D. Date of Service: 05/21/2022 11:00 AM Medical Record Number: [id number] Patient Account Number: [id number] Date of Birth/Sex: 1950-12-28 (71 y.o. F) Treating RN: Carlene Coria Primary Care Jesiah Grismer: Lavon Paganini Other Clinician: Referring Almalik Weissberg: Lavon Paganini Treating Tevin Shillingford/Extender: Skipper Cliche in Treatment: 2 Active Inactive Nutrition Nursing Diagnoses: Potential for alteratiion in Nutrition/Potential for imbalanced nutrition Goals: Patient/caregiver verbalizes understanding of need to maintain therapeutic glucose control  per primary care physician Date Initiated: 05/07/2022 Target Resolution Date: 06/06/2022 Goal Status: Active Interventions: Assess HgA1c results as ordered upon admission and as needed Assess patient nutrition upon admission and as needed per policy Notes: Wound/Skin Impairment Nursing Diagnoses: Knowledge deficit related to ulceration/compromised skin integrity Goals: Patient/caregiver will verbalize understanding of skin care regimen Date Initiated: 05/07/2022 Target Resolution Date: 06/06/2022 Goal Status: Active Ulcer/skin breakdown will have a volume reduction of 30% by week 4 Date Initiated: 05/07/2022 Target Resolution Date: 06/06/2022 Goal Status: Active Ulcer/skin breakdown will have a volume reduction of 50% by week 8 Date Initiated: 05/07/2022 Target Resolution Date: 07/07/2022 Goal Status: Active Ulcer/skin breakdown will have a volume reduction of 80% by week 12 Date Initiated: 05/07/2022 Target Resolution Date: 08/07/2022 Goal Status: Active Ulcer/skin breakdown will heal within 14 weeks Date Initiated: 05/07/2022 Target Resolution Date: 09/06/2022 Goal Status: Active Interventions: Assess patient/caregiver ability to obtain necessary supplies Assess patient/caregiver ability to perform ulcer/skin care regimen upon admission and as needed Assess ulceration(s) every visit Notes: Electronic Signature(s) Signed: 05/23/2022 2:40:31 PM By: Carlene Coria RN Entered By: Carlene Coria on 05/21/2022 11:29:22 Riedlinger, Inaaya D. ([id number]) -------------------------------------------------------------------------------- Pain Assessment Details Patient Name: Ayala, Joniyah D. Date of Service: 05/21/2022 11:00 AM Medical Record Number: [id number] Patient Account Number: [id number] Date of Birth/Sex: Jan 13, 1951 (71 y.o. F) Treating RN: Carlene Coria Primary Care Harlei Lehrmann: Lavon Paganini Other Clinician: Referring Temitope Flammer: Lavon Paganini Treating Vernia Teem/Extender: Skipper Cliche in  Treatment: 2 Active Problems Location of Pain Severity and Description of Pain Patient Has Paino No Site Locations Pain Management and Medication Current Pain Management: Electronic Signature(s) Signed: 05/23/2022 2:40:31 PM By: Carlene Coria RN Entered By: Carlene Coria on 05/21/2022 11:19:25 Bettcher, Ashten D. ([id number]) -------------------------------------------------------------------------------- Patient/Caregiver Education Details Patient Name: Mcnair, Kimberlyn D. Date of Service: 05/21/2022 11:00 AM Medical Record Number: [id number] Patient Account Number: [id number] Date of Birth/Gender: [birth year] (71 y.o. F) Treating RN: Carlene Coria Primary Care Physician: Lavon Paganini Other Clinician: Referring Physician: Lavon Paganini Treating Physician/Extender: Skipper Cliche in Treatment: 2 Education Assessment Education Provided To: Patient Education Topics Provided Wound/Skin Impairment: Methods: Explain/Verbal Responses: State content correctly Electronic Signature(s) Signed: 05/23/2022 2:40:31 PM By: Carlene Coria RN Entered By: Carlene Coria on 05/21/2022 11:59:29 Shuffler, Rhaelyn D. ([id number]) -------------------------------------------------------------------------------- Wound Assessment Details Patient Name: Craigo, Elvy D. Date of Service: 05/21/2022 11:00 AM Medical Record Number: [id number] Patient Account Number: [id number] Date of Birth/Sex: [birth year] (71 y.o. F) Treating RN: Carlene Coria Primary Care Vanesa Renier: Lavon Paganini Other Clinician: Referring Kidus Delman: Brita Romp  Levada Dy Treating Gennell How/Extender: Jeri Cos Weeks in Treatment: 2 Wound Status Wound Number: 9 Primary Abscess Etiology: Wound Location: Right Abdomen - Lower Quadrant Wound Open Wounding Event: Gradually Appeared Status: Date Acquired: 01/24/2022 Comorbid Lymphedema, Congestive Heart Failure, Coronary Artery Weeks Of Treatment: 2 History: Disease, Hypertension, Type II  Diabetes, Rheumatoid Clustered Wound: No Arthritis, Osteoarthritis, Confinement Anxiety Photos Wound Measurements Length: (cm) 0.5 Width: (cm) 3.5 Depth: (cm) 3.1 Area: (cm) 1.374 Volume: (cm) 4.261 % Reduction in Area: -16.6% % Reduction in Volume: -64.4% Epithelialization: None Tunneling: No Undermining: No Wound Description Classification: Full Thickness Without Exposed Support Structu Exudate Amount: Large Exudate Type: Serosanguineous Exudate Color: red, brown res Foul Odor After Cleansing: No Slough/Fibrino No Wound Bed Granulation Amount: Large (67-100%) Exposed Structure Granulation Quality: Red Fascia Exposed: No Necrotic Amount: None Present (0%) Fat Layer (Subcutaneous Tissue) Exposed: Yes Tendon Exposed: No Muscle Exposed: No Joint Exposed: No Bone Exposed: No Treatment Notes Wound #9 (Abdomen - Lower Quadrant) Wound Laterality: Right Cleanser Peri-Wound Care Topical Nystop Nystatin Powder, 15 (g) bottle Eroh, Sadiya D. ([id number]) Primary Dressing Hydrofera Blue Ready Transfer Foam, 4x5 (in/in) Discharge Instruction: Apply Hydrofera Blue Ready to wound bed as directed Secondary Dressing ABD Pad 5x9 (in/in) Discharge Instruction: Cover with ABD pad Secured With Medipore Tape - 63M Medipore H Soft Cloth Surgical Tape, 2x2 (in/yd) Compression Wrap Compression Stockings Add-Ons Electronic Signature(s) Signed: 05/23/2022 2:40:31 PM By: Carlene Coria RN Entered By: Carlene Coria on 05/21/2022 11:24:59 Ewell, Gwynne D. ([id number]) -------------------------------------------------------------------------------- Vitals Details Patient Name: Taffe, Jaryn D. Date of Service: 05/21/2022 11:00 AM Medical Record Number: [id number] Patient Account Number: [id number] Date of Birth/Sex: 19-Aug-1951 (71 y.o. F) Treating RN: Carlene Coria Primary Care Tarun Patchell: Lavon Paganini Other Clinician: Referring Shanetra Blumenstock: Lavon Paganini Treating Jalik Gellatly/Extender:  Skipper Cliche in Treatment: 2 Vital Signs Time Taken: 11:19 Temperature (F): 98.3 Height (in): 67 Pulse (bpm): 73 Weight (lbs): 230 Respiratory Rate (breaths/min): 18 Body Mass Index (BMI): 36 Blood Pressure (mmHg): 126/75 Reference Range: 80 - 120 mg / dl Electronic Signature(s) Signed: 05/23/2022 2:40:31 PM By: Carlene Coria RN Entered By: Carlene Coria on 05/21/2022 11:19:19

## 2022-05-23 NOTE — Progress Notes (Deleted)
      Established patient visit   Patient: [name] [name]   DOB: [birth year]   71 y.o. Female  MRN: [id number] Visit Date: 05/24/2022  Today's healthcare provider: Lavon Paganini, MD   No chief complaint on file.  Subjective    HPI  ***  Medications: Outpatient Medications Prior to Visit  Medication Sig   acetaminophen (TYLENOL) 500 MG tablet Take 1,000 mg by mouth every 6 (six) hours as needed for mild pain.   Biotin w/ Vitamins C & E (HAIR/SKIN/NAILS PO) Take 1 tablet by mouth daily.   buPROPion (WELLBUTRIN XL) 300 MG 24 hr tablet NEW PRESCRIPTION REQUEST: TAKE ONE TABLET BY MOUTH DAILY   carvedilol (COREG) 6.25 MG tablet NEW PRESCRIPTION REQUEST: TAKE ONE TABLET BY MOUTH TWICE DAILY   DULoxetine (CYMBALTA) 60 MG capsule NEW PRESCRIPTION REQUEST: TAKE ONE CAPSULE BY MOUTH DAILY   ezetimibe (ZETIA) 10 MG tablet TAKE 1 TABLET BY MOUTH EVERY DAY (Patient taking differently: Take 10 mg by mouth daily.)   GEMTESA 75 MG TABS Take 1 tablet by mouth daily.   losartan (COZAAR) 25 MG tablet Take 1 tablet (25 mg total) by mouth daily.   meloxicam (MOBIC) 15 MG tablet TAKE 1 TABLET (15 MG TOTAL) BY MOUTH DAILY.   metFORMIN (GLUCOPHAGE) 500 MG tablet NEW PRESCRIPTION REQUEST: TAKE ONE TABLET BY MOUTH TWICE DAILY   polyethylene glycol (MIRALAX / GLYCOLAX) 17 g packet Take 17 g by mouth daily as needed for mild constipation.   pregabalin (LYRICA) 75 MG capsule Take 1 capsule (75 mg total) by mouth 3 (three) times daily.   tolterodine (DETROL LA) 4 MG 24 hr capsule Take 4 mg by mouth daily.   No facility-administered medications prior to visit.    Review of Systems  {Labs  Heme  Chem  Endocrine  Serology  Results Review (optional):23779}   Objective    LMP  (LMP Unknown) Comment: age 60 {Show previous vital signs (optional):23777}  Physical Exam  ***  No results found for any visits on 05/24/22.  Assessment & Plan     ***  No follow-ups on file.      {provider  attestation***:1}   Lavon Paganini, MD  [facility] [phone] (phone) [phone] (fax)  [place]

## 2022-05-24 ENCOUNTER — Ambulatory Visit: Payer: Medicare Other | Admitting: Family Medicine

## 2022-05-24 LAB — AEROBIC CULTURE W GRAM STAIN (SUPERFICIAL SPECIMEN): Gram Stain: NONE SEEN

## 2022-05-27 ENCOUNTER — Other Ambulatory Visit: Payer: Self-pay | Admitting: Family Medicine

## 2022-05-28 ENCOUNTER — Encounter: Payer: Medicare Other | Admitting: Physician Assistant

## 2022-05-28 DIAGNOSIS — I87309 Chronic venous hypertension (idiopathic) without complications of unspecified lower extremity: Secondary | ICD-10-CM | POA: Diagnosis not present

## 2022-05-28 DIAGNOSIS — L02211 Cutaneous abscess of abdominal wall: Secondary | ICD-10-CM | POA: Diagnosis not present

## 2022-05-28 DIAGNOSIS — L98492 Non-pressure chronic ulcer of skin of other sites with fat layer exposed: Secondary | ICD-10-CM | POA: Diagnosis not present

## 2022-05-28 DIAGNOSIS — M069 Rheumatoid arthritis, unspecified: Secondary | ICD-10-CM | POA: Diagnosis not present

## 2022-05-28 DIAGNOSIS — I110 Hypertensive heart disease with heart failure: Secondary | ICD-10-CM | POA: Diagnosis not present

## 2022-05-28 DIAGNOSIS — I5042 Chronic combined systolic (congestive) and diastolic (congestive) heart failure: Secondary | ICD-10-CM | POA: Diagnosis not present

## 2022-05-28 DIAGNOSIS — E11622 Type 2 diabetes mellitus with other skin ulcer: Secondary | ICD-10-CM | POA: Diagnosis not present

## 2022-05-28 DIAGNOSIS — I890 Lymphedema, not elsewhere classified: Secondary | ICD-10-CM | POA: Diagnosis not present

## 2022-05-28 DIAGNOSIS — T8131XA Disruption of external operation (surgical) wound, not elsewhere classified, initial encounter: Secondary | ICD-10-CM | POA: Diagnosis not present

## 2022-05-29 DIAGNOSIS — G8929 Other chronic pain: Secondary | ICD-10-CM | POA: Diagnosis not present

## 2022-05-29 DIAGNOSIS — Z87891 Personal history of nicotine dependence: Secondary | ICD-10-CM | POA: Diagnosis not present

## 2022-05-29 DIAGNOSIS — M069 Rheumatoid arthritis, unspecified: Secondary | ICD-10-CM | POA: Diagnosis not present

## 2022-05-29 DIAGNOSIS — M48061 Spinal stenosis, lumbar region without neurogenic claudication: Secondary | ICD-10-CM | POA: Diagnosis not present

## 2022-05-29 DIAGNOSIS — M545 Low back pain, unspecified: Secondary | ICD-10-CM | POA: Diagnosis not present

## 2022-05-29 DIAGNOSIS — M1990 Unspecified osteoarthritis, unspecified site: Secondary | ICD-10-CM | POA: Diagnosis not present

## 2022-05-29 DIAGNOSIS — F32A Depression, unspecified: Secondary | ICD-10-CM | POA: Diagnosis not present

## 2022-05-29 DIAGNOSIS — E119 Type 2 diabetes mellitus without complications: Secondary | ICD-10-CM | POA: Diagnosis not present

## 2022-05-29 DIAGNOSIS — T8149XD Infection following a procedure, other surgical site, subsequent encounter: Secondary | ICD-10-CM | POA: Diagnosis not present

## 2022-05-29 DIAGNOSIS — I110 Hypertensive heart disease with heart failure: Secondary | ICD-10-CM | POA: Diagnosis not present

## 2022-05-29 DIAGNOSIS — Z96652 Presence of left artificial knee joint: Secondary | ICD-10-CM | POA: Diagnosis not present

## 2022-05-29 DIAGNOSIS — Z7984 Long term (current) use of oral hypoglycemic drugs: Secondary | ICD-10-CM | POA: Diagnosis not present

## 2022-05-29 DIAGNOSIS — M797 Fibromyalgia: Secondary | ICD-10-CM | POA: Diagnosis not present

## 2022-05-29 DIAGNOSIS — I509 Heart failure, unspecified: Secondary | ICD-10-CM | POA: Diagnosis not present

## 2022-05-29 DIAGNOSIS — Z9181 History of falling: Secondary | ICD-10-CM | POA: Diagnosis not present

## 2022-05-29 DIAGNOSIS — I2510 Atherosclerotic heart disease of native coronary artery without angina pectoris: Secondary | ICD-10-CM | POA: Diagnosis not present

## 2022-05-29 DIAGNOSIS — E785 Hyperlipidemia, unspecified: Secondary | ICD-10-CM | POA: Diagnosis not present

## 2022-05-30 ENCOUNTER — Ambulatory Visit: Payer: Self-pay | Admitting: *Deleted

## 2022-05-30 DIAGNOSIS — Z96652 Presence of left artificial knee joint: Secondary | ICD-10-CM | POA: Diagnosis not present

## 2022-05-30 DIAGNOSIS — M797 Fibromyalgia: Secondary | ICD-10-CM | POA: Diagnosis not present

## 2022-05-30 DIAGNOSIS — Z87891 Personal history of nicotine dependence: Secondary | ICD-10-CM | POA: Diagnosis not present

## 2022-05-30 DIAGNOSIS — E785 Hyperlipidemia, unspecified: Secondary | ICD-10-CM | POA: Diagnosis not present

## 2022-05-30 DIAGNOSIS — E119 Type 2 diabetes mellitus without complications: Secondary | ICD-10-CM | POA: Diagnosis not present

## 2022-05-30 DIAGNOSIS — M545 Low back pain, unspecified: Secondary | ICD-10-CM | POA: Diagnosis not present

## 2022-05-30 DIAGNOSIS — I2510 Atherosclerotic heart disease of native coronary artery without angina pectoris: Secondary | ICD-10-CM | POA: Diagnosis not present

## 2022-05-30 DIAGNOSIS — I110 Hypertensive heart disease with heart failure: Secondary | ICD-10-CM | POA: Diagnosis not present

## 2022-05-30 DIAGNOSIS — Z9181 History of falling: Secondary | ICD-10-CM | POA: Diagnosis not present

## 2022-05-30 DIAGNOSIS — T8149XD Infection following a procedure, other surgical site, subsequent encounter: Secondary | ICD-10-CM | POA: Diagnosis not present

## 2022-05-30 DIAGNOSIS — M1990 Unspecified osteoarthritis, unspecified site: Secondary | ICD-10-CM | POA: Diagnosis not present

## 2022-05-30 DIAGNOSIS — Z7984 Long term (current) use of oral hypoglycemic drugs: Secondary | ICD-10-CM | POA: Diagnosis not present

## 2022-05-30 DIAGNOSIS — M069 Rheumatoid arthritis, unspecified: Secondary | ICD-10-CM | POA: Diagnosis not present

## 2022-05-30 DIAGNOSIS — F32A Depression, unspecified: Secondary | ICD-10-CM | POA: Diagnosis not present

## 2022-05-30 DIAGNOSIS — M48061 Spinal stenosis, lumbar region without neurogenic claudication: Secondary | ICD-10-CM | POA: Diagnosis not present

## 2022-05-30 DIAGNOSIS — I509 Heart failure, unspecified: Secondary | ICD-10-CM | POA: Diagnosis not present

## 2022-05-30 DIAGNOSIS — G8929 Other chronic pain: Secondary | ICD-10-CM | POA: Diagnosis not present

## 2022-05-30 NOTE — Telephone Encounter (Signed)
Reason for Disposition  [1] Recent fall AND [2] no injury    Home Health nurse called in to report a fall.    No injuries  Answer Assessment - Initial Assessment Questions 1. MECHANISM: "How did the fall happen?"     Alden Benjamin from [facility].    Pt fell in home last night.   No injuries.   C/o pain 8/10.   Using ice packs for pain. 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 Left shoulder pain is what she is using the ice on.   She has frozen shoulder syndrome.    Pt says she is fine.   Movement is not limited in shoulder. She was stepping down into den to lock door and her foot slipped and it was poorly lit.   Per Alden Benjamin her home is not very well lit.   It's dark and also poor foot wear.  Pt denies any dizziness or tripping prior to falling.  Just her foot slipped in the darkness and the area being poorly lit contributed to the fall.  2. DOMESTIC VIOLENCE AND ELDER ABUSE SCREENING: "Did you fall because someone pushed you or tried to hurt you?" If Yes, ask: "Are you safe now?"     N/A 3. ONSET: "When did the fall happen?" (e.g., minutes, hours, or days ago)     Last night while stepping down to lock her door in a poorly lit area. 4. LOCATION: "What part of the body hit the ground?" (e.g., back, buttocks, head, hips, knees, hands, head, stomach)     Pt denies injuries and Alden Benjamin looked her over and did not see any bruising, swelling or injuries. Shoulder injury she is putting ice on is an old injury.   Not new.   She uses ice on it.   Has frozen shoulder syndrome but her movement is not limited from what it was prior to falling. 5. INJURY: "Did you hurt (injure) yourself when you fell?" If Yes, ask: "What did you injure? Tell me more about this?" (e.g., body area; type of injury; pain severity)"     No 6. PAIN: "Is there any pain?" If Yes, ask: "How bad is the pain?" (e.g., Scale 1-10; or mild,  moderate, severe)   - NONE (0): No pain   - MILD (1-3): Doesn't interfere with normal activities    -  MODERATE (4-7): Interferes with normal activities or awakens from sleep    - SEVERE (8-10): Excruciating pain, unable to do any normal activities      Not from the fall      Just from her frozen shoulder syndrome which is not new. 7. SIZE: For cuts, bruises, or swelling, ask: "How large is it?" (e.g., inches or centimeters)      No 8. PREGNANCY: "Is there any chance you are pregnant?" "When was your last menstrual period?"     N/A 9. OTHER SYMPTOMS: "Do you have any other symptoms?" (e.g., dizziness, fever, weakness; new onset or worsening).      No dizziness or tripping prior to falling. 10. CAUSE: "What do you think caused the fall (or falling)?" (e.g., tripped, dizzy spell)       Foot slipped in a poorly lit area and poor foot wear.  Protocols used: Falls and [facility]

## 2022-05-31 ENCOUNTER — Other Ambulatory Visit: Payer: Self-pay | Admitting: Family Medicine

## 2022-05-31 ENCOUNTER — Telehealth (INDEPENDENT_AMBULATORY_CARE_PROVIDER_SITE_OTHER): Payer: Medicare Other | Admitting: Family Medicine

## 2022-05-31 ENCOUNTER — Encounter: Payer: Self-pay | Admitting: Family Medicine

## 2022-05-31 VITALS — BP 130/78 | Wt 234.0 lb

## 2022-05-31 DIAGNOSIS — F332 Major depressive disorder, recurrent severe without psychotic features: Secondary | ICD-10-CM | POA: Diagnosis not present

## 2022-05-31 DIAGNOSIS — Z5986 Financial insecurity: Secondary | ICD-10-CM

## 2022-05-31 DIAGNOSIS — M25511 Pain in right shoulder: Secondary | ICD-10-CM

## 2022-05-31 DIAGNOSIS — M0579 Rheumatoid arthritis with rheumatoid factor of multiple sites without organ or systems involvement: Secondary | ICD-10-CM

## 2022-05-31 DIAGNOSIS — R45851 Suicidal ideations: Secondary | ICD-10-CM | POA: Diagnosis not present

## 2022-05-31 DIAGNOSIS — M25512 Pain in left shoulder: Secondary | ICD-10-CM | POA: Diagnosis not present

## 2022-05-31 MED ORDER — BUPROPION HCL ER (XL) 450 MG PO TB24: 450.0000 mg | ORAL_TABLET | Freq: Every day | ORAL | 1 refills | Status: DC

## 2022-05-31 MED ORDER — PREDNISONE 20 MG PO TABS: 40.0000 mg | ORAL_TABLET | Freq: Every day | ORAL | 0 refills | Status: AC

## 2022-05-31 NOTE — Telephone Encounter (Signed)
Noted  

## 2022-05-31 NOTE — Assessment & Plan Note (Signed)
Chronic and uncontrolled Grief from end of relationship with fiance, various past traumas, and financial concerns See plan for passive SI as above Continue cymbalta - currently at max dose Increase Wellbutrin to 450 mg daily Pending psych referral - will call back if not plugged in with someone soon

## 2022-05-31 NOTE — Assessment & Plan Note (Signed)
Related to  Uncontrolled depression We are working on getting her plugged in with psych Contracted for safety and discussed resources

## 2022-05-31 NOTE — Progress Notes (Signed)
I,Sulibeya S Dimas,acting as a Education administrator for Lavon Paganini, MD.,have documented all relevant documentation on the behalf of Lavon Paganini, MD,as directed by  Lavon Paganini, MD while in the presence of Lavon Paganini, MD.  MyChart Video Visit    Virtual Visit via Video Note   This visit type was conducted due to national recommendations for restrictions regarding the COVID-19 Pandemic (e.g. social distancing) in an effort to limit this patient's exposure and mitigate transmission in our community. This patient is at least at moderate risk for complications without adequate follow up. This format is felt to be most appropriate for this patient at this time. Physical exam was limited by quality of the video and audio technology used for the visit.    Patient location: home Provider location: [place] involved in the visit: patient, provider   I discussed the limitations of evaluation and management by telemedicine and the availability of in person appointments. The patient expressed understanding and agreed to proceed.  Patient: [name] [name]   DOB: Mar 20, 1951   71 y.o. Female  MRN: [id number] Visit Date: 05/31/2022  Today's healthcare provider: Lavon Paganini, MD   Chief Complaint  Patient presents with   Depression   Anxiety   Shoulder Pain   Subjective    HPI  Depression/anxiety, Follow-up  She  was last seen for this 1 months ago. Changes made at last visit include wellbutrin and cymbalta.   She reports excellent compliance with treatment. She is not having side effects.   She reports excellent tolerance of treatment. Current symptoms include: depressed mood, difficulty concentrating, fatigue, feelings of worthlessness/guilt, hopelessness, and suicidal thoughts without plan She feels she is Worse since last visit.  Reports that she is having financial troubles and her SS check was cut down, so she has no money to buy food for  her animals or gas for her car.  She is calling her Education officer, museum.     05/31/2022    1:49 PM 04/19/2022   11:11 AM 04/02/2022   11:01 AM  Depression screen PHQ 2/9  Decreased Interest '1 3 1  '$ Down, Depressed, Hopeless '3 3 1  '$ PHQ - 2 Score '4 6 2  '$ Altered sleeping 0 0 1  Tired, decreased energy 1 1 0  Change in appetite '1 1 1  '$ Feeling bad or failure about yourself  1 3 0  Trouble concentrating '3 3 1  '$ Moving slowly or fidgety/restless 0 0 0  Suicidal thoughts 1 1 0  PHQ-9 Score '11 15 5  '$ Difficult doing work/chores Not difficult at all Somewhat difficult Not difficult at all       05/31/2022    1:52 PM 04/17/2021    1:29 PM 11/11/2020    8:32 AM  GAD 7 : Generalized Anxiety Score  Nervous, Anxious, on Edge 2 2 0  Control/stop worrying 3 2   Worry too much - different things 1 3 0  Trouble relaxing 1 2 0  Restless 1 1 0  Easily annoyed or irritable 0 0 0  Afraid - awful might happen 2 1 0  Total GAD 7 Score 10 11   Anxiety Difficulty Extremely difficult Somewhat difficult Not difficult at all   -----------------------------------------------------------------------------------------  Patient C/O worsening shoulder pain radiating to upper back and arms. Patient reports she is not taking any medications for pain. Patient reports meloxicam and tylenol do not help with pain. Patient reports reduced range of motion. Patient reports pain is worse with activity.  Doing PT twice weekly without improvement.  Cannot reach overhead with either arm well. Impairs ADLs, IADLs.  Has twitching in hands and shoulders at times.  Has known RA, but no longer seeing Rheum or on DMARD as she was asymptomatic after losing a lot of weight. Does not feel like RA flare.  No injury from recent fall (see phone [facility] from [facility] Did not hit her arms/shoulders.  Medications: Outpatient Medications Prior to Visit  Medication Sig   Biotin w/ Vitamins C & E (HAIR/SKIN/NAILS PO) Take 1 tablet by mouth daily.    carvedilol (COREG) 6.25 MG tablet NEW PRESCRIPTION REQUEST: TAKE ONE TABLET BY MOUTH TWICE DAILY   ciprofloxacin (CIPRO) 250 MG tablet Take 1 tablet by mouth 2 (two) times daily.   DULoxetine (CYMBALTA) 60 MG capsule NEW PRESCRIPTION REQUEST: TAKE ONE CAPSULE BY MOUTH DAILY   ezetimibe (ZETIA) 10 MG tablet TAKE 1 TABLET BY MOUTH EVERY DAY (Patient taking differently: Take 10 mg by mouth daily.)   GEMTESA 75 MG TABS Take 1 tablet by mouth daily.   losartan (COZAAR) 25 MG tablet TAKE 1 TABLET (25 MG TOTAL) BY MOUTH DAILY.   metFORMIN (GLUCOPHAGE) 500 MG tablet NEW PRESCRIPTION REQUEST: TAKE ONE TABLET BY MOUTH TWICE DAILY   polyethylene glycol (MIRALAX / GLYCOLAX) 17 g packet Take 17 g by mouth daily as needed for mild constipation.   pregabalin (LYRICA) 75 MG capsule Take 1 capsule (75 mg total) by mouth 3 (three) times daily.   [DISCONTINUED] buPROPion (WELLBUTRIN XL) 300 MG 24 hr tablet NEW PRESCRIPTION REQUEST: TAKE ONE TABLET BY MOUTH DAILY   meloxicam (MOBIC) 15 MG tablet TAKE 1 TABLET (15 MG TOTAL) BY MOUTH DAILY. (Patient not taking: Reported on 05/31/2022)   tolterodine (DETROL LA) 4 MG 24 hr capsule Take 4 mg by mouth daily. (Patient not taking: Reported on 05/31/2022)   [DISCONTINUED] acetaminophen (TYLENOL) 500 MG tablet Take 1,000 mg by mouth every 6 (six) hours as needed for mild pain. (Patient not taking: Reported on 05/31/2022)   No facility-administered medications prior to visit.    Review of Systems  Constitutional:  Positive for activity change and fatigue.  Respiratory:  Negative for chest tightness and shortness of breath.   Cardiovascular:  Negative for chest pain, palpitations and leg swelling.  Musculoskeletal:  Positive for arthralgias, back pain, joint swelling and myalgias.  Neurological:  Positive for weakness.  Psychiatric/Behavioral:  Positive for decreased concentration and suicidal ideas. Negative for sleep disturbance. The patient is nervous/anxious.          Objective    BP 130/78   Wt 234 lb (106.1 kg)   LMP  (LMP Unknown) Comment: age 60  BMI 36.65 kg/m   BP Readings from Last 3 Encounters:  05/31/22 130/78  04/19/22 130/85  04/13/22 (!) 147/78   Wt Readings from Last 3 Encounters:  05/31/22 234 lb (106.1 kg)  04/19/22 234 lb 1.6 oz (106.2 kg)  04/02/22 231 lb (104.8 kg)     Physical Exam Constitutional:      General: She is not in acute distress.    Appearance: Normal appearance.  HENT:     Head: Normocephalic.  Pulmonary:     Effort: Pulmonary effort is normal. No respiratory distress.  Neurological:     Mental Status: She is alert and oriented to person, place, and time. Mental status is at baseline.        Assessment & Plan     Problem List Items Addressed This Visit  Musculoskeletal and Integument   Rheumatoid arthritis involving multiple sites with positive rheumatoid factor (HCC)    Previously f/b Rheum Has been in remission for several years and not on DMARD Does not feel like her current shoulder pain is related to RA flare      Relevant Medications   predniSONE (DELTASONE) 20 MG tablet     Other   MDD (major depressive disorder)    Chronic and uncontrolled Grief from end of relationship with fiance, various past traumas, and financial concerns See plan for passive SI as above Continue cymbalta - currently at max dose Increase Wellbutrin to 450 mg daily Pending psych referral - will call back if not plugged in with someone soon      Relevant Medications   buPROPion 450 MG TB24   Other Relevant Orders   AMB Referral to [place]   Bilateral shoulder pain    Ongoing since 12/22 and worsening Does not feel like her previous RA flares No improvement with NSAIDs/tylenol Will try prednisone burst Referral to ORtho for further eval/management Continue [facility] PT      Relevant Orders   Ambulatory referral to Orthopedic Surgery   Passive suicidal ideations    Related to   Uncontrolled depression We are working on getting her plugged in with psych Contracted for safety and discussed resources      Relevant Orders   AMB Referral to [place] insecurity - Primary    Ongoing issue since death of her husband She is facing food and housing insecurity This also contributes to her depression Refer to CCM for resources      Relevant Orders   AMB Referral to [place]     Return in about 4 weeks (around 06/28/2022) for MDD/GAD f/u, virtual ok.     I discussed the assessment and treatment plan with the patient. The patient was provided an opportunity to ask questions and all were answered. The patient agreed with the plan and demonstrated an understanding of the instructions.   The patient was advised to call back or seek an in-person evaluation if the symptoms worsen or if the condition fails to improve as anticipated.  I, Lavon Paganini, MD, have reviewed all documentation for this visit. The documentation on 05/31/22 for the exam, diagnosis, procedures, and orders are all accurate and complete.   Zayin Valadez, Dionne Bucy, MD, MPH [facility]

## 2022-05-31 NOTE — Assessment & Plan Note (Signed)
Ongoing since 12/22 and worsening Does not feel like her previous RA flares No improvement with NSAIDs/tylenol Will try prednisone burst Referral to ORtho for further eval/management Continue [facility] PT

## 2022-05-31 NOTE — Assessment & Plan Note (Signed)
Ongoing issue since death of her husband She is facing food and housing insecurity This also contributes to her depression Refer to CCM for resources

## 2022-05-31 NOTE — Telephone Encounter (Signed)
Requested medication (s) are due for refill today:   Yes  Requested medication (s) are on the active medication list:   Yes  Future visit scheduled:   Prescribed today   Last ordered: Today  Insurance will not cover this reason returned.     Requested Prescriptions  Pending Prescriptions Disp Refills   buPROPion HCl ER, XL, 450 MG TB24 [Pharmacy Med Name: BUPROPION HCL XL 450 MG TABLET] 90 tablet 1    Sig: TAKE 1 TABLET BY MOUTH EVERY DAY     Psychiatry: Antidepressants - bupropion Failed - 05/31/2022  2:46 PM      Failed - AST in normal range and within 360 days    AST  Date Value Ref Range Status  04/05/2022 11 (L) 15 - 41 U/L Final         Passed - Cr in normal range and within 360 days    Creatinine  Date Value Ref Range Status  08/31/2014 0.63 0.60 - 1.30 mg/dL Final   Creatinine, Ser  Date Value Ref Range Status  04/05/2022 0.75 0.44 - 1.00 mg/dL Final         Passed - ALT in normal range and within 360 days    ALT  Date Value Ref Range Status  04/05/2022 10 0 - 44 U/L Final         Passed - Completed PHQ-2 or PHQ-9 in the last 360 days      Passed - Last BP in normal range    BP Readings from Last 1 Encounters:  05/31/22 130/78         Passed - Valid encounter within last 6 months    Recent Outpatient Visits           Today Financial insecurity   [facility] [place], Dionne Bucy, MD   1 month ago Encounter for annual wellness visit (AWV) in Medicare patient   [facility] [place], Dionne Bucy, MD   1 month ago Adhesive capsulitis of both shoulders   [facility], Dionne Bucy, MD   4 months ago Biceps tendinitis of right upper extremity   [facility] Mikey Kirschner, PA-C   7 months ago Type 2 diabetes mellitus with diabetic polyneuropathy, without long-term current use of insulin [facility])   [facility], Dionne Bucy, MD

## 2022-05-31 NOTE — Assessment & Plan Note (Signed)
Previously f/b Rheum Has been in remission for several years and not on DMARD Does not feel like her current shoulder pain is related to RA flare

## 2022-06-01 ENCOUNTER — Telehealth: Payer: Self-pay | Admitting: *Deleted

## 2022-06-01 ENCOUNTER — Other Ambulatory Visit: Payer: Self-pay | Admitting: Family Medicine

## 2022-06-01 DIAGNOSIS — I110 Hypertensive heart disease with heart failure: Secondary | ICD-10-CM | POA: Diagnosis not present

## 2022-06-01 DIAGNOSIS — G8929 Other chronic pain: Secondary | ICD-10-CM | POA: Diagnosis not present

## 2022-06-01 DIAGNOSIS — M797 Fibromyalgia: Secondary | ICD-10-CM | POA: Diagnosis not present

## 2022-06-01 DIAGNOSIS — Z7984 Long term (current) use of oral hypoglycemic drugs: Secondary | ICD-10-CM | POA: Diagnosis not present

## 2022-06-01 DIAGNOSIS — Z9181 History of falling: Secondary | ICD-10-CM | POA: Diagnosis not present

## 2022-06-01 DIAGNOSIS — M1990 Unspecified osteoarthritis, unspecified site: Secondary | ICD-10-CM | POA: Diagnosis not present

## 2022-06-01 DIAGNOSIS — F32A Depression, unspecified: Secondary | ICD-10-CM | POA: Diagnosis not present

## 2022-06-01 DIAGNOSIS — M545 Low back pain, unspecified: Secondary | ICD-10-CM | POA: Diagnosis not present

## 2022-06-01 DIAGNOSIS — Z87891 Personal history of nicotine dependence: Secondary | ICD-10-CM | POA: Diagnosis not present

## 2022-06-01 DIAGNOSIS — M48061 Spinal stenosis, lumbar region without neurogenic claudication: Secondary | ICD-10-CM | POA: Diagnosis not present

## 2022-06-01 DIAGNOSIS — Z96652 Presence of left artificial knee joint: Secondary | ICD-10-CM | POA: Diagnosis not present

## 2022-06-01 DIAGNOSIS — I2510 Atherosclerotic heart disease of native coronary artery without angina pectoris: Secondary | ICD-10-CM | POA: Diagnosis not present

## 2022-06-01 DIAGNOSIS — E119 Type 2 diabetes mellitus without complications: Secondary | ICD-10-CM | POA: Diagnosis not present

## 2022-06-01 DIAGNOSIS — T8149XD Infection following a procedure, other surgical site, subsequent encounter: Secondary | ICD-10-CM | POA: Diagnosis not present

## 2022-06-01 DIAGNOSIS — M069 Rheumatoid arthritis, unspecified: Secondary | ICD-10-CM | POA: Diagnosis not present

## 2022-06-01 DIAGNOSIS — E785 Hyperlipidemia, unspecified: Secondary | ICD-10-CM | POA: Diagnosis not present

## 2022-06-01 DIAGNOSIS — I509 Heart failure, unspecified: Secondary | ICD-10-CM | POA: Diagnosis not present

## 2022-06-01 NOTE — Telephone Encounter (Signed)
Requested medications are due for refill today.  yes  Requested medications are on the active medications list.  yes  Last refill. 05/31/2022 #90 1 refill  Future visit scheduled.   yes  Notes to clinic.  Note from pharmacy:Pharmacy comment: Alternative Requested:PRODUCT NOT COVERED.[name] USE XL 300 PLUS XL 150.    Requested Prescriptions  Pending Prescriptions Disp Refills   buPROPion HCl ER, XL, 450 MG TB24 [Pharmacy Med Name: BUPROPION HCL XL 450 MG TABLET] 90 tablet 1    Sig: TAKE 1 TABLET BY MOUTH EVERY DAY     Psychiatry: Antidepressants - bupropion Failed - 06/01/2022  2:30 PM      Failed - AST in normal range and within 360 days    AST  Date Value Ref Range Status  04/05/2022 11 (L) 15 - 41 U/L Final         Passed - Cr in normal range and within 360 days    Creatinine  Date Value Ref Range Status  08/31/2014 0.63 0.60 - 1.30 mg/dL Final   Creatinine, Ser  Date Value Ref Range Status  04/05/2022 0.75 0.44 - 1.00 mg/dL Final         Passed - ALT in normal range and within 360 days    ALT  Date Value Ref Range Status  04/05/2022 10 0 - 44 U/L Final         Passed - Completed PHQ-2 or PHQ-9 in the last 360 days      Passed - Last BP in normal range    BP Readings from Last 1 Encounters:  05/31/22 130/78         Passed - Valid encounter within last 6 months    Recent Outpatient Visits           Yesterday Financial insecurity   [facility] [place], Dionne Bucy, MD   1 month ago Encounter for annual wellness visit (AWV) in Medicare patient   [facility] [place], Dionne Bucy, MD   2 months ago Adhesive capsulitis of both shoulders   [facility] [place], Dionne Bucy, MD   4 months ago Biceps tendinitis of right upper extremity   [facility] Mikey Kirschner, PA-C   7 months ago Type 2 diabetes mellitus with diabetic polyneuropathy, without long-term current use of insulin [facility])   [place], Dionne Bucy, MD       Future Appointments             In 1 month [place], [place], [place], [facility]

## 2022-06-01 NOTE — Chronic Care Management (AMB) (Signed)
  Chronic Care Management   Note  06/01/2022 Name: [name] [name] MRN: [id number] DOB: February 03, 1951  [name] [name] is a 71 y.o. year old female who is a primary care patient of Brita Romp, Dionne Bucy, MD. I reached out to [name] by phone today in response to a referral sent by [name] PCP.  [name] was given information about Chronic Care Management services today including:  CCM service includes personalized support from designated clinical staff supervised by her physician, including individualized plan of care and coordination with other care providers 24/7 contact phone numbers for assistance for urgent and routine care needs. Service will only be billed when office clinical staff spend 20 minutes or more in a month to coordinate care. Only one practitioner may furnish and bill the service in a calendar month. The patient may stop CCM services at any time (effective at the end of the month) by phone call to the office staff. The patient is responsible for co-pay (up to 20% after annual deductible is met) if co-pay is required by the individual health plan.   Patient agreed to services and verbal consent obtained.   Follow up plan: Telephone appointment with care management team member scheduled for: 06/07/2022  Julian Hy, [place] Management  Direct Dial: [phone]

## 2022-06-01 NOTE — Chronic Care Management (AMB) (Signed)
  Chronic Care Management   Outreach Note  06/01/2022 Name: [name] MRN: [id number] DOB: September 21, 1951  [name] [name] is a 71 y.o. year old female who is a primary care patient of Brita Romp, Dionne Bucy, MD. I reached out to [name] by phone today in response to a referral sent by Ms. Adela Glimpse Pauwels's primary care provider.  An unsuccessful telephone outreach was attempted today. The patient was referred to the case management team for assistance with care management and care coordination.   Follow Up Plan: A HIPAA compliant phone message was left for the patient providing contact information and requesting a return call.   Julian Hy, [place] Management  Direct Dial: [phone]

## 2022-06-04 ENCOUNTER — Encounter: Payer: Medicare Other | Admitting: Physician Assistant

## 2022-06-04 DIAGNOSIS — M25512 Pain in left shoulder: Secondary | ICD-10-CM | POA: Diagnosis not present

## 2022-06-04 DIAGNOSIS — M25511 Pain in right shoulder: Secondary | ICD-10-CM | POA: Diagnosis not present

## 2022-06-04 DIAGNOSIS — M542 Cervicalgia: Secondary | ICD-10-CM | POA: Diagnosis not present

## 2022-06-06 DIAGNOSIS — Z9181 History of falling: Secondary | ICD-10-CM | POA: Diagnosis not present

## 2022-06-06 DIAGNOSIS — I2510 Atherosclerotic heart disease of native coronary artery without angina pectoris: Secondary | ICD-10-CM | POA: Diagnosis not present

## 2022-06-06 DIAGNOSIS — G8929 Other chronic pain: Secondary | ICD-10-CM | POA: Diagnosis not present

## 2022-06-06 DIAGNOSIS — M545 Low back pain, unspecified: Secondary | ICD-10-CM | POA: Diagnosis not present

## 2022-06-06 DIAGNOSIS — Z96652 Presence of left artificial knee joint: Secondary | ICD-10-CM | POA: Diagnosis not present

## 2022-06-06 DIAGNOSIS — Z7984 Long term (current) use of oral hypoglycemic drugs: Secondary | ICD-10-CM | POA: Diagnosis not present

## 2022-06-06 DIAGNOSIS — M48061 Spinal stenosis, lumbar region without neurogenic claudication: Secondary | ICD-10-CM | POA: Diagnosis not present

## 2022-06-06 DIAGNOSIS — T8149XD Infection following a procedure, other surgical site, subsequent encounter: Secondary | ICD-10-CM | POA: Diagnosis not present

## 2022-06-06 DIAGNOSIS — F32A Depression, unspecified: Secondary | ICD-10-CM | POA: Diagnosis not present

## 2022-06-06 DIAGNOSIS — M1990 Unspecified osteoarthritis, unspecified site: Secondary | ICD-10-CM | POA: Diagnosis not present

## 2022-06-06 DIAGNOSIS — E785 Hyperlipidemia, unspecified: Secondary | ICD-10-CM | POA: Diagnosis not present

## 2022-06-06 DIAGNOSIS — I509 Heart failure, unspecified: Secondary | ICD-10-CM | POA: Diagnosis not present

## 2022-06-06 DIAGNOSIS — I110 Hypertensive heart disease with heart failure: Secondary | ICD-10-CM | POA: Diagnosis not present

## 2022-06-06 DIAGNOSIS — E119 Type 2 diabetes mellitus without complications: Secondary | ICD-10-CM | POA: Diagnosis not present

## 2022-06-06 DIAGNOSIS — M797 Fibromyalgia: Secondary | ICD-10-CM | POA: Diagnosis not present

## 2022-06-06 DIAGNOSIS — M069 Rheumatoid arthritis, unspecified: Secondary | ICD-10-CM | POA: Diagnosis not present

## 2022-06-06 DIAGNOSIS — Z87891 Personal history of nicotine dependence: Secondary | ICD-10-CM | POA: Diagnosis not present

## 2022-06-07 ENCOUNTER — Telehealth: Payer: Medicare Other

## 2022-06-07 ENCOUNTER — Telehealth: Payer: Self-pay | Admitting: *Deleted

## 2022-06-07 NOTE — Telephone Encounter (Signed)
  Care Management   Follow Up Note   06/07/2022 Name: [name] [name] MRN: [id number] DOB: [birth year]   Referred by: Virginia Crews, MD Reason for referral : Care Coordination   An unsuccessful telephone outreach was attempted today. The patient was referred to the case management team for assistance with care management and care coordination.   Follow Up Plan: Telephone follow up appointment with care management team member to be re-scheduled by careguide.  .cls

## 2022-06-08 DIAGNOSIS — M1990 Unspecified osteoarthritis, unspecified site: Secondary | ICD-10-CM | POA: Diagnosis not present

## 2022-06-08 DIAGNOSIS — M48061 Spinal stenosis, lumbar region without neurogenic claudication: Secondary | ICD-10-CM | POA: Diagnosis not present

## 2022-06-08 DIAGNOSIS — T8149XD Infection following a procedure, other surgical site, subsequent encounter: Secondary | ICD-10-CM | POA: Diagnosis not present

## 2022-06-08 DIAGNOSIS — M069 Rheumatoid arthritis, unspecified: Secondary | ICD-10-CM | POA: Diagnosis not present

## 2022-06-08 DIAGNOSIS — F32A Depression, unspecified: Secondary | ICD-10-CM | POA: Diagnosis not present

## 2022-06-08 DIAGNOSIS — Z87891 Personal history of nicotine dependence: Secondary | ICD-10-CM | POA: Diagnosis not present

## 2022-06-08 DIAGNOSIS — I110 Hypertensive heart disease with heart failure: Secondary | ICD-10-CM | POA: Diagnosis not present

## 2022-06-08 DIAGNOSIS — E119 Type 2 diabetes mellitus without complications: Secondary | ICD-10-CM | POA: Diagnosis not present

## 2022-06-08 DIAGNOSIS — Z7984 Long term (current) use of oral hypoglycemic drugs: Secondary | ICD-10-CM | POA: Diagnosis not present

## 2022-06-08 DIAGNOSIS — I509 Heart failure, unspecified: Secondary | ICD-10-CM | POA: Diagnosis not present

## 2022-06-08 DIAGNOSIS — E785 Hyperlipidemia, unspecified: Secondary | ICD-10-CM | POA: Diagnosis not present

## 2022-06-08 DIAGNOSIS — I2510 Atherosclerotic heart disease of native coronary artery without angina pectoris: Secondary | ICD-10-CM | POA: Diagnosis not present

## 2022-06-08 DIAGNOSIS — Z96652 Presence of left artificial knee joint: Secondary | ICD-10-CM | POA: Diagnosis not present

## 2022-06-08 DIAGNOSIS — M545 Low back pain, unspecified: Secondary | ICD-10-CM | POA: Diagnosis not present

## 2022-06-08 DIAGNOSIS — Z9181 History of falling: Secondary | ICD-10-CM | POA: Diagnosis not present

## 2022-06-08 DIAGNOSIS — M797 Fibromyalgia: Secondary | ICD-10-CM | POA: Diagnosis not present

## 2022-06-08 DIAGNOSIS — G8929 Other chronic pain: Secondary | ICD-10-CM | POA: Diagnosis not present

## 2022-06-11 ENCOUNTER — Telehealth: Payer: Self-pay | Admitting: *Deleted

## 2022-06-11 ENCOUNTER — Ambulatory Visit: Payer: Medicare Other | Admitting: Physician Assistant

## 2022-06-11 NOTE — Telephone Encounter (Signed)
Received on (06/05/22) via of fax Request for Discharge Orders from [facility].  Requesting signature,date, and return.  Given to provider to sign.   Discharge orders signed,dated, and faxed back to [facility].  Confirmation received and copy scanned into the chart.//AB/CMA

## 2022-06-13 DIAGNOSIS — M545 Low back pain, unspecified: Secondary | ICD-10-CM | POA: Diagnosis not present

## 2022-06-13 DIAGNOSIS — Z9181 History of falling: Secondary | ICD-10-CM | POA: Diagnosis not present

## 2022-06-13 DIAGNOSIS — M48061 Spinal stenosis, lumbar region without neurogenic claudication: Secondary | ICD-10-CM | POA: Diagnosis not present

## 2022-06-13 DIAGNOSIS — T8149XD Infection following a procedure, other surgical site, subsequent encounter: Secondary | ICD-10-CM | POA: Diagnosis not present

## 2022-06-13 DIAGNOSIS — E785 Hyperlipidemia, unspecified: Secondary | ICD-10-CM | POA: Diagnosis not present

## 2022-06-13 DIAGNOSIS — I509 Heart failure, unspecified: Secondary | ICD-10-CM | POA: Diagnosis not present

## 2022-06-13 DIAGNOSIS — I110 Hypertensive heart disease with heart failure: Secondary | ICD-10-CM | POA: Diagnosis not present

## 2022-06-13 DIAGNOSIS — M1990 Unspecified osteoarthritis, unspecified site: Secondary | ICD-10-CM | POA: Diagnosis not present

## 2022-06-13 DIAGNOSIS — I2510 Atherosclerotic heart disease of native coronary artery without angina pectoris: Secondary | ICD-10-CM | POA: Diagnosis not present

## 2022-06-13 DIAGNOSIS — M797 Fibromyalgia: Secondary | ICD-10-CM | POA: Diagnosis not present

## 2022-06-13 DIAGNOSIS — E119 Type 2 diabetes mellitus without complications: Secondary | ICD-10-CM | POA: Diagnosis not present

## 2022-06-13 DIAGNOSIS — G8929 Other chronic pain: Secondary | ICD-10-CM | POA: Diagnosis not present

## 2022-06-13 DIAGNOSIS — F32A Depression, unspecified: Secondary | ICD-10-CM | POA: Diagnosis not present

## 2022-06-13 DIAGNOSIS — Z87891 Personal history of nicotine dependence: Secondary | ICD-10-CM | POA: Diagnosis not present

## 2022-06-13 DIAGNOSIS — Z96652 Presence of left artificial knee joint: Secondary | ICD-10-CM | POA: Diagnosis not present

## 2022-06-13 DIAGNOSIS — Z7984 Long term (current) use of oral hypoglycemic drugs: Secondary | ICD-10-CM | POA: Diagnosis not present

## 2022-06-13 DIAGNOSIS — M069 Rheumatoid arthritis, unspecified: Secondary | ICD-10-CM | POA: Diagnosis not present

## 2022-06-14 ENCOUNTER — Other Ambulatory Visit: Payer: Self-pay | Admitting: Family Medicine

## 2022-06-15 DIAGNOSIS — Z96652 Presence of left artificial knee joint: Secondary | ICD-10-CM | POA: Diagnosis not present

## 2022-06-15 DIAGNOSIS — M545 Low back pain, unspecified: Secondary | ICD-10-CM | POA: Diagnosis not present

## 2022-06-15 DIAGNOSIS — T8149XD Infection following a procedure, other surgical site, subsequent encounter: Secondary | ICD-10-CM | POA: Diagnosis not present

## 2022-06-15 DIAGNOSIS — M797 Fibromyalgia: Secondary | ICD-10-CM | POA: Diagnosis not present

## 2022-06-15 DIAGNOSIS — E119 Type 2 diabetes mellitus without complications: Secondary | ICD-10-CM | POA: Diagnosis not present

## 2022-06-15 DIAGNOSIS — M069 Rheumatoid arthritis, unspecified: Secondary | ICD-10-CM | POA: Diagnosis not present

## 2022-06-15 DIAGNOSIS — F32A Depression, unspecified: Secondary | ICD-10-CM | POA: Diagnosis not present

## 2022-06-15 DIAGNOSIS — Z9181 History of falling: Secondary | ICD-10-CM | POA: Diagnosis not present

## 2022-06-15 DIAGNOSIS — M48061 Spinal stenosis, lumbar region without neurogenic claudication: Secondary | ICD-10-CM | POA: Diagnosis not present

## 2022-06-15 DIAGNOSIS — E785 Hyperlipidemia, unspecified: Secondary | ICD-10-CM | POA: Diagnosis not present

## 2022-06-15 DIAGNOSIS — G8929 Other chronic pain: Secondary | ICD-10-CM | POA: Diagnosis not present

## 2022-06-15 DIAGNOSIS — Z7984 Long term (current) use of oral hypoglycemic drugs: Secondary | ICD-10-CM | POA: Diagnosis not present

## 2022-06-15 DIAGNOSIS — M1990 Unspecified osteoarthritis, unspecified site: Secondary | ICD-10-CM | POA: Diagnosis not present

## 2022-06-15 DIAGNOSIS — I110 Hypertensive heart disease with heart failure: Secondary | ICD-10-CM | POA: Diagnosis not present

## 2022-06-15 DIAGNOSIS — Z87891 Personal history of nicotine dependence: Secondary | ICD-10-CM | POA: Diagnosis not present

## 2022-06-15 DIAGNOSIS — I2510 Atherosclerotic heart disease of native coronary artery without angina pectoris: Secondary | ICD-10-CM | POA: Diagnosis not present

## 2022-06-15 DIAGNOSIS — I509 Heart failure, unspecified: Secondary | ICD-10-CM | POA: Diagnosis not present

## 2022-06-18 ENCOUNTER — Encounter: Payer: Medicare Other | Attending: Physician Assistant | Admitting: Physician Assistant

## 2022-06-18 DIAGNOSIS — I890 Lymphedema, not elsewhere classified: Secondary | ICD-10-CM | POA: Diagnosis not present

## 2022-06-18 DIAGNOSIS — L02211 Cutaneous abscess of abdominal wall: Secondary | ICD-10-CM | POA: Insufficient documentation

## 2022-06-18 DIAGNOSIS — E1151 Type 2 diabetes mellitus with diabetic peripheral angiopathy without gangrene: Secondary | ICD-10-CM | POA: Insufficient documentation

## 2022-06-18 DIAGNOSIS — I110 Hypertensive heart disease with heart failure: Secondary | ICD-10-CM | POA: Diagnosis not present

## 2022-06-18 DIAGNOSIS — I5042 Chronic combined systolic (congestive) and diastolic (congestive) heart failure: Secondary | ICD-10-CM | POA: Insufficient documentation

## 2022-06-18 DIAGNOSIS — L98492 Non-pressure chronic ulcer of skin of other sites with fat layer exposed: Secondary | ICD-10-CM | POA: Diagnosis not present

## 2022-06-18 DIAGNOSIS — M069 Rheumatoid arthritis, unspecified: Secondary | ICD-10-CM | POA: Insufficient documentation

## 2022-06-18 DIAGNOSIS — E11622 Type 2 diabetes mellitus with other skin ulcer: Secondary | ICD-10-CM | POA: Insufficient documentation

## 2022-06-19 ENCOUNTER — Ambulatory Visit: Payer: Medicare Other | Admitting: *Deleted

## 2022-06-19 DIAGNOSIS — Z5986 Financial insecurity: Secondary | ICD-10-CM

## 2022-06-19 DIAGNOSIS — E119 Type 2 diabetes mellitus without complications: Secondary | ICD-10-CM

## 2022-06-19 DIAGNOSIS — F332 Major depressive disorder, recurrent severe without psychotic features: Secondary | ICD-10-CM

## 2022-06-19 NOTE — Progress Notes (Signed)
Carriger, LADELL BEY ([id number]) Visit Report for 06/18/2022 Chief Complaint Document Details Patient Name: [name] [name], [name] Date of Service: 06/18/2022 11:00 AM Medical Record Number: [id number] Patient Account Number: [id number] Date of Birth/Sex: Jan 31, 1951 (71 y.o. F) Treating RN: Carlene Coria Primary Care Provider: Lavon Paganini Other Clinician: Referring Provider: Lavon Paganini Treating Provider/Extender: Skipper Cliche in Treatment: 6 Information Obtained from: Patient Chief Complaint Reopened/Abscessed Surgical Wound on the Lower Abdominal Wall Electronic Signature(s) Signed: 06/18/2022 12:05:59 PM By: Worthy Keeler PA-C Entered By: Worthy Keeler on 06/18/2022 12:05:59 Coulthard, Mikesha D. ([id number]) -------------------------------------------------------------------------------- HPI Details Patient Name: [name] [name], [name] Date of Service: 06/18/2022 11:00 AM Medical Record Number: [id number] Patient Account Number: [id number] Date of Birth/Sex: [birth year] (71 y.o. F) Treating RN: Carlene Coria Primary Care Provider: Lavon Paganini Other Clinician: Referring Provider: Lavon Paganini Treating Provider/Extender: Skipper Cliche in Treatment: 6 History of Present Illness Location: left lower extremity Severity: improved since her initial hospitalization but stable since Duration: 6 weeks Context: began after her third treatment of Remicade for rheumatoid arthritis Modifying Factors: morbid obesity, chronic venous hypertension, lymphedema, diabetes mellitus type 2, methotrexate use, Remicade use, Associated Signs and Symptoms: pain and swelling left lower extremity HPI Description: she was seen last week where a 2 layer light compression system was applied. She removed that on Sunday because of itching. She states that she did see her primary care physician who has prescribed her a stronger diuretic and she will begin that today. When she removed her dressing she  applied some Neosporin and some gauze. There remains considerable concern about her ability to keep the wrap in place for a week she has been prescribed lymphedema pumps but she does not use those either. We will encourage her to use those twice a day for an hour each time. These were prescribed to her by Dr. Hinton Lovely READMISSION 01/04/17; this is a patient who is a type II diabetic on oral agents. I note she was seen in this clinic 2-3 years ago. I was not involved in her care at that point. She tells [name] that she is had ulcers on her lateral left lower extremity since just before Christmas. There was no obvious precipitant to this. She apparently has been on long-term suppressive penicillin prescribed by Dr. 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 She has noted increasing erythema along with all of this and I think this is what prompted her to come in today. She does not have a known history of PAD or neuropathy ABI in this clinic was 1.06 on the left. The patient has not been requiring any compression on her legs although she does have compression stockings at home as well as external compression pumps. She has been using neither of these. She has not been systemically unwell the patient is also known to the local vein and vascular group. Apparently her external compression pumps were previously prescribed by Dr. Delana Meyer. She has had apparent bilateral ablations done in the past although I have none of these records. 01/15/17; the patient returns for rewrap of her left leg last week and reports that was put on too tight, she had to remove it on Sunday. She states this actually occurred her even though the previous 3 lateral wrap she was able to tolerate well. I don't think there is been much in the way of expansion of the erythema on the  lateral left leg lateral left heel and lateral left  foot. As mentioned previously I think this is chronic venous insufficiency and not really an active cellulitis. She has 2 small open areas in the lower left calf 01/22/17; the patient complains of really generalized pain. She has rheumatoid arthritis as well as fibromyalgia. She had to take the wrap off yesterday. She has small open areas on the lateral left heel and foot. 02/01/2017 -- the patient is seen by me today as she's had compression wraps for 10 days and I understand she did not realize she had to come back to see [name]. She did see Dr. Delana Meyer yesterday and his notes are not in, but the patient tells [name] that he is going to recommend a lymphedema pump and has not recommended any further venous workup or intervention. 02/06/17; the patient went to see Dr. Hinton Lovely of vascular surgery although I don't remember specifically being involved with this discussion. He felt she had chronic venous insufficiency without surgery or intervention necessary at this time. Also noted lymphedema and type 2 diabetes. In the meantime she did not tolerate the wrap on the right leg stating that the small wound anteriorly" burns like fire" 02/13/17; the lesions on the left lateral and left medial leg have closed over. Still looks a little vulnerable on the lateral aspect on the left Right; still an open area here but smaller and appears to be well granulated. Using Silver Collegen. She uses her own wrap on the right leg last week and I think week and continue that on both legs this week 02/26/17; the lesion on the left lateral leg and left medial leg are both closed over. Still to open areas on the right medial leg. She is been using her own stockings and the edema control looks adequate 03/12/17 she has no open areas on the left lateral or left medial leg. She still has an open area on the right medial leg and last week we do find an area over the right medial malleolus. She is been to see vascular surgery who did not  recommend any further venous workup. She has lymphedema pumps which she uses although edema doesn't appear to be a big part of her current nonhealing state. She has been using Prisma Readmission: 05-07-2022 upon evaluation today patient presents for initial inspection here in our clinic concerning a surgical wound over the lower abdominal area. She had a panniculectomy which was performed on 02-05-2022 by Dr. Audelia Hives. Initially the surgery with Dr. Marla Roe was successful seemingly until the patient was seen initially on 03-02-2022 for ED to hospital admission due to cellulitis in this area. She subsequently had surgery which was irrigation and debridement with wound VAC placement that was on 03-04-2022. Unfortunately the wound VAC was never really significantly successful and the patient had a second hospital admission on 03-13-2022. She has had visit to the ER since on 04-05-2022. Eventually she decided to put in a referral herself to come in for further evaluation here at the wound center. She is having a hard time getting this to heal and to be honest wanted some help. With that being said she is now outside of her 90-day postop global period which is great news. All things being said I do think this is good to be a fairly complicated process to heal due to the fact that there are some significant tunnel areas at 3:00 and 9:00 along with the depth straight in. The patient is aware that  this is probably get a take quite a bit of time but nonetheless does want help in doing the best thing possible to get it to heal as quickly as possible and I think that we can definitely help with that. Patient does have a history of diabetes her most recent hemoglobin A1c was 7.4 and that was on 03-03-2022. This was a little higher than her previous 2 readings at 3 and 6 months which were at 6.8. Otherwise the patient does have a history of hypertension as well as congestive heart failure as well. She seems to be  doing okay in regard to those issues. 05-14-2022 upon evaluation today patient appears to be doing well currently in regard to her wound although she has not had the [facility] this was not used by home health they said they could not find her on the formulary. I know we have had patients in the past with [facility] did use Hydrofera Blue however. Nonetheless I think that we already need to go that way she states that it seemed to do much better than the Garrelts, Khaliah D. ([id number]) alginate which just does not catch and is much of the drainage at this point. Overall I do not see any signs of active infection locally or systemically at this time. 05-21-2022 upon evaluation today patient appears to be doing well currently in regard to her wound. I am actually very pleased with where things stand I think the Hydrofera Blue blue is doing a pretty good job. With that being said I do think that she is going to need to have the [facility] ready transfer not just the ready at this point. It has a backing on it the wound is being used and has not really can help her significantly at this point. 05-28-2022 upon evaluation today patient appears to be doing well currently in regard to her wound this is definitely showing signs of improvement I did review her culture as well and she is on Cipro which is the appropriate medication she did indeed have a Pseudomonas infection which is what we presume last week as well. 06-18-2022 upon evaluation today patient appears resents for reevaluation in the clinic she is actually doing much better. The size of the wound is improved but more importantly the undermining area specifically the tunneling that went towards the left side of her body has pretty much completely filled in. This is great news and I do not see any signs of active infection locally or systemically at this time which is excellent. Electronic Signature(s) Signed: 06/18/2022 2:48:57 PM By: Worthy Keeler PA-C Entered By: Worthy Keeler on 06/18/2022 14:48:57 Yaney, Amand D. ([id number]) -------------------------------------------------------------------------------- Otelia Sergeant TISS Details Patient Name: [name] [name], [name] Date of Service: 06/18/2022 11:00 AM Medical Record Number: [id number] Patient Account Number: [id number] Date of Birth/Sex: 12/02/1951 (72 y.o. F) Treating RN: Carlene Coria Primary Care Provider: Lavon Paganini Other Clinician: Referring Provider: Lavon Paganini Treating Provider/Extender: Skipper Cliche in Treatment: 6 Procedure Performed for: Wound #9 Right Abdomen - Lower Quadrant Performed By: Physician Tommie Sams., PA-C Post Procedure Diagnosis Same as Pre-procedure Notes silver nitrate Electronic Signature(s) Signed: 06/19/2022 8:33:55 AM By: Carlene Coria RN Entered By: Carlene Coria on 06/18/2022 11:38:36 [name] [name], [name] ([id number]) -------------------------------------------------------------------------------- Physical Exam Details Patient Name: Filosa, Chardonay D. Date of Service: 06/18/2022 11:00 AM Medical Record Number: [id number] Patient Account Number: [id number] Date of Birth/Sex: July 03, 1951 (71 y.o. F) Treating RN: Carlene Coria Primary Care Provider: Brita Romp,  Levada Dy Other Clinician: Referring Provider: Lavon Paganini Treating Provider/Extender: Skipper Cliche in Treatment: 6 Constitutional Well-nourished and well-hydrated in no acute distress. Respiratory normal breathing without difficulty. Psychiatric this patient is able to make decisions and demonstrates good insight into disease process. Alert and Oriented x 3. pleasant and cooperative. Notes Upon inspection patient's wound did not appear to be infected and overall is doing quite well. I am not seeing any signs of active infection locally or systemically which is great news and overall I think we are on the right track here. Electronic  Signature(s) Signed: 06/18/2022 2:49:21 PM By: Worthy Keeler PA-C Entered By: Worthy Keeler on 06/18/2022 14:49:21 Mathe, Loree D. ([id number]) -------------------------------------------------------------------------------- Physician Orders Details Patient Name: [name] [name], [name] Date of Service: 06/18/2022 11:00 AM Medical Record Number: [id number] Patient Account Number: [id number] Date of Birth/Sex: [birth year] (71 y.o. F) Treating RN: Carlene Coria Primary Care Provider: Lavon Paganini Other Clinician: Referring Provider: Lavon Paganini Treating Provider/Extender: Skipper Cliche in Treatment: 6 Verbal / Phone Orders: No Diagnosis Coding Follow-up Appointments o Return Appointment in 1 week. [place] for wound care. May utilize formulary equivalent dressing for wound treatment orders unless otherwise specified. Home Health Nurse may visit [facility] to address patientos wound care needs. Jackquline [place] [phone] o **Please direct any NON-WOUND related issues/requests for orders to patient's Primary Care Physician. **If current dressing causes regression in wound condition, may D/C ordered dressing product/s and apply Normal Saline Moist Dressing daily until next [place] or Other MD appointment. **Notify [facility] of regression in wound condition at [phone]. Bathing/ Shower/ Hygiene o May shower; gently cleanse wound with antibacterial soap, rinse and pat dry prior to dressing wounds - on days dressing changed Wound Treatment Wound #9 - Abdomen - Lower Quadrant Wound Laterality: Right Topical: Nystop Nystatin Powder, 15 (g) bottle 3 x Per Week/30 Days Primary Dressing: Hydrofera Blue Ready Transfer Foam, 4x5 (in/in) 3 x Per Week/30 Days Discharge Instructions: PACK LIGHTLY Secondary Dressing: ABD Pad 5x9 (in/in) 3 x Per Week/30 Days Discharge Instructions: Cover with ABD pad Secured With: Medipore Tape - 55M Medipore  H Soft Cloth Surgical Tape, 2x2 (in/yd) 3 x Per Week/30 Days Electronic Signature(s) Signed: 06/19/2022 8:33:55 AM By: Carlene Coria RN Signed: 06/19/2022 5:53:34 PM By: Worthy Keeler PA-C Entered By: Carlene Coria on 06/18/2022 11:36:09 Porada, Elaine D. ([id number]) -------------------------------------------------------------------------------- Problem List Details Patient Name: [name] [name], [name] Date of Service: 06/18/2022 11:00 AM Medical Record Number: [id number] Patient Account Number: [id number] Date of Birth/Sex: 1951-07-20 (71 y.o. 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 ([id number]) -------------------------------------------------------------------------------- Progress Note Details Patient Name: Aki, [name] Date of Service: 06/18/2022 11:00 AM Medical Record Number: [id number] Patient Account Number: [id number] Date of Birth/Sex: 1951/04/04 (  71 y.o. F) Treating RN: Carlene Coria Primary Care Provider: Lavon Paganini Other Clinician: Referring Provider: Lavon Paganini Treating Provider/Extender: Skipper Cliche in Treatment: 6 Subjective Chief Complaint Information obtained from Patient Reopened/Abscessed Surgical Wound on the Lower Abdominal Wall History of Present Illness (HPI) The following HPI elements were documented for the patient's wound: Location: left lower extremity Severity: improved since her initial hospitalization but stable since Duration: 6 weeks Context: began after her third treatment of Remicade for rheumatoid arthritis Modifying Factors: morbid obesity, chronic venous hypertension, lymphedema, diabetes mellitus type 2, methotrexate use, Remicade use, Associated Signs and Symptoms: pain and swelling left lower extremity she was seen last week where a 2 layer light compression system was applied. She removed that on Sunday because of itching. She states that she did see her primary care physician who has prescribed her a stronger diuretic and she will begin that today. When she removed her dressing she applied some Neosporin and some gauze. There remains considerable concern about her ability to keep the wrap in place for a week she has been prescribed lymphedema pumps but she does not use those either. We will encourage her to use those twice a day for an hour each time. These were prescribed to her by Dr. Hinton Lovely READMISSION 01/04/17; this is a patient who is a type II diabetic on oral agents. I note she was seen in this clinic 2-3 years ago. I was not involved in her care at that point. She tells [name] that she is had ulcers on her lateral left lower extremity since just before Christmas. There was no obvious precipitant to this. She apparently has been on long-term suppressive penicillin prescribed by Dr. 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 She has noted increasing erythema along with all of this and I think this  is what prompted her to come in today. She does not have a known history of PAD or neuropathy ABI in this clinic was 1.06 on the left. The patient has not been requiring any compression on her legs although she does have compression stockings at home as well as external compression pumps. She has been using neither of these. She has not been systemically unwell the patient is also known to the local vein and vascular group. Apparently her external compression pumps were previously prescribed by Dr. Delana Meyer. She has had apparent bilateral ablations done in the past although I have none of these records. 01/15/17; the patient returns for rewrap of her left leg last week and reports that was put on too tight, she had to remove it on Sunday. She states this actually occurred her even though the previous 3 lateral wrap she was able to tolerate well. I don't think there is been much in the way of expansion of the erythema on the lateral left leg lateral left heel and lateral left foot. As mentioned previously I think this is chronic venous insufficiency and not really an active cellulitis. She has 2 small open areas in the lower left calf 01/22/17; the patient complains of really generalized pain. She has rheumatoid arthritis as well as fibromyalgia. She had to take the wrap off yesterday. She has small open areas on the lateral left heel and foot. 02/01/2017 -- the patient is seen by me today as she's had compression wraps for 10 days and I understand she did not realize she had  to come back to see [name]. She did see Dr. Delana Meyer yesterday and his notes are not in, but the patient tells [name] that he is going to recommend a lymphedema pump and has not recommended any further venous workup or intervention. 02/06/17; the patient went to see Dr. Hinton Lovely of vascular surgery although I don't remember specifically being involved with this discussion. He felt she had chronic venous insufficiency without surgery or  intervention necessary at this time. Also noted lymphedema and type 2 diabetes. In the meantime she did not tolerate the wrap on the right leg stating that the small wound anteriorly" burns like fire" 02/13/17; the lesions on the left lateral and left medial leg have closed over. Still looks a little vulnerable on the lateral aspect on the left Right; still an open area here but smaller and appears to be well granulated. Using Silver Collegen. She uses her own wrap on the right leg last week and I think week and continue that on both legs this week 02/26/17; the lesion on the left lateral leg and left medial leg are both closed over. Still to open areas on the right medial leg. She is been using her own stockings and the edema control looks adequate 03/12/17 she has no open areas on the left lateral or left medial leg. She still has an open area on the right medial leg and last week we do find an area over the right medial malleolus. She is been to see vascular surgery who did not recommend any further venous workup. She has lymphedema pumps which she uses although edema doesn't appear to be a big part of her current nonhealing state. She has been using Prisma Readmission: 05-07-2022 upon evaluation today patient presents for initial inspection here in our clinic concerning a surgical wound over the lower abdominal area. She had a panniculectomy which was performed on 02-05-2022 by Dr. Audelia Hives. Initially the surgery with Dr. Marla Roe was successful seemingly until the patient was seen initially on 03-02-2022 for ED to hospital admission due to cellulitis in this area. She subsequently had surgery which was irrigation and debridement with wound VAC placement that was on 03-04-2022. Unfortunately the wound VAC was never really significantly successful and the patient had a second hospital admission on 03-13-2022. She has had visit to the ER since on 04-05-2022. Eventually she decided to put in a  referral herself to come in for further evaluation here at the wound center. She is having a hard time getting this to heal and to be honest wanted some help. With that being said she is now outside of her 90-day postop global period which is great news. All things being said I do think this is good to be a fairly complicated process to heal due to the fact that there are some significant tunnel areas at 3:00 and 9:00 along with the depth straight in. The patient is aware that this is probably get a take quite a bit of time but nonetheless does want help in doing the best thing possible to get it to heal as quickly as possible and I think that we can definitely help with that. Hoelzer, Ahmari D. ([id number]) Patient does have a history of diabetes her most recent hemoglobin A1c was 7.4 and that was on 03-03-2022. This was a little higher than her previous 2 readings at 3 and 6 months which were at 6.8. Otherwise the patient does have a history of hypertension as well as congestive heart failure as  well. She seems to be doing okay in regard to those issues. 05-14-2022 upon evaluation today patient appears to be doing well currently in regard to her wound although she has not had the [facility] this was not used by home health they said they could not find her on the formulary. I know we have had patients in the past with [facility] did use Hydrofera Blue however. Nonetheless I think that we already need to go that way she states that it seemed to do much better than the alginate which just does not catch and is much of the drainage at this point. Overall I do not see any signs of active infection locally or systemically at this time. 05-21-2022 upon evaluation today patient appears to be doing well currently in regard to her wound. I am actually very pleased with where things stand I think the Hydrofera Blue blue is doing a pretty good job. With that being said I do think that she is going to need to have  the [facility] ready transfer not just the ready at this point. It has a backing on it the wound is being used and has not really can help her significantly at this point. 05-28-2022 upon evaluation today patient appears to be doing well currently in regard to her wound this is definitely showing signs of improvement I did review her culture as well and she is on Cipro which is the appropriate medication she did indeed have a Pseudomonas infection which is what we presume last week as well. 06-18-2022 upon evaluation today patient appears resents for reevaluation in the clinic she is actually doing much better. The size of the wound is improved but more importantly the undermining area specifically the tunneling that went towards the left side of her body has pretty much completely filled in. This is great news and I do not see any signs of active infection locally or systemically at this time which is excellent. Objective Constitutional Well-nourished and well-hydrated in no acute distress. Vitals Time Taken: 11:05 AM, Height: 67 in, Weight: 230 lbs, BMI: 36, Temperature: 98 F, Pulse: 88 bpm, Respiratory Rate: 18 breaths/min, Blood Pressure: 152/74 mmHg. Respiratory normal breathing without difficulty. Psychiatric this patient is able to make decisions and demonstrates good insight into disease process. Alert and Oriented x 3. pleasant and cooperative. General Notes: Upon inspection patient's wound did not appear to be infected and overall is doing quite well. I am not seeing any signs of active infection locally or systemically which is great news and overall I think we are on the right track here. Integumentary (Hair, Skin) Wound #9 status is Open. Original cause of wound was Gradually Appeared. The date acquired was: 01/24/2022. The wound has been in treatment 6 weeks. The wound is located on the Right Abdomen - Lower Quadrant. The wound measures 0.5cm length x 2.8cm width x 1.5cm  depth; 1.1cm^2 area and 1.649cm^3 volume. There is Fat Layer (Subcutaneous Tissue) exposed. There is no tunneling or undermining noted. There is a large amount of serosanguineous drainage noted. There is large (67-100%) red granulation within the wound bed. There is no necrotic tissue within the wound bed. 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 ([id number])  Procedures Wound #9 Pre-procedure diagnosis of Wound #9 is an Abscess located on the Right Abdomen - Lower Quadrant . An CHEM CAUT GRANULATION TISS procedure was performed by Tommie Sams., PA-C. Post procedure Diagnosis Wound #9: Same as Pre-Procedure Notes: silver nitrate Plan Follow-up Appointments: Return Appointment in 1 week. Home Health: [facility] for wound care. May utilize formulary equivalent dressing for wound treatment orders unless otherwise specified. Home Health Nurse may visit PRN to address patient s wound care needs. Jackquline Denmark [phone] **Please direct any NON-WOUND related issues/requests for orders to patient's Primary Care Physician. **If current dressing causes regression in wound condition, may D/C ordered dressing product/s and apply Normal Saline Moist Dressing daily until next [facility] or Other MD appointment. **Notify [facility] of regression in wound condition at [phone]. Bathing/ Shower/ Hygiene: May shower; gently cleanse wound with antibacterial soap, rinse and pat dry prior to dressing wounds - on days dressing changed WOUND #9: - Abdomen - Lower Quadrant Wound Laterality: Right Topical:  Nystop Nystatin Powder, 15 (g) bottle 3 x Per Week/30 Days Primary Dressing: Hydrofera Blue Ready Transfer Foam, 4x5 (in/in) 3 x Per Week/30 Days Discharge Instructions: PACK LIGHTLY Secondary Dressing: ABD Pad 5x9 (in/in) 3 x Per Week/30 Days Discharge Instructions: Cover with ABD pad Secured With: Medipore Tape - 63M Medipore H Soft Cloth Surgical Tape, 2x2 (in/yd) 3 x Per Week/30 Days 1. I would recommend that we going continue with the wound care measures as before and the patient is in agreement with plan this includes the [facility] we are still using the nystatin which I think is okay as well. 2. I am also going to suggest that she continue to use the ABD pads or change in the outer pad daily in order to keep this from getting too wet. We will see patient back for reevaluation in 1 week here in the clinic. If anything worsens or changes patient will contact our office for additional recommendations. Electronic Signature(s) Signed: 06/18/2022 2:49:44 PM By: Worthy Keeler PA-C Entered By: Worthy Keeler on 06/18/2022 14:49:43 Montalvo, Jode D. ([id number]) -------------------------------------------------------------------------------- SuperBill Details Patient Name: Fargo, Laurielle D. Date of Service: 06/18/2022 Medical Record Number: [id number] Patient Account Number: [id number] Date of Birth/Sex: 10/04/1951 (71 y.o. F) Treating RN: Carlene Coria Primary Care Provider: Lavon Paganini Other Clinician: Referring Provider: Lavon Paganini Treating Provider/Extender: Skipper Cliche in Treatment: 6 Diagnosis Coding ICD-10 Codes Code Description T81.31XA Disruption of external operation (surgical) wound, not elsewhere classified, initial encounter L02.211 Cutaneous abscess of abdominal wall L98.492 Non-pressure chronic ulcer of skin of other sites with fat layer exposed I50.42 Chronic combined systolic (congestive) and diastolic (congestive) heart failure E11.622 Type 2  diabetes mellitus with other skin ulcer I10 Essential (primary) hypertension Facility Procedures CPT4 Code: 03212248 Description: 25003 - CHEM CAUT GRANULATION TISS Modifier: Quantity: 1 CPT4 Code: Description: ICD-10 Diagnosis Description T81.31XA Disruption of external operation (surgical) wound, not elsewhere classifie Modifier: d, initial encounter Quantity: Physician Procedures CPT4 Code: 7048889 Description: 16945 - WC PHYS CHEM CAUT GRAN TISSUE Modifier: Quantity: 1 CPT4 Code: Description: ICD-10 Diagnosis Description T81.31XA Disruption of external operation (surgical) wound, not elsewhere classified Modifier: , initial encounter Quantity: Electronic Signature(s) Signed: 06/18/2022 2:50:10 PM By: Worthy Keeler PA-C Entered By: Worthy Keeler on 06/18/2022 14:50:10

## 2022-06-19 NOTE — Patient Instructions (Signed)
Visit Information  Thank you for taking time to visit with me today. Please don't hesitate to contact me if I can be of assistance to you before our next scheduled telephone appointment.  Following are the goals we discussed today:  - begin a notebook of services in my neighborhood or community - call 211 when I need some help - follow-up on any referrals for help I am given - think ahead to make sure my need does not become an emergency - make a list of family or friends that I can call  Our next appointment is by telephone on 07/10/22 at 10:30am  Please call the care guide team at [phone] if you need to cancel or reschedule your appointment.   If you are experiencing a Mental Health or [place] or need someone to talk to, please call the Suicide and Crisis Lifeline: 73   Following is a copy of your full plan of care:  Care Plan : General Social Work (Adult)  Updates made by [employer], Darla Lesches, LCSW since 06/19/2022 12:00 AM     Problem: [facility] AMB "PATIENT-SPECIFIC PROBLEM"   Note:   CARE PLAN ENTRY (see longitudinal plan of care for additional care plan information)  Current Barriers:  Patient with Anxiety and Depression in need of assistance with connection to community resources  Knowledge deficits and need for support, education and care coordination related to community resources support  Financial constraints related to decrease in income  Patients states that money is now being deducted from her social security due to a overpayment. Patient now in financial difficulty and having trouble taking care of her monthly financial needs. Patient also has history of depression and anxiety  Clinical Goal(s)  Over the next 90 days, patient will work with care management team member to address concerns related to financial strain  Interventions provided by LCSW:  Assessed patient's care coordination needs related to financial needs and discussed ongoing care management  follow up  Discussed options to manage and adjust finances including requesting assistance from family and friends, concentrating on essentials, utilizing transportation benefit and catching up on utility bill with monthly financial assistance received through [employer] Provided patient with information about food banks, transportation, Research scientist (life sciences) counseling and senior housing which will be mailed to her home Advised patient to contact her insurance company to arrange transport to her next MD appointment Collaborated with appropriate clinical care team members regarding patient needs Provided mental health counseling with regard to referral to her history of depression and anxiety, confirmed that patient is at baseline, however is agreeable to follow up. Referral in place for [facility], contact number provided for follow up on status of appointment Motivational Interviewing employed PHQ2/ PHQ9 completed Solution-Focused Strategies employed:  Active listening / Reflection utilized  Emotional Support Provided    Patient Self Care Activities & Deficits:  Patient is unable to independently navigate community resource options without care coordination support  Acknowledges deficits and is motivated to resolve concern  Patient is able to contact   as discussed today Unable to perform ADLs independently Ability for insight Independent living Motivation for treatment Strong family or social support  Initial goal documentation       Ms. Rozenberg was given information about Care Management services by the embedded care coordination team including:  Care Management services include personalized support from designated clinical staff supervised by her physician, including individualized plan of care and coordination with other care providers 24/7 contact phone  numbers for assistance for urgent and routine care needs. The patient may stop CCM services at any time  (effective at the end of the month) by phone call to the office staff.  Patient agreed to services and verbal consent obtained.   Patient verbalizes understanding of instructions and care plan provided today and agrees to view in [place]. Active MyChart status and patient understanding of how to access instructions and care plan via MyChart confirmed with patient.     Telephone follow up appointment with care management team member scheduled for: 07/10/22   Elliot Gurney, [employer] Worker  [facility] Care Management [phone]

## 2022-06-19 NOTE — Progress Notes (Signed)
Tellefsen, ALITZEL COOKSON ([id number]) Visit Report for 06/18/2022 Arrival Information Details Patient Name: Luera, [name] Date of Service: 06/18/2022 11:00 AM Medical Record Number: [id number] Patient Account Number: [id number] Date of Birth/Sex: 12/10/1950 (71 y.o. F) Treating RN: Carlene Coria Primary Care Raymel Cull: Lavon Paganini Other Clinician: Referring Sankalp Ferrell: Lavon Paganini Treating Cierria Height/Extender: Skipper Cliche in Treatment: 6 Visit Information History Since Last Visit All ordered tests and consults were completed: No Patient Arrived: Ambulatory Added or deleted any medications: No Arrival Time: 10:59 Any new allergies or adverse reactions: No Accompanied By: self Had a fall or experienced change in No Transfer Assistance: None activities of daily living that may affect Patient Identification Verified: Yes risk of falls: Secondary Verification Process Completed: Yes Signs or symptoms of abuse/neglect since last visito No Patient Requires Transmission-Based Precautions: No Hospitalized since last visit: No Patient Has Alerts: Yes Implantable device outside of the clinic excluding No Patient Alerts: DIABETIC cellular tissue based products placed in the center since last visit: Has Dressing in Place as Prescribed: Yes Pain Present Now: No Electronic Signature(s) Signed: 06/19/2022 8:33:55 AM By: Carlene Coria RN Entered By: Carlene Coria on 06/18/2022 11:05:08 Clutter, Alvine D. ([id number]) -------------------------------------------------------------------------------- Clinic Level of Care Assessment Details Patient Name: Hodge, Loura D. Date of Service: 06/18/2022 11:00 AM Medical Record Number: [id number] Patient Account Number: [id number] Date of Birth/Sex: Mar 10, 1951 (71 y.o. F) Treating RN: Carlene Coria Primary Care Shaylah Mcghie: Lavon Paganini Other Clinician: Referring Edom Schmuhl: Lavon Paganini Treating Amandy Chubbuck/Extender: Skipper Cliche in Treatment:  6 Clinic Level of Care Assessment Items TOOL 1 Quantity Score '[]'$  - Use when EandM and Procedure is performed on INITIAL visit 0 ASSESSMENTS - Nursing Assessment / Reassessment '[]'$  - General Physical Exam (combine w/ comprehensive assessment (listed just below) when performed on new 0 pt. evals) '[]'$  - 0 Comprehensive Assessment (HX, ROS, Risk Assessments, Wounds Hx, etc.) ASSESSMENTS - Wound and Skin Assessment / Reassessment '[]'$  - Dermatologic / Skin Assessment (not related to wound area) 0 ASSESSMENTS - Ostomy and/or Continence Assessment and Care '[]'$  - Incontinence Assessment and Management 0 '[]'$  - 0 Ostomy Care Assessment and Management (repouching, etc.) PROCESS - Coordination of Care '[]'$  - Simple Patient / Family Education for ongoing care 0 '[]'$  - 0 Complex (extensive) Patient / Family Education for ongoing care '[]'$  - 0 Staff obtains Programmer, systems, Records, Test Results / Process Orders '[]'$  - 0 Staff telephones HHA, Nursing Homes / Clarify orders / etc '[]'$  - 0 Routine Transfer to another Facility (non-emergent condition) '[]'$  - 0 Routine Hospital Admission (non-emergent condition) '[]'$  - 0 New Admissions / Biomedical engineer / Ordering NPWT, Apligraf, etc. '[]'$  - 0 Emergency Hospital Admission (emergent condition) PROCESS - Special Needs '[]'$  - Pediatric / Minor Patient Management 0 '[]'$  - 0 Isolation Patient Management '[]'$  - 0 Hearing / Language / Visual special needs '[]'$  - 0 Assessment of Community assistance (transportation, D/C planning, etc.) '[]'$  - 0 Additional assistance / Altered mentation '[]'$  - 0 Support Surface(s) Assessment (bed, cushion, seat, etc.) INTERVENTIONS - Miscellaneous '[]'$  - External ear exam 0 '[]'$  - 0 Patient Transfer (multiple staff / Civil Service fast streamer / Similar devices) '[]'$  - 0 Simple Staple / Suture removal (25 or less) '[]'$  - 0 Complex Staple / Suture removal (26 or more) '[]'$  - 0 Hypo/Hyperglycemic Management (do not check if billed separately) '[]'$  - 0 Ankle /  Brachial Index (ABI) - do not check if billed separately Has the patient been seen at the hospital within the last three years: Yes Total  Score: 0 Level Of Care: ____ GARNERSharlene, Mccluskey ([id number]) Electronic Signature(s) Signed: 06/19/2022 8:33:55 AM By: Carlene Coria RN Entered By: Carlene Coria on 06/18/2022 11:38:50 Savo, Lyna D. ([id number]) -------------------------------------------------------------------------------- Encounter Discharge Information Details Patient Name: Hannen, Manilla D. Date of Service: 06/18/2022 11:00 AM Medical Record Number: [id number] Patient Account Number: [id number] Date of Birth/Sex: [birth year] (71 y.o. F) Treating RN: Carlene Coria Primary Care Janeisha Ryle: Lavon Paganini Other Clinician: Referring Kiele Heavrin: Lavon Paganini Treating Briceson Broadwater/Extender: Skipper Cliche in Treatment: 6 Encounter Discharge Information Items Discharge Condition: Stable Ambulatory Status: Ambulatory Discharge Destination: Home Transportation: Private Auto Accompanied By: self Schedule Follow-up Appointment: Yes Clinical Summary of Care: Electronic Signature(s) Signed: 06/19/2022 8:33:55 AM By: Carlene Coria RN Entered By: Carlene Coria on 06/18/2022 11:39:40 Rauen, Floella D. ([id number]) -------------------------------------------------------------------------------- Lower Extremity Assessment Details Patient Name: Astorga, Charnel D. Date of Service: 06/18/2022 11:00 AM Medical Record Number: [id number] Patient Account Number: [id number] Date of Birth/Sex: Sep 26, 1951 (71 y.o. F) Treating RN: Carlene Coria Primary Care Troye Hiemstra: Lavon Paganini Other Clinician: Referring Joylyn Duggin: Lavon Paganini Treating Peace Jost/Extender: Skipper Cliche in Treatment: 6 Electronic Signature(s) Signed: 06/19/2022 8:33:55 AM By: Carlene Coria RN Entered By: Carlene Coria on 06/18/2022 11:11:51 Pelot, Aireanna D.  ([id number]) -------------------------------------------------------------------------------- Multi Wound Chart Details Patient Name: Leather, Jalexa D. Date of Service: 06/18/2022 11:00 AM Medical Record Number: [id number] Patient Account Number: [id number] Date of Birth/Sex: [birth year] (71 y.o. F) Treating RN: Carlene Coria Primary Care Arnesia Vincelette: Lavon Paganini Other Clinician: Referring Vernette Moise: Lavon Paganini Treating Dajae Kizer/Extender: Skipper Cliche in Treatment: 6 Vital Signs Height(in): 67 Pulse(bpm): 88 Weight(lbs): 230 Blood Pressure(mmHg): 152/74 Body Mass Index(BMI): 36 Temperature(F): 98 Respiratory Rate(breaths/min): 18 Photos: [N/A:N/A] Wound Location: Right Abdomen - Lower Quadrant N/A N/A Wounding Event: Gradually Appeared N/A N/A Primary Etiology: Abscess N/A N/A Comorbid History: Lymphedema, Congestive Heart N/A N/A Failure, Coronary Artery Disease, Hypertension, Type II Diabetes, Rheumatoid Arthritis, Osteoarthritis, Confinement Anxiety Date Acquired: 01/24/2022 N/A N/A Weeks of Treatment: 6 N/A N/A Wound Status: Open N/A N/A Wound Recurrence: No N/A N/A Measurements L x W x D (cm) 0.5x2.8x1.5 N/A N/A Area (cm) : 1.1 N/A N/A Volume (cm) : 1.649 N/A N/A % Reduction in Area: 6.60% N/A N/A % Reduction in Volume: 36.40% N/A N/A Classification: Full Thickness Without Exposed N/A N/A Support Structures Exudate Amount: Large N/A N/A Exudate Type: Serosanguineous N/A N/A Exudate Color: red, brown N/A N/A Granulation Amount: Large (67-100%) N/A N/A Granulation Quality: Red N/A N/A Necrotic Amount: None Present (0%) N/A N/A Exposed Structures: Fat Layer (Subcutaneous Tissue): N/A N/A Yes Fascia: No Tendon: No Muscle: No Joint: No Bone: No Epithelialization: None N/A N/A Treatment Notes Electronic Signature(s) Signed: 06/19/2022 8:33:55 AM By: Carlene Coria RN Entered By: Carlene Coria on 06/18/2022 11:12:01 Takahashi, Abbagale D. ([id number]) Paulo,  [place]. ([id number]) -------------------------------------------------------------------------------- [place] Details Patient Name: Yarbro, Nyelle D. Date of Service: 06/18/2022 11:00 AM Medical Record Number: [id number] Patient Account Number: [id number] Date of Birth/Sex: 19-Apr-1951 (71 y.o. F) Treating RN: Carlene Coria Primary Care Bernisha Verma: Lavon Paganini Other Clinician: Referring Igor Bishop: Lavon Paganini Treating Archer Vise/Extender: Skipper Cliche in Treatment: 6 Active Inactive Nutrition Nursing Diagnoses: Potential for alteratiion in Nutrition/Potential for imbalanced nutrition Goals: Patient/caregiver verbalizes understanding of need to maintain therapeutic glucose control per primary care physician Date Initiated: 05/07/2022 Target Resolution Date: 06/06/2022 Goal Status: Active Interventions: Assess HgA1c results as ordered upon admission and as needed Assess patient nutrition upon admission and as needed per policy Notes: Wound/Skin Impairment Nursing Diagnoses: Knowledge deficit related to ulceration/compromised skin  integrity Goals: Patient/caregiver will verbalize understanding of skin care regimen Date Initiated: 05/07/2022 Target Resolution Date: 06/06/2022 Goal Status: Active Ulcer/skin breakdown will have a volume reduction of 30% by week 4 Date Initiated: 05/07/2022 Target Resolution Date: 06/06/2022 Goal Status: Active Ulcer/skin breakdown will have a volume reduction of 50% by week 8 Date Initiated: 05/07/2022 Target Resolution Date: 07/07/2022 Goal Status: Active Ulcer/skin breakdown will have a volume reduction of 80% by week 12 Date Initiated: 05/07/2022 Target Resolution Date: 08/07/2022 Goal Status: Active Ulcer/skin breakdown will heal within 14 weeks Date Initiated: 05/07/2022 Target Resolution Date: 09/06/2022 Goal Status: Active Interventions: Assess patient/caregiver ability to obtain necessary supplies Assess patient/caregiver  ability to perform ulcer/skin care regimen upon admission and as needed Assess ulceration(s) every visit Notes: Electronic Signature(s) Signed: 06/19/2022 8:33:55 AM By: Carlene Coria RN Entered By: Carlene Coria on 06/18/2022 11:11:54 Wallick, Renalda D. ([id number]) -------------------------------------------------------------------------------- Pain Assessment Details Patient Name: Seif, Amany D. Date of Service: 06/18/2022 11:00 AM Medical Record Number: [id number] Patient Account Number: [id number] Date of Birth/Sex: [birth year] (71 y.o. F) Treating RN: Carlene Coria Primary Care Hadassa Cermak: Lavon Paganini Other Clinician: Referring Rodger Giangregorio: Lavon Paganini Treating Adyline Huberty/Extender: Skipper Cliche in Treatment: 6 Active Problems Location of Pain Severity and Description of Pain Patient Has Paino No Site Locations Pain Management and Medication Current Pain Management: Electronic Signature(s) Signed: 06/19/2022 8:33:55 AM By: Carlene Coria RN Entered By: Carlene Coria on 06/18/2022 11:05:31 Castrellon, Markell D. ([id number]) -------------------------------------------------------------------------------- Patient/Caregiver Education Details Patient Name: Casad, Ouita D. Date of Service: 06/18/2022 11:00 AM Medical Record Number: [id number] Patient Account Number: [id number] Date of Birth/Gender: 02/02/1951 (71 y.o. F) Treating RN: Carlene Coria Primary Care Physician: Lavon Paganini Other Clinician: Referring Physician: Lavon Paganini Treating Physician/Extender: Skipper Cliche in Treatment: 6 Education Assessment Education Provided To: Patient Education Topics Provided Wound/Skin Impairment: Methods: Explain/Verbal Responses: State content correctly Electronic Signature(s) Signed: 06/19/2022 8:33:55 AM By: Carlene Coria RN Entered By: Carlene Coria on 06/18/2022 11:39:11 Snider, Rever D.  ([id number]) -------------------------------------------------------------------------------- Wound Assessment Details Patient Name: Navarrette, Raelin D. Date of Service: 06/18/2022 11:00 AM Medical Record Number: [id number] Patient Account Number: [id number] Date of Birth/Sex: Aug 14, 1951 (71 y.o. F) Treating RN: Carlene Coria Primary Care Cobie Leidner: Lavon Paganini Other Clinician: Referring Cambelle Suchecki: Lavon Paganini Treating Lailah Marcelli/Extender: Skipper Cliche in Treatment: 6 Wound Status Wound Number: 9 Primary Abscess Etiology: Wound Location: Right Abdomen - Lower Quadrant Wound Open Wounding Event: Gradually Appeared Status: Date Acquired: 01/24/2022 Comorbid Lymphedema, Congestive Heart Failure, Coronary Artery Weeks Of Treatment: 6 History: Disease, Hypertension, Type II Diabetes, Rheumatoid Clustered Wound: No Arthritis, Osteoarthritis, Confinement Anxiety Photos Wound Measurements Length: (cm) 0.5 Width: (cm) 2.8 Depth: (cm) 1.5 Area: (cm) 1.1 Volume: (cm) 1.649 % Reduction in Area: 6.6% % Reduction in Volume: 36.4% Epithelialization: None Tunneling: No Undermining: No Wound Description Classification: Full Thickness Without Exposed Support Structu Exudate Amount: Large Exudate Type: Serosanguineous Exudate Color: red, brown res Foul Odor After Cleansing: No Slough/Fibrino No Wound Bed Granulation Amount: Large (67-100%) Exposed Structure Granulation Quality: Red Fascia Exposed: No Necrotic Amount: None Present (0%) Fat Layer (Subcutaneous Tissue) Exposed: Yes Tendon Exposed: No Muscle Exposed: No Joint Exposed: No Bone Exposed: No Treatment Notes Wound #9 (Abdomen - Lower Quadrant) Wound Laterality: Right Cleanser Peri-Wound Care Topical Nystop Nystatin Powder, 15 (g) bottle Harriger, Teri D. ([id number]) Primary Dressing Hydrofera Blue Ready Transfer Foam, 4x5 (in/in) Discharge Instruction: PACK LIGHTLY Secondary Dressing ABD Pad 5x9  (in/in) Discharge Instruction: Cover with ABD pad Secured With Medipore Tape - 67M [place]  Soft Cloth Surgical Tape, 2x2 (in/yd) Compression Wrap Compression Stockings Add-Ons Electronic Signature(s) Signed: 06/19/2022 8:33:55 AM By: Carlene Coria RN Entered By: Carlene Coria on 06/18/2022 11:11:32 Klabunde, Juley D. ([id number]) -------------------------------------------------------------------------------- Vitals Details Patient Name: Ashurst, Lihanna D. Date of Service: 06/18/2022 11:00 AM Medical Record Number: [id number] Patient Account Number: [id number] Date of Birth/Sex: 1951-06-04 (71 y.o. F) Treating RN: Carlene Coria Primary Care Lewanna Petrak: Lavon Paganini Other Clinician: Referring Ziyad Dyar: Lavon Paganini Treating Merrel Crabbe/Extender: Skipper Cliche in Treatment: 6 Vital Signs Time Taken: 11:05 Temperature (F): 98 Height (in): 67 Pulse (bpm): 88 Weight (lbs): 230 Respiratory Rate (breaths/min): 18 Body Mass Index (BMI): 36 Blood Pressure (mmHg): 152/74 Reference Range: 80 - 120 mg / dl Electronic Signature(s) Signed: 06/19/2022 8:33:55 AM By: Carlene Coria RN Entered By: Carlene Coria on 06/18/2022 11:05:26

## 2022-06-19 NOTE — Chronic Care Management (AMB) (Signed)
Chronic Care Management    Clinical Social Work Note  06/19/2022 Name: [name] [name] MRN: [id number] DOB: Apr 15, 1951  [name] [name] is a 71 y.o. year old female who is a primary care patient of Bacigalupo, Dionne Bucy, MD. The CCM team was consulted to assist the patient with chronic disease management and/or care coordination needs related to: [employer]  and [place] and Resources.   Engaged with patient by telephone for initial visit in response to provider referral for social work chronic care management and care coordination services.   Consent to Services:  The patient was given the following information about Chronic Care Management services today, agreed to services, and gave verbal consent: 1. CCM service includes personalized support from designated clinical staff supervised by the primary care provider, including individualized plan of care and coordination with other care providers 2. 24/7 contact phone numbers for assistance for urgent and routine care needs. 3. Service will only be billed when office clinical staff spend 20 minutes or more in a month to coordinate care. 4. Only one practitioner may furnish and bill the service in a calendar month. 5.The patient may stop CCM services at any time (effective at the end of the month) by phone call to the office staff. 6. The patient will be responsible for cost sharing (co-pay) of up to 20% of the service fee (after annual deductible is met). Patient agreed to services and consent obtained.  Patient agreed to services and consent obtained.   Assessment: Review of patient past medical history, allergies, medications, and health status, including review of relevant consultants reports was performed today as part of a comprehensive evaluation and provision of chronic care management and care coordination services.     SDOH (Social Determinants of Health) assessments and interventions performed:    Advanced Directives  Status: Not addressed in this encounter.  CCM Care Plan  Allergies  Allergen Reactions   Sulfa Antibiotics Itching, Rash and Swelling   Latex Itching    Other reaction(s): angioedema   Doxycycline Rash   Empagliflozin Itching and Other (See Comments)    Itching and frequent yeast infections    Outpatient Encounter Medications as of 06/19/2022  Medication Sig Note   Biotin w/ Vitamins C & E (HAIR/SKIN/NAILS PO) Take 1 tablet by mouth daily.    buPROPion 450 MG TB24 Take 450 mg by mouth daily.    carvedilol (COREG) 6.25 MG tablet TAKE ONE TABLET BY MOUTH TWICE DAILY    DULoxetine (CYMBALTA) 60 MG capsule NEW PRESCRIPTION REQUEST: TAKE ONE CAPSULE BY MOUTH DAILY    ezetimibe (ZETIA) 10 MG tablet TAKE 1 TABLET BY MOUTH EVERY DAY (Patient taking differently: Take 10 mg by mouth daily.)    GEMTESA 75 MG TABS Take 1 tablet by mouth daily.    losartan (COZAAR) 25 MG tablet TAKE 1 TABLET (25 MG TOTAL) BY MOUTH DAILY.    meloxicam (MOBIC) 15 MG tablet TAKE 1 TABLET (15 MG TOTAL) BY MOUTH DAILY. (Patient not taking: Reported on 05/31/2022)    metFORMIN (GLUCOPHAGE) 500 MG tablet NEW PRESCRIPTION REQUEST: TAKE ONE TABLET BY MOUTH TWICE DAILY    polyethylene glycol (MIRALAX / GLYCOLAX) 17 g packet Take 17 g by mouth daily as needed for mild constipation.    pregabalin (LYRICA) 75 MG capsule TAKE ONE CAPSULE BY MOUTH THREE TIMES DAILY    tolterodine (DETROL LA) 4 MG 24 hr capsule Take 4 mg by mouth daily. (Patient not taking: Reported on 05/31/2022) 03/14/2022: Pt  states this replaced Gemtasa, per [facility] last fill date 08/07/2021 #90 for 90 day supply   No facility-administered encounter medications on file as of 06/19/2022.    Patient Active Problem List   Diagnosis Date Noted   Passive suicidal ideations 05/31/2022   Financial insecurity 05/31/2022   Wound infection 03/13/2022   Normocytic anemia 03/03/2022   Abdominal wall cellulitis 03/03/2022   Wound dehiscence 03/03/2022    Stricture esophagus 02/21/2022   Hyponatremia 02/15/2022   Hypoalbuminemia 02/15/2022   Post op infection 02/15/2022    Class: Question of   Anemia associated with acute blood loss 02/15/2022   Hypokalemia 02/15/2022   Right arm pain 10/19/2021   Housing instability 03/28/2021   Dermatochalasis of both upper eyelids 03/23/2021   Exposure keratopathy, 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   Ptosis of both eyebrows 03/23/2021   Ptosis of both upper eyelids 03/23/2021   Senile ectropion of both lower eyelids 03/23/2021   Senile purpura ([place]) 03/07/2021   Panniculitis 02/03/2021   Myalgia due to statin 12/26/2020   Telogen effluvium 12/26/2020   Candidal intertrigo 12/06/2020   Microcytic anemia 12/06/2020   Polyneuropathy associated with underlying disease ([place]) 02/03/2020   Lump in chest 12/28/2019   Mixed stress and urge urinary incontinence 08/21/2019   Avitaminosis D 07/15/2019   Drug-induced constipation 06/12/2019   Lumbar spondylosis 07/08/2018   Chronic bilateral low back pain with bilateral sciatica 07/08/2018   Chronic pain syndrome 07/08/2018   Urinary incontinence 03/19/2018   Complex endometrial hyperplasia with atypia 09/03/2017   Atypical endometrial hyperplasia 08/15/2017   Bilateral shoulder pain 07/18/2017   Lymphedema 01/31/2017   Hyperlipidemia associated with type 2 diabetes mellitus ([place]) 12/14/2016   Hypertension associated with diabetes ([place]) 11/73/5670   Chronic systolic CHF (congestive heart failure), NYHA class 3 ([place]) 10/06/2015   History of MI (myocardial infarction) 09/20/2015   Allergic rhinitis 07/06/2015   Chronic venous insufficiency 07/06/2015   Atherosclerosis of coronary artery 07/06/2015   MDD (major depressive disorder) 07/06/2015   Fibromyalgia 07/06/2015   Rheumatoid arthritis involving multiple sites with positive rheumatoid factor ([place]) 07/06/2015   Type 2 diabetes mellitus with complication, without long-term current use of insulin ([place]) 04/08/2014    Migraines 04/08/2014   Gastroduodenal ulcer 04/08/2014   Venous stasis 04/08/2014   Osteoarthritis 04/08/2014   Coronary artery disease 04/08/2014   Peptic ulcer disease 04/08/2014   Obesity 04/08/2014   Seropositive rheumatoid arthritis ([place]) 04/06/2014    Conditions to be addressed/monitored: Anxiety and Depression; Financial constraints related to decrease in income and Mental Health Concerns   Care Plan : General Social Work (Adult)  Updates made by [employer], Darla Lesches, LCSW since 06/19/2022 12:00 AM     Problem: CHL AMB "PATIENT-SPECIFIC PROBLEM"   Note:   CARE PLAN ENTRY (see longitudinal plan of care for additional care plan information)  Current Barriers:  Patient with Anxiety and Depression in need of assistance with connection to community resources  Knowledge deficits and need for support, education and care coordination related to community resources support  Financial constraints related to decrease in income  Patients states that money is now being deducted from her social security due to a overpayment. Patient now in financial difficulty and having trouble taking care of her monthly financial needs. Patient also has history of depression and anxiety  Clinical Goal(s)  Over the next 90 days, patient will work with care management team member to address concerns related to financial strain  Interventions provided by LCSW:  Assessed patient's care  coordination needs related to financial needs and discussed ongoing care management follow up  Discussed options to manage and adjust finances including requesting assistance from family and friends, concentrating on essentials, utilizing transportation benefit and catching up on utility bill with monthly financial assistance received through [employer] Provided patient with information about food banks, transportation, Research scientist (life sciences) counseling and senior housing which will be mailed to her home Advised patient to  contact her insurance company to arrange transport to her next MD appointment Collaborated with appropriate clinical care team members regarding patient needs Provided mental health counseling with regard to referral to her history of depression and anxiety, confirmed that patient is at baseline, however is agreeable to follow up. Referral in place for [facility], contact number provided for follow up on status of appointment Motivational Interviewing employed PHQ2/ PHQ9 completed Solution-Focused Strategies employed:  Active listening / Reflection utilized  Emotional Support Provided    Patient Self Care Activities & Deficits:  Patient is unable to independently navigate community resource options without care coordination support  Acknowledges deficits and is motivated to resolve concern  Patient is able to contact   as discussed today Unable to perform ADLs independently Ability for insight Independent living Motivation for treatment Strong family or social support  Initial goal documentation        Follow Up Plan: SW will follow up with patient by phone over the next 30 business days       [place], [place] Worker  [facility] [phone]

## 2022-06-20 DIAGNOSIS — I2510 Atherosclerotic heart disease of native coronary artery without angina pectoris: Secondary | ICD-10-CM | POA: Diagnosis not present

## 2022-06-20 DIAGNOSIS — M545 Low back pain, unspecified: Secondary | ICD-10-CM | POA: Diagnosis not present

## 2022-06-20 DIAGNOSIS — E119 Type 2 diabetes mellitus without complications: Secondary | ICD-10-CM | POA: Diagnosis not present

## 2022-06-20 DIAGNOSIS — M797 Fibromyalgia: Secondary | ICD-10-CM | POA: Diagnosis not present

## 2022-06-20 DIAGNOSIS — G8929 Other chronic pain: Secondary | ICD-10-CM | POA: Diagnosis not present

## 2022-06-20 DIAGNOSIS — Z7984 Long term (current) use of oral hypoglycemic drugs: Secondary | ICD-10-CM | POA: Diagnosis not present

## 2022-06-20 DIAGNOSIS — Z9181 History of falling: Secondary | ICD-10-CM | POA: Diagnosis not present

## 2022-06-20 DIAGNOSIS — M1990 Unspecified osteoarthritis, unspecified site: Secondary | ICD-10-CM | POA: Diagnosis not present

## 2022-06-20 DIAGNOSIS — F32A Depression, unspecified: Secondary | ICD-10-CM | POA: Diagnosis not present

## 2022-06-20 DIAGNOSIS — M069 Rheumatoid arthritis, unspecified: Secondary | ICD-10-CM | POA: Diagnosis not present

## 2022-06-20 DIAGNOSIS — I110 Hypertensive heart disease with heart failure: Secondary | ICD-10-CM | POA: Diagnosis not present

## 2022-06-20 DIAGNOSIS — Z96652 Presence of left artificial knee joint: Secondary | ICD-10-CM | POA: Diagnosis not present

## 2022-06-20 DIAGNOSIS — I509 Heart failure, unspecified: Secondary | ICD-10-CM | POA: Diagnosis not present

## 2022-06-20 DIAGNOSIS — T8149XD Infection following a procedure, other surgical site, subsequent encounter: Secondary | ICD-10-CM | POA: Diagnosis not present

## 2022-06-20 DIAGNOSIS — E785 Hyperlipidemia, unspecified: Secondary | ICD-10-CM | POA: Diagnosis not present

## 2022-06-20 DIAGNOSIS — M48061 Spinal stenosis, lumbar region without neurogenic claudication: Secondary | ICD-10-CM | POA: Diagnosis not present

## 2022-06-20 DIAGNOSIS — Z87891 Personal history of nicotine dependence: Secondary | ICD-10-CM | POA: Diagnosis not present

## 2022-06-21 ENCOUNTER — Other Ambulatory Visit: Payer: Medicare Other

## 2022-06-22 DIAGNOSIS — M48061 Spinal stenosis, lumbar region without neurogenic claudication: Secondary | ICD-10-CM | POA: Diagnosis not present

## 2022-06-22 DIAGNOSIS — Z96652 Presence of left artificial knee joint: Secondary | ICD-10-CM | POA: Diagnosis not present

## 2022-06-22 DIAGNOSIS — M069 Rheumatoid arthritis, unspecified: Secondary | ICD-10-CM | POA: Diagnosis not present

## 2022-06-22 DIAGNOSIS — G8929 Other chronic pain: Secondary | ICD-10-CM | POA: Diagnosis not present

## 2022-06-22 DIAGNOSIS — Z9181 History of falling: Secondary | ICD-10-CM | POA: Diagnosis not present

## 2022-06-22 DIAGNOSIS — E785 Hyperlipidemia, unspecified: Secondary | ICD-10-CM | POA: Diagnosis not present

## 2022-06-22 DIAGNOSIS — I2510 Atherosclerotic heart disease of native coronary artery without angina pectoris: Secondary | ICD-10-CM | POA: Diagnosis not present

## 2022-06-22 DIAGNOSIS — M545 Low back pain, unspecified: Secondary | ICD-10-CM | POA: Diagnosis not present

## 2022-06-22 DIAGNOSIS — E119 Type 2 diabetes mellitus without complications: Secondary | ICD-10-CM | POA: Diagnosis not present

## 2022-06-22 DIAGNOSIS — T8149XD Infection following a procedure, other surgical site, subsequent encounter: Secondary | ICD-10-CM | POA: Diagnosis not present

## 2022-06-22 DIAGNOSIS — F32A Depression, unspecified: Secondary | ICD-10-CM | POA: Diagnosis not present

## 2022-06-22 DIAGNOSIS — M1990 Unspecified osteoarthritis, unspecified site: Secondary | ICD-10-CM | POA: Diagnosis not present

## 2022-06-22 DIAGNOSIS — I110 Hypertensive heart disease with heart failure: Secondary | ICD-10-CM | POA: Diagnosis not present

## 2022-06-22 DIAGNOSIS — M797 Fibromyalgia: Secondary | ICD-10-CM | POA: Diagnosis not present

## 2022-06-22 DIAGNOSIS — I509 Heart failure, unspecified: Secondary | ICD-10-CM | POA: Diagnosis not present

## 2022-06-22 DIAGNOSIS — Z87891 Personal history of nicotine dependence: Secondary | ICD-10-CM | POA: Diagnosis not present

## 2022-06-22 DIAGNOSIS — Z7984 Long term (current) use of oral hypoglycemic drugs: Secondary | ICD-10-CM | POA: Diagnosis not present

## 2022-06-25 ENCOUNTER — Encounter: Payer: Medicare Other | Admitting: Internal Medicine

## 2022-06-25 ENCOUNTER — Telehealth: Payer: Self-pay | Admitting: Family Medicine

## 2022-06-25 ENCOUNTER — Telehealth: Payer: Self-pay

## 2022-06-25 DIAGNOSIS — E11622 Type 2 diabetes mellitus with other skin ulcer: Secondary | ICD-10-CM | POA: Diagnosis not present

## 2022-06-25 DIAGNOSIS — I5042 Chronic combined systolic (congestive) and diastolic (congestive) heart failure: Secondary | ICD-10-CM | POA: Diagnosis not present

## 2022-06-25 DIAGNOSIS — I110 Hypertensive heart disease with heart failure: Secondary | ICD-10-CM | POA: Diagnosis not present

## 2022-06-25 DIAGNOSIS — M069 Rheumatoid arthritis, unspecified: Secondary | ICD-10-CM | POA: Diagnosis not present

## 2022-06-25 DIAGNOSIS — L98492 Non-pressure chronic ulcer of skin of other sites with fat layer exposed: Secondary | ICD-10-CM | POA: Diagnosis not present

## 2022-06-25 DIAGNOSIS — E1151 Type 2 diabetes mellitus with diabetic peripheral angiopathy without gangrene: Secondary | ICD-10-CM | POA: Diagnosis not present

## 2022-06-25 DIAGNOSIS — I890 Lymphedema, not elsewhere classified: Secondary | ICD-10-CM | POA: Diagnosis not present

## 2022-06-25 DIAGNOSIS — L02211 Cutaneous abscess of abdominal wall: Secondary | ICD-10-CM | POA: Diagnosis not present

## 2022-06-25 NOTE — Telephone Encounter (Signed)
Copied from [place]. Topic: General - Other >> Jun 25, 2022  1:09 PM Ja-Kwan M wrote: Reason for CRM: Caryl Pina with [facility] reports that pt may have diabetes but is not on a statin. Per current ADA guidelines ACC/AHA cholesterol guideline recommendations. Please ask doctor to consider and re-evaluate to prescribe statin. If approved please send to pt pharmacy. Cb# [phone]

## 2022-06-25 NOTE — Telephone Encounter (Signed)
Home Health Verbal Orders - Caller/Agency: [facility] Number: [phone] Requesting OT/PT/Skilled Nursing/Social Work/Speech Therapy: Skilled nursing, wound care, medication management Frequency: 2 X WK for 9 WKS

## 2022-06-25 NOTE — Telephone Encounter (Signed)
Please Review

## 2022-06-26 NOTE — Progress Notes (Signed)
Weis, YEE GANGI ([id number]) Visit Report for 06/25/2022 Arrival Information Details Patient Name: Lampert, Halynn D. Date of Service: 06/25/2022 11:00 AM Medical Record Number: [id number] Patient Account Number: [id number] Date of Birth/Sex: Apr 28, 1951 (71 y.o. F) Treating RN: Carlene Coria Primary Care Deadra Diggins: Lavon Paganini Other Clinician: Referring Guida Asman: Lavon Paganini Treating Pietra Zuluaga/Extender: Tito Dine in Treatment: 7 Visit Information History Since Last Visit All ordered tests and consults were completed: No Patient Arrived: Ambulatory Added or deleted any medications: No Arrival Time: 11:00 Any new allergies or adverse reactions: No Accompanied By: self Had a fall or experienced change in No Transfer Assistance: None activities of daily living that may affect Patient Identification Verified: Yes risk of falls: Secondary Verification Process Completed: Yes Signs or symptoms of abuse/neglect since last visito No Patient Requires Transmission-Based Precautions: No Hospitalized since last visit: No Patient Has Alerts: Yes Implantable device outside of the clinic excluding No Patient Alerts: DIABETIC cellular tissue based products placed in the center since last visit: Has Dressing in Place as Prescribed: Yes Pain Present Now: No Electronic Signature(s) Signed: 06/26/2022 3:54:37 PM By: Carlene Coria RN Entered By: Carlene Coria on 06/25/2022 11:04:09 Weisse, Abena D. ([id number]) -------------------------------------------------------------------------------- Clinic Level of Care Assessment Details Patient Name: Kessenich, Mariposa D. Date of Service: 06/25/2022 11:00 AM Medical Record Number: [id number] Patient Account Number: [id number] Date of Birth/Sex: 03-03-1951 (71 y.o. F) Treating RN: Carlene Coria Primary Care Kahlan Engebretson: Lavon Paganini Other Clinician: Referring Korri Ask: Lavon Paganini Treating Batina Dougan/Extender: Tito Dine in  Treatment: 7 Clinic Level of Care Assessment Items TOOL 4 Quantity Score X - Use when only an EandM is performed on FOLLOW-UP visit 1 0 ASSESSMENTS - Nursing Assessment / Reassessment X - Reassessment of Co-morbidities (includes updates in patient status) 1 10 '[]'$  - 0 Reassessment of Adherence to Treatment Plan ASSESSMENTS - Wound and Skin Assessment / Reassessment X - Simple Wound Assessment / Reassessment - one wound 1 5 '[]'$  - 0 Complex Wound Assessment / Reassessment - multiple wounds '[]'$  - 0 Dermatologic / Skin Assessment (not related to wound area) ASSESSMENTS - Focused Assessment '[]'$  - Circumferential Edema Measurements - multi extremities 0 '[]'$  - 0 Nutritional Assessment / Counseling / Intervention '[]'$  - 0 Lower Extremity Assessment (monofilament, tuning fork, pulses) '[]'$  - 0 Peripheral Arterial Disease Assessment (using hand held doppler) ASSESSMENTS - Ostomy and/or Continence Assessment and Care '[]'$  - Incontinence Assessment and Management 0 '[]'$  - 0 Ostomy Care Assessment and Management (repouching, etc.) PROCESS - Coordination of Care X - Simple Patient / Family Education for ongoing care 1 15 '[]'$  - 0 Complex (extensive) Patient / Family Education for ongoing care '[]'$  - 0 Staff obtains Programmer, systems, Records, Test Results / Process Orders '[]'$  - 0 Staff telephones HHA, Nursing Homes / Clarify orders / etc '[]'$  - 0 Routine Transfer to another Facility (non-emergent condition) '[]'$  - 0 Routine Hospital Admission (non-emergent condition) '[]'$  - 0 New Admissions / Biomedical engineer / Ordering NPWT, Apligraf, etc. '[]'$  - 0 Emergency Hospital Admission (emergent condition) X- 1 10 Simple Discharge Coordination '[]'$  - 0 Complex (extensive) Discharge Coordination PROCESS - Special Needs '[]'$  - Pediatric / Minor Patient Management 0 '[]'$  - 0 Isolation Patient Management '[]'$  - 0 Hearing / Language / Visual special needs '[]'$  - 0 Assessment of Community assistance (transportation, D/C  planning, etc.) '[]'$  - 0 Additional assistance / Altered mentation '[]'$  - 0 Support Surface(s) Assessment (bed, cushion, seat, etc.) INTERVENTIONS - Wound Cleansing / Measurement Crothers, Perline D. ([id number]) X- 1  5 Simple Wound Cleansing - one wound '[]'$  - 0 Complex Wound Cleansing - multiple wounds X- 1 5 Wound Imaging (photographs - any number of wounds) '[]'$  - 0 Wound Tracing (instead of photographs) X- 1 5 Simple Wound Measurement - one wound '[]'$  - 0 Complex Wound Measurement - multiple wounds INTERVENTIONS - Wound Dressings X - Small Wound Dressing one or multiple wounds 1 10 '[]'$  - 0 Medium Wound Dressing one or multiple wounds '[]'$  - 0 Large Wound Dressing one or multiple wounds X- 1 5 Application of Medications - topical '[]'$  - 0 Application of Medications - injection INTERVENTIONS - Miscellaneous '[]'$  - External ear exam 0 '[]'$  - 0 Specimen Collection (cultures, biopsies, blood, body fluids, etc.) '[]'$  - 0 Specimen(s) / Culture(s) sent or taken to Lab for analysis '[]'$  - 0 Patient Transfer (multiple staff / Civil Service fast streamer / Similar devices) '[]'$  - 0 Simple Staple / Suture removal (25 or less) '[]'$  - 0 Complex Staple / Suture removal (26 or more) '[]'$  - 0 Hypo / Hyperglycemic Management (close monitor of Blood Glucose) '[]'$  - 0 Ankle / Brachial Index (ABI) - do not check if billed separately X- 1 5 Vital Signs Has the patient been seen at the hospital within the last three years: Yes Total Score: 75 Level Of Care: New/Established - Level 2 Electronic Signature(s) Signed: 06/26/2022 3:54:37 PM By: Carlene Coria RN Entered By: Carlene Coria on 06/25/2022 16:24:46 Thum, Annjeanette D. ([id number]) -------------------------------------------------------------------------------- Encounter Discharge Information Details Patient Name: Love, Telisha D. Date of Service: 06/25/2022 11:00 AM Medical Record Number: [id number] Patient Account Number: [id number] Date of Birth/Sex: [birth year] (71 y.o.  F) Treating RN: Carlene Coria Primary Care Cole Eastridge: Lavon Paganini Other Clinician: Referring Kiaria Quinnell: Lavon Paganini Treating Yari Szeliga/Extender: Tito Dine in Treatment: 7 Encounter Discharge Information Items Discharge Condition: Stable Ambulatory Status: Wheelchair Discharge Destination: Home Transportation: Private Auto Accompanied By: caregiver Schedule Follow-up Appointment: Yes Clinical Summary of Care: Electronic Signature(s) Signed: 06/25/2022 4:25:48 PM By: Carlene Coria RN Entered By: Carlene Coria on 06/25/2022 16:25:48 Rivard, Murrel D. ([id number]) -------------------------------------------------------------------------------- Lower Extremity Assessment Details Patient Name: Pothier, Ameira D. Date of Service: 06/25/2022 11:00 AM Medical Record Number: [id number] Patient Account Number: [id number] Date of Birth/Sex: 04-15-1951 (71 y.o. F) Treating RN: Carlene Coria Primary Care Andreia Gandolfi: Lavon Paganini Other Clinician: Referring Nyx Keady: Lavon Paganini Treating Theodosia Bahena/Extender: Tito Dine in Treatment: 7 Electronic Signature(s) Signed: 06/26/2022 3:54:37 PM By: Carlene Coria RN Entered By: Carlene Coria on 06/25/2022 11:09:40 Bauer, Laisha D. ([id number]) -------------------------------------------------------------------------------- Multi Wound Chart Details Patient Name: Carbo, Latonja D. Date of Service: 06/25/2022 11:00 AM Medical Record Number: [id number] Patient Account Number: [id number] Date of Birth/Sex: 19-Mar-1951 (71 y.o. F) Treating RN: Carlene Coria Primary Care Leelynn Whetsel: Lavon Paganini Other Clinician: Referring Sharran Caratachea: Lavon Paganini Treating Chasmine Lender/Extender: Tito Dine in Treatment: 7 Vital Signs Height(in): 67 Pulse(bpm): 87 Weight(lbs): 230 Blood Pressure(mmHg): 134/72 Body Mass Index(BMI): 36 Temperature(F): 97.8 Respiratory Rate(breaths/min): 16 Photos: [N/A:N/A] Wound Location:  Right Abdomen - Lower Quadrant N/A N/A Wounding Event: Gradually Appeared N/A N/A Primary Etiology: Abscess N/A N/A Comorbid History: Lymphedema, Congestive Heart N/A N/A Failure, Coronary Artery Disease, Hypertension, Type II Diabetes, Rheumatoid Arthritis, Osteoarthritis, Confinement Anxiety Date Acquired: 01/24/2022 N/A N/A Weeks of Treatment: 7 N/A N/A Wound Status: Open N/A N/A Wound Recurrence: No N/A N/A Measurements L x W x D (cm) 0.5x2.5x1.3 N/A N/A Area (cm) : 0.982 N/A N/A Volume (cm) : 1.276 N/A N/A % Reduction in Area: 16.60% N/A N/A % Reduction in  Volume: 50.80% N/A N/A Classification: Full Thickness Without Exposed N/A N/A Support Structures Exudate Amount: Medium N/A N/A Exudate Type: Serosanguineous N/A N/A Exudate Color: red, brown N/A N/A Granulation Amount: Large (67-100%) N/A N/A Granulation Quality: Red N/A N/A Necrotic Amount: None Present (0%) N/A N/A Exposed Structures: Fat Layer (Subcutaneous Tissue): N/A N/A Yes Fascia: No Tendon: No Muscle: No Joint: No Bone: No Epithelialization: None N/A N/A Treatment Notes Electronic Signature(s) Signed: 06/26/2022 3:54:37 PM By: Carlene Coria RN Entered By: Carlene Coria on 06/25/2022 11:10:27 Chance, Yarely D. ([id number]) Orantes, [place]. ([id number]) -------------------------------------------------------------------------------- [place] Details Patient Name: Caine, Shereese D. Date of Service: 06/25/2022 11:00 AM Medical Record Number: [id number] Patient Account Number: [id number] Date of Birth/Sex: 1951-01-04 (71 y.o. F) Treating RN: Carlene Coria Primary Care Omeka Holben: Lavon Paganini Other Clinician: Referring Chiquetta Langner: Lavon Paganini Treating Tashan Kreitzer/Extender: Tito Dine in Treatment: 7 Active Inactive Wound/Skin Impairment Nursing Diagnoses: Knowledge deficit related to ulceration/compromised skin integrity Goals: Patient/caregiver will verbalize  understanding of skin care regimen Date Initiated: 05/07/2022 Target Resolution Date: 07/07/2022 Goal Status: Active Ulcer/skin breakdown will have a volume reduction of 30% by week 4 Date Initiated: 05/07/2022 Date Inactivated: 06/25/2022 Target Resolution Date: 06/06/2022 Goal Status: Unmet Unmet Reason: comorbities Ulcer/skin breakdown will have a volume reduction of 50% by week 8 Date Initiated: 05/07/2022 Target Resolution Date: 07/07/2022 Goal Status: Active Ulcer/skin breakdown will have a volume reduction of 80% by week 12 Date Initiated: 05/07/2022 Target Resolution Date: 08/07/2022 Goal Status: Active Ulcer/skin breakdown will heal within 14 weeks Date Initiated: 05/07/2022 Target Resolution Date: 09/06/2022 Goal Status: Active Interventions: Assess patient/caregiver ability to obtain necessary supplies Assess patient/caregiver ability to perform ulcer/skin care regimen upon admission and as needed Assess ulceration(s) every visit Notes: Electronic Signature(s) Signed: 06/26/2022 3:54:37 PM By: Carlene Coria RN Entered By: Carlene Coria on 06/25/2022 11:10:19 Brazell, Talise D. ([id number]) -------------------------------------------------------------------------------- Pain Assessment Details Patient Name: Alfieri, Consuella D. Date of Service: 06/25/2022 11:00 AM Medical Record Number: [id number] Patient Account Number: [id number] Date of Birth/Sex: [birth year] (71 y.o. F) Treating RN: Carlene Coria Primary Care Robert Sunga: Lavon Paganini Other Clinician: Referring Eulas Schweitzer: Lavon Paganini Treating Arrick Dutton/Extender: Tito Dine in Treatment: 7 Active Problems Location of Pain Severity and Description of Pain Patient Has Paino No Site Locations Pain Management and Medication Current Pain Management: Electronic Signature(s) Signed: 06/26/2022 3:54:37 PM By: Carlene Coria RN Entered By: Carlene Coria on 06/25/2022 11:05:03 [place], Leo D.  ([id number]) -------------------------------------------------------------------------------- Patient/Caregiver Education Details Patient Name: Lozada, Luree D. Date of Service: 06/25/2022 11:00 AM Medical Record Number: [id number] Patient Account Number: [id number] Date of Birth/Gender: 11-08-1951 (71 y.o. F) Treating RN: Carlene Coria Primary Care Physician: Lavon Paganini Other Clinician: Referring Physician: Lavon Paganini Treating Physician/Extender: Tito Dine in Treatment: 7 Education Assessment Education Provided To: Patient Education Topics Provided Wound/Skin Impairment: Methods: Explain/Verbal Responses: State content correctly Electronic Signature(s) Signed: 06/26/2022 3:54:37 PM By: Carlene Coria RN Entered By: Carlene Coria on 06/25/2022 16:25:10 Tkach, Jerilyn D. ([id number]) -------------------------------------------------------------------------------- Wound Assessment Details Patient Name: Lanagan, Abrie D. Date of Service: 06/25/2022 11:00 AM Medical Record Number: [id number] Patient Account Number: [id number] Date of Birth/Sex: 08/06/1951 (71 y.o. F) Treating RN: Carlene Coria Primary Care Pratik Dalziel: Lavon Paganini Other Clinician: Referring Hussien Greenblatt: Lavon Paganini Treating Fuquan Wilson/Extender: Tito Dine in Treatment: 7 Wound Status Wound Number: 9 Primary Abscess Etiology: Wound Location: Right Abdomen - Lower Quadrant Wound Open Wounding Event: Gradually Appeared Status: Date Acquired: 01/24/2022 Comorbid Lymphedema, Congestive Heart Failure, Coronary Artery Weeks Of  Treatment: 7 History: Disease, Hypertension, Type II Diabetes, Rheumatoid Clustered Wound: No Arthritis, Osteoarthritis, Confinement Anxiety Photos Wound Measurements Length: (cm) 0.5 Width: (cm) 2.5 Depth: (cm) 1.3 Area: (cm) 0.982 Volume: (cm) 1.276 % Reduction in Area: 16.6% % Reduction in Volume: 50.8% Epithelialization: None Tunneling:  No Undermining: No Wound Description Classification: Full Thickness Without Exposed Support Structu Exudate Amount: Medium Exudate Type: Serosanguineous Exudate Color: red, brown res Foul Odor After Cleansing: No Slough/Fibrino No Wound Bed Granulation Amount: Large (67-100%) Exposed Structure Granulation Quality: Red Fascia Exposed: No Necrotic Amount: None Present (0%) Fat Layer (Subcutaneous Tissue) Exposed: Yes Tendon Exposed: No Muscle Exposed: No Joint Exposed: No Bone Exposed: No Treatment Notes Wound #9 (Abdomen - Lower Quadrant) Wound Laterality: Right Cleanser Peri-Wound Care Topical Nystop Nystatin Powder, 15 (g) bottle Pardue, Kylene D. ([id number]) Primary Dressing Hydrofera Blue Ready Transfer Foam, 4x5 (in/in) Discharge Instruction: PACK LIGHTLY Secondary Dressing Gauze Discharge Instruction: As directed: dry, moistened with saline or moistened with Dakins Solution Secured With Medipore Tape - 69M Medipore H Soft Cloth Surgical Tape, 2x2 (in/yd) Compression Wrap Compression Stockings Add-Ons Electronic Signature(s) Signed: 06/26/2022 3:54:37 PM By: Carlene Coria RN Entered By: Carlene Coria on 06/25/2022 11:09:27 Vancuren, Gregg D. ([id number]) -------------------------------------------------------------------------------- Vitals Details Patient Name: Stiehl, Veroncia D. Date of Service: 06/25/2022 11:00 AM Medical Record Number: [id number] Patient Account Number: [id number] Date of Birth/Sex: December 31, 1950 (71 y.o. F) Treating RN: Carlene Coria Primary Care Analise Glotfelty: Lavon Paganini Other Clinician: Referring Ariannah Arenson: Lavon Paganini Treating Doak Mah/Extender: Tito Dine in Treatment: 7 Vital Signs Time Taken: 11:04 Temperature (F): 97.8 Height (in): 67 Pulse (bpm): 87 Weight (lbs): 230 Respiratory Rate (breaths/min): 16 Body Mass Index (BMI): 36 Blood Pressure (mmHg): 134/72 Reference Range: 80 - 120 mg / dl Electronic  Signature(s) Signed: 06/26/2022 3:54:37 PM By: Carlene Coria RN Entered By: Carlene Coria on 06/25/2022 11:04:28

## 2022-06-26 NOTE — Progress Notes (Signed)
Newbold, KHILYNN BORNTREGER ([id number]) Visit Report for 06/25/2022 HPI Details Patient Name: [name], [name] Date of Service: 06/25/2022 11:00 AM Medical Record Number: [id number] Patient Account Number: [id number] Date of Birth/Sex: Nov 05, 1951 (71 y.o. F) Treating RN: Carlene Coria Primary Care Provider: Lavon Paganini Other Clinician: Referring Provider: Lavon Paganini Treating Provider/Extender: Tito Dine in Treatment: 7 History of Present Illness Location: left lower extremity Severity: improved since her initial hospitalization but stable since Duration: 6 weeks Context: began after her third treatment of Remicade for rheumatoid arthritis Modifying Factors: morbid obesity, chronic venous hypertension, lymphedema, diabetes mellitus type 2, methotrexate use, Remicade use, Associated Signs and Symptoms: pain and swelling left lower extremity HPI Description: she was seen last week where a 2 layer light compression system was applied. She removed that on Sunday because of itching. She states that she did see her primary care physician who has prescribed her a stronger diuretic and she will begin that today. When she removed her dressing she applied some Neosporin and some gauze. There remains considerable concern about her ability to keep the wrap in place for a week she has been prescribed lymphedema pumps but she does not use those either. We will encourage her to use those twice a day for an hour each time. These were prescribed to her by Dr. Hinton Lovely READMISSION 01/04/17; this is a patient who is a type II diabetic on oral agents. I note she was seen in this clinic 2-3 years ago. I was not involved in her care at that point. She tells [name] that she is had ulcers on her lateral left lower extremity since just before Christmas. There was no obvious precipitant to this. She apparently has been on long-term suppressive penicillin prescribed by Dr. 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 She has noted increasing erythema along with all of this and I think this is what prompted her to come in today. She does not have a known history of PAD or neuropathy ABI in this clinic was 1.06 on the left. The patient has not been requiring any compression on her legs although she does have compression stockings at home as well as external compression pumps. She has been using neither of these. She has not been systemically unwell the patient is also known to the local vein and vascular group. Apparently her external compression pumps were previously prescribed by Dr. Delana Meyer. She has had apparent bilateral ablations done in the past although I have none of these records. 01/15/17; the patient returns for rewrap of her left leg last week and reports that was put on too tight, she had to remove it on Sunday. She states this actually occurred her even though the previous 3 lateral wrap she was able to tolerate well. I don't think there is been much in the way of expansion of the erythema on the lateral left leg lateral left heel and lateral left foot. As mentioned previously I think this is chronic venous insufficiency and not really an active cellulitis. She has 2 small open areas in the lower left calf 01/22/17; the patient complains of really generalized pain. She has rheumatoid arthritis as well as fibromyalgia. She had to take the wrap off yesterday. She has small open areas on the lateral left heel and foot. 02/01/2017 -- the patient is seen by me today as she's had compression wraps for 10 days  and I understand she did not realize she had to come back to see [name]. She did see Dr. Delana Meyer yesterday and his notes are not in, but the patient tells [name] that he is going to recommend a lymphedema pump and has not recommended any further venous workup or intervention. 02/06/17; the patient  went to see Dr. Hinton Lovely of vascular surgery although I don't remember specifically being involved with this discussion. He felt she had chronic venous insufficiency without surgery or intervention necessary at this time. Also noted lymphedema and type 2 diabetes. In the meantime she did not tolerate the wrap on the right leg stating that the small wound anteriorly" burns like fire" 02/13/17; the lesions on the left lateral and left medial leg have closed over. Still looks a little vulnerable on the lateral aspect on the left Right; still an open area here but smaller and appears to be well granulated. Using Silver Collegen. She uses her own wrap on the right leg last week and I think week and continue that on both legs this week 02/26/17; the lesion on the left lateral leg and left medial leg are both closed over. Still to open areas on the right medial leg. She is been using her own stockings and the edema control looks adequate 03/12/17 she has no open areas on the left lateral or left medial leg. She still has an open area on the right medial leg and last week we do find an area over the right medial malleolus. She is been to see vascular surgery who did not recommend any further venous workup. She has lymphedema pumps which she uses although edema doesn't appear to be a big part of her current nonhealing state. She has been using Prisma Readmission: 05-07-2022 upon evaluation today patient presents for initial inspection here in our clinic concerning a surgical wound over the lower abdominal area. She had a panniculectomy which was performed on 02-05-2022 by Dr. Audelia Hives. Initially the surgery with Dr. Marla Roe was successful seemingly until the patient was seen initially on 03-02-2022 for ED to hospital admission due to cellulitis in this area. She subsequently had surgery which was irrigation and debridement with wound VAC placement that was on 03-04-2022. Unfortunately the wound VAC was  never really significantly successful and the patient had a second hospital admission on 03-13-2022. She has had visit to the ER since on 04-05-2022. Eventually she decided to put in a referral herself to come in for further evaluation here at the wound center. She is having a hard time getting this to heal and to be honest wanted some help. With that being said she is now outside of her 90-day postop global period which is great news. All things being said I do think this is good to be a fairly complicated process to heal due to the fact that there are some significant tunnel areas at 3:00 and 9:00 along with the depth straight in. The patient is aware that this is probably get a take quite a bit of time but nonetheless does want help in doing the best thing possible to get it to heal as quickly as possible and I think that we can definitely help with that. Patient does have a history of diabetes her most recent hemoglobin A1c was 7.4 and that was on 03-03-2022. This was a little higher than her previous 2 readings at 3 and 6 months which were at 6.8. Otherwise the patient does have a history of hypertension as well  as congestive heart failure as well. She seems to be doing okay in regard to those issues. Larsson, Miriya D. ([id number]) 05-14-2022 upon evaluation today patient appears to be doing well currently in regard to her wound although she has not had the [facility] this was not used by home health they said they could not find her on the formulary. I know we have had patients in the past with [facility] did use Hydrofera Blue however. Nonetheless I think that we already need to go that way she states that it seemed to do much better than the alginate which just does not catch and is much of the drainage at this point. Overall I do not see any signs of active infection locally or systemically at this time. 05-21-2022 upon evaluation today patient appears to be doing well currently in regard to her  wound. I am actually very pleased with where things stand I think the Hydrofera Blue blue is doing a pretty good job. With that being said I do think that she is going to need to have the [facility] ready transfer not just the ready at this point. It has a backing on it the wound is being used and has not really can help her significantly at this point. 05-28-2022 upon evaluation today patient appears to be doing well currently in regard to her wound this is definitely showing signs of improvement I did review her culture as well and she is on Cipro which is the appropriate medication she did indeed have a Pseudomonas infection which is what we presume last week as well. 06-18-2022 upon evaluation today patient appears resents for reevaluation in the clinic she is actually doing much better. The size of the wound is improved but more importantly the undermining area specifically the tunneling that went towards the left side of her body has pretty much completely filled in. This is great news and I do not see any signs of active infection locally or systemically at this time which is excellent. 7/24; continued improvement in the wound in the right lower abdomen. Using Hydrofera Blue, nystatin powder and an ABD pad. The patient has a home health nurse changing the dressing Electronic Signature(s) Signed: 06/25/2022 4:17:57 PM By: Linton Ham MD Entered By: Linton Ham on 06/25/2022 11:16:21 Baker, Aileene D. ([id number]) -------------------------------------------------------------------------------- Physical Exam Details Patient Name: Bronkema, Alfie D. Date of Service: 06/25/2022 11:00 AM Medical Record Number: [id number] Patient Account Number: [id number] Date of Birth/Sex: Apr 16, 1951 (71 y.o. F) Treating RN: Carlene Coria Primary Care Provider: Lavon Paganini Other Clinician: Referring Provider: Lavon Paganini Treating Provider/Extender: Tito Dine in Treatment:  7 Constitutional Sitting or standing Blood Pressure is within target range for patient.. Pulse regular and within target range for patient.[name] Respirations regular, non- labored and within target range.. Temperature is normal and within the target range for the patient.[name] appears in no distress. Notes Wound exam; deep punched-out area however the only remanent is the wound at the base of this. This is in the right lower quadrant. There is no periwound tenderness some erythema but I think this is also a lot better. At the base of the wound there is a small open area remaining Electronic Signature(s) Signed: 06/25/2022 4:17:57 PM By: Linton Ham MD Entered By: Linton Ham on 06/25/2022 11:17:04 Longfield, Shanell D. ([id number]) -------------------------------------------------------------------------------- Physician Orders Details Patient Name: Olden, Adelai D. Date of Service: 06/25/2022 11:00 AM Medical Record Number: [id number] Patient Account Number: [id number] Date of Birth/Sex: [birth year] (71 y.o. F)  Treating RN: Carlene Coria Primary Care Provider: Lavon Paganini Other Clinician: Referring Provider: Lavon Paganini Treating Provider/Extender: Tito Dine in Treatment: 7 Verbal / Phone Orders: No Diagnosis Coding Follow-up Appointments o Return Appointment in 2 weeks. [place] for wound care. May utilize formulary equivalent dressing for wound treatment orders unless otherwise specified. Home Health Nurse may visit [facility] to address patientos wound care needs. Jackquline [place] [phone] o **Please direct any NON-WOUND related issues/requests for orders to patient's Primary Care Physician. **If current dressing causes regression in wound condition, may D/C ordered dressing product/s and apply Normal Saline Moist Dressing daily until next [place] or Other MD appointment. **Notify [facility] of regression in wound  condition at [phone]. Bathing/ Shower/ Hygiene o May shower; gently cleanse wound with antibacterial soap, rinse and pat dry prior to dressing wounds - on days dressing changed Wound Treatment Wound #9 - Abdomen - Lower Quadrant Wound Laterality: Right Topical: Nystop Nystatin Powder, 15 (g) bottle 3 x Per Week/30 Days Primary Dressing: Hydrofera Blue Ready Transfer Foam, 4x5 (in/in) 3 x Per Week/30 Days Discharge Instructions: PACK LIGHTLY Secondary Dressing: Gauze 3 x Per Week/30 Days Discharge Instructions: As directed: dry, moistened with saline or moistened with Dakins Solution Secured With: Medipore Tape - 49M Medipore H Soft Cloth Surgical Tape, 2x2 (in/yd) 3 x Per Week/30 Days Electronic Signature(s) Signed: 06/25/2022 4:17:57 PM By: Linton Ham MD Signed: 06/26/2022 3:54:37 PM By: Carlene Coria RN Entered By: Carlene Coria on 06/25/2022 11:20:55 Aldous, Dionicia D. ([id number]) -------------------------------------------------------------------------------- Problem List Details Patient Name: Nigg, Sunita D. Date of Service: 06/25/2022 11:00 AM Medical Record Number: [id number] Patient Account Number: [id number] Date of Birth/Sex: Jan 19, 1951 (71 y.o. 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 ([id number]) -------------------------------------------------------------------------------- Progress Note Details Patient Name: Mungo, Latonda D. Date of Service: 06/25/2022 11:00 AM Medical Record Number: [id number] Patient Account Number: [id number] Date of Birth/Sex: [birth year] (71 y.o. F) Treating RN: Carlene Coria Primary Care Provider: Lavon Paganini Other Clinician: Referring Provider: Lavon Paganini Treating Provider/Extender: Tito Dine in Treatment: 7 Subjective History of Present Illness (HPI) The following HPI elements were documented for the patient's wound: Location: left lower extremity Severity: improved since her initial hospitalization but stable since Duration: 6 weeks Context: began after her third treatment of Remicade for rheumatoid arthritis Modifying Factors: morbid obesity, chronic venous hypertension, lymphedema, diabetes mellitus type 2, methotrexate use, Remicade use, Associated Signs and Symptoms: pain and swelling left lower extremity she was seen last week where a 2 layer light compression system was applied. She removed that on Sunday because of itching. She states that she did see her primary care physician who has prescribed her a stronger diuretic and she will begin that today. When she removed her  dressing she applied some Neosporin and some gauze. There remains considerable concern about her ability to keep the wrap in place for a week she has been prescribed lymphedema pumps but she does not use those either. We will encourage her to use those twice a day for an hour each time. These were prescribed to her by Dr. Hinton Lovely READMISSION 01/04/17; this is a patient who is a type II diabetic on oral agents. I  note she was seen in this clinic 2-3 years ago. I was not involved in her care at that point. She tells [name] that she is had ulcers on her lateral left lower extremity since just before Christmas. There was no obvious precipitant to this. She apparently has been on long-term suppressive penicillin prescribed by Dr. 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 She has noted increasing erythema along with all of this and I think this is what prompted her to come in today. She does not have a known history of PAD or neuropathy ABI in this clinic was 1.06 on the left. The patient has not been requiring any compression on her legs although she does have compression stockings at home as well as external compression pumps. She has been using neither of these. She has not been systemically unwell the patient is also known to the local vein and vascular group. Apparently her external compression pumps were previously prescribed by Dr. Delana Meyer. She has had apparent bilateral ablations done in the past although I have none of these records. 01/15/17; the patient returns for rewrap of her left leg last week and reports that was put on too tight, she had to remove it on Sunday. She states this actually occurred her even though the previous 3 lateral wrap she was able to tolerate well. I don't think there is been much in the way of expansion of the erythema on the lateral left leg lateral left heel and lateral left foot. As mentioned previously I think this is chronic venous insufficiency and not really an active cellulitis. She has 2 small open areas in the lower left calf 01/22/17; the patient complains of really generalized pain. She has rheumatoid arthritis as well as fibromyalgia. She had to take the wrap off yesterday. She has small open areas on the lateral left heel and foot. 02/01/2017 -- the patient is  seen by me today as she's had compression wraps for 10 days and I understand she did not realize she had to come back to see [name]. She did see Dr. Delana Meyer yesterday and his notes are not in, but the patient tells [name] that he is going to recommend a lymphedema pump and has not recommended any further venous workup or intervention. 02/06/17; the patient went to see Dr. Hinton Lovely of vascular surgery although I don't remember specifically being involved with this discussion. He felt she had chronic venous insufficiency without surgery or intervention necessary at this time. Also noted lymphedema and type 2 diabetes. In the meantime she did not tolerate the wrap on the right leg stating that the small wound anteriorly" burns like fire" 02/13/17; the lesions on the left lateral and left medial leg have closed over. Still looks a little vulnerable on the lateral aspect on the left Right; still an open area here but smaller and appears to be well granulated. Using Silver Collegen. She uses her own wrap on the right  leg last week and I think week and continue that on both legs this week 02/26/17; the lesion on the left lateral leg and left medial leg are both closed over. Still to open areas on the right medial leg. She is been using her own stockings and the edema control looks adequate 03/12/17 she has no open areas on the left lateral or left medial leg. She still has an open area on the right medial leg and last week we do find an area over the right medial malleolus. She is been to see vascular surgery who did not recommend any further venous workup. She has lymphedema pumps which she uses although edema doesn't appear to be a big part of her current nonhealing state. She has been using Prisma Readmission: 05-07-2022 upon evaluation today patient presents for initial inspection here in our clinic concerning a surgical wound over the lower abdominal area. She had a panniculectomy which was performed on 02-05-2022 by  Dr. Audelia Hives. Initially the surgery with Dr. Marla Roe was successful seemingly until the patient was seen initially on 03-02-2022 for ED to hospital admission due to cellulitis in this area. She subsequently had surgery which was irrigation and debridement with wound VAC placement that was on 03-04-2022. Unfortunately the wound VAC was never really significantly successful and the patient had a second hospital admission on 03-13-2022. She has had visit to the ER since on 04-05-2022. Eventually she decided to put in a referral herself to come in for further evaluation here at the wound center. She is having a hard time getting this to heal and to be honest wanted some help. With that being said she is now outside of her 90-day postop global period which is great news. All things being said I do think this is good to be a fairly complicated process to heal due to the fact that there are some significant tunnel areas at 3:00 and 9:00 along with the depth straight in. The patient is aware that this is probably get a take quite a bit of time but nonetheless does want help in doing the best thing possible to get it to heal as quickly as possible and I think that we can definitely help with that. Patient does have a history of diabetes her most recent hemoglobin A1c was 7.4 and that was on 03-03-2022. This was a little higher than her previous 2 readings at 3 and 6 months which were at 6.8. Otherwise the patient does have a history of hypertension as well as congestive heart failure as well. She seems to be doing okay in regard to those issues. 05-14-2022 upon evaluation today patient appears to be doing well currently in regard to her wound although she has not had the [facility] Mcmartin, Kenedie D. ([id number]) this was not used by home health they said they could not find her on the formulary. I know we have had patients in the past with [facility] did use Hydrofera Blue however. Nonetheless I think that  we already need to go that way she states that it seemed to do much better than the alginate which just does not catch and is much of the drainage at this point. Overall I do not see any signs of active infection locally or systemically at this time. 05-21-2022 upon evaluation today patient appears to be doing well currently in regard to her wound. I am actually very pleased with where things stand I think the Hydrofera Blue blue is doing a pretty good  job. With that being said I do think that she is going to need to have the [facility] ready transfer not just the ready at this point. It has a backing on it the wound is being used and has not really can help her significantly at this point. 05-28-2022 upon evaluation today patient appears to be doing well currently in regard to her wound this is definitely showing signs of improvement I did review her culture as well and she is on Cipro which is the appropriate medication she did indeed have a Pseudomonas infection which is what we presume last week as well. 06-18-2022 upon evaluation today patient appears resents for reevaluation in the clinic she is actually doing much better. The size of the wound is improved but more importantly the undermining area specifically the tunneling that went towards the left side of her body has pretty much completely filled in. This is great news and I do not see any signs of active infection locally or systemically at this time which is excellent. 7/24; continued improvement in the wound in the right lower abdomen. Using Hydrofera Blue, nystatin powder and an ABD pad. The patient has a home health nurse changing the dressing Objective Constitutional Sitting or standing Blood Pressure is within target range for patient.. Pulse regular and within target range for patient.[name] Respirations regular, non- labored and within target range.. Temperature is normal and within the target range for the patient.[name] appears in no  distress. Vitals Time Taken: 11:04 AM, Height: 67 in, Weight: 230 lbs, BMI: 36, Temperature: 97.8 F, Pulse: 87 bpm, Respiratory Rate: 16 breaths/min, Blood Pressure: 134/72 mmHg. General Notes: Wound exam; deep punched-out area however the only remanent is the wound at the base of this. This is in the right lower quadrant. There is no periwound tenderness some erythema but I think this is also a lot better. At the base of the wound there is a small open area remaining Integumentary (Hair, Skin) Wound #9 status is Open. Original cause of wound was Gradually Appeared. The date acquired was: 01/24/2022. The wound has been in treatment 7 weeks. The wound is located on the Right Abdomen - Lower Quadrant. The wound measures 0.5cm length x 2.5cm width x 1.3cm depth; 0.982cm^2 area and 1.276cm^3 volume. There is Fat Layer (Subcutaneous Tissue) exposed. There is no tunneling or undermining noted. There is a medium amount of serosanguineous drainage noted. There is large (67-100%) red granulation within the wound bed. There is no necrotic tissue within the wound bed. 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 Wyszynski, Mirinda D. ([id number]) Home Health: [facility] for wound care. May utilize formulary equivalent dressing for wound treatment orders unless otherwise specified. Home Health Nurse may visit PRN to address patient s wound care needs. Jackquline [place] [phone] **Please direct any NON-WOUND related issues/requests for orders to patient's Primary Care Physician. **If current  dressing causes regression in wound condition, may D/C ordered dressing product/s and apply Normal Saline Moist Dressing daily until next [place] or Other MD appointment. **Notify [facility] of regression in wound condition at [phone]. Bathing/ Shower/ Hygiene: May shower; gently cleanse wound with antibacterial soap, rinse and pat  dry prior to dressing wounds - on days dressing changed WOUND #9: - Abdomen - Lower Quadrant Wound Laterality: Right Topical: Nystop Nystatin Powder, 15 (g) bottle 3 x Per Week/30 Days Primary Dressing: Hydrofera Blue Ready Transfer Foam, 4x5 (in/in) 3 x Per Week/30 Days Discharge Instructions: PACK LIGHTLY Secondary Dressing: Gauze 3 x Per Week/30 Days Discharge Instructions: As directed: dry, moistened with saline or moistened with Dakins Solution Secured With: Medipore Tape - 72M Medipore H Soft Cloth Surgical Tape, 2x2 (in/yd) 3 x Per Week/30 Days 1. Small remnant of the original wound is all that remains here. Hopefully this will epithelialize although it is doing it in the remanent divot. No evidence of significant infection 2. Continue with the same dressing as outlined Electronic Signature(s) Signed: 06/25/2022 4:17:57 PM By: Linton Ham MD Entered By: Linton Ham on 06/25/2022 11:17:44 Reddick, Brendolyn D. ([id number]) -------------------------------------------------------------------------------- SuperBill Details Patient Name: Leija, Zariah D. Date of Service: 06/25/2022 Medical Record Number: [id number] Patient Account Number: [id number] Date of Birth/Sex: 1950/12/08 (71 y.o. F) Treating RN: Carlene Coria Primary Care Provider: Lavon Paganini Other Clinician: Referring Provider: Lavon Paganini Treating Provider/Extender: Tito Dine in Treatment: 7 Diagnosis Coding ICD-10 Codes Code Description T81.31XA Disruption of external operation (surgical) wound, not elsewhere classified, initial  encounter L02.211 Cutaneous abscess of abdominal wall L98.492 Non-pressure chronic ulcer of skin of other sites with fat layer exposed I50.42 Chronic combined systolic (congestive) and diastolic (congestive) heart failure E11.622 Type 2 diabetes mellitus with other skin ulcer I10 Essential (primary) hypertension Facility Procedures CPT4 Code: 99242683 Description: [phone] - WOUND CARE VISIT-LEV 2 EST PT Modifier: Quantity: 1 Physician Procedures CPT4 Code: 2297989 Description: 99213 - WC PHYS LEVEL 3 - EST PT Modifier: Quantity: 1 CPT4 Code: Description: ICD-10 Diagnosis Description T81.31XA Disruption of external operation (surgical) wound, not elsewhere classifi L98.492 Non-pressure chronic ulcer of skin of other sites with fat layer exposed L02.211 Cutaneous abscess of abdominal wall Modifier: ed, initial encounter Quantity: Electronic Signature(s) Signed: 06/25/2022 4:24:58 PM By: Carlene Coria RN Signed: 06/26/2022 8:06:10 AM By: Linton Ham MD Previous Signature: 06/25/2022 4:17:57 PM Version By: Linton Ham MD Entered By: Carlene Coria on 06/25/2022 16:24:58

## 2022-06-27 DIAGNOSIS — M545 Low back pain, unspecified: Secondary | ICD-10-CM | POA: Diagnosis not present

## 2022-06-27 DIAGNOSIS — Z9181 History of falling: Secondary | ICD-10-CM | POA: Diagnosis not present

## 2022-06-27 DIAGNOSIS — I152 Hypertension secondary to endocrine disorders: Secondary | ICD-10-CM | POA: Diagnosis not present

## 2022-06-27 DIAGNOSIS — T8141XD Infection following a procedure, superficial incisional surgical site, subsequent encounter: Secondary | ICD-10-CM | POA: Diagnosis not present

## 2022-06-27 DIAGNOSIS — M069 Rheumatoid arthritis, unspecified: Secondary | ICD-10-CM | POA: Diagnosis not present

## 2022-06-27 DIAGNOSIS — I878 Other specified disorders of veins: Secondary | ICD-10-CM | POA: Diagnosis not present

## 2022-06-27 DIAGNOSIS — E1159 Type 2 diabetes mellitus with other circulatory complications: Secondary | ICD-10-CM | POA: Diagnosis not present

## 2022-06-27 DIAGNOSIS — M1990 Unspecified osteoarthritis, unspecified site: Secondary | ICD-10-CM | POA: Diagnosis not present

## 2022-06-27 DIAGNOSIS — I110 Hypertensive heart disease with heart failure: Secondary | ICD-10-CM | POA: Diagnosis not present

## 2022-06-27 DIAGNOSIS — G8929 Other chronic pain: Secondary | ICD-10-CM | POA: Diagnosis not present

## 2022-06-27 DIAGNOSIS — Z87891 Personal history of nicotine dependence: Secondary | ICD-10-CM | POA: Diagnosis not present

## 2022-06-27 DIAGNOSIS — I2510 Atherosclerotic heart disease of native coronary artery without angina pectoris: Secondary | ICD-10-CM | POA: Diagnosis not present

## 2022-06-27 DIAGNOSIS — F32A Depression, unspecified: Secondary | ICD-10-CM | POA: Diagnosis not present

## 2022-06-27 DIAGNOSIS — Z7984 Long term (current) use of oral hypoglycemic drugs: Secondary | ICD-10-CM | POA: Diagnosis not present

## 2022-06-27 DIAGNOSIS — I5022 Chronic systolic (congestive) heart failure: Secondary | ICD-10-CM | POA: Diagnosis not present

## 2022-06-27 DIAGNOSIS — E1151 Type 2 diabetes mellitus with diabetic peripheral angiopathy without gangrene: Secondary | ICD-10-CM | POA: Diagnosis not present

## 2022-06-27 DIAGNOSIS — Z96652 Presence of left artificial knee joint: Secondary | ICD-10-CM | POA: Diagnosis not present

## 2022-06-27 DIAGNOSIS — M797 Fibromyalgia: Secondary | ICD-10-CM | POA: Diagnosis not present

## 2022-06-27 DIAGNOSIS — T8131XD Disruption of external operation (surgical) wound, not elsewhere classified, subsequent encounter: Secondary | ICD-10-CM | POA: Diagnosis not present

## 2022-06-27 DIAGNOSIS — E785 Hyperlipidemia, unspecified: Secondary | ICD-10-CM | POA: Diagnosis not present

## 2022-06-27 DIAGNOSIS — I872 Venous insufficiency (chronic) (peripheral): Secondary | ICD-10-CM | POA: Diagnosis not present

## 2022-06-27 DIAGNOSIS — M48061 Spinal stenosis, lumbar region without neurogenic claudication: Secondary | ICD-10-CM | POA: Diagnosis not present

## 2022-07-02 ENCOUNTER — Ambulatory Visit: Payer: Medicare Other | Admitting: Family Medicine

## 2022-07-02 ENCOUNTER — Encounter: Payer: Medicare Other | Admitting: Physician Assistant

## 2022-07-03 DIAGNOSIS — E1159 Type 2 diabetes mellitus with other circulatory complications: Secondary | ICD-10-CM | POA: Diagnosis not present

## 2022-07-03 DIAGNOSIS — M48061 Spinal stenosis, lumbar region without neurogenic claudication: Secondary | ICD-10-CM | POA: Diagnosis not present

## 2022-07-03 DIAGNOSIS — F32A Depression, unspecified: Secondary | ICD-10-CM | POA: Diagnosis not present

## 2022-07-03 DIAGNOSIS — G8929 Other chronic pain: Secondary | ICD-10-CM | POA: Diagnosis not present

## 2022-07-03 DIAGNOSIS — E1151 Type 2 diabetes mellitus with diabetic peripheral angiopathy without gangrene: Secondary | ICD-10-CM | POA: Diagnosis not present

## 2022-07-03 DIAGNOSIS — T8131XD Disruption of external operation (surgical) wound, not elsewhere classified, subsequent encounter: Secondary | ICD-10-CM | POA: Diagnosis not present

## 2022-07-03 DIAGNOSIS — I872 Venous insufficiency (chronic) (peripheral): Secondary | ICD-10-CM | POA: Diagnosis not present

## 2022-07-03 DIAGNOSIS — Z96652 Presence of left artificial knee joint: Secondary | ICD-10-CM | POA: Diagnosis not present

## 2022-07-03 DIAGNOSIS — Z9181 History of falling: Secondary | ICD-10-CM | POA: Diagnosis not present

## 2022-07-03 DIAGNOSIS — M797 Fibromyalgia: Secondary | ICD-10-CM | POA: Diagnosis not present

## 2022-07-03 DIAGNOSIS — I152 Hypertension secondary to endocrine disorders: Secondary | ICD-10-CM | POA: Diagnosis not present

## 2022-07-03 DIAGNOSIS — M069 Rheumatoid arthritis, unspecified: Secondary | ICD-10-CM | POA: Diagnosis not present

## 2022-07-03 DIAGNOSIS — T8141XD Infection following a procedure, superficial incisional surgical site, subsequent encounter: Secondary | ICD-10-CM | POA: Diagnosis not present

## 2022-07-03 DIAGNOSIS — I2510 Atherosclerotic heart disease of native coronary artery without angina pectoris: Secondary | ICD-10-CM | POA: Diagnosis not present

## 2022-07-03 DIAGNOSIS — Z7984 Long term (current) use of oral hypoglycemic drugs: Secondary | ICD-10-CM | POA: Diagnosis not present

## 2022-07-03 DIAGNOSIS — I878 Other specified disorders of veins: Secondary | ICD-10-CM | POA: Diagnosis not present

## 2022-07-03 DIAGNOSIS — I5022 Chronic systolic (congestive) heart failure: Secondary | ICD-10-CM | POA: Diagnosis not present

## 2022-07-03 DIAGNOSIS — Z87891 Personal history of nicotine dependence: Secondary | ICD-10-CM | POA: Diagnosis not present

## 2022-07-03 DIAGNOSIS — M1990 Unspecified osteoarthritis, unspecified site: Secondary | ICD-10-CM | POA: Diagnosis not present

## 2022-07-03 DIAGNOSIS — I110 Hypertensive heart disease with heart failure: Secondary | ICD-10-CM | POA: Diagnosis not present

## 2022-07-03 DIAGNOSIS — M545 Low back pain, unspecified: Secondary | ICD-10-CM | POA: Diagnosis not present

## 2022-07-03 DIAGNOSIS — E785 Hyperlipidemia, unspecified: Secondary | ICD-10-CM | POA: Diagnosis not present

## 2022-07-04 ENCOUNTER — Encounter: Payer: Self-pay | Admitting: Family Medicine

## 2022-07-04 ENCOUNTER — Ambulatory Visit (INDEPENDENT_AMBULATORY_CARE_PROVIDER_SITE_OTHER): Payer: Medicare Other | Admitting: Family Medicine

## 2022-07-04 VITALS — BP 140/82 | HR 94 | Temp 98.3°F | Resp 16 | Wt 241.3 lb

## 2022-07-04 DIAGNOSIS — M25511 Pain in right shoulder: Secondary | ICD-10-CM | POA: Diagnosis not present

## 2022-07-04 DIAGNOSIS — L650 Telogen effluvium: Secondary | ICD-10-CM | POA: Diagnosis not present

## 2022-07-04 DIAGNOSIS — L659 Nonscarring hair loss, unspecified: Secondary | ICD-10-CM

## 2022-07-04 DIAGNOSIS — M25512 Pain in left shoulder: Secondary | ICD-10-CM | POA: Diagnosis not present

## 2022-07-04 DIAGNOSIS — G8929 Other chronic pain: Secondary | ICD-10-CM

## 2022-07-04 DIAGNOSIS — R45851 Suicidal ideations: Secondary | ICD-10-CM | POA: Diagnosis not present

## 2022-07-04 DIAGNOSIS — F332 Major depressive disorder, recurrent severe without psychotic features: Secondary | ICD-10-CM

## 2022-07-04 NOTE — Progress Notes (Signed)
SUBJECTIVE:   CHIEF COMPLAINT / HPI:   MDD - Medications: wellbutrin, cymbalta - referred to Psych at last appt, did not answer call to schedule. Called back to schedule but LVM and didn't hear back.  - Taking: hasn't started new dose yet - Counseling: no - Previous hospitalizations: no - Current stressors: health and financial troubles, not close with children - Coping Mechanisms: being around friends, sister, dog  - denies SI since last appointment     07/04/2022    1:48 PM 06/19/2022   11:03 AM 05/31/2022    1:49 PM  Depression screen PHQ 2/9  Decreased Interest 1 0 1  Down, Depressed, Hopeless 1 0 3  PHQ - 2 Score 2 0 4  Altered sleeping 0  0  Tired, decreased energy 2  1  Change in appetite 1  1  Feeling bad or failure about yourself  1  1  Trouble concentrating 0  3  Moving slowly or fidgety/restless 1  0  Suicidal thoughts 0  1  PHQ-9 Score 7  11  Difficult doing work/chores Not difficult at all  Not difficult at all      07/04/2022    2:01 PM 05/31/2022    1:52 PM 04/17/2021    1:29 PM 11/11/2020    8:32 AM  GAD 7 : Generalized Anxiety Score  Nervous, Anxious, on Edge '1 2 2 '$ 0  Control/stop worrying '1 3 2   '$ Worry too much - different things '1 1 3 '$ 0  Trouble relaxing '1 1 2 '$ 0  Restless '1 1 1 '$ 0  Easily annoyed or irritable 0 0 0 0  Afraid - awful might happen '1 2 1 '$ 0  Total GAD 7 Score '6 10 11   '$ Anxiety Difficulty Somewhat difficult Extremely difficult Somewhat difficult Not difficult at all   Hair loss - falling out noticing about 2 weeks ago when combing hair - scalp itching some - no pain. - does not use dryer, flat iron - has not color treated since this started.  - taking biotin  Shoulder pain - following with Ortho, PT - h/o RA, previously followed by Rheum but no longer on DMARD. - previously given prednisone at last appt , didn't help much - has seen "tears" on XR. Planning on MRI after abdominal wound heals.   OBJECTIVE:   BP (!) 140/82  (BP Location: Left Wrist, Patient Position: Sitting, Cuff Size: Normal)   Pulse 94   Temp 98.3 F (36.8 C) (Oral)   Resp 16   Wt 241 lb 4.8 oz (109.5 kg)   LMP  (LMP Unknown) Comment: age 71  BMI 37.79 kg/m   Gen: well appearing, in NAD Card: RRR Lungs: CTAB Ext: WWP, no edema   ASSESSMENT/PLAN:   Bilateral shoulder pain Await MRI. Continue to follow with Ortho.   MDD (major depressive disorder) Chronic, not well controlled. Worsened by ongoing health, financial, and relational stress. Improved PHQ/GAD scoring today but with persistent symptoms and was not able to get increased dose of wellbutrin. Encouraged to get from pharmacy and to let us know if she has trouble getting it. Will replace referral to Psych, encouraged to answer when called to schedule appointment. No SI currently, contracted for safety. Has coping mechanisms in place. F/u 4 weeks after starting increased wellbutrin.  Telogen effluvium Ongoing x2 weeks. Also noted after COVID last year. Hair pull test negative. No abnormalities with scalp noted to concern for infection, dermatologic pathology. 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 Will check TSH, CBC though advised patient thyroid labs may not be accurate given use of biotin. If abnormal, plan to repeat off biotin.      Myles Gip, DO

## 2022-07-04 NOTE — Assessment & Plan Note (Addendum)
Chronic, not well controlled. Worsened by ongoing health, financial, and relational stress. Improved PHQ/GAD scoring today but with persistent symptoms and was not able to get increased dose of wellbutrin. Encouraged to get from pharmacy and to let us know if she has trouble getting it. Will replace referral to Psych, encouraged to answer when called to schedule appointment. No SI currently, contracted for safety. Has coping mechanisms in place. F/u 4 weeks after starting increased wellbutrin.

## 2022-07-04 NOTE — Assessment & Plan Note (Signed)
Ongoing x2 weeks. Also noted after COVID last year. Hair pull test negative. No abnormalities with scalp noted to concern for infection, dermatologic pathology. 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 Will check TSH, CBC though advised patient thyroid labs may not be accurate given use of biotin. If abnormal, plan to repeat off biotin.

## 2022-07-04 NOTE — Assessment & Plan Note (Signed)
Await MRI. Continue to follow with Ortho.

## 2022-07-05 LAB — CBC WITH DIFFERENTIAL/PLATELET
Basophils Absolute: 0 10*3/uL (ref 0.0–0.2)
Basos: 1 %
EOS (ABSOLUTE): 0.2 10*3/uL (ref 0.0–0.4)
Eos: 3 %
Hematocrit: 32.7 % — ABNORMAL LOW (ref 34.0–46.6)
Hemoglobin: 9.9 g/dL — ABNORMAL LOW (ref 11.1–15.9)
Immature Grans (Abs): 0 10*3/uL (ref 0.0–0.1)
Immature Granulocytes: 0 %
Lymphocytes Absolute: 1.4 10*3/uL (ref 0.7–3.1)
Lymphs: 21 %
MCH: 21.2 pg — ABNORMAL LOW (ref 26.6–33.0)
MCHC: 30.3 g/dL — ABNORMAL LOW (ref 31.5–35.7)
MCV: 70 fL — ABNORMAL LOW (ref 79–97)
Monocytes Absolute: 0.5 10*3/uL (ref 0.1–0.9)
Monocytes: 8 %
Neutrophils Absolute: 4.3 10*3/uL (ref 1.4–7.0)
Neutrophils: 67 %
Platelets: 352 10*3/uL (ref 150–450)
RBC: 4.66 x10E6/uL (ref 3.77–5.28)
RDW: 16.7 % — ABNORMAL HIGH (ref 11.7–15.4)
WBC: 6.4 10*3/uL (ref 3.4–10.8)

## 2022-07-05 LAB — FERRITIN: Ferritin: 35 ng/mL (ref 15–150)

## 2022-07-05 LAB — TSH: TSH: 2.1 u[IU]/mL (ref 0.450–4.500)

## 2022-07-06 DIAGNOSIS — Z96652 Presence of left artificial knee joint: Secondary | ICD-10-CM | POA: Diagnosis not present

## 2022-07-06 DIAGNOSIS — I110 Hypertensive heart disease with heart failure: Secondary | ICD-10-CM | POA: Diagnosis not present

## 2022-07-06 DIAGNOSIS — F32A Depression, unspecified: Secondary | ICD-10-CM | POA: Diagnosis not present

## 2022-07-06 DIAGNOSIS — E785 Hyperlipidemia, unspecified: Secondary | ICD-10-CM | POA: Diagnosis not present

## 2022-07-06 DIAGNOSIS — M069 Rheumatoid arthritis, unspecified: Secondary | ICD-10-CM | POA: Diagnosis not present

## 2022-07-06 DIAGNOSIS — I5022 Chronic systolic (congestive) heart failure: Secondary | ICD-10-CM | POA: Diagnosis not present

## 2022-07-06 DIAGNOSIS — I152 Hypertension secondary to endocrine disorders: Secondary | ICD-10-CM | POA: Diagnosis not present

## 2022-07-06 DIAGNOSIS — I878 Other specified disorders of veins: Secondary | ICD-10-CM | POA: Diagnosis not present

## 2022-07-06 DIAGNOSIS — Z87891 Personal history of nicotine dependence: Secondary | ICD-10-CM | POA: Diagnosis not present

## 2022-07-06 DIAGNOSIS — M797 Fibromyalgia: Secondary | ICD-10-CM | POA: Diagnosis not present

## 2022-07-06 DIAGNOSIS — Z9181 History of falling: Secondary | ICD-10-CM | POA: Diagnosis not present

## 2022-07-06 DIAGNOSIS — M1990 Unspecified osteoarthritis, unspecified site: Secondary | ICD-10-CM | POA: Diagnosis not present

## 2022-07-06 DIAGNOSIS — E1151 Type 2 diabetes mellitus with diabetic peripheral angiopathy without gangrene: Secondary | ICD-10-CM | POA: Diagnosis not present

## 2022-07-06 DIAGNOSIS — M545 Low back pain, unspecified: Secondary | ICD-10-CM | POA: Diagnosis not present

## 2022-07-06 DIAGNOSIS — Z7984 Long term (current) use of oral hypoglycemic drugs: Secondary | ICD-10-CM | POA: Diagnosis not present

## 2022-07-06 DIAGNOSIS — T8141XD Infection following a procedure, superficial incisional surgical site, subsequent encounter: Secondary | ICD-10-CM | POA: Diagnosis not present

## 2022-07-06 DIAGNOSIS — I2510 Atherosclerotic heart disease of native coronary artery without angina pectoris: Secondary | ICD-10-CM | POA: Diagnosis not present

## 2022-07-06 DIAGNOSIS — G8929 Other chronic pain: Secondary | ICD-10-CM | POA: Diagnosis not present

## 2022-07-06 DIAGNOSIS — E1159 Type 2 diabetes mellitus with other circulatory complications: Secondary | ICD-10-CM | POA: Diagnosis not present

## 2022-07-06 DIAGNOSIS — M48061 Spinal stenosis, lumbar region without neurogenic claudication: Secondary | ICD-10-CM | POA: Diagnosis not present

## 2022-07-06 DIAGNOSIS — T8131XD Disruption of external operation (surgical) wound, not elsewhere classified, subsequent encounter: Secondary | ICD-10-CM | POA: Diagnosis not present

## 2022-07-06 DIAGNOSIS — I872 Venous insufficiency (chronic) (peripheral): Secondary | ICD-10-CM | POA: Diagnosis not present

## 2022-07-09 ENCOUNTER — Ambulatory Visit: Payer: Self-pay

## 2022-07-09 ENCOUNTER — Encounter: Payer: Medicare Other | Attending: Physician Assistant | Admitting: Physician Assistant

## 2022-07-09 DIAGNOSIS — I890 Lymphedema, not elsewhere classified: Secondary | ICD-10-CM | POA: Diagnosis not present

## 2022-07-09 DIAGNOSIS — X58XXXA Exposure to other specified factors, initial encounter: Secondary | ICD-10-CM | POA: Diagnosis not present

## 2022-07-09 DIAGNOSIS — L98492 Non-pressure chronic ulcer of skin of other sites with fat layer exposed: Secondary | ICD-10-CM | POA: Diagnosis not present

## 2022-07-09 DIAGNOSIS — L02211 Cutaneous abscess of abdominal wall: Secondary | ICD-10-CM | POA: Insufficient documentation

## 2022-07-09 DIAGNOSIS — I110 Hypertensive heart disease with heart failure: Secondary | ICD-10-CM | POA: Insufficient documentation

## 2022-07-09 DIAGNOSIS — I872 Venous insufficiency (chronic) (peripheral): Secondary | ICD-10-CM | POA: Diagnosis not present

## 2022-07-09 DIAGNOSIS — I5042 Chronic combined systolic (congestive) and diastolic (congestive) heart failure: Secondary | ICD-10-CM | POA: Diagnosis not present

## 2022-07-09 DIAGNOSIS — Z7984 Long term (current) use of oral hypoglycemic drugs: Secondary | ICD-10-CM | POA: Insufficient documentation

## 2022-07-09 DIAGNOSIS — T8131XA Disruption of external operation (surgical) wound, not elsewhere classified, initial encounter: Secondary | ICD-10-CM | POA: Insufficient documentation

## 2022-07-09 DIAGNOSIS — Z6836 Body mass index (BMI) 36.0-36.9, adult: Secondary | ICD-10-CM | POA: Diagnosis not present

## 2022-07-09 DIAGNOSIS — Z79899 Other long term (current) drug therapy: Secondary | ICD-10-CM | POA: Diagnosis not present

## 2022-07-09 DIAGNOSIS — E11622 Type 2 diabetes mellitus with other skin ulcer: Secondary | ICD-10-CM | POA: Diagnosis not present

## 2022-07-09 NOTE — Telephone Encounter (Signed)
  Chief Complaint: Dizziness, Low BP Symptoms: ibid Frequency: Ongoing HTN and now symptomatic low BP Pertinent Negatives: Patient denies  Disposition: '[]'$ ED /'[x]'$ Urgent Care (no appt availability in office) / '[]'$ Appointment(In office/virtual)/ '[]'$  [place] [facility]/ '[]'$ Home Care/ '[]'$ Refused Recommended Disposition /'[]'$ [place] [facility]/ '[]'$  Follow-up with PCP Additional Notes: PT states that she is feeling dizzy now and again and that her BP was low at her wound care visit this morning.  Pt has a hx of HTN, and is taking her medications as directed.    Summary: Dizziness/BP low   Pt called reporting that she is experiencing dizziness/tired and that her BP is 105/58 on medication. Please advise  Best contact: [phone]      Reason for Disposition  [6] Systolic BP 94-854 AND [6] taking blood pressure medications AND [3] dizzy, lightheaded or weak  Answer Assessment - Initial Assessment Questions 1. BLOOD PRESSURE: "What is the blood pressure?" "Did you take at least two measurements 5 minutes apart?"     105/53 2. ONSET: "When did you take your blood pressure?"     Wound center 3. HOW: "How did you obtain the blood pressure?" (e.g., visiting nurse, automatic home BP monitor)     Automatic at wound care 4. HISTORY: "Do you have a history of low blood pressure?" "What is your blood pressure normally?"     No - history of HTN 5. MEDICINES: "Are you taking any medications for blood pressure?" If Yes, ask: "Have they been changed recently?"     Yes  - no recent changes 6. PULSE RATE: "Do you know what your pulse rate is?"      unknown 7. OTHER SYMPTOMS: "Have you been sick recently?" "Have you had a recent injury?"     In February 8. PREGNANCY: "Is there any chance you are pregnant?" "When was your last menstrual period?"     no  Protocols used: Blood Pressure - Low-A-AH

## 2022-07-09 NOTE — Progress Notes (Addendum)
[name], [name] EADS ([id number]) Visit Report for 07/09/2022 Chief Complaint Document Details Patient Name: [name], [name] Date of Service: 07/09/2022 11:00 AM Medical Record Number: [id number] Patient Account Number: [id number] Date of Birth/Sex: 04-Jul-1951 (71 y.o. F) Treating RN: Carlene Coria Primary Care Provider: Lavon Paganini Other Clinician: Referring Provider: Lavon Paganini Treating Provider/Extender: Skipper Cliche in Treatment: 9 Information Obtained from: Patient Chief Complaint Reopened/Abscessed Surgical Wound on the Lower Abdominal Wall Electronic Signature(s) Signed: 07/09/2022 11:04:58 AM By: Worthy Keeler PA-C Entered By: Worthy Keeler on 07/09/2022 11:04:58 [name], [name] ([id number]) -------------------------------------------------------------------------------- HPI Details Patient Name: [name], [name] Date of Service: 07/09/2022 11:00 AM Medical Record Number: [id number] Patient Account Number: [id number] Date of Birth/Sex: 1951/01/07 (71 y.o. F) Treating RN: Carlene Coria Primary Care Provider: Lavon Paganini Other Clinician: Referring Provider: Lavon Paganini Treating Provider/Extender: Skipper Cliche in Treatment: 9 History of Present Illness Location: left lower extremity Severity: improved since her initial hospitalization but stable since Duration: 6 weeks Context: began after her third treatment of Remicade for rheumatoid arthritis Modifying Factors: morbid obesity, chronic venous hypertension, lymphedema, diabetes mellitus type 2, methotrexate use, Remicade use, Associated Signs and Symptoms: pain and swelling left lower extremity HPI Description: she was seen last week where a 2 layer light compression system was applied. She removed that on Sunday because of itching. She states that she did see her primary care physician who has prescribed her a stronger diuretic and she will begin that today. When she removed her dressing she  applied some Neosporin and some gauze. There remains considerable concern about her ability to keep the wrap in place for a week she has been prescribed lymphedema pumps but she does not use those either. We will encourage her to use those twice a day for an hour each time. These were prescribed to her by Dr. Hinton Lovely READMISSION 01/04/17; this is a patient who is a type II diabetic on oral agents. I note she was seen in this clinic 2-3 years ago. I was not involved in her care at that point. She tells [name] that she is had ulcers on her lateral left lower extremity since just before Christmas. There was no obvious precipitant to this. She apparently has been on long-term suppressive penicillin prescribed by Dr. 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 She has noted increasing erythema along with all of this and I think this is what prompted her to come in today. She does not have a known history of PAD or neuropathy ABI in this clinic was 1.06 on the left. The patient has not been requiring any compression on her legs although she does have compression stockings at home as well as external compression pumps. She has been using neither of these. She has not been systemically unwell the patient is also known to the local vein and vascular group. Apparently her external compression pumps were previously prescribed by Dr. Delana Meyer. She has had apparent bilateral ablations done in the past although I have none of these records. 01/15/17; the patient returns for rewrap of her left leg last week and reports that was put on too tight, she had to remove it on Sunday. She states this actually occurred her even though the previous 3 lateral wrap she was able to tolerate well. I don't think there is been much in the way of expansion of the erythema on the  lateral left leg lateral left heel and lateral left  foot. As mentioned previously I think this is chronic venous insufficiency and not really an active cellulitis. She has 2 small open areas in the lower left calf 01/22/17; the patient complains of really generalized pain. She has rheumatoid arthritis as well as fibromyalgia. She had to take the wrap off yesterday. She has small open areas on the lateral left heel and foot. 02/01/2017 -- the patient is seen by me today as she's had compression wraps for 10 days and I understand she did not realize she had to come back to see [name]. She did see Dr. Delana Meyer yesterday and his notes are not in, but the patient tells [name] that he is going to recommend a lymphedema pump and has not recommended any further venous workup or intervention. 02/06/17; the patient went to see Dr. Hinton Lovely of vascular surgery although I don't remember specifically being involved with this discussion. He felt she had chronic venous insufficiency without surgery or intervention necessary at this time. Also noted lymphedema and type 2 diabetes. In the meantime she did not tolerate the wrap on the right leg stating that the small wound anteriorly" burns like fire" 02/13/17; the lesions on the left lateral and left medial leg have closed over. Still looks a little vulnerable on the lateral aspect on the left Right; still an open area here but smaller and appears to be well granulated. Using Silver Collegen. She uses her own wrap on the right leg last week and I think week and continue that on both legs this week 02/26/17; the lesion on the left lateral leg and left medial leg are both closed over. Still to open areas on the right medial leg. She is been using her own stockings and the edema control looks adequate 03/12/17 she has no open areas on the left lateral or left medial leg. She still has an open area on the right medial leg and last week we do find an area over the right medial malleolus. She is been to see vascular surgery who did not  recommend any further venous workup. She has lymphedema pumps which she uses although edema doesn't appear to be a big part of her current nonhealing state. She has been using Prisma Readmission: 05-07-2022 upon evaluation today patient presents for initial inspection here in our clinic concerning a surgical wound over the lower abdominal area. She had a panniculectomy which was performed on 02-05-2022 by Dr. Audelia Hives. Initially the surgery with Dr. Marla Roe was successful seemingly until the patient was seen initially on 03-02-2022 for ED to hospital admission due to cellulitis in this area. She subsequently had surgery which was irrigation and debridement with wound VAC placement that was on 03-04-2022. Unfortunately the wound VAC was never really significantly successful and the patient had a second hospital admission on 03-13-2022. She has had visit to the ER since on 04-05-2022. Eventually she decided to put in a referral herself to come in for further evaluation here at the wound center. She is having a hard time getting this to heal and to be honest wanted some help. With that being said she is now outside of her 90-day postop global period which is great news. All things being said I do think this is good to be a fairly complicated process to heal due to the fact that there are some significant tunnel areas at 3:00 and 9:00 along with the depth straight in. The patient is aware that  this is probably get a take quite a bit of time but nonetheless does want help in doing the best thing possible to get it to heal as quickly as possible and I think that we can definitely help with that. Patient does have a history of diabetes her most recent hemoglobin A1c was 7.4 and that was on 03-03-2022. This was a little higher than her previous 2 readings at 3 and 6 months which were at 6.8. Otherwise the patient does have a history of hypertension as well as congestive heart failure as well. She seems to be  doing okay in regard to those issues. 05-14-2022 upon evaluation today patient appears to be doing well currently in regard to her wound although she has not had the [facility] this was not used by home health they said they could not find her on the formulary. I know we have had patients in the past with [facility] did use Hydrofera Blue however. Nonetheless I think that we already need to go that way she states that it seemed to do much better than the Colavito, Viktorya D. ([id number]) alginate which just does not catch and is much of the drainage at this point. Overall I do not see any signs of active infection locally or systemically at this time. 05-21-2022 upon evaluation today patient appears to be doing well currently in regard to her wound. I am actually very pleased with where things stand I think the Hydrofera Blue blue is doing a pretty good job. With that being said I do think that she is going to need to have the [facility] ready transfer not just the ready at this point. It has a backing on it the wound is being used and has not really can help her significantly at this point. 05-28-2022 upon evaluation today patient appears to be doing well currently in regard to her wound this is definitely showing signs of improvement I did review her culture as well and she is on Cipro which is the appropriate medication she did indeed have a Pseudomonas infection which is what we presume last week as well. 06-18-2022 upon evaluation today patient appears resents for reevaluation in the clinic she is actually doing much better. The size of the wound is improved but more importantly the undermining area specifically the tunneling that went towards the left side of her body has pretty much completely filled in. This is great news and I do not see any signs of active infection locally or systemically at this time which is excellent. 7/24; continued improvement in the wound in the right lower abdomen.  Using Hydrofera Blue, nystatin powder and an ABD pad. The patient has a home health nurse changing the dressing 07-09-2022 upon evaluation today patient appears to be doing well with regard to her wound which is actually showing signs of being smaller and not nearly as deep as what it was. Fortunately I do think we are on the right track here and this is very close to complete resolution in my opinion it is healing with a divot which makes a little bit more difficult to actually measure depth on but nonetheless this looks awesome. Electronic Signature(s) Signed: 07/09/2022 11:44:30 AM By: Worthy Keeler PA-C Entered By: Worthy Keeler on 07/09/2022 11:44:30 [name], [name] ([id number]) -------------------------------------------------------------------------------- Physical Exam Details Patient Name: [name], [name] Date of Service: 07/09/2022 11:00 AM Medical Record Number: [id number] Patient Account Number: [id number] Date of Birth/Sex: Aug 01, 1951 (71 y.o. F) Treating RN: Epps,  Morey Hummingbird Primary Care Provider: Lavon Paganini Other Clinician: Referring Provider: Lavon Paganini Treating Provider/Extender: Skipper Cliche in Treatment: 44 Constitutional Well-nourished and well-hydrated in no acute distress. Respiratory normal breathing without difficulty. Psychiatric this patient is able to make decisions and demonstrates good insight into disease process. Alert and Oriented x 3. pleasant and cooperative. Notes Upon inspection patient's wound bed actually showed signs of good granulation and epithelization at this point. Fortunately there does not appear to be any evidence of active infection locally or systemically which is great news and overall I am extremely pleased with where things stand. Electronic Signature(s) Signed: 07/09/2022 11:44:46 AM By: Worthy Keeler PA-C Entered By: Worthy Keeler on 07/09/2022 11:44:46 [name], [name]  ([id number]) -------------------------------------------------------------------------------- Physician Orders Details Patient Name: [name], [name] Date of Service: 07/09/2022 11:00 AM Medical Record Number: [id number] Patient Account Number: [id number] Date of Birth/Sex: 16-Jul-1951 (71 y.o. 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 [place] for wound care. May utilize formulary equivalent dressing for wound treatment orders unless otherwise specified. Home Health Nurse may visit [facility] to address patientos wound care needs. Jackquline [place] [phone] o **Please direct any NON-WOUND related issues/requests for orders to patient's Primary Care Physician. **If current dressing causes regression in wound condition, may D/C ordered dressing product/s and apply Normal Saline Moist Dressing daily until next [place] or Other MD appointment. **Notify [facility] Center of regression in wound condition at [phone]. Bathing/ Shower/ Hygiene o May shower; gently cleanse wound with antibacterial soap, rinse and pat dry prior to dressing wounds - on days dressing changed Anesthetic (Use 'Patient Medications' Section for Anesthetic Order Entry) o Lidocaine applied to wound  bed Wound Treatment Wound #9 - Abdomen - Lower Quadrant Wound Laterality: Right Topical: Nystop Nystatin Powder, 15 (g) bottle 3 x Per Week/30 Days Primary Dressing: Hydrofera Blue Ready Transfer Foam, 4x5 (in/in) 3 x Per Week/30 Days Discharge Instructions: PACK LIGHTLY Secondary Dressing: Gauze 3 x Per Week/30 Days Discharge Instructions: As directed: dry, moistened with saline or moistened with Dakins Solution Secured With: Medipore Tape - 1M Medipore H Soft Cloth Surgical Tape, 2x2 (in/yd) 3 x Per Week/30 Days Electronic Signature(s) Signed: 08/02/2022 5:40:47 PM By: Worthy Keeler PA-C Signed: 08/20/2022 2:32:45 PM By: Carlene Coria RN Previous Signature: 07/09/2022 4:15:14 PM Version By: Worthy Keeler PA-C Previous Signature: 07/12/2022 12:35:42 PM Version By: Carlene Coria RN Entered By: Carlene Coria on 07/16/2022 09:30:40 [name], [name] ([id number]) -------------------------------------------------------------------------------- Problem List Details Patient Name: [name], [name] Date of Service: 07/09/2022 11:00 AM Medical Record Number: [id number] Patient Account Number: [id number] Date of Birth/Sex: 01-09-1951 (71 y.o. F) Treating RN: Carlene Coria Primary Care Provider: Lavon Paganini Other Clinician: Referring Provider: Lavon Paganini Treating Provider/Extender: Skipper Cliche in Treatment: 9 Active Problems ICD-10 Encounter Code Description Active Date MDM Diagnosis T81.31XA Disruption of external operation (surgical) wound, not elsewhere 05/07/2022 No Yes classified, initial encounter L02.211 Cutaneous abscess of abdominal wall  05/07/2022 No Yes L98.492 Non-pressure chronic ulcer of skin of other sites with fat layer exposed 05/07/2022 No Yes I50.42 Chronic combined systolic (congestive) and diastolic (congestive) heart 05/07/2022 No Yes failure E11.622 Type 2 diabetes mellitus with other skin ulcer 05/07/2022 No Yes I10 Essential (primary) hypertension 05/07/2022 No  Yes Inactive Problems Resolved Problems Electronic Signature(s) Signed: 07/09/2022 11:04:52 AM By: Worthy Keeler PA-C Entered By: Worthy Keeler on 07/09/2022 11:04:52 [name], [name] ([id number]) -------------------------------------------------------------------------------- Progress Note Details Patient Name: [name], [name] Date of Service: 07/09/2022 11:00 AM Medical Record Number: [id number] Patient Account Number: [id number] Date of Birth/Sex: 1951-03-28 (71 y.o. F) Treating RN: Carlene Coria Primary Care Provider: Lavon Paganini Other Clinician: Referring Provider: Lavon Paganini Treating Provider/Extender: Skipper Cliche in Treatment: 9 Subjective Chief Complaint Information obtained from Patient Reopened/Abscessed Surgical Wound on the Lower Abdominal Wall History of Present Illness (HPI) The following HPI elements were documented for the patient's wound: Location: left lower extremity Severity: improved since her initial hospitalization but stable since Duration: 6 weeks Context: began after her third treatment of Remicade for rheumatoid arthritis Modifying Factors: morbid obesity, chronic venous hypertension, lymphedema, diabetes mellitus type 2, methotrexate use, Remicade use, Associated Signs and Symptoms: pain and swelling left lower extremity she was seen last week where a 2 layer light compression system was applied. She removed that on Sunday because of itching. She states that she did see her primary care physician who has prescribed her a stronger diuretic and she will begin that today. When she removed her dressing she applied some Neosporin and some gauze. There remains considerable concern about her ability to keep the wrap in place for a week she has been prescribed lymphedema pumps but she does not use those either. We will encourage her to use those twice a day for an hour each time. These were prescribed to her by Dr. Hinton Lovely READMISSION 01/04/17;  this is a patient who is a type II diabetic on oral agents. I note she was seen in this clinic 2-3 years ago. I was not involved in her care at that point. She tells [name] that she is had ulcers on her lateral left lower extremity since just before Christmas. There was no obvious precipitant to this. She apparently has been on long-term suppressive penicillin prescribed by Dr. 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 She has noted increasing erythema along with all of this and I think this is what prompted her to come in today. She does not have a known history of PAD or neuropathy ABI in this clinic was 1.06 on the left. The patient has not been requiring any compression on her legs although she does have compression stockings at home as well as external compression pumps. She has been using neither of these. She has not been systemically unwell the patient is also known to the local vein and vascular group. Apparently her external compression pumps were previously prescribed by Dr. Delana Meyer. She has had apparent bilateral ablations done in the past although I have none of these records. 01/15/17; the patient returns for rewrap of her left leg last week and reports that was put on too tight, she had to remove it on Sunday. She states this actually occurred her even though the previous 3 lateral wrap she was able to tolerate well. I don't think there is been much in the way of  expansion of the erythema on the lateral left leg lateral left heel and lateral left foot. As mentioned previously I think this is chronic venous insufficiency and not really an active cellulitis. She has 2 small open areas in the lower left calf 01/22/17; the patient complains of really generalized pain. She has rheumatoid arthritis as well as fibromyalgia. She had to take the wrap off yesterday. She has small open areas on the  lateral left heel and foot. 02/01/2017 -- the patient is seen by me today as she's had compression wraps for 10 days and I understand she did not realize she had to come back to see [name]. She did see Dr. Delana Meyer yesterday and his notes are not in, but the patient tells [name] that he is going to recommend a lymphedema pump and has not recommended any further venous workup or intervention. 02/06/17; the patient went to see Dr. Hinton Lovely of vascular surgery although I don't remember specifically being involved with this discussion. He felt she had chronic venous insufficiency without surgery or intervention necessary at this time. Also noted lymphedema and type 2 diabetes. In the meantime she did not tolerate the wrap on the right leg stating that the small wound anteriorly" burns like fire" 02/13/17; the lesions on the left lateral and left medial leg have closed over. Still looks a little vulnerable on the lateral aspect on the left Right; still an open area here but smaller and appears to be well granulated. Using Silver Collegen. She uses her own wrap on the right leg last week and I think week and continue that on both legs this week 02/26/17; the lesion on the left lateral leg and left medial leg are both closed over. Still to open areas on the right medial leg. She is been using her own stockings and the edema control looks adequate 03/12/17 she has no open areas on the left lateral or left medial leg. She still has an open area on the right medial leg and last week we do find an area over the right medial malleolus. She is been to see vascular surgery who did not recommend any further venous workup. She has lymphedema pumps which she uses although edema doesn't appear to be a big part of her current nonhealing state. She has been using Prisma Readmission: 05-07-2022 upon evaluation today patient presents for initial inspection here in our clinic concerning a surgical wound over the lower abdominal area.  She had a panniculectomy which was performed on 02-05-2022 by Dr. Audelia Hives. Initially the surgery with Dr. Marla Roe was successful seemingly until the patient was seen initially on 03-02-2022 for ED to hospital admission due to cellulitis in this area. She subsequently had surgery which was irrigation and debridement with wound VAC placement that was on 03-04-2022. Unfortunately the wound VAC was never really significantly successful and the patient had a second hospital admission on 03-13-2022. She has had visit to the ER since on 04-05-2022. Eventually she decided to put in a referral herself to come in for further evaluation here at the wound center. She is having a hard time getting this to heal and to be honest wanted some help. With that being said she is now outside of her 90-day postop global period which is great news. All things being said I do think this is good to be a fairly complicated process to heal due to the fact that there are some significant tunnel areas at 3:00 and 9:00 along with the depth straight  in. The patient is aware that this is probably get a take quite a bit of time but nonetheless does want help in doing the best thing possible to get it to heal as quickly as possible and I think that we can definitely help with that. Conley, Shane D. ([id number]) Patient does have a history of diabetes her most recent hemoglobin A1c was 7.4 and that was on 03-03-2022. This was a little higher than her previous 2 readings at 3 and 6 months which were at 6.8. Otherwise the patient does have a history of hypertension as well as congestive heart failure as well. She seems to be doing okay in regard to those issues. 05-14-2022 upon evaluation today patient appears to be doing well currently in regard to her wound although she has not had the [facility] this was not used by home health they said they could not find her on the formulary. I know we have had patients in the past with [facility]  did use Hydrofera Blue however. Nonetheless I think that we already need to go that way she states that it seemed to do much better than the alginate which just does not catch and is much of the drainage at this point. Overall I do not see any signs of active infection locally or systemically at this time. 05-21-2022 upon evaluation today patient appears to be doing well currently in regard to her wound. I am actually very pleased with where things stand I think the Hydrofera Blue blue is doing a pretty good job. With that being said I do think that she is going to need to have the [facility] ready transfer not just the ready at this point. It has a backing on it the wound is being used and has not really can help her significantly at this point. 05-28-2022 upon evaluation today patient appears to be doing well currently in regard to her wound this is definitely showing signs of improvement I did review her culture as well and she is on Cipro which is the appropriate medication she did indeed have a Pseudomonas infection which is what we presume last week as well. 06-18-2022 upon evaluation today patient appears resents for reevaluation in the clinic she is actually doing much better. The size of the wound is improved but more importantly the undermining area specifically the tunneling that went towards the left side of her body has pretty much completely filled in. This is great news and I do not see any signs of active infection locally or systemically at this time which is excellent. 7/24; continued improvement in the wound in the right lower abdomen. Using Hydrofera Blue, nystatin powder and an ABD pad. The patient has a home health nurse changing the dressing 07-09-2022 upon evaluation today patient appears to be doing well with regard to her wound which is actually showing signs of being smaller and not nearly as deep as what it was. Fortunately I do think we are on the right track here and  this is very close to complete resolution in my opinion it is healing with a divot which makes a little bit more difficult to actually measure depth on but nonetheless this looks awesome. Objective Constitutional Well-nourished and well-hydrated in no acute distress. Vitals Time Taken: 11:13 AM, Height: 67 in, Weight: 230 lbs, BMI: 36, Temperature: 98.2 F, Pulse: 72 bpm, Respiratory Rate: 18 breaths/min, Blood Pressure: 105/68 mmHg. Respiratory normal breathing without difficulty. Psychiatric this patient is able to make decisions and  demonstrates good insight into disease process. Alert and Oriented x 3. pleasant and cooperative. General Notes: Upon inspection patient's wound bed actually showed signs of good granulation and epithelization at this point. Fortunately there does not appear to be any evidence of active infection locally or systemically which is great news and overall I am extremely pleased with where things stand. Integumentary (Hair, Skin) Wound #9 status is Open. Original cause of wound was Gradually Appeared. The date acquired was: 01/24/2022. The wound has been in treatment 9 weeks. The wound is located on the Right Abdomen - Lower Quadrant. The wound measures 0.5cm length x 2cm width x 0.5cm depth; 0.785cm^2 area and 0.393cm^3 volume. There is Fat Layer (Subcutaneous Tissue) exposed. There is no tunneling or undermining noted. There is a medium amount of serosanguineous drainage noted. There is large (67-100%) red granulation within the wound bed. There is no necrotic tissue within the wound bed. 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 ([id number]) Type 2 diabetes mellitus with  other skin ulcer Essential (primary) hypertension Plan Follow-up Appointments: Return Appointment in 2 weeks. Home Health: [facility] for wound care. May utilize formulary equivalent dressing for wound treatment orders unless otherwise specified. Home Health Nurse may visit PRN to address patient s wound care needs. Jackquline Denmark [phone] **Please direct any NON-WOUND related issues/requests for orders to patient's Primary Care Physician. **If current dressing causes regression in wound condition, may D/C ordered dressing product/s and apply Normal Saline Moist Dressing daily until next [place] or Other MD appointment. **Notify [facility] of regression in wound condition at [phone]. Bathing/ Shower/ Hygiene: May shower; gently cleanse wound with antibacterial soap, rinse and pat dry prior to dressing wounds - on days dressing changed WOUND #9: - Abdomen - Lower Quadrant Wound Laterality: Right Topical: Nystop Nystatin Powder, 15 (g) bottle 3 x Per Week/30 Days Primary Dressing: Hydrofera Blue Ready Transfer Foam, 4x5 (in/in) 3 x Per Week/30 Days Discharge Instructions: PACK LIGHTLY Secondary Dressing: Gauze 3 x Per Week/30 Days Discharge Instructions: As directed: dry, moistened with saline or moistened with Dakins Solution Secured With: Medipore Tape - 9M Medipore H Soft Cloth Surgical Tape, 2x2 (in/yd) 3 x Per Week/30 Days 1. I am going to suggest that we go ahead and continue with the wound care measures as before and the patient is in agreement with plan. This includes the use of the nystatin powder along with the [facility] that we have been utilizing the nystatin powder just as needed. With that being said I do think that continue with the gauze to cover and tape to secure in place is appropriate. 2. I would recommend she continue to monitor for any signs of worsening or infection if anything changes she should let me know. Otherwise we will  see how things appear next week. We will see patient back for reevaluation in 2 weeks here in the clinic. If anything worsens or changes patient will contact our office for additional recommendations. Electronic Signature(s) Signed: 07/09/2022 11:47:00 AM By: Worthy Keeler PA-C Entered By: Worthy Keeler on 07/09/2022 11:47:00 Seiber, Siona D. ([id number]) -------------------------------------------------------------------------------- SuperBill Details Patient Name: Oxley, [name] Date of Service: 07/09/2022 Medical Record Number: [id number] Patient Account Number: [id number] Date of Birth/Sex: 1951-01-12 (71 y.o. F) Treating  RN: Carlene Coria Primary Care Provider: Lavon Paganini Other Clinician: Referring Provider: Lavon Paganini Treating Provider/Extender: Skipper Cliche in Treatment: 9 Diagnosis Coding ICD-10 Codes Code Description T81.31XA Disruption of external operation (surgical) wound, not elsewhere classified, initial encounter L02.211 Cutaneous abscess of abdominal wall L98.492 Non-pressure chronic ulcer of skin of other sites with fat layer exposed I50.42 Chronic combined systolic (congestive) and diastolic (congestive) heart failure E11.622 Type 2 diabetes mellitus with other skin ulcer I10 Essential (primary) hypertension Facility Procedures CPT4 Code: 16109604 Description: [phone] - WOUND CARE VISIT-LEV 2 EST PT Modifier: Quantity: 1 Physician Procedures CPT4 Code: 1191478 Description: 99213 - WC PHYS LEVEL 3 - EST PT Modifier: Quantity: 1 CPT4 Code: Description: ICD-10 Diagnosis Description T81.31XA Disruption of external operation (surgical) wound, not elsewhere classifi L02.211 Cutaneous abscess of abdominal wall L98.492 Non-pressure chronic ulcer of skin of other sites with fat layer exposed  I50.42 Chronic combined systolic (congestive) and diastolic (congestive) heart f Modifier: ed, initial encounter ailure Quantity: Electronic  Signature(s) Signed: 07/09/2022 11:47:15 AM By: Worthy Keeler PA-C Entered By: Worthy Keeler on 07/09/2022 11:47:14

## 2022-07-09 NOTE — Progress Notes (Addendum)
[name] [name], [name] [name] ([id number]) Visit Report for 07/09/2022 Arrival Information Details Patient Name: [name] [name], [name] Date of Service: 07/09/2022 11:00 AM Medical Record Number: [id number] Patient Account Number: [id number] Date of Birth/Sex: [birth year] (71 y.o. F) Treating RN: Carlene Coria Primary Care Jermesha Sottile: Lavon Paganini Other Clinician: Referring Iva Montelongo: Lavon Paganini Treating Tahjae Durr/Extender: Skipper Cliche in Treatment: 9 Visit Information History Since Last Visit All ordered tests and consults were completed: No Patient Arrived: Ambulatory Added or deleted any medications: No Arrival Time: 11:07 Any new allergies or adverse reactions: No Accompanied By: self Had a fall or experienced change in No Transfer Assistance: None activities of daily living that may affect Patient Identification Verified: Yes risk of falls: Secondary Verification Process Completed: Yes Signs or symptoms of abuse/neglect since last visito No Patient Requires Transmission-Based Precautions: No Hospitalized since last visit: No Patient Has Alerts: Yes Implantable device outside of the clinic excluding No Patient Alerts: DIABETIC cellular tissue based products placed in the center since last visit: Has Dressing in Place as Prescribed: Yes Pain Present Now: No Electronic Signature(s) Signed: 07/12/2022 12:35:42 PM By: Carlene Coria RN Entered By: Carlene Coria on 07/09/2022 11:12:17 [name] [name], [name] ([id number]) -------------------------------------------------------------------------------- Clinic Level of Care Assessment Details Patient Name: [name], [name] Date of Service: 07/09/2022 11:00 AM Medical Record Number: [id number] Patient Account Number: [id number] Date of Birth/Sex: 1951-01-10 (71 y.o. F) Treating RN: Carlene Coria Primary Care Kacy Conely: Lavon Paganini Other Clinician: Referring Persephone Schriever: Lavon Paganini Treating Thelma Lorenzetti/Extender: Skipper Cliche in Treatment:  9 Clinic Level of Care Assessment Items TOOL 4 Quantity Score X - Use when only an EandM is performed on FOLLOW-UP visit 1 0 ASSESSMENTS - Nursing Assessment / Reassessment '[]'$  - Reassessment of Co-morbidities (includes updates in patient status) 0 '[]'$  - 0 Reassessment of Adherence to Treatment Plan ASSESSMENTS - Wound and Skin Assessment / Reassessment X - Simple Wound Assessment / Reassessment - one wound 1 5 '[]'$  - 0 Complex Wound Assessment / Reassessment - multiple wounds '[]'$  - 0 Dermatologic / Skin Assessment (not related to wound area) ASSESSMENTS - Focused Assessment '[]'$  - Circumferential Edema Measurements - multi extremities 0 '[]'$  - 0 Nutritional Assessment / Counseling / Intervention '[]'$  - 0 Lower Extremity Assessment (monofilament, tuning fork, pulses) '[]'$  - 0 Peripheral Arterial Disease Assessment (using hand held doppler) ASSESSMENTS - Ostomy and/or Continence Assessment and Care '[]'$  - Incontinence Assessment and Management 0 '[]'$  - 0 Ostomy Care Assessment and Management (repouching, etc.) PROCESS - Coordination of Care X - Simple Patient / Family Education for ongoing care 1 15 '[]'$  - 0 Complex (extensive) Patient / Family Education for ongoing care '[]'$  - 0 Staff obtains Programmer, systems, Records, Test Results / Process Orders '[]'$  - 0 Staff telephones HHA, Nursing Homes / Clarify orders / etc '[]'$  - 0 Routine Transfer to another Facility (non-emergent condition) '[]'$  - 0 Routine Hospital Admission (non-emergent condition) '[]'$  - 0 New Admissions / Biomedical engineer / Ordering NPWT, Apligraf, etc. '[]'$  - 0 Emergency Hospital Admission (emergent condition) X- 1 10 Simple Discharge Coordination '[]'$  - 0 Complex (extensive) Discharge Coordination PROCESS - Special Needs '[]'$  - Pediatric / Minor Patient Management 0 '[]'$  - 0 Isolation Patient Management '[]'$  - 0 Hearing / Language / Visual special needs '[]'$  - 0 Assessment of Community assistance (transportation, D/C planning, etc.) '[]'$   - 0 Additional assistance / Altered mentation '[]'$  - 0 Support Surface(s) Assessment (bed, cushion, seat, etc.) INTERVENTIONS - Wound Cleansing / Measurement [name], [name] ([id number]) X- 1 5 Simple Wound  Cleansing - one wound '[]'$  - 0 Complex Wound Cleansing - multiple wounds X- 1 5 Wound Imaging (photographs - any number of wounds) '[]'$  - 0 Wound Tracing (instead of photographs) X- 1 5 Simple Wound Measurement - one wound '[]'$  - 0 Complex Wound Measurement - multiple wounds INTERVENTIONS - Wound Dressings X - Small Wound Dressing one or multiple wounds 1 10 '[]'$  - 0 Medium Wound Dressing one or multiple wounds '[]'$  - 0 Large Wound Dressing one or multiple wounds '[]'$  - 0 Application of Medications - topical '[]'$  - 0 Application of Medications - injection INTERVENTIONS - Miscellaneous '[]'$  - External ear exam 0 '[]'$  - 0 Specimen Collection (cultures, biopsies, blood, body fluids, etc.) '[]'$  - 0 Specimen(s) / Culture(s) sent or taken to Lab for analysis '[]'$  - 0 Patient Transfer (multiple staff / Civil Service fast streamer / Similar devices) '[]'$  - 0 Simple Staple / Suture removal (25 or less) '[]'$  - 0 Complex Staple / Suture removal (26 or more) '[]'$  - 0 Hypo / Hyperglycemic Management (close monitor of Blood Glucose) '[]'$  - 0 Ankle / Brachial Index (ABI) - do not check if billed separately X- 1 5 Vital Signs Has the patient been seen at the hospital within the last three years: Yes Total Score: 60 Level Of Care: New/Established - Level 2 Electronic Signature(s) Signed: 07/12/2022 12:35:42 PM By: Carlene Coria RN Entered By: Carlene Coria on 07/09/2022 11:39:46 [name] [name], [name] ([id number]) -------------------------------------------------------------------------------- Encounter Discharge Information Details Patient Name: [name], [name] Date of Service: 07/09/2022 11:00 AM Medical Record Number: [id number] Patient Account Number: [id number] Date of Birth/Sex: 1951/01/23 (71 y.o. F) Treating RN: Carlene Coria Primary Care Teola Felipe: Lavon Paganini Other Clinician: Referring Aliza Moret: Lavon Paganini Treating Taye Cato/Extender: Skipper Cliche in Treatment: 9 Encounter Discharge Information Items Discharge Condition: Stable Ambulatory Status: Ambulatory Discharge Destination: Home Transportation: Private Auto Accompanied By: self Schedule Follow-up Appointment: Yes Clinical Summary of Care: Electronic Signature(s) Signed: 07/12/2022 12:35:42 PM By: Carlene Coria RN Entered By: Carlene Coria on 07/09/2022 12:34:44 [name] [name], [name] ([id number]) -------------------------------------------------------------------------------- Lower Extremity Assessment Details Patient Name: Rankin, Lily D. Date of Service: 07/09/2022 11:00 AM Medical Record Number: [id number] Patient Account Number: [id number] Date of Birth/Sex: [birth year] (71 y.o. F) Treating RN: Carlene Coria Primary Care Shantoya Geurts: Lavon Paganini Other Clinician: Referring Felicite Zeimet: Lavon Paganini Treating Gehrig Patras/Extender: Skipper Cliche in Treatment: 9 Electronic Signature(s) Signed: 07/12/2022 12:35:42 PM By: Carlene Coria RN Entered By: Carlene Coria on 07/09/2022 11:19:07 Tooley, [name] ([id number]) -------------------------------------------------------------------------------- Multi Wound Chart Details Patient Name: [name] [name], [name] Date of Service: 07/09/2022 11:00 AM Medical Record Number: [id number] Patient Account Number: [id number] Date of Birth/Sex: [birth year] (71 y.o. F) Treating RN: Carlene Coria Primary Care Liley Rake: Lavon Paganini Other Clinician: Referring Kalab Camps: Lavon Paganini Treating Burney Calzadilla/Extender: Skipper Cliche in Treatment: 9 Vital Signs Height(in): 67 Pulse(bpm): 72 Weight(lbs): 230 Blood Pressure(mmHg): 105/68 Body Mass Index(BMI): 36 Temperature(F): 98.2 Respiratory Rate(breaths/min): 18 Photos: [N/A:N/A] Wound Location: Right Abdomen - Lower Quadrant N/A  N/A Wounding Event: Gradually Appeared N/A N/A Primary Etiology: Abscess N/A N/A Comorbid History: Lymphedema, Congestive Heart N/A N/A Failure, Coronary Artery Disease, Hypertension, Type II Diabetes, Rheumatoid Arthritis, Osteoarthritis, Confinement Anxiety Date Acquired: 01/24/2022 N/A N/A Weeks of Treatment: 9 N/A N/A Wound Status: Open N/A N/A Wound Recurrence: No N/A N/A Measurements L x W x D (cm) 0.5x2x0.5 N/A N/A Area (cm) : 0.785 N/A N/A Volume (cm) : 0.393 N/A N/A % Reduction in Area: 33.40% N/A N/A % Reduction in Volume: 84.80% N/A N/A Classification: Full  Thickness Without Exposed N/A N/A Support Structures Exudate Amount: Medium N/A N/A Exudate Type: Serosanguineous N/A N/A Exudate Color: red, brown N/A N/A Granulation Amount: Large (67-100%) N/A N/A Granulation Quality: Red N/A N/A Necrotic Amount: None Present (0%) N/A N/A Exposed Structures: Fat Layer (Subcutaneous Tissue): N/A N/A Yes Fascia: No Tendon: No Muscle: No Joint: No Bone: No Epithelialization: None N/A N/A Treatment Notes Electronic Signature(s) Signed: 07/12/2022 12:35:42 PM By: Carlene Coria RN Entered By: Carlene Coria on 07/09/2022 11:19:19 [place]water, Tyasia D. ([id number]) Sakuma, Naomie D. ([id number]) -------------------------------------------------------------------------------- [place] Details Patient Name: [name] [name], [name] Date of Service: 07/09/2022 11:00 AM Medical Record Number: [id number] Patient Account Number: [id number] Date of Birth/Sex: June 29, 1951 (71 y.o. F) Treating RN: Carlene Coria Primary Care Beyonce Sawatzky: Lavon Paganini Other Clinician: Referring Ronen Bromwell: Lavon Paganini Treating Valente Fosberg/Extender: Skipper Cliche in Treatment: 9 Active Inactive Electronic Signature(s) Signed: 08/14/2022 5:34:08 PM By: Gretta Cool BSN, RN, CWS, Kim RN, BSN Signed: 08/20/2022 2:32:45 PM By: Carlene Coria RN Previous Signature: 07/12/2022 12:35:42 PM Version By: Carlene Coria RN Entered By: Gretta Cool BSN, RN, CWS, Kim on 08/14/2022 17:34:08 [name] [name], [name] ([id number]) -------------------------------------------------------------------------------- Pain Assessment Details Patient Name: Micucci, Ifeoma D. Date of Service: 07/09/2022 11:00 AM Medical Record Number: [id number] Patient Account Number: [id number] Date of Birth/Sex: 11/22/1951 (71 y.o. F) Treating RN: Carlene Coria Primary Care Laurina Fischl: Lavon Paganini Other Clinician: Referring Brithney Bensen: Lavon Paganini Treating Sulo Janczak/Extender: Skipper Cliche in Treatment: 9 Active Problems Location of Pain Severity and Description of Pain Patient Has Paino No Site Locations Pain Management and Medication Current Pain Management: Electronic Signature(s) Signed: 07/12/2022 12:35:42 PM By: Carlene Coria RN Entered By: Carlene Coria on 07/09/2022 11:14:13 [name] [name], [name] ([id number]) -------------------------------------------------------------------------------- Patient/Caregiver Education Details Patient Name: Wank, Sharnetta D. Date of Service: 07/09/2022 11:00 AM Medical Record Number: [id number] Patient Account Number: [id number] Date of Birth/Gender: 1951-02-11 (71 y.o. F) Treating RN: Carlene Coria Primary Care Physician: Lavon Paganini Other Clinician: Referring Physician: Lavon Paganini Treating Physician/Extender: Skipper Cliche in Treatment: 9 Education Assessment Education Provided To: Patient Education Topics Provided Wound/Skin Impairment: Methods: Explain/Verbal Responses: State content correctly Electronic Signature(s) Signed: 07/12/2022 12:35:42 PM By: Carlene Coria RN Entered By: Carlene Coria on 07/09/2022 11:40:07 [name] [name], [name] ([id number]) -------------------------------------------------------------------------------- Wound Assessment Details Patient Name: [name] [name], [name] Date of Service: 07/09/2022 11:00 AM Medical Record Number: [id number] Patient Account Number:  [id number] Date of Birth/Sex: 05-05-1951 (71 y.o. F) Treating RN: Cornell Barman Primary Care Suan Pyeatt: Lavon Paganini Other Clinician: Referring Izza Bickle: Lavon Paganini Treating Mahlon Gabrielle/Extender: Skipper Cliche in Treatment: 9 Wound Status Wound Number: 9 Primary Abscess Etiology: Wound Location: Right Abdomen - Lower Quadrant Wound Healed - Epithelialized Wounding Event: Gradually Appeared Status: Date Acquired: 01/24/2022 Comorbid Lymphedema, Congestive Heart Failure, Coronary Artery Weeks Of Treatment: 9 History: Disease, Hypertension, Type II Diabetes, Rheumatoid Clustered Wound: No Arthritis, Osteoarthritis, Confinement Anxiety Photos Wound Measurements Length: (cm) 0 Width: (cm) 0 Depth: (cm) 0 Area: (cm) Volume: (cm) % Reduction in Area: 33.4% % Reduction in Volume: 84.8% Epithelialization: None 0 Tunneling: No 0 Undermining: No Wound Description Classification: Full Thickness Without Exposed Support Structu Exudate Amount: Medium Exudate Type: Serosanguineous Exudate Color: red, brown res Foul Odor After Cleansing: No Slough/Fibrino No Wound Bed Granulation Amount: Large (67-100%) Exposed Structure Granulation Quality: Red Fascia Exposed: No Necrotic Amount: None Present (0%) Fat Layer (Subcutaneous Tissue) Exposed: Yes Tendon Exposed: No Muscle Exposed: No Joint Exposed: No Bone Exposed: No Treatment Notes Wound #9 (Abdomen - Lower Quadrant) Wound Laterality: Right Cleanser Peri-Wound Care  Topical Nystop Nystatin Powder, 15 (g) bottle [name] [name], Bradley D. ([id number]) Primary Dressing Hydrofera Blue Ready Transfer Foam, 4x5 (in/in) Discharge Instruction: PACK LIGHTLY Secondary Dressing Gauze Discharge Instruction: As directed: dry, moistened with saline or moistened with Dakins Solution Secured With Medipore Tape - 72M Medipore H Soft Cloth Surgical Tape, 2x2 (in/yd) Compression Wrap Compression Stockings Add-Ons Electronic  Signature(s) Signed: 08/27/2022 8:06:07 AM By: Gretta Cool, BSN, RN, CWS, Kim RN, BSN Previous Signature: 07/12/2022 12:35:42 PM Version By: Carlene Coria RN Entered By: Gretta Cool, BSN, RN, CWS, Kim on 08/14/2022 17:33:12 Sheperd, Antonetta D. ([id number]) -------------------------------------------------------------------------------- Vitals Details Patient Name: Mcglynn, Britnay D. Date of Service: 07/09/2022 11:00 AM Medical Record Number: [id number] Patient Account Number: [id number] Date of Birth/Sex: 01/02/1951 (71 y.o. F) Treating RN: Carlene Coria Primary Care Jaiquan Temme: Lavon Paganini Other Clinician: Referring [place]: Lavon Paganini Treating Udell Mazzocco/Extender: Skipper Cliche in Treatment: 9 Vital Signs Time Taken: 11:13 Temperature (F): 98.2 Height (in): 67 Pulse (bpm): 72 Weight (lbs): 230 Respiratory Rate (breaths/min): 18 Body Mass Index (BMI): 36 Blood Pressure (mmHg): 105/68 Reference Range: 80 - 120 mg / dl Electronic Signature(s) Signed: 07/12/2022 12:35:42 PM By: Carlene Coria RN Entered By: Carlene Coria on 07/09/2022 11:13:54

## 2022-07-10 ENCOUNTER — Ambulatory Visit: Payer: Medicare Other | Admitting: *Deleted

## 2022-07-10 DIAGNOSIS — F332 Major depressive disorder, recurrent severe without psychotic features: Secondary | ICD-10-CM

## 2022-07-10 DIAGNOSIS — E119 Type 2 diabetes mellitus without complications: Secondary | ICD-10-CM

## 2022-07-10 DIAGNOSIS — Z5986 Financial insecurity: Secondary | ICD-10-CM

## 2022-07-10 NOTE — Telephone Encounter (Signed)
Patient denies any symptoms today. She refused to schedule appt for today. She did not go to [facility] yesterday.

## 2022-07-10 NOTE — Chronic Care Management (AMB) (Signed)
Chronic Care Management    Clinical Social Work Note  07/10/2022 Name: [name] [name] MRN: [id number] DOB: 1951-08-15  [name] [name] is a 71 y.o. year old female who is a primary care patient of Bacigalupo, Dionne Bucy, MD. The CCM team was consulted to assist the patient with chronic disease management and/or care coordination needs related to: [employer]  and [place] and Resources.   Engaged with patient by telephone for follow up visit in response to provider referral for social work chronic care management and care coordination services.   Consent to Services:  The patient was given information about Chronic Care Management services, agreed to services, and gave verbal consent prior to initiation of services.  Please see initial visit note for detailed documentation.   Patient agreed to services and consent obtained.   Assessment: Review of patient past medical history, allergies, medications, and health status, including review of relevant consultants reports was performed today as part of a comprehensive evaluation and provision of chronic care management and care coordination services.     SDOH (Social Determinants of Health) assessments and interventions performed:    Advanced Directives Status: Not addressed in this encounter.  CCM Care Plan  Allergies  Allergen Reactions   Sulfa Antibiotics Itching, Rash and Swelling   Latex Itching    Other reaction(s): angioedema   Doxycycline Rash   Empagliflozin Itching and Other (See Comments)    Itching and frequent yeast infections    Outpatient Encounter Medications as of 07/10/2022  Medication Sig Note   Biotin w/ Vitamins C & E (HAIR/SKIN/NAILS PO) Take 1 tablet by mouth daily.    buPROPion 450 MG TB24 Take 450 mg by mouth daily.    carvedilol (COREG) 6.25 MG tablet TAKE ONE TABLET BY MOUTH TWICE DAILY    DULoxetine (CYMBALTA) 60 MG capsule NEW PRESCRIPTION REQUEST: TAKE ONE CAPSULE BY MOUTH DAILY     ezetimibe (ZETIA) 10 MG tablet TAKE 1 TABLET BY MOUTH EVERY DAY (Patient taking differently: Take 10 mg by mouth daily.)    GEMTESA 75 MG TABS Take 1 tablet by mouth daily.    losartan (COZAAR) 25 MG tablet TAKE 1 TABLET (25 MG TOTAL) BY MOUTH DAILY.    meloxicam (MOBIC) 15 MG tablet TAKE 1 TABLET (15 MG TOTAL) BY MOUTH DAILY.    metFORMIN (GLUCOPHAGE) 500 MG tablet NEW PRESCRIPTION REQUEST: TAKE ONE TABLET BY MOUTH TWICE DAILY    polyethylene glycol (MIRALAX / GLYCOLAX) 17 g packet Take 17 g by mouth daily as needed for mild constipation.    pregabalin (LYRICA) 75 MG capsule TAKE ONE CAPSULE BY MOUTH THREE TIMES DAILY    tolterodine (DETROL LA) 4 MG 24 hr capsule Take 4 mg by mouth daily. 03/14/2022: Pt states this replaced Gemtasa, per [facility] last fill date 08/07/2021 #90 for 90 day supply   No facility-administered encounter medications on file as of 07/10/2022.    Patient Active Problem List   Diagnosis Date Noted   Passive suicidal ideations 05/31/2022   Financial insecurity 05/31/2022   Wound infection 03/13/2022   Normocytic anemia 03/03/2022   Abdominal wall cellulitis 03/03/2022   Wound dehiscence 03/03/2022   Stricture esophagus 02/21/2022   Hyponatremia 02/15/2022   Hypoalbuminemia 02/15/2022   Post op infection 02/15/2022    Class: Question of   Anemia associated with acute blood loss 02/15/2022   Hypokalemia 02/15/2022   Right arm pain 10/19/2021   Housing instability 03/28/2021   Dermatochalasis of both upper eyelids  03/23/2021   Exposure keratopathy, 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   Ptosis of both eyebrows 03/23/2021   Ptosis of both upper eyelids 03/23/2021   Senile ectropion of both lower eyelids 03/23/2021   Senile purpura ([place]) 03/07/2021   Panniculitis 02/03/2021   Myalgia due to statin 12/26/2020   Telogen effluvium 12/26/2020   Candidal intertrigo 12/06/2020   Microcytic anemia 12/06/2020   Polyneuropathy associated with underlying disease ([place]) 02/03/2020    Lump in chest 12/28/2019   Mixed stress and urge urinary incontinence 08/21/2019   Avitaminosis D 07/15/2019   Drug-induced constipation 06/12/2019   Lumbar spondylosis 07/08/2018   Chronic bilateral low back pain with bilateral sciatica 07/08/2018   Chronic pain syndrome 07/08/2018   Urinary incontinence 03/19/2018   Complex endometrial hyperplasia with atypia 09/03/2017   Atypical endometrial hyperplasia 08/15/2017   Bilateral shoulder pain 07/18/2017   Lymphedema 01/31/2017   Hyperlipidemia associated with type 2 diabetes mellitus ([place]) 12/14/2016   Hypertension associated with diabetes ([place]) 40/98/1191   Chronic systolic CHF (congestive heart failure), NYHA class 3 ([place]) 10/06/2015   History of MI (myocardial infarction) 09/20/2015   Allergic rhinitis 07/06/2015   Chronic venous insufficiency 07/06/2015   Atherosclerosis of coronary artery 07/06/2015   MDD (major depressive disorder) 07/06/2015   Fibromyalgia 07/06/2015   Rheumatoid arthritis involving multiple sites with positive rheumatoid factor ([place]) 07/06/2015   Type 2 diabetes mellitus with complication, without long-term current use of insulin ([place]) 04/08/2014   Migraines 04/08/2014   Gastroduodenal ulcer 04/08/2014   Venous stasis 04/08/2014   Osteoarthritis 04/08/2014   Coronary artery disease 04/08/2014   Peptic ulcer disease 04/08/2014   Obesity 04/08/2014   Seropositive rheumatoid arthritis ([place]) 04/06/2014    Conditions to be addressed/monitored:  Anxiety and Depression; Financial constraints related to decrease in income and Mental Health Concerns   Care Plan : General Social Work (Adult)  Updates made by [employer], Darla Lesches, LCSW since 07/10/2022 12:00 AM     Problem: CHL AMB "PATIENT-SPECIFIC PROBLEM"   Note:   CARE PLAN ENTRY (see longitudinal plan of care for additional care plan information)  Current Barriers:  Patient with Anxiety and Depression in need of assistance with connection to community  resources  Knowledge deficits and need for support, education and care coordination related to community resources support  Financial constraints related to decrease in income  Patients states that money is now being deducted from her social security due to a overpayment. Patient now in financial difficulty and having trouble taking care of her monthly financial needs. Patient also has history of depression and anxiety  Clinical Goal(s)  Over the next 90 days, patient will work with care management team member to address concerns related to financial strain  Interventions provided by LCSW:  Assessed patient's care coordination needs related to financial needs and discussed ongoing care management follow up  Continued to discuss options to manage and adjust finances including requesting assistance from family and friends, concentrating on essentials, utilizing transportation benefit and catching up on utility bill with monthly financial assistance received through insurance company Confirmed that patient has received information mailed to her home related to food banks, transportation, Research scientist (life sciences) counseling and senior housing mailed to her home Patient confirmed that she has applied for senior housing as well as Section 8 and has been placed on the wait list.  Patient also considering moving in to a mobile home park near her family Patient confirmed that she has contacted the Department of Social Services for assistance with her  utility bills, however at the time of the call they were out of funds Advised patient to contact her insurance company to arrange transport to her next MD appointment if needed Collaborated with appropriate clinical care team members regarding patient needs Continued to provide emotional support to patient related to her financial concerns, followed up on mental health counseling referral due to her history of depression and anxiety, confirmed that second referral placed  for [place], contact number previously provided for follow up on status of appointment Referral sent to care guide as well for resources to assist with utility bills Motivational Interviewing employed PHQ2/ PHQ9 completed Solution-Focused Strategies employed:  Active listening / Reflection utilized  Emotional Support Provided    Patient Self Care Activities & Deficits:  Patient is unable to independently navigate community resource options without care coordination support  Acknowledges deficits and is motivated to resolve concern  Patient is able to contact   as discussed today Unable to perform ADLs independently Ability for insight Independent living Motivation for treatment Strong family or social support  Please see past updates related to this goal by clicking on the "Past Updates" button in the selected goal         Follow Up Plan: SW will follow up with patient by phone over the next 14 business days       [employer], [place] Worker  [place] Care Management [phone]

## 2022-07-10 NOTE — Patient Instructions (Signed)
Visit Information  Thank you for taking time to visit with me today. Please don't hesitate to contact me if I can be of assistance to you before our next scheduled telephone appointment.  Following are the goals we discussed today:  - begin a notebook of services in my neighborhood or community - call 211 when I need some help - follow-up on any referrals for help I am given - think ahead to make sure my need does not become an emergency - make a list of family or friends that I can call  Our next appointment is by telephone on 07/24/22 at 10am  Please call the care guide team at [phone] if you need to cancel or reschedule your appointment.   If you are experiencing a Mental Health or [place] or need someone to talk to, please call the Suicide and Crisis Lifeline: 988   Patient verbalizes understanding of instructions and care plan provided today and agrees to view in [place]. Active MyChart status and patient understanding of how to access instructions and care plan via MyChart confirmed with patient.     Telephone follow up appointment with care management team member scheduled for: 07/24/22   Elliot Gurney, [place] Worker  [facility] Care Management [phone]

## 2022-07-11 DIAGNOSIS — Z96652 Presence of left artificial knee joint: Secondary | ICD-10-CM | POA: Diagnosis not present

## 2022-07-11 DIAGNOSIS — I152 Hypertension secondary to endocrine disorders: Secondary | ICD-10-CM | POA: Diagnosis not present

## 2022-07-11 DIAGNOSIS — M069 Rheumatoid arthritis, unspecified: Secondary | ICD-10-CM | POA: Diagnosis not present

## 2022-07-11 DIAGNOSIS — F32A Depression, unspecified: Secondary | ICD-10-CM | POA: Diagnosis not present

## 2022-07-11 DIAGNOSIS — T8141XD Infection following a procedure, superficial incisional surgical site, subsequent encounter: Secondary | ICD-10-CM | POA: Diagnosis not present

## 2022-07-11 DIAGNOSIS — Z87891 Personal history of nicotine dependence: Secondary | ICD-10-CM | POA: Diagnosis not present

## 2022-07-11 DIAGNOSIS — I2510 Atherosclerotic heart disease of native coronary artery without angina pectoris: Secondary | ICD-10-CM | POA: Diagnosis not present

## 2022-07-11 DIAGNOSIS — M48061 Spinal stenosis, lumbar region without neurogenic claudication: Secondary | ICD-10-CM | POA: Diagnosis not present

## 2022-07-11 DIAGNOSIS — M1990 Unspecified osteoarthritis, unspecified site: Secondary | ICD-10-CM | POA: Diagnosis not present

## 2022-07-11 DIAGNOSIS — G8929 Other chronic pain: Secondary | ICD-10-CM | POA: Diagnosis not present

## 2022-07-11 DIAGNOSIS — E1159 Type 2 diabetes mellitus with other circulatory complications: Secondary | ICD-10-CM | POA: Diagnosis not present

## 2022-07-11 DIAGNOSIS — Z9181 History of falling: Secondary | ICD-10-CM | POA: Diagnosis not present

## 2022-07-11 DIAGNOSIS — I5022 Chronic systolic (congestive) heart failure: Secondary | ICD-10-CM | POA: Diagnosis not present

## 2022-07-11 DIAGNOSIS — I110 Hypertensive heart disease with heart failure: Secondary | ICD-10-CM | POA: Diagnosis not present

## 2022-07-11 DIAGNOSIS — M797 Fibromyalgia: Secondary | ICD-10-CM | POA: Diagnosis not present

## 2022-07-11 DIAGNOSIS — Z7984 Long term (current) use of oral hypoglycemic drugs: Secondary | ICD-10-CM | POA: Diagnosis not present

## 2022-07-11 DIAGNOSIS — E1151 Type 2 diabetes mellitus with diabetic peripheral angiopathy without gangrene: Secondary | ICD-10-CM | POA: Diagnosis not present

## 2022-07-11 DIAGNOSIS — I878 Other specified disorders of veins: Secondary | ICD-10-CM | POA: Diagnosis not present

## 2022-07-11 DIAGNOSIS — F40240 Claustrophobia: Secondary | ICD-10-CM | POA: Insufficient documentation

## 2022-07-11 DIAGNOSIS — I872 Venous insufficiency (chronic) (peripheral): Secondary | ICD-10-CM | POA: Diagnosis not present

## 2022-07-11 DIAGNOSIS — T8131XD Disruption of external operation (surgical) wound, not elsewhere classified, subsequent encounter: Secondary | ICD-10-CM | POA: Diagnosis not present

## 2022-07-11 DIAGNOSIS — E785 Hyperlipidemia, unspecified: Secondary | ICD-10-CM | POA: Diagnosis not present

## 2022-07-11 DIAGNOSIS — M545 Low back pain, unspecified: Secondary | ICD-10-CM | POA: Diagnosis not present

## 2022-07-12 ENCOUNTER — Other Ambulatory Visit: Payer: Self-pay | Admitting: Family Medicine

## 2022-07-13 DIAGNOSIS — E1151 Type 2 diabetes mellitus with diabetic peripheral angiopathy without gangrene: Secondary | ICD-10-CM | POA: Diagnosis not present

## 2022-07-13 DIAGNOSIS — Z87891 Personal history of nicotine dependence: Secondary | ICD-10-CM | POA: Diagnosis not present

## 2022-07-13 DIAGNOSIS — I152 Hypertension secondary to endocrine disorders: Secondary | ICD-10-CM | POA: Diagnosis not present

## 2022-07-13 DIAGNOSIS — T8141XD Infection following a procedure, superficial incisional surgical site, subsequent encounter: Secondary | ICD-10-CM | POA: Diagnosis not present

## 2022-07-13 DIAGNOSIS — Z7984 Long term (current) use of oral hypoglycemic drugs: Secondary | ICD-10-CM | POA: Diagnosis not present

## 2022-07-13 DIAGNOSIS — I872 Venous insufficiency (chronic) (peripheral): Secondary | ICD-10-CM | POA: Diagnosis not present

## 2022-07-13 DIAGNOSIS — Z96652 Presence of left artificial knee joint: Secondary | ICD-10-CM | POA: Diagnosis not present

## 2022-07-13 DIAGNOSIS — M1990 Unspecified osteoarthritis, unspecified site: Secondary | ICD-10-CM | POA: Diagnosis not present

## 2022-07-13 DIAGNOSIS — M48061 Spinal stenosis, lumbar region without neurogenic claudication: Secondary | ICD-10-CM | POA: Diagnosis not present

## 2022-07-13 DIAGNOSIS — I878 Other specified disorders of veins: Secondary | ICD-10-CM | POA: Diagnosis not present

## 2022-07-13 DIAGNOSIS — T8131XD Disruption of external operation (surgical) wound, not elsewhere classified, subsequent encounter: Secondary | ICD-10-CM | POA: Diagnosis not present

## 2022-07-13 DIAGNOSIS — G8929 Other chronic pain: Secondary | ICD-10-CM | POA: Diagnosis not present

## 2022-07-13 DIAGNOSIS — I110 Hypertensive heart disease with heart failure: Secondary | ICD-10-CM | POA: Diagnosis not present

## 2022-07-13 DIAGNOSIS — M069 Rheumatoid arthritis, unspecified: Secondary | ICD-10-CM | POA: Diagnosis not present

## 2022-07-13 DIAGNOSIS — E1159 Type 2 diabetes mellitus with other circulatory complications: Secondary | ICD-10-CM | POA: Diagnosis not present

## 2022-07-13 DIAGNOSIS — M545 Low back pain, unspecified: Secondary | ICD-10-CM | POA: Diagnosis not present

## 2022-07-13 DIAGNOSIS — M797 Fibromyalgia: Secondary | ICD-10-CM | POA: Diagnosis not present

## 2022-07-13 DIAGNOSIS — I2510 Atherosclerotic heart disease of native coronary artery without angina pectoris: Secondary | ICD-10-CM | POA: Diagnosis not present

## 2022-07-13 DIAGNOSIS — E785 Hyperlipidemia, unspecified: Secondary | ICD-10-CM | POA: Diagnosis not present

## 2022-07-13 DIAGNOSIS — Z9181 History of falling: Secondary | ICD-10-CM | POA: Diagnosis not present

## 2022-07-13 DIAGNOSIS — F32A Depression, unspecified: Secondary | ICD-10-CM | POA: Diagnosis not present

## 2022-07-13 DIAGNOSIS — I5022 Chronic systolic (congestive) heart failure: Secondary | ICD-10-CM | POA: Diagnosis not present

## 2022-07-17 ENCOUNTER — Encounter: Payer: Self-pay | Admitting: Family Medicine

## 2022-07-17 ENCOUNTER — Encounter: Payer: Self-pay | Admitting: *Deleted

## 2022-07-17 ENCOUNTER — Ambulatory Visit: Payer: Self-pay

## 2022-07-17 ENCOUNTER — Ambulatory Visit: Payer: Self-pay | Admitting: *Deleted

## 2022-07-17 ENCOUNTER — Ambulatory Visit (INDEPENDENT_AMBULATORY_CARE_PROVIDER_SITE_OTHER): Payer: Medicare Other | Admitting: Family Medicine

## 2022-07-17 ENCOUNTER — Ambulatory Visit: Payer: Medicare Other | Admitting: Family Medicine

## 2022-07-17 VITALS — BP 83/53 | HR 82 | Temp 97.6°F | Resp 20 | Wt 240.0 lb

## 2022-07-17 DIAGNOSIS — R42 Dizziness and giddiness: Secondary | ICD-10-CM | POA: Diagnosis not present

## 2022-07-17 DIAGNOSIS — E1159 Type 2 diabetes mellitus with other circulatory complications: Secondary | ICD-10-CM

## 2022-07-17 DIAGNOSIS — M25561 Pain in right knee: Secondary | ICD-10-CM | POA: Diagnosis not present

## 2022-07-17 DIAGNOSIS — I152 Hypertension secondary to endocrine disorders: Secondary | ICD-10-CM

## 2022-07-17 DIAGNOSIS — D649 Anemia, unspecified: Secondary | ICD-10-CM | POA: Diagnosis not present

## 2022-07-17 NOTE — Assessment & Plan Note (Signed)
S/p fall. No joint laxity but with swelling, bruising on exam. No obvious fracture seen on bedside US. Will obtain XR to further assess.

## 2022-07-17 NOTE — Assessment & Plan Note (Addendum)
Hypotensive today with orthostatic symptoms. May have contributed to mechanical fall. Will hold losartan. Encouraged home BP monitoring and adequate oral hydration. Will also check Hb, anemia panel given h/o anemia.  F/u in 1 week.

## 2022-07-17 NOTE — Patient Instructions (Signed)
It was great to see you!  Our plans for today:  - stop taking your losartan.  - We are getting an xray of your knee.  - Use vaseline on your skin tears and keep them covered.  - come back next week.   We are checking some labs today, we will release these results to your MyChart.  Take care and seek immediate care sooner if you develop any concerns.   Dr. Ky Barban

## 2022-07-17 NOTE — Telephone Encounter (Signed)
[facility] agent had pt. Complaining of dizziness, weakness, fell yesterday. Pt. Disconnected the line. Called back and left message for pt. To call back.

## 2022-07-17 NOTE — Telephone Encounter (Signed)
  Chief Complaint: Swollen Knee S/P Fall Symptoms: [place] last night "Tripped, went down 3 steps." Right knee swollen and several abrasions . EMS called, did not transport. Pt states BP when EMS arrived 100/4. States has been running 105-115/50's. Denies dizziness then or presently. Reports generalized weakness. Can walk unassisted "But knee painful." Also hit head "EMS said it was OK." Frequency: Last night Pertinent Negatives: Patient denies headache presently,dizziness Disposition: '[]'$ ED /'[]'$ Urgent Care (no appt availability in office) / '[x]'$ Appointment(In office/virtual)/ '[]'$  [facility]/ '[]'$ Home Care/ '[]'$ Refused Recommended Disposition /'[]'$ [facility]/ '[]'$  Follow-up with PCP Additional Notes: Appt secured for this afternoon. Disconnected before care advise provided. Reason for Disposition  [1] MODERATE weakness (i.e., interferes with work, school, normal activities) AND [2] new-onset or worsening  Answer Assessment - Initial Assessment Questions 1. MECHANISM: "How did the fall happen?"     Tripped down 3 steps 2. DOMESTIC VIOLENCE AND ELDER ABUSE SCREENING: "Did you fall because someone pushed you or tried to hurt you?" If Yes, ask: "Are you safe now?"     no 3. ONSET: "When did the fall happen?" (e.g., minutes, hours, or days ago)     Last night 10am 4. LOCATION: "What part of the body hit the ground?" (e.g., back, buttocks, head, hips, knees, hands, head, stomach)     Right knee swollen "Bad" Can walk but not good.  5. INJURY: "Did you hurt (injure) yourself when you fell?" If Yes, ask: "What did you injure? Tell me more about this?" (e.g., body area; type of injury; pain severity)"      Right knee swollen "Bad" Can walk but not good"  Lacerations side of leg Left elbow small wound. Hit head.  EMS said OK 6. PAIN: "Is there any pain?" If Yes, ask: "How bad is the pain?" (e.g., Scale 1-10; or mild,  moderate, severe)   - NONE (0): No pain   - MILD (1-3): Doesn't interfere  with normal activities    - MODERATE (4-7): Interferes with normal activities or awakens from sleep    - SEVERE (8-10): Excruciating pain, unable to do any normal activities      8/10 7. SIZE: For cuts, bruises, or swelling, ask: "How large is it?" (e.g., inches or centimeters)      Side of left leg with laceration  9. OTHER SYMPTOMS: "Do you have any other symptoms?" (e.g., dizziness, fever, weakness; new onset or worsening).      BP 100/43 when EMS arrived. Has been running 105-115/50's. 10. CAUSE: "What do you think caused the fall (or falling)?" (e.g., tripped, dizzy spell)       Tripped  Protocols used: [facility]

## 2022-07-17 NOTE — Progress Notes (Signed)
    SUBJECTIVE:   CHIEF COMPLAINT / HPI:   [place] last night. Was going down concrete steps at her house. Had on slip on shoes and they slipped out from under her on the last step. She hit her R knee and L arm on step and her head on the door. Denies any LOC. No vision changes. Some headache, mostly behind neck, worse since fall. No blood in stool or vaginal bleeding. No dizziness but some lightheadedness when changing positions over the last week or so. Felt tired and weak prior to fall. Lives alone. No stairs inside the home. Sister brought her today. BP 105/53 at wound care last week. 100/something with EMS last night.   OBJECTIVE:   BP (!) 83/53 (BP Location: Right Arm, Patient Position: Sitting, Cuff Size: Large)   Pulse 82   Temp 97.6 F (36.4 C) (Oral)   Resp 20   Wt 240 lb (108.9 kg)   LMP  (LMP Unknown) Comment: age 71  SpO2 97%   BMI 37.59 kg/m   Gen: well appearing, in NAD Ext: WWP. Large skin tear to distal lateral L leg. Small skin tear to dorsal R forearm. MSK:  R knee: TTP over patella. Some tenderness over bilateral joint line. No joint laxity. Neuro: Alert and oriented, speech normal. Extraocular movements intact.  Intact symmetric sensation to light touch of face and extremities bilaterally.  Hearing grossly intact bilaterally.  Tongue protrudes normally with no deviation.  Shoulder shrug, smile symmetric. Finger to nose normal.  Limited US of R knee Findings: mild effusion noted in suprapatellar pouch. Spurring at bilateral joint lines with decreased joint spacing and bulging menisci. No baker's cyst. No appreciable tendinopathy.  Impression: mild suprapatellar effusion. Likely intraarticular arthritis with joint line spurring. No obvious fracture.   ASSESSMENT/PLAN:   Hypertension associated with diabetes ([place]) Hypotensive today with orthostatic symptoms. May have contributed to mechanical fall. Will hold losartan. Encouraged home BP monitoring and adequate  oral hydration. Will also check Hb, anemia panel given h/o anemia.  F/u in 1 week.  Acute pain of right knee S/p fall. No joint laxity but with swelling, bruising on exam. No obvious fracture seen on bedside US. Will obtain XR to further assess.   Skin tear To R arm and L lower leg. Petroleum and bandage applied in office. Discussed appropriate wound care.   Myles Gip, DO

## 2022-07-17 NOTE — Telephone Encounter (Signed)
Patient call back to request later appt due to lack of transportation at previous schedule time. Patient rescheduled for 2:40 pm today .

## 2022-07-17 NOTE — Telephone Encounter (Signed)
This encounter was created in error - please disregard.

## 2022-07-18 DIAGNOSIS — T8131XD Disruption of external operation (surgical) wound, not elsewhere classified, subsequent encounter: Secondary | ICD-10-CM | POA: Diagnosis not present

## 2022-07-18 DIAGNOSIS — Z87891 Personal history of nicotine dependence: Secondary | ICD-10-CM | POA: Diagnosis not present

## 2022-07-18 DIAGNOSIS — Z7984 Long term (current) use of oral hypoglycemic drugs: Secondary | ICD-10-CM | POA: Diagnosis not present

## 2022-07-18 DIAGNOSIS — I2510 Atherosclerotic heart disease of native coronary artery without angina pectoris: Secondary | ICD-10-CM | POA: Diagnosis not present

## 2022-07-18 DIAGNOSIS — M545 Low back pain, unspecified: Secondary | ICD-10-CM | POA: Diagnosis not present

## 2022-07-18 DIAGNOSIS — T8141XD Infection following a procedure, superficial incisional surgical site, subsequent encounter: Secondary | ICD-10-CM | POA: Diagnosis not present

## 2022-07-18 DIAGNOSIS — E1151 Type 2 diabetes mellitus with diabetic peripheral angiopathy without gangrene: Secondary | ICD-10-CM | POA: Diagnosis not present

## 2022-07-18 DIAGNOSIS — I152 Hypertension secondary to endocrine disorders: Secondary | ICD-10-CM | POA: Diagnosis not present

## 2022-07-18 DIAGNOSIS — I110 Hypertensive heart disease with heart failure: Secondary | ICD-10-CM | POA: Diagnosis not present

## 2022-07-18 DIAGNOSIS — I878 Other specified disorders of veins: Secondary | ICD-10-CM | POA: Diagnosis not present

## 2022-07-18 DIAGNOSIS — I872 Venous insufficiency (chronic) (peripheral): Secondary | ICD-10-CM | POA: Diagnosis not present

## 2022-07-18 DIAGNOSIS — F32A Depression, unspecified: Secondary | ICD-10-CM | POA: Diagnosis not present

## 2022-07-18 DIAGNOSIS — M069 Rheumatoid arthritis, unspecified: Secondary | ICD-10-CM | POA: Diagnosis not present

## 2022-07-18 DIAGNOSIS — M797 Fibromyalgia: Secondary | ICD-10-CM | POA: Diagnosis not present

## 2022-07-18 DIAGNOSIS — I5022 Chronic systolic (congestive) heart failure: Secondary | ICD-10-CM | POA: Diagnosis not present

## 2022-07-18 DIAGNOSIS — E785 Hyperlipidemia, unspecified: Secondary | ICD-10-CM | POA: Diagnosis not present

## 2022-07-18 DIAGNOSIS — G8929 Other chronic pain: Secondary | ICD-10-CM | POA: Diagnosis not present

## 2022-07-18 DIAGNOSIS — Z9181 History of falling: Secondary | ICD-10-CM | POA: Diagnosis not present

## 2022-07-18 DIAGNOSIS — E1159 Type 2 diabetes mellitus with other circulatory complications: Secondary | ICD-10-CM | POA: Diagnosis not present

## 2022-07-18 DIAGNOSIS — Z96652 Presence of left artificial knee joint: Secondary | ICD-10-CM | POA: Diagnosis not present

## 2022-07-18 DIAGNOSIS — M1990 Unspecified osteoarthritis, unspecified site: Secondary | ICD-10-CM | POA: Diagnosis not present

## 2022-07-18 DIAGNOSIS — M48061 Spinal stenosis, lumbar region without neurogenic claudication: Secondary | ICD-10-CM | POA: Diagnosis not present

## 2022-07-18 LAB — BASIC METABOLIC PANEL
BUN/Creatinine Ratio: 23 (ref 12–28)
BUN: 33 mg/dL — ABNORMAL HIGH (ref 8–27)
CO2: 21 mmol/L (ref 20–29)
Calcium: 8.9 mg/dL (ref 8.7–10.3)
Chloride: 97 mmol/L (ref 96–106)
Creatinine, Ser: 1.45 mg/dL — ABNORMAL HIGH (ref 0.57–1.00)
Glucose: 213 mg/dL — ABNORMAL HIGH (ref 70–99)
Potassium: 4.9 mmol/L (ref 3.5–5.2)
Sodium: 139 mmol/L (ref 134–144)
eGFR: 39 mL/min/{1.73_m2} — ABNORMAL LOW (ref >59)

## 2022-07-18 LAB — CBC WITH DIFFERENTIAL/PLATELET
Basophils Absolute: 0 10*3/uL (ref 0.0–0.2)
Basos: 0 %
EOS (ABSOLUTE): 0.1 10*3/uL (ref 0.0–0.4)
Eos: 1 %
Hematocrit: 31.8 % — ABNORMAL LOW (ref 34.0–46.6)
Hemoglobin: 9.9 g/dL — ABNORMAL LOW (ref 11.1–15.9)
Immature Grans (Abs): 0 10*3/uL (ref 0.0–0.1)
Immature Granulocytes: 0 %
Lymphocytes Absolute: 1.3 10*3/uL (ref 0.7–3.1)
Lymphs: 13 %
MCH: 22.1 pg — ABNORMAL LOW (ref 26.6–33.0)
MCHC: 31.1 g/dL — ABNORMAL LOW (ref 31.5–35.7)
MCV: 71 fL — ABNORMAL LOW (ref 79–97)
Monocytes Absolute: 0.7 10*3/uL (ref 0.1–0.9)
Monocytes: 7 %
Neutrophils Absolute: 8.1 10*3/uL — ABNORMAL HIGH (ref 1.4–7.0)
Neutrophils: 79 %
Platelets: 457 10*3/uL — ABNORMAL HIGH (ref 150–450)
RBC: 4.47 x10E6/uL (ref 3.77–5.28)
RDW: 17.1 % — ABNORMAL HIGH (ref 11.7–15.4)
WBC: 10.1 10*3/uL (ref 3.4–10.8)

## 2022-07-18 LAB — IRON AND TIBC
Iron Saturation: 7 % — CL (ref 15–55)
Iron: 22 ug/dL — ABNORMAL LOW (ref 27–139)
Total Iron Binding Capacity: 294 ug/dL (ref 250–450)
UIBC: 272 ug/dL (ref 118–369)

## 2022-07-18 LAB — FERRITIN: Ferritin: 47 ng/mL (ref 15–150)

## 2022-07-18 LAB — VITAMIN B12: Vitamin B-12: 429 pg/mL (ref 232–1245)

## 2022-07-18 LAB — FOLATE: Folate: >20 ng/mL (ref >3.0)

## 2022-07-18 MED ORDER — IRON (FERROUS SULFATE) 325 (65 FE) MG PO TABS: 325.0000 mg | ORAL_TABLET | Freq: Every day | ORAL | 0 refills | Status: DC

## 2022-07-18 NOTE — Addendum Note (Signed)
Addended by: Myles Gip on: 07/18/2022 12:12 PM   Modules accepted: Orders

## 2022-07-19 NOTE — Telephone Encounter (Signed)
Copied from [place] [id number]. Topic: General - Inquiry >> Jul 18, 2022  3:53 PM [name] wrote: Caller wanted to inform covering PCP, patient had a fall on Monday and stated office is aware, patient has a skin tear to the right lower leg requesting orders for  xeroform

## 2022-07-19 NOTE — Telephone Encounter (Signed)
Left detailed voicemail advising as below.

## 2022-07-20 ENCOUNTER — Other Ambulatory Visit: Payer: Self-pay | Admitting: Orthopedic Surgery

## 2022-07-20 ENCOUNTER — Other Ambulatory Visit (HOSPITAL_COMMUNITY): Payer: Self-pay | Admitting: Orthopedic Surgery

## 2022-07-20 ENCOUNTER — Other Ambulatory Visit: Payer: Self-pay | Admitting: Family Medicine

## 2022-07-20 DIAGNOSIS — Z7984 Long term (current) use of oral hypoglycemic drugs: Secondary | ICD-10-CM | POA: Diagnosis not present

## 2022-07-20 DIAGNOSIS — I152 Hypertension secondary to endocrine disorders: Secondary | ICD-10-CM | POA: Diagnosis not present

## 2022-07-20 DIAGNOSIS — Z87891 Personal history of nicotine dependence: Secondary | ICD-10-CM | POA: Diagnosis not present

## 2022-07-20 DIAGNOSIS — G8929 Other chronic pain: Secondary | ICD-10-CM | POA: Diagnosis not present

## 2022-07-20 DIAGNOSIS — T8141XD Infection following a procedure, superficial incisional surgical site, subsequent encounter: Secondary | ICD-10-CM | POA: Diagnosis not present

## 2022-07-20 DIAGNOSIS — T8131XD Disruption of external operation (surgical) wound, not elsewhere classified, subsequent encounter: Secondary | ICD-10-CM | POA: Diagnosis not present

## 2022-07-20 DIAGNOSIS — I872 Venous insufficiency (chronic) (peripheral): Secondary | ICD-10-CM | POA: Diagnosis not present

## 2022-07-20 DIAGNOSIS — M069 Rheumatoid arthritis, unspecified: Secondary | ICD-10-CM | POA: Diagnosis not present

## 2022-07-20 DIAGNOSIS — E785 Hyperlipidemia, unspecified: Secondary | ICD-10-CM | POA: Diagnosis not present

## 2022-07-20 DIAGNOSIS — M797 Fibromyalgia: Secondary | ICD-10-CM | POA: Diagnosis not present

## 2022-07-20 DIAGNOSIS — M48061 Spinal stenosis, lumbar region without neurogenic claudication: Secondary | ICD-10-CM | POA: Diagnosis not present

## 2022-07-20 DIAGNOSIS — M25512 Pain in left shoulder: Secondary | ICD-10-CM

## 2022-07-20 DIAGNOSIS — I110 Hypertensive heart disease with heart failure: Secondary | ICD-10-CM | POA: Diagnosis not present

## 2022-07-20 DIAGNOSIS — Z9181 History of falling: Secondary | ICD-10-CM | POA: Diagnosis not present

## 2022-07-20 DIAGNOSIS — I878 Other specified disorders of veins: Secondary | ICD-10-CM | POA: Diagnosis not present

## 2022-07-20 DIAGNOSIS — Z96652 Presence of left artificial knee joint: Secondary | ICD-10-CM | POA: Diagnosis not present

## 2022-07-20 DIAGNOSIS — M1990 Unspecified osteoarthritis, unspecified site: Secondary | ICD-10-CM | POA: Diagnosis not present

## 2022-07-20 DIAGNOSIS — I2510 Atherosclerotic heart disease of native coronary artery without angina pectoris: Secondary | ICD-10-CM | POA: Diagnosis not present

## 2022-07-20 DIAGNOSIS — M545 Low back pain, unspecified: Secondary | ICD-10-CM | POA: Diagnosis not present

## 2022-07-20 DIAGNOSIS — F32A Depression, unspecified: Secondary | ICD-10-CM | POA: Diagnosis not present

## 2022-07-20 DIAGNOSIS — E1159 Type 2 diabetes mellitus with other circulatory complications: Secondary | ICD-10-CM | POA: Diagnosis not present

## 2022-07-20 DIAGNOSIS — E1151 Type 2 diabetes mellitus with diabetic peripheral angiopathy without gangrene: Secondary | ICD-10-CM | POA: Diagnosis not present

## 2022-07-20 DIAGNOSIS — I5022 Chronic systolic (congestive) heart failure: Secondary | ICD-10-CM | POA: Diagnosis not present

## 2022-07-20 NOTE — Telephone Encounter (Signed)
Medication Refill - Medication: pregabalin (LYRICA) 75 MG capsule   Has the patient contacted their pharmacy? Yes.   (Agent: If no, request that the patient contact the pharmacy for the refill. If patient does not wish to contact the pharmacy document the reason why and proceed with request.) (Agent: If yes, when and what did the pharmacy advise?)  Preferred Pharmacy (with phone number or street name): [facility]  Has the patient been seen for an appointment in the last year OR does the patient have an upcoming appointment? Yes.    Agent: Please be advised that RX refills may take up to 3 business days. We ask that you follow-up with your pharmacy.

## 2022-07-20 NOTE — Telephone Encounter (Signed)
Requested medication (s) are due for refill today: yes  Requested medication (s) are on the active medication list: yes  Last refill:  06/14/22 #90/0  Future visit scheduled: yes  Notes to clinic:  Unable to refill per protocol, cannot delegate.    Requested Prescriptions  Pending Prescriptions Disp Refills   pregabalin (LYRICA) 75 MG capsule 90 capsule 0    Sig: Take 1 capsule (75 mg total) by mouth 3 (three) times daily.     Not Delegated - Neurology:  Anticonvulsants - Controlled - pregabalin Failed - 07/20/2022  9:54 AM      Failed - This refill cannot be delegated      Failed - Cr in normal range and within 360 days    Creatinine  Date Value Ref Range Status  08/31/2014 0.63 0.60 - 1.30 mg/dL Final   Creatinine, Ser  Date Value Ref Range Status  07/17/2022 1.45 (H) 0.57 - 1.00 mg/dL Final         Passed - Completed PHQ-2 or PHQ-9 in the last 360 days      Passed - Valid encounter within last 12 months    Recent Outpatient Visits           3 days ago Anemia, unspecified type   [place], DO   2 weeks ago Hair loss   [place], Jake Church, DO   1 month ago Financial insecurity   [facility], Dionne Bucy, MD   3 months ago Encounter for annual wellness visit (AWV) in Medicare patient   [place], MD   3 months ago Adhesive capsulitis of both shoulders   [facility] [place], Dionne Bucy, MD       Future Appointments             In 1 week [place], Jake Church, [place], [facility]

## 2022-07-23 ENCOUNTER — Other Ambulatory Visit: Payer: Self-pay | Admitting: Family Medicine

## 2022-07-23 ENCOUNTER — Ambulatory Visit: Payer: Medicare Other | Admitting: Physician Assistant

## 2022-07-23 DIAGNOSIS — T8131XA Disruption of external operation (surgical) wound, not elsewhere classified, initial encounter: Secondary | ICD-10-CM | POA: Diagnosis not present

## 2022-07-23 DIAGNOSIS — S81801A Unspecified open wound, right lower leg, initial encounter: Secondary | ICD-10-CM | POA: Diagnosis not present

## 2022-07-23 MED ORDER — DULOXETINE HCL 60 MG PO CPEP: 60.0000 mg | ORAL_CAPSULE | Freq: Every day | ORAL | 2 refills | Status: DC

## 2022-07-23 MED ORDER — PREGABALIN 75 MG PO CAPS: 75.0000 mg | ORAL_CAPSULE | Freq: Three times a day (TID) | ORAL | 0 refills | Status: DC

## 2022-07-24 ENCOUNTER — Ambulatory Visit: Payer: Medicare Other | Admitting: *Deleted

## 2022-07-24 DIAGNOSIS — E1159 Type 2 diabetes mellitus with other circulatory complications: Secondary | ICD-10-CM

## 2022-07-24 DIAGNOSIS — E119 Type 2 diabetes mellitus without complications: Secondary | ICD-10-CM

## 2022-07-24 DIAGNOSIS — F332 Major depressive disorder, recurrent severe without psychotic features: Secondary | ICD-10-CM

## 2022-07-24 DIAGNOSIS — Z5986 Financial insecurity: Secondary | ICD-10-CM

## 2022-07-25 ENCOUNTER — Other Ambulatory Visit: Payer: Self-pay | Admitting: *Deleted

## 2022-07-25 DIAGNOSIS — T8141XD Infection following a procedure, superficial incisional surgical site, subsequent encounter: Secondary | ICD-10-CM | POA: Diagnosis not present

## 2022-07-25 DIAGNOSIS — Z87891 Personal history of nicotine dependence: Secondary | ICD-10-CM | POA: Diagnosis not present

## 2022-07-25 DIAGNOSIS — Z96652 Presence of left artificial knee joint: Secondary | ICD-10-CM | POA: Diagnosis not present

## 2022-07-25 DIAGNOSIS — F32A Depression, unspecified: Secondary | ICD-10-CM | POA: Diagnosis not present

## 2022-07-25 DIAGNOSIS — E785 Hyperlipidemia, unspecified: Secondary | ICD-10-CM | POA: Diagnosis not present

## 2022-07-25 DIAGNOSIS — M48061 Spinal stenosis, lumbar region without neurogenic claudication: Secondary | ICD-10-CM | POA: Diagnosis not present

## 2022-07-25 DIAGNOSIS — I872 Venous insufficiency (chronic) (peripheral): Secondary | ICD-10-CM | POA: Diagnosis not present

## 2022-07-25 DIAGNOSIS — G8929 Other chronic pain: Secondary | ICD-10-CM | POA: Diagnosis not present

## 2022-07-25 DIAGNOSIS — E1159 Type 2 diabetes mellitus with other circulatory complications: Secondary | ICD-10-CM | POA: Diagnosis not present

## 2022-07-25 DIAGNOSIS — Z9181 History of falling: Secondary | ICD-10-CM | POA: Diagnosis not present

## 2022-07-25 DIAGNOSIS — I110 Hypertensive heart disease with heart failure: Secondary | ICD-10-CM | POA: Diagnosis not present

## 2022-07-25 DIAGNOSIS — I5022 Chronic systolic (congestive) heart failure: Secondary | ICD-10-CM | POA: Diagnosis not present

## 2022-07-25 DIAGNOSIS — Z7984 Long term (current) use of oral hypoglycemic drugs: Secondary | ICD-10-CM | POA: Diagnosis not present

## 2022-07-25 DIAGNOSIS — M069 Rheumatoid arthritis, unspecified: Secondary | ICD-10-CM | POA: Diagnosis not present

## 2022-07-25 DIAGNOSIS — T8131XD Disruption of external operation (surgical) wound, not elsewhere classified, subsequent encounter: Secondary | ICD-10-CM | POA: Diagnosis not present

## 2022-07-25 DIAGNOSIS — E1151 Type 2 diabetes mellitus with diabetic peripheral angiopathy without gangrene: Secondary | ICD-10-CM | POA: Diagnosis not present

## 2022-07-25 DIAGNOSIS — I152 Hypertension secondary to endocrine disorders: Secondary | ICD-10-CM | POA: Diagnosis not present

## 2022-07-25 DIAGNOSIS — I2510 Atherosclerotic heart disease of native coronary artery without angina pectoris: Secondary | ICD-10-CM | POA: Diagnosis not present

## 2022-07-25 DIAGNOSIS — M1990 Unspecified osteoarthritis, unspecified site: Secondary | ICD-10-CM | POA: Diagnosis not present

## 2022-07-25 DIAGNOSIS — M797 Fibromyalgia: Secondary | ICD-10-CM | POA: Diagnosis not present

## 2022-07-25 DIAGNOSIS — I878 Other specified disorders of veins: Secondary | ICD-10-CM | POA: Diagnosis not present

## 2022-07-25 DIAGNOSIS — M545 Low back pain, unspecified: Secondary | ICD-10-CM | POA: Diagnosis not present

## 2022-07-25 NOTE — Telephone Encounter (Signed)
Pharmacy called and states she needs a refill on gemtesa . Called patient and states she has got lot of refills still left.

## 2022-07-25 NOTE — Chronic Care Management (AMB) (Addendum)
Chronic Care Management    Clinical Social Work Note  07/25/2022 Name: [name] [name] MRN: [id number] DOB: 1951/11/07  [name] [name] is a 71 y.o. year old female who is a primary care patient of Bacigalupo, Dionne Bucy, MD. The CCM team was consulted to assist the patient with chronic disease management and/or care coordination needs related to: [employer] .   Engaged with patient by telephone for follow up visit in response to provider referral for social work chronic care management and care coordination services.   Consent to Services:  The patient was given information about Chronic Care Management services, agreed to services, and gave verbal consent prior to initiation of services.  Please see initial visit note for detailed documentation.   Patient agreed to services and consent obtained.   Assessment: Review of patient past medical history, allergies, medications, and health status, including review of relevant consultants reports was performed today as part of a comprehensive evaluation and provision of chronic care management and care coordination services.     SDOH (Social Determinants of Health) assessments and interventions performed:    Advanced Directives Status: Not addressed in this encounter.  CCM Care Plan  Allergies  Allergen Reactions   Sulfa Antibiotics Itching, Rash and Swelling   Latex Itching    Other reaction(s): angioedema   Doxycycline Rash   Empagliflozin Itching and Other (See Comments)    Itching and frequent yeast infections    Outpatient Encounter Medications as of 07/24/2022  Medication Sig Note   Biotin w/ Vitamins C & E (HAIR/SKIN/NAILS PO) Take 1 tablet by mouth daily.    buPROPion 450 MG TB24 Take 450 mg by mouth daily.    carvedilol (COREG) 6.25 MG tablet TAKE ONE TABLET BY MOUTH TWICE DAILY    DULoxetine (CYMBALTA) 60 MG capsule Take 1 capsule (60 mg total) by mouth daily.    ezetimibe (ZETIA) 10 MG tablet TAKE 1 TABLET BY MOUTH  EVERY DAY (Patient taking differently: Take 10 mg by mouth daily.)    GEMTESA 75 MG TABS Take 1 tablet by mouth daily.    Iron, Ferrous Sulfate, 325 (65 Fe) MG TABS Take 325 mg by mouth daily.    losartan (COZAAR) 25 MG tablet TAKE ONE TABLET BY MOUTH DAILY    meloxicam (MOBIC) 15 MG tablet TAKE 1 TABLET (15 MG TOTAL) BY MOUTH DAILY.    metFORMIN (GLUCOPHAGE) 500 MG tablet NEW PRESCRIPTION REQUEST: TAKE ONE TABLET BY MOUTH TWICE DAILY    polyethylene glycol (MIRALAX / GLYCOLAX) 17 g packet Take 17 g by mouth daily as needed for mild constipation.    pregabalin (LYRICA) 75 MG capsule Take 1 capsule (75 mg total) by mouth 3 (three) times daily.    tolterodine (DETROL LA) 4 MG 24 hr capsule Take 4 mg by mouth daily. 03/14/2022: Pt states this replaced Gemtasa, per [facility] last fill date 08/07/2021 #90 for 90 day supply   No facility-administered encounter medications on file as of 07/24/2022.    Patient Active Problem List   Diagnosis Date Noted   Acute pain of right knee 07/17/2022   Passive suicidal ideations 05/31/2022   Financial insecurity 05/31/2022   Wound infection 03/13/2022   Normocytic anemia 03/03/2022   Abdominal wall cellulitis 03/03/2022   Wound dehiscence 03/03/2022   Stricture esophagus 02/21/2022   Hyponatremia 02/15/2022   Hypoalbuminemia 02/15/2022   Post op infection 02/15/2022    Class: Question of   Anemia associated with acute blood loss 02/15/2022  Hypokalemia 02/15/2022   Right arm pain 10/19/2021   Housing instability 03/28/2021   Dermatochalasis of both upper eyelids 03/23/2021   Exposure keratopathy, 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   Ptosis of both eyebrows 03/23/2021   Ptosis of both upper eyelids 03/23/2021   Senile ectropion of both lower eyelids 03/23/2021   Senile purpura ([place]) 03/07/2021   Panniculitis 02/03/2021   Myalgia due to statin 12/26/2020   Telogen effluvium 12/26/2020   Candidal intertrigo 12/06/2020   Microcytic anemia 12/06/2020    Polyneuropathy associated with underlying disease ([place]) 02/03/2020   Lump in chest 12/28/2019   Mixed stress and urge urinary incontinence 08/21/2019   Avitaminosis D 07/15/2019   Drug-induced constipation 06/12/2019   Lumbar spondylosis 07/08/2018   Chronic bilateral low back pain with bilateral sciatica 07/08/2018   Chronic pain syndrome 07/08/2018   Urinary incontinence 03/19/2018   Complex endometrial hyperplasia with atypia 09/03/2017   Atypical endometrial hyperplasia 08/15/2017   Bilateral shoulder pain 07/18/2017   Lymphedema 01/31/2017   Hyperlipidemia associated with type 2 diabetes mellitus ([place]) 12/14/2016   Hypertension associated with diabetes ([place]) 74/07/1447   Chronic systolic CHF (congestive heart failure), NYHA class 3 ([place]) 10/06/2015   History of MI (myocardial infarction) 09/20/2015   Allergic rhinitis 07/06/2015   Chronic venous insufficiency 07/06/2015   Atherosclerosis of coronary artery 07/06/2015   MDD (major depressive disorder) 07/06/2015   Fibromyalgia 07/06/2015   Rheumatoid arthritis involving multiple sites with positive rheumatoid factor ([place]) 07/06/2015   Type 2 diabetes mellitus with complication, without long-term current use of insulin ([place]) 04/08/2014   Migraines 04/08/2014   Gastroduodenal ulcer 04/08/2014   Venous stasis 04/08/2014   Osteoarthritis 04/08/2014   Coronary artery disease 04/08/2014   Peptic ulcer disease 04/08/2014   Obesity 04/08/2014   Seropositive rheumatoid arthritis ([place]) 04/06/2014    Conditions to be addressed/monitored: Anxiety and Depression; Financial constraints related to decrease in income and Mental Health Concerns   Care Plan : General Social Work (Adult)  Updates made by [employer], Darla Lesches, LCSW since 07/25/2022 12:00 AM     Problem: CHL AMB "PATIENT-SPECIFIC PROBLEM"   Note:   CARE PLAN ENTRY (see longitudinal plan of care for additional care plan information)  Current Barriers:  Patient with Anxiety and  Depression in need of assistance with connection to community resources  Knowledge deficits and need for support, education and care coordination related to community resources support  Financial constraints related to decrease in income  Patients states that money is now being deducted from her social security due to a overpayment. Patient now in financial difficulty and having trouble taking care of her monthly financial needs. Patient also has history of depression and anxiety  Clinical Goal(s)  Over the next 90 days, patient will work with care management team member to address concerns related to financial strain  Interventions provided by LCSW:   Assessed patient's care coordination needs related to financial needs and discussed ongoing care management follow up  Patient discussed now needing rotator cuff surgery Continued to discuss options to manage and adjust finances including requesting assistance from family and friends, concentrating on essentials, utilizing transportation benefit and catching up on utility bill with monthly financial assistance received through insurance company-Filing for Bankruptcy also discussed as a possibility Patient has requested assistance from a friend, however they have declined to assist. She further confirms  reaching out to consumer credit counseling  Patient continues to confirm that she has applied for senior housing as well as Section 8 and has  been placed on the wait list.   Patient confirmed that she has contacted the Department of Social Services for assistance with her utility bills, however they remain out of funds Continued to provide emotional support to patient related to her financial concerns,  all available options provided. Provided patient with this social worker's contact information for any additional community resource needs. Motivational Interviewing employed Solution-Focused Strategies employed:  Active listening / Reflection utilized   Emotional Support Provided    Patient Self Care Activities & Deficits:  Patient is unable to independently navigate community resource options without care coordination support  Acknowledges deficits and is motivated to resolve concern  Patient is able to contact   as discussed today Unable to perform ADLs independently Ability for insight Independent living Motivation for treatment Strong family or social support  Please see past updates related to this goal by clicking on the "Past Updates" button in the selected goal         Follow Up Plan: Client will contact this social worker with any additional community resource needs       [place], [place] Worker  [facility] [phone]

## 2022-07-27 DIAGNOSIS — Z87891 Personal history of nicotine dependence: Secondary | ICD-10-CM | POA: Diagnosis not present

## 2022-07-27 DIAGNOSIS — Z96652 Presence of left artificial knee joint: Secondary | ICD-10-CM | POA: Diagnosis not present

## 2022-07-27 DIAGNOSIS — I5022 Chronic systolic (congestive) heart failure: Secondary | ICD-10-CM | POA: Diagnosis not present

## 2022-07-27 DIAGNOSIS — E1159 Type 2 diabetes mellitus with other circulatory complications: Secondary | ICD-10-CM | POA: Diagnosis not present

## 2022-07-27 DIAGNOSIS — I152 Hypertension secondary to endocrine disorders: Secondary | ICD-10-CM | POA: Diagnosis not present

## 2022-07-27 DIAGNOSIS — Z9181 History of falling: Secondary | ICD-10-CM | POA: Diagnosis not present

## 2022-07-27 DIAGNOSIS — M1990 Unspecified osteoarthritis, unspecified site: Secondary | ICD-10-CM | POA: Diagnosis not present

## 2022-07-27 DIAGNOSIS — G8929 Other chronic pain: Secondary | ICD-10-CM | POA: Diagnosis not present

## 2022-07-27 DIAGNOSIS — E1151 Type 2 diabetes mellitus with diabetic peripheral angiopathy without gangrene: Secondary | ICD-10-CM | POA: Diagnosis not present

## 2022-07-27 DIAGNOSIS — T8141XD Infection following a procedure, superficial incisional surgical site, subsequent encounter: Secondary | ICD-10-CM | POA: Diagnosis not present

## 2022-07-27 DIAGNOSIS — Z7984 Long term (current) use of oral hypoglycemic drugs: Secondary | ICD-10-CM | POA: Diagnosis not present

## 2022-07-27 DIAGNOSIS — M797 Fibromyalgia: Secondary | ICD-10-CM | POA: Diagnosis not present

## 2022-07-27 DIAGNOSIS — E785 Hyperlipidemia, unspecified: Secondary | ICD-10-CM | POA: Diagnosis not present

## 2022-07-27 DIAGNOSIS — M069 Rheumatoid arthritis, unspecified: Secondary | ICD-10-CM | POA: Diagnosis not present

## 2022-07-27 DIAGNOSIS — I110 Hypertensive heart disease with heart failure: Secondary | ICD-10-CM | POA: Diagnosis not present

## 2022-07-27 DIAGNOSIS — F32A Depression, unspecified: Secondary | ICD-10-CM | POA: Diagnosis not present

## 2022-07-27 DIAGNOSIS — I2510 Atherosclerotic heart disease of native coronary artery without angina pectoris: Secondary | ICD-10-CM | POA: Diagnosis not present

## 2022-07-27 DIAGNOSIS — I872 Venous insufficiency (chronic) (peripheral): Secondary | ICD-10-CM | POA: Diagnosis not present

## 2022-07-27 DIAGNOSIS — M545 Low back pain, unspecified: Secondary | ICD-10-CM | POA: Diagnosis not present

## 2022-07-27 DIAGNOSIS — T8131XD Disruption of external operation (surgical) wound, not elsewhere classified, subsequent encounter: Secondary | ICD-10-CM | POA: Diagnosis not present

## 2022-07-27 DIAGNOSIS — I878 Other specified disorders of veins: Secondary | ICD-10-CM | POA: Diagnosis not present

## 2022-07-27 DIAGNOSIS — M48061 Spinal stenosis, lumbar region without neurogenic claudication: Secondary | ICD-10-CM | POA: Diagnosis not present

## 2022-07-30 ENCOUNTER — Ambulatory Visit: Payer: Medicare Other | Admitting: Family Medicine

## 2022-08-01 DIAGNOSIS — I152 Hypertension secondary to endocrine disorders: Secondary | ICD-10-CM | POA: Diagnosis not present

## 2022-08-01 DIAGNOSIS — M797 Fibromyalgia: Secondary | ICD-10-CM | POA: Diagnosis not present

## 2022-08-01 DIAGNOSIS — I872 Venous insufficiency (chronic) (peripheral): Secondary | ICD-10-CM | POA: Diagnosis not present

## 2022-08-01 DIAGNOSIS — T8131XD Disruption of external operation (surgical) wound, not elsewhere classified, subsequent encounter: Secondary | ICD-10-CM | POA: Diagnosis not present

## 2022-08-01 DIAGNOSIS — M48061 Spinal stenosis, lumbar region without neurogenic claudication: Secondary | ICD-10-CM | POA: Diagnosis not present

## 2022-08-01 DIAGNOSIS — T8141XD Infection following a procedure, superficial incisional surgical site, subsequent encounter: Secondary | ICD-10-CM | POA: Diagnosis not present

## 2022-08-01 DIAGNOSIS — E1151 Type 2 diabetes mellitus with diabetic peripheral angiopathy without gangrene: Secondary | ICD-10-CM | POA: Diagnosis not present

## 2022-08-01 DIAGNOSIS — M545 Low back pain, unspecified: Secondary | ICD-10-CM | POA: Diagnosis not present

## 2022-08-01 DIAGNOSIS — Z7984 Long term (current) use of oral hypoglycemic drugs: Secondary | ICD-10-CM | POA: Diagnosis not present

## 2022-08-01 DIAGNOSIS — I5022 Chronic systolic (congestive) heart failure: Secondary | ICD-10-CM | POA: Diagnosis not present

## 2022-08-01 DIAGNOSIS — E785 Hyperlipidemia, unspecified: Secondary | ICD-10-CM | POA: Diagnosis not present

## 2022-08-01 DIAGNOSIS — M069 Rheumatoid arthritis, unspecified: Secondary | ICD-10-CM | POA: Diagnosis not present

## 2022-08-01 DIAGNOSIS — I110 Hypertensive heart disease with heart failure: Secondary | ICD-10-CM | POA: Diagnosis not present

## 2022-08-01 DIAGNOSIS — G8929 Other chronic pain: Secondary | ICD-10-CM | POA: Diagnosis not present

## 2022-08-01 DIAGNOSIS — Z96652 Presence of left artificial knee joint: Secondary | ICD-10-CM | POA: Diagnosis not present

## 2022-08-01 DIAGNOSIS — Z9181 History of falling: Secondary | ICD-10-CM | POA: Diagnosis not present

## 2022-08-01 DIAGNOSIS — I878 Other specified disorders of veins: Secondary | ICD-10-CM | POA: Diagnosis not present

## 2022-08-01 DIAGNOSIS — E1159 Type 2 diabetes mellitus with other circulatory complications: Secondary | ICD-10-CM | POA: Diagnosis not present

## 2022-08-01 DIAGNOSIS — M1990 Unspecified osteoarthritis, unspecified site: Secondary | ICD-10-CM | POA: Diagnosis not present

## 2022-08-01 DIAGNOSIS — Z87891 Personal history of nicotine dependence: Secondary | ICD-10-CM | POA: Diagnosis not present

## 2022-08-01 DIAGNOSIS — F32A Depression, unspecified: Secondary | ICD-10-CM | POA: Diagnosis not present

## 2022-08-01 DIAGNOSIS — I2510 Atherosclerotic heart disease of native coronary artery without angina pectoris: Secondary | ICD-10-CM | POA: Diagnosis not present

## 2022-08-02 ENCOUNTER — Ambulatory Visit
Admission: RE | Admit: 2022-08-02 | Discharge: 2022-08-02 | Disposition: A | Discharge status: Home / Self Care | Payer: Medicare Other | Source: Ambulatory Visit | Attending: Orthopedic Surgery | Admitting: Orthopedic Surgery

## 2022-08-02 DIAGNOSIS — S46111A Strain of muscle, fascia and tendon of long head of biceps, right arm, initial encounter: Secondary | ICD-10-CM | POA: Diagnosis not present

## 2022-08-02 DIAGNOSIS — M75121 Complete rotator cuff tear or rupture of right shoulder, not specified as traumatic: Secondary | ICD-10-CM | POA: Diagnosis not present

## 2022-08-02 DIAGNOSIS — M19011 Primary osteoarthritis, right shoulder: Secondary | ICD-10-CM | POA: Diagnosis not present

## 2022-08-02 DIAGNOSIS — S46012A Strain of muscle(s) and tendon(s) of the rotator cuff of left shoulder, initial encounter: Secondary | ICD-10-CM | POA: Diagnosis not present

## 2022-08-02 DIAGNOSIS — M25512 Pain in left shoulder: Secondary | ICD-10-CM | POA: Insufficient documentation

## 2022-08-02 DIAGNOSIS — G8929 Other chronic pain: Secondary | ICD-10-CM | POA: Diagnosis not present

## 2022-08-03 ENCOUNTER — Telehealth: Payer: Self-pay

## 2022-08-03 DIAGNOSIS — M48061 Spinal stenosis, lumbar region without neurogenic claudication: Secondary | ICD-10-CM | POA: Diagnosis not present

## 2022-08-03 DIAGNOSIS — Z9181 History of falling: Secondary | ICD-10-CM | POA: Diagnosis not present

## 2022-08-03 DIAGNOSIS — M797 Fibromyalgia: Secondary | ICD-10-CM | POA: Diagnosis not present

## 2022-08-03 DIAGNOSIS — I872 Venous insufficiency (chronic) (peripheral): Secondary | ICD-10-CM | POA: Diagnosis not present

## 2022-08-03 DIAGNOSIS — I878 Other specified disorders of veins: Secondary | ICD-10-CM | POA: Diagnosis not present

## 2022-08-03 DIAGNOSIS — E1159 Type 2 diabetes mellitus with other circulatory complications: Secondary | ICD-10-CM | POA: Diagnosis not present

## 2022-08-03 DIAGNOSIS — Z7984 Long term (current) use of oral hypoglycemic drugs: Secondary | ICD-10-CM | POA: Diagnosis not present

## 2022-08-03 DIAGNOSIS — Z96652 Presence of left artificial knee joint: Secondary | ICD-10-CM | POA: Diagnosis not present

## 2022-08-03 DIAGNOSIS — I110 Hypertensive heart disease with heart failure: Secondary | ICD-10-CM | POA: Diagnosis not present

## 2022-08-03 DIAGNOSIS — I152 Hypertension secondary to endocrine disorders: Secondary | ICD-10-CM | POA: Diagnosis not present

## 2022-08-03 DIAGNOSIS — T8131XD Disruption of external operation (surgical) wound, not elsewhere classified, subsequent encounter: Secondary | ICD-10-CM | POA: Diagnosis not present

## 2022-08-03 DIAGNOSIS — M069 Rheumatoid arthritis, unspecified: Secondary | ICD-10-CM | POA: Diagnosis not present

## 2022-08-03 DIAGNOSIS — Z87891 Personal history of nicotine dependence: Secondary | ICD-10-CM | POA: Diagnosis not present

## 2022-08-03 DIAGNOSIS — F32A Depression, unspecified: Secondary | ICD-10-CM | POA: Diagnosis not present

## 2022-08-03 DIAGNOSIS — I2510 Atherosclerotic heart disease of native coronary artery without angina pectoris: Secondary | ICD-10-CM | POA: Diagnosis not present

## 2022-08-03 DIAGNOSIS — G8929 Other chronic pain: Secondary | ICD-10-CM | POA: Diagnosis not present

## 2022-08-03 DIAGNOSIS — T8141XD Infection following a procedure, superficial incisional surgical site, subsequent encounter: Secondary | ICD-10-CM | POA: Diagnosis not present

## 2022-08-03 DIAGNOSIS — I5022 Chronic systolic (congestive) heart failure: Secondary | ICD-10-CM | POA: Diagnosis not present

## 2022-08-03 DIAGNOSIS — M1990 Unspecified osteoarthritis, unspecified site: Secondary | ICD-10-CM | POA: Diagnosis not present

## 2022-08-03 DIAGNOSIS — E1151 Type 2 diabetes mellitus with diabetic peripheral angiopathy without gangrene: Secondary | ICD-10-CM | POA: Diagnosis not present

## 2022-08-03 DIAGNOSIS — E785 Hyperlipidemia, unspecified: Secondary | ICD-10-CM | POA: Diagnosis not present

## 2022-08-03 DIAGNOSIS — M545 Low back pain, unspecified: Secondary | ICD-10-CM | POA: Diagnosis not present

## 2022-08-03 NOTE — Telephone Encounter (Signed)
Copied from [place] [id number]. Topic: General - Other >> Aug 03, 2022 12:35 PM [name] wrote: Home Health orders, plan of care and wound care orders are being addressed to [name] Unable to accept wound care orders filled out by Daneil Dan because it was inially addressed to Dr. Brita Romp. Will forward orders to address [name] [name] please document when you receive new orders

## 2022-08-07 DIAGNOSIS — S81801A Unspecified open wound, right lower leg, initial encounter: Secondary | ICD-10-CM | POA: Diagnosis not present

## 2022-08-07 DIAGNOSIS — T8131XA Disruption of external operation (surgical) wound, not elsewhere classified, initial encounter: Secondary | ICD-10-CM | POA: Diagnosis not present

## 2022-08-08 DIAGNOSIS — M19111 Post-traumatic osteoarthritis, right shoulder: Secondary | ICD-10-CM | POA: Diagnosis not present

## 2022-08-08 DIAGNOSIS — M19112 Post-traumatic osteoarthritis, left shoulder: Secondary | ICD-10-CM | POA: Diagnosis not present

## 2022-08-10 DIAGNOSIS — I152 Hypertension secondary to endocrine disorders: Secondary | ICD-10-CM | POA: Diagnosis not present

## 2022-08-10 DIAGNOSIS — G8929 Other chronic pain: Secondary | ICD-10-CM | POA: Diagnosis not present

## 2022-08-10 DIAGNOSIS — Z9181 History of falling: Secondary | ICD-10-CM | POA: Diagnosis not present

## 2022-08-10 DIAGNOSIS — I2510 Atherosclerotic heart disease of native coronary artery without angina pectoris: Secondary | ICD-10-CM | POA: Diagnosis not present

## 2022-08-10 DIAGNOSIS — I110 Hypertensive heart disease with heart failure: Secondary | ICD-10-CM | POA: Diagnosis not present

## 2022-08-10 DIAGNOSIS — E1151 Type 2 diabetes mellitus with diabetic peripheral angiopathy without gangrene: Secondary | ICD-10-CM | POA: Diagnosis not present

## 2022-08-10 DIAGNOSIS — Z7984 Long term (current) use of oral hypoglycemic drugs: Secondary | ICD-10-CM | POA: Diagnosis not present

## 2022-08-10 DIAGNOSIS — Z87891 Personal history of nicotine dependence: Secondary | ICD-10-CM | POA: Diagnosis not present

## 2022-08-10 DIAGNOSIS — M1990 Unspecified osteoarthritis, unspecified site: Secondary | ICD-10-CM | POA: Diagnosis not present

## 2022-08-10 DIAGNOSIS — M797 Fibromyalgia: Secondary | ICD-10-CM | POA: Diagnosis not present

## 2022-08-10 DIAGNOSIS — E1159 Type 2 diabetes mellitus with other circulatory complications: Secondary | ICD-10-CM | POA: Diagnosis not present

## 2022-08-10 DIAGNOSIS — Z96652 Presence of left artificial knee joint: Secondary | ICD-10-CM | POA: Diagnosis not present

## 2022-08-10 DIAGNOSIS — F32A Depression, unspecified: Secondary | ICD-10-CM | POA: Diagnosis not present

## 2022-08-10 DIAGNOSIS — T8131XD Disruption of external operation (surgical) wound, not elsewhere classified, subsequent encounter: Secondary | ICD-10-CM | POA: Diagnosis not present

## 2022-08-10 DIAGNOSIS — E785 Hyperlipidemia, unspecified: Secondary | ICD-10-CM | POA: Diagnosis not present

## 2022-08-10 DIAGNOSIS — I878 Other specified disorders of veins: Secondary | ICD-10-CM | POA: Diagnosis not present

## 2022-08-10 DIAGNOSIS — M069 Rheumatoid arthritis, unspecified: Secondary | ICD-10-CM | POA: Diagnosis not present

## 2022-08-10 DIAGNOSIS — I5022 Chronic systolic (congestive) heart failure: Secondary | ICD-10-CM | POA: Diagnosis not present

## 2022-08-10 DIAGNOSIS — I872 Venous insufficiency (chronic) (peripheral): Secondary | ICD-10-CM | POA: Diagnosis not present

## 2022-08-10 DIAGNOSIS — M48061 Spinal stenosis, lumbar region without neurogenic claudication: Secondary | ICD-10-CM | POA: Diagnosis not present

## 2022-08-10 DIAGNOSIS — T8141XD Infection following a procedure, superficial incisional surgical site, subsequent encounter: Secondary | ICD-10-CM | POA: Diagnosis not present

## 2022-08-10 DIAGNOSIS — M545 Low back pain, unspecified: Secondary | ICD-10-CM | POA: Diagnosis not present

## 2022-08-15 DIAGNOSIS — Z7984 Long term (current) use of oral hypoglycemic drugs: Secondary | ICD-10-CM | POA: Diagnosis not present

## 2022-08-15 DIAGNOSIS — M1990 Unspecified osteoarthritis, unspecified site: Secondary | ICD-10-CM | POA: Diagnosis not present

## 2022-08-15 DIAGNOSIS — F32A Depression, unspecified: Secondary | ICD-10-CM | POA: Diagnosis not present

## 2022-08-15 DIAGNOSIS — Z87891 Personal history of nicotine dependence: Secondary | ICD-10-CM | POA: Diagnosis not present

## 2022-08-15 DIAGNOSIS — M797 Fibromyalgia: Secondary | ICD-10-CM | POA: Diagnosis not present

## 2022-08-15 DIAGNOSIS — E1151 Type 2 diabetes mellitus with diabetic peripheral angiopathy without gangrene: Secondary | ICD-10-CM | POA: Diagnosis not present

## 2022-08-15 DIAGNOSIS — G8929 Other chronic pain: Secondary | ICD-10-CM | POA: Diagnosis not present

## 2022-08-15 DIAGNOSIS — I5022 Chronic systolic (congestive) heart failure: Secondary | ICD-10-CM | POA: Diagnosis not present

## 2022-08-15 DIAGNOSIS — E785 Hyperlipidemia, unspecified: Secondary | ICD-10-CM | POA: Diagnosis not present

## 2022-08-15 DIAGNOSIS — E1159 Type 2 diabetes mellitus with other circulatory complications: Secondary | ICD-10-CM | POA: Diagnosis not present

## 2022-08-15 DIAGNOSIS — I110 Hypertensive heart disease with heart failure: Secondary | ICD-10-CM | POA: Diagnosis not present

## 2022-08-15 DIAGNOSIS — M069 Rheumatoid arthritis, unspecified: Secondary | ICD-10-CM | POA: Diagnosis not present

## 2022-08-15 DIAGNOSIS — I878 Other specified disorders of veins: Secondary | ICD-10-CM | POA: Diagnosis not present

## 2022-08-15 DIAGNOSIS — T8141XD Infection following a procedure, superficial incisional surgical site, subsequent encounter: Secondary | ICD-10-CM | POA: Diagnosis not present

## 2022-08-15 DIAGNOSIS — Z96652 Presence of left artificial knee joint: Secondary | ICD-10-CM | POA: Diagnosis not present

## 2022-08-15 DIAGNOSIS — M545 Low back pain, unspecified: Secondary | ICD-10-CM | POA: Diagnosis not present

## 2022-08-15 DIAGNOSIS — I2510 Atherosclerotic heart disease of native coronary artery without angina pectoris: Secondary | ICD-10-CM | POA: Diagnosis not present

## 2022-08-15 DIAGNOSIS — M48061 Spinal stenosis, lumbar region without neurogenic claudication: Secondary | ICD-10-CM | POA: Diagnosis not present

## 2022-08-15 DIAGNOSIS — T8131XD Disruption of external operation (surgical) wound, not elsewhere classified, subsequent encounter: Secondary | ICD-10-CM | POA: Diagnosis not present

## 2022-08-15 DIAGNOSIS — I872 Venous insufficiency (chronic) (peripheral): Secondary | ICD-10-CM | POA: Diagnosis not present

## 2022-08-15 DIAGNOSIS — Z9181 History of falling: Secondary | ICD-10-CM | POA: Diagnosis not present

## 2022-08-15 DIAGNOSIS — I152 Hypertension secondary to endocrine disorders: Secondary | ICD-10-CM | POA: Diagnosis not present

## 2022-08-22 ENCOUNTER — Other Ambulatory Visit: Payer: Self-pay

## 2022-08-22 DIAGNOSIS — I110 Hypertensive heart disease with heart failure: Secondary | ICD-10-CM | POA: Diagnosis not present

## 2022-08-22 DIAGNOSIS — M545 Low back pain, unspecified: Secondary | ICD-10-CM | POA: Diagnosis not present

## 2022-08-22 DIAGNOSIS — Z7984 Long term (current) use of oral hypoglycemic drugs: Secondary | ICD-10-CM | POA: Diagnosis not present

## 2022-08-22 DIAGNOSIS — T8131XD Disruption of external operation (surgical) wound, not elsewhere classified, subsequent encounter: Secondary | ICD-10-CM | POA: Diagnosis not present

## 2022-08-22 DIAGNOSIS — G8929 Other chronic pain: Secondary | ICD-10-CM | POA: Diagnosis not present

## 2022-08-22 DIAGNOSIS — I872 Venous insufficiency (chronic) (peripheral): Secondary | ICD-10-CM | POA: Diagnosis not present

## 2022-08-22 DIAGNOSIS — Z9181 History of falling: Secondary | ICD-10-CM | POA: Diagnosis not present

## 2022-08-22 DIAGNOSIS — T8141XD Infection following a procedure, superficial incisional surgical site, subsequent encounter: Secondary | ICD-10-CM | POA: Diagnosis not present

## 2022-08-22 DIAGNOSIS — I878 Other specified disorders of veins: Secondary | ICD-10-CM | POA: Diagnosis not present

## 2022-08-22 DIAGNOSIS — M797 Fibromyalgia: Secondary | ICD-10-CM | POA: Diagnosis not present

## 2022-08-22 DIAGNOSIS — I2510 Atherosclerotic heart disease of native coronary artery without angina pectoris: Secondary | ICD-10-CM | POA: Diagnosis not present

## 2022-08-22 DIAGNOSIS — Z87891 Personal history of nicotine dependence: Secondary | ICD-10-CM | POA: Diagnosis not present

## 2022-08-22 DIAGNOSIS — E1151 Type 2 diabetes mellitus with diabetic peripheral angiopathy without gangrene: Secondary | ICD-10-CM | POA: Diagnosis not present

## 2022-08-22 DIAGNOSIS — F32A Depression, unspecified: Secondary | ICD-10-CM | POA: Diagnosis not present

## 2022-08-22 DIAGNOSIS — E785 Hyperlipidemia, unspecified: Secondary | ICD-10-CM | POA: Diagnosis not present

## 2022-08-22 DIAGNOSIS — I5022 Chronic systolic (congestive) heart failure: Secondary | ICD-10-CM | POA: Diagnosis not present

## 2022-08-22 DIAGNOSIS — E1159 Type 2 diabetes mellitus with other circulatory complications: Secondary | ICD-10-CM | POA: Diagnosis not present

## 2022-08-22 DIAGNOSIS — Z96652 Presence of left artificial knee joint: Secondary | ICD-10-CM | POA: Diagnosis not present

## 2022-08-22 DIAGNOSIS — M48061 Spinal stenosis, lumbar region without neurogenic claudication: Secondary | ICD-10-CM | POA: Diagnosis not present

## 2022-08-22 DIAGNOSIS — I152 Hypertension secondary to endocrine disorders: Secondary | ICD-10-CM | POA: Diagnosis not present

## 2022-08-22 DIAGNOSIS — M069 Rheumatoid arthritis, unspecified: Secondary | ICD-10-CM | POA: Diagnosis not present

## 2022-08-22 DIAGNOSIS — M1990 Unspecified osteoarthritis, unspecified site: Secondary | ICD-10-CM | POA: Diagnosis not present

## 2022-08-22 MED ORDER — CARVEDILOL 6.25 MG PO TABS: 6.2500 mg | ORAL_TABLET | Freq: Two times a day (BID) | ORAL | 0 refills | Status: DC

## 2022-08-22 MED ORDER — PREGABALIN 75 MG PO CAPS
75.0000 mg | ORAL_CAPSULE | Freq: Three times a day (TID) | ORAL | 0 refills | Status: DC
Start: 2022-08-22 — End: 2022-09-13

## 2022-08-22 MED ORDER — METFORMIN HCL 500 MG PO TABS: ORAL_TABLET | ORAL | 0 refills | Status: DC

## 2022-08-22 MED ORDER — LOSARTAN POTASSIUM 25 MG PO TABS: 25.0000 mg | ORAL_TABLET | Freq: Every day | ORAL | 0 refills | Status: DC

## 2022-08-22 NOTE — Telephone Encounter (Signed)
Reviewed patient meds - has refills on some of her medications at local pharmacy, recommend checking with pharmacy. Refilled the meds that are due and were previously sent to mail order pharmacy.

## 2022-08-22 NOTE — Telephone Encounter (Signed)
Copied from [place] [id number]. Topic: General - Other >> Aug 22, 2022  2:02 PM Ja-Kwan M wrote: Reason for CRM: Pt reports that she changed her insurance plan and will need all her prescriptions to be switched from [place] [id number] [place]: [phone] Fax: [phone] Pt stated her new insurance requires her to get her prescriptions from the local pharmacy. Pt did not have the list of all her medications at the time and just requested that [facility].

## 2022-08-24 DIAGNOSIS — M069 Rheumatoid arthritis, unspecified: Secondary | ICD-10-CM | POA: Diagnosis not present

## 2022-08-24 DIAGNOSIS — I5022 Chronic systolic (congestive) heart failure: Secondary | ICD-10-CM | POA: Diagnosis not present

## 2022-08-24 DIAGNOSIS — I110 Hypertensive heart disease with heart failure: Secondary | ICD-10-CM | POA: Diagnosis not present

## 2022-08-24 DIAGNOSIS — I878 Other specified disorders of veins: Secondary | ICD-10-CM | POA: Diagnosis not present

## 2022-08-24 DIAGNOSIS — F32A Depression, unspecified: Secondary | ICD-10-CM | POA: Diagnosis not present

## 2022-08-24 DIAGNOSIS — G8929 Other chronic pain: Secondary | ICD-10-CM | POA: Diagnosis not present

## 2022-08-24 DIAGNOSIS — Z96652 Presence of left artificial knee joint: Secondary | ICD-10-CM | POA: Diagnosis not present

## 2022-08-24 DIAGNOSIS — E1151 Type 2 diabetes mellitus with diabetic peripheral angiopathy without gangrene: Secondary | ICD-10-CM | POA: Diagnosis not present

## 2022-08-24 DIAGNOSIS — I872 Venous insufficiency (chronic) (peripheral): Secondary | ICD-10-CM | POA: Diagnosis not present

## 2022-08-24 DIAGNOSIS — M797 Fibromyalgia: Secondary | ICD-10-CM | POA: Diagnosis not present

## 2022-08-24 DIAGNOSIS — Z87891 Personal history of nicotine dependence: Secondary | ICD-10-CM | POA: Diagnosis not present

## 2022-08-24 DIAGNOSIS — E785 Hyperlipidemia, unspecified: Secondary | ICD-10-CM | POA: Diagnosis not present

## 2022-08-24 DIAGNOSIS — M1990 Unspecified osteoarthritis, unspecified site: Secondary | ICD-10-CM | POA: Diagnosis not present

## 2022-08-24 DIAGNOSIS — Z9181 History of falling: Secondary | ICD-10-CM | POA: Diagnosis not present

## 2022-08-24 DIAGNOSIS — I2510 Atherosclerotic heart disease of native coronary artery without angina pectoris: Secondary | ICD-10-CM | POA: Diagnosis not present

## 2022-08-24 DIAGNOSIS — M545 Low back pain, unspecified: Secondary | ICD-10-CM | POA: Diagnosis not present

## 2022-08-24 DIAGNOSIS — M48061 Spinal stenosis, lumbar region without neurogenic claudication: Secondary | ICD-10-CM | POA: Diagnosis not present

## 2022-08-24 DIAGNOSIS — T8131XD Disruption of external operation (surgical) wound, not elsewhere classified, subsequent encounter: Secondary | ICD-10-CM | POA: Diagnosis not present

## 2022-08-24 DIAGNOSIS — Z7984 Long term (current) use of oral hypoglycemic drugs: Secondary | ICD-10-CM | POA: Diagnosis not present

## 2022-08-24 DIAGNOSIS — I152 Hypertension secondary to endocrine disorders: Secondary | ICD-10-CM | POA: Diagnosis not present

## 2022-08-24 DIAGNOSIS — E1159 Type 2 diabetes mellitus with other circulatory complications: Secondary | ICD-10-CM | POA: Diagnosis not present

## 2022-08-24 DIAGNOSIS — T8141XD Infection following a procedure, superficial incisional surgical site, subsequent encounter: Secondary | ICD-10-CM | POA: Diagnosis not present

## 2022-08-28 DIAGNOSIS — F32A Depression, unspecified: Secondary | ICD-10-CM | POA: Diagnosis not present

## 2022-08-28 DIAGNOSIS — Z9181 History of falling: Secondary | ICD-10-CM | POA: Diagnosis not present

## 2022-08-28 DIAGNOSIS — Z96652 Presence of left artificial knee joint: Secondary | ICD-10-CM | POA: Diagnosis not present

## 2022-08-28 DIAGNOSIS — M1990 Unspecified osteoarthritis, unspecified site: Secondary | ICD-10-CM | POA: Diagnosis not present

## 2022-08-28 DIAGNOSIS — E1151 Type 2 diabetes mellitus with diabetic peripheral angiopathy without gangrene: Secondary | ICD-10-CM | POA: Diagnosis not present

## 2022-08-28 DIAGNOSIS — Z7984 Long term (current) use of oral hypoglycemic drugs: Secondary | ICD-10-CM | POA: Diagnosis not present

## 2022-08-28 DIAGNOSIS — I872 Venous insufficiency (chronic) (peripheral): Secondary | ICD-10-CM | POA: Diagnosis not present

## 2022-08-28 DIAGNOSIS — E785 Hyperlipidemia, unspecified: Secondary | ICD-10-CM | POA: Diagnosis not present

## 2022-08-28 DIAGNOSIS — I5022 Chronic systolic (congestive) heart failure: Secondary | ICD-10-CM | POA: Diagnosis not present

## 2022-08-28 DIAGNOSIS — I110 Hypertensive heart disease with heart failure: Secondary | ICD-10-CM | POA: Diagnosis not present

## 2022-08-28 DIAGNOSIS — G8929 Other chronic pain: Secondary | ICD-10-CM | POA: Diagnosis not present

## 2022-08-28 DIAGNOSIS — M797 Fibromyalgia: Secondary | ICD-10-CM | POA: Diagnosis not present

## 2022-08-28 DIAGNOSIS — M48061 Spinal stenosis, lumbar region without neurogenic claudication: Secondary | ICD-10-CM | POA: Diagnosis not present

## 2022-08-28 DIAGNOSIS — I152 Hypertension secondary to endocrine disorders: Secondary | ICD-10-CM | POA: Diagnosis not present

## 2022-08-28 DIAGNOSIS — I2510 Atherosclerotic heart disease of native coronary artery without angina pectoris: Secondary | ICD-10-CM | POA: Diagnosis not present

## 2022-08-28 DIAGNOSIS — M069 Rheumatoid arthritis, unspecified: Secondary | ICD-10-CM | POA: Diagnosis not present

## 2022-08-28 DIAGNOSIS — M545 Low back pain, unspecified: Secondary | ICD-10-CM | POA: Diagnosis not present

## 2022-08-28 DIAGNOSIS — E1159 Type 2 diabetes mellitus with other circulatory complications: Secondary | ICD-10-CM | POA: Diagnosis not present

## 2022-08-28 DIAGNOSIS — Z87891 Personal history of nicotine dependence: Secondary | ICD-10-CM | POA: Diagnosis not present

## 2022-08-28 DIAGNOSIS — I878 Other specified disorders of veins: Secondary | ICD-10-CM | POA: Diagnosis not present

## 2022-08-30 DIAGNOSIS — M259 Joint disorder, unspecified: Secondary | ICD-10-CM | POA: Insufficient documentation

## 2022-08-30 DIAGNOSIS — M19112 Post-traumatic osteoarthritis, left shoulder: Secondary | ICD-10-CM | POA: Diagnosis not present

## 2022-08-30 DIAGNOSIS — M19111 Post-traumatic osteoarthritis, right shoulder: Secondary | ICD-10-CM | POA: Diagnosis not present

## 2022-09-06 DIAGNOSIS — M069 Rheumatoid arthritis, unspecified: Secondary | ICD-10-CM | POA: Diagnosis not present

## 2022-09-06 DIAGNOSIS — I878 Other specified disorders of veins: Secondary | ICD-10-CM | POA: Diagnosis not present

## 2022-09-06 DIAGNOSIS — Z96652 Presence of left artificial knee joint: Secondary | ICD-10-CM | POA: Diagnosis not present

## 2022-09-06 DIAGNOSIS — Z7984 Long term (current) use of oral hypoglycemic drugs: Secondary | ICD-10-CM | POA: Diagnosis not present

## 2022-09-06 DIAGNOSIS — I110 Hypertensive heart disease with heart failure: Secondary | ICD-10-CM | POA: Diagnosis not present

## 2022-09-06 DIAGNOSIS — E1151 Type 2 diabetes mellitus with diabetic peripheral angiopathy without gangrene: Secondary | ICD-10-CM | POA: Diagnosis not present

## 2022-09-06 DIAGNOSIS — M797 Fibromyalgia: Secondary | ICD-10-CM | POA: Diagnosis not present

## 2022-09-06 DIAGNOSIS — G8929 Other chronic pain: Secondary | ICD-10-CM | POA: Diagnosis not present

## 2022-09-06 DIAGNOSIS — M48061 Spinal stenosis, lumbar region without neurogenic claudication: Secondary | ICD-10-CM | POA: Diagnosis not present

## 2022-09-06 DIAGNOSIS — I152 Hypertension secondary to endocrine disorders: Secondary | ICD-10-CM | POA: Diagnosis not present

## 2022-09-06 DIAGNOSIS — M545 Low back pain, unspecified: Secondary | ICD-10-CM | POA: Diagnosis not present

## 2022-09-06 DIAGNOSIS — I2510 Atherosclerotic heart disease of native coronary artery without angina pectoris: Secondary | ICD-10-CM | POA: Diagnosis not present

## 2022-09-06 DIAGNOSIS — E1159 Type 2 diabetes mellitus with other circulatory complications: Secondary | ICD-10-CM | POA: Diagnosis not present

## 2022-09-06 DIAGNOSIS — E785 Hyperlipidemia, unspecified: Secondary | ICD-10-CM | POA: Diagnosis not present

## 2022-09-06 DIAGNOSIS — Z87891 Personal history of nicotine dependence: Secondary | ICD-10-CM | POA: Diagnosis not present

## 2022-09-06 DIAGNOSIS — Z9181 History of falling: Secondary | ICD-10-CM | POA: Diagnosis not present

## 2022-09-06 DIAGNOSIS — M1990 Unspecified osteoarthritis, unspecified site: Secondary | ICD-10-CM | POA: Diagnosis not present

## 2022-09-06 DIAGNOSIS — I5022 Chronic systolic (congestive) heart failure: Secondary | ICD-10-CM | POA: Diagnosis not present

## 2022-09-06 DIAGNOSIS — I872 Venous insufficiency (chronic) (peripheral): Secondary | ICD-10-CM | POA: Diagnosis not present

## 2022-09-06 DIAGNOSIS — F32A Depression, unspecified: Secondary | ICD-10-CM | POA: Diagnosis not present

## 2022-09-09 NOTE — Progress Notes (Deleted)
Psychiatric Initial Adult Assessment   Patient Identification: [name] [name] MRN:  [id number] Date of Evaluation:  09/09/2022 Referral Source: *** Chief Complaint:  No chief complaint on file.  Visit Diagnosis: No diagnosis found.  History of Present Illness:   ELDONNA NEUENFELDT is a 71 y.o. year old female with a history of depression, fybromyalgia,  HFrEF (EF 25-30%), CAD s/p PCI, diabetes, hypertension, hyperlipidemia, obesity, , previous uterine cancer , who is referred for depression.    Daily routine: Diet:  Exercise: Support: Household:  Marital status: Number of children: Employment:  Education:   Last PCP / ongoing medical evaluation:      Associated Signs/Symptoms: Depression Symptoms:  {DEPRESSION SYMPTOMS:20000} (Hypo) Manic Symptoms:  {BHH MANIC SYMPTOMS:22872} Anxiety Symptoms:  {BHH ANXIETY SYMPTOMS:22873} Psychotic Symptoms:  {BHH PSYCHOTIC SYMPTOMS:22874} PTSD Symptoms: {BHH PTSD SYMPTOMS:22875}  Past Psychiatric History:  Outpatient:  Psychiatry admission:  Previous suicide attempt:  Past trials of medication:  History of violence:    Previous Psychotropic Medications: {YES/NO:21197}  Substance Abuse History in the last 12 months:  {yes no:314532}  Consequences of Substance Abuse: {BHH CONSEQUENCES OF SUBSTANCE ABUSE:22880}  Past Medical History:  Past Medical History:  Diagnosis Date   Anxiety    Arthritis    Rhumetoid arthritis   BRCA negative 07/2017   MyRisk neg   Cancer ([name])    uterine   Cellulitis of both lower extremities    Chronic   CHF (congestive heart failure) ([place])    Coronary artery disease    Depression    Diabetes mellitus without complication ([place])    Family history of breast cancer 07/2017   MyRisk neg; IBIS=10.5%/riskscore=17%   Family history of ovarian cancer    Fibromyalgia    Fibromyalgia affecting shoulder region    Headache    Hyperlipidemia    Hypertension    Spinal stenosis     Past Surgical History:   Procedure Laterality Date   ABLATION SAPHENOUS VEIN W/ RFA     ANAL FISSURECTOMY  01/2004   COLON SURGERY     CORONARY ANGIOPLASTY     CYSTOSCOPY  09/03/2017   Procedure: CYSTOSCOPY;  Surgeon: Gae Dry, MD;  Location: [facility];  Service: Gynecology;;   DILATION AND CURETTAGE OF UTERUS     ESOPHAGEAL DILATION  02/20/2022   Procedure: ESOPHAGEAL DILATION;  Surgeon: Carol Ada, MD;  Location: [facility];  Service: Gastroenterology;;  savory   ESOPHAGOGASTRODUODENOSCOPY (EGD) WITH PROPOFOL N/A 02/20/2022   Procedure: ESOPHAGOGASTRODUODENOSCOPY (EGD) WITH PROPOFOL;  Surgeon: Carol Ada, MD;  Location: [facility];  Service: Gastroenterology;  Laterality: N/A;  Dyspahgia   EYE SURGERY Bilateral 08/2020   eye lift   HERNIA REPAIR     INCISION AND DRAINAGE OF WOUND  03/04/2022   Procedure: IRRIGATION AND DEBRIDEMENT ABDOMEN WITH VAC PLACEMENT, MYRIAD PLACEMENT;  Surgeon: Wallace Going, DO;  Location: [facility];  Service: Plastics;;   LAPAROSCOPIC HYSTERECTOMY N/A 09/03/2017   Procedure: HYSTERECTOMY TOTAL LAPAROSCOPIC BSO;  Surgeon: Gae Dry, MD;  Location: [facility];  Service: Gynecology;  Laterality: N/A;   MEDIAL PARTIAL KNEE REPLACEMENT Left 01/06/2014   PANNICULECTOMY N/A 02/05/2022   Procedure: PANNICULECTOMY;  Surgeon: Wallace Going, DO;  Location: [facility];  Service: Plastics;  Laterality: N/A;  3 hours   UMBILICAL HERNIA REPAIR N/A 09/05/2015   Procedure: HERNIA REPAIR incarcerated UMBILICAL ADULT;  Surgeon: Sherri Rad, MD;  Location: [facility];  Service: General;  Laterality: N/A;   Vascular Stent  11/04/2013  Family Psychiatric History: ***  Family History:  Family History  Problem Relation Age of Onset   Hypertension Mother    Cataracts Mother    Thyroid disease Mother    Dementia Mother    COPD Father    Dementia Father    Cataracts Father    Aneurysm Father        Abdominal & Brain   Diabetes Father    Melanoma Father    Breast cancer Sister  89   Fibromyalgia Sister    Migraines Sister    Hypertension Sister    Breast cancer Sister 39   COPD Sister    Ovarian cancer Sister 32   Migraines Brother    Heart attack Brother    Colon cancer Paternal Uncle        10s   Colon cancer Paternal Uncle        107s   Uterine cancer Cousin    Uterine cancer Cousin     Social History:   Social History   Socioeconomic History   Marital status: Widowed    Spouse name: Shanon Brow   Number of children: 1   Years of education: 9th Grade   Highest education level: 9th grade  Occupational History   Occupation: Disabled    Comment: no longer receiving   Occupation: on SS  Tobacco Use   Smoking status: Former    Packs/day: 1.00    Years: 29.00    Total pack years: 29.00    Types: Cigarettes    Quit date: 12/03/1996    Years since quitting: 25.7   Smokeless tobacco: Never  Vaping Use   Vaping Use: Never used  Substance and Sexual Activity   Alcohol use: No   Drug use: No   Sexual activity: Not Currently  Other Topics Concern   Not on file  Social History Narrative   Not on file   Social Determinants of Health   Financial Resource Strain: High Risk (06/19/2022)   Overall Financial Resource Strain (CARDIA)    Difficulty of Paying Living Expenses: Very hard  Food Insecurity: No Food Insecurity (06/19/2022)   Hunger Vital Sign    Worried About Running Out of Food in the Last Year: Never true    Ran Out of Food in the Last Year: Never true  Transportation Needs: Unmet Transportation Needs (06/19/2022)   PRAPARE - Hydrologist (Medical): Yes    Lack of Transportation (Non-Medical): Not on file  Physical Activity: Sufficiently Active (02/13/2021)   Exercise Vital Sign    Days of Exercise per Week: 7 days    Minutes of Exercise per Session: 30 min  Stress: Stress Concern Present (02/13/2021)   [place]    Feeling of Stress : Rather much   Social Connections: Socially Isolated (06/19/2022)   Social Connection and Isolation Panel [NHANES]    Frequency of Communication with Friends and Family: More than three times a week    Frequency of Social Gatherings with Friends and Family: More than three times a week    Attends Religious Services: Never    Marine scientist or Organizations: No    Attends Archivist Meetings: Never    Marital Status: Widowed    Additional Social History: ***  Allergies:   Allergies  Allergen Reactions   Sulfa Antibiotics Itching, Rash and Swelling   Latex Itching    Other reaction(s): angioedema   Doxycycline Rash  Empagliflozin Itching and Other (See Comments)    Itching and frequent yeast infections    Metabolic Disorder Labs: Lab Results  Component Value Date   HGBA1C 7.4 (H) 03/03/2022   MPG 165.68 03/03/2022   MPG 148.46 01/30/2022   No results found for: "PROLACTIN" Lab Results  Component Value Date   CHOL 238 (H) 04/19/2022   TRIG 153 (H) 04/19/2022   HDL 51 04/19/2022   CHOLHDL 4.4 10/24/2021   VLDL 50.8 (H) 12/13/2016   LDLCALC 159 (H) 04/19/2022   LDLCALC 122 (H) 10/24/2021   Lab Results  Component Value Date   TSH 2.100 07/04/2022    Therapeutic Level Labs: No results found for: "LITHIUM" No results found for: "CBMZ" No results found for: "VALPROATE"  Current Medications: Current Outpatient Medications  Medication Sig Dispense Refill   Biotin w/ Vitamins C & E (HAIR/SKIN/NAILS PO) Take 1 tablet by mouth daily.     buPROPion 450 MG TB24 Take 450 mg by mouth daily. 90 tablet 1   carvedilol (COREG) 6.25 MG tablet Take 1 tablet (6.25 mg total) by mouth 2 (two) times daily. 180 tablet 0   DULoxetine (CYMBALTA) 60 MG capsule Take 1 capsule (60 mg total) by mouth daily. 30 capsule 2   ezetimibe (ZETIA) 10 MG tablet TAKE 1 TABLET BY MOUTH EVERY DAY (Patient taking differently: Take 10 mg by mouth daily.) 90 tablet 3   GEMTESA 75 MG TABS Take 1 tablet  by mouth daily.     Iron, Ferrous Sulfate, 325 (65 Fe) MG TABS Take 325 mg by mouth daily. 90 tablet 0   losartan (COZAAR) 25 MG tablet Take 1 tablet (25 mg total) by mouth daily. 90 tablet 0   meloxicam (MOBIC) 15 MG tablet TAKE 1 TABLET (15 MG TOTAL) BY MOUTH DAILY. 30 tablet 3   metFORMIN (GLUCOPHAGE) 500 MG tablet TAKE ONE TABLET BY MOUTH TWICE DAILY 180 tablet 0   polyethylene glycol (MIRALAX / GLYCOLAX) 17 g packet Take 17 g by mouth daily as needed for mild constipation. 14 each 0   pregabalin (LYRICA) 75 MG capsule Take 1 capsule (75 mg total) by mouth 3 (three) times daily. 90 capsule 0   tolterodine (DETROL LA) 4 MG 24 hr capsule Take 4 mg by mouth daily.     No current facility-administered medications for this visit.    Musculoskeletal: Strength & Muscle Tone: within normal limits Gait & Station: normal Patient leans: N/A  Psychiatric Specialty Exam: Review of Systems  There were no vitals taken for this visit.There is no height or weight on file to calculate BMI.  General Appearance: {Appearance:22683}  Eye Contact:  {BHH EYE CONTACT:22684}  Speech:  Clear and Coherent  Volume:  Normal  Mood:  {BHH MOOD:22306}  Affect:  {Affect (PAA):22687}  Thought Process:  Coherent  Orientation:  Full (Time, Place, and Person)  Thought Content:  Logical  Suicidal Thoughts:  {ST/HT (PAA):22692}  Homicidal Thoughts:  {ST/HT (PAA):22692}  Memory:  Immediate;   Good  Judgement:  {Judgement (PAA):22694}  Insight:  {Insight (PAA):22695}  Psychomotor Activity:  Normal  Concentration:  Concentration: Good and Attention Span: Good  Recall:  Good  Fund of Knowledge:Good  Language: Good  Akathisia:  No  Handed:  Right  AIMS (if indicated):  not done  Assets:  Communication Skills Desire for Improvement  ADL's:  Intact  Cognition: WNL  Sleep:  {BHH GOOD/FAIR/POOR:22877}   Screenings: GAD-7    Flowsheet Row Office Visit from 07/04/2022 in [facility]  Practice Video Visit from  05/31/2022 in [facility] Visit from 04/17/2021 in [facility]  Total GAD-7 Score '6 10 11      ' PHQ2-9    [place] Visit from 07/17/2022 in [facility] Visit from 07/04/2022 in [facility] Management from 06/19/2022 in [facility] Video Visit from 05/31/2022 in [facility] [facility] Visit from 04/19/2022 in [facility]  PHQ-2 Total Score 3 2 0 4 6  PHQ-9 Total Score 11 7 -- 11 [place] ED from 04/05/2022 in [facility] ED to Hosp-Admission (Discharged) from 03/13/2022 in [facility] ED to Hosp-Admission (Discharged) from 03/02/2022 in [facility]  C-SSRS RISK CATEGORY No Risk No Risk No Risk       Assessment and Plan:  Assessment  Plan   The patient demonstrates the following risk factors for suicide: Chronic risk factors for suicide include: {Chronic Risk Factors for STMHDQQ:22979892}. Acute risk factors for suicide include: {Acute Risk Factors for JJHERDE:08144818}. Protective factors for this patient include: {Protective Factors for Suicide HUDJ:49702637}. Considering these factors, the overall suicide risk at this point appears to be {Desc; low/moderate/high:110033}. Patient {ACTION; IS/IS CHY:85027741} appropriate for outpatient follow up.        Collaboration of Care: {BH OP Collaboration of Care:21014065}  Patient/Guardian was advised Release of Information must be obtained prior to any record release in order to collaborate their care with an outside provider. Patient/Guardian was advised if they have not already done so to contact the registration department to sign all necessary forms in order for [name] to release information regarding their care.   Consent: Patient/Guardian gives verbal consent for treatment and assignment of benefits for services  provided during this visit. Patient/Guardian expressed understanding and agreed to proceed.   Norman Clay, MD 10/8/202311:48 AM

## 2022-09-11 ENCOUNTER — Ambulatory Visit: Payer: Medicare Other | Admitting: Psychiatry

## 2022-09-12 ENCOUNTER — Telehealth: Payer: Self-pay | Admitting: Family Medicine

## 2022-09-12 ENCOUNTER — Other Ambulatory Visit: Payer: Self-pay | Admitting: Family Medicine

## 2022-09-13 DIAGNOSIS — I110 Hypertensive heart disease with heart failure: Secondary | ICD-10-CM | POA: Diagnosis not present

## 2022-09-13 DIAGNOSIS — M545 Low back pain, unspecified: Secondary | ICD-10-CM | POA: Diagnosis not present

## 2022-09-13 DIAGNOSIS — Z87891 Personal history of nicotine dependence: Secondary | ICD-10-CM | POA: Diagnosis not present

## 2022-09-13 DIAGNOSIS — Z9181 History of falling: Secondary | ICD-10-CM | POA: Diagnosis not present

## 2022-09-13 DIAGNOSIS — M797 Fibromyalgia: Secondary | ICD-10-CM | POA: Diagnosis not present

## 2022-09-13 DIAGNOSIS — I2510 Atherosclerotic heart disease of native coronary artery without angina pectoris: Secondary | ICD-10-CM | POA: Diagnosis not present

## 2022-09-13 DIAGNOSIS — I5022 Chronic systolic (congestive) heart failure: Secondary | ICD-10-CM | POA: Diagnosis not present

## 2022-09-13 DIAGNOSIS — M1990 Unspecified osteoarthritis, unspecified site: Secondary | ICD-10-CM | POA: Diagnosis not present

## 2022-09-13 DIAGNOSIS — I152 Hypertension secondary to endocrine disorders: Secondary | ICD-10-CM | POA: Diagnosis not present

## 2022-09-13 DIAGNOSIS — E785 Hyperlipidemia, unspecified: Secondary | ICD-10-CM | POA: Diagnosis not present

## 2022-09-13 DIAGNOSIS — M48061 Spinal stenosis, lumbar region without neurogenic claudication: Secondary | ICD-10-CM | POA: Diagnosis not present

## 2022-09-13 DIAGNOSIS — Z96652 Presence of left artificial knee joint: Secondary | ICD-10-CM | POA: Diagnosis not present

## 2022-09-13 DIAGNOSIS — F32A Depression, unspecified: Secondary | ICD-10-CM | POA: Diagnosis not present

## 2022-09-13 DIAGNOSIS — Z7984 Long term (current) use of oral hypoglycemic drugs: Secondary | ICD-10-CM | POA: Diagnosis not present

## 2022-09-13 DIAGNOSIS — I878 Other specified disorders of veins: Secondary | ICD-10-CM | POA: Diagnosis not present

## 2022-09-13 DIAGNOSIS — E1151 Type 2 diabetes mellitus with diabetic peripheral angiopathy without gangrene: Secondary | ICD-10-CM | POA: Diagnosis not present

## 2022-09-13 DIAGNOSIS — M069 Rheumatoid arthritis, unspecified: Secondary | ICD-10-CM | POA: Diagnosis not present

## 2022-09-13 DIAGNOSIS — E1159 Type 2 diabetes mellitus with other circulatory complications: Secondary | ICD-10-CM | POA: Diagnosis not present

## 2022-09-13 DIAGNOSIS — I872 Venous insufficiency (chronic) (peripheral): Secondary | ICD-10-CM | POA: Diagnosis not present

## 2022-09-13 DIAGNOSIS — G8929 Other chronic pain: Secondary | ICD-10-CM | POA: Diagnosis not present

## 2022-09-13 NOTE — Telephone Encounter (Signed)
Can we check with patient - looks like last Rx was for 450 mg. Need to know which she is taking (not both)

## 2022-09-13 NOTE — Telephone Encounter (Signed)
Requested medication (s) are due for refill today: yes  Requested medication (s) are on the active medication list: yes  Last refill:  08/22/22  Future visit scheduled: {yes  Notes to clinic:  Unable to refill per protocol, cannot delegate.      Requested Prescriptions  Pending Prescriptions Disp Refills   pregabalin (LYRICA) 75 MG capsule [Pharmacy Med Name: pregabalin 75 mg capsule] 90 capsule 0    Sig: TAKE ONE CAPSULE BY MOUTH THREE TIMES DAILY     Not Delegated - Neurology:  Anticonvulsants - Controlled - pregabalin Failed - 09/12/2022  3:03 PM      Failed - This refill cannot be delegated      Failed - Cr in normal range and within 360 days    Creatinine  Date Value Ref Range Status  08/31/2014 0.63 0.60 - 1.30 mg/dL Final   Creatinine, Ser  Date Value Ref Range Status  07/17/2022 1.45 (H) 0.57 - 1.00 mg/dL Final         Passed - Completed PHQ-2 or PHQ-9 in the last 360 days      Passed - Valid encounter within last 12 months    Recent Outpatient Visits           1 month ago Anemia, unspecified type   [place], DO   2 months ago Hair loss   [facility] Myles Gip, DO   3 months ago Financial insecurity   [place], Dionne Bucy, MD   4 months ago Encounter for annual wellness visit (AWV) in Medicare patient   [facility] [place], Dionne Bucy, MD   5 months ago Adhesive capsulitis of both shoulders   [facility] [place], Dionne Bucy, MD

## 2022-09-14 NOTE — Telephone Encounter (Signed)
Pt returned the call to make provider aware that she is currently taking 300 mg

## 2022-09-14 NOTE — Telephone Encounter (Signed)
So did we check and confirm that the 300 mg dose is the correct one that she is taking, not the 450 mg?

## 2022-09-14 NOTE — Telephone Encounter (Unsigned)
Called patient concerning dose. Patient stated she was not sure which dose she is taking. She is no longer using [facility]. [facility] (in [facility]). Patient states she does not need a refill right now. She will call back and let us know which dose she is taking.

## 2022-09-17 MED ORDER — BUPROPION HCL ER (XL) 300 MG PO TB24: 300.0000 mg | ORAL_TABLET | Freq: Every day | ORAL | 1 refills | Status: DC

## 2022-09-17 NOTE — Telephone Encounter (Signed)
Noted. Med list updated.

## 2022-09-20 DIAGNOSIS — Z7984 Long term (current) use of oral hypoglycemic drugs: Secondary | ICD-10-CM | POA: Diagnosis not present

## 2022-09-20 DIAGNOSIS — E1151 Type 2 diabetes mellitus with diabetic peripheral angiopathy without gangrene: Secondary | ICD-10-CM | POA: Diagnosis not present

## 2022-09-20 DIAGNOSIS — I5022 Chronic systolic (congestive) heart failure: Secondary | ICD-10-CM | POA: Diagnosis not present

## 2022-09-20 DIAGNOSIS — M797 Fibromyalgia: Secondary | ICD-10-CM | POA: Diagnosis not present

## 2022-09-20 DIAGNOSIS — M1990 Unspecified osteoarthritis, unspecified site: Secondary | ICD-10-CM | POA: Diagnosis not present

## 2022-09-20 DIAGNOSIS — E785 Hyperlipidemia, unspecified: Secondary | ICD-10-CM | POA: Diagnosis not present

## 2022-09-20 DIAGNOSIS — Z9181 History of falling: Secondary | ICD-10-CM | POA: Diagnosis not present

## 2022-09-20 DIAGNOSIS — G8929 Other chronic pain: Secondary | ICD-10-CM | POA: Diagnosis not present

## 2022-09-20 DIAGNOSIS — M545 Low back pain, unspecified: Secondary | ICD-10-CM | POA: Diagnosis not present

## 2022-09-20 DIAGNOSIS — I152 Hypertension secondary to endocrine disorders: Secondary | ICD-10-CM | POA: Diagnosis not present

## 2022-09-20 DIAGNOSIS — Z96652 Presence of left artificial knee joint: Secondary | ICD-10-CM | POA: Diagnosis not present

## 2022-09-20 DIAGNOSIS — F32A Depression, unspecified: Secondary | ICD-10-CM | POA: Diagnosis not present

## 2022-09-20 DIAGNOSIS — M48061 Spinal stenosis, lumbar region without neurogenic claudication: Secondary | ICD-10-CM | POA: Diagnosis not present

## 2022-09-20 DIAGNOSIS — Z87891 Personal history of nicotine dependence: Secondary | ICD-10-CM | POA: Diagnosis not present

## 2022-09-20 DIAGNOSIS — M069 Rheumatoid arthritis, unspecified: Secondary | ICD-10-CM | POA: Diagnosis not present

## 2022-09-20 DIAGNOSIS — I110 Hypertensive heart disease with heart failure: Secondary | ICD-10-CM | POA: Diagnosis not present

## 2022-09-20 DIAGNOSIS — I878 Other specified disorders of veins: Secondary | ICD-10-CM | POA: Diagnosis not present

## 2022-09-20 DIAGNOSIS — E1159 Type 2 diabetes mellitus with other circulatory complications: Secondary | ICD-10-CM | POA: Diagnosis not present

## 2022-09-20 DIAGNOSIS — I872 Venous insufficiency (chronic) (peripheral): Secondary | ICD-10-CM | POA: Diagnosis not present

## 2022-09-20 DIAGNOSIS — I2510 Atherosclerotic heart disease of native coronary artery without angina pectoris: Secondary | ICD-10-CM | POA: Diagnosis not present

## 2022-10-08 NOTE — Telephone Encounter (Signed)
Copied from [place] [id number]. Topic: General - Other >> Sep 27, 2022  3:24 PM [name] wrote: Faxing over orders attention Tally Joe for home health orders to [facility] fax # [phone]. Please note when received. Caller states Tally Joe has been signing orders in PCP absence

## 2022-10-08 NOTE — Telephone Encounter (Signed)
Received and faxed. Documents under media

## 2022-10-19 ENCOUNTER — Other Ambulatory Visit: Payer: Self-pay | Admitting: Family Medicine

## 2022-11-07 DIAGNOSIS — M19012 Primary osteoarthritis, left shoulder: Secondary | ICD-10-CM | POA: Diagnosis not present

## 2022-11-09 DIAGNOSIS — R718 Other abnormality of red blood cells: Secondary | ICD-10-CM | POA: Diagnosis not present

## 2022-11-09 DIAGNOSIS — R748 Abnormal levels of other serum enzymes: Secondary | ICD-10-CM | POA: Diagnosis not present

## 2022-11-09 DIAGNOSIS — M19012 Primary osteoarthritis, left shoulder: Secondary | ICD-10-CM | POA: Diagnosis not present

## 2022-11-09 DIAGNOSIS — D696 Thrombocytopenia, unspecified: Secondary | ICD-10-CM | POA: Diagnosis not present

## 2022-11-09 DIAGNOSIS — R7309 Other abnormal glucose: Secondary | ICD-10-CM | POA: Diagnosis not present

## 2022-11-09 DIAGNOSIS — Z01812 Encounter for preprocedural laboratory examination: Secondary | ICD-10-CM | POA: Diagnosis not present

## 2022-11-16 ENCOUNTER — Other Ambulatory Visit: Payer: Self-pay | Admitting: Family Medicine

## 2022-12-12 ENCOUNTER — Other Ambulatory Visit: Payer: Self-pay | Admitting: Family Medicine

## 2022-12-12 NOTE — Telephone Encounter (Signed)
Requested medication (s) are due for refill today: yes  Requested medication (s) are on the active medication list: yes  Last refill:  09/13/22 #90 2 refills  Future visit scheduled: no   Notes to clinic:   not delegated per protocol. Do you want to refill Rx?     Requested Prescriptions  Pending Prescriptions Disp Refills   pregabalin (LYRICA) 75 MG capsule [Pharmacy Med Name: pregabalin 75 mg capsule] 90 capsule 0    Sig: TAKE ONE CAPSULE BY MOUTH THREE TIMES DAILY     Not Delegated - Neurology:  Anticonvulsants - Controlled - pregabalin Failed - 12/12/2022  4:46 PM      Failed - This refill cannot be delegated      Failed - Cr in normal range and within 360 days    Creatinine  Date Value Ref Range Status  08/31/2014 0.63 0.60 - 1.30 mg/dL Final   Creatinine, Ser  Date Value Ref Range Status  07/17/2022 1.45 (H) 0.57 - 1.00 mg/dL Final         Passed - Completed PHQ-2 or PHQ-9 in the last 360 days      Passed - Valid encounter within last 12 months    Recent Outpatient Visits           4 months ago Anemia, unspecified type   [place], [place], DO   5 months ago Hair loss   [facility] Myles Gip, DO   6 months ago Financial insecurity   [employer], Dionne Bucy, MD   7 months ago Encounter for annual wellness visit (AWV) in Medicare patient   [place], MD   8 months ago Adhesive capsulitis of both shoulders   [facility] [place], Dionne Bucy, MD       Future Appointments             In 3 weeks MacDiarmid, Nicki Reaper, [place]

## 2022-12-12 NOTE — Telephone Encounter (Signed)
Requested medication (s) are due for refill today: yes   Requested medication (s) are on the active medication list: yes   Last refill:  08/22/22 #180 0 refills  Future visit scheduled: no   Notes to clinic:   last ordered by A. Rumball, DO 08/22/22  . Do you want to refill Rx?     Requested Prescriptions  Pending Prescriptions Disp Refills   metFORMIN (GLUCOPHAGE) 500 MG tablet [Pharmacy Med Name: metformin 500 mg tablet] 180 tablet 0    Sig: TAKE ONE TABLET BY MOUTH TWICE DAILY     Endocrinology:  Diabetes - Biguanides Failed - 12/12/2022  4:19 PM      Failed - Cr in normal range and within 360 days    Creatinine  Date Value Ref Range Status  08/31/2014 0.63 0.60 - 1.30 mg/dL Final   Creatinine, Ser  Date Value Ref Range Status  07/17/2022 1.45 (H) 0.57 - 1.00 mg/dL Final         Failed - HBA1C is between 0 and 7.9 and within 180 days    Hgb A1c MFr Bld  Date Value Ref Range Status  03/03/2022 7.4 (H) 4.8 - 5.6 % Final    Comment:    (NOTE) Pre diabetes:          5.7%-6.4%  Diabetes:              >6.4%  Glycemic control for   <7.0% adults with diabetes          Failed - eGFR in normal range and within 360 days    EGFR (African American)  Date Value Ref Range Status  08/31/2014 >60 >41m/min Final  11/05/2013 >60  Final   GFR calc Af Amer  Date Value Ref Range Status  12/21/2020 72 >59 mL/min/1.73 Final    Comment:    **In accordance with recommendations from the NKF-ASN Task force,**   [facility] is in the process of updating its eGFR calculation to the   2021 CKD-EPI creatinine equation that estimates kidney function   without a race variable.    EGFR (Non-African Amer.)  Date Value Ref Range Status  08/31/2014 >60 >641mmin Final    Comment:    eGFR values <6041min/1.73 m2 may be an indication of chronic kidney disease (CKD). Calculated eGFR, using the MRDR Study equation, is useful in  patients with stable renal function. The eGFR calculation will  not be reliable in acutely ill patients when serum creatinine is changing rapidly. It is not useful in patients on dialysis. The eGFR calculation may not be applicable to patients at the low and high extremes of body sizes, pregnant women, and vegetarians.   11/05/2013 >60  Final    Comment:    eGFR values <80m19mn/1.73 m2 may be an indication of chronic kidney disease (CKD). Calculated eGFR is useful in patients with stable renal function. The eGFR calculation will not be reliable in acutely ill patients when serum creatinine is changing rapidly. It is not useful in  patients on dialysis. The eGFR calculation may not be applicable to patients at the low and high extremes of body sizes, pregnant women, and vegetarians.    GFR, Estimated  Date Value Ref Range Status  04/05/2022 >60 >60 mL/min Final    Comment:    (NOTE) Calculated using the CKD-EPI Creatinine Equation (2021)    GFR  Date Value Ref Range Status  12/13/2016 113.78 >60.00 mL/min Final   eGFR  Date Value Ref Range Status  07/17/2022  39 (L) >59 mL/min/1.73 Final         Passed - B12 Level in normal range and within 720 days    Vitamin B-12  Date Value Ref Range Status  07/17/2022 429 232 - 1,245 pg/mL Final         Passed - Valid encounter within last 6 months    Recent Outpatient Visits           4 months ago Anemia, unspecified type   [place], [place], DO   5 months ago Hair loss   [facility] Myles Gip, DO   6 months ago Financial insecurity   [facility] [place], Dionne Bucy, MD   7 months ago Encounter for annual wellness visit (AWV) in Medicare patient   [facility] [place], Dionne Bucy, MD   8 months ago Adhesive capsulitis of both shoulders   [facility] [place], Dionne Bucy, MD       Future Appointments             In 3 weeks MacDiarmid, Nicki Reaper, MD [facility] [place]             Passed - CBC within normal limits and completed in the last 12 months    WBC  Date Value Ref Range Status  07/17/2022 10.1 3.4 - 10.8 x10E3/uL Final  04/05/2022 5.6 4.0 - 10.5 K/uL Final   RBC  Date Value Ref Range Status  07/17/2022 4.47 3.77 - 5.28 x10E6/uL Final  04/05/2022 4.04 3.87 - 5.11 MIL/uL Final   Hemoglobin  Date Value Ref Range Status  07/17/2022 9.9 (L) 11.1 - 15.9 g/dL Final   Hematocrit  Date Value Ref Range Status  07/17/2022 31.8 (L) 34.0 - 46.6 % Final   MCHC  Date Value Ref Range Status  07/17/2022 31.1 (L) 31.5 - 35.7 g/dL Final  04/05/2022 28.9 (L) 30.0 - 36.0 g/dL Final   [facility]  Date Value Ref Range Status  07/17/2022 22.1 (L) 26.6 - 33.0 pg Final  04/05/2022 23.8 (L) 26.0 - 34.0 pg Final   MCV  Date Value Ref Range Status  07/17/2022 71 (L) 79 - 97 fL Final  08/31/2014 86 80 - 100 fL Final   No results found for: "PLTCOUNTKUC", "LABPLAT", "POCPLA" RDW  Date Value Ref Range Status  07/17/2022 17.1 (H) 11.7 - 15.4 % Final  08/31/2014 16.3 (H) 11.5 - 14.5 % Final

## 2022-12-14 NOTE — Telephone Encounter (Signed)
Please review. Last office visit 07/17/2022.  KP

## 2022-12-24 ENCOUNTER — Other Ambulatory Visit: Payer: Self-pay

## 2022-12-24 DIAGNOSIS — I152 Hypertension secondary to endocrine disorders: Secondary | ICD-10-CM

## 2022-12-24 DIAGNOSIS — F332 Major depressive disorder, recurrent severe without psychotic features: Secondary | ICD-10-CM

## 2022-12-24 DIAGNOSIS — E119 Type 2 diabetes mellitus without complications: Secondary | ICD-10-CM

## 2022-12-24 DIAGNOSIS — Z5986 Financial insecurity: Secondary | ICD-10-CM

## 2023-01-02 ENCOUNTER — Telehealth: Payer: Self-pay

## 2023-01-02 NOTE — Progress Notes (Unsigned)
Care Management & Coordination Services Pharmacy Team Pharmacy Assistant   Name: [name] [name]  MRN: [id number] DOB: [birth year]  Reason for Encounter: Appointment Reminder  Conditions to be addressed/monitored: CHF, HTN, DMII, Depression, Allergic Rhinitis, and Chronic Venous insufficiency, Atherosclerosis of coronary artery, Migraines, CAD, Senile Purpura, Chronic Bilateral low back pain with bilateral sciatica, Polyneuropathy associated with underlying disease, Lumbar spondylosis, Rheumatoid arthritis involving multiple sites, Seropositive rheumatoid arthritis, Osteoarthritis, Chronic Pain Syndrome, Fibromyalgia, Urinary incontinence, Obesity, Microcytic anemia,   Chart review:  Recent office visits:  07/17/2022 [name], Do (PCP Office Visit) for Weakness- Started: Ferrous Sulfate 325 mg, Lab orders placed, DG Knee complete 4 views order placed, Referral to Gastroenterology placed, Patient to follow-up in 1 week  07/04/2022 [name], DO (PCP Office Visit) for Hair loss- No medication changes noted, Lab orders placed, Referral to Psychiatry placed, BP 140/82,   Recent consult visits:  07/09/2022 [name], [name] PA-C (Wound Care) No medication changes noted, No orders placed,   Hospital visits:  None in previous 6 months  Medications: Outpatient Encounter Medications as of 01/02/2023  Medication Sig Note   Biotin w/ Vitamins C & E (HAIR/SKIN/NAILS PO) Take 1 tablet by mouth daily.    buPROPion (WELLBUTRIN XL) 300 MG 24 hr tablet TAKE ONE TABLET BY MOUTH DAILY    carvedilol (COREG) 6.25 MG tablet Take 1 tablet (6.25 mg total) by mouth 2 (two) times daily.    DULoxetine (CYMBALTA) 60 MG capsule Take 1 capsule (60 mg total) by mouth daily.    ezetimibe (ZETIA) 10 MG tablet TAKE 1 TABLET BY MOUTH EVERY DAY (Patient taking differently: Take 10 mg by mouth daily.)    GEMTESA 75 MG TABS Take 1 tablet by mouth daily.    Iron, Ferrous Sulfate, 325 (65 Fe) MG TABS Take 325 mg  by mouth daily.    losartan (COZAAR) 25 MG tablet TAKE ONE TABLET BY MOUTH DAILY    meloxicam (MOBIC) 15 MG tablet TAKE 1 TABLET (15 MG TOTAL) BY MOUTH DAILY.    metFORMIN (GLUCOPHAGE) 500 MG tablet TAKE ONE TABLET BY MOUTH TWICE DAILY    polyethylene glycol (MIRALAX / GLYCOLAX) 17 g packet Take 17 g by mouth daily as needed for mild constipation.    pregabalin (LYRICA) 75 MG capsule TAKE ONE CAPSULE BY MOUTH THREE TIMES DAILY    tolterodine (DETROL LA) 4 MG 24 hr capsule Take 4 mg by mouth daily. 03/14/2022: Pt states this replaced Gemtasa, per [facility] last fill date 08/07/2021 #90 for 90 day supply   No facility-administered encounter medications on file as of 01/02/2023.    Contacted patient to confirm in office appointment with Junius Argyle, CPP, PharmD on 01/04/2023 at 1100. {US HC Outreach:28874}  Do you have any problems getting your medications? {yes/no:20286} If yes what types of problems are you experiencing? {Problems:27223}  What is your top health concern you would like to discuss at your upcoming visit?   Have you seen any other providers since your last visit with PCP? {yes/no:20286}   Star Rating Drugs: *** Medication:  Last Fill: Day Supply   Care Gaps: Annual wellness visit in last year? {yes/no:20286}  If Diabetic: Last eye exam / retinopathy screening: Last diabetic foot exam:   ***sig   Lynann Bologna, CPA/CMA Clinical Pharmacist Assistant Phone: [phone]

## 2023-01-04 ENCOUNTER — Telehealth: Payer: Self-pay | Admitting: Family Medicine

## 2023-01-04 ENCOUNTER — Other Ambulatory Visit: Payer: Self-pay | Admitting: Family Medicine

## 2023-01-04 NOTE — Telephone Encounter (Signed)
Requested medications are due for refill today.  unsure  Requested medications are on the active medications list.  yes  Last refill. 12/17/2022 #90 0 rf  Future visit scheduled.   no  Notes to clinic.  Refill not delegated.    Requested Prescriptions  Pending Prescriptions Disp Refills   pregabalin (LYRICA) 75 MG capsule [Pharmacy Med Name: pregabalin 75 mg capsule] 90 capsule 0    Sig: TAKE ONE CAPSULE BY MOUTH THREE TIMES DAILY     Not Delegated - Neurology:  Anticonvulsants - Controlled - pregabalin Failed - 01/04/2023 12:52 PM      Failed - This refill cannot be delegated      Failed - Cr in normal range and within 360 days    Creatinine  Date Value Ref Range Status  08/31/2014 0.63 0.60 - 1.30 mg/dL Final   Creatinine, Ser  Date Value Ref Range Status  07/17/2022 1.45 (H) 0.57 - 1.00 mg/dL Final         Passed - Completed PHQ-2 or PHQ-9 in the last 360 days      Passed - Valid encounter within last 12 months    Recent Outpatient Visits           5 months ago Anemia, unspecified type   [place], DO   6 months ago Hair loss   [facility] Myles Gip, DO   7 months ago Financial insecurity   [place], Dionne Bucy, MD   8 months ago Encounter for annual wellness visit (AWV) in Medicare patient   [place], Dionne Bucy, MD   9 months ago Adhesive capsulitis of both shoulders   [place], Dionne Bucy, MD       Future Appointments             In 3 days MacDiarmid, Nicki Reaper, [place]

## 2023-01-04 NOTE — Telephone Encounter (Signed)
Pt is calling to report that she would like to reschedule her appt with Cristie Hem. Please advise CB- [phone]

## 2023-01-04 NOTE — Progress Notes (Unsigned)
Care Management & Coordination Services Pharmacy Note  01/04/2023 Name:  [name] [name] MRN:  [id number] DOB:  Sep 02, 1951  Summary: ***  Recommendations/Changes made from today's visit: ***  Follow up plan: ***   Subjective: [name] [name] is an 72 y.o. year old female who is a primary patient of Bacigalupo, Dionne Bucy, MD.  The care coordination team was consulted for assistance with disease management and care coordination needs.    {CCMTELEPHONEFACETOFACE:21091510} for {CCMINITIALFOLLOWUPCHOICE:21091511}.  Recent office visits: ***  Recent consult visits: ***  Hospital visits: {Hospital DC Yes/No:25215}   Objective:  Lab Results  Component Value Date   CREATININE 1.45 (H) 07/17/2022   BUN 33 (H) 07/17/2022   GFR 113.78 12/13/2016   EGFR 39 (L) 07/17/2022   GFRNONAA >60 04/05/2022   GFRAA 72 12/21/2020   NA 139 07/17/2022   K 4.9 07/17/2022   CALCIUM 8.9 07/17/2022   CO2 21 07/17/2022   GLUCOSE 213 (H) 07/17/2022    Lab Results  Component Value Date/Time   HGBA1C 7.4 (H) 03/03/2022 06:46 AM   HGBA1C 6.8 (H) 01/30/2022 11:32 AM   GFR 113.78 12/13/2016 10:56 AM   MICROALBUR 100 02/03/2020 11:28 AM   MICROALBUR 20 08/15/2017 11:35 AM    Last diabetic Eye exam:  Lab Results  Component Value Date/Time   HMDIABEYEEXA No Retinopathy 04/23/2022 12:00 AM    Last diabetic Foot exam: No results found for: "HMDIABFOOTEX"   Lab Results  Component Value Date   CHOL 238 (H) 04/19/2022   HDL 51 04/19/2022   LDLCALC 159 (H) 04/19/2022   LDLDIRECT 183.0 12/13/2016   TRIG 153 (H) 04/19/2022   CHOLHDL 4.4 10/24/2021       Latest Ref Rng & Units 04/05/2022   11:57 AM 03/14/2022    3:50 AM 03/13/2022    1:45 PM  Hepatic Function  Total Protein 6.5 - 8.1 g/dL 7.2  5.5  7.0   Albumin 3.5 - 5.0 g/dL 2.9  2.1  2.7   AST 15 - 41 U/L '11  11  15   '$ ALT 0 - 44 U/L '10  9  11   '$ Alk Phosphatase 38 - 126 U/L 64  52  70   Total Bilirubin 0.3 - 1.2 mg/dL 0.3  0.4  0.4      Lab Results  Component Value Date/Time   TSH 2.100 07/04/2022 02:46 PM   TSH 2.160 07/18/2018 09:06 AM   FREET4 0.93 12/13/2016 10:56 AM       Latest Ref Rng & Units 07/17/2022    3:32 PM 07/04/2022    2:46 PM 04/05/2022   11:57 AM  CBC  WBC 3.4 - 10.8 x10E3/uL 10.1  6.4  5.6   Hemoglobin 11.1 - 15.9 g/dL 9.9  9.9  9.6   Hematocrit 34.0 - 46.6 % 31.8  32.7  33.2   Platelets 150 - 450 x10E3/uL 457  352  360     Lab Results  Component Value Date/Time   VD25OH 33.0 02/03/2020 11:24 AM   VD25OH 27.9 (L) 07/15/2019 10:26 AM   VITAMINB12 429 07/17/2022 03:32 PM   VITAMINB12 234 03/04/2022 01:07 AM    Clinical ASCVD: {YES/NO:21197} The 10-year ASCVD risk score (Arnett DK, et al., 2019) is: 27.4%   Values used to calculate the score:     Age: 89 years     Sex: Female     Is Non-Hispanic African American: No     Diabetic: Yes     Tobacco smoker: No  Systolic Blood Pressure: 456 mmHg     Is BP treated: Yes     HDL Cholesterol: 51 mg/dL     Total Cholesterol: 238 mg/dL    ***Other: (CHADS2VASc if Afib, MMRC or CAT for COPD, ACT, DEXA)     07/17/2022    2:51 PM 07/04/2022    1:48 PM 06/19/2022   11:03 AM  Depression screen PHQ 2/9  Decreased Interest 1 1 0  Down, Depressed, Hopeless 2 1 0  PHQ - 2 Score 3 2 0  Altered sleeping 1 0   Tired, decreased energy 3 2   Change in appetite 1 1   Feeling bad or failure about yourself  0 1   Trouble concentrating 2 0   Moving slowly or fidgety/restless 0 1   Suicidal thoughts 1 0   PHQ-9 Score 11 7   Difficult doing work/chores Extremely dIfficult Not difficult at all      Social History   Tobacco Use  Smoking Status Former   Packs/day: 1.00   Years: 29.00   Total pack years: 29.00   Types: Cigarettes   Quit date: 12/03/1996   Years since quitting: 26.1  Smokeless Tobacco Never   BP Readings from Last 3 Encounters:  07/17/22 (!) 83/53  07/04/22 (!) 140/82  05/31/22 130/78   Pulse Readings from Last 3 Encounters:   07/17/22 82  07/04/22 94  04/19/22 70   Wt Readings from Last 3 Encounters:  07/17/22 240 lb (108.9 kg)  07/04/22 241 lb 4.8 oz (109.5 kg)  05/31/22 234 lb (106.1 kg)   BMI Readings from Last 3 Encounters:  07/17/22 37.59 kg/m  07/04/22 37.79 kg/m  05/31/22 36.65 kg/m    Allergies  Allergen Reactions   Sulfa Antibiotics Itching, Rash and Swelling   Latex Itching    Other reaction(s): angioedema   Doxycycline Rash   Empagliflozin Itching and Other (See Comments)    Itching and frequent yeast infections    Medications Reviewed Today     Reviewed by Myles Gip, DO (Physician) on 07/17/22 at 50  Med List Status: <None>   Medication Order Taking? Sig Documenting Provider Last Dose Status Informant  Biotin w/ Vitamins C & E (HAIR/SKIN/NAILS PO) [id number] Yes Take 1 tablet by mouth daily. [provider] Taking Active Self  buPROPion 450 MG TB24 [id number] Yes Take 450 mg by mouth daily. Virginia Crews, MD Taking Active   carvedilol (COREG) 6.25 MG tablet [id number] Yes TAKE ONE TABLET BY MOUTH TWICE DAILY Gwyneth Sprout, FNP Taking Active   DULoxetine (CYMBALTA) 60 MG capsule [id number] Yes NEW PRESCRIPTION REQUEST: TAKE ONE CAPSULE BY MOUTH DAILY Bacigalupo, Dionne Bucy, MD Taking Active   ezetimibe (ZETIA) 10 MG tablet [id number] Yes TAKE 1 TABLET BY MOUTH EVERY DAY  Patient taking differently: Take 10 mg by mouth daily.   Virginia Crews, MD Taking Active Self  GEMTESA 75 MG TABS [id number] Yes Take 1 tablet by mouth daily. [provider] Taking Active   losartan (COZAAR) 25 MG tablet [id number] Yes TAKE ONE TABLET BY MOUTH DAILY Gwyneth Sprout, FNP Taking Active   meloxicam (MOBIC) 15 MG tablet [id number] Yes TAKE 1 TABLET (15 MG TOTAL) BY MOUTH DAILY. Virginia Crews, MD Taking Active   metFORMIN (GLUCOPHAGE) 500 MG tablet [id number] Yes NEW PRESCRIPTION REQUEST: TAKE ONE TABLET BY MOUTH TWICE DAILY Bacigalupo, Dionne Bucy, MD Taking Active    polyethylene glycol (MIRALAX / GLYCOLAX) 17 g packet [id number] Yes Take 17 g  by mouth daily as needed for mild constipation. Bonnielee Haff, MD Taking Active Self  pregabalin (LYRICA) 75 MG capsule [id number] Yes TAKE ONE CAPSULE BY MOUTH THREE TIMES DAILY Gwyneth Sprout, FNP Taking Active   tolterodine (DETROL LA) 4 MG 24 hr capsule [id number] Yes Take 4 mg by mouth daily. [provider] Taking Active Self           Med Note (COFFELL, Dionne Bucy   Wed Mar 14, 2022 12:28 PM) Pt states this replaced Gemtasa, per [facility] last fill date 08/07/2021 #90 for 90 day supply            SDOH:  (Social Determinants of Health) assessments and interventions performed: {yes/no:20286} SDOH Interventions    Flowsheet Row Video Visit from 05/31/2022 in [place] Office Visit from 04/19/2022 in [place] Office Visit from 07/18/2021 in [place] from 02/13/2021 in [place] Video Visit from 12/26/2020 in [place] Video Visit from 11/11/2020 in [place]  SDOH Interventions        Depression Interventions/Treatment  Currently on Treatment Currently on Treatment Currently on Treatment Currently on Treatment Currently on Treatment Medication       Medication Assistance: {MEDASSISTANCEINFO:25044}  Medication Access: Within the past 30 days, how often has patient missed a dose of medication? *** Is a pillbox or other method used to improve adherence? {YES/NO:21197} Factors that may affect medication adherence? {CHL DESC; BARRIERS:21522} Are meds synced by current pharmacy? {YES/NO:21197} Are meds delivered by current pharmacy? {YES/NO:21197} Does patient experience delays in picking up medications due to transportation concerns? {YES/NO:21197}  [facility] Reviewed: Is patient disadvantaged to use  [facility]?: {YES/NO:21197} Current Rx insurance plan: *** Name and location of Current pharmacy:  [id number] [address]: [phone]: [phone] [facility] [place]Phone: [phone]: [phone] [facility] services reviewed with patient today?: {YES/NO:21197} Patient requests to transfer care to [facility]?: {YES/NO:21197} Reason patient declined to change pharmacies: {US patient preference:27474}  Compliance/Adherence/Medication fill history: Care Gaps: ***  Star-Rating Drugs: ***   Assessment/Plan   {CCM PHARMD DISEASE STATES:25130}  ***

## 2023-01-07 ENCOUNTER — Ambulatory Visit (INDEPENDENT_AMBULATORY_CARE_PROVIDER_SITE_OTHER): Payer: 59 | Admitting: Urology

## 2023-01-07 ENCOUNTER — Encounter: Payer: Self-pay | Admitting: Urology

## 2023-01-07 VITALS — BP 166/97 | HR 103 | Ht 67.0 in | Wt 260.0 lb

## 2023-01-07 DIAGNOSIS — N3946 Mixed incontinence: Secondary | ICD-10-CM | POA: Diagnosis not present

## 2023-01-07 LAB — MICROSCOPIC EXAMINATION: Epithelial Cells (non renal): >10 /hpf — AB (ref 0–10)

## 2023-01-07 LAB — URINALYSIS, COMPLETE
Bilirubin, UA: NEGATIVE
Glucose, UA: NEGATIVE
Ketones, UA: NEGATIVE
Nitrite, UA: NEGATIVE
Specific Gravity, UA: >1.03 — ABNORMAL HIGH (ref 1.005–1.030)
Urobilinogen, Ur: 0.2 mg/dL (ref 0.2–1.0)
pH, UA: 5 (ref 5.0–7.5)

## 2023-01-07 MED ORDER — TOLTERODINE TARTRATE ER 4 MG PO CP24: 4.0000 mg | ORAL_CAPSULE | Freq: Every day | ORAL | 3 refills | Status: AC

## 2023-01-07 MED ORDER — GEMTESA 75 MG PO TABS: 1.0000 | ORAL_TABLET | Freq: Every day | ORAL | 3 refills | Status: AC

## 2023-01-07 NOTE — Progress Notes (Signed)
01/07/2023 10:26 AM   [name] [name] 04-15-1951 [id number]  Referring provider: Virginia Crews, [address],  [place] [address]  Chief Complaint  Patient presents with   Follow-up   Urinary Incontinence    HPI: 2019: Patient was consulted to be assessed for worsening urinary incontinence since a hysterectomy in October 2018.  She leaks with coughing sneezing bending lifting a significant amount.  She wears 10-15 pads a day that are soaked.  She has urge incontinence.  She can run water and leak without awareness. She will leak when she goes from a sitting to standing position.  She has foot on the floor syndrome.  She has mild bedwetting but not every night   She voids every 30 to 45 minutes and cannot hold it for 2 hours.  She gets up 5-6 times a night.  She has left ankle edema.   The pelvic examination was a little bit limited by her obesity.  She had grade 2 hypermobility the bladder neck and a negative cough test but her bladder I believe is quite empty.  She had a large suprapubic fat pad   Patient has high-volume mixed incontinence with typical triggers.  She has intermittent milder bedwetting.  She has severe frequency.  She has significant nocturia.  If the patient ever needed a sling she would need a mini sling and not a retropubic    On urodynamics she did not void but her bladder was empty.  Her maximum bladder capacity was 100 mL.  She had sensory urgency.  The maximum detrusor pressure was 38 cm water occurring at 62 mL.  She had to void off the contraction.  She had no leakage associated with coughing reaching a pressure of 148 cm of water.  She was triggering instability.  The instability was provoked and unprovoked throughout.  During voluntary voiding she voided 76 mils of maximum voiding pressure 21 cm of water.  She emptied efficiently.  Bladder neck descent at 1 cm.   At least 90% of the patient's problem is not overactive bladder.  If  she fails medical and behavioral therapy I would be offering her a refractory overactive bladder therapy.  If she truly does have stress incontinence this could persist.   Based upon body habitus Botox and InterStim would not be ideal   Myrbetriq helped minimally.  She now only wears about 3 pads a day and only getting up twice at night   She is concerned about percutaneous tibial nerve stimulation because of neuropathy and a skin condition I believe.   Less frequency during the day.  Gets up twice instead of four times.  Three pads per day incontinence stable.    Detrol works better than the oxybutynin gave side effect.  Still using 3 pads a day.  Getting up 2-4 times a day.  Overall much better.  I think we have reached the end of the algorithm.  She thought about percutaneous tibial nerve stimulation but is chosen not to.  Reassess in the year on Detrol   Patient still has urge incontinence wearing 3 pads a day.  Detrol helps some.  Clinically not infected.  Frequency stable.  She started to do to do percutaneous tibial nerve stimulation treatments but was having trouble with neuropathy and some leg ulcers and stopped.  She is lost 160 pounds.  She is going to have plastic surgery.     I sent 90x3 Detrol.  I gave her  the new beta 3 agonists with samples and prescription.  She will stay on combination if it works well.   Patient has had a dramatic improvement with Gemtesa the new beta 3 agonists greatly reducing her urgency incontinence and improving her quality life.  She is leaking a lot less.  The Detrol is helping minimally but the 2 together are working well.  Clinically not infected.  She is now lost 170 pounds.  She is going to have plastic surgery on September 7 to remove with her pannus   She is failed Myrbetriq and double other antimuscarinics.  She is failed percutaneous tibial nerve stimulation.  Due to obesity was not a good candidate for InterStim and Botox   In the last 2 weeks  incontinence worse.  She went from 3 pads a day to 10 pads that can be quite soaked.  Still on the [place] and Detrol.  Getting up 3-4 times a night now.  No infection symptoms.  Plastic surgery on pannus delayed till next month.  On full examination she had a large suprapubic pannus.  I think physically she could have sacral nerve stimulation based on body habitus.  I palpated her lower area  Patient has high-volume urge incontinence.  Currently I think we have reached the end of the treatment pathway but she may be a good candidate for InterStim or perhaps even Botox post plastic surgery recovery.  She might be able to perform self-catheterization at that point.  Call if urine culture is positive   Urine looked positive.  I gave her ciprofloxacin 250 mg twice a day for 7 days.   Gave 6 weeks of Gemtesa.  She can come in 6 weeks of his work and get more samples.  I will see him in 3 months postsurgery.  We can entertain Botox and InterStim then if needed  Today I last saw the patient in October 2022.  Still on Gemtesa and Detrol though her prescriptions ran out.  She has 3-4 pads a day soaking can [place] to urge incontinence.  It can run down her leg.  Clinically not infected.  Still leaks a moderate amount with coughing sneezing.  No bedwetting.  She had the pannus removed and had a number of wound issues with I believe 5 admissions.  This the abdominal area is completely healed now.  On no antibiotics.  Allergic to sulfa and latex and doxycycline.  Wound dehiscence completely healed.  Clinically not infected.  Frequency stable.  I went over Botox and InterStim with full templates.  I examined her lower back and she would be a good candidate for both treatments.  Large pannus has been removed.       PMH: Past Medical History:  Diagnosis Date   Anxiety    Arthritis    Rhumetoid arthritis   BRCA negative 07/2017   MyRisk neg   Cancer ([facility])    uterine   Cellulitis of both lower  extremities    Chronic   CHF (congestive heart failure) (HCC)    Coronary artery disease    Depression    Diabetes mellitus without complication ([facility])    Family history of breast cancer 07/2017   MyRisk neg; IBIS=10.5%/riskscore=17%   Family history of ovarian cancer    Fibromyalgia    Fibromyalgia affecting shoulder region    Headache    Hyperlipidemia    Hypertension    Spinal stenosis     Surgical History: Past Surgical History:  Procedure Laterality Date  ABLATION SAPHENOUS VEIN W/ RFA     ANAL FISSURECTOMY  01/2004   COLON SURGERY     CORONARY ANGIOPLASTY     CYSTOSCOPY  09/03/2017   Procedure: CYSTOSCOPY;  Surgeon: Gae Dry, MD;  Location: [facility] ORS;  Service: Gynecology;;   DILATION AND CURETTAGE OF UTERUS     ESOPHAGEAL DILATION  02/20/2022   Procedure: ESOPHAGEAL DILATION;  Surgeon: Carol Ada, MD;  Location: [facility] ENDOSCOPY;  Service: Gastroenterology;;  savory   ESOPHAGOGASTRODUODENOSCOPY (EGD) WITH PROPOFOL N/A 02/20/2022   Procedure: ESOPHAGOGASTRODUODENOSCOPY (EGD) WITH PROPOFOL;  Surgeon: Carol Ada, MD;  Location: [facility];  Service: Gastroenterology;  Laterality: N/A;  Dyspahgia   EYE SURGERY Bilateral 08/2020   eye lift   HERNIA REPAIR     INCISION AND DRAINAGE OF WOUND  03/04/2022   Procedure: IRRIGATION AND DEBRIDEMENT ABDOMEN WITH VAC PLACEMENT, MYRIAD PLACEMENT;  Surgeon: Wallace Going, DO;  Location: [facility];  Service: Plastics;;   LAPAROSCOPIC HYSTERECTOMY N/A 09/03/2017   Procedure: HYSTERECTOMY TOTAL LAPAROSCOPIC BSO;  Surgeon: Gae Dry, MD;  Location: [facility];  Service: Gynecology;  Laterality: N/A;   MEDIAL PARTIAL KNEE REPLACEMENT Left 01/06/2014   PANNICULECTOMY N/A 02/05/2022   Procedure: PANNICULECTOMY;  Surgeon: Wallace Going, DO;  Location: [facility];  Service: Plastics;  Laterality: N/A;  3 hours   UMBILICAL HERNIA REPAIR N/A 09/05/2015   Procedure: HERNIA REPAIR incarcerated UMBILICAL ADULT;  Surgeon: Sherri Rad,  MD;  Location: [facility] ORS;  Service: General;  Laterality: N/A;   Vascular Stent  11/04/2013    Home Medications:  Allergies as of 01/07/2023       Reactions   Sulfa Antibiotics Itching, Rash, Swelling   Latex Itching   Other reaction(s): angioedema   Doxycycline Rash   Empagliflozin Itching, Other (See Comments)   Itching and frequent yeast infections        Medication List        Accurate as of January 07, 2023 10:26 AM. If you have any questions, ask your nurse or doctor.          buPROPion 300 MG 24 hr tablet Commonly known as: WELLBUTRIN XL TAKE ONE TABLET BY MOUTH DAILY   carvedilol 6.25 MG tablet Commonly known as: COREG Take 1 tablet (6.25 mg total) by mouth 2 (two) times daily.   DULoxetine 60 MG capsule Commonly known as: CYMBALTA Take 1 capsule (60 mg total) by mouth daily.   ezetimibe 10 MG tablet Commonly known as: ZETIA TAKE 1 TABLET BY MOUTH EVERY DAY   Gemtesa 75 MG Tabs Generic drug: Vibegron Take 1 tablet by mouth daily.   HAIR/SKIN/NAILS PO Take 1 tablet by mouth daily.   Iron (Ferrous Sulfate) 325 (65 Fe) MG Tabs Take 325 mg by mouth daily.   losartan 25 MG tablet Commonly known as: COZAAR TAKE ONE TABLET BY MOUTH DAILY   meloxicam 15 MG tablet Commonly known as: MOBIC TAKE 1 TABLET (15 MG TOTAL) BY MOUTH DAILY.   metFORMIN 500 MG tablet Commonly known as: GLUCOPHAGE TAKE ONE TABLET BY MOUTH TWICE DAILY   polyethylene glycol 17 g packet Commonly known as: MIRALAX / GLYCOLAX Take 17 g by mouth daily as needed for mild constipation.   pregabalin 75 MG capsule Commonly known as: LYRICA TAKE ONE CAPSULE BY MOUTH THREE TIMES DAILY   tolterodine 4 MG 24 hr capsule Commonly known as: DETROL LA Take 4 mg by mouth daily.        Allergies:  Allergies  Allergen  Reactions   Sulfa Antibiotics Itching, Rash and Swelling   Latex Itching    Other reaction(s): angioedema   Doxycycline Rash   Empagliflozin Itching and Other (See  Comments)    Itching and frequent yeast infections    Family History: Family History  Problem Relation Age of Onset   Hypertension Mother    Cataracts Mother    Thyroid disease Mother    Dementia Mother    COPD Father    Dementia Father    Cataracts Father    Aneurysm Father        Abdominal & Brain   Diabetes Father    Melanoma Father    Breast cancer Sister 68   Fibromyalgia Sister    Migraines Sister    Hypertension Sister    Breast cancer Sister 37   COPD Sister    Ovarian cancer Sister 44   Migraines Brother    Heart attack Brother    Colon cancer Paternal Uncle        65s   Colon cancer Paternal Uncle        80s   Uterine cancer Cousin    Uterine cancer Cousin     Social History:  reports that she quit smoking about 26 years ago. Her smoking use included cigarettes. She has a 29.00 pack-year smoking history. She has been exposed to tobacco smoke. She has never used smokeless tobacco. She reports that she does not drink alcohol and does not use drugs.  ROS:                                        Physical Exam: BP (!) 166/97   Pulse (!) 103   Ht '5\' 7"'$  (1.702 m)   Wt 117.9 kg   LMP  (LMP Unknown) Comment: age 29  BMI 40.72 kg/m   Constitutional:  Alert and oriented, No acute distress. HEENT: [place] AT, moist mucus membranes.  Trachea midline, no masses.   Laboratory Data: Lab Results  Component Value Date   WBC 10.1 07/17/2022   HGB 9.9 (L) 07/17/2022   HCT 31.8 (L) 07/17/2022   MCV 71 (L) 07/17/2022   PLT 457 (H) 07/17/2022    Lab Results  Component Value Date   CREATININE 1.45 (H) 07/17/2022    No results found for: "PSA"  No results found for: "TESTOSTERONE"  Lab Results  Component Value Date   HGBA1C 7.4 (H) 03/03/2022    Urinalysis    Component Value Date/Time   COLORURINE STRAW (A) 03/13/2022 2313   APPEARANCEUR CLEAR 03/13/2022 2313   APPEARANCEUR Clear 10/02/2021 1127   LABSPEC 1.033 (H) 03/13/2022 2313    PHURINE 6.0 03/13/2022 2313   GLUCOSEU NEGATIVE 03/13/2022 2313   HGBUR SMALL (A) 03/13/2022 2313   BILIRUBINUR NEGATIVE 03/13/2022 2313   BILIRUBINUR Negative 10/02/2021 1127   KETONESUR NEGATIVE 03/13/2022 2313   PROTEINUR NEGATIVE 03/13/2022 2313   NITRITE NEGATIVE 03/13/2022 2313   LEUKOCYTESUR NEGATIVE 03/13/2022 2313    Pertinent Imaging:   Assessment & Plan: Patient has mixed incontinence but primarily high-volume urge incontinence.  Both prescriptions renewed 90 x 3.  She would like to proceed with a peripheral nerve evaluation.  Otherwise we will do Botox if she fails.  Call if culture positive.  We will schedule the peripheral nerve evaluation in [place] but do the implant here locally  1. Mixed incontinence  - Urinalysis, Complete  No follow-ups on file.  Reece Packer, MD  [facility] [address] [place], [place] [address] [phone]

## 2023-01-07 NOTE — Patient Instructions (Signed)

## 2023-01-08 DIAGNOSIS — H52223 Regular astigmatism, bilateral: Secondary | ICD-10-CM | POA: Diagnosis not present

## 2023-01-08 DIAGNOSIS — Z7984 Long term (current) use of oral hypoglycemic drugs: Secondary | ICD-10-CM | POA: Diagnosis not present

## 2023-01-08 DIAGNOSIS — H524 Presbyopia: Secondary | ICD-10-CM | POA: Diagnosis not present

## 2023-01-08 DIAGNOSIS — H25013 Cortical age-related cataract, bilateral: Secondary | ICD-10-CM | POA: Diagnosis not present

## 2023-01-08 DIAGNOSIS — H5203 Hypermetropia, bilateral: Secondary | ICD-10-CM | POA: Diagnosis not present

## 2023-01-08 DIAGNOSIS — E119 Type 2 diabetes mellitus without complications: Secondary | ICD-10-CM | POA: Diagnosis not present

## 2023-01-08 DIAGNOSIS — H2513 Age-related nuclear cataract, bilateral: Secondary | ICD-10-CM | POA: Diagnosis not present

## 2023-01-08 LAB — HM DIABETES EYE EXAM

## 2023-01-10 ENCOUNTER — Encounter: Payer: Self-pay | Admitting: Family Medicine

## 2023-01-10 LAB — CULTURE, URINE COMPREHENSIVE

## 2023-01-11 ENCOUNTER — Ambulatory Visit: Payer: 59

## 2023-01-11 NOTE — Progress Notes (Unsigned)
I,Dawne Casali S Marisol Glazer,acting as a Education administrator for Lavon Paganini, MD.,have documented all relevant documentation on the behalf of Lavon Paganini, MD,as directed by  Lavon Paganini, MD while in the presence of Lavon Paganini, MD.     Established patient visit   Patient: [name] [name]   DOB: 1950-12-11   72 y.o. Female  MRN: [id number] Visit Date: 01/14/2023  Today's healthcare provider: Lavon Paganini, MD   Chief Complaint  Patient presents with   Rash   Memory Loss   Subjective    Rash This is a new problem. The current episode started more than 1 month ago. The problem has been waxing and waning since onset. The affected locations include the scalp, left arm, right arm and face. The rash is characterized by itchiness and scaling. She was exposed to nothing. Pertinent negatives include no cough, fever or shortness of breath. Past treatments include nothing.    Patient requesting dementia test. She reports her mother and all of her 12 siblings died from dementia. She reports her sister did genetic testing and was told that girls in her family have 61 % chance of developing dementia in the lifetime. She reports noticing memory changes in the past year. She reports not remembering things well in the last year. She loses things and can't remember things she needs to do.      01/14/2023   10:45 AM  MMSE - Mini Mental State Exam  Orientation to time 5  Orientation to Place 5  Registration 3  Attention/ Calculation 4  Recall 3  Language- name 2 objects 2  Language- repeat 1  Language- follow 3 step command 3  Language- read & follow direction 1  Write a sentence 1  Copy design 1  Total score 29     Patient C/O involuntary twitches daily x 2-3 months. She reports twitches all over body.    Medications: Outpatient Medications Prior to Visit  Medication Sig   Biotin w/ Vitamins C & E (HAIR/SKIN/NAILS PO) Take 1 tablet by mouth daily.   buPROPion (WELLBUTRIN XL) 300 MG  24 hr tablet TAKE ONE TABLET BY MOUTH DAILY   carvedilol (COREG) 6.25 MG tablet Take 1 tablet (6.25 mg total) by mouth 2 (two) times daily.   DULoxetine (CYMBALTA) 60 MG capsule Take 1 capsule (60 mg total) by mouth daily.   ezetimibe (ZETIA) 10 MG tablet TAKE 1 TABLET BY MOUTH EVERY DAY   GEMTESA 75 MG TABS Take 1 tablet (75 mg total) by mouth daily.   metFORMIN (GLUCOPHAGE) 500 MG tablet TAKE ONE TABLET BY MOUTH TWICE DAILY   polyethylene glycol (MIRALAX / GLYCOLAX) 17 g packet Take 17 g by mouth daily as needed for mild constipation.   pregabalin (LYRICA) 75 MG capsule TAKE ONE CAPSULE BY MOUTH THREE TIMES DAILY   tolterodine (DETROL LA) 4 MG 24 hr capsule Take 1 capsule (4 mg total) by mouth daily.   losartan (COZAAR) 25 MG tablet TAKE ONE TABLET BY MOUTH DAILY (Patient not taking: Reported on 01/14/2023)   meloxicam (MOBIC) 15 MG tablet TAKE 1 TABLET (15 MG TOTAL) BY MOUTH DAILY. (Patient not taking: Reported on 01/14/2023)   [DISCONTINUED] Iron, Ferrous Sulfate, 325 (65 Fe) MG TABS Take 325 mg by mouth daily. (Patient not taking: Reported on 01/14/2023)   No facility-administered medications prior to visit.    Review of Systems  Constitutional:  Negative for chills and fever.  Respiratory:  Negative for cough, chest tightness, shortness of breath and wheezing.  Skin:  Positive for rash.  Neurological:  Positive for tremors.       Objective    BP 133/76 (BP Location: Right Arm, Patient Position: Sitting, Cuff Size: Large)   Pulse 84   Temp 98 F (36.7 C) (Temporal)   Resp 20   Wt 260 lb 11.2 oz (118.3 kg)   LMP  (LMP Unknown) Comment: age 69  SpO2 96%   BMI 40.83 kg/m    Physical Exam Vitals reviewed.  Constitutional:      General: She is not in acute distress.    Appearance: Normal appearance. She is well-developed. She is not diaphoretic.  HENT:     Head: Normocephalic and atraumatic.  Eyes:     General: No scleral icterus.    Conjunctiva/sclera: Conjunctivae  normal.  Neck:     Thyroid: No thyromegaly.  Cardiovascular:     Rate and Rhythm: Normal rate and regular rhythm.     Pulses: Normal pulses.     Heart sounds: Normal heart sounds. No murmur heard. Pulmonary:     Effort: Pulmonary effort is normal. No respiratory distress.     Breath sounds: Normal breath sounds. No wheezing, rhonchi or rales.  Musculoskeletal:     Cervical back: Neck supple.     Right lower leg: No edema.     Left lower leg: No edema.  Lymphadenopathy:     Cervical: No cervical adenopathy.  Skin:    General: Skin is warm and dry.     Findings: Rash (flaking of scalp, erythematous patches on arms, legs, abdominal wall) present.  Neurological:     Mental Status: She is alert and oriented to person, place, and time.     Sensory: No sensory deficit.     Motor: No weakness.  Psychiatric:        Mood and Affect: Mood normal.      No results found for any visits on 01/14/23.  Assessment & Plan     Problem List Items Addressed This Visit       Musculoskeletal and Integument   Lumbar spondylosis (Chronic)    PT helped previously, now hurting again Will refer to PT for further therapy      Relevant Orders   Ambulatory referral to Physical Therapy   Eczema - Primary    New problem Rash is c/w eczema Will treat with triamcinolone ointment BID prn      Seborrheic dermatitis of scalp    New problem Will treat with ketoconazole shampoo        Other   Memory changes    Worsening MMSE 29/30, but subjective memory concerns Will refer to neuro for further eval and management      Relevant Orders   Ambulatory referral to Neurology   Myoclonus    New, intermittent problem Not witnessed during exam today Wide differential Consider stress given recent issues Will send to neuro for further eval/management      Relevant Orders   Ambulatory referral to Neurology     Return in about 4 weeks (around 02/11/2023) for chronic disease f/u.      I, Lavon Paganini, MD, have reviewed all documentation for this visit. The documentation on 01/14/23 for the exam, diagnosis, procedures, and orders are all accurate and complete.   Bacigalupo, Dionne Bucy, MD, MPH [facility]

## 2023-01-11 NOTE — Progress Notes (Unsigned)
Care Management & Coordination Services Pharmacy Note  01/11/2023 Name:  CAIRA LEMMON MRN:  [id number] DOB:  [birth year]  Summary: ***  Recommendations/Changes made from today's visit: ***  Follow up plan: ***   Subjective: [name] [name] is an 72 y.o. year old female who is a primary patient of Bacigalupo, Dionne Bucy, MD.  The care coordination team was consulted for assistance with disease management and care coordination needs.    Engaged with patient by telephone for initial visit.  Recent office visits: 07/17/2022 [name], Do (PCP Office Visit) for Weakness- Started: Ferrous Sulfate 325 mg, Lab orders placed, DG Knee complete 4 views order placed, Referral to Gastroenterology placed, Patient to follow-up in 1 week    07/04/2022 [name], DO (PCP Office Visit) for Hair loss- No medication changes noted, Lab orders placed, Referral to Psychiatry placed, BP 140/82,   05/31/22: Video visit with Dr. Brita Romp for follow-up.   Recent consult visits: 07/09/2022 [name], [name] PA-C (Wound Care) No medication changes noted, No orders placed,    Hospital visits: None in previous 6 months   Objective:  Lab Results  Component Value Date   CREATININE 1.45 (H) 07/17/2022   BUN 33 (H) 07/17/2022   GFR 113.78 12/13/2016   EGFR 39 (L) 07/17/2022   GFRNONAA >60 04/05/2022   GFRAA 72 12/21/2020   NA 139 07/17/2022   K 4.9 07/17/2022   CALCIUM 8.9 07/17/2022   CO2 21 07/17/2022   GLUCOSE 213 (H) 07/17/2022    Lab Results  Component Value Date/Time   HGBA1C 7.4 (H) 03/03/2022 06:46 AM   HGBA1C 6.8 (H) 01/30/2022 11:32 AM   GFR 113.78 12/13/2016 10:56 AM   MICROALBUR 100 02/03/2020 11:28 AM   MICROALBUR 20 08/15/2017 11:35 AM    Last diabetic Eye exam:  Lab Results  Component Value Date/Time   HMDIABEYEEXA No Retinopathy 01/08/2023 12:00 AM    Last diabetic Foot exam: No results found for: "HMDIABFOOTEX"   Lab Results  Component Value Date   CHOL  238 (H) 04/19/2022   HDL 51 04/19/2022   LDLCALC 159 (H) 04/19/2022   LDLDIRECT 183.0 12/13/2016   TRIG 153 (H) 04/19/2022   CHOLHDL 4.4 10/24/2021       Latest Ref Rng & Units 04/05/2022   11:57 AM 03/14/2022    3:50 AM 03/13/2022    1:45 PM  Hepatic Function  Total Protein 6.5 - 8.1 g/dL 7.2  5.5  7.0   Albumin 3.5 - 5.0 g/dL 2.9  2.1  2.7   AST 15 - 41 U/L 11  11  15   $ ALT 0 - 44 U/L 10  9  11   $ Alk Phosphatase 38 - 126 U/L 64  52  70   Total Bilirubin 0.3 - 1.2 mg/dL 0.3  0.4  0.4     Lab Results  Component Value Date/Time   TSH 2.100 07/04/2022 02:46 PM   TSH 2.160 07/18/2018 09:06 AM   FREET4 0.93 12/13/2016 10:56 AM       Latest Ref Rng & Units 07/17/2022    3:32 PM 07/04/2022    2:46 PM 04/05/2022   11:57 AM  CBC  WBC 3.4 - 10.8 x10E3/uL 10.1  6.4  5.6   Hemoglobin 11.1 - 15.9 g/dL 9.9  9.9  9.6   Hematocrit 34.0 - 46.6 % 31.8  32.7  33.2   Platelets 150 - 450 x10E3/uL 457  352  360     Lab Results  Component Value Date/Time   VD25OH 33.0 02/03/2020 11:24 AM   VD25OH 27.9 (L) 07/15/2019 10:26 AM   VITAMINB12 429 07/17/2022 03:32 PM   VITAMINB12 234 03/04/2022 01:07 AM    Clinical ASCVD: Yes  The 10-year ASCVD risk score (Arnett DK, et al., 2019) is: 34.8%   Values used to calculate the score:     Age: 30 years     Sex: Female     Is Non-Hispanic African American: No     Diabetic: Yes     Tobacco smoker: No     Systolic Blood Pressure: [id number] mmHg     Is BP treated: Yes     HDL Cholesterol: 51 mg/dL     Total Cholesterol: 238 mg/dL       07/17/2022    2:51 PM 07/04/2022    1:48 PM 06/19/2022   11:03 AM  Depression screen PHQ 2/9  Decreased Interest 1 1 0  Down, Depressed, Hopeless 2 1 0  PHQ - 2 Score 3 2 0  Altered sleeping 1 0   Tired, decreased energy 3 2   Change in appetite 1 1   Feeling bad or failure about yourself  0 1   Trouble concentrating 2 0   Moving slowly or fidgety/restless 0 1   Suicidal thoughts 1 0   PHQ-9 Score 11 7   Difficult  doing work/chores Extremely dIfficult Not difficult at all      Social History   Tobacco Use  Smoking Status Former   Packs/day: 1.00   Years: 29.00   Total pack years: 29.00   Types: Cigarettes   Quit date: 12/03/1996   Years since quitting: 26.1   Passive exposure: Past  Smokeless Tobacco Never   BP Readings from Last 3 Encounters:  01/07/23 (!) 166/97  07/17/22 (!) 83/53  07/04/22 (!) 140/82   Pulse Readings from Last 3 Encounters:  01/07/23 (!) 103  07/17/22 82  07/04/22 94   Wt Readings from Last 3 Encounters:  01/07/23 260 lb (117.9 kg)  07/17/22 240 lb (108.9 kg)  07/04/22 241 lb 4.8 oz (109.5 kg)   BMI Readings from Last 3 Encounters:  01/07/23 40.72 kg/m  07/17/22 37.59 kg/m  07/04/22 37.79 kg/m    Allergies  Allergen Reactions   Sulfa Antibiotics Itching, Rash and Swelling   Latex Itching    Other reaction(s): angioedema   Doxycycline Rash   Empagliflozin Itching and Other (See Comments)    Itching and frequent yeast infections    Medications Reviewed Today     Reviewed by Despina Hidden, CMA (Certified Medical Assistant) on 01/07/23 at [place] List Status: <None>   Medication Order Taking? Sig Documenting Provider Last Dose Status Informant  Biotin w/ Vitamins C & E (HAIR/SKIN/NAILS PO) [id number] Yes Take 1 tablet by mouth daily. [provider] Taking Active Self  buPROPion (WELLBUTRIN XL) 300 MG 24 hr tablet [id number] Yes TAKE ONE TABLET BY MOUTH DAILY Bacigalupo, Dionne Bucy, MD Taking Active   carvedilol (COREG) 6.25 MG tablet [id number] Yes Take 1 tablet (6.25 mg total) by mouth 2 (two) times daily. Myles Gip, DO Taking Active   DULoxetine (CYMBALTA) 60 MG capsule [id number] Yes Take 1 capsule (60 mg total) by mouth daily. Gwyneth Sprout, FNP Taking Active   ezetimibe (ZETIA) 10 MG tablet [id number] Yes TAKE 1 TABLET BY MOUTH EVERY DAY Brita Romp Dionne Bucy, MD Taking Active Self  GEMTESA 75 MG TABS [id number] Yes Take 1 tablet  by  mouth daily. [provider] Taking Active   Iron, Ferrous Sulfate, 325 (65 Fe) MG TABS [id number] Yes Take 325 mg by mouth daily. Myles Gip, DO Taking Active   losartan (COZAAR) 25 MG tablet [id number] Yes TAKE ONE TABLET BY MOUTH DAILY Gwyneth Sprout, FNP Taking Active   meloxicam (MOBIC) 15 MG tablet [id number] Yes TAKE 1 TABLET (15 MG TOTAL) BY MOUTH DAILY. Virginia Crews, MD Taking Active   metFORMIN (GLUCOPHAGE) 500 MG tablet [id number] Yes TAKE ONE TABLET BY MOUTH TWICE DAILY Bacigalupo, Dionne Bucy, MD Taking Active   polyethylene glycol (MIRALAX / GLYCOLAX) 17 g packet [id number] Yes Take 17 g by mouth daily as needed for mild constipation. Bonnielee Haff, MD Taking Active Self  pregabalin (LYRICA) 75 MG capsule [id number] Yes TAKE ONE CAPSULE BY MOUTH THREE TIMES DAILY Bacigalupo, Dionne Bucy, MD Taking Active   tolterodine (DETROL LA) 4 MG 24 hr capsule [id number] Yes Take 4 mg by mouth daily. [provider] Taking Active Self           Med Note Oneita Jolly Jan 07, 2023 10:18 AM)              SDOH:  (Social Determinants of Health) assessments and interventions performed: Yes SDOH Interventions    Flowsheet Row Video Visit from 05/31/2022 in [place] Office Visit from 04/19/2022 in [place] Office Visit from 07/18/2021 in [place] from 02/13/2021 in [place] Video Visit from 12/26/2020 in [place] Video Visit from 11/11/2020 in [place]  SDOH Interventions        Depression Interventions/Treatment  Currently on Treatment Currently on Treatment Currently on Treatment Currently on Treatment Currently on Treatment Medication       Medication Assistance: {MEDASSISTANCEINFO:25044}  Medication Access: Within the past 30 days, how often has patient missed a  dose of medication? *** Is a pillbox or other method used to improve adherence? {YES/NO:21197} Factors that may affect medication adherence? {CHL DESC; BARRIERS:21522} Are meds synced by current pharmacy? {YES/NO:21197} Are meds delivered by current pharmacy? {YES/NO:21197} Does patient experience delays in picking up medications due to transportation concerns? {YES/NO:21197}  [facility] Reviewed: Is patient disadvantaged to use [facility]?: {YES/NO:21197} Current Rx insurance plan: *** Name and location of Current pharmacy[facility]  [id number] [place] [address]Phone: [phone]: [place]Phone: [phone]: [phone] [facility] services reviewed with patient today?: {YES/NO:21197} Patient requests to transfer care to [facility]?: {YES/NO:21197} Reason patient declined to change pharmacies: {US patient preference:27474}  Compliance/Adherence/Medication fill history: Care Gaps: A1c DEXA  Mammogram   Star-Rating Drugs: Losartan 25 mg last filled on 09/21/2022 for a 30-Day supply with HNilesMetformin 500 mg last filled on 11/16/2022 for a 30-Day supply with HFort Hancock  Assessment/Plan  Heart Failure (Goal: manage symptoms and prevent exacerbations) -Uncontrolled -Last ejection fraction: >50% (Date: Aug 2022) -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%) -NYHA Class: {CHL HP [facility] NYHA Class:[phone]} -AHA HF Stage: {CHL HP [facility] AHA HF Stage:[phone]} -Current treatment: Carvedilol 6.25 mg twice daily  Losartan 25 mg daily  -Medications previously tried: ***  -Current  home BP/HR readings: *** -Current home daily weights: *** -Current dietary habits: *** -Current exercise habits: *** -Educated on {CCM  HF Counseling:25125} -{CCMPHARMDINTERVENTION:25122}  Hyperlipidemia: (LDL goal < 70) -Uncontrolled -history of CAD s/p PCI 2010 -Current treatment: Ezetimibe 10 mg daily  -Medications previously tried: Rosuvastatin  -Current dietary patterns: *** -Current exercise habits: *** -Educated on {CCM HLD Counseling:25126} -{CCMPHARMDINTERVENTION:25122}  Diabetes (A1c goal <7%) -Uncontrolled -Current medications: Metformin 500 mg twice daily  -Medications previously tried: Jardiance (Yeast Infections)   -Current home glucose readings fasting glucose: *** post prandial glucose: *** -{ACTIONS;DENIES/REPORTS:[id number]::"Denies"} hypoglycemic/hyperglycemic symptoms -Current meal patterns:  breakfast: ***  lunch: ***  dinner: *** snacks: *** drinks: *** -Current exercise: *** -Educated on {CCM DM COUNSELING:25123} -Counseled to check feet daily and get yearly eye exams -{CCMPHARMDINTERVENTION:25122}  Depression/Anxiety (Goal: ***) -{US controlled/uncontrolled:25276} -Current treatment: Bupropion XL 300 mg daily  Duloxetine 60 mg daily  -Medications previously tried/failed: *** -PHQ9: *** -GAD7: *** -Connected with *** for mental health support -Educated on {CCM mental health counseling:25127} -{CCMPHARMDINTERVENTION:25122}  ***

## 2023-01-14 ENCOUNTER — Ambulatory Visit (INDEPENDENT_AMBULATORY_CARE_PROVIDER_SITE_OTHER): Payer: 59 | Admitting: Family Medicine

## 2023-01-14 ENCOUNTER — Encounter: Payer: Self-pay | Admitting: Family Medicine

## 2023-01-14 ENCOUNTER — Telehealth: Payer: Self-pay

## 2023-01-14 VITALS — BP 133/76 | HR 84 | Temp 98.0°F | Resp 20 | Wt 260.7 lb

## 2023-01-14 DIAGNOSIS — L219 Seborrheic dermatitis, unspecified: Secondary | ICD-10-CM | POA: Diagnosis not present

## 2023-01-14 DIAGNOSIS — M47816 Spondylosis without myelopathy or radiculopathy, lumbar region: Secondary | ICD-10-CM

## 2023-01-14 DIAGNOSIS — G253 Myoclonus: Secondary | ICD-10-CM | POA: Diagnosis not present

## 2023-01-14 DIAGNOSIS — L309 Dermatitis, unspecified: Secondary | ICD-10-CM

## 2023-01-14 DIAGNOSIS — R413 Other amnesia: Secondary | ICD-10-CM | POA: Diagnosis not present

## 2023-01-14 MED ORDER — KETOCONAZOLE 2 % EX SHAM: 1.0000 | MEDICATED_SHAMPOO | CUTANEOUS | 0 refills | Status: DC

## 2023-01-14 MED ORDER — TRIAMCINOLONE ACETONIDE 0.5 % EX OINT: 1.0000 | TOPICAL_OINTMENT | Freq: Two times a day (BID) | CUTANEOUS | 0 refills | Status: DC

## 2023-01-14 NOTE — Assessment & Plan Note (Signed)
New problem Will treat with ketoconazole shampoo

## 2023-01-14 NOTE — Assessment & Plan Note (Signed)
New, intermittent problem Not witnessed during exam today Wide differential Consider stress given recent issues Will send to neuro for further eval/management

## 2023-01-14 NOTE — Assessment & Plan Note (Signed)
PT helped previously, now hurting again Will refer to PT for further therapy

## 2023-01-14 NOTE — Progress Notes (Unsigned)
Care Management & Coordination Services Pharmacy Note  01/14/2023 Name:  [name] [name] MRN:  [id number] DOB:  [birth year]  Summary: ***  Recommendations/Changes made from today's visit: ***  Follow up plan: ***   Subjective: [name] [name] is an 72 y.o. year old female who is a primary patient of Bacigalupo, Dionne Bucy, MD.  The care coordination team was consulted for assistance with disease management and care coordination needs.    Engaged with patient by telephone for initial visit.  Recent office visits: 07/17/2022 [name], Do (PCP Office Visit) for Weakness- Started: Ferrous Sulfate 325 mg, Lab orders placed, DG Knee complete 4 views order placed, Referral to Gastroenterology placed, Patient to follow-up in 1 week    07/04/2022 [name], DO (PCP Office Visit) for Hair loss- No medication changes noted, Lab orders placed, Referral to Psychiatry placed, BP 140/82,   05/31/22: Video visit with Dr. Brita Romp for follow-up.   Recent consult visits: 07/09/2022 [name], [name] PA-C (Wound Care) No medication changes noted, No orders placed,    Hospital visits: None in previous 6 months   Objective:  Lab Results  Component Value Date   CREATININE 1.45 (H) 07/17/2022   BUN 33 (H) 07/17/2022   GFR 113.78 12/13/2016   EGFR 39 (L) 07/17/2022   GFRNONAA >60 04/05/2022   GFRAA 72 12/21/2020   NA 139 07/17/2022   K 4.9 07/17/2022   CALCIUM 8.9 07/17/2022   CO2 21 07/17/2022   GLUCOSE 213 (H) 07/17/2022    Lab Results  Component Value Date/Time   HGBA1C 7.4 (H) 03/03/2022 06:46 AM   HGBA1C 6.8 (H) 01/30/2022 11:32 AM   GFR 113.78 12/13/2016 10:56 AM   MICROALBUR 100 02/03/2020 11:28 AM   MICROALBUR 20 08/15/2017 11:35 AM    Last diabetic Eye exam:  Lab Results  Component Value Date/Time   HMDIABEYEEXA No Retinopathy 01/08/2023 12:00 AM    Last diabetic Foot exam: No results found for: "HMDIABFOOTEX"   Lab Results  Component Value Date   CHOL  238 (H) 04/19/2022   HDL 51 04/19/2022   LDLCALC 159 (H) 04/19/2022   LDLDIRECT 183.0 12/13/2016   TRIG 153 (H) 04/19/2022   CHOLHDL 4.4 10/24/2021       Latest Ref Rng & Units 04/05/2022   11:57 AM 03/14/2022    3:50 AM 03/13/2022    1:45 PM  Hepatic Function  Total Protein 6.5 - 8.1 g/dL 7.2  5.5  7.0   Albumin 3.5 - 5.0 g/dL 2.9  2.1  2.7   AST 15 - 41 U/L 11  11  15   $ ALT 0 - 44 U/L 10  9  11   $ Alk Phosphatase 38 - 126 U/L 64  52  70   Total Bilirubin 0.3 - 1.2 mg/dL 0.3  0.4  0.4     Lab Results  Component Value Date/Time   TSH 2.100 07/04/2022 02:46 PM   TSH 2.160 07/18/2018 09:06 AM   FREET4 0.93 12/13/2016 10:56 AM       Latest Ref Rng & Units 07/17/2022    3:32 PM 07/04/2022    2:46 PM 04/05/2022   11:57 AM  CBC  WBC 3.4 - 10.8 x10E3/uL 10.1  6.4  5.6   Hemoglobin 11.1 - 15.9 g/dL 9.9  9.9  9.6   Hematocrit 34.0 - 46.6 % 31.8  32.7  33.2   Platelets 150 - 450 x10E3/uL 457  352  360     Lab Results  Component Value Date/Time   VD25OH 33.0 02/03/2020 11:24 AM   VD25OH 27.9 (L) 07/15/2019 10:26 AM   VITAMINB12 429 07/17/2022 03:32 PM   VITAMINB12 234 03/04/2022 01:07 AM    Clinical ASCVD: Yes  The 10-year ASCVD risk score (Arnett DK, et al., 2019) is: 34.8%   Values used to calculate the score:     Age: 79 years     Sex: Female     Is Non-Hispanic African American: No     Diabetic: Yes     Tobacco smoker: No     Systolic Blood Pressure: XX123456 mmHg     Is BP treated: Yes     HDL Cholesterol: 51 mg/dL     Total Cholesterol: 238 mg/dL       07/17/2022    2:51 PM 07/04/2022    1:48 PM 06/19/2022   11:03 AM  Depression screen PHQ 2/9  Decreased Interest 1 1 0  Down, Depressed, Hopeless 2 1 0  PHQ - 2 Score 3 2 0  Altered sleeping 1 0   Tired, decreased energy 3 2   Change in appetite 1 1   Feeling bad or failure about yourself  0 1   Trouble concentrating 2 0   Moving slowly or fidgety/restless 0 1   Suicidal thoughts 1 0   PHQ-9 Score 11 7   Difficult  doing work/chores Extremely dIfficult Not difficult at all      Social History   Tobacco Use  Smoking Status Former   Packs/day: 1.00   Years: 29.00   Total pack years: 29.00   Types: Cigarettes   Quit date: 12/03/1996   Years since quitting: 26.1   Passive exposure: Past  Smokeless Tobacco Never   BP Readings from Last 3 Encounters:  01/07/23 (!) 166/97  07/17/22 (!) 83/53  07/04/22 (!) 140/82   Pulse Readings from Last 3 Encounters:  01/07/23 (!) 103  07/17/22 82  07/04/22 94   Wt Readings from Last 3 Encounters:  01/07/23 260 lb (117.9 kg)  07/17/22 240 lb (108.9 kg)  07/04/22 241 lb 4.8 oz (109.5 kg)   BMI Readings from Last 3 Encounters:  01/07/23 40.72 kg/m  07/17/22 37.59 kg/m  07/04/22 37.79 kg/m    Allergies  Allergen Reactions   Sulfa Antibiotics Itching, Rash and Swelling   Latex Itching    Other reaction(s): angioedema   Doxycycline Rash   Empagliflozin Itching and Other (See Comments)    Itching and frequent yeast infections    Medications Reviewed Today     Reviewed by Despina Hidden, CMA (Certified Medical Assistant) on 01/07/23 at [place] List Status: <None>   Medication Order Taking? Sig Documenting Provider Last Dose Status Informant  Biotin w/ Vitamins C & E (HAIR/SKIN/NAILS PO) [id number] Yes Take 1 tablet by mouth daily. [provider] Taking Active Self  buPROPion (WELLBUTRIN XL) 300 MG 24 hr tablet [id number] Yes TAKE ONE TABLET BY MOUTH DAILY Bacigalupo, Dionne Bucy, MD Taking Active   carvedilol (COREG) 6.25 MG tablet [id number] Yes Take 1 tablet (6.25 mg total) by mouth 2 (two) times daily. Myles Gip, DO Taking Active   DULoxetine (CYMBALTA) 60 MG capsule [id number] Yes Take 1 capsule (60 mg total) by mouth daily. Gwyneth Sprout, FNP Taking Active   ezetimibe (ZETIA) 10 MG tablet [id number] Yes TAKE 1 TABLET BY MOUTH EVERY DAY Brita Romp Dionne Bucy, MD Taking Active Self  GEMTESA 75 MG TABS [id number] Yes Take 1 tablet  by  mouth daily. [provider] Taking Active   Iron, Ferrous Sulfate, 325 (65 Fe) MG TABS [id number] Yes Take 325 mg by mouth daily. Myles Gip, DO Taking Active   losartan (COZAAR) 25 MG tablet [id number] Yes TAKE ONE TABLET BY MOUTH DAILY Gwyneth Sprout, FNP Taking Active   meloxicam (MOBIC) 15 MG tablet [id number] Yes TAKE 1 TABLET (15 MG TOTAL) BY MOUTH DAILY. Virginia Crews, MD Taking Active   metFORMIN (GLUCOPHAGE) 500 MG tablet [id number] Yes TAKE ONE TABLET BY MOUTH TWICE DAILY Bacigalupo, Dionne Bucy, MD Taking Active   polyethylene glycol (MIRALAX / GLYCOLAX) 17 g packet [id number] Yes Take 17 g by mouth daily as needed for mild constipation. Bonnielee Haff, MD Taking Active Self  pregabalin (LYRICA) 75 MG capsule [id number] Yes TAKE ONE CAPSULE BY MOUTH THREE TIMES DAILY Bacigalupo, Dionne Bucy, MD Taking Active   tolterodine (DETROL LA) 4 MG 24 hr capsule [id number] Yes Take 4 mg by mouth daily. [provider] Taking Active Self           Med Note Oneita Jolly Jan 07, 2023 10:18 AM)              SDOH:  (Social Determinants of Health) assessments and interventions performed: Yes SDOH Interventions    Flowsheet Row Video Visit from 05/31/2022 in [place] Office Visit from 04/19/2022 in [place] Office Visit from 07/18/2021 in [place] from 02/13/2021 in [place] Video Visit from 12/26/2020 in [place] Video Visit from 11/11/2020 in [place]  SDOH Interventions        Depression Interventions/Treatment  Currently on Treatment Currently on Treatment Currently on Treatment Currently on Treatment Currently on Treatment Medication       Medication Assistance: {MEDASSISTANCEINFO:25044}  Medication Access: Within the past 30 days, how often has patient missed a  dose of medication? *** Is a pillbox or other method used to improve adherence? {YES/NO:21197} Factors that may affect medication adherence? {CHL DESC; BARRIERS:21522} Are meds synced by current pharmacy? {YES/NO:21197} Are meds delivered by current pharmacy? {YES/NO:21197} Does patient experience delays in picking up medications due to transportation concerns? {YES/NO:21197}  [facility] Reviewed: Is patient disadvantaged to use [facility]?: {YES/NO:21197} Current Rx insurance plan: *** Name and location of [facility] pharmacy[facility]  [id number] [place] [address]Phone: [phone]: [place] [address] [place]Phone: [phone]: [phone] [facility] services reviewed with patient today?: {YES/NO:21197} Patient requests to transfer care to [facility]?: {YES/NO:21197} Reason patient declined to change pharmacies: {US patient preference:27474}  Compliance/Adherence/Medication fill history: Care Gaps: A1c DEXA  Mammogram   Star-Rating Drugs: Losartan 25 mg last filled on 09/21/2022 for a 30-Day supply with HBethanyMetformin 500 mg last filled on 11/16/2022 for a 30-Day supply with HIrion  Assessment/Plan  Heart Failure (Goal: manage symptoms and prevent exacerbations) -Uncontrolled -Last ejection fraction: >50% (Date: Aug 2022) -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%) -NYHA Class: {CHL HP [facility] NYHA Class:[phone]} -AHA HF Stage: {CHL HP [facility] AHA HF Stage:[phone]} -Current treatment: Carvedilol 6.25 mg twice daily  Losartan 25 mg daily  -Medications previously tried: ***  -Current  home BP/HR readings: *** -Current home daily weights: *** -Current dietary habits: *** -Current exercise habits: *** -Educated on {CCM  HF Counseling:25125} -{CCMPHARMDINTERVENTION:25122}  Hyperlipidemia: (LDL goal < 70) -Uncontrolled -history of CAD s/p PCI 2010 -Current treatment: Ezetimibe 10 mg daily  -Medications previously tried: Rosuvastatin  -Current dietary patterns: *** -Current exercise habits: *** -Educated on {CCM HLD Counseling:25126} -{CCMPHARMDINTERVENTION:25122}  Diabetes (A1c goal <7%) -Uncontrolled -Current medications: Metformin 500 mg twice daily  -Medications previously tried: Jardiance (Yeast Infections)   -Current home glucose readings fasting glucose: *** post prandial glucose: *** -{ACTIONS;DENIES/REPORTS:21021675::"Denies"} hypoglycemic/hyperglycemic symptoms -Current meal patterns:  breakfast: ***  lunch: ***  dinner: *** snacks: *** drinks: *** -Current exercise: *** -Educated on {CCM DM COUNSELING:25123} -Counseled to check feet daily and get yearly eye exams -{CCMPHARMDINTERVENTION:25122}  Depression/Anxiety (Goal: ***) -{US controlled/uncontrolled:25276} -Current treatment: Bupropion XL 300 mg daily  Duloxetine 60 mg daily  -Medications previously tried/failed: *** -PHQ9: *** -GAD7: *** -Connected with *** for mental health support -Educated on {CCM mental health counseling:25127} -{CCMPHARMDINTERVENTION:25122}  ***

## 2023-01-14 NOTE — Assessment & Plan Note (Signed)
Worsening MMSE 29/30, but subjective memory concerns Will refer to neuro for further eval and management

## 2023-01-14 NOTE — Telephone Encounter (Signed)
Care Management & Coordination Services Outreach Note  01/14/2023 Name: [name] [name] MRN: [id number] DOB: 1951/09/13  Referred by: Virginia Crews, MD  Patient had a phone appointment scheduled with clinical pharmacist today.  An unsuccessful telephone outreach was attempted today. The patient was referred to the pharmacist for assistance with medications, care management and care coordination.   Patient will NOT be penalized in any way for missing a Care Management & Coordination Services appointment. The no-show fee does not apply.  Junius Argyle, PharmD, Para [place], CPP  Clinical Pharmacist Practitioner  [facility] [phone]

## 2023-01-14 NOTE — Assessment & Plan Note (Signed)
New problem Rash is c/w eczema Will treat with triamcinolone ointment BID prn

## 2023-01-15 ENCOUNTER — Encounter: Payer: Self-pay | Admitting: Family Medicine

## 2023-01-28 ENCOUNTER — Telehealth: Payer: Self-pay | Admitting: Family Medicine

## 2023-01-29 ENCOUNTER — Ambulatory Visit: Payer: 59

## 2023-01-29 DIAGNOSIS — I2510 Atherosclerotic heart disease of native coronary artery without angina pectoris: Secondary | ICD-10-CM

## 2023-01-29 DIAGNOSIS — F411 Generalized anxiety disorder: Secondary | ICD-10-CM

## 2023-01-29 DIAGNOSIS — F330 Major depressive disorder, recurrent, mild: Secondary | ICD-10-CM

## 2023-01-29 DIAGNOSIS — E118 Type 2 diabetes mellitus with unspecified complications: Secondary | ICD-10-CM

## 2023-01-29 DIAGNOSIS — I5022 Chronic systolic (congestive) heart failure: Secondary | ICD-10-CM

## 2023-01-29 NOTE — Progress Notes (Signed)
Care Management & Coordination Services Pharmacy Note  01/29/2023 Name:  [name] [name] MRN:  [id number] DOB:  1951/06/01  Summary: Patient presents for initial pharmacy consult.   -Patient quit taking her carvedilol, states it made her "feel so bad." Losartan was taken off medication list, but patient is still taking this medication.She reports hypotensive symptoms: Dizziness most mornings after taking her AM medications.   -Given history of CAD and intolerance to statins, patient would be good candidate for PCSK9 inhibitor.   -Patient reports she is still taking metformin but has noted it has been causing her to feel "bad" and tired. She was interested in getting back on Trulicity if possible.   -Patient reports worsening depression/anxiety symptoms. Her primary stressor includes financial challenges with housing bills. Because Bupropion use results in CNS activation, it may be contributing to worsening symptoms of anxiety. Could consider alternative augmentation agent such as buspirone.   Recommendations/Changes made from today's visit: -Recheck BMP, Vit D, A1c, CBC, Lipid profile at PCP follow-up, then:  -START Repatha 140 mg every 2 weeks.  -START Trulicity [id number] mg weekly for four weeks then increase to 1.5 mg weekly.  -Recommend recheck of mammogram/DEXA  Follow up plan:  -Will ask LCSW to reach back out to patient to see if any further assistance options are available to help the patient.  -CPP follow-up 1 month   Subjective: [name] [name] is an 72 y.o. year old female who is a primary patient of Bacigalupo, Dionne Bucy, MD.  The care coordination team was consulted for assistance with disease management and care coordination needs.    Engaged with patient by telephone for initial visit.  Recent office visits: 01/14/23: Patient presented to Dr. Brita Romp for follow-up. Ferrous Sulfate, Losartan, Meloxicam.   07/17/2022 [name], Do (PCP Office Visit) for Weakness- Started:  Ferrous Sulfate 325 mg, Lab orders placed, DG Knee complete 4 views order placed, Referral to Gastroenterology placed, Patient to follow-up in 1 week   07/04/2022 [name], DO (PCP Office Visit) for Hair loss- No medication changes noted, Lab orders placed, Referral to Psychiatry placed, BP 140/82,   Recent consult visits: 07/09/2022 [name], [name] PA-C (Wound Care) No medication changes noted, No orders placed,    Hospital visits: None in previous 6 months   Objective:  Lab Results  Component Value Date   CREATININE 1.45 (H) 07/17/2022   BUN 33 (H) 07/17/2022   GFR 113.78 12/13/2016   EGFR 39 (L) 07/17/2022   GFRNONAA >60 04/05/2022   GFRAA 72 12/21/2020   NA 139 07/17/2022   K 4.9 07/17/2022   CALCIUM 8.9 07/17/2022   CO2 21 07/17/2022   GLUCOSE 213 (H) 07/17/2022    Lab Results  Component Value Date/Time   HGBA1C 7.4 (H) 03/03/2022 06:46 AM   HGBA1C 6.8 (H) 01/30/2022 11:32 AM   GFR 113.78 12/13/2016 10:56 AM   MICROALBUR 100 02/03/2020 11:28 AM   MICROALBUR 20 08/15/2017 11:35 AM    Last diabetic Eye exam:  Lab Results  Component Value Date/Time   HMDIABEYEEXA No Retinopathy 01/08/2023 12:00 AM   HMDIABEYEEXA No Retinopathy 01/08/2023 12:00 AM    Last diabetic Foot exam: No results found for: "HMDIABFOOTEX"   Lab Results  Component Value Date   CHOL 238 (H) 04/19/2022   HDL 51 04/19/2022   LDLCALC 159 (H) 04/19/2022   LDLDIRECT 183.0 12/13/2016   TRIG 153 (H) 04/19/2022   CHOLHDL 4.4 10/24/2021       Latest Ref Rng &  Units 04/05/2022   11:57 AM 03/14/2022    3:50 AM 03/13/2022    1:45 PM  Hepatic Function  Total Protein 6.5 - 8.1 g/dL 7.2  5.5  7.0   Albumin 3.5 - 5.0 g/dL 2.9  2.1  2.7   AST 15 - 41 U/L '11  11  15   '$ ALT 0 - 44 U/L '10  9  11   '$ Alk Phosphatase 38 - 126 U/L 64  52  70   Total Bilirubin 0.3 - 1.2 mg/dL 0.3  0.4  0.4     Lab Results  Component Value Date/Time   TSH 2.100 07/04/2022 02:46 PM   TSH 2.160 07/18/2018 09:06 AM    FREET4 0.93 12/13/2016 10:56 AM       Latest Ref Rng & Units 07/17/2022    3:32 PM 07/04/2022    2:46 PM 04/05/2022   11:57 AM  CBC  WBC 3.4 - 10.8 x10E3/uL 10.1  6.4  5.6   Hemoglobin 11.1 - 15.9 g/dL 9.9  9.9  9.6   Hematocrit 34.0 - 46.6 % 31.8  32.7  33.2   Platelets 150 - 450 x10E3/uL 457  352  360     Lab Results  Component Value Date/Time   VD25OH 33.0 02/03/2020 11:24 AM   VD25OH 27.9 (L) 07/15/2019 10:26 AM   VITAMINB12 429 07/17/2022 03:32 PM   VITAMINB12 234 03/04/2022 01:07 AM    Clinical ASCVD: Yes  The 10-year ASCVD risk score (Arnett DK, et al., 2019) is: 23.9%   Values used to calculate the score:     Age: 3 years     Sex: Female     Is Non-Hispanic African American: No     Diabetic: Yes     Tobacco smoker: No     Systolic Blood Pressure: Q000111Q mmHg     Is BP treated: Yes     HDL Cholesterol: 51 mg/dL     Total Cholesterol: 238 mg/dL       01/14/2023   10:44 AM 07/17/2022    2:51 PM 07/04/2022    1:48 PM  Depression screen PHQ 2/9  Decreased Interest '1 1 1  '$ Down, Depressed, Hopeless '1 2 1  '$ PHQ - 2 Score '2 3 2  '$ Altered sleeping 1 1 0  Tired, decreased energy '1 3 2  '$ Change in appetite '1 1 1  '$ Feeling bad or failure about yourself  0 0 1  Trouble concentrating 1 2 0  Moving slowly or fidgety/restless 0 0 1  Suicidal thoughts 0 1 0  PHQ-9 Score '6 11 7  '$ Difficult doing work/chores Somewhat difficult Extremely dIfficult Not difficult at all     Social History   Tobacco Use  Smoking Status Former   Packs/day: 1.00   Years: 29.00   Total pack years: 29.00   Types: Cigarettes   Quit date: 12/03/1996   Years since quitting: 26.1   Passive exposure: Past  Smokeless Tobacco Never   BP Readings from Last 3 Encounters:  01/14/23 133/76  01/07/23 (!) 166/97  07/17/22 (!) 83/53   Pulse Readings from Last 3 Encounters:  01/14/23 84  01/07/23 (!) 103  07/17/22 82   Wt Readings from Last 3 Encounters:  01/14/23 260 lb 11.2 oz (118.3 kg)  01/07/23  260 lb (117.9 kg)  07/17/22 240 lb (108.9 kg)   BMI Readings from Last 3 Encounters:  01/14/23 40.83 kg/m  01/07/23 40.72 kg/m  07/17/22 37.59 kg/m    Allergies  Allergen  Reactions   Sulfa Antibiotics Itching, Rash and Swelling   Latex Itching    Other reaction(s): angioedema   Doxycycline Rash   Empagliflozin Itching and Other (See Comments)    Itching and frequent yeast infections    Medications Reviewed Today     Reviewed by Germaine Pomfret, [facility] (Pharmacist) on 01/29/23 at 1041  Med List Status: <None>   Medication Order Taking? Sig Documenting Provider Last Dose Status Informant  Biotin w/ Vitamins C & E (HAIR/SKIN/NAILS PO) [id number]  Take 1 tablet by mouth daily. [provider]  Active Self  buPROPion (WELLBUTRIN XL) 300 MG 24 hr tablet [id number] Yes TAKE ONE TABLET BY MOUTH DAILY Bacigalupo, Dionne Bucy, MD Taking Active   DULoxetine (CYMBALTA) 60 MG capsule [id number] Yes Take 1 capsule (60 mg total) by mouth daily. Gwyneth Sprout, FNP Taking Active   GEMTESA 75 MG TABS [id number] No Take 1 tablet (75 mg total) by mouth daily.  Patient not taking: Reported on 01/29/2023   Bjorn Loser, MD Not Taking Active   ketoconazole (NIZORAL) 2 % shampoo [id number] Yes Apply 1 Application topically 2 (two) times a week. Virginia Crews, MD Taking Active   losartan (COZAAR) 25 MG tablet [id number] Yes Take 25 mg by mouth daily. [provider] Taking Active   metFORMIN (GLUCOPHAGE) 500 MG tablet [id number] Yes TAKE ONE TABLET BY MOUTH TWICE DAILY Bacigalupo, Dionne Bucy, MD Taking Active   polyethylene glycol (MIRALAX / GLYCOLAX) 17 g packet [id number] Yes Take 17 g by mouth daily as needed for mild constipation. Bonnielee Haff, MD Taking Active Self  pregabalin (LYRICA) 75 MG capsule [id number] Yes TAKE ONE CAPSULE BY MOUTH THREE TIMES DAILY Bacigalupo, Dionne Bucy, MD Taking Active   tolterodine (DETROL LA) 4 MG 24 hr capsule [id number] Yes Take 1 capsule (4 mg  total) by mouth daily. Bjorn Loser, MD Taking Active   triamcinolone ointment (KENALOG) 0.5 % [id number] Yes Apply 1 Application topically 2 (two) times daily. Virginia Crews, MD Taking Active             SDOH:  (Social Determinants of Health) assessments and interventions performed: Yes SDOH Interventions    [place] Coordination from 01/29/2023 in [place] Video Visit from 05/31/2022 in [place] Office Visit from 04/19/2022 in [place] Office Visit from 07/18/2021 in [place] from 02/13/2021 in [place] Video Visit from 12/26/2020 in [place]  SDOH Interventions        Food Insecurity Interventions Intervention Not Indicated -- -- -- -- --  Depression Interventions/Treatment  -- Currently on Treatment Currently on Treatment Currently on Treatment Currently on Treatment Currently on Treatment  Financial Strain Interventions Other (Comment)  [LCSW Referral] -- -- -- -- --       Medication Assistance: None required.  Patient affirms current coverage meets needs.  Medication Access: Within the past 30 days, how often has patient missed a dose of medication? None Is a pillbox or other method used to improve adherence? Yes  - Adherence Packaging via [facility] Factors that may affect medication adherence? no barriers identified Are meds synced by current pharmacy? Yes  Are meds delivered by current pharmacy? Yes  Does patient experience delays in picking up medications due to transportation concerns? No   [facility] Services Reviewed: Is patient disadvantaged to use [facility]?: No  Current Rx insurance plan: [facility] Name and  location of Current pharmacy:  [id number] [place] [id number]: [phone]:  [place] [address] [id number]: [phone]: [phone] [facility] services reviewed with patient today?: No  Patient requests to transfer care to [facility]?: No  Reason patient declined to change pharmacies: Loyalty to other pharmacy/Patient preference  Compliance/Adherence/Medication fill history: Care Gaps: A1c DEXA  Mammogram   Star-Rating Drugs: Losartan 25 mg last filled on 09/21/2022 for a 30-Day supply with HElvertaMetformin 500 mg last filled on 11/16/2022 for a 30-Day supply with [place]  Assessment/Plan  Heart Failure (Goal: manage symptoms and prevent exacerbations) -Uncontrolled -Last ejection fraction: >50% (Date: Aug 2022) -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%) -NYHA Class: II (slight limitation of activity) -AHA HF Stage: B (Heart disease present - no symptoms present) -Current treatment: Losartan 25 mg daily  -Medications previously tried: Carvedilol  -Patient quit taking her carvedilol, states it made her "feel so bad." Losartan was taken off medication list, but patient is still taking this medication. -Current home BP/HR readings: 134/78  Typical range 130s/70s  -Reports hypotensive symptoms: Dizziness most mornings after taking her AM medications.  -Current home daily weights: NA -Recommended to continue current medication  Hyperlipidemia: (LDL goal < 55) -Uncontrolled -history of CAD s/p PCI 2010 -Current treatment: None -Medications previously tried: Rosuvastatin, Zetia  -Given history of CAD and intolerance to statins, patient would be good candidate for PCSK9 inhibitor. Both Repatha and Praluent appear to be T3 drugs on patient's formulary.  -Recheck FLP at PCP follow-up, then start Repatha 140 mg every 2 weeks.   Diabetes (A1c goal <7%) -Uncontrolled -Current  medications: Metformin 500 mg twice daily  -Medications previously tried: Jardiance (Yeast Infections), Trulicity    -Patient reports she is still taking metformin but has noted it has been causing her to feel "bad" and tired. She was interested in getting back on Trulicity if possible. Trulicity appears to have been stopped in Jan 2022 as the patient was not taking, but patient does not remember why she stopped this medication. Patient believes she was tolerating this medication well without side effects.  -Current home glucose readings -Denies hypoglycemic/hyperglycemic symptoms -Recheck A1c at PCP follow-up, then start Trulicity [id number] weekly for four weeks then increase to 1.5 mg weekly.   Depression/Anxiety (Goal: Achieve symptom remission) -Uncontrolled -Current treatment: Bupropion XL 300 mg daily  Duloxetine 60 mg daily  -Medications previously tried/failed: NA -PHQ9: 6 -GAD7: 10 -Patient reports worsening depression/anxiety symptoms. Her primary stressor includes financial challenges with housing bills.  -Because Bupropion use results in CNS activation, it may be contributing to worsening symptoms of anxiety. Could consider alternative augmentation agent such as buspirone.  -Will ask LCSW to reach back out to patient to see if any further assistance options are available to help the patient.  -Recommended to continue current medication  AJunius Argyle PharmD, BPara March CMiamiPharmacist Practitioner  [phone]

## 2023-01-31 ENCOUNTER — Other Ambulatory Visit: Payer: Self-pay | Admitting: Family Medicine

## 2023-02-01 ENCOUNTER — Telehealth: Payer: Self-pay | Admitting: *Deleted

## 2023-02-01 NOTE — Telephone Encounter (Signed)
Requested medication (s) are due for refill today:   Requested medication (s) are on the active medication list:   Last refill:  ?  Future visit scheduled: Yes  Notes to clinic:  Historical provider.    Requested Prescriptions  Pending Prescriptions Disp Refills   losartan (COZAAR) 25 MG tablet [Pharmacy Med Name: losartan 25 mg tablet] 90 tablet 0    Sig: TAKE ONE TABLET BY MOUTH DAILY     Cardiovascular:  Angiotensin Receptor Blockers Failed - 01/31/2023  3:44 PM      Failed - Cr in normal range and within 180 days    Creatinine  Date Value Ref Range Status  08/31/2014 0.63 0.60 - 1.30 mg/dL Final   Creatinine, Ser  Date Value Ref Range Status  07/17/2022 1.45 (H) 0.57 - 1.00 mg/dL Final         Failed - K in normal range and within 180 days    Potassium  Date Value Ref Range Status  07/17/2022 4.9 3.5 - 5.2 mmol/L Final  08/31/2014 4.0 3.5 - 5.1 mmol/L Final         Passed - Patient is not pregnant      Passed - Last BP in normal range    BP Readings from Last 1 Encounters:  01/14/23 133/76         Passed - Valid encounter within last 6 months    Recent Outpatient Visits           2 weeks ago Eczema, unspecified type   [place], Dionne Bucy, MD   6 months ago Anemia, unspecified type   [facility] Myles Gip, DO   7 months ago Hair loss   [facility] Myles Gip, DO   8 months ago Financial insecurity   [place], Dionne Bucy, MD   9 months ago Encounter for annual wellness visit (AWV) in Medicare patient   [place], Dionne Bucy, MD       Future Appointments             In 1 week Bacigalupo, Dionne Bucy, MD [facility], [facility]

## 2023-02-01 NOTE — Patient Outreach (Signed)
  Care Coordination   Initial Visit Note   02/01/2023 Name: [name] [name] MRN: [id number] DOB: 06/16/1951  [name] [name] is a 72 y.o. year old female who sees Bacigalupo, Dionne Bucy, MD for primary care. I spoke with  [name] by phone today.  What matters to the patients health and wellness today?  Patient discussed hisotry of multiple surgeries and upcoming procedures. Patient confirms that her financial circumstances have improved. Discussed having no financial needs at [facility] time. Per patient, she has all of her medications and her household bills have been paid. Patient encouraged to contact this social worker with any additional community resource needs.   Goals Addressed             This Visit's Progress    care coordination activites       Care Coordination Interventions:  SDOH screen completed Patient confirmed having no financial needs at this time          SDOH assessments and interventions completed:  Yes  SDOH Interventions Today    Flowsheet Row Most Recent Value  SDOH Interventions   Food Insecurity Interventions Intervention Not Indicated  Housing Interventions Intervention Not Indicated        Care Coordination Interventions:  Yes, provided  Interventions Today    Flowsheet Row Most Recent Value  Chronic Disease   Chronic disease during today's visit Diabetes, Congestive Heart Failure (CHF)  General Interventions   General Interventions Discussed/Reviewed [employer] resources offered to address financial needs-patient confirms that all finacial needs are met at this time]  [place] Discussed/Reviewed Other  [patient confirms having a strong faith and family and friends to rely on]       Follow up plan: No further intervention required.   Encounter Outcome:  Pt. Visit Completed

## 2023-02-01 NOTE — Patient Instructions (Signed)
Visit Information  Thank you for taking time to visit with me today. Please don't hesitate to contact me if I can be of assistance to you.   Following are the goals we discussed today:   Goals Addressed             This Visit's Progress    care coordination activites       Care Coordination Interventions:  SDOH screen completed Patient confirmed having no financial needs at this time           Please call the care guide team at [phone] if you need to cancel or reschedule your appointment.   If you are experiencing a Mental Health or [place] or need someone to talk to, please call the Suicide and Crisis Lifeline: 988   The patient verbalized understanding of instructions, educational materials, and care plan provided today and DECLINED offer to receive copy of patient instructions, educational materials, and care plan.   No further follow up required: patient to contact this Education officer, museum with any additional community resource needs.  Elliot Gurney, [place] Worker  [facility] Care Management [phone]

## 2023-02-12 ENCOUNTER — Ambulatory Visit: Payer: 59 | Admitting: Family Medicine

## 2023-02-13 NOTE — Progress Notes (Unsigned)
I,Sulibeya S Dimas,acting as a Education administrator for Lavon Paganini, MD.,have documented all relevant documentation on the behalf of Lavon Paganini, MD,as directed by  Lavon Paganini, MD while in the presence of Lavon Paganini, MD.     Established patient visit   Patient: [name] [name]   DOB: 1951-01-30   72 y.o. Female  MRN: [id number] Visit Date: 02/14/2023  Today's healthcare provider: Lavon Paganini, MD   Chief Complaint  Patient presents with   Diabetes   Hypertension   Subjective    HPI  Diabetes Mellitus Type II, follow-up  Lab Results  Component Value Date   HGBA1C 7.4 (A) 02/14/2023   HGBA1C 7.4 (H) 03/03/2022   HGBA1C 6.8 (H) 01/30/2022   Last seen for diabetes 7 months ago.  Management since then includes continuing the same treatment. She reports excellent compliance with treatment. She is not having side effects.  Home blood sugar records: fasting range: low 100s  Episodes of hypoglycemia? No 84 dizzy last night   Current insulin regiment: none Most Recent Eye Exam: UTD  --------------------------------------------------------------------------------------------------- Hypertension, follow-up  BP Readings from Last 3 Encounters:  02/14/23 139/79  01/14/23 133/76  01/07/23 (!) 166/97   Wt Readings from Last 3 Encounters:  02/14/23 260 lb (117.9 kg)  01/14/23 260 lb 11.2 oz (118.3 kg)  01/07/23 260 lb (117.9 kg)     She was last seen for hypertension 6 months ago.  BP at that visit was 83/53. Management since that visit includes hold losartan. Fu in 1 week for BP check.  She reports excellent compliance with treatment. She is not having side effects.   Outside blood pressures are stable in 140s.   Lipid/Cholesterol, follow-up  Last Lipid Panel: Lab Results  Component Value Date   CHOL 238 (H) 04/19/2022   LDLCALC 159 (H) 04/19/2022   LDLDIRECT 183.0 12/13/2016   HDL 51 04/19/2022   TRIG 153 (H) 04/19/2022    She was last seen  for this 10 months ago.  Management since that visit includes Continue statin .  She reports excellent compliance with treatment. She is not having side effects.   Last metabolic panel Lab Results  Component Value Date   GLUCOSE 213 (H) 07/17/2022   NA 139 07/17/2022   K 4.9 07/17/2022   BUN 33 (H) 07/17/2022   CREATININE 1.45 (H) 07/17/2022   EGFR 39 (L) 07/17/2022   GFRNONAA >60 04/05/2022   CALCIUM 8.9 07/17/2022   AST 11 (L) 04/05/2022   ALT 10 04/05/2022   The 10-year ASCVD risk score (Arnett DK, et al., 2019) is: 25.8%  ---------------------------------------------------------------------------------------------------   Medications: Outpatient Medications Prior to Visit  Medication Sig   acetaminophen (TYLENOL) 500 MG tablet Take 500 mg by mouth every 6 (six) hours as needed.   acetaminophen (TYLENOL) 650 MG CR tablet Take 650 mg by mouth every 8 (eight) hours as needed for pain.   Biotin w/ Vitamins C & E (HAIR/SKIN/NAILS PO) Take 1 tablet by mouth daily.   buPROPion (WELLBUTRIN XL) 300 MG 24 hr tablet TAKE ONE TABLET BY MOUTH DAILY   DULoxetine (CYMBALTA) 60 MG capsule Take 1 capsule (60 mg total) by mouth daily.   GEMTESA 75 MG TABS Take 1 tablet (75 mg total) by mouth daily.   ketoconazole (NIZORAL) 2 % shampoo Apply 1 Application topically 2 (two) times a week.   metFORMIN (GLUCOPHAGE) 500 MG tablet TAKE ONE TABLET BY MOUTH TWICE DAILY   polyethylene glycol (MIRALAX / GLYCOLAX) 17 g packet Take  17 g by mouth daily as needed for mild constipation.   pregabalin (LYRICA) 75 MG capsule TAKE ONE CAPSULE BY MOUTH THREE TIMES DAILY   tolterodine (DETROL LA) 4 MG 24 hr capsule Take 1 capsule (4 mg total) by mouth daily.   losartan (COZAAR) 25 MG tablet TAKE ONE TABLET BY MOUTH DAILY (Patient not taking: Reported on 02/14/2023)   [DISCONTINUED] triamcinolone ointment (KENALOG) 0.5 % Apply 1 Application topically 2 (two) times daily.   No facility-administered medications  prior to visit.    Review of Systems  Constitutional:  Negative for appetite change and fatigue.  Eyes:  Negative for visual disturbance.  Respiratory:  Negative for cough, chest tightness and shortness of breath.   Cardiovascular:  Negative for chest pain and leg swelling.  Gastrointestinal:  Negative for abdominal pain, nausea and vomiting.  Neurological:  Positive for dizziness. Negative for light-headedness and headaches.       Objective    BP 139/79 (BP Location: Left Arm, Patient Position: Sitting, Cuff Size: Normal)   Pulse 83   Temp 98 F (36.7 C) (Temporal)   Resp 16   Wt 260 lb (117.9 kg)   LMP  (LMP Unknown) Comment: age 64  SpO2 99%   BMI 40.72 kg/m    Physical Exam Vitals reviewed.  Constitutional:      General: She is not in acute distress.    Appearance: Normal appearance. She is well-developed. She is not diaphoretic.  HENT:     Head: Normocephalic and atraumatic.  Eyes:     General: No scleral icterus.    Conjunctiva/sclera: Conjunctivae normal.  Neck:     Thyroid: No thyromegaly.  Cardiovascular:     Rate and Rhythm: Normal rate and regular rhythm.     Pulses: Normal pulses.     Heart sounds: Normal heart sounds. No murmur heard. Pulmonary:     Effort: Pulmonary effort is normal. No respiratory distress.     Breath sounds: Normal breath sounds. No wheezing, rhonchi or rales.  Musculoskeletal:     Cervical back: Neck supple.     Right lower leg: No edema.     Left lower leg: No edema.  Lymphadenopathy:     Cervical: No cervical adenopathy.  Skin:    General: Skin is warm and dry.     Findings: No rash.  Neurological:     Mental Status: She is alert and oriented to person, place, and time. Mental status is at baseline.  Psychiatric:        Mood and Affect: Mood normal.        Behavior: Behavior normal.       Results for orders placed or performed in visit on 02/14/23  POCT glycosylated hemoglobin (Hb A1C)  Result Value Ref Range    Hemoglobin A1C 7.4 (A) 4.0 - 5.6 %   Est. average glucose Bld gHb Est-mCnc 166     Assessment & Plan     Problem List Items Addressed This Visit       Cardiovascular and Mediastinum   Hypertension associated with diabetes ([place]) - Primary    Well controlled Continue current medications Recheck metabolic panel F/u in 3 months       Relevant Medications   tirzepatide (MOUNJARO) 2.5 MG/0.5ML Pen   tirzepatide (MOUNJARO) 5 MG/0.5ML Pen   Other Relevant Orders   POCT glycosylated hemoglobin (Hb A1C) (Completed)   Comprehensive metabolic panel     Endocrine   Type 2 diabetes mellitus with complication, without long-term  current use of insulin (HCC)    Uncontrolled, hyperglycemic Discussed goal of A1c<7 Continue metformin Add mounjaro UTD on eye exam, foot exam, UACR On ACEi/ARB Not on Statin Discussed diet and exercise F/u in 3 months       Relevant Medications   tirzepatide (MOUNJARO) 2.5 MG/0.5ML Pen   tirzepatide (MOUNJARO) 5 MG/0.5ML Pen   Other Relevant Orders   POCT glycosylated hemoglobin (Hb A1C) (Completed)   Hyperlipidemia associated with type 2 diabetes mellitus ([place])    Reviewed last lipid panel Not currently on a statin Recheck FLP and CMP Discussed diet and exercise       Relevant Medications   tirzepatide (MOUNJARO) 2.5 MG/0.5ML Pen   tirzepatide (MOUNJARO) 5 MG/0.5ML Pen   Other Relevant Orders   Lipid panel     Other   MDD (major depressive disorder)    Improving, but not to goal Continue cymbalta, wellbutrin Add rexulti 0.'5mg'$  daily      Morbid obesity ([place])    Discussed importance of healthy weight management Discussed diet and exercise       Relevant Medications   tirzepatide (MOUNJARO) 2.5 MG/0.5ML Pen   tirzepatide (MOUNJARO) 5 MG/0.5ML Pen   Other Visit Diagnoses     Anemia, unspecified type       Relevant Orders   CBC        Return in about 3 months (around 05/17/2023) for CPE, AWV.      I, Lavon Paganini, MD, have  reviewed all documentation for this visit. The documentation on 02/14/23 for the exam, diagnosis, procedures, and orders are all accurate and complete.   Bacigalupo, Dionne Bucy, MD, MPH [facility]

## 2023-02-14 ENCOUNTER — Ambulatory Visit (INDEPENDENT_AMBULATORY_CARE_PROVIDER_SITE_OTHER): Payer: 59 | Admitting: Family Medicine

## 2023-02-14 ENCOUNTER — Encounter: Payer: Self-pay | Admitting: Family Medicine

## 2023-02-14 VITALS — BP 139/79 | HR 83 | Temp 98.0°F | Resp 16 | Wt 260.0 lb

## 2023-02-14 DIAGNOSIS — E119 Type 2 diabetes mellitus without complications: Secondary | ICD-10-CM

## 2023-02-14 DIAGNOSIS — I152 Hypertension secondary to endocrine disorders: Secondary | ICD-10-CM

## 2023-02-14 DIAGNOSIS — E1169 Type 2 diabetes mellitus with other specified complication: Secondary | ICD-10-CM

## 2023-02-14 DIAGNOSIS — E785 Hyperlipidemia, unspecified: Secondary | ICD-10-CM

## 2023-02-14 DIAGNOSIS — E1159 Type 2 diabetes mellitus with other circulatory complications: Secondary | ICD-10-CM | POA: Diagnosis not present

## 2023-02-14 DIAGNOSIS — D649 Anemia, unspecified: Secondary | ICD-10-CM | POA: Diagnosis not present

## 2023-02-14 DIAGNOSIS — E118 Type 2 diabetes mellitus with unspecified complications: Secondary | ICD-10-CM | POA: Diagnosis not present

## 2023-02-14 DIAGNOSIS — F330 Major depressive disorder, recurrent, mild: Secondary | ICD-10-CM

## 2023-02-14 LAB — POCT GLYCOSYLATED HEMOGLOBIN (HGB A1C)
Est. average glucose Bld gHb Est-mCnc: 166
Hemoglobin A1C: 7.4 % — AB (ref 4.0–5.6)

## 2023-02-14 MED ORDER — TIRZEPATIDE 5 MG/0.5ML ~~LOC~~ SOAJ: 5.0000 mg | SUBCUTANEOUS | 1 refills | Status: DC

## 2023-02-14 MED ORDER — REXULTI 0.5 MG PO TABS: 0.5000 mg | ORAL_TABLET | Freq: Every day | ORAL | 3 refills | Status: DC

## 2023-02-14 MED ORDER — TIRZEPATIDE 2.5 MG/0.5ML ~~LOC~~ SOAJ: 2.5000 mg | SUBCUTANEOUS | 0 refills | Status: AC

## 2023-02-14 NOTE — Assessment & Plan Note (Signed)
Well controlled Continue current medications Recheck metabolic panel F/u in 3 months  

## 2023-02-14 NOTE — Assessment & Plan Note (Signed)
Reviewed last lipid panel Not currently on a statin Recheck FLP and CMP Discussed diet and exercise  

## 2023-02-14 NOTE — Assessment & Plan Note (Signed)
Improving, but not to goal Continue cymbalta, wellbutrin Add rexulti 0.'5mg'$  daily

## 2023-02-14 NOTE — Assessment & Plan Note (Signed)
Uncontrolled, hyperglycemic Discussed goal of A1c<7 Continue metformin Add mounjaro UTD on eye exam, foot exam, UACR On ACEi/ARB Not on Statin Discussed diet and exercise F/u in 3 months

## 2023-02-14 NOTE — Assessment & Plan Note (Signed)
Discussed importance of healthy weight management Discussed diet and exercise  

## 2023-02-15 ENCOUNTER — Telehealth: Payer: Self-pay

## 2023-02-15 DIAGNOSIS — E1169 Type 2 diabetes mellitus with other specified complication: Secondary | ICD-10-CM

## 2023-02-15 DIAGNOSIS — E1159 Type 2 diabetes mellitus with other circulatory complications: Secondary | ICD-10-CM

## 2023-02-15 LAB — LIPID PANEL
Chol/HDL Ratio: 6.7 ratio — ABNORMAL HIGH (ref 0.0–4.4)
Cholesterol, Total: 321 mg/dL — ABNORMAL HIGH (ref 100–199)
HDL: 48 mg/dL (ref >39)
LDL Chol Calc (NIH): 223 mg/dL — ABNORMAL HIGH (ref 0–99)
Triglycerides: 247 mg/dL — ABNORMAL HIGH (ref 0–149)
VLDL Cholesterol Cal: 50 mg/dL — ABNORMAL HIGH (ref 5–40)

## 2023-02-15 LAB — COMPREHENSIVE METABOLIC PANEL
ALT: 17 IU/L (ref 0–32)
AST: 20 IU/L (ref 0–40)
Albumin/Globulin Ratio: 1.2 (ref 1.2–2.2)
Albumin: 4 g/dL (ref 3.9–4.9)
Alkaline Phosphatase: 137 IU/L — ABNORMAL HIGH (ref 44–121)
BUN/Creatinine Ratio: 29 — ABNORMAL HIGH (ref 12–28)
BUN: 26 mg/dL (ref 8–27)
Bilirubin Total: <0.2 mg/dL (ref 0.0–1.2)
CO2: 21 mmol/L (ref 20–29)
Calcium: 9 mg/dL (ref 8.7–10.3)
Chloride: 102 mmol/L (ref 96–106)
Creatinine, Ser: 0.91 mg/dL (ref 0.57–1.00)
Globulin, Total: 3.3 g/dL (ref 1.5–4.5)
Glucose: 122 mg/dL — ABNORMAL HIGH (ref 70–99)
Potassium: 4.7 mmol/L (ref 3.5–5.2)
Sodium: 139 mmol/L (ref 134–144)
Total Protein: 7.3 g/dL (ref 6.0–8.5)
eGFR: 68 mL/min/{1.73_m2} (ref >59)

## 2023-02-15 LAB — CBC
Hematocrit: 36.1 % (ref 34.0–46.6)
Hemoglobin: 11 g/dL — ABNORMAL LOW (ref 11.1–15.9)
MCH: 23.4 pg — ABNORMAL LOW (ref 26.6–33.0)
MCHC: 30.5 g/dL — ABNORMAL LOW (ref 31.5–35.7)
MCV: 77 fL — ABNORMAL LOW (ref 79–97)
Platelets: 276 10*3/uL (ref 150–450)
RBC: 4.7 x10E6/uL (ref 3.77–5.28)
RDW: 16.4 % — ABNORMAL HIGH (ref 11.7–15.4)
WBC: 5.1 10*3/uL (ref 3.4–10.8)

## 2023-02-15 MED ORDER — EZETIMIBE 10 MG PO TABS: 10.0000 mg | ORAL_TABLET | Freq: Every day | ORAL | 1 refills | Status: DC

## 2023-02-15 NOTE — Telephone Encounter (Signed)
-----   Message from Virginia Crews, MD sent at 02/15/2023 10:02 AM EDT ----- Stable kidney function. Elevated liver function tests.  Recommend cutting back on alcohol and tylenol, and hydrating well.  Recheck LFTs in 1 month.  Cholesterol is significantly elevated.  I recommend a statin, but I know that patient adamantly does not want to take a statin.  Consider Zetia 10 mg daily instead.  This is a nonstatin option.  Okay to send in prescription if patient agrees.  Anemia is improving.

## 2023-02-25 ENCOUNTER — Telehealth: Payer: Self-pay | Admitting: Family Medicine

## 2023-02-25 NOTE — Telephone Encounter (Signed)
Pt is calling she missed her telephone call appt for care coordination appt with Cristie Hem. Please advise CB- [phone]

## 2023-02-25 NOTE — Progress Notes (Unsigned)
Care Management & Coordination Services Pharmacy Note  02/25/2023 Name:  [name] [name] MRN:  [id number] DOB:  1951/06/01  Summary: Patient presents for follow-up pharmacy consult.   -Patient quit taking her carvedilol, states it made her "feel so bad." Losartan was taken off medication list, but patient is still taking this medication.She reports hypotensive symptoms: Dizziness most mornings after taking her AM medications.   -Given history of CAD and intolerance to statins, patient would be good candidate for PCSK9 inhibitor.   -Patient reports she is still taking metformin but has noted it has been causing her to feel "bad" and tired. She was interested in getting back on Trulicity if possible.   -Patient reports worsening depression/anxiety symptoms. Her primary stressor includes financial challenges with housing bills. Because Bupropion use results in CNS activation, it may be contributing to worsening symptoms of anxiety. Could consider alternative augmentation agent such as buspirone.   Recommendations/Changes made from today's visit: -Recheck BMP, Vit D, A1c, CBC, Lipid profile at PCP follow-up, then:  -START Repatha 140 mg every 2 weeks.  -START Trulicity [id number] mg weekly for four weeks then increase to 1.5 mg weekly.  -Recommend recheck of mammogram/DEXA   Follow up plan:  -CPP follow-up 1 month   Subjective: [name] [name] is an 72 y.o. year old female who is a primary patient of Bacigalupo, Dionne Bucy, MD.  The care coordination team was consulted for assistance with disease management and care coordination needs.    Engaged with patient by telephone for initial visit.  Recent office visits: 02/14/23: Patient presented to Dr. Brita Romp for follow-up.   01/14/23: Patient presented to Dr. Brita Romp for follow-up. Ferrous Sulfate, Losartan, Meloxicam.   07/17/2022 [name], Do (PCP Office Visit) for Weakness- Started: Ferrous Sulfate 325 mg, Lab orders placed, DG Knee  complete 4 views order placed, Referral to Gastroenterology placed, Patient to follow-up in 1 week   07/04/2022 [name], DO (PCP Office Visit) for Hair loss- No medication changes noted, Lab orders placed, Referral to Psychiatry placed, BP 140/82,   Recent consult visits: 07/09/2022 [name], [name] PA-C (Wound Care) No medication changes noted, No orders placed,    Hospital visits: None in previous 6 months   Objective:  Lab Results  Component Value Date   CREATININE 0.91 02/14/2023   BUN 26 02/14/2023   GFR 113.78 12/13/2016   EGFR 68 02/14/2023   GFRNONAA >60 04/05/2022   GFRAA 72 12/21/2020   NA 139 02/14/2023   K 4.7 02/14/2023   CALCIUM 9.0 02/14/2023   CO2 21 02/14/2023   GLUCOSE 122 (H) 02/14/2023    Lab Results  Component Value Date/Time   HGBA1C 7.4 (A) 02/14/2023 11:07 AM   HGBA1C 7.4 (H) 03/03/2022 06:46 AM   HGBA1C 6.8 (H) 01/30/2022 11:32 AM   GFR 113.78 12/13/2016 10:56 AM   MICROALBUR 100 02/03/2020 11:28 AM   MICROALBUR 20 08/15/2017 11:35 AM    Last diabetic Eye exam:  Lab Results  Component Value Date/Time   HMDIABEYEEXA No Retinopathy 01/08/2023 12:00 AM   HMDIABEYEEXA No Retinopathy 01/08/2023 12:00 AM    Last diabetic Foot exam: No results found for: "HMDIABFOOTEX"   Lab Results  Component Value Date   CHOL 321 (H) 02/14/2023   HDL 48 02/14/2023   LDLCALC 223 (H) 02/14/2023   LDLDIRECT 183.0 12/13/2016   TRIG 247 (H) 02/14/2023   CHOLHDL 6.7 (H) 02/14/2023       Latest Ref Rng & Units 02/14/2023   11:30  AM 04/05/2022   11:57 AM 03/14/2022    3:50 AM  Hepatic Function  Total Protein 6.0 - 8.5 g/dL 7.3  7.2  5.5   Albumin 3.9 - 4.9 g/dL 4.0  2.9  2.1   AST 0 - 40 IU/L 20  11  11    ALT 0 - 32 IU/L 17  10  9    Alk Phosphatase 44 - 121 IU/L 137  64  52   Total Bilirubin 0.0 - 1.2 mg/dL <0.2  0.3  0.4     Lab Results  Component Value Date/Time   TSH 2.100 07/04/2022 02:46 PM   TSH 2.160 07/18/2018 09:06 AM   FREET4 0.93  12/13/2016 10:56 AM       Latest Ref Rng & Units 02/14/2023   11:30 AM 07/17/2022    3:32 PM 07/04/2022    2:46 PM  CBC  WBC 3.4 - 10.8 x10E3/uL 5.1  10.1  6.4   Hemoglobin 11.1 - 15.9 g/dL 11.0  9.9  9.9   Hematocrit 34.0 - 46.6 % 36.1  31.8  32.7   Platelets 150 - 450 x10E3/uL 276  457  352     Lab Results  Component Value Date/Time   VD25OH 33.0 02/03/2020 11:24 AM   VD25OH 27.9 (L) 07/15/2019 10:26 AM   VITAMINB12 429 07/17/2022 03:32 PM   VITAMINB12 234 03/04/2022 01:07 AM    Clinical ASCVD: Yes  The ASCVD Risk score (Arnett DK, et al., 2019) failed to calculate for the following reasons:   The valid total cholesterol range is 130 to 320 mg/dL       02/14/2023   11:08 AM 02/01/2023   12:31 PM 01/14/2023   10:44 AM  Depression screen PHQ 2/9  Decreased Interest 0 0 1  Down, Depressed, Hopeless 1 0 1  PHQ - 2 Score 1 0 2  Altered sleeping 0  1  Tired, decreased energy 0  1  Change in appetite 1  1  Feeling bad or failure about yourself  0  0  Trouble concentrating 0  1  Moving slowly or fidgety/restless 0  0  Suicidal thoughts 0  0  PHQ-9 Score 2  6  Difficult doing work/chores Not difficult at all  Somewhat difficult     Social History   Tobacco Use  Smoking Status Former   Packs/day: 1.00   Years: 29.00   Additional pack years: 0.00   Total pack years: 29.00   Types: Cigarettes   Quit date: 12/03/1996   Years since quitting: 26.2   Passive exposure: Past  Smokeless Tobacco Never   BP Readings from Last 3 Encounters:  02/14/23 139/79  01/14/23 133/76  01/07/23 (!) 166/97   Pulse Readings from Last 3 Encounters:  02/14/23 83  01/14/23 84  01/07/23 (!) 103   Wt Readings from Last 3 Encounters:  02/14/23 260 lb (117.9 kg)  01/14/23 260 lb 11.2 oz (118.3 kg)  01/07/23 260 lb (117.9 kg)   BMI Readings from Last 3 Encounters:  02/14/23 40.72 kg/m  01/14/23 40.83 kg/m  01/07/23 40.72 kg/m    Allergies  Allergen Reactions   Sulfa Antibiotics  Itching, Rash and Swelling   Latex Itching    Other reaction(s): angioedema   Doxycycline Rash   Empagliflozin Itching and Other (See Comments)    Itching and frequent yeast infections    Medications Reviewed Today     Reviewed by Virginia Crews, MD (Physician) on 02/14/23 at 1107  Med List Status: <  None>   Medication Order Taking? Sig Documenting Provider Last Dose Status Informant  acetaminophen (TYLENOL) 500 MG tablet [id number] Yes Take 500 mg by mouth every 6 (six) hours as needed. [provider] Taking Active   acetaminophen (TYLENOL) 650 MG CR tablet [id number] Yes Take 650 mg by mouth every 8 (eight) hours as needed for pain. [provider] Taking Active   Biotin w/ Vitamins C & E (HAIR/SKIN/NAILS PO) [id number] Yes Take 1 tablet by mouth daily. [provider] Taking Active Self  buPROPion (WELLBUTRIN XL) 300 MG 24 hr tablet [id number] Yes TAKE ONE TABLET BY MOUTH DAILY Bacigalupo, Dionne Bucy, MD Taking Active   DULoxetine (CYMBALTA) 60 MG capsule [id number] Yes Take 1 capsule (60 mg total) by mouth daily. Tally Joe T, FNP Taking Active   GEMTESA 75 MG TABS [id number] Yes Take 1 tablet (75 mg total) by mouth daily. Bjorn Loser, MD Taking Active   ketoconazole (NIZORAL) 2 % shampoo [id number] Yes Apply 1 Application topically 2 (two) times a week. Virginia Crews, MD Taking Active   losartan (COZAAR) 25 MG tablet [id number] No TAKE ONE TABLET BY MOUTH DAILY  Patient not taking: Reported on 02/14/2023   Virginia Crews, MD Not Taking Active   metFORMIN (GLUCOPHAGE) 500 MG tablet [id number] Yes TAKE ONE TABLET BY MOUTH TWICE DAILY Bacigalupo, Dionne Bucy, MD Taking Active   polyethylene glycol (MIRALAX / GLYCOLAX) 17 g packet [id number] Yes Take 17 g by mouth daily as needed for mild constipation. Bonnielee Haff, MD Taking Active Self  pregabalin (LYRICA) 75 MG capsule [id number] Yes TAKE ONE CAPSULE BY MOUTH THREE TIMES DAILY Bacigalupo, Dionne Bucy,  MD Taking Active   tolterodine (DETROL LA) 4 MG 24 hr capsule [id number] Yes Take 1 capsule (4 mg total) by mouth daily. Bjorn Loser, MD Taking Active             SDOH:  (Social Determinants of Health) assessments and interventions performed: Yes SDOH Interventions    Flowsheet Row Office Visit from 02/14/2023 in [facility] Telephone from 02/01/2023 in [facility] Coordination from 01/29/2023 in [facility] Video Visit from 05/31/2022 in [facility] Office Visit from 04/19/2022 in [place] Office Visit from 07/18/2021 in [facility] [place]  SDOH Interventions        Food Insecurity Interventions -- Intervention Not Indicated Intervention Not Indicated -- -- --  Housing Interventions -- Intervention Not Indicated -- -- -- --  Depression Interventions/Treatment  PHQ2-9 Score <4 Follow-up Not Indicated -- -- Currently on Treatment Currently on Treatment Currently on Treatment  Financial Strain Interventions -- -- Other (Comment)  [LCSW Referral] -- -- --       Medication Assistance: None required.  Patient affirms current coverage meets needs.  Medication Access: Within the past 30 days, how often has patient missed a dose of medication? None Is a pillbox or other method used to improve adherence? Yes  - Adherence Packaging via [facility] Factors that may affect medication adherence? no barriers identified Are meds synced by current pharmacy? Yes  Are meds delivered by current pharmacy? Yes  Does patient experience delays in picking up medications due to transportation concerns? No   [facility] Services Reviewed: Is patient disadvantaged to use [facility]?: No  Current Rx insurance plan: [facility] Name and location of Current pharmacy:  [id number] - [place], [place]  [place]  [address] Phone: [phone] Fax: [place], [place] - [address] [place] [address] Phone: [phone] Fax: [phone]  [facility] services reviewed with patient today?: No  Patient requests to transfer care to [facility]?: No  Reason patient declined to change pharmacies: Loyalty to other pharmacy/Patient preference  Compliance/Adherence/Medication fill history: Care Gaps: A1c DEXA  Mammogram   [facility] Drugs: Losartan 25 mg last filled on 09/21/2022 for a 30-Day supply with [facility] Metformin 500 mg last filled on 11/16/2022 for a 30-Day supply with [facility]   Assessment/Plan  Heart Failure (Goal: manage symptoms and prevent exacerbations) -Uncontrolled -Last ejection fraction: >50% (Date: Aug 2022) -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%) -NYHA Class: II (slight limitation of activity) -AHA HF Stage: B (Heart disease present - no symptoms present) -Current treatment: Losartan 25 mg daily  -Medications previously tried: Carvedilol  -Current home BP/HR readings:  -Reports hypotensive symptoms: Dizziness most mornings after taking her AM medications.  -Current home daily weights: NA -Recommended to continue current medication  Hyperlipidemia: (LDL goal < 55) -Uncontrolled -history of CAD s/p PCI 2010 -Current treatment: None -Medications previously tried: Rosuvastatin, Zetia  -Given history of CAD and intolerance to statins, patient would be good candidate for PCSK9 inhibitor. Both Repatha and Praluent appear to be T3 drugs on patient's formulary.  -Recheck FLP at PCP follow-up, then start Repatha 140 mg every 2 weeks.   Diabetes (A1c goal <7%) -Uncontrolled -Current medications: Mounjaro 2.5 mg weekly   -Medications previously tried: Jardiance (Yeast Infections), Trulicity    -Current home glucose readings -Denies  hypoglycemic/hyperglycemic symptoms   Depression/Anxiety (Goal: Achieve symptom remission) -Uncontrolled -Current treatment: Bupropion XL 300 mg daily  Duloxetine 60 mg daily  -Medications previously tried/failed: NA -PHQ9: 6 -GAD7: 10 -Patient reports worsening depression/anxiety symptoms. Her primary stressor includes financial challenges with housing bills.  -Because Bupropion use results in CNS activation, it may be contributing to worsening symptoms of anxiety. Could consider alternative augmentation agent such as buspirone.  -Will ask LCSW to reach back out to patient to see if any further assistance options are available to help the patient.  -Recommended to continue current medication  Junius Argyle, PharmD, [place], [place] Pharmacist Practitioner  [facility] [phone]

## 2023-03-01 ENCOUNTER — Ambulatory Visit: Payer: 59

## 2023-03-01 ENCOUNTER — Other Ambulatory Visit: Payer: Self-pay | Admitting: Family Medicine

## 2023-03-01 DIAGNOSIS — E1169 Type 2 diabetes mellitus with other specified complication: Secondary | ICD-10-CM

## 2023-03-01 DIAGNOSIS — E1159 Type 2 diabetes mellitus with other circulatory complications: Secondary | ICD-10-CM

## 2023-03-01 DIAGNOSIS — E118 Type 2 diabetes mellitus with unspecified complications: Secondary | ICD-10-CM

## 2023-03-01 DIAGNOSIS — I252 Old myocardial infarction: Secondary | ICD-10-CM

## 2023-03-01 DIAGNOSIS — I152 Hypertension secondary to endocrine disorders: Secondary | ICD-10-CM

## 2023-03-01 NOTE — Progress Notes (Unsigned)
Care Management & Coordination Services Pharmacy Note  03/01/2023 Name:  [name] [name] MRN:  [id number] DOB:  [birth year]  Summary: Patient presents for follow-up pharmacy consult.   Recommendations/Changes made from today's visit: Continue current medications  Follow up plan:  -CPP follow-up 1 month   Subjective: [name] [name] is an 72 y.o. year old female who is a primary patient of Bacigalupo, Dionne Bucy, MD.  The care coordination team was consulted for assistance with disease management and care coordination needs.    Engaged with patient by telephone for initial visit.  Recent office visits: 02/14/23: Patient presented to Dr. Brita Romp for follow-up. Brexpiprazole 0.5 mg. Shawna Clamp   01/14/23: Patient presented to Dr. Brita Romp for follow-up. Ferrous Sulfate, Losartan, Meloxicam.   07/17/2022 [name], Do (PCP Office Visit) for Weakness- Started: Ferrous Sulfate 325 mg, Lab orders placed, DG Knee complete 4 views order placed, Referral to Gastroenterology placed, Patient to follow-up in 1 week   07/04/2022 [name], DO (PCP Office Visit) for Hair loss- No medication changes noted, Lab orders placed, Referral to Psychiatry placed, BP 140/82,   Recent consult visits: 07/09/2022 [name], [name] PA-C (Wound Care) No medication changes noted, No orders placed,    Hospital visits: None in previous 6 months   Objective:  Lab Results  Component Value Date   CREATININE 0.91 02/14/2023   BUN 26 02/14/2023   GFR 113.78 12/13/2016   EGFR 68 02/14/2023   GFRNONAA >60 04/05/2022   GFRAA 72 12/21/2020   NA 139 02/14/2023   K 4.7 02/14/2023   CALCIUM 9.0 02/14/2023   CO2 21 02/14/2023   GLUCOSE 122 (H) 02/14/2023    Lab Results  Component Value Date/Time   HGBA1C 7.4 (A) 02/14/2023 11:07 AM   HGBA1C 7.4 (H) 03/03/2022 06:46 AM   HGBA1C 6.8 (H) 01/30/2022 11:32 AM   GFR 113.78 12/13/2016 10:56 AM   MICROALBUR 100 02/03/2020 11:28 AM   MICROALBUR 20  08/15/2017 11:35 AM    Last diabetic Eye exam:  Lab Results  Component Value Date/Time   HMDIABEYEEXA No Retinopathy 01/08/2023 12:00 AM   HMDIABEYEEXA No Retinopathy 01/08/2023 12:00 AM    Last diabetic Foot exam: No results found for: "HMDIABFOOTEX"   Lab Results  Component Value Date   CHOL 321 (H) 02/14/2023   HDL 48 02/14/2023   LDLCALC 223 (H) 02/14/2023   LDLDIRECT 183.0 12/13/2016   TRIG 247 (H) 02/14/2023   CHOLHDL 6.7 (H) 02/14/2023       Latest Ref Rng & Units 02/14/2023   11:30 AM 04/05/2022   11:57 AM 03/14/2022    3:50 AM  Hepatic Function  Total Protein 6.0 - 8.5 g/dL 7.3  7.2  5.5   Albumin 3.9 - 4.9 g/dL 4.0  2.9  2.1   AST 0 - 40 IU/L 20  11  11    ALT 0 - 32 IU/L 17  10  9    Alk Phosphatase 44 - 121 IU/L 137  64  52   Total Bilirubin 0.0 - 1.2 mg/dL <0.2  0.3  0.4     Lab Results  Component Value Date/Time   TSH 2.100 07/04/2022 02:46 PM   TSH 2.160 07/18/2018 09:06 AM   FREET4 0.93 12/13/2016 10:56 AM       Latest Ref Rng & Units 02/14/2023   11:30 AM 07/17/2022    3:32 PM 07/04/2022    2:46 PM  CBC  WBC 3.4 - 10.8 x10E3/uL 5.1  10.1  6.4  Hemoglobin 11.1 - 15.9 g/dL 11.0  9.9  9.9   Hematocrit 34.0 - 46.6 % 36.1  31.8  32.7   Platelets 150 - 450 x10E3/uL 276  457  352     Lab Results  Component Value Date/Time   VD25OH 33.0 02/03/2020 11:24 AM   VD25OH 27.9 (L) 07/15/2019 10:26 AM   VITAMINB12 429 07/17/2022 03:32 PM   VITAMINB12 234 03/04/2022 01:07 AM    Clinical ASCVD: Yes  The ASCVD Risk score (Arnett DK, et al., 2019) failed to calculate for the following reasons:   The valid total cholesterol range is 130 to 320 mg/dL       02/14/2023   11:08 AM 02/01/2023   12:31 PM 01/14/2023   10:44 AM  Depression screen PHQ 2/9  Decreased Interest 0 0 1  Down, Depressed, Hopeless 1 0 1  PHQ - 2 Score 1 0 2  Altered sleeping 0  1  Tired, decreased energy 0  1  Change in appetite 1  1  Feeling bad or failure about yourself  0  0  Trouble  concentrating 0  1  Moving slowly or fidgety/restless 0  0  Suicidal thoughts 0  0  PHQ-9 Score 2  6  Difficult doing work/chores Not difficult at all  Somewhat difficult     Social History   Tobacco Use  Smoking Status Former   Packs/day: 1.00   Years: 29.00   Additional pack years: 0.00   Total pack years: 29.00   Types: Cigarettes   Quit date: 12/03/1996   Years since quitting: 26.2   Passive exposure: Past  Smokeless Tobacco Never   BP Readings from Last 3 Encounters:  02/14/23 139/79  01/14/23 133/76  01/07/23 (!) 166/97   Pulse Readings from Last 3 Encounters:  02/14/23 83  01/14/23 84  01/07/23 (!) 103   Wt Readings from Last 3 Encounters:  02/14/23 260 lb (117.9 kg)  01/14/23 260 lb 11.2 oz (118.3 kg)  01/07/23 260 lb (117.9 kg)   BMI Readings from Last 3 Encounters:  02/14/23 40.72 kg/m  01/14/23 40.83 kg/m  01/07/23 40.72 kg/m    Allergies  Allergen Reactions   Sulfa Antibiotics Itching, Rash and Swelling   Latex Itching    Other reaction(s): angioedema   Doxycycline Rash   Empagliflozin Itching and Other (See Comments)    Itching and frequent yeast infections    Medications Reviewed Today     Reviewed by Virginia Crews, MD (Physician) on 02/14/23 at 1107  Med List Status: <None>   Medication Order Taking? Sig Documenting Provider Last Dose Status Informant  acetaminophen (TYLENOL) 500 MG tablet [id number] Yes Take 500 mg by mouth every 6 (six) hours as needed. [provider] Taking Active   acetaminophen (TYLENOL) 650 MG CR tablet [id number] Yes Take 650 mg by mouth every 8 (eight) hours as needed for pain. [provider] Taking Active   Biotin w/ Vitamins C & E (HAIR/SKIN/NAILS PO) [id number] Yes Take 1 tablet by mouth daily. [provider] Taking Active Self  buPROPion (WELLBUTRIN XL) 300 MG 24 hr tablet [id number] Yes TAKE ONE TABLET BY MOUTH DAILY Bacigalupo, Dionne Bucy, MD Taking Active   DULoxetine (CYMBALTA)  60 MG capsule [id number] Yes Take 1 capsule (60 mg total) by mouth daily. Tally Joe T, FNP Taking Active   GEMTESA 75 MG TABS [id number] Yes Take 1 tablet (75 mg total) by mouth daily. Bjorn Loser, MD Taking Active   ketoconazole (NIZORAL) 2 %  shampoo [id number] Yes Apply 1 Application topically 2 (two) times a week. Virginia Crews, MD Taking Active   losartan (COZAAR) 25 MG tablet [id number] No TAKE ONE TABLET BY MOUTH DAILY  Patient not taking: Reported on 02/14/2023   Virginia Crews, MD Not Taking Active   metFORMIN (GLUCOPHAGE) 500 MG tablet [id number] Yes TAKE ONE TABLET BY MOUTH TWICE DAILY Bacigalupo, Dionne Bucy, MD Taking Active   polyethylene glycol (MIRALAX / GLYCOLAX) 17 g packet [id number] Yes Take 17 g by mouth daily as needed for mild constipation. Bonnielee Haff, MD Taking Active Self  pregabalin (LYRICA) 75 MG capsule [id number] Yes TAKE ONE CAPSULE BY MOUTH THREE TIMES DAILY Bacigalupo, Dionne Bucy, MD Taking Active   tolterodine (DETROL LA) 4 MG 24 hr capsule [id number] Yes Take 1 capsule (4 mg total) by mouth daily. Bjorn Loser, MD Taking Active             SDOH:  (Social Determinants of Health) assessments and interventions performed: Yes SDOH Interventions    Flowsheet Row Office Visit from 02/14/2023 in [place] Telephone from 02/01/2023 in [place] Coordination from 01/29/2023 in [place] Video Visit from 05/31/2022 in [place] Office Visit from 04/19/2022 in [place] Office Visit from 07/18/2021 in [place]  SDOH Interventions        Food Insecurity Interventions -- Intervention Not Indicated Intervention Not Indicated -- -- --  Housing Interventions -- Intervention Not Indicated -- -- -- --  Depression Interventions/Treatment  PHQ2-9 Score <4 Follow-up Not Indicated -- --  Currently on Treatment Currently on Treatment Currently on Treatment  Financial Strain Interventions -- -- Other (Comment)  [LCSW Referral] -- -- --       Medication Assistance: None required.  Patient affirms current coverage meets needs.  Medication Access: Within the past 30 days, how often has patient missed a dose of medication? None Is a pillbox or other method used to improve adherence? Yes  - Adherence Packaging via [facility] Factors that may affect medication adherence? no barriers identified Are meds synced by current pharmacy? Yes  Are meds delivered by current pharmacy? Yes  Does patient experience delays in picking up medications due to transportation concerns? No   [facility] Services Reviewed: Is patient disadvantaged to use [facility]?: No  Current Rx insurance plan: [facility] Name and location of Current pharmacy:  [id number] [place] [address] Phone: [phone] Fax: [place], [place] - [address] [place] [address] Phone: [phone] Fax: [phone]  [facility] services reviewed with patient today?: No  Patient requests to transfer care to [facility]?: No  Reason patient declined to change pharmacies: Loyalty to other pharmacy/Patient preference  Compliance/Adherence/Medication fill history: Care Gaps: A1c DEXA  Mammogram   Star-Rating Drugs: Losartan 25 mg last filled on 09/21/2022 for a 30-Day supply with [facility] Metformin 500 mg last filled on 11/16/2022 for a 30-Day supply with [facility]   Assessment/Plan  Heart Failure (Goal: manage symptoms and prevent exacerbations) -Uncontrolled -Last ejection fraction: >50% (Date: Aug 2022) -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%) -NYHA Class: II (slight limitation of activity) -AHA HF Stage: B (Heart disease present  - no symptoms present) -Current treatment: Losartan 25 mg daily  -  Medications previously tried: Carvedilol  -Current home BP/HR readings:  -Reports hypotensive symptoms: Dizziness most mornings after taking her AM medications.  -Current home daily weights: NA -Recommended to continue current medication  Hyperlipidemia: (LDL goal < 55) -Uncontrolled -history of CAD s/p PCI 2010 -Current treatment: Ezetimibe 10 mg daily  -Medications previously tried: Rosuvastatin -Given history of CAD and intolerance to statins, patient would be good candidate for PCSK9 inhibitor. Both Repatha and Praluent appear to be T3 drugs on patient's formulary.  -Recheck FLP at PCP follow-up, then start Repatha 140 mg every 2 weeks.   Diabetes (A1c goal <7%) -Uncontrolled -Current medications: Metformin 500 mg twice daily  Mounjaro 2.5 mg weekly   -Medications previously tried: Jardiance (Yeast Infections), Trulicity    -Current home glucose readings: 88-111  -Denies hypoglycemic/hyperglycemic symptoms -Blood sugars much better controlled with Mounjaro. She denies nausea/vomiting since starting the medication.    Depression/Anxiety (Goal: Achieve symptom remission) -Uncontrolled -Current treatment: Brexpiprazole 0.5 mg daily  Bupropion XL 300 mg daily  Duloxetine 60 mg daily  -Medications previously tried/failed: NA -PHQ9: 6 -GAD7: 10 -Patient reports worsening depression/anxiety symptoms. Her primary stressor includes financial challenges with housing bills.  -Because Bupropion use results in CNS activation, it may be contributing to worsening symptoms of anxiety. Could consider alternative augmentation agent such as buspirone.  -Recommended to continue current medication  Junius Argyle, PharmD, [place], [place] Pharmacist Practitioner  [facility] [phone]

## 2023-03-04 ENCOUNTER — Encounter: Payer: Self-pay | Admitting: Pharmacist

## 2023-03-04 NOTE — Progress Notes (Signed)
[place] Team Statin Quality Measure Assessment   03/04/2023  [name] [birth year] [id number]  Per review of chart and payor information, [name] [name] has a diagnosis of diabetes but is not currently filling a statin prescription.  This places patient into the Statin Use In Patients with Diabetes (SUPD) measure for CMS.    Patient has documented trials of statins with reported myalgias, but no corresponding CPT codes that would exclude patient from SUPD measure. Last year, [name] [name] was coded M79.10 (myalgia due to statin), if this is still clinically appropriate, please consider adding this diagnosis coded to tomorrow's office visit to remover her from the measure for 2024.   Code for past statin intolerance or  other exclusions (required annually)  Provider Requirements: Associate code during an office visit or telehealth encounter  Drug Induced Myopathy G72.0   Myopathy, unspecified G72.9   Myositis, unspecified M60.9   Rhabdomyolysis M62.82   Cirrhosis of liver K74.69   Prediabetes R73.03   PCOS E28.2   Thank you for allowing [facility] pharmacy to be a part of this patient's care. Curlene Labrum, PharmD [place] Pharmacist Office: [phone]

## 2023-03-05 ENCOUNTER — Encounter: Payer: Self-pay | Admitting: Family Medicine

## 2023-03-05 ENCOUNTER — Ambulatory Visit (INDEPENDENT_AMBULATORY_CARE_PROVIDER_SITE_OTHER): Payer: 59 | Admitting: Family Medicine

## 2023-03-05 VITALS — BP 122/72 | HR 86 | Temp 97.6°F | Resp 16 | Wt 254.6 lb

## 2023-03-05 DIAGNOSIS — E118 Type 2 diabetes mellitus with unspecified complications: Secondary | ICD-10-CM

## 2023-03-05 DIAGNOSIS — M7022 Olecranon bursitis, left elbow: Secondary | ICD-10-CM | POA: Insufficient documentation

## 2023-03-05 DIAGNOSIS — M791 Myalgia, unspecified site: Secondary | ICD-10-CM | POA: Diagnosis not present

## 2023-03-05 DIAGNOSIS — T466X5A Adverse effect of antihyperlipidemic and antiarteriosclerotic drugs, initial encounter: Secondary | ICD-10-CM

## 2023-03-05 NOTE — Assessment & Plan Note (Signed)
New problem No signs of infection/septic bursa Encourage to use rest, compression, NSAIDs If not improving, will see Ortho

## 2023-03-05 NOTE — Assessment & Plan Note (Signed)
Improving with Mounjaro BMI 39 and assoc with HTN, HLD, DM Discussed importance of healthy weight management Discussed diet and exercise

## 2023-03-05 NOTE — Progress Notes (Signed)
I,Sulibeya S Dimas,acting as a Education administrator for Lavon Paganini, MD.,have documented all relevant documentation on the behalf of Lavon Paganini, MD,as directed by  Lavon Paganini, MD while in the presence of Lavon Paganini, MD.     Established patient visit   Patient: [name] [name]   DOB: 08-Jan-1951   72 y.o. Female  MRN: [id number] Visit Date: 03/05/2023  Today's healthcare provider: Lavon Paganini, MD   Chief Complaint  Patient presents with   Elbow Pain   Obesity   Subjective    HPI  Patient C/O left elbow pain x 1 week. She denies any swelling. She reports pain with laying her arm a certain way. She stabbing pain. She reports she is having shoulder surgery on May 1, 24. She is not sure if pain is coming from shoulder.   Follow up for obesity  The patient was last seen for this 4 weeks ago. Changes made at last visit include start mounjaro 2.5 mg.  She reports excellent compliance with treatment. She feels that condition is Improved. She is not having side effects.   Wt Readings from Last 3 Encounters:  03/05/23 254 lb 9.6 oz (115.5 kg)  02/14/23 260 lb (117.9 kg)  01/14/23 260 lb 11.2 oz (118.3 kg)     -----------------------------------------------------------------------------------------   Medications: Outpatient Medications Prior to Visit  Medication Sig   acetaminophen (TYLENOL) 650 MG CR tablet Take 650 mg by mouth every 8 (eight) hours as needed for pain.   Biotin w/ Vitamins C & E (HAIR/SKIN/NAILS PO) Take 1 tablet by mouth daily.   Brexpiprazole (REXULTI) 0.5 MG TABS Take 1 tablet (0.5 mg total) by mouth daily.   buPROPion (WELLBUTRIN XL) 300 MG 24 hr tablet TAKE ONE TABLET BY MOUTH DAILY   DULoxetine (CYMBALTA) 60 MG capsule TAKE ONE CAPSULE BY MOUTH DAILY   ezetimibe (ZETIA) 10 MG tablet Take 1 tablet (10 mg total) by mouth daily.   GEMTESA 75 MG TABS Take 1 tablet (75 mg total) by mouth daily.   ketoconazole (NIZORAL) 2 % shampoo Apply 1  Application topically 2 (two) times a week.   losartan (COZAAR) 25 MG tablet TAKE ONE TABLET BY MOUTH DAILY (Patient not taking: Reported on 02/14/2023)   meloxicam (MOBIC) 15 MG tablet Take 15 mg by mouth daily.   metFORMIN (GLUCOPHAGE) 500 MG tablet TAKE ONE TABLET BY MOUTH TWICE DAILY   polyethylene glycol (MIRALAX / GLYCOLAX) 17 g packet Take 17 g by mouth daily as needed for mild constipation.   pregabalin (LYRICA) 75 MG capsule TAKE ONE CAPSULE BY MOUTH THREE TIMES DAILY   tirzepatide (MOUNJARO) 2.5 MG/0.5ML Pen Inject 2.5 mg into the skin once a week for 28 days.   tirzepatide [facility]) 5 MG/0.5ML Pen Inject 5 mg into the skin once a week.   tolterodine (DETROL LA) 4 MG 24 hr capsule Take 1 capsule (4 mg total) by mouth daily.   No facility-administered medications prior to visit.    Review of Systems     Objective    BP 122/72 (BP Location: Left Arm, Patient Position: Sitting, Cuff Size: Large)   Pulse 86   Temp 97.6 F (36.4 C) (Temporal)   Resp 16   Wt 254 lb 9.6 oz (115.5 kg)   LMP  (LMP Unknown) Comment: age 89  SpO2 96%   BMI 39.88 kg/m    Physical Exam Vitals reviewed.  Constitutional:      General: She is not in acute distress.    Appearance: Normal  appearance. She is well-developed. She is not diaphoretic.  HENT:     Head: Normocephalic and atraumatic.  Eyes:     General: No scleral icterus.    Conjunctiva/sclera: Conjunctivae normal.  Neck:     Thyroid: No thyromegaly.  Cardiovascular:     Rate and Rhythm: Normal rate and regular rhythm.     Heart sounds: Normal heart sounds. No murmur heard. Pulmonary:     Effort: Pulmonary effort is normal. No respiratory distress.     Breath sounds: Normal breath sounds. No wheezing, rhonchi or rales.  Musculoskeletal:     Cervical back: Neck supple.     Right lower leg: No edema.     Left lower leg: No edema.     Comments: Swelling over olecranon of L elbow, no erythema  Lymphadenopathy:     Cervical: No  cervical adenopathy.  Skin:    General: Skin is warm and dry.  Neurological:     Mental Status: She is alert and oriented to person, place, and time. Mental status is at baseline.  Psychiatric:        Mood and Affect: Mood normal.        Behavior: Behavior normal.      No results found for any visits on 03/05/23.  Assessment & Plan     Problem List Items Addressed This Visit       Endocrine   Type 2 diabetes mellitus with complication, without long-term current use of insulin - Primary    Home CBGs improving on Mounjaro Increase to 5 mg weekly dose Recheck A2c at next visit        Musculoskeletal and Integument   Olecranon bursitis of left elbow    New problem No signs of infection/septic bursa Encourage to use rest, compression, NSAIDs If not improving, will see Ortho        Other   Morbid obesity    Improving with Mounjaro BMI 39 and assoc with HTN, HLD, DM Discussed importance of healthy weight management Discussed diet and exercise       Myalgia due to statin    Intolerant of statin Continue zetia        Return for as scheduled.      I, Lavon Paganini, MD, have reviewed all documentation for this visit. The documentation on 03/05/23 for the exam, diagnosis, procedures, and orders are all accurate and complete.   Teon Hudnall, Dionne Bucy, MD, MPH [facility]

## 2023-03-05 NOTE — Assessment & Plan Note (Signed)
Intolerant of statin Continue zetia

## 2023-03-05 NOTE — Assessment & Plan Note (Signed)
Home CBGs improving on Mounjaro Increase to 5 mg weekly dose Recheck A2c at next visit

## 2023-03-11 ENCOUNTER — Telehealth: Payer: Self-pay | Admitting: Family Medicine

## 2023-03-11 NOTE — Telephone Encounter (Signed)
Contacted [name] [name] to schedule their annual wellness visit. Appointment made for 05/29/2023.  [facility] Care Guide [facility] AWV TEAM Direct Dial: [phone]

## 2023-03-13 ENCOUNTER — Other Ambulatory Visit: Payer: Self-pay | Admitting: Family Medicine

## 2023-03-15 ENCOUNTER — Other Ambulatory Visit: Payer: Self-pay | Admitting: Family Medicine

## 2023-03-20 ENCOUNTER — Ambulatory Visit: Payer: Self-pay

## 2023-03-20 ENCOUNTER — Encounter: Payer: Self-pay | Admitting: Family Medicine

## 2023-03-20 DIAGNOSIS — E113392 Type 2 diabetes mellitus with moderate nonproliferative diabetic retinopathy without macular edema, left eye: Secondary | ICD-10-CM | POA: Diagnosis not present

## 2023-03-20 DIAGNOSIS — E113391 Type 2 diabetes mellitus with moderate nonproliferative diabetic retinopathy without macular edema, right eye: Secondary | ICD-10-CM | POA: Diagnosis not present

## 2023-03-20 DIAGNOSIS — H2513 Age-related nuclear cataract, bilateral: Secondary | ICD-10-CM | POA: Diagnosis not present

## 2023-03-20 DIAGNOSIS — H04123 Dry eye syndrome of bilateral lacrimal glands: Secondary | ICD-10-CM | POA: Diagnosis not present

## 2023-03-20 LAB — HM DIABETES EYE EXAM

## 2023-03-20 NOTE — Telephone Encounter (Signed)
      Chief Complaint: Left elbow pain, swelling, redness. Has appointment tomorrow, but asking to be worked in today. Symptoms: Above Frequency: Worse today Pertinent Negatives: Patient denies fever Disposition: [] ED /[] Urgent Care (no appt availability in office) / [] Appointment(In office/virtual)/ []  [facility]/ [] Home Care/ [] Refused Recommended Disposition /[] [place] [facility]/ [x]  Follow-up with PCP Additional Notes: Please advise pt.  Reason for Disposition  [1] Looks infected (spreading redness, pus) AND [2] large red area (> 2 in. or 5 cm)  Answer Assessment - Initial Assessment Questions 1. ONSET: "When did the pain start?"     1 month ago 2. LOCATION: "Where is the pain located?"     Left elbow 3. PAIN: "How bad is the pain?" (Scale 1-10; or mild, moderate, severe)   - MILD (1-3): doesn't interfere with normal activities.   - MODERATE (4-7): interferes with normal activities (e.g., work or school) or awakens from sleep.   - SEVERE (8-10): excruciating pain, unable to do any normal activities, unable to use arm at all.     Severe 4. WORK OR EXERCISE: "Has there been any recent work or exercise that involved this part of the body?"     No 5. CAUSE: "What do you think is causing the elbow pain?"     Unsure 6. OTHER SYMPTOMS: "Do you have any other symptoms?" (e.g., neck pain, elbow swelling, rash, fever)     Swollen, red, hot 7. PREGNANCY: "Is there any chance you are pregnant?" "When was your last menstrual period?"     No  Protocols used: Elbow Pain-A-AH

## 2023-03-21 ENCOUNTER — Ambulatory Visit (INDEPENDENT_AMBULATORY_CARE_PROVIDER_SITE_OTHER): Payer: 59 | Admitting: Family Medicine

## 2023-03-21 ENCOUNTER — Encounter: Payer: Self-pay | Admitting: Family Medicine

## 2023-03-21 VITALS — BP 149/78 | HR 82 | Temp 98.7°F | Wt 257.5 lb

## 2023-03-21 DIAGNOSIS — M7022 Olecranon bursitis, left elbow: Secondary | ICD-10-CM | POA: Diagnosis not present

## 2023-03-21 DIAGNOSIS — L03114 Cellulitis of left upper limb: Secondary | ICD-10-CM

## 2023-03-21 MED ORDER — CEPHALEXIN 500 MG PO CAPS: 500.0000 mg | ORAL_CAPSULE | Freq: Three times a day (TID) | ORAL | 0 refills | Status: AC

## 2023-03-21 NOTE — Progress Notes (Signed)
Acute Office Visit  Subjective:     Patient ID: [name] [name], female    DOB: 08/25/1951, 72 y.o.   MRN: [id number]  Chief Complaint  Patient presents with   Elbow Pain    HPI Patient is in today for a 1 day history of worsening swelling of her left elbow. She had olecranon bursitis diagnosed at her last visit one month ago.   Yesterday she noticed the tip of her elbow turn bright red and begin to swell.   Today she noticed that the swelling had spread to her left armpit and breast. She noted that it feels very hot to the touch.    Review of Systems  Constitutional:  Negative for chills and fever.  Respiratory:  Negative for cough and shortness of breath.   Cardiovascular:  Negative for chest pain.  Musculoskeletal:  Positive for joint pain.  Neurological:  Negative for dizziness, loss of consciousness and headaches.      Objective:    BP (!) 149/78 (BP Location: Right Wrist, Patient Position: Sitting, Cuff Size: Normal)   Pulse 82   Wt 257 lb 8 oz (116.8 kg)   LMP  (LMP Unknown) Comment: age 63  SpO2 98%   BMI 40.33 kg/m    Physical Exam Constitutional:      Appearance: She is not toxic-appearing or diaphoretic.  Cardiovascular:     Pulses: Normal pulses.     Heart sounds: Normal heart sounds.  Pulmonary:     Effort: Pulmonary effort is normal.     Breath sounds: Normal breath sounds.  Musculoskeletal:     Right elbow: Normal.     Left elbow: Swelling and effusion present. Decreased range of motion. Tenderness present.     Left wrist: Normal.  Lymphadenopathy:     Upper Body:     Left upper body: No supraclavicular or axillary adenopathy.  Skin:    General: Skin is warm.     Capillary Refill: Capillary refill takes less than 2 seconds.     Findings: Erythema present.     Nails: There is no clubbing.     Comments: Left upper arm: swollen, erythematous without well defined borders, tender and warm on palpation.    Neurological:     Mental Status: She is  alert.     No results found for any visits on 03/21/23.      Assessment & Plan:  1. Cellulitis of left upper extremity 2. Olecranon bursitis of left elbow History and physical exam consistent with olecranon bursitis with overlying cellulitis.  No systemic symptoms at this visit concerning for septic bursitis.  Start cephalexin TID for 7 days, due to doxycycline and sulfa drug allergy.  Referral to orthopedics for further evaluation and management.  - Ambulatory referral to Orthopedics   Meds ordered this encounter  Medications   cephALEXin (KEFLEX) 500 MG capsule    Sig: Take 1 capsule (500 mg total) by mouth 3 (three) times daily for 7 days.    Dispense:  21 capsule    Refill:  0    Return if symptoms worsen or fail to improve.  Ezekiel Slocumb, MS3 [facility]    Patient seen along with MS3 student Ezekiel Slocumb. I personally evaluated this patient along with the student, and verified all aspects of the history, physical exam, and medical decision making as documented by the student. I agree with the student's documentation and have made all necessary edits.  Shirlee Latch  M, MD, MPH [facility]

## 2023-03-21 NOTE — Telephone Encounter (Signed)
Noted  

## 2023-03-22 DIAGNOSIS — M71129 Other infective bursitis, unspecified elbow: Secondary | ICD-10-CM | POA: Insufficient documentation

## 2023-03-22 DIAGNOSIS — M7022 Olecranon bursitis, left elbow: Secondary | ICD-10-CM | POA: Diagnosis not present

## 2023-03-26 ENCOUNTER — Ambulatory Visit: Payer: 59

## 2023-03-26 DIAGNOSIS — E118 Type 2 diabetes mellitus with unspecified complications: Secondary | ICD-10-CM

## 2023-03-26 DIAGNOSIS — I2510 Atherosclerotic heart disease of native coronary artery without angina pectoris: Secondary | ICD-10-CM

## 2023-03-26 DIAGNOSIS — M791 Myalgia, unspecified site: Secondary | ICD-10-CM

## 2023-03-26 NOTE — Progress Notes (Signed)
Care Management & Coordination Services Pharmacy Note  03/26/2023 Name:  [name] [name] MRN:  [id number] DOB:  [birth year]  Summary: Patient presents for follow-up pharmacy consult.   -Blood sugars much better controlled with Mounjaro. She denies nausea/vomiting since starting the medication. She will have to stop the medication for ~4 weeks for her shoulder surgery.   -Given history of CAD and intolerance to statins, patient would be good candidate for PCSK9 inhibitor. Defer until after upcoming surgery.  Recommendations/Changes made from today's visit: Continue current medications  Follow up plan:  -CPP follow-up 3 months  Subjective: [name] [name] is an 72 y.o. year old female who is a primary patient of Bacigalupo, Marzella Schlein, MD.  The care coordination team was consulted for assistance with disease management and care coordination needs.    Engaged with patient by telephone for initial visit.  Recent office visits: 02/14/23: Patient presented to Dr. Beryle Flock for follow-up. Brexpiprazole 0.5 mg. Burnett Harry   01/14/23: Patient presented to Dr. Beryle Flock for follow-up. Ferrous Sulfate, Losartan, Meloxicam.   07/17/2022 Ellwood Dense, Do (PCP Office Visit) for Weakness- Started: Ferrous Sulfate 325 mg, Lab orders placed, DG Knee complete 4 views order placed, Referral to Gastroenterology placed, Patient to follow-up in 1 week   07/04/2022 Ellwood Dense, DO (PCP Office Visit) for Hair loss- No medication changes noted, Lab orders placed, Referral to Psychiatry placed, BP 140/82,   Recent consult visits: 07/09/2022 Dalbert Mayotte, [name] PA-C (Wound Care) No medication changes noted, No orders placed,    Hospital visits: None in previous 6 months   Objective:  Lab Results  Component Value Date   CREATININE 0.91 02/14/2023   BUN 26 02/14/2023   GFR 113.78 12/13/2016   EGFR 68 02/14/2023   GFRNONAA >60 04/05/2022   GFRAA 72 12/21/2020   NA 139 02/14/2023   K 4.7 02/14/2023    CALCIUM 9.0 02/14/2023   CO2 21 02/14/2023   GLUCOSE 122 (H) 02/14/2023    Lab Results  Component Value Date/Time   HGBA1C 7.4 (A) 02/14/2023 11:07 AM   HGBA1C 7.4 (H) 03/03/2022 06:46 AM   HGBA1C 6.8 (H) 01/30/2022 11:32 AM   GFR 113.78 12/13/2016 10:56 AM   MICROALBUR 100 02/03/2020 11:28 AM   MICROALBUR 20 08/15/2017 11:35 AM    Last diabetic Eye exam:  Lab Results  Component Value Date/Time   HMDIABEYEEXA Retinopathy (A) 03/20/2023 12:00 AM    Last diabetic Foot exam: No results found for: "HMDIABFOOTEX"   Lab Results  Component Value Date   CHOL 321 (H) 02/14/2023   HDL 48 02/14/2023   LDLCALC 223 (H) 02/14/2023   LDLDIRECT 183.0 12/13/2016   TRIG 247 (H) 02/14/2023   CHOLHDL 6.7 (H) 02/14/2023       Latest Ref Rng & Units 02/14/2023   11:30 AM 04/05/2022   11:57 AM 03/14/2022    3:50 AM  Hepatic Function  Total Protein 6.0 - 8.5 g/dL 7.3  7.2  5.5   Albumin 3.9 - 4.9 g/dL 4.0  2.9  2.1   AST 0 - 40 IU/L 20  11  11    ALT 0 - 32 IU/L 17  10  9    Alk Phosphatase 44 - 121 IU/L 137  64  52   Total Bilirubin 0.0 - 1.2 mg/dL <4.0  0.3  0.4     Lab Results  Component Value Date/Time   TSH 2.100 07/04/2022 02:46 PM   TSH 2.160 07/18/2018 09:06 AM   FREET4 0.93 12/13/2016 10:56  AM       Latest Ref Rng & Units 02/14/2023   11:30 AM 07/17/2022    3:32 PM 07/04/2022    2:46 PM  CBC  WBC 3.4 - 10.8 x10E3/uL 5.1  10.1  6.4   Hemoglobin 11.1 - 15.9 g/dL 16.1  9.9  9.9   Hematocrit 34.0 - 46.6 % 36.1  31.8  32.7   Platelets 150 - 450 x10E3/uL 276  457  352     Lab Results  Component Value Date/Time   VD25OH 33.0 02/03/2020 11:24 AM   VD25OH 27.9 (L) 07/15/2019 10:26 AM   VITAMINB12 429 07/17/2022 03:32 PM   VITAMINB12 234 03/04/2022 01:07 AM    Clinical ASCVD: Yes  The ASCVD Risk score (Arnett DK, et al., 2019) failed to calculate for the following reasons:   The valid total cholesterol range is 130 to 320 mg/dL       0/96/0454    0:98 PM 02/14/2023    11:08 AM 02/01/2023   12:31 PM  Depression screen PHQ 2/9  Decreased Interest 0 0 0  Down, Depressed, Hopeless 0 1 0  PHQ - 2 Score 0 1 0  Altered sleeping 0 0   Tired, decreased energy 0 0   Change in appetite 0 1   Feeling bad or failure about yourself  0 0   Trouble concentrating 0 0   Moving slowly or fidgety/restless 0 0   Suicidal thoughts 0 0   PHQ-9 Score 0 2   Difficult doing work/chores Not difficult at all Not difficult at all      Social History   Tobacco Use  Smoking Status Former   Packs/day: 1.00   Years: 29.00   Additional pack years: 0.00   Total pack years: 29.00   Types: Cigarettes   Quit date: 12/03/1996   Years since quitting: 26.3   Passive exposure: Past  Smokeless Tobacco Never   BP Readings from Last 3 Encounters:  03/21/23 (!) 149/78  03/05/23 122/72  02/14/23 139/79   Pulse Readings from Last 3 Encounters:  03/21/23 82  03/05/23 86  02/14/23 83   Wt Readings from Last 3 Encounters:  03/21/23 257 lb 8 oz (116.8 kg)  03/05/23 254 lb 9.6 oz (115.5 kg)  02/14/23 260 lb (117.9 kg)   BMI Readings from Last 3 Encounters:  03/21/23 40.33 kg/m  03/05/23 39.88 kg/m  02/14/23 40.72 kg/m    Allergies  Allergen Reactions   Sulfa Antibiotics Itching, Rash and Swelling   Latex Itching    Other reaction(s): angioedema   Doxycycline Rash   Empagliflozin Itching and Other (See Comments)    Itching and frequent yeast infections    Medications Reviewed Today     Reviewed by Erasmo Downer, MD (Physician) on 03/21/23 at 1524  Med List Status: <None>   Medication Order Taking? Sig Documenting Provider Last Dose Status Informant  acetaminophen (TYLENOL) 650 MG CR tablet [id number] No Take 650 mg by mouth every 8 (eight) hours as needed for pain. [provider] Taking Active   Biotin w/ Vitamins C & E (HAIR/SKIN/NAILS PO) [id number] No Take 1 tablet by mouth daily. [provider] Taking Active Self  Brexpiprazole (REXULTI)  0.5 MG TABS [id number]  Take 1 tablet (0.5 mg total) by mouth daily. Erasmo Downer, MD  Active   buPROPion (WELLBUTRIN XL) 300 MG 24 hr tablet [id number] No TAKE ONE TABLET BY MOUTH DAILY Bacigalupo, Marzella Schlein, MD Taking Active   cephALEXin [facility]  500 MG capsule [id number] Yes Take 1 capsule (500 mg total) by mouth 3 (three) times daily for 7 days. Erasmo Downer, MD  Active   DULoxetine (CYMBALTA) 60 MG capsule [id number]  TAKE ONE CAPSULE BY MOUTH DAILY Bacigalupo, Marzella Schlein, MD  Active   ezetimibe (ZETIA) 10 MG tablet [id number]  Take 1 tablet (10 mg total) by mouth daily. Erasmo Downer, MD  Active   GEMTESA 75 MG TABS [id number] No Take 1 tablet (75 mg total) by mouth daily. Alfredo Martinez, MD Taking Active   ketoconazole (NIZORAL) 2 % shampoo [id number]  APPLY 1 APPLICATION TOPICALLY 2 (TWO) TIMES A WEEK. Erasmo Downer, MD  Active   losartan (COZAAR) 25 MG tablet [id number] No TAKE ONE TABLET BY MOUTH DAILY  Patient not taking: Reported on 02/14/2023   Erasmo Downer, MD Not Taking Active   meloxicam (MOBIC) 15 MG tablet [id number]  Take 15 mg by mouth daily. [provider]  Active   metFORMIN (GLUCOPHAGE) 500 MG tablet [id number]  TAKE ONE TABLET BY MOUTH TWICE DAILY Bacigalupo, Marzella Schlein, MD  Active   polyethylene glycol (MIRALAX / GLYCOLAX) 17 g packet [id number] No Take 17 g by mouth daily as needed for mild constipation. Osvaldo Shipper, MD Taking Active Self  pregabalin (LYRICA) 75 MG capsule [id number] No TAKE ONE CAPSULE BY MOUTH THREE TIMES DAILY Beryle Flock Marzella Schlein, MD Taking Active   tirzepatide [facility]) 5 MG/0.5ML Pen [id number]  Inject 5 mg into the skin once a week. Erasmo Downer, MD  Active   tolterodine (DETROL LA) 4 MG 24 hr capsule [id number] No Take 1 capsule (4 mg total) by mouth daily. Alfredo Martinez, MD Taking Active             SDOH:  (Social Determinants of Health) assessments and interventions performed: Yes SDOH  Interventions    Flowsheet Row Office Visit from 02/14/2023 in [facility] Telephone from 02/01/2023 in [facility] Coordination Care Coordination from 01/29/2023 in [facility] Video Visit from 05/31/2022 in [facility] Office Visit from 04/19/2022 in [facility] Office Visit from 07/18/2021 in [facility]  SDOH Interventions        Food Insecurity Interventions -- Intervention Not Indicated Intervention Not Indicated -- -- --  Housing Interventions -- Intervention Not Indicated -- -- -- --  Depression Interventions/Treatment  PHQ2-9 Score <4 Follow-up Not Indicated -- -- Currently on Treatment Currently on Treatment Currently on Treatment  Financial Strain Interventions -- -- Other (Comment)  [LCSW Referral] -- -- --       Medication Assistance: None required.  Patient affirms current coverage meets needs.  Medication Access: Within the past 30 days, how often has patient missed a dose of medication? None Is a pillbox or other method used to improve adherence? Yes  - Adherence Packaging via [facility] Factors that may affect medication adherence? no barriers identified Are meds synced by current pharmacy? Yes  Are meds delivered by current pharmacy? Yes  Does patient experience delays in picking up medications due to transportation concerns? No   [facility] Services Reviewed: Is patient disadvantaged to use [facility]?: No  Current Rx insurance plan: [facility] Name and location of Current pharmacy:  [id number] [place], [place] - [address] [address] Phone: [phone] Fax: [phone]  [facility] - [facility] [place], [place] - [address]  [address] [place] [address] Phone: [phone] Fax: [phone]  [facility] services reviewed with patient today?:  No  Patient requests to transfer care to [facility] Pharm[facility]?: No  Reason patient declined to change pharmacies: Loyalty to other pharmacy/Patient preference  Compliance/Adherence/Medication fill history: Care Gaps: A1c DEXA  Mammogram   Star-Rating Drugs: Losartan 25 mg last filled on 09/21/2022 for a 30-Day supply with [facility] Metformin 500 mg last filled on 11/16/2022 for a 30-Day supply with [facility]   Assessment/Plan  Heart Failure (Goal: manage symptoms and prevent exacerbations) -Uncontrolled -Last ejection fraction: >50% (Date: Aug 2022) -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%) -NYHA Class: II (slight limitation of activity) -AHA HF Stage: B (Heart disease present - no symptoms present) -Current treatment: Losartan 25 mg daily  -Medications previously tried: Carvedilol  -Current home BP/HR readings:  -Reports hypotensive symptoms: Dizziness most mornings after taking her AM medications.  -Current home daily weights: NA -Recommended to continue current medication  Hyperlipidemia: (LDL goal < 55) -Uncontrolled -history of CAD s/p PCI 2010 -Current treatment: Ezetimibe 10 mg daily  -Medications previously tried: Rosuvastatin -Given history of CAD and intolerance to statins, patient would be good candidate for PCSK9 inhibitor. Defer until after upcoming surgery.  Diabetes (A1c goal <7%) -Uncontrolled -Current medications: Metformin 500 mg twice daily  Mounjaro 5 mg weekly   -Medications previously tried: Jardiance (Yeast Infections), Trulicity    -Current home glucose readings: 111, 98, 88  -Denies hypoglycemic/hyperglycemic symptoms -Blood sugars much better controlled with Mounjaro. She denies nausea/vomiting since starting the medication. She will have to stop the medication for ~4 weeks for her shoulder surgery.   Depression/Anxiety (Goal: Achieve symptom remission) -Controlled -Current treatment: Brexpiprazole 0.5 mg daily   Bupropion XL 300 mg daily  Duloxetine 60 mg daily  -Medications previously tried/failed: NA -PHQ9: 6 -GAD7: 10 -Recommended to continue current medication  Follow Up Plan: Telephone follow up appointment with care management team member scheduled for: 06/25/2023 at 11:00 AM   Angelena Sole, PharmD, BCACP, CPP  Clinical Pharmacist Practitioner  [facility] [phone]

## 2023-03-29 DIAGNOSIS — M7030 Other bursitis of elbow, unspecified elbow: Secondary | ICD-10-CM | POA: Insufficient documentation

## 2023-04-03 DIAGNOSIS — G8918 Other acute postprocedural pain: Secondary | ICD-10-CM | POA: Diagnosis not present

## 2023-04-03 DIAGNOSIS — Z96652 Presence of left artificial knee joint: Secondary | ICD-10-CM | POA: Diagnosis not present

## 2023-04-03 DIAGNOSIS — Z91040 Latex allergy status: Secondary | ICD-10-CM | POA: Diagnosis not present

## 2023-04-03 DIAGNOSIS — Z882 Allergy status to sulfonamides status: Secondary | ICD-10-CM | POA: Diagnosis not present

## 2023-04-03 DIAGNOSIS — Z8249 Family history of ischemic heart disease and other diseases of the circulatory system: Secondary | ICD-10-CM | POA: Diagnosis not present

## 2023-04-03 DIAGNOSIS — Z888 Allergy status to other drugs, medicaments and biological substances status: Secondary | ICD-10-CM | POA: Diagnosis not present

## 2023-04-03 DIAGNOSIS — Z881 Allergy status to other antibiotic agents status: Secondary | ICD-10-CM | POA: Diagnosis not present

## 2023-04-03 DIAGNOSIS — Z87891 Personal history of nicotine dependence: Secondary | ICD-10-CM | POA: Diagnosis not present

## 2023-04-03 DIAGNOSIS — Z955 Presence of coronary angioplasty implant and graft: Secondary | ICD-10-CM | POA: Diagnosis not present

## 2023-04-03 DIAGNOSIS — M19012 Primary osteoarthritis, left shoulder: Secondary | ICD-10-CM | POA: Diagnosis not present

## 2023-04-03 DIAGNOSIS — Z9889 Other specified postprocedural states: Secondary | ICD-10-CM | POA: Diagnosis not present

## 2023-04-03 DIAGNOSIS — M069 Rheumatoid arthritis, unspecified: Secondary | ICD-10-CM | POA: Diagnosis not present

## 2023-04-03 DIAGNOSIS — E119 Type 2 diabetes mellitus without complications: Secondary | ICD-10-CM | POA: Diagnosis not present

## 2023-04-03 DIAGNOSIS — F32A Depression, unspecified: Secondary | ICD-10-CM | POA: Diagnosis not present

## 2023-04-03 DIAGNOSIS — I2510 Atherosclerotic heart disease of native coronary artery without angina pectoris: Secondary | ICD-10-CM | POA: Diagnosis not present

## 2023-04-09 ENCOUNTER — Other Ambulatory Visit: Payer: Self-pay | Admitting: Family Medicine

## 2023-04-09 NOTE — Telephone Encounter (Signed)
Requested medication (s) are due for refill today: Yes  Requested medication (s) are on the active medication list: Yes  Last refill:  01/07/23  Future visit scheduled: Yes  Notes to clinic:  See request.    Requested Prescriptions  Pending Prescriptions Disp Refills   pregabalin (LYRICA) 75 MG capsule [Pharmacy Med Name: pregabalin 75 mg capsule] 90 capsule 2    Sig: TAKE ONE CAPSULE BY MOUTH THREE TIMES DAILY     Not Delegated - Neurology:  Anticonvulsants - Controlled - pregabalin Failed - 04/09/2023 12:25 PM      Failed - This refill cannot be delegated      Passed - Cr in normal range and within 360 days    Creatinine  Date Value Ref Range Status  08/31/2014 0.63 0.60 - 1.30 mg/dL Final   Creatinine, Ser  Date Value Ref Range Status  02/14/2023 0.91 0.57 - 1.00 mg/dL Final         Passed - Completed PHQ-2 or PHQ-9 in the last 360 days      Passed - Valid encounter within last 12 months    Recent Outpatient Visits           2 weeks ago Cellulitis of left upper extremity   [facility] [place], Marzella Schlein, MD   1 month ago Type 2 diabetes mellitus with complication, without long-term current use of insulin [facility])   [place] [facility] [place], Marzella Schlein, MD   1 month ago Hypertension associated with diabetes [facility])    [facility] [place], Marzella Schlein, MD   2 months ago Eczema, unspecified type   [facility] [place], Marzella Schlein, MD   8 months ago Anemia, unspecified type   [facility] Caro Laroche, DO       Future Appointments             In 1 month Bacigalupo, Marzella Schlein, MD [facility]

## 2023-04-12 DIAGNOSIS — M25512 Pain in left shoulder: Secondary | ICD-10-CM | POA: Diagnosis not present

## 2023-04-12 DIAGNOSIS — M25612 Stiffness of left shoulder, not elsewhere classified: Secondary | ICD-10-CM | POA: Diagnosis not present

## 2023-04-16 DIAGNOSIS — Z96612 Presence of left artificial shoulder joint: Secondary | ICD-10-CM | POA: Insufficient documentation

## 2023-04-17 ENCOUNTER — Other Ambulatory Visit: Payer: Self-pay | Admitting: Family Medicine

## 2023-05-02 NOTE — Progress Notes (Signed)
[facility] Quality Team Note  Name: [name] [name] Date of Birth: 06/09/1951 MRN: [id number] Date: 05/02/2023  [facility] Quality Team has reviewed this patient's chart, please see recommendations below:  [facility] Quality Other; (PATIENT DUE FOR BREAST CANCER SCREENING AND COLON CANCER SCREENING, PLEASE ADDRESS AT NEXT OFFICE VISIT WITH PCP)

## 2023-05-04 HISTORY — PX: OTHER SURGICAL HISTORY: SHX169

## 2023-05-08 DIAGNOSIS — M25612 Stiffness of left shoulder, not elsewhere classified: Secondary | ICD-10-CM | POA: Insufficient documentation

## 2023-05-08 DIAGNOSIS — M25512 Pain in left shoulder: Secondary | ICD-10-CM | POA: Diagnosis not present

## 2023-05-09 NOTE — Progress Notes (Deleted)
    I,Tyah Acord,acting as a Neurosurgeon for [employer], PA-C.,have documented all relevant documentation on the behalf of Alfredia Ferguson, PA-C,as directed by  Alfredia Ferguson, PA-C while in the presence of Alfredia Ferguson, PA-C.    Established patient visit   Patient: [name] [name]   DOB: May 23, 1951   72 y.o. Female  MRN: [id number] Visit Date: 05/10/2023  Today's healthcare provider: Alfredia Ferguson, PA-C   No chief complaint on file.  Subjective    HPI  ***  Medications: Outpatient Medications Prior to Visit  Medication Sig   acetaminophen (TYLENOL) 650 MG CR tablet Take 650 mg by mouth every 8 (eight) hours as needed for pain.   Biotin w/ Vitamins C & E (HAIR/SKIN/NAILS PO) Take 1 tablet by mouth daily.   Brexpiprazole (REXULTI) 0.5 MG TABS Take 1 tablet (0.5 mg total) by mouth daily.   buPROPion (WELLBUTRIN XL) 300 MG 24 hr tablet TAKE ONE TABLET BY MOUTH DAILY   DULoxetine (CYMBALTA) 60 MG capsule TAKE ONE CAPSULE BY MOUTH DAILY   ezetimibe (ZETIA) 10 MG tablet Take 1 tablet (10 mg total) by mouth daily.   GEMTESA 75 MG TABS Take 1 tablet (75 mg total) by mouth daily.   ketoconazole (NIZORAL) 2 % shampoo APPLY 1 APPLICATION TOPICALLY 2 (TWO) TIMES A WEEK.   losartan (COZAAR) 25 MG tablet TAKE ONE TABLET BY MOUTH DAILY (Patient not taking: Reported on 02/14/2023)   meloxicam (MOBIC) 15 MG tablet Take 15 mg by mouth daily.   metFORMIN (GLUCOPHAGE) 500 MG tablet TAKE ONE TABLET BY MOUTH TWICE DAILY   polyethylene glycol (MIRALAX / GLYCOLAX) 17 g packet Take 17 g by mouth daily as needed for mild constipation.   pregabalin (LYRICA) 75 MG capsule TAKE ONE CAPSULE BY MOUTH THREE TIMES DAILY   tirzepatide (MOUNJARO) 5 MG/0.5ML Pen Inject 5 mg into the skin once a week.   tolterodine (DETROL LA) 4 MG 24 hr capsule Take 1 capsule (4 mg total) by mouth daily.   No facility-administered medications prior to visit.    Review of Systems  {Labs  Heme  Chem  Endocrine  Serology   Results Review (optional):23779}   Objective    LMP  (LMP Unknown) Comment: age 53 {Show previous Citlalli Weikel signs (optional):23777}  Physical Exam  ***  No results found for any visits on 05/10/23.  Assessment & Plan     ***  No follow-ups on file.      {provider attestation***:1}   Alfredia Ferguson, PA-C  [facility] [phone] (phone) [phone] (fax)  [facility]

## 2023-05-10 ENCOUNTER — Ambulatory Visit: Payer: 59 | Admitting: Physician Assistant

## 2023-05-13 ENCOUNTER — Telehealth: Payer: Self-pay

## 2023-05-13 NOTE — Telephone Encounter (Signed)
Just an FYI   Copied from CRM [id number]. Topic: Appointment Scheduling - Scheduling Inquiry for Clinic >> May 13, 2023  9:11 AM Marlow Baars wrote: .Reason for CRM: The patient called in stating she had a bad fall last Thursday and was unable to call about her appt on Friday as she did not have her phone with her. She fell down 3 steps and wasn't found until Friday evening. She just wanted her provider to know. She was evaluated by an EMT after being found by her neighbor. She is following up with her orthopedic at [facility] Odis Luster about her elbow.

## 2023-05-15 ENCOUNTER — Other Ambulatory Visit: Payer: Self-pay | Admitting: Family Medicine

## 2023-05-15 NOTE — Telephone Encounter (Signed)
Medication Refill - Medication: pregabalin (LYRICA) 75 MG capsule   Has the patient contacted their pharmacy? Pharmacy called directly in (Agent: If no, request that the patient contact the pharmacy for the refill. If patient does not wish to contact the pharmacy document the reason why and proceed with request.) (Agent: If yes, when and what did the pharmacy advise?)pharmacy called in directly  Preferred Pharmacy (with phone number or street name):  [facility] - [address] [place], [place] - [address] Phone: [phone]  Fax: [phone]     Has the patient been seen for an appointment in the last year OR does the patient have an upcoming appointment? yes  Agent: Please be advised that RX refills may take up to 3 business days. We ask that you follow-up with your pharmacy.

## 2023-05-16 DIAGNOSIS — M25612 Stiffness of left shoulder, not elsewhere classified: Secondary | ICD-10-CM | POA: Diagnosis not present

## 2023-05-16 DIAGNOSIS — M25512 Pain in left shoulder: Secondary | ICD-10-CM | POA: Diagnosis not present

## 2023-05-16 MED ORDER — PREGABALIN 75 MG PO CAPS: 75.0000 mg | ORAL_CAPSULE | Freq: Three times a day (TID) | ORAL | 2 refills | Status: DC

## 2023-05-16 NOTE — Telephone Encounter (Signed)
Requested medication (s) are due for refill today: na  Requested medication (s) are on the active medication list: yes  Last refill:  04/11/23 #90 2 refills  Future visit scheduled: yes in 2 weeks  Notes to clinic:  not delegated per protocol. Do you want to refill rx?     Requested Prescriptions  Pending Prescriptions Disp Refills   pregabalin (LYRICA) 75 MG capsule 90 capsule 2    Sig: Take 1 capsule (75 mg total) by mouth 3 (three) times daily.     Not Delegated - Neurology:  Anticonvulsants - Controlled - pregabalin Failed - 05/15/2023  5:18 PM      Failed - This refill cannot be delegated      Passed - Cr in normal range and within 360 days    Creatinine  Date Value Ref Range Status  08/31/2014 0.63 0.60 - 1.30 mg/dL Final   Creatinine, Ser  Date Value Ref Range Status  02/14/2023 0.91 0.57 - 1.00 mg/dL Final         Passed - Completed PHQ-2 or PHQ-9 in the last 360 days      Passed - Valid encounter within last 12 months    Recent Outpatient Visits           1 month ago Cellulitis of left upper extremity   [place] [facility] [place], Marzella Schlein, MD   2 months ago Type 2 diabetes mellitus with complication, without long-term current use of insulin [facility])   [place] [facility] [place], Marzella Schlein, MD   3 months ago Hypertension associated with diabetes [facility])   [place] [facility] [place], Marzella Schlein, MD   4 months ago Eczema, unspecified type   [facility] [place], Marzella Schlein, MD   10 months ago Anemia, unspecified type   [facility] Caro Laroche, DO       Future Appointments             In 2 weeks Bacigalupo, Marzella Schlein, MD [facility]

## 2023-05-17 DIAGNOSIS — Z96612 Presence of left artificial shoulder joint: Secondary | ICD-10-CM | POA: Diagnosis not present

## 2023-05-23 DIAGNOSIS — M25512 Pain in left shoulder: Secondary | ICD-10-CM | POA: Diagnosis not present

## 2023-05-23 DIAGNOSIS — M25612 Stiffness of left shoulder, not elsewhere classified: Secondary | ICD-10-CM | POA: Diagnosis not present

## 2023-05-27 DIAGNOSIS — M25512 Pain in left shoulder: Secondary | ICD-10-CM | POA: Diagnosis not present

## 2023-05-27 DIAGNOSIS — M25612 Stiffness of left shoulder, not elsewhere classified: Secondary | ICD-10-CM | POA: Diagnosis not present

## 2023-05-28 DIAGNOSIS — H2512 Age-related nuclear cataract, left eye: Secondary | ICD-10-CM | POA: Diagnosis not present

## 2023-05-28 DIAGNOSIS — H2511 Age-related nuclear cataract, right eye: Secondary | ICD-10-CM | POA: Diagnosis not present

## 2023-05-29 ENCOUNTER — Ambulatory Visit (INDEPENDENT_AMBULATORY_CARE_PROVIDER_SITE_OTHER): Payer: 59

## 2023-05-29 VITALS — Ht 67.5 in | Wt 238.0 lb

## 2023-05-29 DIAGNOSIS — Z0000 Encounter for general adult medical examination without abnormal findings: Secondary | ICD-10-CM

## 2023-05-29 NOTE — Progress Notes (Addendum)
Subjective:   [name] [name] is a 72 y.o. female who presents for Medicare Annual (Subsequent) preventive examination.  Visit Complete: Virtual  I connected with  [name] [name] on 05/29/23 by a audio enabled telemedicine application and verified that I am speaking with the correct person using two identifiers.  Patient Location: Home  Provider Location: Office/Clinic  I discussed the limitations of evaluation and management by telemedicine. The patient expressed understanding and agreed to proceed.  Patient Medicare AWV questionnaire was completed by the patient on (not done); I have confirmed that all information answered by patient is correct and no changes since this date.  Review of Systems    Cardiac Risk Factors include: advanced age (>80men, >57 women);diabetes mellitus;dyslipidemia;hypertension;obesity (BMI >30kg/m2);sedentary lifestyle    Objective:    Today's Vitals   05/29/23 0851  Weight: 238 lb (108 kg)  Height: 5' 7.5" (1.715 m)   Body mass index is 36.73 kg/m.     05/29/2023    9:08 AM 03/13/2022    4:53 PM 03/02/2022    4:13 PM 02/17/2022   11:52 PM 02/05/2022    6:43 AM 01/30/2022   11:40 AM 07/31/2021    1:22 PM  Advanced Directives  Does Patient Have a Medical Advance Directive? Yes No No No No No No  Type of Estate agent of [place];Living will        Would patient like information on creating a medical advance directive?  No - Patient declined No - Patient declined No - Patient declined No - Patient declined No - Patient declined No - Patient declined    Current Medications (verified) Outpatient Encounter Medications as of 05/29/2023  Medication Sig   acetaminophen (TYLENOL) 650 MG CR tablet Take 650 mg by mouth every 8 (eight) hours as needed for pain.   Biotin w/ Vitamins C & E (HAIR/SKIN/NAILS PO) Take 1 tablet by mouth daily.   Brexpiprazole (REXULTI) 0.5 MG TABS Take 1 tablet (0.5 mg total) by mouth daily.   buPROPion  (WELLBUTRIN XL) 300 MG 24 hr tablet TAKE ONE TABLET BY MOUTH DAILY   DULoxetine (CYMBALTA) 60 MG capsule TAKE ONE CAPSULE BY MOUTH DAILY   ezetimibe (ZETIA) 10 MG tablet Take 1 tablet (10 mg total) by mouth daily.   GEMTESA 75 MG TABS Take 1 tablet (75 mg total) by mouth daily.   ketoconazole (NIZORAL) 2 % shampoo APPLY 1 APPLICATION TOPICALLY 2 (TWO) TIMES A WEEK.   losartan (COZAAR) 25 MG tablet TAKE ONE TABLET BY MOUTH DAILY   meloxicam (MOBIC) 15 MG tablet Take 15 mg by mouth daily.   metFORMIN (GLUCOPHAGE) 500 MG tablet TAKE ONE TABLET BY MOUTH TWICE DAILY   polyethylene glycol (MIRALAX / GLYCOLAX) 17 g packet Take 17 g by mouth daily as needed for mild constipation.   pregabalin (LYRICA) 75 MG capsule Take 1 capsule (75 mg total) by mouth 3 (three) times daily.   tirzepatide [facility]) 5 MG/0.5ML Pen Inject 5 mg into the skin once a week.   tolterodine (DETROL LA) 4 MG 24 hr capsule Take 1 capsule (4 mg total) by mouth daily.   No facility-administered encounter medications on file as of 05/29/2023.    Allergies (verified) Sulfa antibiotics, Latex, Doxycycline, and Empagliflozin   History: Past Medical History:  Diagnosis Date   Anxiety    Arthritis    Rhumetoid arthritis   BRCA negative 07/2017   MyRisk neg   Cancer (HCC)    uterine   Cellulitis  of both lower extremities    Chronic   CHF (congestive heart failure) (HCC)    Coronary artery disease    Depression    Diabetes mellitus without complication (HCC)    Family history of breast cancer 07/2017   MyRisk neg; IBIS=10.5%/riskscore=17%   Family history of ovarian cancer    Fibromyalgia    Fibromyalgia affecting shoulder region    Headache    Hyperlipidemia    Hypertension    Spinal stenosis    Past Surgical History:  Procedure Laterality Date   ABLATION SAPHENOUS VEIN W/ RFA     ANAL FISSURECTOMY  01/2004   COLON SURGERY     CORONARY ANGIOPLASTY     CYSTOSCOPY  09/03/2017   Procedure: CYSTOSCOPY;  Surgeon:  Nadara Mustard, MD;  Location: [facility] ORS;  Service: Gynecology;;   DILATION AND CURETTAGE OF UTERUS     ESOPHAGEAL DILATION  02/20/2022   Procedure: ESOPHAGEAL DILATION;  Surgeon: Jeani Hawking, MD;  Location: [facility] END[facility]COPY;  Service: Gastroenterology;;  savory   ESOPHAGOGASTRODUODENOSCOPY (EGD) WITH PROPOFOL N/A 02/20/2022   Procedure: ESOPHAGOGASTRODUODENOSCOPY (EGD) WITH PROPOFOL;  Surgeon: Jeani Hawking, MD;  Location: [facility] ENDOSCOPY;  Service: Gastroenterology;  Laterality: N/A;  Dyspahgia   EYE SURGERY Bilateral 08/2020   eye lift   HERNIA REPAIR     INCISION AND DRAINAGE OF WOUND  03/04/2022   Procedure: IRRIGATION AND DEBRIDEMENT ABDOMEN WITH VAC PLACEMENT, MYRIAD PLACEMENT;  Surgeon: Peggye Form, DO;  Location: [facility] OR;  Service: Plastics;;   LAPAROSCOPIC HYSTERECTOMY N/A 09/03/2017   Procedure: HYSTERECTOMY TOTAL LAPAROSCOPIC BSO;  Surgeon: Nadara Mustard, MD;  Location: [facility] ORS;  Service: Gynecology;  Laterality: N/A;   MEDIAL PARTIAL KNEE REPLACEMENT Left 01/06/2014   PANNICULECTOMY N/A 02/05/2022   Procedure: PANNICULECTOMY;  Surgeon: Peggye Form, DO;  Location: [facility] OR;  Service: Plastics;  Laterality: N/A;  3 hours   UMBILICAL HERNIA REPAIR N/A 09/05/2015   Procedure: HERNIA REPAIR incarcerated UMBILICAL ADULT;  Surgeon: Natale Lay, MD;  Location: [facility] ORS;  Service: General;  Laterality: N/A;   Vascular Stent  11/04/2013   Family History  Problem Relation Age of Onset   Hypertension Mother    Cataracts Mother    Thyroid disease Mother    Dementia Mother    COPD Father    Dementia Father    Cataracts Father    Aneurysm Father        Abdominal & Brain   Diabetes Father    Melanoma Father    Breast cancer Sister 25   Fibromyalgia Sister    Migraines Sister    Hypertension Sister    Breast cancer Sister 77   COPD Sister    Ovarian cancer Sister 64   Migraines Brother    Heart attack Brother    Colon cancer Paternal Uncle        69s   Colon cancer  Paternal Uncle        54s   Uterine cancer Cousin    Uterine cancer Cousin    Social History   Socioeconomic History   Marital status: Widowed    Spouse name: Onalee Hua   Number of children: 1   Years of education: 9th Grade   Highest education level: 9th grade  Occupational History   Occupation: Disabled    Comment: no longer receiving   Occupation: on SS  Tobacco Use   Smoking status: Former    Packs/day: 1.00    Years: 29.00    Additional pack  years: 0.00    Total pack years: 29.00    Types: Cigarettes    Quit date: 12/03/1996    Years since quitting: 26.5    Passive exposure: Past   Smokeless tobacco: Never  Vaping Use   Vaping Use: Never used  Substance and Sexual Activity   Alcohol use: No   Drug use: No   Sexual activity: Not Currently  Other Topics Concern   Not on file  Social History Narrative   Not on file   Social Determinants of Health   Financial Resource Strain: Low Risk  (05/29/2023)   Overall Financial Resource Strain (CARDIA)    Difficulty of Paying Living Expenses: Not very hard  Food Insecurity: No Food Insecurity (05/29/2023)   Hunger Vital Sign    Worried About Running Out of Food in the Last Year: Never true    Ran Out of Food in the Last Year: Never true  Transportation Needs: No Transportation Needs (05/29/2023)   PRAPARE - Administrator, Civil Service (Medical): No    Lack of Transportation (Non-Medical): No  Physical Activity: Inactive (05/29/2023)   Exercise Vital Sign    Days of Exercise per Week: 0 days    Minutes of Exercise per Session: 0 min  Stress: Stress Concern Present (05/29/2023)   [employer] of Occupational Health - Occupational Stress Questionnaire    Feeling of Stress : To some extent  Social Connections: Socially Isolated (05/29/2023)   Social Connection and Isolation Panel [NHANES]    Frequency of Communication with Friends and Family: More than three times a week    Frequency of Social Gatherings with  Friends and Family: More than three times a week    Attends Religious Services: Never    Database administrator or Organizations: No    Attends Banker Meetings: Never    Marital Status: Widowed    Tobacco Counseling Counseling given: Not Answered   Clinical Intake:  Pre-visit preparation completed: Yes  Pain : No/denies pain     BMI - recorded: 36.73 Nutritional Status: BMI > 30  Obese Nutritional Risks: None Diabetes: Yes CBG done?: Yes (BS 129 this am at home) CBG resulted in Enter/ Edit results?: No Did pt. bring in CBG monitor from home?: No  How often do you need to have someone help you when you read instructions, pamphlets, or other written materials from your doctor or pharmacy?: 1 - Never  Interpreter Needed?: No  Comments: lives alone Information entered by :: B.Ansar Skoda,LPN   Activities of Daily Living    05/29/2023    9:08 AM 03/21/2023    2:44 PM  In your present state of health, do you have any difficulty performing the following activities:  Hearing? 0 0  Vision? 1 0  Difficulty concentrating or making decisions? 0 0  Walking or climbing stairs? 1 0  Dressing or bathing? 0 0  Doing errands, shopping? 0 0  Preparing Food and eating ? N   Using the Toilet? N   In the past six months, have you accidently leaked urine? Y   Do you have problems with loss of bowel control? N   Managing your Medications? N   Managing your Finances? N   Housekeeping or managing your Housekeeping? N     Patient Care Team: Erasmo Downer, MD as PCP - General (Family Medicine) Alfredo Martinez, MD as Consulting Physician (Urology) Floyce Stakes, OD (Optometry) Dillingham, Alena Bills, DO as Attending Physician (Plastic  Surgery) Lamar Blinks, MD as Consulting Physician (Cardiology) Lamar Blinks, MD as Consulting Physician (Cardiology)  Indicate any recent Medical Services you may have received from other than [facility] providers in the past  year (date may be approximate).     Assessment:   This is a routine wellness examination for [name]  Hearing/Vision screen Hearing Screening - Comments:: Adequate hearing Vision Screening - Comments:: Adequate vision w/glasses; Cataract surgery next month Dr Larence Penning  Dietary issues and exercise activities discussed:     Goals Addressed             This Visit's Progress    CCM Expected Outcome:  Monitor, Self-Manage and Reduce Symptoms of  depression, SI, financial insecurity   On track    Prevent falls   Not on track    Recommend to remove any items from the home that may cause slips or trips.       Depression Screen    05/29/2023    9:06 AM 03/21/2023    2:43 PM 02/14/2023   11:08 AM 02/01/2023   12:31 PM 01/14/2023   10:44 AM 07/17/2022    2:51 PM 07/04/2022    1:48 PM  PHQ 2/9 Scores  PHQ - 2 Score 0 0 1 0 2 3 2   PHQ- 9 Score  0 2  6 11 7     Fall Risk    05/29/2023    8:57 AM 03/21/2023    2:43 PM 02/14/2023   11:08 AM 01/14/2023   10:44 AM 07/17/2022    2:51 PM  Fall Risk   Falls in the past year? 1 1 0 1 1  Number falls in past yr: 0 0 0 1 0  Injury with Fall? 0 1 0 0 1  Comment     yesterday she fell  Risk for fall due to : No Fall Risks History of fall(s) No Fall Risks History of fall(s)   Follow up Education provided;Falls prevention discussed Falls evaluation completed Falls evaluation completed Falls evaluation completed;Education provided;Falls prevention discussed     MEDICARE RISK AT HOME:  Medicare Risk at Home - 05/29/23 0858     Any stairs in or around the home? Yes    If so, are there any without handrails? Yes    Home free of loose throw rugs in walkways, pet beds, electrical cords, etc? Yes    Adequate lighting in your home to reduce risk of falls? Yes    Life alert? No    Use of a cane, walker or w/c? No    Grab bars in the bathroom? Yes    Shower chair or bench in shower? Yes    Elevated toilet seat or a handicapped toilet? Yes              TIMED UP AND GO:  Was the test performed?  No    Cognitive Function:    01/14/2023   10:45 AM  MMSE - Mini Mental State Exam  Orientation to time 5  Orientation to Place 5  Registration 3  Attention/ Calculation 4  Recall 3  Language- name 2 objects 2  Language- repeat 1  Language- follow 3 step command 3  Language- read & follow direction 1  Write a sentence 1  Copy design 1  Total score 29        05/29/2023    9:15 AM 04/19/2022   11:13 AM 02/02/2020   10:38 AM  6CIT Screen  What Year? 0 points  0 points 0 points  What month? 0 points 0 points 0 points  What time? 0 points 0 points 0 points  Count back from 20 0 points 0 points 0 points  Months in reverse 0 points 0 points 0 points  Repeat phrase 0 points 4 points 0 points  Total Score 0 points 4 points 0 points    Immunizations Immunization History  Administered Date(s) Administered   Influenza,inj,Quad PF,6+ Mos 09/06/2015, 12/13/2016   Pneumococcal Polysaccharide-23 12/13/2016    TDAP status: Due, Education has been provided regarding the importance of this vaccine. Advised may receive this vaccine at local pharmacy or Health Dept. Aware to provide a copy of the vaccination record if obtained from local pharmacy or Health Dept. Verbalized acceptance and understanding.  Flu Vaccine status: Declined, Education has been provided regarding the importance of this vaccine but patient still declined. Advised may receive this vaccine at local pharmacy or Health Dept. Aware to provide a copy of the vaccination record if obtained from local pharmacy or Health Dept. Verbalized acceptance and understanding.  Pneumococcal vaccine status: Up to date  Covid-19 vaccine status: Completed vaccines  Qualifies for Shingles Vaccine? Yes   Zostavax completed No   Shingrix Completed?: No.    Education has been provided regarding the importance of this vaccine. Patient has been advised to call insurance company to determine out of  pocket expense if they have not yet received this vaccine. Advised may also receive vaccine at local pharmacy or Health Dept. Verbalized acceptance and understanding.  Screening Tests Health Maintenance  Topic Date Due   COVID-19 Vaccine (1) Never done   DTaP/Tdap/Td (1 - Tdap) Never done   Zoster Vaccines- Shingrix (1 of 2) Never done   Colonoscopy  Never done   MAMMOGRAM  Never done   Pneumonia Vaccine 83+ Years old (2 of 2 - PCV) 09/20/2018   DEXA SCAN  Never done   Diabetic kidney evaluation - Urine ACR  04/20/2023   FOOT EXAM  04/20/2023   INFLUENZA VACCINE  07/04/2023   HEMOGLOBIN A1C  08/17/2023   Diabetic kidney evaluation - eGFR measurement  02/14/2024   OPHTHALMOLOGY EXAM  03/19/2024   Medicare Annual Wellness (AWV)  05/28/2024   Hepatitis C Screening  Completed   HPV VACCINES  Aged Out    Health Maintenance  Health Maintenance Due  Topic Date Due   COVID-19 Vaccine (1) Never done   DTaP/Tdap/Td (1 - Tdap) Never done   Zoster Vaccines- Shingrix (1 of 2) Never done   Colonoscopy  Never done   MAMMOGRAM  Never done   Pneumonia Vaccine 83+ Years old (2 of 2 - PCV) 09/20/2018   DEXA SCAN  Never done   Diabetic kidney evaluation - Urine ACR  04/20/2023   FOOT EXAM  04/20/2023    Colorectal cancer screening: Type of screening: Colonoscopy. Completed no. Repeat every 5-10 years will talk PCP about  Mammogram status: Completed no. Repeat every year wants to talk to PCP about this and Bone density   Lung Cancer Screening: (Low Dose CT Chest recommended if Age 104-80 years, 20 pack-year currently smoking OR have quit w/in 15years.) does not qualify.   Lung Cancer Screening Referral: no  Additional Screening:  Hepatitis C Screening: does not qualify; Completed yes  Vision Screening: Recommended annual ophthalmology exams for early detection of glaucoma and other disorders of the eye. Is the patient up to date with their annual eye exam?  Yes  Who is the provider or  what is the name of the office in which the patient attends annual eye exams? Dr Larence Penning If pt is not established with a provider, would they like to be referred to a provider to establish care? No .   Dental Screening: Recommended annual dental exams for proper oral hygiene  Diabetic Foot Exam: Diabetic Foot Exam: Completed yes  Community Resource Referral / Chronic Care Management: CRR required this visit?  No   CCM required this visit?  No     Plan:     I have personally reviewed and noted the following in the patient's chart:   Medical and social history Use of alcohol, tobacco or illicit drugs  Current medications and supplements including opioid prescriptions. Patient is not currently taking opioid prescriptions. Functional ability and status Nutritional status Physical activity Advanced directives List of other physicians Hospitalizations, surgeries, and ER visits in previous 12 months Vitals Screenings to include cognitive, depression, and falls Referrals and appointments  In addition, I have reviewed and discussed with patient certain preventive protocols, quality metrics, and best practice recommendations. A written personalized care plan for preventive services as well as general preventive health recommendations were provided to patient.     Sue Lush, LPN   03/11/8118   After Visit Summary: (MyChart) Due to this being a telephonic visit, the after visit summary with patients personalized plan was offered to patient via MyChart   Nurse Notes: Pt is doing well. she/her/hers declines care gaps until she talks with PCP on tomorrow. No concerns or questions voiced.    I have reviewed the health advisor's note, was available for consultation, and agree with documentation and plan  Despina Arias [facility] 05/29/2023 9:28 PM

## 2023-05-29 NOTE — Patient Instructions (Signed)
[name] [name] , Thank you for taking time to come for your Medicare Wellness Visit. I appreciate your ongoing commitment to your health goals. Please review the following plan we discussed and let me know if I can assist you in the future.   These are the goals we discussed:  Goals      care coordination activites     Care Coordination Interventions:  SDOH screen completed Patient confirmed having no financial needs at this time       CCM Expected Outcome:  Monitor, Self-Manage and Reduce Symptoms of  depression, SI, financial insecurity     Prevent falls     Recommend to remove any items from the home that may cause slips or trips.        This is a list of the screening recommended for you and due dates:  Health Maintenance  Topic Date Due   COVID-19 Vaccine (1) Never done   DTaP/Tdap/Td vaccine (1 - Tdap) Never done   Zoster (Shingles) Vaccine (1 of 2) Never done   Colon Cancer Screening  Never done   Mammogram  Never done   Pneumonia Vaccine (2 of 2 - PCV) 09/20/2018   DEXA scan (bone density measurement)  Never done   Yearly kidney health urinalysis for diabetes  04/20/2023   Complete foot exam   04/20/2023   Flu Shot  07/04/2023   Hemoglobin A1C  08/17/2023   Yearly kidney function blood test for diabetes  02/14/2024   Eye exam for diabetics  03/19/2024   Medicare Annual Wellness Visit  05/28/2024   Hepatitis C Screening  Completed   HPV Vaccine  Aged Out    Advanced directives: yes  Conditions/risks identified: moderate fall risk  Next appointment: Follow up in one year for your annual wellness visit 06/01/2024 @ 10:15am telephone   Preventive Care 65 Years and Older, Female Preventive care refers to lifestyle choices and visits with your health care provider that can promote health and wellness. What does preventive care include? A yearly physical exam. This is also called an annual well check. Dental exams once or twice a year. Routine eye exams. Ask your  health care provider how often you should have your eyes checked. Personal lifestyle choices, including: Daily care of your teeth and gums. Regular physical activity. Eating a healthy diet. Avoiding tobacco and drug use. Limiting alcohol use. Practicing safe sex. Taking low-dose aspirin every day. Taking vitamin and mineral supplements as recommended by your health care provider. What happens during an annual well check? The services and screenings done by your health care provider during your annual well check will depend on your age, overall health, lifestyle risk factors, and family history of disease. Counseling  Your health care provider may ask you questions about your: Alcohol use. Tobacco use. Drug use. Emotional well-being. Home and relationship well-being. Sexual activity. Eating habits. History of falls. Memory and ability to understand (cognition). Work and work Astronomer. Reproductive health. Screening  You may have the following tests or measurements: Height, weight, and BMI. Blood pressure. Lipid and cholesterol levels. These may be checked every 5 years, or more frequently if you are over 52 years old. Skin check. Lung cancer screening. You may have this screening every year starting at age 70 if you have a 30-pack-year history of smoking and currently smoke or have quit within the past 15 years. Fecal occult blood test (FOBT) of the stool. You may have this test every year starting at age 70. Flexible  sigmoidoscopy or colonoscopy. You may have a sigmoidoscopy every 5 years or a colonoscopy every 10 years starting at age 9. Hepatitis C blood test. Hepatitis B blood test. Sexually transmitted disease (STD) testing. Diabetes screening. This is done by checking your blood sugar (glucose) after you have not eaten for a while (fasting). You may have this done every 1-3 years. Bone density scan. This is done to screen for osteoporosis. You may have this done  starting at age 70. Mammogram. This may be done every 1-2 years. Talk to your health care provider about how often you should have regular mammograms. Talk with your health care provider about your test results, treatment options, and if necessary, the need for more tests. Vaccines  Your health care provider may recommend certain vaccines, such as: Influenza vaccine. This is recommended every year. Tetanus, diphtheria, and acellular pertussis (Tdap, Td) vaccine. You may need a Td booster every 10 years. Zoster vaccine. You may need this after age 77. Pneumococcal 13-valent conjugate (PCV13) vaccine. One dose is recommended after age 70. Pneumococcal polysaccharide (PPSV23) vaccine. One dose is recommended after age 70. Talk to your health care provider about which screenings and vaccines you need and how often you need them. This information is not intended to replace advice given to you by your health care provider. Make sure you discuss any questions you have with your health care provider. Document Released: 12/16/2015 Document Revised: 08/08/2016 Document Reviewed: 09/20/2015 Elsevier Interactive Patient Education  2017 [place] Prevention in the Home Falls can cause injuries. They can happen to people of all ages. There are many things you can do to make your home safe and to help prevent falls. What can I do on the outside of my home? Regularly fix the edges of walkways and driveways and fix any cracks. Remove anything that might make you trip as you walk through a door, such as a raised step or threshold. Trim any bushes or trees on the path to your home. Use bright outdoor lighting. Clear any walking paths of anything that might make someone trip, such as rocks or tools. Regularly check to see if handrails are loose or broken. Make sure that both sides of any steps have handrails. Any raised decks and porches should have guardrails on the edges. Have any leaves, snow, or  ice cleared regularly. Use sand or salt on walking paths during winter. Clean up any spills in your garage right away. This includes oil or grease spills. What can I do in the bathroom? Use night lights. Install grab bars by the toilet and in the tub and shower. Do not use towel bars as grab bars. Use non-skid mats or decals in the tub or shower. If you need to sit down in the shower, use a plastic, non-slip stool. Keep the floor dry. Clean up any water that spills on the floor as soon as it happens. Remove soap buildup in the tub or shower regularly. Attach bath mats securely with double-sided non-slip rug tape. Do not have throw rugs and other things on the floor that can make you trip. What can I do in the bedroom? Use night lights. Make sure that you have a light by your bed that is easy to reach. Do not use any sheets or blankets that are too big for your bed. They should not hang down onto the floor. Have a firm chair that has side arms. You can use this for support while you get dressed.  Do not have throw rugs and other things on the floor that can make you trip. What can I do in the kitchen? Clean up any spills right away. Avoid walking on wet floors. Keep items that you use a lot in easy-to-reach places. If you need to reach something above you, use a strong step stool that has a grab bar. Keep electrical cords out of the way. Do not use floor polish or wax that makes floors slippery. If you must use wax, use non-skid floor wax. Do not have throw rugs and other things on the floor that can make you trip. What can I do with my stairs? Do not leave any items on the stairs. Make sure that there are handrails on both sides of the stairs and use them. Fix handrails that are broken or loose. Make sure that handrails are as long as the stairways. Check any carpeting to make sure that it is firmly attached to the stairs. Fix any carpet that is loose or worn. Avoid having throw rugs at  the top or bottom of the stairs. If you do have throw rugs, attach them to the floor with carpet tape. Make sure that you have a light switch at the top of the stairs and the bottom of the stairs. If you do not have them, ask someone to add them for you. What else can I do to help prevent falls? Wear shoes that: Do not have high heels. Have rubber bottoms. Are comfortable and fit you well. Are closed at the toe. Do not wear sandals. If you use a stepladder: Make sure that it is fully opened. Do not climb a closed stepladder. Make sure that both sides of the stepladder are locked into place. Ask someone to hold it for you, if possible. Clearly mark and make sure that you can see: Any grab bars or handrails. First and last steps. Where the edge of each step is. Use tools that help you move around (mobility aids) if they are needed. These include: Canes. Walkers. Scooters. Crutches. Turn on the lights when you go into a dark area. Replace any light bulbs as soon as they burn out. Set up your furniture so you have a clear path. Avoid moving your furniture around. If any of your floors are uneven, fix them. If there are any pets around you, be aware of where they are. Review your medicines with your doctor. Some medicines can make you feel dizzy. This can increase your chance of falling. Ask your doctor what other things that you can do to help prevent falls. This information is not intended to replace advice given to you by your health care provider. Make sure you discuss any questions you have with your health care provider. Document Released: 09/15/2009 Document Revised: 04/26/2016 Document Reviewed: 12/24/2014 Elsevier Interactive Patient Education  2017 [employer].

## 2023-05-30 DIAGNOSIS — M25512 Pain in left shoulder: Secondary | ICD-10-CM | POA: Diagnosis not present

## 2023-05-30 DIAGNOSIS — M25612 Stiffness of left shoulder, not elsewhere classified: Secondary | ICD-10-CM | POA: Diagnosis not present

## 2023-05-31 ENCOUNTER — Encounter: Payer: 59 | Admitting: Family Medicine

## 2023-05-31 ENCOUNTER — Ambulatory Visit: Payer: Self-pay | Admitting: *Deleted

## 2023-05-31 NOTE — Telephone Encounter (Signed)
  Chief Complaint: diarrhea that started at 3 am this morning.  Having gas. Symptoms: Had 3-4 episodes of loose stools since 3 am.  Frequency: 3-4 times since 3 am Pertinent Negatives: Patient denies fever, abd pain, vomiting, sick exposures, blood in stool. Disposition: [] ED /[] Urgent Care (no appt availability in office) / [] Appointment(In office/virtual)/ []  [facility]/ [x] Home Care/ [] Refused Recommended Disposition /[] [facility]/ []  Follow-up with PCP Additional Notes: She think it's from a shrimp salad she ate last night.   She cannot take Pepto Bismol or Imodium.   "Those make me vomit".   "I don't like to take any of that stuff".    "I usually just stay hydrated".    I went over the care advice with her.   Advised her if her glucose becomes very elevated beyond her normal or she develops dizziness or weakness to go to the urgent care or ED depending on her symptoms.   She was agreeable to this plan.  I sent this to Dr. Beryle Flock for her information.

## 2023-05-31 NOTE — Telephone Encounter (Signed)
Message from Valora Piccolo sent at 05/31/2023  7:38 AM EDT  Summary: Diarrhea Advice   Pt is calling to report diarrhea that started in the middle of the night with stomach rumblings Please advise          Call History   Type Contact Phone/Fax User  05/31/2023 07:37 AM EDT Phone (Incoming) [name] [name] "[name] [name]" (Self) [phone] Judie Petit) Jens Som A   Reason for Disposition  MILD-MODERATE diarrhea (e.g., 1-6 times / day more than normal)  Answer Assessment - Initial Assessment Questions 1. DIARRHEA SEVERITY: "How bad is the diarrhea?" "How many more stools have you had in the past 24 hours than normal?"    - NO DIARRHEA (SCALE 0)   - MILD (SCALE 1-3): Few loose or mushy BMs; increase of 1-3 stools over normal daily number of stools; mild increase in ostomy output.   -  MODERATE (SCALE 4-7): Increase of 4-6 stools daily over normal; moderate increase in ostomy output.   -  SEVERE (SCALE 8-10; OR "WORST POSSIBLE"): Increase of 7 or more stools daily over normal; moderate increase in ostomy output; incontinence.     I'm having diarrhea.   I'm afraid to go out.   I started at 3:00 AM this morning.   I can't take Pepto Bismol or Imodium.   It makes me vomit.   2. ONSET: "When did the diarrhea begin?"      3:00 AM the diarrhea started.   I don't want to have an accident.    I'm drinking water all the time.   I'm drinking Ginger Ale.   I had gas very bad.   3. BM CONSISTENCY: "How loose or watery is the diarrhea?"      It's just really loose.    No blood.    I think it was something I ate.   I had shrimp salad last night     It might have been that.   I've not been exposed to anyone sick that I know of. 4. VOMITING: "Are you also vomiting?" If Yes, ask: "How many times in the past 24 hours?"      No 5. ABDOMEN PAIN: "Are you having any abdomen pain?" If Yes, ask: "What does it feel like?" (e.g., crampy, dull, intermittent, constant)      No fever or no vomiting.      No abd pain.     Just gas.    I don't take stuff like that. 6. ABDOMEN PAIN SEVERITY: If present, ask: "How bad is the pain?"  (e.g., Scale 1-10; mild, moderate, or severe)   - MILD (1-3): doesn't interfere with normal activities, abdomen soft and not tender to touch    - MODERATE (4-7): interferes with normal activities or awakens from sleep, abdomen tender to touch    - SEVERE (8-10): excruciating pain, doubled over, unable to do any normal activities       I just prefer not to take anything.    7. ORAL INTAKE: If vomiting, "Have you been able to drink liquids?" "How much liquids have you had in the past 24 hours?"     Keep drinking.    I'mnot pale or weak or dizzy.    I'm feeling fine except going to the bathroom 3-4 times this morning. 8. HYDRATION: "Any signs of dehydration?" (e.g., dry mouth [not just dry lips], too weak to stand, dizziness, new weight loss) "When did you last urinate?"     No 9. EXPOSURE: "Have  you traveled to a foreign country recently?" "Have you been exposed to anyone with diarrhea?" "Could you have eaten any food that was spoiled?"     No 10. ANTIBIOTIC USE: "Are you taking antibiotics now or have you taken antibiotics in the past 2 months?"       I've been on antibiotic.   June 1 I had shoulder surgery.   So I was on antibiotics.      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  11. OTHER SYMPTOMS: "Do you have any other symptoms?" (e.g., fever, blood in stool)       Not fever 12. PREGNANCY: "Is there any chance you are pregnant?" "When was your last menstrual period?"       N/A due to age  Protocols used: [facility]

## 2023-05-31 NOTE — Telephone Encounter (Signed)
Noted  

## 2023-06-04 ENCOUNTER — Encounter: Payer: Self-pay | Admitting: Ophthalmology

## 2023-06-05 NOTE — Anesthesia Preprocedure Evaluation (Addendum)
Anesthesia Evaluation  Patient identified by MRN, date of birth, ID band Patient awake    Reviewed: Allergy & Precautions, H&P , NPO status , Patient's Chart, lab work & pertinent test results  Airway Mallampati: [name]  TM Distance: >3 FB Neck ROM: Full    Dental  (+) Partial Upper   Pulmonary former smoker   Pulmonary exam normal breath sounds clear to auscultation       Cardiovascular hypertension, + CAD, + Peripheral Vascular Disease and +CHF  Normal cardiovascular exam Rhythm:Regular Rate:Normal  09-05-15 Procedure narrative: Transthoracic echocardiography. Image    quality was suboptimal. The study was technically difficult.  - Left ventricle: The cavity size was moderately dilated. Systolic    function was severely reduced. The estimated ejection fraction    was in the range of 25% to 30%. Although no diagnostic regional    wall motion abnormality was identified, this possibility cannot    be completely excluded on the basis of this study.  - Mitral valve: There was mild regurgitation.  - Left atrium: The atrium was mildly dilated.  - Right atrium: The atrium was mildly dilated.   Chronic systolic CHF NYHA Class [name]    Neuro/Psych  Headaches PSYCHIATRIC DISORDERS Anxiety Depression     Neuromuscular disease    GI/Hepatic Neg liver ROS, PUD,,,  Endo/Other  diabetes    Renal/GU negative Renal ROS  negative genitourinary   Musculoskeletal  (+) Arthritis ,  Fibromyalgia -  Abdominal   Peds negative pediatric ROS (+)  Hematology  (+) Blood dyscrasia, anemia   Anesthesia Other Findings Fibromyalgia affecting shoulder region Diabetes mellitus without complication  Cellulitis of both lower extremities Coronary artery disease Fibromyalgia  Chronic systolic CHF  cardiomyopathy Spinal stenosis  Arthritis Hypertension  Hyperlipidemia Cancer (HCC) Anxiety  Depression Headache  Torn rotator  cuff COVID-19  Wears dentures    Reproductive/Obstetrics negative OB ROS                             Anesthesia Physical Anesthesia Plan  ASA: 4  Anesthesia Plan: MAC   Post-op Pain Management:    Induction: Intravenous  PONV Risk Score and Plan:   Airway Management Planned: Natural Airway and Nasal Cannula  Additional Equipment:   Intra-op Plan:   Post-operative Plan:   Informed Consent: I have reviewed the patients History and Physical, chart, labs and discussed the procedure including the risks, benefits and alternatives for the proposed anesthesia with the patient or authorized representative who has indicated his/her understanding and acceptance.     Dental Advisory Given  Plan Discussed with: Anesthesiologist, CRNA and Surgeon  Anesthesia Plan Comments: (Patient consented for risks of anesthesia including but not limited to:  - adverse reactions to medications - damage to eyes, teeth, lips or other oral mucosa - nerve damage due to positioning  - sore throat or hoarseness - Damage to heart, brain, nerves, lungs, other parts of body or loss of life  Patient voiced understanding.)        Anesthesia Quick Evaluation

## 2023-06-06 ENCOUNTER — Other Ambulatory Visit: Payer: Self-pay | Admitting: Family Medicine

## 2023-06-07 ENCOUNTER — Other Ambulatory Visit: Payer: Self-pay | Admitting: Pharmacist

## 2023-06-07 NOTE — Progress Notes (Signed)
   06/07/2023  Patient ID: Delton Prairie, female   DOB: 10/11/1951, 72 y.o.   MRN: [id number]  Pharmacy Quality Measure Review  This patient is appearing on a report for being at risk of failing the adherence measure for hypertension (ACEi/ARB) medications this calendar year.   Medication: losartan 25 mg Last fill date: 05/21/2023 for 30 day supply  From review of chart, note PCP sent new prescription for 90 day supply of patient's losartan 25 mg to pharmacy today  Based on refill data, note previous gaps in refills. Was unable to reach patient via telephone today and unable to leave a message as her voicemail is full  Estelle Grumbles, PharmD, [facility] [phone]

## 2023-06-10 DIAGNOSIS — M25612 Stiffness of left shoulder, not elsewhere classified: Secondary | ICD-10-CM | POA: Diagnosis not present

## 2023-06-10 DIAGNOSIS — M25512 Pain in left shoulder: Secondary | ICD-10-CM | POA: Diagnosis not present

## 2023-06-11 NOTE — Discharge Instructions (Signed)

## 2023-06-13 ENCOUNTER — Encounter: Payer: Self-pay | Admitting: Ophthalmology

## 2023-06-13 ENCOUNTER — Ambulatory Visit: Payer: 59 | Admitting: Anesthesiology

## 2023-06-13 ENCOUNTER — Encounter
Admission: RE | Disposition: A | Discharge status: Home / Self Care | Payer: Self-pay | Source: Home / Self Care | Attending: Ophthalmology

## 2023-06-13 ENCOUNTER — Other Ambulatory Visit: Payer: Self-pay | Admitting: Family Medicine

## 2023-06-13 ENCOUNTER — Ambulatory Visit: Payer: Self-pay | Admitting: Anesthesiology

## 2023-06-13 ENCOUNTER — Ambulatory Visit
Admission: RE | Admit: 2023-06-13 | Discharge: 2023-06-13 | Disposition: A | Discharge status: Home / Self Care | Payer: 59 | Attending: Ophthalmology | Admitting: Ophthalmology

## 2023-06-13 ENCOUNTER — Other Ambulatory Visit: Payer: Self-pay

## 2023-06-13 DIAGNOSIS — E1136 Type 2 diabetes mellitus with diabetic cataract: Secondary | ICD-10-CM | POA: Diagnosis not present

## 2023-06-13 DIAGNOSIS — E1151 Type 2 diabetes mellitus with diabetic peripheral angiopathy without gangrene: Secondary | ICD-10-CM | POA: Diagnosis not present

## 2023-06-13 DIAGNOSIS — I2510 Atherosclerotic heart disease of native coronary artery without angina pectoris: Secondary | ICD-10-CM | POA: Insufficient documentation

## 2023-06-13 DIAGNOSIS — H2512 Age-related nuclear cataract, left eye: Secondary | ICD-10-CM | POA: Diagnosis not present

## 2023-06-13 DIAGNOSIS — Z8711 Personal history of peptic ulcer disease: Secondary | ICD-10-CM | POA: Diagnosis not present

## 2023-06-13 DIAGNOSIS — G709 Myoneural disorder, unspecified: Secondary | ICD-10-CM | POA: Diagnosis not present

## 2023-06-13 DIAGNOSIS — F418 Other specified anxiety disorders: Secondary | ICD-10-CM | POA: Insufficient documentation

## 2023-06-13 DIAGNOSIS — Z7984 Long term (current) use of oral hypoglycemic drugs: Secondary | ICD-10-CM | POA: Insufficient documentation

## 2023-06-13 DIAGNOSIS — Z87891 Personal history of nicotine dependence: Secondary | ICD-10-CM | POA: Insufficient documentation

## 2023-06-13 DIAGNOSIS — M1990 Unspecified osteoarthritis, unspecified site: Secondary | ICD-10-CM | POA: Diagnosis not present

## 2023-06-13 DIAGNOSIS — M797 Fibromyalgia: Secondary | ICD-10-CM | POA: Insufficient documentation

## 2023-06-13 DIAGNOSIS — I5022 Chronic systolic (congestive) heart failure: Secondary | ICD-10-CM | POA: Diagnosis not present

## 2023-06-13 DIAGNOSIS — Z7985 Long-term (current) use of injectable non-insulin antidiabetic drugs: Secondary | ICD-10-CM | POA: Diagnosis not present

## 2023-06-13 DIAGNOSIS — I110 Hypertensive heart disease with heart failure: Secondary | ICD-10-CM | POA: Diagnosis not present

## 2023-06-13 DIAGNOSIS — E1142 Type 2 diabetes mellitus with diabetic polyneuropathy: Secondary | ICD-10-CM | POA: Diagnosis not present

## 2023-06-13 HISTORY — DX: Presence of dental prosthetic device (complete) (partial): Z97.2

## 2023-06-13 HISTORY — PX: CATARACT EXTRACTION W/PHACO: SHX586

## 2023-06-13 HISTORY — DX: Unspecified rotator cuff tear or rupture of unspecified shoulder, not specified as traumatic: M75.100

## 2023-06-13 LAB — GLUCOSE, CAPILLARY: Glucose-Capillary: 118 mg/dL — ABNORMAL HIGH (ref 70–99)

## 2023-06-13 SURGERY — PHACOEMULSIFICATION, CATARACT, WITH IOL INSERTION
Anesthesia: Monitor Anesthesia Care | Site: Eye | Laterality: Left

## 2023-06-13 MED ORDER — TRIAMCINOLONE ACETONIDE 0.5 % EX OINT: 1.0000 | TOPICAL_OINTMENT | Freq: Two times a day (BID) | CUTANEOUS | 0 refills | Status: DC

## 2023-06-13 MED ORDER — LIDOCAINE HCL (PF) 2 % IJ SOLN
INTRAOCULAR | Status: DC | PRN
  Administered 2023-06-13: 1 mL via INTRAOCULAR

## 2023-06-13 MED ORDER — MOXIFLOXACIN HCL 0.5 % OP SOLN
OPHTHALMIC | Status: DC | PRN
  Administered 2023-06-13: .2 mL via OPHTHALMIC

## 2023-06-13 MED ORDER — SIGHTPATH DOSE#1 BSS IO SOLN
INTRAOCULAR | Status: DC | PRN
  Administered 2023-06-13: 15 mL

## 2023-06-13 MED ORDER — SIGHTPATH DOSE#1 BSS IO SOLN
INTRAOCULAR | Status: DC | PRN
  Administered 2023-06-13: 58 mL via OPHTHALMIC

## 2023-06-13 MED ORDER — FENTANYL CITRATE (PF) 100 MCG/2ML IJ SOLN
INTRAMUSCULAR | Status: DC | PRN
  Administered 2023-06-13: 50 ug via INTRAVENOUS

## 2023-06-13 MED ORDER — LACTATED RINGERS IV SOLN: INTRAVENOUS | Status: DC

## 2023-06-13 MED ORDER — MIDAZOLAM HCL 2 MG/2ML IJ SOLN
INTRAMUSCULAR | Status: DC | PRN
  Administered 2023-06-13 (×2): 1 mg via INTRAVENOUS

## 2023-06-13 MED ORDER — TETRACAINE HCL 0.5 % OP SOLN
1.0000 [drp] | OPHTHALMIC | Status: DC | PRN
  Administered 2023-06-13 (×3): 1 [drp] via OPHTHALMIC

## 2023-06-13 MED ORDER — BRIMONIDINE TARTRATE-TIMOLOL 0.2-0.5 % OP SOLN
OPHTHALMIC | Status: DC | PRN
  Administered 2023-06-13: 1 [drp] via OPHTHALMIC

## 2023-06-13 MED ORDER — SIGHTPATH DOSE#1 NA HYALUR & NA CHOND-NA HYALUR IO KIT
PACK | INTRAOCULAR | Status: DC | PRN
  Administered 2023-06-13: 1 via OPHTHALMIC

## 2023-06-13 MED ORDER — ARMC OPHTHALMIC DILATING DROPS
1.0000 | OPHTHALMIC | Status: DC | PRN
  Administered 2023-06-13 (×3): 1 via OPHTHALMIC

## 2023-06-13 SURGICAL SUPPLY — 9 items
CATARACT SUITE SIGHTPATH (MISCELLANEOUS) ×1 IMPLANT
DISSECTOR HYDRO NUCLEUS 50X22 (MISCELLANEOUS) ×1 IMPLANT
DRSG TEGADERM 2-3/8X2-3/4 SM (GAUZE/BANDAGES/DRESSINGS) ×1 IMPLANT
FEE CATARACT SUITE SIGHTPATH (MISCELLANEOUS) ×1 IMPLANT
GLOVE SURG SYN 7.5 E (GLOVE) ×1 IMPLANT
GLOVE SURG SYN 7.5 PF PI (GLOVE) ×1 IMPLANT
GLOVE SURG SYN 8.5 E (GLOVE) ×1 IMPLANT
GLOVE SURG SYN 8.5 PF PI (GLOVE) ×1 IMPLANT
LENS IOL TECNIS EYHANCE 26.5 (Intraocular Lens) IMPLANT

## 2023-06-13 NOTE — Op Note (Signed)
OPERATIVE NOTE  [name] [name] [id number] 06/13/2023   PREOPERATIVE DIAGNOSIS: Nuclear sclerotic cataract left eye. H25.12   POSTOPERATIVE DIAGNOSIS: Nuclear sclerotic cataract left eye. H25.12   PROCEDURE:  Phacoemusification with posterior chamber intraocular lens placement of the left eye  Ultrasound time: Procedure(s): CATARACT EXTRACTION PHACO AND INTRAOCULAR LENS PLACEMENT (IOC) LEFT DIABETIC  5.79  00:42.7 (Left)  LENS:   Implant Name Type Inv. Item Serial No. Manufacturer Lot No. LRB No. Used Action  LENS IOL TECNIS EYHANCE 26.5 - [id number] Intraocular Lens LENS IOL TECNIS EYHANCE 26.5 [id number] [employer]  Left 1 Implanted      SURGEON:  Julious Payer. Rolley Sims, MD   ANESTHESIA:  Topical with tetracaine drops, augmented with 1% preservative-free intracameral lidocaine.   COMPLICATIONS:  None.   DESCRIPTION OF PROCEDURE:  The patient was identified in the holding room and transported to the operating room and placed in the supine position under the operating microscope.  The left eye was identified as the operative eye, which was prepped and draped in the usual sterile ophthalmic fashion.   A 1 millimeter clear-corneal paracentesis was made inferotemporally. Preservative-free 1% lidocaine mixed with 1:1,000 bisulfite-free aqueous solution of epinephrine was injected into the anterior chamber. The anterior chamber was then filled with Viscoat viscoelastic. A 2.4 millimeter keratome was used to make a clear-corneal incision superotemporally. A curvilinear capsulorrhexis was made with a cystotome and capsulorrhexis forceps. Balanced salt solution was used to hydrodissect and hydrodelineate the nucleus. Phacoemulsification was then used to remove the lens nucleus and epinucleus. The remaining cortex was then removed using the irrigation and aspiration handpiece. Provisc was then placed into the capsular bag to distend it for lens placement. An +26.50 D DIB00 intraocular lens was then injected  into the capsular bag. The remaining viscoelastic was aspirated.   Wounds were hydrated with balanced salt solution.  The anterior chamber was inflated to a physiologic pressure with balanced salt solution.  No wound leaks were noted. Vigamox was injected intracamerally.  Timolol and Brimonidine drops were applied to the eye.  The patient was taken to the recovery room in stable condition without complications of anesthesia or surgery.  Rolly Pancake [place] 06/13/2023, 7:56 AM

## 2023-06-13 NOTE — Anesthesia Postprocedure Evaluation (Signed)
Anesthesia Post Note  Patient: [name] [name]  Procedure(s) Performed: CATARACT EXTRACTION PHACO AND INTRAOCULAR LENS PLACEMENT (IOC) LEFT DIABETIC  5.79  00:42.7 (Left: Eye)  Patient location during evaluation: PACU Anesthesia Type: MAC Level of consciousness: awake and alert Pain management: pain level controlled Vital Signs Assessment: post-procedure vital signs reviewed and stable Respiratory status: spontaneous breathing, nonlabored ventilation, respiratory function stable and patient connected to nasal cannula oxygen Cardiovascular status: stable and blood pressure returned to baseline Postop Assessment: no apparent nausea or vomiting Anesthetic complications: no   No notable events documented.   Last Vitals:  Vitals:   06/13/23 0757 06/13/23 0805  BP: (!) 126/97 (!) 122/52  Pulse: 75 72  Resp: 17 17  Temp: 36.6 C 36.6 C  SpO2: 98% 96%    Last Pain:  Vitals:   06/13/23 0805  TempSrc:   PainSc: 0-No pain                 Senita Corredor C Maghan [place]

## 2023-06-13 NOTE — Transfer of Care (Signed)
Immediate Anesthesia Transfer of Care Note  Patient: [name] [name]  Procedure(s) Performed: CATARACT EXTRACTION PHACO AND INTRAOCULAR LENS PLACEMENT (IOC) LEFT DIABETIC  5.79  00:42.7 (Left: Eye)  Patient Location: PACU  Anesthesia Type: MAC  Level of Consciousness: awake, alert  and patient cooperative  Airway and Oxygen Therapy: Patient Spontanous Breathing and Patient connected to supplemental oxygen  Post-op Assessment: Post-op Vital signs reviewed, Patient's Cardiovascular Status Stable, Respiratory Function Stable, Patent Airway and No signs of Nausea or vomiting  Post-op Vital Signs: Reviewed and stable  Complications: No notable events documented.

## 2023-06-13 NOTE — H&P (Signed)
[facility]   Primary Care Physician:  Erasmo Downer, MD Ophthalmologist: Dr. Deberah Pelton  Pre-Procedure History & Physical: HPI:  [name] [name] is a 71 y.o. female here for cataract surgery.   Past Medical History:  Diagnosis Date   Anxiety    Arthritis    Rhumetoid arthritis   BRCA negative 07/2017   MyRisk neg   Cancer (HCC)    uterine   Cellulitis of both lower extremities    Chronic   CHF (congestive heart failure) (HCC)    Coronary artery disease    COVID-19 11/2020   Depression    Diabetes mellitus without complication (HCC)    Family history of breast cancer 07/2017   MyRisk neg; IBIS=10.5%/riskscore=17%   Family history of ovarian cancer    Fibromyalgia    Fibromyalgia affecting shoulder region    Headache    Hyperlipidemia    Hypertension    Spinal stenosis    Torn rotator cuff    bilateral   Wears dentures    full upper    Past Surgical History:  Procedure Laterality Date   ABLATION SAPHENOUS VEIN W/ RFA     ANAL FISSURECTOMY  01/2004   COLON SURGERY     CORONARY ANGIOPLASTY     CYSTOSCOPY  09/03/2017   Procedure: CYSTOSCOPY;  Surgeon: Nadara Mustard, MD;  Location: [facility] ORS;  Service: Gynecology;;   DILATION AND CURETTAGE OF UTERUS     ESOPHAGEAL DILATION  02/20/2022   Procedure: ESOPHAGEAL DILATION;  Surgeon: Jeani Hawking, MD;  Location: [facility] ENDOSCOPY;  Service: Gastroenterology;;  savory   ESOPHAGOGASTRODUODENOSCOPY (EGD) WITH PROPOFOL N/A 02/20/2022   Procedure: ESOPHAGOGASTRODUODENOSCOPY (EGD) WITH PROPOFOL;  Surgeon: Jeani Hawking, MD;  Location: [facility] ENDOSCOPY;  Service: Gastroenterology;  Laterality: N/A;  Dyspahgia   EYE SURGERY Bilateral 08/2020   eye lift   HERNIA REPAIR     INCISION AND DRAINAGE OF WOUND  03/04/2022   Procedure: IRRIGATION AND DEBRIDEMENT ABDOMEN WITH VAC PLACEMENT, MYRIAD PLACEMENT;  Surgeon: Peggye Form, DO;  Location: [facility] OR;  Service: Plastics;;   LAPAROSCOPIC HYSTERECTOMY N/A 09/03/2017    Procedure: HYSTERECTOMY TOTAL LAPAROSCOPIC BSO;  Surgeon: Nadara Mustard, MD;  Location: [facility] ORS;  Service: Gynecology;  Laterality: N/A;   MEDIAL PARTIAL KNEE REPLACEMENT Left 01/06/2014   PANNICULECTOMY N/A 02/05/2022   Procedure: PANNICULECTOMY;  Surgeon: Peggye Form, DO;  Location: [facility] OR;  Service: Plastics;  Laterality: N/A;  3 hours   UMBILICAL HERNIA REPAIR N/A 09/05/2015   Procedure: HERNIA REPAIR incarcerated UMBILICAL ADULT;  Surgeon: Natale Lay, MD;  Location: [facility] ORS;  Service: General;  Laterality: N/A;   Vascular Stent  11/04/2013    Prior to Admission medications   Medication Sig Start Date End Date Taking? Authorizing Provider  acetaminophen (TYLENOL) 650 MG CR tablet Take 650 mg by mouth every 8 (eight) hours as needed for pain.   Yes [provider]  Biotin w/ Vitamins C & E (HAIR/SKIN/NAILS PO) Take 1 tablet by mouth daily.   Yes [provider]  Brexpiprazole (REXULTI) 0.5 MG TABS Take 1 tablet (0.5 mg total) by mouth daily. 02/14/23  Yes Bacigalupo, Marzella Schlein, MD  buPROPion (WELLBUTRIN XL) 300 MG 24 hr tablet TAKE ONE TABLET BY MOUTH DAILY 06/07/23  Yes Bacigalupo, Marzella Schlein, MD  clotrimazole-betamethasone (LOTRISONE) cream Apply 1 Application topically 2 (two) times daily as needed.   Yes [provider]  DULoxetine (CYMBALTA) 60 MG capsule TAKE ONE CAPSULE BY MOUTH DAILY 03/01/23  Yes Bacigalupo, Marzella Schlein, MD  ezetimibe (ZETIA) 10 MG tablet Take 1 tablet (10 mg total) by mouth daily. 02/15/23  Yes Bacigalupo, Marzella Schlein, MD  GEMTESA 75 MG TABS Take 1 tablet (75 mg total) by mouth daily. 01/07/23 01/02/24 Yes MacDiarmid, Lorin Picket, MD  ketoconazole (NIZORAL) 2 % shampoo APPLY 1 APPLICATION TOPICALLY 2 (TWO) TIMES A WEEK. 04/18/23  Yes Bacigalupo, Marzella Schlein, MD  losartan (COZAAR) 25 MG tablet TAKE ONE TABLET BY MOUTH DAILY 06/07/23  Yes Bacigalupo, Marzella Schlein, MD  meloxicam (MOBIC) 15 MG tablet Take 15 mg by mouth daily. 02/15/23  Yes [provider]   metFORMIN (GLUCOPHAGE) 500 MG tablet TAKE ONE TABLET BY MOUTH TWICE DAILY 03/01/23  Yes Bacigalupo, Marzella Schlein, MD  oxybutynin (DITROPAN-XL) 10 MG 24 hr tablet Take 10 mg by mouth at bedtime.   Yes [provider]  polyethylene glycol (MIRALAX / GLYCOLAX) 17 g packet Take 17 g by mouth daily as needed for mild constipation. 03/06/22  Yes Osvaldo Shipper, MD  pregabalin (LYRICA) 75 MG capsule Take 1 capsule (75 mg total) by mouth 3 (three) times daily. 05/16/23  Yes Simmons-Robinson, Makiera, MD  tirzepatide [facility]) 5 MG/0.5ML Pen Inject 5 mg into the skin once a week. 02/14/23  Yes Bacigalupo, Marzella Schlein, MD  tolterodine (DETROL LA) 4 MG 24 hr capsule Take 1 capsule (4 mg total) by mouth daily. 01/07/23 01/02/24 Yes MacDiarmid, Lorin Picket, MD    Allergies as of 03/27/2023 - Review Complete 03/05/2023  Allergen Reaction Noted   Sulfa antibiotics Itching, Rash, and Swelling 07/06/2015   Latex Itching 07/06/2015   Doxycycline Rash 07/06/2015   Empagliflozin Itching and Other (See Comments) 11/25/2019    Family History  Problem Relation Age of Onset   Hypertension Mother    Cataracts Mother    Thyroid disease Mother    Dementia Mother    COPD Father    Dementia Father    Cataracts Father    Aneurysm Father        Abdominal & Brain   Diabetes Father    Melanoma Father    Breast cancer Sister 85   Fibromyalgia Sister    Migraines Sister    Hypertension Sister    Breast cancer Sister 25   COPD Sister    Ovarian cancer Sister 27   Migraines Brother    Heart attack Brother    Colon cancer Paternal Uncle        72s   Colon cancer Paternal Uncle        108s   Uterine cancer Cousin    Uterine cancer Cousin     Social History   Socioeconomic History   Marital status: Widowed    Spouse name: Onalee Hua   Number of children: 1   Years of education: 9th Grade   Highest education level: 9th grade  Occupational History   Occupation: Disabled    Comment: no longer receiving   Occupation:  on [employer]  Tobacco Use   Smoking status: Former    Current packs/day: 0.00    Average packs/day: 1 pack/day for 29.0 years (29.0 ttl pk-yrs)    Types: Cigarettes    Start date: 12/04/1967    Quit date: 12/03/1996    Years since quitting: 26.5    Passive exposure: Past   Smokeless tobacco: Never  Vaping Use   Vaping status: Never Used  Substance and Sexual Activity   Alcohol use: No   Drug use: No   Sexual activity: Not Currently  Other Topics Concern   Not on file  Social History Narrative   Not on file   Social Determinants of Health   Financial Resource Strain: Low Risk  (05/29/2023)   Overall Financial Resource Strain (CARDIA)    Difficulty of Paying Living Expenses: Not very hard  Food Insecurity: No Food Insecurity (05/29/2023)   Hunger Vital Sign    Worried About Running Out of Food in the Last Year: Never true    Ran Out of Food in the Last Year: Never true  Transportation Needs: No Transportation Needs (05/29/2023)   PRAPARE - Administrator, Civil Service (Medical): No    Lack of Transportation (Non-Medical): No  Physical Activity: Inactive (05/29/2023)   Exercise Vital Sign    Days of Exercise per Week: 0 days    Minutes of Exercise per Session: 0 min  Stress: Stress Concern Present (05/29/2023)   [employer] of Occupational Health - Occupational Stress Questionnaire    Feeling of Stress : To some extent  Social Connections: Socially Isolated (05/29/2023)   Social Connection and Isolation Panel [NHANES]    Frequency of Communication with Friends and Family: More than three times a week    Frequency of Social Gatherings with Friends and Family: More than three times a week    Attends Religious Services: Never    Database administrator or Organizations: No    Attends Banker Meetings: Never    Marital Status: Widowed  Intimate Partner Violence: Not At Risk (05/29/2023)   Humiliation, Afraid, Rape, and Kick questionnaire    Fear of Current or  Ex-Partner: No    Emotionally Abused: No    Physically Abused: No    Sexually Abused: No    Review of Systems: See HPI, otherwise negative ROS  Physical Exam: BP 122/63   Pulse 74   Temp 97.7 F (36.5 C) (Temporal)   Resp 18   Ht 5\' 8"  (1.727 m)   Wt 114.2 kg   LMP  (LMP Unknown) Comment: age 55  SpO2 97%   BMI 38.27 kg/m  General:   Alert, cooperative in NAD Head:  Normocephalic and atraumatic. Respiratory:  Normal work of breathing. Cardiovascular:  RRR  Impression/Plan: [name] [name] is here for cataract surgery.  Risks, benefits, limitations, and alternatives regarding cataract surgery have been reviewed with the patient.  Questions have been answered.  All parties agreeable.   Estanislado Pandy, MD  06/13/2023, 7:05 AM

## 2023-06-17 DIAGNOSIS — M25612 Stiffness of left shoulder, not elsewhere classified: Secondary | ICD-10-CM | POA: Diagnosis not present

## 2023-06-17 DIAGNOSIS — M25512 Pain in left shoulder: Secondary | ICD-10-CM | POA: Diagnosis not present

## 2023-06-19 DIAGNOSIS — M25612 Stiffness of left shoulder, not elsewhere classified: Secondary | ICD-10-CM | POA: Diagnosis not present

## 2023-06-19 DIAGNOSIS — M25512 Pain in left shoulder: Secondary | ICD-10-CM | POA: Diagnosis not present

## 2023-06-20 ENCOUNTER — Encounter: Payer: Self-pay | Admitting: Pharmacist

## 2023-06-20 NOTE — Progress Notes (Signed)
Patient previously followed by [facility] pharmacist. Patient being outreached by [facility] pharmacy team for adherence.

## 2023-06-21 ENCOUNTER — Encounter: Payer: Self-pay | Admitting: Ophthalmology

## 2023-06-21 DIAGNOSIS — H2511 Age-related nuclear cataract, right eye: Secondary | ICD-10-CM | POA: Diagnosis not present

## 2023-06-25 DIAGNOSIS — M25512 Pain in left shoulder: Secondary | ICD-10-CM | POA: Diagnosis not present

## 2023-06-25 DIAGNOSIS — M25612 Stiffness of left shoulder, not elsewhere classified: Secondary | ICD-10-CM | POA: Diagnosis not present

## 2023-06-25 NOTE — Discharge Instructions (Addendum)

## 2023-06-27 ENCOUNTER — Ambulatory Visit
Admission: RE | Admit: 2023-06-27 | Discharge: 2023-06-27 | Disposition: A | Discharge status: Home / Self Care | Payer: 59 | Attending: Ophthalmology | Admitting: Ophthalmology

## 2023-06-27 ENCOUNTER — Encounter: Payer: Self-pay | Admitting: Ophthalmology

## 2023-06-27 ENCOUNTER — Encounter
Admission: RE | Disposition: A | Discharge status: Home / Self Care | Payer: Self-pay | Source: Home / Self Care | Attending: Ophthalmology

## 2023-06-27 ENCOUNTER — Ambulatory Visit: Payer: 59 | Admitting: Anesthesiology

## 2023-06-27 ENCOUNTER — Other Ambulatory Visit: Payer: Self-pay

## 2023-06-27 DIAGNOSIS — I509 Heart failure, unspecified: Secondary | ICD-10-CM | POA: Insufficient documentation

## 2023-06-27 DIAGNOSIS — Z7984 Long term (current) use of oral hypoglycemic drugs: Secondary | ICD-10-CM | POA: Diagnosis not present

## 2023-06-27 DIAGNOSIS — H2511 Age-related nuclear cataract, right eye: Secondary | ICD-10-CM | POA: Insufficient documentation

## 2023-06-27 DIAGNOSIS — I5022 Chronic systolic (congestive) heart failure: Secondary | ICD-10-CM | POA: Diagnosis not present

## 2023-06-27 DIAGNOSIS — Z8249 Family history of ischemic heart disease and other diseases of the circulatory system: Secondary | ICD-10-CM | POA: Insufficient documentation

## 2023-06-27 DIAGNOSIS — E1136 Type 2 diabetes mellitus with diabetic cataract: Secondary | ICD-10-CM | POA: Diagnosis not present

## 2023-06-27 DIAGNOSIS — Z833 Family history of diabetes mellitus: Secondary | ICD-10-CM | POA: Insufficient documentation

## 2023-06-27 DIAGNOSIS — I110 Hypertensive heart disease with heart failure: Secondary | ICD-10-CM | POA: Insufficient documentation

## 2023-06-27 DIAGNOSIS — Z87891 Personal history of nicotine dependence: Secondary | ICD-10-CM | POA: Diagnosis not present

## 2023-06-27 HISTORY — PX: CATARACT EXTRACTION W/PHACO: SHX586

## 2023-06-27 LAB — GLUCOSE, CAPILLARY: Glucose-Capillary: 145 mg/dL — ABNORMAL HIGH (ref 70–99)

## 2023-06-27 SURGERY — PHACOEMULSIFICATION, CATARACT, WITH IOL INSERTION
Anesthesia: Monitor Anesthesia Care | Laterality: Right

## 2023-06-27 MED ORDER — FENTANYL CITRATE (PF) 100 MCG/2ML IJ SOLN
INTRAMUSCULAR | Status: DC | PRN
  Administered 2023-06-27: 25 ug via INTRAVENOUS
  Administered 2023-06-27: 75 ug via INTRAVENOUS

## 2023-06-27 MED ORDER — LIDOCAINE HCL (PF) 2 % IJ SOLN
INTRAOCULAR | Status: DC | PRN
  Administered 2023-06-27: 4 mL via INTRAOCULAR

## 2023-06-27 MED ORDER — MOXIFLOXACIN HCL 0.5 % OP SOLN
OPHTHALMIC | Status: DC | PRN
  Administered 2023-06-27: .2 mL via OPHTHALMIC

## 2023-06-27 MED ORDER — TETRACAINE HCL 0.5 % OP SOLN
1.0000 [drp] | OPHTHALMIC | Status: DC | PRN
  Administered 2023-06-27 (×3): 1 [drp] via OPHTHALMIC

## 2023-06-27 MED ORDER — BRIMONIDINE TARTRATE-TIMOLOL 0.2-0.5 % OP SOLN
OPHTHALMIC | Status: DC | PRN
  Administered 2023-06-27: 1 [drp] via OPHTHALMIC

## 2023-06-27 MED ORDER — ARMC OPHTHALMIC DILATING DROPS
1.0000 | OPHTHALMIC | Status: DC | PRN
  Administered 2023-06-27 (×3): 1 via OPHTHALMIC

## 2023-06-27 MED ORDER — SODIUM CHLORIDE 0.9% FLUSH
INTRAVENOUS | Status: DC | PRN
  Administered 2023-06-27 (×2): 10 mL via INTRAVENOUS

## 2023-06-27 MED ORDER — SIGHTPATH DOSE#1 NA HYALUR & NA CHOND-NA HYALUR IO KIT
PACK | INTRAOCULAR | Status: DC | PRN
  Administered 2023-06-27: 1 via OPHTHALMIC

## 2023-06-27 MED ORDER — MIDAZOLAM HCL 2 MG/2ML IJ SOLN
INTRAMUSCULAR | Status: DC | PRN
  Administered 2023-06-27: 2 mg via INTRAVENOUS
  Administered 2023-06-27: 1 mg via INTRAVENOUS

## 2023-06-27 MED ORDER — SIGHTPATH DOSE#1 BSS IO SOLN
INTRAOCULAR | Status: DC | PRN
  Administered 2023-06-27: 52 mL via OPHTHALMIC

## 2023-06-27 MED ORDER — SIGHTPATH DOSE#1 BSS IO SOLN
INTRAOCULAR | Status: DC | PRN
  Administered 2023-06-27: 15 mL via INTRAOCULAR

## 2023-06-27 MED ORDER — LACTATED RINGERS IV SOLN: INTRAVENOUS | Status: DC

## 2023-06-27 SURGICAL SUPPLY — 9 items
CATARACT SUITE SIGHTPATH (MISCELLANEOUS) ×1 IMPLANT
DISSECTOR HYDRO NUCLEUS 50X22 (MISCELLANEOUS) ×1 IMPLANT
DRSG TEGADERM 2-3/8X2-3/4 SM (GAUZE/BANDAGES/DRESSINGS) ×1 IMPLANT
FEE CATARACT SUITE SIGHTPATH (MISCELLANEOUS) ×1 IMPLANT
GLOVE SURG SYN 7.5 E (GLOVE) ×1 IMPLANT
GLOVE SURG SYN 7.5 PF PI (GLOVE) ×1 IMPLANT
GLOVE SURG SYN 8.5 E (GLOVE) ×1 IMPLANT
GLOVE SURG SYN 8.5 PF PI (GLOVE) ×1 IMPLANT
LENS IOL TECNIS EYHANCE 26.0 (Intraocular Lens) IMPLANT

## 2023-06-27 NOTE — Transfer of Care (Signed)
Immediate Anesthesia Transfer of Care Note  Patient: [name] [name]  Procedure(s) Performed: CATARACT EXTRACTION PHACO AND INTRAOCULAR LENS PLACEMENT (IOC) RIGHT DIABETIC 4.21 00.39.8 (Right)  Patient Location: PACU  Anesthesia Type:MAC  Level of Consciousness: awake, alert , and oriented  Airway & Oxygen Therapy: Patient Spontanous Breathing  Post-op Assessment: Report given to RN and Post -op Vital signs reviewed and stable  Post vital signs: Reviewed and stable  Last Vitals: See PACU flow sheet.  All V/S WNL  Vitals Value Taken Time  BP 119/72 06/27/23 1225  Temp    Pulse 74 06/27/23 1227  Resp 15 06/27/23 1227  SpO2 96 % 06/27/23 1227  Vitals shown include unfiled device data.  Last Pain:  Vitals:   06/27/23 1059  TempSrc: Temporal  PainSc: 0-No pain         Complications: No notable events documented.

## 2023-06-27 NOTE — Op Note (Signed)
OPERATIVE NOTE  [name] [name] [id number] 06/27/2023   PREOPERATIVE DIAGNOSIS: Nuclear sclerotic cataract right eye. H25.11   POSTOPERATIVE DIAGNOSIS: Nuclear sclerotic cataract right eye. H25.11   PROCEDURE:  Phacoemusification with posterior chamber intraocular lens placement of the right eye  Ultrasound time: Procedure(s): CATARACT EXTRACTION PHACO AND INTRAOCULAR LENS PLACEMENT (IOC) RIGHT DIABETIC 4.21 00.39.8 (Right)  LENS:   Implant Name Type Inv. Item Serial No. Manufacturer Lot No. LRB No. Used Action  LENS IOL TECNIS EYHANCE 26.0 - [id number] Intraocular Lens LENS IOL TECNIS EYHANCE 26.0 [id number] [employer]  Right 1 Implanted      SURGEON:  Julious Payer. Rolley Sims, MD   ANESTHESIA:  Topical with tetracaine drops, augmented with 1% preservative-free intracameral lidocaine.   COMPLICATIONS:  None.   DESCRIPTION OF PROCEDURE:  The patient was identified in the holding room and transported to the operating room and placed in the supine position under the operating microscope.  The right eye was identified as the operative eye, which was prepped and draped in the usual sterile ophthalmic fashion.   A 1 millimeter clear-corneal paracentesis was made superotemporally. Preservative-free 1% lidocaine mixed with 1:1,000 bisulfite-free aqueous solution of epinephrine was injected into the anterior chamber. The anterior chamber was then filled with Viscoat viscoelastic. A 2.4 millimeter keratome was used to make a clear-corneal incision inferotemporally. A curvilinear capsulorrhexis was made with a cystotome and capsulorrhexis forceps. Balanced salt solution was used to hydrodissect and hydrodelineate the nucleus. Phacoemulsification was then used to remove the lens nucleus and epinucleus. The remaining cortex was then removed using the irrigation and aspiration handpiece. Provisc was then placed into the capsular bag to distend it for lens placement. A +26.00 D DIB00 intraocular lens was then  injected into the capsular bag. The remaining viscoelastic was aspirated.   Wounds were hydrated with balanced salt solution.  The anterior chamber was inflated to a physiologic pressure with balanced salt solution.  No wound leaks were noted. Vigamox was injected intracamerally.  Timolol and Brimonidine drops were applied to the eye.  The patient was taken to the recovery room in stable condition without complications of anesthesia or surgery.  [name] 06/27/2023, 12:24 PM

## 2023-06-27 NOTE — Anesthesia Postprocedure Evaluation (Signed)
Anesthesia Post Note  Patient: [name] [name]  Procedure(s) Performed: CATARACT EXTRACTION PHACO AND INTRAOCULAR LENS PLACEMENT (IOC) RIGHT DIABETIC 4.21 00.39.8 (Right)  Patient location during evaluation: PACU Anesthesia Type: MAC Level of consciousness: awake and alert Pain management: pain level controlled Vital Signs Assessment: post-procedure vital signs reviewed and stable Respiratory status: spontaneous breathing, nonlabored ventilation, respiratory function stable and patient connected to nasal cannula oxygen Cardiovascular status: blood pressure returned to baseline and stable Postop Assessment: no apparent nausea or vomiting Anesthetic complications: no  No notable events documented.   Last Vitals:  Vitals:   06/27/23 1226 06/27/23 1230  BP: 119/72 112/62  Pulse: 74 77  Resp: (!) 8 (!) 21  Temp: 36.5 C 36.5 C  SpO2: 93% 95%    Last Pain:  Vitals:   06/27/23 1230  TempSrc:   PainSc: 0-No pain                 Stephanie Coup

## 2023-06-27 NOTE — Anesthesia Preprocedure Evaluation (Signed)
Anesthesia Evaluation  Patient identified by MRN, date of birth, ID band Patient awake    Reviewed: Allergy & Precautions, H&P , NPO status , Patient's Chart, lab work & pertinent test results  Airway Mallampati: [name]  TM Distance: >3 FB Neck ROM: Full    Dental  (+) Partial Upper   Pulmonary former smoker   Pulmonary exam normal breath sounds clear to auscultation       Cardiovascular hypertension, + CAD, + Peripheral Vascular Disease and +CHF  Normal cardiovascular exam Rhythm:Regular Rate:Normal  09-05-15 Procedure narrative: Transthoracic echocardiography. Image    quality was suboptimal. The study was technically difficult.  - Left ventricle: The cavity size was moderately dilated. Systolic    function was severely reduced. The estimated ejection fraction    was in the range of 25% to 30%. Although no diagnostic regional    wall motion abnormality was identified, this possibility cannot    be completely excluded on the basis of this study.  - Mitral valve: There was mild regurgitation.  - Left atrium: The atrium was mildly dilated.  - Right atrium: The atrium was mildly dilated.   Chronic systolic CHF NYHA Class [name]    Neuro/Psych  Headaches PSYCHIATRIC DISORDERS Anxiety Depression     Neuromuscular disease    GI/Hepatic Neg liver ROS, PUD,,,  Endo/Other  diabetes    Renal/GU negative Renal ROS  negative genitourinary   Musculoskeletal  (+) Arthritis ,  Fibromyalgia -  Abdominal   Peds negative pediatric ROS (+)  Hematology  (+) Blood dyscrasia, anemia   Anesthesia Other Findings Fibromyalgia affecting shoulder region Diabetes mellitus without complication  Cellulitis of both lower extremities Coronary artery disease Fibromyalgia  Chronic systolic CHF  cardiomyopathy Spinal stenosis  Arthritis Hypertension  Hyperlipidemia Cancer (HCC) Anxiety  Depression Headache  Torn rotator  cuff COVID-19  Wears dentures    Reproductive/Obstetrics negative OB ROS                             Anesthesia Physical Anesthesia Plan  ASA: 3  Anesthesia Plan: MAC   Post-op Pain Management:    Induction: Intravenous  PONV Risk Score and Plan:   Airway Management Planned: Natural Airway and Nasal Cannula  Additional Equipment:   Intra-op Plan:   Post-operative Plan:   Informed Consent: I have reviewed the patients History and Physical, chart, labs and discussed the procedure including the risks, benefits and alternatives for the proposed anesthesia with the patient or authorized representative who has indicated his/her understanding and acceptance.     Dental Advisory Given  Plan Discussed with: Anesthesiologist, CRNA and Surgeon  Anesthesia Plan Comments: (Patient consented for risks of anesthesia including but not limited to:  - adverse reactions to medications - damage to eyes, teeth, lips or other oral mucosa - nerve damage due to positioning  - sore throat or hoarseness - Damage to heart, brain, nerves, lungs, other parts of body or loss of life  Patient voiced understanding.)        Anesthesia Quick Evaluation

## 2023-06-27 NOTE — H&P (Signed)
[facility]   Primary Care Physician:  Erasmo Downer, MD Ophthalmologist: Dr. Deberah Pelton  Pre-Procedure History & Physical: HPI:  [name] [name] is a 72 y.o. female here for cataract surgery.   Past Medical History:  Diagnosis Date   Anxiety    Arthritis    Rhumetoid arthritis   BRCA negative 07/2017   MyRisk neg   Cancer (HCC)    uterine   Cellulitis of both lower extremities    Chronic   CHF (congestive heart failure) (HCC)    Coronary artery disease    COVID-19 11/2020   Depression    Diabetes mellitus without complication (HCC)    Family history of breast cancer 07/2017   MyRisk neg; IBIS=10.5%/riskscore=17%   Family history of ovarian cancer    Fibromyalgia    Fibromyalgia affecting shoulder region    Headache    Hyperlipidemia    Hypertension    Spinal stenosis    Torn rotator cuff    bilateral   Wears dentures    full upper    Past Surgical History:  Procedure Laterality Date   ABLATION SAPHENOUS VEIN W/ RFA     ANAL FISSURECTOMY  01/2004   CATARACT EXTRACTION W/PHACO Left 06/13/2023   Procedure: CATARACT EXTRACTION PHACO AND INTRAOCULAR LENS PLACEMENT (IOC) LEFT DIABETIC  5.79  00:42.7;  Surgeon: Estanislado Pandy, MD;  Location: [facility];  Service: Ophthalmology;  Laterality: Left;   COLON SURGERY     CORONARY ANGIOPLASTY     CYSTOSCOPY  09/03/2017   Procedure: CYSTOSCOPY;  Surgeon: Nadara Mustard, MD;  Location: [facility];  Service: Gynecology;;   DILATION AND CURETTAGE OF UTERUS     ESOPHAGEAL DILATION  02/20/2022   Procedure: ESOPHAGEAL DILATION;  Surgeon: Jeani Hawking, MD;  Location: [facility];  Service: Gastroenterology;;  savory   ESOPHAGOGASTRODUODENOSCOPY (EGD) WITH PROPOFOL N/A 02/20/2022   Procedure: ESOPHAGOGASTRODUODENOSCOPY (EGD) WITH PROPOFOL;  Surgeon: Jeani Hawking, MD;  Location: [facility];  Service: Gastroenterology;  Laterality: N/A;  Dyspahgia   EYE SURGERY Bilateral 08/2020   eye lift   HERNIA  REPAIR     INCISION AND DRAINAGE OF WOUND  03/04/2022   Procedure: IRRIGATION AND DEBRIDEMENT ABDOMEN WITH VAC PLACEMENT, MYRIAD PLACEMENT;  Surgeon: Peggye Form, DO;  Location: [facility];  Service: Plastics;;   LAPAROSCOPIC HYSTERECTOMY N/A 09/03/2017   Procedure: HYSTERECTOMY TOTAL LAPAROSCOPIC BSO;  Surgeon: Nadara Mustard, MD;  Location: [facility];  Service: Gynecology;  Laterality: N/A;   MEDIAL PARTIAL KNEE REPLACEMENT Left 01/06/2014   PANNICULECTOMY N/A 02/05/2022   Procedure: PANNICULECTOMY;  Surgeon: Peggye Form, DO;  Location: [facility] OR;  Service: Plastics;  Laterality: N/A;  3 hours   UMBILICAL HERNIA REPAIR N/A 09/05/2015   Procedure: HERNIA REPAIR incarcerated UMBILICAL ADULT;  Surgeon: Natale Lay, MD;  Location: [facility];  Service: General;  Laterality: N/A;   Vascular Stent  11/04/2013    Prior to Admission medications   Medication Sig Start Date End Date Taking? Authorizing Provider  acetaminophen (TYLENOL) 650 MG CR tablet Take 650 mg by mouth every 8 (eight) hours as needed for pain.   Yes [provider]  Biotin w/ Vitamins C & E (HAIR/SKIN/NAILS PO) Take 1 tablet by mouth daily.   Yes [provider]  Brexpiprazole (REXULTI) 0.5 MG TABS Take 1 tablet (0.5 mg total) by mouth daily. 02/14/23  Yes Bacigalupo, Marzella Schlein, MD  buPROPion (WELLBUTRIN XL) 300 MG 24 hr tablet TAKE ONE TABLET BY MOUTH  DAILY 06/07/23  Yes Bacigalupo, Marzella Schlein, MD  clotrimazole-betamethasone (LOTRISONE) cream Apply 1 Application topically 2 (two) times daily as needed.   Yes [provider]  DULoxetine (CYMBALTA) 60 MG capsule TAKE ONE CAPSULE BY MOUTH DAILY 03/01/23  Yes Bacigalupo, Marzella Schlein, MD  ezetimibe (ZETIA) 10 MG tablet Take 1 tablet (10 mg total) by mouth daily. 02/15/23  Yes Bacigalupo, Marzella Schlein, MD  GEMTESA 75 MG TABS Take 1 tablet (75 mg total) by mouth daily. 01/07/23 01/02/24 Yes MacDiarmid, Lorin Picket, MD  losartan (COZAAR) 25 MG tablet TAKE ONE TABLET BY MOUTH DAILY  06/07/23  Yes Bacigalupo, Marzella Schlein, MD  meloxicam (MOBIC) 15 MG tablet Take 15 mg by mouth daily. 02/15/23  Yes [provider]  metFORMIN (GLUCOPHAGE) 500 MG tablet TAKE ONE TABLET BY MOUTH TWICE DAILY 03/01/23  Yes Bacigalupo, Marzella Schlein, MD  oxybutynin (DITROPAN-XL) 10 MG 24 hr tablet Take 10 mg by mouth at bedtime.   Yes [provider]  polyethylene glycol (MIRALAX / GLYCOLAX) 17 g packet Take 17 g by mouth daily as needed for mild constipation. 03/06/22  Yes Osvaldo Shipper, MD  pregabalin (LYRICA) 75 MG capsule Take 1 capsule (75 mg total) by mouth 3 (three) times daily. 05/16/23  Yes Simmons-Robinson, Makiera, MD  tirzepatide [facility]) 5 MG/0.5ML Pen Inject 5 mg into the skin once a week. 02/14/23  Yes Bacigalupo, Marzella Schlein, MD  tolterodine (DETROL LA) 4 MG 24 hr capsule Take 1 capsule (4 mg total) by mouth daily. 01/07/23 01/02/24 Yes MacDiarmid, Lorin Picket, MD  triamcinolone ointment (KENALOG) 0.5 % Apply 1 Application topically 2 (two) times daily. 06/13/23  Yes Bacigalupo, Marzella Schlein, MD  ketoconazole (NIZORAL) 2 % shampoo APPLY 1 APPLICATION TOPICALLY 2 (TWO) TIMES A WEEK. 04/18/23   Erasmo Downer, MD    Allergies as of 03/27/2023 - Review Complete 03/05/2023  Allergen Reaction Noted   Sulfa antibiotics Itching, Rash, and Swelling 07/06/2015   Latex Itching 07/06/2015   Doxycycline Rash 07/06/2015   Empagliflozin Itching and Other (See Comments) 11/25/2019    Family History  Problem Relation Age of Onset   Hypertension Mother    Cataracts Mother    Thyroid disease Mother    Dementia Mother    COPD Father    Dementia Father    Cataracts Father    Aneurysm Father        Abdominal & Brain   Diabetes Father    Melanoma Father    Breast cancer Sister 56   Fibromyalgia Sister    Migraines Sister    Hypertension Sister    Breast cancer Sister 60   COPD Sister    Ovarian cancer Sister 70   Migraines Brother    Heart attack Brother    Colon cancer Paternal Uncle         51s   Colon cancer Paternal Uncle        79s   Uterine cancer Cousin    Uterine cancer Cousin     Social History   Socioeconomic History   Marital status: Widowed    Spouse name: Onalee Hua   Number of children: 1   Years of education: 9th Grade   Highest education level: 9th grade  Occupational History   Occupation: Disabled    Comment: no longer receiving   Occupation: on SS  Tobacco Use   Smoking status: Former    Current packs/day: 0.00    Average packs/day: 1 pack/day for 29.0 years (29.0 ttl pk-yrs)  Types: Cigarettes    Start date: 12/04/1967    Quit date: 12/03/1996    Years since quitting: 26.5    Passive exposure: Past   Smokeless tobacco: Never  Vaping Use   Vaping status: Never Used  Substance and Sexual Activity   Alcohol use: No   Drug use: No   Sexual activity: Not Currently  Other Topics Concern   Not on file  Social History Narrative   Not on file   Social Determinants of Health   Financial Resource Strain: Low Risk  (05/29/2023)   Overall Financial Resource Strain (CARDIA)    Difficulty of Paying Living Expenses: Not very hard  Food Insecurity: No Food Insecurity (05/29/2023)   Hunger Vital Sign    Worried About Running Out of Food in the Last Year: Never true    Ran Out of Food in the Last Year: Never true  Transportation Needs: No Transportation Needs (05/29/2023)   PRAPARE - Administrator, Civil Service (Medical): No    Lack of Transportation (Non-Medical): No  Physical Activity: Inactive (05/29/2023)   Exercise Vital Sign    Days of Exercise per Week: 0 days    Minutes of Exercise per Session: 0 min  Stress: Stress Concern Present (05/29/2023)   [employer] of Occupational Health - Occupational Stress Questionnaire    Feeling of Stress : To some extent  Social Connections: Socially Isolated (05/29/2023)   Social Connection and Isolation Panel [NHANES]    Frequency of Communication with Friends and Family: More than three times  a week    Frequency of Social Gatherings with Friends and Family: More than three times a week    Attends Religious Services: Never    Database administrator or Organizations: No    Attends Banker Meetings: Never    Marital Status: Widowed  Intimate Partner Violence: Not At Risk (05/29/2023)   Humiliation, Afraid, Rape, and Kick questionnaire    Fear of Current or Ex-Partner: No    Emotionally Abused: No    Physically Abused: No    Sexually Abused: No    Review of Systems: See HPI, otherwise negative ROS  Physical Exam: BP 133/85   Pulse 90   Temp 97.9 F (36.6 C) (Temporal)   Ht 5\' 8"  (1.727 m)   Wt 113.9 kg   LMP  (LMP Unknown) Comment: age 56  SpO2 95%   BMI 38.16 kg/m  General:   Alert, cooperative in NAD Head:  Normocephalic and atraumatic. Respiratory:  Normal work of breathing. Cardiovascular:  RRR  Impression/Plan: [name] [name] is here for cataract surgery.  Risks, benefits, limitations, and alternatives regarding cataract surgery have been reviewed with the patient.  Questions have been answered.  All parties agreeable.   Estanislado Pandy, MD  06/27/2023, 11:44 AM

## 2023-06-28 ENCOUNTER — Encounter: Payer: Self-pay | Admitting: Ophthalmology

## 2023-07-01 ENCOUNTER — Encounter: Payer: Self-pay | Admitting: Family Medicine

## 2023-07-01 ENCOUNTER — Ambulatory Visit (INDEPENDENT_AMBULATORY_CARE_PROVIDER_SITE_OTHER): Payer: 59 | Admitting: Family Medicine

## 2023-07-01 VITALS — BP 126/70 | HR 72 | Temp 98.2°F | Resp 16 | Ht 68.0 in | Wt 252.8 lb

## 2023-07-01 DIAGNOSIS — E1165 Type 2 diabetes mellitus with hyperglycemia: Secondary | ICD-10-CM

## 2023-07-01 DIAGNOSIS — Z0000 Encounter for general adult medical examination without abnormal findings: Secondary | ICD-10-CM

## 2023-07-01 DIAGNOSIS — G720 Drug-induced myopathy: Secondary | ICD-10-CM | POA: Diagnosis not present

## 2023-07-01 DIAGNOSIS — F332 Major depressive disorder, recurrent severe without psychotic features: Secondary | ICD-10-CM | POA: Insufficient documentation

## 2023-07-01 DIAGNOSIS — M7022 Olecranon bursitis, left elbow: Secondary | ICD-10-CM | POA: Diagnosis not present

## 2023-07-01 DIAGNOSIS — E1159 Type 2 diabetes mellitus with other circulatory complications: Secondary | ICD-10-CM | POA: Diagnosis not present

## 2023-07-01 DIAGNOSIS — D692 Other nonthrombocytopenic purpura: Secondary | ICD-10-CM

## 2023-07-01 DIAGNOSIS — E1169 Type 2 diabetes mellitus with other specified complication: Secondary | ICD-10-CM | POA: Diagnosis not present

## 2023-07-01 DIAGNOSIS — D509 Iron deficiency anemia, unspecified: Secondary | ICD-10-CM

## 2023-07-01 DIAGNOSIS — I5022 Chronic systolic (congestive) heart failure: Secondary | ICD-10-CM

## 2023-07-01 DIAGNOSIS — I152 Hypertension secondary to endocrine disorders: Secondary | ICD-10-CM | POA: Diagnosis not present

## 2023-07-01 DIAGNOSIS — G63 Polyneuropathy in diseases classified elsewhere: Secondary | ICD-10-CM | POA: Diagnosis not present

## 2023-07-01 DIAGNOSIS — M0579 Rheumatoid arthritis with rheumatoid factor of multiple sites without organ or systems involvement: Secondary | ICD-10-CM | POA: Diagnosis not present

## 2023-07-01 DIAGNOSIS — E785 Hyperlipidemia, unspecified: Secondary | ICD-10-CM | POA: Diagnosis not present

## 2023-07-01 MED ORDER — FREESTYLE LIBRE 3 SENSOR MISC: 11 refills | Status: DC

## 2023-07-01 MED ORDER — HYDROCORTISONE 0.5 % EX CREA: 1.0000 | TOPICAL_CREAM | Freq: Two times a day (BID) | CUTANEOUS | 1 refills | Status: DC

## 2023-07-01 NOTE — Assessment & Plan Note (Signed)
Acute and uncontrolled. Is having a flare of eczema on her face. It is bothering her as it feels like rough sandpaper. She is using OTC facewash and a cream. Is wondering if we could give her management recs Topical ..Marland KitchenMarland Kitchen

## 2023-07-01 NOTE — Assessment & Plan Note (Signed)
Chronic and stable  CTM

## 2023-07-01 NOTE — Assessment & Plan Note (Signed)
Chronic and stable. Followed by cardiology. CTM

## 2023-07-01 NOTE — Assessment & Plan Note (Signed)
Contraindication to statins.

## 2023-07-01 NOTE — Assessment & Plan Note (Signed)
Chronic and stable. BP was well controlled in office today at 126/70. CTM

## 2023-07-01 NOTE — Assessment & Plan Note (Signed)
Stable. Recently had surgery on her L rotator cuff. Is followed by orthopedics who will manage her bursitis once fully healed.  CTM

## 2023-07-01 NOTE — Assessment & Plan Note (Signed)
Not well controlled. Lipid panel collected in march was not wnl. 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 Has statin intolerance. Continue Zetia  Lipid panel

## 2023-07-01 NOTE — Progress Notes (Signed)
Complete physical exam  Patient: [name] [name]   DOB: 1951-08-08   72 y.o. Female  MRN: [id number]  Subjective:    Chief Complaint  Patient presents with   Annual Exam    [name] [name] is a 72 y.o. female who presents today for a complete physical exam. She reports consuming a general diet. Exercise is limited by orthopedic condition(s): rotator cuff injuries. She generally feels well. She reports sleeping well. She does have additional problems to discuss today.    Most recent fall risk assessment:    05/29/2023    8:57 AM  Fall Risk   Falls in the past year? 1  Number falls in past yr: 0  Injury with Fall? 0  Risk for fall due to : No Fall Risks  Follow up Education provided;Falls prevention discussed     Most recent depression screenings:    07/01/2023    1:58 PM 05/29/2023    9:06 AM  PHQ 2/9 Scores  PHQ - 2 Score 0 0  PHQ- 9 Score 1     Patient Active Problem List   Diagnosis Date Noted   Olecranon bursitis of left elbow 03/05/2023   GAD (generalized anxiety disorder) 01/29/2023   Memory changes 01/14/2023   Myoclonus 01/14/2023   Eczema 01/14/2023   Seborrheic dermatitis of scalp 01/14/2023   Acute pain of right knee 07/17/2022   Passive suicidal ideations 05/31/2022   Financial insecurity 05/31/2022   Wound infection 03/13/2022   Normocytic anemia 03/03/2022   Abdominal wall cellulitis 03/03/2022   Wound dehiscence 03/03/2022   Stricture esophagus 02/21/2022   Hyponatremia 02/15/2022   Hypoalbuminemia 02/15/2022   Post op infection 02/15/2022    Class: Question of   Anemia associated with acute blood loss 02/15/2022   Hypokalemia 02/15/2022   Right arm pain 10/19/2021   Housing instability 03/28/2021   Dermatochalasis of both upper eyelids 03/23/2021   Exposure keratopathy, 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   Ptosis of both eyebrows 03/23/2021   Ptosis of both upper eyelids 03/23/2021   Senile ectropion of both lower eyelids 03/23/2021   Senile purpura (HCC)  03/07/2021   Panniculitis 02/03/2021   Myalgia due to statin 12/26/2020   Telogen effluvium 12/26/2020   Candidal intertrigo 12/06/2020   Microcytic anemia 12/06/2020   Polyneuropathy associated with underlying disease (HCC) 02/03/2020   Lump in chest 12/28/2019   Mixed stress and urge urinary incontinence 08/21/2019   Avitaminosis D 07/15/2019   Drug-induced constipation 06/12/2019   Lumbar spondylosis 07/08/2018   Chronic bilateral low back pain with bilateral sciatica 07/08/2018   Chronic pain syndrome 07/08/2018   Urinary incontinence 03/19/2018   Complex endometrial hyperplasia with atypia 09/03/2017   Atypical endometrial hyperplasia 08/15/2017   Bilateral shoulder pain 07/18/2017   Lymphedema 01/31/2017   Hyperlipidemia associated with type 2 diabetes mellitus (HCC) 12/14/2016   Hypertension associated with diabetes (HCC) 12/14/2016   Chronic systolic CHF (congestive heart failure), NYHA class 3 (HCC) 10/06/2015   History of MI (myocardial infarction) 09/20/2015   Allergic rhinitis 07/06/2015   Chronic venous insufficiency 07/06/2015   Atherosclerosis of coronary artery 07/06/2015   MDD (major depressive disorder) 07/06/2015   Fibromyalgia 07/06/2015   Rheumatoid arthritis involving multiple sites with positive rheumatoid factor (HCC) 07/06/2015   Type 2 diabetes mellitus with complication, without long-term current use of insulin (HCC) 04/08/2014   Migraines 04/08/2014   Gastroduodenal ulcer 04/08/2014   Venous stasis 04/08/2014   Osteoarthritis 04/08/2014   Coronary artery disease 04/08/2014   Morbid  obesity (HCC) 04/08/2014   Peptic ulcer disease 04/08/2014   Obesity 04/08/2014   Seropositive rheumatoid arthritis (HCC) 04/06/2014      Patient Care Team: Erasmo Downer, MD as PCP - General (Family Medicine) Alfredo Martinez, MD as Consulting Physician (Urology) Floyce Stakes, OD (Optometry) Dillingham, Alena Bills, DO as Attending Physician (Plastic  Surgery) Lamar Blinks, MD as Consulting Physician (Cardiology) Lamar Blinks, MD as Consulting Physician (Cardiology)   Outpatient Medications Prior to Visit  Medication Sig   acetaminophen (TYLENOL) 650 MG CR tablet Take 650 mg by mouth every 8 (eight) hours as needed for pain.   Biotin w/ Vitamins C & E (HAIR/SKIN/NAILS PO) Take 1 tablet by mouth daily.   Brexpiprazole (REXULTI) 0.5 MG TABS Take 1 tablet (0.5 mg total) by mouth daily.   buPROPion (WELLBUTRIN XL) 300 MG 24 hr tablet TAKE ONE TABLET BY MOUTH DAILY   clotrimazole-betamethasone (LOTRISONE) cream Apply 1 Application topically 2 (two) times daily as needed.   DULoxetine (CYMBALTA) 60 MG capsule TAKE ONE CAPSULE BY MOUTH DAILY   ezetimibe (ZETIA) 10 MG tablet Take 1 tablet (10 mg total) by mouth daily.   GEMTESA 75 MG TABS Take 1 tablet (75 mg total) by mouth daily.   ketoconazole (NIZORAL) 2 % shampoo APPLY 1 APPLICATION TOPICALLY 2 (TWO) TIMES A WEEK.   losartan (COZAAR) 25 MG tablet TAKE ONE TABLET BY MOUTH DAILY   meloxicam (MOBIC) 15 MG tablet Take 15 mg by mouth daily.   metFORMIN (GLUCOPHAGE) 500 MG tablet TAKE ONE TABLET BY MOUTH TWICE DAILY   oxybutynin (DITROPAN-XL) 10 MG 24 hr tablet Take 10 mg by mouth at bedtime.   polyethylene glycol (MIRALAX / GLYCOLAX) 17 g packet Take 17 g by mouth daily as needed for mild constipation.   pregabalin (LYRICA) 75 MG capsule Take 1 capsule (75 mg total) by mouth 3 (three) times daily.   tirzepatide [facility]) 5 MG/0.5ML Pen Inject 5 mg into the skin once a week.   tolterodine (DETROL LA) 4 MG 24 hr capsule Take 1 capsule (4 mg total) by mouth daily.   triamcinolone ointment (KENALOG) 0.5 % Apply 1 Application topically 2 (two) times daily.   No facility-administered medications prior to visit.    Review of Systems  Eyes:  Positive for blurred vision (Recent bilateral cataract surgery).  Skin:  Positive for rash.  Endo/Heme/Allergies:  Bruises/bleeds easily.          Objective:     LMP  (LMP Unknown) Comment: age 70 BP Readings from Last 3 Encounters:  06/27/23 112/62  06/13/23 (!) 122/52  03/21/23 (!) 149/78   Wt Readings from Last 3 Encounters:  06/27/23 251 lb (113.9 kg)  06/13/23 251 lb 11.2 oz (114.2 kg)  05/29/23 238 lb (108 kg)      Physical Exam Constitutional:      Appearance: Normal appearance.  HENT:     Right Ear: Tympanic membrane, ear canal and external ear normal.     Left Ear: Tympanic membrane, ear canal and external ear normal.     Mouth/Throat:     Mouth: Mucous membranes are moist.     Pharynx: Oropharynx is clear.  Eyes:     Conjunctiva/sclera: Conjunctivae normal.  Cardiovascular:     Rate and Rhythm: Normal rate and regular rhythm.     Heart sounds: Normal heart sounds.  Pulmonary:     Effort: Pulmonary effort is normal.     Breath sounds: Normal breath sounds.  Neurological:  General: No focal deficit present.     Mental Status: She is alert and oriented to person, place, and time.     No results found for any visits on 07/01/23. Last CBC Lab Results  Component Value Date   WBC 5.1 02/14/2023   HGB 11.0 (L) 02/14/2023   HCT 36.1 02/14/2023   MCV 77 (L) 02/14/2023   MCH 23.4 (L) 02/14/2023   RDW 16.4 (H) 02/14/2023   PLT 276 02/14/2023   Last metabolic panel Lab Results  Component Value Date   GLUCOSE 122 (H) 02/14/2023   NA 139 02/14/2023   K 4.7 02/14/2023   CL 102 02/14/2023   CO2 21 02/14/2023   BUN 26 02/14/2023   CREATININE 0.91 02/14/2023   EGFR 68 02/14/2023   CALCIUM 9.0 02/14/2023   PHOS 3.1 02/21/2022   PROT 7.3 02/14/2023   ALBUMIN 4.0 02/14/2023   LABGLOB 3.3 02/14/2023   AGRATIO 1.2 02/14/2023   BILITOT <0.2 02/14/2023   ALKPHOS 137 (H) 02/14/2023   AST 20 02/14/2023   ALT 17 02/14/2023   ANIONGAP 10 04/05/2022   Last lipids Lab Results  Component Value Date   CHOL 321 (H) 02/14/2023   HDL 48 02/14/2023   LDLCALC 223 (H) 02/14/2023   LDLDIRECT 183.0  12/13/2016   TRIG 247 (H) 02/14/2023   CHOLHDL 6.7 (H) 02/14/2023   Last hemoglobin A1c Lab Results  Component Value Date   HGBA1C 7.4 (A) 02/14/2023        Assessment & Plan:    Routine Health Maintenance and Physical Exam  Immunization History  Administered Date(s) Administered   Influenza,inj,Quad PF,6+ Mos 09/06/2015, 12/13/2016   Pneumococcal Polysaccharide-23 12/13/2016    Health Maintenance  Topic Date Due   COVID-19 Vaccine (1) Never done   DTaP/Tdap/Td (1 - Tdap) Never done   Zoster Vaccines- Shingrix (1 of 2) Never done   Colonoscopy  Never done   MAMMOGRAM  Never done   Pneumonia Vaccine 52+ Years old (2 of 2 - PCV) 09/20/2018   DEXA SCAN  Never done   Diabetic kidney evaluation - Urine ACR  04/20/2023   FOOT EXAM  04/20/2023   INFLUENZA VACCINE  07/04/2023   HEMOGLOBIN A1C  08/17/2023   Diabetic kidney evaluation - eGFR measurement  02/14/2024   OPHTHALMOLOGY EXAM  03/19/2024   Medicare Annual Wellness (AWV)  05/28/2024   Hepatitis C Screening  Completed   HPV VACCINES  Aged Out    Discussed health benefits of physical activity, and encouraged her to engage in regular exercise appropriate for her age and condition.  Problem List Items Addressed This Visit       Cardiovascular and Mediastinum   Hypertension associated with diabetes (HCC)    Chronic and stable. BP was well controlled in office today at 126/70. CTM      Relevant Orders   Comprehensive metabolic panel   Senile purpura (HCC)    Chronic and stable CTM      Chronic systolic CHF (congestive heart failure), NYHA class 3 (HCC)    Chronic and stable. Followed by cardiology. CTM        Endocrine   Type 2 diabetes mellitus with complication, without long-term current use of insulin (HCC)    Controlled. Recently increased Monjaro to 5 mg weekly. Since then she has reported excellent blood sugar control. Her highest blood sugar reading in the morning was 132, but it was after her  birthday dinner. Sometimes she notices her blood sugar being too  low but she eats something and it increases. Is wondering if she would be eligible for a CGM Continue current management Diabetic foot exam nml   HbgA1c Urine Cr       Relevant Medications   Continuous Glucose Sensor (FREESTYLE LIBRE 3 SENSOR) MISC   Hyperlipidemia associated with type 2 diabetes mellitus (HCC)    Not well controlled. Lipid panel collected in march was not wnl. 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 Has statin intolerance. Continue Zetia  Lipid panel      Relevant Orders   Comprehensive metabolic panel   Lipid panel     Nervous and Auditory   Polyneuropathy associated with underlying disease (HCC)    Chronic and stable CTM        Musculoskeletal and Integument   Rheumatoid arthritis involving multiple sites with positive rheumatoid factor (HCC)   Olecranon bursitis of left elbow    Stable. Recently had surgery on her L rotator cuff. Is followed by orthopedics who will manage her bursitis once fully healed.  CTM      Drug-induced myopathy    Contraindication to statins.        Other   Iron deficiency anemia    Chronic. Poorly controlled. Last CBC showed anemia. Last iron panel was significant for abnormal iron and iron saturation.  CBC  Iron panel        Relevant Orders   CBC   Iron, TIBC and Ferritin Panel   Severe episode of recurrent major depressive disorder, without psychotic features (HCC)    Chronic and stable  CTM      Other Visit Diagnoses     Encounter for annual physical exam    -  Primary      Return in about 6 months (around 01/01/2024) for chronic disease f/u.     Rometta Emery, Medical Student   Patient seen along with MS3 student Jodi Marble. I personally evaluated this patient along with the student, and verified all aspects of the history, physical exam, and medical decision making as documented by the student. I  agree with the student's documentation and have made all necessary edits.  Kaiyon Hynes, Marzella Schlein, MD, MPH [facility]

## 2023-07-01 NOTE — Assessment & Plan Note (Signed)
Chronic. Poorly controlled. Last CBC showed anemia. Last iron panel was significant for abnormal iron and iron saturation.  CBC  Iron panel

## 2023-07-01 NOTE — Assessment & Plan Note (Addendum)
Controlled. Recently increased Monjaro to 5 mg weekly. Since then she has reported excellent blood sugar control. Her highest blood sugar reading in the morning was 132, but it was after her birthday dinner. Sometimes she notices her blood sugar being too low but she eats something and it increases. Is wondering if she would be eligible for a CGM Continue current management Diabetic foot exam nml   HbgA1c Urine Cr

## 2023-07-02 ENCOUNTER — Telehealth: Payer: Self-pay | Admitting: Family Medicine

## 2023-07-02 ENCOUNTER — Telehealth: Payer: Self-pay

## 2023-07-02 DIAGNOSIS — M25612 Stiffness of left shoulder, not elsewhere classified: Secondary | ICD-10-CM | POA: Diagnosis not present

## 2023-07-02 DIAGNOSIS — M25512 Pain in left shoulder: Secondary | ICD-10-CM | POA: Diagnosis not present

## 2023-07-02 NOTE — Telephone Encounter (Signed)
Covermymeds is requesting prior authorization Key: [id number] Name: [name] [name]

## 2023-07-02 NOTE — Progress Notes (Signed)
   Care Guide Note  07/02/2023 Name: [name] [name] MRN: [id number] DOB: February 11, 1951  Referred by: Erasmo Downer, MD Reason for referral : Care Coordination (Outreach to schedule with Pharm d )   [name] [name] is a 72 y.o. year old female who is a primary care patient of Beryle Flock, Marzella Schlein, MD. [place] was referred to the pharmacist for assistance related to DM.    An unsuccessful telephone outreach was attempted today to contact the patient who was referred to the pharmacy team for assistance with medication management. Additional attempts will be made to contact the patient.   Penne Lash, RMA Care Guide [facility]  [place], [place] [address] Direct Dial: [phone] [email or web address]

## 2023-07-08 ENCOUNTER — Other Ambulatory Visit: Payer: Self-pay | Admitting: Family Medicine

## 2023-07-08 DIAGNOSIS — M25512 Pain in left shoulder: Secondary | ICD-10-CM | POA: Diagnosis not present

## 2023-07-08 DIAGNOSIS — M25612 Stiffness of left shoulder, not elsewhere classified: Secondary | ICD-10-CM | POA: Diagnosis not present

## 2023-07-08 NOTE — Telephone Encounter (Signed)
[facility] called and spoke to [name] [name], [name] [name] about the refill(s) hydrocortisone requested. Advised it was sent on 07/01/23. He says it's an OTC medication, so it was not filled. Patient called, left VM to return the call regarding the refill request. Let her know [name] [name] can pick up this over the counter when [name] [name] returns the call.

## 2023-07-08 NOTE — Telephone Encounter (Signed)
Patient said that the pharmacy never received the rx for hydrocortisone cream 0.5 % . Can rx be resent to  [id number] [place], [place] - [address] Phone: [phone]  Fax: [phone]

## 2023-07-09 ENCOUNTER — Telehealth: Payer: Self-pay

## 2023-07-09 DIAGNOSIS — E1159 Type 2 diabetes mellitus with other circulatory complications: Secondary | ICD-10-CM

## 2023-07-09 MED ORDER — LOSARTAN POTASSIUM 25 MG PO TABS
12.5000 mg | ORAL_TABLET | Freq: Every day | ORAL | 1 refills | Status: DC
Start: 2023-07-09 — End: 2024-06-12

## 2023-07-09 NOTE — Telephone Encounter (Signed)
-----   Message from Shirlee Latch sent at 07/05/2023 11:39 AM EDT ----- Cholesterol and A1c are improving.  Urine protein level is high. Recommend losartan 12.5mg  daily (half of a 25 mg pill) (CMAs ok to send if patient agrees #90 r1) to decrease risk of kidney disease.  Recheck bmp in 2 weeks after starting losartan.

## 2023-07-15 NOTE — Telephone Encounter (Signed)
Questions expired 08/09. PA Renew

## 2023-07-15 NOTE — Telephone Encounter (Signed)
Dillon Bjork (Key: [id number])

## 2023-07-18 NOTE — Telephone Encounter (Signed)
Outcome Denied on August 14 by [employer] 2017 NCPDP Request Reference Number: [id number]. FREESTY LIBR KIT 3 SENSOR is denied for not meeting the prior authorization requirement(s). Details of this decision are in the notice attached below or have been faxed to you. Drug FreeStyle Libre 3 Sensor

## 2023-07-29 ENCOUNTER — Telehealth: Payer: Self-pay

## 2023-07-29 NOTE — Telephone Encounter (Signed)
We do not have samples of these medications. Maybe pharmacy Gentry Fitz) could help find a way to get her these meds?

## 2023-07-29 NOTE — Telephone Encounter (Signed)
Copied from CRM [id number]. Topic: General - Other >> Jul 29, 2023  3:38 PM Dondra Prader E wrote: Reason for CRM:   Bupropion 300  Losartan Potassium  Duloxetine 60 Metformin 500 Pregabalin 75  [facility] is responsible for sending her last box never sent her last supply.   Pt needs samples to last her  Says her insurance will not pay for her to have these medications refilled again  Requesting a call back from clinic   Best contact: [phone]  Also wants to know about the cream for her face regarding her eczema.

## 2023-07-31 ENCOUNTER — Other Ambulatory Visit: Payer: 59 | Admitting: Pharmacist

## 2023-07-31 NOTE — Progress Notes (Signed)
   07/31/2023  Patient ID: [name] [name], female   DOB: 02/18/1951, 72 y.o.   MRN: [id number]  Receive message from office requesting outreach to patient regarding medication access/adherence. Per message patient receives her medications through [facility] and last supply never arrived.  Outreach to patient by telephone today.   Patient reports that she has now received her medications as of yesterday. Found that issue was related to her previous pharmacy, [facility], merging with another pharmacy. Reports new pharmacy is now called [facility] (Phone number: [phone]). Reports that she plans to continue to use [facility], except for her Greggory Keen, which she receives from [facility]. Update patient's preferred pharmacy list accordingly.  From review of chart, note that I previously reached out to patient on 06/07/2023 related to medication adherence to losartan.  Today patient shares that she had previously stopped taking her losartan, but that she restarted taking it after her latest appointment with PCP, now taking losartan 25 mg - 1 tablet daily. Reports has recently had some dizzy spells. Reports last checked home BP yesterday, reading ~129/78.  From review of chart, see in lab note from 07/01/2023 labs, PCP recommended: "losartan 12.5mg  daily (half of a 25 mg pill) (CMAs ok to send if patient agrees #90 r1) to decrease risk of kidney disease.  Recheck bmp in 2 weeks after starting losartan." - Find that this prescription was sent to [facility], rather than [facility] pack pharmacy and patient reports that she forgot the instruction and has been taking the full tablet of losartan 25 mg daily.   Assessment/Plan:  - Update patient's preferred pharmacy list in chart  - Patient to follow up with office to complete follow up bmp lab work  - Collaborate with [employer] on behalf of patient. Speak with Jill Alexanders. Request pharmacy transfer latest losartan 25 mg  prescription from 07/09/2023 (direction take 1/2 tablet daily) from [facility] to replace previous losartan prescription on file  - Patient confirms will start taking losartan 25 mg - 1/2 tablet daily as recommended by PCP  - Recommend to continue to monitor home blood pressure, keep log of results and have this record to review at upcoming medical appointments. Patient to contact provider office sooner if needed for readings outside of established parameters or symptoms  - Patient requesting cream for her face for eczema. Says previously discussed with Dr. Leonard Schwartz  Will send message to PCP  Follow up Plan: Clinical Pharmacist will outreach to patient by telephone on 09/04/2023 at 1:30 PM to review medications/follow up regarding blood pressure   Estelle Grumbles, PharmD, [facility] [phone]

## 2023-07-31 NOTE — Telephone Encounter (Signed)
Addressed with patient. See note from 07/31/2023 for further details

## 2023-07-31 NOTE — Patient Instructions (Signed)
Goals Addressed             This Visit's Progress    Pharmacy Goals       Please follow up with the office to schedule follow up kidney function lab work.  Check your blood pressure once daily, and any time you have concerning symptoms like headache, chest pain, dizziness, shortness of breath, or vision changes.   Our goal is less than 130/80.  To appropriately check your blood pressure, make sure you do the following:  1) Avoid caffeine, exercise, or tobacco products for 30 minutes before checking. Empty your bladder. 2) Sit with your back supported in a flat-backed chair. Rest your arm on something flat (arm of the chair, table, etc). 3) Sit still with your feet flat on the floor, resting, for at least 5 minutes.  4) Check your blood pressure. Take 1-2 readings.  5) Write down these readings and bring with you to any provider appointments.  Bring your home blood pressure machine with you to a provider's office for accuracy comparison at least once a year.   Make sure you take your blood pressure medications before you come to any office visit, even if you were asked to fast for labs.  Estelle Grumbles, PharmD, [facility] [phone]

## 2023-08-01 ENCOUNTER — Other Ambulatory Visit: Payer: Self-pay | Admitting: Family Medicine

## 2023-08-01 MED ORDER — HYDROCORTISONE 0.5 % EX CREA: 1.0000 | TOPICAL_CREAM | Freq: Two times a day (BID) | CUTANEOUS | 1 refills | Status: DC

## 2023-08-08 ENCOUNTER — Ambulatory Visit (INDEPENDENT_AMBULATORY_CARE_PROVIDER_SITE_OTHER): Payer: 59 | Admitting: Family Medicine

## 2023-08-08 ENCOUNTER — Encounter: Payer: Self-pay | Admitting: Family Medicine

## 2023-08-08 VITALS — BP 120/70 | HR 90 | Temp 97.8°F | Resp 16 | Ht 68.0 in | Wt 246.4 lb

## 2023-08-08 DIAGNOSIS — M7022 Olecranon bursitis, left elbow: Secondary | ICD-10-CM | POA: Diagnosis not present

## 2023-08-08 DIAGNOSIS — L03114 Cellulitis of left upper limb: Secondary | ICD-10-CM | POA: Diagnosis not present

## 2023-08-08 MED ORDER — CEPHALEXIN 500 MG PO CAPS: 500.0000 mg | ORAL_CAPSULE | Freq: Four times a day (QID) | ORAL | 0 refills | Status: AC

## 2023-08-08 NOTE — Progress Notes (Signed)
   Acute Office Visit  Subjective:     Patient ID: [name] [name], female    DOB: 1950/12/23, 72 y.o.   MRN: [id number]  Chief Complaint  Patient presents with   Elbow Pain    Patient c/o left elbow pain and swelling x a few months.     HPI Discussed the use of AI scribe software for clinical note transcription with the patient, who gave verbal consent to proceed.  History of Present Illness   The patient, with a history of rheumatoid arthritis and diabetes, presents with a swollen, warm, and red elbow. The patient reports that the swelling has been present for some time and has been previously evaluated by an orthopedist, Dr. Odis Luster. The patient was not given antibiotics for the swelling at that time. The patient also reports having had a shoulder surgery performed by Dr. Odis Luster, after which they have experienced limited mobility and inability to fully use their arm. The patient expresses dissatisfaction with the cessation of their physical therapy, believing it to have been stopped too soon. The patient also mentions a history of cataract surgery and current use of eye drops.       ROS      Objective:    BP 120/70 (BP Location: Right Arm, Patient Position: Sitting, Cuff Size: Large)   Pulse 90   Temp 97.8 F (36.6 C) (Temporal)   Resp 16   Ht 5\' 8"  (1.727 m)   Wt 246 lb 6.4 oz (111.8 kg)   LMP  (LMP Unknown) Comment: age 32  SpO2 98%   BMI 37.46 kg/m    Physical Exam  Physical Exam   MUSCULOSKELETAL: Limited range of motion in the arm, unable to turn hand over. SKIN: Elbow swelling, redness, warmth.       No results found for any visits on 08/08/23.      Assessment & Plan:   Problem List Items Addressed This Visit       Musculoskeletal and Integument   Olecranon bursitis of left elbow - Primary   Other Visit Diagnoses     Cellulitis of left upper extremity               Olecranon Bursitis Recurrent elbow swelling with signs of infection (warmth,  redness, fever). History of similar episode in April. Patient has RA, which may compromise immune response. -Start Keflex (Cephalexin) four times a day for skin and soft tissue infection. -If no improvement by Monday, 08/12/2023, patient to inform office for possible switch to Clindamycin.  Shoulder Dysfunction Post-Surgery Patient reports limited range of motion and function after left shoulder surgery. Concerns about premature cessation of physical therapy. -Advise patient to contact orthopedic surgeon (Dr. Odis Luster) regarding concerns and potential for additional physical therapy.  General Health Maintenance -Declined flu shot.        Meds ordered this encounter  Medications   cephALEXin (KEFLEX) 500 MG capsule    Sig: Take 1 capsule (500 mg total) by mouth 4 (four) times daily for 7 days.    Dispense:  28 capsule    Refill:  0    Return if symptoms worsen or fail to improve.  Shirlee Latch, MD

## 2023-08-12 ENCOUNTER — Encounter: Payer: Self-pay | Admitting: Family Medicine

## 2023-08-12 ENCOUNTER — Telehealth: Payer: Self-pay

## 2023-08-12 NOTE — Telephone Encounter (Signed)
Did you get a message from her with a picture? I didn't see anything.

## 2023-08-12 NOTE — Telephone Encounter (Signed)
Copied from CRM [id number]. Topic: General - Other >> Aug 12, 2023 12:20 PM Franchot Heidelberg wrote: Reason for CRM: Pt called following up on visit and picture she sent, please advise wants to speak to the clinic.   Best contact: [phone]

## 2023-08-12 NOTE — Telephone Encounter (Signed)
I have not seen any recent picture

## 2023-08-16 ENCOUNTER — Other Ambulatory Visit: Payer: Self-pay

## 2023-08-21 ENCOUNTER — Ambulatory Visit (INDEPENDENT_AMBULATORY_CARE_PROVIDER_SITE_OTHER): Payer: 59 | Admitting: Physician Assistant

## 2023-08-21 ENCOUNTER — Ambulatory Visit: Payer: 59 | Admitting: Physician Assistant

## 2023-08-21 ENCOUNTER — Encounter: Payer: Self-pay | Admitting: Physician Assistant

## 2023-08-21 VITALS — BP 138/80 | HR 94 | Temp 98.2°F | Ht 68.0 in | Wt 245.4 lb

## 2023-08-21 DIAGNOSIS — R142 Eructation: Secondary | ICD-10-CM | POA: Diagnosis not present

## 2023-08-21 DIAGNOSIS — R1310 Dysphagia, unspecified: Secondary | ICD-10-CM | POA: Diagnosis not present

## 2023-08-21 DIAGNOSIS — R110 Nausea: Secondary | ICD-10-CM | POA: Diagnosis not present

## 2023-08-21 DIAGNOSIS — K5909 Other constipation: Secondary | ICD-10-CM

## 2023-08-21 DIAGNOSIS — K5904 Chronic idiopathic constipation: Secondary | ICD-10-CM

## 2023-08-21 DIAGNOSIS — D509 Iron deficiency anemia, unspecified: Secondary | ICD-10-CM

## 2023-08-21 MED ORDER — ONDANSETRON HCL 4 MG PO TABS
4.0000 mg | ORAL_TABLET | Freq: Three times a day (TID) | ORAL | 1 refills | Status: AC | PRN
Start: 2023-08-21 — End: 2023-11-19

## 2023-08-21 NOTE — Progress Notes (Signed)
Celso Amy, PA-C [address]  [address]  [place], [place] [address]  Main: [phone]  Fax: [phone]   Gastroenterology Consultation  Referring Provider:     Caro Laroche, DO Primary Care Physician:  Erasmo Downer, MD Primary Gastroenterologist:  Celso Amy, PA-C / Dr. Wyline Mood   Reason for Consultation:     Iron deficiency anemia        HPI:   [name] [name] is a 72 y.o. y/o female referred for consultation & management  by Erasmo Downer, MD to evaluate iron deficiency anemia.  Patient has lost over 100 pounds in the past 1.5 years.  She has changed to a healthier diet and has been trying to lose weight.  She has multiple GI symptoms including nausea, belching, gas, dyspepsia, upper abdominal discomfort.  Occasional difficulty swallowing large pills.  No difficulty swallowing food.  She has constipation which is controlled on MiraLAX.  Denies melena or hematochezia.  No blood thinners.  She is not on iron.  She admits to taking meloxicam daily.  Lab 07/2022 showed microcytic iron deficiency anemia with hemoglobin 9.9, MCV 70, total iron 22, iron saturation 7%.  Normal folate and B12..  Lab 07/01/2023 showed greatly improved anemia with hemoglobin 11.6, MCV 80, normal total iron 54, iron saturation 16%, ferritin 28.  EGD 01/2022 by Dr. 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  Normal stomach and duodenum.  No evidence of lower esophageal stricture.  She reports having a colonoscopy over 10 years ago in [place], reportedly normal.  Past Medical History:  Diagnosis Date   Anxiety    Arthritis    Rhumetoid arthritis   BRCA negative 07/2017   MyRisk neg   Cancer (HCC)    uterine   Cellulitis of both lower extremities    Chronic   CHF (congestive heart failure) (HCC)    Coronary artery disease    COVID-19 11/2020   Depression    Diabetes  mellitus without complication (HCC)    Family history of breast cancer 07/2017   MyRisk neg; IBIS=10.5%/riskscore=17%   Family history of ovarian cancer    Fibromyalgia    Fibromyalgia affecting shoulder region    Headache    Hyperlipidemia    Hypertension    Spinal stenosis    Torn rotator cuff    bilateral   Wears dentures    full upper    Past Surgical History:  Procedure Laterality Date   ABLATION SAPHENOUS VEIN W/ RFA     ANAL FISSURECTOMY  01/2004   CATARACT EXTRACTION W/PHACO Left 06/13/2023   Procedure: CATARACT EXTRACTION PHACO AND INTRAOCULAR LENS PLACEMENT (IOC) LEFT DIABETIC  5.79  00:42.7;  Surgeon: Estanislado Pandy, MD;  Location: [facility];  Service: Ophthalmology;  Laterality: Left;   CATARACT EXTRACTION W/PHACO Right 06/27/2023   Procedure: CATARACT EXTRACTION PHACO AND INTRAOCULAR LENS PLACEMENT (IOC) RIGHT DIABETIC 4.21 00.39.8;  Surgeon: Estanislado Pandy, MD;  Location: [facility];  Service: Ophthalmology;  Laterality: Right;   COLON SURGERY     CORONARY ANGIOPLASTY     CYSTOSCOPY  09/03/2017   Procedure: CYSTOSCOPY;  Surgeon: Nadara Mustard, MD;  Location: [facility] ORS;  Service: Gynecology;;   DILATION AND CURETTAGE OF UTERUS     ESOPHAGEAL DILATION  02/20/2022   Procedure: ESOPHAGEAL DILATION;  Surgeon: Jeani Hawking, MD;  Location: [facility] END[facility];  Service:  Gastroenterology;;  savory   ESOPHAGOGASTRODUODENOSCOPY (EGD) WITH PROPOFOL N/A 02/20/2022   Procedure: ESOPHAGOGASTRODUODENOSCOPY (EGD) WITH PROPOFOL;  Surgeon: Jeani Hawking, MD;  Location: [facility] ENDOSCOPY;  Service: Gastroenterology;  Laterality: N/A;  Dyspahgia   EYE SURGERY Bilateral 08/2020   eye lift   HERNIA REPAIR     INCISION AND DRAINAGE OF WOUND  03/04/2022   Procedure: IRRIGATION AND DEBRIDEMENT ABDOMEN WITH VAC PLACEMENT, MYRIAD PLACEMENT;  Surgeon: Peggye Form, DO;  Location: [facility] OR;  Service: Plastics;;   LAPAROSCOPIC HYSTERECTOMY N/A 09/03/2017    Procedure: HYSTERECTOMY TOTAL LAPAROSCOPIC BSO;  Surgeon: Nadara Mustard, MD;  Location: [facility] ORS;  Service: Gynecology;  Laterality: N/A;   MEDIAL PARTIAL KNEE REPLACEMENT Left 01/06/2014   PANNICULECTOMY N/A 02/05/2022   Procedure: PANNICULECTOMY;  Surgeon: Peggye Form, DO;  Location: [facility] OR;  Service: Plastics;  Laterality: N/A;  3 hours   total arthroplasty of left shoulder Left 05/04/2023   Dr. Odis Luster   UMBILICAL HERNIA REPAIR N/A 09/05/2015   Procedure: HERNIA REPAIR incarcerated UMBILICAL ADULT;  Surgeon: Natale Lay, MD;  Location: [facility] ORS;  Service: General;  Laterality: N/A;   Vascular Stent  11/04/2013    Prior to Admission medications   Medication Sig Start Date End Date Taking? Authorizing Provider  acetaminophen (TYLENOL) 650 MG CR tablet Take 650 mg by mouth every 8 (eight) hours as needed for pain.   Yes [provider]  Biotin w/ Vitamins C & E (HAIR/SKIN/NAILS PO) Take 1 tablet by mouth daily.   Yes [provider]  Brexpiprazole (REXULTI) 0.5 MG TABS Take 1 tablet (0.5 mg total) by mouth daily. 02/14/23  Yes Bacigalupo, Marzella Schlein, MD  buPROPion (WELLBUTRIN XL) 300 MG 24 hr tablet TAKE ONE TABLET BY MOUTH DAILY 06/07/23  Yes Bacigalupo, Marzella Schlein, MD  DULoxetine (CYMBALTA) 60 MG capsule TAKE ONE CAPSULE BY MOUTH DAILY 03/01/23  Yes Bacigalupo, Marzella Schlein, MD  ezetimibe (ZETIA) 10 MG tablet Take 1 tablet (10 mg total) by mouth daily. 02/15/23  Yes Bacigalupo, Marzella Schlein, MD  GEMTESA 75 MG TABS Take 1 tablet (75 mg total) by mouth daily. 01/07/23 01/02/24 Yes MacDiarmid, Lorin Picket, MD  ketoconazole (NIZORAL) 2 % shampoo APPLY 1 APPLICATION TOPICALLY 2 (TWO) TIMES A WEEK. 04/18/23  Yes Bacigalupo, Marzella Schlein, MD  losartan (COZAAR) 25 MG tablet Take 0.5 tablets (12.5 mg total) by mouth daily. 07/09/23  Yes Bacigalupo, Marzella Schlein, MD  meloxicam (MOBIC) 15 MG tablet Take 15 mg by mouth daily. 02/15/23  Yes [provider]  metFORMIN (GLUCOPHAGE) 500 MG tablet TAKE ONE  TABLET BY MOUTH TWICE DAILY 03/01/23  Yes Bacigalupo, Marzella Schlein, MD  polyethylene glycol (MIRALAX / GLYCOLAX) 17 g packet Take 17 g by mouth daily as needed for mild constipation. 03/06/22  Yes Osvaldo Shipper, MD  pregabalin (LYRICA) 75 MG capsule Take 1 capsule (75 mg total) by mouth 3 (three) times daily. 05/16/23  Yes Simmons-Robinson, Makiera, MD  tirzepatide [facility]) 5 MG/0.5ML Pen Inject 5 mg into the skin once a week. 02/14/23  Yes Bacigalupo, Marzella Schlein, MD  tolterodine (DETROL LA) 4 MG 24 hr capsule Take 1 capsule (4 mg total) by mouth daily. 01/07/23 01/02/24 Yes MacDiarmid, Lorin Picket, MD  oxybutynin (DITROPAN-XL) 10 MG 24 hr tablet Take 10 mg by mouth at bedtime.    [provider]    Family History  Problem Relation Age of Onset   Hypertension Mother    Cataracts Mother    Thyroid disease Mother    Dementia  Mother    COPD Father    Dementia Father    Cataracts Father    Aneurysm Father        Abdominal & Brain   Diabetes Father    Melanoma Father    Breast cancer Sister 38   Fibromyalgia Sister    Migraines Sister    Hypertension Sister    Breast cancer Sister 40   COPD Sister    Ovarian cancer Sister 2   Migraines Brother    Heart attack Brother    Colon cancer Paternal Uncle        71s   Colon cancer Paternal Uncle        21s   Uterine cancer Cousin    Uterine cancer Cousin      Social History   Tobacco Use   Smoking status: Former    Current packs/day: 0.00    Average packs/day: 1 pack/day for 29.0 years (29.0 ttl pk-yrs)    Types: Cigarettes    Start date: 12/04/1967    Quit date: 12/03/1996    Years since quitting: 26.7    Passive exposure: Past   Smokeless tobacco: Never  Vaping Use   Vaping status: Never Used  Substance Use Topics   Alcohol use: No   Drug use: No    Allergies as of 08/21/2023 - Review Complete 08/21/2023  Allergen Reaction Noted   Sulfa antibiotics Itching, Rash, and Swelling 07/06/2015   Latex Itching 07/06/2015   Doxycycline  Rash 07/06/2015   Empagliflozin Itching and Other (See Comments) 11/25/2019    Review of Systems:    All systems reviewed and negative except where noted in HPI.   Physical Exam:  BP 138/80   Pulse 94   Temp 98.2 F (36.8 C)   Ht 5\' 8"  (1.727 m)   Wt 245 lb 6.4 oz (111.3 kg)   LMP  (LMP Unknown) Comment: age 39  BMI 37.31 kg/m  No LMP recorded (lmp unknown). Patient has had a hysterectomy.  General:   Alert,  Well-developed, obese, well-nourished, pleasant and cooperative in NAD Lungs:  Respirations even and unlabored.  Clear throughout to auscultation.   No wheezes, crackles, or rhonchi. No acute distress. Heart:  Regular rate and rhythm; no murmurs, clicks, rubs, or gallops. Abdomen:  Normal bowel sounds.  No bruits.  Soft, and obese without masses, hepatosplenomegaly or hernias noted.  No Tenderness.  No guarding or rebound tenderness.    Neurologic:  Alert and oriented x3;  grossly normal neurologically. Psych:  Alert and cooperative. Normal mood and affect.  Imaging Studies: No results found.  Assessment and Plan:   MARISLEYSIS ROGGENKAMP is a 72 y.o. y/o female has been referred for   1.  Iron deficiency anemia  Scheduling EGD & Colonoscopy I discussed risks of EGD and colonoscopy with patient to include risk of bleeding, perforation, and risk of sedation.  Patient expressed understanding and agrees to proceed with procedures.    2 day Liquid Prep Given.  Gave sample of Suflave Prep 1 day before procedure.  Take Linzess 1 capsule once daily for 4 days before procedure.  2.  Chronic constipation  Continue MiraLAX daily  3.  Nausea, belching, gas  Suspect GERD  Schedule EGD  H. pylori breath test  I am prescribed Zofran if needed for nausea during colonoscopy prep.  After EGD decide about starting PPI.  4.  Dysphagia to pills  EGD with possible dilation  Follow up in 3 months with TG.  Celso Amy, PA-C

## 2023-08-22 ENCOUNTER — Encounter: Payer: Self-pay | Admitting: Physician Assistant

## 2023-08-22 LAB — H. PYLORI BREATH TEST: H pylori Breath Test: NEGATIVE

## 2023-09-04 ENCOUNTER — Other Ambulatory Visit: Payer: 59 | Admitting: Pharmacist

## 2023-09-04 ENCOUNTER — Ambulatory Visit: Payer: Self-pay

## 2023-09-04 ENCOUNTER — Telehealth: Payer: Self-pay | Admitting: Pharmacist

## 2023-09-04 NOTE — Progress Notes (Addendum)
   Outreach Note  09/04/2023 Name: [name] [name] MRN: [id number] DOB: October 20, 1951  Referred by: Erasmo Downer, MD   Was unable to reach patient via telephone today and unable to leave a message as voicemail is full   Follow Up Plan: Will collaborate with Care Guide to outreach to schedule follow up  Estelle Grumbles, PharmD, [facility] [phone]

## 2023-09-04 NOTE — Telephone Encounter (Signed)
Summary: Symptomatic, declined appt. Seeking abx   Pt has chest/head congestion, is requesting an antibiotic but declined appt offer. [phone] [facility]          Voice mailbox is full, unable to leave a message.

## 2023-09-04 NOTE — Telephone Encounter (Signed)
Answer Assessment - Initial Assessment Questions 1. ONSET: "When did the cough begin?"      Pt called back in c/o productive cough with head and chest congestion.   She is wanting an antibiotic but is refusing an appt.   "I don't want an appt".    I let her know she would need to be seen in order to have an antibiotic prescribed because they are not just calling in antibiotics without being seen.   Pt. Said,   "Never mind, I don't want an appt".   "I just wanted an antibiotic called in".  "I have an autoimmune disease and feel like I need to be on an antibiotic".  I confirmed with her that she would not let me make an appt so she could get an antibiotic and she said,  "No I don't want an appt".   "Thank you anyway" and hung up.  2. SEVERITY: "How bad is the cough today?"       3. SPUTUM: "Describe the color of your sputum" (none, dry cough; clear, white, yellow, green)      4. HEMOPTYSIS: "Are you coughing up any blood?" If so ask: "How much?" (flecks, streaks, tablespoons, etc.)      5. DIFFICULTY BREATHING: "Are you having difficulty breathing?" If Yes, ask: "How bad is it?" (e.g., mild, moderate, severe)    - MILD: No SOB at rest, mild SOB with walking, speaks normally in sentences, can lie down, no retractions, pulse < 100.    - MODERATE: SOB at rest, SOB with minimal exertion and prefers to sit, cannot lie down flat, speaks in phrases, mild retractions, audible wheezing, pulse 100-120.    - SEVERE: Very SOB at rest, speaks in single words, struggling to breathe, sitting hunched forward, retractions, pulse > 120       6. FEVER: "Do you have a fever?" If Yes, ask: "What is your temperature, how was it measured, and when did it start?"      7. CARDIAC HISTORY: "Do you have any history of heart disease?" (e.g., heart attack, congestive heart failure)       8. LUNG HISTORY: "Do you have any history of lung disease?"  (e.g., pulmonary embolus, asthma, emphysema)      9. PE RISK FACTORS: "Do you  have a history of blood clots?" (or: recent major surgery, recent prolonged travel, bedridden)      10. OTHER SYMPTOMS: "Do you have any other symptoms?" (e.g., runny nose, wheezing, chest pain)        11. PREGNANCY: "Is there any chance you are pregnant?" "When was your last menstrual period?"        12. TRAVEL: "Have you traveled out of the country in the last month?" (e.g., travel history, exposures)  Protocols used: Cough - Acute Productive-A-AH  Chief Complaint: Head and chest congestion.   Wanting an antibiotic called in.   Refusing an appt.   "I just want an antibiotic called in".   "I don't want an appt".   She thanked me and hung up. Symptoms: productive cough, head and chest congestion Frequency: now Pertinent Negatives: Patient denies N/A she hung up so no triage done. Disposition: [] ED /[] Urgent Care (no appt availability in office) / [] Appointment(In office/virtual)/ []  [place] [facility]/ [] Home Care/ [] Refused Recommended Disposition /[] [place] [facility]/ [x]  Follow-up with PCP Additional Notes: Message sent to Dr. Beryle Flock for her information regarding pt refusing an appt.

## 2023-09-04 NOTE — Telephone Encounter (Signed)
Second attempt to reach pt. Voice mailbox is full, unable to leave a message.

## 2023-09-05 NOTE — Telephone Encounter (Signed)
Agree that she should be seen. Maybe she'd accept a virtual visit instead?

## 2023-09-05 NOTE — Telephone Encounter (Signed)
Called patient NA/mailbox is full. If patient calls back ok for [facility] nurse to give provider message.

## 2023-09-06 ENCOUNTER — Telehealth: Payer: Self-pay

## 2023-09-06 NOTE — Progress Notes (Signed)
  Care Coordination Note  09/06/2023 Name: [name] MRN: [id number] DOB: 01/23/1951  [name] [name] is a 72 y.o. year old female who is a primary care patient of Erasmo Downer, MD and is actively engaged with the Chronic Care Management team. I reached out to [name] [name] by phone today to assist with re-scheduling a follow up visit with the Pharmacist  Follow up plan: Unsuccessful telephone outreach attempt made. A HIPAA compliant phone message was left for the patient providing contact information and requesting a return call.  If patient returns call to provider office, please advise to call CCM Care Guide Penne Lash at [phone]  Penne Lash, RMA Care Guide [facility] [place]  [place], [place] [address] Direct Dial: [phone] [name]

## 2023-09-10 ENCOUNTER — Other Ambulatory Visit: Payer: Self-pay | Admitting: Family Medicine

## 2023-09-11 NOTE — Telephone Encounter (Signed)
Requested Prescriptions  Pending Prescriptions Disp Refills   DULoxetine (CYMBALTA) 60 MG capsule [Pharmacy Med Name: DULOXETINE 60MG  CAPSULE 60 Capsule] 30 capsule 5    Sig: TAKE ONE CAPSULE BY MOUTH DAILY     Psychiatry: Antidepressants - SNRI - duloxetine Passed - 09/10/2023  3:25 PM      Passed - Cr in normal range and within 360 days    Creatinine  Date Value Ref Range Status  08/31/2014 0.63 0.60 - 1.30 mg/dL Final   Creatinine, Ser  Date Value Ref Range Status  07/01/2023 0.83 0.57 - 1.00 mg/dL Final         Passed - eGFR is 30 or above and within 360 days    EGFR (African American)  Date Value Ref Range Status  08/31/2014 >60 >27mL/min Final  11/05/2013 >60  Final   GFR calc Af Amer  Date Value Ref Range Status  12/21/2020 72 >59 mL/min/1.73 Final    Comment:    **In accordance with recommendations from the NKF-ASN Task force,**   [facility] is in the process of updating its eGFR calculation to the   2021 CKD-EPI creatinine equation that estimates kidney function   without a race variable.    EGFR (Non-African Amer.)  Date Value Ref Range Status  08/31/2014 >60 >69mL/min Final    Comment:    eGFR values <47mL/min/1.73 m2 may be an indication of chronic kidney disease (CKD). Calculated eGFR, using the MRDR Study equation, is useful in  patients with stable renal function. The eGFR calculation will not be reliable in acutely ill patients when serum creatinine is changing rapidly. It is not useful in patients on dialysis. The eGFR calculation may not be applicable to patients at the low and high extremes of body sizes, pregnant women, and vegetarians.   11/05/2013 >60  Final    Comment:    eGFR values <22mL/min/1.73 m2 may be an indication of chronic kidney disease (CKD). Calculated eGFR is useful in patients with stable renal function. The eGFR calculation will not be reliable in acutely ill patients when serum creatinine is changing rapidly. It is not useful in   patients on dialysis. The eGFR calculation may not be applicable to patients at the low and high extremes of body sizes, pregnant women, and vegetarians.    GFR, Estimated  Date Value Ref Range Status  04/05/2022 >60 >60 mL/min Final    Comment:    (NOTE) Calculated using the CKD-EPI Creatinine Equation (2021)    GFR  Date Value Ref Range Status  12/13/2016 113.78 >60.00 mL/min Final   eGFR  Date Value Ref Range Status  07/01/2023 76 >59 mL/min/1.73 Final         Passed - Completed PHQ-2 or PHQ-9 in the last 360 days      Passed - Last BP in normal range    BP Readings from Last 1 Encounters:  08/21/23 138/80         Passed - Valid encounter within last 6 months    Recent Outpatient Visits           1 month ago Olecranon bursitis of left elbow   [place] [facility] [place], Marzella Schlein, MD   2 months ago Encounter for annual physical exam   [place] [facility] [place], Marzella Schlein, MD   5 months ago Cellulitis of left upper extremity   [place] [facility] Erasmo Downer, MD   6 months ago Type 2 diabetes mellitus  with complication, without long-term current use of insulin (HCC)   [place] [facility] [place], Marzella Schlein, MD   6 months ago Hypertension associated with diabetes [facility])   [place] [facility] [place], Marzella Schlein, MD       Future Appointments             In 3 months Bacigalupo, Marzella Schlein, MD [facility], [facility]             metFORMIN (GLUCOPHAGE) 500 MG tablet [Pharmacy Med Name: METFORMIN 500 MG TABS 500 Tablet] 60 tablet 5    Sig: TAKE ONE TABLET BY MOUTH TWICE DAILY     Endocrinology:  Diabetes - Biguanides Failed - 09/10/2023  3:25 PM      Failed - CBC within normal limits and completed in the last 12 months    WBC  Date Value Ref Range Status  07/01/2023 5.6 3.4 - 10.8 x10E3/uL Final  04/05/2022 5.6 4.0 - 10.5 K/uL  Final   RBC  Date Value Ref Range Status  07/01/2023 4.63 3.77 - 5.28 x10E6/uL Final  04/05/2022 4.04 3.87 - 5.11 MIL/uL Final   Hemoglobin  Date Value Ref Range Status  07/01/2023 11.6 11.1 - 15.9 g/dL Final   Hematocrit  Date Value Ref Range Status  07/01/2023 36.9 34.0 - 46.6 % Final   MCHC  Date Value Ref Range Status  07/01/2023 31.4 (L) 31.5 - 35.7 g/dL Final  13/07/6577 46.9 (L) 30.0 - 36.0 g/dL Final   [facility]  Date Value Ref Range Status  07/01/2023 25.1 (L) 26.6 - 33.0 pg Final  04/05/2022 23.8 (L) 26.0 - 34.0 pg Final   MCV  Date Value Ref Range Status  07/01/2023 80 79 - 97 fL Final  08/31/2014 86 80 - 100 fL Final   No results found for: "PLTCOUNTKUC", "LABPLAT", "POCPLA" RDW  Date Value Ref Range Status  07/01/2023 16.0 (H) 11.7 - 15.4 % Final  08/31/2014 16.3 (H) 11.5 - 14.5 % Final         Passed - Cr in normal range and within 360 days    Creatinine  Date Value Ref Range Status  08/31/2014 0.63 0.60 - 1.30 mg/dL Final   Creatinine, Ser  Date Value Ref Range Status  07/01/2023 0.83 0.57 - 1.00 mg/dL Final         Passed - HBA1C is between 0 and 7.9 and within 180 days    Hgb A1c MFr Bld  Date Value Ref Range Status  07/01/2023 5.8 (H) 4.8 - 5.6 % Final    Comment:             Prediabetes: 5.7 - 6.4          Diabetes: >6.4          Glycemic control for adults with diabetes: <7.0          Passed - eGFR in normal range and within 360 days    EGFR (African American)  Date Value Ref Range Status  08/31/2014 >60 >58mL/min Final  11/05/2013 >60  Final   GFR calc Af Amer  Date Value Ref Range Status  12/21/2020 72 >59 mL/min/1.73 Final    Comment:    **In accordance with recommendations from the NKF-ASN Task force,**   [facility] is in the process of updating its eGFR calculation to the   2021 CKD-EPI creatinine equation that estimates kidney function   without a race variable.    EGFR (Non-African Amer.)  Date Value Ref Range Status   08/31/2014 >60 >20mL/min Final    Comment:    eGFR values <75mL/min/1.73 m2 may be an indication of chronic kidney disease (CKD). Calculated eGFR, using the MRDR Study equation, is useful in  patients with stable renal function. The eGFR calculation will not be reliable in acutely ill patients when serum creatinine is changing rapidly. It is not useful in patients on dialysis. The eGFR calculation may not be applicable to patients at the low and high extremes of body sizes, pregnant women, and vegetarians.   11/05/2013 >60  Final    Comment:    eGFR values <96mL/min/1.73 m2 may be an indication of chronic kidney disease (CKD). Calculated eGFR is useful in patients with stable renal function. The eGFR calculation will not be reliable in acutely ill patients when serum creatinine is changing rapidly. It is not useful in  patients on dialysis. The eGFR calculation may not be applicable to patients at the low and high extremes of body sizes, pregnant women, and vegetarians.    GFR, Estimated  Date Value Ref Range Status  04/05/2022 >60 >60 mL/min Final    Comment:    (NOTE) Calculated using the CKD-EPI Creatinine Equation (2021)    GFR  Date Value Ref Range Status  12/13/2016 113.78 >60.00 mL/min Final   eGFR  Date Value Ref Range Status  07/01/2023 76 >59 mL/min/1.73 Final         Passed - B12 Level in normal range and within 720 days    Vitamin B-12  Date Value Ref Range Status  07/17/2022 429 232 - 1,245 pg/mL Final         Passed - Valid encounter within last 6 months    Recent Outpatient Visits           1 month ago Olecranon bursitis of left elbow   [facility] [place], Marzella Schlein, MD   2 months ago Encounter for annual physical exam   [place] [facility] [place], Marzella Schlein, MD   5 months ago Cellulitis of left upper extremity   [place] [facility] [place], Marzella Schlein, MD   6 months ago  Type 2 diabetes mellitus with complication, without long-term current use of insulin [facility])   [place] [facility] [place], Marzella Schlein, MD   6 months ago Hypertension associated with diabetes [facility])    [facility] Bacigalupo, Marzella Schlein, MD       Future Appointments             In 3 months Bacigalupo, Marzella Schlein, MD [facility], [facility]

## 2023-09-12 ENCOUNTER — Other Ambulatory Visit: Payer: Self-pay | Admitting: Family Medicine

## 2023-09-12 NOTE — Telephone Encounter (Signed)
Medication Refill - Medication: tirzepatide Greggory Keen) 5 MG/0.5ML Pen   Has the patient contacted their pharmacy? No. (Agent: If no, request that the patient contact the pharmacy for the refill. If patient does not wish to contact the pharmacy document the reason why and proceed with request.) (Agent: If yes, when and what did the pharmacy advise?)  Preferred Pharmacy (with phone number or street name): [id number] [place], [place] [address] Phone: [phone]  Fax: [phone]     Has the patient been seen for an appointment in the last year OR does the patient have an upcoming appointment? Yes.    Agent: Please be advised that RX refills may take up to 3 busine  your pharmacy.

## 2023-09-12 NOTE — Progress Notes (Signed)
  Care Coordination Note  09/12/2023 Name: [name] [name] MRN: [id number] DOB: 18-Nov-1951  [name] [name] is a 72 y.o. year old female who is a primary care patient of Erasmo Downer, MD and is actively engaged with the Chronic Care Management team. I reached out to Delton Prairie by phone today to assist with re-scheduling a follow up visit with the Pharmacist  Follow up plan: Unsuccessful telephone outreach attempt made. A HIPAA compliant phone message was left for the patient providing contact information and requesting a return call.  If patient returns call to provider office, please advise to call CCM Care Guide Penne Lash  at [phone]  Penne Lash, RMA Care Guide [facility]  [place], [place] [address] Direct Dial: [phone] [email or web address]

## 2023-09-12 NOTE — Telephone Encounter (Signed)
Requested medication (s) are due for refill today: yes   Requested medication (s) are on the active medication list: yes   Last refill:  02/14/23 # 6 ml 1 refills  Future visit scheduled: yes in 3 months  Notes to clinic:  medication not assigned to a protocol . Do you want to  refill Rx?     Requested Prescriptions  Pending Prescriptions Disp Refills   tirzepatide (MOUNJARO) 5 MG/0.5ML Pen 6 mL 1    Sig: Inject 5 mg into the skin once a week.     Off-Protocol Failed - 09/12/2023  3:57 PM      Failed - Medication not assigned to a protocol, review manually.      Passed - Valid encounter within last 12 months    Recent Outpatient Visits           1 month ago Olecranon bursitis of left elbow   [place] [facility] [place], Marzella Schlein, MD   2 months ago Encounter for annual physical exam   [place] [facility] [place], Marzella Schlein, MD   5 months ago Cellulitis of left upper extremity   [place] [facility] [place], Marzella Schlein, MD   6 months ago Type 2 diabetes mellitus with complication, without long-term current use of insulin [facility])   [place] [facility] [place], Marzella Schlein, MD   7 months ago Hypertension associated with diabetes [facility])   [place] [facility] Bacigalupo, Marzella Schlein, MD       Future Appointments             In 3 months Bacigalupo, Marzella Schlein, MD [facility], [facility]

## 2023-09-13 ENCOUNTER — Other Ambulatory Visit: Payer: Self-pay | Admitting: Family Medicine

## 2023-09-13 DIAGNOSIS — E1169 Type 2 diabetes mellitus with other specified complication: Secondary | ICD-10-CM

## 2023-09-13 NOTE — Telephone Encounter (Signed)
Requested Prescriptions  Pending Prescriptions Disp Refills   ezetimibe (ZETIA) 10 MG tablet [Pharmacy Med Name: EZETIMIBE 10 MG TABLET] 90 tablet 1    Sig: TAKE 1 TABLET BY MOUTH EVERY DAY     Cardiovascular:  Antilipid - Sterol Transport Inhibitors Failed - 09/13/2023  2:30 AM      Failed - Lipid Panel in normal range within the last 12 months    Cholesterol, Total  Date Value Ref Range Status  07/01/2023 245 (H) 100 - 199 mg/dL Final   LDL Chol Calc (NIH)  Date Value Ref Range Status  07/01/2023 164 (H) 0 - 99 mg/dL Final   Direct LDL  Date Value Ref Range Status  12/13/2016 183.0 mg/dL Final    Comment:    Optimal:  <100 mg/dLNear or Above Optimal:  100-129 mg/dLBorderline High:  130-159 mg/dLHigh:  160-189 mg/dLVery High:  >190 mg/dL   HDL  Date Value Ref Range Status  07/01/2023 45 >39 mg/dL Final   Triglycerides  Date Value Ref Range Status  07/01/2023 197 (H) 0 - 149 mg/dL Final         Passed - AST in normal range and within 360 days    AST  Date Value Ref Range Status  07/01/2023 17 0 - 40 IU/L Final         Passed - ALT in normal range and within 360 days    ALT  Date Value Ref Range Status  07/01/2023 8 0 - 32 IU/L Final         Passed - Patient is not pregnant      Passed - Valid encounter within last 12 months    Recent Outpatient Visits           1 month ago Olecranon bursitis of left elbow   [place] [facility] [place], Marzella Schlein, MD   2 months ago Encounter for annual physical exam   [place] [facility] [place], Marzella Schlein, MD   5 months ago Cellulitis of left upper extremity   [place] [facility] [place], Marzella Schlein, MD   6 months ago Type 2 diabetes mellitus with complication, without long-term current use of insulin [facility])   [place] [facility] [place], Marzella Schlein, MD   7 months ago Hypertension associated with diabetes [facility])   [place] [facility] Bacigalupo, Marzella Schlein, MD       Future Appointments             In 3 months Bacigalupo, Marzella Schlein, MD [facility], [facility]

## 2023-09-16 MED ORDER — TIRZEPATIDE 5 MG/0.5ML ~~LOC~~ SOAJ: 5.0000 mg | SUBCUTANEOUS | 1 refills | Status: DC

## 2023-09-18 NOTE — Progress Notes (Signed)
   Care Guide Note  09/18/2023 Name: [name] [name] MRN: [id number] DOB: 1951/11/28  Referred by: Erasmo Downer, MD Reason for referral : Care Coordination (Outreach to reschedule with Pharm d (raven))   [name] [name] is a 72 y.o. year old female who is a primary care patient of Beryle Flock, Marzella Schlein, MD. Delton Prairie was referred to the pharmacist for assistance related to DM.    Successful contact was made with the patient to discuss pharmacy services. Patient declines engagement at this time. Contact information was provided to the patient should they wish to reach out for assistance at a later time.  Penne Lash, RMA Care Guide [facility]  [place], [place] [address] Direct Dial: [phone] [email or web address]

## 2023-10-02 ENCOUNTER — Ambulatory Visit: Admission: RE | Admit: 2023-10-02 | Payer: 59 | Source: Home / Self Care | Admitting: Gastroenterology

## 2023-10-02 ENCOUNTER — Encounter: Admission: RE | Payer: Self-pay | Source: Home / Self Care

## 2023-10-02 SURGERY — COLONOSCOPY WITH PROPOFOL
Anesthesia: General

## 2023-10-07 ENCOUNTER — Other Ambulatory Visit: Payer: Self-pay | Admitting: Family Medicine

## 2023-10-08 NOTE — Telephone Encounter (Signed)
Requested medication (s) are due for refill today: Due 08/16/23  Requested medication (s) are on the active medication list: yes    Last refill: 05/16/23  #90  2 refills  Future visit scheduled yes 01/02/24  Notes to clinic:Not delegated, please review.  Requested Prescriptions  Pending Prescriptions Disp Refills   pregabalin (LYRICA) 75 MG capsule [Pharmacy Med Name: PREGABALIN 75 MG CAPS 75 Capsule] 90 capsule 2    Sig: TAKE ONE CAPSULE BY MOUTH THREE TIMES DAILY     Not Delegated - Neurology:  Anticonvulsants - Controlled - pregabalin Failed - 10/07/2023  6:47 PM      Failed - This refill cannot be delegated      Passed - Cr in normal range and within 360 days    Creatinine  Date Value Ref Range Status  08/31/2014 0.63 0.60 - 1.30 mg/dL Final   Creatinine, Ser  Date Value Ref Range Status  07/01/2023 0.83 0.57 - 1.00 mg/dL Final         Passed - Completed PHQ-2 or PHQ-9 in the last 360 days      Passed - Valid encounter within last 12 months    Recent Outpatient Visits           2 months ago Olecranon bursitis of left elbow   [place] [place], Marzella Schlein, MD   3 months ago Encounter for annual physical exam   [place] [facility] [place], Marzella Schlein, MD   6 months ago Cellulitis of left upper extremity   [place] [facility] [place], Marzella Schlein, MD   7 months ago Type 2 diabetes mellitus with complication, without long-term current use of insulin [facility]   [place] [facility] [place], Marzella Schlein, MD   7 months ago Hypertension associated with diabetes [facility])    [place] [facility] Bacigalupo, Marzella Schlein, MD       Future Appointments             In 2 months Bacigalupo, Marzella Schlein, MD [facility], [facility]

## 2023-12-12 ENCOUNTER — Other Ambulatory Visit: Payer: Self-pay | Admitting: Family Medicine

## 2023-12-16 ENCOUNTER — Other Ambulatory Visit: Payer: Self-pay | Admitting: Family Medicine

## 2023-12-16 NOTE — Telephone Encounter (Signed)
 Medication Refill -  Most Recent Primary Care Visit:  Provider: MYRLA JON HERO  Department: [facility]  Visit Type: OFFICE VISIT  Date: 08/08/2023  Medication: tirzepatide  (MOUNJARO ) 5 MG/0.5ML Pen   Has the patient contacted their pharmacy? Yes  Is this the correct pharmacy for this prescription? Yes If no, delete pharmacy and type the correct one.  This is the patient's preferred pharmacy[id number] [place], [place] - [name] [place] [address] Phone: [phone] Fax: [phone]  Has the prescription been filled recently? Yes  Is the patient out of the medication? Yes  Has the patient been seen for an appointment in the last year OR does the patient have an upcoming appointment? Yes  Can we respond through MyChart? Yes  Agent: Please be advised that Rx refills may take up to 3 business days. We ask that you follow-up with your pharmacy.

## 2023-12-16 NOTE — Telephone Encounter (Signed)
 Requested medication (s) are due for refill today: yes  Requested medication (s) are on the active medication list: yes  Last refill:  02/14/23  Future visit scheduled: yes  Notes to clinic:   Medication not assigned to a protocol, review manually      Requested Prescriptions  Pending Prescriptions Disp Refills   MOUNJARO  2.5 MG/0.5ML Pen [Pharmacy Med Name: MOUNJARO  2.5 MG/0.5 ML PEN]      Sig: INJECT 2.5 MG INTO THE SKIN ONCE A WEEK FOR 28 DAYS.     Off-Protocol Failed - 12/16/2023 11:14 AM      Failed - Medication not assigned to a protocol, review manually.      Passed - Valid encounter within last 12 months    Recent Outpatient Visits           4 months ago Olecranon bursitis of left elbow   [place] [facility] [place], Jon HERO, MD   5 months ago Encounter for annual physical exam   [place] [facility] [place], Jon HERO, MD   9 months ago Cellulitis of left upper extremity   [place] [facility] [place], Jon HERO, MD   9 months ago Type 2 diabetes mellitus with complication, without long-term current use of insulin  [facility])   [place] [facility] [place], Jon HERO, MD   10 months ago Hypertension associated with diabetes [facility])    [facility] Bacigal[facility], Jon HERO, MD       Future Appointments             In 2 weeks Bacigalupo, Jon HERO, MD [facility], [facility]

## 2023-12-17 ENCOUNTER — Telehealth: Payer: Self-pay | Admitting: Family Medicine

## 2023-12-17 NOTE — Telephone Encounter (Signed)
 Pt advised rx available for pick up from pharmacy

## 2023-12-17 NOTE — Telephone Encounter (Signed)
 Requested medication (s) are due for refill today - no  Requested medication (s) are on the active medication list -yes  Future visit scheduled -yes  Last refill: 09/16/23 6ml 1RF  Notes to clinic: off protocol- provider review - too soon  Requested Prescriptions  Pending Prescriptions Disp Refills   tirzepatide  (MOUNJARO ) 5 MG/0.5ML Pen 6 mL 1    Sig: Inject 5 mg into the skin once a week.     Off-Protocol Failed - 12/17/2023 10:16 AM      Failed - Medication not assigned to a protocol, review manually.      Passed - Valid encounter within last 12 months    Recent Outpatient Visits           4 months ago Olecranon bursitis of left elbow   [place] [facility], Jon HERO, MD   5 months ago Encounter for annual physical exam   [place] [facility] [place], Jon HERO, MD   9 months ago Cellulitis of left upper extremity   [place] [facility] [place], Jon HERO, MD   9 months ago Type 2 diabetes mellitus with complication, without long-term current use of insulin  [facility])   [place] [facility] [place], Jon HERO, MD   10 months ago Hypertension associated with diabetes [facility])   [place] [facility] Bacigalupo, Jon HERO, MD       Future Appointments             In 2 weeks Bacigalupo, Jon HERO, MD [facility], [facility]               Requested Prescriptions  Pending Prescriptions Disp Refills   tirzepatide  (MOUNJARO ) 5 MG/0.5ML Pen 6 mL 1    Sig: Inject 5 mg into the skin once a week.     Off-Protocol Failed - 12/17/2023 10:16 AM      Failed - Medication not assigned to a protocol, review manually.      Passed - Valid encounter within last 12 months    Recent Outpatient Visits           4 months ago Olecranon bursitis of left elbow   [place] [facility] [place], Jon HERO, MD   5 months ago Encounter for annual  physical exam   [place] [facility] [place], Jon HERO, MD   9 months ago Cellulitis of left upper extremity   [place] [facility] [place], Jon HERO, MD   9 months ago Type 2 diabetes mellitus with complication, without long-term current use of insulin  [facility])   [place] [facility] Jon HERO, MD   10 months ago Hypertension associated with diabetes [facility])   [place] [facility] Bacigalupo, Jon HERO, MD       Future Appointments             In 2 weeks Bacigalupo, Jon HERO, MD [facility], [facility]

## 2023-12-17 NOTE — Telephone Encounter (Signed)
   Could someone from Clinical follow up with patient to give her an update on the following medication.       Requested Prescriptions  Pending Prescriptions Disp Refills    tirzepatide  (MOUNJARO ) 5 MG/0.5ML Pen 6 mL 1      Sig: Inject 5 mg into the skin once a week.       Off-Protocol Failed - 12/17/2023 10:16 AM         Failed - Medication not assigned to a protocol, review manually.         Passed - Valid encounter within last 12 months      Recent Outpatient Visits

## 2023-12-17 NOTE — Telephone Encounter (Signed)
 What question/update does she need?

## 2023-12-18 ENCOUNTER — Other Ambulatory Visit: Payer: Self-pay | Admitting: Family Medicine

## 2023-12-18 DIAGNOSIS — E1169 Type 2 diabetes mellitus with other specified complication: Secondary | ICD-10-CM

## 2023-12-19 NOTE — Telephone Encounter (Signed)
Requested Prescriptions  Pending Prescriptions Disp Refills   ezetimibe (ZETIA) 10 MG tablet [Pharmacy Med Name: EZETIMIBE 10 MG TABLET] 90 tablet 1    Sig: TAKE 1 TABLET BY MOUTH EVERY DAY     Cardiovascular:  Antilipid - Sterol Transport Inhibitors Failed - 12/19/2023  9:35 AM      Failed - Lipid Panel in normal range within the last 12 months    Cholesterol, Total  Date Value Ref Range Status  07/01/2023 245 (H) 100 - 199 mg/dL Final   LDL Chol Calc (NIH)  Date Value Ref Range Status  07/01/2023 164 (H) 0 - 99 mg/dL Final   Direct LDL  Date Value Ref Range Status  12/13/2016 183.0 mg/dL Final    Comment:    Optimal:  <100 mg/dLNear or Above Optimal:  100-129 mg/dLBorderline High:  130-159 mg/dLHigh:  160-189 mg/dLVery High:  >190 mg/dL   HDL  Date Value Ref Range Status  07/01/2023 45 >39 mg/dL Final   Triglycerides  Date Value Ref Range Status  07/01/2023 197 (H) 0 - 149 mg/dL Final         Passed - AST in normal range and within 360 days    AST  Date Value Ref Range Status  07/01/2023 17 0 - 40 IU/L Final         Passed - ALT in normal range and within 360 days    ALT  Date Value Ref Range Status  07/01/2023 8 0 - 32 IU/L Final         Passed - Patient is not pregnant      Passed - Valid encounter within last 12 months    Recent Outpatient Visits           4 months ago Olecranon bursitis of left elbow   [place] [facility] [place], Marzella Schlein, MD   5 months ago Encounter for annual physical exam   [place] [facility] [place], Marzella Schlein, MD   9 months ago Cellulitis of left upper extremity   [place] [facility] [place], Marzella Schlein, MD   9 months ago Type 2 diabetes mellitus with complication, without long-term current use of insulin [facility])   [place] [facility] [place], Marzella Schlein, MD   10 months ago Hypertension associated with diabetes [facility])   [place] [facility] [place], Marzella Schlein, MD       Future Appointments             In 2 weeks [place], Marzella Schlein, MD [facility], [facility]

## 2024-01-02 ENCOUNTER — Ambulatory Visit: Payer: 59 | Admitting: Family Medicine

## 2024-01-07 ENCOUNTER — Other Ambulatory Visit: Payer: Self-pay | Admitting: Family Medicine

## 2024-01-07 ENCOUNTER — Telehealth: Payer: Self-pay

## 2024-01-07 NOTE — Telephone Encounter (Signed)
Pt AWV was cancelled and rescheduled

## 2024-01-07 NOTE — Telephone Encounter (Signed)
 Copied from CRM [id number]. Topic: General - Call Back - No Documentation >> Jan 02, 2024  4:28 PM Tobias L wrote: Reason for CRM: Pt calling back, states she received a call from office about 5 minutes ago. No documentation seen.   Please follow up with patient

## 2024-01-08 ENCOUNTER — Telehealth: Payer: Self-pay

## 2024-01-08 NOTE — Telephone Encounter (Signed)
 Copied from CRM [id number]. Topic: General - Other >> Jan 07, 2024  3:53 PM Tobias L wrote: Reason for CRM: Darice calling to speak to nurse. Noticed patient may possibly have diabetes but may not be on astatin per the current ada guideline; an ACC/AHA Cholesterol guideline recommendations. Please ask provider to consider re-evaluating therapy for patient and prescribe astatin, if clinically appropriate. If provider thinks appropriate, please send new prescription to patient's pharmacy as soon as possible so this open care opportunity can be closed before end of year. If patient does not tolerate or meet exclusion criteria for astatin use please document the appropriate ICD 10 dx code in their record. Will send fax confirming details and nurse will call in few days following up. Call back number [phone]

## 2024-01-09 NOTE — Telephone Encounter (Signed)
 Has statin intolerance. M79.10 on problem list. On Zetia  instead.

## 2024-01-09 NOTE — Telephone Encounter (Signed)
 Call returned. Advised per provider. Agent verbalized understanding

## 2024-02-05 ENCOUNTER — Other Ambulatory Visit: Payer: Self-pay | Admitting: Family Medicine

## 2024-03-05 ENCOUNTER — Other Ambulatory Visit: Payer: Self-pay | Admitting: Family Medicine

## 2024-03-06 NOTE — Telephone Encounter (Signed)
 Requested Prescriptions  Pending Prescriptions Disp Refills   DULoxetine (CYMBALTA) 60 MG capsule [Pharmacy Med Name: DULOXETINE DR 60MG  CAP 60 Capsule] 90 capsule 0    Sig: TAKE 1 CAPSULE BY MOUTH DAILY     Psychiatry: Antidepressants - SNRI - duloxetine Failed - 03/06/2024  1:50 PM      Failed - Valid encounter within last 6 months    Recent Outpatient Visits   None            Passed - Cr in normal range and within 360 days    Creatinine  Date Value Ref Range Status  08/31/2014 0.63 0.60 - 1.30 mg/dL Final   Creatinine, Ser  Date Value Ref Range Status  07/01/2023 0.83 0.57 - 1.00 mg/dL Final         Passed - eGFR is 30 or above and within 360 days    EGFR (African American)  Date Value Ref Range Status  08/31/2014 >60 >57mL/min Final  11/05/2013 >60  Final   GFR calc Af Amer  Date Value Ref Range Status  12/21/2020 72 >59 mL/min/1.73 Final    Comment:    **In accordance with recommendations from the NKF-ASN Task force,**   [facility] is in the process of updating its eGFR calculation to the   2021 CKD-EPI creatinine equation that estimates kidney function   without a race variable.    EGFR (Non-African Amer.)  Date Value Ref Range Status  08/31/2014 >60 >69mL/min Final    Comment:    eGFR values <22mL/min/1.73 m2 may be an indication of chronic kidney disease (CKD). Calculated eGFR, using the MRDR Study equation, is useful in  patients with stable renal function. The eGFR calculation will not be reliable in acutely ill patients when serum creatinine is changing rapidly. It is not useful in patients on dialysis. The eGFR calculation may not be applicable to patients at the low and high extremes of body sizes, pregnant women, and vegetarians.   11/05/2013 >60  Final    Comment:    eGFR values <60mL/min/1.73 m2 may be an indication of chronic kidney disease (CKD). Calculated eGFR is useful in patients with stable renal function. The eGFR calculation will not be  reliable in acutely ill patients when serum creatinine is changing rapidly. It is not useful in  patients on dialysis. The eGFR calculation may not be applicable to patients at the low and high extremes of body sizes, pregnant women, and vegetarians.    GFR, Estimated  Date Value Ref Range Status  04/05/2022 >60 >60 mL/min Final    Comment:    (NOTE) Calculated using the CKD-EPI Creatinine Equation (2021)    GFR  Date Value Ref Range Status  12/13/2016 113.78 >60.00 mL/min Final   eGFR  Date Value Ref Range Status  07/01/2023 76 >59 mL/min/1.73 Final         Passed - Completed PHQ-2 or PHQ-9 in the last 360 days      Passed - Last BP in normal range    BP Readings from Last 1 Encounters:  08/21/23 138/80          metFORMIN (GLUCOPHAGE) 500 MG tablet [Pharmacy Med Name: METFORMIN 500 MG TABS 500 Tablet] 180 tablet 0    Sig: TAKE ONE (1) TABLET BY MOUTH TWICE DAILY     Endocrinology:  Diabetes - Biguanides Failed - 03/06/2024  1:50 PM      Failed - HBA1C is between 0 and 7.9 and within 180 days  Hgb A1c MFr Bld  Date Value Ref Range Status  07/01/2023 5.8 (H) 4.8 - 5.6 % Final    Comment:             Prediabetes: 5.7 - 6.4          Diabetes: >6.4          Glycemic control for adults with diabetes: <7.0          Failed - Valid encounter within last 6 months    Recent Outpatient Visits   None            Failed - CBC within normal limits and completed in the last 12 months    WBC  Date Value Ref Range Status  07/01/2023 5.6 3.4 - 10.8 x10E3/uL Final  04/05/2022 5.6 4.0 - 10.5 K/uL Final   RBC  Date Value Ref Range Status  07/01/2023 4.63 3.77 - 5.28 x10E6/uL Final  04/05/2022 4.04 3.87 - 5.11 MIL/uL Final   Hemoglobin  Date Value Ref Range Status  07/01/2023 11.6 11.1 - 15.9 g/dL Final   Hematocrit  Date Value Ref Range Status  07/01/2023 36.9 34.0 - 46.6 % Final   MCHC  Date Value Ref Range Status  07/01/2023 31.4 (L) 31.5 - 35.7 g/dL Final   16/09/9603 54.0 (L) 30.0 - 36.0 g/dL Final   [facility]  Date Value Ref Range Status  07/01/2023 25.1 (L) 26.6 - 33.0 pg Final  04/05/2022 23.8 (L) 26.0 - 34.0 pg Final   MCV  Date Value Ref Range Status  07/01/2023 80 79 - 97 fL Final  08/31/2014 86 80 - 100 fL Final   No results found for: "PLTCOUNTKUC", "LABPLAT", "POCPLA" RDW  Date Value Ref Range Status  07/01/2023 16.0 (H) 11.7 - 15.4 % Final  08/31/2014 16.3 (H) 11.5 - 14.5 % Final         Passed - Cr in normal range and within 360 days    Creatinine  Date Value Ref Range Status  08/31/2014 0.63 0.60 - 1.30 mg/dL Final   Creatinine, Ser  Date Value Ref Range Status  07/01/2023 0.83 0.57 - 1.00 mg/dL Final         Passed - eGFR in normal range and within 360 days    EGFR (African American)  Date Value Ref Range Status  08/31/2014 >60 >26mL/min Final  11/05/2013 >60  Final   GFR calc Af Amer  Date Value Ref Range Status  12/21/2020 72 >59 mL/min/1.73 Final    Comment:    **In accordance with recommendations from the NKF-ASN Task force,**   [facility] is in the process of updating its eGFR calculation to the   2021 CKD-EPI creatinine equation that estimates kidney function   without a race variable.    EGFR (Non-African Amer.)  Date Value Ref Range Status  08/31/2014 >60 >95mL/min Final    Comment:    eGFR values <64mL/min/1.73 m2 may be an indication of chronic kidney disease (CKD). Calculated eGFR, using the MRDR Study equation, is useful in  patients with stable renal function. The eGFR calculation will not be reliable in acutely ill patients when serum creatinine is changing rapidly. It is not useful in patients on dialysis. The eGFR calculation may not be applicable to patients at the low and high extremes of body sizes, pregnant women, and vegetarians.   11/05/2013 >60  Final    Comment:    eGFR values <54mL/min/1.73 m2 may be an indication of chronic kidney disease (CKD). Calculated  eGFR is useful in  patients with stable renal function. The eGFR calculation will not be reliable in acutely ill patients when serum creatinine is changing rapidly. It is not useful in  patients on dialysis. The eGFR calculation may not be applicable to patients at the low and high extremes of body sizes, pregnant women, and vegetarians.    GFR, Estimated  Date Value Ref Range Status  04/05/2022 >60 >60 mL/min Final    Comment:    (NOTE) Calculated using the CKD-EPI Creatinine Equation (2021)    GFR  Date Value Ref Range Status  12/13/2016 113.78 >60.00 mL/min Final   eGFR  Date Value Ref Range Status  07/01/2023 76 >59 mL/min/1.73 Final         Passed - B12 Level in normal range and within 720 days    Vitamin B-12  Date Value Ref Range Status  07/17/2022 429 232 - 1,245 pg/mL Final          Patient must keep upcoming office visit for additional refills.

## 2024-03-10 ENCOUNTER — Other Ambulatory Visit: Payer: Self-pay | Admitting: Family Medicine

## 2024-04-03 ENCOUNTER — Other Ambulatory Visit: Payer: Self-pay | Admitting: Family Medicine

## 2024-04-24 ENCOUNTER — Other Ambulatory Visit: Payer: Self-pay

## 2024-04-24 MED ORDER — TOLTERODINE TARTRATE ER 4 MG PO CP24: 4.0000 mg | ORAL_CAPSULE | Freq: Every day | ORAL | 0 refills | Status: DC

## 2024-05-11 ENCOUNTER — Other Ambulatory Visit: Payer: Self-pay | Admitting: Family Medicine

## 2024-05-11 NOTE — Telephone Encounter (Signed)
 Copied from CRM [id number]. Topic: Clinical - Medication Refill >> May 11, 2024 10:07 AM Chasity T wrote: Medication: Mounjaro  tirzepatide  (MOUNJARO ) 5 MG/0.5ML Pen   Has the patient contacted their pharmacy? Yes (Agent: If no, request that the patient contact the pharmacy for the refill. If patient does not wish to contact the pharmacy document the reason why and proceed with request.) (Agent: If yes, when and what did the pharmacy advise?)  This is the patient's preferred pharmacy:    [id number] [place], [place] - [address] [place] [address] Phone: [phone] Fax: [phone]  Is this the correct pharmacy for this prescription? Yes If no, delete pharmacy and type the correct one.   Has the prescription been filled recently? Yes  Is the patient out of the medication? Yes  Has the patient been seen for an appointment in the last year OR does the patient have an upcoming appointment? Yes  Can we respond through MyChart? Yes  Agent: Please be advised that Rx refills may take up to 3 business days. We ask that you follow-up with your pharmacy.

## 2024-06-03 ENCOUNTER — Other Ambulatory Visit: Payer: Self-pay | Admitting: Family Medicine

## 2024-06-03 ENCOUNTER — Ambulatory Visit: Payer: 59

## 2024-06-03 DIAGNOSIS — Z0000 Encounter for general adult medical examination without abnormal findings: Secondary | ICD-10-CM

## 2024-06-03 DIAGNOSIS — Z780 Asymptomatic menopausal state: Secondary | ICD-10-CM

## 2024-06-03 DIAGNOSIS — Z1211 Encounter for screening for malignant neoplasm of colon: Secondary | ICD-10-CM

## 2024-06-03 DIAGNOSIS — Z1231 Encounter for screening mammogram for malignant neoplasm of breast: Secondary | ICD-10-CM

## 2024-06-03 NOTE — Patient Instructions (Addendum)
 [name] [name] , Thank you for taking time out of your busy schedule to complete your Annual Wellness Visit with me. I enjoyed our conversation and look forward to speaking with you again next year. I, as well as your care team,  appreciate your ongoing commitment to your health goals. Please review the following plan we discussed and let me know if I can assist you in the future.    REFERRALS SENT FOR MAMMOGRAM, COLONOSCOPY & BDS   You have an order for:  []   2D Mammogram  [x]   3D Mammogram  [x]   Bone Density     Please call for appointment:  [facility]  [address] [place] [address] [phone] [facility] [address] [place], [place] [address] [phone] [facility] [address] [place], [place] [address] [phone]   Make sure to wear two-piece clothing.  No lotions, powders, or deodorants the day of the appointment. Make sure to bring picture ID and insurance card.  Bring list of medications you are currently taking including any supplements.   Schedule your [place] screening mammogram through MyChart!   Log into your MyChart account.  Go to 'Visit' (or 'Appointments' if on mobile App) --> Schedule an Appointment  Under 'Select a Reason for Visit' choose the Mammogram Screening option.  Complete the pre-visit questions and select the time and place that best fits your schedule.    Follow up Visits: Next Medicare AWV with our clinical staff:   06/09/25 @ 1:50 PM BY PHONE Have you seen your provider in the last 6 months (3 months if uncontrolled diabetes)? Yes   Clinician Recommendations:  Aim for 30 minutes of exercise or brisk walking, 6-8 glasses of water, and 5 servings of fruits and vegetables each day. TAKE CARE!      This is a list of the screening recommended for you and due dates:  Health Maintenance  Topic Date Due    DEXA scan (bone density measurement)  Never done   Mammogram  Never done   DTaP/Tdap/Td vaccine (1 - Tdap) Never done   Colon Cancer Screening  Never done   Zoster (Shingles) Vaccine (1 of 2) Never done   Pneumococcal Vaccine for age over 35 (2 of 2 - PCV) 12/13/2017   COVID-19 Vaccine (1 - 2024-25 season) Never done   Hemoglobin A1C  01/01/2024   Eye exam for diabetics  03/19/2024   Yearly kidney function blood test for diabetes  06/30/2024   Yearly kidney health urinalysis for diabetes  06/30/2024   Complete foot exam   06/30/2024   Flu Shot  07/03/2024   Medicare Annual Wellness Visit  06/03/2025   Hepatitis C Screening  Completed   Hepatitis B Vaccine  Aged Out   HPV Vaccine  Aged Out   Meningitis B Vaccine  Aged Out    Advanced directives: (ACP Link)Information on Advanced Care Planning can be found at Education officer, museum Health Care Directives Advance Health Care Directives. [email or web address]  Advance Care Planning is important because it:  [x]  Makes sure you receive the medical care that is consistent with your values, goals, and preferences  [x]  It provides guidance to your family and loved ones and reduces their decisional burden about whether or not they are making the right decisions based on your wishes.  Follow the link provided in your after visit  summary or read over the paperwork we have mailed to you to help you started getting your Advance Directives in place. If you need assistance in completing these, please reach out to us  so that we can help you!

## 2024-06-03 NOTE — Progress Notes (Signed)
 Subjective:   [name] [name] is a 73 y.o. who presents for a Medicare Wellness preventive visit.  As a reminder, Annual Wellness Visits don't include a physical exam, and some assessments may be limited, especially if this visit is performed virtually. We may recommend an in-person follow-up visit with your provider if needed.  Visit Complete: Virtual I connected with  [name] on 06/03/24 by a audio enabled telemedicine application and verified that I am speaking with the correct person using two identifiers.  Patient Location: Home  Provider Location: Home Office  I discussed the limitations of evaluation and management by telemedicine. The patient expressed understanding and agreed to proceed.  Vital Signs: Because this visit was a virtual/telehealth visit, some criteria may be missing or patient reported. Any vitals not documented were not able to be obtained and vitals that have been documented are patient reported.  VideoDeclined- This patient declined Librarian, academic. Therefore the visit was completed with audio only.  Persons Participating in Visit: Patient.  AWV Questionnaire: No: Patient Medicare AWV questionnaire was not completed prior to this visit.  Cardiac Risk Factors include: advanced age (>29men, >69 women);diabetes mellitus;dyslipidemia;hypertension;obesity (BMI >30kg/m2);sedentary lifestyle     Objective:    There were no vitals filed for this visit. There is no height or weight on file to calculate BMI.     06/03/2024   12:43 PM 06/27/2023   10:58 AM 06/13/2023    6:30 AM 05/29/2023    9:08 AM 03/13/2022    4:53 PM 03/02/2022    4:13 PM 02/17/2022   11:52 PM  Advanced Directives  Does Patient Have a Medical Advance Directive? No Yes Yes Yes No No No  Type of Advance Directive  Living will Living will Healthcare Power of [place];Living will     Does patient want to make changes to medical advance directive?   No - Patient  declined      Copy of Healthcare Power of Attorney in Chart?  No - copy requested       Would patient like information on creating a medical advance directive? No - Patient declined    No - Patient declined No - Patient declined No - Patient declined    Current Medications (verified) Outpatient Encounter Medications as of 06/03/2024  Medication Sig   acetaminophen  (TYLENOL ) 650 MG CR tablet Take 650 mg by mouth every 8 (eight) hours as needed for pain.   Biotin  w/ Vitamins C & E (HAIR/SKIN/NAILS PO) Take 1 tablet by mouth daily.   Brexpiprazole  (REXULTI ) 0.5 MG TABS Take 1 tablet (0.5 mg total) by mouth daily.   buPROPion  (WELLBUTRIN  XL) 300 MG 24 hr tablet TAKE ONE TABLET BY MOUTH DAILY   DULoxetine  (CYMBALTA ) 60 MG capsule TAKE 1 CAPSULE BY MOUTH DAILY   ketoconazole  (NIZORAL ) 2 % shampoo APPLY 1 APPLICATION TOPICALLY 2 (TWO) TIMES A WEEK.   losartan  (COZAAR ) 25 MG tablet Take 0.5 tablets (12.5 mg total) by mouth daily.   metFORMIN  (GLUCOPHAGE ) 500 MG tablet TAKE ONE (1) TABLET BY MOUTH TWICE DAILY   oxybutynin  (DITROPAN -XL) 10 MG 24 hr tablet Take 10 mg by mouth at bedtime.   pregabalin  (LYRICA ) 75 MG capsule TAKE 1 CAPSULE BY MOUTH 3 TIMES DAILY   tirzepatide  (MOUNJARO ) 5 MG/0.5ML Pen INJECT 5 MG SUBCUTANEOUSLY WEEKLY   tolterodine  (DETROL  LA) 4 MG 24 hr capsule Take 1 capsule (4 mg total) by mouth daily.   ezetimibe  (ZETIA ) 10 MG tablet TAKE 1 TABLET BY  MOUTH EVERY DAY (Patient not taking: Reported on 06/03/2024)   meloxicam  (MOBIC ) 15 MG tablet TAKE 1 TABLET (15 MG TOTAL) BY MOUTH DAILY. (Patient not taking: Reported on 06/03/2024)   polyethylene glycol (MIRALAX  / GLYCOLAX ) 17 g packet Take 17 g by mouth daily as needed for mild constipation. (Patient not taking: Reported on 06/03/2024)   No facility-administered encounter medications on file as of 06/03/2024.    Allergies (verified) Sulfa antibiotics, Latex, Doxycycline, and Empagliflozin    History: Past Medical History:  Diagnosis Date    Anxiety    Arthritis    Rhumetoid arthritis   BRCA negative 07/2017   MyRisk neg   Cancer (HCC)    uterine   Cellulitis of both lower extremities    Chronic   CHF (congestive heart failure) (HCC)    Coronary artery disease    COVID-19 11/2020   Depression    Diabetes mellitus without complication (HCC)    Family history of breast cancer 07/2017   MyRisk neg; IBIS=10.5%/riskscore=17%   Family history of ovarian cancer    Fibromyalgia    Fibromyalgia affecting shoulder region    Headache    Hyperlipidemia    Hypertension    Spinal stenosis    Torn rotator cuff    bilateral   Wears dentures    full upper   Past Surgical History:  Procedure Laterality Date   ABLATION SAPHENOUS VEIN W/ RFA     ANAL FISSURECTOMY  01/2004   CATARACT EXTRACTION W/PHACO Left 06/13/2023   Procedure: CATARACT EXTRACTION PHACO AND INTRAOCULAR LENS PLACEMENT (IOC) LEFT DIABETIC  5.79  00:42.7;  Surgeon: Enola Feliciano Hugger, MD;  Location: [facility];  Service: Ophthalmology;  Laterality: Left;   CATARACT EXTRACTION W/PHACO Right 06/27/2023   Procedure: CATARACT EXTRACTION PHACO AND INTRAOCULAR LENS PLACEMENT (IOC) RIGHT DIABETIC 4.21 [id number];  Surgeon: Enola Feliciano Hugger, MD;  Location: [facility];  Service: Ophthalmology;  Laterality: Right;   COLON SURGERY     CORONARY ANGIOPLASTY     CYSTOSCOPY  09/03/2017   Procedure: CYSTOSCOPY;  Surgeon: Arloa Lamar SQUIBB, MD;  Location: [facility] ORS;  Service: Gynecology;;   DILATION AND CURETTAGE OF UTERUS     ESOPHAGEAL DILATION  02/20/2022   Procedure: ESOPHAGEAL DILATION;  Surgeon: Rollin Dover, MD;  Location: [facility];  Service: Gastroenterology;;  savory   ESOPHAGOGASTRODUODENOSCOPY (EGD) WITH PROPOFOL  N/A 02/20/2022   Procedure: ESOPHAGOGASTRODUODENOSCOPY (EGD) WITH PROPOFOL ;  Surgeon: Rollin Dover, MD;  Location: [facility] ENDOSCOPY;  Service: Gastroenterology;  Laterality: N/A;  Dyspahgia   EYE SURGERY Bilateral 08/2020   eye  lift   HERNIA REPAIR     INCISION AND DRAINAGE OF WOUND  03/04/2022   Procedure: IRRIGATION AND DEBRIDEMENT ABDOMEN WITH VAC PLACEMENT, MYRIAD PLACEMENT;  Surgeon: Lowery Estefana RAMAN, DO;  Location: [facility] OR;  Service: Plastics;;   LAPAROSCOPIC HYSTERECTOMY N/A 09/03/2017   Procedure: HYSTERECTOMY TOTAL LAPAROSCOPIC BSO;  Surgeon: Arloa Lamar SQUIBB, MD;  Location: [facility] ORS;  Service: Gynecology;  Laterality: N/A;   MEDIAL PARTIAL KNEE REPLACEMENT Left 01/06/2014   PANNICULECTOMY N/A 02/05/2022   Procedure: PANNICULECTOMY;  Surgeon: Lowery Estefana RAMAN, DO;  Location: [facility] OR;  Service: Plastics;  Laterality: N/A;  3 hours   total arthroplasty of left shoulder Left 05/04/2023   Dr. Leora   UMBILICAL HERNIA REPAIR N/A 09/05/2015   Procedure: HERNIA REPAIR incarcerated UMBILICAL ADULT;  Surgeon: Oneil Chang, MD;  Location: [facility] ORS;  Service: General;  Laterality: N/A;   Vascular Stent  11/04/2013   Family History  Problem Relation Age of Onset   Hypertension Mother    Cataracts Mother    Thyroid  disease Mother    Dementia Mother    COPD Father    Dementia Father    Cataracts Father    Aneurysm Father        Abdominal & Brain   Diabetes Father    Melanoma Father    Breast cancer Sister 11   Fibromyalgia Sister    Migraines Sister    Hypertension Sister    Breast cancer Sister 25   COPD Sister    Ovarian cancer Sister 85   Migraines Brother    Heart attack Brother    Colon cancer Paternal Uncle        60s   Colon cancer Paternal Uncle        21s   Uterine cancer Cousin    Uterine cancer Cousin    Social History   Socioeconomic History   Marital status: Widowed    Spouse name: Alm   Number of children: 1   Years of education: 9th Grade   Highest education level: 9th grade  Occupational History   Occupation: Disabled    Comment: no longer receiving   Occupation: on SS  Tobacco Use   Smoking status: Former    Current packs/day: 0.00    Average packs/day: 1 pack/day  for 29.0 years (29.0 ttl pk-yrs)    Types: Cigarettes    Start date: 12/04/1967    Quit date: 12/03/1996    Years since quitting: 27.5    Passive exposure: Past   Smokeless tobacco: Never  Vaping Use   Vaping status: Never Used  Substance and Sexual Activity   Alcohol use: No   Drug use: No   Sexual activity: Not Currently  Other Topics Concern   Not on file  Social History Narrative   Not on file   Social Drivers of Health   Financial Resource Strain: Low Risk  (06/03/2024)   Overall Financial Resource Strain (CARDIA)    Difficulty of Paying Living Expenses: Not very hard  Food Insecurity: No Food Insecurity (06/03/2024)   Hunger Vital Sign    Worried About Running Out of Food in the Last Year: Never true    Ran Out of Food in the Last Year: Never true  Transportation Needs: No Transportation Needs (06/03/2024)   PRAPARE - Administrator, Civil Service (Medical): No    Lack of Transportation (Non-Medical): No  Physical Activity: Inactive (06/03/2024)   Exercise Vital Sign    Days of Exercise per Week: 0 days    Minutes of Exercise per Session: 0 min  Stress: No Stress Concern Present (06/03/2024)   [employer] of Occupational Health - Occupational Stress Questionnaire    Feeling of Stress: Only a little  Social Connections: Socially Isolated (06/03/2024)   Social Connection and Isolation Panel    Frequency of Communication with Friends and Family: More than three times a week    Frequency of Social Gatherings with Friends and Family: Never    Attends Religious Services: Never    Database administrator or Organizations: No    Attends Banker Meetings: Never    Marital Status: Widowed    Tobacco Counseling Counseling given: Not Answered    Clinical Intake:  Pre-visit preparation completed: Yes  Pain : No/denies pain     BMI - recorded: 37.3 Nutritional Status: BMI > 30  Obese Nutritional Risks: None Diabetes: Yes CBG done?:  No Did pt.  bring in CBG monitor from home?: No  Lab Results  Component Value Date   HGBA1C 5.8 (H) 07/01/2023   HGBA1C 7.4 (A) 02/14/2023   HGBA1C 7.4 (H) 03/03/2022     How often do you need to have someone help you when you read instructions, pamphlets, or other written materials from your doctor or pharmacy?: 1 - Never  Interpreter Needed?: No  Information entered by :: JHONNIE DAS, LPN   Activities of Daily Living    06/03/2024   12:45 PM 07/01/2023    1:59 PM  In your present state of health, do you have any difficulty performing the following activities:  Hearing? 0 0  Vision? 0 0  Difficulty concentrating or making decisions? 1 0  Comment REMEMBERING   Walking or climbing stairs? 0 0  Comment AS LONG AS HAS RAILS   Dressing or bathing? 0 0  Doing errands, shopping? 0 0  Preparing Food and eating ? N   Using the Toilet? N   In the past six months, have you accidently leaked urine? Y   Do you have problems with loss of bowel control? N   Managing your Medications? N   Managing your Finances? N   Housekeeping or managing your Housekeeping? N     Patient Care Team: Myrla Jon HERO, MD as PCP - General (Family Medicine) Gaston Hamilton, MD as Consulting Physician (Urology) Christobal Selinda HERO, OD (Optometry) Dillingham, Estefana RAMAN, DO as Attending Physician (Plastic Surgery) Hester Wolm PARAS, MD as Consulting Physician (Cardiology) Hester Wolm PARAS, MD as Consulting Physician (Cardiology)  I have updated your Care Teams any recent Medical Services you may have received from other providers in the past year.     Assessment:   This is a routine wellness examination for [place].  Hearing/Vision screen Hearing Screening - Comments:: NO AIDS Vision Screening - Comments:: WEARS GLASSES ALL DAY, HAD CATARACT SGY- NICE EYES   Goals Addressed             This Visit's Progress    DIET - EAT MORE FRUITS AND VEGETABLES         Depression Screen     06/03/2024   12:40 PM  08/08/2023   10:03 AM 07/01/2023    1:58 PM 05/29/2023    9:06 AM 03/21/2023    2:43 PM 02/14/2023   11:08 AM 02/01/2023   12:31 PM  PHQ 2/9 Scores  PHQ - 2 Score 1 0 0 0 0 1 0  PHQ- 9 Score 2 0 1  0 2     Fall Risk     06/03/2024   12:44 PM 08/08/2023   10:03 AM 07/01/2023    1:59 PM 05/29/2023    8:57 AM 03/21/2023    2:43 PM  Fall Risk   Falls in the past year? 1 1 1 1 1   Number falls in past yr: 1 1 0 0 0  Injury with Fall? 0 0 0 0 1  Risk for fall due to : History of fall(s) History of fall(s) History of fall(s) No Fall Risks History of fall(s)  Follow up Falls evaluation completed;Falls prevention discussed Falls evaluation completed Falls evaluation completed Education provided;Falls prevention discussed Falls evaluation completed    MEDICARE RISK AT HOME:  Medicare Risk at Home Any stairs in or around the home?: Yes If so, are there any without handrails?: No Home free of loose throw rugs in walkways, pet beds, electrical cords, etc?: Yes Adequate  lighting in your home to reduce risk of falls?: Yes Life alert?: No Use of a cane, walker or w/c?: No Grab bars in the bathroom?: Yes Shower chair or bench in shower?: Yes Elevated toilet seat or a handicapped toilet?: Yes  TIMED UP AND GO:  Was the test performed?  No  Cognitive Function: 6CIT completed    01/14/2023   10:45 AM  MMSE - Mini Mental State Exam  Orientation to time 5  Orientation to Place 5  Registration 3  Attention/ Calculation 4  Recall 3  Language- name 2 objects 2  Language- repeat 1  Language- follow 3 step command 3  Language- read & follow direction 1  Write a sentence 1  Copy design 1  Total score 29        06/03/2024   12:50 PM 05/29/2023    9:15 AM 04/19/2022   11:13 AM 02/02/2020   10:38 AM  6CIT Screen  What Year? 0 points 0 points 0 points 0 points  What month? 0 points 0 points 0 points 0 points  What time? 0 points 0 points 0 points 0 points  Count back from 20 0 points 0 points 0  points 0 points  Months in reverse 0 points 0 points 0 points 0 points  Repeat phrase 0 points 0 points 4 points 0 points  Total Score 0 points 0 points 4 points 0 points    Immunizations Immunization History  Administered Date(s) Administered   Influenza,inj,Quad PF,6+ Mos 09/06/2015, 12/13/2016   Pneumococcal Polysaccharide-23 12/13/2016    Screening Tests Health Maintenance  Topic Date Due   DEXA SCAN  Never done   MAMMOGRAM  Never done   DTaP/Tdap/Td (1 - Tdap) Never done   Colonoscopy  Never done   Zoster Vaccines- Shingrix (1 of 2) Never done   Pneumococcal Vaccine: 50+ Years (2 of 2 - PCV) 12/13/2017   COVID-19 Vaccine (1 - 2024-25 season) Never done   HEMOGLOBIN A1C  01/01/2024   OPHTHALMOLOGY EXAM  03/19/2024   Diabetic kidney evaluation - eGFR measurement  06/30/2024   Diabetic kidney evaluation - Urine ACR  06/30/2024   FOOT EXAM  06/30/2024   INFLUENZA VACCINE  07/03/2024   Medicare Annual Wellness (AWV)  06/03/2025   Hepatitis C Screening  Completed   Hepatitis B Vaccines  Aged Out   HPV VACCINES  Aged Out   Meningococcal B Vaccine  Aged Out    Health Maintenance  Health Maintenance Due  Topic Date Due   DEXA SCAN  Never done   MAMMOGRAM  Never done   DTaP/Tdap/Td (1 - Tdap) Never done   Colonoscopy  Never done   Zoster Vaccines- Shingrix (1 of 2) Never done   Pneumococcal Vaccine: 50+ Years (2 of 2 - PCV) 12/13/2017   COVID-19 Vaccine (1 - 2024-25 season) Never done   HEMOGLOBIN A1C  01/01/2024   OPHTHALMOLOGY EXAM  03/19/2024   Diabetic kidney evaluation - eGFR measurement  06/30/2024   Diabetic kidney evaluation - Urine ACR  06/30/2024   Health Maintenance Items Addressed: DEXA ordered, MAMMOGRAM ORDERED & COLONOSCOPY REFERRAL SENT; DECLINES ALL VACCINES   Additional Screening:  Vision Screening: Recommended annual ophthalmology exams for early detection of glaucoma and other disorders of the eye. Would you like a referral to an eye doctor?  No    Dental Screening: Recommended annual dental exams for proper oral hygiene  Community Resource Referral / Chronic Care Management: CRR required this visit?  No  CCM required this visit?  No   Plan:    I have personally reviewed and noted the following in the patient's chart:   Medical and social history Use of alcohol, tobacco or illicit drugs  Current medications and supplements including opioid prescriptions. Patient is not currently taking opioid prescriptions. Functional ability and status Nutritional status Physical activity Advanced directives List of other physicians Hospitalizations, surgeries, and ER visits in previous 12 months Vitals Screenings to include cognitive, depression, and falls Referrals and appointments  In addition, I have reviewed and discussed with patient certain preventive protocols, quality metrics, and best practice recommendations. A written personalized care plan for preventive services as well as general preventive health recommendations were provided to patient.   Jhonnie GORMAN Das, LPN   01/09/7973   After Visit Summary: (MyChart) Due to this being a telephonic visit, the after visit summary with patients personalized plan was offered to patient via MyChart   Notes: MAMMOGRAM & BDS ORDERED, REFERRAL SENT FOR COLONOSCOPY

## 2024-06-04 ENCOUNTER — Encounter: Payer: Self-pay | Admitting: Family Medicine

## 2024-06-04 ENCOUNTER — Ambulatory Visit: Admitting: Family Medicine

## 2024-06-04 VITALS — BP 121/79 | HR 97 | Ht 67.5 in | Wt 230.1 lb

## 2024-06-04 DIAGNOSIS — I152 Hypertension secondary to endocrine disorders: Secondary | ICD-10-CM

## 2024-06-04 DIAGNOSIS — D692 Other nonthrombocytopenic purpura: Secondary | ICD-10-CM

## 2024-06-04 DIAGNOSIS — E785 Hyperlipidemia, unspecified: Secondary | ICD-10-CM | POA: Diagnosis not present

## 2024-06-04 DIAGNOSIS — R5382 Chronic fatigue, unspecified: Secondary | ICD-10-CM | POA: Diagnosis not present

## 2024-06-04 DIAGNOSIS — F330 Major depressive disorder, recurrent, mild: Secondary | ICD-10-CM

## 2024-06-04 DIAGNOSIS — M542 Cervicalgia: Secondary | ICD-10-CM | POA: Diagnosis not present

## 2024-06-04 DIAGNOSIS — L659 Nonscarring hair loss, unspecified: Secondary | ICD-10-CM

## 2024-06-04 DIAGNOSIS — H8110 Benign paroxysmal vertigo, unspecified ear: Secondary | ICD-10-CM | POA: Diagnosis not present

## 2024-06-04 DIAGNOSIS — E118 Type 2 diabetes mellitus with unspecified complications: Secondary | ICD-10-CM | POA: Diagnosis not present

## 2024-06-04 DIAGNOSIS — E1159 Type 2 diabetes mellitus with other circulatory complications: Secondary | ICD-10-CM

## 2024-06-04 DIAGNOSIS — R413 Other amnesia: Secondary | ICD-10-CM | POA: Diagnosis not present

## 2024-06-04 DIAGNOSIS — F411 Generalized anxiety disorder: Secondary | ICD-10-CM

## 2024-06-04 DIAGNOSIS — I5022 Chronic systolic (congestive) heart failure: Secondary | ICD-10-CM

## 2024-06-04 DIAGNOSIS — E1169 Type 2 diabetes mellitus with other specified complication: Secondary | ICD-10-CM

## 2024-06-04 MED ORDER — TIRZEPATIDE 7.5 MG/0.5ML ~~LOC~~ SOAJ
7.5000 mg | SUBCUTANEOUS | 1 refills | Status: DC
Start: 2024-06-04 — End: 2024-08-07

## 2024-06-04 MED ORDER — MECLIZINE HCL 12.5 MG PO TABS: 12.5000 mg | ORAL_TABLET | Freq: Three times a day (TID) | ORAL | 0 refills | Status: DC | PRN

## 2024-06-04 NOTE — Assessment & Plan Note (Signed)
Chronic and stable  CTM

## 2024-06-04 NOTE — Assessment & Plan Note (Signed)
 Chronic and stable - f/b cardiology

## 2024-06-04 NOTE — Progress Notes (Signed)
 Established patient visit   Patient: [name] [name]   DOB: 05/27/1951   73 y.o. Female  MRN: [id number] Visit Date: 06/04/2024  Today's healthcare provider: Jon Eva, MD   Chief Complaint  Patient presents with   Fatigue    Patient reports fatigue not alleviated with rest ongoing for 6 months, dizziness onset 3-4 weeks will check blood pressure and it is normal. Headache onset few months, L side radiates to neck. Is not able to tilt head all the way back, can move head side to side    Medication Refill    Would like to increase mounjaro  if possible    Subjective    Medication Refill   HPI     Fatigue    Additional comments: Patient reports fatigue not alleviated with rest ongoing for 6 months, dizziness onset 3-4 weeks will check blood pressure and it is normal. Headache onset few months, L side radiates to neck. Is not able to tilt head all the way back, can move head side to side         Medication Refill    Additional comments: Would like to increase mounjaro  if possible       Last edited by Cherry Chiquita HERO on 06/04/2024  1:44 PM.       Discussed the use of AI scribe software for clinical note transcription with the patient, who gave verbal consent to proceed.  History of Present Illness   [name] is a 73 year old female who presents with extreme fatigue, dizziness, and leg weakness.  She experiences extreme fatigue and dizziness, described as feeling 'like I'm drunk,' which began suddenly six months ago and occurs in waves. A few weeks ago, the fatigue was so severe that she was unable to get out of a chair due to leg weakness, feeling as if 'the life was being sucked out of me.'  She has persistent pain on the right side of her head extending down her neck, sometimes preventing full head movement. Her sister has commented on her posture, comparing it to her mother who had Alzheimer's.  She experiences frequent urination, approximately 20  times a day, and wakes 5-6 times a night to urinate. She had considered surgery for a bladder pacemaker but was unable to proceed due to cost.  She is concerned about memory issues, noting a family history of Alzheimer's and dementia. She feels her memory is declining, with changes in handwriting and difficulty recalling information.  She is currently taking Rexulti , Wellbutrin  XL, Cymbalta , Zetia , losartan , metformin , and Mounjaro . She also experiences hair loss and takes supplements for hair, nails, and skin. Her sisters have a history of breast cancer, with her baby sister also having uterine cancer.           06/04/2024    1:53 PM 08/08/2023   10:04 AM 07/04/2022    2:01 PM 05/31/2022    1:52 PM  GAD 7 : Generalized Anxiety Score  Nervous, Anxious, on Edge 2 0 1 2  Control/stop worrying 0 0 1 3  Worry too much - different things 0 0 1 1  Trouble relaxing 2 0 1 1  Restless 0 0 1 1  Easily annoyed or irritable 0 0 0 0  Afraid - awful might happen 0 0 1 2  Total GAD 7 Score 4 0 6 10  Anxiety Difficulty  Not difficult at all Somewhat difficult Extremely difficult       06/04/2024  1:52 PM 06/03/2024   12:40 PM 08/08/2023   10:03 AM 07/01/2023    1:58 PM 05/29/2023    9:06 AM  Depression screen PHQ 2/9  Decreased Interest 1 0 0 0 0  Down, Depressed, Hopeless 0 1 0 0 0  PHQ - 2 Score 1 1 0 0 0  Altered sleeping 0 0 0 0   Tired, decreased energy 3 1 0 0   Change in appetite 1 0 0 0   Feeling bad or failure about yourself  0 0 0 0   Trouble concentrating 3 0 0 1   Moving slowly or fidgety/restless 0 0 0 0   Suicidal thoughts 0 0 0 0   PHQ-9 Score 8 2 0 1   Difficult doing work/chores  Not difficult at all Not difficult at all Not difficult at all       06/04/2024    2:14 PM 01/14/2023   10:45 AM  MMSE - Mini Mental State Exam  Orientation to time 5 5  Orientation to Place 5 5  Registration 3 3  Attention/ Calculation 5 4  Recall 2 3  Language- name 2 objects 2 2  Language-  repeat 1 1  Language- follow 3 step command 3 3  Language- read & follow direction 1 1  Write a sentence 1 1  Copy design 0 1  Total score 28 29    Medications: Outpatient Medications Prior to Visit  Medication Sig   acetaminophen  (TYLENOL ) 650 MG CR tablet Take 650 mg by mouth every 8 (eight) hours as needed for pain.   Biotin  w/ Vitamins C & E (HAIR/SKIN/NAILS PO) Take 1 tablet by mouth daily.   Brexpiprazole  (REXULTI ) 0.5 MG TABS Take 1 tablet (0.5 mg total) by mouth daily.   buPROPion  (WELLBUTRIN  XL) 300 MG 24 hr tablet TAKE ONE TABLET BY MOUTH DAILY   DULoxetine  (CYMBALTA ) 60 MG capsule TAKE 1 CAPSULE BY MOUTH DAILY   ezetimibe  (ZETIA ) 10 MG tablet TAKE 1 TABLET BY MOUTH EVERY DAY   losartan  (COZAAR ) 25 MG tablet Take 0.5 tablets (12.5 mg total) by mouth daily.   metFORMIN  (GLUCOPHAGE ) 500 MG tablet TAKE ONE (1) TABLET BY MOUTH TWICE DAILY   pregabalin  (LYRICA ) 75 MG capsule TAKE 1 CAPSULE BY MOUTH 3 TIMES DAILY   tolterodine  (DETROL  LA) 4 MG 24 hr capsule Take 1 capsule (4 mg total) by mouth daily.   [DISCONTINUED] oxybutynin  (DITROPAN -XL) 10 MG 24 hr tablet Take 10 mg by mouth at bedtime.   [DISCONTINUED] tirzepatide  (MOUNJARO ) 5 MG/0.5ML Pen INJECT 5 MG SUBCUTANEOUSLY WEEKLY   [DISCONTINUED] ketoconazole  (NIZORAL ) 2 % shampoo APPLY 1 APPLICATION TOPICALLY 2 (TWO) TIMES A WEEK. (Patient not taking: Reported on 06/04/2024)   [DISCONTINUED] meloxicam  (MOBIC ) 15 MG tablet TAKE 1 TABLET (15 MG TOTAL) BY MOUTH DAILY. (Patient not taking: Reported on 06/04/2024)   [DISCONTINUED] polyethylene glycol (MIRALAX  / GLYCOLAX ) 17 g packet Take 17 g by mouth daily as needed for mild constipation. (Patient not taking: Reported on 06/04/2024)   No facility-administered medications prior to visit.    Review of Systems     Objective    BP 121/79 (BP Location: Left Arm, Patient Position: Sitting, Cuff Size: Normal)   Pulse 97   Ht 5' 7.5 (1.715 m)   Wt 230 lb 1.6 oz (104.4 kg)   LMP  (LMP  Unknown) Comment: age 75  SpO2 97%   BMI 35.51 kg/m    Physical Exam Vitals reviewed.  Constitutional:  General: She is not in acute distress.    Appearance: Normal appearance. She is well-developed. She is not diaphoretic.  HENT:     Head: Normocephalic and atraumatic.  Eyes:     General: No scleral icterus.    Conjunctiva/sclera: Conjunctivae normal.  Neck:     Thyroid : No thyromegaly.  Cardiovascular:     Rate and Rhythm: Normal rate and regular rhythm.     Heart sounds: Normal heart sounds. No murmur heard. Pulmonary:     Effort: Pulmonary effort is normal. No respiratory distress.     Breath sounds: Normal breath sounds. No wheezing, rhonchi or rales.  Musculoskeletal:     Cervical back: Neck supple.     Right lower leg: No edema.     Left lower leg: No edema.  Lymphadenopathy:     Cervical: No cervical adenopathy.  Skin:    General: Skin is warm and dry.     Findings: No rash.  Neurological:     Mental Status: She is alert and oriented to person, place, and time. Mental status is at baseline.     Cranial Nerves: Cranial nerves 2-12 are intact. No dysarthria or facial asymmetry.     Sensory: Sensation is intact.     Motor: Weakness (4/5 in bilateral LE knee flexion, symmetric) present.     Coordination: Coordination is intact.     Gait: Gait abnormal.     Deep Tendon Reflexes:     Reflex Scores:      Patellar reflexes are 1+ on the right side and 1+ on the left side.      Achilles reflexes are 1+ on the right side and 1+ on the left side. Psychiatric:        Mood and Affect: Mood normal.        Behavior: Behavior normal.      No results found for any visits on 06/04/24.  Assessment & Plan     Problem List Items Addressed This Visit       Cardiovascular and Mediastinum   Hypertension associated with diabetes (HCC) - Primary   Relevant Medications   tirzepatide  (MOUNJARO ) 7.5 MG/0.5ML Pen   Other Relevant Orders   Comprehensive metabolic panel with  GFR   Senile purpura (HCC)   Chronic and stable CTM      Chronic systolic CHF (congestive heart failure), NYHA class 3 (HCC)   Chronic and stable - f/b cardiology        Endocrine   Type 2 diabetes mellitus with complication, without long-term current use of insulin  (HCC)   Relevant Medications   tirzepatide  (MOUNJARO ) 7.5 MG/0.5ML Pen   Other Relevant Orders   Hemoglobin A1c   Hyperlipidemia associated with type 2 diabetes mellitus (HCC)   Relevant Medications   tirzepatide  (MOUNJARO ) 7.5 MG/0.5ML Pen   Other Relevant Orders   Comprehensive metabolic panel with GFR   Lipid panel     Other   MDD (major depressive disorder)   Morbid obesity (HCC)   Relevant Medications   tirzepatide  (MOUNJARO ) 7.5 MG/0.5ML Pen   Memory changes   Relevant Orders   Ambulatory referral to Neurology   GAD (generalized anxiety disorder)   Other Visit Diagnoses       Chronic fatigue       Relevant Orders   CBC with Differential/Platelet   Comprehensive metabolic panel with GFR   Vitamin B12   VITAMIN D  25 Hydroxy (Vit-D Deficiency, Fractures)   TSH     Benign paroxysmal positional  vertigo, unspecified laterality         Neck pain         Hair loss               Vertigo with episodic dizziness and gait disturbance Episodic dizziness and gait disturbance with a sensation of walking like being drunk. Symptoms are episodic and associated with extreme fatigue. Neurological examination shows symmetric leg weakness and no focal deficits, suggesting vertigo rather than a stroke. Symptoms are likely due to vertigo, possibly exacerbated by visual input mismatch. - Send Epley maneuver instructions via MyChart - Prescribe meclizine for use as needed during vertigo spells - Send prescription to mail order pharmacy  Chronic neck pain with associated headache Chronic neck pain with associated headache, primarily on the right side, extending from the head down the neck. Pain limits neck movement  and is likely due to muscle tightness. - Recommend using a heating pad for neck muscle tightness - Consider physical therapy if symptoms do not improve with heating pad  Subjective memory loss Subjective memory loss with a family history of dementia and Alzheimer's disease. Scored 28 out of 30 on a memory test, indicating some memory changes. - Refer to Dr. Loreli, neurologist, for further evaluation of memory loss - Ensure neurology office contacts her for appointment scheduling  Type 2 diabetes mellitus Type 2 diabetes mellitus managed with metformin  and Mounjaro . Current dose of Mounjaro  is not as effective as before. - Increase Mounjaro  to 7.5 mg weekly - Order A1c test to assess diabetes control  Hyperlipidemia Hyperlipidemia managed with Zetia  due to intolerance to statins.  Hypertension Hypertension well-controlled on a low dose of losartan .  Depression and anxiety Depression and anxiety managed with Rexulti , Wellbutrin  XL, and Cymbalta .  Overactive bladder with nocturia and frequency Overactive bladder with nocturia and frequency. Previous plan for pacemaker insertion was canceled due to high copay. - Advise follow-up with Dr. 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 - Recommend using men's Rogaine once daily for hair loss        Return in about 3 months (around 09/04/2024) for CPE.      I personally spent a total of 45 minutes in the care of the patient today including preparing to see the patient, getting/reviewing separately obtained history, performing a medically appropriate exam/evaluation, counseling and educating, placing orders, referring and communicating with other health care professionals, documenting clinical information in the EHR, independently interpreting results, and coordinating care.    Jon Eva, MD  [facility] [phone] (phone) [phone] (fax)  [facility]

## 2024-06-05 LAB — COMPREHENSIVE METABOLIC PANEL WITH GFR
ALT: 10 IU/L (ref 0–32)
AST: 14 IU/L (ref 0–40)
Albumin: 3.7 g/dL — ABNORMAL LOW (ref 3.9–4.9)
Alkaline Phosphatase: 120 IU/L (ref 44–121)
BUN/Creatinine Ratio: 16 (ref 12–28)
BUN: 18 mg/dL (ref 8–27)
Bilirubin Total: 0.2 mg/dL (ref 0.0–1.2)
CO2: 24 mmol/L (ref 20–29)
Calcium: 9.4 mg/dL (ref 8.7–10.3)
Chloride: 98 mmol/L (ref 96–106)
Creatinine, Ser: 1.1 mg/dL — ABNORMAL HIGH (ref 0.57–1.00)
Globulin, Total: 3.8 g/dL (ref 1.5–4.5)
Glucose: 94 mg/dL (ref 70–99)
Potassium: 5.1 mmol/L (ref 3.5–5.2)
Sodium: 138 mmol/L (ref 134–144)
Total Protein: 7.5 g/dL (ref 6.0–8.5)
eGFR: 54 mL/min/1.73 — ABNORMAL LOW (ref >59)

## 2024-06-05 LAB — CBC WITH DIFFERENTIAL/PLATELET
Basophils Absolute: 0 x10E3/uL (ref 0.0–0.2)
Basos: 1 %
EOS (ABSOLUTE): 0.2 x10E3/uL (ref 0.0–0.4)
Eos: 4 %
Hematocrit: 35.2 % (ref 34.0–46.6)
Hemoglobin: 10.8 g/dL — ABNORMAL LOW (ref 11.1–15.9)
Immature Grans (Abs): 0.1 x10E3/uL (ref 0.0–0.1)
Immature Granulocytes: 1 %
Lymphocytes Absolute: 1.4 x10E3/uL (ref 0.7–3.1)
Lymphs: 21 %
MCH: 24.5 pg — ABNORMAL LOW (ref 26.6–33.0)
MCHC: 30.7 g/dL — ABNORMAL LOW (ref 31.5–35.7)
MCV: 80 fL (ref 79–97)
Monocytes Absolute: 0.6 x10E3/uL (ref 0.1–0.9)
Monocytes: 9 %
Neutrophils Absolute: 4.1 x10E3/uL (ref 1.4–7.0)
Neutrophils: 64 %
Platelets: 400 x10E3/uL (ref 150–450)
RBC: 4.4 x10E6/uL (ref 3.77–5.28)
RDW: 15.8 % — ABNORMAL HIGH (ref 11.7–15.4)
WBC: 6.3 x10E3/uL (ref 3.4–10.8)

## 2024-06-05 LAB — LIPID PANEL
Chol/HDL Ratio: 6.2 ratio — ABNORMAL HIGH (ref 0.0–4.4)
Cholesterol, Total: 265 mg/dL — ABNORMAL HIGH (ref 100–199)
HDL: 43 mg/dL (ref >39)
LDL Chol Calc (NIH): 174 mg/dL — ABNORMAL HIGH (ref 0–99)
Triglycerides: 252 mg/dL — ABNORMAL HIGH (ref 0–149)
VLDL Cholesterol Cal: 48 mg/dL — ABNORMAL HIGH (ref 5–40)

## 2024-06-05 LAB — VITAMIN B12: Vitamin B-12: 372 pg/mL (ref 232–1245)

## 2024-06-05 LAB — VITAMIN D 25 HYDROXY (VIT D DEFICIENCY, FRACTURES): Vit D, 25-Hydroxy: 18.6 ng/mL — ABNORMAL LOW (ref 30.0–100.0)

## 2024-06-05 LAB — HEMOGLOBIN A1C
Est. average glucose Bld gHb Est-mCnc: 123 mg/dL
Hgb A1c MFr Bld: 5.9 % — ABNORMAL HIGH (ref 4.8–5.6)

## 2024-06-05 LAB — TSH: TSH: 2.46 u[IU]/mL (ref 0.450–4.500)

## 2024-06-05 NOTE — Telephone Encounter (Signed)
 Requested Prescriptions  Pending Prescriptions Disp Refills   DULoxetine  (CYMBALTA ) 60 MG capsule [Pharmacy Med Name: DULOXETINE  DR 60MG  CAP 60 Capsule] 90 capsule 1    Sig: TAKE 1 CAPSULE BY MOUTH EVERY DAY *PATIENT MUST KEEP UPCOMING OFFICE VISIT FOR ADDITIONAL REFILLS*     Psychiatry: Antidepressants - SNRI - duloxetine  Failed - 06/05/2024  6:11 PM      Failed - Cr in normal range and within 360 days    Creatinine  Date Value Ref Range Status  08/31/2014 0.63 0.60 - 1.30 mg/dL Final   Creatinine, Ser  Date Value Ref Range Status  06/04/2024 1.10 (H) 0.57 - 1.00 mg/dL Final         Passed - eGFR is 30 or above and within 360 days    EGFR (African American)  Date Value Ref Range Status  08/31/2014 >60 >69mL/min Final  11/05/2013 >60  Final   GFR calc Af Amer  Date Value Ref Range Status  12/21/2020 72 >59 mL/min/1.73 Final    Comment:    **In accordance with recommendations from the NKF-ASN Task force,**   [facility] is in the process of updating its eGFR calculation to the   2021 CKD-EPI creatinine equation that estimates kidney function   without a race variable.    EGFR (Non-African Amer.)  Date Value Ref Range Status  08/31/2014 >60 >64mL/min Final    Comment:    eGFR values <26mL/min/1.73 m2 may be an indication of chronic kidney disease (CKD). Calculated eGFR, using the MRDR Study equation, is useful in  patients with stable renal function. The eGFR calculation will not be reliable in acutely ill patients when serum creatinine is changing rapidly. It is not useful in patients on dialysis. The eGFR calculation may not be applicable to patients at the low and high extremes of body sizes, pregnant women, and vegetarians.   11/05/2013 >60  Final    Comment:    eGFR values <47mL/min/1.73 m2 may be an indication of chronic kidney disease (CKD). Calculated eGFR is useful in patients with stable renal function. The eGFR calculation will not be reliable in acutely ill  patients when serum creatinine is changing rapidly. It is not useful in  patients on dialysis. The eGFR calculation may not be applicable to patients at the low and high extremes of body sizes, pregnant women, and vegetarians.    GFR, Estimated  Date Value Ref Range Status  04/05/2022 >60 >60 mL/min Final    Comment:    (NOTE) Calculated using the CKD-EPI Creatinine Equation (2021)    GFR  Date Value Ref Range Status  12/13/2016 113.78 >60.00 mL/min Final   eGFR  Date Value Ref Range Status  06/04/2024 54 (L) >59 mL/min/1.73 Final         Passed - Completed PHQ-2 or PHQ-9 in the last 360 days      Passed - Last BP in normal range    BP Readings from Last 1 Encounters:  06/04/24 121/79         Passed - Valid encounter within last 6 months    Recent Outpatient Visits           Yesterday Hypertension associated with diabetes [facility])    [facility] [place], Jon HERO, MD       Future Appointments             In 3 months Bacigalupo, Jon HERO, MD [facility], [facility]  metFORMIN  (GLUCOPHAGE ) 500 MG tablet [Pharmacy Med Name: METFORMIN  500 MG TABS 500 Tablet] 180 tablet 1    Sig: TAKE 1 TABLET BY MOUTH TWICE DAILY *PATIENT MUST KEEP UPCOMING OFFICE VISIT FOR ADDITIONAL REFILLS*     Endocrinology:  Diabetes - Biguanides Failed - 06/05/2024  6:11 PM      Failed - Cr in normal range and within 360 days    Creatinine  Date Value Ref Range Status  08/31/2014 0.63 0.60 - 1.30 mg/dL Final   Creatinine, Ser  Date Value Ref Range Status  06/04/2024 1.10 (H) 0.57 - 1.00 mg/dL Final         Failed - eGFR in normal range and within 360 days    EGFR (African American)  Date Value Ref Range Status  08/31/2014 >60 >12mL/min Final  11/05/2013 >60  Final   GFR calc Af Amer  Date Value Ref Range Status  12/21/2020 72 >59 mL/min/1.73 Final    Comment:    **In accordance with recommendations from the NKF-ASN Task  force,**   [facility] is in the process of updating its eGFR calculation to the   2021 CKD-EPI creatinine equation that estimates kidney function   without a race variable.    EGFR (Non-African Amer.)  Date Value Ref Range Status  08/31/2014 >60 >42mL/min Final    Comment:    eGFR values <60mL/min/1.73 m2 may be an indication of chronic kidney disease (CKD). Calculated eGFR, using the MRDR Study equation, is useful in  patients with stable renal function. The eGFR calculation will not be reliable in acutely ill patients when serum creatinine is changing rapidly. It is not useful in patients on dialysis. The eGFR calculation may not be applicable to patients at the low and high extremes of body sizes, pregnant women, and vegetarians.   11/05/2013 >60  Final    Comment:    eGFR values <20mL/min/1.73 m2 may be an indication of chronic kidney disease (CKD). Calculated eGFR is useful in patients with stable renal function. The eGFR calculation will not be reliable in acutely ill patients when serum creatinine is changing rapidly. It is not useful in  patients on dialysis. The eGFR calculation may not be applicable to patients at the low and high extremes of body sizes, pregnant women, and vegetarians.    GFR, Estimated  Date Value Ref Range Status  04/05/2022 >60 >60 mL/min Final    Comment:    (NOTE) Calculated using the CKD-EPI Creatinine Equation (2021)    GFR  Date Value Ref Range Status  12/13/2016 113.78 >60.00 mL/min Final   eGFR  Date Value Ref Range Status  06/04/2024 54 (L) >59 mL/min/1.73 Final         Failed - CBC within normal limits and completed in the last 12 months    WBC  Date Value Ref Range Status  06/04/2024 6.3 3.4 - 10.8 x10E3/uL Final  04/05/2022 5.6 4.0 - 10.5 K/uL Final   RBC  Date Value Ref Range Status  06/04/2024 4.40 3.77 - 5.28 x10E6/uL Final  04/05/2022 4.04 3.87 - 5.11 MIL/uL Final   Hemoglobin  Date Value Ref Range Status   06/04/2024 10.8 (L) 11.1 - 15.9 g/dL Final   Hematocrit  Date Value Ref Range Status  06/04/2024 35.2 34.0 - 46.6 % Final   MCHC  Date Value Ref Range Status  06/04/2024 30.7 (L) 31.5 - 35.7 g/dL Final  94/95/7976 71.0 (L) 30.0 - 36.0 g/dL Final   [facility]  Date Value Ref Range  Status  06/04/2024 24.5 (L) 26.6 - 33.0 pg Final  04/05/2022 23.8 (L) 26.0 - 34.0 pg Final   MCV  Date Value Ref Range Status  06/04/2024 80 79 - 97 fL Final  08/31/2014 86 80 - 100 fL Final   No results found for: PLTCOUNTKUC, LABPLAT, POCPLA RDW  Date Value Ref Range Status  06/04/2024 15.8 (H) 11.7 - 15.4 % Final  08/31/2014 16.3 (H) 11.5 - 14.5 % Final         Passed - HBA1C is between 0 and 7.9 and within 180 days    Hgb A1c MFr Bld  Date Value Ref Range Status  06/04/2024 5.9 (H) 4.8 - 5.6 % Final    Comment:             Prediabetes: 5.7 - 6.4          Diabetes: >6.4          Glycemic control for adults with diabetes: <7.0          Passed - B12 Level in normal range and within 720 days    Vitamin B-12  Date Value Ref Range Status  06/04/2024 372 232 - 1,245 pg/mL Final         Passed - Valid encounter within last 6 months    Recent Outpatient Visits           Yesterday Hypertension associated with diabetes [facility])   [place] [facility] Bacigalupo, Jon HERO, MD       Future Appointments             In 3 months Bacigalupo, Jon HERO, MD [facility]

## 2024-06-08 ENCOUNTER — Telehealth: Payer: Self-pay

## 2024-06-08 ENCOUNTER — Other Ambulatory Visit: Payer: Self-pay | Admitting: Family Medicine

## 2024-06-08 DIAGNOSIS — E1169 Type 2 diabetes mellitus with other specified complication: Secondary | ICD-10-CM

## 2024-06-08 NOTE — Telephone Encounter (Signed)
 Copied from CRM [id number]. Topic: Clinical - Lab/Test Results >> Jun 08, 2024  9:14 AM Carlatta H wrote: Reason for CRM: Please call patient with lab results

## 2024-06-09 NOTE — Telephone Encounter (Signed)
 Cholesterol is out of control at 265. A1c is good at 5.9. Dr. B needs to review when she gets back to address cholesterol and follow up plan.

## 2024-06-10 ENCOUNTER — Telehealth: Payer: Self-pay

## 2024-06-10 NOTE — Telephone Encounter (Signed)
 [name] [name]

## 2024-06-10 NOTE — Telephone Encounter (Signed)
 Copied from CRM [id number]. Topic: Clinical - Lab/Test Results >> Jun 10, 2024 12:01 PM Carlatta H wrote: Reason for CRM: Please call patient back with labs results//She did not receive all of them  LVM for patient regarding this.

## 2024-06-11 ENCOUNTER — Other Ambulatory Visit: Payer: Self-pay | Admitting: Family Medicine

## 2024-06-11 DIAGNOSIS — E1159 Type 2 diabetes mellitus with other circulatory complications: Secondary | ICD-10-CM

## 2024-06-11 NOTE — Telephone Encounter (Signed)
 Requested medication (s) are due for refill today: yes  Requested medication (s) are on the active medication list: yes  Last refill:  06/04/24  Future visit scheduled: yes  Notes to clinic:  Unable to refill per protocol, cannot delegate.      Requested Prescriptions  Pending Prescriptions Disp Refills   meclizine  (ANTIVERT ) 12.5 MG tablet [Pharmacy Med Name: MECLIZINE  12.5MG  TAB*RX 12.5 Tablet] 30 tablet 11    Sig: TAKE 1 TABLET BY MOUTH 3 TIMES DAILY AS NEEDED FOR DIZZINESS     Not Delegated - Gastroenterology: Antiemetics Failed - 06/11/2024  9:00 AM      Failed - This refill cannot be delegated      Passed - Valid encounter within last 6 months    Recent Outpatient Visits           1 week ago Hypertension associated with diabetes [facility])   [place] [facility] Bacigalupo, Jon HERO, MD       Future Appointments             In 2 months Bacigalupo, Jon HERO, MD [facility]

## 2024-06-11 NOTE — Telephone Encounter (Signed)
 Copied from CRM [id number]. Topic: Clinical - Lab/Test Results >> Jun 11, 2024 11:23 AM Donna BRAVO wrote: Reason for CRM: patient returning call, unsure who the caller was. Patient asking about labs and medication refill.  Patient is asking about lab results, what is taking so long for the rest of the labs to be done.    Read verbatim note: A refill request has now been sent over to your provider. Please allow up 72 hours to have your medication refilled and be sure to contact your pharmacy to ensure no refills are on file. If this has not been completed within that time frame please let us  know and we will be glad to check on this for you.   Thank you [facility] Staff   Patient is asking about lab results, what is taking so long for the rest of the labs to be done.

## 2024-06-16 NOTE — Telephone Encounter (Signed)
 Make sure she is taking Zetia  regularly.  If not, resume.  If she is, our other option is to add Repatha  injections 140 mg q2wk. Ok to order 1 month supply with 3 refills if she agrees. Will likely need PA

## 2024-06-16 NOTE — Telephone Encounter (Signed)
Left voicemail requesting patient call back.

## 2024-06-18 MED ORDER — REPATHA SURECLICK 140 MG/ML ~~LOC~~ SOAJ: 140.0000 mg | SUBCUTANEOUS | 3 refills | Status: DC

## 2024-06-18 NOTE — Addendum Note (Signed)
 Addended by: Hailei Besser E on: 06/18/2024 11:21 AM   Modules accepted: Orders

## 2024-06-18 NOTE — Telephone Encounter (Signed)
 Patient given providers message.Per patient she is taking Zetia  regularly and is agreeable to repatha  treatment and aware that it may need a PA.

## 2024-06-29 ENCOUNTER — Telehealth: Payer: Self-pay

## 2024-06-29 NOTE — Telephone Encounter (Signed)
 Copied from CRM [id number]. Topic: General - Other >> Jun 29, 2024  3:10 PM Sophia H wrote: Reason for CRM: Spoke with Charleen who is calling in to check on a fax from [facility] regarding the patient, advised don't see anything in chart. States was sent on 7/11, verified fax # [phone] States will refax, please be on look out. >> Jun 29, 2024  3:25 PM Niels B wrote: Form printed and put in Dr. Rona box for completion

## 2024-06-30 ENCOUNTER — Other Ambulatory Visit: Payer: Self-pay

## 2024-06-30 DIAGNOSIS — N3946 Mixed incontinence: Secondary | ICD-10-CM

## 2024-06-30 MED ORDER — TOLTERODINE TARTRATE ER 4 MG PO CP24: 4.0000 mg | ORAL_CAPSULE | Freq: Every day | ORAL | 3 refills | Status: DC

## 2024-06-30 NOTE — Telephone Encounter (Signed)
 Called patient and set up F/U for mixed incontinence on 10/6 as first available. Sent prescription for Tolterrodine in to pharmacy to cover until then.

## 2024-07-01 NOTE — Telephone Encounter (Signed)
 I completed all paperwork in my box Monday and Tuesday. I don't remember seeign anything for her specifically. Can you tell if it was done?

## 2024-07-03 ENCOUNTER — Other Ambulatory Visit: Payer: Self-pay | Admitting: Family Medicine

## 2024-07-03 NOTE — Telephone Encounter (Signed)
 Form received from [facility] and placed in Dr. Rona box

## 2024-07-06 NOTE — Telephone Encounter (Signed)
 Form completed and placed at front desk box to fax

## 2024-07-06 NOTE — Telephone Encounter (Signed)
 Pt called following up on rx fro Tolterrodine. She wanted it sent to [facility] on [address] but it was sent to [facility] out of [place]. She is going to wait to see if it comes in to mail order this week and if not she will call back and get it transferred to [facility].

## 2024-07-07 ENCOUNTER — Telehealth: Payer: Self-pay | Admitting: Pharmacy Technician

## 2024-07-07 ENCOUNTER — Other Ambulatory Visit (HOSPITAL_COMMUNITY): Payer: Self-pay

## 2024-07-07 DIAGNOSIS — I2510 Atherosclerotic heart disease of native coronary artery without angina pectoris: Secondary | ICD-10-CM

## 2024-07-07 NOTE — Telephone Encounter (Signed)
 Pharmacy Patient Advocate Encounter   Received notification from CoverMyMeds that prior authorization for Repatha  Sureclick 140mg /ml auto-injectors is required/requested.   Insurance verification completed.   The patient is insured through [employer] .   Per test claim: PA required; PA submitted to above mentioned insurance via LATENT Key/confirmation #/EOC [id number] Status is pending

## 2024-07-08 ENCOUNTER — Other Ambulatory Visit (HOSPITAL_COMMUNITY): Payer: Self-pay

## 2024-07-08 NOTE — Telephone Encounter (Signed)
 Pharmacy Patient Advocate Encounter  Received notification from [facility] that Prior Authorization for Repatha  Sureclick 140mg /ml auto-injectors  has been APPROVED from 07/07/2024 to 01/07/2025. Ran test claim, Copay is $0.00. This test claim was processed through [facility]- copay amounts may vary at other pharmacies due to pharmacy/plan contracts, or as the patient moves through the different stages of their insurance plan.   PA #/Case ID/Reference #: [id number]

## 2024-07-09 ENCOUNTER — Other Ambulatory Visit: Payer: Self-pay | Admitting: Family Medicine

## 2024-07-09 NOTE — Telephone Encounter (Signed)
 Copied from CRM #[id number]. Topic: Clinical - Medication Refill >> Jul 09, 2024  5:15 PM Tiffini S wrote: Medication: meclizine  (ANTIVERT ) 12.5 MG tablet   Has the patient contacted their pharmacy? No (Agent: If no, request that the patient contact the pharmacy for the refill. If patient does not wish to contact the pharmacy document the reason why and proceed with request.) (Agent: If yes, when and what did the pharmacy advise?)  This is the patient's preferred pharmacy:    [id number] [place] [place] - [address] [address] Phone: [phone] Fax: [phone]  Is this the correct pharmacy for this prescription? Yes If no, delete pharmacy and type the correct one.   Has the prescription been filled recently? Yes  Is the patient out of the medication? Yes, patient have one tablet left   Has the patient been seen for an appointment in the last year OR does the patient have an upcoming appointment? Yes  Can we respond through MyChart? No, please call the patient at [phone]  Agent: Please be advised that Rx refills may take up to 3 business days. We ask that you follow-up with your pharmacy.

## 2024-07-13 ENCOUNTER — Telehealth: Payer: Self-pay

## 2024-07-13 NOTE — Telephone Encounter (Signed)
 Copied from CRM [id number]. Topic: Clinical - Prescription Issue >> Jul 13, 2024 10:52 AM Charlet HERO wrote: Reason for CRM: Patient is calling about Brexpiprazole  (REXULTI ) 0.5 MG TABS she is stating that the needle is powder coated with a form of latex, she is allergic to latex.

## 2024-07-13 NOTE — Telephone Encounter (Signed)
 Can we call and try to undersand this better? Rexulti  is a pill, so no needle. Maybe she means Repatha ?  The needle still shouldn't have latex on it. She can confirm with her pharmacy though.

## 2024-07-13 NOTE — Telephone Encounter (Signed)
 Pt called back today and said that her order came in on Saturday and the Tolterrodine was not in there, so she needs to [facility] [address]

## 2024-07-13 NOTE — Telephone Encounter (Signed)
 Ok will watch for [employer].  She is talking about Repatha , not Rexulti 

## 2024-07-13 NOTE — Telephone Encounter (Signed)
 Requested medication (s) are due for refill today - no  Requested medication (s) are on the active medication list -yes  Future visit scheduled -yes  Last refill: 07/06/24  #[id number]  Notes to clinic: non delegated Rx, duplicate request- already addressed  Requested Prescriptions  Pending Prescriptions Disp Refills   meclizine  (ANTIVERT ) 12.5 MG tablet 30 tablet 11     Not Delegated - Gastroenterology: Antiemetics Failed - 07/13/2024  3:41 PM      Failed - This refill cannot be delegated      Passed - Valid encounter within last 6 months    Recent Outpatient Visits           1 month ago Hypertension associated with diabetes [facility])   [place] [facility] Bacigalupo, Jon HERO, MD       Future Appointments             In 1 month MacDiarmid, Gl[place]ia, MD [facility] [place]   In 1 month Bacigalupo, Jon HERO, MD [facility]               Requested Prescriptions  Pending Prescriptions Disp Refills   meclizine  (ANTIVERT ) 12.5 MG tablet 30 tablet 11     Not Delegated - Gastroenterology: Antiemetics Failed - 07/13/2024  3:41 PM      Failed - This refill cannot be delegated      Passed - Valid encounter within last 6 months    Recent Outpatient Visits           1 month ago Hypertension associated with diabetes [facility])   [place] [facility] Bacigalupo, Jon HERO, MD       Future Appointments             In 1 month MacDiarmid, Gl[place]ia, MD [facility] [place]   In 1 month Bacigalupo, Jon HERO, MD [facility]

## 2024-07-15 ENCOUNTER — Other Ambulatory Visit: Payer: Self-pay

## 2024-07-15 DIAGNOSIS — N3946 Mixed incontinence: Secondary | ICD-10-CM

## 2024-07-15 MED ORDER — TOLTERODINE TARTRATE ER 4 MG PO CP24: 4.0000 mg | ORAL_CAPSULE | Freq: Every day | ORAL | 3 refills | Status: DC

## 2024-07-16 NOTE — Telephone Encounter (Signed)
 Patient called regarding this medication, states she read the brochure when she picked up the med and saw that the needle is made form a latex derivative, patient is allergic to latex. Wants to know if she can get  adiferent med or different needle?   Please review for Dr. B in her absence

## 2024-07-17 ENCOUNTER — Other Ambulatory Visit: Payer: Self-pay | Admitting: Family Medicine

## 2024-07-17 ENCOUNTER — Other Ambulatory Visit: Payer: Self-pay

## 2024-07-17 DIAGNOSIS — E1169 Type 2 diabetes mellitus with other specified complication: Secondary | ICD-10-CM

## 2024-07-17 DIAGNOSIS — T466X5A Adverse effect of antihyperlipidemic and antiarteriosclerotic drugs, initial encounter: Secondary | ICD-10-CM

## 2024-07-17 MED ORDER — PRALUENT 150 MG/ML ~~LOC~~ SOAJ: 150.0000 mg | SUBCUTANEOUS | 2 refills | Status: DC

## 2024-07-17 NOTE — Telephone Encounter (Addendum)
 Can change to Praluent  150mg  once every 14 days. There is no Latex in that medication or needs. Can send to pharmacy of her choice if she wants.

## 2024-07-17 NOTE — Telephone Encounter (Signed)
 Patient advised and medication sent to pharmacy

## 2024-07-21 ENCOUNTER — Telehealth: Payer: Self-pay

## 2024-07-21 NOTE — Telephone Encounter (Signed)
 Please see the pharmacy note

## 2024-07-21 NOTE — Telephone Encounter (Signed)
 Formulary exception submitted for Praulent given hx of statin intolerance and latex allergy.  Case ID: [id number]  Turn around time of 72 hours. Will follow for result.  Keairra Bardon E. Marsh, PharmD Clinical Pharmacist [facility] [phone]

## 2024-07-21 NOTE — Telephone Encounter (Signed)
 Brief Telephone Documentation Reason for Call: Appointment scheduling  Summary of Call: Called patient to schedule appointment with clinical pharmacist for HLD management.   Formulary exception submitted for Praulent given latex allergy and hx of statin intolerance.  Will try again at a later date.   Lacy Sofia E. Marsh, PharmD Clinical Pharmacist [facility] [phone]

## 2024-07-30 ENCOUNTER — Other Ambulatory Visit

## 2024-07-30 NOTE — Progress Notes (Deleted)
 07/30/2024 Name: [name] [name] MRN: [id number] DOB: Nov 23, 1951  Subjective  No chief complaint on file.   Reason for visit: ?  [name] [name] is a 73 y.o. female who presents today for an initial hyperlipidemia pharmacotherapy visit.? Pertinent PMH includes T2DM, HTN, CHF, CAD, migraines, and PUD.  Care Team: Primary Care Provider: Myrla Jon HERO, MD   Date of Last Visit: with PCP on 06/04/24   Patient was recently prescribed Repatha  for HLD, however, patient has a latex allergy and unable to use this medication. Formulary exception submitted and approved by insurance for use of Praulent.   Medication Access/Adherence: Prescription drug coverage: Payor: [employer] MEDICARE / Plan: DREMA DUAL COMPLETE / Product Type: *No Product type* / .  Reports that all medications are *** affordable.  Medication Adherence: Patient denies*** missing doses of their medication.    Reported Lipid Regimen: ?  Ezetimibe  10 mg daily   Lipid-lowering medications tried in the past:?  Rosuvastatin  5 mg daily Rosuvastatin  10 mg three times weekly Simvastatin  20 mg daily   Reported Diet: Patient typically eats *** meals per day.  Breakfast: *** Lunch: *** Dinner: *** Snacks: *** Beverages: ***  Exercise: ***   Cardiovascular Risk Reduction History of clinical ASCVD? yes The ASCVD Risk score (Arnett DK, et al., 2019) failed to calculate for the following reasons:   Risk score cannot be calculated because patient has a medical history suggesting prior/existing ASCVD History of heart failure? yes ACE/ARB/ARNI: losartan  25 mg daily History of diabetes: yes Current BMI: 35.49 kg/m2 (Ht 5' 7.5 in, Wt 104.4 kg) Taking statin? intolerant Taking GLP- 1 RA? yes     _______________________________________________  Objective      Physical Examination:  Vitals:  Wt Readings from Last 3 Encounters:  06/04/24 230 lb 1.6 oz (104.4 kg)  08/21/23 245 lb 6.4 oz (111.3 kg)   08/08/23 246 lb 6.4 oz (111.8 kg)   BP Readings from Last 3 Encounters:  06/04/24 121/79  08/21/23 138/80  08/08/23 120/70   Pulse Readings from Last 3 Encounters:  06/04/24 97  08/21/23 94  08/08/23 90     Labs:?   Lab Results  Component Value Date   CKTOTAL 62 11/04/2013    Lab Results  Component Value Date   CHOL 265 (H) 06/04/2024   LDLCALC 174 (H) 06/04/2024   LDLCALC 164 (H) 07/01/2023   LDLCALC 223 (H) 02/14/2023   LDLDIRECT 183.0 12/13/2016   HDL 43 06/04/2024   TRIG 252 (H) 06/04/2024   TRIG 197 (H) 07/01/2023   TRIG 247 (H) 02/14/2023   ALT 10 06/04/2024   ALT 8 07/01/2023   AST 14 06/04/2024   AST 17 07/01/2023   Lab Results  Component Value Date   AST 14 06/04/2024   AST 17 07/01/2023   ALT 10 06/04/2024   ALT 8 07/01/2023   BILITOT 0.2 06/04/2024   BILITOT 0.2 07/01/2023   ALBUMIN  3.7 (L) 06/04/2024   ALBUMIN  4.0 07/01/2023   INR 1.1 03/13/2022   INR 1.2 02/14/2022   ALKPHOS 120 06/04/2024   ALKPHOS 128 (H) 07/01/2023   Lab Results  Component Value Date   HGBA1C 5.9 (H) 06/04/2024   HGBA1C 5.8 (H) 07/01/2023   HGBA1C 7.4 (A) 02/14/2023   GLUCOSE 94 06/04/2024   MICRALBCREAT 40 (H) 07/01/2023   MICRALBCREAT 21 04/19/2022   CREATININE 1.10 (H) 06/04/2024   CREATININE 0.83 07/01/2023   CREATININE 0.91 02/14/2023   GFR 113.78 12/13/2016     Chemistry  Component Value Date/Time   NA 138 06/04/2024 1433   NA 138 08/31/2014 0533   K 5.1 06/04/2024 1433   K 4.0 08/31/2014 0533   CL 98 06/04/2024 1433   CL 104 08/31/2014 0533   CO2 24 06/04/2024 1433   CO2 28 08/31/2014 0533   BUN 18 06/04/2024 1433   BUN 12 08/31/2014 0533   CREATININE 1.10 (H) 06/04/2024 1433   CREATININE 0.63 08/31/2014 0533      Component Value Date/Time   CALCIUM  9.4 06/04/2024 1433   CALCIUM  8.2 (L) 08/31/2014 0533   ALKPHOS 120 06/04/2024 1433   AST 14 06/04/2024 1433   ALT 10 06/04/2024 1433   BILITOT 0.2 06/04/2024 1433      The ASCVD Risk  score (Arnett DK, et al., 2019) failed to calculate for the following reasons:   Risk score cannot be calculated because patient has a medical history suggesting prior/existing ASCVD  Assessment and Plan:     Hyperlipidemia (secondary prevention): Uncontrolled based on last lipid panel (06/04/24): TC 265 mg/dL, LDL 825 mg/dL, TG 747 mg/dL. LDL goal <55 mg/dL (secondary prevention).  - Current Regimen: ezetimibe  10 mg daily - Previous therapies: rosuvastatin  (daily and three times weekly), simvastatin  - The ASCVD Risk score (Arnett DK, et al., 2019) failed to calculate for the following reasons:   Risk score cannot be calculated because patient has a medical history suggesting prior/existing ASCVD  Start Praulent 150 mg every two weeks Consider repeat lipid panel 4-12 weeks.        Future Consideration: Bempedoic acid (Nexletol):  CLEAR-Harmony trial  Follow Up Follow up with clinical pharmacist via *** in *** weeks  Future Appointments  Date Time Provider Department Center  07/30/2024 10:30 AM [facility]-PHARMACIST [facility] [place]  08/05/2024 10:00 AM [facility] MM GV[facility]1 [facility]  08/05/2024 10:20 AM [facility] MM GV-DEXA [facility]  09/07/2024  8:30 AM Gaston Hamilton, MD [facility] None  09/07/2024 10:20 AM Myrla Jon HERO, MD [facility] [place]  06/09/2025  1:50 PM [facility]-ANNUAL WELLNESS VISIT [facility] [place] Peyton CHARLENA Issac, PharmD Clinical Pharmacist [facility] [phone]

## 2024-07-31 ENCOUNTER — Telehealth: Payer: Self-pay

## 2024-07-31 NOTE — Progress Notes (Signed)
 Care Guide Pharmacy Note  07/31/2024 Name: [name] [name] MRN: [id number] DOB: Oct 04, 1951  Referred By: Myrla Jon HERO, MD Reason for referral: Complex Care Management and Call Attempt #1 (Unsuccessful initial outreach to schedule with PHARM D- Allyson)   [name] [name] is a 73 y.o. year old female who is a primary care patient of Bacigalupo, Jon HERO, MD.  [name] was referred to the pharmacist for assistance related to: disease management   An unsuccessful telephone outreach was attempted today to contact the patient who was referred to the pharmacy team for assistance with Disease management . Additional attempts will be made to contact the patient.  [name] [facility], [facility] Guide  Direct Dial: [phone]  Fax [phone]

## 2024-08-04 ENCOUNTER — Other Ambulatory Visit: Payer: Self-pay | Admitting: Family Medicine

## 2024-08-04 NOTE — Progress Notes (Unsigned)
 Care Guide Pharmacy Note  08/04/2024 Name: MCKYNZI CAMMON MRN: [id number] DOB: February 12, 1951  Referred By: Myrla Jon HERO, MD Reason for referral: Complex Care Management, Call Attempt #1 (Unsuccessful initial outreach to schedule with PHARM D- Allyson), and Call Attempt #2 (Unsuccessful Initial outreach to schedule with PHARM D- Allyson)   QUINTASHA GREN is a 73 y.o. year old female who is a primary care patient of Bacigalupo, Jon HERO, MD.  [name] was referred to the pharmacist for assistance related to: disease management  A second unsuccessful telephone outreach was attempted today to contact the patient who was referred to the pharmacy team for assistance with disease management. Additional attempts will be made to contact the patient.  [name] [facility], [facility] Guide  Direct Dial: [phone]  Fax [phone]

## 2024-08-05 ENCOUNTER — Other Ambulatory Visit

## 2024-08-05 ENCOUNTER — Encounter

## 2024-08-06 NOTE — Progress Notes (Signed)
 Care Guide Pharmacy Note  08/06/2024 Name: [name] [name] MRN: [id number] DOB: Jan 29, 1951  Referred By: Myrla Jon HERO, MD Reason for referral: Complex Care Management, Call Attempt #1 (Unsuccessful initial outreach to schedule with PHARM DGLENWOOD [place]), Call Attempt #2 (Unsuccessful Initial outreach to schedule with PHARM D- Allyson), and Call Attempt #3 (Unsuccessful initial outreach to schedule with PHARM D- Allyson)   [name] is a 73 y.o. year old female who is a primary care patient of Bacigalupo, Jon HERO, MD.  [name] was referred to the pharmacist for assistance related to: DMII  A third unsuccessful telephone outreach was attempted today to contact the patient who was referred to the pharmacy team for assistance with Disease management. The Population Health team is pleased to engage with this patient at any time in the future upon receipt of referral and should he/she be interested in assistance from the [facility] team.  [name] [facility]  [facility], [facility] Guide  Direct Dial: [phone]  Fax [phone]

## 2024-08-07 ENCOUNTER — Telehealth: Payer: Self-pay

## 2024-08-07 NOTE — Telephone Encounter (Signed)
 S:     Chief Complaint  Patient presents with   Medication Management    Diabetes and Hyperlipidemia    Reason for visit: ?  [name] [name] is a 73 y.o. female with a history of diabetes (type 2), who presents today for an initial diabetes and HLD pharmacotherapy visit.? Pertinent PMH also includesT2DM, HTN, CHF, CAD, migraines, and PUD.   Care Team: Primary Care Provider: Myrla Jon HERO, MD  Per pharmacy, Praulent is available for a $0 copay. Patient reports recent issues with Mounjaro , namely dizziness and feeling generally unwell.   Current diabetes medications include: metformin  500 mg BID Previous diabetes medications include: Mounjaro  (general unwellness), Trulicity  (switched to Mounjaro ), glipizide , Jardiance  (recurrent GU infections) Current hypertension medications include: losartan  25 mg daily Current hyperlipidemia medications include: ezetimibe  (not taking - not in her pill package)  Patient reports adherence to taking all medications as prescribed. All medications are received via pill packaging. When reviewing medication list today, there were 2 medications she did not have in her pill package: ezetimibe  and Rexulti .   Do you have any problems obtaining medications due to transportation or finances? no Insurance coverage: [employer] Medicare  Reported home fasting blood sugars: 90-110 mg/dL  Patient denies hypoglycemia.   Patient denies nocturia (nighttime urination).  Patient denies neuropathy (nerve pain).  Patient reported dietary habits: Eats 1-2 meals/day Dinner: vegetables and chicken  Drinks: cranberry juice, water, zero sugar sodas   Hyperlipidemia:  Lipid-lowering medications tried in the past:?  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, last UACR on 07/01/23 = 40 mg/g Last eye exam: 01/2024 Lab Results  Component Value Date    HMDIABEYEEXA Retinopathy (A) 03/20/2023   Tobacco Use:  Tobacco Use: Medium Risk (06/04/2024)   Patient History    Smoking Tobacco Use: Former    Smokeless Tobacco Use: Never    Passive Exposure: Past   O:  Vitals:  Wt Readings from Last 3 Encounters:  06/04/24 230 lb 1.6 oz (104.4 kg)  08/21/23 245 lb 6.4 oz (111.3 kg)  08/08/23 246 lb 6.4 oz (111.8 kg)   BP Readings from Last 3 Encounters:  06/04/24 121/79  08/21/23 138/80  08/08/23 120/70   Pulse Readings from Last 3 Encounters:  06/04/24 97  08/21/23 94  08/08/23 90     Labs:?  Lab Results  Component Value Date   HGBA1C 5.9 (H) 06/04/2024   HGBA1C 5.8 (H) 07/01/2023   HGBA1C 7.4 (A) 02/14/2023   GLUCOSE 94 06/04/2024   MICRALBCREAT 40 (H) 07/01/2023   MICRALBCREAT 21 04/19/2022   CREATININE 1.10 (H) 06/04/2024   CREATININE 0.83 07/01/2023   CREATININE 0.91 02/14/2023   GFR 113.78 12/13/2016    Lab Results  Component Value Date   CHOL 265 (H) 06/04/2024   LDLCALC 174 (H) 06/04/2024   LDLCALC 164 (H) 07/01/2023   LDLCALC 223 (H) 02/14/2023   LDLDIRECT 183.0 12/13/2016   HDL 43 06/04/2024   TRIG 252 (H) 06/04/2024   TRIG 197 (H) 07/01/2023   TRIG 247 (H) 02/14/2023   ALT 10 06/04/2024   ALT 8 07/01/2023   AST 14 06/04/2024   AST 17 07/01/2023      Chemistry      Component Value Date/Time   NA 138 06/04/2024 1433   NA 138 08/31/2014 0533   K 5.1 06/04/2024 1433   K 4.0 08/31/2014 0533  CL 98 06/04/2024 1433   CL 104 08/31/2014 0533   CO2 24 06/04/2024 1433   CO2 28 08/31/2014 0533   BUN 18 06/04/2024 1433   BUN 12 08/31/2014 0533   CREATININE 1.10 (H) 06/04/2024 1433   CREATININE 0.63 08/31/2014 0533      Component Value Date/Time   CALCIUM  9.4 06/04/2024 1433   CALCIUM  8.2 (L) 08/31/2014 0533   ALKPHOS 120 06/04/2024 1433   AST 14 06/04/2024 1433   ALT 10 06/04/2024 1433   BILITOT 0.2 06/04/2024 1433       The ASCVD Risk score (Arnett DK, et al., 2019) failed to calculate for the  following reasons:   Risk score cannot be calculated because patient has a medical history suggesting prior/existing ASCVD  Lab Results  Component Value Date   MICRALBCREAT 40 (H) 07/01/2023   MICRALBCREAT 21 04/19/2022    A/P:  Hyperlipidemia (secondary prevention): Uncontrolled based on last lipid panel (06/04/24): TC 265 mg/dL, LDL 825 mg/dL, TG 747 mg/dL. LDL goal <55 mg/dL (secondary prevention).  - Current Regimen: none - Previous therapies: rosuvastatin  (daily and three times weekly), simvastatin , ezetimibe  -Patient educated on purpose, proper use, and potential adverse effects of Praulent.  - The ASCVD Risk score (Arnett DK, et al., 2019) failed to calculate for the following reasons:   Risk score cannot be calculated because patient has a medical history suggesting prior/existing ASCVD  Start Praulent 150 mg every two weeks Consider repeat lipid panel 4-12 weeks.  Consider re-initiation of ezetimibe  at follow up appointment       Future Consideration: Bempedoic acid (Nexletol):  CLEAR-Harmony trial  Diabetes currently controlled with a most recent A1c of 5.9% on 06/04/24. Patient is able to verbalize appropriate hypoglycemia management plan. Medication adherence appears appropriate. Has been off Mounjaro  therapy for about 2 weeks. Will follow up in a month to determine if additional therapy is warranted. Recent BG levels are controlled on biguanide therapy. -Continued metformin  500 mg BID.  -Extensively discussed pathophysiology of diabetes, recommended lifestyle interventions, dietary effects on blood sugar control.  -Counseled on s/sx of and management of hypoglycemia.  -Next A1c anticipated 09/2024.    Patient verbalized understanding of treatment plan. Total time patient counseling 25 minutes.  Follow-up:  Pharmacist on 09/23/24 PCP clinic visit on 09/07/24  [name], PharmD Clinical Pharmacist [facility] [phone]

## 2024-08-12 NOTE — Telephone Encounter (Signed)
 Thanks for the update. I agree with your plan.

## 2024-08-19 DIAGNOSIS — Z599 Problem related to housing and economic circumstances, unspecified: Secondary | ICD-10-CM | POA: Diagnosis not present

## 2024-08-19 DIAGNOSIS — R531 Weakness: Secondary | ICD-10-CM | POA: Diagnosis not present

## 2024-08-19 DIAGNOSIS — R413 Other amnesia: Secondary | ICD-10-CM | POA: Diagnosis not present

## 2024-08-19 DIAGNOSIS — R42 Dizziness and giddiness: Secondary | ICD-10-CM | POA: Diagnosis not present

## 2024-08-24 ENCOUNTER — Other Ambulatory Visit: Payer: Self-pay | Admitting: Neurology

## 2024-08-24 DIAGNOSIS — R413 Other amnesia: Secondary | ICD-10-CM

## 2024-08-27 DIAGNOSIS — R296 Repeated falls: Secondary | ICD-10-CM | POA: Diagnosis not present

## 2024-08-27 DIAGNOSIS — R278 Other lack of coordination: Secondary | ICD-10-CM | POA: Diagnosis not present

## 2024-08-27 DIAGNOSIS — R262 Difficulty in walking, not elsewhere classified: Secondary | ICD-10-CM | POA: Diagnosis not present

## 2024-08-30 ENCOUNTER — Ambulatory Visit
Admission: RE | Admit: 2024-08-30 | Discharge: 2024-08-30 | Disposition: A | Discharge status: Home / Self Care | Source: Ambulatory Visit | Attending: Neurology | Admitting: Neurology

## 2024-08-30 DIAGNOSIS — R413 Other amnesia: Secondary | ICD-10-CM | POA: Insufficient documentation

## 2024-08-30 DIAGNOSIS — R42 Dizziness and giddiness: Secondary | ICD-10-CM | POA: Diagnosis not present

## 2024-09-02 ENCOUNTER — Other Ambulatory Visit

## 2024-09-02 ENCOUNTER — Encounter

## 2024-09-07 ENCOUNTER — Ambulatory Visit (INDEPENDENT_AMBULATORY_CARE_PROVIDER_SITE_OTHER): Admitting: Family Medicine

## 2024-09-07 ENCOUNTER — Ambulatory Visit (INDEPENDENT_AMBULATORY_CARE_PROVIDER_SITE_OTHER): Admitting: Urology

## 2024-09-07 ENCOUNTER — Encounter: Payer: Self-pay | Admitting: Family Medicine

## 2024-09-07 VITALS — BP 168/82 | HR 94 | Ht 67.0 in | Wt 238.0 lb

## 2024-09-07 VITALS — BP 133/66 | HR 82 | Ht 67.0 in | Wt 230.4 lb

## 2024-09-07 DIAGNOSIS — Z0000 Encounter for general adult medical examination without abnormal findings: Secondary | ICD-10-CM | POA: Diagnosis not present

## 2024-09-07 DIAGNOSIS — D649 Anemia, unspecified: Secondary | ICD-10-CM | POA: Diagnosis not present

## 2024-09-07 DIAGNOSIS — M791 Myalgia, unspecified site: Secondary | ICD-10-CM | POA: Diagnosis not present

## 2024-09-07 DIAGNOSIS — E1169 Type 2 diabetes mellitus with other specified complication: Secondary | ICD-10-CM

## 2024-09-07 DIAGNOSIS — N3946 Mixed incontinence: Secondary | ICD-10-CM

## 2024-09-07 DIAGNOSIS — E1159 Type 2 diabetes mellitus with other circulatory complications: Secondary | ICD-10-CM | POA: Diagnosis not present

## 2024-09-07 DIAGNOSIS — E785 Hyperlipidemia, unspecified: Secondary | ICD-10-CM | POA: Diagnosis not present

## 2024-09-07 DIAGNOSIS — I152 Hypertension secondary to endocrine disorders: Secondary | ICD-10-CM

## 2024-09-07 DIAGNOSIS — Z1211 Encounter for screening for malignant neoplasm of colon: Secondary | ICD-10-CM | POA: Diagnosis not present

## 2024-09-07 DIAGNOSIS — F330 Major depressive disorder, recurrent, mild: Secondary | ICD-10-CM | POA: Diagnosis not present

## 2024-09-07 DIAGNOSIS — E559 Vitamin D deficiency, unspecified: Secondary | ICD-10-CM | POA: Diagnosis not present

## 2024-09-07 DIAGNOSIS — Z7984 Long term (current) use of oral hypoglycemic drugs: Secondary | ICD-10-CM | POA: Diagnosis not present

## 2024-09-07 DIAGNOSIS — F411 Generalized anxiety disorder: Secondary | ICD-10-CM

## 2024-09-07 DIAGNOSIS — D509 Iron deficiency anemia, unspecified: Secondary | ICD-10-CM

## 2024-09-07 LAB — URINALYSIS, COMPLETE
Bilirubin, UA: NEGATIVE
Glucose, UA: NEGATIVE
Ketones, UA: NEGATIVE
Nitrite, UA: NEGATIVE
Specific Gravity, UA: 1.01 (ref 1.005–1.030)
Urobilinogen, Ur: 0.2 mg/dL (ref 0.2–1.0)
pH, UA: 7 (ref 5.0–7.5)

## 2024-09-07 LAB — MICROSCOPIC EXAMINATION: WBC, UA: >30 /HPF — AB (ref 0–5)

## 2024-09-07 MED ORDER — PRALUENT 150 MG/ML ~~LOC~~ SOAJ: 150.0000 mg | SUBCUTANEOUS | 3 refills | Status: AC

## 2024-09-07 MED ORDER — GEMTESA 75 MG PO TABS: 75.0000 mg | ORAL_TABLET | Freq: Every day | ORAL | 3 refills | Status: AC

## 2024-09-07 MED ORDER — REXULTI 0.5 MG PO TABS: 0.5000 mg | ORAL_TABLET | Freq: Every day | ORAL | 1 refills | Status: DC

## 2024-09-07 MED ORDER — TOLTERODINE TARTRATE ER 4 MG PO CP24: 4.0000 mg | ORAL_CAPSULE | Freq: Every day | ORAL | 3 refills | Status: AC

## 2024-09-07 MED ORDER — BUPROPION HCL ER (XL) 300 MG PO TB24: 300.0000 mg | ORAL_TABLET | Freq: Every day | ORAL | 1 refills | Status: AC

## 2024-09-07 MED ORDER — REXULTI 0.5 MG PO TABS: 1.0000 mg | ORAL_TABLET | Freq: Every day | ORAL | 1 refills | Status: DC

## 2024-09-07 MED ORDER — DULOXETINE HCL 60 MG PO CPEP: 60.0000 mg | ORAL_CAPSULE | Freq: Every day | ORAL | 1 refills | Status: AC

## 2024-09-07 NOTE — Progress Notes (Signed)
 09/07/2024 8:46 AM   [name] [name] 1951-06-12 [id number]  Referring provider: Myrla Jon HERO, MD [address] [place],  [place] [address]  Chief Complaint  Patient presents with   Follow-up   Urinary Incontinence    HPI: 2019: Patient was consulted to be assessed for worsening urinary incontinence since a hysterectomy in October 2018.  She leaks with coughing sneezing bending lifting a significant amount.  She wears 10-15 pads a day that are soaked.  She has urge incontinence.  She can run water and leak without awareness. She will leak when she goes from a sitting to standing position.  She has foot on the floor syndrome.  She has mild bedwetting but not every night   She voids every 30 to 45 minutes and cannot hold it for 2 hours.  She gets up 5-6 times a night.  She has left ankle edema.   The pelvic examination was a little bit limited by her obesity.  She had grade 2 hypermobility the bladder neck and a negative cough test but her bladder I believe is quite empty.  She had a large suprapubic fat pad   Patient has high-volume mixed incontinence with typical triggers.  She has intermittent milder bedwetting.  She has severe frequency.  She has significant nocturia.  If the patient ever needed a sling she would need a mini sling and not a retropubic    On urodynamics she did not void but her bladder was empty.  Her maximum bladder capacity was 100 mL.  She had sensory urgency.  The maximum detrusor pressure was 38 cm water occurring at 62 mL.  She had to void off the contraction.  She had no leakage associated with coughing reaching a pressure of 148 cm of water.  She was triggering instability.  The instability was provoked and unprovoked throughout.  During voluntary voiding she voided 76 mils of maximum voiding pressure 21 cm of water.  She emptied efficiently.  Bladder neck descent at 1 cm.   At least 90% of the patient's problem is not overactive bladder.  If she  fails medical and behavioral therapy I would be offering her a refractory overactive bladder therapy.  If she truly does have stress incontinence this could persist.   Based upon body habitus Botox and InterStim would not be ideal   Myrbetriq  helped minimally.  She now only wears about 3 pads a day and only getting up twice at night   She is concerned about percutaneous tibial nerve stimulation because of neuropathy and a skin condition I believe.   Less frequency during the day.  Gets up twice instead of four times.  Three pads per day incontinence stable.    Detrol  works better than the oxybutynin  gave side effect.  Still using 3 pads a day.  Getting up 2-4 times a day.  Overall much better.  I think we have reached the end of the algorithm.  She thought about percutaneous tibial nerve stimulation but is chosen not to.  Reassess in the year on Detrol    Patient still has urge incontinence wearing 3 pads a day.  Detrol  helps some.  Clinically not infected.  Frequency stable.  She started to do to do percutaneous tibial nerve stimulation treatments but was having trouble with neuropathy and some leg ulcers and stopped.  She is lost 160 pounds.  She is going to have plastic surgery.     I sent 90x3 Detrol .  I gave her  the new beta 3 agonists with samples and prescription.  She will stay on combination if it works well.   Patient has had a dramatic improvement with Gemtesa  the new beta 3 agonists greatly reducing her urgency incontinence and improving her quality life.  She is leaking a lot less.  The Detrol  is helping minimally but the 2 together are working well.  Clinically not infected.  She is now lost 170 pounds.  She is going to have plastic surgery on September 7 to remove with her pannus   She is failed Myrbetriq  and double other antimuscarinics.  She is failed percutaneous tibial nerve stimulation.  Due to obesity was not a good candidate for InterStim and Botox   In the last 2 weeks  incontinence worse.  She went from 3 pads a day to 10 pads that can be quite soaked.  Still on the Gemtesa  and Detrol .  Getting up 3-4 times a night now.  No infection symptoms.  Plastic surgery on pannus delayed till next month.  On full examination she had a large suprapubic pannus.  I think physically she could have sacral nerve stimulation based on body habitus.  I palpated her lower area   Patient has high-volume urge incontinence.  Currently I think we have reached the end of the treatment pathway but she may be a good candidate for InterStim or perhaps even Botox post plastic surgery recovery.  She might be able to perform self-catheterization at that point.  Call if urine culture is positive   Urine looked positive.  I gave her ciprofloxacin  250 mg twice a day for 7 days.   Gave 6 weeks of Gemtesa .  She can come in 6 weeks of his work and get more samples.  I will see him in 3 months postsurgery.  We can entertain Botox and InterStim then if needed   Today I last saw the patient in October 2022.  Still on Gemtesa  and Detrol  though her prescriptions ran out.  She has 3-4 pads a day soaking can [place] to urge incontinence.  It can run down her leg.  Clinically not infected.  Still leaks a moderate amount with coughing sneezing.  No bedwetting.   She had the pannus removed and had a number of wound issues with I believe 5 admissions.  This the abdominal area is completely healed now.  On no antibiotics.  Allergic to sulfa and latex and doxycycline.  Wound dehiscence completely healed.   Clinically not infected.  Frequency stable.  I went over Botox and InterStim with full templates.   I examined her lower back and she would be a good candidate for both treatments.  Large pannus has been removed.    Patient has mixed incontinence but primarily high-volume urge incontinence. Both prescriptions renewed 90 x 3. She would like to proceed with a peripheral nerve evaluation. Otherwise we will do  Botox if she fails. Call if culture positive. We will schedule the peripheral nerve evaluation in [place] but do the implant here locally   Today I last saw the patient in February 2024.  She was on Gemtesa  and Detrol .  She did not proceed with a peripheral nerve evaluation because of cost Doing quite well on Detrol  and Gemtesa  with significant improvement.  Clinically not infected.  Cannot afford the $900 for InterStim.  Both prescription and had run out   PMH: Past Medical History:  Diagnosis Date   Anxiety    Arthritis    Rhumetoid arthritis  BRCA negative 07/2017   MyRisk neg   Cancer (HCC)    uterine   Cellulitis of both lower extremities    Chronic   CHF (congestive heart failure) (HCC)    Coronary artery disease    COVID-19 11/2020   Depression    Diabetes mellitus without complication (HCC)    Family history of breast cancer 07/2017   MyRisk neg; IBIS=10.5%/riskscore=17%   Family history of ovarian cancer    Fibromyalgia    Fibromyalgia affecting shoulder region    Headache    Hyperlipidemia    Hypertension    Spinal stenosis    Torn rotator cuff    bilateral   Wears dentures    full upper    Surgical History: Past Surgical History:  Procedure Laterality Date   ABLATION SAPHENOUS VEIN W/ RFA     ANAL FISSURECTOMY  01/2004   CATARACT EXTRACTION W/PHACO Left 06/13/2023   Procedure: CATARACT EXTRACTION PHACO AND INTRAOCULAR LENS PLACEMENT (IOC) LEFT DIABETIC  5.79  00:42.7;  Surgeon: Enola Feliciano Hugger, MD;  Location: [facility];  Service: Ophthalmology;  Laterality: Left;   CATARACT EXTRACTION W/PHACO Right 06/27/2023   Procedure: CATARACT EXTRACTION PHACO AND INTRAOCULAR LENS PLACEMENT (IOC) RIGHT DIABETIC 4.21 [id number];  Surgeon: Enola Feliciano Hugger, MD;  Location: [facility];  Service: Ophthalmology;  Laterality: Right;   COLON SURGERY     CORONARY ANGIOPLASTY     CYSTOSCOPY  09/03/2017   Procedure: CYSTOSCOPY;  Surgeon: Arloa Lamar SQUIBB, MD;  Location: [facility];  Service: Gynecology;;   DILATION AND CURETTAGE OF UTERUS     ESOPHAGEAL DILATION  02/20/2022   Procedure: ESOPHAGEAL DILATION;  Surgeon: Rollin Dover, MD;  Location: [facility];  Service: Gastroenterology;;  savory   ESOPHAGOGASTRODUODENOSCOPY (EGD) WITH PROPOFOL  N/A 02/20/2022   Procedure: ESOPHAGOGASTRODUODENOSCOPY (EGD) WITH PROPOFOL ;  Surgeon: Rollin Dover, MD;  Location: [facility];  Service: Gastroenterology;  Laterality: N/A;  Dyspahgia   EYE SURGERY Bilateral 08/2020   eye lift   HERNIA REPAIR     INCISION AND DRAINAGE OF WOUND  03/04/2022   Procedure: IRRIGATION AND DEBRIDEMENT ABDOMEN WITH VAC PLACEMENT, MYRIAD PLACEMENT;  Surgeon: Lowery Estefana RAMAN, DO;  Location: [facility];  Service: Plastics;;   LAPAROSCOPIC HYSTERECTOMY N/A 09/03/2017   Procedure: HYSTERECTOMY TOTAL LAPAROSCOPIC BSO;  Surgeon: Arloa Lamar SQUIBB, MD;  Location: [facility];  Service: Gynecology;  Laterality: N/A;   MEDIAL PARTIAL KNEE REPLACEMENT Left 01/06/2014   PANNICULECTOMY N/A 02/05/2022   Procedure: PANNICULECTOMY;  Surgeon: Lowery Estefana RAMAN, DO;  Location: [facility] OR;  Service: Plastics;  Laterality: N/A;  3 hours   total arthroplasty of left shoulder Left 05/04/2023   Dr. Leora   UMBILICAL HERNIA REPAIR N/A 09/05/2015   Procedure: HERNIA REPAIR incarcerated UMBILICAL ADULT;  Surgeon: Oneil Chang, MD;  Location: [facility];  Service: General;  Laterality: N/A;   Vascular Stent  11/04/2013    Home Medications:  Allergies as of 09/07/2024       Reactions   Sulfa Antibiotics Itching, Rash, Swelling   Latex Itching   Other reaction(s): angioedema (Gloves when touched by others)   Doxycycline Rash   Empagliflozin  Itching, Other (See Comments)   Itching and frequent yeast infections        Medication List        Accurate as of September 07, 2024  8:46 AM. If you have any questions, ask your nurse or doctor.          acetaminophen  650 MG CR tablet  Commonly  known as: TYLENOL  Take 650 mg by mouth every 8 (eight) hours as needed for pain.   buPROPion  300 MG 24 hr tablet Commonly known as: WELLBUTRIN  XL TAKE ONE TABLET BY MOUTH DAILY   DULoxetine  60 MG capsule Commonly known as: CYMBALTA  TAKE 1 CAPSULE BY MOUTH EVERY DAY *PATIENT MUST KEEP UPCOMING OFFICE VISIT FOR ADDITIONAL REFILLS*   ezetimibe  10 MG tablet Commonly known as: ZETIA  TAKE 1 TABLET BY MOUTH EVERY DAY   HAIR/SKIN/NAILS PO Take 1 tablet by mouth daily.   losartan  25 MG tablet Commonly known as: COZAAR  TAKE ONE TABLET BY MOUTH DAILY   metFORMIN  500 MG tablet Commonly known as: GLUCOPHAGE  TAKE 1 TABLET BY MOUTH TWICE DAILY *PATIENT MUST KEEP UPCOMING OFFICE VISIT FOR ADDITIONAL REFILLS*   Praluent  150 MG/ML Soaj Generic drug: Alirocumab  INJECT 1 ML (150 MG TOTAL) INTO THE SKIN EVERY 14 (FOURTEEN) DAYS.   pregabalin  75 MG capsule Commonly known as: LYRICA  TAKE 1 CAPSULE BY MOUTH 3 TIMES DAILY   Rexulti  0.5 MG Tabs Generic drug: Brexpiprazole  Take 1 tablet (0.5 mg total) by mouth daily.   tolterodine  4 MG 24 hr capsule Commonly known as: DETROL  LA Take 1 capsule (4 mg total) by mouth daily.        Allergies:  Allergies  Allergen Reactions   Sulfa Antibiotics Itching, Rash and Swelling   Latex Itching    Other reaction(s): angioedema (Gloves when touched by others)   Doxycycline Rash   Empagliflozin  Itching and Other (See Comments)    Itching and frequent yeast infections    Family History: Family History  Problem Relation Age of Onset   Hypertension Mother    Cataracts Mother    Thyroid  disease Mother    Dementia Mother    COPD Father    Dementia Father    Cataracts Father    Aneurysm Father        Abdominal & Brain   Diabetes Father    Melanoma Father    Breast cancer Sister 71   Fibromyalgia Sister    Migraines Sister    Hypertension Sister    Breast cancer Sister 32   COPD Sister    Ovarian cancer Sister 20   Migraines Brother     Heart attack Brother    Colon cancer Paternal Uncle        58s   Colon cancer Paternal Uncle        67s   Uterine cancer Cousin    Uterine cancer Cousin     Social History:  reports that she quit smoking about 27 years ago. Her smoking use included cigarettes. She started smoking about 56 years ago. She has a 29 pack-year smoking history. She has been exposed to tobacco smoke. She has never used smokeless tobacco. She reports that she does not drink alcohol and does not use drugs.  ROS:                                        Physical Exam: BP (!) 168/82 (BP Location: Left Arm, Patient Position: Sitting, Cuff Size: Normal)   Pulse 94   Ht 5' 7 (1.702 m)   Wt 108 kg   LMP  (LMP Unknown) Comment: age 82  SpO2 97%   BMI 37.28 kg/m   Constitutional:  Alert and oriented, No acute distress.   Laboratory Data: Lab Results  Component Value Date  WBC 6.3 06/04/2024   HGB 10.8 (L) 06/04/2024   HCT 35.2 06/04/2024   MCV 80 06/04/2024   PLT 400 06/04/2024    Lab Results  Component Value Date   CREATININE 1.10 (H) 06/04/2024    No results found for: PSA  No results found for: TESTOSTERONE  Lab Results  Component Value Date   HGBA1C 5.9 (H) 06/04/2024    Urinalysis    Component Value Date/Time   COLORURINE STRAW (A) 03/13/2022 2313   APPEARANCEUR Cloudy (A) 01/07/2023 1020   LABSPEC 1.033 (H) 03/13/2022 2313   PHURINE 6.0 03/13/2022 2313   GLUCOSEU Negative 01/07/2023 1020   HGBUR SMALL (A) 03/13/2022 2313   BILIRUBINUR Negative 01/07/2023 1020   KETONESUR NEGATIVE 03/13/2022 2313   PROTEINUR 2+ (A) 01/07/2023 1020   PROTEINUR NEGATIVE 03/13/2022 2313   NITRITE Negative 01/07/2023 1020   NITRITE NEGATIVE 03/13/2022 2313   LEUKOCYTESUR 1+ (A) 01/07/2023 1020   LEUKOCYTESUR NEGATIVE 03/13/2022 2313    Pertinent Imaging: Urine reviewed and sent for culture  Assessment & Plan: Gemtesa  and Detrol  90 x 3 sent to pharmacy and I will see  in 1 year  1. Mixed incontinence (Primary)  - Urinalysis, Complete   No follow-ups on file.  [name] DELENA Elizabeth, MD  [facility] [address], [address] [place], [place] [address] [phone]

## 2024-09-07 NOTE — Progress Notes (Signed)
 Complete physical exam   Patient: [name] [name]   DOB: 1951/10/23   73 y.o. Female  MRN: [id number] Visit Date: 09/07/2024  Today's healthcare provider: Jon Eva, MD   Chief Complaint  Patient presents with   Annual Exam    Last completed 07/01/23; AWV completed 06/06/24 Diet -   Exercise -  Feeling -  Sleeping -  Concerns -     Subjective    [name] [name] is a 73 y.o. female who presents today for a complete physical exam.   Discussed the use of AI scribe software for clinical note transcription with the patient, who gave verbal consent to proceed.  History of Present Illness   [name] [name] is a 73 year old female who presents for an annual physical exam and medication review.  She manages diabetes with metformin  after discontinuing Mounjaro  due to severe side effects. Hypertension is controlled with losartan . Hyperlipidemia is managed with Praluent  injections every two weeks, following a switch from Repatha  due to a latex allergy. Previous treatments with Zetia  and statins were not tolerated.  She experienced nine days of severe diarrhea recently, with an unknown cause. Persistent fatigue is noted, with no known bleeding. There is a family history of constipation.  She is on Cymbalta , Rexulti , and Wellbutrin  for mood. She has ongoing concerns about depression and anxiety, feeling a lack of purpose and companionship since her husband's passing. High anxiety and memory difficulties are attributed to her grief.  She has not completed a mammogram or bone density test due to a migraine on the scheduled day and has not rescheduled. She also has difficulty with colonoscopy preparation due to intolerance of the prep solution.  She experiences frequent memory lapses, such as forgetting tasks mid-action, and is awaiting further discussion of test results with her neurologist in December.        Last depression screening scores    06/04/2024    1:52 PM  06/03/2024   12:40 PM 08/08/2023   10:03 AM  PHQ 2/9 Scores  PHQ - 2 Score 1 1 0  PHQ- 9 Score 8 2 0   Last fall risk screening    06/04/2024    1:48 PM  Fall Risk   Falls in the past year? 1  Number falls in past yr: 1  Injury with Fall? 1        Medications: Outpatient Medications Prior to Visit  Medication Sig   acetaminophen  (TYLENOL ) 650 MG CR tablet Take 650 mg by mouth every 8 (eight) hours as needed for pain.   Biotin  w/ Vitamins C & E (HAIR/SKIN/NAILS PO) Take 1 tablet by mouth daily.   ezetimibe  (ZETIA ) 10 MG tablet TAKE 1 TABLET BY MOUTH EVERY DAY   losartan  (COZAAR ) 25 MG tablet TAKE ONE TABLET BY MOUTH DAILY   metFORMIN  (GLUCOPHAGE ) 500 MG tablet TAKE 1 TABLET BY MOUTH TWICE DAILY *PATIENT MUST KEEP UPCOMING OFFICE VISIT FOR ADDITIONAL REFILLS*   pregabalin  (LYRICA ) 75 MG capsule TAKE 1 CAPSULE BY MOUTH 3 TIMES DAILY   tolterodine  (DETROL  LA) 4 MG 24 hr capsule Take 1 capsule (4 mg total) by mouth daily.   [DISCONTINUED] Alirocumab  (PRALUENT ) 150 MG/ML SOAJ INJECT 1 ML (150 MG TOTAL) INTO THE SKIN EVERY 14 (FOURTEEN) DAYS.   [DISCONTINUED] Brexpiprazole  (REXULTI ) 0.5 MG TABS Take 1 tablet (0.5 mg total) by mouth daily.   [DISCONTINUED] buPROPion  (WELLBUTRIN  XL) 300 MG 24 hr tablet TAKE ONE TABLET BY MOUTH DAILY   [  DISCONTINUED] DULoxetine  (CYMBALTA ) 60 MG capsule TAKE 1 CAPSULE BY MOUTH EVERY DAY *PATIENT MUST KEEP UPCOMING OFFICE VISIT FOR ADDITIONAL REFILLS*   No facility-administered medications prior to visit.    Review of Systems    Objective    BP 133/66 (BP Location: Left Arm, Patient Position: Sitting, Cuff Size: Normal)   Pulse 82   Ht 5' 7 (1.702 m)   Wt 230 lb 6.4 oz (104.5 kg)   LMP  (LMP Unknown) Comment: age 31  SpO2 99%   BMI 36.09 kg/m    Physical Exam Vitals reviewed.  Constitutional:      General: She is not in acute distress.    Appearance: Normal appearance. She is well-developed. She is not diaphoretic.  HENT:     Head:  Normocephalic and atraumatic.     Right Ear: Tympanic membrane, ear canal and external ear normal.     Left Ear: Tympanic membrane, ear canal and external ear normal.     Nose: Nose normal.     Mouth/Throat:     Mouth: Mucous membranes are moist.     Pharynx: Oropharynx is clear. No oropharyngeal exudate.  Eyes:     General: No scleral icterus.    Conjunctiva/sclera: Conjunctivae normal.     Pupils: Pupils are equal, round, and reactive to light.  Neck:     Thyroid : No thyromegaly.  Cardiovascular:     Rate and Rhythm: Normal rate and regular rhythm.     Heart sounds: Normal heart sounds. No murmur heard. Pulmonary:     Effort: Pulmonary effort is normal. No respiratory distress.     Breath sounds: Normal breath sounds. No wheezing or rales.  Abdominal:     General: There is no distension.     Palpations: Abdomen is soft.     Tenderness: There is no abdominal tenderness.  Musculoskeletal:        General: No deformity.     Cervical back: Neck supple.     Right lower leg: No edema.     Left lower leg: No edema.  Lymphadenopathy:     Cervical: No cervical adenopathy.  Skin:    General: Skin is warm and dry.     Findings: No rash.  Neurological:     Mental Status: She is alert and oriented to person, place, and time. Mental status is at baseline.     Gait: Gait normal.  Psychiatric:        Mood and Affect: Mood normal.        Behavior: Behavior normal.        Thought Content: Thought content normal.      No results found for any visits on 09/07/24.  Assessment & Plan    Routine Health Maintenance and Physical Exam  Exercise Activities and Dietary recommendations  Goals      care coordination activites     Care Coordination Interventions:  SDOH screen completed Patient confirmed having no financial needs at this time       CCM Expected Outcome:  Monitor, Self-Manage and Reduce Symptoms of  depression, SI, financial insecurity     DIET - EAT MORE FRUITS AND  VEGETABLES     Pharmacy Goals     Please follow up with the office to schedule follow up kidney function lab work.  Check your blood pressure once daily, and any time you have concerning symptoms like headache, chest pain, dizziness, shortness of breath, or vision changes.   Our goal is less than 130/80.  To appropriately check your blood pressure, make sure you do the following:  1) Avoid caffeine, exercise, or tobacco products for 30 minutes before checking. Empty your bladder. 2) Sit with your back supported in a flat-backed chair. Rest your arm on something flat (arm of the chair, table, etc). 3) Sit still with your feet flat on the floor, resting, for at least 5 minutes.  4) Check your blood pressure. Take 1-2 readings.  5) Write down these readings and bring with you to any provider appointments.  Bring your home blood pressure machine with you to a provider's office for accuracy comparison at least once a year.   Make sure you take your blood pressure medications before you come to any office visit, even if you were asked to fast for labs.  Sharyle Sia, PharmD, [facility] [phone]       Prevent falls     Recommend to remove any items from the home that may cause slips or trips.        Immunization History  Administered Date(s) Administered   Influenza,inj,Quad PF,6+ Mos 09/06/2015, 12/13/2016   Pneumococcal Polysaccharide-23 12/13/2016    Health Maintenance  Topic Date Due   DEXA SCAN  Never done   Mammogram  Never done   DTaP/Tdap/Td (1 - Tdap) Never done   Colonoscopy  Never done   Zoster Vaccines- Shingrix (1 of 2) Never done   Pneumococcal Vaccine: 50+ Years (2 of 2 - PCV) 12/13/2017   OPHTHALMOLOGY EXAM  03/19/2024   Diabetic kidney evaluation - Urine ACR  06/30/2024   FOOT EXAM  06/30/2024   COVID-19 Vaccine (1 - 2024-25 season) Never done   Influenza Vaccine  03/02/2025 (Originally 07/03/2024)   HEMOGLOBIN A1C  12/05/2024    Medicare Annual Wellness (AWV)  06/03/2025   Diabetic kidney evaluation - eGFR measurement  06/04/2025   Hepatitis C Screening  Completed   Meningococcal B Vaccine  Aged Out    Discussed health benefits of physical activity, and encouraged her to engage in regular exercise appropriate for her age and condition.  Problem List Items Addressed This Visit       Cardiovascular and Mediastinum   Hypertension associated with diabetes (HCC)   Relevant Medications   Alirocumab  (PRALUENT ) 150 MG/ML SOAJ   Other Relevant Orders   Comprehensive metabolic panel with GFR     Endocrine   Type 2 diabetes mellitus with complication, without long-term current use of insulin  (HCC)   Hyperlipidemia associated with type 2 diabetes mellitus (HCC)   Relevant Medications   Alirocumab  (PRALUENT ) 150 MG/ML SOAJ   Other Relevant Orders   Comprehensive metabolic panel with GFR   Lipid panel     Other   MDD (major depressive disorder)   Relevant Medications   buPROPion  (WELLBUTRIN  XL) 300 MG 24 hr tablet   DULoxetine  (CYMBALTA ) 60 MG capsule   Other Relevant Orders   Ambulatory referral to Integrated Behavioral Health   Morbid obesity (HCC)   Avitaminosis D   Relevant Orders   VITAMIN D  25 Hydroxy (Vit-D Deficiency, Fractures)   Iron  deficiency anemia   Myalgia due to statin   GAD (generalized anxiety disorder)   Relevant Medications   buPROPion  (WELLBUTRIN  XL) 300 MG 24 hr tablet   DULoxetine  (CYMBALTA ) 60 MG capsule   Other Relevant Orders   Ambulatory referral to Integrated Behavioral Health   Other Visit Diagnoses       Encounter for annual physical exam    -  Primary     Colon cancer screening       Relevant Orders   Ambulatory referral to Gastroenterology     Anemia, unspecified type       Relevant Orders   CBC w/Diff/Platelet   Iron , TIBC and Ferritin Panel           Adult Wellness Visit Annual wellness visit conducted. - Perform foot exam - Order urine test for  diabetes - Discuss vaccinations: COVID, tetanus, shingles, pneumonia, flu - Discuss mammogram and bone density test - Discuss colon cancer screening options  Type 2 diabetes mellitus Managed with metformin . Previous use of Mounjaro  discontinued due to side effects. No current injectable diabetes medication. - Continue metformin  - Perform foot exam - Order urine test for diabetes  Hyperlipidemia Managed with Praluent  due to intolerance to statins and latex allergy with Repatha . Zetia  was previously insufficient. - Continue Praluent  every two weeks - Attempt to obtain a 90-day supply of Praluent  - Recheck cholesterol levels  Hypertension Managed with losartan . - Continue losartan   Depression and generalized anxiety disorder Managed with Cymbalta , Wellbutrin , and Rexulti . Discussed potential increase in Rexulti  dosage. Expresses feelings of loneliness and lack of purpose since husband's passing. Agreed to try therapy. - Continue Cymbalta , Wellbutrin , and Rexulti  - Consider increasing Rexulti  dosage - Refer to in-office therapist for counseling  Peripheral neuropathy  Anemia, unspecified Noted with fatigue. No known bleeding. Suspected iron  deficiency rather than B12 deficiency based on blood counts. - Order iron  panel - Order blood counts - Order colonoscopy to investigate potential bleeding  Vitamin D  deficiency Re-evaluation needed. - Order vitamin D  level  Cognitive impairment, unspecified Cognitive impairment with memory issues. High risk for Alzheimer's. Awaiting further test results from neurologist. - Follow up with neurologist in December  Latex allergy Latex allergy impacting medication administration options. - Avoid latex-containing medications and devices       Return in about 3 months (around 12/08/2024) for chronic disease f/u.     Jon Eva, MD  [facility] [phone] (phone) [phone] (fax)  [facility]

## 2024-09-07 NOTE — Addendum Note (Signed)
 Addended byBETHA CORIE PLATER on: 09/07/2024 10:30 AM   Modules accepted: Orders

## 2024-09-08 DIAGNOSIS — R262 Difficulty in walking, not elsewhere classified: Secondary | ICD-10-CM | POA: Diagnosis not present

## 2024-09-08 DIAGNOSIS — R296 Repeated falls: Secondary | ICD-10-CM | POA: Diagnosis not present

## 2024-09-08 DIAGNOSIS — R278 Other lack of coordination: Secondary | ICD-10-CM | POA: Diagnosis not present

## 2024-09-08 LAB — CBC WITH DIFFERENTIAL/PLATELET
Basophils Absolute: 0.1 x10E3/uL (ref 0.0–0.2)
Basos: 1 %
EOS (ABSOLUTE): 0.3 x10E3/uL (ref 0.0–0.4)
Eos: 4 %
Hematocrit: 32.1 % — ABNORMAL LOW (ref 34.0–46.6)
Hemoglobin: 9.5 g/dL — ABNORMAL LOW (ref 11.1–15.9)
Immature Grans (Abs): 0 x10E3/uL (ref 0.0–0.1)
Immature Granulocytes: 0 %
Lymphocytes Absolute: 1.8 x10E3/uL (ref 0.7–3.1)
Lymphs: 25 %
MCH: 24 pg — ABNORMAL LOW (ref 26.6–33.0)
MCHC: 29.6 g/dL — ABNORMAL LOW (ref 31.5–35.7)
MCV: 81 fL (ref 79–97)
Monocytes Absolute: 0.7 x10E3/uL (ref 0.1–0.9)
Monocytes: 9 %
Neutrophils Absolute: 4.3 x10E3/uL (ref 1.4–7.0)
Neutrophils: 61 %
Platelets: 257 x10E3/uL (ref 150–450)
RBC: 3.96 x10E6/uL (ref 3.77–5.28)
RDW: 15.7 % — ABNORMAL HIGH (ref 11.7–15.4)
WBC: 7.1 x10E3/uL (ref 3.4–10.8)

## 2024-09-08 LAB — COMPREHENSIVE METABOLIC PANEL WITH GFR
ALT: 10 IU/L (ref 0–32)
AST: 15 IU/L (ref 0–40)
Albumin: 3.8 g/dL — ABNORMAL LOW (ref 3.9–4.9)
Alkaline Phosphatase: 110 IU/L (ref 49–135)
BUN/Creatinine Ratio: 22 (ref 12–28)
BUN: 36 mg/dL — ABNORMAL HIGH (ref 8–27)
Bilirubin Total: 0.2 mg/dL (ref 0.0–1.2)
CO2: 26 mmol/L (ref 20–29)
Calcium: 9.1 mg/dL (ref 8.7–10.3)
Chloride: 99 mmol/L (ref 96–106)
Creatinine, Ser: 1.61 mg/dL — ABNORMAL HIGH (ref 0.57–1.00)
Globulin, Total: 3.4 g/dL (ref 1.5–4.5)
Glucose: 112 mg/dL — ABNORMAL HIGH (ref 70–99)
Potassium: 5.4 mmol/L — ABNORMAL HIGH (ref 3.5–5.2)
Sodium: 137 mmol/L (ref 134–144)
Total Protein: 7.2 g/dL (ref 6.0–8.5)
eGFR: 34 mL/min/1.73 — ABNORMAL LOW (ref >59)

## 2024-09-08 LAB — MICROALBUMIN / CREATININE URINE RATIO
Creatinine, Urine: 54.9 mg/dL
Microalb/Creat Ratio: 87 mg/g{creat} — ABNORMAL HIGH (ref 0–29)
Microalbumin, Urine: 47.5 ug/mL

## 2024-09-08 LAB — LIPID PANEL
Chol/HDL Ratio: 3.9 ratio (ref 0.0–4.4)
Cholesterol, Total: 181 mg/dL (ref 100–199)
HDL: 47 mg/dL (ref >39)
LDL Chol Calc (NIH): 113 mg/dL — ABNORMAL HIGH (ref 0–99)
Triglycerides: 118 mg/dL (ref 0–149)
VLDL Cholesterol Cal: 21 mg/dL (ref 5–40)

## 2024-09-08 LAB — IRON,TIBC AND FERRITIN PANEL
Ferritin: 54 ng/mL (ref 15–150)
Iron Saturation: 12 % — ABNORMAL LOW (ref 15–55)
Iron: 34 ug/dL (ref 27–139)
Total Iron Binding Capacity: 288 ug/dL (ref 250–450)
UIBC: 254 ug/dL (ref 118–369)

## 2024-09-08 LAB — VITAMIN D 25 HYDROXY (VIT D DEFICIENCY, FRACTURES): Vit D, 25-Hydroxy: 26.2 ng/mL — ABNORMAL LOW (ref 30.0–100.0)

## 2024-09-08 LAB — HEMOGLOBIN A1C
Est. average glucose Bld gHb Est-mCnc: 123 mg/dL
Hgb A1c MFr Bld: 5.9 % — ABNORMAL HIGH (ref 4.8–5.6)

## 2024-09-09 ENCOUNTER — Ambulatory Visit: Payer: Self-pay | Admitting: Family Medicine

## 2024-09-09 DIAGNOSIS — N289 Disorder of kidney and ureter, unspecified: Secondary | ICD-10-CM

## 2024-09-09 DIAGNOSIS — D649 Anemia, unspecified: Secondary | ICD-10-CM

## 2024-09-10 NOTE — Telephone Encounter (Signed)
 Copied from CRM [id number]. Topic: Clinical - Lab/Test Results >> Sep 10, 2024  4:18 PM Turkey B wrote: Reason for CRM: Patient called in, I gave lab results, and recommendation about iron . Patient states she cant take iron  because she has severe constipation

## 2024-09-11 ENCOUNTER — Telehealth: Payer: Self-pay

## 2024-09-11 LAB — CULTURE, URINE COMPREHENSIVE

## 2024-09-11 NOTE — Telephone Encounter (Signed)
 LMTCB-orders have been placed since 06/2024 for Mammogram and bone density. Patient needs to call to schedule at [phone] for E2C2 to give information to patient.

## 2024-09-11 NOTE — Telephone Encounter (Signed)
 Copied from CRM [id number]. Topic: Referral - Request for Referral >> Sep 10, 2024  4:20 PM Turkey B wrote: Did the patient discuss referral with their provider in the last year? yes    Type of order/referral and detailed reason for visit: routine mammogram  Preference of office, provider, location: ?  If referral order, have you been seen by this specialty before? no  Can we respond through MyChart? yes

## 2024-09-14 ENCOUNTER — Ambulatory Visit: Payer: Self-pay

## 2024-09-14 NOTE — Telephone Encounter (Signed)
 FYI Only or Action Required?: FYI only for provider.  Patient was last seen in primary care on 09/07/2024 by Myrla Jon HERO, MD.  Called Nurse Triage reporting Constipation.  Symptoms began a week ago.  Interventions attempted: OTC medications: Colace, miralax , several other brands of OTC bowel medications.  Symptoms are: unchanged.  Triage Disposition: See Physician Within 24 Hours  Patient/caregiver understands and will follow disposition?: Yes  Copied from CRM #[id number]. Topic: Clinical - Red Word Triage >> Sep 14, 2024  5:07 PM Selinda RAMAN wrote: Red Word that prompted transfer to Nurse Triage: The patient called in stating she is severely constipated and has not had a bowel movement in over a week. I will transfer her to E2C2 NT  Reason for Disposition  Last bowel movement (BM) > 4 days ago  Answer Assessment - Initial Assessment Questions 1. STOOL PATTERN OR FREQUENCY: How often do you have a bowel movement (BM)?  (Normal range: 3 times a day to every 3 days)  When was your last BM?       Last full BM was about 1.5 weeks ago. Prior to that was having frequent diarrhea. Normally has BM 2-3x/week that is between soft and hard consistency.  2. STRAINING: Do you have to strain to have a BM?      Yes  3. ONSET: When did the constipation begin?     1.5 weeks ago  4. RECTAL PAIN: Does your rectum hurt when the stool comes out? If Yes, ask: Do you have hemorrhoids? How bad is the pain?  (Scale 1-10; or mild, moderate, severe)     No  5. BM COMPOSITION: Are the stools hard?      Most that pt has been able to pass is a few drops of green stool.  6. BLOOD ON STOOLS: Has there been any blood on the toilet tissue or on the surface of the BM? If Yes, ask: When was the last time?     No  7. CHRONIC CONSTIPATION: Is this a new problem for you?  If No, ask: How long have you had this problem? (days, weeks, months)      Ongoing for past 20 years  8. CHANGES IN  DIET OR HYDRATION: Have there been any recent changes in your diet? How much fluids are you drinking on a daily basis?  How much have you had to drink today?     No. Pt reports drinking plenty of fluids.  9. MEDICINES: Have you been taking any new medicines? Are you taking any narcotic pain medicines? (e.g., Dilaudid , morphine , Percocet, Vicodin)     No  10. LAXATIVES: Have you been using any stool softeners, laxatives, or enemas?  If Yes, ask What are you using, how often, and when was the last time?       Normally takes a stool softener everyday, this episode of constipation has also tried colace, miralax  and various other brands of OTC bowel medication  11. ACTIVITY:  How much walking do you do every day?  Has your activity level decreased in the past week?        Not active right now d/t other chronic health issues including back and neck pain.  12. CAUSE: What do you think is causing the constipation?        Unsure  13. MEDICAL HISTORY: Do you have a history of hemorrhoids, rectal fissures, rectal surgery, or rectal abscess?         Rectal fissure 30 years  ago. 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  14. OTHER SYMPTOMS: Do you have any other symptoms? (e.g., abdomen pain, bloating, fever, vomiting)       No nausea or pain, pt reports belly is distended. Denies fever.  Protocols used: Constipation-A-AH

## 2024-09-14 NOTE — Progress Notes (Signed)
 MANAR SMALLING                                          MRN: [id number]   09/14/2024   The [facility] Quality Team Specialist reviewed this patient medical record for the purposes of chart review for care gap closure. The following were reviewed: chart review for care gap closure-colorectal cancer screening.    [facility]BCI Quality Team

## 2024-09-14 NOTE — Progress Notes (Signed)
 [name] [name]                                          MRN: [id number]   09/14/2024   The [facility] Quality Team Specialist reviewed this patient medical record for the purposes of chart review for care gap closure. The following were reviewed: abstraction for care gap closure-kidney health evaluation for diabetes:eGFR  and uACR.    [facility]I Quality Team

## 2024-09-14 NOTE — Progress Notes (Signed)
 [name] [name]                                          MRN: [id number]   09/14/2024   The [facility]BCI Quality Team Specialist reviewed this patient medical record for the purposes of chart review for care gap closure. The following were reviewed: chart review for care gap closure-breast cancer screening.    [facility]BCI Quality Team

## 2024-09-15 ENCOUNTER — Ambulatory Visit (INDEPENDENT_AMBULATORY_CARE_PROVIDER_SITE_OTHER): Admitting: Family Medicine

## 2024-09-15 ENCOUNTER — Ambulatory Visit: Payer: Self-pay

## 2024-09-15 VITALS — BP 147/82 | HR 90 | Temp 99.3°F | Ht 67.0 in | Wt 238.2 lb

## 2024-09-15 DIAGNOSIS — R296 Repeated falls: Secondary | ICD-10-CM | POA: Diagnosis not present

## 2024-09-15 DIAGNOSIS — R278 Other lack of coordination: Secondary | ICD-10-CM | POA: Diagnosis not present

## 2024-09-15 DIAGNOSIS — K5909 Other constipation: Secondary | ICD-10-CM

## 2024-09-15 DIAGNOSIS — N3946 Mixed incontinence: Secondary | ICD-10-CM

## 2024-09-15 DIAGNOSIS — R262 Difficulty in walking, not elsewhere classified: Secondary | ICD-10-CM | POA: Diagnosis not present

## 2024-09-15 MED ORDER — LUBIPROSTONE 24 MCG PO CAPS: 24.0000 ug | ORAL_CAPSULE | Freq: Every day | ORAL | 0 refills | Status: DC | PRN

## 2024-09-15 NOTE — Progress Notes (Signed)
 Established patient visit   Patient: [name] [name]   DOB: [birth year]   73 y.o. Female  MRN: [id number] Visit Date: 09/15/2024  Today's healthcare provider: LAURAINE LOISE BUOY, DO   Chief Complaint  Patient presents with   Acute Visit    Patient is here for constipation, stated it has been going on for years.     Subjective    HPI [name] [name] is a 73 year old female who presents with chronic constipation.  She has not had a bowel movement in over a week despite using various treatments. She has been taking Colace twice daily for about two months and a generic [facility] stool softener, taking two capsules in the morning (determined to also be docusate sodium ). She has also tried Fleet Suppositories, which do not stay in, and Dulcolax, which she last used about a month ago which she has not used for the current episode. Previously, Miralax  was effective but has since stopped working. She describes her abdomen as 'hard as a rock' and feels miserable.  Typically, she has at least one bowel movement per day, sometimes two, but has not had one in nine to ten days. About three weeks ago, she experienced a similar issue but then resumed normal bowel movements until the current episode. Approximately a month ago, she had diarrhea for a week, which resolved but was followed by the current episode of constipation.  She drinks plenty of water throughout the day and night, occasionally having tea when dining out. She states that her constipation is unaffected by her diet, including fruits and vegetables.  Her family history includes similar constipation issues in her mother and three sisters.  She has been on pregabalin  for years, though the exact duration is unknown.  She recently fell and hit her face on cement, resulting in swelling and damage to her glasses, but no fever or chills were reported. She feels 'really full' and 'miserable'.      Medications: Outpatient Medications Prior  to Visit  Medication Sig   acetaminophen  (TYLENOL ) 650 MG CR tablet Take 650 mg by mouth every 8 (eight) hours as needed for pain.   Alirocumab  (PRALUENT ) 150 MG/ML SOAJ Inject 1 mL (150 mg total) into the skin every 14 (fourteen) days.   Biotin  w/ Vitamins C & E (HAIR/SKIN/NAILS PO) Take 1 tablet by mouth daily.   Brexpiprazole  (REXULTI ) 0.5 MG TABS Take 2 tablets (1 mg total) by mouth daily.   buPROPion  (WELLBUTRIN  XL) 300 MG 24 hr tablet Take 1 tablet (300 mg total) by mouth daily.   DULoxetine  (CYMBALTA ) 60 MG capsule Take 1 capsule (60 mg total) by mouth daily.   ezetimibe  (ZETIA ) 10 MG tablet TAKE 1 TABLET BY MOUTH EVERY DAY   losartan  (COZAAR ) 25 MG tablet TAKE ONE TABLET BY MOUTH DAILY   metFORMIN  (GLUCOPHAGE ) 500 MG tablet TAKE 1 TABLET BY MOUTH TWICE DAILY *PATIENT MUST KEEP UPCOMING OFFICE VISIT FOR ADDITIONAL REFILLS*   pregabalin  (LYRICA ) 75 MG capsule TAKE 1 CAPSULE BY MOUTH 3 TIMES DAILY   tolterodine  (DETROL  LA) 4 MG 24 hr capsule Take 1 capsule (4 mg total) by mouth daily.   Vibegron  (GEMTESA ) 75 MG TABS Take 1 tablet (75 mg total) by mouth daily.   No facility-administered medications prior to visit.        Objective    BP (!) 147/82 (BP Location: Left Arm, Patient Position: Sitting, Cuff Size: Normal) Comment (BP Location): Forearm  Pulse 90  Temp 99.3 F (37.4 C) (Oral)   Ht 5' 7 (1.702 m)   Wt 238 lb 3.2 oz (108 kg)   LMP  (LMP Unknown) Comment: age 74  SpO2 100%   BMI 37.31 kg/m     Physical Exam Vitals and nursing note reviewed.  Constitutional:      General: She is not in acute distress.    Appearance: Normal appearance.  HENT:     Head: Normocephalic and atraumatic.  Eyes:     General: No scleral icterus.    Conjunctiva/sclera: Conjunctivae normal.  Cardiovascular:     Rate and Rhythm: Normal rate.  Pulmonary:     Effort: Pulmonary effort is normal.  Abdominal:     General: There is no distension.     Palpations: Abdomen is soft. There is  no mass.     Tenderness: There is no abdominal tenderness. There is no guarding.  Neurological:     Mental Status: She is alert and oriented to person, place, and time. Mental status is at baseline.  Psychiatric:        Mood and Affect: Mood normal.        Behavior: Behavior normal.      No results found for any visits on 09/15/24.  Assessment & Plan    Chronic constipation -     Lubiprostone; Take 1 capsule (24 mcg total) by mouth daily as needed for constipation. Take with a meal  Dispense: 30 capsule; Refill: 0    Chronic constipation Constipation unresponsive to multiple treatments. Adequate hydration and fiber intake ineffective. Family history noted. - Prescribed Amitiza (lubiprostone) once daily as needed. Monitor insurance coverage and adjust if necessary. - If Amitiza not covered, may consider OTC bisacodyl, starting with one tablet daily as needed, cautioning against excessive dosing to prevent severe diarrhea. - Instructed to contact office for medication coverage issues or persistent symptoms.    Return if symptoms worsen or fail to improve.      I discussed the assessment and treatment plan with the patient  The patient was provided an opportunity to ask questions and all were answered. The patient agreed with the plan and demonstrated an understanding of the instructions.   The patient was advised to call back or seek an in-person evaluation if the symptoms worsen or if the condition fails to improve as anticipated.    LAURAINE LOISE BUOY, DO  [facility] [phone] (phone) [phone] (fax)  [facility]

## 2024-09-15 NOTE — Telephone Encounter (Signed)
 Patient reports that she spoke with someone yesterday and was given the phone number. Closing encounter

## 2024-09-15 NOTE — Patient Instructions (Addendum)
 If unable to get the prescription, while waiting for a change from the clinic, you could try taking one tablet of bisacodyl.

## 2024-09-17 MED ORDER — NITROFURANTOIN MACROCRYSTAL 100 MG PO CAPS: 100.0000 mg | ORAL_CAPSULE | Freq: Two times a day (BID) | ORAL | 0 refills | Status: AC

## 2024-09-18 DIAGNOSIS — R296 Repeated falls: Secondary | ICD-10-CM | POA: Diagnosis not present

## 2024-09-18 DIAGNOSIS — R262 Difficulty in walking, not elsewhere classified: Secondary | ICD-10-CM | POA: Diagnosis not present

## 2024-09-18 DIAGNOSIS — R278 Other lack of coordination: Secondary | ICD-10-CM | POA: Diagnosis not present

## 2024-09-23 ENCOUNTER — Encounter: Payer: Self-pay | Admitting: Oncology

## 2024-09-23 ENCOUNTER — Inpatient Hospital Stay: Attending: Oncology | Admitting: Oncology

## 2024-09-23 ENCOUNTER — Ambulatory Visit

## 2024-09-23 ENCOUNTER — Inpatient Hospital Stay

## 2024-09-23 VITALS — BP 130/90 | HR 83 | Temp 98.9°F | Resp 18 | Ht 67.0 in | Wt 248.0 lb

## 2024-09-23 DIAGNOSIS — D509 Iron deficiency anemia, unspecified: Secondary | ICD-10-CM | POA: Diagnosis not present

## 2024-09-23 DIAGNOSIS — N289 Disorder of kidney and ureter, unspecified: Secondary | ICD-10-CM | POA: Insufficient documentation

## 2024-09-23 DIAGNOSIS — D649 Anemia, unspecified: Secondary | ICD-10-CM

## 2024-09-23 NOTE — Progress Notes (Unsigned)
Patient is feeling very fatigued.

## 2024-09-23 NOTE — Progress Notes (Unsigned)
 [facility]  [phone] [phone]) [phone]  ID: [name] OB: [birth year]  MR#: [id number]  CSN#:[id number]  Patient Care Team: Myrla Jon HERO, MD as PCP - General (Family Medicine) Gaston Hamilton, MD as Consulting Physician (Urology) Christobal Selinda HERO, OD (Optometry) Dillingham, Estefana RAMAN, DO as Attending Physician (Plastic Surgery) Hester Wolm PARAS, MD as Consulting Physician (Cardiology) Hester Wolm PARAS, MD as Consulting Physician (Cardiology)  CHIEF COMPLAINT: Iron  deficiency anemia.  INTERVAL HISTORY: Patient is a 73 year old female who was noted to have reduced hemoglobin and iron  stores and was referred for further evaluation and consideration of IV iron .  She has chronic weakness and fatigue, but otherwise feels well.  She has no neurologic complaints.  She denies any recent fevers or illnesses.  She has a good appetite and denies weight loss.  She has no chest pain, shortness of breath, cough, or hemoptysis.  She denies any nausea, vomiting, constipation, or diarrhea.  She denies any melena or hematochezia.  She has no urinary complaints.  Patient offers no further specific complaints today.  REVIEW OF SYSTEMS:   Review of Systems  Constitutional:  Positive for malaise/fatigue. Negative for fever and weight loss.  Respiratory: Negative.  Negative for cough, hemoptysis and shortness of breath.   Cardiovascular:  Positive for leg swelling. Negative for chest pain.  Gastrointestinal: Negative.  Negative for abdominal pain, blood in stool and melena.  Genitourinary: Negative.  Negative for dysuria.  Musculoskeletal: Negative.  Negative for back pain.  Skin: Negative.  Negative for rash.  Neurological: Negative.  Negative for dizziness, focal weakness, weakness and headaches.  Psychiatric/Behavioral: Negative.  The patient is not nervous/anxious.     As per HPI. Otherwise, a complete review of systems is negative.  PAST MEDICAL  HISTORY: Past Medical History:  Diagnosis Date   Anxiety    Arthritis    Rhumetoid arthritis   BRCA negative 07/2017   MyRisk neg   Cancer (HCC)    uterine   Cellulitis of both lower extremities    Chronic   CHF (congestive heart failure) (HCC)    Coronary artery disease    COVID-19 11/2020   Depression    Diabetes mellitus without complication (HCC)    Family history of breast cancer 07/2017   MyRisk neg; IBIS=10.5%/riskscore=17%   Family history of ovarian cancer    Fibromyalgia    Fibromyalgia affecting shoulder region    Headache    Hyperlipidemia    Hypertension    Spinal stenosis    Torn rotator cuff    bilateral   Wears dentures    full upper    PAST SURGICAL HISTORY: Past Surgical History:  Procedure Laterality Date   ABLATION SAPHENOUS VEIN W/ RFA     ANAL FISSURECTOMY  01/2004   CATARACT EXTRACTION W/PHACO Left 06/13/2023   Procedure: CATARACT EXTRACTION PHACO AND INTRAOCULAR LENS PLACEMENT (IOC) LEFT DIABETIC  5.79  00:42.7;  Surgeon: Enola Feliciano Hugger, MD;  Location: [facility];  Service: Ophthalmology;  Laterality: Left;   CATARACT EXTRACTION W/PHACO Right 06/27/2023   Procedure: CATARACT EXTRACTION PHACO AND INTRAOCULAR LENS PLACEMENT (IOC) RIGHT DIABETIC 4.21 00.39.8;  Surgeon: Enola Feliciano Hugger, MD;  Location: [facility];  Service: Ophthalmology;  Laterality: Right;   COLON SURGERY     CORONARY ANGIOPLASTY     CYSTOSCOPY  09/03/2017   Procedure: CYSTOSCOPY;  Surgeon: Arloa Lamar SQUIBB, MD;  Location: [facility] ORS;  Service: Gynecology;;   DILATION AND CURETTAGE OF UTERUS  ESOPHAGEAL DILATION  02/20/2022   Procedure: ESOPHAGEAL DILATION;  Surgeon: Rollin Dover, MD;  Location[facility] END[facility];  Service: Gastroenterology;;  savory   ESOPHAGOGASTRODUODENOSCOPY (EGD) WITH PROPOFOL  N/A 02/20/2022   Procedure: ESOPHAGOGASTRODUODENOSCOPY (EGD) WITH PROPOFOL ;  Surgeon: Rollin Dover, MD;  Location: [facility] ENDOSCOPY;  Service:  Gastroenterology;  Laterality: N/A;  Dyspahgia   EYE SURGERY Bilateral 08/2020   eye lift   HERNIA REPAIR     INCISION AND DRAINAGE OF WOUND  03/04/2022   Procedure: IRRIGATION AND DEBRIDEMENT ABDOMEN WITH VAC PLACEMENT, MYRIAD PLACEMENT;  Surgeon: Lowery Estefana RAMAN, DO;  Location: [facility] OR;  Service: Plastics;;   LAPAROSCOPIC HYSTERECTOMY N/A 09/03/2017   Procedure: HYSTERECTOMY TOTAL LAPAROSCOPIC BSO;  Surgeon: Arloa Lamar SQUIBB, MD;  Location: [facility] ORS;  Service: Gynecology;  Laterality: N/A;   MEDIAL PARTIAL KNEE REPLACEMENT Left 01/06/2014   PANNICULECTOMY N/A 02/05/2022   Procedure: PANNICULECTOMY;  Surgeon: Lowery Estefana RAMAN, DO;  Location: [facility] OR;  Service: Plastics;  Laterality: N/A;  3 hours   total arthroplasty of left shoulder Left 05/04/2023   Dr. Leora   UMBILICAL HERNIA REPAIR N/A 09/05/2015   Procedure: HERNIA REPAIR incarcerated UMBILICAL ADULT;  Surgeon: Oneil Chang, MD;  Location: [facility] ORS;  Service: General;  Laterality: N/A;   Vascular Stent  11/04/2013    FAMILY HISTORY: Family History  Problem Relation Age of Onset   Hypertension Mother    Cataracts Mother    Thyroid  disease Mother    Dementia Mother    COPD Father    Dementia Father    Cataracts Father    Aneurysm Father        Abdominal & Brain   Diabetes Father    Melanoma Father    Breast cancer Sister 38   Fibromyalgia Sister    Migraines Sister    Hypertension Sister    Breast cancer Sister 54   COPD Sister    Ovarian cancer Sister 25   Migraines Brother    Heart attack Brother    Colon cancer Paternal Uncle        25s   Colon cancer Paternal Uncle        79s   Uterine cancer Cousin    Uterine cancer Cousin     ADVANCED DIRECTIVES (Y/N):  N  HEALTH MAINTENANCE: Social History   Tobacco Use   Smoking status: Former    Current packs/day: 0.00    Average packs/day: 1 pack/day for 29.0 years (29.0 ttl pk-yrs)    Types: Cigarettes    Start date: 12/04/1967    Quit date: 12/03/1996     Years since quitting: 27.8    Passive exposure: Past   Smokeless tobacco: Never  Vaping Use   Vaping status: Never Used  Substance Use Topics   Alcohol use: No   Drug use: No     Colonoscopy:  PAP:  Bone density:  Lipid panel:  Allergies  Allergen Reactions   Sulfa Antibiotics Itching, Rash and Swelling   Latex Itching    Other reaction(s): angioedema (Gloves when touched by others)   Doxycycline Rash   Empagliflozin  Itching and Other (See Comments)    Itching and frequent yeast infections    Current Outpatient Medications  Medication Sig Dispense Refill   acetaminophen  (TYLENOL ) 650 MG CR tablet Take 650 mg by mouth every 8 (eight) hours as needed for pain.     Alirocumab  (PRALUENT ) 150 MG/ML SOAJ Inject 1 mL (150 mg total) into the skin every 14 (fourteen) days. 6 mL 3  Biotin  w/ Vitamins C & E (HAIR/SKIN/NAILS PO) Take 1 tablet by mouth daily.     Brexpiprazole  (REXULTI ) 0.5 MG TABS Take 2 tablets (1 mg total) by mouth daily. 180 tablet 1   buPROPion  (WELLBUTRIN  XL) 300 MG 24 hr tablet Take 1 tablet (300 mg total) by mouth daily. 90 tablet 1   DULoxetine  (CYMBALTA ) 60 MG capsule Take 1 capsule (60 mg total) by mouth daily. 90 capsule 1   ezetimibe  (ZETIA ) 10 MG tablet TAKE 1 TABLET BY MOUTH EVERY DAY 90 tablet 1   losartan  (COZAAR ) 25 MG tablet TAKE ONE TABLET BY MOUTH DAILY 90 tablet 1   lubiprostone (AMITIZA) 24 MCG capsule Take 1 capsule (24 mcg total) by mouth daily as needed for constipation. Take with a meal 30 capsule 0   metFORMIN  (GLUCOPHAGE ) 500 MG tablet TAKE 1 TABLET BY MOUTH TWICE DAILY *PATIENT MUST KEEP UPCOMING OFFICE VISIT FOR ADDITIONAL REFILLS* 180 tablet 1   nitrofurantoin  (MACRODANTIN ) 100 MG capsule Take 1 capsule (100 mg total) by mouth 2 (two) times daily for 7 days. 14 capsule 0   pregabalin  (LYRICA ) 75 MG capsule TAKE 1 CAPSULE BY MOUTH 3 TIMES DAILY 90 capsule 4   tolterodine  (DETROL  LA) 4 MG 24 hr capsule Take 1 capsule (4 mg total) by mouth  daily. 30 capsule 3   Vibegron  (GEMTESA ) 75 MG TABS Take 1 tablet (75 mg total) by mouth daily. 90 tablet 3   No current facility-administered medications for this visit.    OBJECTIVE: Vitals:   09/23/24 1318  BP: (!) 130/90  Pulse: 83  Resp: 18  Temp: 98.9 F (37.2 C)  SpO2: 94%     Body mass index is 38.84 kg/m.    ECOG FS:1 - Symptomatic but completely ambulatory  General: Well-developed, well-nourished, no acute distress.  Sitting in wheelchair. Eyes: Pink conjunctiva, anicteric sclera. HEENT: Normocephalic, moist mucous membranes. Lungs: No audible wheezing or coughing. Heart: Regular rate and rhythm. Abdomen: Soft, nontender, no obvious distention. Musculoskeletal: No edema, cyanosis, or clubbing. Neuro: Alert, answering all questions appropriately. Cranial nerves grossly intact. Skin: No rashes or petechiae noted. Psych: Normal affect. Lymphatics: No cervical, calvicular, axillary or inguinal LAD.   LAB RESULTS:  Lab Results  Component Value Date   NA 137 09/07/2024   K 5.4 (H) 09/07/2024   CL 99 09/07/2024   CO2 26 09/07/2024   GLUCOSE 112 (H) 09/07/2024   BUN 36 (H) 09/07/2024   CREATININE 1.61 (H) 09/07/2024   CALCIUM  9.1 09/07/2024   PROT 7.2 09/07/2024   ALBUMIN  3.8 (L) 09/07/2024   AST 15 09/07/2024   ALT 10 09/07/2024   ALKPHOS 110 09/07/2024   BILITOT 0.2 09/07/2024   GFRNONAA >60 04/05/2022   GFRAA 72 12/21/2020    Lab Results  Component Value Date   WBC 7.1 09/07/2024   NEUTROABS 4.3 09/07/2024   HGB 9.5 (L) 09/07/2024   HCT 32.1 (L) 09/07/2024   MCV 81 09/07/2024   PLT 257 09/07/2024   Lab Results  Component Value Date   IRON  34 09/07/2024   TIBC 288 09/07/2024   IRONPCTSAT 12 (L) 09/07/2024   Lab Results  Component Value Date   FERRITIN 54 09/07/2024     STUDIES: MR BRAIN WO CONTRAST Result Date: 08/31/2024 EXAM: MRI BRAIN WITHOUT CONTRAST 08/30/2024 01:26:11 PM TECHNIQUE: Multiplanar multisequence MRI of the head/brain  was performed without the administration of intravenous contrast. COMPARISON: None available. CLINICAL HISTORY: Pt complains of memory changes and dizziness x 2 months.  FINDINGS: BRAIN AND VENTRICLES: No acute infarct. No intracranial hemorrhage. No mass. No midline shift. Multifocal hyperintense T2-weighted signal within the cerebral white matter, most commonly due to chronic small vessel disease. Volumetric imaging was performed; the volumes of the hippocampi, cortical gray matter, and superior lateral ventricles are outside 2 standard deviations of the mean for age-matched controls. Complete volumetric data stored in PACS. Incidentally noted partially empty sella turcica. Normal flow voids. ORBITS: Ocular lens replacements. No acute abnormality. SINUSES AND MASTOIDS: No acute abnormality. BONES AND SOFT TISSUES: Normal marrow signal. No acute soft tissue abnormality. IMPRESSION: 1. Volumetric imaging shows volumes of the hippocampi, cortical gray matter, and superior lateral ventricles outside 2 standard deviations of the mean for age-matched controls. Complete volumetric data stored in PACS. 2. Multifocal hyperintense T2-weighted signal within the cerebral white matter, most commonly due to chronic small vessel disease. 3. No acute intracranial abnormality. Electronically signed by: Franky Stanford MD 08/31/2024 11:10 AM EDT RP Workstation: [id number]    ASSESSMENT: Iron  deficiency anemia.  PLAN:    Iron  deficiency anemia: Patient's hemoglobin iron  stores are reduced and patient is symptomatic.  Return to clinic 4 times over the next 1 to 2 weeks to receive 200 mg IV Venofer.  Patient will then return to clinic in 3 months with repeat laboratory, further evaluation, and continuation of treatment if needed.  Will do full anemia workup at that time. Renal insufficiency: Patient's most recent creatinine is 1.61.  Once her iron  stores are replaced, she may benefit from Retacrit if her hemoglobin remains below  10.0.  I spent a total of 45 minutes reviewing chart data, face-to-face evaluation with the patient, counseling and coordination of care as detailed above.   Patient expressed understanding and was in agreement with this plan. She also understands that She can call clinic at any time with any questions, concerns, or complaints.    Evalene JINNY Reusing, MD   09/24/2024 8:52 AM

## 2024-09-23 NOTE — Progress Notes (Deleted)
 S:     No chief complaint on file.   Reason for visit: ?  [name] [name] is a 73 y.o. female with a history of diabetes (type 2), who presents today for an initial diabetes and HLD pharmacotherapy visit.? Pertinent PMH also includesT2DM, HTN, CHF, CAD, migraines, and PUD.   Care Team: Primary Care Provider: Myrla Jon HERO, MD  At last visit with PCP on 09/07/24, LDL had improved from 174 mg/dL to 886 mg/dL.   Current diabetes medications include: metformin  500 mg BID Previous diabetes medications include: Mounjaro  (general unwellness), Trulicity  (switched to Mounjaro ), glipizide , Jardiance  (recurrent GU infections) Current hypertension medications include: losartan  25 mg daily Current hyperlipidemia medications include: Praluent  140 mg every 2 weeks  Patient reports adherence to taking all medications as prescribed. All medications are received via pill packaging. When reviewing medication list today, there were 2 medications she did not have in her pill package: ezetimibe  and Rexulti .   Do you have any problems obtaining medications due to transportation or finances? no Insurance coverage: [employer] Medicare  Reported home fasting blood sugars: 90-110 mg/dL  Patient denies hypoglycemia.   Patient denies nocturia (nighttime urination).  Patient denies neuropathy (nerve pain).  Patient reported dietary habits: Eats 1-2 meals/day Dinner: vegetables and chicken  Drinks: cranberry juice, water, zero sugar sodas   Hyperlipidemia:  Lipid-lowering medications tried in the past:?  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, last UACR on 09/07/24 = 87 mg/g Last eye exam: 01/2024 Lab Results  Component Value Date   HMDIABEYEEXA Retinopathy (A) 03/20/2023   Tobacco Use:  Tobacco Use: Medium Risk (09/15/2024)   Patient History    Smoking Tobacco Use: Former     Smokeless Tobacco Use: Never    Passive Exposure: Past   O:  Vitals:  Wt Readings from Last 3 Encounters:  09/15/24 238 lb 3.2 oz (108 kg)  09/07/24 230 lb 6.4 oz (104.5 kg)  09/07/24 238 lb (108 kg)   BP Readings from Last 3 Encounters:  09/15/24 (!) 147/82  09/07/24 133/66  09/07/24 (!) 168/82   Pulse Readings from Last 3 Encounters:  09/15/24 90  09/07/24 82  09/07/24 94     Labs:?  Lab Results  Component Value Date   HGBA1C 5.9 (H) 09/07/2024   HGBA1C 5.9 (H) 06/04/2024   HGBA1C 5.8 (H) 07/01/2023   GLUCOSE 112 (H) 09/07/2024   MICRALBCREAT 87 (H) 09/07/2024   MICRALBCREAT 40 (H) 07/01/2023   MICRALBCREAT 21 04/19/2022   CREATININE 1.61 (H) 09/07/2024   CREATININE 1.10 (H) 06/04/2024   CREATININE 0.83 07/01/2023   GFR 113.78 12/13/2016    Lab Results  Component Value Date   CHOL 181 09/07/2024   LDLCALC 113 (H) 09/07/2024   LDLCALC 174 (H) 06/04/2024   LDLCALC 164 (H) 07/01/2023   LDLDIRECT 183.0 12/13/2016   HDL 47 09/07/2024   TRIG 118 09/07/2024   TRIG 252 (H) 06/04/2024   TRIG 197 (H) 07/01/2023   ALT 10 09/07/2024   ALT 10 06/04/2024   AST 15 09/07/2024   AST 14 06/04/2024      Chemistry      Component Value Date/Time   NA 137 09/07/2024 1242   NA 138 08/31/2014 0533   K 5.4 (H) 09/07/2024 1242   K 4.0 08/31/2014 0533   CL 99 09/07/2024 1242   CL 104 08/31/2014 0533  CO2 26 09/07/2024 1242   CO2 28 08/31/2014 0533   BUN 36 (H) 09/07/2024 1242   BUN 12 08/31/2014 0533   CREATININE 1.61 (H) 09/07/2024 1242   CREATININE 0.63 08/31/2014 0533      Component Value Date/Time   CALCIUM  9.1 09/07/2024 1242   CALCIUM  8.2 (L) 08/31/2014 0533   ALKPHOS 110 09/07/2024 1242   AST 15 09/07/2024 1242   ALT 10 09/07/2024 1242   BILITOT 0.2 09/07/2024 1242       The ASCVD Risk score (Arnett DK, et al., 2019) failed to calculate for the following reasons:   Risk score cannot be calculated because patient has a medical history suggesting  prior/existing ASCVD  Lab Results  Component Value Date   MICRALBCREAT 87 (H) 09/07/2024   MICRALBCREAT 40 (H) 07/01/2023   MICRALBCREAT 21 04/19/2022    A/P:  Hyperlipidemia (secondary prevention): Uncontrolled, but improving based on last lipid panel (09/07/24): TC 181 mg/dL, LDL 886 mg/dL, TG 881 mg/dL. LDL goal <55 mg/dL (secondary prevention).  - Current Regimen: Praluent  150 mg every 14 days - Previous therapies: rosuvastatin  (daily and three times weekly), simvastatin , ezetimibe  -Patient educated on purpose, proper use, and potential adverse effects of Praulent.  - The ASCVD Risk score (Arnett DK, et al., 2019) failed to calculate for the following reasons:   Risk score cannot be calculated because patient has a medical history suggesting prior/existing ASCVD  Consider re-initiation of ezetimibe  at follow up appointment       Future Consideration: Bempedoic acid (Nexletol):  CLEAR-Harmony trial  Diabetes currently controlled with a most recent A1c of 5.9% on 09/07/24. Patient is able to verbalize appropriate hypoglycemia management plan. Medication adherence appears appropriate. *** -Continued metformin  500 mg BID.  -Extensively discussed pathophysiology of diabetes, recommended lifestyle interventions, dietary effects on blood sugar control.  -Counseled on s/sx of and management of hypoglycemia.  -Next A1c anticipated ***   Written patient instructions provided. Patient verbalized understanding of treatment plan.  Total time in face to face counseling *** minutes.     Follow-up:  Pharmacist on *** PCP clinic visit on ***  [name], PharmD Clinical Pharmacist [facility] [phone]

## 2024-09-24 ENCOUNTER — Encounter: Payer: Self-pay | Admitting: Oncology

## 2024-09-28 ENCOUNTER — Inpatient Hospital Stay

## 2024-09-28 VITALS — BP 102/61 | HR 77 | Temp 97.9°F | Resp 17

## 2024-09-28 DIAGNOSIS — D509 Iron deficiency anemia, unspecified: Secondary | ICD-10-CM | POA: Diagnosis not present

## 2024-09-28 MED ORDER — SODIUM CHLORIDE 0.9% FLUSH
10.0000 mL | Freq: Once | INTRAVENOUS | Status: DC | PRN
  Filled 2024-09-28: qty 10

## 2024-09-28 MED ORDER — IRON SUCROSE 20 MG/ML IV SOLN
200.0000 mg | Freq: Once | INTRAVENOUS | Status: AC
  Administered 2024-09-28: 200 mg via INTRAVENOUS
  Filled 2024-09-28: qty 10

## 2024-09-28 NOTE — Patient Instructions (Signed)

## 2024-09-29 ENCOUNTER — Telehealth: Payer: Self-pay | Admitting: Pharmacist

## 2024-09-29 NOTE — Progress Notes (Signed)
 Pharmacy Quality Measure Review  This patient is appearing on the insurance-providing list for being at risk of failing the adherence measure for Statin Use in Persons with Diabetes (SUPD) medications this calendar year.   Per embedded pharmacist documentation, patient previously tried and failed several statin medications, including rosuvastatin  3 days weekly. Currently on Praluent  and ezetimibe . Previous history of myalgia due to statin therapy will not exclude from the Statin Use in Persons with Diabetes measure.   Recommend to continue therapy with Praluent  and ezetimibe  at this time.   Catie IVAR Centers, PharmD, [facility] Clinical Pharmacist [phone]

## 2024-10-01 ENCOUNTER — Other Ambulatory Visit: Payer: Self-pay | Admitting: Family Medicine

## 2024-10-01 DIAGNOSIS — E1169 Type 2 diabetes mellitus with other specified complication: Secondary | ICD-10-CM

## 2024-10-03 NOTE — Telephone Encounter (Signed)
 Discontinued 08/07/24.  Requested Prescriptions  Pending Prescriptions Disp Refills   REPATHA  SURECLICK 140 MG/ML SOAJ [Pharmacy Med Name: REPATHA  140MG /ML SURECLICK 140 Solution Auto-injector] 2 mL 11    Sig: INJECT 140MG  SUBCUTANEOUSLY EVERY (14) DAYS     Cardiovascular: PCSK9 Inhibitors Passed - 10/03/2024  8:32 AM      Passed - Valid encounter within last 12 months    Recent Outpatient Visits           2 weeks ago Chronic constipation   [facility] [place], Lauraine SAILOR, DO   3 weeks ago Encounter for annual physical exam   [facility] [place], Jon HERO, MD   4 months ago Hypertension associated with diabetes [facility])   [place] [facility] [place], Jon HERO, MD       Future Appointments             In 11 months MacDiarmid, [place], MD [facility] [place]            Passed - Lipid Panel completed within the last 12 months    Cholesterol, Total  Date Value Ref Range Status  09/07/2024 181 100 - 199 mg/dL Final   LDL Chol Calc (NIH)  Date Value Ref Range Status  09/07/2024 113 (H) 0 - 99 mg/dL Final   Direct LDL  Date Value Ref Range Status  12/13/2016 183.0 mg/dL Final    Comment:    Optimal:  <100 mg/dLNear or Above Optimal:  100-129 mg/dLBorderline High:  130-159 mg/dLHigh:  160-189 mg/dLVery High:  >190 mg/dL   HDL  Date Value Ref Range Status  09/07/2024 47 >39 mg/dL Final   Triglycerides  Date Value Ref Range Status  09/07/2024 118 0 - 149 mg/dL Final

## 2024-10-05 ENCOUNTER — Telehealth: Payer: Self-pay

## 2024-10-05 NOTE — Telephone Encounter (Unsigned)
 Copied from CRM #[id number]. Topic: Clinical - Prescription Issue >> Oct 05, 2024  1:35 PM Avram MATSU wrote: Reason for CRM: cosmo is calling to confirm patient rx direction for Brexpiprazole  (REXULTI ) 0.5 MG TABS [[id number]]  please advise [phone]

## 2024-10-06 ENCOUNTER — Other Ambulatory Visit: Payer: Self-pay

## 2024-10-06 ENCOUNTER — Inpatient Hospital Stay: Attending: Oncology

## 2024-10-06 VITALS — BP 115/79 | HR 88 | Temp 98.0°F | Resp 18

## 2024-10-06 DIAGNOSIS — N289 Disorder of kidney and ureter, unspecified: Secondary | ICD-10-CM | POA: Insufficient documentation

## 2024-10-06 DIAGNOSIS — D509 Iron deficiency anemia, unspecified: Secondary | ICD-10-CM | POA: Insufficient documentation

## 2024-10-06 MED ORDER — IRON SUCROSE 20 MG/ML IV SOLN
200.0000 mg | Freq: Once | INTRAVENOUS | Status: AC
  Administered 2024-10-06: 200 mg via INTRAVENOUS
  Filled 2024-10-06: qty 10

## 2024-10-06 NOTE — Patient Instructions (Signed)

## 2024-10-06 NOTE — Progress Notes (Signed)
 Opened in error. Pharmacy reports they found the rx

## 2024-10-06 NOTE — Telephone Encounter (Signed)
 Rexulti  was increased at visit on 10/6 to 1mg  daily

## 2024-10-07 ENCOUNTER — Other Ambulatory Visit: Payer: Self-pay | Admitting: Family Medicine

## 2024-10-07 DIAGNOSIS — K5909 Other constipation: Secondary | ICD-10-CM

## 2024-10-07 MED ORDER — BREXPIPRAZOLE 1 MG PO TABS: 1.0000 mg | ORAL_TABLET | Freq: Every day | ORAL | 1 refills | Status: AC

## 2024-10-07 NOTE — Telephone Encounter (Signed)
 If pt is established on 1mg  therapy, can we submit rx for 1mg  tablet daily to avoid PA?

## 2024-10-08 ENCOUNTER — Other Ambulatory Visit (HOSPITAL_COMMUNITY): Payer: Self-pay

## 2024-10-12 ENCOUNTER — Inpatient Hospital Stay

## 2024-10-12 ENCOUNTER — Other Ambulatory Visit (HOSPITAL_COMMUNITY): Payer: Self-pay

## 2024-10-12 VITALS — BP 127/58 | HR 76 | Temp 97.3°F | Resp 18

## 2024-10-12 DIAGNOSIS — D509 Iron deficiency anemia, unspecified: Secondary | ICD-10-CM

## 2024-10-12 MED ORDER — IRON SUCROSE 20 MG/ML IV SOLN
200.0000 mg | Freq: Once | INTRAVENOUS | Status: AC
  Administered 2024-10-12: 200 mg via INTRAVENOUS
  Filled 2024-10-12: qty 10

## 2024-10-12 MED ORDER — SODIUM CHLORIDE 0.9% FLUSH
10.0000 mL | Freq: Once | INTRAVENOUS | Status: AC | PRN
  Administered 2024-10-12: 10 mL
  Filled 2024-10-12: qty 10

## 2024-10-14 ENCOUNTER — Other Ambulatory Visit: Payer: Self-pay | Admitting: Family Medicine

## 2024-10-15 ENCOUNTER — Telehealth: Payer: Self-pay | Admitting: Family Medicine

## 2024-10-15 NOTE — Telephone Encounter (Unsigned)
 Copied from CRM #[id number]. Topic: Clinical - Medication Refill >> Oct 15, 2024  2:28 PM Tiffini S wrote: Medication: Mounjaro  7.5mg    Has the patient contacted their pharmacy? Yes (Agent: If no, request that the patient contact the pharmacy for the refill. If patient does not wish to contact the pharmacy document the reason why and proceed with request.) (Agent: If yes, when and what did the pharmacy advise?)  This is the patient's preferred pharmacy:  [facility] [place]  [place], [place] - [address] [place] [address] Phone: [phone] Fax: [phone]   Is this the correct pharmacy for this prescription? Yes If no, delete pharmacy and type the correct one.   Has the prescription been filled recently? Yes  Is the patient out of the medication? No, will not have the medication for the month of December  Has the patient been seen for an appointment in the last year OR does the patient have an upcoming appointment? Yes  Can we respond through MyChart? Yes  Agent: Please be advised that Rx refills may take up to 3 business days. We ask that you follow-up with your pharmacy.

## 2024-10-16 NOTE — Telephone Encounter (Signed)
 Requested medication (s) are due for refill today -no  Requested medication (s) are on the active medication list -no  Future visit scheduled -no  Last refill: 06/04/24  Notes to clinic: off protocol- provider review, no longer listed on current medication list  Requested Prescriptions  Pending Prescriptions Disp Refills   MOUNJARO  7.5 MG/0.5ML Pen [Pharmacy Med Name: MOUNJARO  7.5MG /0.5ML 4CT 7.5 Solution Auto-injector] 6 mL 11    Sig: INJECT 7.5 MG SUBCUTANEOUSLY ONCE WEEKLY     Off-Protocol Failed - 10/16/2024  1:57 PM      Failed - Medication not assigned to a protocol, review manually.      Passed - Valid encounter within last 12 months    Recent Outpatient Visits           1 month ago Chronic constipation   [facility] [place], Lauraine SAILOR, DO   1 month ago Encounter for annual physical exam   [place] [facility] [place], Jon HERO, MD   4 months ago Hypertension associated with diabetes [facility])   [place] [facility] Bacigalupo, Jon HERO, MD       Future Appointments             In 10 months MacDiarmid, Glendia, MD [facility] [place]               Requested Prescriptions  Pending Prescriptions Disp Refills   MOUNJARO  7.5 MG/0.5ML Pen [Pharmacy Med Name: MOUNJARO  7.5MG /0.5ML 4CT 7.5 Solution Auto-injector] 6 mL 11    Sig: INJECT 7.5 MG SUBCUTANEOUSLY ONCE WEEKLY     Off-Protocol Failed - 10/16/2024  1:57 PM      Failed - Medication not assigned to a protocol, review manually.      Passed - Valid encounter within last 12 months    Recent Outpatient Visits           1 month ago Chronic constipation   [place] [facility] [place], Lauraine SAILOR, DO   1 month ago Encounter for annual physical exam   [place] [facility] [place], Jon HERO, MD   4 months ago Hypertension associated with diabetes [facility])   [place] [facility] Bacigalupo,  Jon HERO, MD       Future Appointments             In 10 months MacDiarmid, Glendia, MD [facility]

## 2024-10-19 ENCOUNTER — Inpatient Hospital Stay

## 2024-10-19 VITALS — BP 130/65 | HR 75 | Temp 97.8°F | Resp 18

## 2024-10-19 DIAGNOSIS — D509 Iron deficiency anemia, unspecified: Secondary | ICD-10-CM | POA: Diagnosis not present

## 2024-10-19 MED ORDER — IRON SUCROSE 20 MG/ML IV SOLN
200.0000 mg | Freq: Once | INTRAVENOUS | Status: AC
  Administered 2024-10-19: 200 mg via INTRAVENOUS
  Filled 2024-10-19: qty 10

## 2024-10-19 NOTE — Patient Instructions (Signed)

## 2024-10-19 NOTE — Telephone Encounter (Signed)
 From chart review, it seems that patient discontinued her Mounjaro  due to side effects in September.  See this documentation and phone note from 08/07/2024 with Pharm.D.  We do not need to send a refill at this time.  If patient thinks that she is still taking this or has questions, please let us  know.  Looping Allyson into this message chain as well.

## 2024-10-19 NOTE — Telephone Encounter (Signed)
 No answer when called, lvm to regarding phone message left with recommendation by provider.

## 2024-10-26 ENCOUNTER — Ambulatory Visit (INDEPENDENT_AMBULATORY_CARE_PROVIDER_SITE_OTHER): Admitting: Licensed Clinical Social Worker

## 2024-10-26 ENCOUNTER — Telehealth: Payer: Self-pay | Admitting: Family Medicine

## 2024-10-26 DIAGNOSIS — Z91199 Patient's noncompliance with other medical treatment and regimen due to unspecified reason: Secondary | ICD-10-CM

## 2024-10-26 NOTE — Telephone Encounter (Signed)
 Copied from CRM [id number]. Topic: Appointments - Appointment Scheduling >> Oct 26, 2024  2:19 PM Lonell PEDLAR wrote: Patient/patient representative is calling to schedule an appointment. Patient missed apt, would like to r/s, please call [phone] to do so.

## 2024-10-26 NOTE — BH Specialist Note (Signed)
 [facility] Collaborative Care Clinician attempted to connect with patient for scheduled appointment in office. Patient did not show up for the appointment. After waiting 15 minutes, clinician attempted to reach pt by phone to offer virtual visit.   Pt did not answer phone and clinician left HIPAA compliant voicemail for pt to call office to reschedule appointment.

## 2024-10-26 NOTE — Telephone Encounter (Signed)
 [facility] clinician contacted pt and rescheduled appt to 12/19@10am .

## 2024-11-06 ENCOUNTER — Other Ambulatory Visit: Payer: Self-pay | Admitting: Family Medicine

## 2024-11-06 DIAGNOSIS — E1159 Type 2 diabetes mellitus with other circulatory complications: Secondary | ICD-10-CM

## 2024-11-09 ENCOUNTER — Telehealth: Payer: Self-pay | Admitting: Oncology

## 2024-11-09 NOTE — Telephone Encounter (Signed)
 LOV- 09/07/2024 NOV- 12/10/2024 LRF- 06/05/2024 Outpatient Medication Detail   Disp Refills Start End   metFORMIN  (GLUCOPHAGE ) 500 MG tablet 180 tablet 1 06/05/2024 --   Sig: TAKE 1 TABLET BY MOUTH TWICE DAILY *PATIENT MUST KEEP UPCOMING OFFICE VISIT FOR ADDITIONAL REFILLS*   Sent to pharmacy as: metFORMIN  (GLUCOPHAGE ) 500 MG tablet   E-Prescribing Status: Receipt confirmed by pharmacy (06/05/2024  6:11 PM EDT)      06/12/2024 Outpatient Medication Detail   Disp Refills Start End   losartan  (COZAAR ) 25 MG tablet 90 tablet 1 06/12/2024 --   Sig - Route: TAKE ONE TABLET BY MOUTH DAILY - Oral   Sent to pharmacy as: losartan  (COZAAR ) 25 MG tablet   E-Prescribing Status: Receipt confirmed by pharmacy (06/12/2024  8:56 AM EDT)

## 2024-11-09 NOTE — Telephone Encounter (Signed)
 Patient called to check on follow up appointments. Said she was unaware of them being made. She request a call for appointments. Appointments confirmed for 1/26 and 1/27

## 2024-11-13 ENCOUNTER — Telehealth: Payer: Self-pay

## 2024-11-13 NOTE — Telephone Encounter (Unsigned)
 Copied from CRM #[id number]. Topic: Clinical - Medication Question >> Nov 13, 2024 12:54 PM Lauren C wrote: Reason for CRM: Abby with [facility] calling to let us  know that tolterodine  and mezacline can cause confusion and cognitive impairment for age. Recommends stopping or lowering these medications. If unable, they can provide alternatives. Will be faxing us  this information.

## 2024-11-16 NOTE — Telephone Encounter (Signed)
Noted. Will discuss with patient at next visit.

## 2024-11-17 ENCOUNTER — Ambulatory Visit: Payer: Self-pay

## 2024-11-17 NOTE — Telephone Encounter (Signed)
 FYI Only or Action Required?: FYI only for provider: appointment scheduled on 11/18/24.  Patient was last seen in primary care on 09/15/2024 by Donzella Lauraine SAILOR, DO.  Called Nurse Triage reporting Diarrhea.  Symptoms began about a month ago.  Interventions attempted: OTC medications: Imodium.  Symptoms are: unchanged.  Triage Disposition: See HCP Within 4 Hours (Or PCP Triage)  Patient/caregiver understands and will follow disposition?: Yes, but will wait      Reason for Disposition  [1] SEVERE diarrhea (e.g., 7 or more times / day more than normal) AND [2] age > 60 years  Answer Assessment - Initial Assessment Questions Dr. B aware of GI issues, had been constipated.     1. DIARRHEA SEVERITY: How bad is the diarrhea? How many more stools have you had in the past 24 hours than normal?      Several times, now needing incontinence pads. Urgency 2. ONSET: When did the diarrhea begin?      One month 3. STOOL DESCRIPTION:  How loose or watery is the diarrhea? What is the stool color? Is there any blood or mucous in the stool?     mushy 4. VOMITING: Are you also vomiting? If Yes, ask: How many times in the past 24 hours?       5. ABDOMEN PAIN: Are you having any abdomen pain? If Yes, ask: What does it feel like? (e.g., crampy, dull, intermittent, constant)      intermittent 6. ABDOMEN PAIN SEVERITY: If present, ask: How bad is the pain?  (e.g., Scale 1-10; mild, moderate, or severe)      7. ORAL INTAKE: If vomiting, Have you been able to drink liquids? How much liquids have you had in the past 24 hours?     Drinking well 8. HYDRATION: Any signs of dehydration? (e.g., dry mouth [not just dry lips], too weak to stand, dizziness, new weight loss) When did you last urinate?     Hydrated 9. EXPOSURE: Have you traveled to a foreign country recently? Have you been exposed to anyone with diarrhea? Could you have eaten any food that was spoiled?       10. ANTIBIOTIC USE: Are you taking antibiotics now or have you taken antibiotics in the past 2 months?        11. OTHER SYMPTOMS: Do you have any other symptoms? (e.g., fever, blood in stool)       Denies  Protocols used: Diarrhea-A-AH

## 2024-11-17 NOTE — Telephone Encounter (Signed)
 PAS transferred pt call to RN triage. Pt stated she has to go to neurology appt right now and will call us  back when she gets out around 2:30 pm. Placing in callbacks for 2:30 pm if pt does not call back before then.  Copied from CRM #[id number]. Topic: Clinical - Red Word Triage >> Nov 17, 2024 12:49 PM Nathanel BROCKS wrote: Red Word that prompted transfer to Nurse Triage: pt has been having diarrhea for over a month and she is just totally drained and dont know what to do.

## 2024-11-18 ENCOUNTER — Ambulatory Visit: Admitting: Family Medicine

## 2024-11-18 ENCOUNTER — Ambulatory Visit: Payer: Self-pay

## 2024-11-18 ENCOUNTER — Ambulatory Visit

## 2024-11-18 NOTE — Telephone Encounter (Signed)
°  FYI Only or Action Required?: FYI only for provider: appointment scheduled on 12.17.25.  Patient was last seen in primary care on 09/15/2024 by Donzella Lauraine SAILOR, DO.  Called Nurse Triage reporting Diarrhea.  Symptoms began about a month ago.  Interventions attempted: OTC medications: Imodium.  Symptoms are: unchanged.  Triage Disposition: See HCP Within 4 Hours (Or PCP Triage)  Patient/caregiver understands and will follow disposition?: Message from Avram MATSU sent at 11/18/2024 10:43 AM EST  Reason for Triage: patient called in about canceling her appt, she stated she feels like she can't leave due to having a cold and sore throat with a slight fever of 101 [phone]   Reason for Disposition  [1] SEVERE diarrhea (e.g., 7 or more times / day more than normal) AND [2] age > 60 years  Answer Assessment - Initial Assessment Questions 1. DIARRHEA SEVERITY: How bad is the diarrhea? How many more stools have you had in the past 24 hours than normal?  9 times in the last 24 hours       2. ONSET: When did the diarrhea begin?   One month      3. STOOL DESCRIPTION:  How loose or watery is the diarrhea? What is the stool color? Is there any blood or mucous in the stool?     Like pudding  4. VOMITING: Are you also vomiting? If Yes, ask: How many times in the past 24 hours?  denies       5. ABDOMEN PAIN: Are you having any abdomen pain? If Yes, ask: What does it feel like? (e.g., crampy, dull, intermittent, constant)  denies      6. ABDOMEN PAIN SEVERITY: If present, ask: How bad is the pain?  (e.g., Scale 1-10; mild, moderate, or severe) denies      7. ORAL INTAKE: If vomiting, Have you been able to drink liquids? How much liquids have you had in the past 24 hours? Pt states she is drinking fluids      8. HYDRATION: Pt denies dry mouth [not just dry lips], too weak to stand, dizziness, decreased urination        9. EXPOSURE: Have you traveled to a  foreign country recently? Have you been exposed to anyone with diarrhea? Could you have eaten any food that was spoiled? denies      10. ANTIBIOTIC USE: Are you taking antibiotics now or have you taken antibiotics in the past 2 months? denies         11. OTHER SYMPTOMS: Pt denies fever/blood in stool  Pt reports diarrhea and cough Pt is taking OTC Imodium Pt scheduled for a virtual visit on 11/18/24  for further evaluation. Pt is requesting a virtual visit due to c/o of not being able to control bowels. Pt states she had an accident on herself at a prior appt and does not want to come into clinic to possibly do the same. Pt requesting in person visit for today, 12.17.25, to be cancelled. Pt agrees with plan of care, will call back for any worsening symptoms  Protocols used: [facility]

## 2024-11-18 NOTE — Telephone Encounter (Signed)
 Attempted to call pt x1. VM left for pt. Will attempt to contact pt at a later time.  Message from Avram MATSU sent at 11/18/2024 10:43 AM EST  Reason for Triage: patient called in about canceling her appt, she stated she feels like she can't leave due to having a cold and sore throat with a slight fever of [phone]

## 2024-11-18 NOTE — Telephone Encounter (Signed)
 Attempted to call pt x2. VM left for pt. Will attempt to contact pt at a later time.     Message from Avram MATSU sent at 11/18/2024 10:43 AM EST  Reason for Triage: patient called in about canceling her appt, she stated she feels like she can't leave due to having a cold and sore throat with a slight fever of 101 [phone]

## 2024-11-20 ENCOUNTER — Ambulatory Visit (INDEPENDENT_AMBULATORY_CARE_PROVIDER_SITE_OTHER): Admitting: Licensed Clinical Social Worker

## 2024-11-20 DIAGNOSIS — Z91199 Patient's noncompliance with other medical treatment and regimen due to unspecified reason: Secondary | ICD-10-CM

## 2024-11-20 NOTE — BH Specialist Note (Signed)
 [facility] Collaborative Care Clinician attempted to connect with patient for scheduled virtual appointment via phone x 2. Clinician left HIPAA compliant voice mail x 2 to identify reason for call (appointment) and informed pt to call back to reschedule appt if unavailable.    Clinician spent total of 15 minutes attempting to connect with patient for scheduled visit.

## 2024-11-30 ENCOUNTER — Telehealth: Payer: Self-pay | Admitting: *Deleted

## 2024-11-30 NOTE — Telephone Encounter (Signed)
 Patient Left vm stating that she was supposed to come back to the clinic to get more blood work but hasn't heard anything. I reviewed her chart.  She has an apt on 1/26 with labs and to see Dr. Jacobo 1/27 for possible IV iron  infusion. I attempted to reach her back but left vm that her call was being returned.

## 2024-12-02 ENCOUNTER — Ambulatory Visit: Payer: Self-pay

## 2024-12-02 NOTE — Telephone Encounter (Signed)
 Needs appointment

## 2024-12-02 NOTE — Telephone Encounter (Signed)
 FYI Only or Action Required?: Action required by provider: Patient requesting antibiotics.  Patient was last seen in primary care on 09/15/2024 by Donzella Lauraine SAILOR, DO.  Called Nurse Triage reporting Cough.  Symptoms began several weeks ago.  Interventions attempted: OTC medications: Mucinex, alkaseltzer cold and flu.  Symptoms are: stable.  Triage Disposition: Home Care  Patient/caregiver understands and will follow disposition?: Yes  Copied from CRM #[id number]. Topic: Clinical - Red Word Triage >> Dec 02, 2024 10:11 AM Terri G wrote: [facility] that prompted transfer to Nurse Triage: Congested in head and chest, productive cough and coughed up discolored phlegm Reason for Disposition  Cough with cold symptoms (e.g., runny nose, postnasal drip, throat clearing)  Answer Assessment - Initial Assessment Questions Wants antibiotic called in, does not want appointment.  [facility]  1. ONSET: When did the cough begin?      2 weeks ago 3. SPUTUM: Describe the color of your sputum (e.g., none, dry cough; clear, white, yellow, green)     Clear, green/yellow 4. HEMOPTYSIS: Are you coughing up any blood? If Yes, ask: How much? (e.g., flecks, streaks, tablespoons, etc.)     Denies 5. DIFFICULTY BREATHING: Are you having difficulty breathing? If Yes, ask: How bad is it? (e.g., mild, moderate, severe)      Denies 6. FEVER: Do you have a fever? If Yes, ask: What is your temperature, how was it measured, and when did it start?     Denies 7. CARDIAC HISTORY: Do you have any history of heart disease? (e.g., heart attack, congestive heart failure)     CHF 8. LUNG HISTORY: Do you have any history of lung disease?  (e.g., pulmonary embolus, asthma, emphysema)     Denies 9. PE RISK FACTORS: Do you have a history of blood clots? (or: recent major surgery, recent prolonged travel, bedridden)     Denies 10. OTHER SYMPTOMS: Do you have any other symptoms? (e.g.,  runny nose, wheezing, chest pain)      Runny nose 12. TRAVEL: Have you traveled out of the country in the last month? (e.g., travel history, exposures)       Denies  Protocols used: Cough - Acute Productive-A-AH

## 2024-12-07 NOTE — Telephone Encounter (Signed)
 LVMTCB. OK for E2C2 to clarify per Dr.B

## 2024-12-07 NOTE — Telephone Encounter (Signed)
 I will see her on 1/8, but if she is getting worse, I would recommend seeing another provider if they have any appts available sooner than that.

## 2024-12-10 ENCOUNTER — Ambulatory Visit: Admitting: Family Medicine

## 2024-12-25 ENCOUNTER — Telehealth: Payer: Self-pay | Admitting: Oncology

## 2024-12-25 ENCOUNTER — Other Ambulatory Visit: Payer: Self-pay | Admitting: Family Medicine

## 2024-12-25 NOTE — Telephone Encounter (Signed)
 Due to weather clinic will be closed 1/26. I called and spoke with pt and she stated she has the flu and will not be here next week at all. I have confirmed the new appts dates/times with pt

## 2024-12-28 ENCOUNTER — Inpatient Hospital Stay

## 2024-12-29 ENCOUNTER — Inpatient Hospital Stay

## 2024-12-29 ENCOUNTER — Inpatient Hospital Stay: Admitting: Oncology

## 2025-01-01 ENCOUNTER — Other Ambulatory Visit: Payer: Self-pay | Admitting: Family Medicine

## 2025-01-11 ENCOUNTER — Inpatient Hospital Stay: Attending: Oncology

## 2025-01-12 ENCOUNTER — Inpatient Hospital Stay

## 2025-01-12 ENCOUNTER — Inpatient Hospital Stay: Admitting: Oncology

## 2025-06-09 ENCOUNTER — Ambulatory Visit

## 2025-09-06 ENCOUNTER — Ambulatory Visit: Admitting: Urology
